# Patient Record
Sex: Female | Born: 1945 | Race: White | Hispanic: No | Smoking: Never smoker
Health system: Southern US, Community
[De-identification: ages and names within clinical notes are randomized; demographics above are authoritative.]

## PROBLEM LIST (undated history)

## (undated) DIAGNOSIS — I5030 Unspecified diastolic (congestive) heart failure: Secondary | ICD-10-CM

## (undated) DIAGNOSIS — F419 Anxiety disorder, unspecified: Secondary | ICD-10-CM

## (undated) DIAGNOSIS — I10 Essential (primary) hypertension: Secondary | ICD-10-CM

## (undated) DIAGNOSIS — M109 Gout, unspecified: Secondary | ICD-10-CM

## (undated) DIAGNOSIS — E119 Type 2 diabetes mellitus without complications: Secondary | ICD-10-CM

## (undated) DIAGNOSIS — I739 Peripheral vascular disease, unspecified: Secondary | ICD-10-CM

## (undated) DIAGNOSIS — M1990 Unspecified osteoarthritis, unspecified site: Secondary | ICD-10-CM

## (undated) DIAGNOSIS — R519 Headache, unspecified: Secondary | ICD-10-CM

## (undated) DIAGNOSIS — N186 End stage renal disease: Secondary | ICD-10-CM

## (undated) DIAGNOSIS — I499 Cardiac arrhythmia, unspecified: Secondary | ICD-10-CM

## (undated) DIAGNOSIS — D649 Anemia, unspecified: Secondary | ICD-10-CM

## (undated) DIAGNOSIS — I480 Paroxysmal atrial fibrillation: Secondary | ICD-10-CM

## (undated) DIAGNOSIS — C9110 Chronic lymphocytic leukemia of B-cell type not having achieved remission: Secondary | ICD-10-CM

## (undated) DIAGNOSIS — E785 Hyperlipidemia, unspecified: Secondary | ICD-10-CM

## (undated) DIAGNOSIS — H269 Unspecified cataract: Secondary | ICD-10-CM

## (undated) DIAGNOSIS — K219 Gastro-esophageal reflux disease without esophagitis: Secondary | ICD-10-CM

## (undated) DIAGNOSIS — I639 Cerebral infarction, unspecified: Secondary | ICD-10-CM

## (undated) DIAGNOSIS — Z794 Long term (current) use of insulin: Secondary | ICD-10-CM

## (undated) DIAGNOSIS — Z8719 Personal history of other diseases of the digestive system: Secondary | ICD-10-CM

## (undated) DIAGNOSIS — N189 Chronic kidney disease, unspecified: Secondary | ICD-10-CM

## (undated) DIAGNOSIS — J189 Pneumonia, unspecified organism: Secondary | ICD-10-CM

## (undated) DIAGNOSIS — I219 Acute myocardial infarction, unspecified: Secondary | ICD-10-CM

## (undated) DIAGNOSIS — A0472 Enterocolitis due to Clostridium difficile, not specified as recurrent: Secondary | ICD-10-CM

## (undated) DIAGNOSIS — IMO0001 Reserved for inherently not codable concepts without codable children: Secondary | ICD-10-CM

## (undated) DIAGNOSIS — N393 Stress incontinence (female) (male): Secondary | ICD-10-CM

## (undated) DIAGNOSIS — R51 Headache: Secondary | ICD-10-CM

## (undated) DIAGNOSIS — I2510 Atherosclerotic heart disease of native coronary artery without angina pectoris: Secondary | ICD-10-CM

## (undated) DIAGNOSIS — R011 Cardiac murmur, unspecified: Secondary | ICD-10-CM

## (undated) HISTORY — PX: HEEL SPUR EXCISION: SHX1733

## (undated) HISTORY — PX: SHOULDER OPEN ROTATOR CUFF REPAIR: SHX2407

## (undated) HISTORY — DX: Unspecified cataract: H26.9

## (undated) HISTORY — DX: Unspecified diastolic (congestive) heart failure: I50.30

## (undated) HISTORY — PX: ANGIOPLASTY / STENTING ILIAC: SUR31

## (undated) HISTORY — DX: Anxiety disorder, unspecified: F41.9

## (undated) HISTORY — DX: Chronic kidney disease, unspecified: N18.9

## (undated) HISTORY — DX: Atherosclerotic heart disease of native coronary artery without angina pectoris: I25.10

## (undated) HISTORY — DX: Anemia, unspecified: D64.9

## (undated) HISTORY — DX: Paroxysmal atrial fibrillation: I48.0

## (undated) HISTORY — DX: Peripheral vascular disease, unspecified: I73.9

## (undated) HISTORY — PX: CATARACT EXTRACTION W/ INTRAOCULAR LENS  IMPLANT, BILATERAL: SHX1307

## (undated) HISTORY — PX: TUBAL LIGATION: SHX77

## (undated) HISTORY — DX: Chronic lymphocytic leukemia of B-cell type not having achieved remission: C91.10

## (undated) HISTORY — DX: Essential (primary) hypertension: I10

## (undated) HISTORY — DX: Hyperlipidemia, unspecified: E78.5

---
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
## 1991-01-25 HISTORY — PX: CHOLECYSTECTOMY: SHX55

## 1994-10-25 HISTORY — PX: CORONARY ARTERY BYPASS GRAFT: SHX141

## 1997-12-15 ENCOUNTER — Encounter: Admission: RE | Admit: 1997-12-15 | Discharge: 1997-12-15 | Payer: Self-pay | Admitting: Family Medicine

## 1998-01-09 ENCOUNTER — Encounter: Admission: RE | Admit: 1998-01-09 | Discharge: 1998-01-09 | Payer: Self-pay | Admitting: Family Medicine

## 1998-01-30 ENCOUNTER — Encounter: Admission: RE | Admit: 1998-01-30 | Discharge: 1998-01-30 | Payer: Self-pay | Admitting: Family Medicine

## 1998-03-23 ENCOUNTER — Encounter: Admission: RE | Admit: 1998-03-23 | Discharge: 1998-03-23 | Payer: Self-pay | Admitting: Family Medicine

## 1998-07-06 ENCOUNTER — Encounter: Admission: RE | Admit: 1998-07-06 | Discharge: 1998-07-06 | Payer: Self-pay | Admitting: Family Medicine

## 1998-07-14 ENCOUNTER — Encounter: Admission: RE | Admit: 1998-07-14 | Discharge: 1998-07-14 | Payer: Self-pay | Admitting: Family Medicine

## 1998-08-13 ENCOUNTER — Encounter: Admission: RE | Admit: 1998-08-13 | Discharge: 1998-08-13 | Payer: Self-pay | Admitting: Family Medicine

## 1998-09-22 ENCOUNTER — Encounter: Admission: RE | Admit: 1998-09-22 | Discharge: 1998-09-22 | Payer: Self-pay | Admitting: Sports Medicine

## 1998-09-22 ENCOUNTER — Ambulatory Visit (HOSPITAL_COMMUNITY): Admission: RE | Admit: 1998-09-22 | Discharge: 1998-09-22 | Payer: Self-pay | Admitting: Sports Medicine

## 1998-10-26 ENCOUNTER — Encounter: Payer: Self-pay | Admitting: Gastroenterology

## 1998-10-26 ENCOUNTER — Ambulatory Visit (HOSPITAL_COMMUNITY): Admission: RE | Admit: 1998-10-26 | Discharge: 1998-10-26 | Payer: Self-pay | Admitting: Gastroenterology

## 1998-12-23 ENCOUNTER — Encounter: Admission: RE | Admit: 1998-12-23 | Discharge: 1998-12-23 | Payer: Self-pay | Admitting: Family Medicine

## 1999-01-04 ENCOUNTER — Encounter: Payer: Self-pay | Admitting: Gastroenterology

## 1999-01-04 ENCOUNTER — Ambulatory Visit (HOSPITAL_COMMUNITY): Admission: RE | Admit: 1999-01-04 | Discharge: 1999-01-04 | Payer: Self-pay | Admitting: Gastroenterology

## 1999-01-27 ENCOUNTER — Encounter: Admission: RE | Admit: 1999-01-27 | Discharge: 1999-01-27 | Payer: Self-pay | Admitting: Family Medicine

## 1999-01-29 ENCOUNTER — Encounter: Admission: RE | Admit: 1999-01-29 | Discharge: 1999-01-29 | Payer: Self-pay | Admitting: Family Medicine

## 1999-02-08 ENCOUNTER — Encounter: Admission: RE | Admit: 1999-02-08 | Discharge: 1999-02-08 | Payer: Self-pay | Admitting: Family Medicine

## 1999-02-26 ENCOUNTER — Encounter: Admission: RE | Admit: 1999-02-26 | Discharge: 1999-02-26 | Payer: Self-pay | Admitting: Family Medicine

## 1999-03-12 ENCOUNTER — Encounter: Payer: Self-pay | Admitting: Sports Medicine

## 1999-03-12 ENCOUNTER — Encounter: Admission: RE | Admit: 1999-03-12 | Discharge: 1999-03-12 | Payer: Self-pay | Admitting: Family Medicine

## 1999-03-12 ENCOUNTER — Encounter: Admission: RE | Admit: 1999-03-12 | Discharge: 1999-03-12 | Payer: Self-pay | Admitting: Sports Medicine

## 1999-03-30 ENCOUNTER — Encounter: Payer: Self-pay | Admitting: Orthopaedic Surgery

## 1999-03-30 ENCOUNTER — Ambulatory Visit (HOSPITAL_COMMUNITY): Admission: RE | Admit: 1999-03-30 | Discharge: 1999-03-30 | Payer: Self-pay | Admitting: Orthopaedic Surgery

## 1999-12-10 ENCOUNTER — Encounter: Admission: RE | Admit: 1999-12-10 | Discharge: 1999-12-10 | Payer: Self-pay | Admitting: Family Medicine

## 2000-03-14 ENCOUNTER — Encounter: Payer: Self-pay | Admitting: Family Medicine

## 2000-03-14 ENCOUNTER — Encounter: Admission: RE | Admit: 2000-03-14 | Discharge: 2000-03-14 | Payer: Self-pay | Admitting: Family Medicine

## 2000-03-15 ENCOUNTER — Inpatient Hospital Stay (HOSPITAL_COMMUNITY): Admission: EM | Admit: 2000-03-15 | Discharge: 2000-03-16 | Payer: Self-pay | Admitting: Emergency Medicine

## 2000-03-15 ENCOUNTER — Encounter: Payer: Self-pay | Admitting: Emergency Medicine

## 2000-03-24 ENCOUNTER — Encounter: Admission: RE | Admit: 2000-03-24 | Discharge: 2000-03-24 | Payer: Self-pay | Admitting: Family Medicine

## 2000-06-05 ENCOUNTER — Encounter: Admission: RE | Admit: 2000-06-05 | Discharge: 2000-06-05 | Payer: Self-pay | Admitting: Family Medicine

## 2000-08-04 ENCOUNTER — Encounter: Admission: RE | Admit: 2000-08-04 | Discharge: 2000-11-02 | Payer: Self-pay | Admitting: Orthopedic Surgery

## 2000-10-02 ENCOUNTER — Encounter: Admission: RE | Admit: 2000-10-02 | Discharge: 2000-10-02 | Payer: Self-pay | Admitting: Family Medicine

## 2000-11-03 ENCOUNTER — Encounter: Admission: RE | Admit: 2000-11-03 | Discharge: 2000-11-14 | Payer: Self-pay | Admitting: Orthopedic Surgery

## 2000-11-20 ENCOUNTER — Encounter: Admission: RE | Admit: 2000-11-20 | Discharge: 2000-11-20 | Payer: Self-pay | Admitting: Family Medicine

## 2000-11-28 ENCOUNTER — Encounter: Admission: RE | Admit: 2000-11-28 | Discharge: 2000-11-28 | Payer: Self-pay | Admitting: Sports Medicine

## 2000-12-05 ENCOUNTER — Encounter: Admission: RE | Admit: 2000-12-05 | Discharge: 2000-12-05 | Payer: Self-pay | Admitting: Family Medicine

## 2000-12-12 ENCOUNTER — Encounter: Admission: RE | Admit: 2000-12-12 | Discharge: 2000-12-12 | Payer: Self-pay | Admitting: Family Medicine

## 2001-04-02 ENCOUNTER — Encounter: Admission: RE | Admit: 2001-04-02 | Discharge: 2001-04-02 | Payer: Self-pay | Admitting: Family Medicine

## 2001-04-10 ENCOUNTER — Encounter: Admission: RE | Admit: 2001-04-10 | Discharge: 2001-06-15 | Payer: Self-pay | Admitting: Orthopedic Surgery

## 2001-04-13 ENCOUNTER — Encounter: Admission: RE | Admit: 2001-04-13 | Discharge: 2001-04-13 | Payer: Self-pay | Admitting: Family Medicine

## 2001-04-27 ENCOUNTER — Encounter: Admission: RE | Admit: 2001-04-27 | Discharge: 2001-04-27 | Payer: Self-pay | Admitting: Family Medicine

## 2001-04-27 ENCOUNTER — Encounter: Payer: Self-pay | Admitting: Sports Medicine

## 2001-11-19 ENCOUNTER — Encounter: Admission: RE | Admit: 2001-11-19 | Discharge: 2001-11-19 | Payer: Self-pay | Admitting: Family Medicine

## 2001-11-26 ENCOUNTER — Encounter: Admission: RE | Admit: 2001-11-26 | Discharge: 2001-11-26 | Payer: Self-pay | Admitting: Family Medicine

## 2002-02-14 ENCOUNTER — Encounter: Admission: RE | Admit: 2002-02-14 | Discharge: 2002-02-14 | Payer: Self-pay | Admitting: Family Medicine

## 2002-02-21 ENCOUNTER — Encounter: Admission: RE | Admit: 2002-02-21 | Discharge: 2002-02-21 | Payer: Self-pay | Admitting: Family Medicine

## 2002-02-28 ENCOUNTER — Encounter: Admission: RE | Admit: 2002-02-28 | Discharge: 2002-02-28 | Payer: Self-pay | Admitting: Family Medicine

## 2002-03-08 ENCOUNTER — Encounter: Admission: RE | Admit: 2002-03-08 | Discharge: 2002-03-08 | Payer: Self-pay | Admitting: Family Medicine

## 2002-03-15 ENCOUNTER — Encounter: Admission: RE | Admit: 2002-03-15 | Discharge: 2002-03-15 | Payer: Self-pay | Admitting: Family Medicine

## 2002-05-13 ENCOUNTER — Encounter: Admission: RE | Admit: 2002-05-13 | Discharge: 2002-05-13 | Payer: Self-pay | Admitting: Family Medicine

## 2002-06-25 ENCOUNTER — Ambulatory Visit (HOSPITAL_COMMUNITY): Admission: EM | Admit: 2002-06-25 | Discharge: 2002-06-25 | Payer: Self-pay | Admitting: Emergency Medicine

## 2002-08-01 ENCOUNTER — Encounter: Admission: RE | Admit: 2002-08-01 | Discharge: 2002-08-01 | Payer: Self-pay | Admitting: Family Medicine

## 2002-08-26 ENCOUNTER — Encounter: Admission: RE | Admit: 2002-08-26 | Discharge: 2002-08-26 | Payer: Self-pay | Admitting: Sports Medicine

## 2002-08-28 ENCOUNTER — Encounter: Admission: RE | Admit: 2002-08-28 | Discharge: 2002-08-28 | Payer: Self-pay | Admitting: Sports Medicine

## 2002-08-28 ENCOUNTER — Encounter: Payer: Self-pay | Admitting: Sports Medicine

## 2002-09-03 ENCOUNTER — Encounter: Admission: RE | Admit: 2002-09-03 | Discharge: 2002-09-03 | Payer: Self-pay | Admitting: Family Medicine

## 2002-09-27 ENCOUNTER — Encounter: Admission: RE | Admit: 2002-09-27 | Discharge: 2002-09-27 | Payer: Self-pay | Admitting: Family Medicine

## 2002-11-19 ENCOUNTER — Encounter: Admission: RE | Admit: 2002-11-19 | Discharge: 2002-11-19 | Payer: Self-pay | Admitting: Sports Medicine

## 2002-12-23 ENCOUNTER — Encounter: Admission: RE | Admit: 2002-12-23 | Discharge: 2002-12-23 | Payer: Self-pay | Admitting: Sports Medicine

## 2002-12-23 ENCOUNTER — Encounter: Payer: Self-pay | Admitting: Family Medicine

## 2003-01-07 ENCOUNTER — Encounter: Admission: RE | Admit: 2003-01-07 | Discharge: 2003-01-07 | Payer: Self-pay | Admitting: Sports Medicine

## 2003-04-25 ENCOUNTER — Encounter: Admission: RE | Admit: 2003-04-25 | Discharge: 2003-04-25 | Payer: Self-pay | Admitting: Family Medicine

## 2003-08-18 ENCOUNTER — Encounter: Admission: RE | Admit: 2003-08-18 | Discharge: 2003-08-18 | Payer: Self-pay | Admitting: Family Medicine

## 2003-11-10 ENCOUNTER — Ambulatory Visit: Payer: Self-pay | Admitting: Sports Medicine

## 2004-04-20 ENCOUNTER — Ambulatory Visit: Payer: Self-pay | Admitting: Oncology

## 2004-04-26 ENCOUNTER — Ambulatory Visit: Payer: Self-pay | Admitting: Family Medicine

## 2004-05-06 ENCOUNTER — Ambulatory Visit: Payer: Self-pay | Admitting: Family Medicine

## 2004-05-11 ENCOUNTER — Ambulatory Visit: Payer: Self-pay | Admitting: Family Medicine

## 2004-05-18 ENCOUNTER — Encounter: Admission: RE | Admit: 2004-05-18 | Discharge: 2004-05-18 | Payer: Self-pay | Admitting: Family Medicine

## 2004-05-18 ENCOUNTER — Ambulatory Visit: Payer: Self-pay | Admitting: Family Medicine

## 2005-03-18 ENCOUNTER — Ambulatory Visit: Payer: Self-pay | Admitting: Family Medicine

## 2005-09-24 ENCOUNTER — Encounter (INDEPENDENT_AMBULATORY_CARE_PROVIDER_SITE_OTHER): Payer: Self-pay | Admitting: *Deleted

## 2005-10-13 ENCOUNTER — Encounter: Admission: RE | Admit: 2005-10-13 | Discharge: 2005-10-13 | Payer: Self-pay | Admitting: Family Medicine

## 2005-10-17 ENCOUNTER — Encounter: Payer: Self-pay | Admitting: Family Medicine

## 2005-10-17 ENCOUNTER — Ambulatory Visit: Payer: Self-pay | Admitting: Family Medicine

## 2005-10-18 ENCOUNTER — Encounter: Payer: Self-pay | Admitting: Family Medicine

## 2005-10-18 LAB — CONVERTED CEMR LAB
BUN: 42 mg/dL
CO2: 23 meq/L
Chloride: 107 meq/L
Creatinine, Ser: 1.7 mg/dL

## 2005-10-19 ENCOUNTER — Ambulatory Visit: Payer: Self-pay | Admitting: Family Medicine

## 2005-11-21 ENCOUNTER — Ambulatory Visit: Payer: Self-pay | Admitting: Family Medicine

## 2005-12-21 ENCOUNTER — Emergency Department (HOSPITAL_COMMUNITY): Admission: EM | Admit: 2005-12-21 | Discharge: 2005-12-21 | Payer: Self-pay | Admitting: Family Medicine

## 2006-03-23 DIAGNOSIS — K589 Irritable bowel syndrome without diarrhea: Secondary | ICD-10-CM | POA: Insufficient documentation

## 2006-03-23 DIAGNOSIS — M159 Polyosteoarthritis, unspecified: Secondary | ICD-10-CM | POA: Insufficient documentation

## 2006-03-23 DIAGNOSIS — E7800 Pure hypercholesterolemia, unspecified: Secondary | ICD-10-CM | POA: Insufficient documentation

## 2006-03-23 DIAGNOSIS — N819 Female genital prolapse, unspecified: Secondary | ICD-10-CM | POA: Insufficient documentation

## 2006-03-23 DIAGNOSIS — I10 Essential (primary) hypertension: Secondary | ICD-10-CM | POA: Insufficient documentation

## 2006-03-23 DIAGNOSIS — K2100 Gastro-esophageal reflux disease with esophagitis, without bleeding: Secondary | ICD-10-CM | POA: Insufficient documentation

## 2006-03-23 DIAGNOSIS — E118 Type 2 diabetes mellitus with unspecified complications: Secondary | ICD-10-CM | POA: Insufficient documentation

## 2006-03-23 DIAGNOSIS — I2510 Atherosclerotic heart disease of native coronary artery without angina pectoris: Secondary | ICD-10-CM | POA: Insufficient documentation

## 2006-03-23 DIAGNOSIS — E669 Obesity, unspecified: Secondary | ICD-10-CM | POA: Insufficient documentation

## 2006-03-24 ENCOUNTER — Encounter (INDEPENDENT_AMBULATORY_CARE_PROVIDER_SITE_OTHER): Payer: Self-pay | Admitting: *Deleted

## 2006-10-13 ENCOUNTER — Ambulatory Visit: Payer: Self-pay | Admitting: Family Medicine

## 2006-10-13 DIAGNOSIS — C9110 Chronic lymphocytic leukemia of B-cell type not having achieved remission: Secondary | ICD-10-CM

## 2006-10-13 HISTORY — DX: Chronic lymphocytic leukemia of B-cell type not having achieved remission: C91.10

## 2006-10-16 LAB — CONVERTED CEMR LAB
HCT: 33.6 % — ABNORMAL LOW (ref 36.0–46.0)
Hemoglobin: 10.3 g/dL — ABNORMAL LOW (ref 12.0–15.0)
MCV: 89.8 fL (ref 78.0–100.0)
Platelets: 378 10*3/uL (ref 150–400)
RDW: 16.8 % — ABNORMAL HIGH (ref 11.5–14.0)
WBC: 66.5 10*3/uL (ref 4.0–10.5)

## 2006-10-18 ENCOUNTER — Encounter: Admission: RE | Admit: 2006-10-18 | Discharge: 2006-10-18 | Payer: Self-pay | Admitting: Family Medicine

## 2006-10-20 ENCOUNTER — Encounter: Payer: Self-pay | Admitting: Family Medicine

## 2006-11-16 ENCOUNTER — Ambulatory Visit: Payer: Self-pay | Admitting: Family Medicine

## 2007-01-19 ENCOUNTER — Telehealth: Payer: Self-pay | Admitting: Family Medicine

## 2007-05-30 ENCOUNTER — Telehealth: Payer: Self-pay | Admitting: Family Medicine

## 2007-06-21 ENCOUNTER — Telehealth: Payer: Self-pay | Admitting: *Deleted

## 2007-06-22 ENCOUNTER — Encounter: Payer: Self-pay | Admitting: *Deleted

## 2007-06-22 ENCOUNTER — Ambulatory Visit: Payer: Self-pay | Admitting: Family Medicine

## 2007-06-22 LAB — CONVERTED CEMR LAB
Casts: >20 /lpf
Ketones, urine, test strip: NEGATIVE
Nitrite: NEGATIVE
Protein, U semiquant: >=300
Urobilinogen, UA: 0.2

## 2007-06-27 ENCOUNTER — Encounter: Payer: Self-pay | Admitting: Family Medicine

## 2007-06-27 ENCOUNTER — Ambulatory Visit: Payer: Self-pay | Admitting: Family Medicine

## 2007-06-27 LAB — CONVERTED CEMR LAB
CO2: 19 meq/L (ref 19–32)
Calcium: 8.9 mg/dL (ref 8.4–10.5)
Chloride: 110 meq/L (ref 96–112)
Glucose, Bld: 176 mg/dL — ABNORMAL HIGH (ref 70–99)
MCV: 90.6 fL (ref 78.0–100.0)
Platelets: 363 10*3/uL (ref 150–400)
Potassium: 6.1 meq/L — ABNORMAL HIGH (ref 3.5–5.3)
RBC: 3.6 M/uL — ABNORMAL LOW (ref 3.87–5.11)
Sodium: 139 meq/L (ref 135–145)
WBC: 64.3 10*3/uL (ref 4.0–10.5)

## 2007-06-28 ENCOUNTER — Encounter: Payer: Self-pay | Admitting: Family Medicine

## 2007-06-28 ENCOUNTER — Telehealth: Payer: Self-pay | Admitting: Family Medicine

## 2007-07-13 ENCOUNTER — Encounter: Admission: RE | Admit: 2007-07-13 | Discharge: 2007-07-13 | Payer: Self-pay | Admitting: Family Medicine

## 2007-07-13 ENCOUNTER — Ambulatory Visit: Payer: Self-pay | Admitting: Family Medicine

## 2007-07-13 DIAGNOSIS — Z992 Dependence on renal dialysis: Secondary | ICD-10-CM

## 2007-07-13 DIAGNOSIS — N186 End stage renal disease: Secondary | ICD-10-CM | POA: Insufficient documentation

## 2007-10-24 ENCOUNTER — Encounter: Admission: RE | Admit: 2007-10-24 | Discharge: 2007-10-24 | Payer: Self-pay | Admitting: Family Medicine

## 2007-10-26 ENCOUNTER — Ambulatory Visit: Payer: Self-pay | Admitting: Family Medicine

## 2007-11-05 ENCOUNTER — Encounter: Payer: Self-pay | Admitting: Family Medicine

## 2007-11-26 ENCOUNTER — Encounter: Payer: Self-pay | Admitting: Family Medicine

## 2007-11-26 LAB — CONVERTED CEMR LAB: HDL: 40 mg/dL

## 2007-12-31 ENCOUNTER — Encounter: Payer: Self-pay | Admitting: Family Medicine

## 2008-02-05 ENCOUNTER — Emergency Department (HOSPITAL_COMMUNITY): Admission: EM | Admit: 2008-02-05 | Discharge: 2008-02-05 | Payer: Self-pay | Admitting: *Deleted

## 2008-02-11 ENCOUNTER — Telehealth (INDEPENDENT_AMBULATORY_CARE_PROVIDER_SITE_OTHER): Payer: Self-pay | Admitting: *Deleted

## 2008-02-11 ENCOUNTER — Telehealth: Payer: Self-pay | Admitting: Family Medicine

## 2008-05-12 ENCOUNTER — Telehealth (INDEPENDENT_AMBULATORY_CARE_PROVIDER_SITE_OTHER): Payer: Self-pay | Admitting: *Deleted

## 2008-05-15 ENCOUNTER — Encounter: Payer: Self-pay | Admitting: Family Medicine

## 2008-05-15 LAB — CONVERTED CEMR LAB
HCT: 29.5 %
Lymphocytes Relative: 83.8 %
Lymphs Abs: 70.7 10*3/uL
Neutro Abs: 5.7 10*3/uL
Neutrophils Relative %: 6.7 %
Platelets: 328 10*3/uL
RDW: 15.9 %
WBC: 84.4 10*3/uL

## 2008-05-22 ENCOUNTER — Encounter: Payer: Self-pay | Admitting: Family Medicine

## 2008-05-22 ENCOUNTER — Ambulatory Visit (HOSPITAL_COMMUNITY): Admission: RE | Admit: 2008-05-22 | Discharge: 2008-05-22 | Payer: Self-pay | Admitting: Interventional Cardiology

## 2008-05-29 ENCOUNTER — Ambulatory Visit (HOSPITAL_COMMUNITY): Admission: RE | Admit: 2008-05-29 | Discharge: 2008-05-29 | Payer: Self-pay | Admitting: Interventional Cardiology

## 2008-06-18 ENCOUNTER — Ambulatory Visit: Payer: Self-pay | Admitting: Family Medicine

## 2008-06-18 DIAGNOSIS — I739 Peripheral vascular disease, unspecified: Secondary | ICD-10-CM | POA: Insufficient documentation

## 2008-06-19 ENCOUNTER — Encounter: Payer: Self-pay | Admitting: Family Medicine

## 2008-06-19 ENCOUNTER — Telehealth: Payer: Self-pay | Admitting: Family Medicine

## 2008-06-20 LAB — CONVERTED CEMR LAB
Alkaline Phosphatase: 71 units/L (ref 39–117)
BUN: 34 mg/dL — ABNORMAL HIGH (ref 6–23)
CO2: 20 meq/L (ref 19–32)
Glucose, Bld: 154 mg/dL — ABNORMAL HIGH (ref 70–99)
HCT: 29.9 % — ABNORMAL LOW (ref 36.0–46.0)
Hemoglobin: 8.7 g/dL — ABNORMAL LOW (ref 12.0–15.0)
MCHC: 29.1 g/dL — ABNORMAL LOW (ref 30.0–36.0)
MCV: 88.5 fL (ref 78.0–100.0)
RBC: 3.38 M/uL — ABNORMAL LOW (ref 3.87–5.11)
TSH: 2.095 microintl units/mL (ref 0.350–4.500)
Total Bilirubin: 0.2 mg/dL — ABNORMAL LOW (ref 0.3–1.2)
Total Protein: 6 g/dL (ref 6.0–8.3)
WBC: 64.9 10*3/uL (ref 4.0–10.5)

## 2008-07-08 ENCOUNTER — Telehealth: Payer: Self-pay | Admitting: Family Medicine

## 2008-10-13 ENCOUNTER — Telehealth: Payer: Self-pay | Admitting: Family Medicine

## 2008-10-13 ENCOUNTER — Ambulatory Visit: Payer: Self-pay | Admitting: Family Medicine

## 2008-10-13 ENCOUNTER — Encounter: Payer: Self-pay | Admitting: Family Medicine

## 2008-10-13 LAB — CONVERTED CEMR LAB
Bilirubin Urine: NEGATIVE
Blood in Urine, dipstick: NEGATIVE
Glucose, Urine, Semiquant: NEGATIVE
Hgb A1c MFr Bld: 7.5 %
Ketones, urine, test strip: NEGATIVE
Nitrite: NEGATIVE
Protein, U semiquant: >=300
Specific Gravity, Urine: 1.025
Urobilinogen, UA: 0.2
pH: 5

## 2008-10-14 LAB — CONVERTED CEMR LAB
BUN: 32 mg/dL — ABNORMAL HIGH (ref 6–23)
CO2: 21 meq/L (ref 19–32)
Calcium: 9 mg/dL (ref 8.4–10.5)
Chloride: 108 meq/L (ref 96–112)
Creatinine, Ser: 1.46 mg/dL — ABNORMAL HIGH (ref 0.40–1.20)
Glucose, Bld: 190 mg/dL — ABNORMAL HIGH (ref 70–99)
HCT: 31.7 % — ABNORMAL LOW (ref 36.0–46.0)
Hemoglobin: 9.5 g/dL — ABNORMAL LOW (ref 12.0–15.0)
MCHC: 30 g/dL (ref 30.0–36.0)
MCV: 88.1 fL (ref 78.0–100.0)
Platelets: 349 10*3/uL (ref 150–400)
Potassium: 5 meq/L (ref 3.5–5.3)
RBC: 3.6 M/uL — ABNORMAL LOW (ref 3.87–5.11)
RDW: 17.8 % — ABNORMAL HIGH (ref 11.5–15.5)
Sodium: 142 meq/L (ref 135–145)
WBC: 63.8 10*3/uL (ref 4.0–10.5)

## 2008-10-15 ENCOUNTER — Encounter: Admission: RE | Admit: 2008-10-15 | Discharge: 2008-10-15 | Payer: Self-pay | Admitting: Family Medicine

## 2008-10-15 ENCOUNTER — Ambulatory Visit (HOSPITAL_COMMUNITY): Admission: RE | Admit: 2008-10-15 | Discharge: 2008-10-15 | Payer: Self-pay | Admitting: Family Medicine

## 2008-10-15 ENCOUNTER — Ambulatory Visit: Payer: Self-pay | Admitting: Family Medicine

## 2008-10-16 ENCOUNTER — Telehealth: Payer: Self-pay | Admitting: Family Medicine

## 2008-10-17 ENCOUNTER — Telehealth: Payer: Self-pay | Admitting: Family Medicine

## 2008-10-22 ENCOUNTER — Ambulatory Visit: Payer: Self-pay | Admitting: Family Medicine

## 2008-10-24 ENCOUNTER — Telehealth (INDEPENDENT_AMBULATORY_CARE_PROVIDER_SITE_OTHER): Payer: Self-pay | Admitting: *Deleted

## 2008-10-24 ENCOUNTER — Encounter: Payer: Self-pay | Admitting: Family Medicine

## 2008-10-24 LAB — CONVERTED CEMR LAB
ALT: 20 units/L (ref 0–35)
AST: 24 units/L (ref 0–37)
Alkaline Phosphatase: 84 units/L (ref 39–117)
Basophils Absolute: 0.6 10*3/uL — ABNORMAL HIGH (ref 0.0–0.1)
Basophils Relative: 1 % (ref 0–1)
Glucose, Bld: 248 mg/dL — ABNORMAL HIGH (ref 70–99)
Lymphocytes Relative: 79 % — ABNORMAL HIGH (ref 12–46)
MCHC: 30.5 g/dL (ref 30.0–36.0)
Neutro Abs: 12 10*3/uL — ABNORMAL HIGH (ref 1.7–7.7)
Neutrophils Relative %: 19 % — ABNORMAL LOW (ref 43–77)
Platelets: 316 10*3/uL (ref 150–400)
RDW: 17.6 % — ABNORMAL HIGH (ref 11.5–15.5)
Sodium: 141 meq/L (ref 135–145)
Total Bilirubin: 0.2 mg/dL — ABNORMAL LOW (ref 0.3–1.2)
Total Protein: 5.5 g/dL — ABNORMAL LOW (ref 6.0–8.3)

## 2008-10-29 ENCOUNTER — Telehealth: Payer: Self-pay | Admitting: Family Medicine

## 2008-10-29 ENCOUNTER — Ambulatory Visit: Payer: Self-pay | Admitting: Family Medicine

## 2008-10-29 DIAGNOSIS — I495 Sick sinus syndrome: Secondary | ICD-10-CM | POA: Insufficient documentation

## 2008-11-04 ENCOUNTER — Ambulatory Visit (HOSPITAL_COMMUNITY): Admission: RE | Admit: 2008-11-04 | Discharge: 2008-11-04 | Payer: Self-pay | Admitting: Family Medicine

## 2008-11-04 ENCOUNTER — Encounter: Payer: Self-pay | Admitting: Family Medicine

## 2008-11-04 ENCOUNTER — Telehealth (INDEPENDENT_AMBULATORY_CARE_PROVIDER_SITE_OTHER): Payer: Self-pay | Admitting: *Deleted

## 2008-11-07 ENCOUNTER — Telehealth: Payer: Self-pay | Admitting: Family Medicine

## 2008-11-07 ENCOUNTER — Ambulatory Visit (HOSPITAL_COMMUNITY): Admission: RE | Admit: 2008-11-07 | Discharge: 2008-11-07 | Payer: Self-pay | Admitting: Family Medicine

## 2008-11-11 ENCOUNTER — Ambulatory Visit: Payer: Self-pay | Admitting: Family Medicine

## 2008-11-11 ENCOUNTER — Encounter: Payer: Self-pay | Admitting: Family Medicine

## 2008-11-11 ENCOUNTER — Telehealth: Payer: Self-pay | Admitting: Family Medicine

## 2008-11-11 LAB — CONVERTED CEMR LAB
Alkaline Phosphatase: 99 units/L (ref 39–117)
BUN: 23 mg/dL (ref 6–23)
Glucose, Bld: 288 mg/dL — ABNORMAL HIGH (ref 70–99)
MCHC: 28.7 g/dL — ABNORMAL LOW (ref 30.0–36.0)
MCV: 87.6 fL (ref 78.0–100.0)
RBC: 3.7 M/uL — ABNORMAL LOW (ref 3.87–5.11)
RDW: 17.3 % — ABNORMAL HIGH (ref 11.5–15.5)
Sodium: 138 meq/L (ref 135–145)
Total Bilirubin: 0.2 mg/dL — ABNORMAL LOW (ref 0.3–1.2)
Total Protein: 5.9 g/dL — ABNORMAL LOW (ref 6.0–8.3)

## 2008-11-12 ENCOUNTER — Telehealth: Payer: Self-pay | Admitting: Family Medicine

## 2008-11-12 ENCOUNTER — Encounter: Payer: Self-pay | Admitting: Family Medicine

## 2008-11-17 ENCOUNTER — Ambulatory Visit: Payer: Self-pay | Admitting: Family Medicine

## 2008-11-26 ENCOUNTER — Encounter: Admission: RE | Admit: 2008-11-26 | Discharge: 2008-11-26 | Payer: Self-pay | Admitting: Otolaryngology

## 2008-11-26 ENCOUNTER — Encounter: Payer: Self-pay | Admitting: Family Medicine

## 2008-12-03 ENCOUNTER — Telehealth: Payer: Self-pay | Admitting: Family Medicine

## 2008-12-04 ENCOUNTER — Ambulatory Visit: Payer: Self-pay | Admitting: Family Medicine

## 2008-12-05 LAB — CONVERTED CEMR LAB
CO2: 20 meq/L (ref 19–32)
Cholesterol: 107 mg/dL (ref 0–200)
Creatinine, Ser: 1.7 mg/dL — ABNORMAL HIGH (ref 0.40–1.20)
Glucose, Bld: 192 mg/dL — ABNORMAL HIGH (ref 70–99)
HCT: 30.2 % — ABNORMAL LOW (ref 36.0–46.0)
LDL Cholesterol: 42 mg/dL (ref 0–99)
MCV: 88.3 fL (ref 78.0–100.0)
RBC: 3.42 M/uL — ABNORMAL LOW (ref 3.87–5.11)
Total Bilirubin: 0.2 mg/dL — ABNORMAL LOW (ref 0.3–1.2)
Triglycerides: 157 mg/dL — ABNORMAL HIGH (ref <150)
WBC: 52.6 10*3/uL (ref 4.0–10.5)

## 2008-12-12 ENCOUNTER — Encounter: Admission: RE | Admit: 2008-12-12 | Discharge: 2008-12-12 | Payer: Self-pay | Admitting: Family Medicine

## 2008-12-15 ENCOUNTER — Encounter: Payer: Self-pay | Admitting: Family Medicine

## 2008-12-22 ENCOUNTER — Telehealth: Payer: Self-pay | Admitting: Family Medicine

## 2008-12-23 ENCOUNTER — Telehealth: Payer: Self-pay | Admitting: Family Medicine

## 2008-12-24 ENCOUNTER — Ambulatory Visit: Payer: Self-pay | Admitting: Family Medicine

## 2008-12-24 LAB — CONVERTED CEMR LAB: Hgb A1c MFr Bld: 9.7 %

## 2008-12-25 ENCOUNTER — Telehealth: Payer: Self-pay | Admitting: Family Medicine

## 2008-12-25 ENCOUNTER — Ambulatory Visit: Payer: Self-pay | Admitting: Family Medicine

## 2008-12-25 ENCOUNTER — Encounter: Payer: Self-pay | Admitting: Family Medicine

## 2008-12-25 LAB — CONVERTED CEMR LAB
BUN: 29 mg/dL — ABNORMAL HIGH (ref 6–23)
CO2: 21 meq/L (ref 19–32)
Chloride: 100 meq/L (ref 96–112)
Creatinine, Ser: 1.34 mg/dL — ABNORMAL HIGH (ref 0.40–1.20)
HCT: 29.3 % — ABNORMAL LOW (ref 36.0–46.0)
Hemoglobin: 8.6 g/dL — ABNORMAL LOW (ref 12.0–15.0)
Potassium: 4.9 meq/L (ref 3.5–5.3)
RBC: 3.43 M/uL — ABNORMAL LOW (ref 3.87–5.11)
RDW: 17.2 % — ABNORMAL HIGH (ref 11.5–15.5)
WBC: 100.5 10*3/uL (ref 4.0–10.5)

## 2008-12-31 ENCOUNTER — Telehealth: Payer: Self-pay | Admitting: Family Medicine

## 2009-01-01 ENCOUNTER — Encounter: Payer: Self-pay | Admitting: Family Medicine

## 2009-01-01 ENCOUNTER — Telehealth (INDEPENDENT_AMBULATORY_CARE_PROVIDER_SITE_OTHER): Payer: Self-pay | Admitting: *Deleted

## 2009-01-01 ENCOUNTER — Telehealth: Payer: Self-pay | Admitting: *Deleted

## 2009-01-05 ENCOUNTER — Encounter: Payer: Self-pay | Admitting: Family Medicine

## 2009-01-05 ENCOUNTER — Telehealth: Payer: Self-pay | Admitting: Family Medicine

## 2009-01-07 ENCOUNTER — Inpatient Hospital Stay (HOSPITAL_COMMUNITY): Admission: AD | Admit: 2009-01-07 | Discharge: 2009-01-11 | Payer: Self-pay | Admitting: Family Medicine

## 2009-01-07 ENCOUNTER — Encounter: Payer: Self-pay | Admitting: Family Medicine

## 2009-01-07 ENCOUNTER — Ambulatory Visit: Payer: Self-pay | Admitting: Family Medicine

## 2009-01-08 ENCOUNTER — Ambulatory Visit: Payer: Self-pay | Admitting: Vascular Surgery

## 2009-01-08 ENCOUNTER — Ambulatory Visit: Payer: Self-pay | Admitting: Oncology

## 2009-01-08 ENCOUNTER — Encounter: Payer: Self-pay | Admitting: Family Medicine

## 2009-01-09 ENCOUNTER — Encounter: Payer: Self-pay | Admitting: Family Medicine

## 2009-01-10 ENCOUNTER — Encounter: Payer: Self-pay | Admitting: Family Medicine

## 2009-01-12 ENCOUNTER — Telehealth: Payer: Self-pay | Admitting: Family Medicine

## 2009-01-13 ENCOUNTER — Ambulatory Visit: Payer: Self-pay | Admitting: Family Medicine

## 2009-01-15 ENCOUNTER — Encounter: Payer: Self-pay | Admitting: Family Medicine

## 2009-01-15 LAB — CBC WITH DIFFERENTIAL/PLATELET
MCH: 26.6 pg (ref 25.1–34.0)
MCHC: 30.2 g/dL — ABNORMAL LOW (ref 31.5–36.0)
MCV: 88.1 fL (ref 79.5–101.0)
Platelets: 357 10*3/uL (ref 145–400)
RBC: 3.23 10*6/uL — ABNORMAL LOW (ref 3.70–5.45)
RDW: 20.5 % — ABNORMAL HIGH (ref 11.2–14.5)
WBC: 99 10*3/uL (ref 3.9–10.3)

## 2009-01-15 LAB — MANUAL DIFFERENTIAL
Basophil: 0 % (ref 0–2)
EOS: 0 % (ref 0–7)
LYMPH: 92 % — ABNORMAL HIGH (ref 14–49)
MONO: 1 % (ref 0–14)
Metamyelocytes: 0 % (ref 0–0)
Myelocytes: 0 % (ref 0–0)
PLT EST: ADEQUATE
Variant Lymph: 0 % (ref 0–0)

## 2009-01-16 LAB — COMPREHENSIVE METABOLIC PANEL
ALT: 19 U/L (ref 0–35)
Alkaline Phosphatase: 92 U/L (ref 39–117)
CO2: 25 mEq/L (ref 19–32)
Creatinine, Ser: 2.39 mg/dL — ABNORMAL HIGH (ref 0.40–1.20)
Sodium: 140 mEq/L (ref 135–145)
Total Bilirubin: 0.2 mg/dL — ABNORMAL LOW (ref 0.3–1.2)

## 2009-01-16 LAB — VITAMIN B12: Vitamin B-12: 724 pg/mL (ref 211–911)

## 2009-01-16 LAB — IRON AND TIBC
%SAT: 11 % — ABNORMAL LOW (ref 20–55)
TIBC: 485 ug/dL — ABNORMAL HIGH (ref 250–470)

## 2009-01-16 LAB — DIRECT ANTIGLOBULIN TEST (NOT AT ARMC): DAT (Complement): NEGATIVE

## 2009-01-16 LAB — LACTATE DEHYDROGENASE: LDH: 222 U/L (ref 94–250)

## 2009-01-21 ENCOUNTER — Telehealth: Payer: Self-pay | Admitting: Family Medicine

## 2009-01-30 ENCOUNTER — Ambulatory Visit (HOSPITAL_COMMUNITY): Admission: RE | Admit: 2009-01-30 | Discharge: 2009-01-30 | Payer: Self-pay | Admitting: Oncology

## 2009-02-06 ENCOUNTER — Encounter (INDEPENDENT_AMBULATORY_CARE_PROVIDER_SITE_OTHER): Payer: Self-pay | Admitting: *Deleted

## 2009-02-12 ENCOUNTER — Encounter: Payer: Self-pay | Admitting: *Deleted

## 2009-02-17 ENCOUNTER — Encounter: Payer: Self-pay | Admitting: Family Medicine

## 2009-02-17 ENCOUNTER — Ambulatory Visit: Payer: Self-pay | Admitting: Oncology

## 2009-02-18 ENCOUNTER — Ambulatory Visit: Payer: Self-pay | Admitting: Family Medicine

## 2009-02-18 DIAGNOSIS — I5022 Chronic systolic (congestive) heart failure: Secondary | ICD-10-CM | POA: Insufficient documentation

## 2009-02-25 LAB — COMPREHENSIVE METABOLIC PANEL
CO2: 23 mEq/L (ref 19–32)
Creatinine, Ser: 2 mg/dL — ABNORMAL HIGH (ref 0.40–1.20)
Glucose, Bld: 188 mg/dL — ABNORMAL HIGH (ref 70–99)
Sodium: 140 mEq/L (ref 135–145)
Total Bilirubin: 0.3 mg/dL (ref 0.3–1.2)
Total Protein: 5.9 g/dL — ABNORMAL LOW (ref 6.0–8.3)

## 2009-02-25 LAB — MANUAL DIFFERENTIAL
ALC: 57.9 10*3/uL — ABNORMAL HIGH (ref 0.9–3.3)
Band Neutrophils: 0 % (ref 0–10)
LYMPH: 87 % — ABNORMAL HIGH (ref 14–49)
MONO: 2 % (ref 0–14)
Other Cell: 0 % (ref 0–0)
SEG: 7 % — ABNORMAL LOW (ref 38–77)
Variant Lymph: 0 % (ref 0–0)
nRBC: 0 % (ref 0–0)

## 2009-02-25 LAB — CBC WITH DIFFERENTIAL/PLATELET
HCT: 26.9 % — ABNORMAL LOW (ref 34.8–46.6)
Platelets: 244 10*3/uL (ref 145–400)
WBC: 66.6 10*3/uL (ref 3.9–10.3)

## 2009-02-25 LAB — LACTATE DEHYDROGENASE: LDH: 192 U/L (ref 94–250)

## 2009-02-25 LAB — URIC ACID: Uric Acid, Serum: 4.5 mg/dL (ref 2.4–7.0)

## 2009-03-02 LAB — COMPREHENSIVE METABOLIC PANEL
ALT: 14 U/L (ref 0–35)
AST: 23 U/L (ref 0–37)
Albumin: 3.9 g/dL (ref 3.5–5.2)
Alkaline Phosphatase: 87 U/L (ref 39–117)
Glucose, Bld: 125 mg/dL — ABNORMAL HIGH (ref 70–99)
Potassium: 5.2 mEq/L (ref 3.5–5.3)
Sodium: 143 mEq/L (ref 135–145)
Total Protein: 6.1 g/dL (ref 6.0–8.3)

## 2009-03-02 LAB — CBC WITH DIFFERENTIAL/PLATELET
EOS%: 1.2 % (ref 0.0–7.0)
Eosinophils Absolute: 0.8 10*3/uL — ABNORMAL HIGH (ref 0.0–0.5)
MCV: 87.5 fL (ref 79.5–101.0)
MONO%: 9.4 % (ref 0.0–14.0)
NEUT#: 4.8 10*3/uL (ref 1.5–6.5)
RBC: 3.16 10*6/uL — ABNORMAL LOW (ref 3.70–5.45)
RDW: 20.9 % — ABNORMAL HIGH (ref 11.2–14.5)

## 2009-03-06 ENCOUNTER — Encounter: Payer: Self-pay | Admitting: Family Medicine

## 2009-03-09 LAB — CBC WITH DIFFERENTIAL/PLATELET
HCT: 28.4 % — ABNORMAL LOW (ref 34.8–46.6)
HGB: 8.8 g/dL — ABNORMAL LOW (ref 11.6–15.9)
RDW: 21 % — ABNORMAL HIGH (ref 11.2–14.5)
WBC: 74.7 10*3/uL (ref 3.9–10.3)

## 2009-03-09 LAB — COMPREHENSIVE METABOLIC PANEL
AST: 22 U/L (ref 0–37)
Albumin: 4.1 g/dL (ref 3.5–5.2)
Alkaline Phosphatase: 87 U/L (ref 39–117)
Chloride: 107 mEq/L (ref 96–112)
Glucose, Bld: 147 mg/dL — ABNORMAL HIGH (ref 70–99)
Potassium: 5.3 mEq/L (ref 3.5–5.3)
Sodium: 141 mEq/L (ref 135–145)
Total Protein: 6.1 g/dL (ref 6.0–8.3)

## 2009-03-09 LAB — MANUAL DIFFERENTIAL
ALC: 68.7 10*3/uL — ABNORMAL HIGH (ref 0.9–3.3)
ANC (CHCC manual diff): 2.2 10*3/uL (ref 1.5–6.5)
Band Neutrophils: 0 % (ref 0–10)
Basophil: 1 % (ref 0–2)
Blasts: 0 % (ref 0–0)
Other Cell: 0 % (ref 0–0)
PROMYELO: 0 % (ref 0–0)
SEG: 3 % — ABNORMAL LOW (ref 38–77)
Variant Lymph: 0 % (ref 0–0)
nRBC: 0 % (ref 0–0)

## 2009-03-09 LAB — URIC ACID: Uric Acid, Serum: 3.6 mg/dL (ref 2.4–7.0)

## 2009-03-16 LAB — MANUAL DIFFERENTIAL
Band Neutrophils: 0 % (ref 0–10)
Basophil: 0 % (ref 0–2)
Blasts: 0 % (ref 0–0)
EOS: 0 % (ref 0–7)
LYMPH: 96 % — ABNORMAL HIGH (ref 14–49)
Metamyelocytes: 0 % (ref 0–0)
Other Cell: 0 % (ref 0–0)
PROMYELO: 0 % (ref 0–0)
nRBC: 0 % (ref 0–0)

## 2009-03-16 LAB — CBC WITH DIFFERENTIAL/PLATELET
HCT: 27 % — ABNORMAL LOW (ref 34.8–46.6)
HGB: 8.1 g/dL — ABNORMAL LOW (ref 11.6–15.9)
MCH: 26.5 pg (ref 25.1–34.0)
MCHC: 30 g/dL — ABNORMAL LOW (ref 31.5–36.0)
MCV: 88.2 fL (ref 79.5–101.0)
Platelets: 241 10*3/uL (ref 145–400)
RBC: 3.06 10*6/uL — ABNORMAL LOW (ref 3.70–5.45)
RDW: 19.4 % — ABNORMAL HIGH (ref 11.2–14.5)
WBC: 71.9 10*3/uL (ref 3.9–10.3)
nRBC: 0 % (ref 0–0)

## 2009-03-16 LAB — COMPREHENSIVE METABOLIC PANEL
CO2: 23 mEq/L (ref 19–32)
Chloride: 108 mEq/L (ref 96–112)
Creatinine, Ser: 2.13 mg/dL — ABNORMAL HIGH (ref 0.40–1.20)
Glucose, Bld: 153 mg/dL — ABNORMAL HIGH (ref 70–99)
Sodium: 140 mEq/L (ref 135–145)
Total Bilirubin: 0.3 mg/dL (ref 0.3–1.2)
Total Protein: 5.9 g/dL — ABNORMAL LOW (ref 6.0–8.3)

## 2009-03-16 LAB — URIC ACID: Uric Acid, Serum: 3.7 mg/dL (ref 2.4–7.0)

## 2009-03-23 ENCOUNTER — Encounter: Payer: Self-pay | Admitting: Family Medicine

## 2009-03-23 ENCOUNTER — Ambulatory Visit: Payer: Self-pay | Admitting: Oncology

## 2009-03-23 LAB — COMPREHENSIVE METABOLIC PANEL
Albumin: 4.3 g/dL (ref 3.5–5.2)
Alkaline Phosphatase: 81 U/L (ref 39–117)
BUN: 38 mg/dL — ABNORMAL HIGH (ref 6–23)
Calcium: 9 mg/dL (ref 8.4–10.5)
Chloride: 109 mEq/L (ref 96–112)
Creatinine, Ser: 2.03 mg/dL — ABNORMAL HIGH (ref 0.40–1.20)
Glucose, Bld: 130 mg/dL — ABNORMAL HIGH (ref 70–99)
Potassium: 5.4 mEq/L — ABNORMAL HIGH (ref 3.5–5.3)

## 2009-03-23 LAB — CBC WITH DIFFERENTIAL/PLATELET
HCT: 28.1 % — ABNORMAL LOW (ref 34.8–46.6)
HGB: 8.8 g/dL — ABNORMAL LOW (ref 11.6–15.9)
MCH: 28 pg (ref 25.1–34.0)
MCHC: 31.5 g/dL (ref 31.5–36.0)
MCV: 89.1 fL (ref 79.5–101.0)
Platelets: 278 10*3/uL (ref 145–400)

## 2009-03-23 LAB — MANUAL DIFFERENTIAL
ANC (CHCC manual diff): 5.7 10*3/uL (ref 1.5–6.5)
Band Neutrophils: 0 % (ref 0–10)
Basophil: 0 % (ref 0–2)
Blasts: 0 % (ref 0–0)
EOS: 1 % (ref 0–7)
LYMPH: 90 % — ABNORMAL HIGH (ref 14–49)
MONO: 1 % (ref 0–14)
Metamyelocytes: 0 % (ref 0–0)
nRBC: 0 % (ref 0–0)

## 2009-03-23 LAB — URIC ACID: Uric Acid, Serum: 4.1 mg/dL (ref 2.4–7.0)

## 2009-04-06 LAB — COMPREHENSIVE METABOLIC PANEL
CO2: 23 mEq/L (ref 19–32)
Calcium: 9.3 mg/dL (ref 8.4–10.5)
Creatinine, Ser: 1.8 mg/dL — ABNORMAL HIGH (ref 0.40–1.20)
Glucose, Bld: 129 mg/dL — ABNORMAL HIGH (ref 70–99)
Total Bilirubin: 0.3 mg/dL (ref 0.3–1.2)
Total Protein: 6.4 g/dL (ref 6.0–8.3)

## 2009-04-06 LAB — CBC WITH DIFFERENTIAL/PLATELET
Basophils Absolute: 0.3 10*3/uL — ABNORMAL HIGH (ref 0.0–0.1)
Eosinophils Absolute: 0.6 10*3/uL — ABNORMAL HIGH (ref 0.0–0.5)
HCT: 28.4 % — ABNORMAL LOW (ref 34.8–46.6)
HGB: 9.1 g/dL — ABNORMAL LOW (ref 11.6–15.9)
LYMPH%: 88 % — ABNORMAL HIGH (ref 14.0–49.7)
MCV: 88.7 fL (ref 79.5–101.0)
MONO%: 3.2 % (ref 0.0–14.0)
NEUT#: 4 10*3/uL (ref 1.5–6.5)
Platelets: 248 10*3/uL (ref 145–400)

## 2009-04-06 LAB — URIC ACID: Uric Acid, Serum: 3.4 mg/dL (ref 2.4–7.0)

## 2009-04-17 ENCOUNTER — Ambulatory Visit: Payer: Self-pay | Admitting: Family Medicine

## 2009-04-17 LAB — CBC WITH DIFFERENTIAL/PLATELET
BASO%: 0.2 % (ref 0.0–2.0)
HCT: 28.7 % — ABNORMAL LOW (ref 34.8–46.6)
MCHC: 31.7 g/dL (ref 31.5–36.0)
MONO#: 1.8 10*3/uL — ABNORMAL HIGH (ref 0.1–0.9)
NEUT%: 6.2 % — ABNORMAL LOW (ref 38.4–76.8)
RDW: 19.7 % — ABNORMAL HIGH (ref 11.2–14.5)
WBC: 52.3 10*3/uL (ref 3.9–10.3)
lymph#: 46.7 10*3/uL — ABNORMAL HIGH (ref 0.9–3.3)

## 2009-04-17 LAB — COMPREHENSIVE METABOLIC PANEL
ALT: 18 U/L (ref 0–35)
AST: 25 U/L (ref 0–37)
Albumin: 4.3 g/dL (ref 3.5–5.2)
Alkaline Phosphatase: 78 U/L (ref 39–117)
BUN: 43 mg/dL — ABNORMAL HIGH (ref 6–23)
Calcium: 9.2 mg/dL (ref 8.4–10.5)
Chloride: 105 mEq/L (ref 96–112)
Potassium: 5.1 mEq/L (ref 3.5–5.3)
Sodium: 139 mEq/L (ref 135–145)
Total Protein: 6.3 g/dL (ref 6.0–8.3)

## 2009-04-17 LAB — TECHNOLOGIST REVIEW

## 2009-04-28 ENCOUNTER — Ambulatory Visit: Payer: Self-pay | Admitting: Oncology

## 2009-04-30 LAB — CBC WITH DIFFERENTIAL/PLATELET
BASO%: 0.2 % (ref 0.0–2.0)
EOS%: 0.9 % (ref 0.0–7.0)
MCH: 28.8 pg (ref 25.1–34.0)
MCHC: 32.2 g/dL (ref 31.5–36.0)
MCV: 89.4 fL (ref 79.5–101.0)
MONO%: 3.2 % (ref 0.0–14.0)
RBC: 3.12 10*6/uL — ABNORMAL LOW (ref 3.70–5.45)
RDW: 18.7 % — ABNORMAL HIGH (ref 11.2–14.5)

## 2009-04-30 LAB — COMPREHENSIVE METABOLIC PANEL
ALT: 18 U/L (ref 0–35)
AST: 25 U/L (ref 0–37)
Albumin: 4.4 g/dL (ref 3.5–5.2)
Alkaline Phosphatase: 74 U/L (ref 39–117)
BUN: 39 mg/dL — ABNORMAL HIGH (ref 6–23)
Potassium: 5.2 mEq/L (ref 3.5–5.3)
Sodium: 142 mEq/L (ref 135–145)

## 2009-05-14 LAB — CBC WITH DIFFERENTIAL/PLATELET
Basophils Absolute: 0.2 10*3/uL — ABNORMAL HIGH (ref 0.0–0.1)
EOS%: 0.6 % (ref 0.0–7.0)
HGB: 9.2 g/dL — ABNORMAL LOW (ref 11.6–15.9)
MCH: 29.2 pg (ref 25.1–34.0)
MCV: 89.6 fL (ref 79.5–101.0)
MONO%: 7.2 % (ref 0.0–14.0)
NEUT%: 6 % — ABNORMAL LOW (ref 38.4–76.8)
RDW: 18.3 % — ABNORMAL HIGH (ref 11.2–14.5)

## 2009-05-14 LAB — COMPREHENSIVE METABOLIC PANEL WITH GFR
ALT: 16 U/L (ref 0–35)
AST: 23 U/L (ref 0–37)
Albumin: 4.1 g/dL (ref 3.5–5.2)
Alkaline Phosphatase: 74 U/L (ref 39–117)
BUN: 50 mg/dL — ABNORMAL HIGH (ref 6–23)
CO2: 24 meq/L (ref 19–32)
Calcium: 9.3 mg/dL (ref 8.4–10.5)
Chloride: 107 meq/L (ref 96–112)
Creatinine, Ser: 2.12 mg/dL — ABNORMAL HIGH (ref 0.40–1.20)
Glucose, Bld: 112 mg/dL — ABNORMAL HIGH (ref 70–99)
Potassium: 4.9 meq/L (ref 3.5–5.3)
Sodium: 141 meq/L (ref 135–145)
Total Bilirubin: 0.3 mg/dL (ref 0.3–1.2)
Total Protein: 6.1 g/dL (ref 6.0–8.3)

## 2009-05-18 ENCOUNTER — Encounter: Payer: Self-pay | Admitting: Family Medicine

## 2009-05-28 ENCOUNTER — Telehealth: Payer: Self-pay | Admitting: *Deleted

## 2009-05-29 ENCOUNTER — Encounter: Payer: Self-pay | Admitting: Family Medicine

## 2009-05-29 ENCOUNTER — Ambulatory Visit: Payer: Self-pay | Admitting: Oncology

## 2009-06-01 LAB — CBC WITH DIFFERENTIAL/PLATELET
Basophils Absolute: 0.1 10*3/uL (ref 0.0–0.1)
EOS%: 0.8 % (ref 0.0–7.0)
Eosinophils Absolute: 0.3 10*3/uL (ref 0.0–0.5)
HCT: 28.2 % — ABNORMAL LOW (ref 34.8–46.6)
HGB: 9.1 g/dL — ABNORMAL LOW (ref 11.6–15.9)
MCH: 29.2 pg (ref 25.1–34.0)
MONO#: 2.2 10*3/uL — ABNORMAL HIGH (ref 0.1–0.9)
NEUT#: 3 10*3/uL (ref 1.5–6.5)
NEUT%: 7 % — ABNORMAL LOW (ref 38.4–76.8)
RDW: 18.3 % — ABNORMAL HIGH (ref 11.2–14.5)
WBC: 42.6 10*3/uL — ABNORMAL HIGH (ref 3.9–10.3)
lymph#: 37 10*3/uL — ABNORMAL HIGH (ref 0.9–3.3)

## 2009-06-01 LAB — COMPREHENSIVE METABOLIC PANEL
Albumin: 4.2 g/dL (ref 3.5–5.2)
Alkaline Phosphatase: 81 U/L (ref 39–117)
BUN: 53 mg/dL — ABNORMAL HIGH (ref 6–23)
Calcium: 9.1 mg/dL (ref 8.4–10.5)
Chloride: 107 mEq/L (ref 96–112)
Glucose, Bld: 96 mg/dL (ref 70–99)
Potassium: 5.2 mEq/L (ref 3.5–5.3)
Sodium: 141 mEq/L (ref 135–145)
Total Protein: 6.2 g/dL (ref 6.0–8.3)

## 2009-06-11 LAB — CBC WITH DIFFERENTIAL/PLATELET
EOS%: 0.8 % (ref 0.0–7.0)
Eosinophils Absolute: 0.3 10*3/uL (ref 0.0–0.5)
MCH: 29.4 pg (ref 25.1–34.0)
MCV: 90.9 fL (ref 79.5–101.0)
MONO%: 4.3 % (ref 0.0–14.0)
NEUT#: 2.9 10*3/uL (ref 1.5–6.5)
RBC: 3.12 10*6/uL — ABNORMAL LOW (ref 3.70–5.45)
RDW: 17.4 % — ABNORMAL HIGH (ref 11.2–14.5)
lymph#: 35.9 10*3/uL — ABNORMAL HIGH (ref 0.9–3.3)

## 2009-06-11 LAB — COMPREHENSIVE METABOLIC PANEL
AST: 23 U/L (ref 0–37)
Albumin: 4.2 g/dL (ref 3.5–5.2)
Alkaline Phosphatase: 72 U/L (ref 39–117)
Chloride: 105 mEq/L (ref 96–112)
Potassium: 4.9 mEq/L (ref 3.5–5.3)
Sodium: 139 mEq/L (ref 135–145)
Total Protein: 6.2 g/dL (ref 6.0–8.3)

## 2009-06-12 ENCOUNTER — Encounter: Payer: Self-pay | Admitting: Family Medicine

## 2009-06-25 LAB — CBC WITH DIFFERENTIAL/PLATELET
Eosinophils Absolute: 0.4 10*3/uL (ref 0.0–0.5)
LYMPH%: 88.9 % — ABNORMAL HIGH (ref 14.0–49.7)
MONO#: 1.5 10*3/uL — ABNORMAL HIGH (ref 0.1–0.9)
NEUT#: 2.4 10*3/uL (ref 1.5–6.5)
Platelets: 239 10*3/uL (ref 145–400)
RBC: 2.98 10*6/uL — ABNORMAL LOW (ref 3.70–5.45)
RDW: 18.1 % — ABNORMAL HIGH (ref 11.2–14.5)
WBC: 39.7 10*3/uL — ABNORMAL HIGH (ref 3.9–10.3)
lymph#: 35.3 10*3/uL — ABNORMAL HIGH (ref 0.9–3.3)

## 2009-06-25 LAB — COMPREHENSIVE METABOLIC PANEL
Albumin: 4 g/dL (ref 3.5–5.2)
CO2: 21 mEq/L (ref 19–32)
Calcium: 9 mg/dL (ref 8.4–10.5)
Chloride: 109 mEq/L (ref 96–112)
Glucose, Bld: 187 mg/dL — ABNORMAL HIGH (ref 70–99)
Potassium: 4.6 mEq/L (ref 3.5–5.3)
Sodium: 143 mEq/L (ref 135–145)
Total Protein: 5.9 g/dL — ABNORMAL LOW (ref 6.0–8.3)

## 2009-07-07 ENCOUNTER — Ambulatory Visit: Payer: Self-pay | Admitting: Oncology

## 2009-07-08 ENCOUNTER — Ambulatory Visit: Payer: Self-pay | Admitting: Family Medicine

## 2009-07-08 LAB — CONVERTED CEMR LAB: Hgb A1c MFr Bld: 7 %

## 2009-07-09 LAB — COMPREHENSIVE METABOLIC PANEL
ALT: 20 U/L (ref 0–35)
Albumin: 4.3 g/dL (ref 3.5–5.2)
Alkaline Phosphatase: 81 U/L (ref 39–117)
CO2: 21 mEq/L (ref 19–32)
Glucose, Bld: 126 mg/dL — ABNORMAL HIGH (ref 70–99)
Potassium: 5.1 mEq/L (ref 3.5–5.3)
Sodium: 141 mEq/L (ref 135–145)
Total Protein: 6.3 g/dL (ref 6.0–8.3)

## 2009-07-09 LAB — CBC WITH DIFFERENTIAL/PLATELET
BASO%: 0 % (ref 0.0–2.0)
Eosinophils Absolute: 0.3 10*3/uL (ref 0.0–0.5)
MCHC: 32.8 g/dL (ref 31.5–36.0)
MONO#: 1.2 10*3/uL — ABNORMAL HIGH (ref 0.1–0.9)
NEUT#: 4.3 10*3/uL (ref 1.5–6.5)
RBC: 3.18 10*6/uL — ABNORMAL LOW (ref 3.70–5.45)
RDW: 17.3 % — ABNORMAL HIGH (ref 11.2–14.5)
WBC: 38 10*3/uL — ABNORMAL HIGH (ref 3.9–10.3)

## 2009-07-09 LAB — LACTATE DEHYDROGENASE: LDH: 206 U/L (ref 94–250)

## 2009-07-10 ENCOUNTER — Encounter: Payer: Self-pay | Admitting: Family Medicine

## 2009-07-20 ENCOUNTER — Telehealth: Payer: Self-pay | Admitting: Family Medicine

## 2009-07-23 LAB — CBC WITH DIFFERENTIAL/PLATELET
BASO%: 0.3 % (ref 0.0–2.0)
EOS%: 1.1 % (ref 0.0–7.0)
HCT: 28.5 % — ABNORMAL LOW (ref 34.8–46.6)
LYMPH%: 84.2 % — ABNORMAL HIGH (ref 14.0–49.7)
MCH: 30.4 pg (ref 25.1–34.0)
MCHC: 33.4 g/dL (ref 31.5–36.0)
MONO#: 1.3 10*3/uL — ABNORMAL HIGH (ref 0.1–0.9)
NEUT%: 10 % — ABNORMAL LOW (ref 38.4–76.8)
Platelets: 237 10*3/uL (ref 145–400)
RBC: 3.13 10*6/uL — ABNORMAL LOW (ref 3.70–5.45)
WBC: 29.4 10*3/uL — ABNORMAL HIGH (ref 3.9–10.3)

## 2009-07-23 LAB — BASIC METABOLIC PANEL
CO2: 21 mEq/L (ref 19–32)
Calcium: 9.4 mg/dL (ref 8.4–10.5)
Creatinine, Ser: 1.98 mg/dL — ABNORMAL HIGH (ref 0.40–1.20)
Sodium: 143 mEq/L (ref 135–145)

## 2009-08-06 ENCOUNTER — Ambulatory Visit: Payer: Self-pay | Admitting: Oncology

## 2009-08-06 LAB — CBC WITH DIFFERENTIAL/PLATELET
Basophils Absolute: 0.1 10*3/uL (ref 0.0–0.1)
Eosinophils Absolute: 0.3 10*3/uL (ref 0.0–0.5)
HGB: 9.5 g/dL — ABNORMAL LOW (ref 11.6–15.9)
LYMPH%: 84.2 % — ABNORMAL HIGH (ref 14.0–49.7)
MCV: 92 fL (ref 79.5–101.0)
MONO%: 4.3 % (ref 0.0–14.0)
NEUT#: 2.8 10*3/uL (ref 1.5–6.5)
Platelets: 232 10*3/uL (ref 145–400)

## 2009-08-06 LAB — COMPREHENSIVE METABOLIC PANEL
Albumin: 4.3 g/dL (ref 3.5–5.2)
Alkaline Phosphatase: 64 U/L (ref 39–117)
BUN: 62 mg/dL — ABNORMAL HIGH (ref 6–23)
Glucose, Bld: 111 mg/dL — ABNORMAL HIGH (ref 70–99)
Potassium: 5.3 mEq/L (ref 3.5–5.3)
Total Bilirubin: 0.3 mg/dL (ref 0.3–1.2)

## 2009-08-10 ENCOUNTER — Encounter: Payer: Self-pay | Admitting: Family Medicine

## 2009-08-11 ENCOUNTER — Telehealth: Payer: Self-pay | Admitting: Family Medicine

## 2009-08-31 LAB — CBC WITH DIFFERENTIAL/PLATELET
Basophils Absolute: 0.1 10*3/uL (ref 0.0–0.1)
EOS%: 1 % (ref 0.0–7.0)
HCT: 30 % — ABNORMAL LOW (ref 34.8–46.6)
HGB: 10.1 g/dL — ABNORMAL LOW (ref 11.6–15.9)
MCH: 31.1 pg (ref 25.1–34.0)
MCV: 92.8 fL (ref 79.5–101.0)
MONO%: 5.1 % (ref 0.0–14.0)
NEUT%: 11.8 % — ABNORMAL LOW (ref 38.4–76.8)
RDW: 16.7 % — ABNORMAL HIGH (ref 11.2–14.5)

## 2009-08-31 LAB — COMPREHENSIVE METABOLIC PANEL
AST: 23 U/L (ref 0–37)
Alkaline Phosphatase: 65 U/L (ref 39–117)
BUN: 54 mg/dL — ABNORMAL HIGH (ref 6–23)
Creatinine, Ser: 1.94 mg/dL — ABNORMAL HIGH (ref 0.40–1.20)

## 2009-09-29 ENCOUNTER — Ambulatory Visit: Payer: Self-pay | Admitting: Oncology

## 2009-09-29 LAB — CBC WITH DIFFERENTIAL/PLATELET
Basophils Absolute: 0.1 10*3/uL (ref 0.0–0.1)
Eosinophils Absolute: 0.3 10*3/uL (ref 0.0–0.5)
HCT: 30.2 % — ABNORMAL LOW (ref 34.8–46.6)
HGB: 9.9 g/dL — ABNORMAL LOW (ref 11.6–15.9)
LYMPH%: 73.6 % — ABNORMAL HIGH (ref 14.0–49.7)
MCV: 93.2 fL (ref 79.5–101.0)
MONO#: 1 10*3/uL — ABNORMAL HIGH (ref 0.1–0.9)
MONO%: 5 % (ref 0.0–14.0)
NEUT#: 3.9 10*3/uL (ref 1.5–6.5)
Platelets: 247 10*3/uL (ref 145–400)

## 2009-09-29 LAB — COMPREHENSIVE METABOLIC PANEL
ALT: 13 U/L (ref 0–35)
Alkaline Phosphatase: 73 U/L (ref 39–117)
BUN: 52 mg/dL — ABNORMAL HIGH (ref 6–23)
CO2: 28 mEq/L (ref 19–32)
Calcium: 9.3 mg/dL (ref 8.4–10.5)
Glucose, Bld: 91 mg/dL (ref 70–99)
Total Bilirubin: 0.3 mg/dL (ref 0.3–1.2)
Total Protein: 6.4 g/dL (ref 6.0–8.3)

## 2009-10-02 ENCOUNTER — Encounter: Payer: Self-pay | Admitting: Family Medicine

## 2009-10-21 ENCOUNTER — Ambulatory Visit: Payer: Self-pay | Admitting: Family Medicine

## 2009-10-26 LAB — COMPREHENSIVE METABOLIC PANEL
ALT: 18 U/L (ref 0–35)
AST: 25 U/L (ref 0–37)
Albumin: 4.2 g/dL (ref 3.5–5.2)
Alkaline Phosphatase: 67 U/L (ref 39–117)
Potassium: 5.4 mEq/L — ABNORMAL HIGH (ref 3.5–5.3)
Sodium: 140 mEq/L (ref 135–145)
Total Protein: 6.1 g/dL (ref 6.0–8.3)

## 2009-10-26 LAB — CBC WITH DIFFERENTIAL/PLATELET
EOS%: 2 % (ref 0.0–7.0)
MCH: 31 pg (ref 25.1–34.0)
MCV: 93.9 fL (ref 79.5–101.0)
MONO%: 5.2 % (ref 0.0–14.0)
NEUT#: 3.1 10*3/uL (ref 1.5–6.5)
RBC: 3.05 10*6/uL — ABNORMAL LOW (ref 3.70–5.45)
RDW: 16.2 % — ABNORMAL HIGH (ref 11.2–14.5)

## 2009-11-19 ENCOUNTER — Ambulatory Visit: Payer: Self-pay | Admitting: Oncology

## 2009-11-23 ENCOUNTER — Encounter: Payer: Self-pay | Admitting: Family Medicine

## 2009-11-23 LAB — CBC WITH DIFFERENTIAL/PLATELET
Eosinophils Absolute: 0.2 10*3/uL (ref 0.0–0.5)
LYMPH%: 73.8 % — ABNORMAL HIGH (ref 14.0–49.7)
MCHC: 31.8 g/dL (ref 31.5–36.0)
MCV: 95 fL (ref 79.5–101.0)
MONO%: 6.5 % (ref 0.0–14.0)
NEUT#: 2.7 10*3/uL (ref 1.5–6.5)
Platelets: 204 10*3/uL (ref 145–400)
RBC: 3.18 10*6/uL — ABNORMAL LOW (ref 3.70–5.45)

## 2009-11-23 LAB — CONVERTED CEMR LAB
AST: 26 units/L
Albumin: 4 g/dL
Alkaline Phosphatase: 78 units/L
Calcium: 9.1 mg/dL
Chloride: 110 meq/L
Glucose, Bld: 122 mg/dL
Potassium: 6 meq/L
Sodium: 139 meq/L
Total Protein: 5.9 g/dL

## 2009-11-23 LAB — COMPREHENSIVE METABOLIC PANEL
ALT: 18 U/L (ref 0–35)
CO2: 21 mEq/L (ref 19–32)
Creatinine, Ser: 1.74 mg/dL — ABNORMAL HIGH (ref 0.40–1.20)
Total Bilirubin: 0.3 mg/dL (ref 0.3–1.2)

## 2009-11-23 LAB — MAGNESIUM: Magnesium: 1.6 mg/dL (ref 1.5–2.5)

## 2009-11-23 LAB — TECHNOLOGIST REVIEW

## 2009-11-25 ENCOUNTER — Encounter: Payer: Self-pay | Admitting: Family Medicine

## 2009-11-30 ENCOUNTER — Telehealth: Payer: Self-pay | Admitting: Family Medicine

## 2009-11-30 LAB — BASIC METABOLIC PANEL
CO2: 27 mEq/L (ref 19–32)
Calcium: 9 mg/dL (ref 8.4–10.5)
Creatinine, Ser: 1.66 mg/dL — ABNORMAL HIGH (ref 0.40–1.20)

## 2009-12-04 LAB — BASIC METABOLIC PANEL
Chloride: 111 mEq/L (ref 96–112)
Creatinine, Ser: 1.8 mg/dL — ABNORMAL HIGH (ref 0.40–1.20)
Potassium: 5.4 mEq/L — ABNORMAL HIGH (ref 3.5–5.3)
Sodium: 144 mEq/L (ref 135–145)

## 2009-12-16 ENCOUNTER — Ambulatory Visit: Payer: Self-pay | Admitting: Oncology

## 2009-12-21 LAB — CBC WITH DIFFERENTIAL/PLATELET
Eosinophils Absolute: 0.2 10*3/uL (ref 0.0–0.5)
HCT: 28.1 % — ABNORMAL LOW (ref 34.8–46.6)
LYMPH%: 70.1 % — ABNORMAL HIGH (ref 14.0–49.7)
MCV: 94.6 fL (ref 79.5–101.0)
MONO#: 0.8 10*3/uL (ref 0.1–0.9)
MONO%: 6.4 % (ref 0.0–14.0)
NEUT#: 2.5 10*3/uL (ref 1.5–6.5)
NEUT%: 21.5 % — ABNORMAL LOW (ref 38.4–76.8)
Platelets: 225 10*3/uL (ref 145–400)
RBC: 2.97 10*6/uL — ABNORMAL LOW (ref 3.70–5.45)
WBC: 11.7 10*3/uL — ABNORMAL HIGH (ref 3.9–10.3)

## 2009-12-21 LAB — COMPREHENSIVE METABOLIC PANEL
Alkaline Phosphatase: 72 U/L (ref 39–117)
BUN: 48 mg/dL — ABNORMAL HIGH (ref 6–23)
CO2: 24 mEq/L (ref 19–32)
Creatinine, Ser: 2.03 mg/dL — ABNORMAL HIGH (ref 0.40–1.20)
Glucose, Bld: 92 mg/dL (ref 70–99)
Total Bilirubin: 0.3 mg/dL (ref 0.3–1.2)

## 2009-12-21 LAB — MAGNESIUM: Magnesium: 1.7 mg/dL (ref 1.5–2.5)

## 2009-12-23 ENCOUNTER — Telehealth: Payer: Self-pay | Admitting: Family Medicine

## 2009-12-30 ENCOUNTER — Ambulatory Visit: Payer: Self-pay | Admitting: Family Medicine

## 2009-12-30 ENCOUNTER — Telehealth: Payer: Self-pay | Admitting: Family Medicine

## 2009-12-30 ENCOUNTER — Encounter: Payer: Self-pay | Admitting: Family Medicine

## 2009-12-30 LAB — CONVERTED CEMR LAB
Alkaline Phosphatase: 72 units/L (ref 39–117)
CO2: 22 meq/L (ref 19–32)
Creatinine, Ser: 1.62 mg/dL — ABNORMAL HIGH (ref 0.40–1.20)
Glucose, Bld: 72 mg/dL (ref 70–99)
Total Bilirubin: 0.3 mg/dL (ref 0.3–1.2)

## 2009-12-31 ENCOUNTER — Encounter
Admission: RE | Admit: 2009-12-31 | Discharge: 2009-12-31 | Payer: Self-pay | Source: Home / Self Care | Admitting: Family Medicine

## 2010-01-15 ENCOUNTER — Ambulatory Visit: Payer: Self-pay | Admitting: Oncology

## 2010-01-19 ENCOUNTER — Encounter: Payer: Self-pay | Admitting: Family Medicine

## 2010-01-20 LAB — COMPREHENSIVE METABOLIC PANEL
ALT: 15 U/L (ref 0–35)
Albumin: 4.2 g/dL (ref 3.5–5.2)
CO2: 21 mEq/L (ref 19–32)
Calcium: 9.1 mg/dL (ref 8.4–10.5)
Chloride: 108 mEq/L (ref 96–112)
Glucose, Bld: 140 mg/dL — ABNORMAL HIGH (ref 70–99)
Potassium: 5.2 mEq/L (ref 3.5–5.3)
Sodium: 140 mEq/L (ref 135–145)
Total Protein: 6.3 g/dL (ref 6.0–8.3)

## 2010-01-20 LAB — CBC WITH DIFFERENTIAL/PLATELET
BASO%: 0.2 % (ref 0.0–2.0)
Eosinophils Absolute: 0.2 10*3/uL (ref 0.0–0.5)
MCHC: 32.8 g/dL (ref 31.5–36.0)
MONO#: 0.9 10*3/uL (ref 0.1–0.9)
NEUT#: 3.1 10*3/uL (ref 1.5–6.5)
RBC: 3.38 10*6/uL — ABNORMAL LOW (ref 3.70–5.45)
WBC: 14.7 10*3/uL — ABNORMAL HIGH (ref 3.9–10.3)
lymph#: 10.4 10*3/uL — ABNORMAL HIGH (ref 0.9–3.3)

## 2010-01-20 LAB — MAGNESIUM: Magnesium: 1.6 mg/dL (ref 1.5–2.5)

## 2010-02-01 ENCOUNTER — Encounter: Payer: Self-pay | Admitting: Family Medicine

## 2010-02-02 ENCOUNTER — Telehealth: Payer: Self-pay | Admitting: Family Medicine

## 2010-02-14 ENCOUNTER — Encounter: Payer: Self-pay | Admitting: Interventional Cardiology

## 2010-02-15 ENCOUNTER — Encounter: Payer: Self-pay | Admitting: Family Medicine

## 2010-02-15 ENCOUNTER — Ambulatory Visit: Payer: Self-pay | Admitting: Oncology

## 2010-02-15 LAB — CBC WITH DIFFERENTIAL/PLATELET
Basophils Absolute: 0 10*3/uL (ref 0.0–0.1)
HCT: 33.1 % — ABNORMAL LOW (ref 34.8–46.6)
HGB: 10.7 g/dL — ABNORMAL LOW (ref 11.6–15.9)
MCH: 30.5 pg (ref 25.1–34.0)
MCHC: 32.5 g/dL (ref 31.5–36.0)
MCV: 93.9 fL (ref 79.5–101.0)
MONO#: 0.7 10*3/uL (ref 0.1–0.9)
NEUT%: 27.7 % — ABNORMAL LOW (ref 38.4–76.8)
Platelets: 247 10*3/uL (ref 145–400)
RBC: 3.52 10*6/uL — ABNORMAL LOW (ref 3.70–5.45)

## 2010-02-16 LAB — COMPREHENSIVE METABOLIC PANEL
AST: 25 U/L (ref 0–37)
Albumin: 4.2 g/dL (ref 3.5–5.2)
CO2: 19 mEq/L (ref 19–32)
Sodium: 140 mEq/L (ref 135–145)
Total Bilirubin: 0.3 mg/dL (ref 0.3–1.2)
Total Protein: 6.3 g/dL (ref 6.0–8.3)

## 2010-02-16 LAB — LACTATE DEHYDROGENASE: LDH: 186 U/L (ref 94–250)

## 2010-02-18 ENCOUNTER — Telehealth: Payer: Self-pay | Admitting: Family Medicine

## 2010-02-18 ENCOUNTER — Telehealth: Payer: Self-pay | Admitting: *Deleted

## 2010-02-19 ENCOUNTER — Ambulatory Visit: Admit: 2010-02-19 | Payer: Self-pay

## 2010-02-23 NOTE — Consult Note (Signed)
Summary: [facility] Hematology/Oncology  [facility] Hematology/Oncology   Imported By: Raymond Gurney 02/04/2009 15:47:06  _____________________________________________________________________  External Attachment:    Type:   Image     Comment:   External Document

## 2010-02-23 NOTE — Consult Note (Signed)
Summary: [facility]  [facility]   Imported By: Audie Clear 05/04/2009 15:30:22  _____________________________________________________________________  External Attachment:    Type:   Image     Comment:   External Document

## 2010-02-23 NOTE — Assessment & Plan Note (Signed)
Summary: f/u,df   Vital Signs:  Patient profile:   65 year old female Weight:      167 pounds Temp:     98.2 degrees F oral Pulse rate:   52 / minute Pulse rhythm:   regular BP sitting:   171 / 74  (left arm) Cuff size:   regular  Vitals Entered By: Audelia Hives CMA (October 21, 2009 1:55 PM)   Primary Care Provider:  Madison Hickman MD   History of Present Illness: Feels much better now that being treated for CLL DM - home BS great No new symptoms Exercising more Denies edema   Habits & Providers  Alcohol-Tobacco-Diet     Alcohol drinks/day: 0     Tobacco Status: never  Current Medications (verified): 1)  Bayer Aspirin 325 Mg Tabs (Aspirin) .... Take 1/2 Tablet By Mouth Once A Day 2)  Lipitor 80 Mg Tabs (Atorvastatin Calcium) .... Take 1 Tab By Mouth Daily 3)  Lisinopril 20 Mg Tabs (Lisinopril) .... Take 1 Tablet By Mouth Twice A Day 4)  Paroxetine Hcl 20 Mg Tabs (Paroxetine Hcl) .... Take 1 Tablet By Mouth Once A Day 5)  Felodipine 10 Mg Xr24h-Tab (Felodipine) .... Take On Pill Every Morning. 6)  Trilipix 45 Mg Cpdr (Choline Fenofibrate) .... One Daily Per [place] 7)  Protonix 40 Mg Pack (Pantoprazole Sodium) .... One By Mouth Daily 8)  Accu-Chek Comfort Curve   Strp (Glucose Blood) .... Check Bs Tid 9)  Fish Oil 1000 Mg  Caps (Omega-3 Fatty Acids) .... One Three Times A Day 10)  Hydrochlorothiazide 12.5 Mg  Tabs (Hydrochlorothiazide) .... Take 1 Tab  By Mouth Every Morning.  Take Instead of Furosemide. 11)  Plavix 75 Mg Tabs (Clopidogrel Bisulfate) .... Take 1 Tab By Mouth Daily 12)  Accu-Chek Comfort Curve  Strp (Glucose Blood) .... Test Three Times A Day 13)  Metoprolol Tartrate 25 Mg Tabs (Metoprolol Tartrate) .... One Half Tab By Mouth Two Times A Day 14)  Acetaminophen 500 Mg  Caps (Acetaminophen) .... Take One or Two As Needed Up To 6 Per Day Total. 15)  Lantus Solostar 100 Unit/ml Soln (Insulin Glargine) .... 24 Units Daily Is The Current Dose Disp Qs 1  Month Supply 16)  Bd Pen Needle Mini U/f 31g X 5 Mm Misc (Insulin Pen Needle) .... Dispense Needles To Supply Once Daily Injection.  1 Box. 17)  Tramadol Hcl 50 Mg  Tabs (Tramadol Hcl) .... One By Mouth Q6h As Needed Pain 18)  Allopurinol 300 Mg Tabs (Allopurinol) .... One Half Tab Daily 19)  Leukeran 2 Mg Tabs (Chlorambucil) .Marland Kitchen.. 11 Tabs By Mouth Once Every Two Weeks Per Heme/onc 20)  Triamcinolone Acetonide 0.5 % Oint (Triamcinolone Acetonide) .... Apply To Hands Only Two Times A Day As Needed.  Disp 60 Gm Tube 21)  Oxygen .... 2 Liters/min Per [place] 22)  Leukeran 2 Mg Tabs (Chlorambucil) .Marland Kitchen.. 11 Tabs Two Times Per Month. Per Heme Onc  Allergies (verified): 1)  Amoxicillin (Amoxicillin) 2)  Codeine Phosphate (Codeine Phosphate)  Past History:  Past medical, surgical, family and social histories (including risk factors) reviewed, and no changes noted (except as noted below).  Past Medical History: Reviewed history from 03/23/2006 and no changes required. 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 2nd Rt rotator cuff surg - 04/13/2001, BTL -, CABAG 10/96 -, Cholecystectomy -, esophageal  dilation 12/00 -, Lt rotator cuff surg 04/01 - 12/10/1999, repeat cath OK - 03/21/2000, Rt. Rotator cuff surg - 06/24/2000, stress test by Leonia Reeves - 11/25/1998  2010 stent to right leg for PVD  Family History: Reviewed history from 03/23/2006 and no changes required. MI,  Social History: Reviewed history from 03/23/2006 and no changes required. No smoke, No ETOH; worked at [employer], now disabled; riding 3 wheel bike for exercise  Physical Exam  General:  Well-developed,well-nourished,in no acute distress; alert,appropriate and cooperative throughout examination Lungs:  Normal respiratory effort, chest expands symmetrically. Lungs are  clear to auscultation, no crackles or wheezes. Heart:  Normal rate and regular rhythm. S1 and S2 normal without gallop, murmur, click, rub or other extra sounds. 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  Continue current Rx Her updated medication list for this problem includes:    Bayer Aspirin 325 Mg Tabs (Aspirin) .Marland Kitchen... Take 1/2 tablet by mouth once a day    Lisinopril 20 Mg Tabs (Lisinopril) .Marland Kitchen... Take 1 tablet by mouth twice a day    Lantus Solostar 100 Unit/ml Soln (Insulin glargine) .Marland Kitchen... 24 units daily is the current dose disp qs 1 month supply  Orders: A1C-[facility] [id number]) [facility]- Est  Level 4 [id number])  Labs Reviewed: Creat: 1.34 (12/24/2008)     Last Eye Exam: No diabetic retinopathy.    (08/04/2009) Reviewed HgBA1c results: 6.2 (10/21/2009)  7.0 (07/08/2009)  Problem # 2:  HYPERTENSION, BENIGN SYSTEMIC (ICD-401.1)  poor control.  Will try off furosemide and back on HCTZ because of it's better effect on BP.  Watch for recurrent edema Her updated medication list for this problem includes:    Lisinopril 20 Mg Tabs (Lisinopril) .Marland Kitchen... Take 1 tablet by mouth twice a day    Felodipine 10 Mg Xr24h-tab (Felodipine) .Marland Kitchen... Take on pill every morning.    Hydrochlorothiazide 12.5 Mg Tabs (Hydrochlorothiazide) .Marland Kitchen... Take 1 tab  by mouth every morning.  take instead of furosemide.    Metoprolol Tartrate 25 Mg Tabs (Metoprolol tartrate) ..... One half tab by mouth two times a day  BP today: 171/74 Prior BP: 162/68 (07/08/2009)  Labs Reviewed: K+: 4.9 (12/24/2008) Creat: : 1.34 (12/24/2008)   Chol: 107 (12/04/2008)   HDL: 34 (12/04/2008)   LDL: 42 (12/04/2008)   TG: 157 (12/04/2008)  Orders: [facility]- Est  Level 4 ([id number])  Problem # 3:  CHRONIC SYSTOLIC HEART FAILURE (123456) Assessment: Improved  Currently well controled - watch for recurrent edema off furosemide. Her  updated medication list for this problem includes:    Bayer Aspirin 325 Mg Tabs (Aspirin) .Marland Kitchen... Take 1/2 tablet by mouth once a day    Lisinopril 20 Mg Tabs (Lisinopril) .Marland Kitchen... Take 1 tablet by mouth twice a day    Hydrochlorothiazide 12.5 Mg Tabs (Hydrochlorothiazide) .Marland Kitchen... Take 1 tab  by mouth every morning.  take instead of furosemide.    Plavix 75 Mg Tabs (Clopidogrel bisulfate) .Marland Kitchen... Take 1 tab by mouth daily    Metoprolol Tartrate 25 Mg Tabs (Metoprolol tartrate) ..... One half tab by mouth two times a day  Orders: [facility]- Est  Level 4 [id number])  Complete Medication List: 1)  Bayer Aspirin 325 Mg Tabs (Aspirin) .... Take 1/2 tablet by mouth once a day 2)  Lipitor 80 Mg Tabs (Atorvastatin calcium) .... Take 1 tab by mouth daily 3)  Lisinopril 20 Mg Tabs (Lisinopril) .... Take 1 tablet by  mouth twice a day 4)  Paroxetine Hcl 20 Mg Tabs (Paroxetine hcl) .... Take 1 tablet by mouth once a day 5)  Felodipine 10 Mg Xr24h-tab (Felodipine) .... Take on pill every morning. 6)  Trilipix 45 Mg Cpdr (Choline fenofibrate) .... One daily per [place] 7)  Protonix 40 Mg Pack (Pantoprazole sodium) .... One by mouth daily 8)  Accu-chek Comfort Curve Strp (Glucose blood) .... Check bs tid 9)  Fish Oil 1000 Mg Caps (Omega-3 fatty acids) .... One three times a day 10)  Hydrochlorothiazide 12.5 Mg Tabs (Hydrochlorothiazide) .... Take 1 tab  by mouth every morning.  take instead of furosemide. 11)  Plavix 75 Mg Tabs (Clopidogrel bisulfate) .... Take 1 tab by mouth daily 12)  Accu-chek Comfort Curve Strp (Glucose blood) .... Test three times a day 13)  Metoprolol Tartrate 25 Mg Tabs (Metoprolol tartrate) .... One half tab by mouth two times a day 14)  Acetaminophen 500 Mg Caps (Acetaminophen) .... Take one or two as needed up to 6 per day total. 15)  Lantus Solostar 100 Unit/ml Soln (Insulin glargine) .... 24 units daily is the current dose disp qs 1 month supply 16)  Bd Pen Needle Mini U/f 31g X 5 Mm Misc  (Insulin pen needle) .... Dispense needles to supply once daily injection.  1 box. 17)  Tramadol Hcl 50 Mg Tabs (Tramadol hcl) .... One by mouth q6h as needed pain 18)  Allopurinol 300 Mg Tabs (Allopurinol) .... One half tab daily 19)  Leukeran 2 Mg Tabs (Chlorambucil) .Marland Kitchen.. 11 tabs by mouth once every two weeks per heme/onc 20)  Triamcinolone Acetonide 0.5 % Oint (Triamcinolone acetonide) .... Apply to hands only two times a day as needed.  disp 60 gm tube 21)  Oxygen  .... 2 liters/min per [place] 22)  Leukeran 2 Mg Tabs (Chlorambucil) .Marland Kitchen.. 11 tabs two times per month. per heme onc  Other Orders: Influenza Vaccine MCR [id number])  Patient Instructions: 1)  Please schedule a follow-up appointment in 3 months .  2)  Watch wt and watch for leg swelling.  Call me if problems.  May need to restart furosemide. Prescriptions: HYDROCHLOROTHIAZIDE 12.5 MG  TABS (HYDROCHLOROTHIAZIDE) Take 1 tab  by mouth every morning.  Take instead of furosemide.  #90 x 3   Entered and Authorized by:   Madison Hickman MD   Signed by:   Madison Hickman MD on 10/21/2009   Method used:   Electronically to        [facility] (retail)       [phone]       [place], [place]  [address]       Ph: [phone]       Fax: [phone]   RxID:   [id number]    Prevention & Chronic Care Immunizations   Influenza vaccine: Fluvax MCR  (10/21/2009)   Influenza vaccine due: 10/30/2008    Tetanus booster: 12/21/2005: given   Tetanus booster due: 12/22/2015    Pneumococcal vaccine: Done.  (10/24/2000)   Pneumococcal vaccine due: None    H. zoster vaccine: 11/16/2006: Zostavax  Colorectal Screening   Hemoccult: Done.  (01/24/2002)   Hemoccult due: Not Indicated    Colonoscopy: Done.  (06/25/2002)   Colonoscopy due: 06/24/2012  Other Screening   Pap smear: NEGATIVE FOR INTRAEPITHELIAL LESIONS OR MALIGNANCY.  (07/08/2009)   Pap smear action/deferral: Not indicated-other   (07/10/2009)   Pap smear due: 07/08/2012  Mammogram: ASSESSMENT: Negative - BI-RADS 1^MM DIGITAL SCREENING  (12/12/2008)   Mammogram due: 12/12/2009    DXA bone density scan: Not documented   Smoking status: never  (10/21/2009)  Diabetes Mellitus   HgbA1C: 6.2  (10/21/2009)   Hemoglobin A1C due: 01/12/2007    Eye exam: No diabetic retinopathy.     (08/04/2009)   Eye exam due: 10/30/2008    Foot exam: yes  (10/22/2008)   High risk foot: Not documented   Foot care education: Not documented   Foot exam due: 07/12/2008    Urine microalbumin/creatinine ratio: Not documented   Urine microalbumin action/deferral: Not indicated    Diabetes flowsheet reviewed?: Yes   Progress toward A1C goal: At goal  Lipids   Total Cholesterol: 107  (12/04/2008)   LDL: 42  (12/04/2008)   LDL Direct: Not documented   HDL: 34  (12/04/2008)   Triglycerides: 157  (12/04/2008)    SGOT (AST): 23  (12/04/2008)   SGPT (ALT): 22  (12/04/2008)   Alkaline phosphatase: 98  (12/04/2008)   Total bilirubin: 0.2  (12/04/2008)    Lipid flowsheet reviewed?: Yes   Progress toward LDL goal: At goal  Hypertension   Last Blood Pressure: 171 / 74  (10/21/2009)   Serum creatinine: 1.34  (12/24/2008)   Serum potassium 4.9  (12/24/2008)    Hypertension flowsheet reviewed?: Yes   Progress toward BP goal: Unchanged  Self-Management Support :   Personal Goals (by the next clinic visit) :     Personal A1C goal: 7  (07/08/2009)     Personal blood pressure goal: 130/80  (10/13/2008)     Personal LDL goal: 100  (10/13/2008)    Diabetes self-management support: Written self-care plan  (10/21/2009)   Diabetes care plan printed    Hypertension self-management support: Written self-care plan  (10/21/2009)   Hypertension self-care plan printed.    Lipid self-management support: Written self-care plan  (10/21/2009)   Lipid self-care plan printed.   Laboratory Results   Blood Tests   Date/Time Received:  October 21, 2009 2:02 PM  Date/Time Reported: October 21, 2009 2:18 PM   HGBA1C: 6.2%   (Normal Range: Non-Diabetic - 3-6%   Control Diabetic - 6-8%)  Comments: ...............test performed by............Marland KitchenAudelia Hives, CMA .............entered by...........Marland KitchenBonnie A. Martinique, MLS (ASCP)cm          Immunizations Administered:  Influenza Vaccine # 1:    Vaccine Type: Fluvax MCR    Site: left deltoid    Mfr: GlaxoSmithKline    Dose: 0.5 ml    Route: IM    Given by: Audelia Hives CMA    Exp. Date: 07/21/2010    Lot #: [id number]    VIS given: 08/18/09 version given October 21, 2009.  Flu Vaccine Consent Questions:    Do you have a history of severe allergic reactions to this vaccine? no    Any prior history of allergic reactions to egg and/or gelatin? no    Do you have a sensitivity to the preservative Thimersol? no    Do you have a past history of Guillan-Barre Syndrome? no    Do you currently have an acute febrile illness? no    Have you ever had a severe reaction to latex? no    Vaccine information given and explained to patient? yes    Are you currently pregnant? no

## 2010-02-23 NOTE — Progress Notes (Signed)
Summary: Lab results  Phone Note Call from Patient Call back at Work Phone [phone]   Reason for Call: Lab or Test Results Summary of Call: when we get pts lab results back, she would like [place] to call her on her cell phone Initial call taken by: Samara Snide,  December 30, 2009 3:29 PM  Follow-up for Phone Call        Called and left message that blood work was OK. Follow-up by: Madison Hickman MD,  December 31, 2009 9:40 AM

## 2010-02-23 NOTE — Consult Note (Signed)
Summary: [facility]   Imported By: Raymond Gurney 08/18/2009 16:35:25  _____________________________________________________________________  External Attachment:    Type:   Image     Comment:   External Document  Appended Document: [facility]    Clinical Lists Changes  Observations: Added new observation of DIAB EYE EX: No diabetic retinopathy.    (08/04/2009 16:59)      Diabetic Eye Exam  Procedure date:  08/04/2009  Findings:      No diabetic retinopathy.       Prevention & Chronic Care Immunizations   Influenza vaccine: Fluvax MCR  (11/17/2008)   Influenza vaccine due: 10/30/2008    Tetanus booster: 12/21/2005: given   Tetanus booster due: 12/22/2015    Pneumococcal vaccine: Done.  (10/24/2000)   Pneumococcal vaccine due: None    H. zoster vaccine: 11/16/2006: Zostavax  Colorectal Screening   Hemoccult: Done.  (01/24/2002)   Hemoccult due: Not Indicated    Colonoscopy: Done.  (06/25/2002)   Colonoscopy due: 06/24/2012  Other Screening   Pap smear: NEGATIVE FOR INTRAEPITHELIAL LESIONS OR MALIGNANCY.  (07/08/2009)   Pap smear action/deferral: Not indicated-other  (07/10/2009)   Pap smear due: 07/08/2012    Mammogram: ASSESSMENT: Negative - BI-RADS 1^MM DIGITAL SCREENING  (12/12/2008)   Mammogram due: 12/12/2009    DXA bone density scan: Not documented   Smoking status: never  (07/08/2009)  Diabetes Mellitus   HgbA1C: 7.0  (07/08/2009)   Hemoglobin A1C due: 01/12/2007    Eye exam: No diabetic retinopathy.     (08/04/2009)   Eye exam due: 10/30/2008    Foot exam: yes  (10/22/2008)   High risk foot: Not documented   Foot care education: Not documented   Foot exam due: 07/12/2008    Urine microalbumin/creatinine ratio: Not documented   Urine microalbumin action/deferral: Not indicated  Lipids   Total Cholesterol: 107  (12/04/2008)   LDL: 42  (12/04/2008)   LDL Direct: Not documented   HDL:  34  (12/04/2008)   Triglycerides: 157  (12/04/2008)    SGOT (AST): 23  (12/04/2008)   SGPT (ALT): 22  (12/04/2008)   Alkaline phosphatase: 98  (12/04/2008)   Total bilirubin: 0.2  (12/04/2008)  Hypertension   Last Blood Pressure: 162 / 68  (07/08/2009)   Serum creatinine: 1.34  (12/24/2008)   Serum potassium 4.9  (12/24/2008)  Self-Management Support :   Personal Goals (by the next clinic visit) :     Personal A1C goal: 7  (07/08/2009)     Personal blood pressure goal: 130/80  (10/13/2008)     Personal LDL goal: 100  (10/13/2008)    Diabetes self-management support: Written self-care plan  (07/08/2009)    Hypertension self-management support: Written self-care plan  (07/08/2009)    Lipid self-management support: Written self-care plan  (07/08/2009)

## 2010-02-23 NOTE — Consult Note (Signed)
Summary: [facility]  [facility]   Imported By: Raymond Gurney 10/15/2009 11:49:15  _____________________________________________________________________  External Attachment:    Type:   Image     Comment:   External Document

## 2010-02-23 NOTE — Miscellaneous (Signed)
Summary: refill  Clinical Lists Changes states she needs a refill on a drug that she cannot remember the name of or what it does. asked her to find d/c papers as it should be listed under meds. call [facility] on [address] with this info & they will send a fax here.Elige Radon RN  February 12, 2009 11:43 AM

## 2010-02-23 NOTE — Assessment & Plan Note (Signed)
Summary: f/up,tcb   Vital Signs:  Patient profile:   65 year old female Height:      55 inches Weight:      156.1 pounds BMI:     36.41 Temp:     98.5 degrees F Pulse rate:   65 / minute BP sitting:   169 / 74  Vitals Entered By: Isabelle Course (April 17, 2009 1:32 PM)  Serial Vital Signs/Assessments:  Time      Position  BP       Pulse  Resp  Temp     By                     152/52                         Madison Hickman MD   Primary Care Provider:  Madison Hickman MD   History of Present Illness: Feels better.  Followed by heme onc and beign treated CLL.  Hgb stable in the 9.1 range.    Home BPs have been running a bit high.  Home BS have been running great.  Habits & Providers  Alcohol-Tobacco-Diet     Alcohol drinks/day: 0     Tobacco Status: never  Exercise-Depression-Behavior     Does Patient Exercise: no     Seat Belt Use: always  Current Medications (verified): 1)  Bayer Aspirin 325 Mg Tabs (Aspirin) .... Take 1/2 Tablet By Mouth Once A Day 2)  Lipitor 80 Mg Tabs (Atorvastatin Calcium) .... Take 1 Tab By Mouth Daily 3)  Lisinopril 20 Mg Tabs (Lisinopril) .... Take 1 Tablet By Mouth Twice A Day 4)  Paroxetine Hcl 20 Mg Tabs (Paroxetine Hcl) .... Take 1 Tablet By Mouth Once A Day 5)  Felodipine 10 Mg Xr24h-Tab (Felodipine) .... Take On Pill Every Morning. 6)  Trilipix 45 Mg Cpdr (Choline Fenofibrate) .... One Daily Per [place] 7)  Protonix 40 Mg Pack (Pantoprazole Sodium) .... One By Mouth Daily 8)  Accu-Chek Comfort Curve   Strp (Glucose Blood) .... Check Bs Tid 9)  Fish Oil 1000 Mg  Caps (Omega-3 Fatty Acids) .... One Three Times A Day 10)  Furosemide 40 Mg Tabs (Furosemide) .... One By Mouth Daily 11)  Plavix 75 Mg Tabs (Clopidogrel Bisulfate) .... Take 1 Tab By Mouth Daily 12)  Accu-Chek Comfort Curve  Strp (Glucose Blood) .... Test Three Times A Day 13)  Metoprolol Tartrate 25 Mg Tabs (Metoprolol Tartrate) .... One By Mouth Bid 14)  Acetaminophen 500 Mg   Caps (Acetaminophen) .... Take One or Two As Needed Up To 6 Per Day Total. 15)  Lantus Solostar 100 Unit/ml Soln (Insulin Glargine) .... 24 Units Daily Is The Current Dose Disp Qs 1 Month Supply 16)  Bd Pen Needle Mini U/f 31g X 5 Mm Misc (Insulin Pen Needle) .... Dispense Needles To Supply Once Daily Injection.  1 Box. 17)  Tramadol Hcl 50 Mg  Tabs (Tramadol Hcl) .... One By Mouth Q6h As Needed Pain 18)  Allopurinol 300 Mg Tabs (Allopurinol) .... One Half Tab Daily 19)  Leukeran 2 Mg Tabs (Chlorambucil) .... 7 Tabs By Mouth Once A Month Per Heme/onc 20)  Triamcinolone Acetonide 0.5 % Oint (Triamcinolone Acetonide) .... Apply To Hands Only Two Times A Day As Needed.  Disp 60 Gm Tube 21)  Oxygen .... 2 Liters/min Per [place] 22)  Leukeran 2 Mg Tabs (Chlorambucil) .Marland Kitchen.. 14 Tabs One Time Per Month. Per Heme Onc  Allergies (verified): 1)  Amoxicillin (Amoxicillin) 2)  Codeine Phosphate (Codeine Phosphate)  Past History:  Past medical, surgical, family and social histories (including risk factors) reviewed, and no changes noted (except as noted below).  Past Medical History: Reviewed history from 03/23/2006 and no changes required. 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 2nd Rt rotator cuff surg - 04/13/2001, BTL -, CABAG 10/96 -, Cholecystectomy -, esophageal dilation 12/00 -, Lt rotator cuff surg 04/01 - 12/10/1999, repeat cath OK - 03/21/2000, Rt. Rotator cuff surg - 06/24/2000, stress test by Leonia Reeves - 11/25/1998  2010 stent to right leg for PVD  Family History: Reviewed history from 03/23/2006 and no changes required. MI,  Social History: Reviewed history from 03/23/2006 and no changes required. No smoke, No ETOH; worked at [employer], now disabled; riding 3 wheel bike for [employer] Use:  always Does Patient Exercise:   no  Review of Systems  The patient denies anorexia, chest pain, syncope, dyspnea on exertion, peripheral edema, abdominal pain, unusual weight change, and abnormal bleeding.    Physical Exam  General:  Well-developed,well-nourished,in no acute distress; alert,appropriate and cooperative throughout examination  Looks slightly pale. Neck:  No deformities, masses, or tenderness noted. Lungs:  Normal respiratory effort, chest expands symmetrically. Lungs are clear to auscultation, no crackles or wheezes. Heart:  2/6 SEM Abdomen:  Bowel sounds positive,abdomen soft and non-tender without masses, organomegaly or hernias noted.   Impression & Recommendations:  Problem # 1:  HYPERTENSION, BENIGN SYSTEMIC (ICD-401.1) Assessment Unchanged  Increase lisinopril Her updated medication list for this problem includes:    Lisinopril 20 Mg Tabs (Lisinopril) .Marland Kitchen... Take 1 tablet by mouth twice a day    Felodipine 10 Mg Xr24h-tab (Felodipine) .Marland Kitchen... Take on pill every morning.    Furosemide 40 Mg Tabs (Furosemide) ..... One by mouth daily    Metoprolol Tartrate 25 Mg Tabs (Metoprolol tartrate) ..... 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 Take 1/2 tablet by mouth once a day    Lisinopril 20 Mg Tabs (Lisinopril) .Marland Kitchen... Take 1 tablet by mouth twice a day    Lantus Solostar 100 Unit/ml Soln (Insulin glargine) .Marland Kitchen... 24 units daily is the current dose disp qs 1 month supply  Orders: [facility] [id number]) [place]- Est  Level 4 [id number])  Labs Reviewed: Creat: 1.34 (12/24/2008)     Last Eye Exam: normal  (10/31/2007) Reviewed HgBA1c results: 7.1 (04/17/2009)  9.7 (12/24/2008)  Problem # 3:  ANEMIA, OTHER, UNSPECIFIED (ICD-285.9)  Reviewed outside labs with stable Hgb=9.1 Hgb: 8.6 (12/24/2008)   Hct: 29.3 (12/24/2008)   Platelets: 466 (12/24/2008) RBC: 3.43 (12/24/2008)   RDW: 17.2 (12/24/2008)   WBC: 100.5 (12/24/2008) MCV: 85.4 (12/24/2008)   MCHC: 29.4 (12/24/2008) Ferritin: 16 (06/19/2008) TSH: 2.008 (11/11/2008)  Orders: [place]- Est  Level 4 (99214)  Problem # 4:  CHRONIC SYSTOLIC HEART FAILURE (123456) Assessment: Improved  Seems well controled Her updated medication list for this problem includes:  Bayer Aspirin 325 Mg Tabs (Aspirin) .Marland Kitchen... Take 1/2 tablet by mouth once a day    Lisinopril 20 Mg Tabs (Lisinopril) .Marland Kitchen... Take 1 tablet by mouth twice a day    Furosemide 40 Mg Tabs (Furosemide) ..... One by mouth daily    Plavix 75 Mg Tabs (Clopidogrel bisulfate) .Marland Kitchen... Take 1 tab by mouth daily    Metoprolol Tartrate 25 Mg Tabs (Metoprolol tartrate) ..... One by mouth bid  Orders: [facility]- Est  Level 4 [id number])  Complete Medication List: 1)  Bayer Aspirin 325 Mg Tabs (Aspirin) .... Take 1/2 tablet by mouth once a day 2)  Lipitor 80 Mg Tabs (Atorvastatin calcium) .... Take 1 tab by mouth daily 3)  Lisinopril 20 Mg Tabs (Lisinopril) .... Take 1 tablet by mouth twice a day 4)  Paroxetine Hcl 20 Mg Tabs (Paroxetine hcl) .... Take 1 tablet by mouth once a day 5)  Felodipine 10 Mg Xr24h-tab (Felodipine) .... Take on pill every morning. 6)  Trilipix 45 Mg Cpdr (Choline fenofibrate) .... One daily per [place] 7)  Protonix 40 Mg Pack (Pantoprazole sodium) .... One by mouth daily 8)  Accu-chek Comfort Curve Strp (Glucose blood) .... Check bs tid 9)  Fish Oil 1000 Mg Caps (Omega-3 fatty acids) .... One three times a day 10)  Furosemide 40 Mg Tabs (Furosemide) .... One by mouth daily 11)  Plavix 75 Mg Tabs (Clopidogrel bisulfate) .... Take 1 tab by mouth daily 12)  Accu-chek Comfort  Curve Strp (Glucose blood) .... Test three times a day 13)  Metoprolol Tartrate 25 Mg Tabs (Metoprolol tartrate) .... One by mouth bid 14)  Acetaminophen 500 Mg Caps (Acetaminophen) .... Take one or two as needed up to 6 per day total. 15)  Lantus Solostar 100 Unit/ml Soln (Insulin glargine) .... 24 units daily is the current dose disp qs 1 month supply 16)  Bd Pen Needle Mini U/f 31g X 5 Mm Misc (Insulin pen needle) .... Dispense needles to supply once daily injection.  1 box. 17)  Tramadol Hcl 50 Mg Tabs (Tramadol hcl) .... One by mouth q6h as needed pain 18)  Allopurinol 300 Mg Tabs (Allopurinol) .... One half tab daily 19)  Leukeran 2 Mg Tabs (Chlorambucil) .... 7 tabs by mouth once a month per heme/onc 20)  Triamcinolone Acetonide 0.5 % Oint (Triamcinolone acetonide) .... Apply to hands only two times a day as needed.  disp 60 gm tube 21)  Oxygen  .... 2 liters/min per [place] 22)  Leukeran 2 Mg Tabs (Chlorambucil) .Marland Kitchen.. 14 tabs one time per month. per heme onc  Patient Instructions: 1)  Please schedule a follow-up appointment in 3 months for your last PAP smear every 2)  Please get an eye exam and have report sent to me.  Prescriptions: LISINOPRIL 20 MG TABS (LISINOPRIL) Take 1 tablet by mouth twice a day  #62 x 12   Entered and Authorized by:   Madison Hickman MD   Signed by:   Madison Hickman MD on 04/17/2009   Method used:   Electronically to        [facility] (retail)       [phone]       [place],   [address]       Ph: [phone]       Fax: [phone]   RxID:   [id number]    Prevention & Chronic Care Immunizations   Influenza vaccine: Fluvax MCR  (  11/17/2008)   Influenza vaccine due: 10/30/2008    Tetanus booster: 12/21/2005: given   Tetanus booster due: 12/22/2015    Pneumococcal vaccine: Done.  (10/24/2000)   Pneumococcal vaccine due: None    H. zoster vaccine: 11/16/2006: Zostavax  Colorectal Screening    Hemoccult: Done.  (01/24/2002)   Hemoccult due: Not Indicated    Colonoscopy: Done.  (06/25/2002)   Colonoscopy due: 06/24/2012  Other Screening   Pap smear: Done.  (09/24/2005)   Pap smear due: 09/24/2008    Mammogram: ASSESSMENT: Negative - BI-RADS 1^MM DIGITAL SCREENING  (12/12/2008)   Mammogram due: 12/12/2009    DXA bone density scan: Not documented   Smoking status: never  (04/17/2009)  Diabetes Mellitus   HgbA1C: 7.1  (04/17/2009)   Hemoglobin A1C due: 01/12/2007    Eye exam: normal  (10/31/2007)   Eye exam due: 10/30/2008    Foot exam: yes  (10/22/2008)   High risk foot: Not documented   Foot care education: Not documented   Foot exam due: 07/12/2008    Urine microalbumin/creatinine ratio: Not documented   Urine microalbumin action/deferral: Not indicated    Diabetes flowsheet reviewed?: Yes   Progress toward A1C goal: Improved  Lipids   Total Cholesterol: 107  (12/04/2008)   LDL: 42  (12/04/2008)   LDL Direct: Not documented   HDL: 34  (12/04/2008)   Triglycerides: 157  (12/04/2008)    SGOT (AST): 23  (12/04/2008)   SGPT (ALT): 22  (12/04/2008)   Alkaline phosphatase: 98  (12/04/2008)   Total bilirubin: 0.2  (12/04/2008)    Lipid flowsheet reviewed?: Yes   Progress toward LDL goal: At goal  Hypertension   Last Blood Pressure: 169 / 74  (04/17/2009)   Serum creatinine: 1.34  (12/24/2008)   Serum potassium 4.9  (12/24/2008)    Hypertension flowsheet reviewed?: Yes   Progress toward BP goal: Unchanged  Self-Management Support :   Personal Goals (by the next clinic visit) :     Personal A1C goal: 8  (12/25/2008)     Personal blood pressure goal: 130/80  (10/13/2008)     Personal LDL goal: 100  (10/13/2008)    Diabetes self-management support: Written self-care plan  (04/17/2009)   Diabetes care plan printed    Hypertension self-management support: Written self-care plan  (04/17/2009)   Hypertension self-care plan printed.    Lipid  self-management support: Written self-care plan  (04/17/2009)   Lipid self-care plan printed.   Laboratory Results   Blood Tests   Date/Time Received: April 17, 2009 1:56 PM  Date/Time Reported: April 17, 2009 2:31 PM   HGBA1C: 7.1%   (Normal Range: Non-Diabetic - 3-6%   Control Diabetic - 6-8%)  Comments: ...........test performed by...........Marland KitchenHedy Camara, CMA

## 2010-02-23 NOTE — Progress Notes (Signed)
Summary: needs rx  Phone Note Call from Patient Call back at Work Phone [phone]   Caller: Patient Summary of Call: pt is requesting diabetic shoes - needs a rx for this Initial call taken by: Audie Clear,  July 20, 2009 11:59 AM  Follow-up for Phone Call        I will place a hand written Rx at the front desk for her to pick up. Follow-up by: Madison Hickman MD,  July 20, 2009 12:04 PM  Additional Follow-up for Phone Call Additional follow up Details #1::        Pt notified, voiced understanding, advised to check with ins company on what medical supply company they participate with Additional Follow-up by: Isabelle Course,  July 20, 2009 12:11 PM

## 2010-02-23 NOTE — Assessment & Plan Note (Signed)
Summary: s/p UTI & PNA/feeling worse again/[place]   Vital Signs:  Patient profile:   65 year old female Height:      65 inches Weight:      163.06 pounds BMI:     27.23 O2 Sat:      93 % on Room air Temp:     97.7 degrees F oral Pulse rate:   64 / minute Pulse rhythm:   regular BP sitting:   160 / 57  (right arm)  Vitals Entered By: Janeth Rase LPN (September 20, 624THL 2:07 PM)  O2 Flow:  Room air CC: c/o dizziness and fatigue.  States she gets chills. Is Patient Diabetic? Yes  Pain Assessment Patient in pain? no        Primary Care Provider:  Madison Hickman MD  CC:  c/o dizziness and fatigue.  States she gets chills..  History of Present Illness: 65 yo female with PMHx of DMII, CLL presents with c/o dizziness and fatigue, s/p 3 day hospitalization for PNA and UTI.   Prior to her hospitalization, she felt a similar dizziness and fatigue that was also associated with a productive cough, fever/chills. She was hospitalized from 10/06/08 to 10/08/08 and discharged with a 5 day Rx for Levaquin which she finished. She felt better for a few days after finishing the Abx, but started feeling weak last night.   Today, she denies cough, CP, dyspnea, HA, vision changes, abdominal pain, N/V/D/C, dysuria, frequency, hematuria, lower extremity edema, and vertigo. She endorses some runny nose, "irritated throat," subjective fever/chills, dizziness, and weakness.  Since leaving the hospital, patient has been eating much less than normal in order to avoid irritating her stomach. Yesterday, she ate a piece of fruit in the am and a cup of soup in the pm (good representation of diet for past week). She endorses 1-2 glasses of water each day. Weight = 165 in May, so stable.   DMII: A1c today unchanged from last visit. Patient does check BG at home. No hypoglycemia.   Updated contact info: [phone] (cell)  Habits & Providers  Alcohol-Tobacco-Diet     Alcohol drinks/day: 0     Tobacco  Status: quit  Current Medications (verified): 1)  Bayer Aspirin 325 Mg Tabs (Aspirin) .... Take 1/2 Tablet By Mouth Once A Day 2)  Lipitor 80 Mg Tabs (Atorvastatin Calcium) .... Take 1 Tab By Mouth Daily 3)  Darvocet-N 100 100-650 Mg Tabs (Propoxyphene N-Apap) .... One Every 6 Hours As Needed For Pain 4)  Glucophage 1000 Mg Tabs (Metformin Hcl) .... Take 1 Tablet By Mouth Twice A Day 5)  Lisinopril 20 Mg Tabs (Lisinopril) .... Take 1 Tablet By Mouth Once A Day 6)  Metoprolol Tartrate 25 Mg Tabs (Metoprolol Tartrate) .... Take 1 Tablet By Mouth Twice A Day 7)  Paroxetine Hcl 20 Mg Tabs (Paroxetine Hcl) .... Take 1 Tablet By Mouth Once A Day 8)  Plendil 5 Mg Tb24 (Felodipine) .... Take 1 Tablet By Mouth Once A Day 9)  Trilipix 45 Mg Cpdr (Choline Fenofibrate) .... One Daily Per Edmunds 10)  Zetia 10 Mg Tabs (Ezetimibe) .... Take 1 Tablet By Mouth Once A Day 11)  Ranitidine Hcl 150 Mg Caps (Ranitidine Hcl) .... One Tab Daily 12)  Accu-Chek Comfort Curve   Strp (Glucose Blood) .... Check Bs Tid 13)  Fish Oil 1000 Mg  Caps (Omega-3 Fatty Acids) .... One Three Times A Day 14)  Furosemide 20 Mg Tabs (Furosemide) .... Take 1 Tab By Mouth Every  Day 15)  Plavix 75 Mg Tabs (Clopidogrel Bisulfate) .... Take 1 Tab By Mouth Daily 16)  Glipizide Xl 10 Mg Tb24 (Glipizide) .Marland Kitchen.. 1 Tablet By Mouth Daily  Allergies (verified): 1)  Amoxicillin (Amoxicillin) 2)  Codeine Phosphate (Codeine Phosphate) PMH-FH-SH reviewed for relevance  Social History: Smoking Status:  quit  Review of Systems       per HPI  Physical Exam  General:  Well-developed, well-nourished, in no acute distress; alert, appropriate and cooperative throughout examination. Vitals reviewed. Head:  normocephalic and atraumatic.   Eyes:  pupils equal, pupils round, and pupils reactive to light.  pale conjunctiva. Ears:  R ear normal and L ear normal.   Nose:  clear nasal discharge Mouth:  pharynx pink and moist, no erythema, no  exudates.   Neck:  supple, full ROM, and no masses.   Lungs:  Normal respiratory effort, chest expands symmetrically. Lungs are clear to auscultation, no crackles or wheezes. O2 saturation >92% Heart:  RRR +3/6 SEM Abdomen:  soft, non-tender, and normal bowel sounds.   Pulses:  2+ dp Extremities:  no edema Neurologic:  alert & oriented X3, cranial nerves II-XII intact, and sensation intact to light touch.   Skin:  Intact without suspicious lesions or rashes. Psych:  Oriented X3, memory intact for recent and remote, normally interactive, good eye contact, and not anxious appearing.     Impression & Recommendations:  Problem # 1:  FATIGUE (ICD-780.79) Assessment New  No red flags. Likely residual fatigue from PNA, UTI and hospitalization worsened by poor po intake. Will check CBC, BMP. Patient with CLL and previous WBCs normally in 60s, highest 80s. Patient has an appointment to see PCP, Dr. Andria Frames in 2 days. Will allow her to cancel appointment if labs unchanged and patient not feeling worse. Gave red flags for seeking medical care.  Orders: [place]- Est  Level 4 [id number])  Problem # 2:  CLL (ICD-204.10) Assessment: Unchanged  See #1.  Orders: [place]- Est  Level 4 [id number])  Problem # 3:  HYPERTENSION, BENIGN SYSTEMIC (ICD-401.1) Assessment: Unchanged  BP similar to last visit in May. Will defer to PCP for adjustment.  Her updated medication list for this problem includes:    Lisinopril 20 Mg Tabs (Lisinopril) .Marland Kitchen... Take 1 tablet by mouth once a day    Metoprolol Tartrate 25 Mg Tabs (Metoprolol tartrate) .Marland Kitchen... Take 1 tablet by mouth twice a day    Plendil 5 Mg Tb24 (Felodipine) .Marland Kitchen... Take 1 tablet by mouth once a day    Furosemide 20 Mg Tabs (Furosemide) .Marland Kitchen... Take 1 tab by mouth every day  Orders: [place]- Est  Level 4 [id number])  Problem # 4:  DIABETES MELLITUS, II, COMPLICATIONS ([id number]) Assessment: Unchanged No hypoglycemia. Her updated medication list for this problem includes:     Bayer Aspirin 325 Mg Tabs (Aspirin) .Marland Kitchen... Take 1/2 tablet by mouth once a day    Glucophage 1000 Mg Tabs (Metformin hcl) .Marland Kitchen... Take 1 tablet by mouth twice a day    Lisinopril 20 Mg Tabs (Lisinopril) .Marland Kitchen... Take 1 tablet by mouth once a day    Glipizide Xl 10 Mg Tb24 (Glipizide) .Marland Kitchen... 1 tablet by mouth daily  Orders: A1C-[facility] [id number]) [place]- Est  Level 4 [id number])  Problem # 5:  DIZZINESS (ICD-780.4) Assessment: New No red flags. Patient with no dizziness during visit. Gave precautions. No driving until feeling better. Check labs, urine. Maintaining oxygen saturation. No s/s of residual PNA or UTI.   Orders: Pulse Oximetry- [place] [phone])  CBC-[facility] [phone]) Urinalysis-[facility] (00000) Basic Met-[facility] [phone]) [facility]- Est  Level 4 [id number])  Complete Medication List: 1)  Bayer Aspirin 325 Mg Tabs (Aspirin) .... Take 1/2 tablet by mouth once a day 2)  Lipitor 80 Mg Tabs (Atorvastatin calcium) .... Take 1 tab by mouth daily 3)  Darvocet-n 100 100-650 Mg Tabs (Propoxyphene n-apap) .... One every 6 hours as needed for pain 4)  Glucophage 1000 Mg Tabs (Metformin hcl) .... Take 1 tablet by mouth twice a day 5)  Lisinopril 20 Mg Tabs (Lisinopril) .... Take 1 tablet by mouth once a day 6)  Metoprolol Tartrate 25 Mg Tabs (Metoprolol tartrate) .... Take 1 tablet by mouth twice a day 7)  Paroxetine Hcl 20 Mg Tabs (Paroxetine hcl) .... Take 1 tablet by mouth once a day 8)  Plendil 5 Mg Tb24 (Felodipine) .... Take 1 tablet by mouth once a day 9)  Trilipix 45 Mg Cpdr (Choline fenofibrate) .... One daily per edmunds 10)  Zetia 10 Mg Tabs (Ezetimibe) .... Take 1 tablet by mouth once a day 11)  Ranitidine Hcl 150 Mg Caps (Ranitidine hcl) .... One tab daily 12)  Accu-chek Comfort Curve Strp (Glucose blood) .... Check bs tid 13)  Fish Oil 1000 Mg Caps (Omega-3 fatty acids) .... One three times a day 14)  Furosemide 20 Mg Tabs (Furosemide) .... Take 1 tab by mouth every day 15)  Plavix 75 Mg Tabs (Clopidogrel bisulfate)  .... Take 1 tab by mouth daily 16)  Glipizide Xl 10 Mg Tb24 (Glipizide) .Marland Kitchen.. 1 tablet by mouth daily  Patient Instructions: 1)  It was very nice to meet you today! 2)  Make sure to eat regular meals and drink lots of water. 3)  We will check some labs today and let you know the results when they are back. 4)  Let us know if you have any questions or problems!  Laboratory Results   Urine Tests  Date/Time Received: October 13, 2008 3:33 PM  Date/Time Reported: October 13, 2008 4:11 PM   Routine Urinalysis   Color: yellow Appearance: Clear Glucose: negative   (Normal Range: Negative) Bilirubin: negative   (Normal Range: Negative) Ketone: negative   (Normal Range: Negative) Spec. Gravity: 1.025   (Normal Range: 1.003-1.035) Blood: negative   (Normal Range: Negative) pH: 5.0   (Normal Range: 5.0-8.0) Protein: >=300   (Normal Range: Negative) Urobilinogen: 0.2   (Normal Range: 0-1) Nitrite: negative   (Normal Range: Negative) Leukocyte Esterace: small   (Normal Range: Negative)  Urine Microscopic WBC/HPF: 1-5 RBC/HPF: 0-3 Bacteria/HPF: 2+ Mucous/HPF: trace Epithelial/HPF: 5-10 Casts/LPF: occ granular; 5-10 hyaline    Comments: ...........test performed by...........Marland KitchenHedy Camara, CMA   Blood Tests   Date/Time Received: October 13, 2008 2:11 PM  Date/Time Reported: October 13, 2008 2:22 PM   HGBA1C: 7.5%   (Normal Range: Non-Diabetic - 3-6%   Control Diabetic - 6-8%)  Comments: ...........test performed by...........Marland KitchenHedy Camara, CMA        Prevention & Chronic Care Immunizations   Influenza vaccine: given  (10/31/2007)   Influenza vaccine due: 10/30/2008    Tetanus booster: 12/21/2005: given   Tetanus booster due: 12/22/2015    Pneumococcal vaccine: Done.  (10/24/2000)   Pneumococcal vaccine due: None    H. zoster vaccine: 11/16/2006: Zostavax  Colorectal Screening   Hemoccult: Done.  (01/24/2002)   Hemoccult due: Not Indicated     Colonoscopy: Done.  (06/25/2002)   Colonoscopy due: 06/24/2012  Other Screening   Pap smear: Done.  (09/24/2005)  Pap smear due: 09/24/2008    Mammogram: normal  (10/24/2007)   Mammogram due: 10/23/2008    DXA bone density scan: Not documented   Smoking status: quit  (10/13/2008)  Diabetes Mellitus   HgbA1C: 7.5  (10/13/2008)   Hemoglobin A1C due: 01/12/2007    Eye exam: normal  (10/31/2007)   Eye exam due: 10/30/2008    Foot exam: yes  (07/13/2007)   High risk foot: Not documented   Foot care education: Not documented   Foot exam due: 07/12/2008    Urine microalbumin/creatinine ratio: Not documented    Diabetes flowsheet reviewed?: Yes   Progress toward A1C goal: Unchanged  Lipids   Total Cholesterol: 123  (11/26/2007)   LDL: 62  (11/26/2007)   LDL Direct: Not documented   HDL: 40  (11/26/2007)   Triglycerides: 157  (11/26/2007)    SGOT (AST): 28  (06/18/2008)   SGPT (ALT): 32  (06/18/2008)   Alkaline phosphatase: 71  (06/18/2008)   Total bilirubin: 0.2  (06/18/2008)    Lipid flowsheet reviewed?: Yes   Progress toward LDL goal: Unchanged  Hypertension   Last Blood Pressure: 160 / 57  (10/13/2008)   Serum creatinine: 1.58  (06/18/2008)   Serum potassium 5.7  (06/18/2008)    Hypertension flowsheet reviewed?: Yes   Progress toward BP goal: Unchanged  Self-Management Support :   Personal Goals (by the next clinic visit) :     Personal A1C goal: 7  (10/13/2008)     Personal blood pressure goal: 130/80  (10/13/2008)     Personal LDL goal: 100  (10/13/2008)    Patient will work on the following items until the next clinic visit to reach self-care goals:     Medications and monitoring: take my medicines every day, check my blood sugar, check my blood pressure, bring all of my medications to every visit  (10/13/2008)     Eating: eat more vegetables, eat fruit for snacks and desserts  (10/13/2008)    Diabetes self-management support: Written self-care plan   (10/13/2008)   Diabetes care plan printed    Hypertension self-management support: Written self-care plan  (10/13/2008)   Hypertension self-care plan printed.    Lipid self-management support: Written self-care plan  (10/13/2008)   Lipid self-care plan printed.  Appended Document: s/p UTI & PNA/feeling worse again/[place] Patient's labs unchanged for her, some actually slightly better. She was informed of the results.

## 2010-02-23 NOTE — Miscellaneous (Signed)
Summary: needs visit  Clinical Lists Changes left message for pt to call.  Needs office visit for [facility] orders for O2.  Contact at [facility] is Cassell Smiles [id number].Marland KitchenMarland KitchenIsabelle Course  February 06, 2009 4:51 PM left message.Elige Radon RN  February 09, 2009 9:32 AM Joe from [facility] phoned and in addition to 02 orders they also need a room air oxgyen saturation level while pt is at rest. Raymond Gurney  February 09, 2009 10:25 AM   Pt called and made f/up appt for 02/13/09. Raymond Gurney  February 09, 2009 10:44 AM   Appended Document: needs visit Pt had to call back and reschedule 1/21 appt to [phone] due to conflict with neurologist appt.

## 2010-02-23 NOTE — Progress Notes (Signed)
  Phone Note From Other Clinic Call back at [phone]   Caller: [place] Summary of Call: she has sent over paperwork to be filled out for pt to get fitted for shoes.  she wanted to know if paperwork is ready and if so would like for it to be faxed to [phone] Initial call taken by: Audelia Hives CMA,  August 11, 2009 3:02 PM  Follow-up for Phone Call        to PCP Follow-up by: Isabelle Course,  August 11, 2009 3:20 PM  Additional Follow-up for Phone Call Additional follow up Details #1::        I do not have the paperwork.  I called and asked to refax Additional Follow-up by: Madison Hickman MD,  August 11, 2009 4:49 PM

## 2010-02-23 NOTE — Progress Notes (Signed)
  Phone Note Call from Patient   Caller: Patient Call For: [phone] or [phone] Summary of Call: Ms. Rubley concerned about the elevated potassium level.  Want to know if she need to be seen here as well. Initial call taken by: Eusebio Friendly,  November 30, 2009 2:53 PM  Follow-up for Phone Call        [facility] onc has done repeat and she has taken [facility].  K+ is down to 5.4.  Told to decrease lisinopril.  She is not taking any no salt or lite salt.  Only reason I can see for K+ being high is ACE.  She will see me to recheck BP and K.  She will call for appointment in 1-2 weeks. Follow-up by: Madison Hickman MD,  November 30, 2009 4:49 PM    New/Updated Medications: LISINOPRIL 20 MG TABS (LISINOPRIL) Take 1 tablet by mouth daily

## 2010-02-23 NOTE — Miscellaneous (Signed)
Summary: Outside labs  Clinical Lists Changes Added outside labs from [facility].  They are trying to call about high potassium.  I also call and left message.   Observations: Added new observation of MAGNESIUM: 1.6 mg/dL (11/23/2009 10:15) Added new observation of CALCIUM: 9.1 mg/dL (11/23/2009 10:15) Added new observation of ALBUMIN: 4.0 g/dL (11/23/2009 10:15) Added new observation of PROTEIN, TOT: 5.9 g/dL (11/23/2009 10:15) Added new observation of SGPT (ALT): 18 units/L (11/23/2009 10:15) Added new observation of SGOT (AST): 26 units/L (11/23/2009 10:15) Added new observation of ALK PHOS: 78 units/L (11/23/2009 10:15) Added new observation of BILI TOTAL: 0.3 mg/dL (11/23/2009 10:15) Added new observation of CREATININE: 1.74 mg/dL (11/23/2009 10:15) Added new observation of BUN: 44 mg/dL (11/23/2009 10:15) Added new observation of BG RANDOM: 122 mg/dL (11/23/2009 10:15) Added new observation of CO2 PLSM/SER: 21 meq/L (11/23/2009 10:15) Added new observation of CL SERUM: 110 meq/L (11/23/2009 10:15) Added new observation of K SERUM: 6.0 meq/L (11/23/2009 10:15) Added new observation of NA: 139 meq/L (11/23/2009 10:15)

## 2010-02-23 NOTE — Consult Note (Signed)
Summary: [facility]  [facility]   Imported By: Raymond Gurney 03/03/2009 16:01:52  _____________________________________________________________________  External Attachment:    Type:   Image     Comment:   External Document

## 2010-02-23 NOTE — Assessment & Plan Note (Signed)
Summary: f/up,tcb   Vital Signs:  Patient profile:   65 year old female Height:      66 inches Weight:      157.8 pounds BMI:     25.56 Temp:     98.2 degrees F oral Pulse rate:   55 / minute BP sitting:   151 / 65  (left arm) Cuff size:   regular  Vitals Entered By: Isabelle Course (February 18, 2009 2:22 PM) CC: F/U Is Patient Diabetic? Yes Did you bring your meter with you today? No Pain Assessment Patient in pain? no        Primary Care Provider:  Madison Hickman MD  CC:  F/U.  History of Present Illness: Feeling better.  Bone marrow bx showed significant cancer load and Heme/onc is now treating.  They are doing weekly blood work  DM is great with FBS running between 60-130.  Not yet due for A1C.   Hand eczema    Current Medications (verified): 1)  Bayer Aspirin 325 Mg Tabs (Aspirin) .... Take 1/2 Tablet By Mouth Once A Day 2)  Lipitor 80 Mg Tabs (Atorvastatin Calcium) .... Take 1 Tab By Mouth Daily 3)  Lisinopril 20 Mg Tabs (Lisinopril) .... Take 1 Tablet By Mouth Once A Day 4)  Paroxetine Hcl 20 Mg Tabs (Paroxetine Hcl) .... Take 1 Tablet By Mouth Once A Day 5)  Felodipine 10 Mg Xr24h-Tab (Felodipine) .... Take On Pill Every Morning. 6)  Trilipix 45 Mg Cpdr (Choline Fenofibrate) .... One Daily Per Edmunds 7)  Protonix 40 Mg Pack (Pantoprazole Sodium) .... One By Mouth Daily 8)  Accu-Chek Comfort Curve   Strp (Glucose Blood) .... Check Bs Tid 9)  Fish Oil 1000 Mg  Caps (Omega-3 Fatty Acids) .... One Three Times A Day 10)  Furosemide 40 Mg Tabs (Furosemide) .... One By Mouth Daily 11)  Plavix 75 Mg Tabs (Clopidogrel Bisulfate) .... Take 1 Tab By Mouth Daily 12)  Promethazine Hcl 12.5 Mg Tabs (Promethazine Hcl) .... Take 1-2 Tabs Every 6 Hours As Needed For Nausea 13)  Meclizine Hcl 25 Mg Tabs (Meclizine Hcl) .... One By Mouth Q6h As Needed Dizziness 14)  Accu-Chek Comfort Curve  Strp (Glucose Blood) .... Test Three Times A Day 15)  Metoprolol Tartrate 25 Mg Tabs  (Metoprolol Tartrate) .... One By Mouth Bid 16)  Acetaminophen 500 Mg  Caps (Acetaminophen) .... Take One or Two As Needed Up To 6 Per Day Total. 17)  Lantus Solostar 100 Unit/ml Soln (Insulin Glargine) .... 20 Units Daily Is The Current Dose Disp Qs 1 Month Supply 18)  Bd Pen Needle Mini U/f 31g X 5 Mm Misc (Insulin Pen Needle) .... Dispense Needles To Supply Once Daily Injection.  1 Box. 19)  Tramadol Hcl 50 Mg  Tabs (Tramadol Hcl) .... One By Mouth Q6h As Needed Pain 20)  Allopurinol 300 Mg Tabs (Allopurinol) .... One Half Tab Daily 21)  Leukeran 2 Mg Tabs (Chlorambucil) .... 7 Tabs By Mouth Once A Month Per Heme/onc 22)  Triamcinolone Acetonide 0.5 % Oint (Triamcinolone Acetonide) .... Apply To Hands Only Two Times A Day As Needed.  Disp 60 Gm Tube  Allergies (verified): 1)  Amoxicillin (Amoxicillin) 2)  Codeine Phosphate (Codeine Phosphate)  Past History:  Past medical, surgical, family and social histories (including risk factors) reviewed, and no changes noted (except as noted below).  Past Medical History: Reviewed history from 03/23/2006 and no changes required. cervical polyp, chronic lymphocytic leukemia dxed 1996 WBC 49,000, esophageal stricture,  Lt abd wall lipoma versus hernia 11/03  Past Surgical History: Reviewed history from 06/18/2008 and no changes required. 2nd Rt rotator cuff surg - 04/13/2001, BTL -, CABAG 10/96 -, Cholecystectomy -, esophageal dilation 12/00 -, Lt rotator cuff surg 04/01 - 12/10/1999, repeat cath OK - 03/21/2000, Rt. Rotator cuff surg - 06/24/2000, stress test by Leonia Reeves - 11/25/1998  2010 stent to right leg for PVD  Family History: Reviewed history from 03/23/2006 and no changes required. MI,  Social History: Reviewed history from 03/23/2006 and no changes required. No smoke, No ETOH; worked at [employer], now disabled; riding 3 wheel bike for exercise  Physical Exam  General:  Well-developed,well-nourished,in no acute distress;  alert,appropriate and cooperative throughout examination Neck:  No deformities, masses, or tenderness noted. Lungs:  Normal respiratory effort, chest expands symmetrically. Lungs are clear to auscultation, no crackles or wheezes. Heart:  1/6 SEM Abdomen:  Bowel sounds positive,abdomen soft and non-tender without masses, organomegaly or hernias noted. Skin:  dyshyrdotic eczema both hands.   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  Increase insulin from 20 up as needed to compensate for DC glipizide. The following medications were removed from the medication list:    Glipizide Xl 10 Mg Tb24 (Glipizide) .Marland Kitchen... 1 tablet by mouth daily Her updated medication list for this problem includes:    Bayer Aspirin 325 Mg Tabs (Aspirin) .Marland Kitchen... Take 1/2 tablet by mouth once a day    Lisinopril 20 Mg Tabs (Lisinopril) .Marland Kitchen... Take 1 tablet by mouth once a day    Lantus Solostar 100 Unit/ml Soln (Insulin glargine) .Marland Kitchen... 20 units daily is the current dose disp qs 1 month supply  Orders: Glucose [facility] [id number]) [place]- Est  Level 4 (99214)  Problem # 2:  CHRONIC SYSTOLIC HEART FAILURE (123456)  and an element of rt heart failure. Continue meds and oxygen Her updated medication list for this problem includes:    Bayer Aspirin 325 Mg Tabs (Aspirin) .Marland Kitchen... Take 1/2 tablet by mouth once a day    Lisinopril 20 Mg Tabs (Lisinopril) .Marland Kitchen... Take 1 tablet by mouth once a day    Furosemide 40 Mg Tabs (Furosemide) ..... One by mouth daily    Plavix 75 Mg Tabs (Clopidogrel bisulfate) .Marland Kitchen... Take 1 tab by mouth daily    Metoprolol Tartrate 25 Mg Tabs (Metoprolol tartrate) ..... 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  Orders: [place]- Est  Level 4 [id number])  Problem # 4:  CLL (ICD-204.10)  Now being treated per heme  onc.  Orders: [place]- Est  Level 4 [id number])  Complete Medication List: 1)  Bayer Aspirin 325 Mg Tabs (Aspirin) .... Take 1/2 tablet by mouth once a day 2)  Lipitor 80 Mg Tabs (Atorvastatin calcium) .... Take 1 tab by mouth daily 3)  Lisinopril 20 Mg Tabs (Lisinopril) .... Take 1 tablet by mouth once a day 4)  Paroxetine Hcl 20 Mg Tabs (Paroxetine hcl) .... Take 1 tablet by mouth once a day 5)  Felodipine 10 Mg Xr24h-tab (Felodipine) .... Take on pill every morning. 6)  Trilipix 45 Mg Cpdr (Choline fenofibrate) .... One daily per edmunds 7)  Protonix 40 Mg Pack (Pantoprazole sodium) .... One by mouth daily 8)  Accu-chek Comfort Curve Strp (Glucose blood) .... Check bs tid 9)  Fish Oil 1000 Mg Caps (Omega-3 fatty acids) .Marland KitchenMarland KitchenMarland Kitchen  One three times a day 10)  Furosemide 40 Mg Tabs (Furosemide) .... One by mouth daily 11)  Plavix 75 Mg Tabs (Clopidogrel bisulfate) .... Take 1 tab by mouth daily 12)  Promethazine Hcl 12.5 Mg Tabs (Promethazine hcl) .... Take 1-2 tabs every 6 hours as needed for nausea 13)  Meclizine Hcl 25 Mg Tabs (Meclizine hcl) .... One by mouth q6h as needed dizziness 14)  Accu-chek Comfort Curve Strp (Glucose blood) .... Test three times a day 15)  Metoprolol Tartrate 25 Mg Tabs (Metoprolol tartrate) .... One by mouth bid 16)  Acetaminophen 500 Mg Caps (Acetaminophen) .... Take one or two as needed up to 6 per day total. 17)  Lantus Solostar 100 Unit/ml Soln (Insulin glargine) .... 20 units daily is the current dose disp qs 1 month supply 18)  Bd Pen Needle Mini U/f 31g X 5 Mm Misc (Insulin pen needle) .... Dispense needles to supply once daily injection.  1 box. 19)  Tramadol Hcl 50 Mg Tabs (Tramadol hcl) .... One by mouth q6h as needed pain 20)  Allopurinol 300 Mg Tabs (Allopurinol) .... One half tab daily 21)  Leukeran 2 Mg Tabs (Chlorambucil) .... 7 tabs by mouth once a month per heme/onc 22)  Triamcinolone Acetonide 0.5 % Oint (Triamcinolone acetonide) .... Apply to hands only  two times a day as needed.  disp 60 gm tube 23)  Oxygen  .... 2 liters/min per [place]  Patient Instructions: 1)  Please schedule a follow-up appointment in 2 months.  2)  Please have heme/onc MD send me copy of blood work. Prescriptions: TRIAMCINOLONE ACETONIDE 0.5 % OINT (TRIAMCINOLONE ACETONIDE) apply to hands only two times a day as needed.  Disp 60 gm tube  #60 x 12   Entered and Authorized by:   Madison Hickman MD   Signed by:   Madison Hickman MD on 02/18/2009   Method used:   Electronically to        [facility].* (retail)       [phone]       [place], [place]  [address]       Ph: [phone]       Fax: [phone]   RxID:   [id number] LANTUS SOLOSTAR 100 UNIT/ML SOLN (INSULIN GLARGINE) 20 units daily is the current dose Disp QS 1 month supply  #1 x 12   Entered and Authorized by:   Madison Hickman MD   Signed by:   Madison Hickman MD on 02/18/2009   Method used:   Electronically to        [facility].* (retail)       [phone]       [place], [place]  [address]       Ph: [phone]       Fax: [phone]   RxID:   [id number]

## 2010-02-23 NOTE — Consult Note (Signed)
Summary: [facility]   Imported By: Raymond Gurney 03/10/2009 15:45:12  _____________________________________________________________________  External Attachment:    Type:   Image     Comment:   External Document

## 2010-02-23 NOTE — Letter (Signed)
Summary: Generic Letter  [facility]  [address]   [place],  [address]   Phone: [phone]  Fax: [phone]    02/18/2009  ROX TALMADGE [place],   [address]  Dear Ms. Jabier Mutton, I am happy to provide this letter to you for excuse from jury duty.  I verify that you have significant medical problems including oxygen requiring CHF and leukemia under treatment.  You are too ill to serve.  Sincerely,      Madison Hickman MD

## 2010-02-23 NOTE — Consult Note (Signed)
[facility]   Imported By: Audie Clear 08/04/2009 14:48:40  _____________________________________________________________________  External Attachment:    Type:   Image     Comment:   External Document

## 2010-02-23 NOTE — Consult Note (Signed)
Summary[facility]   Imported By: Audie Clear 12/11/2009 16:23:06  _____________________________________________________________________  External Attachment:    Type:   Image     Comment:   External Document

## 2010-02-23 NOTE — Consult Note (Signed)
Summary: [facility]   Imported By: Tobin Chad 04/08/2009 10:39:23  _____________________________________________________________________  External Attachment:    Type:   Image     Comment:   External Document

## 2010-02-23 NOTE — Miscellaneous (Signed)
Summary: med update  Clinical Lists Changes Renewed fax request from pharmacy with dose change per last hospitalization. Medications: Changed medication from FUROSEMIDE 20 MG TABS (FUROSEMIDE) Hold to FUROSEMIDE 40 MG TABS (FUROSEMIDE) one by mouth daily - Signed Rx of FUROSEMIDE 40 MG TABS (FUROSEMIDE) one by mouth daily;  #30 x 12;  Signed;  Entered by: Madison Hickman MD;  Authorized by: Madison Hickman MD;  Method used: Printed then faxed to [facility] [address] [place], [place], [place]  [address], Ph: [phone], Fax: [phone]    Prescriptions: FUROSEMIDE 40 MG TABS (FUROSEMIDE) one by mouth daily  #30 x 12   Entered and Authorized by:   Madison Hickman MD   Signed by:   Madison Hickman MD on 02/17/2009   Method used:   Printed then faxed to ...       [facility].* (retail)       [phone]       [place], [place]  [address]       Ph: [phone]       Fax: [phone]   RxID:   [id number]

## 2010-02-23 NOTE — Miscellaneous (Signed)
Summary: Med refill  Clinical Lists Changes  Medications: Rx of FELODIPINE 10 MG XR24H-TAB (FELODIPINE) take on pill every morning.;  #34 x 12;  Signed;  Entered by: Madison Hickman MD;  Authorized by: Madison Hickman MD;  Method used: Electronically to [facility] [address] [place], [place], [place]  [address], Ph: [phone], Fax: [phone]    Prescriptions: FELODIPINE 10 MG XR24H-TAB (FELODIPINE) take on pill every morning.  #34 x 12   Entered and Authorized by:   Madison Hickman MD   Signed by:   Madison Hickman MD on 05/29/2009   Method used:   Electronically to        [facility].* (retail)       [phone]       [place], [place]  [address]       Ph: [phone]       Fax: [phone]   RxID:   [id number]

## 2010-02-23 NOTE — Progress Notes (Signed)
Summary: meds prob  Phone Note Call from Patient Call back at Home Phone [phone]   Caller: Patient Summary of Call: [facility] [address] refilled her Furosemide for 20mg  not 40mg  and will not correct it unless we call Initial call taken by: Audie Clear,  May 28, 2009 10:36 AM  Follow-up for Phone Call        resent & lm for ot that I have fixed problem Follow-up by: Elige Radon RN,  May 28, 2009 11:03 AM    Prescriptions: FUROSEMIDE 40 MG TABS (FUROSEMIDE) one by mouth daily  #30 x 12   Entered by:   Elige Radon RN   Authorized by:   Madison Hickman MD   Signed by:   Elige Radon RN on 05/28/2009   Method used:   Electronically to        [facility].* (retail)       [phone]       [place], [place]  [address]       Ph: [phone]       Fax: [phone]   RxID:   [id number]

## 2010-02-23 NOTE — Progress Notes (Signed)
Summary: Rx  Phone Note Refill Request   Refills Requested: Medication #1:  PROTONIX 40 MG PACK one by mouth daily  Medication #2:  ACCU-CHEK COMFORT CURVE  STRP Test three times a day  Medication #3:  BD PEN NEEDLE MINI U/F 31G X 5 MM MISC Dispense needles to supply once daily injection.  1 box. pt needs asap  Initial call taken by: Samara Snide,  December 23, 2009 12:38 PM  Follow-up for Phone Call        Done Follow-up by: Madison Hickman MD,  December 23, 2009 3:02 PM    New/Updated Medications: ACCU-CHEK COMFORT CURVE  STRP (GLUCOSE BLOOD) Test three times a day BD PEN NEEDLE MINI U/F 31G X 5 MM MISC (INSULIN PEN NEEDLE) Dispense needles to supply once daily injection.  1 box. Prescriptions: ACCU-CHEK COMFORT CURVE  STRP (GLUCOSE BLOOD) Test three times a day  #100 x 12   Entered and Authorized by:   Madison Hickman MD   Signed by:   Madison Hickman MD on 12/23/2009   Method used:   Electronically to        [facility].* (retail)       [phone]       [place], [place]  [address]       Ph: [phone]       Fax: [phone]   RxID:   [id number] BD PEN NEEDLE MINI U/F 31G X 5 MM MISC (INSULIN PEN NEEDLE) Dispense needles to supply once daily injection.  1 box.  #1 x 12   Entered and Authorized by:   Madison Hickman MD   Signed by:   Madison Hickman MD on 12/23/2009   Method used:   Electronically to        [facility].* (retail)       [phone]       [place], [place]  [address]       Ph: [phone]       Fax: [phone]   RxID:   [id number] PROTONIX 40 MG PACK (PANTOPRAZOLE SODIUM) one by mouth daily  #30 x 12   Entered and Authorized by:   Madison Hickman MD   Signed by:   Madison Hickman MD on 12/23/2009   Method used:   Electronically to        [facility].* (retail)       [phone]       [place],    [address]       Ph: [phone]       Fax: [phone]   RxID:   [id number]

## 2010-02-23 NOTE — Assessment & Plan Note (Signed)
Summary: follow up on bp meds/tlb   Vital Signs:  Patient profile:   65 year old female Weight:      171.3 pounds Temp:     98.4 degrees F Pulse rate:   60 / minute BP sitting:   177 / 74  Primary Care Provider:  Madison Hickman MD  CC:  FU high blood pressure.  History of Present Illness: [facility].  Also will have mammogram tomorrow. Has had problems with high potassium Generally feels good.  Current Medications (verified): 1)  Bayer Aspirin 325 Mg Tabs (Aspirin) .... Take 1/2 Tablet By Mouth Once A Day 2)  Lipitor 80 Mg Tabs (Atorvastatin Calcium) .... Take 1 Tab By Mouth Daily 3)  Lisinopril 20 Mg Tabs (Lisinopril) .... Take 1 Tablet By Mouth Daily 4)  Paroxetine Hcl 20 Mg Tabs (Paroxetine Hcl) .... Take 1 Tablet By Mouth Once A Day 5)  Felodipine 10 Mg Xr24h-Tab (Felodipine) .... Take On Pill Every Morning. 6)  Trilipix 45 Mg Cpdr (Choline Fenofibrate) .... One Daily Per [place] 7)  Protonix 40 Mg Pack (Pantoprazole Sodium) .... One By Mouth Daily 8)  Accu-Chek Comfort Curve   Strp (Glucose Blood) .... Check Bs Tid 9)  Fish Oil 1000 Mg  Caps (Omega-3 Fatty Acids) .... One Three Times A Day 10)  Hydrochlorothiazide 25 Mg Tabs (Hydrochlorothiazide) .... One By Mouth Daily 11)  Plavix 75 Mg Tabs (Clopidogrel Bisulfate) .... Take 1 Tab By Mouth Daily 12)  Accu-Chek Comfort Curve  Strp (Glucose Blood) .... Test Three Times A Day 13)  Metoprolol Tartrate 25 Mg Tabs (Metoprolol Tartrate) .... One Half Tab By Mouth Two Times A Day 14)  Acetaminophen 500 Mg  Caps (Acetaminophen) .... Take One or Two As Needed Up To 6 Per Day Total. 15)  Lantus Solostar 100 Unit/ml Soln (Insulin Glargine) .... 24 Units Daily Is The Current Dose Disp Qs 1 Month Supply 16)  Bd Pen Needle Mini U/f 31g X 5 Mm Misc (Insulin Pen Needle) .... Dispense Needles To Supply Once Daily Injection.  1 Box. 17)  Tramadol Hcl 50 Mg  Tabs (Tramadol Hcl) .... One By Mouth Q6h As Needed Pain 18)   Allopurinol 300 Mg Tabs (Allopurinol) .... One Half Tab Daily 19)  Leukeran 2 Mg Tabs (Chlorambucil) .Marland Kitchen.. 11 Tabs By Mouth Once Every Two Weeks Per Heme/onc 20)  Triamcinolone Acetonide 0.5 % Oint (Triamcinolone Acetonide) .... Apply To Hands Only Two Times A Day As Needed.  Disp 60 Gm Tube 21)  Oxygen .... 2 Liters/min Per [place] 22)  Leukeran 2 Mg Tabs (Chlorambucil) .Marland Kitchen.. 11 Tabs Two Times Per Month. Per Heme Onc  Allergies (verified): 1)  Amoxicillin (Amoxicillin) 2)  Codeine Phosphate (Codeine Phosphate)  Past History:  Past medical, surgical, family and social histories (including risk factors) reviewed, and no changes noted (except as noted below).  Past Medical History: Reviewed history from 03/23/2006 and no changes required. 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 2nd Rt rotator cuff surg - 04/13/2001, BTL -, CABAG 10/96 -, Cholecystectomy -, esophageal dilation 12/00 -, Lt rotator cuff surg 04/01 - 12/10/1999, repeat cath OK - 03/21/2000, Rt. Rotator cuff surg - 06/24/2000, stress test by Leonia Reeves - 11/25/1998  2010 stent to right leg for PVD  Family History: Reviewed history from 03/23/2006 and no changes required. MI,  Social History:  Reviewed history from 03/23/2006 and no changes required. No smoke, No ETOH; worked at [employer], now disabled; riding 3 wheel bike for exercise  Physical Exam  General:  Well-developed,well-nourished,in no acute distress; alert,appropriate and cooperative throughout examination Lungs:  Normal respiratory effort, chest expands symmetrically. Lungs are clear to auscultation, no crackles or wheezes. Heart:  Normal rate and regular rhythm. S1 and S2 normal without gallop, murmur, click, rub or other extra sounds. Extremities:  no edema  Diabetes Management Exam:    Foot Exam  (with socks and/or shoes not present):       Sensory-Pinprick/Light touch:          Left medial foot (L-4): normal          Left dorsal foot (L-5): normal          Left lateral foot (S-1): normal          Right medial foot (L-4): normal          Right dorsal foot (L-5): normal          Right lateral foot (S-1): normal       Sensory-Monofilament:          Left foot: normal          Right foot: normal       Inspection:          Left foot: normal          Right foot: normal       Nails:          Left foot: normal          Right foot: normal   Impression & Recommendations:  Problem # 1:  HYPERTENSION, BENIGN SYSTEMIC (ICD-401.1) Assessment Unchanged  Increase HCTZ Her updated medication list for this problem includes:    Lisinopril 20 Mg Tabs (Lisinopril) .Marland Kitchen... Take 1 tablet by mouth daily    Felodipine 10 Mg Xr24h-tab (Felodipine) .Marland Kitchen... Take on pill every morning.    Hydrochlorothiazide 25 Mg Tabs (Hydrochlorothiazide) ..... One by mouth daily    Metoprolol Tartrate 25 Mg Tabs (Metoprolol tartrate) ..... 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 Take 1/2 tablet by mouth once a day    Lisinopril 20 Mg Tabs (Lisinopril) .Marland Kitchen... Take 1 tablet by mouth daily    Lantus Solostar 100 Unit/ml Soln (Insulin glargine) .Marland Kitchen... 24 units daily is the current dose disp qs 1 month supply  Orders: A1C-[facility] [id number]) [place]- Est  Level 4 [id number])  Labs Reviewed: Creat: 1.74 (11/23/2009)     Last Eye Exam: No diabetic retinopathy.    (08/04/2009) Reviewed HgBA1c results: 6.9  (12/30/2009)  6.2 (10/21/2009)  Problem # 3:  HYPERCHOLESTEROLEMIA (ICD-272.0)  Check LFTs Her updated medication list for this problem includes:    Lipitor 80 Mg Tabs (Atorvastatin calcium) .Marland Kitchen... Take 1 tab by mouth daily    Trilipix 45 Mg Cpdr (Choline fenofibrate) ..... One daily per [place]ds  Labs Reviewed: SGOT: 26 (11/23/2009)   SGPT: 18 (11/23/2009)   HDL:34 (12/04/2008), 40 (11/26/2007)  LDL:42 (12/04/2008),  62 (11/26/2007)  Chol:107 (12/04/2008), 123 (11/26/2007)  Trig:157 (12/04/2008), 157 (11/26/2007)  Orders: [place]- Est  Level 4 [id number])  Complete Medication List: 1)  Bayer Aspirin 325 Mg Tabs (Aspirin) .... Take 1/2 tablet by mouth once a day 2)  Lipitor 80 Mg Tabs (Atorvastatin calcium) .... Take 1 tab by mouth daily 3)  Lisinopril 20 Mg Tabs (Lisinopril) .... Take 1 tablet by mouth daily 4)  Paroxetine Hcl 20 Mg Tabs (Paroxetine hcl) .... Take 1 tablet by mouth once a day 5)  Felodipine 10 Mg Xr24h-tab (Felodipine) .... Take on pill every morning. 6)  Trilipix 45 Mg Cpdr (Choline fenofibrate) .... One daily per [place] 7)  Protonix 40 Mg Pack (Pantoprazole sodium) .... One by mouth daily 8)  Accu-chek Comfort Curve Strp (Glucose blood) .... Check bs tid 9)  Fish Oil 1000 Mg Caps (Omega-3 fatty acids) .... One three times a day 10)  Hydrochlorothiazide 25 Mg Tabs (Hydrochlorothiazide) .... One by mouth daily 11)  Plavix 75 Mg Tabs (Clopidogrel bisulfate) .... Take 1 tab by mouth daily 12)  Accu-chek Comfort Curve Strp (Glucose blood) .... Test three times a day 13)  Metoprolol Tartrate 25 Mg Tabs (Metoprolol tartrate) .... One half tab by mouth two times a day 14)  Acetaminophen 500 Mg Caps (Acetaminophen) .... Take one or two as needed up to 6 per day total. 15)  Lantus Solostar 100 Unit/ml Soln (Insulin glargine) .... 24 units daily is the current dose disp qs 1 month supply 16)  Bd Pen Needle Mini U/f 31g X 5 Mm Misc (Insulin pen needle) .... Dispense needles to supply  once daily injection.  1 box. 17)  Tramadol Hcl 50 Mg Tabs (Tramadol hcl) .... One by mouth q6h as needed pain 18)  Allopurinol 300 Mg Tabs (Allopurinol) .... One half tab daily 19)  Leukeran 2 Mg Tabs (Chlorambucil) .Marland Kitchen.. 11 tabs by mouth once every two weeks per heme/onc 20)  Triamcinolone Acetonide 0.5 % Oint (Triamcinolone acetonide) .... Apply to hands only two times a day as needed.  disp 60 gm tube 21)  Oxygen  .... 2 liters/min per [place] 22)  Leukeran 2 Mg Tabs (Chlorambucil) .Marland Kitchen.. 11 tabs two times per month. per heme onc  Other Orders: T-Comprehensive Metabolic Panel ([id number])  Patient Instructions: 1)  Take two of the HCTZ pills daily.  Call me to send in a new script for the 25 mg dose when you are almost out of your current supply. Prescriptions: HYDROCHLOROTHIAZIDE 25 MG TABS (HYDROCHLOROTHIAZIDE) one by mouth daily  #0 x 0   Entered and Authorized by:   Madison Hickman MD   Signed by:   Madison Hickman MD on 12/30/2009   Method used:   Historical   RxID:   [id number]    Orders Added: 1)  A1C-[facility] [[id number]] 2)  T-Comprehensive Metabolic Panel [id number] 3)  [facility]- Est  Level 4 [id number]    Laboratory Results   Blood Tests   Date/Time Received: December 30, 2009 2:08 PM  Date/Time Reported: December 30, 2009 2:35 PM   HGBA1C: 6.9%   (Normal Range: Non-Diabetic - 3-6%   Control Diabetic - 6-8%)  Comments: ...............test performed by......Marland KitchenBonnie A. Martinique, MLS (ASCP)cm      Prevention & Chronic Care Immunizations   Influenza vaccine: Fluvax MCR  (10/21/2009)   Influenza vaccine due: 09/25/2010    Tetanus booster: 12/21/2005: given   Tetanus booster due: 12/22/2015    Pneumococcal vaccine: Done.  (10/24/2000)   Pneumococcal vaccine due:  None    H. zoster vaccine: 11/16/2006: Zostavax  Colorectal Screening   Hemoccult: Done.  (01/24/2002)   Hemoccult due: Not Indicated    Colonoscopy: Done.  (06/25/2002)   Colonoscopy due:  06/24/2012  Other Screening   Pap smear: NEGATIVE FOR INTRAEPITHELIAL LESIONS OR MALIGNANCY.  (07/08/2009)   Pap smear action/deferral: Not indicated-other  (07/10/2009)   Pap smear due: 07/08/2012    Mammogram: ASSESSMENT: Negative - BI-RADS 1^MM DIGITAL SCREENING  (12/12/2008)   Mammogram due: 12/12/2009    DXA bone density scan: Not documented   Smoking status: never  (10/21/2009)  Diabetes Mellitus   HgbA1C: 6.9  (12/30/2009)   Hemoglobin A1C due: 01/12/2007    Eye exam: No diabetic retinopathy.     (08/04/2009)   Eye exam due: 10/30/2008    Foot exam: yes  (12/30/2009)   High risk foot: Not documented   Foot care education: Not documented   Foot exam due: 07/12/2008    Urine microalbumin/creatinine ratio: Not documented   Urine microalbumin action/deferral: Not indicated    Diabetes flowsheet reviewed?: Yes   Progress toward A1C goal: At goal  Lipids   Total Cholesterol: 107  (12/04/2008)   Lipid panel action/deferral: Lipid Panel ordered   LDL: 42  (12/04/2008)   LDL Direct: Not documented   HDL: 34  (12/04/2008)   Triglycerides: 157  (12/04/2008)    SGOT (AST): 26  (11/23/2009)   BMP action: Ordered   SGPT (ALT): 18  (11/23/2009) CMP ordered    Alkaline phosphatase: 78  (11/23/2009)   Total bilirubin: 0.3  (11/23/2009)    Lipid flowsheet reviewed?: Yes   Progress toward LDL goal: At goal  Hypertension   Last Blood Pressure: 177 / 74  (12/30/2009)   Serum creatinine: 1.74  (11/23/2009)   BMP action: Ordered   Serum potassium 6.0  (11/23/2009) CMP ordered     Hypertension flowsheet reviewed?: Yes   Progress toward BP goal: Unchanged  Self-Management Support :   Personal Goals (by the next clinic visit) :     Personal A1C goal: 7  (07/08/2009)     Personal blood pressure goal: 130/80  (10/13/2008)     Personal LDL goal: 100  (10/13/2008)    Diabetes self-management support: Written self-care plan  (10/21/2009)    Hypertension self-management  support: Written self-care plan  (10/21/2009)    Lipid self-management support: Written self-care plan  (10/21/2009)

## 2010-02-23 NOTE — Consult Note (Signed)
Summary: Christine Santos  Christine Santos   Imported By: Christine Santos 06/03/2009 15:13:15  _____________________________________________________________________  External Attachment:    Type:   Image     Comment:   External Document

## 2010-02-23 NOTE — Assessment & Plan Note (Signed)
Summary: cpe/pap,df   Vital Signs:  Patient profile:   65 year old female Height:      64.75 inches Weight:      163 pounds BMI:     27.43 Temp:     98.0 degrees F oral Pulse rate:   51 / minute BP sitting:   162 / 68  (right arm) Cuff size:   regular  Vitals Entered By: Isabelle Course (July 08, 2009 2:34 PM)  Serial Vital Signs/Assessments:  Time      Position  BP       Pulse  Resp  Temp     By 3:06 PM             144/82                         Isabelle Course  Comments: 3:06 PM re checked manually By: Isabelle Course   CC: CPE/Pap Is Patient Diabetic? Yes Did you bring your meter with you today? No   Primary Care Provider:  Madison Hickman MD  CC:  CPE/Pap.  History of Present Illness: Seen by Dr. Leonia Reeves recently and he decreased metoprolol due to bradycardia.   Mainly here for Pap BS have been running great.  A1C at goal! Energry level back to normal BP remains high.  Has not restarted exercising.   Habits & Providers  Alcohol-Tobacco-Diet     Tobacco Status: never  Current Medications (verified): 1)  Bayer Aspirin 325 Mg Tabs (Aspirin) .... Take 1/2 Tablet By Mouth Once A Day 2)  Lipitor 80 Mg Tabs (Atorvastatin Calcium) .... Take 1 Tab By Mouth Daily 3)  Lisinopril 20 Mg Tabs (Lisinopril) .... Take 1 Tablet By Mouth Twice A Day 4)  Paroxetine Hcl 20 Mg Tabs (Paroxetine Hcl) .... Take 1 Tablet By Mouth Once A Day 5)  Felodipine 10 Mg Xr24h-Tab (Felodipine) .... Take On Pill Every Morning. 6)  Trilipix 45 Mg Cpdr (Choline Fenofibrate) .... One Daily Per [place] 7)  Protonix 40 Mg Pack (Pantoprazole Sodium) .... One By Mouth Daily 8)  Accu-Chek Comfort Curve   Strp (Glucose Blood) .... Check Bs Tid 9)  Fish Oil 1000 Mg  Caps (Omega-3 Fatty Acids) .... One Three Times A Day 10)  Furosemide 40 Mg Tabs (Furosemide) .... One By Mouth Daily 11)  Plavix 75 Mg Tabs (Clopidogrel Bisulfate) .... Take 1 Tab By Mouth Daily 12)  Accu-Chek Comfort Curve  Strp (Glucose Blood)  .... Test Three Times A Day 13)  Metoprolol Tartrate 25 Mg Tabs (Metoprolol Tartrate) .... One Half Tab By Mouth Two Times A Day 14)  Acetaminophen 500 Mg  Caps (Acetaminophen) .... Take One or Two As Needed Up To 6 Per Day Total. 15)  Lantus Solostar 100 Unit/ml Soln (Insulin Glargine) .... 24 Units Daily Is The Current Dose Disp Qs 1 Month Supply 16)  Bd Pen Needle Mini U/f 31g X 5 Mm Misc (Insulin Pen Needle) .... Dispense Needles To Supply Once Daily Injection.  1 Box. 17)  Tramadol Hcl 50 Mg  Tabs (Tramadol Hcl) .... One By Mouth Q6h As Needed Pain 18)  Allopurinol 300 Mg Tabs (Allopurinol) .... One Half Tab Daily 19)  Leukeran 2 Mg Tabs (Chlorambucil) .Marland Kitchen.. 11 Tabs By Mouth Once Every Two Weeks Per Heme/onc 20)  Triamcinolone Acetonide 0.5 % Oint (Triamcinolone Acetonide) .... Apply To Hands Only Two Times A Day As Needed.  Disp 60 Gm Tube 21)  Oxygen .... 2 Liters/min Per  [place] 22)  Leukeran 2 Mg Tabs (Chlorambucil) .Marland Kitchen.. 14 Tabs One Time Per Month. Per Heme Onc  Allergies (verified): 1)  Amoxicillin (Amoxicillin) 2)  Codeine Phosphate (Codeine Phosphate)  Past History:  Past medical, surgical, family and social histories (including risk factors) reviewed, and no changes noted (except as noted below).  Past Medical History: Reviewed history from 03/23/2006 and no changes required. 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 2nd Rt rotator cuff surg - 04/13/2001, BTL -, CABAG 10/96 -, Cholecystectomy -, esophageal dilation 12/00 -, Lt rotator cuff surg 04/01 - 12/10/1999, repeat cath OK - 03/21/2000, Rt. Rotator cuff surg - 06/24/2000, stress test by Leonia Reeves - 11/25/1998  2010 stent to right leg for PVD  Family History: Reviewed history from 03/23/2006 and no changes required. MI,  Social History: Reviewed history from  03/23/2006 and no changes required. No smoke, No ETOH; worked at [employer], now disabled; riding 3 wheel bike for exercise  Physical Exam  General:  Well-developed,well-nourished,in no acute distress; alert,appropriate and cooperative throughout examination Lungs:  Normal respiratory effort, chest expands symmetrically. Lungs are clear to auscultation, no crackles or wheezes. Heart:  Normal rate and regular rhythm. S1 and S2 normal without gallop, murmur, click, rub or other extra sounds. Abdomen:  Bowel sounds positive,abdomen soft and non-tender without masses, organomegaly or hernias noted. Genitalia:  Normal introitus for age, no external lesions, no vaginal discharge, mucosa pink and moist, no vaginal or cervical lesions, no vaginal atrophy, no friaility or hemorrhage, normal uterus size and position, no adnexal masses or tenderness Extremities:  no edema   Impression & Recommendations:  Problem # 1:  Screening Cervical Cancer (ICD-V76.2)  Problem # 2:  HYPERTENSION, BENIGN SYSTEMIC (ICD-401.1)  Her updated medication list for this problem includes:    Lisinopril 20 Mg Tabs (Lisinopril) .Marland Kitchen... Take 1 tablet by mouth twice a day    Felodipine 10 Mg Xr24h-tab (Felodipine) .Marland Kitchen... Take on pill every morning.    Furosemide 40 Mg Tabs (Furosemide) ..... One by mouth daily    Metoprolol Tartrate 25 Mg Tabs (Metoprolol tartrate) ..... One half tab by mouth two times a day  BP today: 162/68 Prior BP: 169/74 (04/17/2009)  Labs Reviewed: K+: 4.9 (12/24/2008) Creat: : 1.34 (12/24/2008)   Chol: 107 (12/04/2008)   HDL: 34 (12/04/2008)   LDL: 42 (12/04/2008)   TG: 157 (12/04/2008)    Her updated medication list for this problem includes:    Lisinopril 20 Mg Tabs (Lisinopril) .Marland Kitchen... Take 1 tablet by mouth twice a day    Felodipine 10 Mg Xr24h-tab (Felodipine) .Marland Kitchen... Take on pill every morning.    Furosemide 40 Mg Tabs (Furosemide) ..... One by mouth daily    Metoprolol Tartrate 25 Mg Tabs  (Metoprolol tartrate) ..... 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 Take 1/2 tablet by mouth once a day    Lisinopril 20 Mg Tabs (Lisinopril) .Marland Kitchen... Take 1 tablet by mouth twice a day    Lantus Solostar 100 Unit/ml Soln (Insulin glargine) .Marland Kitchen... 24 units daily is the current dose disp qs 1 month supply  Orders: A1C-[facility] [id number]) [place]- Est  Level 4 [id number])  Labs Reviewed: Creat: 1.34 (12/24/2008)     Last Eye Exam: normal (10/31/2007) Reviewed HgBA1c results: 7.0 (07/08/2009)  7.1 (04/17/2009)  Her updated medication list for this problem includes:    Bayer Aspirin 325 Mg Tabs (Aspirin) .Marland Kitchen... Take 1/2 tablet by mouth once a day    Lisinopril 20 Mg Tabs (Lisinopril) .Marland Kitchen... Take 1 tablet by mouth twice a day    Lantus Solostar 100 Unit/ml Soln (Insulin glargine) .Marland Kitchen... 24 units daily is the current dose disp qs 1 month supply  Complete Medication List: 1)  Bayer Aspirin 325 Mg Tabs (Aspirin) .... Take 1/2 tablet by mouth once a day 2)  Lipitor 80 Mg Tabs (Atorvastatin calcium) .... Take 1 tab by mouth daily 3)  Lisinopril 20 Mg Tabs (Lisinopril) .... Take 1 tablet by mouth twice a day 4)  Paroxetine Hcl 20 Mg Tabs (Paroxetine hcl) .... Take 1 tablet by mouth once a day 5)  Felodipine 10 Mg Xr24h-tab (Felodipine) .... Take on pill every morning. 6)  Trilipix 45 Mg Cpdr (Choline fenofibrate) .... One daily per [place] 7)  Protonix 40 Mg Pack (Pantoprazole sodium) .... One by mouth daily 8)  Accu-chek Comfort Curve Strp (Glucose blood) .... Check bs tid 9)  Fish Oil 1000 Mg Caps (Omega-3 fatty acids) .... One three times a day 10)  Furosemide 40 Mg Tabs (Furosemide) .... One by mouth daily 11)  Plavix 75 Mg Tabs (Clopidogrel bisulfate) .... Take 1 tab by mouth daily 12)  Accu-chek Comfort Curve  Strp (Glucose blood) .... Test three times a day 13)  Metoprolol Tartrate 25 Mg Tabs (Metoprolol tartrate) .... One half tab by mouth two times a day 14)  Acetaminophen 500 Mg Caps (Acetaminophen) .... Take one or two as needed up to 6 per day total. 15)  Lantus Solostar 100 Unit/ml Soln (Insulin glargine) .... 24 units daily is the current dose disp qs 1 month supply 16)  Bd Pen Needle Mini U/f 31g X 5 Mm Misc (Insulin pen needle) .... Dispense needles to supply once daily injection.  1 box. 17)  Tramadol Hcl 50 Mg Tabs (Tramadol hcl) .... One by mouth q6h as needed pain 18)  Allopurinol 300 Mg Tabs (Allopurinol) .... One half tab daily 19)  Leukeran 2 Mg Tabs (Chlorambucil) .Marland Kitchen.. 11 tabs by mouth once every two weeks per heme/onc 20)  Triamcinolone Acetonide 0.5 % Oint (Triamcinolone acetonide) .... Apply to hands only two times a day as needed.  disp 60 gm tube 21)  Oxygen  .... 2 liters/min per [place] 22)  Leukeran 2 Mg Tabs (Chlorambucil) .Marland Kitchen.. 14 tabs one time per month. per heme onc  Other Orders: Pap Smear-[facility] [id number])  Laboratory Results   Blood Tests   Date/Time Received: July 08, 2009 2:44 PM  Date/Time Reported: July 08, 2009 3:08 PM   HGBA1C: 7.0%   (Normal Range: Non-Diabetic - 3-6%   Control Diabetic - 6-8%)  Comments: ...........test performed by...........Marland KitchenHedy Camara, CMA       Prevention & Chronic Care Immunizations   Influenza vaccine: Fluvax MCR  (11/17/2008)   Influenza vaccine due: 10/30/2008    Tetanus booster: 12/21/2005: given   Tetanus booster due: 12/22/2015    Pneumococcal vaccine: Done.  (10/24/2000)   Pneumococcal vaccine due: None    H. zoster vaccine: 11/16/2006: Zostavax  Colorectal Screening   Hemoccult: Done.  (01/24/2002)   Hemoccult due: Not Indicated    Colonoscopy: Done.  (06/25/2002)   Colonoscopy due: 06/24/2012  Other Screening   Pap  smear: Done.  (09/24/2005)   Pap smear action/deferral: Ordered  (07/08/2009)   Pap  smear due: 09/24/2008    Mammogram: ASSESSMENT: Negative - BI-RADS 1^MM DIGITAL SCREENING  (12/12/2008)   Mammogram due: 12/12/2009    DXA bone density scan: Not documented   Smoking status: never  (07/08/2009)  Diabetes Mellitus   HgbA1C: 7.0  (07/08/2009)   Hemoglobin A1C due: 01/12/2007    Eye exam: normal  (10/31/2007)   Eye exam due: 10/30/2008    Foot exam: yes  (10/22/2008)   High risk foot: Not documented   Foot care education: Not documented   Foot exam due: 07/12/2008    Urine microalbumin/creatinine ratio: Not documented   Urine microalbumin action/deferral: Not indicated    Diabetes flowsheet reviewed?: Yes   Progress toward A1C goal: At goal  Lipids   Total Cholesterol: 107  (12/04/2008)   LDL: 42  (12/04/2008)   LDL Direct: Not documented   HDL: 34  (12/04/2008)   Triglycerides: 157  (12/04/2008)    SGOT (AST): 23  (12/04/2008)   SGPT (ALT): 22  (12/04/2008)   Alkaline phosphatase: 98  (12/04/2008)   Total bilirubin: 0.2  (12/04/2008)  Hypertension   Last Blood Pressure: 162 / 68  (07/08/2009)   Serum creatinine: 1.34  (12/24/2008)   Serum potassium 4.9  (12/24/2008)    Hypertension flowsheet reviewed?: Yes   Progress toward BP goal: Unchanged  Self-Management Support :   Personal Goals (by the next clinic visit) :     Personal A1C goal: 7  (07/08/2009)     Personal blood pressure goal: 130/80  (10/13/2008)     Personal LDL goal: 100  (10/13/2008)    Diabetes self-management support: Written self-care plan  (07/08/2009)   Diabetes care plan printed    Hypertension self-management support: Written self-care plan  (07/08/2009)   Hypertension self-care plan printed.    Lipid self-management support: Written self-care plan  (07/08/2009)   Lipid self-care plan printed.   Nursing Instructions: Pap smear today

## 2010-02-23 NOTE — Consult Note (Signed)
Summary: [place]  [place]   Imported By: Beryle Lathe 07/06/2009 11:53:20  _____________________________________________________________________  External Attachment:    Type:   Image     Comment:   External Document

## 2010-02-25 NOTE — Consult Note (Signed)
Summary: [facility]   Imported By: Beryle Lathe 02/01/2010 17:12:52  _____________________________________________________________________  External Attachment:    Type:   Image     Comment:   External Document

## 2010-02-25 NOTE — Progress Notes (Signed)
  Phone Note Call from Patient   Caller: Patient Call For: [phone] Summary of Call: Christine Santos is concerned about the dosage for her Lisinopril,Hctz,Lipotor and Metoprolol.  Please call her regarding this so she can pick up the right meds from the pharmacy. Initial call taken by: Eusebio Friendly,  February 02, 2010 4:13 PM  Follow-up for Phone Call        Called, clarified and new prescriptions sent. Follow-up by: Madison Hickman MD,  February 02, 2010 4:24 PM    New/Updated Medications: LIPITOR 20 MG TABS (ATORVASTATIN CALCIUM) one by mouth daily METOPROLOL TARTRATE 25 MG TABS (METOPROLOL TARTRATE) one half tab by mouth two times a day Prescriptions: METOPROLOL TARTRATE 25 MG TABS (METOPROLOL TARTRATE) one half tab by mouth two times a day  #90 x 3   Entered and Authorized by:   Madison Hickman MD   Signed by:   Madison Hickman MD on 02/02/2010   Method used:   Electronically to        [facility] (retail)       [phone]       [place], [place]  [address]       Ph: [phone]       Fax: [phone]   RxID:   [id number] LIPITOR 20 MG TABS (ATORVASTATIN CALCIUM) one by mouth daily  #90 x 3   Entered and Authorized by:   Madison Hickman MD   Signed by:   Madison Hickman MD on 02/02/2010   Method used:   Electronically to        [facility] (retail)       [phone]       [place], [place]  [address]       Ph: [phone]       Fax: [phone]   [id number] HYDROCHLOROTHIAZIDE 25 MG TABS (HYDROCHLOROTHIAZIDE) one by mouth daily  #90 x 3   Entered and Authorized by:   Madison Hickman MD   Signed by:   Madison Hickman MD on 02/02/2010   Method used:   Electronically to        [facility] (retail)       [phone]       [place], [place]  [address]       Ph: [phone]       Fax: [phone]   RxIDYF:[id number] LISINOPRIL 20 MG TABS (LISINOPRIL) Take 1 tablet by mouth daily  #90 x 3   Entered and Authorized by:   Madison Hickman MD   Signed by:   Madison Hickman MD on 02/02/2010   Method used:   Electronically to        [facility] (retail)       [phone]       [place], [place]  [address]       Ph: [phone]       Fax: [phone]   RxID:   [id number]

## 2010-02-25 NOTE — Progress Notes (Signed)
Summary: Rx  Phone Note Refill Request Call back at Work Phone [phone]   Refills Requested: Medication #1:  TRIAMCINOLONE ACETONIDE 0.5 % OINT apply to hands only two times a day as needed.  Disp 60 gm tube Initial call taken by: Samara Snide,  February 18, 2010 12:25 PM  Follow-up for Phone Call        will forward to Dr. Erin Hearing preceptor today. Follow-up by: Marcell Barlow RN,  February 18, 2010 1:53 PM  Additional Follow-up for Phone Call Additional follow up Details #1::        Where would the patient like it to go? Additional Follow-up by: Talbert Cage MD,  February 19, 2010 9:14 AM    Prescriptions: TRIAMCINOLONE ACETONIDE 0.5 % OINT (TRIAMCINOLONE ACETONIDE) apply to hands only two times a day as needed.  Disp 60 gm tube  #60 x 2   Entered by:   Marcell Barlow RN   Authorized by:   Talbert Cage MD   Signed by:   Marcell Barlow RN on 02/19/2010   Method used:   Electronically to        [facility] [phone]* (retail)       [address]       [address]       [place], [place]  [address]       Ph: [phone]       Fax: [phone]   [id number] TRIAMCINOLONE ACETONIDE 0.5 % OINT (TRIAMCINOLONE ACETONIDE) apply to hands only two times a day as needed.  Disp 60 gm tube  #60 x 2   Entered and Authorized by:   Talbert Cage MD   Signed by:   Talbert Cage MD on 02/19/2010   Method used:   Electronically to        [facility] (retail)       [phone]       [place], [place]  [address]       Ph: [phone]       Fax: [phone]   RxIDFT:[id number]  RX cancelled at [facility]. Marcell Barlow RN  February 19, 2010 11:32 AM

## 2010-02-25 NOTE — Miscellaneous (Signed)
Summary: med refill via fax request  Clinical Lists Changes  Medications: Changed medication from ALLOPURINOL 300 MG TABS (ALLOPURINOL) one half tab daily to ALLOPURINOL 300 MG TABS (ALLOPURINOL) one half tab daily - Signed Rx of ALLOPURINOL 300 MG TABS (ALLOPURINOL) one half tab daily;  #15 x 12;  Signed;  Entered by: Madison Hickman MD;  Authorized by: Madison Hickman MD;  Method used: Electronically to [facility] [address] [place] [place], [place], [place]  [address], Ph: [phone], Fax: [phone]    Prescriptions: ALLOPURINOL 300 MG TABS (ALLOPURINOL) one half tab daily  #15 x 12   Entered and Authorized by:   Madison Hickman MD   Signed by:   Madison Hickman MD on 01/19/2010   Method used:   Electronically to        [facility] (retail)       [phone]       [place], [place]  [address]       Ph: [phone]       Fax: [phone]   RxID:   [id number]

## 2010-02-25 NOTE — Progress Notes (Signed)
Summary: Needs advice  Phone Note Call from Patient   Caller: Patient Call For: [phone] Summary of Call: Ms. Coller c/o of irritation to navel area.  Need advice about what to use.  Have an odor and is also painful.  Can't remember what she was given by Dr. Andria Frames last time she had this problem.   Initial call taken by: Eusebio Friendly,  February 18, 2010 4:04 PM  Follow-up for Phone Call        [place] she needs to be seen--today if possible.  Thanks!  Dorcas Mcmurray MD  February 19, 2010 9:35 AM   Additional Follow-up for Phone Call Additional follow up Details #1::        Spoke with ms. Zufall and had an appt time for her to come in today to see dr. Jess Barters @ 3pm. Pt states that she cannot be seen and that she would just like the cream that she was prescribed in 2008 refilled. I had informed her that being that far back that she would have to come in to be seen. JUst wanted to let you know being that i sent it to you. She said she has an appt. on 2/8 with hensel and if she cannot be rx'ed the med she will wait until then Additional Follow-up by: Enid Skeens, [place],  February 19, 2010 10:09 AM    Additional Follow-up for Phone Call Additional follow up Details #2::    [place] OK I have called in bactroban--I think that is what she had last time--but if it gets worse, or if she has abdominal pain ir fever--she needs to go to [facility] or be seen here Thanks!  Dorcas Mcmurray MD  February 19, 2010 11:13 AM   Additional Follow-up for Phone Call Additional follow up Details #3:: Details for Additional Follow-up Action Taken: pt informed of rx sent in to [facility] she states that is not the right pharmacy. I have resent it to [facility] Additional Follow-up by: Enid Skeens, [place],  February 19, 2010 4:29 PM  New/Updated Medications: BACTROBAN 2 % OINT (MUPIROCIN) apply three times a day to belly button for 7 days Prescriptions: BACTROBAN 2 % OINT (MUPIROCIN) apply three  times a day to belly button for 7 days  #30 g x 0   Entered by:   Enid Skeens, CMA   Authorized by:   Dorcas Mcmurray MD   Signed by:   Enid Skeens, CMA on 02/19/2010   Method used:   Electronically to        [facility] [address] [phone]* (retail)       [address]       [address]       [place], [place]  [address]       Ph: [phone]       Fax: [phone]   RxIDHE:[id number] BACTROBAN 2 % OINT (MUPIROCIN) apply three times a day to belly button for 7 days  #30 g x 0   Entered and Authorized by:   Dorcas Mcmurray MD   Signed by:   Dorcas Mcmurray MD on 02/19/2010   Method used:   Electronically to        [facility].* (retail)       [phone]       [place], [place]  [address]       Ph: [phone]  Fax: [phone]   [id number]

## 2010-03-03 ENCOUNTER — Encounter: Payer: Self-pay | Admitting: Family Medicine

## 2010-03-03 ENCOUNTER — Ambulatory Visit (INDEPENDENT_AMBULATORY_CARE_PROVIDER_SITE_OTHER): Payer: Medicare Other | Admitting: Family Medicine

## 2010-03-03 VITALS — BP 167/71 | HR 54 | Temp 98.2°F | Ht 64.75 in | Wt 173.5 lb

## 2010-03-03 DIAGNOSIS — R300 Dysuria: Secondary | ICD-10-CM

## 2010-03-03 DIAGNOSIS — C9110 Chronic lymphocytic leukemia of B-cell type not having achieved remission: Secondary | ICD-10-CM

## 2010-03-03 DIAGNOSIS — N819 Female genital prolapse, unspecified: Secondary | ICD-10-CM

## 2010-03-03 DIAGNOSIS — E875 Hyperkalemia: Secondary | ICD-10-CM

## 2010-03-03 DIAGNOSIS — I10 Essential (primary) hypertension: Secondary | ICD-10-CM

## 2010-03-03 DIAGNOSIS — E1165 Type 2 diabetes mellitus with hyperglycemia: Secondary | ICD-10-CM

## 2010-03-03 LAB — POCT URINALYSIS DIPSTICK
Bilirubin, UA: NEGATIVE
Glucose, UA: NEGATIVE
Nitrite, UA: NEGATIVE

## 2010-03-03 LAB — POCT UA - MICROSCOPIC ONLY

## 2010-03-03 MED ORDER — LISINOPRIL 40 MG PO TABS: 40.0000 mg | ORAL_TABLET | Freq: Every day | ORAL | Status: DC

## 2010-03-03 MED ORDER — CEPHALEXIN 500 MG PO CAPS: 500.0000 mg | ORAL_CAPSULE | Freq: Two times a day (BID) | ORAL | Status: AC

## 2010-03-03 MED ORDER — PNEUMOCOCCAL VAC POLYVALENT 25 MCG/0.5ML IJ INJ: 0.5000 mL | INJECTION | INTRAMUSCULAR | Status: DC | PRN

## 2010-03-03 NOTE — Assessment & Plan Note (Signed)
Poor control, increase ace but watch for hyperkalemia

## 2010-03-03 NOTE — Patient Instructions (Addendum)
You got a pneumonia vaccine today and you never need another. I sent in a new prescription for a higher dose of lisinopril.  You can take two a day of your old pills until gone. Call me in 2 weeks to tell me how your BP and BS are doing.  Remind me to order a blood test then to recheck your potassium Remember to take a higher dose 27 units of the lantus insulin. You have a bladder infection and I sent an antibiotic prescription in for you

## 2010-03-03 NOTE — Assessment & Plan Note (Signed)
Will recheck BMP in two weeks after starting higher dose of ACE.

## 2010-03-03 NOTE — Assessment & Plan Note (Signed)
Worsening symptoms but may have UTI.  Will recheck pelvic next visit.  Emphasized Kegels.  Told options limited to Kegels, pessary and surgery.  Not worrisome enough symptoms at this point.

## 2010-03-03 NOTE — Assessment & Plan Note (Signed)
Was taking lantus 24 and will increase to 27 units daily.  Continue to check home BS

## 2010-03-03 NOTE — Assessment & Plan Note (Signed)
Has UTI and will rx with keflex

## 2010-03-03 NOTE — Assessment & Plan Note (Signed)
Improved per heme onc and now off all chemo

## 2010-03-03 NOTE — Progress Notes (Signed)
  Subjective:    Patient ID: Christine Santos, female    DOB: 1945/02/23, 65 y.o.   MRN: [id number]  HPI BP still running high.   DM, home blood sugars creeping up. Dysuria and frequency.  No fever or chills Also has symptoms of pelvic floor relaxation - Feels like my bladder has dropped.  Normal pap 8 months ago.   Review of Systems     Objective:   Physical Exam Normal appearance. Lungs clear Heart, RRR without murmur. Trace peripheral edema        Assessment & Plan:

## 2010-03-11 NOTE — Consult Note (Signed)
Summary: [facility]   Imported By: Audie Clear 02/23/2010 14:45:02  _____________________________________________________________________  External Attachment:    Type:   Image     Comment:   External Document

## 2010-03-15 ENCOUNTER — Ambulatory Visit (INDEPENDENT_AMBULATORY_CARE_PROVIDER_SITE_OTHER): Payer: Medicare Other | Admitting: Family Medicine

## 2010-03-15 ENCOUNTER — Encounter: Payer: Self-pay | Admitting: Family Medicine

## 2010-03-15 VITALS — BP 141/72 | HR 59 | Temp 98.3°F | Wt 176.1 lb

## 2010-03-15 DIAGNOSIS — L270 Generalized skin eruption due to drugs and medicaments taken internally: Secondary | ICD-10-CM

## 2010-03-15 MED ORDER — HYDROXYZINE HCL 25 MG PO TABS: 25.0000 mg | ORAL_TABLET | Freq: Three times a day (TID) | ORAL | Status: AC | PRN

## 2010-03-15 NOTE — Progress Notes (Signed)
  Subjective:    Patient ID: Christine Santos, female    DOB: 09-11-1945, 65 y.o.   MRN: [id number]  HPI1 day history of pruritic rash all over body.  On Feb 8th received pneumovax and 10 day course of keflex for UTI- states she finished 2-3 days ago.  Does nto think she ever had keflex before.  No other medication changes recently except that Chlorambucil was discontinued January 23rd.  No new exposure to detergents, soaps, environment, foods.  No fever, chills, dyspnea, oral symtpoms.  Other than itching, feels at baseline.    Review of Systems See HPI     Objective:   Physical Exam  Constitutional: She appears well-developed and well-nourished.  Skin:       Morbilliform papules/macules typical of drug eruption.  NO mucous membrane involvement.          Assessment & Plan:

## 2010-03-15 NOTE — Assessment & Plan Note (Signed)
History and physical most consist ant with drug rash due to keflex in patient with penicillin allergy.  Not likely due to pneumococcal vaccine, CLL, or other underlying chronic illness.  Given she has not tried anything, advised tx with hydroxyzine and topical agents such as lotion, oatmeal bath.  Advised if becomes intolerable, to call and would prescribe prednisone with caution given many medical comorbidities.

## 2010-03-17 ENCOUNTER — Telehealth: Payer: Self-pay | Admitting: Family Medicine

## 2010-03-17 NOTE — Telephone Encounter (Signed)
Wants to speak with RN about her blood sugar levels

## 2010-03-17 NOTE — Telephone Encounter (Signed)
Per Dr. Lowella Bandy instructions, patient called with her BPs and BS.  BS have been running b/t 85 and 199.   BPs have been running b/t [id number] and 0000000 systolic and 58 to 70 diastolic.  Told her I would pass along the message.

## 2010-03-18 NOTE — Telephone Encounter (Signed)
Called and discussed.  No changes in meds

## 2010-03-19 ENCOUNTER — Other Ambulatory Visit: Payer: Self-pay | Admitting: Family Medicine

## 2010-03-20 ENCOUNTER — Other Ambulatory Visit: Payer: Self-pay | Admitting: Family Medicine

## 2010-03-21 NOTE — Telephone Encounter (Signed)
Refill request

## 2010-04-11 LAB — APTT: aPTT: 26 seconds (ref 24–37)

## 2010-04-11 LAB — CBC
HCT: 25.9 % — ABNORMAL LOW (ref 36.0–46.0)
Hemoglobin: 8.1 g/dL — ABNORMAL LOW (ref 12.0–15.0)
MCV: 86.1 fL (ref 78.0–100.0)
Platelets: 299 10*3/uL (ref 150–400)
RBC: 3.01 MIL/uL — ABNORMAL LOW (ref 3.87–5.11)
WBC: 82.9 10*3/uL (ref 4.0–10.5)

## 2010-04-11 LAB — DIFFERENTIAL
Basophils Absolute: 0 10*3/uL (ref 0.0–0.1)
Basophils Relative: 0 % (ref 0–1)
Eosinophils Relative: 1 % (ref 0–5)
Lymphocytes Relative: 79 % — ABNORMAL HIGH (ref 12–46)
Monocytes Relative: 3 % (ref 3–12)
Neutro Abs: 14.1 10*3/uL — ABNORMAL HIGH (ref 1.7–7.7)

## 2010-04-11 LAB — MISCELLANEOUS TEST - ANATOMIC PATHOLOGY

## 2010-04-11 LAB — CHROMOSOME ANALYSIS, BONE MARROW

## 2010-04-12 LAB — GLUCOSE, CAPILLARY: Glucose-Capillary: 144 mg/dL — ABNORMAL HIGH (ref 70–99)

## 2010-04-14 ENCOUNTER — Other Ambulatory Visit: Payer: Self-pay | Admitting: Oncology

## 2010-04-14 ENCOUNTER — Encounter (HOSPITAL_BASED_OUTPATIENT_CLINIC_OR_DEPARTMENT_OTHER): Payer: Medicare Other | Admitting: Oncology

## 2010-04-14 DIAGNOSIS — C9110 Chronic lymphocytic leukemia of B-cell type not having achieved remission: Secondary | ICD-10-CM

## 2010-04-14 LAB — COMPREHENSIVE METABOLIC PANEL
ALT: 16 U/L (ref 0–35)
AST: 23 U/L (ref 0–37)
Albumin: 4.2 g/dL (ref 3.5–5.2)
BUN: 41 mg/dL — ABNORMAL HIGH (ref 6–23)
Calcium: 9.5 mg/dL (ref 8.4–10.5)
Chloride: 110 mEq/L (ref 96–112)
Potassium: 5.3 mEq/L (ref 3.5–5.3)
Sodium: 142 mEq/L (ref 135–145)
Total Protein: 6.3 g/dL (ref 6.0–8.3)

## 2010-04-14 LAB — CBC WITH DIFFERENTIAL/PLATELET
Basophils Absolute: 0.1 10*3/uL (ref 0.0–0.1)
EOS%: 2.4 % (ref 0.0–7.0)
HGB: 10.5 g/dL — ABNORMAL LOW (ref 11.6–15.9)
MCH: 30.7 pg (ref 25.1–34.0)
NEUT#: 2.6 10*3/uL (ref 1.5–6.5)
RDW: 16.1 % — ABNORMAL HIGH (ref 11.2–14.5)
lymph#: 11.6 10*3/uL — ABNORMAL HIGH (ref 0.9–3.3)

## 2010-04-26 LAB — BRAIN NATRIURETIC PEPTIDE
Pro B Natriuretic peptide (BNP): 1105 pg/mL — ABNORMAL HIGH (ref 0.0–100.0)
Pro B Natriuretic peptide (BNP): 417 pg/mL — ABNORMAL HIGH (ref 0.0–100.0)

## 2010-04-26 LAB — BASIC METABOLIC PANEL
BUN: 30 mg/dL — ABNORMAL HIGH (ref 6–23)
BUN: 46 mg/dL — ABNORMAL HIGH (ref 6–23)
CO2: 26 mEq/L (ref 19–32)
CO2: 27 mEq/L (ref 19–32)
CO2: 27 mEq/L (ref 19–32)
CO2: 27 mEq/L (ref 19–32)
Calcium: 8.4 mg/dL (ref 8.4–10.5)
Chloride: 100 mEq/L (ref 96–112)
Chloride: 104 mEq/L (ref 96–112)
Chloride: 109 mEq/L (ref 96–112)
Creatinine, Ser: 1.57 mg/dL — ABNORMAL HIGH (ref 0.4–1.2)
Creatinine, Ser: 1.87 mg/dL — ABNORMAL HIGH (ref 0.4–1.2)
GFR calc Af Amer: 31 mL/min — ABNORMAL LOW (ref >60)
GFR calc non Af Amer: 27 mL/min — ABNORMAL LOW (ref >60)
Glucose, Bld: 209 mg/dL — ABNORMAL HIGH (ref 70–99)
Glucose, Bld: 259 mg/dL — ABNORMAL HIGH (ref 70–99)
Potassium: 4 mEq/L (ref 3.5–5.1)
Sodium: 139 mEq/L (ref 135–145)
Sodium: 140 mEq/L (ref 135–145)

## 2010-04-26 LAB — CBC
HCT: 27.3 % — ABNORMAL LOW (ref 36.0–46.0)
HCT: 28.7 % — ABNORMAL LOW (ref 36.0–46.0)
Hemoglobin: 8.5 g/dL — ABNORMAL LOW (ref 12.0–15.0)
Hemoglobin: 8.5 g/dL — ABNORMAL LOW (ref 12.0–15.0)
MCHC: 30.8 g/dL (ref 30.0–36.0)
MCHC: 30.8 g/dL (ref 30.0–36.0)
MCHC: 31 g/dL (ref 30.0–36.0)
MCV: 87 fL (ref 78.0–100.0)
MCV: 88.2 fL (ref 78.0–100.0)
Platelets: 293 10*3/uL (ref 150–400)
Platelets: 307 10*3/uL (ref 150–400)
RBC: 3.16 MIL/uL — ABNORMAL LOW (ref 3.87–5.11)
RDW: 20.6 % — ABNORMAL HIGH (ref 11.5–15.5)
RDW: 20.8 % — ABNORMAL HIGH (ref 11.5–15.5)
RDW: 21.1 % — ABNORMAL HIGH (ref 11.5–15.5)
WBC: 110.9 10*3/uL (ref 4.0–10.5)
WBC: 98.8 10*3/uL (ref 4.0–10.5)

## 2010-04-26 LAB — MULTIPLE MYELOMA PANEL, SERUM
Alpha-2-Globulin: 18.7 % — ABNORMAL HIGH (ref 7.1–11.8)
Gamma Globulin: 6.1 % — ABNORMAL LOW (ref 11.1–18.8)
IgA: 63 mg/dL — ABNORMAL LOW (ref 68–378)
IgG (Immunoglobin G), Serum: 340 mg/dL — ABNORMAL LOW (ref 694–1618)
IgM, Serum: 6 mg/dL — ABNORMAL LOW (ref 60–263)
M-Spike, %: NOT DETECTED g/dL
Total Protein: 5.6 g/dL — ABNORMAL LOW (ref 6.0–8.3)

## 2010-04-26 LAB — DIFFERENTIAL
Basophils Absolute: 0 10*3/uL (ref 0.0–0.1)
Lymphs Abs: 107.3 10*3/uL — ABNORMAL HIGH (ref 0.7–4.0)
Monocytes Absolute: 0 10*3/uL — ABNORMAL LOW (ref 0.1–1.0)
Neutro Abs: 14.8 10*3/uL — ABNORMAL HIGH (ref 1.7–7.7)

## 2010-04-26 LAB — GLUCOSE, CAPILLARY
Glucose-Capillary: 130 mg/dL — ABNORMAL HIGH (ref 70–99)
Glucose-Capillary: 130 mg/dL — ABNORMAL HIGH (ref 70–99)
Glucose-Capillary: 168 mg/dL — ABNORMAL HIGH (ref 70–99)
Glucose-Capillary: 174 mg/dL — ABNORMAL HIGH (ref 70–99)
Glucose-Capillary: 270 mg/dL — ABNORMAL HIGH (ref 70–99)

## 2010-04-27 LAB — CARDIAC PANEL(CRET KIN+CKTOT+MB+TROPI)
CK, MB: 3.1 ng/mL (ref 0.3–4.0)
Relative Index: INVALID (ref 0.0–2.5)
Total CK: 47 U/L (ref 7–177)
Troponin I: 0.34 ng/mL — ABNORMAL HIGH (ref 0.00–0.06)

## 2010-04-27 LAB — CBC
HCT: 30.5 % — ABNORMAL LOW (ref 36.0–46.0)
Hemoglobin: 9.4 g/dL — ABNORMAL LOW (ref 12.0–15.0)
MCHC: 31 g/dL (ref 30.0–36.0)
MCV: 87.1 fL (ref 78.0–100.0)
RDW: 20.1 % — ABNORMAL HIGH (ref 11.5–15.5)

## 2010-04-27 LAB — COMPREHENSIVE METABOLIC PANEL
AST: 27 U/L (ref 0–37)
BUN: 29 mg/dL — ABNORMAL HIGH (ref 6–23)
CO2: 21 mEq/L (ref 19–32)
Calcium: 10.1 mg/dL (ref 8.4–10.5)
Creatinine, Ser: 1.34 mg/dL — ABNORMAL HIGH (ref 0.4–1.2)
GFR calc Af Amer: 48 mL/min — ABNORMAL LOW (ref >60)
GFR calc non Af Amer: 40 mL/min — ABNORMAL LOW (ref >60)
Total Bilirubin: 0.8 mg/dL (ref 0.3–1.2)

## 2010-04-27 LAB — URINALYSIS, MICROSCOPIC ONLY
Leukocytes, UA: NEGATIVE
Nitrite: NEGATIVE
Protein, ur: >300 mg/dL — AB
Specific Gravity, Urine: 1.027 (ref 1.005–1.030)
Urobilinogen, UA: 0.2 mg/dL (ref 0.0–1.0)

## 2010-04-27 LAB — D-DIMER, QUANTITATIVE: D-Dimer, Quant: 1.18 ug/mL-FEU — ABNORMAL HIGH (ref 0.00–0.48)

## 2010-04-27 LAB — URINE CULTURE

## 2010-04-27 LAB — CULTURE, BLOOD (ROUTINE X 2)

## 2010-04-27 LAB — GLUCOSE, CAPILLARY: Glucose-Capillary: 356 mg/dL — ABNORMAL HIGH (ref 70–99)

## 2010-04-27 LAB — DIFFERENTIAL
Band Neutrophils: 0 % (ref 0–10)
Basophils Absolute: 0 10*3/uL (ref 0.0–0.1)
Basophils Relative: 0 % (ref 0–1)
Eosinophils Absolute: 0 10*3/uL (ref 0.0–0.7)
Myelocytes: 0 %
Promyelocytes Absolute: 0 %

## 2010-04-27 LAB — PATHOLOGIST SMEAR REVIEW

## 2010-05-04 LAB — GLUCOSE, CAPILLARY
Glucose-Capillary: 113 mg/dL — ABNORMAL HIGH (ref 70–99)
Glucose-Capillary: 118 mg/dL — ABNORMAL HIGH (ref 70–99)
Glucose-Capillary: 150 mg/dL — ABNORMAL HIGH (ref 70–99)
Glucose-Capillary: 255 mg/dL — ABNORMAL HIGH (ref 70–99)

## 2010-05-13 ENCOUNTER — Telehealth: Payer: Self-pay | Admitting: Family Medicine

## 2010-05-13 NOTE — Telephone Encounter (Signed)
Pt has a new glucose machine, aviva plus. Patient is requesting an rx for testing strips to be sent to [facility]

## 2010-05-19 ENCOUNTER — Ambulatory Visit (INDEPENDENT_AMBULATORY_CARE_PROVIDER_SITE_OTHER): Payer: Medicare Other | Admitting: Family Medicine

## 2010-05-19 ENCOUNTER — Encounter: Payer: Self-pay | Admitting: Family Medicine

## 2010-05-19 VITALS — BP 143/60 | HR 54 | Temp 98.3°F | Ht 66.0 in | Wt 174.0 lb

## 2010-05-19 DIAGNOSIS — I10 Essential (primary) hypertension: Secondary | ICD-10-CM

## 2010-05-19 DIAGNOSIS — E1165 Type 2 diabetes mellitus with hyperglycemia: Secondary | ICD-10-CM

## 2010-05-19 LAB — POCT GLYCOSYLATED HEMOGLOBIN (HGB A1C): Hemoglobin A1C: 6.4

## 2010-05-19 NOTE — Progress Notes (Signed)
  Subjective:    Patient ID: Christine Santos, female    DOB: [birth year], 65 y.o.   MRN: [id number]  HPI  Feels good.  BP and BS readings at home quite good.  No complaints.    Review of Systems     Objective:   Physical Exam Weight down 2 lbs.   Lungs clear Cardiac RRR without murmur Ext, no edema       Assessment & Plan:

## 2010-05-20 ENCOUNTER — Telehealth: Payer: Self-pay | Admitting: Family Medicine

## 2010-05-20 DIAGNOSIS — N182 Chronic kidney disease, stage 2 (mild): Secondary | ICD-10-CM

## 2010-05-20 LAB — BASIC METABOLIC PANEL WITH GFR
CO2: 21 mEq/L (ref 19–32)
Chloride: 107 mEq/L (ref 96–112)
Glucose, Bld: 171 mg/dL — ABNORMAL HIGH (ref 70–99)
Potassium: 4.9 mEq/L (ref 3.5–5.3)
Sodium: 140 mEq/L (ref 135–145)

## 2010-05-20 NOTE — Assessment & Plan Note (Signed)
Worsened creat without apparent cause

## 2010-05-20 NOTE — Telephone Encounter (Signed)
Called patient because of bump in creat.  No med changes between BMP or 04/14/10 and 05/19/10.  Switched from lasix to HCTZ and increased lisinopril in Feb but any changes should have been reflected on 04/14/10 BMP.  I could not come up with any reason for bump.  She will drink lots of fluids and recheck BMP in one week.

## 2010-05-20 NOTE — Assessment & Plan Note (Signed)
Good control, recheck BMP

## 2010-05-20 NOTE — Assessment & Plan Note (Signed)
Excellent control.  Congratulated.

## 2010-05-26 ENCOUNTER — Other Ambulatory Visit: Payer: Medicare Other

## 2010-05-26 DIAGNOSIS — N182 Chronic kidney disease, stage 2 (mild): Secondary | ICD-10-CM

## 2010-05-26 NOTE — Progress Notes (Signed)
Bmp done today Gerianne Simonet 

## 2010-05-27 LAB — BASIC METABOLIC PANEL WITH GFR
BUN: 55 mg/dL — ABNORMAL HIGH (ref 6–23)
CO2: 21 mEq/L (ref 19–32)
Chloride: 111 mEq/L (ref 96–112)
GFR, Est African American: 35 mL/min — ABNORMAL LOW (ref >60)
Glucose, Bld: 96 mg/dL (ref 70–99)
Potassium: 4.8 mEq/L (ref 3.5–5.3)
Sodium: 144 mEq/L (ref 135–145)

## 2010-05-27 NOTE — Progress Notes (Signed)
Called and LM.  Renal function returned to her baseline.  Continue to drink plenty of fluids - especially in this hot weather.

## 2010-06-07 ENCOUNTER — Other Ambulatory Visit: Payer: Self-pay | Admitting: Oncology

## 2010-06-07 ENCOUNTER — Encounter (HOSPITAL_BASED_OUTPATIENT_CLINIC_OR_DEPARTMENT_OTHER): Payer: Medicare Other | Admitting: Oncology

## 2010-06-07 DIAGNOSIS — N289 Disorder of kidney and ureter, unspecified: Secondary | ICD-10-CM

## 2010-06-07 DIAGNOSIS — C9110 Chronic lymphocytic leukemia of B-cell type not having achieved remission: Secondary | ICD-10-CM

## 2010-06-07 LAB — CBC WITH DIFFERENTIAL/PLATELET
BASO%: 0.2 % (ref 0.0–2.0)
Basophils Absolute: 0 10*3/uL (ref 0.0–0.1)
EOS%: 1.5 % (ref 0.0–7.0)
Eosinophils Absolute: 0.3 10*3/uL (ref 0.0–0.5)
HCT: 31 % — ABNORMAL LOW (ref 34.8–46.6)
HGB: 10.1 g/dL — ABNORMAL LOW (ref 11.6–15.9)
LYMPH%: 76.9 % — ABNORMAL HIGH (ref 14.0–49.7)
MCH: 30.8 pg (ref 25.1–34.0)
MCHC: 32.5 g/dL (ref 31.5–36.0)
MCV: 94.8 fL (ref 79.5–101.0)
MONO#: 0.9 10*3/uL (ref 0.1–0.9)
MONO%: 5 % (ref 0.0–14.0)
NEUT#: 2.9 10*3/uL (ref 1.5–6.5)
NEUT%: 16.4 % — ABNORMAL LOW (ref 38.4–76.8)
Platelets: 248 10*3/uL (ref 145–400)
RBC: 3.27 10*6/uL — ABNORMAL LOW (ref 3.70–5.45)
RDW: 16.7 % — ABNORMAL HIGH (ref 11.2–14.5)
WBC: 17.7 10*3/uL — ABNORMAL HIGH (ref 3.9–10.3)
lymph#: 13.6 10*3/uL — ABNORMAL HIGH (ref 0.9–3.3)

## 2010-06-07 LAB — COMPREHENSIVE METABOLIC PANEL WITH GFR
ALT: 13 U/L (ref 0–35)
AST: 19 U/L (ref 0–37)
Albumin: 4 g/dL (ref 3.5–5.2)
Alkaline Phosphatase: 79 U/L (ref 39–117)
BUN: 47 mg/dL — ABNORMAL HIGH (ref 6–23)
CO2: 21 meq/L (ref 19–32)
Calcium: 9.2 mg/dL (ref 8.4–10.5)
Chloride: 111 meq/L (ref 96–112)
Creatinine, Ser: 1.84 mg/dL — ABNORMAL HIGH (ref 0.40–1.20)
Glucose, Bld: 127 mg/dL — ABNORMAL HIGH (ref 70–99)
Potassium: 5.6 meq/L — ABNORMAL HIGH (ref 3.5–5.3)
Sodium: 141 meq/L (ref 135–145)
Total Bilirubin: 0.3 mg/dL (ref 0.3–1.2)
Total Protein: 5.9 g/dL — ABNORMAL LOW (ref 6.0–8.3)

## 2010-06-07 LAB — MORPHOLOGY: PLT EST: ADEQUATE

## 2010-06-07 LAB — CHCC SMEAR

## 2010-06-08 NOTE — Op Note (Signed)
Christine Santos, Christine Santos                 ACCOUNT NO.:  [id number]   MEDICAL RECORD NO.:  [id number]          PATIENT TYPE:  AMB   LOCATION:  SDS                          FACILITY:  [facility]   PHYSICIAN:  [name], MDDATE OF BIRTH:  1945-04-28   DATE OF PROCEDURE:  05/29/2008  DATE OF DISCHARGE:  05/29/2008                               OPERATIVE REPORT   PROCEDURES PERFORMED:  Percutaneous transluminal angioplasty of the  right superficial femoral artery, stent to the right superficial femoral  artery.   OPERATOR:  [name], MD   INDICATIONS:  Right leg claudication.   PROCEDURE NARRATIVE:  The diagnostic procedure had been performed a week  ago.  The sheath was not able to be advanced across the aortoiliac  bifurcation and therefore, the patient was brought back for an antegrade  stick.  She was brought to the [facility] lab and prepped and draped in the  usual sterile fashion.  Her right groin was infiltrated with 1%  lidocaine.  A 6-French sheath was placed antegrade down the right common  femoral artery into the right SFA using the modified Seldinger  technique.  Hand injection of contrast was performed.  A Wholey wire was  placed across the sequential lesions in the mid SFA.  The more proximal  lesion was 90%.  The more distal lesion was 80%.  A 4-mm x 2-cm FoxCross  balloon was inflated to 12 atmospheres for 45 seconds that apparently  missed the lesion.  The balloon was re-advanced and inflated to 10  atmospheres for 45 seconds.  There was still significant stenosis noted.  The balloon was again re-advanced and inflated to 14 atmospheres for 30  seconds in the distal lesion and then to 14 atmospheres for 45 seconds  in the more proximal lesion.  There was a visible dissection noted in  the more proximal lesion.  A 6-mm x 100 Absolute Pro stent was then  deployed across the area of disease in question and covered both lesions  successfully.  The stent was then  postdilated with a 5.0 x 80-mm  FoxCross balloon inflated to 9 atmospheres for 30 seconds both more  distally and proximally.  There was an excellent angiographic result.  There was an approximately 100% residual stenosis at the most heavily  calcified area.   IMPRESSION:  1. Significant right superficial femoral artery disease as noted      above.  2. Successful revascularization of the right superficial femoral      artery.   RECOMMENDATIONS:  Continue aspirin and Plavix for at least 30 days along  with aggressive secondary prevention.      [name], MD  Electronically Signed     JSV/MEDQ  D:  05/29/2008  T:  05/30/2008  Job:  [id number]

## 2010-06-08 NOTE — Op Note (Signed)
Christine Santos, Christine Santos                 ACCOUNT NO.:  [id number]   MEDICAL RECORD NO.:  [id number]          PATIENT TYPE:  AMB   LOCATION:  SDS                          FACILITY:  [facility]   PHYSICIAN:  [name], MDDATE OF BIRTH:  01/06/1946   DATE OF PROCEDURE:  05/22/2008  DATE OF DISCHARGE:  05/22/2008                               OPERATIVE REPORT   REFERRING PHYSICIANS:  1. Barnett Abu, MD  2. Nehemiah Settle, MD   PROCEDURES PERFORMED:  1. Abdominal aortogram.  2. Pelvic angiogram.  3. Bilateral lower extremity runoff.  4. Selective right external iliac angiogram.   OPERATOR:  [name], MD   INDICATIONS:  Right leg claudication.   PROCEDURE NARRATIVE:  The risks and benefits of peripheral vascular  angiography were explained to the patient and informed consent was  obtained.  She was brought to the cath lab.  She was prepped and draped  in the usual sterile fashion.  Her left groin was infiltrated with 1%  lidocaine.  A 5-French sheath was placed into the left femoral artery  using modified Seldinger technique.  A short pigtail catheter was used  to perform abdominal aortography and pelvic angiography using power  injection of contrast.  Bilateral lower extremity runoffs were performed  using a power injection of contrast in the same pigtail catheter.  A  crossover catheter was then used to place the wire in the right iliac  system.  A 5-French straight catheter was placed over that wire and  power injection of contrast was performed after the catheter was in the  right external iliac artery to image the right SFA.  An attempt was made  to get a 7-French Terumo sheath over the iliac bifurcation; however,  despite using a Rosen wire and an Amplatz superstiff wire, there was not  enough support to get the sheath over the iliac bifurcation.  Repeat  abdominal angiography was repeated to make sure there was no trauma to  the iliac systems bilaterally or the  distal aorta.  The procedure was  stopped at that point.  The sheath will be removed using manual  compression.   FINDINGS:  The abdominal aortogram showed no abdominal aortic aneurysm.  There are bilateral single renal arteries, which are widely patent.  The  aortoiliac bifurcation is calcified, but widely patent.  The common and  external iliac arteries are widely patent bilaterally.  The internal  iliac arteries are patent bilaterally.  Both common femoral arteries are  widely patent bilaterally.  In the right leg, there is a focal 90% stenosis in the mid right SFA.  There is collateral filling from the patent right profunda vessel.  The  popliteal artery on the right is widely patent.  There is three-vessel  runoff below the knee, which appears widely patent.  In the left leg, the left SFA has mild diffuse atherosclerosis, the left  popliteal is widely patent.  There appears to be three-vessel runoff  below the left leg.   IMPRESSION:  1. Significant right superficial femoral artery stenosis with  collateral.  2. Widely patent vasculature in the left leg.  3. No abdominal aortic aneurysm.  4. No renal artery stenosis.   RECOMMENDATIONS:  Given that we could not go over the iliac bifurcation,  we will bring the patient back for an antegrade stick down the right leg  to try to revascularize the right SFA.  I did not attempt that today  because the patient had already been given over 100 mL of contrast and  had heparin administered after the 7-French sheath was placed.  Continue  aggressive secondary prevention.      [name], MD  Electronically Signed     JSV/MEDQ  D:  05/22/2008  T:  05/22/2008  Job:  [id number]

## 2010-06-11 NOTE — Consult Note (Signed)
[facility]  Patient:    Christine Santos, Christine Santos                        MRN: [id number] Proc. Date: 03/15/00 Adm. Date:  [id number] Attending:  Tiburcio Pea Dictator:   Cleopatra Cedar, M.D.                          Consultation Report  CHIEF COMPLAINT:  Chest pain, rule out myocardial infarction.  Basically comes in with a history of chest pain this morning.  She went to work and had sudden onset of chest pain while sitting at work.  It was described by her as substernal in location, felt like gas.  She did have some burping associated with that which actually relieved the chest pain a little bit.  She then proceeded to eat a cheese sandwich which made the chest pain worse.  She then regurgitated and actually vomited nondigested food particles and that also relieved the chest pain, but it did not go away altogether.  She went to the medical nurse at work.  He took her blood pressure and found it to be elevated.  She does have a past medical history significant for atherosclerotic cardiovascular disease.  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 DD:  03/15/00 TD:  03/16/00 Job: [id number]

## 2010-06-11 NOTE — Discharge Summary (Signed)
[place]. [facility]  Patient:    Christine Santos, Christine Santos                        MRN: [id number] Adm. Date:  [id number] Disc. Date: [id number] Attending:  Tiburcio Pea Dictator:   Jonah Blue, M.D. CC:         Jamal Collin. Andria Frames, M.D.  Barnett Abu, M.D.   Discharge Summary  DISCHARGE DIAGNOSES: 1. Noncardiac chest pain, ruled out for myocardial infarction. 2. Chronic lymphocytic leukemia. 3. Esophageal stricture. 4. Diabetes mellitus, type 2. 5. Hypertension. 6. Hypercholesterolemia.  DISCHARGE MEDICATIONS:  1. Aspirin 325 mg p.o. q.d.  2. Darvocet-N 100 p.o. q.6h. p.r.n. pain.  3. Flexeril 10 mg p.o. q.h.s.  4. Glucophage 1000 mg p.o. b.i.d.  5. Glyburide 10 mg p.o. b.i.d.  6. Lipitor 40 mg p.o. q.d.  7. Paxil 20 mg p.o. q.d.  8. Premarin 0.625 mg p.o. q.d.  9. Provera 2.5 mg p.o. q.d. 10. Tri-Chlor as prescribed by Barnett Abu, M.D. 11. Vitamin C 500 mg p.o. q.d. 12. Vitamin E 400 iu p.o. q.d. 13. Zestril 20 mg p.o. q.d. 14. Welchol four tablets p.o. q.a.m. 15. A multivitamin p.o. q.d.  PROCEDURES:  Cardiac catheterization was performed on March 16, 2000. There was no significant obstruction.  ADMISSION HISTORY AND PHYSICAL:  This 65 year old Caucasian female with known coronary artery disease and chronic lymphocytic leukemia presented to the [place] [place] Emergency Department complaining of excruciating substernal chest pain.  Her pain began at 7:30 a.m. while drinking coffee, improved after burping, and began worse with food intake. She was diaphoretic, short of breath, and vomiting.  In the emergency department, she received two sublingual nitroglycerin without improvement. The pain was unaffected by exertion, was nonradiating, and accompanied by chest pressure, 3/10, and worse with deep inspiration.  She had a prior MI in 1996 which also felt like indigestion.  She has not had any chest pain  since that time.  She denied recent fever or chills.  Denies weight change.  No dyspnea on exertion, lower extremity edema, cough, upper respiratory symptoms, or bleeding from the rectum.  She did not chronic diarrhea, but her skin felt clammy and flushed.  The remainder of the review of systems was negative.  Her physical exam was significant for chest wall tenderness to palpation, which reproduced her pain and otherwise was unremarkable.  ADMISSION LABORATORY DATA:  A BMET was unremarkable, except for a potassium of 5.8.  White blood cell count 36.7, hemoglobin 10.2, platelets 467.  The first set of cardiac enzymes was unremarkable.  The EKG showed normal sinus rhythm with no acute ST-T wave changes.  Chest x-ray showed no acute disease.  A chest CT with contrast showed no acute disease and no pulmonary embolism.  HOSPITAL COURSE: #1 - CHEST PAIN:  While atypical in nature, her pain was consistent with that of an earlier MI and she has multiple cardiac risk factors.  She was admitted and started on a nitroglycerin drip titrated to pain.  Barnett Abu, M.D., was consulted to see her.  The morning following admission, she underwent cardiac catheterization, which showed no clinically significant obstruction. The remainder of her cardiac enzymes were normal with the exception of a borderline troponin I of 0.05.  She did not have any further episodes of chest pain while in the hospital and tolerated the catheterization with no complications.  #2 - CHRONIC LYMPHOCYTIC LEUKEMIA:  This has been stable, according to the patient.  She is followed by Virgie Dad. Magrinat, M.D., and the remainder of her work-up will be performed as an outpatient.  #3 - HYPERTENSION:  Ms. Recktenwald was started on a low-dose beta blocker, metoprolol 50 mg p.o. b.i.d. and she will be discharged on that same dose. She remained normotensive throughout the hospitalization.  #4 - DIABETES:  The most recent hemoglobin  A1C was 6.8.  Glucophage was held during the hospitalization.  The patient understands that she may not resume the use of Glucophage for a least 48 hours after receiving radiographic contrast.  She will resume the use of Glucophage following her appointment with her primary M.D., Jamal Collin. Andria Frames, M.D.  #5 - HYPERLIPIDEMIA:  Home doses of Tricor and Welchol were continued while in the hospital.  #6 - GASTROESOPHAGEAL REFLUX DISEASE WITH ESOPHAGEAL STRICTURE:  Protonix p.o. was started and the patient will be discharged home on 40 mg q.d.  She will likely require further GI work-up which can be performed as an outpatient and primary M.D., Jamal Collin. Hensel, M.D., will schedule further work-up with her gastroenterologist, Jeneen Rinks L. Oletta Lamas, M.D., as required.  Follow-up is scheduled at [facility] with Jamal Collin. Andria Frames, M.D., on Friday, March 24, 2000, at 2:30 p.m. and with her cardiologist, Barnett Abu, M.D. DD:  03/16/00 TD:  03/18/00 Job: [id number]

## 2010-06-11 NOTE — Cardiovascular Report (Signed)
[place]. [facility]  Patient:    Christine Santos, Christine Santos                        MRN: [id number] Proc. Date: 03/16/00 Adm. Date:  [id number] Disc. Date: [id number] Attending:  Tiburcio Pea CC:         Jeb Levering. Erin Hearing, M.D.             Cardiac Catheterization Lab                        Cardiac Catheterization  PROCEDURES PERFORMED. 1. Left heart catheterization. 2. Coronary angiography. 3. Saphenous vein graft angiography. 4. Arterial conduit angiography. 5. Left ventriculogram. 6. Percutaneous closure of right femoral artery.  SURGEON:  Barnett Abu, M.D.  INDICATIONS:  Christine Santos is a 65 year old woman who was admitted yesterday with atypical angina.  A single troponin has been very minimally elevated at 0.05, and an ECG has shown extension of T wave inversion from I and aVL to V5 and V6 overnight.  She remains mildly symptomatic this morning, has not had much relief from IV nitroglycerin or GI cocktail.  She is brought now to the catheterization laboratory to identify possible progressive coronary disease or graft atherosclerosis as well as provide for further therapeutic options.  PROCEDURE NOTE:  Left heart catheterization was performed following percutaneous insertion of a 6-French catheter sheath utilizing an anterior approach over guiding J wire into the right femoral artery.  A 110 cm pigtail catheter was used to measure pressures in the ascending aorta and ascending aorta both prior to and following the ventriculogram.   A 30 degree RAO cine left ventriculogram was performed utilizing the power injector.  Left and right coronary angiography was performed using 6-French #4 left and right Judkins catheters.  Cine angiography of each coronary was conducted in multiple RAO and LAO projections.  The right Judkins catheter was also used to perform cine angiography of the saphenous vein graft to the posterior descending artery, the obtuse  marginal, and the LIMA to the LAD.  At the completion of the procedure, the catheter and catheter sheaths were removed.  Hemostasis was achieved by use of the percutaneous closure system, Perclose.  There was good hemostasis.  The patient was transported to the recovery area in stable condition with intact distal pulses.  HEMODYNAMICS:  Systolic arterial pressure was 113/55 with a mean of 79 mmHg. There was no systolic gradient across the aortic valve.  The left ventricular end diastolic pressure was 6 mmHg pre-ventriculogram and unchanged post ventriculogram.  ANGIOGRAPHY:  The left ventriculogram demonstrated intact global left ventricular diastolic and systolic function.  There was mild anterolateral hypokinesis.  There was no significant mitral regurgitation.  Two graft markers were seen in the ascending aorta.  No coronary calcification.  The calculated ejection fraction utilizing a single plane cine method was 63%.  There was a right dominant coronary system present.  The main left coronary artery was long and diffusely diseased but without significant obstruction.  The left anterior descending artery and its branches were highly diseased; this vessel was completely occluded after the origin of a large first septal perforator.  It was later visualized after injection of the LIMA graft.  The left circumflex coronary artery and its branches were highly diseased; this vessel is completely occluded at its origin.  The right coronary artery and its branches were highly diseased; this vessel has luminal  irregularities and 20 and 30% stenoses in the mid and proximal segment.  It fills a small to moderate size posterolateral segment and branches without significant obstruction.  There is an 80% stenosis at the origin of the posterior descending artery, and competitive flow is seen within the posterior descending artery itself.  SAPHENOUS VEIN GRAFT ANGIOGRAPHY:  The saphenous  vein graft to the right coronary artery was widely patent and inserts upon the mid portion of the posterior descending artery.  It fills the artery retrograde and antegrade into the distal right coronary.  No significant obstructions are seen within this conduit.  This includes the origin and the anastomosis.  The saphenous vein graft to the marginal branch is widely patent; it inserts upon the proximal portion of the marginal branch.  The marginal branch trifurcates on the posterolateral distal aspect of the left ventricle.  There are no obstructions seen within this conduit.  The LIMA graft to the LAD is widely patent and is a large vessel, highly tortuous, but without significant obstruction.  It inserts upon the mid and distal portion of the left anterior descending artery and provides retrograde and antegrade flow including a small to moderate proximal diagonal branch.  Collateral vessels were not seen.  FINAL IMPRESSION: 1. Atherosclerotic cardiovascular disease, three vessel. 2. Intact left ventricular size and global systolic function, ejection    fraction 63%. 3. Widely patent saphenous vein grafts and arterial conduits. 4. Noncardiac chest pain.  PLAN/RECOMMENDATIONS:  Will discontinue nitroglycerin and heparin.  We will recommend GI evaluation.  The patient has a previous history of esophageal stricture.  Would also begin IV Toradol for 24 to 36 hours for treatment of her chest pain pain. DD:  03/16/00 TD:  03/17/00 Job: [id number]

## 2010-06-15 ENCOUNTER — Other Ambulatory Visit: Payer: Self-pay | Admitting: Family Medicine

## 2010-06-15 ENCOUNTER — Telehealth: Payer: Self-pay | Admitting: Family Medicine

## 2010-06-15 NOTE — Telephone Encounter (Signed)
Pt was d/c from oxygen last month, needs orders sent to [facility] so pt can return the oxygen tanks.

## 2010-06-15 NOTE — Telephone Encounter (Signed)
Refill request

## 2010-06-16 NOTE — Telephone Encounter (Signed)
Handwritten order given

## 2010-06-16 NOTE — Telephone Encounter (Signed)
Spoke with [facility] and they will need a written order to return O2 tanks. If you would please write this I will fax it to them ([phone]).  Thanks,  Kenney Houseman

## 2010-08-02 ENCOUNTER — Other Ambulatory Visit: Payer: Self-pay | Admitting: Family Medicine

## 2010-08-02 NOTE — Telephone Encounter (Signed)
Refill request

## 2010-08-02 NOTE — Telephone Encounter (Signed)
Pt calling re: this medication, says she needs the dosage to be 27 mgs.

## 2010-08-11 ENCOUNTER — Other Ambulatory Visit: Payer: Self-pay | Admitting: Oncology

## 2010-08-11 ENCOUNTER — Encounter (HOSPITAL_BASED_OUTPATIENT_CLINIC_OR_DEPARTMENT_OTHER): Payer: Medicare Other | Admitting: Oncology

## 2010-08-11 DIAGNOSIS — N289 Disorder of kidney and ureter, unspecified: Secondary | ICD-10-CM

## 2010-08-11 LAB — COMPREHENSIVE METABOLIC PANEL
AST: 25 U/L (ref 0–37)
Alkaline Phosphatase: 81 U/L (ref 39–117)
BUN: 41 mg/dL — ABNORMAL HIGH (ref 6–23)
Calcium: 9.2 mg/dL (ref 8.4–10.5)
Creatinine, Ser: 1.89 mg/dL — ABNORMAL HIGH (ref 0.50–1.10)
Total Bilirubin: 0.3 mg/dL (ref 0.3–1.2)

## 2010-08-11 LAB — CBC WITH DIFFERENTIAL/PLATELET
Basophils Absolute: 0.1 10*3/uL (ref 0.0–0.1)
EOS%: 0.8 % (ref 0.0–7.0)
HCT: 32.6 % — ABNORMAL LOW (ref 34.8–46.6)
HGB: 10.9 g/dL — ABNORMAL LOW (ref 11.6–15.9)
LYMPH%: 76.5 % — ABNORMAL HIGH (ref 14.0–49.7)
MCH: 31.7 pg (ref 25.1–34.0)
MCHC: 33.4 g/dL (ref 31.5–36.0)
MCV: 94.9 fL (ref 79.5–101.0)
MONO%: 4.5 % (ref 0.0–14.0)
NEUT%: 17.9 % — ABNORMAL LOW (ref 38.4–76.8)

## 2010-08-11 LAB — LACTATE DEHYDROGENASE: LDH: 201 U/L (ref 94–250)

## 2010-08-12 ENCOUNTER — Telehealth: Payer: Self-pay | Admitting: Family Medicine

## 2010-08-12 DIAGNOSIS — E875 Hyperkalemia: Secondary | ICD-10-CM

## 2010-08-12 DIAGNOSIS — I10 Essential (primary) hypertension: Secondary | ICD-10-CM

## 2010-08-12 MED ORDER — LISINOPRIL 40 MG PO TABS: 20.0000 mg | ORAL_TABLET | Freq: Every day | ORAL | Status: DC

## 2010-08-12 NOTE — Telephone Encounter (Signed)
Received note from oncologist saying K+ is high - a chronic problem for her.  Told patient to see me (has appointment in two weeks.)  Starting today, decrease lisinopril to 1/2 tab daily.  Drink lots of fluids.  Check BMP and BP in five days in my office.

## 2010-08-12 NOTE — Assessment & Plan Note (Signed)
K+ on 7/18=6.1

## 2010-08-13 ENCOUNTER — Ambulatory Visit: Payer: Medicare Other

## 2010-08-17 ENCOUNTER — Other Ambulatory Visit: Payer: Medicare Other

## 2010-08-17 ENCOUNTER — Telehealth: Payer: Self-pay | Admitting: Family Medicine

## 2010-08-17 VITALS — BP 180/50 | HR 50

## 2010-08-17 DIAGNOSIS — E875 Hyperkalemia: Secondary | ICD-10-CM

## 2010-08-17 NOTE — Progress Notes (Signed)
Patient in for labs and ask for BP to be taken. BP checked manually using regular adult cuff. BP RA 184/66 and RA 180/50 pulse 50.  patient states she has decreased lisinopril  To 1/2 tab daily  as directed by Dr. Andria Frames last week. Consulted with Dr. Andria Frames and he advises for patient to come in tomorrow afternoon for follow up.

## 2010-08-17 NOTE — Telephone Encounter (Signed)
Patient dropped off a paper requesting medical clearance for cataract surgery.  It is scheduled for 09/21/10. Placed paper in your box.

## 2010-08-17 NOTE — Telephone Encounter (Signed)
Form completed and placed in "to be faxed" pile

## 2010-08-17 NOTE — Progress Notes (Signed)
BMP DONE TODAY Christine Santos 

## 2010-08-18 ENCOUNTER — Ambulatory Visit (INDEPENDENT_AMBULATORY_CARE_PROVIDER_SITE_OTHER): Payer: Medicare Other | Admitting: Family Medicine

## 2010-08-18 ENCOUNTER — Encounter: Payer: Self-pay | Admitting: Family Medicine

## 2010-08-18 DIAGNOSIS — E1165 Type 2 diabetes mellitus with hyperglycemia: Secondary | ICD-10-CM

## 2010-08-18 DIAGNOSIS — E875 Hyperkalemia: Secondary | ICD-10-CM

## 2010-08-18 DIAGNOSIS — I2510 Atherosclerotic heart disease of native coronary artery without angina pectoris: Secondary | ICD-10-CM

## 2010-08-18 DIAGNOSIS — I10 Essential (primary) hypertension: Secondary | ICD-10-CM

## 2010-08-18 DIAGNOSIS — F419 Anxiety disorder, unspecified: Secondary | ICD-10-CM

## 2010-08-18 DIAGNOSIS — E118 Type 2 diabetes mellitus with unspecified complications: Secondary | ICD-10-CM

## 2010-08-18 DIAGNOSIS — F411 Generalized anxiety disorder: Secondary | ICD-10-CM

## 2010-08-18 LAB — BASIC METABOLIC PANEL WITH GFR
Calcium: 9 mg/dL (ref 8.4–10.5)
GFR, Est African American: 36 mL/min — ABNORMAL LOW (ref >60)
Sodium: 142 mEq/L (ref 135–145)

## 2010-08-18 MED ORDER — FELODIPINE ER 10 MG PO TB24: 10.0000 mg | ORAL_TABLET | Freq: Every day | ORAL | Status: DC

## 2010-08-18 MED ORDER — CLOPIDOGREL BISULFATE 75 MG PO TABS
75.0000 mg | ORAL_TABLET | Freq: Every day | ORAL | Status: DC
Start: 2010-08-18 — End: 2011-08-03

## 2010-08-18 MED ORDER — PAROXETINE HCL 20 MG PO TABS: 20.0000 mg | ORAL_TABLET | ORAL | Status: DC

## 2010-08-18 MED ORDER — INSULIN GLARGINE 100 UNIT/ML ~~LOC~~ SOLN: 27.0000 [IU] | Freq: Every day | SUBCUTANEOUS | Status: DC

## 2010-08-18 NOTE — Assessment & Plan Note (Signed)
Stable on 27 units daily

## 2010-08-18 NOTE — Assessment & Plan Note (Signed)
-

## 2010-08-18 NOTE — Patient Instructions (Signed)
For two weeks, stay on lower dose of lisinopril No coffee or tea or chocolate After 2 weeks increase back to a full lisinopril After 1 week on the higher dose of lisinopril, come back for blood work to check your potassium

## 2010-08-23 ENCOUNTER — Telehealth: Payer: Self-pay | Admitting: Family Medicine

## 2010-08-23 NOTE — Telephone Encounter (Signed)
Ms. Sastry wanted Dr. Andria Frames to know that the referral was received.  He does not need to send another.

## 2010-08-23 NOTE — Telephone Encounter (Signed)
Christine Santos is calling to check on the status of the paperwork for her cataract surgery.  It was dropped of on 7/24, but the surgical center has not received it yet and she would like to speak with someone about that.

## 2010-08-23 NOTE — Progress Notes (Signed)
  Subjective:    Patient ID: Christine Santos, female    DOB: [birth year], 65 y.o.   MRN: [id number]  HPI  Being seen for hyperkalemia noted by oncologist on routine blood work.  She has had hyperkalemia in the past.  We googled high potassium foods and were both surprised that coffee contains a high amount of potassium.  She can do a little better on her diet.  Over the phone, I had cut her ACE dose in half and encouraged fluid intake.  Repeat blood work shows potassium has normalized.  BP is up today.  No other complaints.    Review of Systems     Objective:   Physical Exam Gen normal Cardiac RRR without m or g Lungs clear Ext no edema       Assessment & Plan:   CORONARY, ARTERIOSCLEROSIS Stable, no chest pain  DIABETES MELLITUS, II, COMPLICATIONS Stable on 27 units daily

## 2010-08-23 NOTE — Telephone Encounter (Signed)
Called and told that I had already completed, faxed and that I no longer have a copy.  She will recheck with surgical center and bring another copy of the forms if they do not have.

## 2010-08-23 NOTE — Assessment & Plan Note (Addendum)
Observe for now on lower dose of ACE.  Given that we have identified source of high potassium, will wait 2 weeks and try to once again increase dose of ACE.  ACE needed for CKD, DM and CAD

## 2010-08-23 NOTE — Assessment & Plan Note (Signed)
Resolved on lower dose of ACE

## 2010-08-25 ENCOUNTER — Ambulatory Visit: Payer: Medicare Other | Admitting: Family Medicine

## 2010-08-27 ENCOUNTER — Ambulatory Visit: Payer: Medicare Other | Admitting: Family Medicine

## 2010-09-08 ENCOUNTER — Other Ambulatory Visit: Payer: Medicare Other

## 2010-09-08 DIAGNOSIS — E875 Hyperkalemia: Secondary | ICD-10-CM

## 2010-09-08 NOTE — Progress Notes (Signed)
Bmp done today Kesha Hurrell 

## 2010-09-09 LAB — BASIC METABOLIC PANEL WITH GFR
GFR, Est African American: 38 mL/min — ABNORMAL LOW (ref >60)
GFR, Est Non African American: 32 mL/min — ABNORMAL LOW (ref >60)
Potassium: 4.6 mEq/L (ref 3.5–5.3)
Sodium: 143 mEq/L (ref 135–145)

## 2010-09-17 ENCOUNTER — Other Ambulatory Visit: Payer: Self-pay | Admitting: Family Medicine

## 2010-09-17 NOTE — Telephone Encounter (Signed)
Refill request

## 2010-09-23 ENCOUNTER — Ambulatory Visit (HOSPITAL_COMMUNITY)
Admission: RE | Admit: 2010-09-23 | Discharge: 2010-09-23 | Disposition: A | Discharge status: Home / Self Care | Payer: Medicare Other | Source: Ambulatory Visit | Attending: Interventional Cardiology | Admitting: Interventional Cardiology

## 2010-09-23 DIAGNOSIS — I2510 Atherosclerotic heart disease of native coronary artery without angina pectoris: Secondary | ICD-10-CM | POA: Insufficient documentation

## 2010-09-23 DIAGNOSIS — Z538 Procedure and treatment not carried out for other reasons: Secondary | ICD-10-CM | POA: Insufficient documentation

## 2010-09-23 LAB — POCT I-STAT, CHEM 8
Creatinine, Ser: 2 mg/dL — ABNORMAL HIGH (ref 0.50–1.10)
Glucose, Bld: 169 mg/dL — ABNORMAL HIGH (ref 70–99)
Hemoglobin: 10.5 g/dL — ABNORMAL LOW (ref 12.0–15.0)
Potassium: 4.8 mEq/L (ref 3.5–5.1)

## 2010-09-25 HISTORY — PX: ANGIOPLASTY / STENTING FEMORAL: SUR30

## 2010-10-04 ENCOUNTER — Telehealth: Payer: Self-pay | Admitting: Family Medicine

## 2010-10-04 NOTE — Telephone Encounter (Signed)
Was scheduled for cardiac cath - postponed due to creat=2.00.  Explained that renal dysfunction is not a new problem and that her creat has fluctuated from a low of 1.6 to a high of 2.4 over past couple of years.  Cards has plans to FU with repeat cath.  Patient knows that she needs to drink plenty of fluids.  She will keep me informed.

## 2010-10-04 NOTE — Telephone Encounter (Signed)
Christine Santos is calling to speak with Dr. Andria Frames.  She isn't sure if she needs to make another appt for labs and she has some other things to discuss with him.

## 2010-10-06 ENCOUNTER — Telehealth: Payer: Self-pay | Admitting: Family Medicine

## 2010-10-06 DIAGNOSIS — I739 Peripheral vascular disease, unspecified: Secondary | ICD-10-CM

## 2010-10-06 NOTE — Telephone Encounter (Signed)
Form placed in MD box for completion 

## 2010-10-06 NOTE — Telephone Encounter (Signed)
Patient dropped off form to be filled out for her diabetic shoes.  Please fax to [phone] when completed.

## 2010-10-07 NOTE — Assessment & Plan Note (Signed)
Filled out form for diabetic shoes - qualifies because of peripheral vascular disease.

## 2010-10-07 NOTE — Telephone Encounter (Signed)
Patient notified to pick up form

## 2010-10-14 ENCOUNTER — Ambulatory Visit (HOSPITAL_COMMUNITY)
Admission: RE | Admit: 2010-10-14 | Discharge: 2010-10-14 | Disposition: A | Discharge status: Home / Self Care | Payer: Medicare Other | Source: Ambulatory Visit | Attending: Interventional Cardiology | Admitting: Interventional Cardiology

## 2010-10-14 DIAGNOSIS — Y831 Surgical operation with implant of artificial internal device as the cause of abnormal reaction of the patient, or of later complication, without mention of misadventure at the time of the procedure: Secondary | ICD-10-CM | POA: Insufficient documentation

## 2010-10-14 DIAGNOSIS — T82898A Other specified complication of vascular prosthetic devices, implants and grafts, initial encounter: Secondary | ICD-10-CM | POA: Insufficient documentation

## 2010-10-14 DIAGNOSIS — I70219 Atherosclerosis of native arteries of extremities with intermittent claudication, unspecified extremity: Secondary | ICD-10-CM | POA: Insufficient documentation

## 2010-10-14 LAB — POCT ACTIVATED CLOTTING TIME
Activated Clotting Time: 182 seconds
Activated Clotting Time: 160 seconds

## 2010-10-18 ENCOUNTER — Encounter (HOSPITAL_BASED_OUTPATIENT_CLINIC_OR_DEPARTMENT_OTHER): Payer: Medicare Other | Admitting: Oncology

## 2010-10-18 ENCOUNTER — Other Ambulatory Visit: Payer: Self-pay | Admitting: Oncology

## 2010-10-18 DIAGNOSIS — E119 Type 2 diabetes mellitus without complications: Secondary | ICD-10-CM

## 2010-10-18 DIAGNOSIS — E785 Hyperlipidemia, unspecified: Secondary | ICD-10-CM

## 2010-10-18 DIAGNOSIS — I10 Essential (primary) hypertension: Secondary | ICD-10-CM

## 2010-10-18 DIAGNOSIS — C9110 Chronic lymphocytic leukemia of B-cell type not having achieved remission: Secondary | ICD-10-CM

## 2010-10-18 LAB — POCT I-STAT 7, (LYTES, BLD GAS, ICA,H+H)
Calcium, Ion: 1.24 mmol/L (ref 1.12–1.32)
O2 Saturation: 90 %
Potassium: 4.5 mEq/L (ref 3.5–5.1)
Sodium: 141 mEq/L (ref 135–145)
TCO2: 24 mmol/L (ref 0–100)
pCO2 arterial: 38.8 mmHg (ref 35.0–45.0)

## 2010-10-18 LAB — CBC WITH DIFFERENTIAL/PLATELET
Basophils Absolute: 0.1 10*3/uL (ref 0.0–0.1)
Eosinophils Absolute: 0.3 10*3/uL (ref 0.0–0.5)
HCT: 31.1 % — ABNORMAL LOW (ref 34.8–46.6)
HGB: 10.4 g/dL — ABNORMAL LOW (ref 11.6–15.9)
LYMPH%: 68.6 % — ABNORMAL HIGH (ref 14.0–49.7)
MCV: 94.4 fL (ref 79.5–101.0)
MONO#: 1.4 10*3/uL — ABNORMAL HIGH (ref 0.1–0.9)
MONO%: 5.8 % (ref 0.0–14.0)
NEUT#: 5.7 10*3/uL (ref 1.5–6.5)
NEUT%: 23.9 % — ABNORMAL LOW (ref 38.4–76.8)
Platelets: 260 10*3/uL (ref 145–400)
WBC: 23.8 10*3/uL — ABNORMAL HIGH (ref 3.9–10.3)

## 2010-10-18 LAB — COMPREHENSIVE METABOLIC PANEL
BUN: 38 mg/dL — ABNORMAL HIGH (ref 6–23)
CO2: 20 mEq/L (ref 19–32)
Creatinine, Ser: 2.23 mg/dL — ABNORMAL HIGH (ref 0.50–1.10)
Glucose, Bld: 158 mg/dL — ABNORMAL HIGH (ref 70–99)
Sodium: 140 mEq/L (ref 135–145)
Total Bilirubin: 0.4 mg/dL (ref 0.3–1.2)
Total Protein: 6.5 g/dL (ref 6.0–8.3)

## 2010-10-18 LAB — LACTATE DEHYDROGENASE: LDH: 208 U/L (ref 94–250)

## 2010-10-26 NOTE — Procedures (Signed)
Christine Santos, Christine Santos NO.:  [id number]  MEDICAL RECORD NO.:  [id number]  LOCATION:  [facility]                         FACILITY:  [facility]  PHYSICIAN:  [name], MDDATE OF BIRTH:  Feb 07, 1945  DATE OF PROCEDURE:  10/14/2010 DATE OF DISCHARGE:                   PERIPHERAL VASCULAR INVASIVE PROCEDURE   PRIMARY CARE PHYSICIAN:  Gwyndolyn Saxon A. Hensel, MD  PROCEDURE PERFORMED:  Pelvic angiogram of left lower extremity runoff, right lower extremity runoff, PTA of the right SFA.  OPERATOR:  [name], MD  INDICATIONS:  Claudication.  PROCEDURE NARRATIVE:  The risks and benefits of PV angiography were explained to the patient.  Informed consent was obtained.  She was brought to the [facility].  She was prepped and draped in usual sterile fashion.  Her left groin was infiltrated with 1% lidocaine.  A 5-French sheath was placed into the left common femoral artery using modifiedSeldinger technique.  Pigtail catheter was advanced to the aortoiliac bifurcation.  Power injection contrast was performed to image the pelvic vasculature.  A left lower extremity runoff was then performed using a 5- French sheath in a bolus chase fashion.  A crossover catheter was then advanced to the aortoiliac bifurcation to obtain access to the right iliac system and end-hole catheter was advanced to the right external iliac over a Wholey wire.  A selective left lower extremity angiogram was performed.  The intervention was then performed after this.  Please see below for details.  FINDINGS:  The aortoiliac bifurcation is widely patent.  The common iliac arteries, external iliac arteries and internal iliac arteries are patent bilaterally.  There is mild to moderate left common femoral artery disease.  The left leg, the left SFA shows mild calcific disease in the proximal SFA.  There is a focal severe calcific density at the left distal SFA adductor canal area.  The left popliteal  artery is widely patent.  There is patent three-vessel runoff.  Right lower extremity, the right common femoral artery is patent.  There is moderate proximal calcific disease.  There is a 95% stenosis at the proximal stent edge.  There is diffuse 40-50% in-stent restenosis of the mid to distal stent.  The right popliteal artery is widely patent.  The right anterior tibial artery is patent.  The right tibial peroneal trunk is patent.  The right posterior tibial artery is occluded but reconstitutes at the ankle.  PTA NARRATIVE:  A 6-French 45 cm Ansel sheath was advanced over the aortoiliac bifurcation over an Amplatz super stiff wire to the right external iliac artery.  A Wholey wire was used to cross the diseased area of 4.0 x 120 mm Mustang balloon was inflated to 20 atmospheres across the entire stent and across the entire stented area for 34 seconds again at 20 atmospheres.  There is an improved result.  There is a 4.0 x 120 Fox cross balloon was then inflated, this could be dilated to a bigger diameter, we therefore went to 18 atmospheres which took the balloon to 4.5 millimeters in diameter.  This resulted in no residual stenosis.  The tibial vessels appear widely patent.  IMPRESSION: 1. Significant bilateral superficial femoral artery disease. 2. Successful percutaneous transluminal angioplasty  to the right     superficial femoral artery up to 4.5 mm in diameter. 3. Significant calcific density in the left distal superficial femoral     artery.  RECOMMENDATIONS:  Continue aspirin and Plavix.  We will plan intervention of the left SFA at a later time likely from the right groin.  The patient will be aggressively hydrated for the next 10 hours. We will check renal function next week.     [name], MD     JSV/MEDQ  D:  10/14/2010  T:  10/14/2010  Job:  [id number]  Electronically Signed by Larae Grooms MD on 10/26/2010 01:11:46 PM

## 2010-11-07 ENCOUNTER — Encounter: Payer: Self-pay | Admitting: Oncology

## 2010-11-10 ENCOUNTER — Ambulatory Visit (INDEPENDENT_AMBULATORY_CARE_PROVIDER_SITE_OTHER): Payer: Medicare Other

## 2010-11-10 DIAGNOSIS — Z23 Encounter for immunization: Secondary | ICD-10-CM

## 2010-11-11 ENCOUNTER — Telehealth: Payer: Self-pay | Admitting: Family Medicine

## 2010-11-11 ENCOUNTER — Ambulatory Visit (INDEPENDENT_AMBULATORY_CARE_PROVIDER_SITE_OTHER): Payer: Medicare Other | Admitting: Family Medicine

## 2010-11-11 ENCOUNTER — Encounter: Payer: Self-pay | Admitting: Family Medicine

## 2010-11-11 VITALS — BP 148/64 | HR 65 | Temp 98.1°F | Ht 66.0 in | Wt 176.0 lb

## 2010-11-11 DIAGNOSIS — N950 Postmenopausal bleeding: Secondary | ICD-10-CM

## 2010-11-11 DIAGNOSIS — R300 Dysuria: Secondary | ICD-10-CM | POA: Insufficient documentation

## 2010-11-11 DIAGNOSIS — N393 Stress incontinence (female) (male): Secondary | ICD-10-CM

## 2010-11-11 HISTORY — DX: Stress incontinence (female) (male): N39.3

## 2010-11-11 LAB — POCT URINALYSIS DIPSTICK
Bilirubin, UA: NEGATIVE
Nitrite, UA: NEGATIVE
pH, UA: 5.5

## 2010-11-11 LAB — POCT WET PREP (WET MOUNT)
Trichomonas Wet Prep HPF POC: NEGATIVE
WBC, Wet Prep HPF POC: >20

## 2010-11-11 LAB — POCT UA - MICROSCOPIC ONLY

## 2010-11-11 MED ORDER — TOLTERODINE TARTRATE ER 4 MG PO CP24: 4.0000 mg | ORAL_CAPSULE | Freq: Every day | ORAL | Status: DC

## 2010-11-11 NOTE — Progress Notes (Signed)
Addended by: [place], Lejon Afzal on: 11/11/2010 03:14 PM   Modules accepted: Orders

## 2010-11-11 NOTE — Telephone Encounter (Signed)
Pt called to say her bladder has dropped and she's bleeding.  Told to go to the ED

## 2010-11-11 NOTE — Assessment & Plan Note (Signed)
Transvaginal ultrasound CBC Referral to GYN

## 2010-11-11 NOTE — Telephone Encounter (Signed)
Patient called back before RN could call her back stating her bladder has dropped and she can feel it . This has happened before. Also she has had vaginal bleeding for 2 months.states she changes pads 3-4 times daily.  Advised her to come to office at 1:30 and we will work her in.

## 2010-11-11 NOTE — Patient Instructions (Signed)
Abnormal Uterine Bleeding Abnormal uterine bleeding can have many causes. Some cases are simply treated, while others are more serious. There are several kinds of bleeding that is considered abnormal, including:  Bleeding between periods.     Bleeding after sexual intercourse.     Spotting anytime in the menstrual cycle.     Bleeding heavier or more than normal.     Bleeding after menopause.  CAUSES   There are many causes of abnormal uterine bleeding. It can be present in teenagers, pregnant women, women during their reproductive years, and women who have reached menopause. Your caregiver will look for the more common causes depending on your age, signs, symptoms and your particular circumstance. Most cases are not serious and can be treated. Even the more serious causes, like cancer of the female organs, can be treated adequately if found in the early stages. That is why all types of bleeding should be evaluated and treated as soon as possible. DIAGNOSIS   Diagnosing the cause may take several kinds of tests. Your caregiver may:  Take a complete history of the type of bleeding.     Perform a complete physical exam and Pap smear.     Take an ultrasound on the abdomen showing a picture of the female organs and the pelvis.     Inject dye into the uterus and Fallopian tubes and X-ray them (hysterosalpingogram).     Place fluid in the uterus and do an ultrasound (sonohysterogrqphy).     Take a CT scan to examine the female organs and pelvis.     Take an MRI to examine the female organs and pelvis. There is no X-ray involved with this procedure.     Look inside the uterus with a telescope that has a light at the end (hysteroscopy).     Scrap the inside of the uterus to get tissue to examine (Dilatation and Curettage, D&C).     Look into the pelvis with a telescope that has a light at the end (laparoscopy). This is done through a very small cut (incision) in the abdomen.  TREATMENT     Treatment will depend on the cause of the abnormal bleeding. It can include:  Doing nothing to allow the problem to take care of itself over time.     Hormone treatment.     Birth control pills.     Treating the medical condition causing the problem.     Laparoscopy.    Major or minor surgery     Destroying the lining of the uterus with electrical currant, laser, freezing or heat (uterine ablation).  HOME CARE INSTRUCTIONS    Follow your caregiver's recommendation on how to treat your problem.     See your caregiver if you missed a menstrual period and think you may be pregnant.     If you are bleeding heavily, count the number of pads/tampons you use and how often you have to change them. Tell this to your caregiver.     Avoid sexual intercourse until the problem is controlled.  SEEK MEDICAL CARE IF:    You have any kind of abnormal bleeding mentioned above.     You feel dizzy at times.     You are 65 years old and have not had a menstrual period yet.  SEEK IMMEDIATE MEDICAL CARE IF:    You pass out.     You are changing pads/tampons every 15 to 30 minutes.     You have belly (abdominal)  pain.     You have a temperature of 100 F (37.8 C) or higher.     You become sweaty or weak.     You are passing large blood clots from the vagina.     You start to feel sick to your stomach (nauseous) and throw up (vomit).  Document Released: 01/10/2005 Document Revised: 09/22/2010 Document Reviewed: 06/05/2008 [facility] Patient Information 2012 [place], [place].Burch Procedure for Stress Incontinence Surgery may be helpful for a problem with bladder control (urinary incontinence). Some signs that you cannot control your bladder are loss of urine with:  Straining     Coughing    Sneezing    Laughing  The goal of surgery is to provide more support for the urethra and bladder. Surgery can improve bladder function. About 80% of women are cured with surgery. The Burch  procedure is usually done under general or spinal anesthesia. The Burch procedure is described below with greater emphasis on the laparoscopic approach.   THE BURCH PROCEDURE   The Burch procedure is used to restore the bladder and urethra into their normal position. The bladder and urethra can change position through the course of normal aging and childbirth.   PROCEDURE    In this procedure, the surgeon lifts the wall of the vagina where the urethra is located.     The vaginal wall is stitched (sutured) to tissue near the pubic bone. This corrects the weakness so that the bladder remains stable during activities that might cause leakage such as coughing or sneezing.     The Burch procedure can be performed either through open abdominal surgery or by using a laparoscope. A laparoscope is a special telescope-like instrument that your surgeon can look through. This allows the surgeon to operate through small cuts (incisions). With a laparoscopic procedure, 3 or 4 quarter-inch incisions in the belly (abdomen) and groin area are necessary.     One of the main advantages of this procedure is a fast recovery time. This is because small incisions require less healing time. Women usually leave the hospital within 24 hours (same day surgery). They often return to normal activities within 7 to 14 days.  RISKS AND COMPLICATIONS   While there are many benefits of laparoscopy, there are relatively few risks. Your caregiver will discuss the risks with you before the procedure. Risks can include:  Bleeding     Infection    Injury to other organs     Anesthetic side effects  HOME CARE INSTRUCTIONS    Take all medications as directed.     You will likely have some mild discomfort in the throat if general anesthesia was used. This is from the breathing tube that was placed in your throat while you were sleeping. You will also have some mild abdominal discomfort and discomfort from the incisions where the  instruments were placed into your abdomen. Only take over-the-counter or prescription medicines for pain, discomfort, or fever as directed by your caregiver.     Resume daily activities as directed. Showers are preferred. Resume sexual activities as directed by your surgeon.  SEEK IMMEDIATE MEDICAL CARE IF:    There is increasing abdominal pain.     You develop pain in your shoulders that gets more and more severe. Some pain is common and expected because of the gas inserted into your abdomen during the procedure.     You feel light headed or faint.     You have chills or fever. If you develop  a high temperature, record these and have them ready to show your caregiver when seen.     You develop bleeding or drainage from the suture sites or vagina following surgery.  MAKE SURE YOU:    Understand these instructions.     Will watch your condition.     Will get help right away if you are not doing well or get worse.  Document Released: 04/02/2003 Document Revised: 09/22/2010 Document Reviewed: 09/03/2008 [facility] Patient Information 2012 [place].

## 2010-11-11 NOTE — Assessment & Plan Note (Signed)
U/A Urine Culture Will follow

## 2010-11-11 NOTE — Progress Notes (Signed)
  Subjective:    Patient ID: Christine Santos, female    DOB: 1945/09/15, 65 y.o.   MRN: [id number]  HPI 1. Vaginal Bleeding One month history of vaginal bleeding with clots. Sporadic. No in stool or urine. No fevers, chills, night sweats, weight loss, abdominal pain. She says she feels light headed today. She has no current vaginal bleeding.  2. Urinary frequency Patient says for the last year she has been going to the bathroom more frequently. Up to 3 times at night. She says that she does not completely empty. She often has to go back to the toilet. She has leakage when she coughs/strains/laughs. She has associated dysuria - but only last two weeks. No abdominal surgery, no c-section. Has had 3 SVD, one with laceration. Speculum exam revealed vaginal prolapse.  3. Dysuria 2 weeks of burning with urination. She says this is new. She has no fevers, chills, night sweats. She has no evidence of infection on speculum exam. No bladder tenderness.   Review of Systems  Constitutional: Negative for fever, chills, diaphoresis, activity change, appetite change, fatigue and unexpected weight change.  HENT: Negative for neck pain.   Respiratory: Negative for apnea and shortness of breath.   Cardiovascular: Negative for chest pain, palpitations and leg swelling.  Gastrointestinal: Negative for nausea, vomiting, abdominal pain, diarrhea, constipation, blood in stool, abdominal distention and rectal pain.  Genitourinary: Positive for dysuria, urgency, frequency, vaginal bleeding, vaginal discharge, difficulty urinating and menstrual problem. Negative for hematuria, flank pain, decreased urine volume, genital sores, vaginal pain and pelvic pain.  Musculoskeletal: Negative for back pain, joint swelling and arthralgias.  Skin: Negative for rash.  Neurological: Positive for light-headedness. Negative for dizziness, syncope, weakness and headaches.  Hematological: Negative for adenopathy.       Objective:   Physical Exam  Nursing note and vitals reviewed. Constitutional: She appears well-developed and well-nourished. No distress.  HENT:  Head: Normocephalic and atraumatic.  Cardiovascular: Normal rate, regular rhythm and normal heart sounds.   No murmur heard. Abdominal: Soft. She exhibits no distension. There is no tenderness.       Mild left lower quadrant tenderness  Genitourinary: Vaginal discharge found.       Vaginal prolapse, no bleeding. No sign of infection  Musculoskeletal: She exhibits no edema and no tenderness.  Skin: No rash noted. She is not diaphoretic.      Assessment & Plan:

## 2010-11-11 NOTE — Assessment & Plan Note (Signed)
[facility] Referral to GYN

## 2010-11-11 NOTE — Progress Notes (Addendum)
Made appt for pt to have US done at [facility] radiology for 10.23.2012 @ 300 pm. Informed pt that she will need to drink 32 oz of water 1 hour prior to exam so that her bladder is full at the time of the exam. Pt understood and agreed.Christine Santos

## 2010-11-12 ENCOUNTER — Other Ambulatory Visit: Payer: Self-pay | Admitting: Family Medicine

## 2010-11-12 LAB — CBC
HCT: 34.4 % — ABNORMAL LOW (ref 36.0–46.0)
MCHC: 30.2 g/dL (ref 30.0–36.0)
MCV: 97.5 fL (ref 78.0–100.0)
RDW: 16.5 % — ABNORMAL HIGH (ref 11.5–15.5)

## 2010-11-12 MED ORDER — CIPROFLOXACIN HCL 500 MG PO TABS: 500.0000 mg | ORAL_TABLET | Freq: Two times a day (BID) | ORAL | Status: AC

## 2010-11-12 NOTE — Telephone Encounter (Signed)
Wrote for cipro for uti. Informed patient.

## 2010-11-14 LAB — URINE CULTURE

## 2010-11-16 ENCOUNTER — Other Ambulatory Visit: Payer: Self-pay | Admitting: Family Medicine

## 2010-11-16 ENCOUNTER — Ambulatory Visit (HOSPITAL_COMMUNITY)
Admission: RE | Admit: 2010-11-16 | Discharge: 2010-11-16 | Disposition: A | Discharge status: Home / Self Care | Payer: Medicare Other | Source: Ambulatory Visit | Attending: Family Medicine | Admitting: Family Medicine

## 2010-11-16 ENCOUNTER — Telehealth: Payer: Self-pay | Admitting: Family Medicine

## 2010-11-16 DIAGNOSIS — D259 Leiomyoma of uterus, unspecified: Secondary | ICD-10-CM | POA: Insufficient documentation

## 2010-11-16 DIAGNOSIS — N814 Uterovaginal prolapse, unspecified: Secondary | ICD-10-CM | POA: Insufficient documentation

## 2010-11-16 DIAGNOSIS — N950 Postmenopausal bleeding: Secondary | ICD-10-CM | POA: Insufficient documentation

## 2010-11-16 MED ORDER — NITROFURANTOIN MONOHYD MACRO 100 MG PO CAPS: 100.0000 mg | ORAL_CAPSULE | Freq: Two times a day (BID) | ORAL | Status: AC

## 2010-11-16 NOTE — Telephone Encounter (Signed)
Christine Santos is calling needing clarification on the new antibiotic.  She needs to know if she is supposed to stop taking the previous one or take it and the new one together.

## 2010-11-16 NOTE — Telephone Encounter (Signed)
Christine Santos, You had seen this patient and put her on this medication. Did you take her off of other medication? ---[name]

## 2010-11-16 NOTE — Telephone Encounter (Signed)
Discussed results with patient. Getting U/S today at 2. Still having dysuria despite cipro course. Will add 3 days of nitrofurantoin. F/u with me soon.

## 2010-11-17 ENCOUNTER — Telehealth: Payer: Self-pay | Admitting: Family Medicine

## 2010-11-17 NOTE — Telephone Encounter (Signed)
Ill call her later and clarify. No one picked up.

## 2010-11-17 NOTE — Telephone Encounter (Signed)
Called to discuss U/S results with patient. Will call back later

## 2010-11-17 NOTE — Telephone Encounter (Signed)
Discussed antibiotic admin. rx Discussed U/S results. Discussed need to see GYN. Will have nursing follow up on this referral.

## 2010-11-17 NOTE — Telephone Encounter (Signed)
Left message for her to call me to discuss results.

## 2010-11-17 NOTE — Telephone Encounter (Signed)
Message copied by Lyndee Hensen on Wed Nov 17, 2010  9:48 AM ------      Message from: Katherina Mires      Created: Tue Nov 16, 2010  4:06 PM                   ----- Message -----         From: Rad Results In Interface         Sent: 11/16/2010   3:42 PM           To: Suzanna Obey, MD

## 2010-11-17 NOTE — Telephone Encounter (Signed)
Christine Santos is calling Dr. Vallarie Mare back.  She will be at her home number until about 1:30, after that, he will need to call the cell number of [phone].

## 2010-11-17 NOTE — Telephone Encounter (Signed)
Message copied by Lyndee Hensen on Wed Nov 17, 2010  4:39 PM ------      Message from: Willeen Niece      Created: Wed Nov 17, 2010  3:31 PM                   ----- Message -----         From: Rad Results In Interface         Sent: 11/16/2010   3:42 PM           To: Willeen Niece

## 2010-11-17 NOTE — Telephone Encounter (Signed)
Message copied by Lyndee Hensen on Wed Nov 17, 2010  8:40 AM ------      Message from: Katherina Mires      Created: Tue Nov 16, 2010  4:06 PM                   ----- Message -----         From: Rad Results In Interface         Sent: 11/16/2010   3:42 PM           To: Suzanna Obey, MD

## 2010-11-18 ENCOUNTER — Telehealth: Payer: Self-pay | Admitting: *Deleted

## 2010-11-18 NOTE — Telephone Encounter (Signed)
appt made for 11.06.2012 @ 1040 am with Dr. Gaetano Net at [facility]   [phone] [place] pt to call their office 24 hours in advance if she cannot keep the appt. Pt understood and agreed. Ov notes and results faxed to [phone] medical records.Christine Santos [place]

## 2010-11-19 NOTE — Telephone Encounter (Signed)
appt made pt notified.Christine Santos

## 2010-11-25 ENCOUNTER — Inpatient Hospital Stay (HOSPITAL_COMMUNITY)
Admission: RE | Admit: 2010-11-25 | Discharge: 2010-11-26 | DRG: 253 | Disposition: A | Discharge status: Home / Self Care | Payer: Medicare Other | Source: Ambulatory Visit | Attending: Interventional Cardiology | Admitting: Interventional Cardiology

## 2010-11-25 DIAGNOSIS — Y849 Medical procedure, unspecified as the cause of abnormal reaction of the patient, or of later complication, without mention of misadventure at the time of the procedure: Secondary | ICD-10-CM | POA: Diagnosis not present

## 2010-11-25 DIAGNOSIS — I70219 Atherosclerosis of native arteries of extremities with intermittent claudication, unspecified extremity: Principal | ICD-10-CM | POA: Diagnosis present

## 2010-11-25 DIAGNOSIS — Z7902 Long term (current) use of antithrombotics/antiplatelets: Secondary | ICD-10-CM

## 2010-11-25 DIAGNOSIS — Z7982 Long term (current) use of aspirin: Secondary | ICD-10-CM

## 2010-11-25 DIAGNOSIS — N189 Chronic kidney disease, unspecified: Secondary | ICD-10-CM | POA: Diagnosis present

## 2010-11-25 DIAGNOSIS — IMO0002 Reserved for concepts with insufficient information to code with codable children: Secondary | ICD-10-CM | POA: Diagnosis not present

## 2010-11-25 DIAGNOSIS — I129 Hypertensive chronic kidney disease with stage 1 through stage 4 chronic kidney disease, or unspecified chronic kidney disease: Secondary | ICD-10-CM | POA: Diagnosis present

## 2010-11-25 DIAGNOSIS — Z794 Long term (current) use of insulin: Secondary | ICD-10-CM

## 2010-11-25 HISTORY — PX: ANGIOPLASTY / STENTING FEMORAL: SUR30

## 2010-11-25 LAB — GLUCOSE, CAPILLARY
Glucose-Capillary: 182 mg/dL — ABNORMAL HIGH (ref 70–99)
Glucose-Capillary: 173 mg/dL — ABNORMAL HIGH (ref 70–99)

## 2010-11-25 LAB — POCT ACTIVATED CLOTTING TIME: Activated Clotting Time: 171 seconds

## 2010-11-26 DIAGNOSIS — M79609 Pain in unspecified limb: Secondary | ICD-10-CM

## 2010-11-26 LAB — BASIC METABOLIC PANEL
BUN: 30 mg/dL — ABNORMAL HIGH (ref 6–23)
CO2: 25 mEq/L (ref 19–32)
Calcium: 8.9 mg/dL (ref 8.4–10.5)
Chloride: 108 mEq/L (ref 96–112)
Creatinine, Ser: 1.38 mg/dL — ABNORMAL HIGH (ref 0.50–1.10)
GFR calc Af Amer: 45 mL/min — ABNORMAL LOW (ref >90)

## 2010-11-26 LAB — CBC
HCT: 27.5 % — ABNORMAL LOW (ref 36.0–46.0)
MCH: 30.7 pg (ref 26.0–34.0)
MCV: 95.8 fL (ref 78.0–100.0)
RDW: 15.7 % — ABNORMAL HIGH (ref 11.5–15.5)
WBC: 23.8 10*3/uL — ABNORMAL HIGH (ref 4.0–10.5)

## 2010-11-26 LAB — GLUCOSE, CAPILLARY: Glucose-Capillary: 141 mg/dL — ABNORMAL HIGH (ref 70–99)

## 2010-11-26 NOTE — Procedures (Signed)
  NAMECHOUA, CLOER NO.:  [id number]  MEDICAL RECORD NO.:  [id number]  LOCATION:  [facility]                         FACILITY:  [place]  PHYSICIAN:  [name], MDDATE OF BIRTH:  April 24, 1945  DATE OF PROCEDURE:  11/25/2010 DATE OF DISCHARGE:                   PERIPHERAL VASCULAR INVASIVE PROCEDURE   PRIMARY CARE PHYSICIAN:  Gwyndolyn Saxon A. Hensel, MD  PROCEDURES PERFORMED:  Left lower extremity angiogram, rotational atherectomy of the left SFA.  OPERATOR:  [name], MD  INDICATIONS:  Left leg claudication.  PROCEDURE NARRATIVE:  The right groin was infiltrated with 1% lidocaine. After being prepped and draped in usual sterile fashion, a 6-French Ansel sheath was advanced into the right iliac system.  A crossover catheter was used to obtain access to the left common iliac system and a Wholey wire was advanced into the left SFA.  The Ansel sheath was advanced to the left external iliac.  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  A 0.018 Viper wire was placed across the area of disease in the distal left SFA.  A 2.0 bur was advanced, this is a CSI system at 2.0 bur.  Several passes were made at low speed 60,000 rpm for 20-30 seconds, then few medium-speed, 90,000 rpm runs were performed.  Then 3 the high disease or 120,000 rpm runs were performed between 15-30 seconds.  There was an excellent angiographic result.  There was no visible dissection.  There was approximately 10% residual stenosis. There was no vasospasm.  Several doses of intra-arterial nitroglycerin were given.  Due to the significant improvement, we did not to go in with a balloon catheter.  The sheath was pulled back to the right external iliac.  Heparin was used for anticoagulation.  ACT was used for checking efficacy of the heparin.  IMPRESSION: 1. Successful rotational atherectomy to the left distal SFA. 2. 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  There is a 10% residual stenosis at the lesion.  RECOMMENDATIONS:  Continue aspirin and Plavix along with regular exercise.  A total of 52 mL of contrast was used.  The patient will receive continued bicarb infusion and then saline infusion after that and will be discharged later today.    [name], MD    JSV/MEDQ  D:  11/25/2010  T:  11/25/2010  Job:  [id number]  Electronically Signed by Larae Grooms MD on 11/26/2010 05:40:24 PM

## 2010-11-28 NOTE — Discharge Summary (Signed)
  NAMEFARIN, HELGESEN                 ACCOUNT NO.:  [id number]  MEDICAL RECORD NO.:  [id number]  LOCATION:  [id number]                         FACILITY:  [facility]  PHYSICIAN:  [name], MDDATE OF BIRTH:  [birth year]  DATE OF ADMISSION:  11/25/2010 DATE OF DISCHARGE:  11/26/2010                              DISCHARGE SUMMARY   FINAL DIAGNOSES: 1. Left lower extremity claudication. 2. Successful rotational atherectomy to calcified lesion of the left     superficial femoral artery. 3. 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 4. Hypertension.  PROCEDURES PERFORMED: 1. Left lower extremity angiogram with rotational atherectomy of the     left distal SFA. 2. Right groin ultrasound with subsequent ultrasound-guided     compression. 3. Follow up ultrasound to document that the pseudoaneurysm was     closed.  HOSPITAL COURSE:  The patient underwent a PV procedure as noted above. It was successful.  She was in the short stay area and was planned to be discharged on the same day, but had recurrent bleeding after getting up and walking.  She was then admitted overnight.  We got an ultrasound showing the pseudoaneurysm.  This was treated as noted above with ultrasound-guided compression after this was documented that the compression was successful to close the pseudoaneurysm and that the right common femoral artery and femoral vein were patent.  The patient finished her bed rest and was discharged home.  She did have a significant bruise in her right groin.  Her hemoglobin was 8.8 at the time of discharge which was about a 1 g drop from preprocedure.  DISCHARGE MEDICATIONS: 1. Aspirin 325 mg p.o. daily. 2. Lisinopril 40 mg p.o. daily. 3. Paxil 20 mg p.o. daily. 4. Felodipine 20 mg p.o. daily. 5. Protonix 40 mg p.o. daily. 6. Fish oil 2 g b.i.d. 7. Plavix 75 mg daily. 8. Lantus 27 units subcu every morning. 9. Allopurinol 150  mg p.o. daily. 10.Hydrochlorothiazide 25 mg p.o. daily. 11.Trilipix 45 mg p.o. daily. 12.Multivitamin. 13.Garlic extract. 14.Vitamin C. 15.Macular degenerative eye supplement. 16.Tramadol 50 mg p.o. q.6 h. p.r.n. 17.Metoprolol 12.5 mg p.o. b.i.d. 18.Atorvastatin 40 mg p.o. at bedtime.  ACTIVITY:  Increase activity slowly.  No lifting more than 10 pounds for about a week.  Follow up with Dr. Irish Lack next week.  We will also have him followup ultrasound.  Diet; low-sodium, heart healthy diet.  Of note, her blood pressure was somewhat high in the hospital and her blood pressure medicines were titrated.  This will need to be followed as an outpatient.  Of note, given that she is on allopurinol for likely gout, we may need to stop hydrochlorothiazide as an antihypertensive.  45 minutes wasspent on this discharge, coordinating the ultrasounds and compression treatment,along with discussions with the patient and family.     [name], MD     JSV/MEDQ  D:  11/27/2010  T:  11/27/2010  Job:  [id number]  Electronically Signed by Larae Grooms MD on 11/28/2010 01:26:48 AM

## 2010-12-06 ENCOUNTER — Ambulatory Visit (HOSPITAL_COMMUNITY)
Admission: RE | Admit: 2010-12-06 | Discharge: 2010-12-06 | Disposition: A | Discharge status: Home / Self Care | Payer: Medicare Other | Source: Ambulatory Visit | Attending: Interventional Cardiology | Admitting: Interventional Cardiology

## 2010-12-06 DIAGNOSIS — I729 Aneurysm of unspecified site: Secondary | ICD-10-CM | POA: Insufficient documentation

## 2010-12-06 DIAGNOSIS — M79609 Pain in unspecified limb: Secondary | ICD-10-CM

## 2010-12-06 DIAGNOSIS — R1030 Lower abdominal pain, unspecified: Secondary | ICD-10-CM

## 2010-12-06 NOTE — Progress Notes (Signed)
*  PRELIMINARY RESULTS*   Right groin ultrasound completed.  Pseudoaneurysm remains closed.   Sharion Dove Renee 12/06/2010, 2:09 PM

## 2010-12-07 ENCOUNTER — Other Ambulatory Visit: Payer: Self-pay | Admitting: Family Medicine

## 2010-12-07 NOTE — Telephone Encounter (Signed)
Refill request

## 2010-12-10 ENCOUNTER — Other Ambulatory Visit: Payer: Self-pay | Admitting: Obstetrics and Gynecology

## 2010-12-13 ENCOUNTER — Other Ambulatory Visit: Payer: Self-pay | Admitting: Oncology

## 2010-12-13 ENCOUNTER — Telehealth: Payer: Self-pay | Admitting: *Deleted

## 2010-12-13 ENCOUNTER — Other Ambulatory Visit (HOSPITAL_BASED_OUTPATIENT_CLINIC_OR_DEPARTMENT_OTHER): Payer: Medicare Other | Admitting: Lab

## 2010-12-13 DIAGNOSIS — C9110 Chronic lymphocytic leukemia of B-cell type not having achieved remission: Secondary | ICD-10-CM

## 2010-12-13 DIAGNOSIS — N289 Disorder of kidney and ureter, unspecified: Secondary | ICD-10-CM

## 2010-12-13 LAB — CBC WITH DIFFERENTIAL/PLATELET
Basophils Absolute: 0.1 10*3/uL (ref 0.0–0.1)
HCT: 29.3 % — ABNORMAL LOW (ref 34.8–46.6)
HGB: 9.5 g/dL — ABNORMAL LOW (ref 11.6–15.9)
MONO#: 0.8 10*3/uL (ref 0.1–0.9)
NEUT%: 19.7 % — ABNORMAL LOW (ref 38.4–76.8)
Platelets: 251 10*3/uL (ref 145–400)
WBC: 23.9 10*3/uL — ABNORMAL HIGH (ref 3.9–10.3)
lymph#: 18.1 10*3/uL — ABNORMAL HIGH (ref 0.9–3.3)

## 2010-12-13 LAB — COMPREHENSIVE METABOLIC PANEL
ALT: 13 U/L (ref 0–35)
BUN: 47 mg/dL — ABNORMAL HIGH (ref 6–23)
CO2: 21 mEq/L (ref 19–32)
Calcium: 9.2 mg/dL (ref 8.4–10.5)
Chloride: 112 mEq/L (ref 96–112)
Creatinine, Ser: 1.91 mg/dL — ABNORMAL HIGH (ref 0.50–1.10)
Glucose, Bld: 119 mg/dL — ABNORMAL HIGH (ref 70–99)

## 2010-12-13 LAB — LACTATE DEHYDROGENASE: LDH: 198 U/L (ref 94–250)

## 2010-12-13 NOTE — Telephone Encounter (Signed)
Pt returned call and I relayed message from Dr. Lamonte Sakai to her regarding her lab work and planned appt in 2 months.  Pt verbalized understanding.

## 2010-12-13 NOTE — Telephone Encounter (Signed)
Message copied by Maudie Mercury on Mon Dec 13, 2010  4:32 PM ------      Message from: HA, Trudee Grip T      Created: Mon Dec 13, 2010  3:45 PM       Please call pt.  WBC and Hgb are stable.  Continue observation.  I'll put in a POF for her to see me in about 2 months.  Thanks.

## 2010-12-13 NOTE — Telephone Encounter (Signed)
Left VM for pt to return call when she gets a chance.

## 2010-12-14 ENCOUNTER — Other Ambulatory Visit: Payer: Self-pay | Admitting: Family Medicine

## 2010-12-14 ENCOUNTER — Telehealth: Payer: Self-pay | Admitting: Oncology

## 2010-12-14 DIAGNOSIS — Z1231 Encounter for screening mammogram for malignant neoplasm of breast: Secondary | ICD-10-CM

## 2010-12-14 NOTE — Telephone Encounter (Signed)
S/w the pt and she is aware of her feb 2013 appts

## 2010-12-21 ENCOUNTER — Encounter: Payer: Self-pay | Admitting: Home Health Services

## 2010-12-23 ENCOUNTER — Other Ambulatory Visit: Payer: Self-pay | Admitting: Family Medicine

## 2010-12-23 NOTE — Telephone Encounter (Signed)
Refill request

## 2011-01-11 ENCOUNTER — Ambulatory Visit
Admission: RE | Admit: 2011-01-11 | Discharge: 2011-01-11 | Disposition: A | Discharge status: Home / Self Care | Payer: Medicare Other | Source: Ambulatory Visit | Attending: Family Medicine | Admitting: Family Medicine

## 2011-01-11 DIAGNOSIS — Z1231 Encounter for screening mammogram for malignant neoplasm of breast: Secondary | ICD-10-CM

## 2011-01-12 ENCOUNTER — Encounter: Payer: Self-pay | Admitting: Family Medicine

## 2011-01-15 ENCOUNTER — Other Ambulatory Visit: Payer: Self-pay | Admitting: Family Medicine

## 2011-01-19 NOTE — Telephone Encounter (Signed)
Refill request

## 2011-02-02 ENCOUNTER — Other Ambulatory Visit: Payer: Self-pay | Admitting: Family Medicine

## 2011-02-02 MED ORDER — INSULIN GLARGINE 100 UNIT/ML ~~LOC~~ SOLN: 27.0000 [IU] | Freq: Every day | SUBCUTANEOUS | Status: DC

## 2011-02-11 ENCOUNTER — Encounter: Payer: Self-pay | Admitting: *Deleted

## 2011-02-26 ENCOUNTER — Other Ambulatory Visit: Payer: Self-pay | Admitting: Family Medicine

## 2011-02-27 NOTE — Telephone Encounter (Signed)
Refill request

## 2011-02-28 ENCOUNTER — Other Ambulatory Visit: Payer: Medicare Other | Admitting: Lab

## 2011-02-28 ENCOUNTER — Ambulatory Visit (HOSPITAL_BASED_OUTPATIENT_CLINIC_OR_DEPARTMENT_OTHER): Payer: Medicare Other | Admitting: Oncology

## 2011-02-28 ENCOUNTER — Telehealth: Payer: Self-pay | Admitting: Oncology

## 2011-02-28 DIAGNOSIS — I10 Essential (primary) hypertension: Secondary | ICD-10-CM

## 2011-02-28 DIAGNOSIS — E1165 Type 2 diabetes mellitus with hyperglycemia: Secondary | ICD-10-CM

## 2011-02-28 DIAGNOSIS — E118 Type 2 diabetes mellitus with unspecified complications: Secondary | ICD-10-CM

## 2011-02-28 DIAGNOSIS — C9110 Chronic lymphocytic leukemia of B-cell type not having achieved remission: Secondary | ICD-10-CM

## 2011-02-28 DIAGNOSIS — N189 Chronic kidney disease, unspecified: Secondary | ICD-10-CM

## 2011-02-28 DIAGNOSIS — E119 Type 2 diabetes mellitus without complications: Secondary | ICD-10-CM

## 2011-02-28 LAB — COMPREHENSIVE METABOLIC PANEL
ALT: 15 U/L (ref 0–35)
AST: 23 U/L (ref 0–37)
Calcium: 9.1 mg/dL (ref 8.4–10.5)
Chloride: 109 mEq/L (ref 96–112)
Creatinine, Ser: 1.72 mg/dL — ABNORMAL HIGH (ref 0.50–1.10)
Potassium: 5.2 mEq/L (ref 3.5–5.3)
Sodium: 141 mEq/L (ref 135–145)
Total Protein: 6.1 g/dL (ref 6.0–8.3)

## 2011-02-28 LAB — CBC WITH DIFFERENTIAL/PLATELET
BASO%: 0.1 % (ref 0.0–2.0)
EOS%: 2 % (ref 0.0–7.0)
HCT: 31.8 % — ABNORMAL LOW (ref 34.8–46.6)
LYMPH%: 77.6 % — ABNORMAL HIGH (ref 14.0–49.7)
MCH: 30.2 pg (ref 25.1–34.0)
MCHC: 32.7 g/dL (ref 31.5–36.0)
NEUT%: 15.2 % — ABNORMAL LOW (ref 38.4–76.8)
lymph#: 21.1 10*3/uL — ABNORMAL HIGH (ref 0.9–3.3)

## 2011-02-28 NOTE — Progress Notes (Signed)
[place] OFFICE PROGRESS NOTE  Cc:  Zigmund Gottron, MD, MD  DIAGNOSIS:  Chronic lymphocytic leukemia, stage 0, since 1996; now stage II; ZAP-70 neg; CD-38 neg; del 13q positive.  COMORBID CONDITIONS: 1.  Chronic kidney disease 2.  Normocytic anemia  3.  Diastolic heart failure requiring diuretics and oxygen. 4.  Diabetes Mellitus, type II. 5.  Hypertension. 6.  Hyperlipidemia. 7.  Coronary artery disease; s/p CAGB in 1999. 8.  Peripheral vascular disease.  PAST TREATMENT:  monthly chlorambucil on between Jan 2011 and Jan 2012.   CURRENT TREATMENT:  active observation.  INTERVAL HISTORY: Christine Santos 66 y.o. female returns for regular follow up with her boyfriend.  She reports that she recently had lower extremity stents placement by her cardiologist for arterial insufficiency and vascular disease.  She still has bilateral knee pain and has problem getting up from a squatting position.  She has not seen an orthopedic physician.  She has mild fatigue which has been stable the past few years.  She is still independent of all activities of daily living.  She denies anorexia, weight loss, palpable node swelling, mucositis, nausea/vomiting, bleeding symptoms.  She has mild pedal edema for years.  She denies skin rash or back/hip pain, neuropathy, bowel/bladder incontinence.   MEDICAL HISTORY: Past Medical History  Diagnosis Date  . CLL (chronic lymphoblastic leukemia)     SURGICAL HISTORY: No past surgical history on file.  MEDICATIONS: Current Outpatient Prescriptions  Medication Sig Dispense Refill  . acetaminophen (TYLENOL) 500 MG tablet 500 mg. Take one or two prn up to 6 per day total       . allopurinol (ZYLOPRIM) 300 MG tablet TAKE 1/2 TABLET BY MOUTH EVERY DAY  15 tablet  0  . Ascorbic Acid (VITAMIN C) 100 MG tablet Take 100 mg by mouth daily.      Marland Kitchen aspirin 325 MG tablet Take 162.5 mg by mouth daily. Take 1/2 tab by mouth daily      .  atorvastatin (LIPITOR) 20 MG tablet Take 40 mg by mouth daily.       . Choline Fenofibrate (TRILIPIX) 45 MG capsule Take 45 mg by mouth daily. Per edmunds      . clopidogrel (PLAVIX) 75 MG tablet Take 1 tablet (75 mg total) by mouth daily.  90 tablet  3  . diphenhydramine-acetaminophen (TYLENOL PM) 25-500 MG TABS Take 1 tablet by mouth at bedtime as needed.      . felodipine (PLENDIL) 10 MG 24 hr tablet Take 20 mg by mouth daily.        Marland Kitchen glucose blood (ACCU-CHEK COMFORT CURVE) test strip Check blood sugars three times a day      . GNP GARLIC EXTRACT PO Take 1 tablet by mouth daily.        . hydrochlorothiazide (HYDRODIURIL) 25 MG tablet TAKE 1 TABLET BY MOUTH EVERY DAY  90 tablet  0  . insulin glargine (LANTUS SOLOSTAR) 100 UNIT/ML injection Inject 27 Units into the skin daily.  15 mL  12  . Insulin Pen Needle (B-D UF III MINI PEN NEEDLES) 31G X 5 MM MISC Disp needles to supply once daily injection. 1 box       . lisinopril (PRINIVIL,ZESTRIL) 40 MG tablet Take 40 mg by mouth daily.        . metoprolol (LOPRESSOR) 25 MG tablet Take 12.5 mg by mouth 2 (two) times daily.       . Multiple Vitamins-Minerals (MULTIVITAMINS THER.  W/MINERALS) TABS Take 1 tablet by mouth daily.        . Omega-3 Fatty Acids (FISH OIL) 1000 MG CAPS Take 2 capsules by mouth 2 (two) times daily.       Marland Kitchen OVER THE COUNTER MEDICATION Take 1 tablet by mouth daily. Macular Degenerative Eye Supplement       . pantoprazole (PROTONIX) 40 MG tablet Take 40 mg by mouth daily.        Marland Kitchen PARoxetine (PAXIL) 20 MG tablet Take 1 tablet (20 mg total) by mouth every morning.  90 tablet  3  . traMADol (ULTRAM) 50 MG tablet TAKE 1 TABLET BY MOUTH EVERY 6 HOURS AS NEEDED FOR PAIN  60 tablet  0  . tolterodine (DETROL LA) 4 MG 24 hr capsule Take 1 capsule (4 mg total) by mouth daily.  30 capsule  2    ALLERGIES:  is allergic to amoxicillin; codeine phosphate; and keflex.  REVIEW OF SYSTEMS:  The rest of the 14-point review of system was  negative.   Filed Vitals:   02/28/11 1346  BP: 184/65  Pulse: 58  Temp: 98.3 F (36.8 C)   Wt Readings from Last 3 Encounters:  02/28/11 181 lb 3.2 oz (82.192 kg)  10/18/10 176 lb 6.4 oz (80.015 kg)  11/26/10 179 lb 10.8 oz (81.5 kg)   ECOG Performance status: 1  PHYSICAL EXAMINATION:   General:  Mildly obese woman in no acute distress.  Eyes:  no scleral icterus.  ENT:  There were no oropharyngeal lesions.  Neck was without thyromegaly.  Lymphatics:  Negative cervical, supraclavicular or axillary adenopathy.  Respiratory: lungs were clear bilaterally without wheezing or crackles.  Cardiovascular:  Regular rate and rhythm, S1/S2, without murmur, rub or gallop.  There was 1+ bilateral pedal edema.  GI:  abdomen was soft, flat, nontender, nondistended, without organomegaly.  Muscoloskeletal:  no spinal tenderness of palpation of vertebral spine.  Skin exam was without echymosis, petichae.  Neuro exam was nonfocal.  Patient was able to get on and off exam table without assistance.  Gait was normal.  Patient was alerted and oriented.  Attention was good.   Language was appropriate.  Mood was normal without depression.  Speech was not pressured.  Thought content was not tangential.    LABORATORY/RADIOLOGY DATA:  Lab Results  Component Value Date   WBC 27.2* 02/28/2011   HGB 10.4* 02/28/2011   HCT 31.8* 02/28/2011   PLT 286 02/28/2011   GLUCOSE 119* 12/13/2010   CHOL 107 12/04/2008   TRIG 157* 12/04/2008   HDL 34* 12/04/2008   LDLCALC 42 12/04/2008   ALT 13 12/13/2010   AST 20 12/13/2010   NA 143 12/13/2010   K 4.8 12/13/2010   CL 112 12/13/2010   CREATININE 1.91* 12/13/2010   BUN 47* 12/13/2010   CO2 21 12/13/2010   TSH 1.347 01/15/2009   INR 1.01 01/30/2009   HGBA1C 6.4 05/19/2010     ASSESSMENT AND PLAN:   1. CLL:  I discussed with Christine Santos that her CLL is stable.  She does have slight increase in lymphocytosis.  However, she does not have severe B-symptoms, severe anemia,  thrombocytopenia, recurrent infection.  I thus recommended again watchful observation. In the future, if she needs treatment, I may consider Bendamustine/Rituxan if she needs treatment in the future as her performance status now has significantly improved compared to when I first met her.  2. Hypertension:  She is on hydrochlorothiazide, lisinopril, metoprolol.  Her BP is  still slightly elevated.  I defer to her PCP for titration of her BP meds as appropriate.  3. Diabetes mellitus:  She is on insulin per primary care physician. 4. Chronic renal insufficiency:  most likely due to her chronic HTN, DM.  In the past, she had renal bx which showed CLL; however, at that time her leukocytosis was in the range of 50's.  Today, her leukocytosis isn't as severe, and thus, I'm more inclined to attribute her CKD to HTN, DM as opposed to rapid progression of CLL requiring treatment.  5. Hyperlipidemia:  She is on fish oil, and atorvastatin per primary care physician. 6. Peripheral vascular disease:  She is on aspirin and Plavix per cardiologist.  She had stent in 2012.  7. Follow up:  Lab in 3 months.  RV with me in 6 months.

## 2011-02-28 NOTE — Telephone Encounter (Signed)
appts made  And printed for may and aug  aom

## 2011-03-18 ENCOUNTER — Encounter: Payer: Self-pay | Admitting: Family Medicine

## 2011-03-18 ENCOUNTER — Ambulatory Visit (INDEPENDENT_AMBULATORY_CARE_PROVIDER_SITE_OTHER): Payer: Medicare Other | Admitting: Family Medicine

## 2011-03-18 VITALS — BP 159/77 | HR 61 | Temp 97.6°F | Ht 65.5 in | Wt 180.4 lb

## 2011-03-18 DIAGNOSIS — N183 Chronic kidney disease, stage 3 unspecified: Secondary | ICD-10-CM

## 2011-03-18 DIAGNOSIS — C9110 Chronic lymphocytic leukemia of B-cell type not having achieved remission: Secondary | ICD-10-CM

## 2011-03-18 DIAGNOSIS — E1165 Type 2 diabetes mellitus with hyperglycemia: Secondary | ICD-10-CM

## 2011-03-18 DIAGNOSIS — M159 Polyosteoarthritis, unspecified: Secondary | ICD-10-CM

## 2011-03-18 DIAGNOSIS — E118 Type 2 diabetes mellitus with unspecified complications: Secondary | ICD-10-CM

## 2011-03-18 DIAGNOSIS — I10 Essential (primary) hypertension: Secondary | ICD-10-CM

## 2011-03-18 DIAGNOSIS — IMO0002 Reserved for concepts with insufficient information to code with codable children: Secondary | ICD-10-CM

## 2011-03-18 MED ORDER — CLONIDINE HCL 0.1 MG/24HR TD PTWK: 1.0000 | MEDICATED_PATCH | TRANSDERMAL | Status: DC

## 2011-03-18 NOTE — Patient Instructions (Signed)
You seem to be doing well except for your blood pressure I sent one new prescription for a patch for your blood pressure.  You change it once a week. See me in one month to check on your blood pressure.

## 2011-03-18 NOTE — Progress Notes (Signed)
  Subjective:    Patient ID: Christine Santos, female    DOB: 1945-05-11, 66 y.o.   MRN: [id number]  HPI Had bilateral cataract surgery and arthroscopic left leg and arterial Rt leg surg since last visit. C/O Lt knee pain again after her surgery Diabetes control excellent    Review of Systems Denies CP, SOB, bleeding or syncope     Objective:   Physical Exam Lungs clear Cardiac rrr without m or g abd benign Ext Left knee ligaments intact, no effusion, + crepitis Diabetic foot exam normal pulses and sensation       Assessment & Plan:

## 2011-03-18 NOTE — Assessment & Plan Note (Signed)
Good control

## 2011-03-18 NOTE — Assessment & Plan Note (Signed)
Poor control despite multiple meds, add clonidine.

## 2011-03-18 NOTE — Assessment & Plan Note (Signed)
Followed by onc and good control per recent blood work

## 2011-03-18 NOTE — Assessment & Plan Note (Signed)
Discussed options for knee osteoarthritis

## 2011-03-18 NOTE — Assessment & Plan Note (Signed)
Recent blood work at baseline

## 2011-03-23 ENCOUNTER — Other Ambulatory Visit: Payer: Self-pay | Admitting: Family Medicine

## 2011-03-23 NOTE — Telephone Encounter (Signed)
Refill request

## 2011-03-29 ENCOUNTER — Encounter: Payer: Self-pay | Admitting: Home Health Services

## 2011-03-29 ENCOUNTER — Ambulatory Visit (INDEPENDENT_AMBULATORY_CARE_PROVIDER_SITE_OTHER): Payer: Medicare Other | Admitting: Home Health Services

## 2011-03-29 VITALS — BP 160/72 | HR 42 | Temp 97.9°F | Ht 65.0 in | Wt 182.7 lb

## 2011-03-29 DIAGNOSIS — Z0000 Encounter for general adult medical examination without abnormal findings: Secondary | ICD-10-CM

## 2011-03-29 NOTE — Patient Instructions (Signed)
1. Continue to focus on increasing fruits and vegetables. 2. Consider starting an exercise routine, possible biking. 3. Continue to monitor your blood pressure and blood sugar.  Keep a record if possible and bring to appointments.

## 2011-03-29 NOTE — Progress Notes (Signed)
Patient here for annual wellness visit, patient reports: Risk Factors/Conditions needing evaluation or treatment: Pt does not have any risk factors that need evaluation. Home Safety: Pt lives with boyfriend in 1 story home.  Pt reports having smoke detectors and does not have adaptive equipment in bathroom. Other Information: Corrective lens: Pt previously has had cataract removed but wears glasses daily.  Pt reports visit eye doctor annually. Dentures: Pt has full dentures on top, partial on bottom.  No regular dentist appts. Memory: Pt denies memory problems. Patient's Mini Mental Score (recorded in doc. flowsheet): 28  Balance/Gait: Pt reports arthritis in knees. Affects gait  Balance Abnormal Patient value  Sitting balance    Sit to stand    Attempts to arise    Immediate standing balance    Standing balance    Nudge    Eyes closed- Romberg    Tandem stance x Unable to do  Back lean    Neck Rotation    360 degree turn    Sitting down     Gait Abnormal Patient value  Initiation of gait    Step length-left    Step length-right    Step height-left x Drags heels  Step height-right x Drags heels  Step symmetry    Step continuity    Path deviation    Trunk movement    Walking stance        Annual Wellness Visit Requirements Recorded Today In  Medical, family, social history Past Medical, Family, Social History Section  Current providers Care team  Current medications Medications  Wt, BP, Ht, BMI Vital signs  Hearing assessment (welcome visit) declined  Tobacco, alcohol, illicit drug use History  ADL Nurse Assessment  Depression Screening Nurse Assessment  Cognitive impairment Nurse Assessment  Mini Mental Status Document Flowsheet  Fall Risk Nurse Assessment  Home Safety Progress Note  End of Life Planning (welcome visit) Social Documentation  Medicare preventative services Progress Note  Risk factors/conditions needing evaluation/treatment Progress Note    Personalized health advice Patient Instructions, goals, letter  Diet & Exercise Social Documentation  Emergency Contact Social Documentation  Seat Belts Social Documentation  Sun exposure/protection Social Documentation    Prevention Plan:   Recommended Medicare Prevention Screenings Women over 65 Test For Frequency Date of Last- BOLD if needed  Breast Cancer 1-2 yrs 12/12  Cervical Cancer 1-3 yrs Not indicated due to age  Colorectal Cancer 1-10 yrs 6/04  Osteoporosis once recommended  Cholesterol 5 yrs 11/10  Diabetes yearly 2/13  HIV yearly declined  Influenza Shot yearly 10/12  Pneumonia Shot once 12/10  Zostavax Shot once 10/08

## 2011-03-30 ENCOUNTER — Encounter: Payer: Self-pay | Admitting: Home Health Services

## 2011-03-30 NOTE — Progress Notes (Signed)
  Subjective:    Patient ID: Christine Santos, female    DOB: [birth year], 66 y.o.   MRN: [id number]  HPI I have reviewed this visit and discussed with Lamont Dowdy and agree with her documentation     Review of Systems     Objective:   Physical Exam        Assessment & Plan:

## 2011-04-13 ENCOUNTER — Encounter: Payer: Self-pay | Admitting: Family Medicine

## 2011-04-13 ENCOUNTER — Ambulatory Visit (INDEPENDENT_AMBULATORY_CARE_PROVIDER_SITE_OTHER): Payer: Medicare Other | Admitting: Family Medicine

## 2011-04-13 DIAGNOSIS — E7800 Pure hypercholesterolemia, unspecified: Secondary | ICD-10-CM

## 2011-04-13 DIAGNOSIS — I10 Essential (primary) hypertension: Secondary | ICD-10-CM

## 2011-04-13 DIAGNOSIS — M109 Gout, unspecified: Secondary | ICD-10-CM

## 2011-04-13 MED ORDER — CLONIDINE HCL 0.2 MG/24HR TD PTWK: 1.0000 | MEDICATED_PATCH | TRANSDERMAL | Status: DC

## 2011-04-13 MED ORDER — ALLOPURINOL 300 MG PO TABS: 150.0000 mg | ORAL_TABLET | Freq: Every day | ORAL | Status: DC

## 2011-04-13 NOTE — Patient Instructions (Signed)
I increased your new blood pressure med I also sent a 3 month Rx for the allopurinol I did a lipid panel today.  I will call results and send a letter that you can show your cardiologist. See me in 4-6 weeks in the blood pressure is more than 140/90 See me after you see your cardiologist if your blood pressure is good which means less than 140/90

## 2011-04-13 NOTE — Assessment & Plan Note (Signed)
Wants 90 day supply.  Stable, no attacks

## 2011-04-14 ENCOUNTER — Encounter: Payer: Self-pay | Admitting: Family Medicine

## 2011-04-14 LAB — LIPID PANEL
Cholesterol: 128 mg/dL (ref 0–200)
HDL: 44 mg/dL (ref >39)
Total CHOL/HDL Ratio: 2.9 Ratio

## 2011-04-14 NOTE — Assessment & Plan Note (Signed)
Still not well controled.  Increase clonidine patch.

## 2011-04-14 NOTE — Progress Notes (Signed)
  Subjective:    Patient ID: Christine Santos, female    DOB: Dec 11, 1945, 66 y.o.   MRN: [id number]  HPI Follow up HBP.  Tolerating patch well.  No CP, SOB or syncope.    Review of Systems     Objective:   Physical Exam BP still up. Lungs clear Cardiac RRR with 1/6 SEM Ext trace edema       Assessment & Plan:

## 2011-04-26 ENCOUNTER — Other Ambulatory Visit: Payer: Self-pay | Admitting: Family Medicine

## 2011-04-26 DIAGNOSIS — I10 Essential (primary) hypertension: Secondary | ICD-10-CM

## 2011-04-26 NOTE — Assessment & Plan Note (Signed)
Refilled based on e request.

## 2011-04-30 ENCOUNTER — Other Ambulatory Visit: Payer: Self-pay | Admitting: Family Medicine

## 2011-05-04 ENCOUNTER — Telehealth: Payer: Self-pay | Admitting: Family Medicine

## 2011-05-04 MED ORDER — PANTOPRAZOLE SODIUM 40 MG PO TBEC: 40.0000 mg | DELAYED_RELEASE_TABLET | Freq: Every day | ORAL | Status: DC

## 2011-05-04 NOTE — Telephone Encounter (Signed)
Spoke to patient and told Rx sent

## 2011-05-04 NOTE — Telephone Encounter (Signed)
States that the pharmacy has said they have faxed request for pantoprazole (PROTONIX) 40 MG tablet And has not heard back from [place].  She is now out.  She usually gets a 90 day supply

## 2011-05-04 NOTE — Telephone Encounter (Addendum)
Will route message to Dr. Andria Frames. Left message on voicemail that this will be done today.

## 2011-05-26 ENCOUNTER — Other Ambulatory Visit: Payer: Self-pay | Admitting: Family Medicine

## 2011-05-26 DIAGNOSIS — E7800 Pure hypercholesterolemia, unspecified: Secondary | ICD-10-CM

## 2011-05-26 NOTE — Assessment & Plan Note (Signed)
E refill

## 2011-05-30 ENCOUNTER — Telehealth: Payer: Self-pay | Admitting: *Deleted

## 2011-05-30 ENCOUNTER — Other Ambulatory Visit (HOSPITAL_BASED_OUTPATIENT_CLINIC_OR_DEPARTMENT_OTHER): Payer: Medicare Other | Admitting: Lab

## 2011-05-30 DIAGNOSIS — E1165 Type 2 diabetes mellitus with hyperglycemia: Secondary | ICD-10-CM

## 2011-05-30 DIAGNOSIS — C9110 Chronic lymphocytic leukemia of B-cell type not having achieved remission: Secondary | ICD-10-CM

## 2011-05-30 DIAGNOSIS — E118 Type 2 diabetes mellitus with unspecified complications: Secondary | ICD-10-CM

## 2011-05-30 LAB — CBC WITH DIFFERENTIAL/PLATELET
BASO%: 0.2 % (ref 0.0–2.0)
EOS%: 1.1 % (ref 0.0–7.0)
LYMPH%: 84.6 % — ABNORMAL HIGH (ref 14.0–49.7)
MCHC: 29.8 g/dL — ABNORMAL LOW (ref 31.5–36.0)
MCV: 96.3 fL (ref 79.5–101.0)
MONO%: 1.8 % (ref 0.0–14.0)
Platelets: 263 10*3/uL (ref 145–400)
RBC: 3.62 10*6/uL — ABNORMAL LOW (ref 3.70–5.45)
WBC: 35.1 10*3/uL — ABNORMAL HIGH (ref 3.9–10.3)
nRBC: 0 % (ref 0–0)

## 2011-05-30 LAB — BASIC METABOLIC PANEL
Calcium: 8.9 mg/dL (ref 8.4–10.5)
Creatinine, Ser: 2 mg/dL — ABNORMAL HIGH (ref 0.50–1.10)

## 2011-05-30 NOTE — Telephone Encounter (Signed)
Message copied by Maudie Mercury on Mon May 30, 2011  5:05 PM ------      Message from: HA, Trudee Grip T      Created: Mon May 30, 2011  1:01 PM       Please call pt.  Her WBC (from CLL) has slightly increased.  However, Hgb and Plt still stable.  Will continue observation for now (unless she has significant constitutional symptoms).  Thanks.

## 2011-05-30 NOTE — Telephone Encounter (Signed)
Spoke w/ pt and informed of Dr. Agustina Caroli message that her WBC slightly increased, Hgb and Plt stable.  Keep appt in Aug as scheduled.  Pt denies any new symptoms or problems.  Instructed her to call if any changes in how she feels,  Increased fatigue, decreased appetite, or any other concerns.  She verbalized understanding.

## 2011-07-05 ENCOUNTER — Other Ambulatory Visit: Payer: Self-pay | Admitting: Family Medicine

## 2011-08-03 ENCOUNTER — Other Ambulatory Visit: Payer: Self-pay | Admitting: Family Medicine

## 2011-08-03 DIAGNOSIS — M159 Polyosteoarthritis, unspecified: Secondary | ICD-10-CM

## 2011-08-04 ENCOUNTER — Other Ambulatory Visit: Payer: Medicare Other | Admitting: Lab

## 2011-08-04 NOTE — Assessment & Plan Note (Signed)
Called in refill for tramadol.

## 2011-08-23 ENCOUNTER — Other Ambulatory Visit: Payer: Self-pay | Admitting: Family Medicine

## 2011-08-26 NOTE — Patient Instructions (Addendum)
1.  Diagnosis:  Chronic lymphocytic lymphoma:  If worsening of anemia and kidney function.  We may need to repeat bone marrow biopsy to determine burden of disease.  If high burden of disease, we may need to consider restarting a different chemo. 2.  If needed to start chemo again, here are the options.  - Attempt chlorambucil chemo pill again.  OR -IV Rituxan/Bendamustine d1, d2 every 4 weeks;  Day 3 Neulasta to decrease risk of infection.  Goal for 6 cycles.

## 2011-08-29 ENCOUNTER — Encounter: Payer: Self-pay | Admitting: Oncology

## 2011-08-29 ENCOUNTER — Other Ambulatory Visit (HOSPITAL_BASED_OUTPATIENT_CLINIC_OR_DEPARTMENT_OTHER): Payer: Medicare Other | Admitting: Lab

## 2011-08-29 ENCOUNTER — Telehealth: Payer: Self-pay | Admitting: Oncology

## 2011-08-29 ENCOUNTER — Ambulatory Visit (HOSPITAL_BASED_OUTPATIENT_CLINIC_OR_DEPARTMENT_OTHER): Payer: Medicare Other | Admitting: Oncology

## 2011-08-29 VITALS — BP 161/60 | HR 18 | Temp 97.8°F | Ht 65.0 in | Wt 188.7 lb

## 2011-08-29 DIAGNOSIS — I10 Essential (primary) hypertension: Secondary | ICD-10-CM

## 2011-08-29 DIAGNOSIS — E118 Type 2 diabetes mellitus with unspecified complications: Secondary | ICD-10-CM

## 2011-08-29 DIAGNOSIS — D631 Anemia in chronic kidney disease: Secondary | ICD-10-CM | POA: Insufficient documentation

## 2011-08-29 DIAGNOSIS — C9110 Chronic lymphocytic leukemia of B-cell type not having achieved remission: Secondary | ICD-10-CM

## 2011-08-29 DIAGNOSIS — E1165 Type 2 diabetes mellitus with hyperglycemia: Secondary | ICD-10-CM

## 2011-08-29 DIAGNOSIS — N289 Disorder of kidney and ureter, unspecified: Secondary | ICD-10-CM

## 2011-08-29 DIAGNOSIS — D649 Anemia, unspecified: Secondary | ICD-10-CM

## 2011-08-29 DIAGNOSIS — N183 Chronic kidney disease, stage 3 unspecified: Secondary | ICD-10-CM

## 2011-08-29 HISTORY — DX: Anemia, unspecified: D64.9

## 2011-08-29 LAB — TECHNOLOGIST REVIEW

## 2011-08-29 LAB — CBC WITH DIFFERENTIAL/PLATELET
BASO%: 0.3 % (ref 0.0–2.0)
Eosinophils Absolute: 0.3 10*3/uL (ref 0.0–0.5)
HCT: 33.8 % — ABNORMAL LOW (ref 34.8–46.6)
HGB: 10.4 g/dL — ABNORMAL LOW (ref 11.6–15.9)
LYMPH%: 82.9 % — ABNORMAL HIGH (ref 14.0–49.7)
MCHC: 30.8 g/dL — ABNORMAL LOW (ref 31.5–36.0)
MONO#: 0.9 10*3/uL (ref 0.1–0.9)
NEUT#: 4.6 10*3/uL (ref 1.5–6.5)
NEUT%: 13.2 % — ABNORMAL LOW (ref 38.4–76.8)
Platelets: 268 10*3/uL (ref 145–400)
WBC: 34.6 10*3/uL — ABNORMAL HIGH (ref 3.9–10.3)
lymph#: 28.7 10*3/uL — ABNORMAL HIGH (ref 0.9–3.3)

## 2011-08-29 LAB — COMPREHENSIVE METABOLIC PANEL
ALT: 16 U/L (ref 0–35)
CO2: 21 mEq/L (ref 19–32)
Calcium: 9.6 mg/dL (ref 8.4–10.5)
Chloride: 110 mEq/L (ref 96–112)
Creatinine, Ser: 1.92 mg/dL — ABNORMAL HIGH (ref 0.50–1.10)
Glucose, Bld: 138 mg/dL — ABNORMAL HIGH (ref 70–99)
Sodium: 140 mEq/L (ref 135–145)
Total Bilirubin: 0.3 mg/dL (ref 0.3–1.2)
Total Protein: 6.1 g/dL (ref 6.0–8.3)

## 2011-08-29 LAB — LACTATE DEHYDROGENASE: LDH: 184 U/L (ref 94–250)

## 2011-08-29 NOTE — Telephone Encounter (Signed)
appts made and printed for  Pt aom °

## 2011-08-29 NOTE — Progress Notes (Signed)
[facility]  [phone] [phone]   OFFICE PROGRESS NOTE   Cc:  Zigmund Gottron, MD   DIAGNOSIS: Chronic lymphocytic leukemia, stage 0, since 1996; now stage II; ZAP-70 neg; CD-38 neg; del 13q positive.   PAST TREATMENT: monthly chlorambucil on between Jan 2011 and Jan 2012.   CURRENT TREATMENT: active observation.  INTERVAL HISTORY: Christine Santos 66 y.o. female returns for regular follow up with her boyfriend.  She reports feeling relatively well.  She has mild fatigue; however, she is independent of all activities of daily living.  Her fatigue is much improved compared to when she was first diagnosed with CLL.  She has chronic bilateral pedal edema from PVD which has not worsened from baseline.   Patient denies fever, anorexia, weight loss, headache, visual changes, confusion, drenching night sweats, palpable lymph node swelling, mucositis, odynophagia, dysphagia, nausea vomiting, jaundice, chest pain, palpitation, shortness of breath, dyspnea on exertion, productive cough, gum bleeding, epistaxis, hematemesis, hemoptysis, abdominal pain, abdominal swelling, early satiety, melena, hematochezia, hematuria, skin rash, spontaneous bleeding, joint swelling, joint pain, heat or cold intolerance, bowel bladder incontinence, back pain, focal motor weakness, paresthesia, depression, suicidal or homicidal ideation, feeling hopelessness.   Past Medical History  Diagnosis Date  . CLL (chronic lymphoblastic leukemia)   . Hyperlipidemia   . Hypertension   . Anemia 08/29/2011  . Diastolic heart failure   . CAD (coronary artery disease)     s/p CABG in 1999  . Peripheral vascular disease     Past Surgical History  Procedure Date  . Cataract extraction   . Heel spur excision     Current Outpatient Prescriptions  Medication Sig Dispense Refill  . acetaminophen (TYLENOL) 500 MG tablet 500 mg. Take one or two prn up to 6 per day total       .  allopurinol (ZYLOPRIM) 300 MG tablet Take 0.5 tablets (150 mg total) by mouth daily.  45 tablet  3  . Ascorbic Acid (VITAMIN C) 100 MG tablet Take 100 mg by mouth daily.      Marland Kitchen aspirin 325 MG tablet Take 162.5 mg by mouth daily. Take 1/2 tab by mouth daily      . atorvastatin (LIPITOR) 20 MG tablet TAKE 1 TABLET BY MOUTH EVERY DAY  90 tablet  3  . Choline Fenofibrate (TRILIPIX) 45 MG capsule Take 45 mg by mouth daily. Per edmunds      . cloNIDine (CATAPRES - DOSED IN MG/24 HR) 0.2 mg/24hr patch Place 1 patch (0.2 mg total) onto the skin once a week.  12 patch  3  . clopidogrel (PLAVIX) 75 MG tablet TAKE 1 TABLET BY MOUTH DAILY  90 tablet  2  . diphenhydramine-acetaminophen (TYLENOL PM) 25-500 MG TABS Take 1 tablet by mouth at bedtime as needed.      . felodipine (PLENDIL) 10 MG 24 hr tablet Take 20 mg by mouth daily.        Marland Kitchen glucose blood (ACCU-CHEK AVIVA PLUS) test strip Test 3 times per day  100 strip  12  . GNP GARLIC EXTRACT PO Take 1 tablet by mouth daily.        . hydrochlorothiazide (HYDRODIURIL) 25 MG tablet TAKE 1 TABLET BY MOUTH EVERY DAY  90 tablet  3  . insulin glargine (LANTUS SOLOSTAR) 100 UNIT/ML injection Inject 27 Units into the skin daily.  15 mL  12  . Insulin Pen Needle (B-D UF III MINI PEN NEEDLES) 31G X 5 MM MISC  Disp needles to supply once daily injection. 1 box       . lisinopril (PRINIVIL,ZESTRIL) 40 MG tablet TAKE 1 TABLET BY MOUTH DAILY  90 tablet  2  . metoprolol tartrate (LOPRESSOR) 25 MG tablet TAKE 1/2 TABLET BY MOUTH TWICE DAILY  90 tablet  2  . Multiple Vitamins-Minerals (MULTIVITAMINS THER. W/MINERALS) TABS Take 1 tablet by mouth daily.        . Omega-3 Fatty Acids (FISH OIL) 1000 MG CAPS Take 2 capsules by mouth 2 (two) times daily.       Marland Kitchen OVER THE COUNTER MEDICATION Take 1 tablet by mouth daily. Macular Degenerative Eye Supplement       . pantoprazole (PROTONIX) 40 MG tablet Take 1 tablet (40 mg total) by mouth daily.  90 tablet  3  . PARoxetine (PAXIL) 20 MG  tablet Take 1 tablet (20 mg total) by mouth every morning.  90 tablet  3  . traMADol (ULTRAM) 50 MG tablet TAKE 1 TABLET BY MOUTH EVERY 6 HOURS AS NEEDED FOR PAIN  60 tablet  5  . DISCONTD: lisinopril (PRINIVIL,ZESTRIL) 40 MG tablet Take 40 mg by mouth daily.          ALLERGIES:  is allergic to amoxicillin; cephalexin; and codeine phosphate.  REVIEW OF SYSTEMS:  The rest of the 14-point review of system was negative.   Filed Vitals:   08/29/11 1352  BP: 161/60  Pulse: 18  Temp: 97.8 F (36.6 C)   Wt Readings from Last 3 Encounters:  08/29/11 188 lb 11.2 oz (85.594 kg)  04/13/11 182 lb (82.555 kg)  03/29/11 182 lb 11.2 oz (82.872 kg)   ECOG Performance status: 1  PHYSICAL EXAMINATION:   General:  well-nourished woman, in no acute distress.  Eyes:  no scleral icterus.  ENT:  There were no oropharyngeal lesions.  Neck was without thyromegaly.  Lymphatics:  Negative cervical, supraclavicular or axillary adenopathy.  Respiratory: lungs were clear bilaterally without wheezing or crackles.  Cardiovascular:  Regular rate and rhythm, S1/S2, without murmur, rub or gallop.  There was 1+ bilateral pedal edema.  GI:  abdomen was soft, flat, nontender, nondistended, without organomegaly.  Muscoloskeletal:  no spinal tenderness of palpation of vertebral spine.  Skin exam was without echymosis, petichae.  Neuro exam was nonfocal.  Patient was able to get on and off exam table without assistance.  Gait was normal.  Patient was alerted and oriented.  Attention was good.   Language was appropriate.  Mood was normal without depression.  Speech was not pressured.  Thought content was not tangential.     LABORATORY/RADIOLOGY DATA:  Lab Results  Component Value Date   WBC 34.6* 08/29/2011   HGB 10.4* 08/29/2011   HCT 33.8* 08/29/2011   PLT 268 08/29/2011   GLUCOSE 254* 05/30/2011   CHOL 128 04/13/2011   TRIG 135 04/13/2011   HDL 44 04/13/2011   LDLCALC 57 04/13/2011   ALKPHOS 84 02/28/2011   ALT 15 02/28/2011    AST 23 02/28/2011   NA 143 05/30/2011   K 5.0 05/30/2011   CL 108 05/30/2011   CREATININE 2.00* 05/30/2011   BUN 50* 05/30/2011   CO2 20 05/30/2011   INR 1.01 01/30/2009   HGBA1C 7.0 03/18/2011    ASSESSMENT AND PLAN:   1. CLL: I discussed with Ms. Kosman and her boyfriend that her CLL is stable. Despite slightly elevated WBC/lymphocytes, she does not have severe constitutional symptoms, recurrent infection, severe anemia or thrombocytopenia, bulky adenopathy.  Her anemia could be due to chronic renal insufficiency.  In other words, she does not have strict indication for reinitiating of chemo.  I again recommended watchful observation.   If she develops worsened anemia and renal function, we may consider restaging bone marrow biopsy.   2. Hypertension: She is on hydrochlorothiazide, lisinopril, metoprolol, clonidine. Her BP is still slightly elevated.  3. Diabetes mellitus: She is on insulin per primary care physician. 4. Chronic renal insufficiency: most likely due to her chronic HTN, DM. In the past, she had renal bx which showed CLL; however, at that time her leukocytosis was in the range of 50's. Today, her leukocytosis isn't as severe, and thus, I'm more inclined to attribute her CKD to HTN, DM as opposed to rapid progression of CLL requiring treatment.  5. Hyperlipidemia: She is on fish oil, and atorvastatin per primary care physician. 6. Peripheral vascular disease: She is on aspirin and Plavix per cardiologist. She had stent in 2012.  7. Follow up: Lab in 3 months. RV with me in 6 months.  8.     The length of time of the face-to-face encounter was 15  minutes. More than 50% of time was spent counseling and coordination of care.

## 2011-09-19 ENCOUNTER — Telehealth: Payer: Self-pay | Admitting: Family Medicine

## 2011-09-19 NOTE — Telephone Encounter (Signed)
Rt foot pain, like previous heel spur.  Keep appointment two days from now.  Advised on use of massage in the morning before walking.

## 2011-09-19 NOTE — Telephone Encounter (Signed)
Please call patient back to advise what she can do for her foot until her appt on Wednesday with Dr. Andria Frames.

## 2011-09-21 ENCOUNTER — Ambulatory Visit
Admission: RE | Admit: 2011-09-21 | Discharge: 2011-09-21 | Disposition: A | Discharge status: Home / Self Care | Payer: Medicare Other | Source: Ambulatory Visit | Attending: Family Medicine | Admitting: Family Medicine

## 2011-09-21 ENCOUNTER — Encounter: Payer: Self-pay | Admitting: Family Medicine

## 2011-09-21 ENCOUNTER — Ambulatory Visit (INDEPENDENT_AMBULATORY_CARE_PROVIDER_SITE_OTHER): Payer: Medicare Other | Admitting: Family Medicine

## 2011-09-21 VITALS — BP 140/66 | HR 57 | Temp 98.1°F | Ht 65.5 in | Wt 183.0 lb

## 2011-09-21 DIAGNOSIS — M79673 Pain in unspecified foot: Secondary | ICD-10-CM

## 2011-09-21 DIAGNOSIS — M79609 Pain in unspecified limb: Secondary | ICD-10-CM

## 2011-09-21 DIAGNOSIS — E1165 Type 2 diabetes mellitus with hyperglycemia: Secondary | ICD-10-CM

## 2011-09-21 DIAGNOSIS — E118 Type 2 diabetes mellitus with unspecified complications: Secondary | ICD-10-CM

## 2011-09-21 DIAGNOSIS — Z23 Encounter for immunization: Secondary | ICD-10-CM

## 2011-09-21 MED ORDER — PNEUMOCOCCAL VAC POLYVALENT 25 MCG/0.5ML IJ INJ: 0.5000 mL | INJECTION | Freq: Once | INTRAMUSCULAR | Status: DC

## 2011-09-21 MED ORDER — INSULIN GLARGINE 100 UNIT/ML ~~LOC~~ SOLN: 30.0000 [IU] | Freq: Every day | SUBCUTANEOUS | Status: DC

## 2011-09-21 NOTE — Patient Instructions (Addendum)
I will call you with the x ray results.  You certainly have the plantar fasciitis again.  I want to know if you broke anything. Remember to increase you insulin to 30 units per day. As soon as your foot allows, get more active.

## 2011-09-21 NOTE — Assessment & Plan Note (Signed)
A1c not at goal.  Probably a combo of wt being up, less exercise and progression of disease.  Will increase lantus, advise to exercise and FU in 3 months.

## 2011-09-21 NOTE — Progress Notes (Signed)
  Subjective:    Patient ID: Christine Santos, female    DOB: October 27, 1945, 66 y.o.   MRN: [id number]  HPI Right foot pain.  History of plantar fasciitis.  One month ago dropped can of food on foot and pain ever since.  Seems to be getting worse.  Pain on both dorsal and plantar aspect.  Diabetes seems fine.  Recent eye exam.  Not as physically active due to pain.    Review of Systems     Objective:   Physical Exam  Pain in two distinct places in right foot. Pain at metatarsal, tarsal junction on dorsum - where can landed on foot. Pain in the plantar aspect from metatarsal heads to the heel - most exquisite at the insertion of the plantar fascia.        Assessment & Plan:

## 2011-09-21 NOTE — Assessment & Plan Note (Addendum)
Likely both contusion, R/O fracture and plantar fasciitis.  Restart stretching and massage exercises and check x ray.  Atypical story for gout.

## 2011-09-22 ENCOUNTER — Other Ambulatory Visit: Payer: Self-pay | Admitting: Family Medicine

## 2011-09-22 DIAGNOSIS — M79673 Pain in unspecified foot: Secondary | ICD-10-CM

## 2011-09-22 DIAGNOSIS — M8580 Other specified disorders of bone density and structure, unspecified site: Secondary | ICD-10-CM | POA: Insufficient documentation

## 2011-09-22 NOTE — Assessment & Plan Note (Signed)
Given Xray results.  Told to call in two weeks.  If no improvement with exercises, will refer to sports med clinic.

## 2011-09-22 NOTE — Assessment & Plan Note (Signed)
Seen on foot x ray.  Will check dexa scan

## 2011-09-27 ENCOUNTER — Other Ambulatory Visit: Payer: Self-pay | Admitting: Family Medicine

## 2011-09-28 ENCOUNTER — Telehealth: Payer: Self-pay | Admitting: *Deleted

## 2011-09-28 NOTE — Telephone Encounter (Signed)
appt made for pt to have Dexa scan on September 9.2013 @ 1015 AM @ [facility] [address], Phone: [phone]. Pt informed.Audelia Hives [place]

## 2011-09-29 NOTE — Telephone Encounter (Signed)
Pt was informed of appt .Audelia Hives [place]

## 2011-10-03 ENCOUNTER — Ambulatory Visit
Admission: RE | Admit: 2011-10-03 | Discharge: 2011-10-03 | Disposition: A | Discharge status: Home / Self Care | Payer: Medicare Other | Source: Ambulatory Visit | Attending: Family Medicine | Admitting: Family Medicine

## 2011-10-03 DIAGNOSIS — M8580 Other specified disorders of bone density and structure, unspecified site: Secondary | ICD-10-CM

## 2011-10-19 ENCOUNTER — Ambulatory Visit (INDEPENDENT_AMBULATORY_CARE_PROVIDER_SITE_OTHER): Payer: Medicare Other | Admitting: *Deleted

## 2011-10-19 ENCOUNTER — Telehealth: Payer: Self-pay | Admitting: Family Medicine

## 2011-10-19 DIAGNOSIS — Z23 Encounter for immunization: Secondary | ICD-10-CM

## 2011-10-19 NOTE — Telephone Encounter (Signed)
Given handwritten Rx

## 2011-10-19 NOTE — Telephone Encounter (Signed)
Forwarded to pcp.Piera Downs Lyn[place]  

## 2011-10-19 NOTE — Telephone Encounter (Signed)
Pt is needing script for her diabetic shoes - she is coming in for flu shot this morning and wants to pick up then

## 2011-11-10 ENCOUNTER — Telehealth: Payer: Self-pay | Admitting: Family Medicine

## 2011-11-10 NOTE — Telephone Encounter (Signed)
Forwarded to pcp for follow up.Christine Santos

## 2011-11-10 NOTE — Telephone Encounter (Signed)
Patient needs rx for diabetic shoes.  She says the insurance is also requiring a letter verifying that she is a diabetic so that the shoes will be covered.

## 2011-11-11 ENCOUNTER — Encounter: Payer: Self-pay | Admitting: *Deleted

## 2011-11-11 NOTE — Telephone Encounter (Signed)
OK to provide. 

## 2011-11-11 NOTE — Telephone Encounter (Signed)
Letter and rx placed up front pt informed.Christine Santos

## 2011-11-29 ENCOUNTER — Other Ambulatory Visit (HOSPITAL_BASED_OUTPATIENT_CLINIC_OR_DEPARTMENT_OTHER): Payer: Medicare Other | Admitting: Lab

## 2011-11-29 DIAGNOSIS — D649 Anemia, unspecified: Secondary | ICD-10-CM

## 2011-11-29 DIAGNOSIS — C9110 Chronic lymphocytic leukemia of B-cell type not having achieved remission: Secondary | ICD-10-CM

## 2011-11-29 DIAGNOSIS — N183 Chronic kidney disease, stage 3 unspecified: Secondary | ICD-10-CM

## 2011-11-29 LAB — CBC WITH DIFFERENTIAL/PLATELET
BASO%: 0.1 % (ref 0.0–2.0)
Eosinophils Absolute: 0.2 10*3/uL (ref 0.0–0.5)
LYMPH%: 86.7 % — ABNORMAL HIGH (ref 14.0–49.7)
MCHC: 31.4 g/dL — ABNORMAL LOW (ref 31.5–36.0)
MCV: 98 fL (ref 79.5–101.0)
MONO%: 2.4 % (ref 0.0–14.0)
NEUT#: 4.8 10*3/uL (ref 1.5–6.5)
RBC: 3.51 10*6/uL — ABNORMAL LOW (ref 3.70–5.45)
RDW: 16.2 % — ABNORMAL HIGH (ref 11.2–14.5)
WBC: 45.8 10*3/uL — ABNORMAL HIGH (ref 3.9–10.3)

## 2011-11-29 LAB — COMPREHENSIVE METABOLIC PANEL (CC13)
ALT: 17 U/L (ref 0–55)
AST: 17 U/L (ref 5–34)
Albumin: 3.7 g/dL (ref 3.5–5.0)
Alkaline Phosphatase: 100 U/L (ref 40–150)
Glucose: 168 mg/dl — ABNORMAL HIGH (ref 70–99)
Potassium: 5.3 mEq/L — ABNORMAL HIGH (ref 3.5–5.1)
Sodium: 142 mEq/L (ref 136–145)
Total Bilirubin: 0.38 mg/dL (ref 0.20–1.20)
Total Protein: 6.2 g/dL — ABNORMAL LOW (ref 6.4–8.3)

## 2011-12-16 ENCOUNTER — Telehealth: Payer: Self-pay

## 2011-12-16 NOTE — Telephone Encounter (Signed)
Message copied by Azzie Glatter on Fri Dec 16, 2011  3:57 PM ------      Message from: Sherryl Manges T      Created: Tue Nov 29, 2011  1:15 PM       Please call pt.  Her WBC has slightly increased from CLL.  However, her Hgb and Platelet are stable. I again recommended observation.  Thanks.

## 2012-01-31 ENCOUNTER — Other Ambulatory Visit: Payer: Self-pay | Admitting: Family Medicine

## 2012-02-10 ENCOUNTER — Other Ambulatory Visit: Payer: Self-pay | Admitting: Family Medicine

## 2012-02-10 DIAGNOSIS — Z1231 Encounter for screening mammogram for malignant neoplasm of breast: Secondary | ICD-10-CM

## 2012-02-13 ENCOUNTER — Other Ambulatory Visit: Payer: Self-pay | Admitting: Family Medicine

## 2012-02-14 MED ORDER — INSULIN PEN NEEDLE 31G X 5 MM MISC: Status: DC

## 2012-02-29 ENCOUNTER — Telehealth: Payer: Self-pay | Admitting: Oncology

## 2012-02-29 ENCOUNTER — Ambulatory Visit (HOSPITAL_BASED_OUTPATIENT_CLINIC_OR_DEPARTMENT_OTHER): Payer: Medicare Other | Admitting: Oncology

## 2012-02-29 ENCOUNTER — Encounter: Payer: Self-pay | Admitting: Oncology

## 2012-02-29 ENCOUNTER — Other Ambulatory Visit (HOSPITAL_BASED_OUTPATIENT_CLINIC_OR_DEPARTMENT_OTHER): Payer: Medicare Other | Admitting: Lab

## 2012-02-29 VITALS — BP 156/76 | HR 74 | Temp 97.4°F | Resp 20 | Ht 65.5 in | Wt 187.2 lb

## 2012-02-29 DIAGNOSIS — D649 Anemia, unspecified: Secondary | ICD-10-CM

## 2012-02-29 DIAGNOSIS — I10 Essential (primary) hypertension: Secondary | ICD-10-CM

## 2012-02-29 DIAGNOSIS — N183 Chronic kidney disease, stage 3 unspecified: Secondary | ICD-10-CM

## 2012-02-29 DIAGNOSIS — E119 Type 2 diabetes mellitus without complications: Secondary | ICD-10-CM

## 2012-02-29 DIAGNOSIS — C9110 Chronic lymphocytic leukemia of B-cell type not having achieved remission: Secondary | ICD-10-CM

## 2012-02-29 DIAGNOSIS — N189 Chronic kidney disease, unspecified: Secondary | ICD-10-CM

## 2012-02-29 LAB — CBC WITH DIFFERENTIAL/PLATELET
EOS%: 0.7 % (ref 0.0–7.0)
LYMPH%: 84.2 % — ABNORMAL HIGH (ref 14.0–49.7)
MCH: 28.1 pg (ref 25.1–34.0)
MCHC: 29.5 g/dL — ABNORMAL LOW (ref 31.5–36.0)
MCV: 95.2 fL (ref 79.5–101.0)
MONO%: 2.3 % (ref 0.0–14.0)
NEUT#: 5.8 10*3/uL (ref 1.5–6.5)
Platelets: 277 10*3/uL (ref 145–400)
RBC: 3.55 10*6/uL — ABNORMAL LOW (ref 3.70–5.45)
RDW: 16.7 % — ABNORMAL HIGH (ref 11.2–14.5)

## 2012-02-29 LAB — COMPREHENSIVE METABOLIC PANEL (CC13)
ALT: 16 U/L (ref 0–55)
AST: 20 U/L (ref 5–34)
Albumin: 3.4 g/dL — ABNORMAL LOW (ref 3.5–5.0)
Alkaline Phosphatase: 110 U/L (ref 40–150)
BUN: 33.2 mg/dL — ABNORMAL HIGH (ref 7.0–26.0)
CO2: 21 meq/L — ABNORMAL LOW (ref 22–29)
Calcium: 9.3 mg/dL (ref 8.4–10.4)
Chloride: 109 meq/L — ABNORMAL HIGH (ref 98–107)
Creatinine: 1.9 mg/dL — ABNORMAL HIGH (ref 0.6–1.1)
Glucose: 155 mg/dL — ABNORMAL HIGH (ref 70–99)
Potassium: 4.9 meq/L (ref 3.5–5.1)
Sodium: 142 meq/L (ref 136–145)
Total Bilirubin: 0.28 mg/dL (ref 0.20–1.20)
Total Protein: 6.4 g/dL (ref 6.4–8.3)

## 2012-02-29 NOTE — Progress Notes (Signed)
[facility]  Telephone:([phone] Fax:(336) [phone]   OFFICE PROGRESS NOTE   Cc:  Zigmund Gottron, MD   DIAGNOSIS: Chronic lymphocytic leukemia, stage 0, since 1996; now stage II; ZAP-70 neg; CD-38 neg; del 13q positive.   PAST TREATMENT: monthly chlorambucil on between Jan 2011 and Jan 2012.   CURRENT TREATMENT: active observation.  INTERVAL HISTORY: Christine Santos 67 y.o. female returns for regular follow up with her boyfriend.  She reports feeling relatively well.  She has mild fatigue; however, she is independent of all activities of daily living.  Her fatigue is much improved compared to when she was first diagnosed with CLL.  She has chronic bilateral pedal edema from PVD which has not worsened from baseline.   Patient denies fever, anorexia, weight loss, headache, visual changes, confusion, drenching night sweats, palpable lymph node swelling, mucositis, odynophagia, dysphagia, nausea vomiting, jaundice, chest pain, palpitation, shortness of breath, dyspnea on exertion, productive cough, gum bleeding, epistaxis, hematemesis, hemoptysis, abdominal pain, abdominal swelling, early satiety, melena, hematochezia, hematuria, skin rash, spontaneous bleeding, joint swelling, joint pain, heat or cold intolerance, bowel bladder incontinence, back pain, focal motor weakness, paresthesia, depression, suicidal or homicidal ideation, feeling hopelessness.   Past Medical History  Diagnosis Date  . CLL (chronic lymphoblastic leukemia)   . Hyperlipidemia   . Hypertension   . Anemia 08/29/2011  . Diastolic heart failure   . CAD (coronary artery disease)     s/p CABG in 1999  . Peripheral vascular disease     Past Surgical History  Procedure Date  . Cataract extraction   . Heel spur excision     Current Outpatient Prescriptions  Medication Sig Dispense Refill  . ACCU-CHEK FASTCLIX LANCETS MISC USE AS DIRECTED 3 TIMES A DAY FOR BLOOD SUGAR  100 each  PRN  .  acetaminophen (TYLENOL) 500 MG tablet 500 mg. Take one or two prn up to 6 per day total       . allopurinol (ZYLOPRIM) 300 MG tablet Take 0.5 tablets (150 mg total) by mouth daily.  45 tablet  3  . Ascorbic Acid (VITAMIN C) 100 MG tablet Take 100 mg by mouth daily.      Marland Kitchen aspirin 325 MG tablet Take 162.5 mg by mouth daily. Take 1/2 tab by mouth daily      . atorvastatin (LIPITOR) 20 MG tablet TAKE 1 TABLET BY MOUTH EVERY DAY  90 tablet  3  . Choline Fenofibrate (TRILIPIX) 45 MG capsule Take 45 mg by mouth daily. Per edmunds      . cloNIDine (CATAPRES - DOSED IN MG/24 HR) 0.2 mg/24hr patch Place 1 patch (0.2 mg total) onto the skin once a week.  12 patch  3  . clopidogrel (PLAVIX) 75 MG tablet TAKE 1 TABLET BY MOUTH DAILY  90 tablet  2  . diphenhydramine-acetaminophen (TYLENOL PM) 25-500 MG TABS Take 1 tablet by mouth at bedtime as needed.      . felodipine (PLENDIL) 10 MG 24 hr tablet Take 20 mg by mouth daily.        Marland Kitchen glucose blood (ACCU-CHEK AVIVA PLUS) test strip Test 3 times per day  100 strip  12  . GNP GARLIC EXTRACT PO Take 1 tablet by mouth daily.        . hydrochlorothiazide (HYDRODIURIL) 25 MG tablet TAKE 1 TABLET BY MOUTH EVERY DAY  90 tablet  3  . insulin glargine (LANTUS SOLOSTAR) 100 UNIT/ML injection Inject 30 Units into the skin daily.  15 mL  12  . Insulin Pen Needle (B-D UF III MINI PEN NEEDLES) 31G X 5 MM MISC Disp needles to supply once daily injection. 1 box  100 each  prn  . lisinopril (PRINIVIL,ZESTRIL) 40 MG tablet TAKE 1 TABLET BY MOUTH DAILY  90 tablet  0  . metoprolol tartrate (LOPRESSOR) 25 MG tablet TAKE 1/2 TABLET BY MOUTH TWICE DAILY  90 tablet  0  . Multiple Vitamins-Minerals (MULTIVITAMINS THER. W/MINERALS) TABS Take 1 tablet by mouth daily.        . Omega-3 Fatty Acids (FISH OIL) 1000 MG CAPS Take 2 capsules by mouth 2 (two) times daily.       Marland Kitchen OVER THE COUNTER MEDICATION Take 1 tablet by mouth daily. Macular Degenerative Eye Supplement       . pantoprazole  (PROTONIX) 40 MG tablet Take 1 tablet (40 mg total) by mouth daily.  90 tablet  3  . PARoxetine (PAXIL) 20 MG tablet TAKE 1 TABLET BY MOUTH EVERY MORNING  90 tablet  2  . traMADol (ULTRAM) 50 MG tablet TAKE 1 TABLET BY MOUTH EVERY 6 HOURS AS NEEDED FOR PAIN  60 tablet  5   Current Facility-Administered Medications  Medication Dose Route Frequency Provider Last Rate Last Dose  . pneumococcal 23 valent vaccine (PNU-IMMUNE) injection 0.5 mL  0.5 mL Intramuscular Once Zigmund Gottron, MD        ALLERGIES:  is allergic to amoxicillin; cephalexin; and codeine phosphate.  REVIEW OF SYSTEMS:  The rest of the 14-point review of system was negative.   Filed Vitals:   02/29/12 1357  BP: 156/76  Pulse: 74  Temp: 97.4 F (36.3 C)  Resp: 20   Wt Readings from Last 3 Encounters:  02/29/12 187 lb 3.2 oz (84.913 kg)  09/21/11 183 lb (83.008 kg)  08/29/11 188 lb 11.2 oz (85.594 kg)   ECOG Performance status: 1  PHYSICAL EXAMINATION:   General:  well-nourished woman, in no acute distress.  Eyes:  no scleral icterus.  ENT:  There were no oropharyngeal lesions.  Neck was without thyromegaly.  Lymphatics:  Negative cervical, supraclavicular or axillary adenopathy.  Respiratory: lungs were clear bilaterally without wheezing or crackles.  Cardiovascular:  Regular rate and rhythm, S1/S2, without murmur, rub or gallop.  There was 1+ bilateral pedal edema.  GI:  abdomen was soft, flat, nontender, nondistended, without organomegaly.  Muscoloskeletal:  no spinal tenderness of palpation of vertebral spine.  Skin exam was without echymosis, petichae.  Neuro exam was nonfocal.  Patient was able to get on and off exam table without assistance.  Gait was normal.  Patient was alerted and oriented.  Attention was good.   Language was appropriate.  Mood was normal without depression.  Speech was not pressured.  Thought content was not tangential.     LABORATORY/RADIOLOGY DATA:  Lab Results  Component Value Date    WBC 46.6* 02/29/2012   HGB 10.0* 02/29/2012   HCT 33.7* 02/29/2012   PLT 277 02/29/2012   GLUCOSE 155* 02/29/2012   CHOL 128 04/13/2011   TRIG 135 04/13/2011   HDL 44 04/13/2011   LDLCALC 57 04/13/2011   ALKPHOS 110 02/29/2012   ALT 16 02/29/2012   AST 20 02/29/2012   NA 142 02/29/2012   K 4.9 02/29/2012   CL 109* 02/29/2012   CREATININE 1.9* 02/29/2012   BUN 33.2* 02/29/2012   CO2 21* 02/29/2012   INR 1.01 01/30/2009   HGBA1C 8.1 09/21/2011    ASSESSMENT AND  PLAN:   1. CLL: I discussed with Christine Santos and her boyfriend that her CLL is stable. Despite slightly elevated WBC/lymphocytes, she does not have severe constitutional symptoms, recurrent infection, severe anemia or thrombocytopenia, bulky adenopathy.  Her anemia could be due to chronic renal insufficiency.  In other words, she does not have strict indication for reinitiating of chemo.  I again recommended watchful observation.   If she develops worsened anemia and renal function, we may consider restaging bone marrow biopsy.   2. Hypertension: She is on hydrochlorothiazide, lisinopril, metoprolol, clonidine. Her BP is still slightly elevated.  3. Diabetes mellitus: She is on insulin per primary care physician. 4. Chronic renal insufficiency: most likely due to her chronic HTN, DM. In the past, she had renal bx which showed CLL; however, at that time her leukocytosis was in the range of 50's. Today, her leukocytosis isn't as severe, and thus, I'm more inclined to attribute her CKD to HTN, DM as opposed to rapid progression of CLL requiring treatment.  5. Hyperlipidemia: She is on fish oil, and atorvastatin per primary care physician. 6. Peripheral vascular disease: She is on aspirin and Plavix per cardiologist. She had stent in 2012.  7. Follow up: Lab in 3 months. RV in 6 months.     The length of time of the face-to-face encounter was 15  minutes. More than 50% of time was spent counseling and coordination of care.

## 2012-02-29 NOTE — Telephone Encounter (Signed)
gv and printed appt schedule for pt for May and Aug

## 2012-03-05 ENCOUNTER — Other Ambulatory Visit: Payer: Self-pay | Admitting: Family Medicine

## 2012-03-08 ENCOUNTER — Ambulatory Visit: Payer: Medicare Other

## 2012-03-19 ENCOUNTER — Ambulatory Visit
Admission: RE | Admit: 2012-03-19 | Discharge: 2012-03-19 | Disposition: A | Discharge status: Home / Self Care | Payer: Medicare Other | Source: Ambulatory Visit | Attending: Family Medicine | Admitting: Family Medicine

## 2012-03-19 DIAGNOSIS — Z1231 Encounter for screening mammogram for malignant neoplasm of breast: Secondary | ICD-10-CM

## 2012-03-20 ENCOUNTER — Encounter: Payer: Self-pay | Admitting: Family Medicine

## 2012-03-20 DIAGNOSIS — E11319 Type 2 diabetes mellitus with unspecified diabetic retinopathy without macular edema: Secondary | ICD-10-CM | POA: Insufficient documentation

## 2012-04-02 ENCOUNTER — Encounter: Payer: Self-pay | Admitting: Home Health Services

## 2012-04-04 ENCOUNTER — Other Ambulatory Visit: Payer: Self-pay | Admitting: *Deleted

## 2012-04-04 DIAGNOSIS — I10 Essential (primary) hypertension: Secondary | ICD-10-CM

## 2012-04-05 MED ORDER — CLONIDINE HCL 0.2 MG/24HR TD PTWK: 1.0000 | MEDICATED_PATCH | TRANSDERMAL | Status: DC

## 2012-04-30 ENCOUNTER — Other Ambulatory Visit: Payer: Self-pay | Admitting: Family Medicine

## 2012-05-09 ENCOUNTER — Ambulatory Visit (INDEPENDENT_AMBULATORY_CARE_PROVIDER_SITE_OTHER): Payer: Medicare Other | Admitting: Family Medicine

## 2012-05-09 VITALS — BP 147/46 | HR 62 | Temp 98.8°F | Ht 65.5 in | Wt 185.0 lb

## 2012-05-09 DIAGNOSIS — E119 Type 2 diabetes mellitus without complications: Secondary | ICD-10-CM

## 2012-05-09 DIAGNOSIS — E118 Type 2 diabetes mellitus with unspecified complications: Secondary | ICD-10-CM

## 2012-05-09 DIAGNOSIS — I10 Essential (primary) hypertension: Secondary | ICD-10-CM

## 2012-05-09 DIAGNOSIS — N183 Chronic kidney disease, stage 3 unspecified: Secondary | ICD-10-CM

## 2012-05-09 DIAGNOSIS — E1165 Type 2 diabetes mellitus with hyperglycemia: Secondary | ICD-10-CM

## 2012-05-09 LAB — POCT GLYCOSYLATED HEMOGLOBIN (HGB A1C): Hemoglobin A1C: 7.3

## 2012-05-10 ENCOUNTER — Encounter: Payer: Self-pay | Admitting: Family Medicine

## 2012-05-10 NOTE — Assessment & Plan Note (Signed)
Recent labs stable.  

## 2012-05-10 NOTE — Progress Notes (Signed)
  Subjective:    Patient ID: Christine Santos, female    DOB: [birth year], 67 y.o.   MRN: [id number]  HPI Recheck of problems.  Followed by heme for CLL.  I follow DM and HBP.  Stay advised about CAD.  Feeling well.  Not exercising much.  Wt is OK but would like improved wt loss.   DM BS at home are low 100s BP has been fine No SOB, CP or ankle swelling    Review of Systems     Objective:   Physical Exam  Lungs clear Cardiac RRR without m or g No edema      Assessment & Plan:

## 2012-05-10 NOTE — Assessment & Plan Note (Signed)
OK control.  Will push lifestyle.

## 2012-05-10 NOTE — Assessment & Plan Note (Signed)
Recent labs are stable.

## 2012-05-24 ENCOUNTER — Other Ambulatory Visit: Payer: Self-pay | Admitting: Family Medicine

## 2012-05-28 ENCOUNTER — Other Ambulatory Visit (HOSPITAL_BASED_OUTPATIENT_CLINIC_OR_DEPARTMENT_OTHER): Payer: Medicare Other

## 2012-05-28 DIAGNOSIS — C9110 Chronic lymphocytic leukemia of B-cell type not having achieved remission: Secondary | ICD-10-CM

## 2012-05-28 LAB — COMPREHENSIVE METABOLIC PANEL (CC13)
CO2: 20 mEq/L — ABNORMAL LOW (ref 22–29)
Glucose: 202 mg/dl — ABNORMAL HIGH (ref 70–99)
Sodium: 142 mEq/L (ref 136–145)
Total Bilirubin: 0.39 mg/dL (ref 0.20–1.20)
Total Protein: 6.4 g/dL (ref 6.4–8.3)

## 2012-05-28 LAB — CBC WITH DIFFERENTIAL/PLATELET
BASO%: 0.1 % (ref 0.0–2.0)
EOS%: 0.6 % (ref 0.0–7.0)
MCH: 29.7 pg (ref 25.1–34.0)
MCHC: 30.8 g/dL — ABNORMAL LOW (ref 31.5–36.0)
MCV: 96.1 fL (ref 79.5–101.0)
MONO%: 2.8 % (ref 0.0–14.0)
RDW: 16.4 % — ABNORMAL HIGH (ref 11.2–14.5)
lymph#: 43.6 10*3/uL — ABNORMAL HIGH (ref 0.9–3.3)

## 2012-06-06 ENCOUNTER — Telehealth: Payer: Self-pay | Admitting: *Deleted

## 2012-06-06 NOTE — Telephone Encounter (Signed)
Pt left VM asking about her lab results from 05/28/12.

## 2012-06-08 ENCOUNTER — Telehealth: Payer: Self-pay | Admitting: Oncology

## 2012-06-08 ENCOUNTER — Telehealth: Payer: Self-pay | Admitting: *Deleted

## 2012-06-08 ENCOUNTER — Other Ambulatory Visit: Payer: Self-pay | Admitting: Oncology

## 2012-06-08 DIAGNOSIS — C9110 Chronic lymphocytic leukemia of B-cell type not having achieved remission: Secondary | ICD-10-CM

## 2012-06-08 NOTE — Telephone Encounter (Signed)
Informed pt of increase in WBC and Cr (not major) per Dr. Lamonte Sakai.  Informed of next visit moved up to 07/30/12 at 1:30 pm to see Dr. Lamonte Sakai before he leaves the practice.  She verbalized understanding.

## 2012-06-08 NOTE — Telephone Encounter (Signed)
Please tell patient that her WBC and Cr have slightly increased (not major).  Since I'm leaving soon, I'll move up her RV with me from 08/2012 to early July 2014.  I'll place a POF.  Thankd.

## 2012-07-04 ENCOUNTER — Encounter: Payer: Self-pay | Admitting: Family Medicine

## 2012-07-04 DIAGNOSIS — H3530 Unspecified macular degeneration: Secondary | ICD-10-CM | POA: Insufficient documentation

## 2012-07-30 ENCOUNTER — Other Ambulatory Visit (HOSPITAL_BASED_OUTPATIENT_CLINIC_OR_DEPARTMENT_OTHER): Payer: Medicare Other | Admitting: Lab

## 2012-07-30 ENCOUNTER — Ambulatory Visit (HOSPITAL_BASED_OUTPATIENT_CLINIC_OR_DEPARTMENT_OTHER): Payer: Medicare Other | Admitting: Oncology

## 2012-07-30 ENCOUNTER — Telehealth: Payer: Self-pay | Admitting: Oncology

## 2012-07-30 VITALS — BP 150/64 | HR 69 | Temp 97.9°F | Resp 18 | Ht 65.0 in | Wt 185.0 lb

## 2012-07-30 DIAGNOSIS — C9110 Chronic lymphocytic leukemia of B-cell type not having achieved remission: Secondary | ICD-10-CM

## 2012-07-30 LAB — CBC WITH DIFFERENTIAL/PLATELET
BASO%: 0.4 % (ref 0.0–2.0)
EOS%: 0.4 % (ref 0.0–7.0)
LYMPH%: 86.6 % — ABNORMAL HIGH (ref 14.0–49.7)
MCH: 30.8 pg (ref 25.1–34.0)
MCHC: 31.9 g/dL (ref 31.5–36.0)
MCV: 96.6 fL (ref 79.5–101.0)
MONO#: 0.7 10*3/uL (ref 0.1–0.9)
MONO%: 1.3 % (ref 0.0–14.0)
NEUT%: 11.3 % — ABNORMAL LOW (ref 38.4–76.8)
Platelets: 290 10*3/uL (ref 145–400)
RBC: 3.46 10*6/uL — ABNORMAL LOW (ref 3.70–5.45)
WBC: 56 10*3/uL (ref 3.9–10.3)

## 2012-07-30 LAB — COMPREHENSIVE METABOLIC PANEL (CC13)
ALT: 21 U/L (ref 0–55)
AST: 23 U/L (ref 5–34)
BUN: 54 mg/dL — ABNORMAL HIGH (ref 7.0–26.0)
Creatinine: 2.2 mg/dL — ABNORMAL HIGH (ref 0.6–1.1)
Total Bilirubin: 0.41 mg/dL (ref 0.20–1.20)

## 2012-07-30 LAB — LACTATE DEHYDROGENASE (CC13): LDH: 265 U/L — ABNORMAL HIGH (ref 125–245)

## 2012-07-30 NOTE — Telephone Encounter (Signed)
Gave pt appt for lab and MD on September 2014

## 2012-07-30 NOTE — Patient Instructions (Signed)
1.  History of CLL 2.  Status:  Now with increasing white blood cell count and worsening renal function. 3.  Issue:  Is worsening renal function part of progression of CLL or is it something else? 4.  Since there are sign of disease progression with more fatigue, we may consider starting a new chemo Ibrutinib tablet.  The chance of response can be as high as 80%.  It has been proven to be very effective in patients with relapsed CLL.  Potential side effects include but not limited to low blood count, bleeding, infection, slight nausea/vomiting.

## 2012-08-01 NOTE — Progress Notes (Signed)
[facility]  Telephone:(336) [phone] Fax:(336) [phone]   OFFICE PROGRESS NOTE   Cc:  Zigmund Gottron, MD   DIAGNOSIS: Chronic lymphocytic leukemia, stage 0, since 1996; now stage II; ZAP-70 neg; CD-38 neg; del 13q positive.   PAST TREATMENT: monthly chlorambucil on between Jan 2011 and Jan 2012.   CURRENT TREATMENT: active observation.  INTERVAL HISTORY: Christine Santos 67 y.o. female returns for regular follow up with her husband and two sons.  She reports feeling a little bit more tired compared to last year.  However, her fatigue level is still no where compared to when she first presented to me in 2011.  [place]  She still smokes cigarettes and is not ready to stop smoking despite my encouragement to stop today.  She reports normal appetite and denies weight loss. She denied vomiting, diarrhea.   Patient denies fever, anorexia, weight loss, headache, visual changes, confusion, drenching night sweats, palpable lymph node swelling, mucositis, odynophagia, dysphagia, nausea vomiting, jaundice, chest pain, palpitation, shortness of breath, dyspnea on exertion, productive cough, gum bleeding, epistaxis, hematemesis, hemoptysis, abdominal pain, abdominal swelling, early satiety, melena, hematochezia, hematuria, skin rash, spontaneous bleeding, joint swelling, joint pain, heat or cold intolerance, bowel bladder incontinence, back pain, focal motor weakness, paresthesia, depression.    Past Medical History  Diagnosis Date  . CLL (chronic lymphoblastic leukemia)   . Hyperlipidemia   . Hypertension   . Anemia 08/29/2011  . Diastolic heart failure   . CAD (coronary artery disease)     s/p CABG in 1999  . Peripheral vascular disease     Past Surgical History  Procedure Laterality Date  . Cataract extraction    . Heel spur excision      Current Outpatient Prescriptions  Medication Sig Dispense Refill  . ACCU-CHEK FASTCLIX  LANCETS MISC USE AS DIRECTED 3 TIMES A DAY FOR BLOOD SUGAR  100 each  PRN  . acetaminophen (TYLENOL) 500 MG tablet 500 mg. Take one or two prn up to 6 per day total       . allopurinol (ZYLOPRIM) 300 MG tablet TAKE 1/2 TABLET BY MOUTH EVERY DAY  45 tablet  3  . Ascorbic Acid (VITAMIN C) 100 MG tablet Take 100 mg by mouth daily.      Marland Kitchen aspirin 325 MG tablet Take 162.5 mg by mouth daily. Take 1/2 tab by mouth daily      . atorvastatin (LIPITOR) 20 MG tablet TAKE 1 TABLET BY MOUTH EVERY DAY  90 tablet  3  . Choline Fenofibrate (TRILIPIX) 45 MG capsule Take 45 mg by mouth daily. Per edmunds      . cloNIDine (CATAPRES - DOSED IN MG/24 HR) 0.2 mg/24hr patch Place 1 patch (0.2 mg total) onto the skin once a week.  12 patch  3  . clopidogrel (PLAVIX) 75 MG tablet TAKE 1 TABLET BY MOUTH DAILY  90 tablet  3  . diphenhydramine-acetaminophen (TYLENOL PM) 25-500 MG TABS Take 1 tablet by mouth at bedtime as needed.      . felodipine (PLENDIL) 10 MG 24 hr tablet Take 20 mg by mouth daily.        Marland Kitchen glucose blood (ACCU-CHEK AVIVA PLUS) test strip Test 3 times per day  100 strip  12  . GNP GARLIC EXTRACT PO Take 1 tablet by mouth daily.        . hydrochlorothiazide (HYDRODIURIL) 25 MG tablet TAKE 1 TABLET BY MOUTH EVERY DAY  90 tablet  3  . Insulin Pen Needle (B-D UF III MINI PEN NEEDLES) 31G X 5 MM MISC Disp needles to supply once daily injection. 1 box  100 each  prn  . LANTUS SOLOSTAR 100 UNIT/ML injection INJECT 27 UNITS INTO THE SKIN DAILY  15 mL  12  . lisinopril (PRINIVIL,ZESTRIL) 40 MG tablet TAKE 1 TABLET BY MOUTH DAILY  90 tablet  3  . metoprolol tartrate (LOPRESSOR) 25 MG tablet TAKE 1/2 TABLET BY MOUTH TWICE DAILY  90 tablet  3  . Multiple Vitamins-Minerals (MULTIVITAMINS THER. W/MINERALS) TABS Take 1 tablet by mouth daily.        . Omega-3 Fatty Acids (FISH OIL) 1000 MG CAPS Take 2 capsules by mouth 2 (two) times daily.       Marland Kitchen OVER THE COUNTER MEDICATION Take 1 tablet by mouth daily. Macular  Degenerative Eye Supplement       . pantoprazole (PROTONIX) 40 MG tablet TAKE 1 TABLET BY MOUTH DAILY  90 tablet  3  . PARoxetine (PAXIL) 20 MG tablet TAKE 1 TABLET BY MOUTH EVERY MORNING  90 tablet  2  . traMADol (ULTRAM) 50 MG tablet TAKE 1 TABLET BY MOUTH EVERY 6 HOURS AS NEEDED FOR PAIN  60 tablet  5   Current Facility-Administered Medications  Medication Dose Route Frequency Provider Last Rate Last Dose  . pneumococcal 23 valent vaccine (PNU-IMMUNE) injection 0.5 mL  0.5 mL Intramuscular Once Zigmund Gottron, MD        ALLERGIES:  is allergic to amoxicillin; cephalexin; and codeine phosphate.  REVIEW OF SYSTEMS:  The rest of the 14-point review of system was negative.   Filed Vitals:   07/30/12 1344  BP: 150/64  Pulse: 69  Temp: 97.9 F (36.6 C)  Resp: 18   Wt Readings from Last 3 Encounters:  07/30/12 185 lb (83.915 kg)  05/09/12 185 lb (83.915 kg)  02/29/12 187 lb 3.2 oz (84.913 kg)   ECOG Performance status: 1  PHYSICAL EXAMINATION:   General:  well-nourished woman, in no acute distress.  Eyes:  no scleral icterus.  ENT:  There were no oropharyngeal lesions.  Neck was without thyromegaly.  Lymphatics:  Negative cervical, supraclavicular or axillary adenopathy.  Respiratory: lungs were clear bilaterally without wheezing or crackles.  Cardiovascular:  Regular rate and rhythm, S1/S2, without murmur, rub or gallop.  There was 1+ bilateral pedal edema.  GI:  abdomen was soft, flat, nontender, nondistended, without organomegaly.  Muscoloskeletal:  no spinal tenderness of palpation of vertebral spine.  Skin exam was without echymosis, petichae.  Neuro exam was nonfocal.  Patient was able to get on and off exam table without assistance.  Gait was normal.  Patient was alert and oriented.  Attention was good.   Language was appropriate.  Mood was normal without depression.  Speech was not pressured.  Thought content was not tangential.     LABORATORY/RADIOLOGY DATA:  Lab  Results  Component Value Date   WBC 56.0* 07/30/2012   HGB 10.7* 07/30/2012   HCT 33.4* 07/30/2012   PLT 290 07/30/2012   GLUCOSE 142* 07/30/2012   CHOL 128 04/13/2011   TRIG 135 04/13/2011   HDL 44 04/13/2011   LDLCALC 57 04/13/2011   ALKPHOS 88 07/30/2012   ALT 21 07/30/2012   AST 23 07/30/2012   NA 140 07/30/2012   K 5.6* 07/30/2012   CL 110* 05/28/2012   CREATININE 2.2* 07/30/2012   BUN 54.0* 07/30/2012   CO2 22 07/30/2012  INR 1.01 01/30/2009   HGBA1C 7.3 05/09/2012    ASSESSMENT AND PLAN:   1. CLL: She has increasing B symptoms and lymphocytosis.  Her Cr was increasing before but is now stable. We discussed option of proceeding with salvage chemo (such as Ibrutinib) now or wait until more B symptoms and renal insufficiency.  She preferred to wait two more months. Since this disease is not curable; timing to start salvage chemo is flexible and I do not see absolute indication to resume chemo now if she is not ready.   2. Hypertension: She is on hydrochlorothiazide, lisinopril, metoprolol, clonidine.  3. Diabetes mellitus: She is on insulin per primary care physician. 4. Chronic renal insufficiency: most likely due to her chronic HTN, DM. Her Cr today is better than last visit. In the past, she had renal bx which showed CLL (result in [place]). In the future, if there is concern how to attribute worsening renal function either to HTN, DM or progression of CLL, then a renal biopsy maybe considered.   5. Hyperlipidemia: She is on fish oil, and atorvastatin per primary care physician. 6. Peripheral vascular disease: She is on aspirin and Plavix per cardiologist. She had stent in 2012.  7. Follow up:  Return visit in 2 months.    I informed Ms. Gunnison and her relatives that I am leaving the practice.  [facility] will arrange for her to see another provider when she returns.   The length of time of the face-to-face encounter was 25  minutes. More than 50% of time was spent counseling and coordination of  care.

## 2012-08-27 ENCOUNTER — Ambulatory Visit: Payer: Medicare Other | Admitting: Oncology

## 2012-08-27 ENCOUNTER — Other Ambulatory Visit: Payer: Medicare Other | Admitting: Lab

## 2012-09-02 ENCOUNTER — Other Ambulatory Visit: Payer: Self-pay | Admitting: Family Medicine

## 2012-09-07 ENCOUNTER — Encounter: Payer: Self-pay | Admitting: Family Medicine

## 2012-09-07 ENCOUNTER — Ambulatory Visit (INDEPENDENT_AMBULATORY_CARE_PROVIDER_SITE_OTHER): Payer: Medicare Other | Admitting: Family Medicine

## 2012-09-07 VITALS — BP 109/52 | HR 57 | Temp 98.0°F | Ht 65.5 in | Wt 184.0 lb

## 2012-09-07 DIAGNOSIS — E1165 Type 2 diabetes mellitus with hyperglycemia: Secondary | ICD-10-CM

## 2012-09-07 DIAGNOSIS — IMO0002 Reserved for concepts with insufficient information to code with codable children: Secondary | ICD-10-CM

## 2012-09-07 DIAGNOSIS — C9110 Chronic lymphocytic leukemia of B-cell type not having achieved remission: Secondary | ICD-10-CM

## 2012-09-07 DIAGNOSIS — E118 Type 2 diabetes mellitus with unspecified complications: Secondary | ICD-10-CM

## 2012-09-07 DIAGNOSIS — M159 Polyosteoarthritis, unspecified: Secondary | ICD-10-CM

## 2012-09-07 DIAGNOSIS — I10 Essential (primary) hypertension: Secondary | ICD-10-CM

## 2012-09-07 DIAGNOSIS — N183 Chronic kidney disease, stage 3 unspecified: Secondary | ICD-10-CM

## 2012-09-07 LAB — POCT GLYCOSYLATED HEMOGLOBIN (HGB A1C): Hemoglobin A1C: 7.1

## 2012-09-07 MED ORDER — TRAMADOL HCL 50 MG PO TABS: 50.0000 mg | ORAL_TABLET | Freq: Four times a day (QID) | ORAL | Status: DC

## 2012-09-07 NOTE — Assessment & Plan Note (Signed)
Good control

## 2012-09-07 NOTE — Patient Instructions (Addendum)
You can take tylenol as needed.  No more than 2 of the 500 mg tabs at one time.  And no more than six tabs in any one day. I will increase your tramadol so that you can take four per day.

## 2012-09-07 NOTE — Assessment & Plan Note (Signed)
Followed by heme onc.  WBC creeping up, now 56K

## 2012-09-07 NOTE — Assessment & Plan Note (Signed)
Her major complaint is of pain.

## 2012-09-07 NOTE — Progress Notes (Signed)
  Subjective:    Patient ID: Christine Santos, female    DOB: [birth year], 67 y.o.   MRN: [id number]  HPI Doing generally well.  No major complaints.  Does have numb feet.  Has been seen by heme onc and to be seen by cards.  They have done blood work.  Reviewed.   DM, A1C is 7.1, not quite at goal - but home blood sugars over the last month are outstanding.  Will not change meds. BP is good.    Review of Systems     Objective:   Physical Exam Lungs clear Cardiac RRR without m or g Abd benign       Assessment & Plan:

## 2012-09-07 NOTE — Assessment & Plan Note (Signed)
It looks like her baseline is now 2.0 - 2.2.  Reviewed last several years and has a slow trend up.

## 2012-09-21 ENCOUNTER — Other Ambulatory Visit: Payer: Self-pay | Admitting: Family Medicine

## 2012-10-19 ENCOUNTER — Other Ambulatory Visit: Payer: Self-pay | Admitting: Internal Medicine

## 2012-10-19 DIAGNOSIS — C9110 Chronic lymphocytic leukemia of B-cell type not having achieved remission: Secondary | ICD-10-CM

## 2012-10-23 ENCOUNTER — Ambulatory Visit (HOSPITAL_BASED_OUTPATIENT_CLINIC_OR_DEPARTMENT_OTHER): Payer: Medicare Other | Admitting: Internal Medicine

## 2012-10-23 ENCOUNTER — Encounter: Payer: Self-pay | Admitting: Internal Medicine

## 2012-10-23 ENCOUNTER — Telehealth: Payer: Self-pay | Admitting: Internal Medicine

## 2012-10-23 ENCOUNTER — Other Ambulatory Visit (HOSPITAL_BASED_OUTPATIENT_CLINIC_OR_DEPARTMENT_OTHER): Payer: Medicare Other | Admitting: Lab

## 2012-10-23 VITALS — BP 187/71 | HR 93 | Temp 98.0°F | Resp 18 | Ht 65.5 in | Wt 185.2 lb

## 2012-10-23 DIAGNOSIS — N183 Chronic kidney disease, stage 3 unspecified: Secondary | ICD-10-CM

## 2012-10-23 DIAGNOSIS — I10 Essential (primary) hypertension: Secondary | ICD-10-CM

## 2012-10-23 DIAGNOSIS — D649 Anemia, unspecified: Secondary | ICD-10-CM

## 2012-10-23 DIAGNOSIS — C9110 Chronic lymphocytic leukemia of B-cell type not having achieved remission: Secondary | ICD-10-CM

## 2012-10-23 LAB — CBC WITH DIFFERENTIAL/PLATELET
BASO%: 0.3 % (ref 0.0–2.0)
Basophils Absolute: 0.1 10*3/uL (ref 0.0–0.1)
EOS%: 0.6 % (ref 0.0–7.0)
Eosinophils Absolute: 0.3 10*3/uL (ref 0.0–0.5)
HCT: 34.7 % — ABNORMAL LOW (ref 34.8–46.6)
LYMPH%: 87.2 % — ABNORMAL HIGH (ref 14.0–49.7)
MCH: 29.6 pg (ref 25.1–34.0)
MCHC: 30.7 g/dL — ABNORMAL LOW (ref 31.5–36.0)
MONO#: 1.2 10*3/uL — ABNORMAL HIGH (ref 0.1–0.9)
NEUT#: 4.7 10*3/uL (ref 1.5–6.5)
NEUT%: 9.5 % — ABNORMAL LOW (ref 38.4–76.8)
Platelets: 280 10*3/uL (ref 145–400)
lymph#: 43.4 10*3/uL — ABNORMAL HIGH (ref 0.9–3.3)

## 2012-10-23 LAB — COMPREHENSIVE METABOLIC PANEL (CC13)
ALT: 15 U/L (ref 0–55)
BUN: 44.6 mg/dL — ABNORMAL HIGH (ref 7.0–26.0)
CO2: 22 mEq/L (ref 22–29)
Creatinine: 1.8 mg/dL — ABNORMAL HIGH (ref 0.6–1.1)
Potassium: 5 mEq/L (ref 3.5–5.1)
Total Bilirubin: 0.35 mg/dL (ref 0.20–1.20)

## 2012-10-23 LAB — TECHNOLOGIST REVIEW

## 2012-10-23 NOTE — Telephone Encounter (Signed)
Gave pt apt for lab and MD on January 2014, lab on December 2014

## 2012-10-23 NOTE — Patient Instructions (Signed)
Brand Names: [place]  Imbruvica What is this drug used for?  .It is used to treat a type of lymphoma.  .It is used to treat a type of leukemia. What do I need to tell my doctor BEFORE I take this drug?  .If you have an allergy to ibrutinib or any other part of this drug.  .If you are allergic to any drugs like this one, any other drugs, foods, or other substances. Tell your doctor about the allergy and what signs you had, like rash; hives; itching; shortness of breath; wheezing; cough; swelling of face, lips, tongue, or throat; or any other signs.  .If you take any drugs (prescription or OTC, natural products, vitamins) that must not be taken with this drug, like certain drugs that are used for HIV, infections, or seizures. There are many drugs that must not be taken with this drug.  This is not a list of all drugs or health problems that interact with this drug.  Tell your doctor and pharmacist about all of your drugs (prescription or OTC, natural products, vitamins) and health problems. You must check to make sure that it is safe for you to take this drug with all of your drugs and health problems. Do not start, stop, or change the dose of any drug without checking with your doctor. What are some things I need to know or do while I take this drug?  .Tell dentists, surgeons, and other doctors that you use this drug.  Marland KitchenAvoid driving and doing other tasks or actions that call for you to be alert or have clear eyesight until you see how this drug affects you.  .You may have more chance of getting an infection. Wash hands often. Stay away from people with infections, colds, or flu.  .This drug may raise the chance of very bad and sometimes deadly infections. Talk with the doctor.  .You may bleed more easily. Be careful and avoid injury. Use a soft toothbrush and an Copy.  .Talk with your doctor if you have had recent surgery or plan to have surgery.  .This drug may add to the chance of  getting some types of cancer. Talk with the doctor.  .If you have upset stomach, throwing up, loose stools (diarrhea), or are not hungry, talk with your doctor. There may be ways to lower these side effects.  .Have your blood work checked often. Talk with your doctor.  Marland KitchenAvoid grapefruit and grapefruit juice.  Marland KitchenAvoid [place] oranges.  .Do not take St John's wort with this drug. This drug may not work as well.  .Use care if you have risks for heart disease (high blood pressure, high cholesterol, overweight, high blood sugar [diabetes], cigarette smoking, man older than 68 years of age, other family members with early heart disease, woman after change of life). Talk with your doctor.  .If you are 68 or older, use this drug with care. You could have more side effects.  .This drug may cause harm to the unborn baby if you take it while you are pregnant.  .Use birth control that you can trust to prevent pregnancy while taking this drug.  .Tell your doctor if you are pregnant or plan on getting pregnant. You will need to talk about the benefits and risks of using this drug while you are pregnant.  .Tell your doctor if you are breast-feeding. You will need to talk about any risks to your baby. What are some side effects that I need to call my  doctor about right away?  WARNING/CAUTION: Even though it may be rare, some people may have very bad and sometimes deadly side effects when taking a drug. Tell your doctor or get medical help right away if you have any of the following signs or symptoms that may be related to a very bad side effect:  .Signs of an allergic reaction, like rash; hives; itching; red, swollen, blistered, or peeling skin with or without fever; wheezing; tightness in the chest or throat; trouble breathing or talking; unusual hoarseness; or swelling of the mouth, face, lips, tongue, or throat.  .Signs of infection. These include a fever of 100.24F (38C) or higher, chills, very bad sore throat,  ear or sinus pain, cough, more sputum or change in color of sputum, pain with passing urine, mouth sores, wound that will not heal, or anal itching or pain.  .Signs of bleeding like throwing up blood or throw up that looks like coffee grounds; coughing up blood; blood in the urine; black, red, or tarry stools; bleeding from the gums; vaginal bleeding that is not normal; bruises without a reason or that get bigger; or any bleeding that is very bad or that you cannot stop.  Marland KitchenShortness of breath, a big weight gain, swelling in the arms or legs.  .A burning, numbness, or tingling feeling that is not normal.  .Dizziness.  .Chest pain or pressure or passing out.  .A heartbeat that does not feel normal.  .Very upset stomach or throwing up.  .Very bad belly pain.  .Very loose stools (diarrhea).  .Feeling very tired or weak.  .Dry mouth.  .Dry skin.  .Mouth sores.  .Very bad headache.  .Very bad and sometimes deadly kidney problems have happened with this drug. Call your doctor right away if you are unable to pass urine or if you have blood in the urine or a change in the amount of urine passed. What are some other side effects of this drug?  All drugs may cause side effects. However, many people have no side effects or only have minor side effects. Call your doctor or get medical help if any of these side effects or any other side effects bother you or do not go away:  .Headache.  .Feeling tired or weak.  Marland KitchenAnxiety.  .Not hungry.  Marland KitchenUpset stomach or throwing up.  .Belly pain.  .Loose stools (diarrhea).  .Hard stools (constipation).  .Stuffy nose.  .Muscle or joint pain.  .Muscle spasm.  .Not able to sleep.  These are not all of the side effects that may occur. If you have questions about side effects, call your doctor. Call your doctor for medical advice about side effects.  You may report side effects to your national health agency. How is this drug best taken?  Use this drug as ordered by  your doctor. Read and follow the dosing on the label closely.  .Take as you have been told, even if you feel well.  .To gain the most benefit, do not miss doses.  .Take this drug at the same time of day.  .Take with or without food.  .Take with a full glass of water.  .Swallow whole. Do not chew, break, or crush.  .Drink lots of noncaffeine liquids unless told to drink less liquid by your doctor. What do I do if I miss a dose?  Marland KitchenTake a missed dose as soon as you think about it.  .If it is close to the time for your next dose, skip the missed dose and  go back to your normal time.  .Do not take 2 doses at the same time or extra doses. How do I store and/or throw out this drug?  .Store in the original container at room temperature.  .Store in a dry place. Do not store in a bathroom.  Marland KitchenKeep all drugs out of the reach of children and pets.  .Check with your pharmacist about how to throw out unused drugs.  General drug facts  .If your symptoms or health problems do not get better or if they become worse, call your doctor.  .Do not share your drugs with others and do not take anyone else's drugs.  Marland KitchenKeep a list of all your drugs (prescription, natural products, vitamins, OTC) with you. Give this list to your doctor.  .Talk with the doctor before starting any new drug, including prescription or OTC, natural products, or vitamins.  .Some drugs may have another patient information leaflet. If you have any questions about this drug, please talk with your doctor, pharmacist, or other health care provider.  .If you think there has been an overdose, call your poison control center or get medical care right away. Be ready to tell or show what was taken, how much, and when it happened.  Chronic Lymphocytic Leukemia Chronic lymphocytic leukemia (CLL) is a type of cancer of the bone marrow and blood cells. Bone marrow is the soft, spongy tissue inside your bone. In CLL, the bone marrow makes too many white  blood cells that usually fight infection in the body (lymphocytes). CLL is the most common type of adult leukemia.  CAUSES  No one knows the exact cause of CLL. There is a higher risk of CLL in people who:   Are over 58 years of age.  Are white.  Are female.  Have a family history of CLL or other cancers of the lymph system.  Are of [place] or [place] descent.  Have been exposed to certain chemicals, such as Agent Orange (used in the [place] War) or other herbicides or insecticides. SYMPTOMS  At first, some people do not have symptoms of chronic lymphocytic leukemia. After a while, people may notice some symptoms, such as:   Feeling more tired than usual, even after rest.  Unplanned weight loss.  Heavy sweating at night.  Fevers.  Shortness of breath.  Decreased energy.  Paleness.  Painless, swollen lymph nodes.  A feeling of fullness in the upper left part of the abdomen.  Easy bruising and/or bleeding.  More frequent infections. DIAGNOSIS   During a physical exam, your caregiver may notice an enlarged spleen, liver and/or lymph nodes.  Blood and bone marrow tests are performed to identify the presence of cancer cells.  A CT scan may be done to look for swelling or abnormalities in your spleen, liver, and lymph nodes. TREATMENT  Treatment options for CLL depend on the stage and the presence of symptoms. There are a number of types of treatment used for this condition, including:  Targeted drugs. These are drugs that interfere with chemicals that leukemia cells need in order to grow and multiply.  Chemotherapy drugs. These medications kill cells that are multiplying quickly, such as leukemia cells.  Biological therapy. This treatment boosts the ability of the patient's own immune system to fight the leukemia cells.  Bone marrow or peripheral blood stem cell transplant. This treatment allows the patient to receive very high doses of  chemotherapy and/or radiation. These high doses kill the cancer cells, but  also destroy the bone marrow. After treatment is complete, the patient is given donor bone marrow or stem cells, which will replace the bone marrow. HOME CARE INSTRUCTIONS   Because you have an increased risk of infection, practice good hand washing and avoid being around people who are ill or crowded places.  Because you have an increased risk of bleeding and bruising, avoid contact sports or other rough activities.  Only take over-the-counter or prescription medicines for pain, discomfort or fever as directed by your caregiver.  Although some of your treatments might affect your appetite, try to eat regular, healthy meals.  If you develop any side effects, such as nausea, diarrhea, rash, white patches in your mouth, a sore throat, difficulty swallowing, or severe fatigue, tell your caregiver. He or she may have recommendations of things you can do to improve symptoms.  Consider learning some ways to cope with the stress of having a chronic illness, such as yoga, meditation, or participating in a support group. SEEK IMMEDIATE MEDICAL CARE IF:  You develop an unexplained oral temperature of 102 F (38.9 C) or more.  You develop chest pains.  You develop a severe stiff neck or headache.  You have trouble breathing or feel short of breath.  You feel very lightheaded or pass out.  You notice pain, swelling or redness anywhere in your legs.  You have pain in your belly (abdomen).  You develop new bruises that are getting bigger.  You have painful or more swollen lymph nodes.  You develop bleeding from your gums, nose, or in your urine or stools.  You are unable to stop throwing up (vomiting).  You cannot keep liquids down.  You feel depressed. Document Released: 05/29/2008 Document Revised: 04/04/2011 Document Reviewed: 05/29/2008 [facility] Patient Information 2014 [place], [place].

## 2012-10-24 NOTE — Progress Notes (Signed)
[place], [place] [address]  DIAGNOSIS: CLL - Plan: CBC with Differential, Comprehensive metabolic panel, CBC with Differential in 2 months  Anemia  Chronic kidney disease, stage III (moderate)  Chief Complaint  Patient presents with  . CLL    CURRENT THERAPY:  Active observation.  Past therapy: Monthly chlorabucil [place] January 2011 in January 2012  INTERVAL HISTORY: Christine Santos 67 y.o. 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 He was last seen by Dr. Lamonte Sakai on 07/30/2012. Today she is accompanied by her significant other Mr. [name] and her sister [name]. She reports that her fatigue has improved significantly since her last visit. She reports a normal appetite and denies weight loss. She denies vomiting, diarrhea. She denies fever, anorexia, weight loss, headache, visual changes, confusion, drenching night sweats, palpable lymph node swelling, mucositis, or odynophagia, dysphagia, nausea, jaundice, chest pain, palpitations, dyspnea, dyspnea on exertion, productive cough, bleeding, offices, abdominal pain, early satiety, melena, hematochezia, hematuria, weakness. He reports having her colonoscopy done last month. She states that she was anxious to see who her provider would be for today's visit. She has received the pneumococcal vaccination.  MEDICAL HISTORY: Past Medical History  Diagnosis Date  . CLL (chronic lymphoblastic leukemia)   . Hyperlipidemia   . Hypertension   . Anemia 08/29/2011  . Diastolic heart failure   . CAD (coronary artery disease)     s/p CABG in 1999  . Peripheral vascular disease     INTERIM HISTORY: has CLL; DIABETES MELLITUS, II, COMPLICATIONS; HYPERCHOLESTEROLEMIA; OBESITY, NOS; HYPERTENSION, BENIGN SYSTEMIC; CORONARY, ARTERIOSCLEROSIS; SINUS  BRADYCARDIA; Chronic systolic heart failure; PERIPHERAL VASCULAR DISEASE; REFLUX ESOPHAGITIS; IRRITABLE BOWEL SYNDROME; Chronic kidney disease, stage III (moderate); CYSTOCELE/RECTOCELE/PROLAPSE,UNSPEC.; Osteoarthritis, multiple sites; Anxiety; Stress incontinence, female; Gout; Anemia; Foot pain; Osteopenia; Diabetic retinopathy; and Macular degeneration on her problem list.    ALLERGIES:  is allergic to amoxicillin; cephalexin; and codeine phosphate.  MEDICATIONS: has a current medication list which includes the following prescription(s): accu-chek fastclix lancets, acetaminophen, allopurinol, vitamin c, aspirin, atorvastatin, choline fenofibrate, clonidine, clopidogrel, diphenhydramine-acetaminophen, felodipine, glucose blood, garlic, hydrochlorothiazide, insulin glargine, insulin pen needle, lisinopril, metoprolol tartrate, multivitamins ther. w/minerals, fish oil, OVER THE COUNTER MEDICATION, pantoprazole, paroxetine, and tramadol, and the following Facility-Administered Medications: pneumococcal 23 valent vaccine.  SURGICAL HISTORY:  Past Surgical History  Procedure Laterality Date  . Cataract extraction    . Heel spur excision      REVIEW OF SYSTEMS:   Constitutional: Denies fevers, chills or abnormal weight loss Eyes: Denies blurriness of vision Ears, nose, mouth, throat, and face: Denies mucositis or sore throat Respiratory: Denies cough, dyspnea or wheezes Cardiovascular: Denies palpitation, chest discomfort or lower extremity swelling Gastrointestinal:  Denies nausea, heartburn or change in bowel habits Skin: Denies abnormal skin rashes Lymphatics: Denies new lymphadenopathy or easy bruising Neurological:Denies numbness, tingling or new weaknesses Behavioral/Psych: Mood is stable, no new changes  All other systems were reviewed with the patient and are negative.  PHYSICAL EXAMINATION: ECOG PERFORMANCE STATUS: 1 - Symptomatic but completely ambulatory  Blood pressure 187/71,  pulse 93, temperature 98 F (36.7 C), temperature source Oral, resp. rate 18, height 5' 5.5" (1.664 m), weight 185 lb 3.2 oz (84.006 kg), SpO2 99.00%.  GENERAL:alert, no distress and comfortable SKIN: skin color, texture, turgor are normal, no rashes or significant lesions EYES: normal,  Conjunctiva are pink and non-injected, sclera clear OROPHARYNX:no exudate, no erythema and lips, buccal mucosa, and tongue normal  NECK: supple, thyroid normal size, non-tender, without nodularity LYMPH:  no palpable lymphadenopathy in the cervical, axillary or supraclavicular LUNGS: clear to auscultation and percussion with normal breathing effort HEART: regular rate & rhythm and no murmurs and trace extremity edema ABDOMEN:abdomen soft, non-tender and normal bowel sounds Musculoskeletal:no cyanosis of digits and no clubbing  NEURO: alert & oriented x 3 with fluent speech, no focal motor/sensory deficits   LABORATORY DATA: Results for orders placed in visit on 10/23/12 (from the past 48 hour(s))  CBC WITH DIFFERENTIAL     Status: Abnormal   Collection Time    10/23/12 12:37 PM      Result Value Range   WBC 49.7 (*) 3.9 - 10.3 10e3/uL   NEUT# 4.7  1.5 - 6.5 10e3/uL   HGB 10.7 (*) 11.6 - 15.9 g/dL   HCT 34.7 (*) 34.8 - 46.6 %   Platelets 280  145 - 400 10e3/uL   MCV 96.4  79.5 - 101.0 fL   MCH 29.6  25.1 - 34.0 pg   MCHC 30.7 (*) 31.5 - 36.0 g/dL   RBC 3.60 (*) 3.70 - 5.45 10e6/uL   RDW 15.8 (*) 11.2 - 14.5 %   lymph# 43.4 (*) 0.9 - 3.3 10e3/uL   MONO# 1.2 (*) 0.1 - 0.9 10e3/uL   Eosinophils Absolute 0.3  0.0 - 0.5 10e3/uL   Basophils Absolute 0.1  0.0 - 0.1 10e3/uL   NEUT% 9.5 (*) 38.4 - 76.8 %   LYMPH% 87.2 (*) 14.0 - 49.7 %   MONO% 2.4  0.0 - 14.0 %   EOS% 0.6  0.0 - 7.0 %   BASO% 0.3  0.0 - 2.0 %  TECHNOLOGIST REVIEW     Status: None   Collection Time    10/23/12 12:37 PM      Result Value Range   Technologist Review Variant lymphs present, few smudge cells    COMPREHENSIVE METABOLIC  PANEL (0000000)     Status: Abnormal   Collection Time    10/23/12 12:37 PM      Result Value Range   Sodium 143  136 - 145 mEq/L   Potassium 5.0  3.5 - 5.1 mEq/L   Chloride 110 (*) 98 - 109 mEq/L   CO2 22  22 - 29 mEq/L   Glucose 179 (*) 70 - 140 mg/dl   BUN 44.6 (*) 7.0 - 26.0 mg/dL   Creatinine 1.8 (*) 0.6 - 1.1 mg/dL   Total Bilirubin 0.35  0.20 - 1.20 mg/dL   Alkaline Phosphatase 108  40 - 150 U/L   AST 19  5 - 34 U/L   ALT 15  0 - 55 U/L   Total Protein 6.1 (*) 6.4 - 8.3 g/dL   Albumin 3.5  3.5 - 5.0 g/dL   Calcium 9.0  8.4 - 10.4 mg/dL       Labs:  Lab Results  Component Value Date   WBC 49.7* 10/23/2012   HGB 10.7* 10/23/2012   HCT 34.7* 10/23/2012   MCV 96.4 10/23/2012   PLT 280 10/23/2012   NEUTROABS 4.7 10/23/2012      Chemistry      Component Value Date/Time   NA 143 10/23/2012 1237   NA 140 08/29/2011 1335   K 5.0 10/23/2012 1237   K 5.2 08/29/2011 1335   CL 110* 05/28/2012 1259   CL 110 08/29/2011 1335  CO2 22 10/23/2012 1237   CO2 21 08/29/2011 1335   BUN 44.6* 10/23/2012 1237   BUN 47* 08/29/2011 1335   CREATININE 1.8* 10/23/2012 1237   CREATININE 1.92* 08/29/2011 1335   CREATININE 1.63* 09/08/2010 0851      Component Value Date/Time   CALCIUM 9.0 10/23/2012 1237   CALCIUM 9.6 08/29/2011 1335   ALKPHOS 108 10/23/2012 1237   ALKPHOS 79 08/29/2011 1335   AST 19 10/23/2012 1237   AST 16 08/29/2011 1335   ALT 15 10/23/2012 1237   ALT 16 08/29/2011 1335   BILITOT 0.35 10/23/2012 1237   BILITOT 0.3 08/29/2011 1335      RADIOGRAPHIC STUDIES: No results found.  ASSESSMENT: ADALIYAH CHOUDRY 67 y.o. female with a history of CLL - Plan: CBC with Differential, Comprehensive metabolic panel, CBC with Differential in 2 months  Anemia  Chronic kidney disease, stage III (moderate)   PLAN:  #1 CLL. Her B. symptoms and lymphocytosis s appear to be stable. Her creatinine has decreased since her last visit. On the prior visit, Dr. Heber McVille discussed with the patient the option of proceeding  with salvage chemotherapy with ibrutinib then or waiting to see if she became more symptomatic and/or kidney function decreased. That time she preferred to wait. As there is no absolute indication to resume chemotherapy, we continued observation. Moreover,  given the results of today's lab that shows all improvement in her creatinine and a stable lymphocytosis, the patient agrees to continue active observation. She was provided a handout on CLL that includes symptoms to monitor.  #2 hypertension.  Today her blood pressure was elevated.  She is on hydrochlorothiazide, lisinopril, metoprolol, clonidine patch.  We we advised the patient to continue to check her blood pressure in the setting of her home and to also contact her primary care physician should her blood pressure consistently over140/90. She will continue her low-salt diet.   #3 diabetes mellitus.  Her HbA1c is improved to 7.1.ShHe is on insulin per her primary care physician.  #4 chronic renal insufficiency likely secondary to #2, #3.  Her creatinine today is better (1.8 down from 1.92 last visit).  In the past, she had a renal biopsy which showed CLL (result in moles he asked). In the future, if there is concern for how to attribute worsening renal function either 2 hypertension, diabetes or progression of CLL, and a renal biopsy may be considered.  #5 hyperlipidemia. She is on fish wall and a torus that her primary care physician.  #6 peripheral vascular disease. She is on aspirin and Plavix per cardiologist she had a stent placed in 2012   #7 followup. Patient instructed to followup in 2 months at that time we will check a CBC, CMP. We will also check a CBC in one month.  All questions were answered. The patient knows to call the clinic with any problems, questions or concerns. We can certainly see the patient much sooner if necessary.  Patient was provided and after visit summary.  I spent 15 minutes counseling the patient face to face.  The total time spent in the appointment was 25 minutes.    Rudra Hobbins, MD 10/24/2012 1:21 PM

## 2012-10-30 ENCOUNTER — Other Ambulatory Visit: Payer: Self-pay | Admitting: Interventional Cardiology

## 2012-10-30 ENCOUNTER — Ambulatory Visit (INDEPENDENT_AMBULATORY_CARE_PROVIDER_SITE_OTHER): Payer: Medicare Other | Admitting: *Deleted

## 2012-10-30 DIAGNOSIS — Z23 Encounter for immunization: Secondary | ICD-10-CM

## 2012-11-12 ENCOUNTER — Other Ambulatory Visit: Payer: Self-pay | Admitting: Family Medicine

## 2012-11-27 ENCOUNTER — Telehealth: Payer: Self-pay

## 2012-11-27 NOTE — Telephone Encounter (Signed)
Closed in error.

## 2012-11-27 NOTE — Telephone Encounter (Signed)
Patient will need RX for diabetic shoes and foot report. Will pick up while she is here for her appt on Friday 11/7.

## 2012-11-30 ENCOUNTER — Ambulatory Visit (INDEPENDENT_AMBULATORY_CARE_PROVIDER_SITE_OTHER): Payer: Medicare Other | Admitting: Family Medicine

## 2012-11-30 ENCOUNTER — Encounter: Payer: Self-pay | Admitting: Family Medicine

## 2012-11-30 VITALS — BP 134/51 | HR 58 | Temp 97.8°F | Ht 65.0 in | Wt 185.0 lb

## 2012-11-30 DIAGNOSIS — E1142 Type 2 diabetes mellitus with diabetic polyneuropathy: Secondary | ICD-10-CM

## 2012-11-30 DIAGNOSIS — E1149 Type 2 diabetes mellitus with other diabetic neurological complication: Secondary | ICD-10-CM

## 2012-11-30 DIAGNOSIS — E1165 Type 2 diabetes mellitus with hyperglycemia: Secondary | ICD-10-CM

## 2012-11-30 DIAGNOSIS — E1140 Type 2 diabetes mellitus with diabetic neuropathy, unspecified: Secondary | ICD-10-CM

## 2012-11-30 MED ORDER — INSULIN GLARGINE 100 UNIT/ML SOLOSTAR PEN: 35.0000 [IU] | PEN_INJECTOR | Freq: Every day | SUBCUTANEOUS | Status: DC

## 2012-11-30 NOTE — Progress Notes (Signed)
  Subjective:    Patient ID: Christine Santos, female    DOB: [birth year], 67 y.o.   MRN: [id number]  HPI Doing well.  Wants an rx for diabetic shoes - same as last year.  She has neuropathy and PVD.  Blood sugars running higher.  HgbA1C higher.  Wt unchanged.   Review of Systems     Objective:   Physical Exam  Lungs clear Cardiac RRR without m or g      Assessment & Plan:

## 2012-11-30 NOTE — Patient Instructions (Signed)
Everything is going well except your diabetes.  Your A1C is up to 7.5 Increase your lantus insulin to 35 units daily. See me in three months.  Sooner if your blood sugars are running high.

## 2012-11-30 NOTE — Assessment & Plan Note (Signed)
Worsening control.  Increase insulin.

## 2012-11-30 NOTE — Assessment & Plan Note (Signed)
Hand written Rx for diabetic shoes.

## 2012-12-10 ENCOUNTER — Other Ambulatory Visit: Payer: Self-pay | Admitting: Obstetrics and Gynecology

## 2012-12-24 ENCOUNTER — Other Ambulatory Visit (HOSPITAL_BASED_OUTPATIENT_CLINIC_OR_DEPARTMENT_OTHER): Payer: Medicare Other | Admitting: Lab

## 2012-12-24 ENCOUNTER — Other Ambulatory Visit: Payer: Self-pay | Admitting: Family Medicine

## 2012-12-24 ENCOUNTER — Ambulatory Visit: Payer: Medicare Other

## 2012-12-24 ENCOUNTER — Telehealth: Payer: Self-pay | Admitting: Internal Medicine

## 2012-12-24 DIAGNOSIS — C9110 Chronic lymphocytic leukemia of B-cell type not having achieved remission: Secondary | ICD-10-CM

## 2012-12-24 LAB — CBC WITH DIFFERENTIAL/PLATELET
EOS%: 0.8 % (ref 0.0–7.0)
Eosinophils Absolute: 0.4 10*3/uL (ref 0.0–0.5)
HCT: 34.2 % — ABNORMAL LOW (ref 34.8–46.6)
LYMPH%: 84 % — ABNORMAL HIGH (ref 14.0–49.7)
MCH: 29.3 pg (ref 25.1–34.0)
MCHC: 30.1 g/dL — ABNORMAL LOW (ref 31.5–36.0)
MCV: 97.4 fL (ref 79.5–101.0)
MONO#: 1.2 10*3/uL — ABNORMAL HIGH (ref 0.1–0.9)
MONO%: 2.3 % (ref 0.0–14.0)
NEUT#: 6.4 10*3/uL (ref 1.5–6.5)
NEUT%: 12.6 % — ABNORMAL LOW (ref 38.4–76.8)
Platelets: 309 10*3/uL (ref 145–400)
RBC: 3.51 10*6/uL — ABNORMAL LOW (ref 3.70–5.45)

## 2012-12-24 NOTE — Telephone Encounter (Signed)
Her lab results are stable.  We will follow them in a couple of months.  She denies any constitutional symptoms.

## 2012-12-26 ENCOUNTER — Encounter: Payer: Self-pay | Admitting: Cardiology

## 2012-12-26 ENCOUNTER — Encounter: Payer: Self-pay | Admitting: Interventional Cardiology

## 2012-12-26 ENCOUNTER — Ambulatory Visit (INDEPENDENT_AMBULATORY_CARE_PROVIDER_SITE_OTHER): Payer: Medicare Other | Admitting: Interventional Cardiology

## 2012-12-26 VITALS — BP 130/60 | HR 59 | Ht 65.0 in | Wt 186.0 lb

## 2012-12-26 DIAGNOSIS — I2510 Atherosclerotic heart disease of native coronary artery without angina pectoris: Secondary | ICD-10-CM

## 2012-12-26 DIAGNOSIS — I10 Essential (primary) hypertension: Secondary | ICD-10-CM

## 2012-12-26 DIAGNOSIS — E7800 Pure hypercholesterolemia, unspecified: Secondary | ICD-10-CM

## 2012-12-26 DIAGNOSIS — I739 Peripheral vascular disease, unspecified: Secondary | ICD-10-CM

## 2012-12-26 NOTE — Progress Notes (Signed)
Patient ID: Christine Santos, female   DOB: 04/29/1945, 67 y.o.   MRN: [id number]    [address], [place], [place]  [address] Phone: [phone] Fax:  [phone]  Date:  12/26/2012   ID:  [name], [name] 12/15/1945, MRN [id number]  PCP:  Zigmund Gottron, MD      History of Present Illness: Christine Santos is a 67 y.o. female who had bilateral LE revascularization. The right leg was treated with PTA and the left SFA was treated with rotational atherectomy. She has no claudication. She has some cramps in her legs at night. She is most limited by foot pain. Knee pain is better. LE Doppler showed moderate right SFA stenosis. Likely at the site of prior PTA. Walking limited mostly by foot pain. No ulcers.Cramps at night in both legs.   Wt Readings from Last 3 Encounters:  12/26/12 186 lb (84.369 kg)  11/30/12 185 lb (83.915 kg)  10/23/12 185 lb 3.2 oz (84.006 kg)     Past Medical History  Diagnosis Date  . CLL (chronic lymphoblastic leukemia)   . Hyperlipidemia   . Hypertension   . Anemia 08/29/2011  . Diastolic heart failure   . CAD (coronary artery disease)     s/p CABG in 1999  . Peripheral vascular disease     Current Outpatient Prescriptions  Medication Sig Dispense Refill  . acetaminophen (TYLENOL) 500 MG tablet 500 mg. Take one or two prn up to 6 per day total       . allopurinol (ZYLOPRIM) 300 MG tablet TAKE 1/2 TABLET BY MOUTH EVERY DAY  45 tablet  3  . Ascorbic Acid (VITAMIN C) 100 MG tablet Take 100 mg by mouth daily.      Marland Kitchen aspirin 325 MG tablet Take 162.5 mg by mouth daily. Take 1/2 tab by mouth daily      . atorvastatin (LIPITOR) 80 MG tablet Take 80 mg by mouth daily.      . Choline Fenofibrate (FENOFIBRIC ACID) 45 MG CPDR TAKE 1 CAPSULE BY MOUTH EVERY DAY  90 capsule  0  . cloNIDine (CATAPRES - DOSED IN MG/24 HR) 0.2 mg/24hr patch Place 1 patch (0.2 mg total) onto the skin once a week.  12 patch  3  . clopidogrel (PLAVIX) 75 MG tablet TAKE 1  TABLET BY MOUTH DAILY  90 tablet  3  . felodipine (PLENDIL) 10 MG 24 hr tablet Take 20 mg by mouth daily.        Marland Kitchen GNP GARLIC EXTRACT PO Take 1 tablet by mouth daily.        . hydrochlorothiazide (HYDRODIURIL) 25 MG tablet TAKE 1 TABLET BY MOUTH EVERY DAY  90 tablet  3  . Insulin Glargine (LANTUS SOLOSTAR) 100 UNIT/ML SOPN Inject 35 Units into the skin daily.  5 pen  12  . lisinopril (PRINIVIL,ZESTRIL) 40 MG tablet TAKE 1 TABLET BY MOUTH DAILY  90 tablet  3  . Melatonin 1 MG CAPS Take 1 mg by mouth daily.      . metoprolol tartrate (LOPRESSOR) 25 MG tablet TAKE 1/2 TABLET BY MOUTH TWICE DAILY  90 tablet  3  . Multiple Vitamins-Minerals (MULTIVITAMINS THER. W/MINERALS) TABS Take 1 tablet by mouth daily.        . Omega-3 Fatty Acids (FISH OIL) 1000 MG CAPS Take 2 capsules by mouth 2 (two) times daily.       Marland Kitchen OVER THE COUNTER MEDICATION Take 1 tablet by mouth daily.  Macular Degenerative Eye Supplement       . pantoprazole (PROTONIX) 40 MG tablet TAKE 1 TABLET BY MOUTH DAILY  90 tablet  3  . PARoxetine (PAXIL) 20 MG tablet TAKE 1 TABLET BY MOUTH EVERY MORNING  90 tablet  3  . traMADol (ULTRAM) 50 MG tablet Take 1 tablet (50 mg total) by mouth 4 (four) times daily.  360 tablet  1   Current Facility-Administered Medications  Medication Dose Route Frequency Provider Last Rate Last Dose  . pneumococcal 23 valent vaccine (PNU-IMMUNE) injection 0.5 mL  0.5 mL Intramuscular Once Zigmund Gottron, MD        Allergies:    Allergies  Allergen Reactions  . Amoxicillin Rash    REACTION: rash  . Cephalexin Rash  . Codeine Phosphate Rash    REACTION: rash    Social History:  The patient  reports that she has never smoked. She does not have any smokeless tobacco history on file. She reports that she does not drink alcohol or use illicit drugs.   Family History:  The patient's family history includes Heart disease in her mother; Pneumonia in her father.   ROS:  Please see the history of present  illness.  No nausea, vomiting.  No fevers, chills.  No focal weakness.  No dysuria. Leg pain as noted.  All other systems reviewed and negative.   PHYSICAL EXAM: VS:  BP 130/60  Pulse 59  Ht 5\' 5"  (1.651 m)  Wt 186 lb (84.369 kg)  BMI 30.95 kg/m2 Well nourished, well developed, in no acute distress HEENT: normal Neck: no JVD, no carotid bruits Cardiac:  normal S1, S2; RRR;  Lungs:  clear to auscultation bilaterally, no wheezing, rhonchi or rales Abd: soft, nontender, no hepatomegaly Ext: no edema; 2+ left PT pulse; tr right PT pulse Skin: warm and dry Neuro:   no focal abnormalities noted  EKG:  Sinus bradycardia, T wave inversion laterally.  No change.     ASSESSMENT AND PLAN:  Coronary atherosclerosis of native coronary artery  Continue Aspirin 325 mg tablet, 325 mg, 1/2 tablet, orally, daily Continue Plavix Tablet, 75 MG, 1 tablet, Once a day Continue Metoprolol Tartrate Tablet, 25 MG, 1/2 tab, Orally, Twice a day Notes: No angina. Stress test negative in 2012 for ischemia.  Anterior scar. EF 58%. s/p CABG in 1996.  2. Hypertension, essential  Continue Lisinopril Tablet, 40 mg, 1 tablet, Orally, Once a day Continue HCTZ 25 MG Tablet, 25 MG, 1 tablet, Orally, q AM Continue Clonidine HCl Patch Weekly, 0.2 MG/24HR, 1 patch, Transdermal Continue Felodipine Miscellaneous Unspecified, 10 Milligram, TAKE 2 TABLETS BY MOUTH DAILY Notes: Controlled at other offices. Controlled reading today. 3. Mixed hyperlipidemia  Continue Atorvastatin Calcium Tablet, 40.0 Milligram, TAKE 1 TABLET BY MOUTH ONCE A DAY Notes: LDL 84. TG controlled in 4/14. In 8/14, LDL 76 4. Atherosclerosis of native arteries of the extremities with intermittent claudication  Notes: Claudication resolved. Moderate right SFA disease. Watch for claudication. Could plan for repeat angio if claudication returned. Would try to avoid due to renal insufficiency. Has rest pain, cramping at night.  Foot pain limits her more  than calf pain.  Will check LE arterial Doppler.  LE Doppler in 12/13 did records reviewed.    Signed, Mina Marble, MD, [facility] 12/26/2012 11:34 AM

## 2012-12-26 NOTE — Patient Instructions (Signed)
Your physician has requested that you have a lower or upper extremity arterial duplex. This test is an ultrasound of the arteries in the legs or arms. It looks at arterial blood flow in the legs and arms. Allow one hour for Lower and Upper Arterial scans. There are no restrictions or special instructions  Your physician wants you to follow-up in: 6 months with Dr. Irish Lack. You will receive a reminder letter in the mail two months in advance. If you don't receive a letter, please call our office to schedule the follow-up appointment.

## 2013-01-03 ENCOUNTER — Ambulatory Visit (HOSPITAL_COMMUNITY): Payer: Medicare Other | Attending: Interventional Cardiology

## 2013-01-03 ENCOUNTER — Encounter: Payer: Self-pay | Admitting: Interventional Cardiology

## 2013-01-03 ENCOUNTER — Encounter (HOSPITAL_COMMUNITY): Payer: Self-pay | Admitting: Interventional Cardiology

## 2013-01-03 DIAGNOSIS — I70209 Unspecified atherosclerosis of native arteries of extremities, unspecified extremity: Secondary | ICD-10-CM | POA: Insufficient documentation

## 2013-01-03 DIAGNOSIS — I739 Peripheral vascular disease, unspecified: Secondary | ICD-10-CM | POA: Insufficient documentation

## 2013-01-03 DIAGNOSIS — I70219 Atherosclerosis of native arteries of extremities with intermittent claudication, unspecified extremity: Secondary | ICD-10-CM

## 2013-01-03 DIAGNOSIS — E785 Hyperlipidemia, unspecified: Secondary | ICD-10-CM | POA: Insufficient documentation

## 2013-01-03 DIAGNOSIS — I2510 Atherosclerotic heart disease of native coronary artery without angina pectoris: Secondary | ICD-10-CM | POA: Insufficient documentation

## 2013-01-03 DIAGNOSIS — I10 Essential (primary) hypertension: Secondary | ICD-10-CM | POA: Insufficient documentation

## 2013-01-03 DIAGNOSIS — Z951 Presence of aortocoronary bypass graft: Secondary | ICD-10-CM | POA: Insufficient documentation

## 2013-01-10 ENCOUNTER — Encounter: Payer: Self-pay | Admitting: Cardiology

## 2013-01-10 ENCOUNTER — Telehealth: Payer: Self-pay | Admitting: Interventional Cardiology

## 2013-01-10 DIAGNOSIS — I70219 Atherosclerosis of native arteries of extremities with intermittent claudication, unspecified extremity: Secondary | ICD-10-CM

## 2013-01-10 NOTE — Telephone Encounter (Signed)
Follow Up  Pt returned call// She states 1/30 was OK!// Please call back to discuss.

## 2013-01-10 NOTE — Telephone Encounter (Signed)
lmtrc

## 2013-01-11 ENCOUNTER — Encounter: Payer: Self-pay | Admitting: Cardiology

## 2013-01-11 ENCOUNTER — Encounter (HOSPITAL_COMMUNITY): Payer: Self-pay | Admitting: Pharmacy Technician

## 2013-01-11 NOTE — Telephone Encounter (Signed)
Spoke with pt and she is agreeable for PV angiogram. Pt will come for labs on 01/15/13 and at that time she will ask for me and I will go over instructions.

## 2013-01-15 ENCOUNTER — Other Ambulatory Visit (INDEPENDENT_AMBULATORY_CARE_PROVIDER_SITE_OTHER): Payer: Medicare Other

## 2013-01-15 DIAGNOSIS — I70219 Atherosclerosis of native arteries of extremities with intermittent claudication, unspecified extremity: Secondary | ICD-10-CM

## 2013-01-15 LAB — CBC WITH DIFFERENTIAL/PLATELET
Basophils Relative: 0.1 % (ref 0.0–3.0)
Eosinophils Absolute: 0.3 10*3/uL (ref 0.0–0.7)
Hemoglobin: 10.9 g/dL — ABNORMAL LOW (ref 12.0–15.0)
Lymphocytes Relative: 80.2 % — ABNORMAL HIGH (ref 12.0–46.0)
MCHC: 31.7 g/dL (ref 30.0–36.0)
Monocytes Relative: 6.6 % (ref 3.0–12.0)
Neutro Abs: 6.7 10*3/uL (ref 1.4–7.7)
Neutrophils Relative %: 12.5 % — ABNORMAL LOW (ref 43.0–77.0)
Platelets: 313 10*3/uL (ref 150.0–400.0)
RBC: 3.64 Mil/uL — ABNORMAL LOW (ref 3.87–5.11)
WBC: 53.4 10*3/uL (ref 4.5–10.5)

## 2013-01-15 LAB — BASIC METABOLIC PANEL
CO2: 24 mEq/L (ref 19–32)
Calcium: 9 mg/dL (ref 8.4–10.5)
Creatinine, Ser: 2 mg/dL — ABNORMAL HIGH (ref 0.4–1.2)
GFR: 26.8 mL/min — ABNORMAL LOW (ref >60.00)

## 2013-01-15 LAB — PROTIME-INR
INR: 1 ratio (ref 0.8–1.0)
Prothrombin Time: 10.5 s (ref 10.2–12.4)

## 2013-01-21 ENCOUNTER — Other Ambulatory Visit: Payer: Self-pay | Admitting: Interventional Cardiology

## 2013-01-21 DIAGNOSIS — I739 Peripheral vascular disease, unspecified: Secondary | ICD-10-CM

## 2013-01-22 ENCOUNTER — Encounter (HOSPITAL_COMMUNITY)
Admission: RE | Disposition: A | Discharge status: Home / Self Care | Payer: Self-pay | Source: Ambulatory Visit | Attending: Interventional Cardiology

## 2013-01-22 ENCOUNTER — Ambulatory Visit (HOSPITAL_COMMUNITY)
Admission: RE | Admit: 2013-01-22 | Discharge: 2013-01-23 | Disposition: A | Discharge status: Home / Self Care | Payer: Medicare Other | Source: Ambulatory Visit | Attending: Interventional Cardiology | Admitting: Interventional Cardiology

## 2013-01-22 ENCOUNTER — Encounter (HOSPITAL_COMMUNITY): Payer: Self-pay | Admitting: General Practice

## 2013-01-22 DIAGNOSIS — I70219 Atherosclerosis of native arteries of extremities with intermittent claudication, unspecified extremity: Secondary | ICD-10-CM

## 2013-01-22 DIAGNOSIS — I129 Hypertensive chronic kidney disease with stage 1 through stage 4 chronic kidney disease, or unspecified chronic kidney disease: Secondary | ICD-10-CM | POA: Insufficient documentation

## 2013-01-22 DIAGNOSIS — I70229 Atherosclerosis of native arteries of extremities with rest pain, unspecified extremity: Secondary | ICD-10-CM | POA: Insufficient documentation

## 2013-01-22 DIAGNOSIS — I2510 Atherosclerotic heart disease of native coronary artery without angina pectoris: Secondary | ICD-10-CM | POA: Insufficient documentation

## 2013-01-22 DIAGNOSIS — I5030 Unspecified diastolic (congestive) heart failure: Secondary | ICD-10-CM | POA: Insufficient documentation

## 2013-01-22 DIAGNOSIS — D649 Anemia, unspecified: Secondary | ICD-10-CM | POA: Insufficient documentation

## 2013-01-22 DIAGNOSIS — I739 Peripheral vascular disease, unspecified: Secondary | ICD-10-CM

## 2013-01-22 DIAGNOSIS — E782 Mixed hyperlipidemia: Secondary | ICD-10-CM | POA: Insufficient documentation

## 2013-01-22 DIAGNOSIS — Z7982 Long term (current) use of aspirin: Secondary | ICD-10-CM | POA: Insufficient documentation

## 2013-01-22 DIAGNOSIS — Z7902 Long term (current) use of antithrombotics/antiplatelets: Secondary | ICD-10-CM | POA: Insufficient documentation

## 2013-01-22 DIAGNOSIS — Z951 Presence of aortocoronary bypass graft: Secondary | ICD-10-CM | POA: Insufficient documentation

## 2013-01-22 DIAGNOSIS — N189 Chronic kidney disease, unspecified: Secondary | ICD-10-CM | POA: Diagnosis present

## 2013-01-22 DIAGNOSIS — C9110 Chronic lymphocytic leukemia of B-cell type not having achieved remission: Secondary | ICD-10-CM | POA: Insufficient documentation

## 2013-01-22 DIAGNOSIS — Z794 Long term (current) use of insulin: Secondary | ICD-10-CM | POA: Insufficient documentation

## 2013-01-22 DIAGNOSIS — N183 Chronic kidney disease, stage 3 unspecified: Secondary | ICD-10-CM

## 2013-01-22 HISTORY — DX: Unspecified osteoarthritis, unspecified site: M19.90

## 2013-01-22 HISTORY — DX: Acute myocardial infarction, unspecified: I21.9

## 2013-01-22 HISTORY — DX: Personal history of other diseases of the digestive system: Z87.19

## 2013-01-22 HISTORY — DX: Peripheral vascular disease, unspecified: I73.9

## 2013-01-22 HISTORY — DX: Reserved for inherently not codable concepts without codable children: IMO0001

## 2013-01-22 HISTORY — DX: Long term (current) use of insulin: Z79.4

## 2013-01-22 HISTORY — DX: Cardiac murmur, unspecified: R01.1

## 2013-01-22 HISTORY — DX: Type 2 diabetes mellitus without complications: E11.9

## 2013-01-22 HISTORY — DX: Gastro-esophageal reflux disease without esophagitis: K21.9

## 2013-01-22 HISTORY — PX: TRANSLUMINAL ATHERECTOMY FEMORAL ARTERY: SHX2567

## 2013-01-22 HISTORY — PX: LOWER EXTREMITY ANGIOGRAM: SHX5508

## 2013-01-22 LAB — BASIC METABOLIC PANEL
BUN: 48 mg/dL — ABNORMAL HIGH (ref 6–23)
Chloride: 108 mEq/L (ref 96–112)
Creatinine, Ser: 1.94 mg/dL — ABNORMAL HIGH (ref 0.50–1.10)
GFR calc non Af Amer: 25 mL/min — ABNORMAL LOW (ref >90)
Glucose, Bld: 126 mg/dL — ABNORMAL HIGH (ref 70–99)
Potassium: 5 mEq/L (ref 3.7–5.3)

## 2013-01-22 LAB — POCT ACTIVATED CLOTTING TIME
Activated Clotting Time: 193 seconds
Activated Clotting Time: 227 seconds
Activated Clotting Time: 155 seconds

## 2013-01-22 LAB — GLUCOSE, CAPILLARY
Glucose-Capillary: 129 mg/dL — ABNORMAL HIGH (ref 70–99)
Glucose-Capillary: 110 mg/dL — ABNORMAL HIGH (ref 70–99)
Glucose-Capillary: 132 mg/dL — ABNORMAL HIGH (ref 70–99)

## 2013-01-22 SURGERY — ANGIOGRAM, LOWER EXTREMITY
Anesthesia: LOCAL | Laterality: Right

## 2013-01-22 MED ORDER — CLOPIDOGREL BISULFATE 75 MG PO TABS: 75.0000 mg | ORAL_TABLET | Freq: Every day | ORAL | Status: DC

## 2013-01-22 MED ORDER — SODIUM BICARBONATE 8.4 % IV SOLN
INTRAVENOUS | Status: AC
  Administered 2013-01-22: 12:00:00 via INTRAVENOUS
  Filled 2013-01-22 (×2): qty 1000

## 2013-01-22 MED ORDER — CLONIDINE HCL 0.2 MG/24HR TD PTWK: 0.2000 mg | MEDICATED_PATCH | TRANSDERMAL | Status: DC

## 2013-01-22 MED ORDER — INSULIN GLARGINE 100 UNIT/ML SOLOSTAR PEN
35.0000 [IU] | PEN_INJECTOR | Freq: Every day | SUBCUTANEOUS | Status: DC
Start: 2013-01-22 — End: 2013-01-22

## 2013-01-22 MED ORDER — CLOPIDOGREL BISULFATE 75 MG PO TABS
75.0000 mg | ORAL_TABLET | Freq: Every day | ORAL | Status: DC
  Administered 2013-01-23: 75 mg via ORAL
  Filled 2013-01-22: qty 1

## 2013-01-22 MED ORDER — INSULIN GLARGINE 100 UNIT/ML ~~LOC~~ SOLN
35.0000 [IU] | Freq: Every day | SUBCUTANEOUS | Status: DC
  Administered 2013-01-22 – 2013-01-23 (×2): 35 [IU] via SUBCUTANEOUS
  Filled 2013-01-22 (×2): qty 0.35

## 2013-01-22 MED ORDER — ONDANSETRON HCL 4 MG/2ML IJ SOLN
4.0000 mg | Freq: Four times a day (QID) | INTRAMUSCULAR | Status: DC | PRN
  Administered 2013-01-22: 4 mg via INTRAVENOUS
  Filled 2013-01-22: qty 2

## 2013-01-22 MED ORDER — NITROGLYCERIN IN D5W 200-5 MCG/ML-% IV SOLN
INTRAVENOUS | Status: AC
Start: 2013-01-22 — End: 2013-01-22
  Filled 2013-01-22: qty 250

## 2013-01-22 MED ORDER — ASPIRIN 81 MG PO CHEW
CHEWABLE_TABLET | ORAL | Status: AC
  Filled 2013-01-22: qty 1

## 2013-01-22 MED ORDER — VERAPAMIL HCL 2.5 MG/ML IV SOLN
INTRAVENOUS | Status: AC
  Filled 2013-01-22: qty 2

## 2013-01-22 MED ORDER — SODIUM BICARBONATE BOLUS VIA INFUSION
INTRAVENOUS | Status: AC
  Administered 2013-01-22: 250 meq via INTRAVENOUS
  Filled 2013-01-22 (×2): qty 1

## 2013-01-22 MED ORDER — FELODIPINE ER 10 MG PO TB24
20.0000 mg | ORAL_TABLET | Freq: Every day | ORAL | Status: DC
  Administered 2013-01-23: 20 mg via ORAL
  Filled 2013-01-22: qty 2

## 2013-01-22 MED ORDER — FENTANYL CITRATE 0.05 MG/ML IJ SOLN
INTRAMUSCULAR | Status: AC
  Filled 2013-01-22: qty 2

## 2013-01-22 MED ORDER — PAROXETINE HCL 20 MG PO TABS
20.0000 mg | ORAL_TABLET | Freq: Every day | ORAL | Status: DC
  Administered 2013-01-23: 11:00:00 20 mg via ORAL
  Filled 2013-01-22: qty 1

## 2013-01-22 MED ORDER — ASPIRIN 325 MG PO TABS: 162.5000 mg | ORAL_TABLET | Freq: Every day | ORAL | Status: DC

## 2013-01-22 MED ORDER — MIDAZOLAM HCL 2 MG/2ML IJ SOLN
INTRAMUSCULAR | Status: AC
  Filled 2013-01-22: qty 2

## 2013-01-22 MED ORDER — SODIUM CHLORIDE 0.9 % IV SOLN: 250.0000 mL | INTRAVENOUS | Status: DC | PRN

## 2013-01-22 MED ORDER — HYDRALAZINE HCL 20 MG/ML IJ SOLN
INTRAMUSCULAR | Status: AC
  Filled 2013-01-22: qty 1

## 2013-01-22 MED ORDER — ATORVASTATIN CALCIUM 80 MG PO TABS
80.0000 mg | ORAL_TABLET | Freq: Every day | ORAL | Status: DC
  Administered 2013-01-22: 18:00:00 80 mg via ORAL
  Filled 2013-01-22 (×2): qty 1

## 2013-01-22 MED ORDER — ASPIRIN EC 325 MG PO TBEC
325.0000 mg | DELAYED_RELEASE_TABLET | Freq: Every day | ORAL | Status: DC
  Administered 2013-01-23: 325 mg via ORAL
  Filled 2013-01-22: qty 1

## 2013-01-22 MED ORDER — SODIUM CHLORIDE 0.9 % IV SOLN
INTRAVENOUS | Status: DC
  Administered 2013-01-22: 75 mL/h via INTRAVENOUS

## 2013-01-22 MED ORDER — LIDOCAINE HCL (PF) 1 % IJ SOLN
INTRAMUSCULAR | Status: AC
  Filled 2013-01-22: qty 30

## 2013-01-22 MED ORDER — ASPIRIN 81 MG PO CHEW: 81.0000 mg | CHEWABLE_TABLET | ORAL | Status: DC

## 2013-01-22 MED ORDER — SODIUM CHLORIDE 0.9 % IV SOLN
1.0000 mL/kg/h | INTRAVENOUS | Status: AC
  Administered 2013-01-22: 1 mL/kg/h via INTRAVENOUS

## 2013-01-22 MED ORDER — MORPHINE SULFATE 2 MG/ML IJ SOLN
2.0000 mg | Freq: Once | INTRAMUSCULAR | Status: AC
  Administered 2013-01-22: 16:00:00 4 mg via INTRAVENOUS
  Filled 2013-01-22: qty 2

## 2013-01-22 MED ORDER — ACETAMINOPHEN 325 MG PO TABS: 650.0000 mg | ORAL_TABLET | ORAL | Status: DC | PRN

## 2013-01-22 MED ORDER — SODIUM CHLORIDE 0.9 % IJ SOLN: 3.0000 mL | Freq: Two times a day (BID) | INTRAMUSCULAR | Status: DC

## 2013-01-22 MED ORDER — METOPROLOL TARTRATE 12.5 MG HALF TABLET
12.5000 mg | ORAL_TABLET | Freq: Two times a day (BID) | ORAL | Status: DC
  Administered 2013-01-22 – 2013-01-23 (×2): 12.5 mg via ORAL
  Filled 2013-01-22 (×3): qty 1

## 2013-01-22 MED ORDER — PANTOPRAZOLE SODIUM 40 MG PO TBEC
40.0000 mg | DELAYED_RELEASE_TABLET | Freq: Every day | ORAL | Status: DC
  Administered 2013-01-22 – 2013-01-23 (×2): 40 mg via ORAL
  Filled 2013-01-22 (×3): qty 1

## 2013-01-22 MED ORDER — HEPARIN (PORCINE) IN NACL 2-0.9 UNIT/ML-% IJ SOLN
INTRAMUSCULAR | Status: AC
  Filled 2013-01-22: qty 1000

## 2013-01-22 MED ORDER — SODIUM BICARBONATE 8.4 % IV SOLN: INTRAVENOUS | Status: DC

## 2013-01-22 MED ORDER — HYDRALAZINE HCL 20 MG/ML IJ SOLN
10.0000 mg | INTRAMUSCULAR | Status: DC | PRN
  Administered 2013-01-22 (×2): 20 mg via INTRAVENOUS
  Administered 2013-01-23: 10 mg via INTRAVENOUS
  Filled 2013-01-22 (×3): qty 1

## 2013-01-22 MED ORDER — ALLOPURINOL 150 MG HALF TABLET
150.0000 mg | ORAL_TABLET | Freq: Every day | ORAL | Status: DC
  Administered 2013-01-23: 150 mg via ORAL
  Filled 2013-01-22: qty 1

## 2013-01-22 MED ORDER — TRAMADOL HCL 50 MG PO TABS
50.0000 mg | ORAL_TABLET | Freq: Four times a day (QID) | ORAL | Status: DC
  Administered 2013-01-22 – 2013-01-23 (×4): 50 mg via ORAL
  Filled 2013-01-22 (×4): qty 1

## 2013-01-22 MED ORDER — SODIUM CHLORIDE 0.9 % IJ SOLN: 3.0000 mL | INTRAMUSCULAR | Status: DC | PRN

## 2013-01-22 MED ORDER — SODIUM BICARBONATE BOLUS VIA INFUSION
INTRAVENOUS | Status: DC
  Filled 2013-01-22: qty 1

## 2013-01-22 MED ORDER — ONDANSETRON HCL 4 MG/2ML IJ SOLN: 4.0000 mg | Freq: Four times a day (QID) | INTRAMUSCULAR | Status: DC | PRN

## 2013-01-22 MED ORDER — HEPARIN SODIUM (PORCINE) 1000 UNIT/ML IJ SOLN
INTRAMUSCULAR | Status: AC
  Filled 2013-01-22: qty 1

## 2013-01-22 NOTE — Interval H&P Note (Signed)
History and Physical Interval Note:  01/22/2013 11:12 AM  Christine Santos  has presented today for surgery, with the diagnosis of Claudication  The various methods of treatment have been discussed with the patient and family. After consideration of risks, benefits and other options for treatment, the patient has consented to  Procedure(s): LOWER EXTREMITY ANGIOGRAM (N/A) as a surgical intervention .  The patient's history has been reviewed, patient examined, no change in status, stable for surgery.  I have reviewed the patient's chart and labs.  Questions were answered to the patient's satisfaction.  Will try to minimize contrast use.   Delinda Malan S.

## 2013-01-22 NOTE — Progress Notes (Signed)
Tele showed 8 beats VT then return to SR.  Pt talking on phone, asymptomatic, BP 150/42.  K+ 5.0 this am, BMET ordered for tomorrow. Dr Colon Flattery notified, no orders received.

## 2013-01-22 NOTE — CV Procedure (Signed)
PROCEDURE:  Bilateral selective LE runoff; rotational atherectomy of the right SFA. PTA of the right SFA.  INDICATIONS:  Claudication  The risks, benefits, and details of the procedure were explained to the patient.  The patient verbalized understanding and wanted to proceed.  Informed written consent was obtained.  PROCEDURE TECHNIQUE:  After Xylocaine anesthesia a 58F sheath was placed in the left femoral artery with a single anterior needle wall stick.  A LIMA catheter was used to get access to the right iliac system with a Versa core wire. The Versa core wire was advanced to the right common femoral artery. Over this wire, a 4 French straight catheter was advanced to the right common femoral artery. Through the catheter, a selective right lower extremity angiogram was performed. The intervention was then performed. Please see below for details.   After the intervention on the right SFA was complete, the long sheath was then pulled back from the right external iliac artery to the left external iliac artery.  The left lower extremity angiogram was then performed through the sheath.  The sheath will be removed using manual compression.   CONTRAST:  Total of 90 cc.  COMPLICATIONS:  None.    HEMODYNAMICS:  Aortic pressure was 157/60; LEIA pressure was 160/62; REIA 160/58.  There was no gradient between the bilateral iliacs and aorta.    ANGIOGRAPHIC DATA:     Right lower extremity: The right common femoral artery appears widely patent. The right profunda femoral artery is patent. In the proximal to mid SFA, there are several areas of severe stenosis, up to 95% which are heavily calcified. This occurs just above the previously placed stent. The stent appears widely patent. The area of prior angioplasty at the proximal edge of the stent is also widely patent. The right popliteal artery is widely patent. The anterior tibial artery is the dominant vessel below the knee. The tibial peroneal trunk is  patent. The posterior tibial artery is occluded in the mid section but reconstitutes distally by collaterals. The paranasal artery is smaller but patent.  Left lower extremity: The distal left external iliac artery is widely patent. The left common femoral artery is widely patent. The left profunda femoral artery is widely patent. In the proximal left SFA, there is moderate calcific atherosclerosis. The area of prior rotational atherectomy of the distal SFA is widely patent. The popliteal artery is widely patent. Below the knee there is three-vessel runoff with diffuse mild disease.  INTERVENTIONAL NARRATIVE: A Rosen wire was placed through the straight catheter that was used for the diagnostic imaging of the right lower extremity. The straight catheter was removed. A 6 French Terumo sheath was carefully advanced over the Marquette wire into the right external iliac artery. Intravenous heparin was administered. ACT was used to check that the heparin was therapeutic. A 0.14 inch Viper wire was advanced through the sheath into the SFA and across the area of stenosis in the proximal to mid right SFA. There was some difficulty navigating the calcium. A classic 2.0 CSI crown was advanced and 5 passes were made, 2 at the lowest, 2 at a medium and one at high speed. There is a significantly improved angiographic result, however, there was still residual stenosis of significance.  A 4 mm x 40 mm balloon was then inflated across the entire area disease to 6 atmospheres. The inflation was held for 1 minute. The balloon was removed. There is a significantly improved angiographic appearance to the vessel. There  is no visible dissection. Given the patient's renal insufficiency, we did not want to manipulate the vessel anymore and risk having to use more contrast. At this point, the sheath was pulled back and the left lower extremity angiogram was performed.    IMPRESSIONS:  1. Severe calcific disease of the proximal to mid  right SFA. Patent right SFA stent.  2. Mild to moderate, calcific atherosclerosis of the proximal left SFA. 3.  Aorta and iliac arteries not visualized to minimize contrast exposure. No pressure gradient between the aorta and iliac arteries evident.   4. Successful rotational atherectomy of the proximal to mid right SFA. Successful PTA of the proximal to mid right SFA.  RECOMMENDATION:  Hydrate overnight due to renal insufficiency.  Continue dual antiplatelet therapy.

## 2013-01-22 NOTE — Progress Notes (Signed)
Site area: left groin  Site Prior to Removal:  Level 2  Pressure Applied For 20 MINUTES    Minutes Beginning at 1740  Manual:   yes  Patient Status During Pull:  stable  Post Pull Groin Site:  Level 0  Post Pull Instructions Given:  yes  Post Pull Pulses Present:  yes  Dressing Applied:  yes  Comments:  Arrived with golf ball size hematoma; remained stable, pressed out with post pull hold.

## 2013-01-22 NOTE — H&P (View-Only) (Signed)
Patient ID: Christine Santos, female   DOB: 1945-08-10, 67 y.o.   MRN: [id number]    [place], [place], [place]  [address] Phone: [phone] Fax:  [phone]  Date:  12/26/2012   ID:  Breeann, Reyes 08/30/1945, MRN [id number]  PCP:  Zigmund Gottron, MD      History of Present Illness: Christine Santos is a 67 y.o. female who had bilateral LE revascularization. The right leg was treated with PTA and the left SFA was treated with rotational atherectomy. She has no claudication. She has some cramps in her legs at night. She is most limited by foot pain. Knee pain is better. LE Doppler showed moderate right SFA stenosis. Likely at the site of prior PTA. Walking limited mostly by foot pain. No ulcers.Cramps at night in both legs.   Wt Readings from Last 3 Encounters:  12/26/12 186 lb (84.369 kg)  11/30/12 185 lb (83.915 kg)  10/23/12 185 lb 3.2 oz (84.006 kg)     Past Medical History  Diagnosis Date  . CLL (chronic lymphoblastic leukemia)   . Hyperlipidemia   . Hypertension   . Anemia 08/29/2011  . Diastolic heart failure   . CAD (coronary artery disease)     s/p CABG in 1999  . Peripheral vascular disease     Current Outpatient Prescriptions  Medication Sig Dispense Refill  . acetaminophen (TYLENOL) 500 MG tablet 500 mg. Take one or two prn up to 6 per day total       . allopurinol (ZYLOPRIM) 300 MG tablet TAKE 1/2 TABLET BY MOUTH EVERY DAY  45 tablet  3  . Ascorbic Acid (VITAMIN C) 100 MG tablet Take 100 mg by mouth daily.      Marland Kitchen aspirin 325 MG tablet Take 162.5 mg by mouth daily. Take 1/2 tab by mouth daily      . atorvastatin (LIPITOR) 80 MG tablet Take 80 mg by mouth daily.      . Choline Fenofibrate (FENOFIBRIC ACID) 45 MG CPDR TAKE 1 CAPSULE BY MOUTH EVERY DAY  90 capsule  0  . cloNIDine (CATAPRES - DOSED IN MG/24 HR) 0.2 mg/24hr patch Place 1 patch (0.2 mg total) onto the skin once a week.  12 patch  3  . clopidogrel (PLAVIX) 75 MG tablet TAKE 1  TABLET BY MOUTH DAILY  90 tablet  3  . felodipine (PLENDIL) 10 MG 24 hr tablet Take 20 mg by mouth daily.        Marland Kitchen GNP GARLIC EXTRACT PO Take 1 tablet by mouth daily.        . hydrochlorothiazide (HYDRODIURIL) 25 MG tablet TAKE 1 TABLET BY MOUTH EVERY DAY  90 tablet  3  . Insulin Glargine (LANTUS SOLOSTAR) 100 UNIT/ML SOPN Inject 35 Units into the skin daily.  5 pen  12  . lisinopril (PRINIVIL,ZESTRIL) 40 MG tablet TAKE 1 TABLET BY MOUTH DAILY  90 tablet  3  . Melatonin 1 MG CAPS Take 1 mg by mouth daily.      . metoprolol tartrate (LOPRESSOR) 25 MG tablet TAKE 1/2 TABLET BY MOUTH TWICE DAILY  90 tablet  3  . Multiple Vitamins-Minerals (MULTIVITAMINS THER. W/MINERALS) TABS Take 1 tablet by mouth daily.        . Omega-3 Fatty Acids (FISH OIL) 1000 MG CAPS Take 2 capsules by mouth 2 (two) times daily.       Marland Kitchen OVER THE COUNTER MEDICATION Take 1 tablet by mouth daily.  Macular Degenerative Eye Supplement       . pantoprazole (PROTONIX) 40 MG tablet TAKE 1 TABLET BY MOUTH DAILY  90 tablet  3  . PARoxetine (PAXIL) 20 MG tablet TAKE 1 TABLET BY MOUTH EVERY MORNING  90 tablet  3  . traMADol (ULTRAM) 50 MG tablet Take 1 tablet (50 mg total) by mouth 4 (four) times daily.  360 tablet  1   Current Facility-Administered Medications  Medication Dose Route Frequency Provider Last Rate Last Dose  . pneumococcal 23 valent vaccine (PNU-IMMUNE) injection 0.5 mL  0.5 mL Intramuscular Once Zigmund Gottron, MD        Allergies:    Allergies  Allergen Reactions  . Amoxicillin Rash    REACTION: rash  . Cephalexin Rash  . Codeine Phosphate Rash    REACTION: rash    Social History:  The patient  reports that she has never smoked. She does not have any smokeless tobacco history on file. She reports that she does not drink alcohol or use illicit drugs.   Family History:  The patient's family history includes Heart disease in her mother; Pneumonia in her father.   ROS:  Please see the history of present  illness.  No nausea, vomiting.  No fevers, chills.  No focal weakness.  No dysuria. Leg pain as noted.  All other systems reviewed and negative.   PHYSICAL EXAM: VS:  BP 130/60  Pulse 59  Ht 5\' 5"  (1.651 m)  Wt 186 lb (84.369 kg)  BMI 30.95 kg/m2 Well nourished, well developed, in no acute distress HEENT: normal Neck: no JVD, no carotid bruits Cardiac:  normal S1, S2; RRR;  Lungs:  clear to auscultation bilaterally, no wheezing, rhonchi or rales Abd: soft, nontender, no hepatomegaly Ext: no edema; 2+ left PT pulse; tr right PT pulse Skin: warm and dry Neuro:   no focal abnormalities noted  EKG:  Sinus bradycardia, T wave inversion laterally.  No change.     ASSESSMENT AND PLAN:  Coronary atherosclerosis of native coronary artery  Continue Aspirin 325 mg tablet, 325 mg, 1/2 tablet, orally, daily Continue Plavix Tablet, 75 MG, 1 tablet, Once a day Continue Metoprolol Tartrate Tablet, 25 MG, 1/2 tab, Orally, Twice a day Notes: No angina. Stress test negative in 2012 for ischemia.  Anterior scar. EF 58%. s/p CABG in 1996.  2. Hypertension, essential  Continue Lisinopril Tablet, 40 mg, 1 tablet, Orally, Once a day Continue HCTZ 25 MG Tablet, 25 MG, 1 tablet, Orally, q AM Continue Clonidine HCl Patch Weekly, 0.2 MG/24HR, 1 patch, Transdermal Continue Felodipine Miscellaneous Unspecified, 10 Milligram, TAKE 2 TABLETS BY MOUTH DAILY Notes: Controlled at other offices. Controlled reading today. 3. Mixed hyperlipidemia  Continue Atorvastatin Calcium Tablet, 40.0 Milligram, TAKE 1 TABLET BY MOUTH ONCE A DAY Notes: LDL 84. TG controlled in 4/14. In 8/14, LDL 76 4. Atherosclerosis of native arteries of the extremities with intermittent claudication  Notes: Claudication resolved. Moderate right SFA disease. Watch for claudication. Could plan for repeat angio if claudication returned. Would try to avoid due to renal insufficiency. Has rest pain, cramping at night.  Foot pain limits her more  than calf pain.  Will check LE arterial Doppler.  LE Doppler in 12/13 did records reviewed.    Signed, Mina Marble, MD, [facility] 12/26/2012 11:34 AM

## 2013-01-23 DIAGNOSIS — N183 Chronic kidney disease, stage 3 unspecified: Secondary | ICD-10-CM

## 2013-01-23 DIAGNOSIS — I739 Peripheral vascular disease, unspecified: Secondary | ICD-10-CM

## 2013-01-23 LAB — CBC
HCT: 32.3 % — ABNORMAL LOW (ref 36.0–46.0)
MCHC: 31 g/dL (ref 30.0–36.0)
MCV: 97.3 fL (ref 78.0–100.0)
Platelets: 275 10*3/uL (ref 150–400)
RDW: 16.2 % — ABNORMAL HIGH (ref 11.5–15.5)

## 2013-01-23 LAB — BASIC METABOLIC PANEL
BUN: 41 mg/dL — ABNORMAL HIGH (ref 6–23)
Calcium: 8.5 mg/dL (ref 8.4–10.5)
Chloride: 105 mEq/L (ref 96–112)
Creatinine, Ser: 2.12 mg/dL — ABNORMAL HIGH (ref 0.50–1.10)
GFR calc Af Amer: 26 mL/min — ABNORMAL LOW (ref >90)
GFR calc non Af Amer: 23 mL/min — ABNORMAL LOW (ref >90)
Potassium: 5.1 mEq/L (ref 3.7–5.3)

## 2013-01-23 LAB — GLUCOSE, CAPILLARY

## 2013-01-23 NOTE — Progress Notes (Signed)
SUBJECTIVE:  Feels that she has caught a cold.  Voice is hoarse.  OBJECTIVE:   Vitals:   Filed Vitals:   01/23/13 0044 01/23/13 0637 01/23/13 0738 01/23/13 0836  BP: 167/42 172/45 158/52 158/52  Pulse: 72 78 74   Temp: 98.3 F (36.8 C) 99.4 F (37.4 C) 99.8 F (37.7 C)   TempSrc: Oral Oral Oral   Resp: 18 18 18    Height:      Weight: 185 lb 6.5 oz (84.1 kg)     SpO2: 96% 97% 90%    I&O's:   Intake/Output Summary (Last 24 hours) at 01/23/13 [id number] Last data filed at 01/23/13 [id number]  Gross per 24 hour  Intake 1696.04 ml  Output   1400 ml  Net 296.04 ml   TELEMETRY: Reviewed telemetry pt in NSR:     PHYSICAL EXAM General: Well developed, well nourished, in no acute distress Head: Marland Kitchen   Normal cephalic and atramatic  Lungs:  No wheezing Heart:  HRRR S1 S2  Abdomen: abdomen soft and non-tender Msk:   Normal strength and tone for age. Extremities: No edema.  DP +2 bilaterally.  Mild left groin bruising. No hematoma. Neuro: Alert and oriented X 3. Psych:  Normal affect, responds appropriately   LABS: Basic Metabolic Panel:  Recent Labs  01/22/13 0920 01/23/13 0715  NA 145 142  K 5.0 5.1  CL 108 105  CO2 23 22  GLUCOSE 126* 114*  BUN 48* 41*  CREATININE 1.94* 2.12*  CALCIUM 9.5 8.5   Liver Function Tests: No results found for this basename: AST, ALT, ALKPHOS, BILITOT, PROT, ALBUMIN,  in the last 72 hours No results found for this basename: LIPASE, AMYLASE,  in the last 72 hours CBC:  Recent Labs  01/23/13 0715  WBC 47.8*  HGB 10.0*  HCT 32.3*  MCV 97.3  PLT 275   Cardiac Enzymes: No results found for this basename: CKTOTAL, CKMB, CKMBINDEX, TROPONINI,  in the last 72 hours BNP: No components found with this basename: POCBNP,  D-Dimer: No results found for this basename: DDIMER,  in the last 72 hours Hemoglobin A1C: No results found for this basename: HGBA1C,  in the last 72 hours Fasting Lipid Panel: No results found for this basename: CHOL, HDL,  LDLCALC, TRIG, CHOLHDL, LDLDIRECT,  in the last 72 hours Thyroid Function Tests: No results found for this basename: TSH, T4TOTAL, FREET3, T3FREE, THYROIDAB,  in the last 72 hours Anemia Panel: No results found for this basename: VITAMINB12, FOLATE, FERRITIN, TIBC, IRON, RETICCTPCT,  in the last 72 hours Coag Panel:   Lab Results  Component Value Date   INR 1.0 01/15/2013   INR 1.01 01/30/2009    RADIOLOGY: No results found.    ASSESSMENT: s/p SFA intervention. CKD  PLAN:  If Cr ok, and patient walks while maintaining sats, will plan on d/c later today.  [name]., MD  01/23/2013  9:03 AM

## 2013-01-23 NOTE — Discharge Summary (Signed)
Patient ID: KALINA KAMMER MRN: [id number] DOB/AGE: 68-Dec-1947 68 y.o.  Admit date: 01/22/2013 Discharge date: 01/23/2013  Primary Discharge Diagnosis Claudication Secondary Discharge Diagnosis Chronic renal insufficiency  Significant Diagnostic Studies: angiography: Bilateral selective LE runoff; rotational atherectomy of the right SFA. PTA of the right SFA.   Consults: None  Hospital Course: 67 y/o who had the above procedure.  SHe was kept overnight due to renal insufficiency.  She was aggressively hydrated.  She walked without dificulties the day after procedure.  She had a hoarse voice but was otherwise ok.  No claudication.  We will check labs in a few weeks.  Restart regular antihypertensives which were held for procedure.  She feels ready to go home.  Patient counseled about post procedure instructions.   Discharge Exam: Blood pressure 147/42, pulse 84, temperature 99.8 F (37.7 C), temperature source Oral, resp. rate 18, height 5\' 5"  (1.651 m), weight 185 lb 6.5 oz (84.1 kg), SpO2 91.00%.  [place]/AT; hoarse voice RRR S1S2 No wheezing Left groin bruised; no hematoma 2+ DP pulses bilaterally; right improved from pre procedure Labs:   Lab Results  Component Value Date   WBC 47.8* 01/23/2013   HGB 10.0* 01/23/2013   HCT 32.3* 01/23/2013   MCV 97.3 01/23/2013   PLT 275 01/23/2013    Recent Labs Lab 01/23/13 0715  NA 142  K 5.1  CL 105  CO2 22  BUN 41*  CREATININE 2.12*  CALCIUM 8.5  GLUCOSE 114*   Lab Results  Component Value Date   CKTOTAL 47 01/07/2009   CKMB 2.7 01/07/2009   TROPONINI  Value: 0.39        PERSISTENTLY INCREASED TROPONIN VALUES IN THE RANGE OF 0.06-0.49 ng/mL CAN BE SEEN IN:       -UNSTABLE ANGINA       -CONGESTIVE HEART FAILURE       -MYOCARDITIS       -CHEST TRAUMA       -ARRYHTHMIAS       -LATE PRESENTING MI       -COPD   CLINICAL FOLLOW-UP RECOMMENDED.* 01/07/2009    Lab Results  Component Value Date   CHOL 128 04/13/2011   CHOL 107  12/04/2008   CHOL 123 11/26/2007   Lab Results  Component Value Date   HDL 44 04/13/2011   HDL 34* 12/04/2008   HDL 40 11/26/2007   Lab Results  Component Value Date   LDLCALC 57 04/13/2011   LDLCALC 42 12/04/2008   LDLCALC 62 11/26/2007   Lab Results  Component Value Date   TRIG 135 04/13/2011   TRIG 157* 12/04/2008   TRIG 157 11/26/2007   Lab Results  Component Value Date   CHOLHDL 2.9 04/13/2011   CHOLHDL 3.1 Ratio 12/04/2008   No results found for this basename: LDLDIRECT        FOLLOW UP PLANS AND APPOINTMENTS  Future Appointments Provider Department Dept Phone   02/22/2013 1:00 PM [facility] [place] [phone]   02/22/2013 1:30 PM [facility] Provider [place] [phone]   03/11/2013 8:20 AM [facility] [place] Office [phone]       Medication List         allopurinol 300 MG tablet  Commonly known as:  ZYLOPRIM  Take 150 mg by mouth daily.     aspirin 325 MG tablet  Take 162.5 mg by mouth daily. Take 1/2 tab by mouth daily  atorvastatin 80 MG tablet  Commonly known as:  LIPITOR  Take 80 mg by mouth daily.     Choline Fenofibrate 45 MG capsule  Take 45 mg by mouth daily.     cloNIDine 0.2 mg/24hr patch  Commonly known as:  CATAPRES - Dosed in mg/24 hr  Place 0.2 mg onto the skin once a week. On Sunday     clopidogrel 75 MG tablet  Commonly known as:  PLAVIX  Take 75 mg by mouth daily with breakfast.     felodipine 10 MG 24 hr tablet  Commonly known as:  PLENDIL  Take 20 mg by mouth daily.     Fish Oil 1000 MG Caps  Take 2,000 mg by mouth 2 (two) times daily.     GNP GARLIC EXTRACT PO  Take 1 tablet by mouth daily.     hydrochlorothiazide 25 MG tablet  Commonly known as:  HYDRODIURIL  Take 25 mg by mouth daily.     Insulin Glargine 100 UNIT/ML Sopn  Commonly known as:  LANTUS SOLOSTAR  Inject 35 Units into the skin daily.      lisinopril 40 MG tablet  Commonly known as:  PRINIVIL,ZESTRIL  Take 40 mg by mouth daily.     Melatonin 3 MG Tabs  Take 3 mg by mouth at bedtime.     metoprolol tartrate 25 MG tablet  Commonly known as:  LOPRESSOR  Take 12.5 mg by mouth 2 (two) times daily.     multivitamins ther. w/minerals Tabs tablet  Take 1 tablet by mouth daily.     OVER THE COUNTER MEDICATION  Take 1 tablet by mouth daily. Macular Degenerative Eye Supplement     pantoprazole 40 MG tablet  Commonly known as:  PROTONIX  Take 40 mg by mouth daily.     PARoxetine 20 MG tablet  Commonly known as:  PAXIL  Take 20 mg by mouth daily.     traMADol 50 MG tablet  Commonly known as:  ULTRAM  Take 1 tablet (50 mg total) by mouth 4 (four) times daily.     vitamin C 100 MG tablet  Take 100 mg by mouth daily.           Follow-up Information   Follow up with [name]., MD. Schedule an appointment as soon as possible for a visit in 2 weeks. (with [facility])    Specialty:  Cardiology   Contact information:   [id number] [address] [phone]       BRING ALL MEDICATIONS WITH YOU TO FOLLOW UP APPOINTMENTS  Time spent with patient to include physician time: 25 minutes Signed: Jarelle Ates S. 01/23/2013, 10:47 AM

## 2013-01-28 ENCOUNTER — Other Ambulatory Visit: Payer: Self-pay | Admitting: Interventional Cardiology

## 2013-01-30 ENCOUNTER — Other Ambulatory Visit: Payer: Self-pay | Admitting: Interventional Cardiology

## 2013-02-08 ENCOUNTER — Ambulatory Visit (INDEPENDENT_AMBULATORY_CARE_PROVIDER_SITE_OTHER): Payer: Medicare Other | Admitting: *Deleted

## 2013-02-08 ENCOUNTER — Ambulatory Visit (INDEPENDENT_AMBULATORY_CARE_PROVIDER_SITE_OTHER): Payer: Medicare Other | Admitting: Interventional Cardiology

## 2013-02-08 ENCOUNTER — Other Ambulatory Visit: Payer: Medicare Other

## 2013-02-08 ENCOUNTER — Encounter (INDEPENDENT_AMBULATORY_CARE_PROVIDER_SITE_OTHER): Payer: Self-pay

## 2013-02-08 ENCOUNTER — Encounter: Payer: Self-pay | Admitting: Interventional Cardiology

## 2013-02-08 ENCOUNTER — Encounter: Payer: Self-pay | Admitting: Cardiology

## 2013-02-08 VITALS — BP 114/56 | HR 52 | Ht 65.5 in | Wt 186.0 lb

## 2013-02-08 DIAGNOSIS — I739 Peripheral vascular disease, unspecified: Secondary | ICD-10-CM

## 2013-02-08 DIAGNOSIS — N189 Chronic kidney disease, unspecified: Secondary | ICD-10-CM

## 2013-02-08 DIAGNOSIS — I10 Essential (primary) hypertension: Secondary | ICD-10-CM

## 2013-02-08 LAB — BASIC METABOLIC PANEL
BUN: 36 mg/dL — AB (ref 6–23)
CALCIUM: 8.9 mg/dL (ref 8.4–10.5)
CO2: 24 mEq/L (ref 19–32)
Chloride: 110 mEq/L (ref 96–112)
Creatinine, Ser: 1.9 mg/dL — ABNORMAL HIGH (ref 0.4–1.2)
GFR: 27.44 mL/min — AB (ref >60.00)
Glucose, Bld: 98 mg/dL (ref 70–99)
Potassium: 4.7 mEq/L (ref 3.5–5.1)
Sodium: 142 mEq/L (ref 135–145)

## 2013-02-08 NOTE — Patient Instructions (Addendum)
Your physician wants you to follow-up in: 4 months with Dr. Irish Lack. You will receive a reminder letter in the mail two months in advance. If you don't receive a letter, please call our office to schedule the follow-up appointment.  Your physician recommends that you continue on your current medications as directed. Please refer to the Current Medication list given to you today.  Your physician has requested that you have a lower or upper extremity arterial duplex IN MID FEB 2015. This test is an ultrasound of the arteries in the legs or arms. It looks at arterial blood flow in the legs and arms. Allow one hour for Lower and Upper Arterial scans. There are no restrictions or special instructions

## 2013-02-08 NOTE — Progress Notes (Signed)
Patient ID: Christine Santos, female   DOB: August 16, 1945, 68 y.o.   MRN: [id number] . Patient ID: Christine Santos, female   DOB: 07-27-1945, 68 y.o.   MRN: [id number]    [place], [address] [place], [place]  [address] Phone: [phone] Fax:  [phone]  Date:  02/08/2013   ID:  Christine Santos, Christine Santos December 15, 1945, MRN [id number]  PCP:  Zigmund Gottron, MD      History of Present Illness: Christine Santos is a 68 y.o. female who had bilateral LE revascularization. The right leg was treated with PTA and the left SFA was treated with rotational atherectomy a few years ago. She  had some right leg claudication. She has some cramps in her legs at night. She is most limited by foot pain. Knee pain is better.  she underwent angiography of her lower extremities. The right leg was treated with PTA and the left SFA was treated with rotational atherectomy in December 2014. She has no claudication since the procedure. She has some cramps in her legs at night. She is most limited by foot pain. No bleeding problems at her left groin where the entry site was. She is not exercising very regularly.     Wt Readings from Last 3 Encounters:  02/08/13 186 lb (84.369 kg)  01/23/13 185 lb 6.5 oz (84.1 kg)  01/23/13 185 lb 6.5 oz (84.1 kg)     Past Medical History  Diagnosis Date  . CLL (chronic lymphoblastic leukemia)   . Hyperlipidemia   . Hypertension   . Diastolic heart failure   . CAD (coronary artery disease)     s/p CABG in 1999  . Peripheral vascular disease   . Heart murmur   . Myocardial infarction     "I had one but didn't know it" (01/22/2013)  . PAD (peripheral artery disease)   . IDDM (insulin dependent diabetes mellitus)   . Anemia 08/29/2011  . GERD (gastroesophageal reflux disease)   . H/O hiatal hernia   . Arthritis     "legs" (01/22/2013)    Current Outpatient Prescriptions  Medication Sig Dispense Refill  . allopurinol (ZYLOPRIM) 300 MG tablet Take 150 mg by mouth daily.       . Ascorbic Acid (VITAMIN C) 100 MG tablet Take 100 mg by mouth daily.      Marland Kitchen aspirin 81 MG tablet Take 81 mg by mouth daily.      Marland Kitchen atorvastatin (LIPITOR) 80 MG tablet TAKE 1 TABLET BY MOUTH ONCE DAILY  90 tablet  1  . Choline Fenofibrate (FENOFIBRIC ACID) 45 MG CPDR TAKE ONE CAPSULE BY MOUTH ONCE DAILY  90 capsule  1  . cloNIDine (CATAPRES - DOSED IN MG/24 HR) 0.2 mg/24hr patch Place 0.2 mg onto the skin once a week. On Sunday      . clopidogrel (PLAVIX) 75 MG tablet Take 75 mg by mouth daily with breakfast.      . felodipine (PLENDIL) 10 MG 24 hr tablet Take 20 mg by mouth daily.        Marland Kitchen GNP GARLIC EXTRACT PO Take 1 tablet by mouth daily.        . hydrochlorothiazide (HYDRODIURIL) 25 MG tablet Take 25 mg by mouth daily.      . Insulin Glargine (LANTUS SOLOSTAR) 100 UNIT/ML SOPN Inject 35 Units into the skin daily.  5 pen  12  . lisinopril (PRINIVIL,ZESTRIL) 40 MG tablet Take 40 mg by mouth daily.      Marland Kitchen  Melatonin 5 MG CAPS Take by mouth at bedtime.      . metoprolol tartrate (LOPRESSOR) 25 MG tablet Take 12.5 mg by mouth 2 (two) times daily.      . Multiple Vitamins-Minerals (MULTIVITAMINS THER. W/MINERALS) TABS Take 1 tablet by mouth daily.        . Omega-3 Fatty Acids (FISH OIL) 1000 MG CAPS Take 2,000 mg by mouth 2 (two) times daily.       Marland Kitchen OVER THE COUNTER MEDICATION Take 1 tablet by mouth daily. Macular Degenerative Eye Supplement       . pantoprazole (PROTONIX) 40 MG tablet Take 40 mg by mouth daily.      Marland Kitchen PARoxetine (PAXIL) 20 MG tablet Take 20 mg by mouth daily.      . traMADol (ULTRAM) 50 MG tablet Take 1 tablet (50 mg total) by mouth 4 (four) times daily.  360 tablet  1   No current facility-administered medications for this visit.    Allergies:    Allergies  Allergen Reactions  . Amoxicillin Rash    REACTION: rash  . Cephalexin Rash  . Codeine Phosphate Rash    REACTION: rash    Social History:  The patient  reports that she has never smoked. She has never used  smokeless tobacco. She reports that she does not drink alcohol or use illicit drugs.   Family History:  The patient's family history includes Heart disease in her mother; Pneumonia in her father.   ROS:  Please see the history of present illness.  No nausea, vomiting.  No fevers, chills.  No focal weakness.  No dysuria. Leg pain as noted.  All other systems reviewed and negative.   PHYSICAL EXAM: VS:  BP 114/56  Pulse 52  Ht 5' 5.5" (1.664 m)  Wt 186 lb (84.369 kg)  BMI 30.47 kg/m2  SpO2 96% Well nourished, well developed, in no acute distress HEENT: normal Neck: no JVD, no carotid bruits Cardiac:  normal S1, S2; RRR;  Lungs:  clear to auscultation bilaterally, no wheezing, rhonchi or rales Abd: soft, nontender, no hepatomegaly Ext: no edema; 2+ left PT pulse; 2+ right DP pulse; no left groin hematoma Skin: warm and dry Neuro:   no focal abnormalities noted  EKG:  Sinus bradycardia, T wave inversion laterally.  No change.     ASSESSMENT AND PLAN:  Coronary atherosclerosis of native coronary artery  Continue Aspirin 325 mg tablet, 325 mg, 1/2 tablet, orally, daily Continue Plavix Tablet, 75 MG, 1 tablet, Once a day Continue Metoprolol Tartrate Tablet, 25 MG, 1/2 tab, Orally, Twice a day Notes: No angina. Stress test negative in 2012 for ischemia.  Anterior scar. EF 58%. s/p CABG in 1996.  2. Hypertension, essential  Continue Lisinopril Tablet, 40 mg, 1 tablet, Orally, Once a day Continue HCTZ 25 MG Tablet, 25 MG, 1 tablet, Orally, q AM Continue Clonidine HCl Patch Weekly, 0.2 MG/24HR, 1 patch, Transdermal Continue Felodipine Miscellaneous Unspecified, 10 Milligram, TAKE 2 TABLETS BY MOUTH DAILY Notes: Controlled at other offices. Controlled reading today. 3. Mixed hyperlipidemia  Continue Atorvastatin Calcium Tablet, 40.0 Milligram, TAKE 1 TABLET BY MOUTH ONCE A DAY Notes: LDL 84. TG controlled in 4/14. In 8/14, LDL 76 4. Atherosclerosis of native arteries of the extremities  with intermittent claudication  Notes: Claudication resolved. right SFA disease treated in 12/14 with PTA/rotational atherectomy. Watch for claudication.Will check LE arterial Doppler.  Plan for routine u/s.  See what Cr is.    I encouraged her to  try to exercise on a regular basis. At least, 5 days a week 30 minutes a day.   Signed, Mina Marble, MD, [facility] 02/08/2013 9:32 AM

## 2013-02-22 ENCOUNTER — Encounter: Payer: Self-pay | Admitting: Internal Medicine

## 2013-02-22 ENCOUNTER — Telehealth: Payer: Self-pay | Admitting: Internal Medicine

## 2013-02-22 ENCOUNTER — Ambulatory Visit (HOSPITAL_BASED_OUTPATIENT_CLINIC_OR_DEPARTMENT_OTHER): Payer: Medicare Other | Admitting: Internal Medicine

## 2013-02-22 ENCOUNTER — Ambulatory Visit (HOSPITAL_COMMUNITY)
Admission: RE | Admit: 2013-02-22 | Discharge: 2013-02-22 | Disposition: A | Discharge status: Home / Self Care | Payer: Medicare Other | Source: Ambulatory Visit | Attending: Internal Medicine | Admitting: Internal Medicine

## 2013-02-22 ENCOUNTER — Other Ambulatory Visit (HOSPITAL_BASED_OUTPATIENT_CLINIC_OR_DEPARTMENT_OTHER): Payer: Medicare Other

## 2013-02-22 VITALS — BP 149/70 | HR 66 | Temp 96.8°F | Resp 18 | Ht 65.5 in | Wt 187.7 lb

## 2013-02-22 DIAGNOSIS — C9110 Chronic lymphocytic leukemia of B-cell type not having achieved remission: Secondary | ICD-10-CM

## 2013-02-22 DIAGNOSIS — M7989 Other specified soft tissue disorders: Secondary | ICD-10-CM

## 2013-02-22 DIAGNOSIS — I129 Hypertensive chronic kidney disease with stage 1 through stage 4 chronic kidney disease, or unspecified chronic kidney disease: Secondary | ICD-10-CM

## 2013-02-22 DIAGNOSIS — E119 Type 2 diabetes mellitus without complications: Secondary | ICD-10-CM

## 2013-02-22 DIAGNOSIS — R609 Edema, unspecified: Secondary | ICD-10-CM | POA: Insufficient documentation

## 2013-02-22 DIAGNOSIS — K089 Disorder of teeth and supporting structures, unspecified: Secondary | ICD-10-CM

## 2013-02-22 DIAGNOSIS — E785 Hyperlipidemia, unspecified: Secondary | ICD-10-CM

## 2013-02-22 DIAGNOSIS — I724 Aneurysm of artery of lower extremity: Secondary | ICD-10-CM

## 2013-02-22 DIAGNOSIS — D649 Anemia, unspecified: Secondary | ICD-10-CM

## 2013-02-22 LAB — CBC WITH DIFFERENTIAL/PLATELET
BASO%: 0.3 % (ref 0.0–2.0)
Basophils Absolute: 0.2 10e3/uL — ABNORMAL HIGH (ref 0.0–0.1)
EOS%: 0.5 % (ref 0.0–7.0)
Eosinophils Absolute: 0.2 10e3/uL (ref 0.0–0.5)
HCT: 33.4 % — ABNORMAL LOW (ref 34.8–46.6)
HGB: 10.5 g/dL — ABNORMAL LOW (ref 11.6–15.9)
LYMPH%: 84.2 % — ABNORMAL HIGH (ref 14.0–49.7)
MCH: 30.1 pg (ref 25.1–34.0)
MCHC: 31.3 g/dL — ABNORMAL LOW (ref 31.5–36.0)
MCV: 96.2 fL (ref 79.5–101.0)
MONO#: 1.3 10e3/uL — ABNORMAL HIGH (ref 0.1–0.9)
MONO%: 2.6 % (ref 0.0–14.0)
NEUT#: 6.1 10e3/uL (ref 1.5–6.5)
NEUT%: 12.4 % — ABNORMAL LOW (ref 38.4–76.8)
Platelets: 299 10e3/uL (ref 145–400)
RBC: 3.47 10e6/uL — ABNORMAL LOW (ref 3.70–5.45)
RDW: 16.7 % — ABNORMAL HIGH (ref 11.2–14.5)
WBC: 49.6 10e3/uL — ABNORMAL HIGH (ref 3.9–10.3)
lymph#: 41.8 10e3/uL — ABNORMAL HIGH (ref 0.9–3.3)

## 2013-02-22 LAB — COMPREHENSIVE METABOLIC PANEL (CC13)
ALT: 20 U/L (ref 0–55)
AST: 20 U/L (ref 5–34)
Albumin: 3.5 g/dL (ref 3.5–5.0)
Alkaline Phosphatase: 90 U/L (ref 40–150)
Anion Gap: 12 mEq/L — ABNORMAL HIGH (ref 3–11)
BUN: 40.5 mg/dL — ABNORMAL HIGH (ref 7.0–26.0)
CHLORIDE: 110 meq/L — AB (ref 98–109)
CO2: 20 mEq/L — ABNORMAL LOW (ref 22–29)
CREATININE: 2.2 mg/dL — AB (ref 0.6–1.1)
Calcium: 8.6 mg/dL (ref 8.4–10.4)
Glucose: 118 mg/dl (ref 70–140)
Potassium: 5 mEq/L (ref 3.5–5.1)
SODIUM: 142 meq/L (ref 136–145)
TOTAL PROTEIN: 6.1 g/dL — AB (ref 6.4–8.3)
Total Bilirubin: 0.42 mg/dL (ref 0.20–1.20)

## 2013-02-22 LAB — TECHNOLOGIST REVIEW

## 2013-02-22 NOTE — Patient Instructions (Signed)
Chronic Lymphocytic Leukemia Chronic lymphocytic leukemia (CLL) is a type of cancer of the bone marrow and blood cells. Bone marrow is the soft, spongy tissue inside your bone. In CLL, the bone marrow makes too many white blood cells that usually fight infection in the body (lymphocytes). CLL usually gets worse slowly and is the most common type of adult leukemia.  RISK FACTORS No one knows the exact cause of CLL. There is a higher risk of CLL in people who:   Are older than 50 years.  Are white.  Are female.  Have a family history of CLL or other cancers of the lymph system.  Are of Russian Jewish or Eastern European Jewish descent.  Have been exposed to certain chemicals, such as Agent Orange (used in the Vietnam War) or other herbicides or insecticides. SYMPTOMS  At first, there may be no symptoms of chronic lymphocytic leukemia. After a while, some symptoms may occur, such as:   Feeling more tired than usual, even after rest.  Unplanned weight loss.  Heavy sweating at night.  Fevers.  Shortness of breath.  Decreased energy.  Paleness.  Painless, swollen lymph nodes.  A feeling of fullness in the upper left part of the abdomen.  Easy bruising or bleeding.  More frequent infections. DIAGNOSIS  Your health care provider may perform the following exams and tests to diagnose CLL:  Physical exam to check for an enlarged spleen, liver, or lymph nodes.  Blood and bone marrow tests to identify the presence of cancer cells. These may include tests such as complete blood count, flow cytometry, immunophenotyping, and fluorescence in situ hybridization (FISH).  CT scan to look for swelling or abnormalities in your spleen, liver, and lymph nodes. TREATMENT  Treatment options for CLL depend on the stage and the presence of symptoms. There are a number of types of treatment used for this condition, including:  Observation.  Targeted drugs. These are drugs that interfere with  chemicals that leukemia cells need in order to grow and multiply. They identify and attack specific cancer cells without harming normal cells.  Chemotherapy drugs. These medicines kill cells that are multiplying quickly, such as leukemia cells.  Radiation.  Surgery to remove the spleen.  Biological therapy. This treatment boosts the ability of your own immune system to fight the leukemia cells.  Bone marrow or peripheral blood stem cell transplant. This treatment allows the patient to receive very high doses of chemotherapy and/or radiation. These high doses kill the cancer cells but also destroy the bone marrow. After treatment is complete, you are given donor bone marrow or stem cells, which will replace the bone marrow. HOME CARE INSTRUCTIONS   Because you have an increased risk of infection, practice good hand washing and avoid being around people who are ill or being in crowded places.  Because you have an increased risk of bleeding and bruising, avoid contact sports or other rough activities.  Only take over-the-counter or prescription medicines for pain, discomfort, or fever as directed by your health care provider.  Although some of your treatments might affect your appetite, try to eat regular, healthy meals.  If you develop any side effects, such as nausea, diarrhea, rash, white patches in your mouth, a sore throat, difficulty swallowing, or severe fatigue, tell your health care provider. He or she may have recommendations of things you can do to improve symptoms.  Consider learning some ways to cope with the stress of having a chronic illness, such as yoga, meditation,   or participating in a support group. SEEK MEDICAL CARE IF:  You develop chest pains.  You notice pain, swelling or redness anywhere in your legs.  You have pain in your belly (abdomen).  You develop new bruises that are getting bigger.  You have painful or more swollen lymph nodes.  You develop bleeding  from your gums, nose, or in your urine or stools.  You are unable to stop throwing up (vomiting).  You cannot keep liquids down.  You feel lightheaded.  You have a fever or persistent symptoms for more than 2 3 days.  You develop a severe stiff neck or headache. SEEK IMMEDIATE MEDICAL CARE IF:  You have trouble breathing or feel short of breath.  You faint. Document Released: 05/29/2008 Document Revised: 09/12/2012 Document Reviewed: 07/05/2012 [employer] Patient Information 2014 [employer].  

## 2013-02-22 NOTE — Telephone Encounter (Signed)
Gave pt appt fotr lab and MD on May 2015 and labs on March 2015 sent pt to doppler

## 2013-02-22 NOTE — Progress Notes (Signed)
[place], MD [address] [place] [address]  DIAGNOSIS: CLL  Anemia  Chief Complaint  Patient presents with  . CLL    CURRENT THERAPY:  Active observation.  PAST THERAPY: Monthly chlorabucil [place] January 2011 in January 2012  INTERVAL HISTORY: Christine Santos 68 y.o. 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 He was last seen by me on 10/23/2012. Today she is accompanied by her significant other Christine Santos and her sister Christine Santos. She reports that her fatigue has improved significantly since her last visit. She reports a normal appetite and denies weight loss. She denies vomiting, diarrhea. She denies fever, anorexia, weight loss, headache, visual changes, confusion, drenching night sweats, palpable lymph node swelling, mucositis, or odynophagia, dysphagia, nausea, jaundice, chest pain, palpitations, dyspnea, dyspnea on exertion, productive cough, bleeding, offices, abdominal pain, early satiety, melena, hematochezia, hematuria, weakness. He reports having her colonoscopy done a few months ago.  She has received the pneumococcal vaccination. She does report an arterial procedure of her right lower extremity and subsequent swelling.   MEDICAL HISTORY: Past Medical History  Diagnosis Date  . CLL (chronic lymphoblastic leukemia)   . Hyperlipidemia   . Hypertension   . Diastolic heart failure   . CAD (coronary artery disease)     s/p CABG in 1999  . Peripheral vascular disease   . Heart murmur   . Myocardial infarction     "I had one but didn't know it" (01/22/2013)  . PAD (peripheral artery disease)   . IDDM (insulin dependent diabetes mellitus)   . Anemia 08/29/2011  . GERD (gastroesophageal reflux disease)   . H/O hiatal hernia   . Arthritis     "legs" (01/22/2013)    INTERIM  HISTORY: has CLL; DIABETES MELLITUS, II, COMPLICATIONS; HYPERCHOLESTEROLEMIA; OBESITY, NOS; HYPERTENSION, BENIGN SYSTEMIC; CORONARY, ARTERIOSCLEROSIS; SINUS BRADYCARDIA; Chronic systolic heart failure; PERIPHERAL VASCULAR DISEASE; REFLUX ESOPHAGITIS; IRRITABLE BOWEL SYNDROME; Chronic kidney disease, stage III (moderate); CYSTOCELE/RECTOCELE/PROLAPSE,UNSPEC.; Osteoarthritis, multiple sites; Anxiety; Stress incontinence, female; Gout; Anemia; Foot pain; Osteopenia; Diabetic retinopathy; Macular degeneration; Diabetic neuropathy; and Chronic kidney disease, unspecified on her problem list.    ALLERGIES:  is allergic to amoxicillin; cephalexin; and codeine phosphate.  MEDICATIONS: has a current medication list which includes the following prescription(s): allopurinol, vitamin c, aspirin, atorvastatin, fenofibric acid, clonidine, clopidogrel, felodipine, garlic, hydrochlorothiazide, insulin glargine, lisinopril, melatonin, metoprolol tartrate, multivitamins ther. w/minerals, fish oil, OVER THE COUNTER MEDICATION, pantoprazole, paroxetine, and tramadol.  SURGICAL HISTORY:  Past Surgical History  Procedure Laterality Date  . Cataract extraction w/ intraocular lens  implant, bilateral Bilateral ?2011  . Heel spur excision Bilateral 1970's  . Cholecystectomy  1993  . Tubal ligation  1980's  . Cardiac catheterization      "I've had 2-4" (01/22/2013)  . Coronary artery bypass graft  10/1994    "CABG X3"  . Shoulder open rotator cuff repair Bilateral 1990's    "2 times on 1 side; once on the other"  . Angioplasty / stenting iliac      [name] 11/25/2010 (01/22/2013)  . Angioplasty / stenting femoral Right 09/2010    SFA/notes 11/25/2010 (01/22/2013)  . Angioplasty / stenting femoral Left 11/2010    SFA/notes 11/25/2010 (01/22/2013)  . Transluminal atherectomy femoral artery Right 01/22/2013    & balloon    REVIEW OF SYSTEMS:   Constitutional: Denies fevers, chills or  abnormal weight loss Eyes: Denies  blurriness of vision Ears, nose, mouth, throat, and face: Denies mucositis or sore throat Respiratory: Denies cough, dyspnea or wheezes Cardiovascular: Denies palpitation, chest discomfort or lower extremity swelling Gastrointestinal:  Denies nausea, heartburn or change in bowel habits Skin: Denies abnormal skin rashes Lymphatics: Denies new lymphadenopathy or easy bruising Neurological:Denies numbness, tingling or new weaknesses Behavioral/Psych: Mood is stable, no new changes  All other systems were reviewed with the patient and are negative.  PHYSICAL EXAMINATION: ECOG PERFORMANCE STATUS: 1 - Symptomatic but completely ambulatory  Blood pressure 149/70, pulse 66, temperature 96.8 F (36 C), temperature source Oral, resp. rate 18, height 5' 5.5" (1.664 m), weight 187 lb 11.2 oz (85.14 kg).  GENERAL:alert, no distress and comfortable anxious SKIN: skin color, texture, turgor are normal, no rashes or significant lesions EYES: normal, Conjunctiva are pink and non-injected, sclera clear OROPHARYNX:no exudate, no erythema and lips, buccal mucosa, and tongue normal  NECK: supple, thyroid normal size, non-tender, without nodularity LYMPH:  no palpable lymphadenopathy in the cervical, axillary or supraclavicular LUNGS: clear to auscultation and percussion with normal breathing effort HEART: regular rate & rhythm and no murmurs an 1+ right lower extremity versus trace edema ABDOMEN:abdomen soft, non-tender and normal bowel sounds Musculoskeletal:no cyanosis of digits and no clubbing  NEURO: alert & oriented x 3 with fluent speech, no focal motor/sensory deficits   LABORATORY DATA: Results for orders placed in visit on 02/22/13 (from the past 48 hour(s))  CBC WITH DIFFERENTIAL     Status: Abnormal   Collection Time    02/22/13  1:00 PM      Result Value Range   WBC 49.6 (*) 3.9 - 10.3 10e3/uL   NEUT# 6.1  1.5 - 6.5 10e3/uL   HGB 10.5 (*) 11.6 - 15.9 g/dL   HCT 33.4 (*) 34.8 - 46.6 %    Platelets 299  145 - 400 10e3/uL   MCV 96.2  79.5 - 101.0 fL   MCH 30.1  25.1 - 34.0 pg   MCHC 31.3 (*) 31.5 - 36.0 g/dL   RBC 3.47 (*) 3.70 - 5.45 10e6/uL   RDW 16.7 (*) 11.2 - 14.5 %   lymph# 41.8 (*) 0.9 - 3.3 10e3/uL   MONO# 1.3 (*) 0.1 - 0.9 10e3/uL   Eosinophils Absolute 0.2  0.0 - 0.5 10e3/uL   Basophils Absolute 0.2 (*) 0.0 - 0.1 10e3/uL   NEUT% 12.4 (*) 38.4 - 76.8 %   LYMPH% 84.2 (*) 14.0 - 49.7 %   MONO% 2.6  0.0 - 14.0 %   EOS% 0.5  0.0 - 7.0 %   BASO% 0.3  0.0 - 2.0 %       Labs:  Lab Results  Component Value Date   WBC 49.6* 02/22/2013   HGB 10.5* 02/22/2013   HCT 33.4* 02/22/2013   MCV 96.2 02/22/2013   PLT 299 02/22/2013   NEUTROABS 6.1 02/22/2013      Chemistry      Component Value Date/Time   NA 142 02/08/2013 0854   NA 143 10/23/2012 1237   K 4.7 02/08/2013 0854   K 5.0 10/23/2012 1237   CL 110 02/08/2013 0854   CL 110* 05/28/2012 1259   CO2 24 02/08/2013 0854   CO2 22 10/23/2012 1237   BUN 36* 02/08/2013 0854   BUN 44.6* 10/23/2012 1237   CREATININE 1.9* 02/08/2013 0854   CREATININE 1.8* 10/23/2012 1237   CREATININE 1.63* 09/08/2010 0851      Component Value Date/Time  CALCIUM 8.9 02/08/2013 0854   CALCIUM 9.0 10/23/2012 1237   ALKPHOS 108 10/23/2012 1237   ALKPHOS 79 08/29/2011 1335   AST 19 10/23/2012 1237   AST 16 08/29/2011 1335   ALT 15 10/23/2012 1237   ALT 16 08/29/2011 1335   BILITOT 0.35 10/23/2012 1237   BILITOT 0.3 08/29/2011 1335      RADIOGRAPHIC STUDIES: 02/22/2013  Bilateral Lower exrtemity duplex: Summary:  - No evidence of deep vein thrombosis involving the right lower extremity. - No evidence of deep vein thrombosis involving the left lower extremity.    ASSESSMENT: Christine Santos 68 y.o. female with a history of CLL  Anemia   PLAN:  #1 CLL. Her B. symptoms and lymphocytosis s appear to be stable. Her creatinine is stable since her last visit. On the prior visit, Dr. Heber [place] discussed with the patient the option of proceeding with  salvage chemotherapy with ibrutinib then or waiting to see if she became more symptomatic and/or kidney function decreased. That time she preferred to wait. As there is no absolute indication to resume chemotherapy, we continued observation. Moreover,  given the results of today's lab that shows all improvement in her creatinine and a stable lymphocytosis, the patient agrees to continue active observation. She was provided a handout on CLL that includes symptoms to monitor.  #2 hypertension.  Today her blood pressure was mildly elevated.  She is on hydrochlorothiazide, lisinopril, metoprolol, clonidine patch.  We we advised the patient to continue to check her blood pressure in the setting of her home and to also contact her primary care physician should her blood pressure consistently over140/90. She will continue her low-salt diet.   #3 diabetes mellitus.  Her HbA1c is improved to 7.1.She is on insulin per her primary care physician.  #4 chronic renal insufficiency likely secondary to #2, #3.  Her creatinine today is 1.90.  In the past, she had a renal biopsy which showed CLL. In the future, if there is concern for how to attribute worsening renal function either 2 hypertension, diabetes or progression of CLL, a renal biopsy may be considered.  #5 hyperlipidemia. She is on fish oil and atorvastatin that her primary care physician is managing.  #6 peripheral vascular disease. She is on aspirin and Plavix per cardiologist she had a stent placed in 2012 and recently redone this year.   #7 R leg swollen.   Lower extremity duplex negative for DVT.   #8 followup. Patient instructed to followup in 4 months at that time we will check a CBC, CMP. We will also check a CBC in two months.  All questions were answered. The patient knows to call the clinic with any problems, questions or concerns. We can certainly see the patient much sooner if necessary.  Patient was provided and after visit summary.  I spent  15 minutes counseling the patient face to face. The total time spent in the appointment was 25 minutes.    Deaken Jurgens, MD 02/22/2013 1:39 PM

## 2013-02-22 NOTE — Progress Notes (Signed)
VASCULAR LAB PRELIMINARY  PRELIMINARY  PRELIMINARY  PRELIMINARY  Bilateral lower extremity venous duplex  completed.    Preliminary report:  Bilateral:  No evidence of DVT, superficial thrombosis, or Baker's Cyst.    Omarie Parcell, RVT 02/22/2013, 3:35 PM

## 2013-03-04 ENCOUNTER — Other Ambulatory Visit: Payer: Self-pay | Admitting: Family Medicine

## 2013-03-11 ENCOUNTER — Encounter: Payer: Self-pay | Admitting: Cardiovascular Disease

## 2013-03-11 ENCOUNTER — Ambulatory Visit (HOSPITAL_COMMUNITY): Payer: Medicare Other | Attending: Cardiovascular Disease

## 2013-03-11 ENCOUNTER — Ambulatory Visit (INDEPENDENT_AMBULATORY_CARE_PROVIDER_SITE_OTHER): Payer: Medicare Other | Admitting: *Deleted

## 2013-03-11 DIAGNOSIS — I739 Peripheral vascular disease, unspecified: Secondary | ICD-10-CM | POA: Insufficient documentation

## 2013-03-11 DIAGNOSIS — I10 Essential (primary) hypertension: Secondary | ICD-10-CM | POA: Insufficient documentation

## 2013-03-11 DIAGNOSIS — I2510 Atherosclerotic heart disease of native coronary artery without angina pectoris: Secondary | ICD-10-CM | POA: Insufficient documentation

## 2013-03-11 DIAGNOSIS — C9110 Chronic lymphocytic leukemia of B-cell type not having achieved remission: Secondary | ICD-10-CM

## 2013-03-11 DIAGNOSIS — Z951 Presence of aortocoronary bypass graft: Secondary | ICD-10-CM | POA: Insufficient documentation

## 2013-03-11 DIAGNOSIS — I70209 Unspecified atherosclerosis of native arteries of extremities, unspecified extremity: Secondary | ICD-10-CM | POA: Insufficient documentation

## 2013-03-11 DIAGNOSIS — E785 Hyperlipidemia, unspecified: Secondary | ICD-10-CM | POA: Insufficient documentation

## 2013-03-11 LAB — LIPID PANEL
CHOLESTEROL: 152 mg/dL (ref 0–200)
HDL: 40.9 mg/dL (ref >39.00)
LDL CALC: 85 mg/dL (ref 0–99)
TRIGLYCERIDES: 132 mg/dL (ref 0.0–149.0)
Total CHOL/HDL Ratio: 4
VLDL: 26.4 mg/dL (ref 0.0–40.0)

## 2013-03-11 LAB — ALT: ALT: 18 U/L (ref 0–35)

## 2013-03-15 ENCOUNTER — Telehealth: Payer: Self-pay | Admitting: Cardiology

## 2013-03-15 DIAGNOSIS — E7800 Pure hypercholesterolemia, unspecified: Secondary | ICD-10-CM

## 2013-03-15 NOTE — Telephone Encounter (Signed)
Future cholesterol labs scheduled.

## 2013-03-20 ENCOUNTER — Other Ambulatory Visit: Payer: Self-pay | Admitting: Family Medicine

## 2013-03-20 ENCOUNTER — Other Ambulatory Visit: Payer: Self-pay | Admitting: Interventional Cardiology

## 2013-04-16 ENCOUNTER — Other Ambulatory Visit: Payer: Self-pay

## 2013-04-16 ENCOUNTER — Ambulatory Visit (INDEPENDENT_AMBULATORY_CARE_PROVIDER_SITE_OTHER): Payer: Medicare Other | Admitting: Cardiovascular Disease

## 2013-04-16 ENCOUNTER — Encounter: Payer: Self-pay | Admitting: Cardiovascular Disease

## 2013-04-16 VITALS — BP 157/68 | HR 59 | Ht 65.0 in | Wt 184.0 lb

## 2013-04-16 DIAGNOSIS — I739 Peripheral vascular disease, unspecified: Secondary | ICD-10-CM

## 2013-04-16 NOTE — Patient Instructions (Signed)
Your physician wants you to follow-up in: August 08/2013 with Lower Ext Dopplers 1 week before visit. You will receive a reminder letter in the mail two months in advance. If you don't receive a letter, please call our office to schedule the follow-up appointment.    Continue same medications

## 2013-04-16 NOTE — Progress Notes (Signed)
Date:  04/16/2013   ID:  Christine Santos, DOB 1945/09/16, MRN [id number]  PCP:  Zigmund Gottron, MD  Primary cardiologist: Dr. Irish Lack    History of Present Illness: Christine Santos is a 68 y.o. female who is referred by Dr. Irish Lack to establish care regarding PAD. She has known history of CAD s/p CABG in 1999. She has multiple chronic medical conditions that include diabetes mellitus, hypertension and hyperlipidemia. She has known history of peripheral arterial disease with bilateral lower extremity revascularization. Most recently the right SFA was treated with rotational atherectomy and PTA in December 2014. Previous RSFA stent was patent. Previous LSFA atherectomy site was patent. She reports mild bilateral calf discomfort after walking about 2 blocks. She is more bothered by foot pain both at rest and with activities . she used to get more cramps in her legs at night. She reports improved symptoms over all sense the most recent procedure. She is not a smoker.     Wt Readings from Last 3 Encounters:  02/22/13 187 lb 11.2 oz (85.14 kg)  02/08/13 186 lb (84.369 kg)  01/23/13 185 lb 6.5 oz (84.1 kg)     Past Medical History  Diagnosis Date  . CLL (chronic lymphoblastic leukemia)   . Hyperlipidemia   . Hypertension   . Diastolic heart failure   . CAD (coronary artery disease)     s/p CABG in 1999  . Peripheral vascular disease   . Heart murmur   . Myocardial infarction     "I had one but didn't know it" (01/22/2013)  . PAD (peripheral artery disease)   . IDDM (insulin dependent diabetes mellitus)   . Anemia 08/29/2011  . GERD (gastroesophageal reflux disease)   . H/O hiatal hernia   . Arthritis     "legs" (01/22/2013)    Current Outpatient Prescriptions  Medication Sig Dispense Refill  . ACCU-CHEK FASTCLIX LANCETS MISC USE AS DIRECTED 3 TIMES DAILY  100 each  PRN  . allopurinol (ZYLOPRIM) 300 MG tablet Take 150 mg by mouth daily.      . Ascorbic Acid (VITAMIN C) 100 MG  tablet Take 100 mg by mouth daily.      Marland Kitchen aspirin 81 MG tablet Take 81 mg by mouth daily.      Marland Kitchen atorvastatin (LIPITOR) 80 MG tablet TAKE 1 TABLET BY MOUTH ONCE DAILY  90 tablet  1  . B-D UF III MINI PEN NEEDLES 31G X 5 MM MISC as directed once daily  100 each  PRN  . Choline Fenofibrate (FENOFIBRIC ACID) 45 MG CPDR TAKE ONE CAPSULE BY MOUTH ONCE DAILY  90 capsule  1  . cloNIDine (CATAPRES - DOSED IN MG/24 HR) 0.2 mg/24hr patch Place 0.2 mg onto the skin once a week. On Sunday      . clopidogrel (PLAVIX) 75 MG tablet Take 75 mg by mouth daily with breakfast.      . felodipine (PLENDIL) 10 MG 24 hr tablet TAKE 2 TABLETS BY MOUTH DAILY  180 tablet  0  . GNP GARLIC EXTRACT PO Take 1 tablet by mouth daily.        . hydrochlorothiazide (HYDRODIURIL) 25 MG tablet Take 25 mg by mouth daily.      . Insulin Glargine (LANTUS SOLOSTAR) 100 UNIT/ML SOPN Inject 35 Units into the skin daily.  5 pen  12  . lisinopril (PRINIVIL,ZESTRIL) 40 MG tablet Take 40 mg by mouth daily.      . Melatonin 5 MG CAPS Take by  mouth at bedtime.      . metoprolol tartrate (LOPRESSOR) 25 MG tablet Take 12.5 mg by mouth 2 (two) times daily.      . Multiple Vitamins-Minerals (MULTIVITAMINS THER. W/MINERALS) TABS Take 1 tablet by mouth daily.        . Omega-3 Fatty Acids (FISH OIL) 1000 MG CAPS Take 2,000 mg by mouth 2 (two) times daily.       Marland Kitchen OVER THE COUNTER MEDICATION Take 1 tablet by mouth daily. Macular Degenerative Eye Supplement       . pantoprazole (PROTONIX) 40 MG tablet Take 40 mg by mouth daily.      Marland Kitchen PARoxetine (PAXIL) 20 MG tablet Take 20 mg by mouth daily.      . traMADol (ULTRAM) 50 MG tablet TAKE 1 TABLET BY MOUTH FOUR TIMES DAILY  360 tablet  1   No current facility-administered medications for this visit.    Allergies:    Allergies  Allergen Reactions  . Amoxicillin Rash    REACTION: rash  . Cephalexin Rash  . Codeine Phosphate Rash    REACTION: rash    Social History:  The patient  reports that she  has never smoked. She has never used smokeless tobacco. She reports that she does not drink alcohol or use illicit drugs.   Family History:  The patient's family history includes Heart disease in her mother; Pneumonia in her father.   ROS:  Please see the history of present illness.  No nausea, vomiting.  No fevers, chills.  No focal weakness.  No dysuria. Leg pain as noted.  All other systems reviewed and negative.   PHYSICAL EXAM: VS:  There were no vitals taken for this visit. Well nourished, well developed, in no acute distress HEENT: normal Neck: no JVD, no carotid bruits Cardiac:  normal S1, S2; RRR;  Lungs:  clear to auscultation bilaterally, no wheezing, rhonchi or rales Abd: soft, nontender, no hepatomegaly Ext: no edema; 1+ left PT pulse; 0 and a + right DP pulse; normal femoral pulse Skin: warm and dry Neuro:   no focal abnormalities noted  EKG:  Sinus bradycardia, T wave inversion laterally.  No change.     ASSESSMENT AND PLAN:  1. Coronary atherosclerosis of native coronary artery  stable s/p CABG in 1996.   2. Atherosclerosis of native arteries of the extremities with intermittent claudication   she had atherectomy and balloon angioplasty of the right SFA in December of 2014. Previous atherectomy of the distal left SFA a few years ago. Followup noninvasive evaluation in February showed an ABI of 0.79 and the right and 0.92 on the left side with evidence of significant restenosis in the right SFA. Given that her symptoms are currently not lifestyle limiting, I recommend continuing aggressive medical therapy and a walking program. I requested a followup lower extremity duplex/Doppler in August. I asked her to notify me if symptoms worsen. Treatment options in the future include  drug coated balloons in order to decrease the risk of restenosis.   Signed, Kathlyn Sacramento, MD, [facility] 04/16/2013 8:24 AM

## 2013-04-22 ENCOUNTER — Other Ambulatory Visit (HOSPITAL_BASED_OUTPATIENT_CLINIC_OR_DEPARTMENT_OTHER): Payer: Medicare Other

## 2013-04-22 DIAGNOSIS — D649 Anemia, unspecified: Secondary | ICD-10-CM

## 2013-04-22 DIAGNOSIS — C9110 Chronic lymphocytic leukemia of B-cell type not having achieved remission: Secondary | ICD-10-CM

## 2013-04-22 LAB — CBC WITH DIFFERENTIAL/PLATELET
BASO%: 0.1 % (ref 0.0–2.0)
Basophils Absolute: 0.1 10*3/uL (ref 0.0–0.1)
EOS%: 0.8 % (ref 0.0–7.0)
Eosinophils Absolute: 0.4 10*3/uL (ref 0.0–0.5)
HCT: 34.4 % — ABNORMAL LOW (ref 34.8–46.6)
HGB: 10.6 g/dL — ABNORMAL LOW (ref 11.6–15.9)
LYMPH%: 83.9 % — ABNORMAL HIGH (ref 14.0–49.7)
MCH: 28.9 pg (ref 25.1–34.0)
MCHC: 30.7 g/dL — ABNORMAL LOW (ref 31.5–36.0)
MCV: 94.2 fL (ref 79.5–101.0)
MONO#: 1.2 10*3/uL — ABNORMAL HIGH (ref 0.1–0.9)
MONO%: 2.5 % (ref 0.0–14.0)
NEUT#: 5.9 10*3/uL (ref 1.5–6.5)
NEUT%: 12.7 % — ABNORMAL LOW (ref 38.4–76.8)
Platelets: 337 10*3/uL (ref 145–400)
RBC: 3.65 10*6/uL — ABNORMAL LOW (ref 3.70–5.45)
RDW: 17.1 % — AB (ref 11.2–14.5)
WBC: 46.5 10*3/uL — ABNORMAL HIGH (ref 3.9–10.3)
lymph#: 38.9 10*3/uL — ABNORMAL HIGH (ref 0.9–3.3)

## 2013-04-22 LAB — TECHNOLOGIST REVIEW

## 2013-04-26 ENCOUNTER — Other Ambulatory Visit: Payer: Self-pay

## 2013-04-26 DIAGNOSIS — C9110 Chronic lymphocytic leukemia of B-cell type not having achieved remission: Secondary | ICD-10-CM

## 2013-04-26 NOTE — Progress Notes (Signed)
Pt called requesting lab results from 5/30. She and her husband expressed regret and missing Dr Lamonte Sakai "He always called [place] back with the results". Confirmed CBC and CMET at next lab visit.

## 2013-04-29 ENCOUNTER — Other Ambulatory Visit: Payer: Self-pay | Admitting: Family Medicine

## 2013-05-21 ENCOUNTER — Other Ambulatory Visit: Payer: Self-pay | Admitting: Family Medicine

## 2013-05-22 ENCOUNTER — Ambulatory Visit (INDEPENDENT_AMBULATORY_CARE_PROVIDER_SITE_OTHER): Payer: Medicare Other | Admitting: Family Medicine

## 2013-05-22 ENCOUNTER — Encounter: Payer: Self-pay | Admitting: Family Medicine

## 2013-05-22 VITALS — BP 143/57 | HR 72 | Temp 98.2°F | Ht 65.0 in | Wt 181.0 lb

## 2013-05-22 DIAGNOSIS — E669 Obesity, unspecified: Secondary | ICD-10-CM

## 2013-05-22 DIAGNOSIS — E1142 Type 2 diabetes mellitus with diabetic polyneuropathy: Secondary | ICD-10-CM

## 2013-05-22 DIAGNOSIS — I5022 Chronic systolic (congestive) heart failure: Secondary | ICD-10-CM

## 2013-05-22 DIAGNOSIS — N058 Unspecified nephritic syndrome with other morphologic changes: Secondary | ICD-10-CM

## 2013-05-22 DIAGNOSIS — E1165 Type 2 diabetes mellitus with hyperglycemia: Secondary | ICD-10-CM

## 2013-05-22 DIAGNOSIS — IMO0002 Reserved for concepts with insufficient information to code with codable children: Secondary | ICD-10-CM

## 2013-05-22 DIAGNOSIS — E118 Type 2 diabetes mellitus with unspecified complications: Principal | ICD-10-CM

## 2013-05-22 DIAGNOSIS — E1149 Type 2 diabetes mellitus with other diabetic neurological complication: Secondary | ICD-10-CM

## 2013-05-22 DIAGNOSIS — E1129 Type 2 diabetes mellitus with other diabetic kidney complication: Secondary | ICD-10-CM

## 2013-05-22 DIAGNOSIS — N183 Chronic kidney disease, stage 3 unspecified: Secondary | ICD-10-CM

## 2013-05-22 DIAGNOSIS — D649 Anemia, unspecified: Secondary | ICD-10-CM

## 2013-05-22 DIAGNOSIS — E1140 Type 2 diabetes mellitus with diabetic neuropathy, unspecified: Secondary | ICD-10-CM

## 2013-05-22 DIAGNOSIS — E1121 Type 2 diabetes mellitus with diabetic nephropathy: Secondary | ICD-10-CM

## 2013-05-22 LAB — IRON: IRON: 85 ug/dL (ref 42–145)

## 2013-05-22 LAB — BASIC METABOLIC PANEL
BUN: 52 mg/dL — AB (ref 6–23)
CHLORIDE: 106 meq/L (ref 96–112)
CO2: 23 mEq/L (ref 19–32)
Calcium: 9.3 mg/dL (ref 8.4–10.5)
Creat: 2.14 mg/dL — ABNORMAL HIGH (ref 0.50–1.10)
Glucose, Bld: 129 mg/dL — ABNORMAL HIGH (ref 70–99)
POTASSIUM: 5.4 meq/L — AB (ref 3.5–5.3)
Sodium: 140 mEq/L (ref 135–145)

## 2013-05-22 LAB — IBC PANEL
%SAT: 18 % — ABNORMAL LOW (ref 20–55)
TIBC: 475 ug/dL — AB (ref 250–470)
UIBC: 390 ug/dL (ref 125–400)

## 2013-05-22 LAB — POCT GLYCOSYLATED HEMOGLOBIN (HGB A1C): Hemoglobin A1C: 7.5

## 2013-05-22 NOTE — Patient Instructions (Signed)
I will call with blood work results.  Check your blood sugar after meals for a couple of weeks and call me.  We may need to add a short acting insulin to cover meals.

## 2013-05-23 DIAGNOSIS — E1121 Type 2 diabetes mellitus with diabetic nephropathy: Secondary | ICD-10-CM | POA: Insufficient documentation

## 2013-05-23 MED ORDER — FERROUS SULFATE 325 (65 FE) MG PO TABS: 325.0000 mg | ORAL_TABLET | Freq: Every day | ORAL | Status: DC

## 2013-05-23 NOTE — Assessment & Plan Note (Signed)
Likely needs meal coverage.  Will check post prandial blood sugars.

## 2013-05-23 NOTE — Progress Notes (Signed)
   Subjective:    Patient ID: Christine Santos, female    DOB: Feb 26, 1945, 68 y.o.   MRN: [id number]  HPI  Has been feeling well.  FBS have all been under good control - usually below 120.  We are both disappointed that her AIC is 7.2.  We discussed and she may be having post prandial hyperglycemia and need meal coverage.  She is motivated to achieve tight coverage because of her known CKD.    CKD, needs recheck  CHF, asymptomatic.  Needs iron studies.    Review of Systems     Objective:   Physical ExamLungs clear Cardiac RRR without m Abd benign. Diabetic foot exam normal except decreased sensation from know diabetic neuropathy.       Assessment & Plan:

## 2013-05-23 NOTE — Assessment & Plan Note (Signed)
Labs show mild iron deficiency (slightly low percent sat.)  Start on iron.

## 2013-05-23 NOTE — Assessment & Plan Note (Signed)
Confirmed on diabetic foot exam.

## 2013-05-23 NOTE — Assessment & Plan Note (Signed)
Stable on recheck.

## 2013-05-23 NOTE — Assessment & Plan Note (Signed)
Pleased that weight is slightly down today.  Keep up the good work

## 2013-05-23 NOTE — Assessment & Plan Note (Signed)
Stable on lab

## 2013-06-12 ENCOUNTER — Telehealth: Payer: Self-pay | Admitting: Family Medicine

## 2013-06-12 DIAGNOSIS — IMO0002 Reserved for concepts with insufficient information to code with codable children: Secondary | ICD-10-CM

## 2013-06-12 DIAGNOSIS — E118 Type 2 diabetes mellitus with unspecified complications: Principal | ICD-10-CM

## 2013-06-12 DIAGNOSIS — E1165 Type 2 diabetes mellitus with hyperglycemia: Secondary | ICD-10-CM

## 2013-06-12 NOTE — Telephone Encounter (Signed)
Pt was told to call Dr. Andria Frames in 2 weeks from her last visit. She said it was to discuss her results after she eats. jw

## 2013-06-13 MED ORDER — INSULIN REGULAR HUMAN (CONC) 500 UNIT/ML ~~LOC~~ SOLN: 5.0000 [IU] | Freq: Three times a day (TID) | SUBCUTANEOUS | Status: DC

## 2013-06-13 NOTE — Assessment & Plan Note (Signed)
Called and now has documented post prandial hyperglycemia with BS>250.  Will start mealtime coverage.

## 2013-06-19 ENCOUNTER — Telehealth: Payer: Self-pay | Admitting: Family Medicine

## 2013-06-19 NOTE — Telephone Encounter (Signed)
The fast acting insulin she got is not in the pen. She needs some needles Please advise

## 2013-06-19 NOTE — Telephone Encounter (Signed)
Spoke with patient. She stated that she was told on Thursday that fast acting insulin was going to be called in for her. Patient needs needle and syringes for the insulin. She has never taken anything but the pen and was wondering if the fast acting insulin come in the pens.

## 2013-06-20 MED ORDER — INSULIN ASPART 100 UNIT/ML CARTRIDGE (PENFILL): 5.0000 [IU] | Freq: Three times a day (TID) | SUBCUTANEOUS | Status: DC

## 2013-06-20 NOTE — Telephone Encounter (Signed)
Called and discussed.  Per patient request, will switch to short acting insulin in pen.

## 2013-06-20 NOTE — Telephone Encounter (Signed)
Patient calls again today, never heard back yesterday. Please call patient today.

## 2013-06-21 ENCOUNTER — Telehealth: Payer: Self-pay | Admitting: *Deleted

## 2013-06-21 ENCOUNTER — Telehealth: Payer: Self-pay | Admitting: Internal Medicine

## 2013-06-21 ENCOUNTER — Ambulatory Visit (HOSPITAL_BASED_OUTPATIENT_CLINIC_OR_DEPARTMENT_OTHER): Payer: Medicare Other | Admitting: Internal Medicine

## 2013-06-21 ENCOUNTER — Other Ambulatory Visit (HOSPITAL_BASED_OUTPATIENT_CLINIC_OR_DEPARTMENT_OTHER): Payer: Medicare Other

## 2013-06-21 VITALS — BP 152/54 | HR 61 | Temp 97.8°F | Resp 18 | Ht 65.0 in | Wt 183.7 lb

## 2013-06-21 DIAGNOSIS — E785 Hyperlipidemia, unspecified: Secondary | ICD-10-CM

## 2013-06-21 DIAGNOSIS — I129 Hypertensive chronic kidney disease with stage 1 through stage 4 chronic kidney disease, or unspecified chronic kidney disease: Secondary | ICD-10-CM

## 2013-06-21 DIAGNOSIS — C9110 Chronic lymphocytic leukemia of B-cell type not having achieved remission: Secondary | ICD-10-CM

## 2013-06-21 DIAGNOSIS — E119 Type 2 diabetes mellitus without complications: Secondary | ICD-10-CM

## 2013-06-21 DIAGNOSIS — N189 Chronic kidney disease, unspecified: Secondary | ICD-10-CM

## 2013-06-21 DIAGNOSIS — E1121 Type 2 diabetes mellitus with diabetic nephropathy: Secondary | ICD-10-CM

## 2013-06-21 DIAGNOSIS — E875 Hyperkalemia: Secondary | ICD-10-CM

## 2013-06-21 DIAGNOSIS — I739 Peripheral vascular disease, unspecified: Secondary | ICD-10-CM

## 2013-06-21 LAB — COMPREHENSIVE METABOLIC PANEL (CC13)
ALBUMIN: 3.5 g/dL (ref 3.5–5.0)
ALT: 18 U/L (ref 0–55)
AST: 20 U/L (ref 5–34)
Alkaline Phosphatase: 87 U/L (ref 40–150)
Anion Gap: 12 mEq/L — ABNORMAL HIGH (ref 3–11)
BUN: 51.3 mg/dL — ABNORMAL HIGH (ref 7.0–26.0)
CALCIUM: 9.2 mg/dL (ref 8.4–10.4)
CO2: 19 meq/L — AB (ref 22–29)
CREATININE: 2.2 mg/dL — AB (ref 0.6–1.1)
Chloride: 112 mEq/L — ABNORMAL HIGH (ref 98–109)
Glucose: 110 mg/dl (ref 70–140)
POTASSIUM: 5.5 meq/L — AB (ref 3.5–5.1)
SODIUM: 143 meq/L (ref 136–145)
TOTAL PROTEIN: 6.1 g/dL — AB (ref 6.4–8.3)
Total Bilirubin: 0.41 mg/dL (ref 0.20–1.20)

## 2013-06-21 LAB — CBC WITH DIFFERENTIAL/PLATELET
BASO%: 0.4 % (ref 0.0–2.0)
BASOS ABS: 0.2 10*3/uL — AB (ref 0.0–0.1)
EOS%: 0.6 % (ref 0.0–7.0)
Eosinophils Absolute: 0.3 10*3/uL (ref 0.0–0.5)
HCT: 34.1 % — ABNORMAL LOW (ref 34.8–46.6)
HEMOGLOBIN: 10.5 g/dL — AB (ref 11.6–15.9)
LYMPH%: 84.7 % — ABNORMAL HIGH (ref 14.0–49.7)
MCH: 28.9 pg (ref 25.1–34.0)
MCHC: 30.6 g/dL — AB (ref 31.5–36.0)
MCV: 94.4 fL (ref 79.5–101.0)
MONO#: 0.9 10*3/uL (ref 0.1–0.9)
MONO%: 2 % (ref 0.0–14.0)
NEUT#: 5.5 10*3/uL (ref 1.5–6.5)
NEUT%: 12.3 % — ABNORMAL LOW (ref 38.4–76.8)
Platelets: 297 10*3/uL (ref 145–400)
RBC: 3.62 10*6/uL — ABNORMAL LOW (ref 3.70–5.45)
RDW: 17.5 % — AB (ref 11.2–14.5)
WBC: 44.5 10*3/uL — ABNORMAL HIGH (ref 3.9–10.3)
lymph#: 37.7 10*3/uL — ABNORMAL HIGH (ref 0.9–3.3)

## 2013-06-21 LAB — LACTATE DEHYDROGENASE (CC13): LDH: 235 U/L (ref 125–245)

## 2013-06-21 MED ORDER — SODIUM POLYSTYRENE SULFONATE 15 GM/60ML PO SUSP: 15.0000 g | Freq: Once | ORAL | Status: DC

## 2013-06-21 NOTE — Telephone Encounter (Signed)
Substitution for insurance reasons is fine with me.

## 2013-06-21 NOTE — Telephone Encounter (Signed)
gv pt appt schedule for june/july. °

## 2013-06-21 NOTE — Telephone Encounter (Signed)
Received fax from [place] regarding pt's Rx for humalog kwikpen inj.  Pt's insurance does not cover this medication.  Possible alternative humalog flexpen.  Derl Barrow, RN

## 2013-06-21 NOTE — Progress Notes (Signed)
[place], [place] [address]  DIAGNOSIS: CLL (chronic lymphocytic leukemia) - Plan: CBC with Differential, Comprehensive metabolic panel (Cmet) - [facility], Lactate dehydrogenase (LDH) - [facility]  CLL  Hyperkalemia - Plan: Basic metabolic panel (Bmet) - [facility]  Diabetic nephropathy  Chief Complaint  Patient presents with  . Follow-up    CURRENT THERAPY:  Active observation.  PAST THERAPY: Monthly chlorabucil [place] January 2011 in January 2012;   INTERVAL HISTORY: Christine Santos 68 y.o. 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 She was last seen by me on 02/22/2013. Today she is accompanied by her significant other Mr. Christine Santos and her sister Christine Santos.  She reports a normal appetite and denies weight loss. She denies vomiting, diarrhea. She denies fever, anorexia, weight loss, headache, visual changes, confusion, drenching night sweats, palpable lymph node swelling, mucositis, or odynophagia, dysphagia, nausea, jaundice, chest pain, palpitations, dyspnea, dyspnea on exertion, productive cough, bleeding, offices, abdominal pain, early satiety, melena, hematochezia, hematuria, weakness. He reports having her colonoscopy done a several months ago.   MEDICAL HISTORY: Past Medical History  Diagnosis Date  . CLL (chronic lymphoblastic leukemia)   . Hyperlipidemia   . Hypertension   . Diastolic heart failure   . CAD (coronary artery disease)     s/p CABG in 1999  . Peripheral vascular disease   . Heart murmur   . Myocardial infarction     "I had one but didn't know it" (01/22/2013)  . PAD (peripheral artery disease)   . IDDM (insulin dependent diabetes mellitus)   . Anemia 08/29/2011  . GERD (gastroesophageal reflux disease)   . H/O hiatal hernia   . Arthritis     "legs" (01/22/2013)     INTERIM HISTORY: has CLL; DIABETES MELLITUS, II, COMPLICATIONS; HYPERCHOLESTEROLEMIA; OBESITY, NOS; HYPERTENSION, BENIGN SYSTEMIC; CORONARY, ARTERIOSCLEROSIS; SINUS BRADYCARDIA; Chronic systolic heart failure; PERIPHERAL VASCULAR DISEASE; REFLUX ESOPHAGITIS; IRRITABLE BOWEL SYNDROME; Chronic kidney disease, stage III (moderate); CYSTOCELE/RECTOCELE/PROLAPSE,UNSPEC.; Osteoarthritis, multiple sites; Anxiety; Stress incontinence, female; Gout; Anemia; Foot pain; Osteopenia; Diabetic retinopathy; Macular degeneration; Diabetic neuropathy; Diabetic nephropathy; and Hyperkalemia on her problem list.    ALLERGIES:  is allergic to amoxicillin; cephalexin; and codeine phosphate.  MEDICATIONS: has a current medication list which includes the following prescription(s): accu-chek fastclix lancets, allopurinol, vitamin c, aspirin, atorvastatin, b-d uf iii mini pen needles, fenofibric acid, clonidine, clopidogrel, felodipine, ferrous sulfate, garlic, hydrochlorothiazide, insulin aspart, insulin glargine, lisinopril, melatonin, metoprolol tartrate, multivitamins ther. w/minerals, fish oil, OVER THE COUNTER MEDICATION, pantoprazole, paroxetine, tramadol, and sodium polystyrene.  SURGICAL HISTORY:  Past Surgical History  Procedure Laterality Date  . Cataract extraction w/ intraocular lens  implant, bilateral Bilateral ?2011  . Heel spur excision Bilateral 1970's  . Cholecystectomy  1993  . Tubal ligation  1980's  . Cardiac catheterization      "I've had 2-4" (01/22/2013)  . Coronary artery bypass graft  10/1994    "CABG X3"  . Shoulder open rotator cuff repair Bilateral 1990's    "2 times on 1 side; once on the other"  . Angioplasty / stenting iliac      [name] 11/25/2010 (01/22/2013)  . Angioplasty / stenting femoral Right 09/2010    SFA/notes 11/25/2010 (01/22/2013)  . Angioplasty / stenting femoral Left 11/2010    SFA/notes 11/25/2010 (01/22/2013)  . Transluminal atherectomy femoral artery Right  01/22/2013    &  balloon    REVIEW OF SYSTEMS:   Constitutional: Denies fevers, chills or abnormal weight loss Eyes: Denies blurriness of vision Ears, nose, mouth, throat, and face: Denies mucositis or sore throat Respiratory: Denies cough, dyspnea or wheezes Cardiovascular: Denies palpitation, chest discomfort or lower extremity swelling Gastrointestinal:  Denies nausea, heartburn or change in bowel habits Skin: Denies abnormal skin rashes Lymphatics: Denies new lymphadenopathy or easy bruising Neurological:Denies numbness, tingling or new weaknesses Behavioral/Psych: Mood is stable, no new changes  All other systems were reviewed with the patient and are negative.  PHYSICAL EXAMINATION: ECOG PERFORMANCE STATUS: 1 - Symptomatic but completely ambulatory  Blood pressure 152/54, pulse 61, temperature 97.8 F (36.6 C), temperature source Oral, resp. rate 18, height 5\' 5"  (1.651 m), weight 183 lb 11.2 oz (83.326 kg), SpO2 96.00%.  GENERAL:alert, no distress and comfortable anxious SKIN: skin color, texture, turgor are normal, no rashes or significant lesions EYES: normal, Conjunctiva are pink and non-injected, sclera clear OROPHARYNX:no exudate, no erythema and lips, buccal mucosa, and tongue normal  NECK: supple, thyroid normal size, non-tender, without nodularity LYMPH:  no palpable lymphadenopathy in the cervical, axillary or supraclavicular LUNGS: clear to auscultation and percussion with normal breathing effort HEART: regular rate & rhythm and no murmurs and trace lower extremity versus trace edema ABDOMEN:abdomen soft, non-tender and normal bowel sounds Musculoskeletal:no cyanosis of digits and no clubbing  NEURO: alert & oriented x 3 with fluent speech, no focal motor/sensory deficits   LABORATORY DATA: Results for orders placed in visit on 06/21/13 (from the past 48 hour(s))  CBC WITH DIFFERENTIAL     Status: Abnormal   Collection Time    06/21/13 12:49 PM      Result  Value Ref Range   WBC 44.5 (*) 3.9 - 10.3 10e3/uL   NEUT# 5.5  1.5 - 6.5 10e3/uL   HGB 10.5 (*) 11.6 - 15.9 g/dL   HCT 34.1 (*) 34.8 - 46.6 %   Platelets 297  145 - 400 10e3/uL   MCV 94.4  79.5 - 101.0 fL   MCH 28.9  25.1 - 34.0 pg   MCHC 30.6 (*) 31.5 - 36.0 g/dL   RBC 3.62 (*) 3.70 - 5.45 10e6/uL   RDW 17.5 (*) 11.2 - 14.5 %   lymph# 37.7 (*) 0.9 - 3.3 10e3/uL   MONO# 0.9  0.1 - 0.9 10e3/uL   Eosinophils Absolute 0.3  0.0 - 0.5 10e3/uL   Basophils Absolute 0.2 (*) 0.0 - 0.1 10e3/uL   NEUT% 12.3 (*) 38.4 - 76.8 %   LYMPH% 84.7 (*) 14.0 - 49.7 %   MONO% 2.0  0.0 - 14.0 %   EOS% 0.6  0.0 - 7.0 %   BASO% 0.4  0.0 - 2.0 %  COMPREHENSIVE METABOLIC PANEL (0000000)     Status: Abnormal   Collection Time    06/21/13 12:49 PM      Result Value Ref Range   Sodium 143  136 - 145 mEq/L   Potassium 5.5 (*) 3.5 - 5.1 mEq/L   Chloride 112 (*) 98 - 109 mEq/L   CO2 19 (*) 22 - 29 mEq/L   Glucose 110  70 - 140 mg/dl   BUN 51.3 (*) 7.0 - 26.0 mg/dL   Creatinine 2.2 (*) 0.6 - 1.1 mg/dL   Total Bilirubin 0.41  0.20 - 1.20 mg/dL   Alkaline Phosphatase 87  40 - 150 U/L   AST 20  5 - 34 U/L   ALT 18  0 - 55  U/L   Total Protein 6.1 (*) 6.4 - 8.3 g/dL   Albumin 3.5  3.5 - 5.0 g/dL   Calcium 9.2  8.4 - 10.4 mg/dL   Anion Gap 12 (*) 3 - 11 mEq/L  LACTATE DEHYDROGENASE (CC13)     Status: None   Collection Time    06/21/13 12:49 PM      Result Value Ref Range   LDH 235  125 - 245 U/L       Labs:  Lab Results  Component Value Date   WBC 44.5* 06/21/2013   HGB 10.5* 06/21/2013   HCT 34.1* 06/21/2013   MCV 94.4 06/21/2013   PLT 297 06/21/2013   NEUTROABS 5.5 06/21/2013      Chemistry      Component Value Date/Time   NA 143 06/21/2013 1249   NA 140 05/22/2013 1421   K 5.5* 06/21/2013 1249   K 5.4* 05/22/2013 1421   CL 106 05/22/2013 1421   CL 110* 05/28/2012 1259   CO2 19* 06/21/2013 1249   CO2 23 05/22/2013 1421   BUN 51.3* 06/21/2013 1249   BUN 52* 05/22/2013 1421   CREATININE 2.2* 06/21/2013  1249   CREATININE 2.14* 05/22/2013 1421   CREATININE 1.9* 02/08/2013 0854      Component Value Date/Time   CALCIUM 9.2 06/21/2013 1249   CALCIUM 9.3 05/22/2013 1421   ALKPHOS 87 06/21/2013 1249   ALKPHOS 79 08/29/2011 1335   AST 20 06/21/2013 1249   AST 16 08/29/2011 1335   ALT 18 06/21/2013 1249   ALT 18 03/11/2013 0844   BILITOT 0.41 06/21/2013 1249   BILITOT 0.3 08/29/2011 1335      RADIOGRAPHIC STUDIES: None  ASSESSMENT: Christine Santos 68 y.o. female with a history of CLL (chronic lymphocytic leukemia) - Plan: CBC with Differential, Comprehensive metabolic panel (Cmet) - [facility], Lactate dehydrogenase (LDH) - [facility]  CLL  Hyperkalemia - Plan: Basic metabolic panel (Bmet) - [facility]  Diabetic nephropathy   PLAN:  #1 CLL. Her B. symptoms and lymphocytosis  appear to be stable. Her creatinine is stable since her last visit. On the prior visits, Dr. Lamonte Sakai discussed with the patient the option of proceeding with salvage chemotherapy with ibrutinib then or waiting to see if she became more symptomatic and/or kidney function decreased. That time she preferred to wait. As there is no absolute indication to resume chemotherapy, we continued observation. Moreover,  given the results of today's lab that shows all improvement in her creatinine and a stable lymphocytosis, the patient agrees to continue active observation.   #2 hypertension.  Today her blood pressure was mildly elevated.  She is on hydrochlorothiazide, lisinopril, metoprolol, clonidine patch.  We we advised the patient to continue to check her blood pressure in the setting of her home and to also contact her primary care physician should her blood pressure consistently over140/90. She will continue her low-salt diet.   #3 diabetes mellitus. She is on insulin per her primary care physician.  #4 chronic renal insufficiency likely secondary to #2, #3.  Her creatinine today is 2.2.  In the past, she had a renal biopsy which showed CLL. In the future, if  there is concern for how to attribute worsening renal function either 2 hypertension, diabetes or progression of CLL, a renal biopsy may be considered.  #5 hyperlipidemia. She is on fish oil and atorvastatin that her primary care physician is managing.  #6 peripheral vascular disease. She is on aspirin and Plavix per cardiologist she  had a stent placed in 2012 and recently re-done this year.   #7 Hyperkalemia.  Likely secondary to medications.  We will give her one dose of kayexalate.  She was provided hyperkalemia handout.   #8 followup. Patient instructed to followup in 2 months at that time we will check a CBC, CMP. We will also check a CBC in two months.  All questions were answered. The patient knows to call the clinic with any problems, questions or concerns. We can certainly see the patient much sooner if necessary.  Patient was provided and after visit summary.  I spent 15 minutes counseling the patient face to face. The total time spent in the appointment was 25 minutes.    Concha [place], MD 06/22/2013 4:51 PM

## 2013-06-21 NOTE — Patient Instructions (Signed)
Sodium Polystyrene Sulfonate powder for reconstitution to suspension What is this medicine? SODIUM POLYSTYRENE SULFONATE (SOE dee um pol ee STYE reen SUHL fuh neyt) takes potassium out of the body by binding to it in the intestines. It is used to treat too much potassium in the body. This medicine may be used for other purposes; ask your health care provider or pharmacist if you have questions. COMMON BRAND NAME(S): Kalexate, Kayexalate, Kionex , Marlexate What should I tell my health care provider before I take this medicine? They need to know if you have any of these conditions: -constipation or gut obstruction -edema or water retention -heart failure -high blood pressure -low levels of calcium or potassium in the blood -low sodium diet -an unusual or allergic reaction to sodium polystyrene, other medicines, foods, dyes, or preservatives -pregnant or trying to get pregnant -breast-feeding How should I use this medicine? This medicine is usually given in a hospital or clinic setting. It may be given by mouth or rectally. Follow the directions on the prescription label. The powder must be mixed with water, syrup, or other liquid before using. Use mixture within 24 hours. Take your medicine at regular intervals. Do not take your medicine more often than directed. Talk to your pediatrician regarding the use of this medicine in children. Special care may be needed. Overdosage: If you think you have taken too much of this medicine contact a poison control center or emergency room at once. NOTE: This medicine is only for you. Do not share this medicine with others. What if I miss a dose? If you miss a dose, take it as soon as you can. If it is almost time for your next dose, take only that dose. Do not take double or extra doses. What may interact with this medicine? Do not take this medicine with any of the following medications: -sorbitol, unless your doctor tells you to This medicine may also  interact with the following medications: -antacids with aluminum or magnesium -digoxin -laxatives with aluminum or magnesium -lithium -thyroxine This list may not describe all possible interactions. Give your health care provider a list of all the medicines, herbs, non-prescription drugs, or dietary supplements you use. Also tell them if you smoke, drink alcohol, or use illegal drugs. Some items may interact with your medicine. What should I watch for while using this medicine? See your doctor for regular check ups. You will need important blood work and other testing done while you are taking this medicine. You may need to be on a special diet while you are taking this medicine. Discuss the foods you eat and the vitamins you take with your health care professional. Talk to your doctor about what to do if you are constipated. What side effects may I notice from receiving this medicine? Side effects that you should report to your doctor or health care professional as soon as possible: -allergic reactions like skin rash, itching or hives, swelling of the face, lips, or tongue -black or tar-like stool -breathing problems -confusion -fast, irregular heartbeat -feeling faint or lightheaded, falls -increased thirst -muscle spasms or cramps -passing large amounts of urine -rectal or lower stomach pain -slowed or trouble thinking -unusual swelling or water retention -unusual muscle weakness Side effects that usually do not require medical attention (report to your doctor or health care professional if they continue or are bothersome): -constipation or diarrhea -loss of appetite -nausea, vomiting This list may not describe all possible side effects. Call your doctor for medical advice  about side effects. You may report side effects to FDA at 1-800-FDA-1088. Where should I keep my medicine? Keep out of the reach of children. Store at room temperature between 15 and 30 degrees C (59 and 86  degrees F). Use any mixture in 24 hours or throw it away. Throw away any unused medicine after the expiration date. NOTE: This sheet is a summary. It may not cover all possible information. If you have questions about this medicine, talk to your doctor, pharmacist, or health care provider.  2014, Elsevier/Gold Standard. (2009-11-11 15:26:07) Chronic Lymphocytic Leukemia Chronic lymphocytic leukemia (CLL) is a type of cancer of the bone marrow and blood cells. Bone marrow is the soft, spongy tissue inside your bone. In CLL, the bone marrow makes too many white blood cells that usually fight infection in the body (lymphocytes). CLL usually gets worse slowly and is the most common type of adult leukemia.  RISK FACTORS No one knows the exact cause of CLL. There is a higher risk of CLL in people who:   Are older than 50 years.  Are white.  Are female.  Have a family history of CLL or other cancers of the lymph system.  Are of [place] or [place] descent.  Have been exposed to certain chemicals, such as Agent Orange (used in the [place] War) or other herbicides or insecticides. SYMPTOMS  At first, there may be no symptoms of chronic lymphocytic leukemia. After a while, some symptoms may occur, such as:   Feeling more tired than usual, even after rest.  Unplanned weight loss.  Heavy sweating at night.  Fevers.  Shortness of breath.  Decreased energy.  Paleness.  Painless, swollen lymph nodes.  A feeling of fullness in the upper left part of the abdomen.  Easy bruising or bleeding.  More frequent infections. DIAGNOSIS  Your health care provider may perform the following exams and tests to diagnose CLL:  Physical exam to check for an enlarged spleen, liver, or lymph nodes.  Blood and bone marrow tests to identify the presence of cancer cells. These may include tests such as complete blood count, flow cytometry, immunophenotyping, and fluorescence in situ  hybridization (FISH).  CT scan to look for swelling or abnormalities in your spleen, liver, and lymph nodes. TREATMENT  Treatment options for CLL depend on the stage and the presence of symptoms. There are a number of types of treatment used for this condition, including:  Observation.  Targeted drugs. These are drugs that interfere with chemicals that leukemia cells need in order to grow and multiply. They identify and attack specific cancer cells without harming normal cells.  Chemotherapy drugs. These medicines kill cells that are multiplying quickly, such as leukemia cells.  Radiation.  Surgery to remove the spleen.  Biological therapy. This treatment boosts the ability of your own immune system to fight the leukemia cells.  Bone marrow or peripheral blood stem cell transplant. This treatment allows the patient to receive very high doses of chemotherapy and/or radiation. These high doses kill the cancer cells but also destroy the bone marrow. After treatment is complete, you are given donor bone marrow or stem cells, which will replace the bone marrow. HOME CARE INSTRUCTIONS   Because you have an increased risk of infection, practice good hand washing and avoid being around people who are ill or being in crowded places.  Because you have an increased risk of bleeding and bruising, avoid contact sports or other rough activities.  Only take  over-the-counter or prescription medicines for pain, discomfort, or fever as directed by your health care provider.  Although some of your treatments might affect your appetite, try to eat regular, healthy meals.  If you develop any side effects, such as nausea, diarrhea, rash, white patches in your mouth, a sore throat, difficulty swallowing, or severe fatigue, tell your health care provider. He or she may have recommendations of things you can do to improve symptoms.  Consider learning some ways to cope with the stress of having a chronic  illness, such as yoga, meditation, or participating in a support group. SEEK MEDICAL CARE IF:  You develop chest pains.  You notice pain, swelling or redness anywhere in your legs.  You have pain in your belly (abdomen).  You develop new bruises that are getting bigger.  You have painful or more swollen lymph nodes.  You develop bleeding from your gums, nose, or in your urine or stools.  You are unable to stop throwing up (vomiting).  You cannot keep liquids down.  You feel lightheaded.  You have a fever or persistent symptoms for more than 2 3 days.  You develop a severe stiff neck or headache. SEEK IMMEDIATE MEDICAL CARE IF:  You have trouble breathing or feel short of breath.  You faint. Document Released: 05/29/2008 Document Revised: 09/12/2012 Document Reviewed: 07/05/2012 [facility] Patient Information 2014 [place]. Hyperkalemia Hyperkalemia is when you have too much potassium in your blood. This can be a life-threatening condition. Potassium is normally removed (excreted) from the body by the kidneys. CAUSES  The potassium level in your body can become too high for the following reasons:  You take in too much potassium. You can do this by:  Using salt substitutes. They contain large amounts of potassium.  Taking potassium supplements from your caregiver. The dose may be too high for you.  Eating foods or taking nutritional products with potassium.  You excrete too little potassium. This can happen if:  Your kidneys are not functioning properly. Kidney (renal) disease is a very common cause of hyperkalemia.  You are taking medicines that lower your excretion of potassium, such as certain diuretic medicines.  You have an adrenal gland disease called Addison's disease.  You have a urinary tract obstruction, such as kidney stones.  You are on treatment to mechanically clean your blood (dialysis) and you skip a treatment.  You release a high amount of  potassium from your cells into your blood. You may have a condition that causes potassium to move from your cells to your bloodstream. This can happen with:  Injury to muscles or other tissues. Most potassium is stored in the muscles.  Severe burns or infections.  Acidic blood plasma (acidosis). Acidosis can result from many diseases, such as uncontrolled diabetes. SYMPTOMS  Usually, there are no symptoms unless the potassium is dangerously high or has risen very quickly. Symptoms may include:  Irregular or very slow heartbeat.  Feeling sick to your stomach (nauseous).  Tiredness (fatigue).  Nerve problems such as tingling of the skin, numbness of the hands or feet, weakness, or paralysis. DIAGNOSIS  A simple blood test can measure the amount of potassium in your body. An electrocardiogram test of the heart can also help make the diagnosis. The heart may beat dangerously fast or slow down and stop beating with severe hyperkalemia.  TREATMENT  Treatment depends on how bad the condition is and on the underlying cause.  If the hyperkalemia is an emergency (causing heart problems  or paralysis), many different medicines can be used alone or together to lower the potassium level briefly. This may include an insulin injection even if you are not diabetic. Emergency dialysis may be needed to remove potassium from the body.  If the hyperkalemia is less severe or dangerous, the underlying cause is treated. This can include taking medicines if needed. Your prescription medicines may be changed. You may also need to take a medicine to help your body get rid of potassium. You may need to eat a diet low in potassium. HOME CARE INSTRUCTIONS   Take medicines and supplements as directed by your caregiver.  Do not take any over-the-counter medicines, supplements, natural products, herbs, or vitamins without reviewing them with your caregiver. Certain supplements and natural food products can have high  amounts of potassium. Other products (such as ibuprofen) can damage weak kidneys and raise your potassium.  You may be asked to do repeat lab tests. Be sure to follow these directions.  If you have kidney disease, you may need to follow a low potassium diet. SEEK MEDICAL CARE IF:   You notice an irregular or very slow heartbeat.  You feel lightheaded.  You develop weakness that is unusual for you. SEEK IMMEDIATE MEDICAL CARE IF:   You have shortness of breath.  You have chest discomfort.  You pass out (faint). MAKE SURE YOU:   Understand these instructions.  Will watch your condition.  Will get help right away if you are not doing well or get worse. Document Released: 12/31/2001 Document Revised: 04/04/2011 Document Reviewed: 06/17/2010 [facility] Patient Information 2014 [place], [place].

## 2013-06-21 NOTE — Telephone Encounter (Signed)
Ok for substitution of humolog flexpen per verbal order by Dr. Andria Frames.  Order given to Deidre Ala, Software engineer at [employer].  Derl Barrow, RN

## 2013-06-24 ENCOUNTER — Telehealth: Payer: Self-pay

## 2013-06-24 ENCOUNTER — Other Ambulatory Visit: Payer: Self-pay | Admitting: Internal Medicine

## 2013-06-24 DIAGNOSIS — K5900 Constipation, unspecified: Secondary | ICD-10-CM

## 2013-06-24 MED ORDER — LACTULOSE 10 GM/15ML PO SOLN: 20.0000 g | Freq: Every day | ORAL | Status: DC | PRN

## 2013-06-24 NOTE — Telephone Encounter (Signed)
Returning call from earlier. Pt asked about a different laxative b/c the kayexalate did not work after one dose. S/w Dr Juliann Mule and lactulose was ordered. S/w pt that he changed the medication. Explained that potassium is released in the stool and will help lower the K+. She can use this at other times if she gets constipated as well.

## 2013-06-26 ENCOUNTER — Other Ambulatory Visit: Payer: Self-pay | Admitting: Interventional Cardiology

## 2013-07-05 ENCOUNTER — Other Ambulatory Visit (HOSPITAL_BASED_OUTPATIENT_CLINIC_OR_DEPARTMENT_OTHER): Payer: Medicare Other

## 2013-07-05 DIAGNOSIS — C9110 Chronic lymphocytic leukemia of B-cell type not having achieved remission: Secondary | ICD-10-CM

## 2013-07-05 DIAGNOSIS — E875 Hyperkalemia: Secondary | ICD-10-CM

## 2013-07-05 LAB — BASIC METABOLIC PANEL (CC13)
Anion Gap: 9 mEq/L (ref 3–11)
BUN: 43 mg/dL — AB (ref 7.0–26.0)
CHLORIDE: 109 meq/L (ref 98–109)
CO2: 24 meq/L (ref 22–29)
Calcium: 9.5 mg/dL (ref 8.4–10.4)
Creatinine: 2.3 mg/dL — ABNORMAL HIGH (ref 0.6–1.1)
GLUCOSE: 139 mg/dL (ref 70–140)
POTASSIUM: 5.4 meq/L — AB (ref 3.5–5.1)
SODIUM: 141 meq/L (ref 136–145)

## 2013-07-09 ENCOUNTER — Other Ambulatory Visit (INDEPENDENT_AMBULATORY_CARE_PROVIDER_SITE_OTHER): Payer: Medicare Other

## 2013-07-09 ENCOUNTER — Telehealth: Payer: Self-pay | Admitting: Medical Oncology

## 2013-07-09 DIAGNOSIS — E7800 Pure hypercholesterolemia, unspecified: Secondary | ICD-10-CM

## 2013-07-09 LAB — LIPID PANEL
CHOL/HDL RATIO: 3
Cholesterol: 157 mg/dL (ref 0–200)
HDL: 48.6 mg/dL (ref >39.00)
LDL CALC: 86 mg/dL (ref 0–99)
NonHDL: 108.4
Triglycerides: 110 mg/dL (ref 0.0–149.0)
VLDL: 22 mg/dL (ref 0.0–40.0)

## 2013-07-09 LAB — HEPATIC FUNCTION PANEL
ALBUMIN: 3.7 g/dL (ref 3.5–5.2)
ALT: 21 U/L (ref 0–35)
AST: 24 U/L (ref 0–37)
Alkaline Phosphatase: 71 U/L (ref 39–117)
BILIRUBIN TOTAL: 0.4 mg/dL (ref 0.2–1.2)
Bilirubin, Direct: 0 mg/dL (ref 0.0–0.3)
Total Protein: 6.2 g/dL (ref 6.0–8.3)

## 2013-07-09 NOTE — Telephone Encounter (Signed)
Pt called asking about her labs she had drawn on Friday. I gave her results and we discussed her potassium. She states that she does not use any type of salt substitutes. She only took one dose of the lactulose last week and states she will take another one today. She sees her cardiologist next week and will have her potassium rechecked.

## 2013-07-10 ENCOUNTER — Other Ambulatory Visit: Payer: Self-pay | Admitting: Cardiology

## 2013-07-10 ENCOUNTER — Telehealth: Payer: Self-pay | Admitting: Interventional Cardiology

## 2013-07-10 DIAGNOSIS — E7800 Pure hypercholesterolemia, unspecified: Secondary | ICD-10-CM

## 2013-07-10 NOTE — Telephone Encounter (Signed)
Follow Up    Pt calling following up on lab work results.

## 2013-07-10 NOTE — Telephone Encounter (Signed)
Returned pt's call.

## 2013-07-18 ENCOUNTER — Ambulatory Visit (INDEPENDENT_AMBULATORY_CARE_PROVIDER_SITE_OTHER): Payer: Medicare Other | Admitting: Interventional Cardiology

## 2013-07-18 ENCOUNTER — Encounter: Payer: Self-pay | Admitting: Interventional Cardiology

## 2013-07-18 VITALS — BP 137/74 | HR 55 | Ht 66.0 in | Wt 183.0 lb

## 2013-07-18 DIAGNOSIS — I2510 Atherosclerotic heart disease of native coronary artery without angina pectoris: Secondary | ICD-10-CM

## 2013-07-18 DIAGNOSIS — I10 Essential (primary) hypertension: Secondary | ICD-10-CM

## 2013-07-18 DIAGNOSIS — I739 Peripheral vascular disease, unspecified: Secondary | ICD-10-CM

## 2013-07-18 DIAGNOSIS — E875 Hyperkalemia: Secondary | ICD-10-CM

## 2013-07-18 DIAGNOSIS — E7800 Pure hypercholesterolemia, unspecified: Secondary | ICD-10-CM

## 2013-07-18 NOTE — Progress Notes (Signed)
Patient ID: Christine Santos, female   DOB: 23-Apr-1945, 68 y.o.   MRN: [id number] Patient ID: Christine Santos, female   DOB: 27-Jul-1945, 68 y.o.   MRN: [id number] . Patient ID: Christine Santos, female   DOB: 1945-05-26, 68 y.o.   MRN: [id number]    [facility] [place], [address], [place]  [address] Phone: [phone] Fax:  [phone]  Date:  07/18/2013   ID:  Calvin, Donica [birth year], MRN [id number]  PCP:  Zigmund Gottron, MD      History of Present Illness: Christine Santos is a 68 y.o. female who had bilateral LE revascularization. The right leg was treated with PTA and the left SFA was treated with rotational atherectomy a few years ago. She  had some right leg claudication. She has some cramps in her legs at night. She is most limited by foot pain. Knee pain is better.  she underwent angiography of her lower extremities. The right leg was treated with PTA and the left SFA was treated with rotational atherectomy in December 2014. She has no claudication since the procedure. She has some cramps in her legs at night. She is most limited by foot pain. No bleeding problems at her left groin where the entry site was. She is not exercising very regularly.     Wt Readings from Last 3 Encounters:  07/18/13 183 lb (83.008 kg)  06/21/13 183 lb 11.2 oz (83.326 kg)  05/22/13 181 lb (82.101 kg)     Past Medical History  Diagnosis Date  . CLL (chronic lymphoblastic leukemia)   . Hyperlipidemia   . Hypertension   . Diastolic heart failure   . CAD (coronary artery disease)     s/p CABG in 1999  . Peripheral vascular disease   . Heart murmur   . Myocardial infarction     "I had one but didn't know it" (01/22/2013)  . PAD (peripheral artery disease)   . IDDM (insulin dependent diabetes mellitus)   . Anemia 08/29/2011  . GERD (gastroesophageal reflux disease)   . H/O hiatal hernia   . Arthritis     "legs" (01/22/2013)    Current Outpatient Prescriptions  Medication Sig Dispense  Refill  . ACCU-CHEK FASTCLIX LANCETS MISC USE AS DIRECTED 3 TIMES DAILY  100 each  PRN  . allopurinol (ZYLOPRIM) 300 MG tablet TAKE 1/2 TABLET BY MOUTH ONCE DAILY  45 tablet  3  . Ascorbic Acid (VITAMIN C) 100 MG tablet Take 100 mg by mouth daily.      Marland Kitchen aspirin 81 MG tablet Take 81 mg by mouth daily.      Marland Kitchen atorvastatin (LIPITOR) 80 MG tablet TAKE 1 TABLET BY MOUTH ONCE DAILY  90 tablet  1  . B-D UF III MINI PEN NEEDLES 31G X 5 MM MISC as directed once daily  100 each  PRN  . Choline Fenofibrate (FENOFIBRIC ACID) 45 MG CPDR TAKE ONE CAPSULE BY MOUTH ONCE DAILY  90 capsule  1  . cloNIDine (CATAPRES - DOSED IN MG/24 HR) 0.2 mg/24hr patch APPLY 1 PATCH ON THE SKIN AND CHANGE WEEKLY  12 patch  3  . clopidogrel (PLAVIX) 75 MG tablet TAKE 1 TABLET BY MOUTH DAILY  90 tablet  3  . felodipine (PLENDIL) 10 MG 24 hr tablet TAKE 2 TABLETS BY MOUTH DAILY  180 tablet  0  . ferrous sulfate 325 (65 FE) MG tablet Take 1 tablet (325 mg total) by mouth daily with breakfast.  3  . GNP GARLIC EXTRACT PO Take 1 tablet by mouth daily.        . hydrochlorothiazide (HYDRODIURIL) 25 MG tablet TAKE 1 TABLET BY MOUTH EVERY DAY  90 tablet  3  . insulin aspart (NOVOLOG) cartridge Inject 5 Units into the skin 3 (three) times daily with meals.  15 mL  3  . Insulin Glargine (LANTUS SOLOSTAR) 100 UNIT/ML SOPN Inject 35 Units into the skin daily.  5 pen  12  . lactulose (CHRONULAC) 10 GM/15ML solution Take 30 mLs (20 g total) by mouth daily as needed for moderate constipation.  120 mL  0  . lisinopril (PRINIVIL,ZESTRIL) 40 MG tablet TAKE 1 TABLET BY MOUTH EVERY DAY  90 tablet  3  . Melatonin 5 MG CAPS Take by mouth at bedtime.      . metoprolol tartrate (LOPRESSOR) 25 MG tablet TAKE 1/2 TABLET BY MOUTH TWICE DAILY  90 tablet  3  . Multiple Vitamins-Minerals (MULTIVITAMINS THER. W/MINERALS) TABS Take 1 tablet by mouth daily.        . Omega-3 Fatty Acids (FISH OIL) 1000 MG CAPS Take 2,000 mg by mouth 2 (two) times daily.        Marland Kitchen OVER THE COUNTER MEDICATION Take 1 tablet by mouth daily. Macular Degenerative Eye Supplement       . pantoprazole (PROTONIX) 40 MG tablet TAKE 1 TABLET BY MOUTH EVERY DAY  90 tablet  3  . PARoxetine (PAXIL) 20 MG tablet Take 20 mg by mouth daily.      . sodium polystyrene (KAYEXALATE) 15 GM/60ML suspension Take 60 mLs (15 g total) by mouth once.  120 mL  0  . traMADol (ULTRAM) 50 MG tablet TAKE 1 TABLET BY MOUTH FOUR TIMES DAILY  360 tablet  1   No current facility-administered medications for this visit.    Allergies:    Allergies  Allergen Reactions  . Amoxicillin Rash    REACTION: rash  . Cephalexin Rash  . Codeine Phosphate Rash    REACTION: rash    Social History:  The patient  reports that she has never smoked. She has never used smokeless tobacco. She reports that she does not drink alcohol or use illicit drugs.   Family History:  The patient's family history includes Heart disease in her mother; Pneumonia in her father.   ROS:  Please see the history of present illness.  No nausea, vomiting.  No fevers, chills.  No focal weakness.  No dysuria. Leg pain as noted.  All other systems reviewed and negative.   PHYSICAL EXAM: VS:  BP 137/74  Pulse 55  Ht 5\' 6"  (1.676 m)  Wt 183 lb (83.008 kg)  BMI 29.55 kg/m2 Well nourished, well developed, in no acute distress HEENT: normal Neck: no JVD, no carotid bruits Cardiac:  normal S1, S2; RRR;  Lungs:  clear to auscultation bilaterally, no wheezing, rhonchi or rales Abd: soft, nontender, no hepatomegaly Ext: no edema; firm nodule on the right calf scar; 2cm x 1 cm Skin: warm and dry Neuro:   no focal abnormalities noted  EKG:  Sinus bradycardia, T wave inversion laterally.  No change.     ASSESSMENT AND PLAN:  Coronary atherosclerosis of native coronary artery  Continue Aspirin 325 mg tablet, 325 mg, 1/2 tablet, orally, daily Continue Plavix Tablet, 75 MG, 1 tablet, Once a day Continue Metoprolol Tartrate Tablet, 25 MG,  1/2 tab, Orally, Twice a day Notes: No angina. Stress test negative in 2012 for ischemia.  Anterior scar. EF 58%. s/p CABG in 1996.  2. Hypertension, essential  Continue Lisinopril Tablet, 40 mg, 1 tablet, Orally, Once a day Continue HCTZ 25 MG Tablet, 25 MG, 1 tablet, Orally, q AM Continue Clonidine HCl Patch Weekly, 0.2 MG/24HR, 1 patch, Transdermal Continue Felodipine Miscellaneous Unspecified, 10 Milligram, TAKE 2 TABLETS BY MOUTH DAILY Notes: Controlled at other offices. Controlled reading today. 3. Mixed hyperlipidemia  Continue Atorvastatin Calcium Tablet, 80.0 Milligram, TAKE 1 TABLET BY MOUTH ONCE A DAY Notes: LDL 84. TG controlled in 4/14. In 8/14, LDL 76.  LDL 86 in 07/09/13 on atorva 80 mg daily.. 4. Atherosclerosis of native arteries of the extremities with intermittent claudication  Notes: Claudication resolved. right SFA disease treated in 12/14 with PTA/rotational atherectomy. Watch for claudication.Will check LE arterial Doppler.  Plan for routine u/s.  F/u with Dr. Fletcher Anon.    I encouraged her to try to exercise on a regular basis. At least, 5 days a week 30 minutes a day.  5. Hyperkalemia: stable.  May be in part due to lisinopril.  HCTZ should help K+ decrease.    Signed, Mina Marble, MD, [facility] 07/18/2013 2:17 PM

## 2013-07-18 NOTE — Patient Instructions (Signed)
Your physician recommends that you continue on your current medications as directed. Please refer to the Current Medication list given to you today.  Your physician wants you to follow-up in: 1 year with Dr. Varanasi. You will receive a reminder letter in the mail two months in advance. If you don't receive a letter, please call our office to schedule the follow-up appointment.  

## 2013-07-31 ENCOUNTER — Other Ambulatory Visit: Payer: Self-pay | Admitting: Interventional Cardiology

## 2013-08-16 ENCOUNTER — Other Ambulatory Visit (HOSPITAL_BASED_OUTPATIENT_CLINIC_OR_DEPARTMENT_OTHER): Payer: Medicare Other

## 2013-08-16 ENCOUNTER — Ambulatory Visit (HOSPITAL_BASED_OUTPATIENT_CLINIC_OR_DEPARTMENT_OTHER): Payer: Medicare Other | Admitting: Internal Medicine

## 2013-08-16 ENCOUNTER — Telehealth: Payer: Self-pay | Admitting: Internal Medicine

## 2013-08-16 VITALS — BP 177/66 | HR 65 | Temp 98.1°F | Resp 19 | Ht 66.0 in | Wt 184.4 lb

## 2013-08-16 DIAGNOSIS — C9110 Chronic lymphocytic leukemia of B-cell type not having achieved remission: Secondary | ICD-10-CM

## 2013-08-16 DIAGNOSIS — I739 Peripheral vascular disease, unspecified: Secondary | ICD-10-CM

## 2013-08-16 DIAGNOSIS — N189 Chronic kidney disease, unspecified: Secondary | ICD-10-CM

## 2013-08-16 DIAGNOSIS — I129 Hypertensive chronic kidney disease with stage 1 through stage 4 chronic kidney disease, or unspecified chronic kidney disease: Secondary | ICD-10-CM

## 2013-08-16 DIAGNOSIS — E875 Hyperkalemia: Secondary | ICD-10-CM

## 2013-08-16 DIAGNOSIS — E785 Hyperlipidemia, unspecified: Secondary | ICD-10-CM

## 2013-08-16 DIAGNOSIS — E119 Type 2 diabetes mellitus without complications: Secondary | ICD-10-CM

## 2013-08-16 DIAGNOSIS — D72820 Lymphocytosis (symptomatic): Secondary | ICD-10-CM

## 2013-08-16 LAB — CBC WITH DIFFERENTIAL/PLATELET
BASO%: 0.3 % (ref 0.0–2.0)
Basophils Absolute: 0.1 10*3/uL (ref 0.0–0.1)
EOS%: 0.9 % (ref 0.0–7.0)
Eosinophils Absolute: 0.4 10*3/uL (ref 0.0–0.5)
HEMATOCRIT: 36.1 % (ref 34.8–46.6)
HGB: 11 g/dL — ABNORMAL LOW (ref 11.6–15.9)
LYMPH%: 85.5 % — AB (ref 14.0–49.7)
MCH: 29.6 pg (ref 25.1–34.0)
MCHC: 30.5 g/dL — ABNORMAL LOW (ref 31.5–36.0)
MCV: 97 fL (ref 79.5–101.0)
MONO#: 0.6 10*3/uL (ref 0.1–0.9)
MONO%: 1.3 % (ref 0.0–14.0)
NEUT#: 5.2 10*3/uL (ref 1.5–6.5)
NEUT%: 12 % — AB (ref 38.4–76.8)
PLATELETS: 284 10*3/uL (ref 145–400)
RBC: 3.72 10*6/uL (ref 3.70–5.45)
RDW: 16.7 % — ABNORMAL HIGH (ref 11.2–14.5)
WBC: 43 10*3/uL — AB (ref 3.9–10.3)
lymph#: 36.8 10*3/uL — ABNORMAL HIGH (ref 0.9–3.3)

## 2013-08-16 LAB — COMPREHENSIVE METABOLIC PANEL (CC13)
ALT: 24 U/L (ref 0–55)
AST: 26 U/L (ref 5–34)
Albumin: 3.5 g/dL (ref 3.5–5.0)
Alkaline Phosphatase: 95 U/L (ref 40–150)
Anion Gap: 10 mEq/L (ref 3–11)
BILIRUBIN TOTAL: 0.29 mg/dL (ref 0.20–1.20)
BUN: 38.9 mg/dL — ABNORMAL HIGH (ref 7.0–26.0)
CO2: 19 mEq/L — ABNORMAL LOW (ref 22–29)
Calcium: 9.1 mg/dL (ref 8.4–10.4)
Chloride: 111 mEq/L — ABNORMAL HIGH (ref 98–109)
Creatinine: 2 mg/dL — ABNORMAL HIGH (ref 0.6–1.1)
Glucose: 124 mg/dl (ref 70–140)
Potassium: 5.3 mEq/L — ABNORMAL HIGH (ref 3.5–5.1)
SODIUM: 140 meq/L (ref 136–145)
Total Protein: 6.1 g/dL — ABNORMAL LOW (ref 6.4–8.3)

## 2013-08-16 LAB — LACTATE DEHYDROGENASE (CC13): LDH: 240 U/L (ref 125–245)

## 2013-08-16 LAB — TECHNOLOGIST REVIEW

## 2013-08-16 NOTE — Progress Notes (Signed)
[place], MD [address] [place] [address]  DIAGNOSIS: CLL - Plan: CBC with Differential, Basic metabolic panel (Bmet) - [facility], Magnesium - [facility], Lactate dehydrogenase (LDH) - [facility]  Chief Complaint  Patient presents with  . Follow-up    CURRENT THERAPY:  Active observation.  PAST THERAPY: Monthly chlorabucil [facility] January 2011 in January 2012.  INTERVAL HISTORY: Christine Santos WAS 68 y.o. 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 She was last seen by me on 06/21/2013. Today she is accompanied by her significant other Mr. Christine Santos and her sister Christine Santos.  She reports a normal appetite and denies weight loss. She denies vomiting, diarrhea. She denies fever, anorexia, weight loss, headache, visual changes, confusion, drenching night sweats, palpable lymph node swelling, mucositis, or odynophagia, dysphagia, nausea, jaundice, chest pain, palpitations, dyspnea, dyspnea on exertion, productive cough, bleeding, offices, abdominal pain, early satiety, melena, hematochezia, hematuria, weakness. She reports having her colonoscopy done several months ago.   MEDICAL HISTORY: Past Medical History  Diagnosis Date  . CLL (chronic lymphoblastic leukemia)   . Hyperlipidemia   . Hypertension   . Diastolic heart failure   . CAD (coronary artery disease)     s/p CABG in 1999  . Peripheral vascular disease   . Heart murmur   . Myocardial infarction     "I had one but didn't know it" (01/22/2013)  . PAD (peripheral artery disease)   . IDDM (insulin dependent diabetes mellitus)   . Anemia 08/29/2011  . GERD (gastroesophageal reflux disease)   . H/O hiatal hernia   . Arthritis     "legs" (01/22/2013)    INTERIM HISTORY: has CLL; DIABETES MELLITUS, II, COMPLICATIONS; HYPERCHOLESTEROLEMIA; OBESITY, NOS;  HYPERTENSION, BENIGN SYSTEMIC; CORONARY, ARTERIOSCLEROSIS; SINUS BRADYCARDIA; Chronic systolic heart failure; PERIPHERAL VASCULAR DISEASE; REFLUX ESOPHAGITIS; IRRITABLE BOWEL SYNDROME; Chronic kidney disease, stage III (moderate); CYSTOCELE/RECTOCELE/PROLAPSE,UNSPEC.; Osteoarthritis, multiple sites; Anxiety; Stress incontinence, female; Gout; Anemia; Foot pain; Osteopenia; Diabetic retinopathy; Macular degeneration; Diabetic neuropathy; Diabetic nephropathy; and Hyperkalemia on her problem list.    ALLERGIES:  is allergic to amoxicillin; cephalexin; and codeine phosphate.  MEDICATIONS: has a current medication list which includes the following prescription(s): accu-chek fastclix lancets, allopurinol, vitamin c, aspirin, atorvastatin, b-d uf iii mini pen needles, fenofibric acid, clonidine, clopidogrel, felodipine, ferrous sulfate, garlic, hydrochlorothiazide, insulin glargine, insulin lispro, lactulose, lisinopril, melatonin, metoprolol tartrate, multivitamins ther. w/minerals, fish oil, OVER THE COUNTER MEDICATION, pantoprazole, paroxetine, sodium polystyrene, and tramadol.  SURGICAL HISTORY:  Past Surgical History  Procedure Laterality Date  . Cataract extraction w/ intraocular lens  implant, bilateral Bilateral ?2011  . Heel spur excision Bilateral 1970's  . Cholecystectomy  1993  . Tubal ligation  1980's  . Cardiac catheterization      "I've had 2-4" (01/22/2013)  . Coronary artery bypass graft  10/1994    "CABG X3"  . Shoulder open rotator cuff repair Bilateral 1990's    "2 times on 1 side; once on the other"  . Angioplasty / stenting iliac      [name] 11/25/2010 (01/22/2013)  . Angioplasty / stenting femoral Right 09/2010    SFA/notes 11/25/2010 (01/22/2013)  . Angioplasty / stenting femoral Left 11/2010    SFA/notes 11/25/2010 (01/22/2013)  . Transluminal atherectomy femoral artery Right 01/22/2013    & balloon    REVIEW OF SYSTEMS:   Constitutional: Denies fevers, chills or abnormal  weight loss Eyes: Denies blurriness of vision Ears, nose, mouth, throat, and face: Denies mucositis or sore throat Respiratory: Denies cough, dyspnea or wheezes Cardiovascular: Denies palpitation, chest discomfort or lower extremity swelling Gastrointestinal:  Denies nausea, heartburn or change in bowel habits Skin: Denies abnormal skin rashes Lymphatics: Denies new lymphadenopathy or easy bruising Neurological:Denies numbness, tingling or new weaknesses Behavioral/Psych: Mood is stable, no new changes  All other systems were reviewed with the patient and are negative.  PHYSICAL EXAMINATION: ECOG PERFORMANCE STATUS: 1 - Symptomatic but completely ambulatory  Blood pressure 177/66, pulse 65, temperature 98.1 F (36.7 C), temperature source Oral, resp. rate 19, height 5\' 6"  (1.676 m), weight 184 lb 6.4 oz (83.643 kg), SpO2 93.00%.  GENERAL:alert, no distress and comfortable anxious SKIN: skin color, texture, turgor are normal, no rashes or significant lesions EYES: normal, Conjunctiva are pink and non-injected, sclera clear OROPHARYNX:no exudate, no erythema and lips, buccal mucosa, and tongue normal  NECK: supple, thyroid normal size, non-tender, without nodularity LYMPH:  no palpable lymphadenopathy in the cervical, axillary or supraclavicular LUNGS: clear to auscultation and percussion with normal breathing effort HEART: regular rate & rhythm and no murmurs and trace lower extremity versus trace edema ABDOMEN:abdomen soft, non-tender and normal bowel sounds Musculoskeletal:no cyanosis of digits and no clubbing  NEURO: alert & oriented x 3 with fluent speech, no focal motor/sensory deficits   LABORATORY DATA: Results for orders placed in visit on 08/16/13 (from the past 48 hour(s))  CBC WITH DIFFERENTIAL     Status: Abnormal   Collection Time    08/16/13 12:49 PM      Result Value Ref Range   WBC 43.0 (*) 3.9 - 10.3 10e3/uL   NEUT# 5.2  1.5 - 6.5 10e3/uL   HGB 11.0 (*) 11.6 -  15.9 g/dL   HCT 36.1  34.8 - 46.6 %   Platelets 284  145 - 400 10e3/uL   MCV 97.0  79.5 - 101.0 fL   MCH 29.6  25.1 - 34.0 pg   MCHC 30.5 (*) 31.5 - 36.0 g/dL   RBC 3.72  3.70 - 5.45 10e6/uL   RDW 16.7 (*) 11.2 - 14.5 %   lymph# 36.8 (*) 0.9 - 3.3 10e3/uL   MONO# 0.6  0.1 - 0.9 10e3/uL   Eosinophils Absolute 0.4  0.0 - 0.5 10e3/uL   Basophils Absolute 0.1  0.0 - 0.1 10e3/uL   NEUT% 12.0 (*) 38.4 - 76.8 %   LYMPH% 85.5 (*) 14.0 - 49.7 %   MONO% 1.3  0.0 - 14.0 %   EOS% 0.9  0.0 - 7.0 %   BASO% 0.3  0.0 - 2.0 %  TECHNOLOGIST REVIEW     Status: None   Collection Time    08/16/13 12:49 PM      Result Value Ref Range   Technologist Review Variant lymphs present, smudge cells    LACTATE DEHYDROGENASE (CC13)     Status: None   Collection Time    08/16/13 12:49 PM      Result Value Ref Range   LDH 240  125 - 245 U/L  COMPREHENSIVE METABOLIC PANEL (0000000)     Status: Abnormal   Collection Time    08/16/13 12:50 PM      Result Value Ref Range   Sodium 140  136 - 145 mEq/L   Potassium 5.3 (*) 3.5 - 5.1 mEq/L   Chloride 111 (*) 98 - 109 mEq/L   CO2 19 (*) 22 - 29 mEq/L  Glucose 124  70 - 140 mg/dl   BUN 38.9 (*) 7.0 - 26.0 mg/dL   Creatinine 2.0 (*) 0.6 - 1.1 mg/dL   Total Bilirubin 0.29  0.20 - 1.20 mg/dL   Alkaline Phosphatase 95  40 - 150 U/L   AST 26  5 - 34 U/L   ALT 24  0 - 55 U/L   Total Protein 6.1 (*) 6.4 - 8.3 g/dL   Albumin 3.5  3.5 - 5.0 g/dL   Calcium 9.1  8.4 - 10.4 mg/dL   Anion Gap 10  3 - 11 mEq/L       Labs:  Lab Results  Component Value Date   WBC 43.0* 08/16/2013   HGB 11.0* 08/16/2013   HCT 36.1 08/16/2013   MCV 97.0 08/16/2013   PLT 284 08/16/2013   NEUTROABS 5.2 08/16/2013      Chemistry      Component Value Date/Time   NA 140 08/16/2013 1250   NA 140 05/22/2013 1421   K 5.3* 08/16/2013 1250   K 5.4* 05/22/2013 1421   CL 106 05/22/2013 1421   CL 110* 05/28/2012 1259   CO2 19* 08/16/2013 1250   CO2 23 05/22/2013 1421   BUN 38.9* 08/16/2013 1250   BUN  52* 05/22/2013 1421   CREATININE 2.0* 08/16/2013 1250   CREATININE 2.14* 05/22/2013 1421   CREATININE 1.9* 02/08/2013 0854      Component Value Date/Time   CALCIUM 9.1 08/16/2013 1250   CALCIUM 9.3 05/22/2013 1421   ALKPHOS 95 08/16/2013 1250   ALKPHOS 71 07/09/2013 1002   AST 26 08/16/2013 1250   AST 24 07/09/2013 1002   ALT 24 08/16/2013 1250   ALT 21 07/09/2013 1002   BILITOT 0.29 08/16/2013 1250   BILITOT 0.4 07/09/2013 1002      RADIOGRAPHIC STUDIES: None  ASSESSMENT: Christine Santos 68 y.o. female with a history of CLL - Plan: CBC with Differential, Basic metabolic panel (Bmet) - [facility], Magnesium - [facility], Lactate dehydrogenase (LDH) - [facility]   PLAN:  #1 CLL. Her B. symptoms and lymphocytosis  appear to be stable. Her creatinine is stable since her last visit. On the prior visits, Dr. Lamonte Sakai discussed with the patient the option of proceeding with salvage chemotherapy with ibrutinib then or waiting to see if she became more symptomatic and/or kidney function decreased. That time she preferred to wait. As there is no absolute indication to resume chemotherapy, we continued observation. Moreover,  given the results of today's lab that shows all improvement in her creatinine and a stable lymphocytosis, the patient agrees to continue active observation.   #2 hypertension.  Today her blood pressure was mildly elevated.  She is on hydrochlorothiazide, lisinopril, metoprolol, clonidine patch.  We we advised the patient to continue to check her blood pressure in the setting of her home and to also contact her primary care physician should her blood pressure consistently over140/90. She will continue her low-salt diet.   #3 diabetes mellitus. She is on insulin per her primary care physician.  #4 chronic renal insufficiency likely secondary to #2, #3.  Her creatinine today is 2.0  In the past, she had a renal biopsy which showed CLL. In the future, if there is concern for how to attribute worsening renal function  either 2 hypertension, diabetes or progression of CLL, a renal biopsy may be considered.  #5 hyperlipidemia. She is on fish oil and atorvastatin that her primary care physician is managing.  #6 peripheral vascular disease.  She is on aspirin and Plavix per cardiologist she had a stent placed in 2012 and recently re-done this year.   #7 Hyperkalemia.  Likely secondary to medications, i.e., lisinopril.  We will give her one dose of kayexalate (she had some remaining from her prior visit).  She was provided hyperkalemia handout.   #8 followup. Patient instructed to followup in 2 months at that time we will check a CBC, CMP.   All questions were answered. The patient knows to call the clinic with any problems, questions or concerns. We can certainly see the patient much sooner if necessary.  Patient was provided and after visit summary.  I spent 15 minutes counseling the patient face to face. The total time spent in the appointment was 25 minutes.    Christine Virden, MD 08/16/2013 3:51 PM

## 2013-08-16 NOTE — Telephone Encounter (Signed)
gv adn rpinted aptp sched and avs for ptj for Sept

## 2013-08-16 NOTE — Patient Instructions (Signed)
Lisinopril tablets What is this medicine? LISINOPRIL (lyse IN oh pril) is an ACE inhibitor. This medicine is used to treat high blood pressure and heart failure. It is also used to protect the heart immediately after a heart attack. This medicine may be used for other purposes; ask your health care provider or pharmacist if you have questions. COMMON BRAND NAME(S): Prinivil, Zestril What should I tell my health care provider before I take this medicine? They need to know if you have any of these conditions: -diabetes -heart or blood vessel disease -immune system disease like lupus or scleroderma -kidney disease -low blood pressure -previous swelling of the tongue, face, or lips with difficulty breathing, difficulty swallowing, hoarseness, or tightening of the throat -an unusual or allergic reaction to lisinopril, other ACE inhibitors, insect venom, foods, dyes, or preservatives -pregnant or trying to get pregnant -breast-feeding How should I use this medicine? Take this medicine by mouth with a glass of water. Follow the directions on your prescription label. You may take this medicine with or without food. Take your medicine at regular intervals. Do not stop taking this medicine except on the advice of your doctor or health care professional. Talk to your pediatrician regarding the use of this medicine in children. Special care may be needed. While this drug may be prescribed for children as young as 30 years of age for selected conditions, precautions do apply. Overdosage: If you think you have taken too much of this medicine contact a poison control center or emergency room at once. NOTE: This medicine is only for you. Do not share this medicine with others. What if I miss a dose? If you miss a dose, take it as soon as you can. If it is almost time for your next dose, take only that dose. Do not take double or extra doses. What may interact with this medicine? -diuretics -lithium -NSAIDs,  medicines for pain and inflammation, like ibuprofen or naproxen -over-the-counter herbal supplements like hawthorn -potassium salts or potassium supplements -salt substitutes This list may not describe all possible interactions. Give your health care provider a list of all the medicines, herbs, non-prescription drugs, or dietary supplements you use. Also tell them if you smoke, drink alcohol, or use illegal drugs. Some items may interact with your medicine. What should I watch for while using this medicine? Visit your doctor or health care professional for regular check ups. Check your blood pressure as directed. Ask your doctor what your blood pressure should be, and when you should contact him or her. Call your doctor or health care professional if you notice an irregular or fast heart beat. Women should inform their doctor if they wish to become pregnant or think they might be pregnant. There is a potential for serious side effects to an unborn child. Talk to your health care professional or pharmacist for more information. Check with your doctor or health care professional if you get an attack of severe diarrhea, nausea and vomiting, or if you sweat a lot. The loss of too much body fluid can make it dangerous for you to take this medicine. You may get drowsy or dizzy. Do not drive, use machinery, or do anything that needs mental alertness until you know how this drug affects you. Do not stand or sit up quickly, especially if you are an older patient. This reduces the risk of dizzy or fainting spells. Alcohol can make you more drowsy and dizzy. Avoid alcoholic drinks. Avoid salt substitutes unless you are told  otherwise by your doctor or health care professional. Do not treat yourself for coughs, colds, or pain while you are taking this medicine without asking your doctor or health care professional for advice. Some ingredients may increase your blood pressure. What side effects may I notice from  receiving this medicine? Side effects that you should report to your doctor or health care professional as soon as possible: -abdominal pain with or without nausea or vomiting -allergic reactions like skin rash or hives, swelling of the hands, feet, face, lips, throat, or tongue -dark urine -difficulty breathing -dizzy, lightheaded or fainting spell -fever or sore throat -irregular heart beat, chest pain -pain or difficulty passing urine -redness, blistering, peeling or loosening of the skin, including inside the mouth -unusually weak -yellowing of the eyes or skin Side effects that usually do not require medical attention (report to your doctor or health care professional if they continue or are bothersome): -change in taste -cough -decreased sexual function or desire -headache -sun sensitivity -tiredness This list may not describe all possible side effects. Call your doctor for medical advice about side effects. You may report side effects to FDA at 1-800-FDA-1088. Where should I keep my medicine? Keep out of the reach of children. Store at room temperature between 15 and 30 degrees C (59 and 86 degrees F). Protect from moisture. Keep container tightly closed. Throw away any unused medicine after the expiration date. NOTE: This sheet is a summary. It may not cover all possible information. If you have questions about this medicine, talk to your doctor, pharmacist, or health care provider.  2015, Elsevier/Gold Standard. (2007-07-16 17:36:32) Hyperkalemia Hyperkalemia is when you have too much potassium in your blood. This can be a life-threatening condition. Potassium is normally removed (excreted) from the body by the kidneys. CAUSES  The potassium level in your body can become too high for the following reasons:  You take in too much potassium. You can do this by:  Using salt substitutes. They contain large amounts of potassium.  Taking potassium supplements from your  caregiver. The dose may be too high for you.  Eating foods or taking nutritional products with potassium.  You excrete too little potassium. This can happen if:  Your kidneys are not functioning properly. Kidney (renal) disease is a very common cause of hyperkalemia.  You are taking medicines that lower your excretion of potassium, such as certain diuretic medicines.  You have an adrenal gland disease called Addison's disease.  You have a urinary tract obstruction, such as kidney stones.  You are on treatment to mechanically clean your blood (dialysis) and you skip a treatment.  You release a high amount of potassium from your cells into your blood. You may have a condition that causes potassium to move from your cells to your bloodstream. This can happen with:  Injury to muscles or other tissues. Most potassium is stored in the muscles.  Severe burns or infections.  Acidic blood plasma (acidosis). Acidosis can result from many diseases, such as uncontrolled diabetes. SYMPTOMS  Usually, there are no symptoms unless the potassium is dangerously high or has risen very quickly. Symptoms may include:  Irregular or very slow heartbeat.  Feeling sick to your stomach (nauseous).  Tiredness (fatigue).  Nerve problems such as tingling of the skin, numbness of the hands or feet, weakness, or paralysis. DIAGNOSIS  A simple blood test can measure the amount of potassium in your body. An electrocardiogram test of the heart can also help make the diagnosis.  The heart may beat dangerously fast or slow down and stop beating with severe hyperkalemia.  TREATMENT  Treatment depends on how bad the condition is and on the underlying cause.  If the hyperkalemia is an emergency (causing heart problems or paralysis), many different medicines can be used alone or together to lower the potassium level briefly. This may include an insulin injection even if you are not diabetic. Emergency dialysis may be  needed to remove potassium from the body.  If the hyperkalemia is less severe or dangerous, the underlying cause is treated. This can include taking medicines if needed. Your prescription medicines may be changed. You may also need to take a medicine to help your body get rid of potassium. You may need to eat a diet low in potassium. HOME CARE INSTRUCTIONS   Take medicines and supplements as directed by your caregiver.  Do not take any over-the-counter medicines, supplements, natural products, herbs, or vitamins without reviewing them with your caregiver. Certain supplements and natural food products can have high amounts of potassium. Other products (such as ibuprofen) can damage weak kidneys and raise your potassium.  You may be asked to do repeat lab tests. Be sure to follow these directions.  If you have kidney disease, you may need to follow a low potassium diet. SEEK MEDICAL CARE IF:   You notice an irregular or very slow heartbeat.  You feel lightheaded.  You develop weakness that is unusual for you. SEEK IMMEDIATE MEDICAL CARE IF:   You have shortness of breath.  You have chest discomfort.  You pass out (faint). MAKE SURE YOU:   Understand these instructions.  Will watch your condition.  Will get help right away if you are not doing well or get worse. Document Released: 12/31/2001 Document Revised: 04/04/2011 Document Reviewed: 04/17/2013 [facility] Patient Information 2015 [place], [place]. This information is not intended to replace advice given to you by your health care provider. Make sure you discuss any questions you have with your health care provider.

## 2013-08-19 ENCOUNTER — Telehealth: Payer: Self-pay | Admitting: Family Medicine

## 2013-08-19 NOTE — Telephone Encounter (Signed)
Patient scheduled with Dr. Andria Frames Wednesday 7/29 to discuss this futher

## 2013-08-19 NOTE — Telephone Encounter (Signed)
Patient states that Dr. Juliann Mule stated her potassium has been high over 6-8 months also along with her high blood pressure readings lately. She was given a laxative by him also to help. She stated that she was told  By him that the Lisinopril would be doing this. Patient wants to know if this would cause this with other medications that she is on.

## 2013-08-19 NOTE — Telephone Encounter (Signed)
Dr. Juliann Mule is correct - AND it is a complicated story.  She has CHF and DM kidney disease which are both compelling reasons to stay on an ACE.   Two suggestions: 1. She should either Google low potassium diet (avoid high potassium foods) or see Dr. Jenne Campus to review. 2. Make an appointment to see me so we can discuss further.

## 2013-08-19 NOTE — Telephone Encounter (Signed)
Pt called and would like Dr. Andria Frames to call her concerning her BP medication. Her cancer doctor said that her potassium has been high over the last few visits and thinks it could be caused by her BP medication. Please call her at [phone]. jw

## 2013-08-21 ENCOUNTER — Encounter: Payer: Self-pay | Admitting: Family Medicine

## 2013-08-21 ENCOUNTER — Ambulatory Visit (INDEPENDENT_AMBULATORY_CARE_PROVIDER_SITE_OTHER): Payer: Medicare Other | Admitting: Family Medicine

## 2013-08-21 VITALS — BP 160/67 | HR 64 | Ht 66.0 in | Wt 184.5 lb

## 2013-08-21 DIAGNOSIS — N183 Chronic kidney disease, stage 3 unspecified: Secondary | ICD-10-CM

## 2013-08-21 DIAGNOSIS — I10 Essential (primary) hypertension: Secondary | ICD-10-CM

## 2013-08-21 DIAGNOSIS — E1129 Type 2 diabetes mellitus with other diabetic kidney complication: Secondary | ICD-10-CM

## 2013-08-21 DIAGNOSIS — N058 Unspecified nephritic syndrome with other morphologic changes: Secondary | ICD-10-CM

## 2013-08-21 DIAGNOSIS — I5022 Chronic systolic (congestive) heart failure: Secondary | ICD-10-CM

## 2013-08-21 DIAGNOSIS — E1121 Type 2 diabetes mellitus with diabetic nephropathy: Secondary | ICD-10-CM

## 2013-08-21 DIAGNOSIS — E875 Hyperkalemia: Secondary | ICD-10-CM

## 2013-08-21 DIAGNOSIS — E118 Type 2 diabetes mellitus with unspecified complications: Secondary | ICD-10-CM

## 2013-08-21 DIAGNOSIS — IMO0002 Reserved for concepts with insufficient information to code with codable children: Secondary | ICD-10-CM

## 2013-08-21 DIAGNOSIS — E1165 Type 2 diabetes mellitus with hyperglycemia: Secondary | ICD-10-CM

## 2013-08-21 LAB — POCT GLYCOSYLATED HEMOGLOBIN (HGB A1C): Hemoglobin A1C: 6.4

## 2013-08-21 MED ORDER — FUROSEMIDE 40 MG PO TABS: 40.0000 mg | ORAL_TABLET | Freq: Every day | ORAL | Status: DC

## 2013-08-21 NOTE — Patient Instructions (Addendum)
Stop the Hydrochlorothiazide + HCTZ Instead, I sent a new prescription for furosemide to the drug store. It could do several things.  1. Less swelling 2. Lower your potassium 3. Less urinating in the middle of the night 4. Better blood pressure control  See me in two weeks to recheck your blood pressure and potassium

## 2013-08-22 NOTE — Assessment & Plan Note (Signed)
Switch from HCTZ to lasix should help.

## 2013-08-22 NOTE — Assessment & Plan Note (Signed)
With her renal dysfunction, HCTZ no longer an effective diuretic.  Switch to lasix.

## 2013-08-22 NOTE — Assessment & Plan Note (Signed)
Poor control and volume overloaded.  Will switch HCTZ to lasix and hope for improved control

## 2013-08-22 NOTE — Progress Notes (Signed)
   Subjective:    Patient ID: Christine Santos, female    DOB: [birth year], 68 y.o.   MRN: [id number]  HPI  Main complaint is persistent hyperkalemia.  Ms Roesel has had hyperkalemia on multiple blood draws by oncology.  The ACE inhibitor is the only obvious cause.  Because she has diabetic nephropathy and systolic dysfunction CHF, she needs to remain on an ACE.  Silva Bandy is both distasteful and causes hyperglycemia  BP is up despite five drug therapy  CHF - denies Dyspnea, orthopnea or PND.  Does have leg swelling and weight is up.  DM great control now on mealtime coverage.    Review of Systems     Objective:   Physical ExamLungs clear VS noted. Cardiac RRR without m or g Abd benign Ext 2+ edema        Assessment & Plan:

## 2013-08-22 NOTE — Assessment & Plan Note (Signed)
Needs to remain on ACE.  Will add lasix and DC HCTZ

## 2013-08-22 NOTE — Assessment & Plan Note (Signed)
Mild volume overload.  Switch to lasix.

## 2013-08-22 NOTE — Assessment & Plan Note (Signed)
Great control now on mealtime coverage.

## 2013-09-11 ENCOUNTER — Encounter: Payer: Self-pay | Admitting: Family Medicine

## 2013-09-11 ENCOUNTER — Other Ambulatory Visit (HOSPITAL_COMMUNITY): Payer: Self-pay | Admitting: *Deleted

## 2013-09-11 ENCOUNTER — Ambulatory Visit (INDEPENDENT_AMBULATORY_CARE_PROVIDER_SITE_OTHER): Payer: Medicare Other | Admitting: Family Medicine

## 2013-09-11 VITALS — BP 142/60 | HR 56 | Temp 98.3°F | Ht 65.0 in | Wt 182.0 lb

## 2013-09-11 DIAGNOSIS — I10 Essential (primary) hypertension: Secondary | ICD-10-CM

## 2013-09-11 DIAGNOSIS — I739 Peripheral vascular disease, unspecified: Secondary | ICD-10-CM

## 2013-09-11 DIAGNOSIS — E875 Hyperkalemia: Secondary | ICD-10-CM

## 2013-09-11 DIAGNOSIS — N183 Chronic kidney disease, stage 3 unspecified: Secondary | ICD-10-CM

## 2013-09-11 DIAGNOSIS — I5022 Chronic systolic (congestive) heart failure: Secondary | ICD-10-CM

## 2013-09-11 DIAGNOSIS — E1129 Type 2 diabetes mellitus with other diabetic kidney complication: Secondary | ICD-10-CM

## 2013-09-11 DIAGNOSIS — N058 Unspecified nephritic syndrome with other morphologic changes: Secondary | ICD-10-CM

## 2013-09-11 DIAGNOSIS — E1121 Type 2 diabetes mellitus with diabetic nephropathy: Secondary | ICD-10-CM

## 2013-09-11 LAB — BASIC METABOLIC PANEL
BUN: 56 mg/dL — AB (ref 6–23)
CALCIUM: 9.1 mg/dL (ref 8.4–10.5)
CHLORIDE: 105 meq/L (ref 96–112)
CO2: 26 mEq/L (ref 19–32)
CREATININE: 2.48 mg/dL — AB (ref 0.50–1.10)
Glucose, Bld: 115 mg/dL — ABNORMAL HIGH (ref 70–99)
Potassium: 4.8 mEq/L (ref 3.5–5.3)
Sodium: 141 mEq/L (ref 135–145)

## 2013-09-11 MED ORDER — FUROSEMIDE 40 MG PO TABS: 40.0000 mg | ORAL_TABLET | Freq: Two times a day (BID) | ORAL | Status: DC

## 2013-09-11 NOTE — Patient Instructions (Signed)
I am glad you are feeling better. I want you to take the furosemide twice daily.  Not before bedtime I will call with the blood work results. You will certainly need a flu shot.  We typically get our supply in by Oct 1.

## 2013-09-12 NOTE — Assessment & Plan Note (Signed)
Now at goal but borderline.  Additional lasix should help.

## 2013-09-12 NOTE — Assessment & Plan Note (Signed)
Symptomatically improved but still mild volume overload.  Increase lasix to bid.

## 2013-09-12 NOTE — Assessment & Plan Note (Signed)
Recheck BMP.  Needs to stay on ACE for mortality benefit.

## 2013-09-12 NOTE — Assessment & Plan Note (Signed)
Recheck BMP on lasix

## 2013-09-12 NOTE — Progress Notes (Signed)
   Subjective:    Patient ID: Christine Santos, female    DOB: 1945/03/03, 68 y.o.   MRN: [id number]  HPI   Follow up hypertension, hyperkalemia and CHF. Feels better since switch to lasix.  Less leg swelling but some still present.  No other complaints.    Review of Systems     Objective:   Physical Exam Lungs clear Cardiac RRR without m or g Ext still 2+ edema.       Assessment & Plan:

## 2013-09-17 ENCOUNTER — Ambulatory Visit (HOSPITAL_COMMUNITY): Payer: Medicare Other | Attending: Cardiology | Admitting: Cardiology

## 2013-09-17 DIAGNOSIS — I70219 Atherosclerosis of native arteries of extremities with intermittent claudication, unspecified extremity: Secondary | ICD-10-CM | POA: Insufficient documentation

## 2013-09-17 DIAGNOSIS — I739 Peripheral vascular disease, unspecified: Secondary | ICD-10-CM

## 2013-09-17 NOTE — Progress Notes (Signed)
Bilateral lower arterial duplex and ABI performed

## 2013-09-18 ENCOUNTER — Other Ambulatory Visit: Payer: Self-pay | Admitting: Family Medicine

## 2013-09-18 ENCOUNTER — Other Ambulatory Visit: Payer: Self-pay | Admitting: Interventional Cardiology

## 2013-09-18 NOTE — Telephone Encounter (Signed)
Refill called in and is appropriate.

## 2013-09-24 ENCOUNTER — Encounter: Payer: Self-pay | Admitting: Cardiovascular Disease

## 2013-09-24 ENCOUNTER — Ambulatory Visit (INDEPENDENT_AMBULATORY_CARE_PROVIDER_SITE_OTHER): Payer: Medicare Other | Admitting: Cardiovascular Disease

## 2013-09-24 VITALS — BP 122/48 | HR 52 | Ht 65.0 in | Wt 182.1 lb

## 2013-09-24 DIAGNOSIS — I739 Peripheral vascular disease, unspecified: Secondary | ICD-10-CM

## 2013-09-24 NOTE — Patient Instructions (Signed)
Your physician wants you to follow-up in: 6 MONTHS with Dr Arida.  You will receive a reminder letter in the mail two months in advance. If you don't receive a letter, please call our office to schedule the follow-up appointment.  Your physician recommends that you continue on your current medications as directed. Please refer to the Current Medication list given to you today.  

## 2013-09-24 NOTE — Progress Notes (Signed)
Date:  09/24/2013   ID:  Christine Santos, DOB April 25, 1945, MRN [id number]  PCP:  Zigmund Gottron, MD  Primary cardiologist: Dr. Irish Lack    History of Present Illness: Christine Santos is a 68 y.o. female who is here today for a follow up visit regarding PAD. She has known history of CAD s/p CABG in 1999. She has multiple chronic medical conditions that include diabetes mellitus, hypertension and hyperlipidemia. She has known history of peripheral arterial disease with bilateral lower extremity revascularization. Most recently the right SFA was treated with rotational atherectomy and PTA in December 2014. Previous RSFA stent was patent. Previous LSFA atherectomy site was patent. She reports mild bilateral calf discomfort after walking about 2 blocks. She is more bothered by foot pain both at rest and with activities . she used to get more cramps in her legs at night. She reports improved symptoms over all sense the most recent procedure. She is not a smoker.  Recent noninvasive evaluation with normal ABI on right and nonsuppressible on left.  Duplex showed significant left SFA restenosis.     Wt Readings from Last 3 Encounters:  09/24/13 182 lb 1.9 oz (82.609 kg)  09/11/13 182 lb (82.555 kg)  08/21/13 184 lb 8 oz (83.689 kg)     Past Medical History  Diagnosis Date  . CLL (chronic lymphoblastic leukemia)   . Hyperlipidemia   . Hypertension   . Diastolic heart failure   . CAD (coronary artery disease)     s/p CABG in 1999  . Peripheral vascular disease   . Heart murmur   . Myocardial infarction     "I had one but didn't know it" (01/22/2013)  . PAD (peripheral artery disease)   . IDDM (insulin dependent diabetes mellitus)   . Anemia 08/29/2011  . GERD (gastroesophageal reflux disease)   . H/O hiatal hernia   . Arthritis     "legs" (01/22/2013)    Current Outpatient Prescriptions  Medication Sig Dispense Refill  . allopurinol (ZYLOPRIM) 300 MG tablet TAKE 1/2 TABLET BY MOUTH  ONCE DAILY  45 tablet  3  . Ascorbic Acid (VITAMIN C) 100 MG tablet Take 100 mg by mouth daily.      Marland Kitchen aspirin 81 MG tablet Take 81 mg by mouth daily.      Marland Kitchen atorvastatin (LIPITOR) 80 MG tablet TAKE 1 TABLET BY MOUTH EVERY DAY  90 tablet  2  . Choline Fenofibrate (FENOFIBRIC ACID) 45 MG CPDR TAKE 1 CAPSULE BY MOUTH DAILY  90 capsule  2  . cloNIDine (CATAPRES - DOSED IN MG/24 HR) 0.2 mg/24hr patch APPLY 1 PATCH ON THE SKIN AND CHANGE WEEKLY  12 patch  3  . clopidogrel (PLAVIX) 75 MG tablet TAKE 1 TABLET BY MOUTH DAILY  90 tablet  3  . felodipine (PLENDIL) 10 MG 24 hr tablet TAKE 2 TABLETS BY MOUTH EVERY DAY  180 tablet  1  . ferrous sulfate 325 (65 FE) MG tablet Take 1 tablet (325 mg total) by mouth daily with breakfast.    3  . furosemide (LASIX) 40 MG tablet Take 40 mg by mouth 2 (two) times daily with breakfast and lunch. Taking total of 80 mg daily.      Marland Kitchen GNP GARLIC EXTRACT PO Take 1 tablet by mouth daily.        . Insulin Glargine (LANTUS SOLOSTAR) 100 UNIT/ML SOPN Inject 35 Units into the skin daily.  5 pen  12  . insulin lispro (HUMALOG) 100 UNIT/ML  KiwkPen Inject 5 Units into the skin 3 (three) times daily.      Marland Kitchen lactulose (CHRONULAC) 10 GM/15ML solution Take 30 mLs (20 g total) by mouth daily as needed for moderate constipation.  120 mL  0  . lisinopril (PRINIVIL,ZESTRIL) 40 MG tablet TAKE 1 TABLET BY MOUTH EVERY DAY  90 tablet  3  . Melatonin 5 MG CAPS Take by mouth at bedtime.      . metoprolol tartrate (LOPRESSOR) 25 MG tablet TAKE 1/2 TABLET BY MOUTH TWICE DAILY  90 tablet  3  . Multiple Vitamins-Minerals (MULTIVITAMINS THER. W/MINERALS) TABS Take 1 tablet by mouth daily.        . Omega-3 Fatty Acids (FISH OIL) 1000 MG CAPS Take 2,000 mg by mouth 2 (two) times daily.       Marland Kitchen OVER THE COUNTER MEDICATION Take 1 tablet by mouth 2 (two) times daily. Macular Degenerative Eye Supplement      . pantoprazole (PROTONIX) 40 MG tablet TAKE 1 TABLET BY MOUTH EVERY DAY  90 tablet  3  .  PARoxetine (PAXIL) 20 MG tablet Take 20 mg by mouth daily.      . traMADol (ULTRAM) 50 MG tablet TAKE 1 TABLET BY MOUTH FOUR TIMES DAILY  360 tablet  1   No current facility-administered medications for this visit.    Allergies:    Allergies  Allergen Reactions  . Amoxicillin Rash    REACTION: rash  . Cephalexin Rash  . Codeine Phosphate Rash    REACTION: rash    Social History:  The patient  reports that she has never smoked. She has never used smokeless tobacco. She reports that she does not drink alcohol or use illicit drugs.   Family History:  The patient's family history includes Heart disease in her mother; Pneumonia in her father.   ROS:  Please see the history of present illness.  No nausea, vomiting.  No fevers, chills.  No focal weakness.  No dysuria. Leg pain as noted.  All other systems reviewed and negative.   PHYSICAL EXAM: VS:  BP 122/48  Pulse 52  Ht 5\' 5"  (1.651 m)  Wt 182 lb 1.9 oz (82.609 kg)  BMI 30.31 kg/m2 Well nourished, well developed, in no acute distress HEENT: normal Neck: no JVD, no carotid bruits Cardiac:  normal S1, S2; RRR;  Lungs:  clear to auscultation bilaterally, no wheezing, rhonchi or rales Abd: soft, nontender, no hepatomegaly Ext: no edema; 1+ left PT pulse; 0 and a + right DP pulse; normal femoral pulse Skin: warm and dry Neuro:   no focal abnormalities noted  EKG:  Sinus bradycardia, T wave inversion laterally.  No change.     ASSESSMENT AND PLAN:  1. Coronary atherosclerosis of native coronary artery  stable s/p CABG in 1996.   2. Atherosclerosis of native arteries of the extremities with intermittent claudication   she had atherectomy and balloon angioplasty of the right SFA in December of 2014. Previous atherectomy of the distal left SFA a few years ago. There is evidence of focal restenosis in left mid SFA.  Given that her symptoms are currently not lifestyle limiting, I recommend continuing aggressive medical therapy and a  walking program.  I asked her to notify me if symptoms worsen. Treatment options in the future include  drug coated balloons in order to decrease the risk of restenosis.   Signed, Kathlyn Sacramento, MD, [facility] 09/24/2013 10:30 AM

## 2013-10-15 ENCOUNTER — Telehealth: Payer: Self-pay | Admitting: Family Medicine

## 2013-10-15 NOTE — Telephone Encounter (Signed)
Pt called and was wondering if the doctor can call in something for a chest cold. She is not throwing up or fever. She has been taking cough syrup but this is not helping. jw

## 2013-10-15 NOTE — Telephone Encounter (Signed)
Called and discussed.  4 days of cough and feeling bad.  No fever.  Seems to be slowly improving.  Drinking fluids and BS under control.  Likely viral.  Discussed symptomatic treatment.  Given red flag symptoms for which she would need to be seen.

## 2013-10-17 ENCOUNTER — Other Ambulatory Visit (HOSPITAL_BASED_OUTPATIENT_CLINIC_OR_DEPARTMENT_OTHER): Payer: Medicare Other

## 2013-10-17 ENCOUNTER — Ambulatory Visit (HOSPITAL_BASED_OUTPATIENT_CLINIC_OR_DEPARTMENT_OTHER): Payer: Medicare Other | Admitting: Hematology

## 2013-10-17 VITALS — BP 158/56 | HR 57 | Temp 97.7°F | Resp 18 | Ht 65.0 in | Wt 185.2 lb

## 2013-10-17 DIAGNOSIS — N189 Chronic kidney disease, unspecified: Secondary | ICD-10-CM

## 2013-10-17 DIAGNOSIS — I10 Essential (primary) hypertension: Secondary | ICD-10-CM

## 2013-10-17 DIAGNOSIS — C9110 Chronic lymphocytic leukemia of B-cell type not having achieved remission: Secondary | ICD-10-CM

## 2013-10-17 LAB — BASIC METABOLIC PANEL (CC13)
ANION GAP: 11 meq/L (ref 3–11)
BUN: 51 mg/dL — ABNORMAL HIGH (ref 7.0–26.0)
CO2: 23 mEq/L (ref 22–29)
Calcium: 9.4 mg/dL (ref 8.4–10.4)
Chloride: 107 mEq/L (ref 98–109)
Creatinine: 2.6 mg/dL — ABNORMAL HIGH (ref 0.6–1.1)
GLUCOSE: 158 mg/dL — AB (ref 70–140)
POTASSIUM: 4.5 meq/L (ref 3.5–5.1)
SODIUM: 141 meq/L (ref 136–145)

## 2013-10-17 LAB — CBC WITH DIFFERENTIAL/PLATELET
BASO%: 0.7 % (ref 0.0–2.0)
Basophils Absolute: 0.3 10*3/uL — ABNORMAL HIGH (ref 0.0–0.1)
EOS ABS: 0.4 10*3/uL (ref 0.0–0.5)
EOS%: 0.9 % (ref 0.0–7.0)
HCT: 35.6 % (ref 34.8–46.6)
HGB: 10.9 g/dL — ABNORMAL LOW (ref 11.6–15.9)
LYMPH#: 36.6 10*3/uL — AB (ref 0.9–3.3)
LYMPH%: 83.7 % — ABNORMAL HIGH (ref 14.0–49.7)
MCH: 29.8 pg (ref 25.1–34.0)
MCHC: 30.6 g/dL — ABNORMAL LOW (ref 31.5–36.0)
MCV: 97.4 fL (ref 79.5–101.0)
MONO#: 0.8 10*3/uL (ref 0.1–0.9)
MONO%: 1.7 % (ref 0.0–14.0)
NEUT%: 13 % — ABNORMAL LOW (ref 38.4–76.8)
NEUTROS ABS: 5.7 10*3/uL (ref 1.5–6.5)
Platelets: 334 10*3/uL (ref 145–400)
RBC: 3.66 10*6/uL — AB (ref 3.70–5.45)
RDW: 15.5 % — AB (ref 11.2–14.5)
WBC: 43.8 10*3/uL — AB (ref 3.9–10.3)

## 2013-10-17 LAB — TECHNOLOGIST REVIEW

## 2013-10-17 LAB — LACTATE DEHYDROGENASE (CC13): LDH: 242 U/L (ref 125–245)

## 2013-10-17 LAB — MAGNESIUM (CC13): Magnesium: 2 mg/dl (ref 1.5–2.5)

## 2013-10-20 ENCOUNTER — Encounter: Payer: Self-pay | Admitting: Hematology

## 2013-10-20 NOTE — Progress Notes (Signed)
[facility] OFFICE PROGRESS NOTE DATE OF VISIT: 10/18/2013  Christine Gottron, MD [place] [address]  DIAGNOSIS: CLL (chronic lymphocytic leukemia) - Plan: CBC with Differential, Basic metabolic panel (Bmet) - [facility], Ambulatory referral to Nephrology  Chief Complaint  Patient presents with  . Follow-up    CURRENT THERAPY:  Active observation.  PAST THERAPY: Monthly chlorambucil [place] January 2011 in January 2012.  INTERVAL HISTORY:  Christine Santos 68 y.o. 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 She was last seen by Dr Juliann Mule on 08/16/2013. Today she is accompanied by her significant other Mr. Christine Santos and her sister Christine Santos.  She reports a normal appetite and denies weight loss. She denies vomiting, diarrhea. She denies fever, anorexia, weight loss, headache, visual changes, confusion, drenching night sweats, palpable lymph node swelling, mucositis, or odynophagia, dysphagia, nausea, jaundice, chest pain, palpitations, dyspnea, dyspnea on exertion, productive cough, bleeding, offices, abdominal pain, early satiety, melena, hematochezia, hematuria, weakness. She reports having her colonoscopy done several months ago. CBC today reveals a stable white count at 43.8, hemoglobin 10.9 stable, platelet count 334, Absolute Lymphocyte count 36,600 and last visit it was 36,800 so fairly stable.   Her renal function is slowly deteriorating.   2 months ago serum creatinine was 2.0 1 month ago serum creatinine was 2.48 Today her serum creatinine is 2.6  She is not on any systemic therapy for her CLL. Recently her diuretics were changed. She also has diabetes and that could be affecting her creatinine also. I will set her up with a nephrologist.   MEDICAL HISTORY: Past Medical History  Diagnosis Date  . CLL (chronic  lymphoblastic leukemia)   . Hyperlipidemia   . Hypertension   . Diastolic heart failure   . CAD (coronary artery disease)     s/p CABG in 1999  . Peripheral vascular disease   . Heart murmur   . Myocardial infarction     "I had one but didn't know it" (01/22/2013)  . PAD (peripheral artery disease)   . IDDM (insulin dependent diabetes mellitus)   . Anemia 08/29/2011  . GERD (gastroesophageal reflux disease)   . H/O hiatal hernia   . Arthritis     "legs" (01/22/2013)    INTERIM HISTORY: has CLL; DIABETES MELLITUS, II, COMPLICATIONS; HYPERCHOLESTEROLEMIA; OBESITY, NOS; HYPERTENSION, BENIGN SYSTEMIC; CORONARY, ARTERIOSCLEROSIS; SINUS BRADYCARDIA; Chronic systolic heart failure; PERIPHERAL VASCULAR DISEASE; REFLUX ESOPHAGITIS; IRRITABLE BOWEL SYNDROME; Chronic kidney disease, stage III (moderate); CYSTOCELE/RECTOCELE/PROLAPSE,UNSPEC.; Osteoarthritis, multiple sites; Anxiety; Stress incontinence, female; Gout; Anemia; Foot pain; Osteopenia; Diabetic retinopathy; Macular degeneration; Diabetic neuropathy; Diabetic nephropathy; and Hyperkalemia on her problem list.    ALLERGIES:  is allergic to amoxicillin; cephalexin; and codeine phosphate.  MEDICATIONS: has a current medication list which includes the following prescription(s): allopurinol, vitamin c, aspirin, atorvastatin, fenofibric acid, clonidine, clopidogrel, felodipine, ferrous sulfate, furosemide, garlic, insulin glargine, insulin lispro, lactulose, lisinopril, melatonin, metoprolol tartrate, multivitamins ther. w/minerals, fish oil, OVER THE COUNTER MEDICATION, pantoprazole, paroxetine, and tramadol.  SURGICAL HISTORY:  Past Surgical History  Procedure Laterality Date  . Cataract extraction w/ intraocular lens  implant, bilateral Bilateral ?2011  . Heel spur excision Bilateral 1970's  . Cholecystectomy  1993  . Tubal ligation  1980's  . Cardiac catheterization      "I've had 2-4" (01/22/2013)  . Coronary artery bypass graft   10/1994    "CABG X3"  .  Shoulder open rotator cuff repair Bilateral 1990's    "2 times on 1 side; once on the other"  . Angioplasty / stenting iliac      /notes 11/25/2010 (01/22/2013)  . Angioplasty / stenting femoral Right 09/2010    SFA/notes 11/25/2010 (01/22/2013)  . Angioplasty / stenting femoral Left 11/2010    SFA/notes 11/25/2010 (01/22/2013)  . Transluminal atherectomy femoral artery Right 01/22/2013    & balloon    REVIEW OF SYSTEMS:   Constitutional: Denies fevers, chills or abnormal weight loss Eyes: Denies blurriness of vision Ears, nose, mouth, throat, and face: Denies mucositis or sore throat Respiratory: Denies cough, dyspnea or wheezes Cardiovascular: Denies palpitation, chest discomfort or lower extremity swelling Gastrointestinal:  Denies nausea, heartburn or change in bowel habits Skin: Denies abnormal skin rashes Lymphatics: Denies new lymphadenopathy or easy bruising Neurological:Denies numbness, tingling or new weaknesses Behavioral/Psych: Mood is stable, no new changes  All other systems were reviewed with the patient and are negative.  PHYSICAL EXAMINATION: ECOG PERFORMANCE STATUS: 1  Blood pressure 158/56, pulse 57, temperature 97.7 F (36.5 C), temperature source Oral, resp. rate 18, height 5' 5" (1.651 m), weight 185 lb 3.2 oz (84.006 kg).  GENERAL:alert, no distress and comfortable anxious SKIN: skin color, texture, turgor are normal, no rashes or significant lesions EYES: normal, Conjunctiva are pink and non-injected, sclera clear OROPHARYNX:no exudate, no erythema and lips, buccal mucosa, and tongue normal  NECK: supple, thyroid normal size, non-tender, without nodularity LYMPH:  no palpable lymphadenopathy in the cervical, axillary or supraclavicular LUNGS: clear to auscultation and percussion with normal breathing effort HEART: regular rate & rhythm and no murmurs and trace lower extremity versus trace edema ABDOMEN:abdomen soft, non-tender and  normal bowel sounds Musculoskeletal:no cyanosis of digits and no clubbing  NEURO: alert & oriented x 3 with fluent speech, no focal motor/sensory deficits   LABORATORY DATA: No results found for this or any previous visit (from the past 48 hour(s)).     Labs:  Lab Results  Component Value Date   WBC 43.8* 10/17/2013   HGB 10.9* 10/17/2013   HCT 35.6 10/17/2013   MCV 97.4 10/17/2013   PLT 334 10/17/2013   NEUTROABS 5.7 10/17/2013      Chemistry      Component Value Date/Time   NA 141 10/17/2013 1314   NA 141 09/11/2013 1408   K 4.5 10/17/2013 1314   K 4.8 09/11/2013 1408   CL 105 09/11/2013 1408   CL 110* 05/28/2012 1259   CO2 23 10/17/2013 1314   CO2 26 09/11/2013 1408   BUN 51.0* 10/17/2013 1314   BUN 56* 09/11/2013 1408   CREATININE 2.6* 10/17/2013 1314   CREATININE 2.48* 09/11/2013 1408   CREATININE 1.9* 02/08/2013 0854      Component Value Date/Time   CALCIUM 9.4 10/17/2013 1314   CALCIUM 9.1 09/11/2013 1408   ALKPHOS 95 08/16/2013 1250   ALKPHOS 71 07/09/2013 1002   AST 26 08/16/2013 1250   AST 24 07/09/2013 1002   ALT 24 08/16/2013 1250   ALT 21 07/09/2013 1002   BILITOT 0.29 08/16/2013 1250   BILITOT 0.4 07/09/2013 1002      RADIOGRAPHIC STUDIES:  Mammogram 03/19/2012 bi-rads category 1 study.  Bone density 10/03/11: +2.2 Normal by WHO criteria  ASSESSMENT: Christine Santos 67 y.o. female with a history of CLL (chronic lymphocytic leukemia) - Plan: CBC with Differential, Basic metabolic panel (Bmet) - [facility], Ambulatory referral to Nephrology   PLAN:   #1 CLL. Her B.   symptoms and lymphocytosis  appear to be stable. Her creatinine is trending up since her last visit. On the prior visits, Dr. Lamonte Sakai discussed with the patient the option of proceeding with salvage chemotherapy with ibrutinib then or waiting to see if she becme more symptomatic and/or kidney function decreased. That time she preferred to wait. As there is no absolute indication to resume chemotherapy, we continued  observation. Moreover,  given the results of today's lab that shows stable lymphocytosis, the patient agrees to continue active surveillance or observation or wait and watch approach.   #2 hypertension.  Today her blood pressure was mildly elevated.  She is on hydrochlorothiazide, lisinopril, metoprolol, clonidine patch.  We we advised the patient to continue to check her blood pressure in the setting of her home and to also contact her primary care physician should her blood pressure consistently over140/90. She will continue her low-salt diet.   #3 diabetes mellitus. She is on insulin per her primary care physician.  #4 chronic renal insufficiency likely secondary to #2, #3.  Her creatinine today is 2.6  In the past, she had a bone marrow biopsy which showed CLL. In the future, if there is concern for how to attribute worsening renal function either 2 hypertension, diabetes or progression of CLL, a renal biopsy may be considered.She will benefit from seeing a nephrologist who can monitor her renal functions, adjust medications, modify risk factors and make a decision whether a kidney biopsy is warranted now or not. Before we recommend any systemic therapy in future we will have to carefully review the renal profile of the systemic therapy.  #5 hyperlipidemia. She is on fish oil and atorvastatin that her primary care physician is managing.  #6 peripheral vascular disease. She is on aspirin and Plavix per cardiologist she had a stent placed in 2012 and recently re-done this year.   #7 followup. Patient instructed to followup in 3-4 months at that time we will check a CBC, CMP. That visit has been set up after the holidays in December with Dr Alvy Bimler based upon patient's request.  All questions were answered. The patient knows to call the clinic with any problems, questions or concerns. We can certainly see the patient much sooner if necessary.   I spent 15 minutes counseling the patient face to face.  The total time spent in the appointment was 25 minutes.    Christine Bell, MD Medical Hematologist/Oncologist [place] Pager: [phone] Office No: [phone]

## 2013-10-22 ENCOUNTER — Telehealth: Payer: Self-pay | Admitting: Hematology and Oncology

## 2013-10-22 NOTE — Telephone Encounter (Signed)
sw pt and advised on Jan 2016 appt....per Dr. Cletus Gash ok to take over pt care

## 2013-11-01 ENCOUNTER — Ambulatory Visit (INDEPENDENT_AMBULATORY_CARE_PROVIDER_SITE_OTHER): Payer: Medicare Other | Admitting: *Deleted

## 2013-11-01 DIAGNOSIS — Z23 Encounter for immunization: Secondary | ICD-10-CM

## 2013-11-19 ENCOUNTER — Other Ambulatory Visit: Payer: Self-pay | Admitting: Family Medicine

## 2013-11-21 ENCOUNTER — Telehealth: Payer: Self-pay

## 2013-11-21 ENCOUNTER — Telehealth: Payer: Self-pay | Admitting: *Deleted

## 2013-11-21 NOTE — Telephone Encounter (Signed)
lvm on referral line at [facility] that Dr Lona Kettle had ordered a referal on 10/17/13. Did not know if this was missed or in process. Called and s/w referal nurse. They did not receive referal. Prepared paperwork and faxed.

## 2013-11-21 NOTE — Telephone Encounter (Signed)
Faxed requested patient records and new pt referral form to [facility] at [phone].

## 2013-11-21 NOTE — Telephone Encounter (Signed)
Pt left VM states she has not heard anything about referral Dr. Lona Kettle made to [facility].  Message given to Sharmon Leyden,  Collaborative nurse w/ Dr. Lona Kettle to f/u.

## 2013-11-28 ENCOUNTER — Other Ambulatory Visit: Payer: Self-pay | Admitting: Nephrology

## 2013-11-28 DIAGNOSIS — N183 Chronic kidney disease, stage 3 unspecified: Secondary | ICD-10-CM

## 2013-12-03 ENCOUNTER — Ambulatory Visit
Admission: RE | Admit: 2013-12-03 | Discharge: 2013-12-03 | Disposition: A | Discharge status: Home / Self Care | Payer: Medicare Other | Source: Ambulatory Visit | Attending: Nephrology | Admitting: Nephrology

## 2013-12-03 DIAGNOSIS — N183 Chronic kidney disease, stage 3 unspecified: Secondary | ICD-10-CM

## 2013-12-12 ENCOUNTER — Other Ambulatory Visit (HOSPITAL_COMMUNITY): Payer: Self-pay | Admitting: Nephrology

## 2013-12-13 ENCOUNTER — Other Ambulatory Visit (HOSPITAL_COMMUNITY): Payer: Self-pay | Admitting: Nephrology

## 2013-12-13 ENCOUNTER — Other Ambulatory Visit: Payer: Self-pay | Admitting: Family Medicine

## 2013-12-13 DIAGNOSIS — R8281 Pyuria: Secondary | ICD-10-CM

## 2013-12-13 DIAGNOSIS — C9110 Chronic lymphocytic leukemia of B-cell type not having achieved remission: Secondary | ICD-10-CM

## 2013-12-16 ENCOUNTER — Telehealth: Payer: Self-pay | Admitting: Family Medicine

## 2013-12-16 NOTE — Telephone Encounter (Signed)
Called and discussed with patient

## 2013-12-16 NOTE — Telephone Encounter (Signed)
It is fine to stop the blood thinner.  I am not sure what sort of order is needed.  I tried to call the number below and only got the fax "ring."

## 2013-12-16 NOTE — Telephone Encounter (Signed)
Needs order to stop blood thinner, pt is scheduled for biopsy on 12/1, needs to stop for 5 days.

## 2013-12-20 ENCOUNTER — Other Ambulatory Visit: Payer: Self-pay | Admitting: Radiology

## 2013-12-23 ENCOUNTER — Other Ambulatory Visit: Payer: Self-pay | Admitting: Radiology

## 2013-12-24 ENCOUNTER — Encounter (HOSPITAL_COMMUNITY): Payer: Self-pay

## 2013-12-24 ENCOUNTER — Other Ambulatory Visit (HOSPITAL_COMMUNITY): Payer: Self-pay | Admitting: Nephrology

## 2013-12-24 ENCOUNTER — Ambulatory Visit (HOSPITAL_COMMUNITY)
Admission: RE | Admit: 2013-12-24 | Discharge: 2013-12-24 | Disposition: A | Discharge status: Home / Self Care | Payer: Medicare Other | Source: Ambulatory Visit | Attending: Nephrology | Admitting: Nephrology

## 2013-12-24 DIAGNOSIS — C9110 Chronic lymphocytic leukemia of B-cell type not having achieved remission: Secondary | ICD-10-CM

## 2013-12-24 DIAGNOSIS — N183 Chronic kidney disease, stage 3 (moderate): Secondary | ICD-10-CM | POA: Diagnosis not present

## 2013-12-24 DIAGNOSIS — R8281 Pyuria: Secondary | ICD-10-CM

## 2013-12-24 HISTORY — DX: Chronic lymphocytic leukemia of B-cell type not having achieved remission: C91.10

## 2013-12-24 LAB — CBC WITH DIFFERENTIAL/PLATELET
BASOS PCT: 0 % (ref 0–1)
Basophils Absolute: 0 10*3/uL (ref 0.0–0.1)
EOS PCT: 1 % (ref 0–5)
Eosinophils Absolute: 0.3 10*3/uL (ref 0.0–0.7)
HCT: 35.2 % — ABNORMAL LOW (ref 36.0–46.0)
HEMOGLOBIN: 11.2 g/dL — AB (ref 12.0–15.0)
Lymphocytes Relative: 85 % — ABNORMAL HIGH (ref 12–46)
Lymphs Abs: 28.8 10*3/uL — ABNORMAL HIGH (ref 0.7–4.0)
MCH: 30.8 pg (ref 26.0–34.0)
MCHC: 31.8 g/dL (ref 30.0–36.0)
MCV: 96.7 fL (ref 78.0–100.0)
MONO ABS: 0.7 10*3/uL (ref 0.1–1.0)
MONOS PCT: 2 % — AB (ref 3–12)
NEUTROS PCT: 12 % — AB (ref 43–77)
Neutro Abs: 4.1 10*3/uL (ref 1.7–7.7)
PLATELETS: 265 10*3/uL (ref 150–400)
RBC: 3.64 MIL/uL — AB (ref 3.87–5.11)
RDW: 16.2 % — ABNORMAL HIGH (ref 11.5–15.5)
WBC: 33.9 10*3/uL — AB (ref 4.0–10.5)

## 2013-12-24 LAB — APTT: aPTT: 27 seconds (ref 24–37)

## 2013-12-24 LAB — BASIC METABOLIC PANEL
Anion gap: 13 (ref 5–15)
BUN: 47 mg/dL — AB (ref 6–23)
CHLORIDE: 106 meq/L (ref 96–112)
CO2: 25 mEq/L (ref 19–32)
Calcium: 9.2 mg/dL (ref 8.4–10.5)
Creatinine, Ser: 2.26 mg/dL — ABNORMAL HIGH (ref 0.50–1.10)
GFR calc Af Amer: 24 mL/min — ABNORMAL LOW (ref >90)
GFR, EST NON AFRICAN AMERICAN: 21 mL/min — AB (ref >90)
Glucose, Bld: 97 mg/dL (ref 70–99)
POTASSIUM: 4.8 meq/L (ref 3.7–5.3)
SODIUM: 144 meq/L (ref 137–147)

## 2013-12-24 LAB — PROTIME-INR
INR: 1.03 (ref 0.00–1.49)
PROTHROMBIN TIME: 13.6 s (ref 11.6–15.2)

## 2013-12-24 LAB — GLUCOSE, CAPILLARY
GLUCOSE-CAPILLARY: 93 mg/dL (ref 70–99)
Glucose-Capillary: 84 mg/dL (ref 70–99)

## 2013-12-24 MED ORDER — MIDAZOLAM HCL 2 MG/2ML IJ SOLN
INTRAMUSCULAR | Status: AC | PRN
  Administered 2013-12-24 (×2): 1 mg via INTRAVENOUS

## 2013-12-24 MED ORDER — ACETAMINOPHEN 500 MG PO TABS: 500.0000 mg | ORAL_TABLET | Freq: Four times a day (QID) | ORAL | Status: DC | PRN

## 2013-12-24 MED ORDER — MIDAZOLAM HCL 2 MG/2ML IJ SOLN
INTRAMUSCULAR | Status: AC
Start: 2013-12-24 — End: 2013-12-24
  Filled 2013-12-24: qty 2

## 2013-12-24 MED ORDER — FENTANYL CITRATE 0.05 MG/ML IJ SOLN
INTRAMUSCULAR | Status: AC | PRN
  Administered 2013-12-24 (×2): 50 ug via INTRAVENOUS

## 2013-12-24 MED ORDER — LIDOCAINE HCL (PF) 1 % IJ SOLN
INTRAMUSCULAR | Status: AC
  Filled 2013-12-24: qty 10

## 2013-12-24 MED ORDER — SODIUM CHLORIDE 0.9 % IV SOLN
INTRAVENOUS | Status: DC
  Administered 2013-12-24: 09:00:00 via INTRAVENOUS

## 2013-12-24 MED ORDER — FENTANYL CITRATE 0.05 MG/ML IJ SOLN
INTRAMUSCULAR | Status: AC
  Filled 2013-12-24: qty 2

## 2013-12-24 NOTE — Discharge Instructions (Signed)
Kidney Biopsy A biopsy is a test that involves collecting small pieces of tissue, usually with a needle. The tissue is then examined under a microscope. A kidney biopsy can help a health care provider make a diagnosis and determine the best course of treatment. Your health care provider may recommend a kidney biopsy if you have any of the following conditions:  Blood in your urine (hematuria).  Excessive protein in your urine (proteinuria).  Impaired kidney function that causes excessive waste products in your blood. A specialist will look at the kidney tissue samples to check for unusual deposits, scarring, or infecting organisms that would explain your condition. If you have a kidney transplant, a biopsy can also help explain why a transplanted kidney is not working properly. Talk with your health care provider about what information might be learned from the biopsy and the risks involved. This can help you make a decision about whether a biopsy is worthwhile in your case. LET [facility] CARE PROVIDER KNOW ABOUT:  Any allergies you have.  All medicines you are taking, including vitamins, herbs, eye drops, creams, and over-the-counter medicines.  Previous problems you or members of your family have had with the use of anesthetics.  Any blood disorders you have.  Previous surgeries you have had.  Medical conditions you have. RISKS AND COMPLICATIONS Generally, a kidney biopsy is a safe procedure. However, as with any procedure, complications can occur. Possible complications include:  Infection.  Bleeding. BEFORE THE PROCEDURE  Make sure you understand the need for a biopsy.  Do not eat or drink for 8 hours before the test or as directed by your health care provider.  You will need to give blood and urine samples before the biopsy. This is to make sure you do not have a condition where you should not have a biopsy. PROCEDURE Kidney biopsies are usually done in a hospital. During  the procedure, you may be fully awake with light sedation, or you may be asleep under general anesthesia. The entire procedure usually takes an hour.  You will lie on your stomach to position the kidneys near the surface of your back. If you have a transplanted kidney, you will lie on your back.  The health care provider will inject a local painkiller. For a through-the-skin (percutaneous) biopsy, the health care provider will use a locating needle and X-ray or ultrasound equipment to find the right spot.  A collecting needle will be used to gather the tissue. If you are awake, you will be asked to hold your breath as the needle is inserted and collects the tissue. Each insertion and collection lasts about 30 seconds or a little longer. You will be told when to exhale. AFTER THE PROCEDURE  You will lie on your back for 12 to 24 hours. If you have a transplanted kidney, you may not have to lie on your back. During this time, your back will probably feel sore. You may stay in the hospital overnight after the procedure so that staff can check your condition.  You may notice some blood in your urine for 24 hours after the test. To detect any problems, your health care providers will:  Monitor your blood pressure and pulse.  Take blood samples to measure the amount of red blood cells.  Examine the urine that you pass.  On rare occasions when bleeding is excessive, it may be necessary to replace lost blood with a transfusion.  It is your responsibility to obtain your test results.  Ask the lab or department performing the test when and how you will get your results. FOR MORE INFORMATION  [employer]: https://mathis.com/  National Kid[employer]: www.kidney.org  National Kidney and Urologic Diseases Information Clearinghouse: http://kidney.AmenCredit.is Document Released: 11/21/2003 Document Revised: 10/31/2012 Document Reviewed: 07/16/2012 [facility] Patient Information 2015 [place],  [place]. This information is not intended to replace advice given to you by your health care provider. Make sure you discuss any questions you have with your health care provider.   May re-start Plavix on 12/26/13.

## 2013-12-24 NOTE — Sedation Documentation (Signed)
Patient denies pain and is resting comfortably.  

## 2013-12-24 NOTE — H&P (Signed)
Chief Complaint: "I am here for a kidney biopsy."  Referring Physician(s): Powell,Alvin C  History of Present Illness: Christine Santos is a 68 y.o. female with CLL and her oncologist noticed her renal function labs were abnormal and referred her to Dr. [name]. IR received request through Dr. [name] for image guided renal biopsy with history of CLL and now chronic kidney disease stage 3 and echogenic kidneys on [place]. She denies any chest pain, shortness of breath or palpitations. She denies any active signs of bleeding or excessive bruising. She denies any recent fever or chills. The patient denies any history of sleep apnea or chronic oxygen use currently, but has previously been on chronic oxygen. She has previously tolerated sedation without complications during a colonoscopy. She states she stopped her plavix for 5 days and last dose 11/26.    Past Medical History  Diagnosis Date  . CLL (chronic lymphoblastic leukemia)   . Hyperlipidemia   . Hypertension   . Diastolic heart failure   . CAD (coronary artery disease)     s/p CABG in 1999  . Peripheral vascular disease   . Heart murmur   . Myocardial infarction     "I had one but didn't know it" (01/22/2013)  . PAD (peripheral artery disease)   . IDDM (insulin dependent diabetes mellitus)   . Anemia 08/29/2011  . GERD (gastroesophageal reflux disease)   . H/O hiatal hernia   . Arthritis     "legs" (01/22/2013)    Past Surgical History  Procedure Laterality Date  . Cataract extraction w/ intraocular lens  implant, bilateral Bilateral ?2011  . Heel spur excision Bilateral 1970's  . Cholecystectomy  1993  . Tubal ligation  1980's  . Cardiac catheterization      "I've had 2-4" (01/22/2013)  . Coronary artery bypass graft  10/1994    "CABG X3"  . Shoulder open rotator cuff repair Bilateral 1990's    "2 times on 1 side; once on the other"  . Angioplasty / stenting iliac      [name] 11/25/2010 (01/22/2013)  . Angioplasty /  stenting femoral Right 09/2010    SFA/notes 11/25/2010 (01/22/2013)  . Angioplasty / stenting femoral Left 11/2010    SFA/notes 11/25/2010 (01/22/2013)  . Transluminal atherectomy femoral artery Right 01/22/2013    & balloon    Allergies: Amoxicillin; Cephalexin; and Codeine phosphate  Medications: Prior to Admission medications   Medication Sig Start Date End Date Taking? Authorizing Provider  allopurinol (ZYLOPRIM) 300 MG tablet TAKE 1/2 TABLET BY MOUTH ONCE DAILY 04/29/13  Yes Zigmund Gottron, MD  Ascorbic Acid (VITAMIN C) 100 MG tablet Take 100 mg by mouth daily.   Yes Historical Provider, MD  aspirin 81 MG tablet Take 81 mg by mouth daily.   Yes Historical Provider, MD  atorvastatin (LIPITOR) 80 MG tablet TAKE 1 TABLET BY MOUTH EVERY DAY 07/31/13  Yes [name], MD  Choline Fenofibrate (FENOFIBRIC ACID) 45 MG CPDR TAKE 1 CAPSULE BY MOUTH DAILY 07/31/13  Yes [name], MD  cloNIDine (CATAPRES - DOSED IN MG/24 HR) 0.2 mg/24hr patch Place 0.2 mg onto the skin once a week. Sunday   Yes Historical Provider, MD  felodipine (PLENDIL) 10 MG 24 hr tablet TAKE 2 TABLETS BY MOUTH EVERY DAY 09/18/13  Yes [name], MD  ferrous sulfate 325 (65 FE) MG tablet Take 1 tablet (325 mg total) by mouth daily with breakfast. 05/23/13  Yes Zigmund Gottron, MD  furosemide (  LASIX) 40 MG tablet Take 40 mg by mouth 2 (two) times daily with breakfast and lunch. Taking total of 80 mg daily. 09/11/13  Yes Zigmund Gottron, MD  GNP GARLIC EXTRACT PO Take 1 tablet by mouth daily.     Yes Historical Provider, MD  Insulin Glargine (LANTUS SOLOSTAR) 100 UNIT/ML SOPN Inject 35 Units into the skin daily. 11/30/12  Yes Zigmund Gottron, MD  insulin lispro (HUMALOG) 100 UNIT/ML KiwkPen Inject 5 Units into the skin 3 (three) times daily.   Yes Historical Provider, MD  lisinopril (PRINIVIL,ZESTRIL) 40 MG tablet TAKE 1 TABLET BY MOUTH EVERY DAY 04/29/13  Yes Zigmund Gottron, MD    Melatonin 5 MG CAPS Take 10 mg by mouth at bedtime.    Yes Historical Provider, MD  metoprolol tartrate (LOPRESSOR) 25 MG tablet TAKE 1/2 TABLET BY MOUTH TWICE DAILY 04/29/13  Yes Zigmund Gottron, MD  Multiple Vitamins-Minerals (MULTIVITAMINS THER. W/MINERALS) TABS Take 1 tablet by mouth daily.     Yes Historical Provider, MD  Omega-3 Fatty Acids (FISH OIL) 1000 MG CAPS Take 2,000 mg by mouth 2 (two) times daily.    Yes Historical Provider, MD  OVER THE COUNTER MEDICATION Take 1 tablet by mouth 2 (two) times daily. Macular Degenerative Eye Supplement   Yes Historical Provider, MD  pantoprazole (PROTONIX) 40 MG tablet TAKE 1 TABLET BY MOUTH EVERY DAY 04/29/13  Yes Zigmund Gottron, MD  PARoxetine (PAXIL) 20 MG tablet TAKE 1 TABLET BY MOUTH EVERY MORNING 12/13/13  Yes Zigmund Gottron, MD  traMADol (ULTRAM) 50 MG tablet TAKE 1 TABLET BY MOUTH FOUR TIMES DAILY 09/18/13  Yes Zigmund Gottron, MD  ACCU-CHEK AVIVA PLUS test strip TEST as directed three times a day 11/19/13   Zigmund Gottron, MD  clopidogrel (PLAVIX) 75 MG tablet TAKE 1 TABLET BY MOUTH DAILY    Zigmund Gottron, MD    Family History  Problem Relation Age of Onset  . Heart disease Mother   . Pneumonia Father     History   Social History  . Marital Status: Divorced    Spouse Name: N/A    Number of Children: 2  . Years of Education: 12   Occupational History  . DISABILITY Other    Worked at [employer] Topics  . Smoking status: Never Smoker   . Smokeless tobacco: Never Used  . Alcohol Use: No  . Drug Use: No  . Sexual Activity: Not Currently   Other Topics Concern  . None   Social History Narrative   Health Care POA:    Emergency Contact: sister, [name] (h) [phone]   End of Life Plan:    Who lives with you: female friend   Any pets: none   Diet: Pt has a varied diet of protein, starch, vegetables   Exercise: Pt does not have any regular exercise routine.    Seatbelts: Pt reports wearing seatbelt when in vehicles.    Sun Exposure/Protection: Pt reports using sun screen.   Hobbies: going to lake, biking, blue grass music         Review of Systems: A 12 point ROS discussed and pertinent positives are indicated in the HPI above.  All other systems are negative.  Review of Systems  Gastrointestinal: Negative for blood in stool.  Genitourinary: Negative for hematuria.   Vital Signs: BP 171/53 mmHg  Pulse 58  Temp(Src) 97.7 F (36.5 C) (Oral)  Resp 18  Ht 5\' 6"  (1.676 m)  Wt 185 lb (83.915 kg)  BMI 29.87 kg/m2  SpO2 94%  Physical Exam  Constitutional: She is oriented to person, place, and time. No distress.  HENT:  Head: Normocephalic and atraumatic.  Neck: No tracheal deviation present.  Cardiovascular: Normal rate and regular rhythm.  Exam reveals no gallop and no friction rub.   No murmur heard. Abdominal: Soft. Bowel sounds are normal. She exhibits no distension. There is no tenderness.  Neurological: She is alert and oriented to person, place, and time.  Skin: Skin is warm and dry. She is not diaphoretic.   Imaging: US Renal  12/03/2013   CLINICAL DATA:  Chronic kidney disease, stage III.  EXAM: RENAL/URINARY TRACT ULTRASOUND COMPLETE  COMPARISON:  CT 11/07/2008  FINDINGS: Right Kidney:  Length: 12.1 cm. Multiple cysts noted, the largest measuring 12 mm in the midpole, 14 mm in the upper pole, and 18 mm in the lower pole. Echogenic foci within the kidney compatible with small nonobstructing stones, 6 mm in the midpole and 4 mm in the lower pole. No hydronephrosis. Increased echotexture throughout the right kidney.  Left Kidney:  Length: 12.7 cm. Multiple cysts noted, the largest in the midpole measuring 18 mm, lower pole measuring 22 mm and upper pole measuring 11 mm. No hydronephrosis. Increased echotexture throughout the left kidney.  Bladder:  Appears normal for degree of bladder distention.  IMPRESSION: Increased echotexture  within the kidneys bilaterally. No hydronephrosis. Multiple bilateral benign appearing renal cysts.  Right nephrolithiasis.   Electronically Signed   By: Rolm Baptise M.D.   On: 12/03/2013 10:46    Labs:  CBC:  Recent Labs  04/22/13 1401 06/21/13 1249 08/16/13 1249 10/17/13 1315  WBC 46.5* 44.5* 43.0* 43.8*  HGB 10.6* 10.5* 11.0* 10.9*  HCT 34.4* 34.1* 36.1 35.6  PLT 337 297 284 334    COAGS:  Recent Labs  01/15/13 1116  INR 1.0    BMP:  Recent Labs  01/22/13 0920 01/23/13 0715 02/08/13 0854  05/22/13 1421  07/05/13 1322 08/16/13 1250 09/11/13 1408 10/17/13 1314  NA 145 142 142  < > 140  < > 141 140 141 141  K 5.0 5.1 4.7  < > 5.4*  < > 5.4* 5.3* 4.8 4.5  CL 108 105 110  --  106  --   --   --  105  --   CO2 23 22 24   < > 23  < > 24 19* 26 23  GLUCOSE 126* 114* 98  < > 129*  < > 139 124 115* 158*  BUN 48* 41* 36*  < > 52*  < > 43.0* 38.9* 56* 51.0*  CALCIUM 9.5 8.5 8.9  < > 9.3  < > 9.5 9.1 9.1 9.4  CREATININE 1.94* 2.12* 1.9*  < > 2.14*  < > 2.3* 2.0* 2.48* 2.6*  GFRNONAA 25* 23*  --   --   --   --   --   --   --   --   GFRAA 29* 26*  --   --   --   --   --   --   --   --   < > = values in this interval not displayed.  LIVER FUNCTION TESTS:  Recent Labs  02/22/13 1300 03/11/13 0844 06/21/13 1249 07/09/13 1002 08/16/13 1250  BILITOT 0.42  --  0.41 0.4 0.29  AST 20  --  20 24 26   ALT 20 18 18  21  24  ALKPHOS 90  --  87 71 95  PROT 6.1*  --  6.1* 6.2 6.1*  ALBUMIN 3.5  --  3.5 3.7 3.5   Assessment and Plan: CLL CKD stage 3/echogenic kidneys on [place], seen by Dr. [name] Scheduled today for CT guided renal biopsy with moderate sedation Patient has been NPO, last dose of plavix was 11/26, labs reviewed Risks and Benefits discussed with the patient. All of the patient's questions were answered, patient is agreeable to proceed. Consent signed and in chart.    SignedHedy Jacob 12/24/2013, 8:25 AM

## 2013-12-24 NOTE — Procedures (Signed)
US guided left renal biopsy.  Two cores obtained from lower pole.  No immediate complication.

## 2013-12-30 ENCOUNTER — Other Ambulatory Visit: Payer: Self-pay | Admitting: Oncology

## 2013-12-31 ENCOUNTER — Telehealth: Payer: Self-pay | Admitting: Hematology and Oncology

## 2013-12-31 NOTE — Telephone Encounter (Signed)
s.w. pt and advised on tomorrows appt.Marland KitchenMarland KitchenMarland KitchenMarland Kitchenpt ok and aware

## 2014-01-01 ENCOUNTER — Encounter: Payer: Self-pay | Admitting: Hematology and Oncology

## 2014-01-01 ENCOUNTER — Telehealth: Payer: Self-pay | Admitting: *Deleted

## 2014-01-01 ENCOUNTER — Other Ambulatory Visit (HOSPITAL_BASED_OUTPATIENT_CLINIC_OR_DEPARTMENT_OTHER): Payer: Medicare Other

## 2014-01-01 ENCOUNTER — Ambulatory Visit (HOSPITAL_BASED_OUTPATIENT_CLINIC_OR_DEPARTMENT_OTHER): Payer: Medicare Other | Admitting: Hematology and Oncology

## 2014-01-01 ENCOUNTER — Telehealth (HOSPITAL_COMMUNITY): Payer: Self-pay | Admitting: Radiology

## 2014-01-01 ENCOUNTER — Telehealth: Payer: Self-pay | Admitting: Hematology and Oncology

## 2014-01-01 VITALS — BP 151/62 | HR 57 | Temp 98.0°F | Resp 18 | Ht 66.0 in | Wt 185.1 lb

## 2014-01-01 DIAGNOSIS — C9110 Chronic lymphocytic leukemia of B-cell type not having achieved remission: Secondary | ICD-10-CM

## 2014-01-01 DIAGNOSIS — D630 Anemia in neoplastic disease: Secondary | ICD-10-CM

## 2014-01-01 DIAGNOSIS — N183 Chronic kidney disease, stage 3 unspecified: Secondary | ICD-10-CM

## 2014-01-01 DIAGNOSIS — I5022 Chronic systolic (congestive) heart failure: Secondary | ICD-10-CM

## 2014-01-01 DIAGNOSIS — E1140 Type 2 diabetes mellitus with diabetic neuropathy, unspecified: Secondary | ICD-10-CM

## 2014-01-01 LAB — CBC WITH DIFFERENTIAL/PLATELET
BASO%: 0.3 % (ref 0.0–2.0)
BASOS ABS: 0.1 10*3/uL (ref 0.0–0.1)
EOS%: 0.9 % (ref 0.0–7.0)
Eosinophils Absolute: 0.4 10*3/uL (ref 0.0–0.5)
HEMATOCRIT: 35.2 % (ref 34.8–46.6)
HGB: 10.7 g/dL — ABNORMAL LOW (ref 11.6–15.9)
LYMPH%: 82.1 % — AB (ref 14.0–49.7)
MCH: 29.5 pg (ref 25.1–34.0)
MCHC: 30.4 g/dL — ABNORMAL LOW (ref 31.5–36.0)
MCV: 97.3 fL (ref 79.5–101.0)
MONO#: 1.1 10*3/uL — AB (ref 0.1–0.9)
MONO%: 2.9 % (ref 0.0–14.0)
NEUT#: 5.3 10*3/uL (ref 1.5–6.5)
NEUT%: 13.8 % — AB (ref 38.4–76.8)
PLATELETS: 297 10*3/uL (ref 145–400)
RBC: 3.62 10*6/uL — ABNORMAL LOW (ref 3.70–5.45)
RDW: 16.4 % — ABNORMAL HIGH (ref 11.2–14.5)
WBC: 38.5 10*3/uL — ABNORMAL HIGH (ref 3.9–10.3)
lymph#: 31.6 10*3/uL — ABNORMAL HIGH (ref 0.9–3.3)

## 2014-01-01 LAB — BASIC METABOLIC PANEL (CC13)
Anion Gap: 10 mEq/L (ref 3–11)
BUN: 49.1 mg/dL — AB (ref 7.0–26.0)
CO2: 26 meq/L (ref 22–29)
CREATININE: 2.3 mg/dL — AB (ref 0.6–1.1)
Calcium: 9.3 mg/dL (ref 8.4–10.4)
Chloride: 108 mEq/L (ref 98–109)
EGFR: 21 mL/min/{1.73_m2} — ABNORMAL LOW (ref >90)
Glucose: 84 mg/dl (ref 70–140)
Potassium: 4.6 mEq/L (ref 3.5–5.1)
Sodium: 144 mEq/L (ref 136–145)

## 2014-01-01 LAB — TECHNOLOGIST REVIEW

## 2014-01-01 NOTE — Telephone Encounter (Signed)
Tiffany from [facility] called to request for Plavix to be stopped for 5 days for port-a-cath placement.  Please give her a call with the order at [phone].  Derl Barrow, RN

## 2014-01-01 NOTE — Assessment & Plan Note (Signed)
Kidney biopsy is pending. She is currently being followed closely with the nephrologist.

## 2014-01-01 NOTE — Telephone Encounter (Signed)
Received telephone order from Dr. Madison Hickman:  Christine Santos to hold plavix for 5 days prior to portacath removal.

## 2014-01-01 NOTE — Assessment & Plan Note (Addendum)
This is likely anemia of chronic disease. The patient denies recent history of bleeding such as epistaxis, hematuria or hematochezia. She is asymptomatic from the anemia. We will observe for now.  She does not require transfusion now.  I will check iron studies in the next visit.

## 2014-01-01 NOTE — Assessment & Plan Note (Signed)
Her kidney biopsy result is pending. However, according to her nephrologist, it is suspicious that her kidney might be involved by CLL. This would be a good reason to treat her. She need full staging PET CT scan and possible bone marrow biopsy. Given her multiple co-morbidities, I will recommend the use of Obinutuzumab along with chlorambucil. I will get her to chemotherapy education class and see her back prior to the start of treatment for further discussion.  she will need port placement due to poor venous access

## 2014-01-01 NOTE — Telephone Encounter (Signed)
Per staff message and POF I have scheduled appts. Advised scheduler of appts. JMW  

## 2014-01-01 NOTE — Telephone Encounter (Signed)
Gave avs & cal for Dec. Sent mess to sch tx. Also sent mess to social worker for pt to meet with her.

## 2014-01-01 NOTE — Assessment & Plan Note (Signed)
Her blood sugar is under good control and she will remain on current regimen , as prescribed by her primary care provider

## 2014-01-01 NOTE — Progress Notes (Signed)
[facility] progress notes  Patient Care Team: Zigmund Gottron, MD as PCP - General Johna Sheriff, MD (Ophthalmology) [name], MD as Consulting Physician (Cardiology) Hurman Horn, MD (Ophthalmology) Heath Lark, MD as Consulting Physician (Hematology and Oncology) Estanislado Emms, MD as Consulting Physician (Nephrology)  CHIEF COMPLAINTS/PURPOSE OF VISIT:   urgent evaluation for possible CLL involvement of the kidneys  HISTORY OF PRESENTING ILLNESS:  Christine Santos 68 y.o. female was transferred to my care after her prior physician has left.  I reviewed the patient's records extensive and collaborated the history with the patient. 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  In her most recent blood work, she was noted to have progressive kidney dysfunction. She was seen by nephrologist who arrange for kidney biopsy. Preliminary results suggested possible CLL involvement. Result is pending.  I was asked to see the patient urgently. She denies any flank pain or hematuria. Denies Lymphadenopathy  she denies any symptoms of anorexia, skin itching or recent  Loss. She has occasional night sweats.  She has many medical problems such as diabetic neuropathy and history of congestive heart failure. Her diabetes appeared to be under excellent control and she has no recent exacerbation of congestive heart failure.  MEDICAL HISTORY:  Past Medical History  Diagnosis Date  . CLL (chronic lymphoblastic leukemia)   . Hyperlipidemia   . Hypertension   . Diastolic heart failure   . CAD (coronary artery disease)     s/p CABG in 1999  . Peripheral vascular disease   . Heart murmur   . Myocardial infarction     "I had one but didn't know it" (01/22/2013)  . PAD (peripheral artery  disease)   . IDDM (insulin dependent diabetes mellitus)   . Anemia 08/29/2011  . GERD (gastroesophageal reflux disease)   . H/O hiatal hernia   . Arthritis     "legs" (01/22/2013)    SURGICAL HISTORY: Past Surgical History  Procedure Laterality Date  . Cataract extraction w/ intraocular lens  implant, bilateral Bilateral ?2011  . Heel spur excision Bilateral 1970's  . Cholecystectomy  1993  . Tubal ligation  1980's  . Cardiac catheterization      "I've had 2-4" (01/22/2013)  . Coronary artery bypass graft  10/1994    "CABG X3"  . Shoulder open rotator cuff repair Bilateral 1990's    "2 times on 1 side; once on the other"  . Angioplasty / stenting iliac      [name] 11/25/2010 (01/22/2013)  . Angioplasty / stenting femoral Right 09/2010    SFA/notes 11/25/2010 (01/22/2013)  . Angioplasty / stenting femoral Left 11/2010    SFA/notes 11/25/2010 (01/22/2013)  . Transluminal atherectomy femoral artery Right 01/22/2013    & balloon    SOCIAL HISTORY: History   Social History  . Marital Status: Divorced    Spouse Name: N/A    Number of Children: 2  . Years of Education: 12   Occupational History  . DISABILITY Other    Worked at [employer] Topics  . Smoking status: Never Smoker   . Smokeless tobacco: Never Used  . Alcohol Use: No  . Drug Use: No  . Sexual Activity: Not Currently   Other Topics Concern  . Not on file  Social History Narrative   Health Care POA:    Emergency Contact: sister, Christine Santos (h) [phone]   End of Life Plan:    Who lives with you: female friend   Any pets: none   Diet: Pt has a varied diet of protein, starch, vegetables   Exercise: Pt does not have any regular exercise routine.   Seatbelts: Pt reports wearing seatbelt when in vehicles.    Sun Exposure/Protection: Pt reports using sun screen.   Hobbies: going to lake, biking, blue grass music          FAMILY HISTORY: Family History  Problem Relation Age of  Onset  . Heart disease Mother   . Pneumonia Father     ALLERGIES:  is allergic to amoxicillin; cephalexin; and codeine phosphate.  MEDICATIONS:  Current Outpatient Prescriptions  Medication Sig Dispense Refill  . ACCU-CHEK AVIVA PLUS test strip TEST as directed three times a day 100 each 12  . allopurinol (ZYLOPRIM) 300 MG tablet TAKE 1/2 TABLET BY MOUTH ONCE DAILY 45 tablet 3  . Ascorbic Acid (VITAMIN C) 100 MG tablet Take 100 mg by mouth daily.    Marland Kitchen aspirin 81 MG tablet Take 81 mg by mouth daily.    Marland Kitchen atorvastatin (LIPITOR) 80 MG tablet TAKE 1 TABLET BY MOUTH EVERY DAY 90 tablet 2  . Choline Fenofibrate (FENOFIBRIC ACID) 45 MG CPDR TAKE 1 CAPSULE BY MOUTH DAILY 90 capsule 2  . cloNIDine (CATAPRES - DOSED IN MG/24 HR) 0.2 mg/24hr patch Place 0.2 mg onto the skin once a week. Sunday    . clopidogrel (PLAVIX) 75 MG tablet TAKE 1 TABLET BY MOUTH DAILY 90 tablet 3  . felodipine (PLENDIL) 10 MG 24 hr tablet TAKE 2 TABLETS BY MOUTH EVERY DAY 180 tablet 1  . ferrous sulfate 325 (65 FE) MG tablet Take 1 tablet (325 mg total) by mouth daily with breakfast.  3  . furosemide (LASIX) 40 MG tablet Take 40 mg by mouth 2 (two) times daily with breakfast and lunch. Taking total of 80 mg daily.    Marland Kitchen GNP GARLIC EXTRACT PO Take 1 tablet by mouth daily.      . Insulin Glargine (LANTUS SOLOSTAR) 100 UNIT/ML SOPN Inject 35 Units into the skin daily. 5 pen 12  . insulin lispro (HUMALOG) 100 UNIT/ML KiwkPen Inject 5 Units into the skin 3 (three) times daily.    Marland Kitchen lisinopril (PRINIVIL,ZESTRIL) 40 MG tablet TAKE 1 TABLET BY MOUTH EVERY DAY 90 tablet 3  . Melatonin 5 MG CAPS Take 10 mg by mouth at bedtime.     . metoprolol tartrate (LOPRESSOR) 25 MG tablet TAKE 1/2 TABLET BY MOUTH TWICE DAILY 90 tablet 3  . Multiple Vitamins-Minerals (MULTIVITAMINS THER. W/MINERALS) TABS Take 1 tablet by mouth daily.      . Omega-3 Fatty Acids (FISH OIL) 1000 MG CAPS Take 2,000 mg by mouth 2 (two) times daily.     Marland Kitchen OVER THE  COUNTER MEDICATION Take 1 tablet by mouth 2 (two) times daily. Macular Degenerative Eye Supplement    . pantoprazole (PROTONIX) 40 MG tablet TAKE 1 TABLET BY MOUTH EVERY DAY 90 tablet 3  . PARoxetine (PAXIL) 20 MG tablet TAKE 1 TABLET BY MOUTH EVERY MORNING 90 tablet 3  . traMADol (ULTRAM) 50 MG tablet TAKE 1 TABLET BY MOUTH FOUR TIMES DAILY 360 tablet 1   No current facility-administered medications for this visit.    REVIEW OF SYSTEMS:   Constitutional: Denies fevers, chills  Eyes: Denies blurriness  of vision, double vision or watery eyes Ears, nose, mouth, throat, and face: Denies mucositis or sore throat Respiratory: Denies cough, dyspnea or wheezes Cardiovascular: Denies palpitation, chest discomfort or lower extremity swelling Gastrointestinal:  Denies nausea, heartburn or change in bowel habits Skin: Denies abnormal skin rashes Lymphatics: Denies new lymphadenopathy or easy bruising Neurological:Denies numbness, tingling or new weaknesses Behavioral/Psych: Mood is stable, no new changes  All other systems were reviewed with the patient and are negative.  PHYSICAL EXAMINATION: ECOG PERFORMANCE STATUS: 1 - Symptomatic but completely ambulatory  Filed Vitals:   01/01/14 1121  BP: 151/62  Pulse: 57  Temp: 98 F (36.7 C)  Resp: 18   Filed Weights   01/01/14 1121  Weight: 185 lb 1.6 oz (83.961 kg)    GENERAL:alert, no distress and comfortable. She is obese SKIN: skin color, texture, turgor are normal, no rashes or significant lesions EYES: normal, conjunctiva are pink and non-injected, sclera clear OROPHARYNX:no exudate, normal lips, buccal mucosa, and tongue  NECK: supple, thyroid normal size, non-tender, without nodularity LYMPH:  no palpable lymphadenopathy in the cervical, axillary or inguinal LUNGS: clear to auscultation and percussion with normal breathing effort HEART: regular rate & rhythm and no murmurs without lower extremity edema. Well-healed surgical  scar ABDOMEN:abdomen soft, non-tender and normal bowel sounds Musculoskeletal:no cyanosis of digits and no clubbing  PSYCH: alert & oriented x 3 with fluent speech NEURO: no focal motor/sensory deficits  LABORATORY DATA:  I have reviewed the data as listed Lab Results  Component Value Date   WBC 38.5* 01/01/2014   HGB 10.7* 01/01/2014   HCT 35.2 01/01/2014   MCV 97.3 01/01/2014   PLT 297 01/01/2014    Recent Labs  01/22/13 0920 01/23/13 0715  05/22/13 1421 06/21/13 1249  07/09/13 1002 08/16/13 1250 09/11/13 1408 10/17/13 1314 12/24/13 0828 01/01/14 1117  NA 145 142  < > 140 143  < >  --  140 141 141 144 144  K 5.0 5.1  < > 5.4* 5.5*  < >  --  5.3* 4.8 4.5 4.8 4.6  CL 108 105  < > 106  --   --   --   --  105  --  106  --   CO2 23 22  < > 23 19*  < >  --  19* 26 23 25 26   GLUCOSE 126* 114*  < > 129* 110  < >  --  124 115* 158* 97 84  BUN 48* 41*  < > 52* 51.3*  < >  --  38.9* 56* 51.0* 47* 49.1*  CREATININE 1.94* 2.12*  < > 2.14* 2.2*  < >  --  2.0* 2.48* 2.6* 2.26* 2.3*  CALCIUM 9.5 8.5  < > 9.3 9.2  < >  --  9.1 9.1 9.4 9.2 9.3  GFRNONAA 25* 23*  --   --   --   --   --   --   --   --  21*  --   GFRAA 29* 26*  --   --   --   --   --   --   --   --  24*  --   PROT  --   --   < >  --  6.1*  --  6.2 6.1*  --   --   --   --   ALBUMIN  --   --   < >  --  3.5  --  3.7 3.5  --   --   --   --  AST  --   --   < >  --  20  --  24 26  --   --   --   --   ALT  --   --   < >  --  18  --  21 24  --   --   --   --   ALKPHOS  --   --   < >  --  87  --  71 95  --   --   --   --   BILITOT  --   --   < >  --  0.41  --  0.4 0.29  --   --   --   --   BILIDIR  --   --   --   --   --   --  0.0  --   --   --   --   --   < > = values in this interval not displayed.  RADIOGRAPHIC STUDIES: I have personally reviewed the radiological images as listed and agreed with the findings in the report. US Renal  12/03/2013   CLINICAL DATA:  Chronic kidney disease, stage III.  EXAM: RENAL/URINARY TRACT  ULTRASOUND COMPLETE  COMPARISON:  CT 11/07/2008  FINDINGS: Right Kidney:  Length: 12.1 cm. Multiple cysts noted, the largest measuring 12 mm in the midpole, 14 mm in the upper pole, and 18 mm in the lower pole. Echogenic foci within the kidney compatible with small nonobstructing stones, 6 mm in the midpole and 4 mm in the lower pole. No hydronephrosis. Increased echotexture throughout the right kidney.  Left Kidney:  Length: 12.7 cm. Multiple cysts noted, the largest in the midpole measuring 18 mm, lower pole measuring 22 mm and upper pole measuring 11 mm. No hydronephrosis. Increased echotexture throughout the left kidney.  Bladder:  Appears normal for degree of bladder distention.  IMPRESSION: Increased echotexture within the kidneys bilaterally. No hydronephrosis. Multiple bilateral benign appearing renal cysts.  Right nephrolithiasis.   Electronically Signed   By: Rolm Baptise M.D.   On: 12/03/2013 10:46   US Biopsy  12/24/2013   CLINICAL DATA:  68 year old with history of CLL. Stage 3 chronic kidney disease.  EXAM: ULTRASOUND-GUIDED LEFT KIDNEY BIOPSY  Physician: Stephan Minister. Anselm Pancoast, MD  FLUOROSCOPY TIME:  None  MEDICATIONS: 2 mg Versed, 100 mcg fentanyl. A radiology nurse monitored the patient for moderate sedation.  ANESTHESIA/SEDATION: Moderate sedation time: 12 min  PROCEDURE: The procedure was explained to the patient. The risks and benefits of the procedure were discussed and the patient's questions were addressed. Informed consent was obtained from the patient. The patient was placed prone. The kidneys were evaluated with ultrasound. The left kidney was selected for biopsy. The left flank was prepped and draped in a sterile fashion. 1% lidocaine was used for local anesthetic. A 16 gauge core needle was directed into the left kidney lower pole with ultrasound guidance. A total of 2 core biopsies were obtained. Specimens were placed in saline. Bandage was placed over the puncture site.  FINDINGS: Both kidneys  are echogenic with cortical thinning. Evidence for bilateral renal cysts. Biopsies were obtained from the left kidney lower pole. No evidence for hematoma or bleeding following the core biopsies.  COMPLICATIONS: No immediate complication.  IMPRESSION: Ultrasound-guided core biopsies of the left kidney lower pole.   Electronically Signed   By: Markus Daft M.D.   On: 12/24/2013 12:12    ASSESSMENT & PLAN:  Chronic lymphocytic leukemia Her kidney biopsy result is pending. However, according to her nephrologist, it is suspicious that her kidney might be involved by CLL. This would be a good reason to treat her. She need full staging PET CT scan and possible bone marrow biopsy. Given her multiple co-morbidities, I will recommend the use of Obinutuzumab along with chlorambucil. I will get her to chemotherapy education class and see her back prior to the start of treatment for further discussion.  she will need port placement due to poor venous access  Anemia in neoplastic disease This is likely anemia of chronic disease. The patient denies recent history of bleeding such as epistaxis, hematuria or hematochezia. She is asymptomatic from the anemia. We will observe for now.  She does not require transfusion now.  I will check iron studies in the next visit.   Chronic kidney disease, stage III (moderate)  Kidney biopsy is pending. She is currently being followed closely with the nephrologist.  Chronic systolic heart failure  She is currently taking multiple medications. Clinically, she has no signs of congestive heart failure.  Diabetic neuropathy  Her blood sugar is under good control and she will remain on current regimen , as prescribed by her primary care provider   Orders Placed This Encounter  Procedures  . NM PET Image Restage (PS) Whole Body    Standing Status: Future     Number of Occurrences:      Standing Expiration Date: 03/05/2015    Order Specific Question:  Reason for Exam (SYMPTOM   OR DIAGNOSIS REQUIRED)    Answer:  staging lymphoma/CLL    Order Specific Question:  Preferred imaging location?    Answer:  [facility]  . IR Fluoro Guide CV Line Right    Indicate type of CVC ordering    Standing Status: Future     Number of Occurrences:      Standing Expiration Date: 03/05/2015    Order Specific Question:  Reason for exam:    Answer:  need port for chemo    Order Specific Question:  Preferred Imaging Location?    Answer:  [facility]  . Hepatitis B surface antibody    Standing Status: Future     Number of Occurrences:      Standing Expiration Date: 03/05/2015  . Hepatitis B core antibody, IgM    Standing Status: Future     Number of Occurrences:      Standing Expiration Date: 03/05/2015  . Hepatitis B surface antigen    Standing Status: Future     Number of Occurrences:      Standing Expiration Date: 03/05/2015  . Lactate dehydrogenase    Standing Status: Future     Number of Occurrences:      Standing Expiration Date: 03/05/2015  . Comprehensive metabolic panel    Standing Status: Future     Number of Occurrences:      Standing Expiration Date: 03/05/2015  . Uric Acid    Standing Status: Future     Number of Occurrences:      Standing Expiration Date: 03/05/2015  . CBC with Differential    Standing Status: Future     Number of Occurrences:      Standing Expiration Date: 03/05/2015  . FISH, Peripheral Blood    CLL    Standing Status: Future     Number of Occurrences:      Standing Expiration Date: 03/05/2015  . Flow Cytometry    CLL  Standing Status: Future     Number of Occurrences:      Standing Expiration Date: 03/05/2015  . Ferritin    Standing Status: Future     Number of Occurrences:      Standing Expiration Date: 03/05/2015  . Iron and TIBC    Standing Status: Future     Number of Occurrences:      Standing Expiration Date: 03/05/2015    All questions were answered. The patient knows to call the clinic with any problems, questions  or concerns. I spent 40 minutes counseling the patient face to face. The total time spent in the appointment was 60 minutes and more than 50% was on counseling.     [facility], Levonia Wolfley, MD 01/01/2014 10:24 PM

## 2014-01-01 NOTE — Telephone Encounter (Signed)
Called and verbal order given to hold plavix.

## 2014-01-01 NOTE — Assessment & Plan Note (Signed)
She is currently taking multiple medications. Clinically, she has no signs of congestive heart failure.

## 2014-01-02 ENCOUNTER — Encounter (HOSPITAL_COMMUNITY): Payer: Self-pay | Admitting: Interventional Cardiology

## 2014-01-03 ENCOUNTER — Other Ambulatory Visit: Payer: Self-pay | Admitting: Hematology and Oncology

## 2014-01-03 DIAGNOSIS — C9110 Chronic lymphocytic leukemia of B-cell type not having achieved remission: Secondary | ICD-10-CM

## 2014-01-03 MED ORDER — CHLORAMBUCIL 2 MG PO TABS: ORAL_TABLET | ORAL | Status: DC

## 2014-01-07 ENCOUNTER — Telehealth: Payer: Self-pay | Admitting: Family Medicine

## 2014-01-07 ENCOUNTER — Encounter (HOSPITAL_COMMUNITY): Payer: Self-pay

## 2014-01-07 DIAGNOSIS — E1340 Other specified diabetes mellitus with diabetic neuropathy, unspecified: Secondary | ICD-10-CM

## 2014-01-07 NOTE — Telephone Encounter (Signed)
Pt needs a new prescription for her bi-lateral diabetic shoes called in to a new store called [facility]. Their office number is [phone] and their fax number is [phone]. [email or web address]

## 2014-01-08 ENCOUNTER — Other Ambulatory Visit (INDEPENDENT_AMBULATORY_CARE_PROVIDER_SITE_OTHER): Payer: Medicare Other | Admitting: *Deleted

## 2014-01-08 ENCOUNTER — Encounter: Payer: Self-pay | Admitting: *Deleted

## 2014-01-08 ENCOUNTER — Other Ambulatory Visit: Payer: Medicare Other

## 2014-01-08 DIAGNOSIS — E7800 Pure hypercholesterolemia, unspecified: Secondary | ICD-10-CM

## 2014-01-08 LAB — LIPID PANEL
Cholesterol: 154 mg/dL (ref 0–200)
HDL: 38.5 mg/dL — AB (ref >39.00)
LDL CALC: 90 mg/dL (ref 0–99)
NONHDL: 115.5
Total CHOL/HDL Ratio: 4
Triglycerides: 126 mg/dL (ref 0.0–149.0)
VLDL: 25.2 mg/dL (ref 0.0–40.0)

## 2014-01-08 LAB — HEPATIC FUNCTION PANEL
ALBUMIN: 3.5 g/dL (ref 3.5–5.2)
ALT: 17 U/L (ref 0–35)
AST: 19 U/L (ref 0–37)
Alkaline Phosphatase: 68 U/L (ref 39–117)
Bilirubin, Direct: 0.1 mg/dL (ref 0.0–0.3)
Total Bilirubin: 0.3 mg/dL (ref 0.2–1.2)
Total Protein: 6.3 g/dL (ref 6.0–8.3)

## 2014-01-08 NOTE — Telephone Encounter (Signed)
Faxed out this morning

## 2014-01-08 NOTE — Assessment & Plan Note (Signed)
Will order shoes as requested due to diabetic neuropathy and peripheral vascular disease.

## 2014-01-09 ENCOUNTER — Other Ambulatory Visit: Payer: Self-pay | Admitting: Radiology

## 2014-01-09 ENCOUNTER — Other Ambulatory Visit: Payer: Self-pay

## 2014-01-09 ENCOUNTER — Encounter: Payer: Self-pay | Admitting: *Deleted

## 2014-01-09 DIAGNOSIS — I10 Essential (primary) hypertension: Secondary | ICD-10-CM

## 2014-01-09 NOTE — Progress Notes (Signed)
[place] Social Work  Clinical Social Work was referred by patient and patient's sister to review and complete healthcare advance directives.  Clinical Social Worker met with patient, significant other, and patient's sister in [place] office.  The patient designated significant other [name] as their primary healthcare agent and sister [name] as their secondary agent.  Patient also completed healthcare living will.  Patient does not desire to have life prolonging measures in the situations indicated in the healthcare living will.  Clinical Social Worker notarized documents and made copies for patient/family. Clinical Social Worker will send documents to medical records to be scanned into patient's chart. Clinical Social Worker encouraged patient/family to contact with any additional questions or concerns.  Polo Riley, MSW, [place] [facility] [phone]

## 2014-01-10 ENCOUNTER — Ambulatory Visit (HOSPITAL_COMMUNITY)
Admission: RE | Admit: 2014-01-10 | Discharge: 2014-01-10 | Disposition: A | Discharge status: Home / Self Care | Payer: Medicare Other | Source: Ambulatory Visit | Attending: Hematology and Oncology | Admitting: Hematology and Oncology

## 2014-01-10 ENCOUNTER — Other Ambulatory Visit: Payer: Self-pay | Admitting: Hematology and Oncology

## 2014-01-10 ENCOUNTER — Encounter (HOSPITAL_COMMUNITY): Payer: Self-pay

## 2014-01-10 ENCOUNTER — Telehealth: Payer: Self-pay | Admitting: *Deleted

## 2014-01-10 DIAGNOSIS — C9110 Chronic lymphocytic leukemia of B-cell type not having achieved remission: Secondary | ICD-10-CM | POA: Insufficient documentation

## 2014-01-10 LAB — CBC WITH DIFFERENTIAL/PLATELET
BASOS ABS: 0 10*3/uL (ref 0.0–0.1)
Basophils Relative: 0 % (ref 0–1)
Eosinophils Absolute: 0.4 10*3/uL (ref 0.0–0.7)
Eosinophils Relative: 1 % (ref 0–5)
HCT: 35.3 % — ABNORMAL LOW (ref 36.0–46.0)
Hemoglobin: 11 g/dL — ABNORMAL LOW (ref 12.0–15.0)
Lymphocytes Relative: 81 % — ABNORMAL HIGH (ref 12–46)
Lymphs Abs: 33 10*3/uL — ABNORMAL HIGH (ref 0.7–4.0)
MCH: 30.6 pg (ref 26.0–34.0)
MCHC: 31.2 g/dL (ref 30.0–36.0)
MCV: 98.3 fL (ref 78.0–100.0)
MONOS PCT: 2 % — AB (ref 3–12)
Monocytes Absolute: 0.8 10*3/uL (ref 0.1–1.0)
Neutro Abs: 6.5 10*3/uL (ref 1.7–7.7)
Neutrophils Relative %: 16 % — ABNORMAL LOW (ref 43–77)
PLATELETS: 295 10*3/uL (ref 150–400)
RBC: 3.59 MIL/uL — AB (ref 3.87–5.11)
RDW: 16.3 % — AB (ref 11.5–15.5)
WBC: 40.7 10*3/uL — ABNORMAL HIGH (ref 4.0–10.5)

## 2014-01-10 LAB — GLUCOSE, CAPILLARY
GLUCOSE-CAPILLARY: 123 mg/dL — AB (ref 70–99)
GLUCOSE-CAPILLARY: 147 mg/dL — AB (ref 70–99)
Glucose-Capillary: 128 mg/dL — ABNORMAL HIGH (ref 70–99)

## 2014-01-10 LAB — PROTIME-INR
INR: 0.95 (ref 0.00–1.49)
Prothrombin Time: 12.8 seconds (ref 11.6–15.2)

## 2014-01-10 LAB — PATHOLOGIST SMEAR REVIEW

## 2014-01-10 MED ORDER — FENTANYL CITRATE 0.05 MG/ML IJ SOLN
INTRAMUSCULAR | Status: AC | PRN
  Administered 2014-01-10 (×4): 25 ug via INTRAVENOUS

## 2014-01-10 MED ORDER — FENTANYL CITRATE 0.05 MG/ML IJ SOLN
INTRAMUSCULAR | Status: AC
Start: 2014-01-10 — End: 2014-01-10
  Filled 2014-01-10: qty 4

## 2014-01-10 MED ORDER — SODIUM CHLORIDE 0.9 % IV SOLN
INTRAVENOUS | Status: DC
  Administered 2014-01-10: 10:00:00 via INTRAVENOUS

## 2014-01-10 MED ORDER — VANCOMYCIN HCL IN DEXTROSE 1-5 GM/200ML-% IV SOLN
1000.0000 mg | Freq: Once | INTRAVENOUS | Status: AC
  Administered 2014-01-10: 1000 mg via INTRAVENOUS
  Filled 2014-01-10: qty 200

## 2014-01-10 MED ORDER — LIDOCAINE HCL 1 % IJ SOLN
INTRAMUSCULAR | Status: AC
  Filled 2014-01-10: qty 20

## 2014-01-10 MED ORDER — MIDAZOLAM HCL 2 MG/2ML IJ SOLN
INTRAMUSCULAR | Status: AC
  Filled 2014-01-10: qty 6

## 2014-01-10 MED ORDER — MIDAZOLAM HCL 2 MG/2ML IJ SOLN
INTRAMUSCULAR | Status: AC | PRN
  Administered 2014-01-10: 1 mg via INTRAVENOUS
  Administered 2014-01-10 (×3): 0.5 mg via INTRAVENOUS

## 2014-01-10 MED ORDER — FLUDEOXYGLUCOSE F - 18 (FDG) INJECTION
10.2000 | Freq: Once | INTRAVENOUS | Status: AC | PRN
  Administered 2014-01-10: 10.2 via INTRAVENOUS

## 2014-01-10 MED ORDER — LIDOCAINE-PRILOCAINE 2.5-2.5 % EX CREA: 1.0000 "application " | TOPICAL_CREAM | CUTANEOUS | Status: DC | PRN

## 2014-01-10 MED ORDER — HEPARIN SOD (PORK) LOCK FLUSH 100 UNIT/ML IV SOLN
INTRAVENOUS | Status: AC | PRN
  Administered 2014-01-10: 500 [IU]

## 2014-01-10 MED ORDER — HEPARIN SOD (PORK) LOCK FLUSH 100 UNIT/ML IV SOLN
INTRAVENOUS | Status: AC
  Filled 2014-01-10: qty 5

## 2014-01-10 MED ORDER — LIDOCAINE-EPINEPHRINE 2 %-1:100000 IJ SOLN
INTRAMUSCULAR | Status: AC
  Filled 2014-01-10: qty 1

## 2014-01-10 NOTE — Procedures (Signed)
Interventional Radiology Procedure Note  Procedure: Placement of a right IJ approach single lumen PowerPort.  Tip is positioned at the superior cavoatrial junction and catheter is ready for immediate use.  Complications: No immediate Recommendations:  - Ok to shower tomorrow - Do not submerge for 7 days - Routine line care   Signed,  Jael Waldorf S. Arrow Tomko, DO    

## 2014-01-10 NOTE — Progress Notes (Addendum)
Post portacath placement instructions reviewed with patient and family. Dressing CDI. She is to have PET scan in radiology at 1400 today. Spoke to [facility] in radiology and left normal saline lock in (right forearm) per request to be used for PET scan. RN transported patient to PET scan at 1400 along with her family.

## 2014-01-10 NOTE — Telephone Encounter (Signed)
Instructed pt to come in at 8 am for lab on Tuesday 12/22.   Also informed her of [place] ready to pick up at [facility] out patient pharmacy.  She can pick it up when she is here on Tuesday.  Also sent Rx for EMLA cream.  Pt verbalized understanding.

## 2014-01-10 NOTE — Discharge Instructions (Signed)

## 2014-01-10 NOTE — H&P (Signed)
Chief Complaint: "I'm here for a port a cath"  Referring Physician(s): Gorsuch,Ni  History of Present Illness: Christine Santos is a 68 y.o. female with history of CLL who presents today for port a cath placement for chemotherapy.  Past Medical History  Diagnosis Date  . CLL (chronic lymphoblastic leukemia)   . Hyperlipidemia   . Hypertension   . Diastolic heart failure   . CAD (coronary artery disease)     s/p CABG in 1999  . Peripheral vascular disease   . Heart murmur   . Myocardial infarction     "I had one but didn't know it" (01/22/2013)  . PAD (peripheral artery disease)   . IDDM (insulin dependent diabetes mellitus)   . Anemia 08/29/2011  . GERD (gastroesophageal reflux disease)   . H/O hiatal hernia   . Arthritis     "legs" (01/22/2013)    Past Surgical History  Procedure Laterality Date  . Cataract extraction w/ intraocular lens  implant, bilateral Bilateral ?2011  . Heel spur excision Bilateral 1970's  . Cholecystectomy  1993  . Tubal ligation  1980's  . Cardiac catheterization      "I've had 2-4" (01/22/2013)  . Coronary artery bypass graft  10/1994    "CABG X3"  . Shoulder open rotator cuff repair Bilateral 1990's    "2 times on 1 side; once on the other"  . Angioplasty / stenting iliac      [name] 11/25/2010 (01/22/2013)  . Angioplasty / stenting femoral Right 09/2010    SFA/notes 11/25/2010 (01/22/2013)  . Angioplasty / stenting femoral Left 11/2010    SFA/notes 11/25/2010 (01/22/2013)  . Transluminal atherectomy femoral artery Right 01/22/2013    & balloon  . Lower extremity angiogram N/A 01/22/2013    Procedure: LOWER EXTREMITY ANGIOGRAM;  Surgeon: [name], MD;  [facility];  Service: Cardiovascular;  Laterality: N/A;    Allergies: Amoxicillin; Cephalexin; and Codeine phosphate  Medications: Prior to Admission medications   Medication Sig Start Date End Date Taking? Authorizing Provider  ACCU-CHEK AVIVA PLUS test strip  TEST as directed three times a day 11/19/13  Yes Zigmund Gottron, MD  allopurinol (ZYLOPRIM) 300 MG tablet TAKE 1/2 TABLET BY MOUTH ONCE DAILY 04/29/13  Yes Zigmund Gottron, MD  Ascorbic Acid (VITAMIN C) 100 MG tablet Take 100 mg by mouth daily.   Yes Historical Provider, MD  aspirin 81 MG tablet Take 81 mg by mouth daily.   Yes Historical Provider, MD  atorvastatin (LIPITOR) 80 MG tablet TAKE 1 TABLET BY MOUTH EVERY DAY 07/31/13  Yes [name], MD  chlorambucil (LEUKERAN) 2 MG tablet Take 4 tabs on days 1 and 15 of chemotherapy 01/03/14  Yes Heath Lark, MD  Choline Fenofibrate (FENOFIBRIC ACID) 45 MG CPDR TAKE 1 CAPSULE BY MOUTH DAILY 07/31/13  Yes [name], MD  cloNIDine (CATAPRES - DOSED IN MG/24 HR) 0.2 mg/24hr patch Place 0.2 mg onto the skin once a week. Sunday   Yes Historical Provider, MD  clopidogrel (PLAVIX) 75 MG tablet TAKE 1 TABLET BY MOUTH DAILY   Yes Zigmund Gottron, MD  felodipine (PLENDIL) 10 MG 24 hr tablet TAKE 2 TABLETS BY MOUTH EVERY DAY 09/18/13  Yes [name], MD  ferrous sulfate 325 (65 FE) MG tablet Take 1 tablet (325 mg total) by mouth daily with breakfast. 05/23/13  Yes Zigmund Gottron, MD  furosemide (LASIX) 40 MG tablet Take 40 mg by mouth 2 (two) times  daily with breakfast and lunch. Taking total of 80 mg daily. 09/11/13  Yes Zigmund Gottron, MD  GNP GARLIC EXTRACT PO Take 1 tablet by mouth daily.     Yes Historical Provider, MD  Insulin Glargine (LANTUS SOLOSTAR) 100 UNIT/ML SOPN Inject 35 Units into the skin daily. 11/30/12  Yes Zigmund Gottron, MD  insulin lispro (HUMALOG) 100 UNIT/ML KiwkPen Inject 5 Units into the skin 3 (three) times daily after meals.    Yes Historical Provider, MD  lisinopril (PRINIVIL,ZESTRIL) 40 MG tablet TAKE 1 TABLET BY MOUTH EVERY DAY 04/29/13  Yes Zigmund Gottron, MD  Melatonin 5 MG CAPS Take 10 mg by mouth at bedtime.    Yes Historical Provider, MD  metoprolol tartrate  (LOPRESSOR) 25 MG tablet TAKE 1/2 TABLET BY MOUTH TWICE DAILY 04/29/13  Yes Zigmund Gottron, MD  Multiple Vitamins-Minerals (MULTIVITAMINS THER. W/MINERALS) TABS Take 1 tablet by mouth daily.     Yes Historical Provider, MD  Omega-3 Fatty Acids (FISH OIL) 1000 MG CAPS Take 2,000 mg by mouth 2 (two) times daily.    Yes Historical Provider, MD  OVER THE COUNTER MEDICATION Take 1 tablet by mouth 2 (two) times daily. Macular Degenerative Eye Supplement   Yes Historical Provider, MD  pantoprazole (PROTONIX) 40 MG tablet TAKE 1 TABLET BY MOUTH EVERY DAY 04/29/13  Yes Zigmund Gottron, MD  PARoxetine (PAXIL) 20 MG tablet TAKE 1 TABLET BY MOUTH EVERY MORNING 12/13/13  Yes Zigmund Gottron, MD  traMADol (ULTRAM) 50 MG tablet TAKE 1 TABLET BY MOUTH FOUR TIMES DAILY 09/18/13  Yes Zigmund Gottron, MD    Family History  Problem Relation Age of Onset  . Heart disease Mother   . Pneumonia Father     History   Social History  . Marital Status: Divorced    Spouse Name: N/A    Number of Children: 2  . Years of Education: 12   Occupational History  . DISABILITY Other    Worked at [employer] Topics  . Smoking status: Never Smoker   . Smokeless tobacco: Never Used  . Alcohol Use: No  . Drug Use: No  . Sexual Activity: Not Currently   Other Topics Concern  . None   Social History Narrative   Health Care POA:    Emergency Contact: sister, Christine Santos (h) [phone]   End of Life Plan:    Who lives with you: female friend   Any pets: none   Diet: Pt has a varied diet of protein, starch, vegetables   Exercise: Pt does not have any regular exercise routine.   Seatbelts: Pt reports wearing seatbelt when in vehicles.    Sun Exposure/Protection: Pt reports using sun screen.   Hobbies: going to lake, biking, blue grass music               Review of Systems  Constitutional: Negative for fever and chills.  Respiratory: Negative for cough and  shortness of breath.   Cardiovascular: Negative for chest pain.  Gastrointestinal: Negative for nausea, vomiting, abdominal pain and blood in stool.  Genitourinary: Negative for hematuria.  Musculoskeletal: Negative for back pain.  Neurological: Negative for headaches.    Vital Signs: BP 129/53  HR 61  R 16  02 SATS 90% RA  TEMP 98.2  Physical Exam  Constitutional: She is oriented to person, place, and time. She appears well-developed and well-nourished.  Cardiovascular: Normal rate and regular rhythm.   Pulmonary/Chest:  Effort normal.  Sl dim BS bases  Abdominal: Soft. Bowel sounds are normal. There is no tenderness.  Musculoskeletal: Normal range of motion. She exhibits edema.  Neurological: She is alert and oriented to person, place, and time.    Imaging: US Biopsy  12/24/2013   CLINICAL DATA:  68 year old with history of CLL. Stage 3 chronic kidney disease.  EXAM: ULTRASOUND-GUIDED LEFT KIDNEY BIOPSY  Physician: Stephan Minister. Anselm Pancoast, MD  FLUOROSCOPY TIME:  None  MEDICATIONS: 2 mg Versed, 100 mcg fentanyl. A radiology nurse monitored the patient for moderate sedation.  ANESTHESIA/SEDATION: Moderate sedation time: 12 min  PROCEDURE: The procedure was explained to the patient. The risks and benefits of the procedure were discussed and the patient's questions were addressed. Informed consent was obtained from the patient. The patient was placed prone. The kidneys were evaluated with ultrasound. The left kidney was selected for biopsy. The left flank was prepped and draped in a sterile fashion. 1% lidocaine was used for local anesthetic. A 16 gauge core needle was directed into the left kidney lower pole with ultrasound guidance. A total of 2 core biopsies were obtained. Specimens were placed in saline. Bandage was placed over the puncture site.  FINDINGS: Both kidneys are echogenic with cortical thinning. Evidence for bilateral renal cysts. Biopsies were obtained from the left kidney lower pole. No  evidence for hematoma or bleeding following the core biopsies.  COMPLICATIONS: No immediate complication.  IMPRESSION: Ultrasound-guided core biopsies of the left kidney lower pole.   Electronically Signed   By: Markus Daft M.D.   On: 12/24/2013 12:12    Labs:  CBC:  Recent Labs  10/17/13 1315 12/24/13 0828 01/01/14 1117 01/10/14 1025  WBC 43.8* 33.9* 38.5* 40.7*  HGB 10.9* 11.2* 10.7* 11.0*  HCT 35.6 35.2* 35.2 35.3*  PLT 334 265 297 295    COAGS:  Recent Labs  01/15/13 1116 12/24/13 0828 01/10/14 1025  INR 1.0 1.03 0.95  APTT  --  27  --     BMP:  Recent Labs  01/22/13 0920 01/23/13 0715 02/08/13 0854  05/22/13 1421  09/11/13 1408 10/17/13 1314 12/24/13 0828 01/01/14 1117  NA 145 142 142  < > 140  < > 141 141 144 144  K 5.0 5.1 4.7  < > 5.4*  < > 4.8 4.5 4.8 4.6  CL 108 105 110  --  106  --  105  --  106  --   CO2 23 22 24   < > 23  < > 26 23 25 26   GLUCOSE 126* 114* 98  < > 129*  < > 115* 158* 97 84  BUN 48* 41* 36*  < > 52*  < > 56* 51.0* 47* 49.1*  CALCIUM 9.5 8.5 8.9  < > 9.3  < > 9.1 9.4 9.2 9.3  CREATININE 1.94* 2.12* 1.9*  < > 2.14*  < > 2.48* 2.6* 2.26* 2.3*  GFRNONAA 25* 23*  --   --   --   --   --   --  21*  --   GFRAA 29* 26*  --   --   --   --   --   --  24*  --   < > = values in this interval not displayed.  LIVER FUNCTION TESTS:  Recent Labs  06/21/13 1249 07/09/13 1002 08/16/13 1250 01/08/14 0807  BILITOT 0.41 0.4 0.29 0.3  AST 20 24 26 19   ALT 18 21 24 17   ALKPHOS 87 71  95 68  PROT 6.1* 6.2 6.1* 6.3  ALBUMIN 3.5 3.7 3.5 3.5    TUMOR MARKERS: No results for input(s): AFPTM, CEA, CA199, CHROMGRNA in the last 8760 hours.  Assessment and Plan: Christine Santos is a 68 y.o. female with history of CLL who presents today for port a cath placement for chemotherapy. Details/risks of procedure d/w pt/family with their understanding and consent.      Signed: Autumn Messing 01/10/2014, 11:46 AM

## 2014-01-10 NOTE — Telephone Encounter (Signed)
Left Vm for pt we will need to move her appt on Tuesday 12/22 from 12:30 pm to 8 am so she can get labs done earlier.  Some of her labs take 24 hrs to get results which we need before she starts treatment on 12/23.  Asked her to call back to confirm she got message.

## 2014-01-14 ENCOUNTER — Ambulatory Visit: Payer: Medicare Other | Admitting: Hematology and Oncology

## 2014-01-14 ENCOUNTER — Ambulatory Visit (HOSPITAL_BASED_OUTPATIENT_CLINIC_OR_DEPARTMENT_OTHER): Payer: Medicare Other | Admitting: Hematology and Oncology

## 2014-01-14 ENCOUNTER — Ambulatory Visit (HOSPITAL_BASED_OUTPATIENT_CLINIC_OR_DEPARTMENT_OTHER): Payer: Medicare Other | Admitting: Lab

## 2014-01-14 ENCOUNTER — Other Ambulatory Visit: Payer: Medicare Other

## 2014-01-14 ENCOUNTER — Encounter: Payer: Self-pay | Admitting: Hematology and Oncology

## 2014-01-14 ENCOUNTER — Telehealth: Payer: Self-pay | Admitting: Hematology and Oncology

## 2014-01-14 ENCOUNTER — Other Ambulatory Visit: Payer: Self-pay | Admitting: Hematology and Oncology

## 2014-01-14 ENCOUNTER — Other Ambulatory Visit (HOSPITAL_COMMUNITY)
Admission: RE | Admit: 2014-01-14 | Discharge: 2014-01-14 | Disposition: A | Discharge status: Home / Self Care | Payer: Medicare Other | Source: Ambulatory Visit | Attending: Hematology and Oncology | Admitting: Hematology and Oncology

## 2014-01-14 VITALS — BP 141/50 | HR 51 | Temp 97.9°F | Resp 17 | Ht 66.0 in | Wt 187.3 lb

## 2014-01-14 DIAGNOSIS — C9110 Chronic lymphocytic leukemia of B-cell type not having achieved remission: Secondary | ICD-10-CM | POA: Insufficient documentation

## 2014-01-14 DIAGNOSIS — D630 Anemia in neoplastic disease: Secondary | ICD-10-CM

## 2014-01-14 DIAGNOSIS — N183 Chronic kidney disease, stage 3 unspecified: Secondary | ICD-10-CM

## 2014-01-14 LAB — CBC WITH DIFFERENTIAL/PLATELET
BASO%: 0.6 % (ref 0.0–2.0)
Basophils Absolute: 0.2 10*3/uL — ABNORMAL HIGH (ref 0.0–0.1)
EOS ABS: 0.5 10*3/uL (ref 0.0–0.5)
EOS%: 1.3 % (ref 0.0–7.0)
HEMATOCRIT: 34.1 % — AB (ref 34.8–46.6)
HEMOGLOBIN: 10.5 g/dL — AB (ref 11.6–15.9)
LYMPH#: 31.4 10*3/uL — AB (ref 0.9–3.3)
LYMPH%: 82.7 % — ABNORMAL HIGH (ref 14.0–49.7)
MCH: 29.9 pg (ref 25.1–34.0)
MCHC: 30.9 g/dL — ABNORMAL LOW (ref 31.5–36.0)
MCV: 96.7 fL (ref 79.5–101.0)
MONO#: 1 10*3/uL — ABNORMAL HIGH (ref 0.1–0.9)
MONO%: 2.7 % (ref 0.0–14.0)
NEUT%: 12.7 % — ABNORMAL LOW (ref 38.4–76.8)
NEUTROS ABS: 4.8 10*3/uL (ref 1.5–6.5)
Platelets: 277 10*3/uL (ref 145–400)
RBC: 3.53 10*6/uL — AB (ref 3.70–5.45)
RDW: 16.2 % — AB (ref 11.2–14.5)
WBC: 38 10*3/uL — ABNORMAL HIGH (ref 3.9–10.3)

## 2014-01-14 LAB — COMPREHENSIVE METABOLIC PANEL (CC13)
ALT: 19 U/L (ref 0–55)
ANION GAP: 10 meq/L (ref 3–11)
AST: 20 U/L (ref 5–34)
Albumin: 3.3 g/dL — ABNORMAL LOW (ref 3.5–5.0)
Alkaline Phosphatase: 89 U/L (ref 40–150)
BUN: 63.2 mg/dL — ABNORMAL HIGH (ref 7.0–26.0)
CALCIUM: 9 mg/dL (ref 8.4–10.4)
CO2: 25 meq/L (ref 22–29)
CREATININE: 2.4 mg/dL — AB (ref 0.6–1.1)
Chloride: 105 mEq/L (ref 98–109)
EGFR: 20 mL/min/{1.73_m2} — ABNORMAL LOW (ref >90)
Glucose: 123 mg/dl (ref 70–140)
POTASSIUM: 4.3 meq/L (ref 3.5–5.1)
Sodium: 140 mEq/L (ref 136–145)
Total Bilirubin: 0.26 mg/dL (ref 0.20–1.20)
Total Protein: 5.8 g/dL — ABNORMAL LOW (ref 6.4–8.3)

## 2014-01-14 LAB — IRON AND TIBC CHCC
%SAT: 17 % — AB (ref 21–57)
Iron: 66 ug/dL (ref 41–142)
TIBC: 390 ug/dL (ref 236–444)
UIBC: 324 ug/dL (ref 120–384)

## 2014-01-14 LAB — LACTATE DEHYDROGENASE (CC13): LDH: 234 U/L (ref 125–245)

## 2014-01-14 LAB — HEPATITIS B SURFACE ANTIGEN: Hepatitis B Surface Ag: NEGATIVE

## 2014-01-14 LAB — TECHNOLOGIST REVIEW

## 2014-01-14 LAB — HEPATITIS B CORE ANTIBODY, IGM: Hep B C IgM: NONREACTIVE

## 2014-01-14 LAB — HEPATITIS B SURFACE ANTIBODY,QUALITATIVE: Hep B S Ab: NEGATIVE

## 2014-01-14 LAB — FERRITIN CHCC: FERRITIN: 94 ng/mL (ref 9–269)

## 2014-01-14 LAB — URIC ACID (CC13): URIC ACID, SERUM: 3.7 mg/dL (ref 2.6–7.4)

## 2014-01-14 MED ORDER — ALLOPURINOL 300 MG PO TABS: 300.0000 mg | ORAL_TABLET | Freq: Every day | ORAL | Status: DC

## 2014-01-14 MED ORDER — ACYCLOVIR 400 MG PO TABS: 400.0000 mg | ORAL_TABLET | Freq: Every day | ORAL | Status: DC

## 2014-01-14 MED ORDER — LIDOCAINE-PRILOCAINE 2.5-2.5 % EX CREA: TOPICAL_CREAM | CUTANEOUS | Status: DC

## 2014-01-14 MED ORDER — PREDNISONE 50 MG PO TABS: ORAL_TABLET | ORAL | Status: DC

## 2014-01-14 NOTE — Assessment & Plan Note (Signed)
This is likely anemia of chronic disease. The patient denies recent history of bleeding such as epistaxis, hematuria or hematochezia. She is asymptomatic from the anemia. We will observe for now.  

## 2014-01-14 NOTE — Progress Notes (Signed)
[place] OFFICE PROGRESS NOTE  Patient Care Team: Zigmund Gottron, MD as PCP - General Johna Sheriff, MD (Ophthalmology) Christine Booze, MD as Consulting Physician (Cardiology) Hurman Horn, MD (Ophthalmology) Heath Lark, MD as Consulting Physician (Hematology and Oncology) Estanislado Emms, MD as Consulting Physician (Nephrology)  SUMMARY OF ONCOLOGIC HISTORY:   Chronic lymphocytic leukemia   10/13/2006 Initial Diagnosis Chronic lymphocytic leukemia   12/24/2013 Procedure Kidney biopsy showed renal involvement of CLL, with associated diabetic gromerulosclerosis, moderate to severe arterionephrosclerosis   01/10/2014 Imaging PET/CT scan showed no evidence of metastatic disease   01/10/2014 Procedure She has placement of port    INTERVAL HISTORY: Please see below for problem oriented charting. She is seen today before the start of chemotherapy and to review test results She tolerated port placement well without any side effects.  REVIEW OF SYSTEMS:   Constitutional: Denies fevers, chills or abnormal weight loss Eyes: Denies blurriness of vision Ears, nose, mouth, throat, and face: Denies mucositis or sore throat Respiratory: Denies cough, dyspnea or wheezes Cardiovascular: Denies palpitation, chest discomfort or lower extremity swelling Gastrointestinal:  Denies nausea, heartburn or change in bowel habits Skin: Denies abnormal skin rashes Lymphatics: Denies new lymphadenopathy or easy bruising Neurological:Denies numbness, tingling or new weaknesses Behavioral/Psych: Mood is stable, no new changes  All other systems were reviewed with the patient and are negative.  I have reviewed the past medical history, past surgical history, social history and family history with the patient and they are unchanged from previous note.  ALLERGIES:  is allergic to amoxicillin; cephalexin; and codeine phosphate.  MEDICATIONS:  Current Outpatient Prescriptions   Medication Sig Dispense Refill  . ACCU-CHEK AVIVA PLUS test strip TEST as directed three times a day 100 each 12  . allopurinol (ZYLOPRIM) 300 MG tablet TAKE 1/2 TABLET BY MOUTH ONCE DAILY 45 tablet 3  . Ascorbic Acid (VITAMIN C) 100 MG tablet Take 100 mg by mouth daily.    Marland Kitchen aspirin 81 MG tablet Take 81 mg by mouth daily.    Marland Kitchen atorvastatin (LIPITOR) 80 MG tablet TAKE 1 TABLET BY MOUTH EVERY DAY 90 tablet 2  . chlorambucil (LEUKERAN) 2 MG tablet Take 4 tabs on days 1 and 15 of chemotherapy 8 tablet 5  . Choline Fenofibrate (FENOFIBRIC ACID) 45 MG CPDR TAKE 1 CAPSULE BY MOUTH DAILY 90 capsule 2  . cloNIDine (CATAPRES - DOSED IN MG/24 HR) 0.2 mg/24hr patch Place 0.2 mg onto the skin once a week. Sunday    . clopidogrel (PLAVIX) 75 MG tablet TAKE 1 TABLET BY MOUTH DAILY 90 tablet 3  . felodipine (PLENDIL) 10 MG 24 hr tablet TAKE 2 TABLETS BY MOUTH EVERY DAY 180 tablet 1  . ferrous sulfate 325 (65 FE) MG tablet Take 1 tablet (325 mg total) by mouth daily with breakfast.  3  . furosemide (LASIX) 40 MG tablet Take 40 mg by mouth 2 (two) times daily with breakfast and lunch. Taking total of 80 mg daily.    Marland Kitchen GNP GARLIC EXTRACT PO Take 1 tablet by mouth daily.      . Insulin Glargine (LANTUS SOLOSTAR) 100 UNIT/ML SOPN Inject 35 Units into the skin daily. 5 pen 12  . insulin lispro (HUMALOG) 100 UNIT/ML KiwkPen Inject 5 Units into the skin 3 (three) times daily after meals.     . lidocaine-prilocaine (EMLA) cream Apply 1 application topically as needed. Apply to [facility] a Cath site one hour prior to needle stick. 30 g  2  . lisinopril (PRINIVIL,ZESTRIL) 40 MG tablet TAKE 1 TABLET BY MOUTH EVERY DAY 90 tablet 3  . Melatonin 5 MG CAPS Take 10 mg by mouth at bedtime.     . metoprolol tartrate (LOPRESSOR) 25 MG tablet TAKE 1/2 TABLET BY MOUTH TWICE DAILY 90 tablet 3  . Multiple Vitamins-Minerals (MULTIVITAMINS THER. W/MINERALS) TABS Take 1 tablet by mouth daily.      . Omega-3 Fatty Acids (FISH OIL) 1000 MG  CAPS Take 2,000 mg by mouth 2 (two) times daily.     Marland Kitchen OVER THE COUNTER MEDICATION Take 1 tablet by mouth 2 (two) times daily. Macular Degenerative Eye Supplement    . pantoprazole (PROTONIX) 40 MG tablet TAKE 1 TABLET BY MOUTH EVERY DAY 90 tablet 3  . PARoxetine (PAXIL) 20 MG tablet TAKE 1 TABLET BY MOUTH EVERY MORNING 90 tablet 3  . traMADol (ULTRAM) 50 MG tablet TAKE 1 TABLET BY MOUTH FOUR TIMES DAILY 360 tablet 1  . acyclovir (ZOVIRAX) 400 MG tablet Take 1 tablet (400 mg total) by mouth daily. 30 tablet 5  . allopurinol (ZYLOPRIM) 300 MG tablet Take 1 tablet (300 mg total) by mouth daily. 30 tablet 0  . lidocaine-prilocaine (EMLA) cream Apply to affected area once 30 g 3  . predniSONE (DELTASONE) 50 MG tablet Daily X 3 days start today, with breakfast/food 3 tablet 0   No current facility-administered medications for this visit.    PHYSICAL EXAMINATION: ECOG PERFORMANCE STATUS: 0 - Asymptomatic  Filed Vitals:   01/14/14 0826  BP: 141/50  Pulse: 51  Temp: 97.9 F (36.6 C)  Resp: 17   Filed Weights   01/14/14 0826  Weight: 187 lb 4.8 oz (84.959 kg)    GENERAL:alert, no distress and comfortable SKIN: skin color, texture, turgor are normal, no rashes or significant lesions EYES: normal, Conjunctiva are pink and non-injected, sclera clear OROPHARYNX:no exudate, no erythema and lips, buccal mucosa, and tongue normal  Musculoskeletal:no cyanosis of digits and no clubbing . The port site looks okay NEURO: alert & oriented x 3 with fluent speech, no focal motor/sensory deficits  LABORATORY DATA:  I have reviewed the data as listed    Component Value Date/Time   NA 144 01/01/2014 1117   NA 144 12/24/2013 0828   K 4.6 01/01/2014 1117   K 4.8 12/24/2013 0828   CL 106 12/24/2013 0828   CL 110* 05/28/2012 1259   CO2 26 01/01/2014 1117   CO2 25 12/24/2013 0828   GLUCOSE 84 01/01/2014 1117   GLUCOSE 97 12/24/2013 0828   GLUCOSE 202* 05/28/2012 1259   BUN 49.1* 01/01/2014 1117    BUN 47* 12/24/2013 0828   CREATININE 2.3* 01/01/2014 1117   CREATININE 2.26* 12/24/2013 0828   CREATININE 2.48* 09/11/2013 1408   CALCIUM 9.3 01/01/2014 1117   CALCIUM 9.2 12/24/2013 0828   PROT 6.3 01/08/2014 0807   PROT 6.1* 08/16/2013 1250   ALBUMIN 3.5 01/08/2014 0807   ALBUMIN 3.5 08/16/2013 1250   AST 19 01/08/2014 0807   AST 26 08/16/2013 1250   ALT 17 01/08/2014 0807   ALT 24 08/16/2013 1250   ALKPHOS 68 01/08/2014 0807   ALKPHOS 95 08/16/2013 1250   BILITOT 0.3 01/08/2014 0807   BILITOT 0.29 08/16/2013 1250   GFRNONAA 21* 12/24/2013 0828   GFRNONAA 32* 09/08/2010 0851   GFRAA 24* 12/24/2013 0828   GFRAA 38* 09/08/2010 0851    No results found for: SPEP, UPEP  Lab Results  Component Value Date  WBC 38.0* 01/14/2014   NEUTROABS 4.8 01/14/2014   HGB 10.5* 01/14/2014   HCT 34.1* 01/14/2014   MCV 96.7 01/14/2014   PLT 277 01/14/2014      Chemistry      Component Value Date/Time   NA 144 01/01/2014 1117   NA 144 12/24/2013 0828   K 4.6 01/01/2014 1117   K 4.8 12/24/2013 0828   CL 106 12/24/2013 0828   CL 110* 05/28/2012 1259   CO2 26 01/01/2014 1117   CO2 25 12/24/2013 0828   BUN 49.1* 01/01/2014 1117   BUN 47* 12/24/2013 0828   CREATININE 2.3* 01/01/2014 1117   CREATININE 2.26* 12/24/2013 0828   CREATININE 2.48* 09/11/2013 1408      Component Value Date/Time   CALCIUM 9.3 01/01/2014 1117   CALCIUM 9.2 12/24/2013 0828   ALKPHOS 68 01/08/2014 0807   ALKPHOS 95 08/16/2013 1250   AST 19 01/08/2014 0807   AST 26 08/16/2013 1250   ALT 17 01/08/2014 0807   ALT 24 08/16/2013 1250   BILITOT 0.3 01/08/2014 0807   BILITOT 0.29 08/16/2013 1250       RADIOGRAPHIC STUDIES: I reviewed the PET CT scan with her and family I have personally reviewed the radiological images as listed and agreed with the findings in the report.  ASSESSMENT & PLAN:  Chronic lymphocytic leukemia The decision was made based on publication at the [facility]. It is a category 1  recommendation from [employer]. Nelson Chimes, M.D., Johna Sheriff, M.D., Rubbie Battiest, M.S., Syliva Overman, M.D., Buckner Malta, M.D., Lana Fish. Roderick Pee, M.D., Bradd Burner, M.D., John Giovanni, M.D., Cecil Cobbs, M.D., Lina Sar, M.D., Annie Main Opat, M.D., Derenda Mis. Roxy Manns, M.D., Kathyrn Drown, M.D., Boyce Medici, M.D., Lequita Asal, M.D., Viona Gilmore, M.D., Beryle Beams, Ph.D., Ree Kida, M.D., Sheralyn Boatman, M.D., Tyrone Apple, M.[place]., Katherine Roan, B.[place]., Sharen Heck, M.D., and Bonney Leitz, M.D. Alison Stalling J Med 2014; [id number] 20, 2014DOI: 10.1056/[id number]  The chemotherapy consists of obinutuzumab & chlorambucil.  1. Chlorambucil was administered orally at a dose of 8mg  on days 1 and 15 of each cycle  2. Obinutuzumab was administered intravenously at a dose of 1000 mg on days 1, 8, and 15 of cycle 1 and on day 1 of cycles 2 through 6  3. Prophylaxis for infusion-related reactions and the tumor lysis syndrome included fluid intake and premedication with allopurinol, acetaminophen, antihistamines, and glucocorticoids. 4. Antimicrobial prophylaxis with acyclovir. The combination chemotherapy yield an average progression free survival approximately 26.7 months and response rates approximately 21%.  Some of the short term side-effects included, though not limited to, risk of fatigue, weight loss, tumor lysis syndrome, risk of allergic reactions, pancytopenia, life-threatening infections, need for transfusions of blood products, nausea, vomiting, change in bowel habits, risk of congestive heart failure, admission to hospital for various reasons, and risks of death.   Long term side-effects are also discussed including permanent damage to nerve function, chronic fatigue, and rare secondary malignancy including bone marrow disorders.   The patient is aware that the response rates discussed earlier is not guaranteed.    After  a long discussion, patient made an informed decision to proceed with the prescribed plan of care.  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 The patient denies recent history of bleeding such as epistaxis, hematuria or hematochezia. She is asymptomatic from the  anemia. We will observe for now.    Chronic kidney disease, stage III (moderate) She is currently being followed closely with the nephrologist.    Diabetic neuropathy She is warned about potential risk of severe hyperglycemia while on treatment. She is given permission to adjust short-acting insulin as needed to get her blood sugar under 200.   Orders Placed This Encounter  Procedures  . CBC with Differential    Standing Status: Future     Number of Occurrences:      Standing Expiration Date: 02/18/2015  . Comprehensive metabolic panel    Standing Status: Future     Number of Occurrences:      Standing Expiration Date: 02/18/2015  . Lactate dehydrogenase    Standing Status: Future     Number of Occurrences:      Standing Expiration Date: 02/18/2015  . Uric Acid    Standing Status: Future     Number of Occurrences:      Standing Expiration Date: 02/18/2015   All questions were answered. The patient knows to call the clinic with any problems, questions or concerns. No barriers to learning was detected. I spent 30 minutes counseling the patient face to face. The total time spent in the appointment was 40 minutes and more than 50% was on counseling and review of test results     [facility], [place], MD 01/14/2014 8:54 AM

## 2014-01-14 NOTE — Assessment & Plan Note (Signed)
The decision was made based on publication at the [facility]. It is a category 1 recommendation from [employer]. Nelson Chimes, M.D., Johna Sheriff, M.D., Rubbie Battiest, M.S., Syliva Overman, M.D., Buckner Malta, M.D., Lana Fish. Roderick Pee, M.D., Bradd Burner, M.D., John Giovanni, M.D., Cecil Cobbs, M.D., Lina Sar, M.D., Annie Main Opat, M.D., Derenda Mis. Roxy Manns, M.D., Kathyrn Drown, M.D., Boyce Medici, M.D., Lequita Asal, M.D., Viona Gilmore, M.D., Beryle Beams, Ph.D., Ree Kida, M.D., Sheralyn Boatman, M.D., Tyrone Apple, M.[place]., Katherine Roan, B.[place]., Sharen Heck, M.D., and Bonney Leitz, M.D. Alison Stalling J Med 2014; [id number] 20, 2014DOI: 10.1056/[id number]  The chemotherapy consists of obinutuzumab & chlorambucil.  1. Chlorambucil was administered orally at a dose of 8mg  on days 1 and 15 of each cycle  2. Obinutuzumab was administered intravenously at a dose of 1000 mg on days 1, 8, and 15 of cycle 1 and on day 1 of cycles 2 through 6  3. Prophylaxis for infusion-related reactions and the tumor lysis syndrome included fluid intake and premedication with allopurinol, acetaminophen, antihistamines, and glucocorticoids. 4. Antimicrobial prophylaxis with acyclovir. The combination chemotherapy yield an average progression free survival approximately 26.7 months and response rates approximately 21%.  Some of the short term side-effects included, though not limited to, risk of fatigue, weight loss, tumor lysis syndrome, risk of allergic reactions, pancytopenia, life-threatening infections, need for transfusions of blood products, nausea, vomiting, change in bowel habits, risk of congestive heart failure, admission to hospital for various reasons, and risks of death.   Long term side-effects are also discussed including permanent damage to nerve function, chronic fatigue, and rare secondary malignancy including bone marrow disorders.   The patient is  aware that the response rates discussed earlier is not guaranteed.    After a long discussion, patient made an informed decision to proceed with the prescribed plan of care.  I will give her 50 mg prednisone today, tomorrow and the next day to reduce risk of allergic reaction

## 2014-01-14 NOTE — Telephone Encounter (Signed)
Gave avs & cal for Dec/Jan. Sent mess to sch tx °

## 2014-01-14 NOTE — Assessment & Plan Note (Signed)
She is currently being followed closely with the nephrologist.

## 2014-01-14 NOTE — Assessment & Plan Note (Signed)
She is warned about potential risk of severe hyperglycemia while on treatment. She is given permission to adjust short-acting insulin as needed to get her blood sugar under 200.

## 2014-01-15 ENCOUNTER — Ambulatory Visit (HOSPITAL_BASED_OUTPATIENT_CLINIC_OR_DEPARTMENT_OTHER): Payer: Medicare Other

## 2014-01-15 DIAGNOSIS — C9110 Chronic lymphocytic leukemia of B-cell type not having achieved remission: Secondary | ICD-10-CM | POA: Diagnosis not present

## 2014-01-15 DIAGNOSIS — Z5112 Encounter for antineoplastic immunotherapy: Secondary | ICD-10-CM | POA: Diagnosis not present

## 2014-01-15 LAB — FLOW CYTOMETRY

## 2014-01-15 MED ORDER — ACETAMINOPHEN 325 MG PO TABS
650.0000 mg | ORAL_TABLET | Freq: Once | ORAL | Status: AC
  Administered 2014-01-15: 650 mg via ORAL

## 2014-01-15 MED ORDER — SODIUM CHLORIDE 0.9 % IV SOLN
100.0000 mg | Freq: Once | INTRAVENOUS | Status: AC
  Administered 2014-01-15: 100 mg via INTRAVENOUS
  Filled 2014-01-15: qty 4

## 2014-01-15 MED ORDER — DEXAMETHASONE SODIUM PHOSPHATE 20 MG/5ML IJ SOLN
20.0000 mg | Freq: Once | INTRAMUSCULAR | Status: AC
  Administered 2014-01-15: 20 mg via INTRAVENOUS

## 2014-01-15 MED ORDER — DIPHENHYDRAMINE HCL 50 MG/ML IJ SOLN
INTRAMUSCULAR | Status: AC
  Filled 2014-01-15: qty 1

## 2014-01-15 MED ORDER — SODIUM CHLORIDE 0.9 % IV SOLN
Freq: Once | INTRAVENOUS | Status: AC
  Administered 2014-01-15: 09:00:00 via INTRAVENOUS

## 2014-01-15 MED ORDER — HEPARIN SOD (PORK) LOCK FLUSH 100 UNIT/ML IV SOLN
500.0000 [IU] | Freq: Once | INTRAVENOUS | Status: AC | PRN
  Administered 2014-01-15: 500 [IU]
  Filled 2014-01-15: qty 5

## 2014-01-15 MED ORDER — SODIUM CHLORIDE 0.9 % IJ SOLN
10.0000 mL | INTRAMUSCULAR | Status: DC | PRN
  Administered 2014-01-15: 10 mL
  Filled 2014-01-15: qty 10

## 2014-01-15 MED ORDER — ACETAMINOPHEN 325 MG PO TABS
ORAL_TABLET | ORAL | Status: AC
  Filled 2014-01-15: qty 2

## 2014-01-15 MED ORDER — DEXAMETHASONE SODIUM PHOSPHATE 20 MG/5ML IJ SOLN
INTRAMUSCULAR | Status: AC
  Filled 2014-01-15: qty 5

## 2014-01-15 MED ORDER — DIPHENHYDRAMINE HCL 50 MG/ML IJ SOLN
50.0000 mg | Freq: Once | INTRAMUSCULAR | Status: AC
  Administered 2014-01-15: 50 mg via INTRAVENOUS

## 2014-01-15 NOTE — Progress Notes (Signed)
Ok to treat with increased creatinine per Dr. Alvy Bimler.  PAC site still swollen and tender from insertion 5 days ago. Instructed patient that she could put an ice pack on site 2x during the next several hours to help ease discomfort and swelling. Pt. Voiced understanding. Reviewed Visit summary with pt.

## 2014-01-15 NOTE — Patient Instructions (Signed)
[facility] Discharge Instructions for Patients Receiving Chemotherapy  Today you received the following chemotherapy agents: Gadyzva  To help prevent nausea and vomiting after your treatment, we encourage you to take your nausea medication: as directed   If you develop nausea and vomiting that is not controlled by your nausea medication, call the clinic.   BELOW ARE SYMPTOMS THAT SHOULD BE REPORTED IMMEDIATELY:  *FEVER GREATER THAN 100.5 F  *CHILLS WITH OR WITHOUT FEVER  NAUSEA AND VOMITING THAT IS NOT CONTROLLED WITH YOUR NAUSEA MEDICATION  *UNUSUAL SHORTNESS OF BREATH  *UNUSUAL BRUISING OR BLEEDING  TENDERNESS IN MOUTH AND THROAT WITH OR WITHOUT PRESENCE OF ULCERS  *URINARY PROBLEMS  *BOWEL PROBLEMS  UNUSUAL RASH Items with * indicate a potential emergency and should be followed up as soon as possible.  Feel free to call the clinic you have any questions or concerns. The clinic phone number is [phone].

## 2014-01-16 ENCOUNTER — Ambulatory Visit (HOSPITAL_BASED_OUTPATIENT_CLINIC_OR_DEPARTMENT_OTHER): Payer: Medicare Other

## 2014-01-16 DIAGNOSIS — Z5112 Encounter for antineoplastic immunotherapy: Secondary | ICD-10-CM

## 2014-01-16 DIAGNOSIS — C9110 Chronic lymphocytic leukemia of B-cell type not having achieved remission: Secondary | ICD-10-CM | POA: Diagnosis not present

## 2014-01-16 MED ORDER — ACETAMINOPHEN 325 MG PO TABS
650.0000 mg | ORAL_TABLET | Freq: Once | ORAL | Status: AC
  Administered 2014-01-16: 650 mg via ORAL

## 2014-01-16 MED ORDER — SODIUM CHLORIDE 0.9 % IJ SOLN
10.0000 mL | INTRAMUSCULAR | Status: DC | PRN
  Administered 2014-01-16: 10 mL
  Filled 2014-01-16: qty 10

## 2014-01-16 MED ORDER — DEXAMETHASONE SODIUM PHOSPHATE 20 MG/5ML IJ SOLN
20.0000 mg | Freq: Once | INTRAMUSCULAR | Status: AC
  Administered 2014-01-16: 20 mg via INTRAVENOUS

## 2014-01-16 MED ORDER — ACETAMINOPHEN 325 MG PO TABS
ORAL_TABLET | ORAL | Status: AC
  Filled 2014-01-16: qty 2

## 2014-01-16 MED ORDER — DIPHENHYDRAMINE HCL 50 MG/ML IJ SOLN
50.0000 mg | Freq: Once | INTRAMUSCULAR | Status: AC
  Administered 2014-01-16: 50 mg via INTRAVENOUS

## 2014-01-16 MED ORDER — SODIUM CHLORIDE 0.9 % IV SOLN
900.0000 mg | Freq: Once | INTRAVENOUS | Status: AC
  Administered 2014-01-16: 900 mg via INTRAVENOUS
  Filled 2014-01-16: qty 36

## 2014-01-16 MED ORDER — SODIUM CHLORIDE 0.9 % IV SOLN
Freq: Once | INTRAVENOUS | Status: AC
  Administered 2014-01-16: 09:00:00 via INTRAVENOUS

## 2014-01-16 MED ORDER — DEXAMETHASONE SODIUM PHOSPHATE 20 MG/5ML IJ SOLN
INTRAMUSCULAR | Status: AC
  Filled 2014-01-16: qty 5

## 2014-01-16 MED ORDER — DIPHENHYDRAMINE HCL 50 MG/ML IJ SOLN
INTRAMUSCULAR | Status: AC
  Filled 2014-01-16: qty 1

## 2014-01-16 MED ORDER — HEPARIN SOD (PORK) LOCK FLUSH 100 UNIT/ML IV SOLN
500.0000 [IU] | Freq: Once | INTRAVENOUS | Status: AC | PRN
  Administered 2014-01-16: 500 [IU]
  Filled 2014-01-16: qty 5

## 2014-01-16 NOTE — Patient Instructions (Addendum)
[facility] Discharge Instructions for Patients Receiving Chemotherapy  Today you received the following chemotherapy agents Gazyva.  To help prevent nausea and vomiting after your treatment, we encourage you to take your nausea medication.   If you develop nausea and vomiting that is not controlled by your nausea medication, call the clinic.   BELOW ARE SYMPTOMS THAT SHOULD BE REPORTED IMMEDIATELY:  *FEVER GREATER THAN 100.5 F  *CHILLS WITH OR WITHOUT FEVER  NAUSEA AND VOMITING THAT IS NOT CONTROLLED WITH YOUR NAUSEA MEDICATION  *UNUSUAL SHORTNESS OF BREATH  *UNUSUAL BRUISING OR BLEEDING  TENDERNESS IN MOUTH AND THROAT WITH OR WITHOUT PRESENCE OF ULCERS  *URINARY PROBLEMS  *BOWEL PROBLEMS  UNUSUAL RASH Items with * indicate a potential emergency and should be followed up as soon as possible.  Feel free to call the clinic you have any questions or concerns. The clinic phone number is [phone].

## 2014-01-21 ENCOUNTER — Telehealth: Payer: Self-pay | Admitting: *Deleted

## 2014-01-21 ENCOUNTER — Other Ambulatory Visit: Payer: Self-pay | Admitting: Hematology and Oncology

## 2014-01-21 DIAGNOSIS — C9110 Chronic lymphocytic leukemia of B-cell type not having achieved remission: Secondary | ICD-10-CM

## 2014-01-21 NOTE — Telephone Encounter (Signed)
Called Christine Santos for chemotherapy F/U.  Patient is doing well.  Denies n/v currently but did have nausea.  Denies any new side effects or symptoms.  Bowels didn't move well initially and was nauseated.  Now bowels have moved the nausea has passed.  Bladder is functioning well.  Eating and drinking well and I instructed to drink 64 oz minimum daily or at least the day before, of and after treatment.  Denies questions at this time and encouraged to call if needed.  Reviewed how to call after hours in the case of an emergency.

## 2014-01-21 NOTE — Telephone Encounter (Signed)
-----   Message from Otila Kluver, RN sent at 01/15/2014  2:47 PM EST ----- Regarding: 1st time chemo Contact: [phone] 1st time [facility] on 12/23. Will get larger dose on 01/16/14. No reaction on 01/15/14

## 2014-01-22 ENCOUNTER — Other Ambulatory Visit (HOSPITAL_BASED_OUTPATIENT_CLINIC_OR_DEPARTMENT_OTHER): Payer: Medicare Other

## 2014-01-22 ENCOUNTER — Ambulatory Visit (HOSPITAL_BASED_OUTPATIENT_CLINIC_OR_DEPARTMENT_OTHER): Payer: Medicare Other

## 2014-01-22 DIAGNOSIS — C9110 Chronic lymphocytic leukemia of B-cell type not having achieved remission: Secondary | ICD-10-CM

## 2014-01-22 DIAGNOSIS — Z5112 Encounter for antineoplastic immunotherapy: Secondary | ICD-10-CM

## 2014-01-22 LAB — CBC WITH DIFFERENTIAL/PLATELET
BASO%: 0.4 % (ref 0.0–2.0)
Basophils Absolute: 0 10*3/uL (ref 0.0–0.1)
EOS%: 4.3 % (ref 0.0–7.0)
Eosinophils Absolute: 0.3 10*3/uL (ref 0.0–0.5)
HCT: 37 % (ref 34.8–46.6)
HGB: 11.8 g/dL (ref 11.6–15.9)
LYMPH%: 17 % (ref 14.0–49.7)
MCH: 30.6 pg (ref 25.1–34.0)
MCHC: 31.9 g/dL (ref 31.5–36.0)
MCV: 95.9 fL (ref 79.5–101.0)
MONO#: 0.6 10*3/uL (ref 0.1–0.9)
MONO%: 8 % (ref 0.0–14.0)
NEUT%: 70.3 % (ref 38.4–76.8)
NEUTROS ABS: 5.5 10*3/uL (ref 1.5–6.5)
Platelets: 215 10*3/uL (ref 145–400)
RBC: 3.86 10*6/uL (ref 3.70–5.45)
RDW: 16.4 % — AB (ref 11.2–14.5)
WBC: 7.8 10*3/uL (ref 3.9–10.3)
lymph#: 1.3 10*3/uL (ref 0.9–3.3)

## 2014-01-22 LAB — COMPREHENSIVE METABOLIC PANEL (CC13)
ALT: 33 U/L (ref 0–55)
ANION GAP: 9 meq/L (ref 3–11)
AST: 31 U/L (ref 5–34)
Albumin: 3.3 g/dL — ABNORMAL LOW (ref 3.5–5.0)
Alkaline Phosphatase: 79 U/L (ref 40–150)
BILIRUBIN TOTAL: 0.48 mg/dL (ref 0.20–1.20)
BUN: 58.5 mg/dL — ABNORMAL HIGH (ref 7.0–26.0)
CHLORIDE: 107 meq/L (ref 98–109)
CO2: 26 mEq/L (ref 22–29)
CREATININE: 2.4 mg/dL — AB (ref 0.6–1.1)
Calcium: 9 mg/dL (ref 8.4–10.4)
EGFR: 20 mL/min/{1.73_m2} — AB (ref >90)
GLUCOSE: 69 mg/dL — AB (ref 70–140)
Potassium: 4.8 mEq/L (ref 3.5–5.1)
SODIUM: 143 meq/L (ref 136–145)
TOTAL PROTEIN: 5.9 g/dL — AB (ref 6.4–8.3)

## 2014-01-22 LAB — LACTATE DEHYDROGENASE (CC13): LDH: 267 U/L — AB (ref 125–245)

## 2014-01-22 MED ORDER — ACETAMINOPHEN 325 MG PO TABS
650.0000 mg | ORAL_TABLET | Freq: Once | ORAL | Status: AC
  Administered 2014-01-22: 650 mg via ORAL

## 2014-01-22 MED ORDER — HEPARIN SOD (PORK) LOCK FLUSH 100 UNIT/ML IV SOLN
500.0000 [IU] | Freq: Once | INTRAVENOUS | Status: AC | PRN
  Administered 2014-01-22: 500 [IU]
  Filled 2014-01-22: qty 5

## 2014-01-22 MED ORDER — ACETAMINOPHEN 325 MG PO TABS
ORAL_TABLET | ORAL | Status: AC
  Filled 2014-01-22: qty 2

## 2014-01-22 MED ORDER — DEXAMETHASONE SODIUM PHOSPHATE 20 MG/5ML IJ SOLN
INTRAMUSCULAR | Status: AC
  Filled 2014-01-22: qty 5

## 2014-01-22 MED ORDER — SODIUM CHLORIDE 0.9 % IV SOLN
Freq: Once | INTRAVENOUS | Status: AC
  Administered 2014-01-22: 13:00:00 via INTRAVENOUS

## 2014-01-22 MED ORDER — DIPHENHYDRAMINE HCL 50 MG/ML IJ SOLN
50.0000 mg | Freq: Once | INTRAMUSCULAR | Status: AC
  Administered 2014-01-22: 50 mg via INTRAVENOUS

## 2014-01-22 MED ORDER — SODIUM CHLORIDE 0.9 % IV SOLN
1000.0000 mg | Freq: Once | INTRAVENOUS | Status: AC
  Administered 2014-01-22: 1000 mg via INTRAVENOUS
  Filled 2014-01-22: qty 40

## 2014-01-22 MED ORDER — DIPHENHYDRAMINE HCL 50 MG/ML IJ SOLN
INTRAMUSCULAR | Status: AC
  Filled 2014-01-22: qty 1

## 2014-01-22 MED ORDER — DEXAMETHASONE SODIUM PHOSPHATE 20 MG/5ML IJ SOLN
20.0000 mg | Freq: Once | INTRAMUSCULAR | Status: AC
  Administered 2014-01-22: 20 mg via INTRAVENOUS

## 2014-01-22 MED ORDER — SODIUM CHLORIDE 0.9 % IJ SOLN
10.0000 mL | INTRAMUSCULAR | Status: DC | PRN
  Administered 2014-01-22: 10 mL
  Filled 2014-01-22: qty 10

## 2014-01-22 NOTE — Patient Instructions (Addendum)
[place] Discharge Instructions for Patients Receiving Chemotherapy  Today you received the following chemotherapy agents: Gazyva   BELOW ARE SYMPTOMS THAT SHOULD BE REPORTED IMMEDIATELY:  *FEVER GREATER THAN 100.5 F  *CHILLS WITH OR WITHOUT FEVER  NAUSEA AND VOMITING THAT IS NOT CONTROLLED WITH YOUR NAUSEA MEDICATION  *UNUSUAL SHORTNESS OF BREATH  *UNUSUAL BRUISING OR BLEEDING  TENDERNESS IN MOUTH AND THROAT WITH OR WITHOUT PRESENCE OF ULCERS  *URINARY PROBLEMS  *BOWEL PROBLEMS  UNUSUAL RASH Items with * indicate a potential emergency and should be followed up as soon as possible.  Feel free to call the clinic you have any questions or concerns. The clinic phone number is [phone].

## 2014-01-23 ENCOUNTER — Ambulatory Visit: Payer: Medicare Other

## 2014-01-23 LAB — TISSUE HYBRIDIZATION TO NCBH

## 2014-01-23 LAB — FISH, PERIPHERAL BLOOD

## 2014-01-27 ENCOUNTER — Telehealth: Payer: Self-pay | Admitting: Family Medicine

## 2014-01-27 MED ORDER — INSULIN LISPRO 100 UNIT/ML (KWIKPEN): 5.0000 [IU] | PEN_INJECTOR | Freq: Three times a day (TID) | SUBCUTANEOUS | Status: DC

## 2014-01-27 NOTE — Telephone Encounter (Signed)
Had chemo 12/23 12/34 and 12/30. Sugar levels have been going up Was told to increase her hemolog  Would like to talk to dr Andria Frames

## 2014-01-29 ENCOUNTER — Ambulatory Visit (HOSPITAL_BASED_OUTPATIENT_CLINIC_OR_DEPARTMENT_OTHER): Payer: Medicare Other | Admitting: Hematology and Oncology

## 2014-01-29 ENCOUNTER — Other Ambulatory Visit (HOSPITAL_BASED_OUTPATIENT_CLINIC_OR_DEPARTMENT_OTHER): Payer: Medicare Other

## 2014-01-29 ENCOUNTER — Telehealth: Payer: Self-pay | Admitting: *Deleted

## 2014-01-29 ENCOUNTER — Ambulatory Visit (HOSPITAL_BASED_OUTPATIENT_CLINIC_OR_DEPARTMENT_OTHER): Payer: Medicare Other

## 2014-01-29 ENCOUNTER — Encounter: Payer: Self-pay | Admitting: Hematology and Oncology

## 2014-01-29 ENCOUNTER — Telehealth: Payer: Self-pay | Admitting: Hematology and Oncology

## 2014-01-29 VITALS — BP 151/58 | HR 65 | Temp 98.0°F | Resp 18 | Ht 66.0 in | Wt 185.3 lb

## 2014-01-29 DIAGNOSIS — C9110 Chronic lymphocytic leukemia of B-cell type not having achieved remission: Secondary | ICD-10-CM

## 2014-01-29 DIAGNOSIS — Z5112 Encounter for antineoplastic immunotherapy: Secondary | ICD-10-CM | POA: Diagnosis not present

## 2014-01-29 DIAGNOSIS — D630 Anemia in neoplastic disease: Secondary | ICD-10-CM

## 2014-01-29 DIAGNOSIS — E1140 Type 2 diabetes mellitus with diabetic neuropathy, unspecified: Secondary | ICD-10-CM

## 2014-01-29 DIAGNOSIS — N183 Chronic kidney disease, stage 3 unspecified: Secondary | ICD-10-CM

## 2014-01-29 LAB — CBC WITH DIFFERENTIAL/PLATELET
BASO%: 1 % (ref 0.0–2.0)
BASOS ABS: 0.1 10*3/uL (ref 0.0–0.1)
EOS ABS: 0.2 10*3/uL (ref 0.0–0.5)
EOS%: 2.8 % (ref 0.0–7.0)
HEMATOCRIT: 36.8 % (ref 34.8–46.6)
HGB: 11.4 g/dL — ABNORMAL LOW (ref 11.6–15.9)
LYMPH%: 13.3 % — ABNORMAL LOW (ref 14.0–49.7)
MCH: 29.6 pg (ref 25.1–34.0)
MCHC: 31.1 g/dL — AB (ref 31.5–36.0)
MCV: 95.1 fL (ref 79.5–101.0)
MONO#: 0.6 10*3/uL (ref 0.1–0.9)
MONO%: 8.9 % (ref 0.0–14.0)
NEUT#: 5.1 10*3/uL (ref 1.5–6.5)
NEUT%: 74 % (ref 38.4–76.8)
Platelets: 208 10*3/uL (ref 145–400)
RBC: 3.87 10*6/uL (ref 3.70–5.45)
RDW: 16.3 % — AB (ref 11.2–14.5)
WBC: 6.9 10*3/uL (ref 3.9–10.3)
lymph#: 0.9 10*3/uL (ref 0.9–3.3)

## 2014-01-29 LAB — URIC ACID (CC13): Uric Acid, Serum: 3.9 mg/dl (ref 2.6–7.4)

## 2014-01-29 LAB — COMPREHENSIVE METABOLIC PANEL (CC13)
ALT: 23 U/L (ref 0–55)
ANION GAP: 9 meq/L (ref 3–11)
AST: 22 U/L (ref 5–34)
Albumin: 3.2 g/dL — ABNORMAL LOW (ref 3.5–5.0)
Alkaline Phosphatase: 83 U/L (ref 40–150)
BILIRUBIN TOTAL: 0.34 mg/dL (ref 0.20–1.20)
BUN: 46.4 mg/dL — AB (ref 7.0–26.0)
CALCIUM: 9.2 mg/dL (ref 8.4–10.4)
CO2: 27 mEq/L (ref 22–29)
Chloride: 110 mEq/L — ABNORMAL HIGH (ref 98–109)
Creatinine: 2 mg/dL — ABNORMAL HIGH (ref 0.6–1.1)
EGFR: 25 mL/min/{1.73_m2} — ABNORMAL LOW (ref >90)
Glucose: 115 mg/dl (ref 70–140)
POTASSIUM: 4.4 meq/L (ref 3.5–5.1)
Sodium: 146 mEq/L — ABNORMAL HIGH (ref 136–145)
Total Protein: 5.8 g/dL — ABNORMAL LOW (ref 6.4–8.3)

## 2014-01-29 LAB — LACTATE DEHYDROGENASE (CC13): LDH: 230 U/L (ref 125–245)

## 2014-01-29 MED ORDER — HEPARIN SOD (PORK) LOCK FLUSH 100 UNIT/ML IV SOLN
500.0000 [IU] | Freq: Once | INTRAVENOUS | Status: AC | PRN
  Administered 2014-01-29: 500 [IU]
  Filled 2014-01-29: qty 5

## 2014-01-29 MED ORDER — ACETAMINOPHEN 325 MG PO TABS
ORAL_TABLET | ORAL | Status: AC
  Filled 2014-01-29: qty 2

## 2014-01-29 MED ORDER — DIPHENHYDRAMINE HCL 50 MG/ML IJ SOLN
50.0000 mg | Freq: Once | INTRAMUSCULAR | Status: AC
  Administered 2014-01-29: 50 mg via INTRAVENOUS

## 2014-01-29 MED ORDER — ACETAMINOPHEN 325 MG PO TABS
650.0000 mg | ORAL_TABLET | Freq: Once | ORAL | Status: AC
  Administered 2014-01-29: 650 mg via ORAL

## 2014-01-29 MED ORDER — SODIUM CHLORIDE 0.9 % IJ SOLN
10.0000 mL | INTRAMUSCULAR | Status: DC | PRN
  Administered 2014-01-29: 10 mL
  Filled 2014-01-29: qty 10

## 2014-01-29 MED ORDER — DEXAMETHASONE SODIUM PHOSPHATE 20 MG/5ML IJ SOLN
INTRAMUSCULAR | Status: AC
  Filled 2014-01-29: qty 5

## 2014-01-29 MED ORDER — DEXAMETHASONE SODIUM PHOSPHATE 20 MG/5ML IJ SOLN
20.0000 mg | Freq: Once | INTRAMUSCULAR | Status: AC
  Administered 2014-01-29: 20 mg via INTRAVENOUS

## 2014-01-29 MED ORDER — SODIUM CHLORIDE 0.9 % IV SOLN
Freq: Once | INTRAVENOUS | Status: AC
  Administered 2014-01-29: 13:00:00 via INTRAVENOUS

## 2014-01-29 MED ORDER — DIPHENHYDRAMINE HCL 50 MG/ML IJ SOLN
INTRAMUSCULAR | Status: AC
  Filled 2014-01-29: qty 1

## 2014-01-29 MED ORDER — SODIUM CHLORIDE 0.9 % IV SOLN
1000.0000 mg | Freq: Once | INTRAVENOUS | Status: AC
  Administered 2014-01-29: 1000 mg via INTRAVENOUS
  Filled 2014-01-29: qty 40

## 2014-01-29 NOTE — Progress Notes (Signed)
Discharged at 1725 in no distress with family.

## 2014-01-29 NOTE — Telephone Encounter (Signed)
First avaialble on 1/20 was 130

## 2014-01-29 NOTE — Progress Notes (Signed)
Gazyva rate increased to 400 mg/hr or 116 ml/hr x 58

## 2014-01-29 NOTE — Progress Notes (Signed)
Gazyva tolerated well, VSS, rate increased to 300 mg/hr or 87.2 ml/hr x 43.6 ml

## 2014-01-29 NOTE — Telephone Encounter (Signed)
Gave avs & cal for Jan. Sent mess to sch tx. °

## 2014-01-29 NOTE — Patient Instructions (Signed)
[place] Discharge Instructions for Patients Receiving Chemotherapy  Today you received the following chemotherapy agents Gazyva.  To help prevent nausea and vomiting after your treatment, we encourage you to take your nausea medication, benadryl as needed.   If you develop nausea and vomiting that is not controlled by your nausea medication, call the clinic.   BELOW ARE SYMPTOMS THAT SHOULD BE REPORTED IMMEDIATELY:  *FEVER GREATER THAN 100.5 F  *CHILLS WITH OR WITHOUT FEVER  NAUSEA AND VOMITING THAT IS NOT CONTROLLED WITH YOUR NAUSEA MEDICATION  *UNUSUAL SHORTNESS OF BREATH  *UNUSUAL BRUISING OR BLEEDING  TENDERNESS IN MOUTH AND THROAT WITH OR WITHOUT PRESENCE OF ULCERS  *URINARY PROBLEMS  *BOWEL PROBLEMS  UNUSUAL RASH Items with * indicate a potential emergency and should be followed up as soon as possible.  Feel free to call the clinic you have any questions or concerns. The clinic phone number is [phone].

## 2014-01-29 NOTE — Assessment & Plan Note (Signed)
Overall, she tolerated treatment well. We'll continue the same treatment without dose adjustment

## 2014-01-29 NOTE — Assessment & Plan Note (Signed)
She is currently being followed closely with the nephrologist.

## 2014-01-29 NOTE — Telephone Encounter (Signed)
Per staff message and POF I have scheduled appts. Advised scheduler of appts. JMW  

## 2014-01-29 NOTE — Progress Notes (Signed)
Gazyva started at 100 mg/hr or 29 ml/hr x 15 ml.  VSS

## 2014-01-29 NOTE — Assessment & Plan Note (Signed)
This is likely anemia of chronic disease. The patient denies recent history of bleeding such as epistaxis, hematuria or hematochezia. She is asymptomatic from the anemia. We will observe for now.  

## 2014-01-29 NOTE — Progress Notes (Signed)
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 She is seen prior to week 3 of therapy. She tolerated treatment well apart from recent confusion. The patient was noted to have mild hypoglycemia recently. Upon further questioning, she is not consistent with her blood sugar monitoring at home.  REVIEW OF SYSTEMS:   Constitutional: Denies fevers, chills or abnormal weight loss Eyes: Denies blurriness of vision Ears, nose, mouth, throat, and face: Denies mucositis or sore throat Respiratory: Denies cough, dyspnea or wheezes Cardiovascular: Denies palpitation, chest discomfort or lower extremity swelling Gastrointestinal:  Denies nausea, heartburn or change in bowel habits Skin: Denies abnormal skin rashes Lymphatics: Denies new lymphadenopathy or easy bruising Neurological:Denies numbness, tingling or new weaknesses Behavioral/Psych: Mood is stable, no new changes  All other systems were reviewed with the patient and are negative.  I have reviewed the past medical history, past surgical history, social history and family history with  the patient and they are unchanged from previous note.  ALLERGIES:  is allergic to amoxicillin; cephalexin; and codeine phosphate.  MEDICATIONS:  Current Outpatient Prescriptions  Medication Sig Dispense Refill  . ACCU-CHEK AVIVA PLUS test strip TEST as directed three times a day 100 each 12  . acyclovir (ZOVIRAX) 400 MG tablet Take 1 tablet (400 mg total) by mouth daily. 30 tablet 5  . allopurinol (ZYLOPRIM) 300 MG tablet TAKE 1/2 TABLET BY MOUTH ONCE DAILY 45 tablet 3  . allopurinol (ZYLOPRIM) 300 MG tablet Take 1 tablet (300 mg total) by mouth daily. 30 tablet 0  . Ascorbic Acid (VITAMIN C) 100 MG tablet Take 100 mg by mouth daily.    Marland Kitchen aspirin 81 MG tablet Take 81 mg by mouth daily.    Marland Kitchen atorvastatin (LIPITOR) 80 MG tablet TAKE 1 TABLET BY MOUTH EVERY DAY 90 tablet 2  . chlorambucil (LEUKERAN) 2 MG tablet Take 4 tabs on days 1 and 15 of chemotherapy 8 tablet 5  . Choline Fenofibrate (FENOFIBRIC ACID) 45 MG CPDR TAKE 1 CAPSULE BY MOUTH DAILY 90 capsule 2  . cloNIDine (CATAPRES - DOSED IN MG/24 HR) 0.2 mg/24hr patch Place 0.2 mg onto the skin once a week. Sunday    . clopidogrel (PLAVIX) 75 MG tablet TAKE 1 TABLET BY MOUTH DAILY 90 tablet 3  . felodipine (PLENDIL) 10 MG 24 hr tablet TAKE 2 TABLETS BY MOUTH EVERY DAY 180 tablet 1  . ferrous sulfate 325 (65 FE) MG tablet Take 1 tablet (325 mg total) by mouth daily with breakfast.  3  . furosemide (LASIX) 40 MG tablet Take 40 mg by mouth 2 (two) times daily with breakfast and lunch. Taking total of 80 mg daily.    Marland Kitchen GNP GARLIC EXTRACT PO Take 1 tablet by mouth daily.      Marland Kitchen  Insulin Glargine (LANTUS SOLOSTAR) 100 UNIT/ML SOPN Inject 35 Units into the skin daily. 5 pen 12  . insulin lispro (HUMALOG) 100 UNIT/ML KiwkPen Inject 0.05 mLs (5 Units total) into the skin 3 (three) times daily before meals. 15 mL 3  . lidocaine-prilocaine (EMLA) cream Apply 1 application topically as needed. Apply to [facility] a Cath site one hour prior to needle stick. 30 g  2  . lidocaine-prilocaine (EMLA) cream Apply to affected area once 30 g 3  . lisinopril (PRINIVIL,ZESTRIL) 40 MG tablet TAKE 1 TABLET BY MOUTH EVERY DAY 90 tablet 3  . Melatonin 5 MG CAPS Take 10 mg by mouth at bedtime.     . metoprolol tartrate (LOPRESSOR) 25 MG tablet TAKE 1/2 TABLET BY MOUTH TWICE DAILY 90 tablet 3  . Multiple Vitamins-Minerals (MULTIVITAMINS THER. W/MINERALS) TABS Take 1 tablet by mouth daily.      . Omega-3 Fatty Acids (FISH OIL) 1000 MG CAPS Take 2,000 mg by mouth 2 (two) times daily.     Marland Kitchen OVER THE COUNTER MEDICATION Take 1 tablet by mouth 2 (two) times daily. Macular Degenerative Eye Supplement    . pantoprazole (PROTONIX) 40 MG tablet TAKE 1 TABLET BY MOUTH EVERY DAY 90 tablet 3  . PARoxetine (PAXIL) 20 MG tablet TAKE 1 TABLET BY MOUTH EVERY MORNING 90 tablet 3  . predniSONE (DELTASONE) 50 MG tablet Daily X 3 days start today, with breakfast/food 3 tablet 0  . traMADol (ULTRAM) 50 MG tablet TAKE 1 TABLET BY MOUTH FOUR TIMES DAILY 360 tablet 1   No current facility-administered medications for this visit.    PHYSICAL EXAMINATION: ECOG PERFORMANCE STATUS: 0 - Asymptomatic  Filed Vitals:   01/29/14 0955  BP: 151/58  Pulse: 65  Temp: 98 F (36.7 C)  Resp: 18   Filed Weights   01/29/14 0955  Weight: 185 lb 4.8 oz (84.052 kg)    GENERAL:alert, no distress and comfortable SKIN: skin color, texture, turgor are normal, no rashes or significant lesions EYES: normal, Conjunctiva are pink and non-injected, sclera clear OROPHARYNX:no exudate, no erythema and lips, buccal mucosa, and tongue normal  NECK: supple, thyroid normal size, non-tender, without nodularity LYMPH:  no palpable lymphadenopathy in the cervical, axillary or inguinal LUNGS: clear to auscultation and percussion with normal breathing effort HEART: regular rate & rhythm and no murmurs and no lower extremity edema ABDOMEN:abdomen soft, non-tender and normal bowel sounds Musculoskeletal:no cyanosis  of digits and no clubbing  NEURO: alert & oriented x 3 with fluent speech, no focal motor/sensory deficits  LABORATORY DATA:  I have reviewed the data as listed    Component Value Date/Time   NA 146* 01/29/2014 0946   NA 144 12/24/2013 0828   K 4.4 01/29/2014 0946   K 4.8 12/24/2013 0828   CL 106 12/24/2013 0828   CL 110* 05/28/2012 1259   CO2 27 01/29/2014 0946   CO2 25 12/24/2013 0828   GLUCOSE 115 01/29/2014 0946   GLUCOSE 97 12/24/2013 0828   GLUCOSE 202* 05/28/2012 1259   BUN 46.4* 01/29/2014 0946   BUN 47* 12/24/2013 0828   CREATININE 2.0* 01/29/2014 0946   CREATININE 2.26* 12/24/2013 0828   CREATININE 2.48* 09/11/2013 1408   CALCIUM 9.2 01/29/2014 0946   CALCIUM 9.2 12/24/2013 0828   PROT 5.8* 01/29/2014 0946   PROT 6.3 01/08/2014 0807   ALBUMIN 3.2* 01/29/2014 0946   ALBUMIN 3.5 01/08/2014 0807   AST 22 01/29/2014 0946   AST 19 01/08/2014 0807   ALT 23  01/29/2014 0946   ALT 17 01/08/2014 0807   ALKPHOS 83 01/29/2014 0946   ALKPHOS 68 01/08/2014 0807   BILITOT 0.34 01/29/2014 0946   BILITOT 0.3 01/08/2014 0807   GFRNONAA 21* 12/24/2013 0828   GFRNONAA 32* 09/08/2010 0851   GFRAA 24* 12/24/2013 0828   GFRAA 38* 09/08/2010 0851    No results found for: SPEP, UPEP  Lab Results  Component Value Date   WBC 6.9 01/29/2014   NEUTROABS 5.1 01/29/2014   HGB 11.4* 01/29/2014   HCT 36.8 01/29/2014   MCV 95.1 01/29/2014   PLT 208 01/29/2014      Chemistry      Component Value Date/Time   NA 146* 01/29/2014 0946   NA 144 12/24/2013 0828   K 4.4 01/29/2014 0946   K 4.8 12/24/2013 0828   CL 106 12/24/2013 0828   CL 110* 05/28/2012 1259   CO2 27 01/29/2014 0946   CO2 25 12/24/2013 0828   BUN 46.4* 01/29/2014 0946   BUN 47* 12/24/2013 0828   CREATININE 2.0* 01/29/2014 0946   CREATININE 2.26* 12/24/2013 0828   CREATININE 2.48* 09/11/2013 1408      Component Value Date/Time   CALCIUM 9.2 01/29/2014 0946   CALCIUM 9.2 12/24/2013 0828   ALKPHOS 83  01/29/2014 0946   ALKPHOS 68 01/08/2014 0807   AST 22 01/29/2014 0946   AST 19 01/08/2014 0807   ALT 23 01/29/2014 0946   ALT 17 01/08/2014 0807   BILITOT 0.34 01/29/2014 0946   BILITOT 0.3 01/08/2014 0807     ASSESSMENT & PLAN:  Chronic lymphocytic leukemia Overall, she tolerated treatment well. We'll continue the same treatment without dose adjustment  Anemia in neoplastic disease This is likely anemia of chronic disease. The patient denies recent history of bleeding such as epistaxis, hematuria or hematochezia. She is asymptomatic from the anemia. We will observe for now.   Chronic kidney disease, stage III (moderate) She is currently being followed closely with the nephrologist.   Diabetic neuropathy She has poorly controlled diabetes due to poor understanding and poor compliance to blood glucose monitoring. I give her insulin sliding scale and educated the patient and family members regarding the correct way of taking her insulin treatment.   Orders Placed This Encounter  Procedures  . CBC with Differential    Standing Status: Standing     Number of Occurrences: 9     Standing Expiration Date: 01/30/2015  . Comprehensive metabolic panel    Standing Status: Standing     Number of Occurrences: 9     Standing Expiration Date: 01/30/2015   All questions were answered. The patient knows to call the clinic with any problems, questions or concerns. No barriers to learning was detected. I spent 30 minutes counseling the patient face to face. The total time spent in the appointment was 40 minutes and more than 50% was on counseling and review of test results     [facility], [place], MD 01/29/2014 9:02 PM

## 2014-01-29 NOTE — Progress Notes (Signed)
1400 VSS, Gazyva rate increased to 200 mg/hr or 58 ml x 29 .

## 2014-01-29 NOTE — Assessment & Plan Note (Signed)
She has poorly controlled diabetes due to poor understanding and poor compliance to blood glucose monitoring. I give her insulin sliding scale and educated the patient and family members regarding the correct way of taking her insulin treatment.

## 2014-01-29 NOTE — Progress Notes (Signed)
Tolerating Gazyva at max rate well and continues.

## 2014-01-31 ENCOUNTER — Ambulatory Visit: Payer: Medicare Other | Admitting: Hematology and Oncology

## 2014-01-31 ENCOUNTER — Other Ambulatory Visit: Payer: Medicare Other

## 2014-02-03 ENCOUNTER — Encounter: Payer: Self-pay | Admitting: Family Medicine

## 2014-02-03 NOTE — Progress Notes (Signed)
Patient dropped DMV Form to be completed and signed by PCP. Please follow up with Patient.

## 2014-02-05 NOTE — Progress Notes (Signed)
Placed in PCP box for completion 

## 2014-02-06 NOTE — Progress Notes (Signed)
Patient ID: Christine Santos, female   DOB: 10-29-1945, 69 y.o.   MRN: [id number] Form completed

## 2014-02-12 ENCOUNTER — Telehealth: Payer: Self-pay | Admitting: *Deleted

## 2014-02-12 ENCOUNTER — Other Ambulatory Visit (HOSPITAL_BASED_OUTPATIENT_CLINIC_OR_DEPARTMENT_OTHER): Payer: Medicare Other

## 2014-02-12 ENCOUNTER — Ambulatory Visit: Payer: Medicare Other

## 2014-02-12 ENCOUNTER — Telehealth: Payer: Self-pay | Admitting: Hematology and Oncology

## 2014-02-12 ENCOUNTER — Ambulatory Visit (HOSPITAL_BASED_OUTPATIENT_CLINIC_OR_DEPARTMENT_OTHER): Payer: Medicare Other

## 2014-02-12 ENCOUNTER — Ambulatory Visit (HOSPITAL_BASED_OUTPATIENT_CLINIC_OR_DEPARTMENT_OTHER): Payer: Medicare Other | Admitting: Hematology and Oncology

## 2014-02-12 VITALS — BP 129/49 | HR 60 | Temp 97.9°F | Resp 18 | Ht 66.0 in | Wt 186.1 lb

## 2014-02-12 DIAGNOSIS — C9110 Chronic lymphocytic leukemia of B-cell type not having achieved remission: Secondary | ICD-10-CM | POA: Diagnosis not present

## 2014-02-12 DIAGNOSIS — I5022 Chronic systolic (congestive) heart failure: Secondary | ICD-10-CM

## 2014-02-12 DIAGNOSIS — D630 Anemia in neoplastic disease: Secondary | ICD-10-CM

## 2014-02-12 DIAGNOSIS — Z95828 Presence of other vascular implants and grafts: Secondary | ICD-10-CM

## 2014-02-12 DIAGNOSIS — N183 Chronic kidney disease, stage 3 unspecified: Secondary | ICD-10-CM

## 2014-02-12 DIAGNOSIS — E1140 Type 2 diabetes mellitus with diabetic neuropathy, unspecified: Secondary | ICD-10-CM

## 2014-02-12 DIAGNOSIS — Z5112 Encounter for antineoplastic immunotherapy: Secondary | ICD-10-CM | POA: Diagnosis not present

## 2014-02-12 LAB — CBC WITH DIFFERENTIAL/PLATELET
BASO%: 0.6 % (ref 0.0–2.0)
BASOS ABS: 0.1 10*3/uL (ref 0.0–0.1)
EOS ABS: 0.3 10*3/uL (ref 0.0–0.5)
EOS%: 3.3 % (ref 0.0–7.0)
HCT: 36.1 % (ref 34.8–46.6)
HGB: 11.4 g/dL — ABNORMAL LOW (ref 11.6–15.9)
LYMPH%: 17.6 % (ref 14.0–49.7)
MCH: 30 pg (ref 25.1–34.0)
MCHC: 31.6 g/dL (ref 31.5–36.0)
MCV: 95 fL (ref 79.5–101.0)
MONO#: 0.8 10*3/uL (ref 0.1–0.9)
MONO%: 10 % (ref 0.0–14.0)
NEUT#: 5.5 10*3/uL (ref 1.5–6.5)
NEUT%: 68.5 % (ref 38.4–76.8)
Platelets: 182 10*3/uL (ref 145–400)
RBC: 3.8 10*6/uL (ref 3.70–5.45)
RDW: 16 % — AB (ref 11.2–14.5)
WBC: 8.1 10*3/uL (ref 3.9–10.3)
lymph#: 1.4 10*3/uL (ref 0.9–3.3)

## 2014-02-12 LAB — COMPREHENSIVE METABOLIC PANEL (CC13)
ALBUMIN: 3.2 g/dL — AB (ref 3.5–5.0)
ALT: 21 U/L (ref 0–55)
ANION GAP: 13 meq/L — AB (ref 3–11)
AST: 25 U/L (ref 5–34)
Alkaline Phosphatase: 72 U/L (ref 40–150)
BILIRUBIN TOTAL: 0.31 mg/dL (ref 0.20–1.20)
BUN: 56.4 mg/dL — ABNORMAL HIGH (ref 7.0–26.0)
CO2: 22 mEq/L (ref 22–29)
Calcium: 9.2 mg/dL (ref 8.4–10.4)
Chloride: 107 mEq/L (ref 98–109)
Creatinine: 2.5 mg/dL — ABNORMAL HIGH (ref 0.6–1.1)
EGFR: 19 mL/min/{1.73_m2} — AB (ref >90)
GLUCOSE: 132 mg/dL (ref 70–140)
POTASSIUM: 4.4 meq/L (ref 3.5–5.1)
Sodium: 142 mEq/L (ref 136–145)
Total Protein: 6.1 g/dL — ABNORMAL LOW (ref 6.4–8.3)

## 2014-02-12 MED ORDER — SODIUM CHLORIDE 0.9 % IJ SOLN
10.0000 mL | INTRAMUSCULAR | Status: DC | PRN
  Administered 2014-02-12: 10 mL via INTRAVENOUS
  Filled 2014-02-12: qty 10

## 2014-02-12 MED ORDER — ACETAMINOPHEN 325 MG PO TABS
ORAL_TABLET | ORAL | Status: AC
  Filled 2014-02-12: qty 2

## 2014-02-12 MED ORDER — DEXAMETHASONE SODIUM PHOSPHATE 20 MG/5ML IJ SOLN
20.0000 mg | Freq: Once | INTRAMUSCULAR | Status: AC
  Administered 2014-02-12: 20 mg via INTRAVENOUS

## 2014-02-12 MED ORDER — ACETAMINOPHEN 325 MG PO TABS
650.0000 mg | ORAL_TABLET | Freq: Once | ORAL | Status: AC
  Administered 2014-02-12: 650 mg via ORAL

## 2014-02-12 MED ORDER — DEXAMETHASONE SODIUM PHOSPHATE 20 MG/5ML IJ SOLN
INTRAMUSCULAR | Status: AC
  Filled 2014-02-12: qty 5

## 2014-02-12 MED ORDER — DIPHENHYDRAMINE HCL 50 MG/ML IJ SOLN
50.0000 mg | Freq: Once | INTRAMUSCULAR | Status: AC
  Administered 2014-02-12: 50 mg via INTRAVENOUS

## 2014-02-12 MED ORDER — SODIUM CHLORIDE 0.9 % IV SOLN
1000.0000 mg | Freq: Once | INTRAVENOUS | Status: AC
  Administered 2014-02-12: 1000 mg via INTRAVENOUS
  Filled 2014-02-12: qty 40

## 2014-02-12 MED ORDER — DIPHENHYDRAMINE HCL 50 MG/ML IJ SOLN
INTRAMUSCULAR | Status: AC
  Filled 2014-02-12: qty 1

## 2014-02-12 MED ORDER — SODIUM CHLORIDE 0.9 % IV SOLN
Freq: Once | INTRAVENOUS | Status: AC
  Administered 2014-02-12: 11:00:00 via INTRAVENOUS

## 2014-02-12 MED ORDER — SODIUM CHLORIDE 0.9 % IJ SOLN
10.0000 mL | INTRAMUSCULAR | Status: DC | PRN
  Administered 2014-02-12: 10 mL
  Filled 2014-02-12: qty 10

## 2014-02-12 MED ORDER — HEPARIN SOD (PORK) LOCK FLUSH 100 UNIT/ML IV SOLN
500.0000 [IU] | Freq: Once | INTRAVENOUS | Status: AC | PRN
  Administered 2014-02-12: 500 [IU]
  Filled 2014-02-12: qty 5

## 2014-02-12 NOTE — Patient Instructions (Signed)
[facility] Discharge Instructions for Patients Receiving Chemotherapy  Today you received the following chemotherapy agents gazyva  To help prevent nausea and vomiting after your treatment, we encourage you to take your nausea medication as directed    If you develop nausea and vomiting that is not controlled by your nausea medication, call the clinic.   BELOW ARE SYMPTOMS THAT SHOULD BE REPORTED IMMEDIATELY:  *FEVER GREATER THAN 100.5 F  *CHILLS WITH OR WITHOUT FEVER  NAUSEA AND VOMITING THAT IS NOT CONTROLLED WITH YOUR NAUSEA MEDICATION  *UNUSUAL SHORTNESS OF BREATH  *UNUSUAL BRUISING OR BLEEDING  TENDERNESS IN MOUTH AND THROAT WITH OR WITHOUT PRESENCE OF ULCERS  *URINARY PROBLEMS  *BOWEL PROBLEMS  UNUSUAL RASH Items with * indicate a potential emergency and should be followed up as soon as possible.  Feel free to call the clinic you have any questions or concerns. The clinic phone number is [phone].

## 2014-02-12 NOTE — Telephone Encounter (Signed)
Pt confirmed labs/ov per 01/20 POF, gave pt AVS.... KJ, sent msg to move chemo down

## 2014-02-12 NOTE — Patient Instructions (Signed)

## 2014-02-12 NOTE — Telephone Encounter (Signed)
Per staff message and POF I have scheduled appts. Advised scheduler of appts. JMW  

## 2014-02-13 NOTE — Progress Notes (Signed)
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 She is seeing prior to cycle 2 of treatment. She tolerated treatment well. She brought with her her blood sugar monitoring journal and she appears to be doing well. She denies recent episodes of hypoglycemia.  REVIEW OF SYSTEMS:   Constitutional: Denies fevers, chills or abnormal weight loss Eyes: Denies blurriness of vision Ears, nose, mouth, throat, and face: Denies mucositis or sore throat Respiratory: Denies cough, dyspnea or wheezes Cardiovascular: Denies palpitation, chest discomfort or lower extremity swelling Gastrointestinal:  Denies nausea, heartburn or change in bowel habits Skin: Denies abnormal skin rashes Lymphatics: Denies new lymphadenopathy or easy bruising Neurological:Denies numbness, tingling or new weaknesses Behavioral/Psych: Mood is stable, no new changes  All other systems were reviewed with the patient and are negative.  I have reviewed the past medical history, past surgical  history, social history and family history with the patient and they are unchanged from previous note.  ALLERGIES:  is allergic to amoxicillin; cephalexin; and codeine phosphate.  MEDICATIONS:  Current Outpatient Prescriptions  Medication Sig Dispense Refill  . ACCU-CHEK AVIVA PLUS test strip TEST as directed three times a day 100 each 12  . acyclovir (ZOVIRAX) 400 MG tablet Take 1 tablet (400 mg total) by mouth daily. 30 tablet 5  . allopurinol (ZYLOPRIM) 300 MG tablet TAKE 1/2 TABLET BY MOUTH ONCE DAILY 45 tablet 3  . Ascorbic Acid (VITAMIN C) 100 MG tablet Take 100 mg by mouth daily.    Marland Kitchen aspirin 81 MG tablet Take 81 mg by mouth daily.    Marland Kitchen atorvastatin (LIPITOR) 80 MG tablet TAKE 1 TABLET BY MOUTH EVERY DAY 90 tablet 2  . chlorambucil (LEUKERAN) 2 MG tablet Take 4 tabs on days 1 and 15 of chemotherapy 8 tablet 5  . Choline Fenofibrate (FENOFIBRIC ACID) 45 MG CPDR TAKE 1 CAPSULE BY MOUTH DAILY 90 capsule 2  . cloNIDine (CATAPRES - DOSED IN MG/24 HR) 0.2 mg/24hr patch Place 0.2 mg onto the skin once a week. Sunday    . clopidogrel (PLAVIX) 75 MG tablet TAKE 1 TABLET BY MOUTH DAILY 90 tablet 3  . felodipine (PLENDIL) 10 MG 24 hr tablet TAKE 2 TABLETS BY MOUTH EVERY DAY 180 tablet 1  . ferrous sulfate 325 (65 FE) MG tablet Take 1 tablet (325 mg total) by mouth daily with breakfast.  3  . furosemide (LASIX) 40 MG tablet Take 40 mg by mouth 2 (two) times daily with breakfast and lunch. Taking total of 80 mg daily.    Marland Kitchen GNP GARLIC EXTRACT PO Take 1 tablet by mouth daily.      . Insulin Glargine (LANTUS SOLOSTAR) 100 UNIT/ML SOPN Inject 35  Units into the skin daily. 5 pen 12  . insulin lispro (HUMALOG) 100 UNIT/ML KiwkPen Inject 0.05 mLs (5 Units total) into the skin 3 (three) times daily before meals. 15 mL 3  . lidocaine-prilocaine (EMLA) cream Apply 1 application topically as needed. Apply to [facility] a Cath site one hour prior to needle stick. 30 g 2  . lidocaine-prilocaine (EMLA) cream Apply to  affected area once 30 g 3  . lisinopril (PRINIVIL,ZESTRIL) 40 MG tablet TAKE 1 TABLET BY MOUTH EVERY DAY 90 tablet 3  . Melatonin 5 MG CAPS Take 10 mg by mouth at bedtime.     . metoprolol tartrate (LOPRESSOR) 25 MG tablet TAKE 1/2 TABLET BY MOUTH TWICE DAILY 90 tablet 3  . Multiple Vitamins-Minerals (MULTIVITAMINS THER. W/MINERALS) TABS Take 1 tablet by mouth daily.      . Omega-3 Fatty Acids (FISH OIL) 1000 MG CAPS Take 2,000 mg by mouth 2 (two) times daily.     Marland Kitchen OVER THE COUNTER MEDICATION Take 1 tablet by mouth 2 (two) times daily. Macular Degenerative Eye Supplement    . pantoprazole (PROTONIX) 40 MG tablet TAKE 1 TABLET BY MOUTH EVERY DAY 90 tablet 3  . PARoxetine (PAXIL) 20 MG tablet TAKE 1 TABLET BY MOUTH EVERY MORNING 90 tablet 3  . predniSONE (DELTASONE) 50 MG tablet Daily X 3 days start today, with breakfast/food 3 tablet 0  . traMADol (ULTRAM) 50 MG tablet TAKE 1 TABLET BY MOUTH FOUR TIMES DAILY 360 tablet 1   No current facility-administered medications for this visit.    PHYSICAL EXAMINATION: ECOG PERFORMANCE STATUS: 0 - Asymptomatic  Filed Vitals:   02/12/14 1011  BP: 129/49  Pulse: 60  Temp: 97.9 F (36.6 C)  Resp: 18   Filed Weights   02/12/14 1011  Weight: 186 lb 1.6 oz (84.414 kg)    GENERAL:alert, no distress and comfortable SKIN: skin color, texture, turgor are normal, no rashes or significant lesions EYES: normal, Conjunctiva are pink and non-injected, sclera clear OROPHARYNX:no exudate, no erythema and lips, buccal mucosa, and tongue normal  NECK: supple, thyroid normal size, non-tender, without nodularity LYMPH:  no palpable lymphadenopathy in the cervical, axillary or inguinal LUNGS: clear to auscultation and percussion with normal breathing effort HEART: regular rate & rhythm and no murmurs and no lower extremity edema ABDOMEN:abdomen soft, non-tender and normal bowel sounds Musculoskeletal:no cyanosis of digits and no clubbing  NEURO: alert &  oriented x 3 with fluent speech, no focal motor/sensory deficits  LABORATORY DATA:  I have reviewed the data as listed    Component Value Date/Time   NA 142 02/12/2014 0940   NA 144 12/24/2013 0828   K 4.4 02/12/2014 0940   K 4.8 12/24/2013 0828   CL 106 12/24/2013 0828   CL 110* 05/28/2012 1259   CO2 22 02/12/2014 0940   CO2 25 12/24/2013 0828   GLUCOSE 132 02/12/2014 0940   GLUCOSE 97 12/24/2013 0828   GLUCOSE 202* 05/28/2012 1259   BUN 56.4* 02/12/2014 0940   BUN 47* 12/24/2013 0828   CREATININE 2.5* 02/12/2014 0940   CREATININE 2.26* 12/24/2013 0828   CREATININE 2.48* 09/11/2013 1408   CALCIUM 9.2 02/12/2014 0940   CALCIUM 9.2 12/24/2013 0828   PROT 6.1* 02/12/2014 0940   PROT 6.3 01/08/2014 0807   ALBUMIN 3.2* 02/12/2014 0940   ALBUMIN 3.5 01/08/2014 0807   AST 25 02/12/2014 0940   AST 19 01/08/2014 0807   ALT 21 02/12/2014 0940   ALT 17 01/08/2014 [id number]  ALKPHOS 72 02/12/2014 0940   ALKPHOS 68 01/08/2014 0807   BILITOT 0.31 02/12/2014 0940   BILITOT 0.3 01/08/2014 0807   GFRNONAA 21* 12/24/2013 0828   GFRNONAA 32* 09/08/2010 0851   GFRAA 24* 12/24/2013 0828   GFRAA 38* 09/08/2010 0851    No results found for: SPEP, UPEP  Lab Results  Component Value Date   WBC 8.1 02/12/2014   NEUTROABS 5.5 02/12/2014   HGB 11.4* 02/12/2014   HCT 36.1 02/12/2014   MCV 95.0 02/12/2014   PLT 182 02/12/2014      Chemistry      Component Value Date/Time   NA 142 02/12/2014 0940   NA 144 12/24/2013 0828   K 4.4 02/12/2014 0940   K 4.8 12/24/2013 0828   CL 106 12/24/2013 0828   CL 110* 05/28/2012 1259   CO2 22 02/12/2014 0940   CO2 25 12/24/2013 0828   BUN 56.4* 02/12/2014 0940   BUN 47* 12/24/2013 0828   CREATININE 2.5* 02/12/2014 0940   CREATININE 2.26* 12/24/2013 0828   CREATININE 2.48* 09/11/2013 1408      Component Value Date/Time   CALCIUM 9.2 02/12/2014 0940   CALCIUM 9.2 12/24/2013 0828   ALKPHOS 72 02/12/2014 0940   ALKPHOS 68 01/08/2014 0807   AST  25 02/12/2014 0940   AST 19 01/08/2014 0807   ALT 21 02/12/2014 0940   ALT 17 01/08/2014 0807   BILITOT 0.31 02/12/2014 0940   BILITOT 0.3 01/08/2014 0807      ASSESSMENT & PLAN:  Chronic lymphocytic leukemia Overall, she tolerated treatment well. We'll continue the same treatment without dose adjustment   Anemia in neoplastic disease This is likely anemia of chronic disease. The patient denies recent history of bleeding such as epistaxis, hematuria or hematochezia. She is asymptomatic from the anemia. We will observe for now.   Chronic kidney disease, stage III (moderate) She is currently being followed closely with the nephrologist. I notice that her renal function is a little worse. I recommend reduce Lasix to only once a day.      Chronic systolic heart failure  She is currently taking multiple medications. Clinically, she has no signs of congestive heart failure. I recommend reducing Lasix to once a day due to mild worsening renal failure.   Diabetic neuropathy Her diabetes control has improved recently. I congratulated her affect during recommend continue close blood sugar monitoring.     No orders of the defined types were placed in this encounter.   All questions were answered. The patient knows to call the clinic with any problems, questions or concerns. No barriers to learning was detected. I spent 25 minutes counseling the patient face to face. The total time spent in the appointment was 30 minutes and more than 50% was on counseling and review of test results     [facility], Tekisha Darcey, MD 02/13/2014 8:10 AM

## 2014-02-13 NOTE — Assessment & Plan Note (Signed)
She is currently being followed closely with the nephrologist. I notice that her renal function is a little worse. I recommend reduce Lasix to only once a day.

## 2014-02-13 NOTE — Assessment & Plan Note (Signed)
Overall, she tolerated treatment well. We'll continue the same treatment without dose adjustment

## 2014-02-13 NOTE — Assessment & Plan Note (Signed)
She is currently taking multiple medications. Clinically, she has no signs of congestive heart failure. I recommend reducing Lasix to once a day due to mild worsening renal failure.

## 2014-02-13 NOTE — Assessment & Plan Note (Signed)
Her diabetes control has improved recently. I congratulated her affect during recommend continue close blood sugar monitoring.

## 2014-02-13 NOTE — Assessment & Plan Note (Signed)
This is likely anemia of chronic disease. The patient denies recent history of bleeding such as epistaxis, hematuria or hematochezia. She is asymptomatic from the anemia. We will observe for now.  

## 2014-02-20 ENCOUNTER — Other Ambulatory Visit: Payer: Self-pay | Admitting: Family Medicine

## 2014-02-26 ENCOUNTER — Ambulatory Visit (INDEPENDENT_AMBULATORY_CARE_PROVIDER_SITE_OTHER): Payer: Medicare Other | Admitting: Family Medicine

## 2014-02-26 ENCOUNTER — Encounter: Payer: Self-pay | Admitting: Family Medicine

## 2014-02-26 VITALS — BP 148/62 | HR 55 | Temp 97.1°F | Wt 179.0 lb

## 2014-02-26 DIAGNOSIS — N183 Chronic kidney disease, stage 3 unspecified: Secondary | ICD-10-CM

## 2014-02-26 DIAGNOSIS — E1140 Type 2 diabetes mellitus with diabetic neuropathy, unspecified: Secondary | ICD-10-CM

## 2014-02-26 DIAGNOSIS — E118 Type 2 diabetes mellitus with unspecified complications: Secondary | ICD-10-CM

## 2014-02-26 DIAGNOSIS — I10 Essential (primary) hypertension: Secondary | ICD-10-CM

## 2014-02-26 DIAGNOSIS — E1149 Type 2 diabetes mellitus with other diabetic neurological complication: Secondary | ICD-10-CM

## 2014-02-26 LAB — POCT GLYCOSYLATED HEMOGLOBIN (HGB A1C): Hemoglobin A1C: 6.8

## 2014-02-26 NOTE — Assessment & Plan Note (Signed)
Borderline

## 2014-02-26 NOTE — Assessment & Plan Note (Signed)
A1C at goal

## 2014-02-26 NOTE — Assessment & Plan Note (Signed)
Stable, cont to monitor

## 2014-02-26 NOTE — Progress Notes (Signed)
   Subjective:    Patient ID: Christine Santos, female    DOB: 1945/05/13, 69 y.o.   MRN: [id number]  HPIRecheck mult problems HBP is borderline today and in review of previous readings DM control is good Renal function is stable based on labs by onc. She has responded well to chemo and WBC is now normal She has been working hard on diet and exercise and has lost weight.    Review of Systems     Objective:   Physical Exam VS noted Lungs clear Cardiac RRR without m or g Abd benign.       Assessment & Plan:

## 2014-02-26 NOTE — Patient Instructions (Signed)
Your diabetes is very good despite the chemo.  You are 6.8 today and the goal is below seven Your blood pressure is borderline.  I want the top number below 150.  If it is consistently above that, I will increase your medication. We need to keep an eye on your kidneys.  The kidney blood test should be at least once every 3 months.  It sounds like your oncologist is on top of that. Your weight is good.  Keep up the good work.   See me in three months. You are due for an eye exam.  Please make sure I get a copy of the report.

## 2014-03-12 ENCOUNTER — Ambulatory Visit: Payer: Medicare Other

## 2014-03-12 ENCOUNTER — Ambulatory Visit (HOSPITAL_BASED_OUTPATIENT_CLINIC_OR_DEPARTMENT_OTHER): Payer: Medicare Other

## 2014-03-12 ENCOUNTER — Other Ambulatory Visit (HOSPITAL_BASED_OUTPATIENT_CLINIC_OR_DEPARTMENT_OTHER): Payer: Medicare Other

## 2014-03-12 ENCOUNTER — Encounter: Payer: Self-pay | Admitting: Hematology and Oncology

## 2014-03-12 ENCOUNTER — Ambulatory Visit (HOSPITAL_BASED_OUTPATIENT_CLINIC_OR_DEPARTMENT_OTHER): Payer: Medicare Other | Admitting: Hematology and Oncology

## 2014-03-12 ENCOUNTER — Telehealth: Payer: Self-pay | Admitting: Hematology and Oncology

## 2014-03-12 VITALS — BP 136/62 | HR 56 | Temp 97.6°F | Resp 18 | Ht 66.0 in | Wt 186.0 lb

## 2014-03-12 VITALS — BP 136/62 | HR 56 | Temp 97.6°F | Wt 186.0 lb

## 2014-03-12 DIAGNOSIS — I5022 Chronic systolic (congestive) heart failure: Secondary | ICD-10-CM

## 2014-03-12 DIAGNOSIS — D630 Anemia in neoplastic disease: Secondary | ICD-10-CM

## 2014-03-12 DIAGNOSIS — N183 Chronic kidney disease, stage 3 unspecified: Secondary | ICD-10-CM

## 2014-03-12 DIAGNOSIS — Z5112 Encounter for antineoplastic immunotherapy: Secondary | ICD-10-CM

## 2014-03-12 DIAGNOSIS — E1140 Type 2 diabetes mellitus with diabetic neuropathy, unspecified: Secondary | ICD-10-CM

## 2014-03-12 DIAGNOSIS — C9110 Chronic lymphocytic leukemia of B-cell type not having achieved remission: Secondary | ICD-10-CM

## 2014-03-12 DIAGNOSIS — Z95828 Presence of other vascular implants and grafts: Secondary | ICD-10-CM

## 2014-03-12 LAB — COMPREHENSIVE METABOLIC PANEL (CC13)
ALK PHOS: 65 U/L (ref 40–150)
ALT: 19 U/L (ref 0–55)
AST: 30 U/L (ref 5–34)
Albumin: 3.3 g/dL — ABNORMAL LOW (ref 3.5–5.0)
Anion Gap: 9 mEq/L (ref 3–11)
BUN: 51.1 mg/dL — ABNORMAL HIGH (ref 7.0–26.0)
CO2: 25 mEq/L (ref 22–29)
Calcium: 9.5 mg/dL (ref 8.4–10.4)
Chloride: 108 mEq/L (ref 98–109)
Creatinine: 2.1 mg/dL — ABNORMAL HIGH (ref 0.6–1.1)
EGFR: 23 mL/min/{1.73_m2} — ABNORMAL LOW (ref >90)
Glucose: 220 mg/dl — ABNORMAL HIGH (ref 70–140)
Potassium: 4.5 mEq/L (ref 3.5–5.1)
SODIUM: 142 meq/L (ref 136–145)
TOTAL PROTEIN: 5.8 g/dL — AB (ref 6.4–8.3)
Total Bilirubin: 0.27 mg/dL (ref 0.20–1.20)

## 2014-03-12 LAB — CBC WITH DIFFERENTIAL/PLATELET
BASO%: 0.6 % (ref 0.0–2.0)
Basophils Absolute: 0 10*3/uL (ref 0.0–0.1)
EOS%: 6.5 % (ref 0.0–7.0)
Eosinophils Absolute: 0.3 10*3/uL (ref 0.0–0.5)
HCT: 37 % (ref 34.8–46.6)
HGB: 11.6 g/dL (ref 11.6–15.9)
LYMPH#: 1.2 10*3/uL (ref 0.9–3.3)
LYMPH%: 24 % (ref 14.0–49.7)
MCH: 29.9 pg (ref 25.1–34.0)
MCHC: 31.4 g/dL — AB (ref 31.5–36.0)
MCV: 95.4 fL (ref 79.5–101.0)
MONO#: 0.8 10*3/uL (ref 0.1–0.9)
MONO%: 15.6 % — ABNORMAL HIGH (ref 0.0–14.0)
NEUT#: 2.6 10*3/uL (ref 1.5–6.5)
NEUT%: 53.3 % (ref 38.4–76.8)
Platelets: 178 10*3/uL (ref 145–400)
RBC: 3.88 10*6/uL (ref 3.70–5.45)
RDW: 16 % — AB (ref 11.2–14.5)
WBC: 5 10*3/uL (ref 3.9–10.3)

## 2014-03-12 MED ORDER — SODIUM CHLORIDE 0.9 % IJ SOLN
10.0000 mL | INTRAMUSCULAR | Status: DC | PRN
  Administered 2014-03-12: 10 mL
  Filled 2014-03-12: qty 10

## 2014-03-12 MED ORDER — SODIUM CHLORIDE 0.9 % IV SOLN
Freq: Once | INTRAVENOUS | Status: AC
  Administered 2014-03-12: 11:00:00 via INTRAVENOUS

## 2014-03-12 MED ORDER — DIPHENHYDRAMINE HCL 50 MG/ML IJ SOLN
50.0000 mg | Freq: Once | INTRAMUSCULAR | Status: AC
  Administered 2014-03-12: 50 mg via INTRAVENOUS

## 2014-03-12 MED ORDER — DEXAMETHASONE SODIUM PHOSPHATE 20 MG/5ML IJ SOLN
INTRAMUSCULAR | Status: AC
  Filled 2014-03-12: qty 5

## 2014-03-12 MED ORDER — DIPHENHYDRAMINE HCL 50 MG/ML IJ SOLN
INTRAMUSCULAR | Status: AC
  Filled 2014-03-12: qty 1

## 2014-03-12 MED ORDER — SODIUM CHLORIDE 0.9 % IJ SOLN
10.0000 mL | INTRAMUSCULAR | Status: DC | PRN
  Administered 2014-03-12: 10 mL via INTRAVENOUS
  Filled 2014-03-12: qty 10

## 2014-03-12 MED ORDER — ACETAMINOPHEN 325 MG PO TABS
ORAL_TABLET | ORAL | Status: AC
  Filled 2014-03-12: qty 2

## 2014-03-12 MED ORDER — SODIUM CHLORIDE 0.9 % IV SOLN
1000.0000 mg | Freq: Once | INTRAVENOUS | Status: AC
  Administered 2014-03-12: 1000 mg via INTRAVENOUS
  Filled 2014-03-12: qty 40

## 2014-03-12 MED ORDER — ACETAMINOPHEN 325 MG PO TABS
650.0000 mg | ORAL_TABLET | Freq: Once | ORAL | Status: AC
  Administered 2014-03-12: 650 mg via ORAL

## 2014-03-12 MED ORDER — HEPARIN SOD (PORK) LOCK FLUSH 100 UNIT/ML IV SOLN
500.0000 [IU] | Freq: Once | INTRAVENOUS | Status: AC | PRN
  Administered 2014-03-12: 500 [IU]
  Filled 2014-03-12: qty 5

## 2014-03-12 MED ORDER — DEXAMETHASONE SODIUM PHOSPHATE 20 MG/5ML IJ SOLN
20.0000 mg | Freq: Once | INTRAMUSCULAR | Status: AC
  Administered 2014-03-12: 20 mg via INTRAVENOUS

## 2014-03-12 NOTE — Progress Notes (Signed)
[place] OFFICE PROGRESS NOTE  Patient Care Team: Zigmund Gottron, MD as PCP - General Johna Sheriff, MD (Ophthalmology) [name], MD as Consulting Physician (Cardiology) Hurman Horn, MD (Ophthalmology) Heath Lark, MD as Consulting Physician (Hematology and Oncology) Estanislado Emms, MD as Consulting Physician (Nephrology)  SUMMARY OF ONCOLOGIC HISTORY:   Chronic lymphocytic leukemia   10/13/2006 Initial Diagnosis Chronic lymphocytic leukemia   12/24/2013 Procedure Kidney biopsy showed renal involvement of CLL, with associated diabetic gromerulosclerosis, moderate to severe arterionephrosclerosis   01/10/2014 Imaging PET/CT scan showed no evidence of metastatic disease   01/10/2014 Procedure She has placement of port   01/14/2014 Pathology Results FISH from peripheral blood showed deletion 13 q   01/15/2014 -  Chemotherapy She received Obinutuzumab along with chlorambucil    INTERVAL HISTORY: Please see below for problem oriented charting.  she is seen prior to cycle 4 of treatment. She tolerated recent treatment well. Denies any chest pain or shortness breath. Denies leg edema. Her blood sugar is under excellent control.  REVIEW OF SYSTEMS:   Constitutional: Denies fevers, chills or abnormal weight loss Eyes: Denies blurriness of vision Ears, nose, mouth, throat, and face: Denies mucositis or sore throat Respiratory: Denies cough, dyspnea or wheezes Cardiovascular: Denies palpitation, chest discomfort or lower extremity swelling Gastrointestinal:  Denies nausea, heartburn or change in bowel habits Skin: Denies abnormal skin rashes Lymphatics: Denies new lymphadenopathy or easy bruising Neurological:Denies numbness, tingling or new weaknesses Behavioral/Psych: Mood is stable, no new changes  All other systems were reviewed with the patient and are negative.  I have reviewed the past medical history, past surgical history, social history  and family history with the patient and they are unchanged from previous note.  ALLERGIES:  is allergic to amoxicillin; cephalexin; and codeine phosphate.  MEDICATIONS:  Current Outpatient Prescriptions  Medication Sig Dispense Refill  . ACCU-CHEK AVIVA PLUS test strip TEST as directed three times a day 100 each 12  . acyclovir (ZOVIRAX) 400 MG tablet Take 1 tablet (400 mg total) by mouth daily. 30 tablet 5  . allopurinol (ZYLOPRIM) 300 MG tablet TAKE 1/2 TABLET BY MOUTH ONCE DAILY 45 tablet 3  . Ascorbic Acid (VITAMIN C) 100 MG tablet Take 100 mg by mouth daily.    Marland Kitchen aspirin 81 MG tablet Take 81 mg by mouth daily.    Marland Kitchen atorvastatin (LIPITOR) 80 MG tablet TAKE 1 TABLET BY MOUTH EVERY DAY 90 tablet 2  . Choline Fenofibrate (FENOFIBRIC ACID) 45 MG CPDR TAKE 1 CAPSULE BY MOUTH DAILY 90 capsule 2  . cloNIDine (CATAPRES - DOSED IN MG/24 HR) 0.2 mg/24hr patch Place 0.2 mg onto the skin once a week. Sunday    . clopidogrel (PLAVIX) 75 MG tablet TAKE 1 TABLET BY MOUTH DAILY 90 tablet 3  . felodipine (PLENDIL) 10 MG 24 hr tablet TAKE 2 TABLETS BY MOUTH EVERY DAY 180 tablet 1  . ferrous sulfate 325 (65 FE) MG tablet Take 1 tablet (325 mg total) by mouth daily with breakfast.  3  . furosemide (LASIX) 40 MG tablet Take 40 mg by mouth 2 (two) times daily with breakfast and lunch. Taking total of 80 mg daily.    Marland Kitchen GNP GARLIC EXTRACT PO Take 1 tablet by mouth daily.      . insulin lispro (HUMALOG) 100 UNIT/ML KiwkPen Inject 0.05 mLs (5 Units total) into the skin 3 (three) times daily before meals. 15 mL 3  . LANTUS SOLOSTAR 100 UNIT/ML Solostar Pen  INJECT 35 UNITS UNDER THE SKIN DAILY 15 mL 6  . lidocaine-prilocaine (EMLA) cream Apply 1 application topically as needed. Apply to [facility] a Cath site one hour prior to needle stick. 30 g 2  . lidocaine-prilocaine (EMLA) cream Apply to affected area once 30 g 3  . lisinopril (PRINIVIL,ZESTRIL) 40 MG tablet TAKE 1 TABLET BY MOUTH EVERY DAY 90 tablet 3  . Melatonin  10 MG CAPS Take 10 mg by mouth at bedtime as needed.    . metoprolol tartrate (LOPRESSOR) 25 MG tablet TAKE 1/2 TABLET BY MOUTH TWICE DAILY 90 tablet 3  . Multiple Vitamins-Minerals (MULTIVITAMINS THER. W/MINERALS) TABS Take 1 tablet by mouth daily.      . Omega-3 Fatty Acids (FISH OIL) 1000 MG CAPS Take 2,000 mg by mouth 2 (two) times daily.     Marland Kitchen OVER THE COUNTER MEDICATION Take 1 tablet by mouth 2 (two) times daily. Macular Degenerative Eye Supplement    . pantoprazole (PROTONIX) 40 MG tablet TAKE 1 TABLET BY MOUTH EVERY DAY 90 tablet 3  . PARoxetine (PAXIL) 20 MG tablet TAKE 1 TABLET BY MOUTH EVERY MORNING 90 tablet 3  . predniSONE (DELTASONE) 50 MG tablet Daily X 3 days start today, with breakfast/food 3 tablet 0  . traMADol (ULTRAM) 50 MG tablet TAKE 1 TABLET BY MOUTH FOUR TIMES DAILY 360 tablet 1   No current facility-administered medications for this visit.    PHYSICAL EXAMINATION: ECOG PERFORMANCE STATUS: 0 - Asymptomatic  Filed Vitals:   03/12/14 1003  BP: 136/62  Pulse: 56  Temp: 97.6 F (36.4 C)  Resp: 18   Filed Weights   03/12/14 1003  Weight: 186 lb (84.369 kg)    GENERAL:alert, no distress and comfortable SKIN: skin color, texture, turgor are normal, no rashes or significant lesions EYES: normal, Conjunctiva are pink and non-injected, sclera clear OROPHARYNX:no exudate, no erythema and lips, buccal mucosa, and tongue normal  NECK: supple, thyroid normal size, non-tender, without nodularity LYMPH:  no palpable lymphadenopathy in the cervical, axillary or inguinal LUNGS: clear to auscultation and percussion with normal breathing effort HEART: regular rate & rhythm and no murmurs and no lower extremity edema ABDOMEN:abdomen soft, non-tender and normal bowel sounds Musculoskeletal:no cyanosis of digits and no clubbing  NEURO: alert & oriented x 3 with fluent speech, no focal motor/sensory deficits  LABORATORY DATA:  I have reviewed the data as listed     Component Value Date/Time   NA 142 03/12/2014 0927   NA 144 12/24/2013 0828   K 4.5 03/12/2014 0927   K 4.8 12/24/2013 0828   CL 106 12/24/2013 0828   CL 110* 05/28/2012 1259   CO2 25 03/12/2014 0927   CO2 25 12/24/2013 0828   GLUCOSE 220* 03/12/2014 0927   GLUCOSE 97 12/24/2013 0828   GLUCOSE 202* 05/28/2012 1259   BUN 51.1* 03/12/2014 0927   BUN 47* 12/24/2013 0828   CREATININE 2.1* 03/12/2014 0927   CREATININE 2.26* 12/24/2013 0828   CREATININE 2.48* 09/11/2013 1408   CALCIUM 9.5 03/12/2014 0927   CALCIUM 9.2 12/24/2013 0828   PROT 5.8* 03/12/2014 0927   PROT 6.3 01/08/2014 0807   ALBUMIN 3.3* 03/12/2014 0927   ALBUMIN 3.5 01/08/2014 0807   AST 30 03/12/2014 0927   AST 19 01/08/2014 0807   ALT 19 03/12/2014 0927   ALT 17 01/08/2014 0807   ALKPHOS 65 03/12/2014 0927   ALKPHOS 68 01/08/2014 0807   BILITOT 0.27 03/12/2014 0927   BILITOT 0.3 01/08/2014 [id number]  GFRNONAA 21* 12/24/2013 0828   GFRNONAA 32* 09/08/2010 0851   GFRAA 24* 12/24/2013 0828   GFRAA 38* 09/08/2010 0851    No results found for: SPEP, UPEP  Lab Results  Component Value Date   WBC 5.0 03/12/2014   NEUTROABS 2.6 03/12/2014   HGB 11.6 03/12/2014   HCT 37.0 03/12/2014   MCV 95.4 03/12/2014   PLT 178 03/12/2014      Chemistry      Component Value Date/Time   NA 142 03/12/2014 0927   NA 144 12/24/2013 0828   K 4.5 03/12/2014 0927   K 4.8 12/24/2013 0828   CL 106 12/24/2013 0828   CL 110* 05/28/2012 1259   CO2 25 03/12/2014 0927   CO2 25 12/24/2013 0828   BUN 51.1* 03/12/2014 0927   BUN 47* 12/24/2013 0828   CREATININE 2.1* 03/12/2014 0927   CREATININE 2.26* 12/24/2013 0828   CREATININE 2.48* 09/11/2013 1408      Component Value Date/Time   CALCIUM 9.5 03/12/2014 0927   CALCIUM 9.2 12/24/2013 0828   ALKPHOS 65 03/12/2014 0927   ALKPHOS 68 01/08/2014 0807   AST 30 03/12/2014 0927   AST 19 01/08/2014 0807   ALT 19 03/12/2014 0927   ALT 17 01/08/2014 0807   BILITOT 0.27 03/12/2014  0927   BILITOT 0.3 01/08/2014 0807      ASSESSMENT & PLAN:  Chronic lymphocytic leukemia Overall, she tolerated treatment well. We'll continue the same treatment without dose adjustment  I plan to order a PET CT scan to restage her before cycle 4 and she agreed to proceed. I will discontinue chlorambucil from now on.     Chronic kidney disease, stage III (moderate) She is currently being followed closely with the nephrologist. Her renal function is stable.        Chronic systolic heart failure  She is currently taking multiple medications. Clinically, she has no signs of congestive heart failure. I recommend reducing Lasix to once a day due to recent mild worsening renal failure.   Diabetic neuropathy Her diabetes control has improved recently. I congratulated her affect during recommend continue close blood sugar monitoring.    Orders Placed This Encounter  Procedures  . NM PET Image Restag (PS) Skull Base To Thigh    Standing Status: Future     Number of Occurrences:      Standing Expiration Date: 05/12/2015    Order Specific Question:  Reason for Exam (SYMPTOM  OR DIAGNOSIS REQUIRED)    Answer:  staging CLL, assess response to Rx    Order Specific Question:  Preferred imaging location?    Answer:  [facility]   All questions were answered. The patient knows to call the clinic with any problems, questions or concerns. No barriers to learning was detected. I spent 25 minutes counseling the patient face to face. The total time spent in the appointment was 30 minutes and more than 50% was on counseling and review of test results     [facility], [place], MD 03/12/2014 10:51 AM

## 2014-03-12 NOTE — Patient Instructions (Signed)
Obinutuzumab injection What is this medicine? OBINUTUZUMAB (OH bi nue TOOZ ue mab) is a monoclonal antibody. It is used to treat chronic lymphocytic leukemia. This drug targets a specific protein on cancer cells and stops them from growing. This medicine may be used for other purposes; ask your health care provider or pharmacist if you have questions. COMMON BRAND NAME(S): GAZYVA What should I tell my health care provider before I take this medicine? They need to know if you have any of these conditions: -hepatitis -low blood counts, like low white cell, platelet, or red cell counts -lung or breathing disease -heart disease -an unusual or allergic reaction to ofatumumab, other medicines, foods, dyes, or preservatives -pregnant or trying to get pregnant -breast-feeding How should I use this medicine? This medicine is for infusion into a vein. It is given by a health care professional in a hospital or clinic setting. Talk to your pediatrician regarding the use of this medicine in children. Special care may be needed. Overdosage: If you think you've taken too much of this medicine contact a poison control center or emergency room at once. Overdosage: If you think you have taken too much of this medicine contact a poison control center or emergency room at once. NOTE: This medicine is only for you. Do not share this medicine with others. What if I miss a dose? Keep appointments for follow-up doses as directed. It is important not to miss your dose. Call your doctor or health care professional if you are unable to keep an appointment. What may interact with this medicine? -live virus vaccines This list may not describe all possible interactions. Give your health care provider a list of all the medicines, herbs, non-prescription drugs, or dietary supplements you use. Also tell them if you smoke, drink alcohol, or use illegal drugs. Some items may interact with your medicine. What should I watch  for while using this medicine? Report any side effects that you notice during your treatment right away, such as changes in your breathing, fever, chills, dizziness or lightheadedness. These effects are more common with the first dose. Visit your prescriber or health care professional for checks on your progress. You will need to have regular blood work. Report any other side effects. The side effects of this medicine can continue after you finish your treatment. Continue your course of treatment even though you feel ill unless your doctor tells you to stop. Call your doctor or health care professional for advice if you get a fever, chills or sore throat, or other symptoms of a cold or flu. Do not treat yourself. This drug decreases your body's ability to fight infections. Try to avoid being around people who are sick. This medicine may increase your risk to bruise or bleed. Call your doctor or health care professional if you notice any unusual bleeding. Be careful brushing and flossing your teeth or using a toothpick because you may get an infection or bleed more easily. If you have any dental work done, tell your dentist you are receiving this medicine. Avoid taking products that contain aspirin, acetaminophen, ibuprofen, naproxen, or ketoprofen unless instructed by your doctor. These medicines may hide a fever. Do not become pregnant while taking this medicine. Women should inform their doctor if they wish to become pregnant or think they might be pregnant. There is a potential for serious side effects to an unborn child. Talk to your health care professional or pharmacist for more information. Do not breast-feed an infant while taking this   medicine. What side effects may I notice from receiving this medicine? Side effects that you should report to your doctor or health care professional as soon as possible: -allergic reactions like skin rash, itching or hives, swelling of the face, lips, or  tongue -breathing problems -changes in vision -chest pain or chest tightness -chills -confusion, trouble speaking or understanding -cough -diarrhea -dizziness -fainting spells -fever -general ill feeling or flu-like symptoms -lightheadedness -loss of balance or coordination -nausea, vomiting -right upper belly pain -trouble walking -unusual bleeding or bruising -unusually weak or tired -yellowing of the eyes or skin Side effects that usually do not require medical attention (Report these to your doctor or health care professional if they continue or are bothersome.): -muscle pain -muscle cramps This list may not describe all possible side effects. Call your doctor for medical advice about side effects. You may report side effects to FDA at 1-800-FDA-1088. Where should I keep my medicine? This drug is only given in a hospital or clinic and will not be stored at home. NOTE: This sheet is a summary. It may not cover all possible information. If you have questions about this medicine, talk to your doctor, pharmacist, or health care provider.  2015, Elsevier/Gold Standard. (2011-12-01 18:24:29)  

## 2014-03-12 NOTE — Assessment & Plan Note (Signed)
Overall, she tolerated treatment well. We'll continue the same treatment without dose adjustment  I plan to order a PET CT scan to restage her before cycle 4 and she agreed to proceed. I will discontinue chlorambucil from now on.

## 2014-03-12 NOTE — Patient Instructions (Signed)

## 2014-03-12 NOTE — Telephone Encounter (Signed)
gv and printed appt sched anda vs for pt for March.... °

## 2014-03-12 NOTE — Assessment & Plan Note (Signed)
She is currently taking multiple medications. Clinically, she has no signs of congestive heart failure. I recommend reducing Lasix to once a day due to recent mild worsening renal failure.

## 2014-03-12 NOTE — Assessment & Plan Note (Signed)
She is currently being followed closely with the nephrologist. Her renal function is stable.

## 2014-03-12 NOTE — Assessment & Plan Note (Signed)
Her diabetes control has improved recently. I congratulated her affect during recommend continue close blood sugar monitoring.

## 2014-03-13 ENCOUNTER — Encounter: Payer: Self-pay | Admitting: Hematology and Oncology

## 2014-03-13 NOTE — Progress Notes (Unsigned)
Pt is approved thru [employer] for Gazyva from 03/12/14 to 03/12/15 or when the benefit cap has been met.  Expenses can be submitted for reimbursement for dos 12/12/13 to 03/12/15.  The amount of grant is $11,000.  Forwarded copy of letter to [facility] in billing.

## 2014-03-19 ENCOUNTER — Other Ambulatory Visit: Payer: Self-pay | Admitting: Interventional Cardiology

## 2014-03-19 ENCOUNTER — Other Ambulatory Visit: Payer: Self-pay | Admitting: Family Medicine

## 2014-03-19 DIAGNOSIS — M158 Other polyosteoarthritis: Secondary | ICD-10-CM

## 2014-03-20 NOTE — Assessment & Plan Note (Signed)
Refilled after e request

## 2014-03-25 ENCOUNTER — Ambulatory Visit (INDEPENDENT_AMBULATORY_CARE_PROVIDER_SITE_OTHER): Payer: Medicare Other | Admitting: Cardiovascular Disease

## 2014-03-25 ENCOUNTER — Encounter: Payer: Self-pay | Admitting: Cardiovascular Disease

## 2014-03-25 VITALS — BP 100/52 | HR 54 | Ht 66.0 in | Wt 185.6 lb

## 2014-03-25 DIAGNOSIS — I5022 Chronic systolic (congestive) heart failure: Secondary | ICD-10-CM

## 2014-03-25 DIAGNOSIS — I739 Peripheral vascular disease, unspecified: Secondary | ICD-10-CM

## 2014-03-25 DIAGNOSIS — I10 Essential (primary) hypertension: Secondary | ICD-10-CM

## 2014-03-25 NOTE — Progress Notes (Signed)
Date:  03/25/2014   ID:  Christine Santos, DOB 1945-02-08, MRN [id number]  PCP:  Zigmund Gottron, MD  Primary cardiologist: Dr. Irish Lack    History of Present Illness: Christine Santos is a 69 y.o. female who is here today for a follow up visit regarding PAD. She has known history of CAD s/p CABG in 1999. She has multiple chronic medical conditions that include diabetes mellitus, hypertension,  chronic kidney disease with most recent creatinine of 2, CLL  and hyperlipidemia. She has known history of peripheral arterial disease with bilateral lower extremity revascularization. Most recently the right SFA was treated with atherectomy and PTA in December 2014. Previous RSFA stent was patent. Previous LSFA atherectomy site was patent. She reports mild bilateral calf discomfort after walking about 2 blocks. She is more bothered by foot pain both at rest and with activities . she used to get more cramps in her legs at night. She reports improved symptoms over all sense the most recent procedure. She is not a smoker.  Recent noninvasive evaluation with normal ABI on right and noncompressible on left.  Duplex showed significant left SFA restenosis. She was treated medically given the lack of significant claudication. She continues to deny claudication on the left side. She is currently getting chemotherapy for CLL.     Wt Readings from Last 3 Encounters:  03/12/14 186 lb (84.369 kg)  03/12/14 186 lb (84.369 kg)  02/26/14 179 lb (81.194 kg)     Past Medical History  Diagnosis Date  . CLL (chronic lymphoblastic leukemia)   . Hyperlipidemia   . Hypertension   . Diastolic heart failure   . CAD (coronary artery disease)     s/p CABG in 1999  . Peripheral vascular disease   . Heart murmur   . Myocardial infarction     "I had one but didn't know it" (01/22/2013)  . PAD (peripheral artery disease)   . IDDM (insulin dependent diabetes mellitus)   . Anemia 08/29/2011  . GERD (gastroesophageal reflux  disease)   . H/O hiatal hernia   . Arthritis     "legs" (01/22/2013)    Current Outpatient Prescriptions  Medication Sig Dispense Refill  . ACCU-CHEK AVIVA PLUS test strip TEST as directed three times a day 100 each 12  . acyclovir (ZOVIRAX) 400 MG tablet Take 1 tablet (400 mg total) by mouth daily. 30 tablet 5  . allopurinol (ZYLOPRIM) 300 MG tablet TAKE 1/2 TABLET BY MOUTH ONCE DAILY 45 tablet 3  . Ascorbic Acid (VITAMIN C) 100 MG tablet Take 100 mg by mouth daily.    Marland Kitchen aspirin 81 MG tablet Take 81 mg by mouth daily.    Marland Kitchen atorvastatin (LIPITOR) 80 MG tablet TAKE 1 TABLET BY MOUTH EVERY DAY 90 tablet 2  . Choline Fenofibrate (FENOFIBRIC ACID) 45 MG CPDR TAKE 1 CAPSULE BY MOUTH DAILY 90 capsule 2  . cloNIDine (CATAPRES - DOSED IN MG/24 HR) 0.2 mg/24hr patch Place 0.2 mg onto the skin once a week. Sunday    . clopidogrel (PLAVIX) 75 MG tablet TAKE 1 TABLET BY MOUTH DAILY 90 tablet 3  . felodipine (PLENDIL) 10 MG 24 hr tablet TAKE 2 TABLETS BY MOUTH EVERY DAY 180 tablet 1  . ferrous sulfate 325 (65 FE) MG tablet Take 1 tablet (325 mg total) by mouth daily with breakfast.  3  . furosemide (LASIX) 40 MG tablet Take 40 mg by mouth 2 (two) times daily with breakfast and lunch. Taking total of 80 mg  daily.    Marland Kitchen GNP GARLIC EXTRACT PO Take 1 tablet by mouth daily.      . insulin lispro (HUMALOG) 100 UNIT/ML KiwkPen Inject 0.05 mLs (5 Units total) into the skin 3 (three) times daily before meals. 15 mL 3  . LANTUS SOLOSTAR 100 UNIT/ML Solostar Pen INJECT 35 UNITS UNDER THE SKIN DAILY 15 mL 6  . lisinopril (PRINIVIL,ZESTRIL) 40 MG tablet TAKE 1 TABLET BY MOUTH EVERY DAY 90 tablet 3  . Melatonin 10 MG CAPS Take 10 mg by mouth at bedtime as needed.    . metoprolol tartrate (LOPRESSOR) 25 MG tablet TAKE 1/2 TABLET BY MOUTH TWICE DAILY 90 tablet 3  . Multiple Vitamins-Minerals (MULTIVITAMINS THER. W/MINERALS) TABS Take 1 tablet by mouth daily.      . Omega-3 Fatty Acids (FISH OIL) 1000 MG CAPS Take 2,000  mg by mouth 2 (two) times daily.     Marland Kitchen OVER THE COUNTER MEDICATION Take 1 tablet by mouth 2 (two) times daily. Macular Degenerative Eye Supplement    . pantoprazole (PROTONIX) 40 MG tablet TAKE 1 TABLET BY MOUTH EVERY DAY 90 tablet 3  . predniSONE (DELTASONE) 50 MG tablet Daily X 3 days start today, with breakfast/food 3 tablet 0  . traMADol (ULTRAM) 50 MG tablet TAKE 1 TABLET BY MOUTH FOUR TIMES DAILY 360 tablet 1   No current facility-administered medications for this visit.    Allergies:    Allergies  Allergen Reactions  . Amoxicillin Rash  . Cephalexin Rash  . Codeine Phosphate Rash    Social History:  The patient  reports that she has never smoked. She has never used smokeless tobacco. She reports that she does not drink alcohol or use illicit drugs.   Family History:  The patient's family history includes Heart disease in her mother; Pneumonia in her father.   ROS:  Please see the history of present illness.  No nausea, vomiting.  No fevers, chills.  No focal weakness.  No dysuria. Leg pain as noted.  All other systems reviewed and negative.   PHYSICAL EXAM: VS:  Ht 5\' 6"  (1.676 m) Well nourished, well developed, in no acute distress HEENT: normal Neck: no JVD, no carotid bruits Cardiac:  normal S1, S2; RRR;  Lungs:  clear to auscultation bilaterally, no wheezing, rhonchi or rales Abd: soft, nontender, no hepatomegaly Ext: no edema; 1+ left PT pulse; 0 and a + right DP pulse; normal femoral pulse Skin: warm and dry Neuro:   no focal abnormalities noted  EKG:  Sinus bradycardia, T wave inversion laterally.  No change.     ASSESSMENT AND PLAN:  1. Coronary atherosclerosis of native coronary artery  stable s/p CABG in 1996.   2. Atherosclerosis of native arteries of the extremities with intermittent claudication   she had atherectomy and balloon angioplasty of the right SFA in December of 2014. Previous atherectomy of the distal left SFA a few years ago. There is  evidence of focal restenosis in left mid SFA.  However, she continues to deny claudication. Thus, I recommend continued medical therapy..  I asked her to notify me if symptoms worsen. Treatment options in the future include  drug coated balloons in order to decrease the risk of restenosis.   Signed, Kathlyn Sacramento, MD, [facility] 03/25/2014 10:52 AM

## 2014-03-25 NOTE — Patient Instructions (Signed)
Your physician wants you to follow-up in: 6 MONTHS with Dr Arida.  You will receive a reminder letter in the mail two months in advance. If you don't receive a letter, please call our office to schedule the follow-up appointment.  Your physician recommends that you continue on your current medications as directed. Please refer to the Current Medication list given to you today.  

## 2014-03-30 ENCOUNTER — Encounter: Payer: Self-pay | Admitting: Cardiovascular Disease

## 2014-04-03 ENCOUNTER — Other Ambulatory Visit: Payer: Self-pay | Admitting: Interventional Cardiology

## 2014-04-03 ENCOUNTER — Other Ambulatory Visit: Payer: Self-pay | Admitting: Family Medicine

## 2014-04-07 ENCOUNTER — Ambulatory Visit (HOSPITAL_COMMUNITY)
Admission: RE | Admit: 2014-04-07 | Discharge: 2014-04-07 | Disposition: A | Discharge status: Home / Self Care | Payer: Medicare Other | Source: Ambulatory Visit | Attending: Hematology and Oncology | Admitting: Hematology and Oncology

## 2014-04-07 DIAGNOSIS — C9110 Chronic lymphocytic leukemia of B-cell type not having achieved remission: Secondary | ICD-10-CM | POA: Diagnosis present

## 2014-04-07 LAB — GLUCOSE, CAPILLARY: GLUCOSE-CAPILLARY: 125 mg/dL — AB (ref 70–99)

## 2014-04-07 MED ORDER — FLUDEOXYGLUCOSE F - 18 (FDG) INJECTION
9.2500 | Freq: Once | INTRAVENOUS | Status: AC | PRN
  Administered 2014-04-07: 9.25 via INTRAVENOUS

## 2014-04-09 ENCOUNTER — Ambulatory Visit: Payer: Medicare Other | Admitting: Nurse Practitioner

## 2014-04-09 ENCOUNTER — Encounter: Payer: Self-pay | Admitting: Hematology and Oncology

## 2014-04-09 ENCOUNTER — Ambulatory Visit (HOSPITAL_BASED_OUTPATIENT_CLINIC_OR_DEPARTMENT_OTHER): Payer: Medicare Other | Admitting: Hematology and Oncology

## 2014-04-09 ENCOUNTER — Telehealth: Payer: Self-pay | Admitting: Hematology and Oncology

## 2014-04-09 ENCOUNTER — Ambulatory Visit: Payer: Medicare Other

## 2014-04-09 ENCOUNTER — Other Ambulatory Visit (HOSPITAL_BASED_OUTPATIENT_CLINIC_OR_DEPARTMENT_OTHER): Payer: Medicare Other

## 2014-04-09 ENCOUNTER — Ambulatory Visit (HOSPITAL_BASED_OUTPATIENT_CLINIC_OR_DEPARTMENT_OTHER): Payer: Medicare Other

## 2014-04-09 VITALS — BP 143/75 | HR 57 | Temp 97.8°F | Resp 17 | Ht 66.0 in | Wt 186.7 lb

## 2014-04-09 DIAGNOSIS — Z95828 Presence of other vascular implants and grafts: Secondary | ICD-10-CM

## 2014-04-09 DIAGNOSIS — C9110 Chronic lymphocytic leukemia of B-cell type not having achieved remission: Secondary | ICD-10-CM

## 2014-04-09 DIAGNOSIS — D630 Anemia in neoplastic disease: Secondary | ICD-10-CM

## 2014-04-09 DIAGNOSIS — I495 Sick sinus syndrome: Secondary | ICD-10-CM

## 2014-04-09 DIAGNOSIS — Z5112 Encounter for antineoplastic immunotherapy: Secondary | ICD-10-CM

## 2014-04-09 DIAGNOSIS — N183 Chronic kidney disease, stage 3 unspecified: Secondary | ICD-10-CM

## 2014-04-09 DIAGNOSIS — E0842 Diabetes mellitus due to underlying condition with diabetic polyneuropathy: Secondary | ICD-10-CM

## 2014-04-09 LAB — COMPREHENSIVE METABOLIC PANEL (CC13)
ALK PHOS: 56 U/L (ref 40–150)
ALT: 23 U/L (ref 0–55)
AST: 32 U/L (ref 5–34)
Albumin: 3.3 g/dL — ABNORMAL LOW (ref 3.5–5.0)
Anion Gap: 10 mEq/L (ref 3–11)
BILIRUBIN TOTAL: 0.29 mg/dL (ref 0.20–1.20)
BUN: 51.2 mg/dL — AB (ref 7.0–26.0)
CO2: 26 mEq/L (ref 22–29)
Calcium: 8.9 mg/dL (ref 8.4–10.4)
Chloride: 109 mEq/L (ref 98–109)
Creatinine: 2.4 mg/dL — ABNORMAL HIGH (ref 0.6–1.1)
EGFR: 20 mL/min/{1.73_m2} — AB (ref >90)
GLUCOSE: 69 mg/dL — AB (ref 70–140)
Potassium: 4.6 mEq/L (ref 3.5–5.1)
Sodium: 145 mEq/L (ref 136–145)
TOTAL PROTEIN: 5.7 g/dL — AB (ref 6.4–8.3)

## 2014-04-09 LAB — CBC WITH DIFFERENTIAL/PLATELET
BASO%: 0.4 % (ref 0.0–2.0)
Basophils Absolute: 0 10*3/uL (ref 0.0–0.1)
EOS%: 4.8 % (ref 0.0–7.0)
Eosinophils Absolute: 0.3 10*3/uL (ref 0.0–0.5)
HEMATOCRIT: 36.2 % (ref 34.8–46.6)
HGB: 11.4 g/dL — ABNORMAL LOW (ref 11.6–15.9)
LYMPH#: 1.2 10*3/uL (ref 0.9–3.3)
LYMPH%: 21.7 % (ref 14.0–49.7)
MCH: 30.2 pg (ref 25.1–34.0)
MCHC: 31.5 g/dL (ref 31.5–36.0)
MCV: 95.8 fL (ref 79.5–101.0)
MONO#: 0.7 10*3/uL (ref 0.1–0.9)
MONO%: 13.4 % (ref 0.0–14.0)
NEUT#: 3.3 10*3/uL (ref 1.5–6.5)
NEUT%: 59.7 % (ref 38.4–76.8)
Platelets: 162 10*3/uL (ref 145–400)
RBC: 3.78 10*6/uL (ref 3.70–5.45)
RDW: 16.3 % — ABNORMAL HIGH (ref 11.2–14.5)
WBC: 5.4 10*3/uL (ref 3.9–10.3)

## 2014-04-09 MED ORDER — SODIUM CHLORIDE 0.9 % IJ SOLN
10.0000 mL | INTRAMUSCULAR | Status: DC | PRN
  Administered 2014-04-09: 10 mL
  Filled 2014-04-09: qty 10

## 2014-04-09 MED ORDER — ACETAMINOPHEN 325 MG PO TABS
ORAL_TABLET | ORAL | Status: AC
  Filled 2014-04-09: qty 2

## 2014-04-09 MED ORDER — SODIUM CHLORIDE 0.9 % IV SOLN
Freq: Once | INTRAVENOUS | Status: AC
  Administered 2014-04-09: 10:00:00 via INTRAVENOUS

## 2014-04-09 MED ORDER — ACETAMINOPHEN 325 MG PO TABS
650.0000 mg | ORAL_TABLET | Freq: Once | ORAL | Status: AC
  Administered 2014-04-09: 650 mg via ORAL

## 2014-04-09 MED ORDER — SODIUM CHLORIDE 0.9 % IV SOLN
1000.0000 mg | Freq: Once | INTRAVENOUS | Status: AC
  Administered 2014-04-09: 1000 mg via INTRAVENOUS
  Filled 2014-04-09: qty 40

## 2014-04-09 MED ORDER — SODIUM CHLORIDE 0.9 % IJ SOLN
10.0000 mL | INTRAMUSCULAR | Status: DC | PRN
  Administered 2014-04-09: 10 mL via INTRAVENOUS
  Filled 2014-04-09: qty 10

## 2014-04-09 MED ORDER — HEPARIN SOD (PORK) LOCK FLUSH 100 UNIT/ML IV SOLN
500.0000 [IU] | Freq: Once | INTRAVENOUS | Status: AC | PRN
  Administered 2014-04-09: 500 [IU]
  Filled 2014-04-09: qty 5

## 2014-04-09 MED ORDER — DIPHENHYDRAMINE HCL 50 MG/ML IJ SOLN
INTRAMUSCULAR | Status: AC
  Filled 2014-04-09: qty 1

## 2014-04-09 MED ORDER — SODIUM CHLORIDE 0.9 % IV SOLN
20.0000 mg | Freq: Once | INTRAVENOUS | Status: AC
  Administered 2014-04-09: 20 mg via INTRAVENOUS
  Filled 2014-04-09: qty 2

## 2014-04-09 MED ORDER — DIPHENHYDRAMINE HCL 50 MG/ML IJ SOLN
50.0000 mg | Freq: Once | INTRAMUSCULAR | Status: AC
  Administered 2014-04-09: 50 mg via INTRAVENOUS

## 2014-04-09 NOTE — Telephone Encounter (Signed)
Added appt pt will get print oug in treatment

## 2014-04-09 NOTE — Telephone Encounter (Signed)
Gave avs & calendar for April. °

## 2014-04-09 NOTE — Patient Instructions (Signed)
[facility] Discharge Instructions for Patients Receiving Chemotherapy  Today you received the following chemotherapy agents: Gazyva To help prevent nausea and vomiting after your treatment, we encourage you to take your nausea medication:  As directed.   If you develop nausea and vomiting that is not controlled by your nausea medication, call the clinic.   BELOW ARE SYMPTOMS THAT SHOULD BE REPORTED IMMEDIATELY:  *FEVER GREATER THAN 100.5 F  *CHILLS WITH OR WITHOUT FEVER  NAUSEA AND VOMITING THAT IS NOT CONTROLLED WITH YOUR NAUSEA MEDICATION  *UNUSUAL SHORTNESS OF BREATH  *UNUSUAL BRUISING OR BLEEDING  TENDERNESS IN MOUTH AND THROAT WITH OR WITHOUT PRESENCE OF ULCERS  *URINARY PROBLEMS  *BOWEL PROBLEMS  UNUSUAL RASH Items with * indicate a potential emergency and should be followed up as soon as possible.  Feel free to call the clinic you have any questions or concerns. The clinic phone number is [phone].  Please show the [facility] at check to the Emergency Department and triage nurse.

## 2014-04-09 NOTE — Patient Instructions (Signed)

## 2014-04-10 NOTE — Assessment & Plan Note (Signed)
This is likely anemia of chronic disease. The patient denies recent history of bleeding such as epistaxis, hematuria or hematochezia. She is asymptomatic from the anemia. We will observe for now.  

## 2014-04-10 NOTE — Assessment & Plan Note (Signed)
Her diabetes control has improved recently. I congratulated her affect during recommend continue close blood sugar monitoring.

## 2014-04-10 NOTE — Progress Notes (Signed)
[facility] OFFICE PROGRESS NOTE  Patient Care Team: [name], MD as PCP - General Monna Fam, MD (Ophthalmology) [name], MD as Consulting Physician (Cardiology) Hurman Horn, MD (Ophthalmology) Heath Lark, MD as Consulting Physician (Hematology and Oncology) Estanislado Emms, MD as Consulting Physician (Nephrology)  SUMMARY OF ONCOLOGIC HISTORY:   Chronic lymphocytic leukemia   10/13/2006 Initial Diagnosis Chronic lymphocytic leukemia   12/24/2013 Procedure Kidney biopsy showed renal involvement of CLL, with associated diabetic gromerulosclerosis, moderate to severe arterionephrosclerosis   01/10/2014 Imaging PET/CT scan showed no evidence of metastatic disease   01/10/2014 Procedure She has placement of port   01/14/2014 Pathology Results FISH from peripheral blood showed deletion 13 q   01/15/2014 -  Chemotherapy She received Obinutuzumab along with chlorambucil   04/07/2014 Imaging PET CT scan is negative for disease    INTERVAL HISTORY: Please see below for problem oriented charting. She is seen prior to cycle 4 of treatment. She has no new symptoms of infection. Her blood sugar appears to be under control.  REVIEW OF SYSTEMS:   Constitutional: Denies fevers, chills or abnormal weight loss Eyes: Denies blurriness of vision Ears, nose, mouth, throat, and face: Denies mucositis or sore throat Respiratory: Denies cough, dyspnea or wheezes Cardiovascular: Denies palpitation, chest discomfort or lower extremity swelling Gastrointestinal:  Denies nausea, heartburn or change in bowel habits Skin: Denies abnormal skin rashes Lymphatics: Denies new lymphadenopathy or easy bruising Neurological:Denies numbness, tingling or new weaknesses Behavioral/Psych: Mood is stable, no new changes  All other systems were reviewed with the patient and are negative.  I have reviewed the past medical history, past surgical history, social history and family  history with the patient and they are unchanged from previous note.  ALLERGIES:  is allergic to amoxicillin; cephalexin; and codeine phosphate.  MEDICATIONS:  Current Outpatient Prescriptions  Medication Sig Dispense Refill  . ACCU-CHEK AVIVA PLUS test strip TEST as directed three times a day 100 each 12  . acyclovir (ZOVIRAX) 400 MG tablet Take 1 tablet (400 mg total) by mouth daily. 30 tablet 5  . allopurinol (ZYLOPRIM) 300 MG tablet TAKE 1/2 TABLET BY MOUTH DAILY 45 tablet 3  . Ascorbic Acid (VITAMIN C) 100 MG tablet Take 100 mg by mouth daily.    Marland Kitchen aspirin 81 MG tablet Take 81 mg by mouth daily.    Marland Kitchen atorvastatin (LIPITOR) 80 MG tablet TAKE 1 TABLET BY MOUTH DAILY 90 tablet 1  . Choline Fenofibrate (FENOFIBRIC ACID) 45 MG CPDR TAKE ONE CAPSULE BY MOUTH DAILY 90 capsule 1  . cloNIDine (CATAPRES - DOSED IN MG/24 HR) 0.2 mg/24hr patch APPLY 1 PATCH ON THE SKIN AND CHANGE WEEKLY 12 patch 3  . clopidogrel (PLAVIX) 75 MG tablet TAKE 1 TABLET BY MOUTH DAILY 90 tablet 3  . felodipine (PLENDIL) 10 MG 24 hr tablet TAKE 2 TABLETS BY MOUTH EVERY DAY 180 tablet 1  . ferrous sulfate 325 (65 FE) MG tablet Take 1 tablet (325 mg total) by mouth daily with breakfast.  3  . furosemide (LASIX) 40 MG tablet Take 40 mg by mouth 2 (two) times daily with breakfast and lunch. Taking total of 80 mg daily.    Marland Kitchen GNP GARLIC EXTRACT PO Take 1 tablet by mouth daily.      . insulin lispro (HUMALOG) 100 UNIT/ML KiwkPen Inject 0.05 mLs (5 Units total) into the skin 3 (three) times daily before meals. 15 mL 3  . LANTUS SOLOSTAR 100 UNIT/ML Solostar Pen  INJECT 35 UNITS UNDER THE SKIN DAILY 15 mL 6  . lisinopril (PRINIVIL,ZESTRIL) 40 MG tablet TAKE 1 TABLET BY MOUTH EVERY DAY 90 tablet 3  . Melatonin 10 MG CAPS Take 10 mg by mouth at bedtime as needed.    . metoprolol tartrate (LOPRESSOR) 25 MG tablet TAKE 1/2 TABLET BY MOUTH TWICE DAILY 90 tablet 3  . Multiple Vitamins-Minerals (MULTIVITAMINS THER. W/MINERALS) TABS Take 1  tablet by mouth daily.      . Omega-3 Fatty Acids (FISH OIL) 1000 MG CAPS Take 2,000 mg by mouth 2 (two) times daily.     Marland Kitchen OVER THE COUNTER MEDICATION Take 1 tablet by mouth 2 (two) times daily. Macular Degenerative Eye Supplement    . pantoprazole (PROTONIX) 40 MG tablet TAKE 1 TABLET BY MOUTH DAILY 90 tablet 3  . traMADol (ULTRAM) 50 MG tablet TAKE 1 TABLET BY MOUTH FOUR TIMES DAILY 360 tablet 1   No current facility-administered medications for this visit.    PHYSICAL EXAMINATION: ECOG PERFORMANCE STATUS: 0 - Asymptomatic  Filed Vitals:   04/09/14 0947  BP: 143/75  Pulse: 57  Temp: 97.8 F (36.6 C)  Resp: 17   Filed Weights   04/09/14 0947  Weight: 186 lb 11.2 oz (84.687 kg)    GENERAL:alert, no distress and comfortable SKIN: skin color, texture, turgor are normal, no rashes or significant lesions EYES: normal, Conjunctiva are pink and non-injected, sclera clear OROPHARYNX:no exudate, no erythema and lips, buccal mucosa, and tongue normal  NECK: supple, thyroid normal size, non-tender, without nodularity LYMPH:  no palpable lymphadenopathy in the cervical, axillary or inguinal LUNGS: clear to auscultation and percussion with normal breathing effort HEART: regular rate & rhythm and no murmurs and no lower extremity edema ABDOMEN:abdomen soft, non-tender and normal bowel sounds Musculoskeletal:no cyanosis of digits and no clubbing  NEURO: alert & oriented x 3 with fluent speech, no focal motor/sensory deficits  LABORATORY DATA:  I have reviewed the data as listed    Component Value Date/Time   NA 145 04/09/2014 0856   NA 144 12/24/2013 0828   K 4.6 04/09/2014 0856   K 4.8 12/24/2013 0828   CL 106 12/24/2013 0828   CL 110* 05/28/2012 1259   CO2 26 04/09/2014 0856   CO2 25 12/24/2013 0828   GLUCOSE 69* 04/09/2014 0856   GLUCOSE 97 12/24/2013 0828   GLUCOSE 202* 05/28/2012 1259   BUN 51.2* 04/09/2014 0856   BUN 47* 12/24/2013 0828   CREATININE 2.4* 04/09/2014 0856    CREATININE 2.26* 12/24/2013 0828   CREATININE 2.48* 09/11/2013 1408   CALCIUM 8.9 04/09/2014 0856   CALCIUM 9.2 12/24/2013 0828   PROT 5.7* 04/09/2014 0856   PROT 6.3 01/08/2014 0807   ALBUMIN 3.3* 04/09/2014 0856   ALBUMIN 3.5 01/08/2014 0807   AST 32 04/09/2014 0856   AST 19 01/08/2014 0807   ALT 23 04/09/2014 0856   ALT 17 01/08/2014 0807   ALKPHOS 56 04/09/2014 0856   ALKPHOS 68 01/08/2014 0807   BILITOT 0.29 04/09/2014 0856   BILITOT 0.3 01/08/2014 0807   GFRNONAA 21* 12/24/2013 0828   GFRNONAA 32* 09/08/2010 0851   GFRAA 24* 12/24/2013 0828   GFRAA 38* 09/08/2010 0851    No results found for: SPEP, UPEP  Lab Results  Component Value Date   WBC 5.4 04/09/2014   NEUTROABS 3.3 04/09/2014   HGB 11.4* 04/09/2014   HCT 36.2 04/09/2014   MCV 95.8 04/09/2014   PLT 162 04/09/2014  Chemistry      Component Value Date/Time   NA 145 04/09/2014 0856   NA 144 12/24/2013 0828   K 4.6 04/09/2014 0856   K 4.8 12/24/2013 0828   CL 106 12/24/2013 0828   CL 110* 05/28/2012 1259   CO2 26 04/09/2014 0856   CO2 25 12/24/2013 0828   BUN 51.2* 04/09/2014 0856   BUN 47* 12/24/2013 0828   CREATININE 2.4* 04/09/2014 0856   CREATININE 2.26* 12/24/2013 0828   CREATININE 2.48* 09/11/2013 1408      Component Value Date/Time   CALCIUM 8.9 04/09/2014 0856   CALCIUM 9.2 12/24/2013 0828   ALKPHOS 56 04/09/2014 0856   ALKPHOS 68 01/08/2014 0807   AST 32 04/09/2014 0856   AST 19 01/08/2014 0807   ALT 23 04/09/2014 0856   ALT 17 01/08/2014 0807   BILITOT 0.29 04/09/2014 0856   BILITOT 0.3 01/08/2014 0807       RADIOGRAPHIC STUDIES: I reviewed the most recent PET CT scan with her.  I have personally reviewed the radiological images as listed and agreed with the findings in the report.   ASSESSMENT & PLAN:  Chronic lymphocytic leukemia She tolerated treatment very well. Recent imaging studies show no evidence of active cancer. She is in agreement to proceed for total of 6  cycles of treatment. I will proceed with treatment without dose adjustment.   Anemia in neoplastic disease This is likely anemia of chronic disease. The patient denies recent history of bleeding such as epistaxis, hematuria or hematochezia. She is asymptomatic from the anemia. We will observe for now.   Chronic kidney disease, stage III (moderate) She is currently being followed closely with the nephrologist. Her renal function is stable.      Diabetic neuropathy Her diabetes control has improved recently. I congratulated her affect during recommend continue close blood sugar monitoring.    No orders of the defined types were placed in this encounter.   All questions were answered. The patient knows to call the clinic with any problems, questions or concerns. No barriers to learning was detected. I spent 25 minutes counseling the patient face to face. The total time spent in the appointment was 30 minutes and more than 50% was on counseling and review of test results     [facility], [place], MD 04/10/2014 10:56 AM

## 2014-04-10 NOTE — Assessment & Plan Note (Signed)
She is currently being followed closely with the nephrologist. Her renal function is stable.

## 2014-04-10 NOTE — Assessment & Plan Note (Signed)
She tolerated treatment very well. Recent imaging studies show no evidence of active cancer. She is in agreement to proceed for total of 6 cycles of treatment. I will proceed with treatment without dose adjustment.

## 2014-04-11 ENCOUNTER — Telehealth: Payer: Self-pay | Admitting: Family Medicine

## 2014-04-11 NOTE — Telephone Encounter (Signed)
Pt called and would like to speak to one of the nurses for Dr. Andria Frames about managing her medications. She said the insurance company is giving her a hard time about the 30 day and 90 day qty. She would like to know what she is suppose to do going forward. Please call to advise. jw

## 2014-04-13 ENCOUNTER — Encounter: Payer: Self-pay | Admitting: Nurse Practitioner

## 2014-04-13 NOTE — Assessment & Plan Note (Signed)
Patient presented to the [facility] today to receive cycle 4 of her Gazyva antibody therapy.  She is scheduled to return on 05/07/2014 for cycle 5 of the same regimen.

## 2014-04-13 NOTE — Assessment & Plan Note (Signed)
Was in the midst of her Gazyva infusion; it was noted that her heart rate was down to 53.  Patient denied any chest pain, chest pressure, shortness of breath or pain with inspiration.  All other vitals remained stable throughout.  After further review of previous vital signs-it was noted that patient's heart rate is typically in the 50s as baseline.  Patient was able to continue her infusion with no further difficulties.

## 2014-04-13 NOTE — Progress Notes (Signed)
SYMPTOM MANAGEMENT CLINIC   HPI: Christine Santos 69 y.o. female diagnosed with chronic lymphocytic leukemia.  Currently undergoing gazyva therapy.  Patient presented to the [facility] today to receive her fourth cycle of gazyva therapy.  Patient was noted to have a bradycardic rate of 53 during the infusion.  All of her vitals remained stable.  Patient denied any chest pain, chest pressure or shortness breath or pain with inspiration.  After reviewing patient's previous vital signs-it was noted that patient's baseline heart rate is in the 50s.  Patient was able to complete her infusion with no further difficulties.   HPI  ROS  Past Medical History  Diagnosis Date  . CLL (chronic lymphoblastic leukemia)   . Hyperlipidemia   . Hypertension   . Diastolic heart failure   . CAD (coronary artery disease)     s/p CABG in 1999  . Peripheral vascular disease   . Heart murmur   . Myocardial infarction     "I had one but didn't know it" (01/22/2013)  . PAD (peripheral artery disease)   . IDDM (insulin dependent diabetes mellitus)   . Anemia 08/29/2011  . GERD (gastroesophageal reflux disease)   . H/O hiatal hernia   . Arthritis     "legs" (01/22/2013)    Past Surgical History  Procedure Laterality Date  . Cataract extraction w/ intraocular lens  implant, bilateral Bilateral ?2011  . Heel spur excision Bilateral 1970's  . Cholecystectomy  1993  . Tubal ligation  1980's  . Cardiac catheterization      "I've had 2-4" (01/22/2013)  . Coronary artery bypass graft  10/1994    "CABG X3"  . Shoulder open rotator cuff repair Bilateral 1990's    "2 times on 1 side; once on the other"  . Angioplasty / stenting iliac      [name] 11/25/2010 (01/22/2013)  . Angioplasty / stenting femoral Right 09/2010    SFA/notes 11/25/2010 (01/22/2013)  . Angioplasty / stenting femoral Left 11/2010    SFA/notes 11/25/2010 (01/22/2013)  . Transluminal atherectomy femoral artery Right 01/22/2013    &  balloon  . 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; HYPERCHOLESTEROLEMIA; OBESITY, NOS; HYPERTENSION, BENIGN SYSTEMIC; Coronary atherosclerosis; SINUS BRADYCARDIA; Chronic systolic heart failure; PERIPHERAL VASCULAR DISEASE; REFLUX ESOPHAGITIS; IRRITABLE BOWEL SYNDROME; Chronic kidney disease, stage III (moderate); CYSTOCELE/RECTOCELE/PROLAPSE,UNSPEC.; Osteoarthritis, multiple sites; Anxiety; Stress incontinence, female; Gout; Anemia in neoplastic disease; Foot pain; Osteopenia; Diabetic retinopathy; Macular degeneration; Diabetic neuropathy; Diabetic nephropathy; Hyperkalemia; and Diabetes mellitus type 2 with neurological manifestations on her problem list.    is allergic to amoxicillin; cephalexin; and codeine phosphate.    Medication List       This list is accurate as of: 04/09/14 11:59 PM.  Always use your most recent med list.               ACCU-CHEK AVIVA PLUS test strip  Generic drug:  glucose blood  TEST as directed three times a day     acyclovir 400 MG tablet  Commonly known as:  ZOVIRAX  Take 1 tablet (400 mg total) by mouth daily.     allopurinol 300 MG tablet  Commonly known as:  ZYLOPRIM  TAKE 1/2 TABLET BY MOUTH DAILY     aspirin 81 MG tablet  Take  81 mg by mouth daily.     atorvastatin 80 MG tablet  Commonly known as:  LIPITOR  TAKE 1 TABLET BY MOUTH DAILY     cloNIDine 0.2 mg/24hr patch  Commonly known as:  CATAPRES - Dosed in mg/24 hr  APPLY 1 PATCH ON THE SKIN AND CHANGE WEEKLY     clopidogrel 75 MG tablet  Commonly known as:  PLAVIX  TAKE 1 TABLET BY MOUTH DAILY     felodipine 10 MG 24 hr tablet  Commonly known as:  PLENDIL  TAKE 2 TABLETS BY MOUTH EVERY DAY     Fenofibric Acid 45 MG Cpdr  TAKE ONE CAPSULE BY MOUTH  DAILY     ferrous sulfate 325 (65 FE) MG tablet  Take 1 tablet (325 mg total) by mouth daily with breakfast.     Fish Oil 1000 MG Caps  Take 2,000 mg by mouth 2 (two) times daily.     furosemide 40 MG tablet  Commonly known as:  LASIX  Take 40 mg by mouth 2 (two) times daily with breakfast and lunch. Taking total of 80 mg daily.     GNP GARLIC EXTRACT PO  Take 1 tablet by mouth daily.     insulin lispro 100 UNIT/ML KiwkPen  Commonly known as:  HUMALOG  Inject 0.05 mLs (5 Units total) into the skin 3 (three) times daily before meals.     LANTUS SOLOSTAR 100 UNIT/ML Solostar Pen  Generic drug:  Insulin Glargine  INJECT 35 UNITS UNDER THE SKIN DAILY     lisinopril 40 MG tablet  Commonly known as:  PRINIVIL,ZESTRIL  TAKE 1 TABLET BY MOUTH EVERY DAY     Melatonin 10 MG Caps  Take 10 mg by mouth at bedtime as needed.     metoprolol tartrate 25 MG tablet  Commonly known as:  LOPRESSOR  TAKE 1/2 TABLET BY MOUTH TWICE DAILY     multivitamins ther. w/minerals Tabs tablet  Take 1 tablet by mouth daily.     OVER THE COUNTER MEDICATION  Take 1 tablet by mouth 2 (two) times daily. Macular Degenerative Eye Supplement     pantoprazole 40 MG tablet  Commonly known as:  PROTONIX  TAKE 1 TABLET BY MOUTH DAILY     traMADol 50 MG tablet  Commonly known as:  ULTRAM  TAKE 1 TABLET BY MOUTH FOUR TIMES DAILY     vitamin C 100 MG tablet  Take 100 mg by mouth daily.         PHYSICAL EXAMINATION  Oncology Vitals 04/09/2014 04/09/2014 04/09/2014 04/09/2014 04/09/2014 04/09/2014 04/09/2014  Height - - - - - - 168 cm  Weight - - - - - - 84.687 kg  Weight (lbs) - - - - - - 186 lbs 11 oz  BMI (kg/m2) - - - - - - 30.13 kg/m2  Temp 97.8 98.2 98.1 - 97.2 97.9 97.8  Pulse 68 58 54 57 53 58 57  Resp 20 18 18 18 18 18 17   Resp (Historical as of 08/25/11) - - - - - - -  SpO2 95 91 92 92 93 - 92  BSA (m2) - - - - - - 1.99 m2   BP Readings from Last 3 Encounters:  04/09/14 160/67  04/09/14 143/75    03/25/14 100/52    Physical Exam  Constitutional: She is oriented to person, place, and time. She appears unhealthy.  HENT:  Head: Normocephalic and atraumatic.  Eyes: Conjunctivae and EOM are normal. Pupils  are equal, round, and reactive to light. Right eye exhibits no discharge. Left eye exhibits no discharge. Scleral icterus is present.  Neck: Normal range of motion.  Pulmonary/Chest: Effort normal. No respiratory distress.  Neurological: She is alert and oriented to person, place, and time.  Skin: Skin is warm and dry.  Psychiatric: Affect normal.  Nursing note and vitals reviewed.   LABORATORY DATA:. Appointment on 04/09/2014  Component Date Value Ref Range Status  . WBC 04/09/2014 5.4  3.9 - 10.3 10e3/uL Final  . NEUT# 04/09/2014 3.3  1.5 - 6.5 10e3/uL Final  . HGB 04/09/2014 11.4* 11.6 - 15.9 g/dL Final  . HCT 04/09/2014 36.2  34.8 - 46.6 % Final  . Platelets 04/09/2014 162  145 - 400 10e3/uL Final  . MCV 04/09/2014 95.8  79.5 - 101.0 fL Final  . MCH 04/09/2014 30.2  25.1 - 34.0 pg Final  . MCHC 04/09/2014 31.5  31.5 - 36.0 g/dL Final  . RBC 04/09/2014 3.78  3.70 - 5.45 10e6/uL Final  . RDW 04/09/2014 16.3* 11.2 - 14.5 % Final  . lymph# 04/09/2014 1.2  0.9 - 3.3 10e3/uL Final  . MONO# 04/09/2014 0.7  0.1 - 0.9 10e3/uL Final  . Eosinophils Absolute 04/09/2014 0.3  0.0 - 0.5 10e3/uL Final  . Basophils Absolute 04/09/2014 0.0  0.0 - 0.1 10e3/uL Final  . NEUT% 04/09/2014 59.7  38.4 - 76.8 % Final  . LYMPH% 04/09/2014 21.7  14.0 - 49.7 % Final  . MONO% 04/09/2014 13.4  0.0 - 14.0 % Final  . EOS% 04/09/2014 4.8  0.0 - 7.0 % Final  . BASO% 04/09/2014 0.4  0.0 - 2.0 % Final  . Sodium 04/09/2014 145  136 - 145 mEq/L Final  . Potassium 04/09/2014 4.6  3.5 - 5.1 mEq/L Final  . Chloride 04/09/2014 109  98 - 109 mEq/L Final  . CO2 04/09/2014 26  22 - 29 mEq/L Final  . Glucose 04/09/2014 69* 70 - 140 mg/dl Final  . BUN 04/09/2014 51.2* 7.0 - 26.0 mg/dL Final  . Creatinine  04/09/2014 2.4* 0.6 - 1.1 mg/dL Final  . Total Bilirubin 04/09/2014 0.29  0.20 - 1.20 mg/dL Final  . Alkaline Phosphatase 04/09/2014 56  40 - 150 U/L Final  . AST 04/09/2014 32  5 - 34 U/L Final  . ALT 04/09/2014 23  0 - 55 U/L Final  . Total Protein 04/09/2014 5.7* 6.4 - 8.3 g/dL Final  . Albumin 04/09/2014 3.3* 3.5 - 5.0 g/dL Final  . Calcium 04/09/2014 8.9  8.4 - 10.4 mg/dL Final  . Anion Gap 04/09/2014 10  3 - 11 mEq/L Final  . EGFR 04/09/2014 20* >90 ml/min/1.73 m2 Final   eGFR is calculated using the CKD-EPI Creatinine Equation (2009)  Hospital Outpatient Visit on 04/07/2014  Component Date Value Ref Range Status  . Glucose-Capillary 04/07/2014 125* 70 - 99 mg/dL Final     RADIOGRAPHIC STUDIES: No results found.  ASSESSMENT/PLAN:    Chronic lymphocytic leukemia Patient presented to the [facility] today to receive cycle 4 of her Gazyva antibody therapy.  She is scheduled to return on 05/07/2014 for cycle 5 of the same regimen.   SINUS BRADYCARDIA Was in the midst of her Gazyva infusion; it was noted that her heart rate was down to 53.  Patient denied any chest pain, chest pressure, shortness of breath or pain with inspiration.  All other vitals remained stable throughout.  After further review of previous vital signs-it was noted that patient's heart  rate is typically in the 50s as baseline.  Patient was able to continue her infusion with no further difficulties.   Patient stated understanding of all instructions; and was in agreement with this plan of care. The patient knows to call the clinic with any problems, questions or concerns.   Review/collaboration with Dr. Alvy Bimler regarding all aspects of patient's visit today.   Total time spent with patient was 15 minutes;  with greater than 75 percent of that time spent in face to face counseling regarding patient's symptoms,  and coordination of care and follow up.  Disclaimer: This note was dictated with voice recognition  software. Similar sounding words can inadvertently be transcribed and may not be corrected upon review.   Drue Second, NP 04/13/2014

## 2014-04-15 ENCOUNTER — Telehealth: Payer: Self-pay | Admitting: *Deleted

## 2014-04-15 NOTE — Telephone Encounter (Signed)
Spoke with patient about this issue, informed her that the reason why the medications have been getting denied are because they are getting filled as name brand. Informed patient that she will need to call pharmacy about that and inform them that she wants generic. rx's were prescribed by Dr. Mora Appl.

## 2014-04-15 NOTE — Telephone Encounter (Signed)
Spoke with patient. States she is doing well, no N/V or diarrhea. No problems since chemo

## 2014-04-15 NOTE — Telephone Encounter (Signed)
Pt called on Friday but no one has returned her call. She would like to speak to [name] if possible to go over the medication and see what they are doing next. jw

## 2014-04-30 ENCOUNTER — Other Ambulatory Visit: Payer: Self-pay | Admitting: *Deleted

## 2014-05-01 ENCOUNTER — Telehealth: Payer: Self-pay | Admitting: Cardiovascular Disease

## 2014-05-01 ENCOUNTER — Telehealth: Payer: Self-pay | Admitting: Interventional Cardiology

## 2014-05-01 ENCOUNTER — Telehealth: Payer: Self-pay | Admitting: Family Medicine

## 2014-05-01 DIAGNOSIS — I10 Essential (primary) hypertension: Secondary | ICD-10-CM

## 2014-05-01 NOTE — Telephone Encounter (Signed)
Called and spoke with pt and informed her that I received paperwork from [facility] in regards to prior authorization needed. Informed pt that I would initiate prior authorization and that it can take up to 5 business days to hear back on approval. Pt verbalized understanding and was in agreement with this plan.

## 2014-05-01 NOTE — Telephone Encounter (Signed)
Prior Authorization received from [place] for Clonidine 0.2 mg/24H Weekly patch. Formulary and PA form placed in provider box for completion. Derl Barrow, RN

## 2014-05-01 NOTE — Telephone Encounter (Signed)
Pt needs auth from insurance co--for Felodipine and Fenofibrate [phone]

## 2014-05-01 NOTE — Telephone Encounter (Signed)
Spoke with pt regarding PA for felodipine.  [facility] did not receive a PA.  Advised pt to call [facility] to check if they received a PA form since a provider from their clinic just refilled the medication.  Derl Barrow, RN

## 2014-05-01 NOTE — Telephone Encounter (Signed)
Pt checking status of prior auth for felodipine

## 2014-05-05 NOTE — Telephone Encounter (Signed)
Pt is calling back because she still has not been able to get her medication. Please call so that she can get her medication jw

## 2014-05-06 NOTE — Telephone Encounter (Signed)
Tamika I do not know WHAT to put on the prior authorization for her cl;onidine patches--I have looked through chart and I am not sure why the patch is needed instead of pill. Dr. Andria Frames will be back tomorrow and he will be able to handle this. Please let her know I am sorry for the delay--I CAN call in some oral clonidine for her in interim if she is running out If she wants that please let me know--otherwise please ask Dr. Lemmie Evens when he returns tomorrow AM [facility]! Dorcas Mcmurray

## 2014-05-06 NOTE — Telephone Encounter (Signed)
Spoke with Mrs. Tatton today about the PA for clonidine patches.  Pt informed that Dr. Andria Frames will return tomorrow 05/07/14.  Dr. Nori Riis will forward PA form over to Dr. Andria Frames. Pt stated she has a patch on now that will need to be replaced Sunday.  Once PA form is completed; nurse will call insurance company to see if PA form a can be expedited.  Pt stated understanding.  Derl Barrow, RN

## 2014-05-07 ENCOUNTER — Telehealth: Payer: Self-pay | Admitting: Hematology and Oncology

## 2014-05-07 ENCOUNTER — Ambulatory Visit: Payer: Medicare Other

## 2014-05-07 ENCOUNTER — Ambulatory Visit (HOSPITAL_BASED_OUTPATIENT_CLINIC_OR_DEPARTMENT_OTHER): Payer: Medicare Other

## 2014-05-07 ENCOUNTER — Other Ambulatory Visit (HOSPITAL_BASED_OUTPATIENT_CLINIC_OR_DEPARTMENT_OTHER): Payer: Medicare Other

## 2014-05-07 ENCOUNTER — Ambulatory Visit (HOSPITAL_BASED_OUTPATIENT_CLINIC_OR_DEPARTMENT_OTHER): Payer: Medicare Other | Admitting: Hematology and Oncology

## 2014-05-07 VITALS — BP 146/49 | HR 63 | Temp 98.2°F | Resp 18

## 2014-05-07 VITALS — BP 124/45 | HR 61 | Temp 97.5°F | Resp 18 | Ht 66.0 in | Wt 187.7 lb

## 2014-05-07 DIAGNOSIS — Z5112 Encounter for antineoplastic immunotherapy: Secondary | ICD-10-CM | POA: Diagnosis not present

## 2014-05-07 DIAGNOSIS — E1140 Type 2 diabetes mellitus with diabetic neuropathy, unspecified: Secondary | ICD-10-CM

## 2014-05-07 DIAGNOSIS — D630 Anemia in neoplastic disease: Secondary | ICD-10-CM

## 2014-05-07 DIAGNOSIS — C9110 Chronic lymphocytic leukemia of B-cell type not having achieved remission: Secondary | ICD-10-CM

## 2014-05-07 DIAGNOSIS — N183 Chronic kidney disease, stage 3 unspecified: Secondary | ICD-10-CM

## 2014-05-07 DIAGNOSIS — Z95828 Presence of other vascular implants and grafts: Secondary | ICD-10-CM

## 2014-05-07 DIAGNOSIS — E0842 Diabetes mellitus due to underlying condition with diabetic polyneuropathy: Secondary | ICD-10-CM

## 2014-05-07 LAB — COMPREHENSIVE METABOLIC PANEL (CC13)
ALT: 19 U/L (ref 0–55)
AST: 30 U/L (ref 5–34)
Albumin: 3.3 g/dL — ABNORMAL LOW (ref 3.5–5.0)
Alkaline Phosphatase: 81 U/L (ref 40–150)
Anion Gap: 14 mEq/L — ABNORMAL HIGH (ref 3–11)
BILIRUBIN TOTAL: 0.22 mg/dL (ref 0.20–1.20)
BUN: 62.9 mg/dL — ABNORMAL HIGH (ref 7.0–26.0)
CALCIUM: 8.8 mg/dL (ref 8.4–10.4)
CO2: 23 mEq/L (ref 22–29)
CREATININE: 2.9 mg/dL — AB (ref 0.6–1.1)
Chloride: 108 mEq/L (ref 98–109)
EGFR: 16 mL/min/{1.73_m2} — ABNORMAL LOW (ref >90)
Glucose: 56 mg/dl — ABNORMAL LOW (ref 70–140)
POTASSIUM: 4.6 meq/L (ref 3.5–5.1)
Sodium: 145 mEq/L (ref 136–145)
TOTAL PROTEIN: 5.7 g/dL — AB (ref 6.4–8.3)

## 2014-05-07 LAB — CBC WITH DIFFERENTIAL/PLATELET
BASO%: 1 % (ref 0.0–2.0)
Basophils Absolute: 0.1 10*3/uL (ref 0.0–0.1)
EOS%: 5.7 % (ref 0.0–7.0)
Eosinophils Absolute: 0.3 10*3/uL (ref 0.0–0.5)
HEMATOCRIT: 35.1 % (ref 34.8–46.6)
HGB: 11.2 g/dL — ABNORMAL LOW (ref 11.6–15.9)
LYMPH#: 1.4 10*3/uL (ref 0.9–3.3)
LYMPH%: 29.2 % (ref 14.0–49.7)
MCH: 30.6 pg (ref 25.1–34.0)
MCHC: 31.9 g/dL (ref 31.5–36.0)
MCV: 95.9 fL (ref 79.5–101.0)
MONO#: 0.8 10*3/uL (ref 0.1–0.9)
MONO%: 16 % — ABNORMAL HIGH (ref 0.0–14.0)
NEUT#: 2.4 10*3/uL (ref 1.5–6.5)
NEUT%: 48.1 % (ref 38.4–76.8)
NRBC: 0 % (ref 0–0)
PLATELETS: 174 10*3/uL (ref 145–400)
RBC: 3.66 10*6/uL — AB (ref 3.70–5.45)
RDW: 16.3 % — ABNORMAL HIGH (ref 11.2–14.5)
WBC: 4.9 10*3/uL (ref 3.9–10.3)

## 2014-05-07 MED ORDER — SODIUM CHLORIDE 0.9 % IV SOLN
1000.0000 mg | Freq: Once | INTRAVENOUS | Status: AC
  Administered 2014-05-07: 1000 mg via INTRAVENOUS
  Filled 2014-05-07: qty 40

## 2014-05-07 MED ORDER — ACETAMINOPHEN 325 MG PO TABS
650.0000 mg | ORAL_TABLET | Freq: Once | ORAL | Status: AC
  Administered 2014-05-07: 650 mg via ORAL

## 2014-05-07 MED ORDER — SODIUM CHLORIDE 0.9 % IV SOLN
Freq: Once | INTRAVENOUS | Status: AC
  Administered 2014-05-07: 10:00:00 via INTRAVENOUS

## 2014-05-07 MED ORDER — DIPHENHYDRAMINE HCL 50 MG/ML IJ SOLN
INTRAMUSCULAR | Status: AC
  Filled 2014-05-07: qty 1

## 2014-05-07 MED ORDER — ACETAMINOPHEN 325 MG PO TABS
ORAL_TABLET | ORAL | Status: AC
  Filled 2014-05-07: qty 2

## 2014-05-07 MED ORDER — SODIUM CHLORIDE 0.9 % IV SOLN
20.0000 mg | Freq: Once | INTRAVENOUS | Status: AC
  Administered 2014-05-07: 20 mg via INTRAVENOUS
  Filled 2014-05-07: qty 2

## 2014-05-07 MED ORDER — SODIUM CHLORIDE 0.9 % IJ SOLN
10.0000 mL | INTRAMUSCULAR | Status: DC | PRN
  Administered 2014-05-07: 10 mL
  Filled 2014-05-07: qty 10

## 2014-05-07 MED ORDER — HEPARIN SOD (PORK) LOCK FLUSH 100 UNIT/ML IV SOLN
500.0000 [IU] | Freq: Once | INTRAVENOUS | Status: AC | PRN
  Administered 2014-05-07: 500 [IU]
  Filled 2014-05-07: qty 5

## 2014-05-07 MED ORDER — SODIUM CHLORIDE 0.9 % IJ SOLN
10.0000 mL | INTRAMUSCULAR | Status: DC | PRN
  Administered 2014-05-07: 10 mL via INTRAVENOUS
  Filled 2014-05-07: qty 10

## 2014-05-07 MED ORDER — DIPHENHYDRAMINE HCL 50 MG/ML IJ SOLN
50.0000 mg | Freq: Once | INTRAMUSCULAR | Status: AC
  Administered 2014-05-07: 50 mg via INTRAVENOUS

## 2014-05-07 NOTE — Assessment & Plan Note (Signed)
She is currently being followed closely with the nephrologist. Her renal function is stable.

## 2014-05-07 NOTE — Progress Notes (Signed)
[facility] OFFICE PROGRESS NOTE  Patient Care Team: [name], MD as PCP - General Monna Fam, MD (Ophthalmology) [name], MD as Consulting Physician (Cardiology) Hurman Horn, MD (Ophthalmology) Heath Lark, MD as Consulting Physician (Hematology and Oncology) Estanislado Emms, MD as Consulting Physician (Nephrology)  SUMMARY OF ONCOLOGIC HISTORY:   Chronic lymphocytic leukemia   10/13/2006 Initial Diagnosis Chronic lymphocytic leukemia   12/24/2013 Procedure Kidney biopsy showed renal involvement of CLL, with associated diabetic gromerulosclerosis, moderate to severe arterionephrosclerosis   01/10/2014 Imaging PET/CT scan showed no evidence of metastatic disease   01/10/2014 Procedure She has placement of port   01/14/2014 Pathology Results FISH from peripheral blood showed deletion 13 q   01/15/2014 -  Chemotherapy She received Obinutuzumab along with chlorambucil   04/07/2014 Imaging PET CT scan is negative for disease    INTERVAL HISTORY: Please see below for problem oriented charting. She returns today prior to cycle 5 of treatment. She have recurrent episode of hypoglycemia recently, symptomatic with palpitations and sweats. Denies recent loss of consciousness. Denies recent infection. Denies new lymphadenopathy. REVIEW OF SYSTEMS:   Constitutional: Denies fevers, chills or abnormal weight loss Eyes: Denies blurriness of vision Ears, nose, mouth, throat, and face: Denies mucositis or sore throat Respiratory: Denies cough, dyspnea or wheezes Cardiovascular: Denies palpitation, chest discomfort or lower extremity swelling Gastrointestinal:  Denies nausea, heartburn or change in bowel habits Skin: Denies abnormal skin rashes Lymphatics: Denies new lymphadenopathy or easy bruising Neurological:Denies numbness, tingling or new weaknesses Behavioral/Psych: Mood is stable, no new changes  All other systems were reviewed with the patient and are  negative.  I have reviewed the past medical history, past surgical history, social history and family history with the patient and they are unchanged from previous note.  ALLERGIES:  is allergic to amoxicillin; cephalexin; and codeine phosphate.  MEDICATIONS:  Current Outpatient Prescriptions  Medication Sig Dispense Refill  . ACCU-CHEK AVIVA PLUS test strip TEST as directed three times a day 100 each 12  . acyclovir (ZOVIRAX) 400 MG tablet Take 1 tablet (400 mg total) by mouth daily. 30 tablet 5  . allopurinol (ZYLOPRIM) 300 MG tablet TAKE 1/2 TABLET BY MOUTH DAILY 45 tablet 3  . Ascorbic Acid (VITAMIN C) 100 MG tablet Take 100 mg by mouth daily.    Marland Kitchen aspirin 81 MG tablet Take 81 mg by mouth daily.    Marland Kitchen atorvastatin (LIPITOR) 80 MG tablet TAKE 1 TABLET BY MOUTH DAILY 90 tablet 1  . Choline Fenofibrate (FENOFIBRIC ACID) 45 MG CPDR TAKE ONE CAPSULE BY MOUTH DAILY 90 capsule 1  . cloNIDine (CATAPRES - DOSED IN MG/24 HR) 0.2 mg/24hr patch APPLY 1 PATCH ON THE SKIN AND CHANGE WEEKLY 12 patch 3  . clopidogrel (PLAVIX) 75 MG tablet TAKE 1 TABLET BY MOUTH DAILY 90 tablet 3  . felodipine (PLENDIL) 10 MG 24 hr tablet TAKE 2 TABLETS BY MOUTH EVERY DAY 180 tablet 1  . ferrous sulfate 325 (65 FE) MG tablet Take 1 tablet (325 mg total) by mouth daily with breakfast.  3  . furosemide (LASIX) 40 MG tablet Take 40 mg by mouth 2 (two) times daily with breakfast and lunch. Taking total of 80 mg daily.    Marland Kitchen GNP GARLIC EXTRACT PO Take 1 tablet by mouth daily.      . insulin lispro (HUMALOG) 100 UNIT/ML KiwkPen Inject 0.05 mLs (5 Units total) into the skin 3 (three) times daily before meals. 15 mL 3  .  LANTUS SOLOSTAR 100 UNIT/ML Solostar Pen INJECT 35 UNITS UNDER THE SKIN DAILY 15 mL 6  . lisinopril (PRINIVIL,ZESTRIL) 40 MG tablet TAKE 1 TABLET BY MOUTH EVERY DAY 90 tablet 3  . Melatonin 10 MG CAPS Take 10 mg by mouth at bedtime as needed.    . metoprolol tartrate (LOPRESSOR) 25 MG tablet TAKE 1/2 TABLET BY  MOUTH TWICE DAILY 90 tablet 3  . Multiple Vitamins-Minerals (MULTIVITAMINS THER. W/MINERALS) TABS Take 1 tablet by mouth daily.      . Omega-3 Fatty Acids (FISH OIL) 1000 MG CAPS Take 2,000 mg by mouth 2 (two) times daily.     Marland Kitchen OVER THE COUNTER MEDICATION Take 1 tablet by mouth 2 (two) times daily. Macular Degenerative Eye Supplement    . pantoprazole (PROTONIX) 40 MG tablet TAKE 1 TABLET BY MOUTH DAILY 90 tablet 3  . traMADol (ULTRAM) 50 MG tablet TAKE 1 TABLET BY MOUTH FOUR TIMES DAILY 360 tablet 1   No current facility-administered medications for this visit.   Facility-Administered Medications Ordered in Other Visits  Medication Dose Route Frequency Provider Last Rate Last Dose  . sodium chloride 0.9 % injection 10 mL  10 mL Intravenous PRN Heath Lark, MD   10 mL at 05/07/14 0842    PHYSICAL EXAMINATION: ECOG PERFORMANCE STATUS: 1 - Symptomatic but completely ambulatory  Filed Vitals:   05/07/14 0859  BP: 124/45  Pulse: 61  Temp: 97.5 F (36.4 C)  Resp: 18   Filed Weights   05/07/14 0859  Weight: 187 lb 11.2 oz (85.14 kg)    GENERAL:alert, no distress and comfortable SKIN: skin color, texture, turgor are normal, no rashes or significant lesions EYES: normal, Conjunctiva are pink and non-injected, sclera clear OROPHARYNX:no exudate, no erythema and lips, buccal mucosa, and tongue normal  NECK: supple, thyroid normal size, non-tender, without nodularity LYMPH:  no palpable lymphadenopathy in the cervical, axillary or inguinal LUNGS: clear to auscultation and percussion with normal breathing effort HEART: regular rate & rhythm and no murmurs and no lower extremity edema ABDOMEN:abdomen soft, non-tender and normal bowel sounds Musculoskeletal:no cyanosis of digits and no clubbing  NEURO: alert & oriented x 3 with fluent speech, no focal motor/sensory deficits  LABORATORY DATA:  I have reviewed the data as listed    Component Value Date/Time   NA 145 04/09/2014 0856    NA 144 12/24/2013 0828   K 4.6 04/09/2014 0856   K 4.8 12/24/2013 0828   CL 106 12/24/2013 0828   CL 110* 05/28/2012 1259   CO2 26 04/09/2014 0856   CO2 25 12/24/2013 0828   GLUCOSE 69* 04/09/2014 0856   GLUCOSE 97 12/24/2013 0828   GLUCOSE 202* 05/28/2012 1259   BUN 51.2* 04/09/2014 0856   BUN 47* 12/24/2013 0828   CREATININE 2.4* 04/09/2014 0856   CREATININE 2.26* 12/24/2013 0828   CREATININE 2.48* 09/11/2013 1408   CALCIUM 8.9 04/09/2014 0856   CALCIUM 9.2 12/24/2013 0828   PROT 5.7* 04/09/2014 0856   PROT 6.3 01/08/2014 0807   ALBUMIN 3.3* 04/09/2014 0856   ALBUMIN 3.5 01/08/2014 0807   AST 32 04/09/2014 0856   AST 19 01/08/2014 0807   ALT 23 04/09/2014 0856   ALT 17 01/08/2014 0807   ALKPHOS 56 04/09/2014 0856   ALKPHOS 68 01/08/2014 0807   BILITOT 0.29 04/09/2014 0856   BILITOT 0.3 01/08/2014 0807   GFRNONAA 21* 12/24/2013 0828   GFRNONAA 32* 09/08/2010 0851   GFRAA 24* 12/24/2013 0828   GFRAA 38* 09/08/2010 [id number]  No results found for: SPEP, UPEP  Lab Results  Component Value Date   WBC 4.9 05/07/2014   NEUTROABS 2.4 05/07/2014   HGB 11.2* 05/07/2014   HCT 35.1 05/07/2014   MCV 95.9 05/07/2014   PLT 174 05/07/2014      Chemistry      Component Value Date/Time   NA 145 04/09/2014 0856   NA 144 12/24/2013 0828   K 4.6 04/09/2014 0856   K 4.8 12/24/2013 0828   CL 106 12/24/2013 0828   CL 110* 05/28/2012 1259   CO2 26 04/09/2014 0856   CO2 25 12/24/2013 0828   BUN 51.2* 04/09/2014 0856   BUN 47* 12/24/2013 0828   CREATININE 2.4* 04/09/2014 0856   CREATININE 2.26* 12/24/2013 0828   CREATININE 2.48* 09/11/2013 1408      Component Value Date/Time   CALCIUM 8.9 04/09/2014 0856   CALCIUM 9.2 12/24/2013 0828   ALKPHOS 56 04/09/2014 0856   ALKPHOS 68 01/08/2014 0807   AST 32 04/09/2014 0856   AST 19 01/08/2014 0807   ALT 23 04/09/2014 0856   ALT 17 01/08/2014 0807   BILITOT 0.29 04/09/2014 0856   BILITOT 0.3 01/08/2014 0807     ASSESSMENT &  PLAN:  Chronic lymphocytic leukemia She tolerated treatment very well. Recent imaging studies show no evidence of active cancer. She is in agreement to proceed for total of 6 cycles of treatment. I will proceed with treatment without dose adjustment.   Anemia in neoplastic disease This is likely anemia of chronic disease. The patient denies recent history of bleeding such as epistaxis, hematuria or hematochezia. She is asymptomatic from the anemia. We will observe for now.     Chronic kidney disease, stage III (moderate) She is currently being followed closely with the nephrologist. Her renal function is stable.      Diabetic neuropathy The patient has poorly controlled diabetes. Recently, her blood sugar has been running low and she was symptomatic. I recommend she adjust the dose of Lantus reducing 3 units from 35 units to 32 units to reduce the risk of hypoglycemia.    No orders of the defined types were placed in this encounter.   All questions were answered. The patient knows to call the clinic with any problems, questions or concerns. No barriers to learning was detected. I spent 25 minutes counseling the patient face to face. The total time spent in the appointment was 30 minutes and more than 50% was on counseling and review of test results     [facility], [place], MD 05/07/2014 9:15 AM

## 2014-05-07 NOTE — Assessment & Plan Note (Signed)
The patient has poorly controlled diabetes. Recently, her blood sugar has been running low and she was symptomatic. I recommend she adjust the dose of Lantus reducing 3 units from 35 units to 32 units to reduce the risk of hypoglycemia.

## 2014-05-07 NOTE — Patient Instructions (Signed)

## 2014-05-07 NOTE — Patient Instructions (Signed)
Obinutuzumab injection What is this medicine? OBINUTUZUMAB (OH bi nue TOOZ ue mab) is a monoclonal antibody. It is used to treat chronic lymphocytic leukemia. This drug targets a specific protein on cancer cells and stops them from growing. This medicine may be used for other purposes; ask your health care provider or pharmacist if you have questions. COMMON BRAND NAME(S): GAZYVA What should I tell my health care provider before I take this medicine? They need to know if you have any of these conditions: -hepatitis -low blood counts, like low white cell, platelet, or red cell counts -lung or breathing disease -heart disease -an unusual or allergic reaction to ofatumumab, other medicines, foods, dyes, or preservatives -pregnant or trying to get pregnant -breast-feeding How should I use this medicine? This medicine is for infusion into a vein. It is given by a health care professional in a hospital or clinic setting. Talk to your pediatrician regarding the use of this medicine in children. Special care may be needed. Overdosage: If you think you've taken too much of this medicine contact a poison control center or emergency room at once. Overdosage: If you think you have taken too much of this medicine contact a poison control center or emergency room at once. NOTE: This medicine is only for you. Do not share this medicine with others. What if I miss a dose? Keep appointments for follow-up doses as directed. It is important not to miss your dose. Call your doctor or health care professional if you are unable to keep an appointment. What may interact with this medicine? -live virus vaccines This list may not describe all possible interactions. Give your health care provider a list of all the medicines, herbs, non-prescription drugs, or dietary supplements you use. Also tell them if you smoke, drink alcohol, or use illegal drugs. Some items may interact with your medicine. What should I watch  for while using this medicine? Report any side effects that you notice during your treatment right away, such as changes in your breathing, fever, chills, dizziness or lightheadedness. These effects are more common with the first dose. Visit your prescriber or health care professional for checks on your progress. You will need to have regular blood work. Report any other side effects. The side effects of this medicine can continue after you finish your treatment. Continue your course of treatment even though you feel ill unless your doctor tells you to stop. Call your doctor or health care professional for advice if you get a fever, chills or sore throat, or other symptoms of a cold or flu. Do not treat yourself. This drug decreases your body's ability to fight infections. Try to avoid being around people who are sick. This medicine may increase your risk to bruise or bleed. Call your doctor or health care professional if you notice any unusual bleeding. Be careful brushing and flossing your teeth or using a toothpick because you may get an infection or bleed more easily. If you have any dental work done, tell your dentist you are receiving this medicine. Avoid taking products that contain aspirin, acetaminophen, ibuprofen, naproxen, or ketoprofen unless instructed by your doctor. These medicines may hide a fever. Do not become pregnant while taking this medicine. Women should inform their doctor if they wish to become pregnant or think they might be pregnant. There is a potential for serious side effects to an unborn child. Talk to your health care professional or pharmacist for more information. Do not breast-feed an infant while taking this   medicine. What side effects may I notice from receiving this medicine? Side effects that you should report to your doctor or health care professional as soon as possible: -allergic reactions like skin rash, itching or hives, swelling of the face, lips, or  tongue -breathing problems -changes in vision -chest pain or chest tightness -chills -confusion, trouble speaking or understanding -cough -diarrhea -dizziness -fainting spells -fever -general ill feeling or flu-like symptoms -lightheadedness -loss of balance or coordination -nausea, vomiting -right upper belly pain -trouble walking -unusual bleeding or bruising -unusually weak or tired -yellowing of the eyes or skin Side effects that usually do not require medical attention (Report these to your doctor or health care professional if they continue or are bothersome.): -muscle pain -muscle cramps This list may not describe all possible side effects. Call your doctor for medical advice about side effects. You may report side effects to FDA at 1-800-FDA-1088. Where should I keep my medicine? This drug is only given in a hospital or clinic and will not be stored at home. NOTE: This sheet is a summary. It may not cover all possible information. If you have questions about this medicine, talk to your doctor, pharmacist, or health care provider.  2015, Elsevier/Gold Standard. (2011-12-01 18:24:29)  

## 2014-05-07 NOTE — Assessment & Plan Note (Signed)
She tolerated treatment very well. Recent imaging studies show no evidence of active cancer. She is in agreement to proceed for total of 6 cycles of treatment. I will proceed with treatment without dose adjustment.

## 2014-05-07 NOTE — Assessment & Plan Note (Signed)
This is likely anemia of chronic disease. The patient denies recent history of bleeding such as epistaxis, hematuria or hematochezia. She is asymptomatic from the anemia. We will observe for now.  

## 2014-05-07 NOTE — Telephone Encounter (Signed)
gv adn printed appt sched and avs fo rpt for MAY...sed added tx.

## 2014-05-08 ENCOUNTER — Telehealth: Payer: Self-pay | Admitting: *Deleted

## 2014-05-08 MED ORDER — CLONIDINE HCL 0.2 MG PO TABS: 0.2000 mg | ORAL_TABLET | Freq: Two times a day (BID) | ORAL | Status: DC

## 2014-05-08 NOTE — Telephone Encounter (Signed)
Insurance is no longer covering Felodipine. Spoke with pt today and she states that she spoke with insurance company last week and they told her that Amlodipine is covered versus the Felodipine. Will forward to Dr. Irish Lack for review and advisement.

## 2014-05-08 NOTE — Telephone Encounter (Signed)
Discussed with patient.  I prefer patch due to less rebound concern than pills.  Patient is quite good about compliance and is willing to switch to pills.  Rx sent.

## 2014-05-09 ENCOUNTER — Telehealth: Payer: Self-pay

## 2014-05-09 MED ORDER — AMLODIPINE BESYLATE 5 MG PO TABS: 5.0000 mg | ORAL_TABLET | Freq: Every day | ORAL | Status: DC

## 2014-05-09 NOTE — Telephone Encounter (Signed)
Ok to use amlodipine 5 mg daily

## 2014-05-09 NOTE — Telephone Encounter (Signed)
F/u ° ° °Pt returning your call °

## 2014-05-09 NOTE — Telephone Encounter (Signed)
Is there a fenofibrate of the same strength?

## 2014-05-09 NOTE — Telephone Encounter (Signed)
I will forward this message to Dr Irish Lack (primary cardiologist) to make further recommendations in regards to medication.   Per Guinevere Ferrari in refills: Patient called needs something else to take in place of fenofibric acid, she said her insurance would not pay for this but would pay for fenofibrate

## 2014-05-09 NOTE — Telephone Encounter (Signed)
Spoke with pt and made her aware of new order for Amlodipine 5mg  daily. Verified pharmacy and informed pt that I would send prescription over to pharmacy. Pt verbalized understanding and was in agreement with this plan.

## 2014-05-09 NOTE — Telephone Encounter (Signed)
Left message to call back  

## 2014-05-12 ENCOUNTER — Other Ambulatory Visit: Payer: Self-pay

## 2014-05-12 NOTE — Telephone Encounter (Signed)
OK to use whichever is covered by her insurance.

## 2014-05-13 ENCOUNTER — Telehealth: Payer: Self-pay | Admitting: Interventional Cardiology

## 2014-05-13 ENCOUNTER — Other Ambulatory Visit: Payer: Self-pay

## 2014-05-13 MED ORDER — FENOFIBRATE 48 MG PO TABS: 48.0000 mg | ORAL_TABLET | Freq: Every day | ORAL | Status: DC

## 2014-05-13 NOTE — Telephone Encounter (Signed)
Pt called and stated that she had received a message from [facility] in refills stating that she wanted to make sure that pt was aware of her medication change. Informed pt that I would let Rose know that I had taken care of it. Pt verbalized understanding.

## 2014-05-13 NOTE — Telephone Encounter (Signed)
OK for 48 mg daily

## 2014-05-13 NOTE — Telephone Encounter (Signed)
Spoke with pt and informed her of new order for Fenofibrate 48mg  daily. Pt verbalized understanding and was in agreement with this plan.

## 2014-05-13 NOTE — Telephone Encounter (Signed)
New message       Talk to Anderson Malta again

## 2014-05-25 ENCOUNTER — Other Ambulatory Visit: Payer: Self-pay | Admitting: Family Medicine

## 2014-06-04 ENCOUNTER — Ambulatory Visit (HOSPITAL_BASED_OUTPATIENT_CLINIC_OR_DEPARTMENT_OTHER): Payer: Self-pay | Admitting: Nurse Practitioner

## 2014-06-04 ENCOUNTER — Encounter: Payer: Self-pay | Admitting: Hematology and Oncology

## 2014-06-04 ENCOUNTER — Encounter: Payer: Self-pay | Admitting: Nurse Practitioner

## 2014-06-04 ENCOUNTER — Other Ambulatory Visit (HOSPITAL_BASED_OUTPATIENT_CLINIC_OR_DEPARTMENT_OTHER): Payer: Medicare Other

## 2014-06-04 ENCOUNTER — Ambulatory Visit: Payer: Medicare Other

## 2014-06-04 ENCOUNTER — Telehealth: Payer: Self-pay | Admitting: Hematology and Oncology

## 2014-06-04 ENCOUNTER — Ambulatory Visit (HOSPITAL_BASED_OUTPATIENT_CLINIC_OR_DEPARTMENT_OTHER): Payer: Medicare Other

## 2014-06-04 ENCOUNTER — Ambulatory Visit (HOSPITAL_BASED_OUTPATIENT_CLINIC_OR_DEPARTMENT_OTHER): Payer: Medicare Other | Admitting: Hematology and Oncology

## 2014-06-04 VITALS — BP 182/49 | HR 58 | Temp 98.1°F | Resp 18 | Ht 66.0 in | Wt 188.4 lb

## 2014-06-04 VITALS — BP 136/60 | HR 60 | Temp 97.9°F | Resp 16

## 2014-06-04 DIAGNOSIS — N183 Chronic kidney disease, stage 3 unspecified: Secondary | ICD-10-CM

## 2014-06-04 DIAGNOSIS — Z5112 Encounter for antineoplastic immunotherapy: Secondary | ICD-10-CM

## 2014-06-04 DIAGNOSIS — D630 Anemia in neoplastic disease: Secondary | ICD-10-CM

## 2014-06-04 DIAGNOSIS — C9110 Chronic lymphocytic leukemia of B-cell type not having achieved remission: Secondary | ICD-10-CM

## 2014-06-04 DIAGNOSIS — I10 Essential (primary) hypertension: Secondary | ICD-10-CM

## 2014-06-04 DIAGNOSIS — Z95828 Presence of other vascular implants and grafts: Secondary | ICD-10-CM

## 2014-06-04 LAB — CBC WITH DIFFERENTIAL/PLATELET
BASO%: 0.6 % (ref 0.0–2.0)
Basophils Absolute: 0 10*3/uL (ref 0.0–0.1)
EOS%: 5.6 % (ref 0.0–7.0)
Eosinophils Absolute: 0.3 10*3/uL (ref 0.0–0.5)
HEMATOCRIT: 35.8 % (ref 34.8–46.6)
HGB: 11.4 g/dL — ABNORMAL LOW (ref 11.6–15.9)
LYMPH%: 31.1 % (ref 14.0–49.7)
MCH: 30.9 pg (ref 25.1–34.0)
MCHC: 31.8 g/dL (ref 31.5–36.0)
MCV: 97 fL (ref 79.5–101.0)
MONO#: 0.8 10*3/uL (ref 0.1–0.9)
MONO%: 15.5 % — ABNORMAL HIGH (ref 0.0–14.0)
NEUT#: 2.3 10*3/uL (ref 1.5–6.5)
NEUT%: 47.2 % (ref 38.4–76.8)
PLATELETS: 174 10*3/uL (ref 145–400)
RBC: 3.69 10*6/uL — ABNORMAL LOW (ref 3.70–5.45)
RDW: 16.3 % — ABNORMAL HIGH (ref 11.2–14.5)
WBC: 4.8 10*3/uL (ref 3.9–10.3)
lymph#: 1.5 10*3/uL (ref 0.9–3.3)

## 2014-06-04 LAB — COMPREHENSIVE METABOLIC PANEL (CC13)
ALBUMIN: 3.2 g/dL — AB (ref 3.5–5.0)
ALK PHOS: 81 U/L (ref 40–150)
ALT: 22 U/L (ref 0–55)
AST: 31 U/L (ref 5–34)
Anion Gap: 12 mEq/L — ABNORMAL HIGH (ref 3–11)
BILIRUBIN TOTAL: 0.25 mg/dL (ref 0.20–1.20)
BUN: 57.6 mg/dL — ABNORMAL HIGH (ref 7.0–26.0)
CO2: 23 mEq/L (ref 22–29)
Calcium: 8.8 mg/dL (ref 8.4–10.4)
Chloride: 107 mEq/L (ref 98–109)
Creatinine: 2.5 mg/dL — ABNORMAL HIGH (ref 0.6–1.1)
EGFR: 19 mL/min/{1.73_m2} — AB (ref >90)
GLUCOSE: 119 mg/dL (ref 70–140)
Potassium: 4.4 mEq/L (ref 3.5–5.1)
Sodium: 142 mEq/L (ref 136–145)
TOTAL PROTEIN: 5.6 g/dL — AB (ref 6.4–8.3)

## 2014-06-04 MED ORDER — DIPHENHYDRAMINE HCL 25 MG PO CAPS
ORAL_CAPSULE | ORAL | Status: AC
  Filled 2014-06-04: qty 2

## 2014-06-04 MED ORDER — ACETAMINOPHEN 325 MG PO TABS
650.0000 mg | ORAL_TABLET | Freq: Once | ORAL | Status: AC
  Administered 2014-06-04: 650 mg via ORAL

## 2014-06-04 MED ORDER — CLONIDINE HCL 0.1 MG PO TABS
ORAL_TABLET | ORAL | Status: AC
  Filled 2014-06-04: qty 2

## 2014-06-04 MED ORDER — ACETAMINOPHEN 325 MG PO TABS
ORAL_TABLET | ORAL | Status: AC
  Filled 2014-06-04: qty 2

## 2014-06-04 MED ORDER — DIPHENHYDRAMINE HCL 50 MG/ML IJ SOLN
50.0000 mg | Freq: Once | INTRAMUSCULAR | Status: AC
  Administered 2014-06-04: 50 mg via INTRAVENOUS

## 2014-06-04 MED ORDER — SODIUM CHLORIDE 0.9 % IV SOLN
1000.0000 mg | Freq: Once | INTRAVENOUS | Status: AC
  Administered 2014-06-04: 1000 mg via INTRAVENOUS
  Filled 2014-06-04: qty 40

## 2014-06-04 MED ORDER — DIPHENHYDRAMINE HCL 50 MG/ML IJ SOLN
INTRAMUSCULAR | Status: AC
  Filled 2014-06-04: qty 1

## 2014-06-04 MED ORDER — SODIUM CHLORIDE 0.9 % IV SOLN
Freq: Once | INTRAVENOUS | Status: AC
  Administered 2014-06-04: 09:00:00 via INTRAVENOUS

## 2014-06-04 MED ORDER — LORAZEPAM 2 MG/ML IJ SOLN
0.5000 mg | Freq: Once | INTRAMUSCULAR | Status: AC
  Administered 2014-06-04: 0.5 mg via INTRAVENOUS

## 2014-06-04 MED ORDER — HEPARIN SOD (PORK) LOCK FLUSH 100 UNIT/ML IV SOLN
500.0000 [IU] | Freq: Once | INTRAVENOUS | Status: AC | PRN
  Administered 2014-06-04: 500 [IU]
  Filled 2014-06-04: qty 5

## 2014-06-04 MED ORDER — SODIUM CHLORIDE 0.9 % IV SOLN
20.0000 mg | Freq: Once | INTRAVENOUS | Status: AC
  Administered 2014-06-04: 20 mg via INTRAVENOUS
  Filled 2014-06-04: qty 2

## 2014-06-04 MED ORDER — SODIUM CHLORIDE 0.9 % IJ SOLN
10.0000 mL | INTRAMUSCULAR | Status: DC | PRN
  Administered 2014-06-04: 10 mL via INTRAVENOUS
  Filled 2014-06-04: qty 10

## 2014-06-04 MED ORDER — SODIUM CHLORIDE 0.9 % IJ SOLN
10.0000 mL | INTRAMUSCULAR | Status: DC | PRN
  Administered 2014-06-04: 10 mL
  Filled 2014-06-04: qty 10

## 2014-06-04 MED ORDER — CLONIDINE HCL 0.1 MG PO TABS
0.2000 mg | ORAL_TABLET | Freq: Once | ORAL | Status: AC
Start: 2014-06-04 — End: 2014-06-04
  Administered 2014-06-04: 0.2 mg via ORAL

## 2014-06-04 MED ORDER — LORAZEPAM 2 MG/ML IJ SOLN
INTRAMUSCULAR | Status: AC
  Filled 2014-06-04: qty 1

## 2014-06-04 NOTE — Assessment & Plan Note (Signed)
Patient presented to the [place] today to receive her last Gazyva chemotherapy.  Patient has a history of chronic hypertension; and takes 4 different medications for her blood pressure.  She confirmed that she did take all of her blood pressure medications as directed this morning.  Blood pressure increased to 204/61 during the infusion.  Patient was given clonidine 0.2 mg orally; and blood pressure returned to patient's baseline.  Patient was able to complete her chemotherapy today with no further difficulties.  Patient was advised to recheck her blood pressure when she gets back home; and let us know if it becomes elevated again.

## 2014-06-04 NOTE — Telephone Encounter (Addendum)
Pt confirmed labs/ov per 05/11 POF, gave pt AVS and Calendar.... KJ, pt states she was advised by MD that radiation will contact her about scheduling her port removal in June.Marland KitchenMarland KitchenMarland Kitchen

## 2014-06-04 NOTE — Patient Instructions (Signed)
[facility] Discharge Instructions for Patients Receiving Chemotherapy  Today you received the following chemotherapy agents: Gazyva   To help prevent nausea and vomiting after your treatment, we encourage you to take your nausea medication as directed.    If you develop nausea and vomiting that is not controlled by your nausea medication, call the clinic.   BELOW ARE SYMPTOMS THAT SHOULD BE REPORTED IMMEDIATELY:  *FEVER GREATER THAN 100.5 F  *CHILLS WITH OR WITHOUT FEVER  NAUSEA AND VOMITING THAT IS NOT CONTROLLED WITH YOUR NAUSEA MEDICATION  *UNUSUAL SHORTNESS OF BREATH  *UNUSUAL BRUISING OR BLEEDING  TENDERNESS IN MOUTH AND THROAT WITH OR WITHOUT PRESENCE OF ULCERS  *URINARY PROBLEMS  *BOWEL PROBLEMS  UNUSUAL RASH Items with * indicate a potential emergency and should be followed up as soon as possible.  Feel free to call the clinic you have any questions or concerns. The clinic phone number is [phone].  Please show the CHEMO ALERT CARD at check-in to the Emergency Department and triage nurse.   

## 2014-06-04 NOTE — Assessment & Plan Note (Signed)
Patient has a history of chronic renal insufficiency.  Creatinine today has decreased from 2.9 down to 2.5.  Will continue to monitor closely.

## 2014-06-04 NOTE — Progress Notes (Signed)
Upon reassessment of pts BP at 48min mark, found to be elevated up to 205/62 with 2 rechecks.  No other complaints from patient at this time.  Gazyva stopped and [employer] called to assess patient.  Per Selena Lesser and Dr. Alvy Bimler, okay to continue with chemo.  Will administer Ativan 0.5mg  IV and Clonidine 0.2mg  PO to help control BP during infusion.  Will continue to monitor throughout infusion.

## 2014-06-04 NOTE — Patient Instructions (Signed)

## 2014-06-04 NOTE — Patient Instructions (Signed)
[facility] Discharge Instructions for Patients Receiving Chemotherapy  Today you received the following chemotherapy agents: Gazyva   To help prevent nausea and vomiting after your treatment, we encourage you to take your nausea medication as directed.    If you develop nausea and vomiting that is not controlled by your nausea medication, call the clinic.   BELOW ARE SYMPTOMS THAT SHOULD BE REPORTED IMMEDIATELY:  *FEVER GREATER THAN 100.5 F  *CHILLS WITH OR WITHOUT FEVER  NAUSEA AND VOMITING THAT IS NOT CONTROLLED WITH YOUR NAUSEA MEDICATION  *UNUSUAL SHORTNESS OF BREATH  *UNUSUAL BRUISING OR BLEEDING  TENDERNESS IN MOUTH AND THROAT WITH OR WITHOUT PRESENCE OF ULCERS  *URINARY PROBLEMS  *BOWEL PROBLEMS  UNUSUAL RASH Items with * indicate a potential emergency and should be followed up as soon as possible.  Feel free to call the clinic you have any questions or concerns. The clinic phone number is [phone].  Please show the CHEMO ALERT CARD at check-in to the Emergency Department and triage nurse.   

## 2014-06-04 NOTE — Assessment & Plan Note (Signed)
Patient presented to the [facility] today to receive her last Gazyva chemotherapy.  Patient has a history of chronic hypertension; and takes 4 different medications for her blood pressure.  She confirmed that she did take all of her blood pressure medications as directed this morning.  Blood pressure did increase to 204/61.  During the infusion.  Patient was given clonidine 0.2 mg orally; blood pressure returned to patient's baseline.  Patient was able to complete her chemotherapy today with no further difficulties.  Patient has plans to return on 09/04/2014 for labs and a follow-up visit.

## 2014-06-04 NOTE — Progress Notes (Signed)
SYMPTOM MANAGEMENT CLINIC   HPI: Christine Santos 69 y.o. female diagnosed with chronic lymphocytic leukemia.  Currently undergoing Gazyva chemotherapy.  Patient presented to the [facility] today to receive her last Gazyva chemotherapy.  Patient has a history of chronic hypertension; and takes 4 different medications for her blood pressure.  She confirmed that she did take all of her blood pressure medications as directed this morning.  Blood pressure increased to 204/61 during the infusion.  Patient was given clonidine 0.2 mg orally; and blood pressure returned to patient's baseline of 136/60.  Patient denies any other symptoms whatsoever.  Patient was able to complete her chemotherapy today with no further difficulties.  Patient was advised to recheck her blood pressure when she gets back home; and let us know if it becomes elevated again.  HPI  ROS  Past Medical History  Diagnosis Date  . CLL (chronic lymphoblastic leukemia)   . Hyperlipidemia   . Hypertension   . Diastolic heart failure   . CAD (coronary artery disease)     s/p CABG in 1999  . Peripheral vascular disease   . Heart murmur   . Myocardial infarction     "I had one but didn't know it" (01/22/2013)  . PAD (peripheral artery disease)   . IDDM (insulin dependent diabetes mellitus)   . Anemia 08/29/2011  . GERD (gastroesophageal reflux disease)   . H/O hiatal hernia   . Arthritis     "legs" (01/22/2013)    Past Surgical History  Procedure Laterality Date  . Cataract extraction w/ intraocular lens  implant, bilateral Bilateral ?2011  . Heel spur excision Bilateral 1970's  . Cholecystectomy  1993  . Tubal ligation  1980's  . Cardiac catheterization      "I've had 2-4" (01/22/2013)  . Coronary artery bypass graft  10/1994    "CABG X3"  . Shoulder open rotator cuff repair Bilateral 1990's    "2 times on 1 side; once on the other"  . Angioplasty / stenting iliac      [name] 11/25/2010 (01/22/2013)  .  Angioplasty / stenting femoral Right 09/2010    SFA/notes 11/25/2010 (01/22/2013)  . Angioplasty / stenting femoral Left 11/2010    SFA/notes 11/25/2010 (01/22/2013)  . Transluminal atherectomy femoral artery Right 01/22/2013    & balloon  . 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; HYPERCHOLESTEROLEMIA; OBESITY, NOS; HYPERTENSION, BENIGN SYSTEMIC; Coronary atherosclerosis; SINUS BRADYCARDIA; Chronic systolic heart failure; PERIPHERAL VASCULAR DISEASE; REFLUX ESOPHAGITIS; IRRITABLE BOWEL SYNDROME; Chronic kidney disease, stage III (moderate); CYSTOCELE/RECTOCELE/PROLAPSE,UNSPEC.; Osteoarthritis, multiple sites; Anxiety; Stress incontinence, female; Gout; Anemia in neoplastic disease; Foot pain; Osteopenia; Diabetic retinopathy; Macular degeneration; Diabetic neuropathy; Diabetic nephropathy; Hyperkalemia; and Diabetes mellitus type 2 with neurological manifestations on her problem list.    is allergic to amoxicillin; cephalexin; and codeine phosphate.    Medication List       This list is accurate as of: 06/04/14  4:04 PM.  Always use your most recent med list.               ACCU-CHEK AVIVA PLUS test strip  Generic drug:  glucose blood  TEST as directed three times a day     acyclovir 400 MG tablet  Commonly known as:  ZOVIRAX  Take 1 tablet (400 mg  total) by mouth daily.     allopurinol 300 MG tablet  Commonly known as:  ZYLOPRIM  TAKE 1/2 TABLET BY MOUTH DAILY     amLODipine 5 MG tablet  Commonly known as:  NORVASC  Take 1 tablet (5 mg total) by mouth daily.     aspirin 81 MG tablet  Take 81 mg by mouth daily.     atorvastatin 80 MG tablet  Commonly known as:  LIPITOR  TAKE 1 TABLET BY MOUTH DAILY     cloNIDine 0.2 MG tablet  Commonly known  as:  CATAPRES  Take 1 tablet (0.2 mg total) by mouth 2 (two) times daily.     clopidogrel 75 MG tablet  Commonly known as:  PLAVIX  TAKE 1 TABLET BY MOUTH DAILY     fenofibrate 48 MG tablet  Commonly known as:  TRICOR  Take 1 tablet (48 mg total) by mouth daily.     ferrous sulfate 325 (65 FE) MG tablet  Take 1 tablet (325 mg total) by mouth daily with breakfast.     Fish Oil 1000 MG Caps  Take 2,000 mg by mouth 2 (two) times daily.     furosemide 40 MG tablet  Commonly known as:  LASIX  Take 40 mg by mouth 2 (two) times daily with breakfast and lunch. Taking total of 80 mg daily.     GNP GARLIC EXTRACT PO  Take 1 tablet by mouth daily.     insulin lispro 100 UNIT/ML KiwkPen  Commonly known as:  HUMALOG  Inject 0.05 mLs (5 Units total) into the skin 3 (three) times daily before meals.     LANTUS SOLOSTAR 100 UNIT/ML Solostar Pen  Generic drug:  Insulin Glargine  INJECT 35 UNITS UNDER THE SKIN DAILY     lisinopril 40 MG tablet  Commonly known as:  PRINIVIL,ZESTRIL  TAKE 1 TABLET BY MOUTH EVERY DAY     Melatonin 10 MG Caps  Take 10 mg by mouth at bedtime as needed.     metoprolol tartrate 25 MG tablet  Commonly known as:  LOPRESSOR  TAKE 1/2 TABLET BY MOUTH TWICE DAILY     multivitamins ther. w/minerals Tabs tablet  Take 1 tablet by mouth daily.     OVER THE COUNTER MEDICATION  Take 1 tablet by mouth 2 (two) times daily. Macular Degenerative Eye Supplement     pantoprazole 40 MG tablet  Commonly known as:  PROTONIX  TAKE 1 TABLET BY MOUTH DAILY     traMADol 50 MG tablet  Commonly known as:  ULTRAM  TAKE 1 TABLET BY MOUTH FOUR TIMES DAILY     vitamin C 100 MG tablet  Take 100 mg by mouth daily.         PHYSICAL EXAMINATION  Oncology Vitals 06/04/2014 06/04/2014 06/04/2014 06/04/2014 06/04/2014 06/04/2014 06/04/2014  Height - - - - - - -  Weight - - - - - - -  Weight (lbs) - - - - - - -  BMI (kg/m2) - - - - - - -  Temp 97.9 97.9 97.5 97.3 - - 98  Pulse 60  59 51 55 52 52 68  Resp 16 16 16 16 16 16 16   Resp (Historical as of 08/25/11) - - - - - - -  SpO2 100 100 98 - - - 100  BSA (m2) - - - - - - -   BP Readings from Last 3 Encounters:  06/04/14 136/60  06/04/14 182/49  05/07/14 146/49  Physical Exam  Constitutional: She is oriented to person, place, and time and well-developed, well-nourished, and in no distress.  HENT:  Head: Normocephalic and atraumatic.  Eyes: Conjunctivae and EOM are normal. Pupils are equal, round, and reactive to light. Right eye exhibits no discharge. Left eye exhibits no discharge. No scleral icterus.  Neck: Normal range of motion.  Pulmonary/Chest: Effort normal. No respiratory distress.  Musculoskeletal: Normal range of motion.  Neurological: She is alert and oriented to person, place, and time.  Psychiatric: Affect normal.  Nursing note and vitals reviewed.   LABORATORY DATA:. Appointment on 06/04/2014  Component Date Value Ref Range Status  . WBC 06/04/2014 4.8  3.9 - 10.3 10e3/uL Final  . NEUT# 06/04/2014 2.3  1.5 - 6.5 10e3/uL Final  . HGB 06/04/2014 11.4* 11.6 - 15.9 g/dL Final  . HCT 06/04/2014 35.8  34.8 - 46.6 % Final  . Platelets 06/04/2014 174  145 - 400 10e3/uL Final  . MCV 06/04/2014 97.0  79.5 - 101.0 fL Final  . MCH 06/04/2014 30.9  25.1 - 34.0 pg Final  . MCHC 06/04/2014 31.8  31.5 - 36.0 g/dL Final  . RBC 06/04/2014 3.69* 3.70 - 5.45 10e6/uL Final  . RDW 06/04/2014 16.3* 11.2 - 14.5 % Final  . lymph# 06/04/2014 1.5  0.9 - 3.3 10e3/uL Final  . MONO# 06/04/2014 0.8  0.1 - 0.9 10e3/uL Final  . Eosinophils Absolute 06/04/2014 0.3  0.0 - 0.5 10e3/uL Final  . Basophils Absolute 06/04/2014 0.0  0.0 - 0.1 10e3/uL Final  . NEUT% 06/04/2014 47.2  38.4 - 76.8 % Final  . LYMPH% 06/04/2014 31.1  14.0 - 49.7 % Final  . MONO% 06/04/2014 15.5* 0.0 - 14.0 % Final  . EOS% 06/04/2014 5.6  0.0 - 7.0 % Final  . BASO% 06/04/2014 0.6  0.0 - 2.0 % Final  . Sodium 06/04/2014 142  136 - 145 mEq/L Final    . Potassium 06/04/2014 4.4  3.5 - 5.1 mEq/L Final  . Chloride 06/04/2014 107  98 - 109 mEq/L Final  . CO2 06/04/2014 23  22 - 29 mEq/L Final  . Glucose 06/04/2014 119  70 - 140 mg/dl Final  . BUN 06/04/2014 57.6* 7.0 - 26.0 mg/dL Final  . Creatinine 06/04/2014 2.5* 0.6 - 1.1 mg/dL Final  . Total Bilirubin 06/04/2014 0.25  0.20 - 1.20 mg/dL Final  . Alkaline Phosphatase 06/04/2014 81  40 - 150 U/L Final  . AST 06/04/2014 31  5 - 34 U/L Final  . ALT 06/04/2014 22  0 - 55 U/L Final  . Total Protein 06/04/2014 5.6* 6.4 - 8.3 g/dL Final  . Albumin 06/04/2014 3.2* 3.5 - 5.0 g/dL Final  . Calcium 06/04/2014 8.8  8.4 - 10.4 mg/dL Final  . Anion Gap 06/04/2014 12* 3 - 11 mEq/L Final  . EGFR 06/04/2014 19* >90 ml/min/1.73 m2 Final   eGFR is calculated using the CKD-EPI Creatinine Equation (2009)     RADIOGRAPHIC STUDIES: No results found.  ASSESSMENT/PLAN:    Chronic lymphocytic leukemia Patient presented to the cancer Center today to receive her last Gazyva chemotherapy.  Patient has a history of chronic hypertension; and takes 4 different medications for her blood pressure.  She confirmed that she did take all of her blood pressure medications as directed this morning.  Blood pressure did increase to 204/61.  During the infusion.  Patient was given clonidine 0.2 mg orally; blood pressure returned to patient's baseline.  Patient was able to complete her chemotherapy today  with no further difficulties.  Patient has plans to return on 09/04/2014 for labs and a follow-up visit.   HYPERTENSION, BENIGN SYSTEMIC Patient presented to the [facility] today to receive her last Gazyva chemotherapy.  Patient has a history of chronic hypertension; and takes 4 different medications for her blood pressure.  She confirmed that she did take all of her blood pressure medications as directed this morning.  Blood pressure increased to 204/61 during the infusion.  Patient was given clonidine 0.2 mg orally; and  blood pressure returned to patient's baseline.  Patient was able to complete her chemotherapy today with no further difficulties.  Patient was advised to recheck her blood pressure when she gets back home; and let us know if it becomes elevated again.    Chronic kidney disease, stage III (moderate) Patient has a history of chronic renal insufficiency.  Creatinine today has decreased from 2.9 down to 2.5.  Will continue to monitor closely.   Patient stated understanding of all instructions; and was in agreement with this plan of care. The patient knows to call the clinic with any problems, questions or concerns.   Review/collaboration with Dr. Alvy Bimler regarding all aspects of patient's visit today.   Total time spent with patient was 25 minutes;  with greater than 75 percent of that time spent in face to face counseling regarding patient's symptoms,  and coordination of care and follow up.  Disclaimer: This note was dictated with voice recognition software. Similar sounding words can inadvertently be transcribed and may not be corrected upon review.   Drue Second, NP 06/04/2014

## 2014-06-04 NOTE — Progress Notes (Signed)
Pt tolerated remainder of infusion without problems.  See flowsheets for vitals.

## 2014-06-05 NOTE — Assessment & Plan Note (Signed)
She is on multiple different blood pressure medication. She admits she is somewhat anxious. I shall give her some clonidine during infusion and she will continue close follow-up with primary care doctor for blood pressure medication adjustment

## 2014-06-05 NOTE — Progress Notes (Signed)
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 She is seen prior to final treatment. She tolerated treatment well. Denies recent infection. Her blood sugar and blood pressure were well controlled at home.  REVIEW OF SYSTEMS:   Constitutional: Denies fevers, chills or abnormal weight loss Eyes: Denies blurriness of vision Ears, nose, mouth, throat, and face: Denies mucositis or sore throat Respiratory: Denies cough, dyspnea or wheezes Cardiovascular: Denies palpitation, chest discomfort or lower extremity swelling Gastrointestinal:  Denies nausea, heartburn or change in bowel habits Skin: Denies abnormal skin rashes Lymphatics: Denies new lymphadenopathy or easy bruising Neurological:Denies numbness, tingling or new weaknesses Behavioral/Psych: Mood is stable, no new changes  All other systems were reviewed with the patient and are negative.  I have reviewed the past medical history, past  surgical history, social history and family history with the patient and they are unchanged from previous note.  ALLERGIES:  is allergic to amoxicillin; cephalexin; and codeine phosphate.  MEDICATIONS:  Current Outpatient Prescriptions  Medication Sig Dispense Refill  . ACCU-CHEK AVIVA PLUS test strip TEST as directed three times a day 100 each 12  . allopurinol (ZYLOPRIM) 300 MG tablet TAKE 1/2 TABLET BY MOUTH DAILY 45 tablet 3  . amLODipine (NORVASC) 5 MG tablet Take 1 tablet (5 mg total) by mouth daily. 30 tablet 6  . Ascorbic Acid (VITAMIN C) 100 MG tablet Take 100 mg by mouth daily.    Marland Kitchen aspirin 81 MG tablet Take 81 mg by mouth daily.    Marland Kitchen atorvastatin (LIPITOR) 80 MG tablet TAKE 1 TABLET BY MOUTH DAILY 90 tablet 1  . cloNIDine (CATAPRES) 0.2 MG tablet Take 1 tablet (0.2 mg total) by mouth 2 (two) times daily. 180 tablet 3  . clopidogrel (PLAVIX) 75 MG tablet TAKE 1 TABLET BY MOUTH DAILY 90 tablet 3  . fenofibrate (TRICOR) 48 MG tablet Take 1 tablet (48 mg total) by mouth daily. 30 tablet 11  . ferrous sulfate 325 (65 FE) MG tablet Take 1 tablet (325 mg total) by mouth daily with breakfast.  3  . furosemide (LASIX) 40 MG tablet Take 40 mg by mouth 2 (two) times daily with breakfast and lunch. Taking total of 80 mg daily.    Marland Kitchen GNP GARLIC EXTRACT PO Take 1 tablet by mouth daily.      . insulin lispro (HUMALOG) 100 UNIT/ML KiwkPen Inject 0.05 mLs (5 Units total) into the skin 3 (three) times daily before meals. 15 mL 3  . LANTUS SOLOSTAR 100 UNIT/ML Solostar Pen INJECT 35 UNITS UNDER THE SKIN DAILY 15 mL 6  . lisinopril (PRINIVIL,ZESTRIL) 40 MG tablet TAKE 1  TABLET BY MOUTH EVERY DAY 90 tablet 3  . Melatonin 10 MG CAPS Take 10 mg by mouth at bedtime as needed.    . metoprolol tartrate (LOPRESSOR) 25 MG tablet TAKE 1/2 TABLET BY MOUTH TWICE DAILY 90 tablet 3  . Multiple Vitamins-Minerals (MULTIVITAMINS THER. W/MINERALS) TABS Take 1 tablet by mouth daily.      . Omega-3 Fatty Acids (FISH  OIL) 1000 MG CAPS Take 2,000 mg by mouth 2 (two) times daily.     Marland Kitchen OVER THE COUNTER MEDICATION Take 1 tablet by mouth 2 (two) times daily. Macular Degenerative Eye Supplement    . pantoprazole (PROTONIX) 40 MG tablet TAKE 1 TABLET BY MOUTH DAILY 90 tablet 3  . traMADol (ULTRAM) 50 MG tablet TAKE 1 TABLET BY MOUTH FOUR TIMES DAILY 360 tablet 1   No current facility-administered medications for this visit.    PHYSICAL EXAMINATION: ECOG PERFORMANCE STATUS: 0 - Asymptomatic  Filed Vitals:   06/04/14 0902  BP: 182/49  Pulse: 58  Temp: 98.1 F (36.7 C)  Resp: 18   Filed Weights   06/04/14 0902  Weight: 188 lb 6.4 oz (85.458 kg)    GENERAL:alert, no distress and comfortable SKIN: skin color, texture, turgor are normal, no rashes or significant lesions EYES: normal, Conjunctiva are pink and non-injected, sclera clear OROPHARYNX:no exudate, no erythema and lips, buccal mucosa, and tongue normal  NECK: supple, thyroid normal size, non-tender, without nodularity LYMPH:  no palpable lymphadenopathy in the cervical, axillary or inguinal LUNGS: clear to auscultation and percussion with normal breathing effort HEART: regular rate & rhythm and no murmurs and no lower extremity edema ABDOMEN:abdomen soft, non-tender and normal bowel sounds Musculoskeletal:no cyanosis of digits and no clubbing  NEURO: alert & oriented x 3 with fluent speech, no focal motor/sensory deficits  LABORATORY DATA:  I have reviewed the data as listed    Component Value Date/Time   NA 142 06/04/2014 0853   NA 144 12/24/2013 0828   K 4.4 06/04/2014 0853   K 4.8 12/24/2013 0828   CL 106 12/24/2013 0828   CL 110* 05/28/2012 1259   CO2 23 06/04/2014 0853   CO2 25 12/24/2013 0828   GLUCOSE 119 06/04/2014 0853   GLUCOSE 97 12/24/2013 0828   GLUCOSE 202* 05/28/2012 1259   BUN 57.6* 06/04/2014 0853   BUN 47* 12/24/2013 0828   CREATININE 2.5* 06/04/2014 0853   CREATININE 2.26* 12/24/2013 0828   CREATININE 2.48*  09/11/2013 1408   CALCIUM 8.8 06/04/2014 0853   CALCIUM 9.2 12/24/2013 0828   PROT 5.6* 06/04/2014 0853   PROT 6.3 01/08/2014 0807   ALBUMIN 3.2* 06/04/2014 0853   ALBUMIN 3.5 01/08/2014 0807   AST 31 06/04/2014 0853   AST 19 01/08/2014 0807   ALT 22 06/04/2014 0853   ALT 17 01/08/2014 0807   ALKPHOS 81 06/04/2014 0853   ALKPHOS 68 01/08/2014 0807   BILITOT 0.25 06/04/2014 0853   BILITOT 0.3 01/08/2014 0807   GFRNONAA 21* 12/24/2013 0828   GFRNONAA 32* 09/08/2010 0851   GFRAA 24* 12/24/2013 0828   GFRAA 38* 09/08/2010 0851    No results found for: SPEP, UPEP  Lab Results  Component Value Date   WBC 4.8 06/04/2014   NEUTROABS 2.3 06/04/2014   HGB 11.4* 06/04/2014   HCT 35.8 06/04/2014   MCV 97.0 06/04/2014   PLT 174 06/04/2014      Chemistry      Component Value Date/Time   NA 142 06/04/2014 0853   NA 144  12/24/2013 0828   K 4.4 06/04/2014 0853   K 4.8 12/24/2013 0828   CL 106 12/24/2013 0828   CL 110* 05/28/2012 1259   CO2 23 06/04/2014 0853   CO2 25 12/24/2013 0828   BUN 57.6* 06/04/2014 0853   BUN 47* 12/24/2013 0828   CREATININE 2.5* 06/04/2014 0853   CREATININE 2.26* 12/24/2013 0828   CREATININE 2.48* 09/11/2013 1408      Component Value Date/Time   CALCIUM 8.8 06/04/2014 0853   CALCIUM 9.2 12/24/2013 0828   ALKPHOS 81 06/04/2014 0853   ALKPHOS 68 01/08/2014 0807   AST 31 06/04/2014 0853   AST 19 01/08/2014 0807   ALT 22 06/04/2014 0853   ALT 17 01/08/2014 0807   BILITOT 0.25 06/04/2014 0853   BILITOT 0.3 01/08/2014 0807       ASSESSMENT & PLAN:  Chronic lymphocytic leukemia She tolerated treatment very well. Recent imaging studies show no evidence of active cancer. She is in agreement to proceed for total of 6 cycles of treatment. I will proceed with treatment without dose adjustment. I plan to get her port removed next month and see her back in 3 months. She agreed with the plan of care.     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 The patient denies recent history of bleeding such as epistaxis, hematuria or hematochezia. She is asymptomatic from the anemia. We will observe for now.     Chronic kidney disease, stage III (moderate) She is currently being followed closely with the nephrologist. Her renal function is stable. Continue aggressive risk factor modification.      HYPERTENSION, BENIGN SYSTEMIC She is on multiple different blood pressure medication. She admits she is somewhat anxious. I shall give her some clonidine during infusion and she will continue close follow-up with primary care doctor for blood pressure medication adjustment    Orders Placed This Encounter  Procedures  . IR Removal Tun Access W/ Port W/O FL    Standing Status: Future     Number of Occurrences:      Standing Expiration Date: 08/04/2015    Order Specific Question:  Reason for exam:    Answer:  no need port, wait till first week of June    Order Specific Question:  Preferred Imaging Location?    Answer:  [facility]   All questions were answered. The patient knows to call the clinic with any problems, questions or concerns. No barriers to learning was detected. I spent 25 minutes counseling the patient face to face. The total time spent in the appointment was 30 minutes and more than 50% was on counseling and review of test results     [facility], [place], MD 06/05/2014 3:27 PM

## 2014-06-05 NOTE — Assessment & Plan Note (Signed)
She is currently being followed closely with the nephrologist. Her renal function is stable. Continue aggressive risk factor modification.

## 2014-06-05 NOTE — Assessment & Plan Note (Signed)
She tolerated treatment very well. Recent imaging studies show no evidence of active cancer. She is in agreement to proceed for total of 6 cycles of treatment. I will proceed with treatment without dose adjustment. I plan to get her port removed next month and see her back in 3 months. She agreed with the plan of care.

## 2014-06-05 NOTE — Assessment & Plan Note (Signed)
This is likely anemia of chronic disease. The patient denies recent history of bleeding such as epistaxis, hematuria or hematochezia. She is asymptomatic from the anemia. We will observe for now.  

## 2014-06-10 ENCOUNTER — Telehealth: Payer: Self-pay | Admitting: *Deleted

## 2014-06-10 ENCOUNTER — Telehealth (HOSPITAL_COMMUNITY): Payer: Self-pay | Admitting: Radiology

## 2014-06-10 NOTE — Telephone Encounter (Signed)
Tiffany with [facility] called to get an order to hold Plavix 5 days prior to scheduling pt's port removal.  Pt is scheduled for the first week of June.  Please call [phone] with order and write verbal order in chart.  Derl Barrow, RN

## 2014-06-10 NOTE — Telephone Encounter (Signed)
Called and informed patient to hold aspirin and plavix for five days prior to port removal.

## 2014-06-10 NOTE — Telephone Encounter (Signed)
Rec'd phone call from Dr. Andria Frames.  He will call patient and inform them to hold their plavix 5 days prior to IR procedure.

## 2014-07-01 ENCOUNTER — Other Ambulatory Visit: Payer: Self-pay | Admitting: Radiology

## 2014-07-02 ENCOUNTER — Ambulatory Visit (HOSPITAL_COMMUNITY)
Admission: RE | Admit: 2014-07-02 | Discharge: 2014-07-02 | Disposition: A | Discharge status: Home / Self Care | Payer: Medicare Other | Source: Ambulatory Visit | Attending: Hematology and Oncology | Admitting: Hematology and Oncology

## 2014-07-02 ENCOUNTER — Encounter (HOSPITAL_COMMUNITY): Payer: Self-pay

## 2014-07-02 DIAGNOSIS — C9111 Chronic lymphocytic leukemia of B-cell type in remission: Secondary | ICD-10-CM | POA: Diagnosis not present

## 2014-07-02 DIAGNOSIS — Z7902 Long term (current) use of antithrombotics/antiplatelets: Secondary | ICD-10-CM | POA: Insufficient documentation

## 2014-07-02 DIAGNOSIS — Z7982 Long term (current) use of aspirin: Secondary | ICD-10-CM | POA: Diagnosis not present

## 2014-07-02 DIAGNOSIS — Z452 Encounter for adjustment and management of vascular access device: Secondary | ICD-10-CM | POA: Insufficient documentation

## 2014-07-02 DIAGNOSIS — C9110 Chronic lymphocytic leukemia of B-cell type not having achieved remission: Secondary | ICD-10-CM

## 2014-07-02 LAB — PROTIME-INR
INR: 0.97 (ref 0.00–1.49)
Prothrombin Time: 13.1 seconds (ref 11.6–15.2)

## 2014-07-02 LAB — CBC
HCT: 35.2 % — ABNORMAL LOW (ref 36.0–46.0)
Hemoglobin: 11 g/dL — ABNORMAL LOW (ref 12.0–15.0)
MCH: 30.1 pg (ref 26.0–34.0)
MCHC: 31.3 g/dL (ref 30.0–36.0)
MCV: 96.2 fL (ref 78.0–100.0)
PLATELETS: 204 10*3/uL (ref 150–400)
RBC: 3.66 MIL/uL — ABNORMAL LOW (ref 3.87–5.11)
RDW: 15.1 % (ref 11.5–15.5)
WBC: 5.7 10*3/uL (ref 4.0–10.5)

## 2014-07-02 LAB — GLUCOSE, CAPILLARY: GLUCOSE-CAPILLARY: 124 mg/dL — AB (ref 65–99)

## 2014-07-02 MED ORDER — LIDOCAINE-EPINEPHRINE 2 %-1:100000 IJ SOLN
INTRAMUSCULAR | Status: AC
  Filled 2014-07-02: qty 1

## 2014-07-02 MED ORDER — SODIUM CHLORIDE 0.9 % IV SOLN
INTRAVENOUS | Status: DC
  Administered 2014-07-02: 10:00:00 via INTRAVENOUS

## 2014-07-02 MED ORDER — VANCOMYCIN HCL IN DEXTROSE 1-5 GM/200ML-% IV SOLN
1000.0000 mg | Freq: Once | INTRAVENOUS | Status: AC
  Administered 2014-07-02: 1000 mg via INTRAVENOUS
  Filled 2014-07-02: qty 200

## 2014-07-02 NOTE — Progress Notes (Cosign Needed)
Patient ID: Christine Santos, female   DOB: [birth year], 69 y.o.   MRN: [id number]    Referring Physician(s): Gorsuch,Ni  Subjective:  Patient with history of CLL who has completed treatment and recent imaging studies showing no evidence of active cancer. She presents today for Port-A-Cath removal (placed initially by our service 12/2013). She is currently feeling well and has no acute complaints.  Allergies: Amoxicillin; Cephalexin; and Codeine phosphate  Medications: Prior to Admission medications   Medication Sig Start Date End Date Taking? Authorizing Provider  allopurinol (ZYLOPRIM) 300 MG tablet TAKE 1/2 TABLET BY MOUTH DAILY Patient taking differently: 150mg  QD 04/03/14  Yes [name], MD  amLODipine (NORVASC) 10 MG tablet Take 20 mg by mouth 2 (two) times daily.   Yes Historical Provider, MD  Ascorbic Acid (VITAMIN C) 100 MG tablet Take 100 mg by mouth daily.   Yes Historical Provider, MD  atorvastatin (LIPITOR) 80 MG tablet TAKE 1 TABLET BY MOUTH DAILY 04/04/14  Yes Wellington Hampshire, MD  cloNIDine (CATAPRES) 0.2 MG tablet Take 1 tablet (0.2 mg total) by mouth 2 (two) times daily. 05/08/14  Yes [name], MD  fenofibrate (TRICOR) 48 MG tablet Take 1 tablet (48 mg total) by mouth daily. 05/13/14  Yes [name], MD  ferrous sulfate 325 (65 FE) MG tablet Take 1 tablet (325 mg total) by mouth daily with breakfast. 05/23/13  Yes [name], MD  furosemide (LASIX) 40 MG tablet Take 40 mg by mouth 2 (two) times daily with breakfast and lunch.  09/11/13  Yes [name], MD  GNP GARLIC EXTRACT PO Take 1 tablet by mouth 2 (two) times daily.    Yes Historical Provider, MD  insulin lispro (HUMALOG) 100 UNIT/ML KiwkPen Inject 0.05 mLs (5 Units total) into the skin 3 (three) times daily before meals. 01/27/14  Yes [name], MD  LANTUS SOLOSTAR 100 UNIT/ML Solostar Pen INJECT 35 UNITS UNDER THE SKIN DAILY Patient taking differently: INJECT 40 UNITS UNDER THE SKIN  DAILY 02/20/14  Yes [name], MD  lisinopril (PRINIVIL,ZESTRIL) 40 MG tablet TAKE 1 TABLET BY MOUTH EVERY DAY 04/03/14  Yes [name], MD  Melatonin 10 MG CAPS Take 10 mg by mouth at bedtime as needed.   Yes Historical Provider, MD  metoprolol tartrate (LOPRESSOR) 25 MG tablet TAKE 1/2 TABLET BY MOUTH TWICE DAILY 04/03/14  Yes [name], MD  Multiple Vitamins-Minerals (MULTIVITAMINS THER. W/MINERALS) TABS Take 1 tablet by mouth daily.     Yes Historical Provider, MD  Omega-3 Fatty Acids (FISH OIL) 1000 MG CAPS Take 2,000 mg by mouth 2 (two) times daily.    Yes Historical Provider, MD  OVER THE COUNTER MEDICATION Take 1 tablet by mouth 2 (two) times daily. Macular Degenerative Eye Supplement   Yes Historical Provider, MD  pantoprazole (PROTONIX) 40 MG tablet TAKE 1 TABLET BY MOUTH DAILY 04/03/14  Yes [name], MD  traMADol (ULTRAM) 50 MG tablet TAKE 1 TABLET BY MOUTH FOUR TIMES DAILY 03/20/14  Yes [name], MD  ACCU-CHEK AVIVA PLUS test strip TEST as directed three times a day 11/19/13   [name], MD  amLODipine (NORVASC) 5 MG tablet Take 1 tablet (5 mg total) by mouth daily. Patient not taking: Reported on 06/27/2014 05/09/14   [name], MD  aspirin 81 MG tablet Take 81 mg by mouth daily.    Historical Provider, MD  clopidogrel (PLAVIX) 75 MG tablet TAKE 1  TABLET BY MOUTH DAILY Patient taking differently: TAKE 1 TABLET BY MOUTH DAILY at 0600 05/26/14   [name], MD     Vital Signs: BP 184/55 mmHg  Pulse 56  Temp(Src) 97.9 F (36.6 C) (Oral)  Resp 20  SpO2 97%  Physical Exam patient awake, alert. Chest with clear breath sounds bilaterally. Clean intact right chest wall Port-A-Cath. Heart with regular rate and rhythm. Abdomen soft, nontender, positive bowel sounds. Extremities with full range of motion and mild pretibial edema.  Imaging: No results found.  Labs:  CBC:  Recent Labs  04/09/14 0856 05/07/14 0824 06/04/14 0853  07/02/14 0938  WBC 5.4 4.9 4.8 5.7  HGB 11.4* 11.2* 11.4* 11.0*  HCT 36.2 35.1 35.8 35.2*  PLT 162 174 174 204    COAGS:  Recent Labs  12/24/13 0828 01/10/14 1025 07/02/14 0938  INR 1.03 0.95 0.97  APTT 27  --   --     BMP:  Recent Labs  09/11/13 1408  12/24/13 0828  03/12/14 0927 04/09/14 0856 05/07/14 0825 06/04/14 0853  NA 141  < > 144  < > 142 145 145 142  K 4.8  < > 4.8  < > 4.5 4.6 4.6 4.4  CL 105  --  106  --   --   --   --   --   CO2 26  < > 25  < > 25 26 23 23   GLUCOSE 115*  < > 97  < > 220* 69* 56* 119  BUN 56*  < > 47*  < > 51.1* 51.2* 62.9* 57.6*  CALCIUM 9.1  < > 9.2  < > 9.5 8.9 8.8 8.8  CREATININE 2.48*  < > 2.26*  < > 2.1* 2.4* 2.9* 2.5*  GFRNONAA  --   --  21*  --   --   --   --   --   GFRAA  --   --  24*  --   --   --   --   --   < > = values in this interval not displayed.  LIVER FUNCTION TESTS:  Recent Labs  03/12/14 0927 04/09/14 0856 05/07/14 0825 06/04/14 0853  BILITOT 0.27 0.29 0.22 0.25  AST 30 32 30 31  ALT 19 23 19 22   ALKPHOS 65 56 81 81  PROT 5.8* 5.7* 5.7* 5.6*  ALBUMIN 3.3* 3.3* 3.3* 3.2*    Assessment and Plan: Patient with history of CLL who has completed treatment and with recent imaging studies showing no evidence of active disease. She presents today for Port-A-Cath removal. Details risks of procedure, including but not limited to, internal bleeding, infection discussed with patient and family with their understanding and consent.   Signed: D. Rowe Robert 07/02/2014, 10:34 AM   I spent a total of 15 minutes in face to face in clinical consultation/evaluation, greater than 50% of which was counseling/coordinating care for  port a cath removal.

## 2014-07-02 NOTE — Discharge Instructions (Signed)
Incision Care  An incision is a surgical cut to open your skin. You need to take care of your incision. This helps you to not get an infection. HOME CARE  Only take medicine as told by your doctor.  Do not take off your bandage (dressing) or get your incision wet until your doctor approves. Change the bandage and call your doctor if the bandage gets wet, dirty, or starts to smell.  Take showers. Do not take baths, swim, or do anything that may soak your incision until it heals.  Return to your normal diet and activities as told or allowed by your doctor.  Avoid lifting any weight until your doctor approves.  Put medicine that helps lessen itching on your incision as told by your doctor. Do not pick or scratch at your incision.  Keep your doctor visit to have your stitches (sutures) or staples removed.  Drink enough fluids to keep your pee (urine) clear or pale yellow. GET HELP RIGHT AWAY IF:  You have a fever.  You have a rash.  You are dizzy, or you pass out (faint) while standing.  You have trouble breathing.  You have a reaction or side effects to medicine given to you.  You have redness, puffiness (swelling), or more pain in the incision and medicine does not help.  You have fluid, blood, or yellowish-white fluid (pus) coming from the incision lasting over 1 day.  You have muscle aches, chills, or you feel sick.  You have a bad smell coming from the incision or bandage.  Your incision opens up after stitches, staples, or sticky strips have been removed.  You keep feeling sick to your stomach (nauseous) or keep throwing up (vomiting). MAKE SURE YOU:   Understand these instructions.  Will watch your condition.  Will get help right away if you are not doing well or get worse. Document Released: 04/04/2011 Document Reviewed: 03/06/2013 [facility] Patient Information 2015 [place]. This information is not intended to replace advice given to you by your health  care provider. Make sure you discuss any questions you have with your health care provider.  May remove dressing and shower 24 to 48 hours post procedure. Keep wound clean and dry. Report signs of infection.

## 2014-07-02 NOTE — Procedures (Signed)
Interventional Radiology Procedure Note  Procedure: Removal right chest portacatheter   Complications: None  Estimated Blood Loss: None  Recommendations: - Routine wound care  Signed,  Criselda Peaches, MD

## 2014-07-08 LAB — HM DIABETES EYE EXAM

## 2014-07-18 ENCOUNTER — Ambulatory Visit (INDEPENDENT_AMBULATORY_CARE_PROVIDER_SITE_OTHER): Payer: Medicare Other | Admitting: Interventional Cardiology

## 2014-07-18 ENCOUNTER — Encounter: Payer: Self-pay | Admitting: Interventional Cardiology

## 2014-07-18 ENCOUNTER — Encounter: Payer: Self-pay | Admitting: Family Medicine

## 2014-07-18 ENCOUNTER — Ambulatory Visit (INDEPENDENT_AMBULATORY_CARE_PROVIDER_SITE_OTHER): Payer: Medicare Other | Admitting: Family Medicine

## 2014-07-18 VITALS — BP 112/60 | HR 50 | Ht 65.5 in | Wt 189.0 lb

## 2014-07-18 VITALS — BP 152/52 | HR 51 | Temp 98.2°F | Ht 66.0 in | Wt 189.0 lb

## 2014-07-18 DIAGNOSIS — I10 Essential (primary) hypertension: Secondary | ICD-10-CM | POA: Diagnosis not present

## 2014-07-18 DIAGNOSIS — I739 Peripheral vascular disease, unspecified: Secondary | ICD-10-CM

## 2014-07-18 DIAGNOSIS — E7800 Pure hypercholesterolemia, unspecified: Secondary | ICD-10-CM

## 2014-07-18 DIAGNOSIS — E118 Type 2 diabetes mellitus with unspecified complications: Secondary | ICD-10-CM

## 2014-07-18 DIAGNOSIS — Z23 Encounter for immunization: Secondary | ICD-10-CM

## 2014-07-18 DIAGNOSIS — N183 Chronic kidney disease, stage 3 unspecified: Secondary | ICD-10-CM

## 2014-07-18 DIAGNOSIS — I2510 Atherosclerotic heart disease of native coronary artery without angina pectoris: Secondary | ICD-10-CM | POA: Diagnosis not present

## 2014-07-18 DIAGNOSIS — E1149 Type 2 diabetes mellitus with other diabetic neurological complication: Secondary | ICD-10-CM | POA: Diagnosis not present

## 2014-07-18 LAB — POCT GLYCOSYLATED HEMOGLOBIN (HGB A1C): Hemoglobin A1C: 6.2

## 2014-07-18 MED ORDER — CLONIDINE HCL 0.3 MG PO TABS: 0.3000 mg | ORAL_TABLET | Freq: Two times a day (BID) | ORAL | Status: DC

## 2014-07-18 NOTE — Progress Notes (Signed)
Patient ID: Christine Santos, female   DOB: 22-Jun-1945, 69 y.o.   MRN: [id number]     Cardiology Office Note   Date:  07/18/2014   ID:  Christine Santos, Christine Santos [birth year], MRN [id number]  PCP:  Christine Gottron, MD    No chief complaint on file. f/u CAD   Wt Readings from Last 3 Encounters:  07/18/14 189 lb (85.73 kg)  07/18/14 189 lb (85.73 kg)  06/04/14 188 lb 6.4 oz (85.458 kg)       History of Present Illness: Christine Santos is a 69 y.o. female  who had bilateral LE revascularization. The right leg was treated with PTA and the left SFA was treated with rotational atherectomy a few years ago.  She ha s a h/o CAD, CKD.  She has had restenosis but managed medically due to no claudication.    Was treated for CLL with chemo.  Now in remission.  Feel swell.  Tolerated Chemo well.  .     Past Medical History  Diagnosis Date  . CLL (chronic lymphoblastic leukemia)   . Hyperlipidemia   . Hypertension   . Diastolic heart failure   . CAD (coronary artery disease)     s/p CABG in 1999  . Peripheral vascular disease   . Heart murmur   . Myocardial infarction     "I had one but didn't know it" (01/22/2013)  . PAD (peripheral artery disease)   . IDDM (insulin dependent diabetes mellitus)   . Anemia 08/29/2011  . GERD (gastroesophageal reflux disease)   . H/O hiatal hernia   . Arthritis     "legs" (01/22/2013)    Past Surgical History  Procedure Laterality Date  . Cataract extraction w/ intraocular lens  implant, bilateral Bilateral ?2011  . Heel spur excision Bilateral 1970's  . Cholecystectomy  1993  . Tubal ligation  1980's  . Cardiac catheterization      "I've had 2-4" (01/22/2013)  . Coronary artery bypass graft  10/1994    "CABG X3"  . Shoulder open rotator cuff repair Bilateral 1990's    "2 times on 1 side; once on the other"  . Angioplasty / stenting iliac      Christine Santos 11/25/2010 (01/22/2013)  . Angioplasty / stenting femoral Right 09/2010    SFA/notes 11/25/2010  (01/22/2013)  . Angioplasty / stenting femoral Left 11/2010    SFA/notes 11/25/2010 (01/22/2013)  . Transluminal atherectomy femoral artery Right 01/22/2013    & balloon  . Lower extremity angiogram N/A 01/22/2013    Procedure: LOWER EXTREMITY ANGIOGRAM;  Surgeon: Christine Booze, MD;  Location: [facility] CATH LAB;  Service: Cardiovascular;  Laterality: N/A;     Current Outpatient Prescriptions  Medication Sig Dispense Refill  . ACCU-CHEK AVIVA PLUS test strip TEST as directed three times a day 100 each 12  . allopurinol (ZYLOPRIM) 300 MG tablet TAKE 1/2 TABLET BY MOUTH DAILY (Patient taking differently: 150mg  QD) 45 tablet 3  . amLODipine (NORVASC) 10 MG tablet Take 20 mg by mouth 2 (two) times daily.    . Ascorbic Acid (VITAMIN C) 100 MG tablet Take 100 mg by mouth daily.    Marland Kitchen aspirin 81 MG tablet Take 81 mg by mouth daily.    Marland Kitchen atorvastatin (LIPITOR) 80 MG tablet TAKE 1 TABLET BY MOUTH DAILY 90 tablet 1  . cloNIDine (CATAPRES) 0.3 MG tablet Take 1 tablet (0.3 mg total) by mouth 2 (two) times daily. 180 tablet 3  . clopidogrel (PLAVIX) 75 MG tablet  TAKE 1 TABLET BY MOUTH DAILY (Patient taking differently: TAKE 1 TABLET BY MOUTH DAILY at 0600) 90 tablet 3  . fenofibrate (TRICOR) 48 MG tablet Take 1 tablet (48 mg total) by mouth daily. 30 tablet 11  . ferrous sulfate 325 (65 FE) MG tablet Take 1 tablet (325 mg total) by mouth daily with breakfast.  3  . furosemide (LASIX) 40 MG tablet Take 40 mg by mouth 2 (two) times daily with breakfast and lunch.     Marland Kitchen GNP GARLIC EXTRACT PO Take 1 tablet by mouth 2 (two) times daily.     . Insulin Glargine (LANTUS SOLOSTAR) 100 UNIT/ML Solostar Pen Inject 40 Units into the skin daily.    . insulin lispro (HUMALOG) 100 UNIT/ML KiwkPen Inject 0.05 mLs (5 Units total) into the skin 3 (three) times daily before meals. 15 mL 3  . lisinopril (PRINIVIL,ZESTRIL) 40 MG tablet TAKE 1 TABLET BY MOUTH EVERY DAY 90 tablet 3  . Melatonin 10 MG CAPS Take 10 mg by mouth at  bedtime as needed (sleep).     . metoprolol tartrate (LOPRESSOR) 25 MG tablet TAKE 1/2 TABLET BY MOUTH TWICE DAILY 90 tablet 3  . Multiple Vitamins-Minerals (MULTIVITAMINS THER. W/MINERALS) TABS Take 1 tablet by mouth daily.      . Omega-3 Fatty Acids (FISH OIL) 1000 MG CAPS Take 2,000 mg by mouth 2 (two) times daily.     Marland Kitchen OVER THE COUNTER MEDICATION Take 1 tablet by mouth 2 (two) times daily. Macular Degenerative Eye Supplement    . pantoprazole (PROTONIX) 40 MG tablet TAKE 1 TABLET BY MOUTH DAILY 90 tablet 3  . PARoxetine (PAXIL) 20 MG tablet Take 20 mg by mouth every morning.  1  . traMADol (ULTRAM) 50 MG tablet TAKE 1 TABLET BY MOUTH FOUR TIMES DAILY 360 tablet 1   No current facility-administered medications for this visit.    Allergies:   Amoxicillin; Cephalexin; and Codeine phosphate    Social History:  The patient  reports that she has never smoked. She has never used smokeless tobacco. She reports that she does not drink alcohol or use illicit drugs.   Family History:  The patient's family history includes Heart attack in her mother; Heart disease in her mother; Pneumonia in her father.    ROS:  Please see the history of present illness.   Otherwise, review of systems are positive for leg  Pains, foot pain going up stairs or prolonged walking.   All other systems are reviewed and negative.    PHYSICAL EXAM: VS:  BP 112/60 mmHg  Pulse 50  Ht 5' 5.5" (1.664 m)  Wt 189 lb (85.73 kg)  BMI 30.96 kg/m2  SpO2 96% , BMI Body mass index is 30.96 kg/(m^2). GEN: Well nourished, well developed, in no acute distress HEENT: normal Neck: no JVD, carotid bruits, or masses Cardiac: RRR; no murmurs, rubs, or gallops,no edema  Respiratory:  clear to auscultation bilaterally, normal work of breathing GI: soft, nontender, nondistended, + BS MS: no deformity or atrophy Skin: warm and dry, no rash Neuro:  Strength and sensation are intact Psych: euthymic mood, full affect   EKG:   The  ekg ordered today demonstrates NSR, NSST in 3/16   Recent Labs: 10/17/2013: Magnesium 2.0 06/04/2014: ALT 22; BUN 57.6*; Creatinine 2.5*; Potassium 4.4; Sodium 142 07/02/2014: Hemoglobin 11.0*; Platelets 204   Lipid Panel    Component Value Date/Time   CHOL 154 01/08/2014 0807   TRIG 126.0 01/08/2014 0807   HDL  38.50* 01/08/2014 0807   CHOLHDL 4 01/08/2014 0807   VLDL 25.2 01/08/2014 0807   LDLCALC 90 01/08/2014 0807     Other studies Reviewed: Additional studies/ records that were reviewed today with results demonstrating: *12/15, LDL 90.   ASSESSMENT AND PLAN:  1. CAD: No angina.  COntinue aggressive secondary prevention.  COntinue statin.  2. CKD: Cr 2.5.  Still on ACE-I.  THis could be stopped if renal function worsens.  BP controlled so I am hesitant to stop it now.   3. HTN: COntinue current meds.   4. PAD: No claudication.  No tissue loss at this time.  5. Hyperlipidemia: WIll need CMet and lipids checked in December.   Current medicines are reviewed at length with the patient today.  The patient concerns regarding her medicines were addressed.  The following changes have been made:  No change  Labs/ tests ordered today include:  No orders of the defined types were placed in this encounter.    Recommend 150 minutes/week of aerobic exercise Low fat, low carb, high fiber diet recommended  Disposition:   FU in 1 year   Teresita Madura., MD  07/18/2014 11:28 AM    [facility] [address], [place], [place]  [address] Phone: [phone]; Fax: [phone]

## 2014-07-18 NOTE — Patient Instructions (Addendum)
I sent in a stronger prescription for the clonidine.  You can use up your current supply of 0.2 mg tabs by taking one three times a day.  When you get your new, stronger prescription, you will go back to taking it twice a day. My nurse will check on your mammogram and whether you have had the two pneumonia vaccines.   Your blood sugar control is great.  Let me know if you have more low blood sugar spells.  I may need to back off on the insulin dose. See me in three months. [place]

## 2014-07-18 NOTE — Patient Instructions (Signed)
Medication Instructions:  Same-no change  Labwork: Lipids and LFTs on December 29, 2014. Please do not eat or drink after midnight the night before labs are drawn.  Testing/Procedures: None  Follow-Up: Your physician wants you to follow-up in: 1 year. You will receive a reminder letter in the mail two months in advance. If you don't receive a letter, please call our office to schedule the follow-up appointment.

## 2014-07-21 NOTE — Assessment & Plan Note (Signed)
BP control is suboptimal.  Increase clonidine.

## 2014-07-21 NOTE — Progress Notes (Signed)
   Subjective:    Patient ID: Christine Santos, female    DOB: [birth year], 69 y.o.   MRN: [id number]  HPI She is now off chemo! Feels good.  No complaints.   BP is up today. DM control is great.  No low blood sugar spells HPDP - shows due for mammo.   Had recent ophthalmology appointment.  I believe I have seen the results and they are in the to be scanned documents.  Due for foot exam.  Has intermitant mild tingling of feet.   Review of Systems     Objective:   Physical ExamLungs clear Cardiac RRR without m or g Ext see diabetic foot exam.        Assessment & Plan:

## 2014-07-21 NOTE — Assessment & Plan Note (Signed)
Great control of DM.  No changes.

## 2014-07-21 NOTE — Assessment & Plan Note (Signed)
Asymptomatic. 

## 2014-08-15 ENCOUNTER — Other Ambulatory Visit: Payer: Self-pay | Admitting: Family Medicine

## 2014-09-04 ENCOUNTER — Other Ambulatory Visit (HOSPITAL_BASED_OUTPATIENT_CLINIC_OR_DEPARTMENT_OTHER): Payer: Medicare Other

## 2014-09-04 ENCOUNTER — Encounter: Payer: Self-pay | Admitting: Hematology and Oncology

## 2014-09-04 ENCOUNTER — Telehealth: Payer: Self-pay | Admitting: Hematology and Oncology

## 2014-09-04 ENCOUNTER — Ambulatory Visit (HOSPITAL_BASED_OUTPATIENT_CLINIC_OR_DEPARTMENT_OTHER): Payer: Medicare Other | Admitting: Hematology and Oncology

## 2014-09-04 VITALS — BP 182/67 | HR 56 | Temp 98.2°F | Resp 18 | Ht 65.5 in | Wt 193.8 lb

## 2014-09-04 DIAGNOSIS — I10 Essential (primary) hypertension: Secondary | ICD-10-CM | POA: Diagnosis not present

## 2014-09-04 DIAGNOSIS — C9110 Chronic lymphocytic leukemia of B-cell type not having achieved remission: Secondary | ICD-10-CM

## 2014-09-04 DIAGNOSIS — N183 Chronic kidney disease, stage 3 unspecified: Secondary | ICD-10-CM

## 2014-09-04 DIAGNOSIS — D630 Anemia in neoplastic disease: Secondary | ICD-10-CM

## 2014-09-04 LAB — COMPREHENSIVE METABOLIC PANEL (CC13)
ALK PHOS: 78 U/L (ref 40–150)
ALT: 24 U/L (ref 0–55)
AST: 28 U/L (ref 5–34)
Albumin: 3.5 g/dL (ref 3.5–5.0)
Anion Gap: 8 mEq/L (ref 3–11)
BILIRUBIN TOTAL: 0.33 mg/dL (ref 0.20–1.20)
BUN: 58.1 mg/dL — ABNORMAL HIGH (ref 7.0–26.0)
CO2: 27 mEq/L (ref 22–29)
Calcium: 9.4 mg/dL (ref 8.4–10.4)
Chloride: 107 mEq/L (ref 98–109)
Creatinine: 2.5 mg/dL — ABNORMAL HIGH (ref 0.6–1.1)
EGFR: 19 mL/min/{1.73_m2} — ABNORMAL LOW (ref >90)
Glucose: 56 mg/dl — ABNORMAL LOW (ref 70–140)
Potassium: 4.7 mEq/L (ref 3.5–5.1)
Sodium: 142 mEq/L (ref 136–145)
TOTAL PROTEIN: 6 g/dL — AB (ref 6.4–8.3)

## 2014-09-04 LAB — CBC WITH DIFFERENTIAL/PLATELET
BASO%: 0.4 % (ref 0.0–2.0)
Basophils Absolute: 0 10*3/uL (ref 0.0–0.1)
EOS%: 4 % (ref 0.0–7.0)
Eosinophils Absolute: 0.2 10*3/uL (ref 0.0–0.5)
HCT: 36.6 % (ref 34.8–46.6)
HGB: 11.7 g/dL (ref 11.6–15.9)
LYMPH%: 36.8 % (ref 14.0–49.7)
MCH: 30.3 pg (ref 25.1–34.0)
MCHC: 32 g/dL (ref 31.5–36.0)
MCV: 94.8 fL (ref 79.5–101.0)
MONO#: 0.7 10*3/uL (ref 0.1–0.9)
MONO%: 15.5 % — ABNORMAL HIGH (ref 0.0–14.0)
NEUT#: 2.1 10*3/uL (ref 1.5–6.5)
NEUT%: 43.3 % (ref 38.4–76.8)
Platelets: 243 10*3/uL (ref 145–400)
RBC: 3.86 10*6/uL (ref 3.70–5.45)
RDW: 14.7 % — ABNORMAL HIGH (ref 11.2–14.5)
WBC: 4.8 10*3/uL (ref 3.9–10.3)
lymph#: 1.8 10*3/uL (ref 0.9–3.3)

## 2014-09-04 NOTE — Progress Notes (Signed)
Christine Santos OFFICE PROGRESS NOTE  Patient Care Team: [name], MD as PCP - General Monna Fam, MD (Ophthalmology) [name], MD as Consulting Physician (Cardiology) Hurman Horn, MD (Ophthalmology) Heath Lark, MD as Consulting Physician (Hematology and Oncology) Estanislado Emms, MD as Consulting Physician (Nephrology)  SUMMARY OF ONCOLOGIC HISTORY:   Chronic lymphocytic leukemia   10/13/2006 Initial Diagnosis Chronic lymphocytic leukemia   12/24/2013 Procedure Kidney biopsy showed renal involvement of CLL, with associated diabetic gromerulosclerosis, moderate to severe arterionephrosclerosis   01/10/2014 Imaging PET/CT scan showed no evidence of metastatic disease   01/10/2014 Procedure She has placement of port   01/14/2014 Pathology Results FISH from peripheral blood showed deletion 13 q   01/15/2014 - 06/04/2014 Chemotherapy She received Obinutuzumab along with chlorambucil   04/07/2014 Imaging PET CT scan is negative for disease    INTERVAL HISTORY: Please see below for problem oriented charting. She feels well. Denies recent infection. She has great energy level.  REVIEW OF SYSTEMS:   Constitutional: Denies fevers, chills or abnormal weight loss Eyes: Denies blurriness of vision Ears, nose, mouth, throat, and face: Denies mucositis or sore throat Respiratory: Denies cough, dyspnea or wheezes Cardiovascular: Denies palpitation, chest discomfort or lower extremity swelling Gastrointestinal:  Denies nausea, heartburn or change in bowel habits Skin: Denies abnormal skin rashes Lymphatics: Denies new lymphadenopathy or easy bruising Neurological:Denies numbness, tingling or new weaknesses Behavioral/Psych: Mood is stable, no new changes  All other systems were reviewed with the patient and are negative.  I have reviewed the past medical history, past surgical history, social history and family history with the patient and they are unchanged  from previous note.  ALLERGIES:  is allergic to amoxicillin; cephalexin; and codeine phosphate.  MEDICATIONS:  Current Outpatient Prescriptions  Medication Sig Dispense Refill  . ACCU-CHEK AVIVA PLUS test strip TEST as directed three times a day 100 each 12  . allopurinol (ZYLOPRIM) 300 MG tablet TAKE 1/2 TABLET BY MOUTH DAILY (Patient taking differently: 150mg  QD) 45 tablet 3  . amLODipine (NORVASC) 10 MG tablet Take 20 mg by mouth 2 (two) times daily.    . Ascorbic Acid (VITAMIN C) 100 MG tablet Take 100 mg by mouth daily.    Marland Kitchen aspirin 81 MG tablet Take 81 mg by mouth daily.    Marland Kitchen atorvastatin (LIPITOR) 80 MG tablet TAKE 1 TABLET BY MOUTH DAILY 90 tablet 1  . cloNIDine (CATAPRES) 0.3 MG tablet Take 1 tablet (0.3 mg total) by mouth 2 (two) times daily. 180 tablet 3  . clopidogrel (PLAVIX) 75 MG tablet TAKE 1 TABLET BY MOUTH DAILY (Patient taking differently: TAKE 1 TABLET BY MOUTH DAILY at 0600) 90 tablet 3  . fenofibrate (TRICOR) 48 MG tablet Take 1 tablet (48 mg total) by mouth daily. 30 tablet 11  . ferrous sulfate 325 (65 FE) MG tablet Take 1 tablet (325 mg total) by mouth daily with breakfast.  3  . furosemide (LASIX) 40 MG tablet Take 40 mg by mouth 2 (two) times daily with breakfast and lunch.     Marland Kitchen HUMALOG KWIKPEN 100 UNIT/ML KiwkPen INJECT 5 UNITS INTO THE SKIN THREE TIMES DAILY BEFORE MEALS 15 mL 12  . Insulin Glargine (LANTUS SOLOSTAR) 100 UNIT/ML Solostar Pen Inject 40 Units into the skin daily.    Marland Kitchen lisinopril (PRINIVIL,ZESTRIL) 40 MG tablet TAKE 1 TABLET BY MOUTH EVERY DAY 90 tablet 3  . Melatonin 10 MG CAPS Take 10 mg by mouth at bedtime as needed (sleep).     Marland Kitchen  metoprolol tartrate (LOPRESSOR) 25 MG tablet TAKE 1/2 TABLET BY MOUTH TWICE DAILY 90 tablet 3  . Multiple Vitamins-Minerals (MULTIVITAMINS THER. W/MINERALS) TABS Take 1 tablet by mouth daily.      . Omega-3 Fatty Acids (FISH OIL) 1000 MG CAPS Take 2,000 mg by mouth 2 (two) times daily.     Marland Kitchen OVER THE COUNTER MEDICATION  Take 1 tablet by mouth 2 (two) times daily. Macular Degenerative Eye Supplement    . pantoprazole (PROTONIX) 40 MG tablet TAKE 1 TABLET BY MOUTH DAILY 90 tablet 3  . PARoxetine (PAXIL) 20 MG tablet Take 20 mg by mouth every morning.  1  . traMADol (ULTRAM) 50 MG tablet TAKE 1 TABLET BY MOUTH FOUR TIMES DAILY 360 tablet 1  . GNP GARLIC EXTRACT PO Take 1 tablet by mouth 2 (two) times daily.      No current facility-administered medications for this visit.    PHYSICAL EXAMINATION: ECOG PERFORMANCE STATUS: 0 - Asymptomatic  Filed Vitals:   09/04/14 1316  BP: 182/67  Pulse: 56  Temp: 98.2 F (36.8 C)  Resp: 18   Filed Weights   09/04/14 1316  Weight: 193 lb 12.8 oz (87.907 kg)    GENERAL:alert, no distress and comfortable SKIN: skin color, texture, turgor are normal, no rashes or significant lesions EYES: normal, Conjunctiva are pink and non-injected, sclera clear OROPHARYNX:no exudate, no erythema and lips, buccal mucosa, and tongue normal  NECK: supple, thyroid normal size, non-tender, without nodularity LYMPH:  no palpable lymphadenopathy in the cervical, axillary or inguinal LUNGS: clear to auscultation and percussion with normal breathing effort HEART: regular rate & rhythm and no murmurs and no lower extremity edema ABDOMEN:abdomen soft, non-tender and normal bowel sounds Musculoskeletal:no cyanosis of digits and no clubbing  NEURO: alert & oriented x 3 with fluent speech, no focal motor/sensory deficits  LABORATORY DATA:  I have reviewed the data as listed    Component Value Date/Time   NA 142 09/04/2014 1259   NA 144 12/24/2013 0828   K 4.7 09/04/2014 1259   K 4.8 12/24/2013 0828   CL 106 12/24/2013 0828   CL 110* 05/28/2012 1259   CO2 27 09/04/2014 1259   CO2 25 12/24/2013 0828   GLUCOSE 56* 09/04/2014 1259   GLUCOSE 97 12/24/2013 0828   GLUCOSE 202* 05/28/2012 1259   BUN 58.1* 09/04/2014 1259   BUN 47* 12/24/2013 0828   CREATININE 2.5* 09/04/2014 1259    CREATININE 2.26* 12/24/2013 0828   CREATININE 2.48* 09/11/2013 1408   CALCIUM 9.4 09/04/2014 1259   CALCIUM 9.2 12/24/2013 0828   PROT 6.0* 09/04/2014 1259   PROT 6.3 01/08/2014 0807   ALBUMIN 3.5 09/04/2014 1259   ALBUMIN 3.5 01/08/2014 0807   AST 28 09/04/2014 1259   AST 19 01/08/2014 0807   ALT 24 09/04/2014 1259   ALT 17 01/08/2014 0807   ALKPHOS 78 09/04/2014 1259   ALKPHOS 68 01/08/2014 0807   BILITOT 0.33 09/04/2014 1259   BILITOT 0.3 01/08/2014 0807   GFRNONAA 21* 12/24/2013 0828   GFRNONAA 32* 09/08/2010 0851   GFRAA 24* 12/24/2013 0828   GFRAA 38* 09/08/2010 0851    No results found for: SPEP, UPEP  Lab Results  Component Value Date   WBC 4.8 09/04/2014   NEUTROABS 2.1 09/04/2014   HGB 11.7 09/04/2014   HCT 36.6 09/04/2014   MCV 94.8 09/04/2014   PLT 243 09/04/2014      Chemistry      Component Value Date/Time   NA  142 09/04/2014 1259   NA 144 12/24/2013 0828   K 4.7 09/04/2014 1259   K 4.8 12/24/2013 0828   CL 106 12/24/2013 0828   CL 110* 05/28/2012 1259   CO2 27 09/04/2014 1259   CO2 25 12/24/2013 0828   BUN 58.1* 09/04/2014 1259   BUN 47* 12/24/2013 0828   CREATININE 2.5* 09/04/2014 1259   CREATININE 2.26* 12/24/2013 0828   CREATININE 2.48* 09/11/2013 1408      Component Value Date/Time   CALCIUM 9.4 09/04/2014 1259   CALCIUM 9.2 12/24/2013 0828   ALKPHOS 78 09/04/2014 1259   ALKPHOS 68 01/08/2014 0807   AST 28 09/04/2014 1259   AST 19 01/08/2014 0807   ALT 24 09/04/2014 1259   ALT 17 01/08/2014 0807   BILITOT 0.33 09/04/2014 1259   BILITOT 0.3 01/08/2014 0807     ASSESSMENT & PLAN:  Chronic lymphocytic leukemia She tolerated treatment very well. Recent imaging studies show no evidence of active cancer. I will see her back in 6 months with history, physical examination and blood work.   Chronic kidney disease, stage III (moderate) Patient has a history of chronic renal insufficiency.  Creatinine is stable.  Will continue to monitor  closely.    Essential hypertension she will continue current medical management. Her blood pressure medications were recently changed. I suspect her blood pressure is high today due to mild anxiety I recommend close follow-up with primary care doctor for medication adjustment.    No orders of the defined types were placed in this encounter.   All questions were answered. The patient knows to call the clinic with any problems, questions or concerns. No barriers to learning was detected. I spent 15 minutes counseling the patient face to face. The total time spent in the appointment was 20 minutes and more than 50% was on counseling and review of test results     [facility], [place], MD 09/04/2014 2:29 PM

## 2014-09-04 NOTE — Assessment & Plan Note (Signed)
She tolerated treatment very well. Recent imaging studies show no evidence of active cancer. I will see her back in 6 months with history, physical examination and blood work.

## 2014-09-04 NOTE — Assessment & Plan Note (Signed)
she will continue current medical management. Her blood pressure medications were recently changed. I suspect her blood pressure is high today due to mild anxiety I recommend close follow-up with primary care doctor for medication adjustment.

## 2014-09-04 NOTE — Assessment & Plan Note (Signed)
Patient has a history of chronic renal insufficiency.  Creatinine is stable.  Will continue to monitor closely.

## 2014-09-04 NOTE — Telephone Encounter (Signed)
per pof to sch pt appt-gave pt copy of avs °

## 2014-09-18 ENCOUNTER — Other Ambulatory Visit: Payer: Self-pay | Admitting: Family Medicine

## 2014-09-23 ENCOUNTER — Ambulatory Visit (INDEPENDENT_AMBULATORY_CARE_PROVIDER_SITE_OTHER): Payer: Medicare Other | Admitting: Cardiovascular Disease

## 2014-09-23 ENCOUNTER — Other Ambulatory Visit: Payer: Self-pay | Admitting: Family Medicine

## 2014-09-23 ENCOUNTER — Encounter: Payer: Self-pay | Admitting: Cardiovascular Disease

## 2014-09-23 VITALS — BP 98/50 | HR 47 | Ht 65.0 in | Wt 192.4 lb

## 2014-09-23 DIAGNOSIS — C9110 Chronic lymphocytic leukemia of B-cell type not having achieved remission: Secondary | ICD-10-CM

## 2014-09-23 DIAGNOSIS — I739 Peripheral vascular disease, unspecified: Secondary | ICD-10-CM

## 2014-09-23 NOTE — Patient Instructions (Signed)
Medication Instructions:  Your physician recommends that you continue on your current medications as directed. Please refer to the Current Medication list given to you today.   Labwork: none  Testing/Procedures: none  Follow-Up: Your physician wants you to follow-up in:  12 months.  You will receive a reminder letter in the mail two months in advance. If you don't receive a letter, please call our office to schedule the follow-up appointment.        

## 2014-09-23 NOTE — Progress Notes (Signed)
Date:  09/23/2014   ID:  Christine Santos, DOB Mar 11, 1945, MRN [id number]  PCP:  Zigmund Gottron, MD  Primary cardiologist: Dr. Irish Lack    History of Present Illness: Christine Santos is a 69 y.o. female who is here today for a follow up visit regarding PAD. She has known history of CAD s/p CABG in 1999. She has multiple chronic medical conditions that include diabetes mellitus, hypertension,  chronic kidney disease with most recent creatinine of 2, CLL  and hyperlipidemia. She has known history of peripheral arterial disease with bilateral lower extremity revascularization. Most recently the right SFA was treated with atherectomy and PTA in December 2014. Previous RSFA stent was patent. Previous LSFA atherectomy site was patent. She reports mild bilateral calf discomfort after walking about 2 blocks. She is more bothered by foot pain both at rest and with activities . she used to get more cramps in her legs at night. She reports improved symptoms over all sense the most recent procedure. She is not a smoker.  Most recent noninvasive evaluation in 08/2013 with normal ABI on right and noncompressible on left.  Duplex showed significant left SFA restenosis. She was treated medically given the lack of significant claudication. She continues to deny claudication on the left side. She received chemotherapy for CLL.  She has chronic kidney disease with most recent creatinine of 2.5.     Wt Readings from Last 3 Encounters:  09/23/14 192 lb 6.4 oz (87.272 kg)  09/04/14 193 lb 12.8 oz (87.907 kg)  07/18/14 189 lb (85.73 kg)     Past Medical History  Diagnosis Date  . CLL (chronic lymphoblastic leukemia)   . Hyperlipidemia   . Hypertension   . Diastolic heart failure   . CAD (coronary artery disease)     s/p CABG in 1999  . Peripheral vascular disease   . Heart murmur   . Myocardial infarction     "I had one but didn't know it" (01/22/2013)  . PAD (peripheral artery disease)   . IDDM  (insulin dependent diabetes mellitus)   . Anemia 08/29/2011  . GERD (gastroesophageal reflux disease)   . H/O hiatal hernia   . Arthritis     "legs" (01/22/2013)    Current Outpatient Prescriptions  Medication Sig Dispense Refill  . allopurinol (ZYLOPRIM) 300 MG tablet TAKE 1/2 TABLET BY MOUTH DAILY 45 tablet 3  . amLODipine (NORVASC) 10 MG tablet Take 20 mg by mouth 2 (two) times daily.    . Ascorbic Acid (VITAMIN C) 100 MG tablet Take 100 mg by mouth daily.    Marland Kitchen aspirin 81 MG tablet Take 81 mg by mouth daily.    Marland Kitchen atorvastatin (LIPITOR) 80 MG tablet TAKE 1 TABLET BY MOUTH DAILY 90 tablet 1  . cloNIDine (CATAPRES) 0.3 MG tablet Take 1 tablet (0.3 mg total) by mouth 2 (two) times daily. 180 tablet 3  . clopidogrel (PLAVIX) 75 MG tablet TAKE 1 TABLET BY MOUTH DAILY 90 tablet 3  . fenofibrate (TRICOR) 48 MG tablet Take 1 tablet (48 mg total) by mouth daily. 30 tablet 11  . ferrous sulfate 325 (65 FE) MG tablet Take 1 tablet (325 mg total) by mouth daily with breakfast.  3  . furosemide (LASIX) 40 MG tablet Take 40 mg by mouth 2 (two) times daily with breakfast and lunch.     Marland Kitchen GNP GARLIC EXTRACT PO Take 1 tablet by mouth 2 (two) times daily.     Marland Kitchen HUMALOG KWIKPEN 100 UNIT/ML KiwkPen  INJECT 5 UNITS INTO THE SKIN THREE TIMES DAILY BEFORE MEALS 15 mL 12  . Insulin Glargine (LANTUS SOLOSTAR) 100 UNIT/ML Solostar Pen Inject 40 Units into the skin daily.    Marland Kitchen lisinopril (PRINIVIL,ZESTRIL) 40 MG tablet TAKE 1 TABLET BY MOUTH EVERY DAY 90 tablet 3  . Melatonin 10 MG CAPS Take 10 mg by mouth at bedtime as needed (sleep).     . metoprolol tartrate (LOPRESSOR) 25 MG tablet TAKE 1/2 TABLET BY MOUTH TWICE DAILY 90 tablet 3  . Multiple Vitamins-Minerals (MULTIVITAMINS THER. W/MINERALS) TABS Take 1 tablet by mouth daily.      . Omega-3 Fatty Acids (FISH OIL) 1000 MG CAPS Take 2,000 mg by mouth 2 (two) times daily.     Marland Kitchen OVER THE COUNTER MEDICATION Take 1 tablet by mouth 2 (two) times daily. Macular  Degenerative Eye Supplement    . pantoprazole (PROTONIX) 40 MG tablet TAKE 1 TABLET BY MOUTH DAILY 90 tablet 3  . PARoxetine (PAXIL) 20 MG tablet Take 20 mg by mouth every morning.  1  . traMADol (ULTRAM) 50 MG tablet TAKE 1 TABLET BY MOUTH FOUR TIMES DAILY 360 tablet 1   No current facility-administered medications for this visit.    Allergies:    Allergies  Allergen Reactions  . Amoxicillin Rash  . Cephalexin Rash  . Codeine Phosphate Rash    Social History:  The patient  reports that she has never smoked. She has never used smokeless tobacco. She reports that she does not drink alcohol or use illicit drugs.   Family History:  The patient's family history includes Heart attack in her mother; Heart disease in her mother; Pneumonia in her father.   ROS:  Please see the history of present illness.  No nausea, vomiting.  No fevers, chills.  No focal weakness.  No dysuria. Leg pain as noted.  All other systems reviewed and negative.   PHYSICAL EXAM: VS:  BP 98/50 mmHg  Pulse 47  Ht 5\' 5"  (1.651 m)  Wt 192 lb 6.4 oz (87.272 kg)  BMI 32.02 kg/m2 Well nourished, well developed, in no acute distress HEENT: normal Neck: no JVD, no carotid bruits Cardiac:  normal S1, S2; RRR;  Lungs:  clear to auscultation bilaterally, no wheezing, rhonchi or rales Abd: soft, nontender, no hepatomegaly Ext: no edema; 1+ left PT pulse; 0 and a + right DP pulse; normal femoral pulse Skin: warm and dry Neuro:   no focal abnormalities noted  EKG:  Sinus bradycardia, T wave inversion laterally.  No change.     ASSESSMENT AND PLAN:  1. Coronary atherosclerosis of native coronary artery  stable s/p CABG in 1996.   2. Atherosclerosis of native arteries of the extremities with intermittent claudication   she had atherectomy and balloon angioplasty of the right SFA in December of 2014. Previous atherectomy of the distal left SFA a few years ago. There is evidence of focal restenosis in left mid SFA.   However, she continues to deny claudication. Thus, I recommend continued medical therapy..  I asked her to notify me if symptoms worsen.  The patient has advanced chronic kidney disease and thus angiography should be reserved for severe symptomatic disease or critical limb ischemia.  3. Essential hypertension: Blood pressure is slightly low today. However, she reports that her blood pressure recently was 160/90 at Dr. Abel Presto office. Thus, I made no changes in her medications.  4. Hyperlipidemia: I recommend continuing high dose atorvastatin with a target LDL of less than  12.   Signed, Kathlyn Sacramento, MD, [facility] 09/23/2014 10:41 AM

## 2014-09-24 ENCOUNTER — Ambulatory Visit
Admission: RE | Admit: 2014-09-24 | Discharge: 2014-09-24 | Disposition: A | Discharge status: Home / Self Care | Payer: Medicare Other | Source: Ambulatory Visit | Attending: Family Medicine | Admitting: Family Medicine

## 2014-09-24 DIAGNOSIS — C9110 Chronic lymphocytic leukemia of B-cell type not having achieved remission: Secondary | ICD-10-CM

## 2014-09-30 ENCOUNTER — Telehealth: Payer: Self-pay | Admitting: Interventional Cardiology

## 2014-09-30 ENCOUNTER — Other Ambulatory Visit (HOSPITAL_COMMUNITY): Payer: Self-pay | Admitting: Nephrology

## 2014-09-30 DIAGNOSIS — C9110 Chronic lymphocytic leukemia of B-cell type not having achieved remission: Secondary | ICD-10-CM

## 2014-09-30 NOTE — Telephone Encounter (Signed)
New message   Trying to get pt scheduled for renal biopsy   Request for surgical clearance:  1. What type of surgery is being performed? Renal biopsy  2. When is this surgery scheduled? Not scheduled at this time   3. Are there any medications that need to be held prior to surgery and how long?Plavix 5 days  4. Name of physician performing surgery? Not sure at this time  5. What is your office phone and fax number? [facility]   [phone]    Fax [phone]

## 2014-09-30 NOTE — Telephone Encounter (Signed)
Will forward to Dr. Varanasi for review and advisement.  

## 2014-09-30 NOTE — Telephone Encounter (Signed)
Clearance faxed

## 2014-09-30 NOTE — Telephone Encounter (Signed)
OK to hold Plavix for 5 days prior to renal biopsy.

## 2014-10-01 NOTE — Telephone Encounter (Signed)
Spoke with pt and informed her ok to hold Plavix 5 days prior to biopsy. Pt verbalized understanding and was in agreement with this plan.

## 2014-10-08 ENCOUNTER — Other Ambulatory Visit: Payer: Self-pay | Admitting: Cardiovascular Disease

## 2014-10-10 ENCOUNTER — Other Ambulatory Visit: Payer: Self-pay | Admitting: Radiology

## 2014-10-10 ENCOUNTER — Telehealth: Payer: Self-pay | Admitting: Family Medicine

## 2014-10-10 NOTE — Telephone Encounter (Signed)
Pt called because the pharmacy is faxing [place] for her medication and we are not responding. She needs a refill on her Lasix. jw

## 2014-10-12 ENCOUNTER — Other Ambulatory Visit: Payer: Self-pay | Admitting: Radiology

## 2014-10-13 ENCOUNTER — Encounter (HOSPITAL_COMMUNITY): Payer: Self-pay

## 2014-10-13 ENCOUNTER — Ambulatory Visit (HOSPITAL_COMMUNITY)
Admission: RE | Admit: 2014-10-13 | Discharge: 2014-10-13 | Disposition: A | Discharge status: Home / Self Care | Payer: Medicare Other | Source: Ambulatory Visit | Attending: Nephrology | Admitting: Nephrology

## 2014-10-13 ENCOUNTER — Other Ambulatory Visit: Payer: Self-pay | Admitting: *Deleted

## 2014-10-13 DIAGNOSIS — Z7982 Long term (current) use of aspirin: Secondary | ICD-10-CM | POA: Insufficient documentation

## 2014-10-13 DIAGNOSIS — Z7902 Long term (current) use of antithrombotics/antiplatelets: Secondary | ICD-10-CM | POA: Insufficient documentation

## 2014-10-13 DIAGNOSIS — I129 Hypertensive chronic kidney disease with stage 1 through stage 4 chronic kidney disease, or unspecified chronic kidney disease: Secondary | ICD-10-CM | POA: Diagnosis present

## 2014-10-13 DIAGNOSIS — K219 Gastro-esophageal reflux disease without esophagitis: Secondary | ICD-10-CM | POA: Diagnosis not present

## 2014-10-13 DIAGNOSIS — Z9221 Personal history of antineoplastic chemotherapy: Secondary | ICD-10-CM | POA: Insufficient documentation

## 2014-10-13 DIAGNOSIS — C9110 Chronic lymphocytic leukemia of B-cell type not having achieved remission: Secondary | ICD-10-CM | POA: Diagnosis not present

## 2014-10-13 DIAGNOSIS — E1122 Type 2 diabetes mellitus with diabetic chronic kidney disease: Secondary | ICD-10-CM | POA: Diagnosis not present

## 2014-10-13 DIAGNOSIS — I2510 Atherosclerotic heart disease of native coronary artery without angina pectoris: Secondary | ICD-10-CM | POA: Diagnosis not present

## 2014-10-13 DIAGNOSIS — I739 Peripheral vascular disease, unspecified: Secondary | ICD-10-CM | POA: Insufficient documentation

## 2014-10-13 DIAGNOSIS — Z79899 Other long term (current) drug therapy: Secondary | ICD-10-CM | POA: Diagnosis not present

## 2014-10-13 DIAGNOSIS — Z951 Presence of aortocoronary bypass graft: Secondary | ICD-10-CM | POA: Insufficient documentation

## 2014-10-13 DIAGNOSIS — E785 Hyperlipidemia, unspecified: Secondary | ICD-10-CM | POA: Diagnosis not present

## 2014-10-13 DIAGNOSIS — I5032 Chronic diastolic (congestive) heart failure: Secondary | ICD-10-CM | POA: Diagnosis not present

## 2014-10-13 DIAGNOSIS — N183 Chronic kidney disease, stage 3 (moderate): Secondary | ICD-10-CM | POA: Diagnosis not present

## 2014-10-13 DIAGNOSIS — I252 Old myocardial infarction: Secondary | ICD-10-CM | POA: Diagnosis not present

## 2014-10-13 DIAGNOSIS — N189 Chronic kidney disease, unspecified: Secondary | ICD-10-CM | POA: Insufficient documentation

## 2014-10-13 DIAGNOSIS — Z794 Long term (current) use of insulin: Secondary | ICD-10-CM | POA: Insufficient documentation

## 2014-10-13 LAB — CBC
HCT: 37.3 % (ref 36.0–46.0)
Hemoglobin: 11.9 g/dL — ABNORMAL LOW (ref 12.0–15.0)
MCH: 30.4 pg (ref 26.0–34.0)
MCHC: 31.9 g/dL (ref 30.0–36.0)
MCV: 95.4 fL (ref 78.0–100.0)
PLATELETS: 267 10*3/uL (ref 150–400)
RBC: 3.91 MIL/uL (ref 3.87–5.11)
RDW: 14.8 % (ref 11.5–15.5)
WBC: 6.6 10*3/uL (ref 4.0–10.5)

## 2014-10-13 LAB — GLUCOSE, CAPILLARY
GLUCOSE-CAPILLARY: 106 mg/dL — AB (ref 65–99)
Glucose-Capillary: 132 mg/dL — ABNORMAL HIGH (ref 65–99)

## 2014-10-13 LAB — APTT: aPTT: 25 seconds (ref 24–37)

## 2014-10-13 LAB — PROTIME-INR
INR: 1 (ref 0.00–1.49)
Prothrombin Time: 13.4 seconds (ref 11.6–15.2)

## 2014-10-13 MED ORDER — FENTANYL CITRATE (PF) 100 MCG/2ML IJ SOLN
INTRAMUSCULAR | Status: AC | PRN
  Administered 2014-10-13 (×2): 50 ug via INTRAVENOUS

## 2014-10-13 MED ORDER — FUROSEMIDE 40 MG PO TABS: 40.0000 mg | ORAL_TABLET | Freq: Two times a day (BID) | ORAL | Status: DC

## 2014-10-13 MED ORDER — GELATIN ABSORBABLE 12-7 MM EX MISC
CUTANEOUS | Status: AC
  Filled 2014-10-13: qty 1

## 2014-10-13 MED ORDER — MIDAZOLAM HCL 2 MG/2ML IJ SOLN
INTRAMUSCULAR | Status: AC
  Filled 2014-10-13: qty 2

## 2014-10-13 MED ORDER — SODIUM CHLORIDE 0.9 % IV SOLN
Freq: Once | INTRAVENOUS | Status: AC
  Administered 2014-10-13: 10:00:00 via INTRAVENOUS

## 2014-10-13 MED ORDER — MIDAZOLAM HCL 2 MG/2ML IJ SOLN
INTRAMUSCULAR | Status: AC | PRN
  Administered 2014-10-13 (×2): 1 mg via INTRAVENOUS

## 2014-10-13 MED ORDER — LIDOCAINE HCL (PF) 1 % IJ SOLN
INTRAMUSCULAR | Status: AC
  Filled 2014-10-13: qty 10

## 2014-10-13 MED ORDER — FENTANYL CITRATE (PF) 100 MCG/2ML IJ SOLN
INTRAMUSCULAR | Status: AC
  Filled 2014-10-13: qty 2

## 2014-10-13 NOTE — Sedation Documentation (Addendum)
Time out done at this time. Epic shut down and incorrectly documented the time out.  Roselyn Reef Covington,RN

## 2014-10-13 NOTE — Telephone Encounter (Signed)
Refilled earlier today via electronic refill request.

## 2014-10-13 NOTE — Sedation Documentation (Addendum)
Procedure on hold for the moment while order for procedure is verified by Dr. Jacqualyn Posey. Will continue to monitor. Roselyn Reef Covington,RN

## 2014-10-13 NOTE — Discharge Instructions (Signed)
Kidney Biopsy, Care After Refer to this sheet in the next few weeks. These instructions provide you with information on caring for yourself after your procedure. Your health care provider may also give you more specific instructions. Your treatment has been planned according to current medical practices, but problems sometimes occur. Call your health care provider if you have any problems or questions after your procedure.  WHAT TO EXPECT AFTER THE PROCEDURE   You may notice blood in the urine for the first 24 hours after the biopsy.  You may feel some pain at the biopsy site for 1-2 weeks after the biopsy. HOME CARE INSTRUCTIONS  Do not lift anything heavier than 10 lb (4.5 kg) for 2 weeks.  Do not take any non-steroidal anti-inflammatory drugs (NSAIDs) or any blood thinners for a week after the biopsy unless instructed to do so by your health care provider.  Only take medicines for pain, fever, or discomfort as directed by your health care provider. SEEK MEDICAL CARE IF:  You have bloody urine more than 24 hours after the biopsy.   You develop a fever.   You cannot urinate.   You have increasing pain at the biopsy site.  SEEK IMMEDIATE MEDICAL CARE IF: You feel faint or dizzy.  Document Released: 09/12/2012 Document Reviewed: 09/12/2012 [facility] Patient Information 2015 [place]. This information is not intended to replace advice given to you by your health care provider. Make sure you discuss any questions you have with your health care provider.

## 2014-10-13 NOTE — H&P (Signed)
Chief Complaint: Patient was seen in consultation today for chronic kidney disease at the request of Jakin C  Referring Physician(s): Powell,Alvin C  History of Present Illness: Christine Santos is a 69 y.o. female with history of CLL s/p chemotherapy and chronic kidney disease. She has been seen by Nephrology and is scheduled today for image guided renal biopsy. She denies any chest pain, shortness of breath or palpitations. She denies any active signs of bleeding or excessive bruising. She denies any recent fever or chills. The patient denies any history of sleep apnea or chronic oxygen use. She has previously tolerated sedation without complications. She denies any current flank pain or nausea.    Past Medical History  Diagnosis Date  . CLL (chronic lymphoblastic leukemia)   . Hyperlipidemia   . Hypertension   . Diastolic heart failure   . CAD (coronary artery disease)     s/p CABG in 1999  . Peripheral vascular disease   . Heart murmur   . Myocardial infarction     "I had one but didn't know it" (01/22/2013)  . PAD (peripheral artery disease)   . IDDM (insulin dependent diabetes mellitus)   . Anemia 08/29/2011  . GERD (gastroesophageal reflux disease)   . H/O hiatal hernia   . Arthritis     "legs" (01/22/2013)    Past Surgical History  Procedure Laterality Date  . Cataract extraction w/ intraocular lens  implant, bilateral Bilateral ?2011  . Heel spur excision Bilateral 1970's  . Cholecystectomy  1993  . Tubal ligation  1980's  . Cardiac catheterization      "I've had 2-4" (01/22/2013)  . Coronary artery bypass graft  10/1994    "CABG X3"  . Shoulder open rotator cuff repair Bilateral 1990's    "2 times on 1 side; once on the other"  . Angioplasty / stenting iliac      [name] 11/25/2010 (01/22/2013)  . Angioplasty / stenting femoral Right 09/2010    SFA/notes 11/25/2010 (01/22/2013)  . Angioplasty / stenting femoral Left 11/2010    SFA/notes 11/25/2010  (01/22/2013)  . Transluminal atherectomy femoral artery Right 01/22/2013    & balloon  . Lower extremity angiogram N/A 01/22/2013    Procedure: LOWER EXTREMITY ANGIOGRAM;  Surgeon: [name], MD;  Location[facility] CATH LAB;  Service: Cardiovascular;  Laterality: N/A;    Allergies: Amoxicillin; Cephalexin; and Codeine phosphate  Medications: Prior to Admission medications   Medication Sig Start Date End Date Taking? Authorizing Ambers Iyengar  allopurinol (ZYLOPRIM) 300 MG tablet TAKE 1/2 TABLET BY MOUTH DAILY Patient taking differently: TAKE 150 MG BY MOUTH DAILY 04/03/14  Yes [name], MD  amLODipine (NORVASC) 10 MG tablet Take 20 mg by mouth 2 (two) times daily.   Yes Historical Tava Peery, MD  Ascorbic Acid (VITAMIN C) 100 MG tablet Take 100 mg by mouth daily.   Yes Historical Bobbi Yount, MD  aspirin 81 MG tablet Take 81 mg by mouth daily.   Yes Historical Cris Gibby, MD  atorvastatin (LIPITOR) 80 MG tablet TAKE 1 TABLET BY MOUTH DAILY Patient taking differently: TAKE 80 MG BY MOUTH DAILY 10/08/14  Yes Minna Merritts, MD  cloNIDine (CATAPRES) 0.3 MG tablet Take 1 tablet (0.3 mg total) by mouth 2 (two) times daily. 07/18/14  Yes [name], MD  clopidogrel (PLAVIX) 75 MG tablet TAKE 1 TABLET BY MOUTH DAILY Patient taking differently: TAKE 75 MG BY MOUTH DAILY 05/26/14  Yes [name], MD  fenofibrate ([place])  48 MG tablet Take 1 tablet (48 mg total) by mouth daily. 05/13/14  Yes [name], MD  ferrous sulfate 325 (65 FE) MG tablet Take 1 tablet (325 mg total) by mouth daily with breakfast. 05/23/13  Yes [name], MD  furosemide (LASIX) 40 MG tablet Take 40 mg by mouth 2 (two) times daily with breakfast and lunch.  09/11/13  Yes [name], MD  GNP GARLIC EXTRACT PO Take 1 tablet by mouth daily.    Yes Historical Andrey Mccaskill, MD  HUMALOG KWIKPEN 100 UNIT/ML KiwkPen INJECT 5 UNITS INTO THE SKIN THREE TIMES DAILY BEFORE MEALS 08/15/14  Yes [name], MD    Insulin Glargine (LANTUS SOLOSTAR) 100 UNIT/ML Solostar Pen Inject 40 Units into the skin daily.   Yes Historical Coran Dipaola, MD  lisinopril (PRINIVIL,ZESTRIL) 40 MG tablet TAKE 1 TABLET BY MOUTH EVERY DAY Patient taking differently: TAKE 40 MG BY MOUTH EVERY DAY 04/03/14  Yes [name], MD  Melatonin 10 MG CAPS Take 10 mg by mouth at bedtime as needed (sleep).    Yes Historical Sinda Leedom, MD  metoprolol tartrate (LOPRESSOR) 25 MG tablet TAKE 1/2 TABLET BY MOUTH TWICE DAILY Patient taking differently: TAKE 12.5 MG BY MOUTH TWICE DAILY 04/03/14  Yes [name], MD  Multiple Vitamins-Minerals (MULTIVITAMINS THER. W/MINERALS) TABS Take 1 tablet by mouth daily.     Yes Historical Serenitie Vinton, MD  Omega-3 Fatty Acids (FISH OIL) 1000 MG CAPS Take 2,000 mg by mouth 2 (two) times daily.    Yes Historical Teria Khachatryan, MD  OVER THE COUNTER MEDICATION Take 1 tablet by mouth 2 (two) times daily. Macular Degenerative Eye Supplement   Yes Historical Nasha Diss, MD  pantoprazole (PROTONIX) 40 MG tablet TAKE 1 TABLET BY MOUTH DAILY Patient taking differently: TAKE 40 MG BY MOUTH DAILY 04/03/14  Yes [name], MD  PARoxetine (PAXIL) 20 MG tablet Take 20 mg by mouth every morning. 06/12/14  Yes Historical Amanat Hackel, MD  traMADol (ULTRAM) 50 MG tablet TAKE 1 TABLET BY MOUTH FOUR TIMES DAILY Patient taking differently: TAKE 50 MG BY MOUTH FOUR TIMES DAILY 09/22/14  Yes [name], MD     Family History  Problem Relation Age of Onset  . Heart disease Mother   . Heart attack Mother   . Pneumonia Father     Social History   Social History  . Marital Status: Divorced    Spouse Name: N/A  . Number of Children: 2  . Years of Education: 12   Occupational History  . DISABILITY Other    Worked at [employer] Topics  . Smoking status: Never Smoker   . Smokeless tobacco: Never Used  . Alcohol Use: No  . Drug Use: No  . Sexual Activity: Not Currently   Other Topics Concern   . None   Social History Narrative   Health Care POA:    Emergency Contact: sister, [name] (h) [phone]   End of Life Plan:    Who lives with you: female friend   Any pets: none   Diet: Pt has a varied diet of protein, starch, vegetables   Exercise: Pt does not have any regular exercise routine.   Seatbelts: Pt reports wearing seatbelt when in vehicles.    Sun Exposure/Protection: Pt reports using sun screen.   Hobbies: going to lake, biking, blue grass music          Review of Systems: A 12 point ROS discussed  and pertinent positives are indicated in the HPI above.  All other systems are negative.  Review of Systems  Vital Signs: BP 165/57 mmHg  Pulse 52  Temp(Src) 98 F (36.7 C) (Oral)  Resp 18  Ht 5\' 6"  (1.676 m)  Wt 190 lb (86.183 kg)  BMI 30.68 kg/m2  SpO2 93%  Physical Exam  Constitutional: She is oriented to person, place, and time. No distress.  HENT:  Head: Normocephalic and atraumatic.  Neck: No tracheal deviation present.  Cardiovascular: Normal rate and regular rhythm.  Exam reveals no gallop and no friction rub.   No murmur heard. Pulmonary/Chest: Effort normal and breath sounds normal. No respiratory distress. She has no wheezes. She has no rales.  Abdominal: Soft. Bowel sounds are normal. She exhibits no distension. There is no tenderness.  Neurological: She is alert and oriented to person, place, and time.  Skin: Skin is warm and dry. She is not diaphoretic.    Mallampati Score:  MD Evaluation Airway: WNL Heart: WNL Abdomen: WNL Chest/ Lungs: WNL ASA  Classification: 2 Mallampati/Airway Score: Two  Imaging: US Renal  09/24/2014   CLINICAL DATA:  CLL, chronic kidney disease stage 3. Lymphocytic involvement of the kidneys, receiving chemotherapy. History of previous renal biopsy.  EXAM: RENAL / URINARY TRACT ULTRASOUND COMPLETE  COMPARISON:  12/03/2013.  FINDINGS: Right Kidney:  Length: 12.4 cm. Parenchymal echogenicity is increased.  There are several anechoic or nearly anechoic lesions within the right kidney. A 1.6 x 1.5 x 1.9 cm anechoic lesion has an internal 10 mm echogenic component. No hydronephrosis.  Left Kidney:  Length: 13 polyp 4 cm. Parenchymal echogenicity is increased. Scattered anechoic lesions with increased through transmission measure up to 1.9 x 1.9 x 2.1 cm. No hydronephrosis.  Bladder:  There may be echogenic debris dependently in the bladder.  IMPRESSION: 1. Complex cystic lesion with an internal mural nodule in the right kidney. Further evaluation with pre and post contrast MRI or CT should be considered. MRI is preferred in younger patients (due to lack of ionizing radiation) and for evaluating calcified lesion(s). 2. Bilateral renal cysts. 3. Increased renal parenchymal echogenicity is in keeping with chronic medical renal disease.   Electronically Signed   By: Lorin Picket M.D.   On: 09/24/2014 14:56    Labs:  CBC:  Recent Labs  06/04/14 0853 07/02/14 0938 09/04/14 1259 10/13/14 0930  WBC 4.8 5.7 4.8 6.6  HGB 11.4* 11.0* 11.7 11.9*  HCT 35.8 35.2* 36.6 37.3  PLT 174 204 243 267    COAGS:  Recent Labs  12/24/13 0828 01/10/14 1025 07/02/14 0938 10/13/14 0930  INR 1.03 0.95 0.97 1.00  APTT 27  --   --  25    BMP:  Recent Labs  12/24/13 0828  04/09/14 0856 05/07/14 0825 06/04/14 0853 09/04/14 1259  NA 144  < > 145 145 142 142  K 4.8  < > 4.6 4.6 4.4 4.7  CL 106  --   --   --   --   --   CO2 25  < > 26 23 23 27   GLUCOSE 97  < > 69* 56* 119 56*  BUN 47*  < > 51.2* 62.9* 57.6* 58.1*  CALCIUM 9.2  < > 8.9 8.8 8.8 9.4  CREATININE 2.26*  < > 2.4* 2.9* 2.5* 2.5*  GFRNONAA 21*  --   --   --   --   --   GFRAA 24*  --   --   --   --   --   < > =  values in this interval not displayed.  LIVER FUNCTION TESTS:  Recent Labs  04/09/14 0856 05/07/14 0825 06/04/14 0853 09/04/14 1259  BILITOT 0.29 0.22 0.25 0.33  AST 32 30 31 28   ALT 23 19 22 24   ALKPHOS 56 81 81 78  PROT  5.7* 5.7* 5.6* 6.0*  ALBUMIN 3.3* 3.3* 3.2* 3.5    Assessment and Plan: CLL s/p chemotherapy Chronic kidney diease  Seen by Nephrology Scheduled today for image guided renal biopsy with sedation The patient has been NPO, no blood thinners taken, labs and vitals have been reviewed. Risks and Benefits discussed with the patient including, but not limited to bleeding, infection, damage to adjacent structures or low yield requiring additional tests. All of the patient's questions were answered, patient is agreeable to proceed. Consent signed and in chart. CAD s/p CABG on Plavix-last dose 5 days ago PAD History of MI   Thank you for this interesting consult.  I greatly enjoyed meeting TONNI HACKENBURG and look forward to participating in their care.  A copy of this report was sent to the requesting Adalynd Donahoe on this date.  SignedHedy Jacob 10/13/2014, 9:52 AM   I spent a total of 30 Minutes in face to face in clinical consultation, greater than 50% of which was counseling/coordinating care for chronic kidney disease and history of CLL.

## 2014-10-13 NOTE — Procedures (Addendum)
Interventional Radiology Procedure Note  Procedure:  US guided left medical renal biopsy. 3 x 16G core.  Discussed medical renal indication with Dr. [name] before the case, confirming medical renal indication.  Not a right targeted.  Complications: None Recommendations:  - Ok to shower tomorrow - Do not submerge for 7 days - Routine care   Signed,  Dulcy Fanny. Earleen Newport, DO

## 2014-10-20 ENCOUNTER — Encounter (HOSPITAL_COMMUNITY): Payer: Self-pay

## 2014-10-21 ENCOUNTER — Encounter (HOSPITAL_COMMUNITY): Payer: Self-pay

## 2014-10-23 ENCOUNTER — Ambulatory Visit (INDEPENDENT_AMBULATORY_CARE_PROVIDER_SITE_OTHER): Payer: Medicare Other | Admitting: Family Medicine

## 2014-10-23 VITALS — BP 190/59 | HR 54 | Temp 97.8°F | Wt 191.4 lb

## 2014-10-23 DIAGNOSIS — I739 Peripheral vascular disease, unspecified: Secondary | ICD-10-CM

## 2014-10-23 DIAGNOSIS — Z23 Encounter for immunization: Secondary | ICD-10-CM | POA: Diagnosis not present

## 2014-10-23 DIAGNOSIS — I10 Essential (primary) hypertension: Secondary | ICD-10-CM

## 2014-10-23 DIAGNOSIS — E1140 Type 2 diabetes mellitus with diabetic neuropathy, unspecified: Secondary | ICD-10-CM

## 2014-10-23 DIAGNOSIS — E118 Type 2 diabetes mellitus with unspecified complications: Secondary | ICD-10-CM

## 2014-10-23 DIAGNOSIS — I5022 Chronic systolic (congestive) heart failure: Secondary | ICD-10-CM

## 2014-10-23 DIAGNOSIS — E1149 Type 2 diabetes mellitus with other diabetic neurological complication: Secondary | ICD-10-CM

## 2014-10-23 DIAGNOSIS — E1142 Type 2 diabetes mellitus with diabetic polyneuropathy: Secondary | ICD-10-CM

## 2014-10-23 LAB — POCT GLYCOSYLATED HEMOGLOBIN (HGB A1C): Hemoglobin A1C: 6.3

## 2014-10-23 MED ORDER — AMLODIPINE BESYLATE 10 MG PO TABS: 10.0000 mg | ORAL_TABLET | Freq: Every day | ORAL | Status: DC

## 2014-10-23 NOTE — Patient Instructions (Addendum)
Make sure you make an appointment for a mammogram Your A1C is great You need to take 10 mg a day of amlodipine. OK to use up your current 5mg  amlodipine by taking two pills per day. Your last cholesterol was 01/08/2014.  Try to wait one year for insurance reasons. Please take your home blood pressure on the higher dose of amlodipine and call me after you have 4-6 readings.

## 2014-10-24 ENCOUNTER — Encounter: Payer: Self-pay | Admitting: Family Medicine

## 2014-10-24 NOTE — Assessment & Plan Note (Signed)
Asymptomatic and not causing foot problems

## 2014-10-24 NOTE — Assessment & Plan Note (Signed)
Stable, no leg swelling or SOB.  I am concerned about the BP strain on heart.

## 2014-10-24 NOTE — Assessment & Plan Note (Signed)
Moderate and not causing foot problems

## 2014-10-24 NOTE — Progress Notes (Signed)
   Subjective:    Patient ID: Christine Santos, female    DOB: [birth year], 69 y.o.   MRN: [id number]  HPI Here for recheck mult problems HBP - BP up.  I am not sure how this happened but her chart indicates amlodipine 20 mg BID - clearly a dose that is high and incorrect.  She brings in a bottle and it is 5 mg daily, which is what she has been taking.  Denies HA, sob and chest pain DM.  Home blood sugars have been fine.  Needs flu shot.    Review of Systems     Objective:   Physical ExamDM foot exam done again.  I am not sure why shows up as due BP elevation confirmed. Lungs clear Cardiac RRR without m or g         Assessment & Plan:

## 2014-10-24 NOTE — Assessment & Plan Note (Signed)
Control is great.

## 2014-10-27 ENCOUNTER — Other Ambulatory Visit: Payer: Self-pay

## 2014-10-27 DIAGNOSIS — Z1231 Encounter for screening mammogram for malignant neoplasm of breast: Secondary | ICD-10-CM

## 2014-10-30 ENCOUNTER — Ambulatory Visit
Admission: RE | Admit: 2014-10-30 | Discharge: 2014-10-30 | Disposition: A | Discharge status: Home / Self Care | Payer: Medicare Other | Source: Ambulatory Visit

## 2014-10-30 DIAGNOSIS — Z1231 Encounter for screening mammogram for malignant neoplasm of breast: Secondary | ICD-10-CM

## 2014-12-01 ENCOUNTER — Other Ambulatory Visit: Payer: Self-pay | Admitting: Family Medicine

## 2014-12-10 ENCOUNTER — Other Ambulatory Visit: Payer: Self-pay | Admitting: Family Medicine

## 2014-12-26 ENCOUNTER — Other Ambulatory Visit: Payer: Self-pay | Admitting: Family Medicine

## 2014-12-29 ENCOUNTER — Other Ambulatory Visit: Payer: Medicare Other | Admitting: *Deleted

## 2014-12-29 DIAGNOSIS — E7800 Pure hypercholesterolemia, unspecified: Secondary | ICD-10-CM

## 2014-12-29 LAB — LIPID PANEL
Cholesterol: 150 mg/dL (ref 125–200)
HDL: 37 mg/dL — ABNORMAL LOW (ref >=46)
LDL CALC: 83 mg/dL (ref <130)
Total CHOL/HDL Ratio: 4.1 Ratio (ref <=5.0)
Triglycerides: 152 mg/dL — ABNORMAL HIGH (ref <150)
VLDL: 30 mg/dL (ref <30)

## 2014-12-29 LAB — HEPATIC FUNCTION PANEL
ALBUMIN: 3.8 g/dL (ref 3.6–5.1)
ALK PHOS: 64 U/L (ref 33–130)
ALT: 21 U/L (ref 6–29)
AST: 34 U/L (ref 10–35)
BILIRUBIN INDIRECT: 0.3 mg/dL (ref 0.2–1.2)
Bilirubin, Direct: 0.1 mg/dL (ref <=0.2)
TOTAL PROTEIN: 6.1 g/dL (ref 6.1–8.1)
Total Bilirubin: 0.4 mg/dL (ref 0.2–1.2)

## 2015-01-25 DIAGNOSIS — I639 Cerebral infarction, unspecified: Secondary | ICD-10-CM

## 2015-01-25 HISTORY — DX: Cerebral infarction, unspecified: I63.9

## 2015-02-12 ENCOUNTER — Telehealth: Payer: Self-pay | Admitting: *Deleted

## 2015-02-12 NOTE — Telephone Encounter (Signed)
Called patient to offer flu vaccine, however, VM picked up.  Left message on patient's voice mail to return call. DUCATTE, LAURENZE L, RN   

## 2015-02-13 NOTE — Telephone Encounter (Signed)
Pt called back. She has already received the flu shot

## 2015-02-19 NOTE — Telephone Encounter (Signed)
Confirmed with patient that flu vaccine was given at appt with PCP on 10/23/2014 Added to historical immunization record Jahron Hunsinger, Orvis Brill, RN

## 2015-03-10 ENCOUNTER — Ambulatory Visit (HOSPITAL_BASED_OUTPATIENT_CLINIC_OR_DEPARTMENT_OTHER): Payer: Medicare Other | Admitting: Hematology and Oncology

## 2015-03-10 ENCOUNTER — Encounter: Payer: Self-pay | Admitting: Hematology and Oncology

## 2015-03-10 ENCOUNTER — Telehealth: Payer: Self-pay | Admitting: Hematology and Oncology

## 2015-03-10 ENCOUNTER — Other Ambulatory Visit (HOSPITAL_BASED_OUTPATIENT_CLINIC_OR_DEPARTMENT_OTHER): Payer: Medicare Other

## 2015-03-10 VITALS — BP 133/51 | HR 58 | Temp 97.5°F | Resp 18 | Ht 66.0 in | Wt 188.5 lb

## 2015-03-10 DIAGNOSIS — C9110 Chronic lymphocytic leukemia of B-cell type not having achieved remission: Secondary | ICD-10-CM

## 2015-03-10 DIAGNOSIS — D630 Anemia in neoplastic disease: Secondary | ICD-10-CM

## 2015-03-10 DIAGNOSIS — N184 Chronic kidney disease, stage 4 (severe): Secondary | ICD-10-CM | POA: Diagnosis not present

## 2015-03-10 DIAGNOSIS — D631 Anemia in chronic kidney disease: Secondary | ICD-10-CM | POA: Diagnosis not present

## 2015-03-10 DIAGNOSIS — N189 Chronic kidney disease, unspecified: Secondary | ICD-10-CM

## 2015-03-10 LAB — COMPREHENSIVE METABOLIC PANEL
ALBUMIN: 3.1 g/dL — AB (ref 3.5–5.0)
ALK PHOS: 79 U/L (ref 40–150)
ALT: 24 U/L (ref 0–55)
ANION GAP: 12 meq/L — AB (ref 3–11)
AST: 34 U/L (ref 5–34)
BILIRUBIN TOTAL: 0.33 mg/dL (ref 0.20–1.20)
BUN: 67.6 mg/dL — ABNORMAL HIGH (ref 7.0–26.0)
CO2: 26 meq/L (ref 22–29)
CREATININE: 3 mg/dL — AB (ref 0.6–1.1)
Calcium: 9.1 mg/dL (ref 8.4–10.4)
Chloride: 104 mEq/L (ref 98–109)
EGFR: 15 mL/min/{1.73_m2} — AB (ref >90)
GLUCOSE: 143 mg/dL — AB (ref 70–140)
Potassium: 4.3 mEq/L (ref 3.5–5.1)
Sodium: 142 mEq/L (ref 136–145)
TOTAL PROTEIN: 6 g/dL — AB (ref 6.4–8.3)

## 2015-03-10 LAB — CBC WITH DIFFERENTIAL/PLATELET
BASO%: 0.3 % (ref 0.0–2.0)
BASOS ABS: 0 10*3/uL (ref 0.0–0.1)
EOS ABS: 0.3 10*3/uL (ref 0.0–0.5)
EOS%: 3.9 % (ref 0.0–7.0)
HCT: 31.8 % — ABNORMAL LOW (ref 34.8–46.6)
HEMOGLOBIN: 10.2 g/dL — AB (ref 11.6–15.9)
LYMPH%: 21.4 % (ref 14.0–49.7)
MCH: 30.9 pg (ref 25.1–34.0)
MCHC: 32.1 g/dL (ref 31.5–36.0)
MCV: 96.4 fL (ref 79.5–101.0)
MONO#: 0.7 10*3/uL (ref 0.1–0.9)
MONO%: 9.2 % (ref 0.0–14.0)
NEUT#: 5 10*3/uL (ref 1.5–6.5)
NEUT%: 65.2 % (ref 38.4–76.8)
PLATELETS: 290 10*3/uL (ref 145–400)
RBC: 3.3 10*6/uL — AB (ref 3.70–5.45)
RDW: 15.7 % — ABNORMAL HIGH (ref 11.2–14.5)
WBC: 7.7 10*3/uL (ref 3.9–10.3)
lymph#: 1.7 10*3/uL (ref 0.9–3.3)

## 2015-03-10 NOTE — Assessment & Plan Note (Signed)
This is likely anemia of chronic disease. The patient denies recent history of bleeding such as epistaxis, hematuria or hematochezia. She is asymptomatic from the anemia. We will observe for now.  

## 2015-03-10 NOTE — Progress Notes (Signed)
[facility] OFFICE PROGRESS NOTE  Patient Care Team: [name], MD as PCP - General Monna Fam, MD (Ophthalmology) [name], MD as Consulting Physician (Cardiology) Hurman Horn, MD (Ophthalmology) Heath Lark, MD as Consulting Physician (Hematology and Oncology) Estanislado Emms, MD as Consulting Physician (Nephrology)  SUMMARY OF ONCOLOGIC HISTORY:   Chronic lymphocytic leukemia ([place])   10/13/2006 Initial Diagnosis Chronic lymphocytic leukemia   12/24/2013 Procedure Kidney biopsy showed renal involvement of CLL, with associated diabetic gromerulosclerosis, moderate to severe arterionephrosclerosis   01/10/2014 Imaging PET/CT scan showed no evidence of metastatic disease   01/10/2014 Procedure She has placement of port   01/14/2014 Pathology Results FISH from peripheral blood showed deletion 13 q   01/15/2014 - 06/04/2014 Chemotherapy She received Obinutuzumab along with chlorambucil   04/07/2014 Imaging PET CT scan is negative for disease    INTERVAL HISTORY: Please see below for problem oriented charting.  she returns for further follow-up. She feels well. Denies recent infection.  appetite is stable, denies recent weight loss.  No new lymphadenopathy  REVIEW OF SYSTEMS:   Constitutional: Denies fevers, chills or abnormal weight loss Eyes: Denies blurriness of vision Ears, nose, mouth, throat, and face: Denies mucositis or sore throat Respiratory: Denies cough, dyspnea or wheezes Cardiovascular: Denies palpitation, chest discomfort or lower extremity swelling Gastrointestinal:  Denies nausea, heartburn or change in bowel habits Skin: Denies abnormal skin rashes Lymphatics: Denies new lymphadenopathy or easy bruising Neurological:Denies numbness, tingling or new weaknesses Behavioral/Psych: Mood is stable, no new changes  All other systems were reviewed with the patient and are negative.  I have reviewed the past medical history, past  surgical history, social history and family history with the patient and they are unchanged from previous note.  ALLERGIES:  is allergic to amoxicillin; cephalexin; and codeine phosphate.  MEDICATIONS:  Current Outpatient Prescriptions  Medication Sig Dispense Refill  . ACCU-CHEK AVIVA PLUS test strip TEST three times a day 100 each 12  . allopurinol (ZYLOPRIM) 300 MG tablet TAKE 1/2 TABLET BY MOUTH DAILY (Patient taking differently: TAKE 150 MG BY MOUTH DAILY) 45 tablet 3  . amLODipine (NORVASC) 10 MG tablet Take 1 tablet (10 mg total) by mouth daily. 90 tablet 3  . Ascorbic Acid (VITAMIN C) 100 MG tablet Take 100 mg by mouth daily.    Marland Kitchen aspirin 81 MG tablet Take 81 mg by mouth daily.    Marland Kitchen atorvastatin (LIPITOR) 80 MG tablet TAKE 1 TABLET BY MOUTH DAILY (Patient taking differently: TAKE 80 MG BY MOUTH DAILY) 90 tablet 3  . cloNIDine (CATAPRES) 0.3 MG tablet Take 1 tablet (0.3 mg total) by mouth 2 (two) times daily. 180 tablet 3  . clopidogrel (PLAVIX) 75 MG tablet TAKE 1 TABLET BY MOUTH DAILY (Patient taking differently: TAKE 75 MG BY MOUTH DAILY) 90 tablet 3  . fenofibrate (TRICOR) 48 MG tablet Take 1 tablet (48 mg total) by mouth daily. 30 tablet 11  . ferrous sulfate 325 (65 FE) MG tablet Take 1 tablet (325 mg total) by mouth daily with breakfast.  3  . furosemide (LASIX) 40 MG tablet Take 1 tablet (40 mg total) by mouth 2 (two) times daily with breakfast and lunch. 180 tablet 3  . GNP GARLIC EXTRACT PO Take 1 tablet by mouth daily.     Marland Kitchen HUMALOG KWIKPEN 100 UNIT/ML KiwkPen INJECT 5 UNITS INTO THE SKIN THREE TIMES DAILY BEFORE MEALS 15 mL 12  . LANTUS SOLOSTAR 100 UNIT/ML Solostar Pen INJECT 35  UNITS UNDER THE SKIN DAILY 15 mL 12  . lisinopril (PRINIVIL,ZESTRIL) 40 MG tablet TAKE 1 TABLET BY MOUTH EVERY DAY (Patient taking differently: TAKE 40 MG BY MOUTH EVERY DAY) 90 tablet 3  . Melatonin 10 MG CAPS Take 10 mg by mouth at bedtime as needed (sleep).     . metoprolol tartrate (LOPRESSOR)  25 MG tablet TAKE 1/2 TABLET BY MOUTH TWICE DAILY (Patient taking differently: TAKE 12.5 MG BY MOUTH TWICE DAILY) 90 tablet 3  . Multiple Vitamins-Minerals (MULTIVITAMINS THER. W/MINERALS) TABS Take 1 tablet by mouth daily.      . Omega-3 Fatty Acids (FISH OIL) 1000 MG CAPS Take 2,000 mg by mouth 2 (two) times daily.     Marland Kitchen OVER THE COUNTER MEDICATION Take 1 tablet by mouth 2 (two) times daily. Macular Degenerative Eye Supplement    . pantoprazole (PROTONIX) 40 MG tablet TAKE 1 TABLET BY MOUTH DAILY (Patient taking differently: TAKE 40 MG BY MOUTH DAILY) 90 tablet 3  . PARoxetine (PAXIL) 20 MG tablet TAKE 1 TABLET BY MOUTH EVERY MORNING 90 tablet 3  . traMADol (ULTRAM) 50 MG tablet TAKE 1 TABLET BY MOUTH FOUR TIMES DAILY (Patient taking differently: TAKE 50 MG BY MOUTH FOUR TIMES DAILY) 360 tablet 1   No current facility-administered medications for this visit.    PHYSICAL EXAMINATION: ECOG PERFORMANCE STATUS: 0 - Asymptomatic  Filed Vitals:   03/10/15 1312  BP: 133/51  Pulse: 58  Temp: 97.5 F (36.4 C)  Resp: 18   Filed Weights   03/10/15 1312  Weight: 188 lb 8 oz (85.503 kg)    GENERAL:alert, no distress and comfortable SKIN: skin color, texture, turgor are normal, no rashes or significant lesions EYES: normal, Conjunctiva are pink and non-injected, sclera clear OROPHARYNX:no exudate, no erythema and lips, buccal mucosa, and tongue normal  NECK: supple, thyroid normal size, non-tender, without nodularity LYMPH:  no palpable lymphadenopathy in the cervical, axillary or inguinal LUNGS: clear to auscultation and percussion with normal breathing effort HEART: regular rate & rhythm and no murmurs and no lower extremity edema ABDOMEN:abdomen soft, non-tender and normal bowel sounds Musculoskeletal:no cyanosis of digits and no clubbing  NEURO: alert & oriented x 3 with fluent speech, no focal motor/sensory deficits  LABORATORY DATA:  I have reviewed the data as listed    Component  Value Date/Time   NA 142 03/10/2015 1254   NA 144 12/24/2013 0828   K 4.3 03/10/2015 1254   K 4.8 12/24/2013 0828   CL 106 12/24/2013 0828   CL 110* 05/28/2012 1259   CO2 26 03/10/2015 1254   CO2 25 12/24/2013 0828   GLUCOSE 143* 03/10/2015 1254   GLUCOSE 97 12/24/2013 0828   GLUCOSE 202* 05/28/2012 1259   BUN 67.6* 03/10/2015 1254   BUN 47* 12/24/2013 0828   CREATININE 3.0* 03/10/2015 1254   CREATININE 2.26* 12/24/2013 0828   CREATININE 2.48* 09/11/2013 1408   CALCIUM 9.1 03/10/2015 1254   CALCIUM 9.2 12/24/2013 0828   PROT 6.0* 03/10/2015 1254   PROT 6.1 12/29/2014 0844   ALBUMIN 3.1* 03/10/2015 1254   ALBUMIN 3.8 12/29/2014 0844   AST 34 03/10/2015 1254   AST 34 12/29/2014 0844   ALT 24 03/10/2015 1254   ALT 21 12/29/2014 0844   ALKPHOS 79 03/10/2015 1254   ALKPHOS 64 12/29/2014 0844   BILITOT 0.33 03/10/2015 1254   BILITOT 0.4 12/29/2014 0844   GFRNONAA 21* 12/24/2013 0828   GFRNONAA 32* 09/08/2010 0851   GFRAA 24* 12/24/2013 [id number]  GFRAA 38* 09/08/2010 0851    No results found for: SPEP, UPEP  Lab Results  Component Value Date   WBC 7.7 03/10/2015   NEUTROABS 5.0 03/10/2015   HGB 10.2* 03/10/2015   HCT 31.8* 03/10/2015   MCV 96.4 03/10/2015   PLT 290 03/10/2015      Chemistry      Component Value Date/Time   NA 142 03/10/2015 1254   NA 144 12/24/2013 0828   K 4.3 03/10/2015 1254   K 4.8 12/24/2013 0828   CL 106 12/24/2013 0828   CL 110* 05/28/2012 1259   CO2 26 03/10/2015 1254   CO2 25 12/24/2013 0828   BUN 67.6* 03/10/2015 1254   BUN 47* 12/24/2013 0828   CREATININE 3.0* 03/10/2015 1254   CREATININE 2.26* 12/24/2013 0828   CREATININE 2.48* 09/11/2013 1408      Component Value Date/Time   CALCIUM 9.1 03/10/2015 1254   CALCIUM 9.2 12/24/2013 0828   ALKPHOS 79 03/10/2015 1254   ALKPHOS 64 12/29/2014 0844   AST 34 03/10/2015 1254   AST 34 12/29/2014 0844   ALT 24 03/10/2015 1254   ALT 21 12/29/2014 0844   BILITOT 0.33 03/10/2015 1254    BILITOT 0.4 12/29/2014 0844       ASSESSMENT & PLAN:  Chronic lymphocytic leukemia She tolerated treatment very well. Recent imaging studies show no evidence of active cancer. I will see her back in 6 months with history, physical examination and blood work.     Anemia in chronic kidney disease This is likely anemia of chronic disease. The patient denies recent history of bleeding such as epistaxis, hematuria or hematochezia. She is asymptomatic from the anemia. We will observe for now.   Chronic kidney disease, stage IV (severe) (HCC)  Her kidney function is slightly worse. I encourage adequate hydration I recommend she returns to see her nephrologist for further management   No orders of the defined types were placed in this encounter.   All questions were answered. The patient knows to call the clinic with any problems, questions or concerns. No barriers to learning was detected. I spent 15 minutes counseling the patient face to face. The total time spent in the appointment was 20 minutes and more than 50% was on counseling and review of test results     [facility], [place], MD 03/10/2015 2:31 PM

## 2015-03-10 NOTE — Assessment & Plan Note (Signed)
Her kidney function is slightly worse. I encourage adequate hydration I recommend she returns to see her nephrologist for further management

## 2015-03-10 NOTE — Assessment & Plan Note (Signed)
She tolerated treatment very well. Recent imaging studies show no evidence of active cancer. I will see her back in 6 months with history, physical examination and blood work.

## 2015-03-10 NOTE — Telephone Encounter (Signed)
Pt confirmed labs/ov per 02/14 POF, gave pt AVS and Calendar... KJ °

## 2015-03-19 ENCOUNTER — Ambulatory Visit (INDEPENDENT_AMBULATORY_CARE_PROVIDER_SITE_OTHER): Payer: Medicare Other | Admitting: Family Medicine

## 2015-03-19 ENCOUNTER — Encounter: Payer: Self-pay | Admitting: Family Medicine

## 2015-03-19 VITALS — BP 138/56 | HR 50 | Temp 97.9°F | Ht 66.0 in | Wt 186.9 lb

## 2015-03-19 DIAGNOSIS — I5022 Chronic systolic (congestive) heart failure: Secondary | ICD-10-CM | POA: Diagnosis not present

## 2015-03-19 DIAGNOSIS — I739 Peripheral vascular disease, unspecified: Secondary | ICD-10-CM

## 2015-03-19 DIAGNOSIS — N184 Chronic kidney disease, stage 4 (severe): Secondary | ICD-10-CM

## 2015-03-19 DIAGNOSIS — I10 Essential (primary) hypertension: Secondary | ICD-10-CM

## 2015-03-19 DIAGNOSIS — E118 Type 2 diabetes mellitus with unspecified complications: Secondary | ICD-10-CM | POA: Diagnosis not present

## 2015-03-19 LAB — POCT GLYCOSYLATED HEMOGLOBIN (HGB A1C): Hemoglobin A1C: 6.2

## 2015-03-19 NOTE — Patient Instructions (Addendum)
Results for Christine Santos, Christine Santos (MRN [id number]) as of 03/19/2015 10:27  Ref. Range 06/21/2013 12:49 07/05/2013 13:22 08/16/2013 12:50 09/11/2013 14:08 10/17/2013 13:14 12/24/2013 08:28 01/01/2014 11:17 01/14/2014 08:09 01/22/2014 12:30 01/29/2014 09:46 02/12/2014 09:40 03/12/2014 09:27 04/09/2014 08:56 05/07/2014 08:25 06/04/2014 08:53 09/04/2014 12:59 03/10/2015 12:54  Creatinine Latest Ref Range: 0.6-1.1 mg/dL 2.2 (H) 2.3 (H) 2.0 (H) 2.48 (H) 2.6 (H) 2.26 (H) 2.3 (H) 2.4 (H) 2.4 (H) 2.0 (H) 2.5 (H) 2.1 (H) 2.4 (H) 2.9 (H) 2.5 (H) 2.5 (H) 3.0 [facility])   Results for Christine Santos, Christine Santos (MRN [id number]) as of 03/19/2015 10:27  Ref. Range 02/12/2014 09:40 03/12/2014 09:27 04/09/2014 08:56 05/07/2014 08:25 06/04/2014 08:53 09/04/2014 12:59 03/10/2015 12:54  Creatinine Latest Ref Range: 0.6-1.1 mg/dL 2.5 (H) 2.1 (H) 2.4 (H) 2.9 (H) 2.5 (H) 2.5 (H) 3.0 (HH)   You A1C is 6.2 Blood pressure was 138/56 You seem to be in good shape.  No changes in medications.   Take these numbers to your visit with Dr. [name]. I think your kidneys are slowly getting worse and I doubt there is much to do.   Tramadol is OK for pain.  Reminder, no advil, ibuprofen Have the diabetic shoe place fax me a request.   See me in three months.

## 2015-03-20 NOTE — Assessment & Plan Note (Signed)
A1C at goal showing good control

## 2015-03-20 NOTE — Assessment & Plan Note (Signed)
Stable, no change.  Control risk factors.

## 2015-03-20 NOTE — Assessment & Plan Note (Signed)
Stable on current meds 

## 2015-03-20 NOTE — Assessment & Plan Note (Signed)
I believe the rise in creat is due to worsening CKD, despite good control of risk factors.  She is not over diuresed.  Euvolemic to slightly expanded.  No change in meds.  Encouraged to keep close follow up with renal.

## 2015-03-20 NOTE — Assessment & Plan Note (Signed)
Good control

## 2015-03-20 NOTE — Progress Notes (Signed)
   Subjective:    Patient ID: Christine Santos, female    DOB: 05-10-1945, 70 y.o.   MRN: [id number]  HPI FU multiple problems. Main issue is labs from cancer center show a modest rise in creat.  Known CKD secondary to longstainding diabetes.  On lasix.  Meds have been stable.  Is followed by renal and has appointment soon. DM - great control.  No complaints. BP - good control on current meds which she is tolerating well Wt. - not much loss and on the low end of where she typically lives. CHF.  Denies DOE.  Leg swelling is still present but less than usual for her.  No orthopnea. Does inspect feet.  No breakdown.    Review of Systems     Objective:   Physical Exam Lungs clear Cardiac RRR without m or g Abd benign Ext 1+ bilateral edema Pulses diminished in both feet.      Assessment & Plan:

## 2015-03-22 ENCOUNTER — Other Ambulatory Visit: Payer: Self-pay | Admitting: Family Medicine

## 2015-03-22 DIAGNOSIS — E119 Type 2 diabetes mellitus without complications: Secondary | ICD-10-CM | POA: Diagnosis not present

## 2015-03-23 ENCOUNTER — Other Ambulatory Visit: Payer: Self-pay | Admitting: *Deleted

## 2015-03-23 DIAGNOSIS — E119 Type 2 diabetes mellitus without complications: Secondary | ICD-10-CM | POA: Diagnosis not present

## 2015-03-23 MED ORDER — ACCU-CHEK FASTCLIX LANCETS MISC: Status: DC

## 2015-03-26 ENCOUNTER — Telehealth: Payer: Self-pay | Admitting: Family Medicine

## 2015-03-26 DIAGNOSIS — N184 Chronic kidney disease, stage 4 (severe): Secondary | ICD-10-CM | POA: Diagnosis not present

## 2015-03-26 DIAGNOSIS — C9110 Chronic lymphocytic leukemia of B-cell type not having achieved remission: Secondary | ICD-10-CM | POA: Diagnosis not present

## 2015-03-26 NOTE — Telephone Encounter (Signed)
Office note printed and Rx written to be faxed.

## 2015-03-26 NOTE — Telephone Encounter (Signed)
Pt called and would like to have the information faxed to the diabetic shoe store so that she can be measured for diabetic shoes. There fax number is [phone]. Christine Santos

## 2015-03-27 NOTE — Telephone Encounter (Signed)
Form faxed on 03/26/15 in the afternoon.Christine Santos, [name], [place]

## 2015-03-31 ENCOUNTER — Other Ambulatory Visit: Payer: Self-pay | Admitting: Family Medicine

## 2015-04-14 ENCOUNTER — Other Ambulatory Visit: Payer: Self-pay | Admitting: *Deleted

## 2015-04-14 DIAGNOSIS — E1140 Type 2 diabetes mellitus with diabetic neuropathy, unspecified: Secondary | ICD-10-CM

## 2015-04-14 DIAGNOSIS — N184 Chronic kidney disease, stage 4 (severe): Secondary | ICD-10-CM

## 2015-04-14 DIAGNOSIS — N183 Chronic kidney disease, stage 3 unspecified: Secondary | ICD-10-CM

## 2015-04-14 DIAGNOSIS — E1121 Type 2 diabetes mellitus with diabetic nephropathy: Secondary | ICD-10-CM

## 2015-04-14 DIAGNOSIS — I5022 Chronic systolic (congestive) heart failure: Secondary | ICD-10-CM

## 2015-04-14 MED ORDER — FENOFIBRATE 48 MG PO TABS: 48.0000 mg | ORAL_TABLET | Freq: Every day | ORAL | Status: DC

## 2015-04-23 ENCOUNTER — Other Ambulatory Visit: Payer: Self-pay | Admitting: *Deleted

## 2015-04-23 MED ORDER — INSULIN PEN NEEDLE 31G X 5 MM MISC: Status: DC

## 2015-05-05 DIAGNOSIS — E1142 Type 2 diabetes mellitus with diabetic polyneuropathy: Secondary | ICD-10-CM | POA: Diagnosis not present

## 2015-05-26 ENCOUNTER — Other Ambulatory Visit: Payer: Self-pay | Admitting: Family Medicine

## 2015-06-27 ENCOUNTER — Other Ambulatory Visit: Payer: Self-pay | Admitting: Family Medicine

## 2015-07-01 DIAGNOSIS — E119 Type 2 diabetes mellitus without complications: Secondary | ICD-10-CM | POA: Diagnosis not present

## 2015-07-02 ENCOUNTER — Encounter: Payer: Self-pay | Admitting: Family Medicine

## 2015-07-02 ENCOUNTER — Ambulatory Visit (INDEPENDENT_AMBULATORY_CARE_PROVIDER_SITE_OTHER): Payer: Medicare Other | Admitting: Family Medicine

## 2015-07-02 VITALS — BP 175/84 | HR 61 | Temp 97.5°F | Ht 66.0 in | Wt 182.8 lb

## 2015-07-02 DIAGNOSIS — R634 Abnormal weight loss: Secondary | ICD-10-CM

## 2015-07-02 DIAGNOSIS — C9110 Chronic lymphocytic leukemia of B-cell type not having achieved remission: Secondary | ICD-10-CM | POA: Diagnosis not present

## 2015-07-02 DIAGNOSIS — E118 Type 2 diabetes mellitus with unspecified complications: Secondary | ICD-10-CM

## 2015-07-02 DIAGNOSIS — I10 Essential (primary) hypertension: Secondary | ICD-10-CM

## 2015-07-02 DIAGNOSIS — Z1159 Encounter for screening for other viral diseases: Secondary | ICD-10-CM

## 2015-07-02 DIAGNOSIS — N184 Chronic kidney disease, stage 4 (severe): Secondary | ICD-10-CM

## 2015-07-02 LAB — CBC WITH DIFFERENTIAL/PLATELET
BASOS PCT: 0 %
Basophils Absolute: 0 cells/uL (ref 0–200)
EOS ABS: 480 {cells}/uL (ref 15–500)
Eosinophils Relative: 6 %
HEMATOCRIT: 34.2 % — AB (ref 35.0–45.0)
HEMOGLOBIN: 10.9 g/dL — AB (ref 11.7–15.5)
LYMPHS ABS: 1440 {cells}/uL (ref 850–3900)
LYMPHS PCT: 18 %
MCH: 31.1 pg (ref 27.0–33.0)
MCHC: 31.9 g/dL — ABNORMAL LOW (ref 32.0–36.0)
MCV: 97.7 fL (ref 80.0–100.0)
MONO ABS: 800 {cells}/uL (ref 200–950)
MPV: 9.4 fL (ref 7.5–12.5)
Monocytes Relative: 10 %
Neutro Abs: 5280 cells/uL (ref 1500–7800)
Neutrophils Relative %: 66 %
Platelets: 355 10*3/uL (ref 140–400)
RBC: 3.5 MIL/uL — ABNORMAL LOW (ref 3.80–5.10)
RDW: 16.3 % — AB (ref 11.0–15.0)
WBC: 8 10*3/uL (ref 3.8–10.8)

## 2015-07-02 LAB — COMPLETE METABOLIC PANEL WITH GFR
ALT: 21 U/L (ref 6–29)
AST: 35 U/L (ref 10–35)
Albumin: 3.6 g/dL (ref 3.6–5.1)
Alkaline Phosphatase: 77 U/L (ref 33–130)
BILIRUBIN TOTAL: 0.3 mg/dL (ref 0.2–1.2)
BUN: 61 mg/dL — ABNORMAL HIGH (ref 7–25)
CHLORIDE: 104 mmol/L (ref 98–110)
CO2: 26 mmol/L (ref 20–31)
Calcium: 9 mg/dL (ref 8.6–10.4)
Creat: 2.68 mg/dL — ABNORMAL HIGH (ref 0.60–0.93)
GFR, EST NON AFRICAN AMERICAN: 17 mL/min — AB (ref >=60)
GFR, Est African American: 20 mL/min — ABNORMAL LOW (ref >=60)
GLUCOSE: 122 mg/dL — AB (ref 65–99)
Potassium: 4.5 mmol/L (ref 3.5–5.3)
SODIUM: 143 mmol/L (ref 135–146)
TOTAL PROTEIN: 5.7 g/dL — AB (ref 6.1–8.1)

## 2015-07-02 LAB — PHOSPHORUS: Phosphorus: 4 mg/dL (ref 2.1–4.3)

## 2015-07-02 LAB — POCT GLYCOSYLATED HEMOGLOBIN (HGB A1C): HEMOGLOBIN A1C: 6.8

## 2015-07-02 MED ORDER — CLONIDINE HCL 0.3 MG PO TABS: 0.3000 mg | ORAL_TABLET | Freq: Three times a day (TID) | ORAL | Status: DC

## 2015-07-02 NOTE — Assessment & Plan Note (Signed)
Long differential Reflux, uremia, leukemia, diabetic gastroparesis, etc.  Will start with recheck labs.  Likely will need more testing as directed by labs.  Perhaps referral to GI for EGD.

## 2015-07-02 NOTE — Assessment & Plan Note (Signed)
Poor control.  Increase clonidine to tid

## 2015-07-02 NOTE — Assessment & Plan Note (Signed)
Patient has severe enough renal function that wt loss could be due to uremia.

## 2015-07-02 NOTE — Assessment & Plan Note (Signed)
Likely in remission but will check labs to make sure not contributing to weight loss.

## 2015-07-02 NOTE — Assessment & Plan Note (Signed)
Good control.  Certainly has had DM long enough that early satiey could be diabetic gastroparesis.

## 2015-07-02 NOTE — Progress Notes (Signed)
   Subjective:    Patient ID: Christine Santos, female    DOB: Jan 27, 1945, 70 y.o.   MRN: [id number]  HPI  Recheck multiple issues.  Chronic problems:  DM.  Good control A1C at 6.8.  Patient on lantus plus meal coverage.  Denies hypoglycemic spells.  HBP.  Taking meds.  Has systolic hypertension.  No chest pain, SOB or focal neuro sx. New problem Lack of appetite, early satiety and wt loss.  Present x 6 months.  Will be hungry, eats a few bites and then is full.  No vomiting.  No abd pain.  SP cholecystectomy.  Sx are unlike previous GERD sx and she has been on PPI=protonix.  No blood in stools.  Certainly not improving.  Likely getting worse. Last Creat=3.0 for CKD.  Supposed to FU with neuro in July. Denies swelling of ankles.   Review of Systems     Objective:   Physical Exam 6 lb wt loss noted.  Elevated BP noted. Lungs clear Cardiac RRR without m or g Abd benign         Assessment & Plan:

## 2015-07-02 NOTE — Patient Instructions (Signed)
Increase your clonidine to three times per day. I will call tomorrow to tell you about next steps.  I am not sure what those next steps are until I see the blood tests

## 2015-07-02 NOTE — Assessment & Plan Note (Signed)
Screen x 1 - risk is year of birth.

## 2015-07-03 ENCOUNTER — Encounter: Payer: Self-pay | Admitting: Family Medicine

## 2015-07-03 LAB — HEPATITIS C ANTIBODY: HCV Ab: NEGATIVE

## 2015-07-09 ENCOUNTER — Other Ambulatory Visit: Payer: Self-pay | Admitting: Family Medicine

## 2015-07-09 DIAGNOSIS — R634 Abnormal weight loss: Secondary | ICD-10-CM

## 2015-07-16 ENCOUNTER — Other Ambulatory Visit: Payer: Self-pay | Admitting: Gastroenterology

## 2015-07-16 DIAGNOSIS — R1310 Dysphagia, unspecified: Secondary | ICD-10-CM

## 2015-07-16 DIAGNOSIS — J3489 Other specified disorders of nose and nasal sinuses: Secondary | ICD-10-CM | POA: Diagnosis not present

## 2015-07-16 DIAGNOSIS — D500 Iron deficiency anemia secondary to blood loss (chronic): Secondary | ICD-10-CM | POA: Diagnosis not present

## 2015-07-16 DIAGNOSIS — C9110 Chronic lymphocytic leukemia of B-cell type not having achieved remission: Secondary | ICD-10-CM | POA: Diagnosis not present

## 2015-07-16 DIAGNOSIS — I2510 Atherosclerotic heart disease of native coronary artery without angina pectoris: Secondary | ICD-10-CM | POA: Diagnosis not present

## 2015-07-16 DIAGNOSIS — I10 Essential (primary) hypertension: Secondary | ICD-10-CM | POA: Diagnosis not present

## 2015-07-20 ENCOUNTER — Ambulatory Visit
Admission: RE | Admit: 2015-07-20 | Discharge: 2015-07-20 | Disposition: A | Discharge status: Home / Self Care | Payer: Medicare Other | Source: Ambulatory Visit | Attending: Gastroenterology | Admitting: Gastroenterology

## 2015-07-20 DIAGNOSIS — K449 Diaphragmatic hernia without obstruction or gangrene: Secondary | ICD-10-CM | POA: Diagnosis not present

## 2015-07-20 DIAGNOSIS — R1310 Dysphagia, unspecified: Secondary | ICD-10-CM | POA: Diagnosis not present

## 2015-07-22 ENCOUNTER — Telehealth: Payer: Self-pay | Admitting: *Deleted

## 2015-07-22 NOTE — Progress Notes (Signed)
Patient ID: Christine Santos, female   DOB: 1945/06/15, 70 y.o.   MRN: [id number]     Cardiology Office Note   Date:  07/23/2015   ID:  Christine, Santos January 20, 1946, MRN [id number]  PCP:  Christine Gottron, MD    No chief complaint on file. f/u CAD   Wt Readings from Last 3 Encounters:  07/23/15 191 lb 6.4 oz (86.818 kg)  07/02/15 182 lb 12.8 oz (82.918 kg)  03/19/15 186 lb 14.4 oz (84.777 kg)       History of Present Illness: Christine Santos is a 70 y.o. female  who had bilateral LE revascularization. The right leg was treated with PTA and the left SFA was treated with rotational atherectomy a few years ago.  She ha s a h/o CAD, CABG in 1999,  CKD.  She has had restenosis but managed medically due to no claudication.    Was treated for CLL with chemo.  Now in remission.  Feels well.  Tolerated Chemo well.    She has had some swallowing difficulties recently.   No chest pain or SHOB.  No claudication. Occasional cramps. No open sores or nonhealing sores that she has had.   Has a [employer] bike at home, but does not use it.      Past Medical History  Diagnosis Date  . CLL (chronic lymphoblastic leukemia)   . Hyperlipidemia   . Hypertension   . Diastolic heart failure ([place])   . CAD (coronary artery disease)     s/p CABG in 1999  . Peripheral vascular disease ([place])   . Heart murmur   . Myocardial infarction [facility])     "I had one but didn't know it" (01/22/2013)  . PAD (peripheral artery disease) ([place])   . IDDM (insulin dependent diabetes mellitus) ([place])   . Anemia 08/29/2011  . GERD (gastroesophageal reflux disease)   . H/O hiatal hernia   . Arthritis     "legs" (01/22/2013)    Past Surgical History  Procedure Laterality Date  . Cataract extraction w/ intraocular lens  implant, bilateral Bilateral ?2011  . Heel spur excision Bilateral 1970's  . Cholecystectomy  1993  . Tubal ligation  1980's  . Cardiac catheterization      "I've had 2-4" (01/22/2013)  . Coronary  artery bypass graft  10/1994    "CABG X3"  . Shoulder open rotator cuff repair Bilateral 1990's    "2 times on 1 side; once on the other"  . Angioplasty / stenting iliac      [name] 11/25/2010 (01/22/2013)  . Angioplasty / stenting femoral Right 09/2010    SFA/notes 11/25/2010 (01/22/2013)  . Angioplasty / stenting femoral Left 11/2010    SFA/notes 11/25/2010 (01/22/2013)  . Transluminal atherectomy femoral artery Right 01/22/2013    & balloon  . Lower extremity angiogram N/A 01/22/2013    Procedure: LOWER EXTREMITY ANGIOGRAM;  Surgeon: [name], MD;  Location: [facility] CATH LAB;  Service: Cardiovascular;  Laterality: N/A;     Current Outpatient Prescriptions  Medication Sig Dispense Refill  . ACCU-CHEK AVIVA PLUS test strip TEST three times a day 100 each 12  . ACCU-CHEK FASTCLIX LANCETS MISC USE AS DIRECTED 3 TIMES DAILY 100 each 12  . allopurinol (ZYLOPRIM) 300 MG tablet TAKE 1/2 TABLET BY MOUTH DAILY 45 tablet 3  . amLODipine (NORVASC) 10 MG tablet Take 1 tablet (10 mg total) by mouth daily. 90 tablet 3  . Ascorbic Acid (VITAMIN C) 100 MG tablet Take  100 mg by mouth daily.    Marland Kitchen aspirin 81 MG tablet Take 81 mg by mouth daily.    Marland Kitchen atorvastatin (LIPITOR) 80 MG tablet Take 80 mg by mouth daily.    . cloNIDine (CATAPRES) 0.3 MG tablet Take 1 tablet (0.3 mg total) by mouth 3 (three) times daily. 180 tablet 3  . clopidogrel (PLAVIX) 75 MG tablet TAKE 1 TABLET BY MOUTH DAILY 90 tablet 3  . fenofibrate (TRICOR) 48 MG tablet Take 1 tablet (48 mg total) by mouth daily. 30 tablet 4  . ferrous sulfate 325 (65 FE) MG tablet Take 1 tablet (325 mg total) by mouth daily with breakfast.  3  . furosemide (LASIX) 40 MG tablet Take 1 tablet (40 mg total) by mouth 2 (two) times daily with breakfast and lunch. 180 tablet 3  . GNP GARLIC EXTRACT PO Take 1 tablet by mouth daily.     Marland Kitchen HUMALOG KWIKPEN 100 UNIT/ML KiwkPen INJECT 5 UNITS INTO THE SKIN THREE TIMES DAILY BEFORE MEALS 15 mL 12  . Insulin Pen  Needle (B-D UF III MINI PEN NEEDLES) 31G X 5 MM MISC as directed once daily 100 each 12  . LANTUS SOLOSTAR 100 UNIT/ML Solostar Pen INJECT 35 UNITS UNDER THE SKIN DAILY 15 mL 12  . lisinopril (PRINIVIL,ZESTRIL) 40 MG tablet TAKE 1 TABLET BY MOUTH EVERY DAY 90 tablet 3  . Melatonin 10 MG CAPS Take 10 mg by mouth at bedtime as needed (sleep).     . metoprolol tartrate (LOPRESSOR) 25 MG tablet TAKE 1/2 TABLET BY MOUTH TWICE DAILY 90 tablet 3  . Multiple Vitamins-Minerals (MULTIVITAMINS THER. W/MINERALS) TABS Take 1 tablet by mouth daily.      . Omega-3 Fatty Acids (FISH OIL) 1000 MG CAPS Take 2,000 mg by mouth 2 (two) times daily.     Marland Kitchen OVER THE COUNTER MEDICATION Take 1 tablet by mouth 2 (two) times daily. Macular Degenerative Eye Supplement    . pantoprazole (PROTONIX) 40 MG tablet TAKE 1 TABLET BY MOUTH DAILY 90 tablet 3  . PARoxetine (PAXIL) 20 MG tablet TAKE 1 TABLET BY MOUTH EVERY MORNING 90 tablet 3  . traMADol (ULTRAM) 50 MG tablet TAKE 1 TABLET BY MOUTH FOUR TIMES DAILY 360 tablet 1   No current facility-administered medications for this visit.    Allergies:   Amoxicillin; Cephalexin; and Codeine phosphate    Social History:  The patient  reports that she has never smoked. She has never used smokeless tobacco. She reports that she does not drink alcohol or use illicit drugs.   Family History:  The patient's family history includes Heart attack in her mother; Heart disease in her mother; Pneumonia in her father.    ROS:  Please see the history of present illness.   Otherwise, review of systems are positive for leg  Pains, foot pain going up stairs or prolonged walking.   All other systems are reviewed and negative.    PHYSICAL EXAM: VS:  BP 142/72 mmHg  Pulse 57  Ht 5\' 6"  (1.676 m)  Wt 191 lb 6.4 oz (86.818 kg)  BMI 30.91 kg/m2 , BMI Body mass index is 30.91 kg/(m^2). GEN: Well nourished, well developed, in no acute distress HEENT: normal Neck: no JVD, carotid bruits, or  masses Cardiac: RRR; no murmurs, rubs, or gallops,no edema , 2+ bilateral DP Respiratory:  clear to auscultation bilaterally, normal work of breathing GI: soft, nontender, nondistended, + BS MS: no deformity or atrophy Skin: warm and dry, no rash  Neuro:  Strength and sensation are intact Psych: euthymic mood, full affect   EKG:   The ekg ordered today demonstrates NSR, NSST in 3/16   Recent Labs: 07/02/2015: ALT 21; BUN 61*; Creat 2.68*; Hemoglobin 10.9*; Platelets 355; Potassium 4.5; Sodium 143   Lipid Panel    Component Value Date/Time   CHOL 150 12/29/2014 0844   TRIG 152* 12/29/2014 0844   HDL 37* 12/29/2014 0844   CHOLHDL 4.1 12/29/2014 0844   VLDL 30 12/29/2014 0844   LDLCALC 83 12/29/2014 0844     Other studies Reviewed: Additional studies/ records that were reviewed today with results demonstrating: *12/15, LDL 90.   ASSESSMENT AND PLAN:  1. CAD: No angina.  COntinue aggressive secondary prevention.  COntinue statin.  2. CKD: Cr 2.5.  Still on ACE-I.  THis could be stopped if renal function worsens.  BP controlled so I am hesitant to stop it now.   3. HTN: COntinue current meds.  Now on TID clonidine. Will refill.  4. PAD: No claudication.  No tissue loss at this time.  5. Hyperlipidemia: WIll need CMet and lipids checked in December 2017.  LDL 83 in 12/16.    Current medicines are reviewed at length with the patient today.  The patient concerns regarding her medicines were addressed.  The following changes have been made:  No change  Labs/ tests ordered today include:   Orders Placed This Encounter  Procedures  . EKG 12-Lead    Recommend 150 minutes/week of aerobic exercise Low fat, low carb, high fiber diet recommended  Disposition:   FU in 1 year   Signed, Larae Grooms, MD  07/23/2015 2:00 PM    [address], [place], [place]  [address] Phone: [phone]; Fax: [phone]

## 2015-07-22 NOTE — Telephone Encounter (Signed)
Pt states that she went to Dr.Edwards per Dr. Andria Frames request.   Dr. Oletta Lamas did a swallowing test and told her the coughing was not from a blockage (did show some acid reflux) but he thought it was from sinus drainage.  Dr. Oletta Lamas advised that she call Dr. Andria Frames and see if he could send her something in. Kenna Kirn, Salome Spotted, CMA

## 2015-07-23 ENCOUNTER — Encounter: Payer: Self-pay | Admitting: Interventional Cardiology

## 2015-07-23 ENCOUNTER — Other Ambulatory Visit: Payer: Self-pay | Admitting: Family Medicine

## 2015-07-23 ENCOUNTER — Ambulatory Visit (INDEPENDENT_AMBULATORY_CARE_PROVIDER_SITE_OTHER): Payer: Medicare Other | Admitting: Interventional Cardiology

## 2015-07-23 VITALS — BP 142/72 | HR 57 | Ht 66.0 in | Wt 191.4 lb

## 2015-07-23 DIAGNOSIS — E7800 Pure hypercholesterolemia, unspecified: Secondary | ICD-10-CM

## 2015-07-23 DIAGNOSIS — N183 Chronic kidney disease, stage 3 unspecified: Secondary | ICD-10-CM

## 2015-07-23 DIAGNOSIS — I2510 Atherosclerotic heart disease of native coronary artery without angina pectoris: Secondary | ICD-10-CM

## 2015-07-23 DIAGNOSIS — I739 Peripheral vascular disease, unspecified: Secondary | ICD-10-CM

## 2015-07-23 DIAGNOSIS — I10 Essential (primary) hypertension: Secondary | ICD-10-CM

## 2015-07-23 DIAGNOSIS — I5022 Chronic systolic (congestive) heart failure: Secondary | ICD-10-CM

## 2015-07-23 MED ORDER — CLONIDINE HCL 0.3 MG PO TABS: 0.3000 mg | ORAL_TABLET | Freq: Three times a day (TID) | ORAL | Status: DC

## 2015-07-23 MED ORDER — FLUTICASONE PROPIONATE 50 MCG/ACT NA SUSP: 2.0000 | Freq: Every day | NASAL | Status: DC

## 2015-07-23 NOTE — Patient Instructions (Signed)

## 2015-08-05 ENCOUNTER — Ambulatory Visit (INDEPENDENT_AMBULATORY_CARE_PROVIDER_SITE_OTHER): Payer: Medicare Other | Admitting: Family Medicine

## 2015-08-05 ENCOUNTER — Encounter: Payer: Self-pay | Admitting: Family Medicine

## 2015-08-05 VITALS — BP 144/59 | HR 59 | Temp 97.8°F | Ht 66.0 in | Wt 185.0 lb

## 2015-08-05 DIAGNOSIS — J329 Chronic sinusitis, unspecified: Secondary | ICD-10-CM

## 2015-08-05 MED ORDER — AZITHROMYCIN 500 MG PO TABS: 500.0000 mg | ORAL_TABLET | Freq: Every day | ORAL | Status: DC

## 2015-08-05 NOTE — Patient Instructions (Addendum)
You have chronic sinusitis.  It is a problem I sent in an antibiotic prescription to see if it helps. Please continue the flonase. Call me in 2 weeks.  If better, great.  If not better, we will do an ENT referral.

## 2015-08-05 NOTE — Progress Notes (Signed)
   Subjective:    Patient ID: Christine Santos, female    DOB: 22-Mar-1945, 70 y.o.   MRN: [id number]  HPI Patient's swallowing problem is now clearly more upper respiratory.  Her evaluation by GI reveiled GI issues.  Flonase has helped some.  She now complains of thick mucous in back of throat causing coughing and gagging.  Cough seems upper resp to her, not lower.  No fever or facial pain.  Non smoker     Review of Systems     Objective:   Physical Exam Nasal mucosa blue and boggy.  Throat cobblestoned and thick mucous visible in the post pharynx.  No sig neck adenopathy.       Assessment & Plan:

## 2015-08-05 NOTE — Assessment & Plan Note (Signed)
Continue flonase.  Trial of antibiotics.  If no improvement, ENT referral.

## 2015-08-18 ENCOUNTER — Telehealth: Payer: Self-pay | Admitting: *Deleted

## 2015-08-18 DIAGNOSIS — K219 Gastro-esophageal reflux disease without esophagitis: Secondary | ICD-10-CM | POA: Diagnosis not present

## 2015-08-18 DIAGNOSIS — R1310 Dysphagia, unspecified: Secondary | ICD-10-CM | POA: Diagnosis not present

## 2015-08-18 DIAGNOSIS — J3489 Other specified disorders of nose and nasal sinuses: Secondary | ICD-10-CM | POA: Diagnosis not present

## 2015-08-18 DIAGNOSIS — C9110 Chronic lymphocytic leukemia of B-cell type not having achieved remission: Secondary | ICD-10-CM | POA: Diagnosis not present

## 2015-08-18 DIAGNOSIS — E119 Type 2 diabetes mellitus without complications: Secondary | ICD-10-CM | POA: Diagnosis not present

## 2015-08-18 DIAGNOSIS — J329 Chronic sinusitis, unspecified: Secondary | ICD-10-CM

## 2015-08-18 DIAGNOSIS — I739 Peripheral vascular disease, unspecified: Secondary | ICD-10-CM | POA: Diagnosis not present

## 2015-08-18 NOTE — Telephone Encounter (Signed)
Pt calling saying she seen dr Oletta Lamas and he said she need a referral to ENT. Pt wants dr to call her. Please advise. Christine Santos Kennon Holter, CMA

## 2015-08-19 NOTE — Assessment & Plan Note (Signed)
Still symptomatic.  Will refer to ENT as previously planned.

## 2015-08-19 NOTE — Telephone Encounter (Signed)
Spoke to and informed pt about referral. She wanted me to let Dr. Andria Frames know that she is having the light put down her throat on Aug 2nd. Ottis Stain, CMA

## 2015-08-26 DIAGNOSIS — R1310 Dysphagia, unspecified: Secondary | ICD-10-CM | POA: Diagnosis not present

## 2015-08-26 DIAGNOSIS — J3489 Other specified disorders of nose and nasal sinuses: Secondary | ICD-10-CM | POA: Diagnosis not present

## 2015-09-03 ENCOUNTER — Emergency Department (HOSPITAL_COMMUNITY): Payer: Medicare Other

## 2015-09-03 ENCOUNTER — Encounter (HOSPITAL_COMMUNITY): Payer: Self-pay

## 2015-09-03 ENCOUNTER — Telehealth: Payer: Self-pay | Admitting: Family Medicine

## 2015-09-03 ENCOUNTER — Inpatient Hospital Stay (HOSPITAL_COMMUNITY)
Admission: EM | Admit: 2015-09-03 | Discharge: 2015-09-05 | DRG: 065 | Disposition: A | Discharge status: Home Health | Payer: Medicare Other | Attending: Family Medicine | Admitting: Family Medicine

## 2015-09-03 DIAGNOSIS — R42 Dizziness and giddiness: Secondary | ICD-10-CM

## 2015-09-03 DIAGNOSIS — I509 Heart failure, unspecified: Secondary | ICD-10-CM | POA: Diagnosis not present

## 2015-09-03 DIAGNOSIS — N184 Chronic kidney disease, stage 4 (severe): Secondary | ICD-10-CM | POA: Diagnosis not present

## 2015-09-03 DIAGNOSIS — I2510 Atherosclerotic heart disease of native coronary artery without angina pectoris: Secondary | ICD-10-CM | POA: Diagnosis present

## 2015-09-03 DIAGNOSIS — M109 Gout, unspecified: Secondary | ICD-10-CM | POA: Diagnosis present

## 2015-09-03 DIAGNOSIS — E11319 Type 2 diabetes mellitus with unspecified diabetic retinopathy without macular edema: Secondary | ICD-10-CM | POA: Diagnosis present

## 2015-09-03 DIAGNOSIS — I252 Old myocardial infarction: Secondary | ICD-10-CM | POA: Diagnosis not present

## 2015-09-03 DIAGNOSIS — C9111 Chronic lymphocytic leukemia of B-cell type in remission: Secondary | ICD-10-CM | POA: Diagnosis not present

## 2015-09-03 DIAGNOSIS — R001 Bradycardia, unspecified: Secondary | ICD-10-CM | POA: Diagnosis present

## 2015-09-03 DIAGNOSIS — E669 Obesity, unspecified: Secondary | ICD-10-CM | POA: Diagnosis present

## 2015-09-03 DIAGNOSIS — Z794 Long term (current) use of insulin: Secondary | ICD-10-CM

## 2015-09-03 DIAGNOSIS — Z951 Presence of aortocoronary bypass graft: Secondary | ICD-10-CM

## 2015-09-03 DIAGNOSIS — E11649 Type 2 diabetes mellitus with hypoglycemia without coma: Secondary | ICD-10-CM | POA: Diagnosis present

## 2015-09-03 DIAGNOSIS — Z6829 Body mass index (BMI) 29.0-29.9, adult: Secondary | ICD-10-CM

## 2015-09-03 DIAGNOSIS — K210 Gastro-esophageal reflux disease with esophagitis: Secondary | ICD-10-CM | POA: Diagnosis present

## 2015-09-03 DIAGNOSIS — Z8249 Family history of ischemic heart disease and other diseases of the circulatory system: Secondary | ICD-10-CM

## 2015-09-03 DIAGNOSIS — K219 Gastro-esophageal reflux disease without esophagitis: Secondary | ICD-10-CM | POA: Diagnosis not present

## 2015-09-03 DIAGNOSIS — E1151 Type 2 diabetes mellitus with diabetic peripheral angiopathy without gangrene: Secondary | ICD-10-CM | POA: Diagnosis present

## 2015-09-03 DIAGNOSIS — I259 Chronic ischemic heart disease, unspecified: Secondary | ICD-10-CM | POA: Diagnosis not present

## 2015-09-03 DIAGNOSIS — J329 Chronic sinusitis, unspecified: Secondary | ICD-10-CM | POA: Diagnosis present

## 2015-09-03 DIAGNOSIS — H3530 Unspecified macular degeneration: Secondary | ICD-10-CM | POA: Diagnosis present

## 2015-09-03 DIAGNOSIS — F419 Anxiety disorder, unspecified: Secondary | ICD-10-CM | POA: Diagnosis present

## 2015-09-03 DIAGNOSIS — I6381 Other cerebral infarction due to occlusion or stenosis of small artery: Secondary | ICD-10-CM | POA: Diagnosis present

## 2015-09-03 DIAGNOSIS — I5042 Chronic combined systolic (congestive) and diastolic (congestive) heart failure: Secondary | ICD-10-CM | POA: Diagnosis present

## 2015-09-03 DIAGNOSIS — D631 Anemia in chronic kidney disease: Secondary | ICD-10-CM | POA: Diagnosis present

## 2015-09-03 DIAGNOSIS — E1122 Type 2 diabetes mellitus with diabetic chronic kidney disease: Secondary | ICD-10-CM | POA: Diagnosis not present

## 2015-09-03 DIAGNOSIS — Z79899 Other long term (current) drug therapy: Secondary | ICD-10-CM

## 2015-09-03 DIAGNOSIS — Z885 Allergy status to narcotic agent status: Secondary | ICD-10-CM

## 2015-09-03 DIAGNOSIS — I639 Cerebral infarction, unspecified: Secondary | ICD-10-CM | POA: Diagnosis not present

## 2015-09-03 DIAGNOSIS — E785 Hyperlipidemia, unspecified: Secondary | ICD-10-CM | POA: Diagnosis present

## 2015-09-03 DIAGNOSIS — Z881 Allergy status to other antibiotic agents status: Secondary | ICD-10-CM

## 2015-09-03 DIAGNOSIS — R269 Unspecified abnormalities of gait and mobility: Secondary | ICD-10-CM | POA: Diagnosis not present

## 2015-09-03 DIAGNOSIS — H532 Diplopia: Secondary | ICD-10-CM

## 2015-09-03 DIAGNOSIS — I6789 Other cerebrovascular disease: Secondary | ICD-10-CM | POA: Diagnosis not present

## 2015-09-03 DIAGNOSIS — Z7902 Long term (current) use of antithrombotics/antiplatelets: Secondary | ICD-10-CM

## 2015-09-03 DIAGNOSIS — I130 Hypertensive heart and chronic kidney disease with heart failure and stage 1 through stage 4 chronic kidney disease, or unspecified chronic kidney disease: Secondary | ICD-10-CM | POA: Diagnosis present

## 2015-09-03 DIAGNOSIS — K589 Irritable bowel syndrome without diarrhea: Secondary | ICD-10-CM | POA: Diagnosis present

## 2015-09-03 DIAGNOSIS — G467 Other lacunar syndromes: Secondary | ICD-10-CM | POA: Diagnosis not present

## 2015-09-03 DIAGNOSIS — Z7982 Long term (current) use of aspirin: Secondary | ICD-10-CM

## 2015-09-03 LAB — BASIC METABOLIC PANEL
ANION GAP: 11 (ref 5–15)
BUN: 52 mg/dL — ABNORMAL HIGH (ref 6–20)
CO2: 27 mmol/L (ref 22–32)
CREATININE: 2.27 mg/dL — AB (ref 0.44–1.00)
Calcium: 9.3 mg/dL (ref 8.9–10.3)
Chloride: 102 mmol/L (ref 101–111)
GFR calc non Af Amer: 21 mL/min — ABNORMAL LOW (ref >60)
GFR, EST AFRICAN AMERICAN: 24 mL/min — AB (ref >60)
Glucose, Bld: 129 mg/dL — ABNORMAL HIGH (ref 65–99)
Potassium: 4.3 mmol/L (ref 3.5–5.1)
SODIUM: 140 mmol/L (ref 135–145)

## 2015-09-03 LAB — CBC WITH DIFFERENTIAL/PLATELET
Basophils Absolute: 0 10*3/uL (ref 0.0–0.1)
Basophils Relative: 0 %
EOS ABS: 0.4 10*3/uL (ref 0.0–0.7)
EOS PCT: 5 %
HCT: 34 % — ABNORMAL LOW (ref 36.0–46.0)
HEMOGLOBIN: 10.5 g/dL — AB (ref 12.0–15.0)
LYMPHS ABS: 1.3 10*3/uL (ref 0.7–4.0)
Lymphocytes Relative: 15 %
MCH: 30.4 pg (ref 26.0–34.0)
MCHC: 30.9 g/dL (ref 30.0–36.0)
MCV: 98.6 fL (ref 78.0–100.0)
MONO ABS: 0.8 10*3/uL (ref 0.1–1.0)
MONOS PCT: 8 %
NEUTROS PCT: 72 %
Neutro Abs: 6.7 10*3/uL (ref 1.7–7.7)
Platelets: 361 10*3/uL (ref 150–400)
RBC: 3.45 MIL/uL — ABNORMAL LOW (ref 3.87–5.11)
RDW: 15.6 % — ABNORMAL HIGH (ref 11.5–15.5)
WBC: 9.2 10*3/uL (ref 4.0–10.5)

## 2015-09-03 LAB — GLUCOSE, CAPILLARY: GLUCOSE-CAPILLARY: 61 mg/dL — AB (ref 65–99)

## 2015-09-03 LAB — CBG MONITORING, ED: GLUCOSE-CAPILLARY: 91 mg/dL (ref 65–99)

## 2015-09-03 MED ORDER — CLOPIDOGREL BISULFATE 75 MG PO TABS
75.0000 mg | ORAL_TABLET | Freq: Every day | ORAL | Status: DC
  Administered 2015-09-04 – 2015-09-05 (×2): 75 mg via ORAL
  Filled 2015-09-03 (×2): qty 1

## 2015-09-03 MED ORDER — FENOFIBRATE 54 MG PO TABS
54.0000 mg | ORAL_TABLET | Freq: Every day | ORAL | Status: DC
  Administered 2015-09-04 – 2015-09-05 (×2): 54 mg via ORAL
  Filled 2015-09-03 (×3): qty 1

## 2015-09-03 MED ORDER — ASPIRIN 81 MG PO TABS: 81.0000 mg | ORAL_TABLET | Freq: Every day | ORAL | Status: DC

## 2015-09-03 MED ORDER — PANTOPRAZOLE SODIUM 40 MG PO TBEC
40.0000 mg | DELAYED_RELEASE_TABLET | Freq: Every day | ORAL | Status: DC
  Administered 2015-09-04 – 2015-09-05 (×2): 40 mg via ORAL
  Filled 2015-09-03 (×2): qty 1

## 2015-09-03 MED ORDER — ALLOPURINOL 100 MG PO TABS
150.0000 mg | ORAL_TABLET | Freq: Every day | ORAL | Status: DC
  Administered 2015-09-04 – 2015-09-05 (×2): 150 mg via ORAL
  Filled 2015-09-03 (×2): qty 2

## 2015-09-03 MED ORDER — ASPIRIN 300 MG RE SUPP
300.0000 mg | Freq: Once | RECTAL | Status: AC
  Administered 2015-09-04: 300 mg via RECTAL
  Filled 2015-09-03: qty 1

## 2015-09-03 MED ORDER — PAROXETINE HCL 20 MG PO TABS
20.0000 mg | ORAL_TABLET | Freq: Every morning | ORAL | Status: DC
  Administered 2015-09-04 – 2015-09-05 (×2): 20 mg via ORAL
  Filled 2015-09-03 (×2): qty 1

## 2015-09-03 MED ORDER — ENOXAPARIN SODIUM 30 MG/0.3ML ~~LOC~~ SOLN
30.0000 mg | SUBCUTANEOUS | Status: DC
  Administered 2015-09-04 (×2): 30 mg via SUBCUTANEOUS
  Filled 2015-09-03 (×2): qty 0.3

## 2015-09-03 MED ORDER — ATORVASTATIN CALCIUM 80 MG PO TABS
80.0000 mg | ORAL_TABLET | Freq: Every day | ORAL | Status: DC
  Administered 2015-09-04: 80 mg via ORAL
  Filled 2015-09-03: qty 1

## 2015-09-03 MED ORDER — STROKE: EARLY STAGES OF RECOVERY BOOK: Freq: Once | Status: DC

## 2015-09-03 MED ORDER — SENNOSIDES-DOCUSATE SODIUM 8.6-50 MG PO TABS: 1.0000 | ORAL_TABLET | Freq: Every evening | ORAL | Status: DC | PRN

## 2015-09-03 MED ORDER — ASPIRIN 81 MG PO CHEW: 81.0000 mg | CHEWABLE_TABLET | Freq: Every day | ORAL | Status: DC

## 2015-09-03 NOTE — ED Notes (Signed)
Attempted report to 5M.  

## 2015-09-03 NOTE — Telephone Encounter (Signed)
FYI

## 2015-09-03 NOTE — Telephone Encounter (Signed)
Agree that she will need imagine.  Concern is for stroke/TIA.

## 2015-09-03 NOTE — ED Notes (Signed)
Back from MRI.

## 2015-09-03 NOTE — ED Triage Notes (Signed)
Patient here with complaint of dizziness, unsteady gait and double vision that started yesterday at 10am. Denies trauma, denies pain. Alert and oriented.

## 2015-09-03 NOTE — ED Provider Notes (Signed)
[facility] DEPT Provider Note   CSN: [id number] Arrival date & time: 09/03/15  1002  First Provider Contact:  First MD Initiated Contact with Patient 09/03/15 1030     History   Chief Complaint Chief Complaint  Patient presents with  . Blurred Vision  . Dizziness   HPI   Christine Santos is an 70 y.o. female with history of CAD (now on ASA, plavix), CLL, CHF, HTN, HLD, IDDM, GERD who presents to the ED for evaluation of blurred vision/double vision and dizziness. She states she was in her usual state of health until yesterday morning when she started noticing some blurred vision and double vision. She states at times her visual acuity will revert back to baseline. She states it is hard to tell which side is worse but she feels like her left eye might be worse. She also notes that since yesterday morning she has felt dizzy and lightheaded. She states she had vertigo "many years ago" but this feels different. She denies passing out. She states she has now had trouble walking because she can't keep her balance and her gait is unsteady. She denies headache. Denies fall or injury. Otherwise denies new numbness or weakness.   Past Medical History:  Diagnosis Date  . Anemia 08/29/2011  . Arthritis    "legs" (01/22/2013)  . CAD (coronary artery disease)    s/p CABG in 1999  . CLL (chronic lymphoblastic leukemia)   . Diastolic heart failure ([place])   . GERD (gastroesophageal reflux disease)   . H/O hiatal hernia   . Heart murmur   . Hyperlipidemia   . Hypertension   . IDDM (insulin dependent diabetes mellitus) ([place])   . Myocardial infarction [facility])    "I had one but didn't know it" (01/22/2013)  . PAD (peripheral artery disease) ([place])   . Peripheral vascular disease [facility])     Patient Active Problem List   Diagnosis Date Noted  . Sinusitis, chronic 08/05/2015  . Need for hepatitis C screening test 07/02/2015  . Weight loss, unintentional 07/02/2015  . Chronic kidney disease   .  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 Hyperkalemia 06/21/2013  . Diabetic nephropathy ([place]) 05/23/2013  . Diabetic neuropathy ([place]) 11/30/2012  . Macular degeneration 07/04/2012  . Diabetic retinopathy ([place]) 03/20/2012  . Osteopenia 09/22/2011  . Foot pain 09/21/2011  . Anemia in chronic kidney disease 08/29/2011  . Gout 04/13/2011  . Stress incontinence, female 11/11/2010  . Anxiety 08/18/2010  . Chronic systolic heart failure ([place]) 02/18/2009  . SINUS BRADYCARDIA 10/29/2008  . Peripheral vascular disease ([place]) 06/18/2008  . Chronic kidney disease, stage IV (severe) ([place]) 07/13/2007  . Chronic lymphocytic leukemia ([place]) 10/13/2006  . 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 HYPERCHOLESTEROLEMIA 03/23/2006  . OBESITY, NOS 03/23/2006  . Essential hypertension 03/23/2006  . Coronary atherosclerosis 03/23/2006  . REFLUX ESOPHAGITIS 03/23/2006  . IRRITABLE BOWEL SYNDROME 03/23/2006  . CYSTOCELE/RECTOCELE/PROLAPSE,UNSPEC. 03/23/2006  . Osteoarthritis, multiple sites 03/23/2006    Past Surgical History:  Procedure Laterality Date  . ANGIOPLASTY / STENTING FEMORAL Right 09/2010   SFA/notes 11/25/2010 (01/22/2013)  . ANGIOPLASTY / STENTING FEMORAL Left 11/2010   SFA/notes 11/25/2010 (01/22/2013)  . ANGIOPLASTY / STENTING ILIAC     [name] 11/25/2010 (01/22/2013)  . CARDIAC CATHETERIZATION     "I've had 2-4" (01/22/2013)  . CATARACT EXTRACTION W/ INTRAOCULAR LENS  IMPLANT, BILATERAL Bilateral ?2011  . CHOLECYSTECTOMY  1993  . CORONARY ARTERY BYPASS GRAFT  10/1994   "CABG X3"  . HEEL SPUR EXCISION Bilateral 1970's  . LOWER EXTREMITY ANGIOGRAM N/A 01/22/2013   Procedure: LOWER EXTREMITY ANGIOGRAM;  Surgeon: [name], MD;  Location: [facility] CATH LAB;  Service: Cardiovascular;  Laterality: N/A;  . SHOULDER OPEN ROTATOR CUFF REPAIR Bilateral 1990's   "2 times on 1 side; once on the other"  . TRANSLUMINAL  ATHERECTOMY FEMORAL ARTERY Right 01/22/2013   & balloon  . TUBAL LIGATION  1980's    OB History    No data available       Home Medications    Prior to Admission medications   Medication Sig Start Date End Date Taking? Authorizing Provider  ACCU-CHEK AVIVA PLUS test strip TEST three times a day 12/26/14   [name], MD  ACCU-CHEK FASTCLIX LANCETS MISC USE AS DIRECTED 3 TIMES DAILY 03/23/15   [name], MD  allopurinol (ZYLOPRIM) 300 MG tablet TAKE 1/2 TABLET BY MOUTH DAILY 03/31/15   [name], MD  amLODipine (NORVASC) 10 MG tablet Take 1 tablet (10 mg total) by mouth daily. 10/23/14   [name], MD  Ascorbic Acid (VITAMIN C) 100 MG tablet Take 100 mg by mouth daily.    Historical Provider, MD  aspirin 81 MG tablet Take 81 mg by mouth daily.    Historical Provider, MD  atorvastatin (LIPITOR) 80 MG tablet Take 80 mg by mouth daily.    Historical Provider, MD  azithromycin (ZITHROMAX) 500 MG tablet Take 1 tablet (500 mg total) by mouth daily. 08/05/15   [name], MD  cloNIDine (CATAPRES) 0.3 MG tablet Take 1 tablet (0.3 mg total) by mouth 3 (three) times daily. 07/23/15   [name], MD  clopidogrel (PLAVIX) 75 MG tablet TAKE 1 TABLET BY MOUTH DAILY 05/27/15   [name], MD  fenofibrate (TRICOR) 48 MG tablet Take 1 tablet (48 mg total) by mouth daily. 04/14/15   [name], MD  ferrous sulfate 325 (65 FE) MG tablet Take 1 tablet (325 mg total) by mouth daily with breakfast. 05/23/13   [name], MD  fluticasone (FLONASE) 50 MCG/ACT nasal spray Place 2 sprays into both nostrils daily. 07/23/15   [name], MD  furosemide (LASIX) 40 MG tablet Take 1 tablet (40 mg total) by mouth 2 (two) times daily with breakfast and lunch. 10/13/14   [name], MD  GNP GARLIC EXTRACT PO Take 1 tablet by mouth daily.     Historical Provider, MD  HUMALOG KWIKPEN 100 UNIT/ML KiwkPen INJECT 5 UNITS INTO THE SKIN THREE TIMES DAILY BEFORE MEALS  08/15/14   [name], MD  Insulin Pen Needle (B-D UF III MINI PEN NEEDLES) 31G X 5 MM MISC as directed once daily 04/23/15   [name], MD  LANTUS SOLOSTAR 100 UNIT/ML Solostar Pen INJECT 35 UNITS UNDER THE SKIN DAILY 12/01/14   [name], MD  lisinopril (PRINIVIL,ZESTRIL) 40 MG tablet TAKE 1 TABLET BY MOUTH EVERY DAY 03/31/15   [name], MD  Melatonin 10 MG CAPS Take 10 mg by mouth at bedtime as needed (sleep).     Historical Provider, MD  metoprolol tartrate (LOPRESSOR) 25 MG tablet TAKE 1/2 TABLET BY MOUTH TWICE DAILY 03/31/15   [name], MD  Multiple Vitamins-Minerals (MULTIVITAMINS THER. W/MINERALS) TABS Take 1 tablet by mouth daily.      Historical Provider, MD  Omega-3 Fatty Acids (FISH OIL) 1000 MG CAPS Take 2,000 mg  by mouth 2 (two) times daily.     Historical Provider, MD  OVER THE COUNTER MEDICATION Take 1 tablet by mouth 2 (two) times daily. Macular Degenerative Eye Supplement    Historical Provider, MD  pantoprazole (PROTONIX) 40 MG tablet TAKE 1 TABLET BY MOUTH DAILY 03/31/15   [name], MD  PARoxetine (PAXIL) 20 MG tablet TAKE 1 TABLET BY MOUTH EVERY MORNING 12/10/14   [name], MD  traMADol (ULTRAM) 50 MG tablet TAKE 1 TABLET BY MOUTH FOUR TIMES DAILY 03/23/15   [name], MD    Family History Family History  Problem Relation Age of Onset  . Heart disease Mother   . Heart attack Mother   . Pneumonia Father     Social History Social History  Substance Use Topics  . Smoking status: Never Smoker  . Smokeless tobacco: Never Used  . Alcohol use No     Allergies   Amoxicillin; Cephalexin; and Codeine phosphate   Review of Systems Review of Systems  All other systems reviewed and are negative.    Physical Exam Updated Vital Signs BP (!) 210/65 (BP Location: Right Arm)   Pulse (!) 56   Temp 98.1 F (36.7 C) (Oral)   Resp 16   Ht 5\' 6"  (1.676 m)   Wt 81.6 kg   SpO2 94%   BMI 29.05 kg/m   Physical Exam    Constitutional: She is oriented to person, place, and time.  HENT:  Right Ear: External ear normal.  Left Ear: External ear normal.  Nose: Nose normal.  Mouth/Throat: Oropharynx is clear and moist. No oropharyngeal exudate.  Eyes: Conjunctivae are normal.  Left eye slightly drooped, cannot open all the way PERRL Mildly disconjugate gaze. Pt states EOM exacerbates dizziness. No nystagmus.  Neck: Normal range of motion. Neck supple.  Cardiovascular: Normal rate, regular rhythm, normal heart sounds and intact distal pulses.   Pulmonary/Chest: Effort normal and breath sounds normal. No respiratory distress. She has no wheezes.  Abdominal: Soft. Bowel sounds are normal. She exhibits no distension. There is no tenderness. There is no rebound and no guarding.  Musculoskeletal: She exhibits no edema.  Lymphadenopathy:    She has no cervical adenopathy.  Neurological: She is alert and oriented to person, place, and time. No cranial nerve deficit.  FROM bilateral UE and LE. 5/5 strength bilateral UE. 4/5 strength bilateral LE but symmetric. Sensation intact throughout. No pronator drift. Normal finger to nose. No dysarthria.    Skin: Skin is warm and dry.  Psychiatric: She has a normal mood and affect.  Nursing note and vitals reviewed.    ED Treatments / Results  Labs (all labs ordered are listed, but only abnormal results are displayed) Labs Reviewed  BASIC METABOLIC PANEL - Abnormal; Notable for the following:       Result Value   Glucose, Bld 129 (*)    BUN 52 (*)    Creatinine, Ser 2.27 (*)    GFR calc non Af Amer 21 (*)    GFR calc Af Amer 24 (*)    All other components within normal limits  CBC WITH DIFFERENTIAL/PLATELET - Abnormal; Notable for the following:    RBC 3.45 (*)    Hemoglobin 10.5 (*)    HCT 34.0 (*)    RDW 15.6 (*)    All other components within normal limits    EKG  EKG Interpretation  Date/Time:  Thursday September 03 2015 11:04:13 EDT Ventricular Rate:   53  PR Interval:    QRS Duration: 101 QT Interval:  472 QTC Calculation: 444 R Axis:   -15 Text Interpretation:  Sinus rhythm Borderline left axis deviation Repol abnrm suggests ischemia, lateral leads Minimal ST elevation, inferior leads No STEMI.  Confirmed by LONG MD, JOSHUA [phone]) on 09/03/2015 11:30:16 AM       Radiology Ct Head Wo Contrast  Result Date: 09/03/2015 CLINICAL DATA:  Head pressure and dizziness for 2 days, initial encounter EXAM: CT HEAD WITHOUT CONTRAST TECHNIQUE: Contiguous axial images were obtained from the base of the skull through the vertex without intravenous contrast. COMPARISON:  11/26/2008 FINDINGS: The bony calvarium is intact. No gross soft tissue abnormality is seen. A rounded area decreased attenuation is noted at the inferior aspect of the basal ganglia on the right consistent with prior lacunar infarct. This is stable from the prior exam. Hypodensity is noted within the pons on the left also consistent with prior lacunar infarct stable from the prior exam. Chronic white matter ischemic changes are noted in the deep white matter bilaterally also stable from the prior study. No findings to suggest acute hemorrhage, acute infarction or space-occupying mass lesion are noted. IMPRESSION: Changes consistent with prior ischemia.  No acute abnormality noted. Electronically Signed   By: Inez Catalina M.D.   On: 09/03/2015 11:05   Mr Brain Wo Contrast  Result Date: 09/03/2015 CLINICAL DATA:  70 year old female with left eye droop, dizziness, unsteady gait, blurred vision, diplopia since 1000 hours yesterday. Initial encounter. EXAM: MRI HEAD WITHOUT CONTRAST TECHNIQUE: Multiplanar, multiecho pulse sequences of the brain and surrounding structures were obtained without intravenous contrast. COMPARISON:  Head CT without contrast 1059 hours today. Brain MRI 11/26/2008. FINDINGS: Major intracranial vascular flow voids are stable, with a degree of intracranial artery  dolichoectasia again noted. There is a nodular and somewhat linear area of restricted diffusion encompassing 5-6 mm in the left medial midbrain, just to the left and anterior to the cerebral aqueduct (series 4, image 21 and series 5, image 14). Mild T2 hyperintensity without associated hemorrhage or mass effect. There might also be a second punctate focus of restricted diffusion in the right dorsal pons as seen on series 4, image 14, or this might be artifact. Extensive underlying chronic small vessel ischemic disease in the brainstem including multiple chronic lacunar infarcts in the pons. A chronic lacunar infarct in the inferior left thalamus is new since 2010. Incidental inferior basal ganglia perivascular spaces also noted. Several tiny chronic lacunar infarcts in the posterior cerebellar hemispheres. Widespread and occasionally patchy cerebral white matter T2 and FLAIR hyperintensity appears mildly progressed since 2010, mostly subcortical. No cortical encephalomalacia. No chronic cerebral blood products. No midline shift, mass effect, evidence of mass lesion, ventriculomegaly, extra-axial collection or acute intracranial hemorrhage. Cervicomedullary junction and pituitary are within normal limits. Negative visualized cervical spine. Visible internal auditory structures appear normal. Mastoids remain clear. Trace paranasal sinus mucosal thickening is stable. Interval postoperative changes to both globes, otherwise negative orbits soft tissues. Negative scalp soft tissues. Stable visible bone marrow signal. IMPRESSION: 1. 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 This is likely affecting a component of the pathway for control of left eye movement in this clinical setting. No associated hemorrhage or mass effect. 2. Questionable additional punctate lacunar infarct in the dorsal right pons, versus artifact. 3. Underlying severe chronic small vessel  disease in the brainstem. Mild progression of chronic small vessel disease at in the  cerebral white matter and left thalamus. Electronically Signed   By: Genevie Ann M.D.   On: 09/03/2015 15:32    Procedures Procedures (including critical care time)  Medications Ordered in ED Medications - No data to display   Initial Impression / Assessment and Plan / ED Course  I have reviewed the triage vital signs and the nursing notes.  Pertinent labs & imaging results that were available during my care of the patient were reviewed by me and considered in my medical decision making (see chart for details).  Clinical Course    CT reveals prior ischemia, no acute findings. Pt apparently did not know she might have had a stroke in the past. Her symptoms are persistent. Given her signs and symptoms we will obtain an MRI. Labs at baseline.  3:47 PM MRI does show small acute lacunar infarct left midbrain and questionable punctate lacunar infarct dorsal right pons. I spoke with Dr. Tasia Catchings of neurology who will see pt in consultation. I have consulted family practice for a medical admission.   3:54 PM  I spoke with family practice will admit pt.   Final Clinical Impressions(s) / ED Diagnoses   Final diagnoses:  Acute lacunar infarction [facility])  Diplopia  Dizziness    New Prescriptions New Prescriptions   No medications on file     Anne Ng, Hershal Coria 09/03/15 1554    Margette Fast, MD 09/03/15 [phone]

## 2015-09-03 NOTE — ED Notes (Signed)
Pt. In MRI.

## 2015-09-03 NOTE — Consult Note (Signed)
Requesting Physician: Dr. Laverta Baltimore    Chief Complaint:  Diplopia and CVA on MRI  History obtained from:  Patient    HPI:                                                                                                                                         Christine Santos is an 70 y.o. female with history of CAD (now on ASA, plavix), CLL, CHF, HTN, HLD, IDDM, GERD who presents to the ED for evaluation of blurred vision/double vision and dizziness.  She states yesterday at 1000 she noted she felt dizzy but not vertigo. She thought it was her blood sugar and took a glucose pill. This persisted and at 1600 she noted horizontal diplopia and felt off balance due to her vision. Due to not returning to baseline she came to ED today. Currently she has diplopia on upward and horizontal gaze and her husband states her left eye has a new ptosis. BP is 166/93 which is elevated more than normal.  She states she takes her antiplatelets every day. In addition she has been bradycardic while in ED with pulse of 42.   Date last known well: yesterday Time last known well: Time: 10:00 tPA Given: No: out of window  Past Medical History:  Diagnosis Date  . Anemia 08/29/2011  . Arthritis    "legs" (01/22/2013)  . CAD (coronary artery disease)    s/p CABG in 1999  . CLL (chronic lymphoblastic leukemia)   . Diastolic heart failure ([place])   . GERD (gastroesophageal reflux disease)   . H/O hiatal hernia   . Heart murmur   . Hyperlipidemia   . Hypertension   . IDDM (insulin dependent diabetes mellitus) ([place])   . Myocardial infarction [facility])    "I had one but didn't know it" (01/22/2013)  . PAD (peripheral artery disease) ([place])   . Peripheral vascular disease [facility])     Past Surgical History:  Procedure Laterality Date  . ANGIOPLASTY / STENTING FEMORAL Right 09/2010   SFA/notes 11/25/2010 (01/22/2013)  . ANGIOPLASTY / STENTING FEMORAL Left 11/2010   SFA/notes 11/25/2010 (01/22/2013)  . ANGIOPLASTY / STENTING ILIAC     [name] 11/25/2010 (01/22/2013)  . CARDIAC CATHETERIZATION     "I've had 2-4" (01/22/2013)  . CATARACT EXTRACTION W/ INTRAOCULAR LENS  IMPLANT, BILATERAL Bilateral ?2011  . CHOLECYSTECTOMY  1993  . CORONARY ARTERY BYPASS GRAFT  10/1994   "CABG X3"  . HEEL SPUR EXCISION Bilateral 1970's  . LOWER EXTREMITY ANGIOGRAM N/A 01/22/2013   Procedure: LOWER EXTREMITY ANGIOGRAM;  Surgeon: [name], MD;  Location: [facility] CATH LAB;  Service: Cardiovascular;  Laterality: N/A;  . SHOULDER OPEN ROTATOR CUFF REPAIR Bilateral 1990's   "2 times on 1 side; once on the other"  . TRANSLUMINAL ATHERECTOMY FEMORAL ARTERY Right 01/22/2013   & balloon  . TUBAL LIGATION  1980's  Family History  Problem Relation Age of Onset  . Heart disease Mother   . Heart attack Mother   . Pneumonia Father    Social History:  reports that she has never smoked. She has never used smokeless tobacco. She reports that she does not drink alcohol or use drugs.  Allergies:  Allergies  Allergen Reactions  . Amoxicillin Rash  . Cephalexin Rash  . Codeine Phosphate Rash    Medications:                                                                                                                           No current facility-administered medications for this encounter.    Current Outpatient Prescriptions  Medication Sig Dispense Refill  . allopurinol (ZYLOPRIM) 300 MG tablet TAKE 1/2 TABLET BY MOUTH DAILY 45 tablet 3  . amLODipine (NORVASC) 10 MG tablet Take 1 tablet (10 mg total) by mouth daily. 90 tablet 3  . Ascorbic Acid (VITAMIN C) 100 MG tablet Take 100 mg by mouth daily.    Marland Kitchen aspirin 81 MG tablet Take 81 mg by mouth daily.    Marland Kitchen atorvastatin (LIPITOR) 80 MG tablet Take 80 mg by mouth daily.    . cloNIDine (CATAPRES) 0.3 MG tablet Take 1 tablet (0.3 mg total) by mouth 3 (three) times daily. 270 tablet 3  . clopidogrel (PLAVIX) 75 MG tablet TAKE 1 TABLET BY MOUTH DAILY 90 tablet 3  . fenofibrate (TRICOR) 48 MG  tablet Take 1 tablet (48 mg total) by mouth daily. 30 tablet 4  . ferrous sulfate 325 (65 FE) MG tablet Take 1 tablet (325 mg total) by mouth daily with breakfast.  3  . fluticasone (FLONASE) 50 MCG/ACT nasal spray Place 2 sprays into both nostrils daily. 16 g 6  . furosemide (LASIX) 40 MG tablet Take 1 tablet (40 mg total) by mouth 2 (two) times daily with breakfast and lunch. 180 tablet 3  . GNP GARLIC EXTRACT PO Take 1 tablet by mouth daily.     Marland Kitchen HUMALOG KWIKPEN 100 UNIT/ML KiwkPen INJECT 5 UNITS INTO THE SKIN THREE TIMES DAILY BEFORE MEALS 15 mL 12  . LANTUS SOLOSTAR 100 UNIT/ML Solostar Pen INJECT 35 UNITS UNDER THE SKIN DAILY (Patient taking differently: INJECT 40 UNITS UNDER THE SKIN DAILY) 15 mL 12  . lisinopril (PRINIVIL,ZESTRIL) 40 MG tablet TAKE 1 TABLET BY MOUTH EVERY DAY 90 tablet 3  . Melatonin 10 MG CAPS Take 10 mg by mouth at bedtime as needed (sleep).     . metoprolol tartrate (LOPRESSOR) 25 MG tablet TAKE 1/2 TABLET BY MOUTH TWICE DAILY 90 tablet 3  . Multiple Vitamins-Minerals (MULTIVITAMINS THER. W/MINERALS) TABS Take 1 tablet by mouth daily.      . Omega-3 Fatty Acids (FISH OIL) 1000 MG CAPS Take 2,000 mg by mouth 2 (two) times daily.     Marland Kitchen OVER THE COUNTER MEDICATION Take 1 tablet by mouth 2 (two) times daily. Macular Degenerative Eye Supplement    .  pantoprazole (PROTONIX) 40 MG tablet TAKE 1 TABLET BY MOUTH DAILY 90 tablet 3  . PARoxetine (PAXIL) 20 MG tablet TAKE 1 TABLET BY MOUTH EVERY MORNING 90 tablet 3  . traMADol (ULTRAM) 50 MG tablet TAKE 1 TABLET BY MOUTH FOUR TIMES DAILY 360 tablet 1  . azithromycin (ZITHROMAX) 500 MG tablet Take 1 tablet (500 mg total) by mouth daily. (Patient not taking: Reported on 09/03/2015) 5 tablet 0     ROS:                                                                                                                                       History obtained from the patient  General ROS: negative for - chills, fatigue, fever, night  sweats, weight gain or weight loss Psychological ROS: negative for - behavioral disorder, hallucinations, memory difficulties, mood swings or suicidal ideation Ophthalmic ROS: positive for - blurry vision, double vision, ENT ROS: negative for - epistaxis, nasal discharge, oral lesions, sore throat, tinnitus or vertigo Allergy and Immunology ROS: negative for - hives or itchy/watery eyes Hematological and Lymphatic ROS: negative for - bleeding problems, bruising or swollen lymph nodes Endocrine ROS: negative for - galactorrhea, hair pattern changes, polydipsia/polyuria or temperature intolerance Respiratory ROS: negative for - cough, hemoptysis, shortness of breath or wheezing Cardiovascular ROS: negative for - chest pain, dyspnea on exertion, edema or irregular heartbeat Gastrointestinal ROS: negative for - abdominal pain, diarrhea, hematemesis, nausea/vomiting or stool incontinence Genito-Urinary ROS: negative for - dysuria, hematuria, incontinence or urinary frequency/urgency Musculoskeletal ROS: negative for - joint swelling or muscular weakness Neurological ROS: as noted in HPI Dermatological ROS: negative for rash and skin lesion changes  Neurologic Examination:                                                                                                      Blood pressure 171/76, pulse (!) 42, temperature 98.1 F (36.7 C), temperature source Oral, resp. rate 14, height 5\' 6"  (1.676 m), weight 81.6 kg (180 lb), SpO2 96 %.  HEENT-  Normocephalic, no lesions, without obvious abnormality.  Normal external eye and conjunctiva.  Normal TM's bilaterally.  Normal auditory canals and external ears. Normal external nose, mucus membranes and septum.  Normal pharynx. Cardiovascular- S1, S2 normal, pulses palpable throughout   Lungs- chest clear, no wheezing, rales, normal symmetric air entry Abdomen- normal findings: bowel sounds normal Extremities- no edema Lymph-no adenopathy  palpable Musculoskeletal-no joint tenderness, deformity or swelling Skin-warm and dry, no hyperpigmentation,  vitiligo, or suspicious lesions  Neurological Examination Mental Status: Alert, oriented, thought content appropriate.  Speech fluent without evidence of aphasia.  Able to follow 3 step commands without difficulty. Cranial Nerves: II:  Visual fields grossly normal, pupils equal, round, reactive to light and accommodation III,IV, VI: ptosis present left eye, extra-ocular motions intact but shows limited left medial gaze and limited right upward gaze.  Nystagmus noted in left>right when looking to the right.  V,VII: smile symmetric, facial light touch sensation normal bilaterally VIII: hearing normal bilaterally IX,X: uvula rises symmetrically XI: bilateral shoulder shrug XII: midline tongue extension Motor: Right : Upper extremity   5/5    Left:     Upper extremity   5/5  Lower extremity   5/5     Lower extremity   5/5 Tone and bulk:normal tone throughout; no atrophy noted Sensory: Pinprick and light touch intact throughout, bilaterally Deep Tendon Reflexes: 2+ and symmetric throughout Plantars: Right: downgoing   Left: downgoing Cerebellar: normal finger-to-nose, ataxic right heel to shin.  Gait: not tested       Lab Results: Basic Metabolic Panel:  Recent Labs Lab 09/03/15 1041  NA 140  K 4.3  CL 102  CO2 27  GLUCOSE 129*  BUN 52*  CREATININE 2.27*  CALCIUM 9.3    Liver Function Tests: No results for input(s): AST, ALT, ALKPHOS, BILITOT, PROT, ALBUMIN in the last 168 hours. No results for input(s): LIPASE, AMYLASE in the last 168 hours. No results for input(s): AMMONIA in the last 168 hours.  CBC:  Recent Labs Lab 09/03/15 1041  WBC 9.2  NEUTROABS 6.7  HGB 10.5*  HCT 34.0*  MCV 98.6  PLT 361    Cardiac Enzymes: No results for input(s): CKTOTAL, CKMB, CKMBINDEX, TROPONINI in the last 168 hours.  Lipid Panel: No results for input(s): CHOL,  TRIG, HDL, CHOLHDL, VLDL, LDLCALC in the last 168 hours.  CBG: No results for input(s): GLUCAP in the last 168 hours.  Microbiology: Results for orders placed or performed in visit on 01/14/14  TECHNOLOGIST REVIEW     Status: None   Collection Time: 01/14/14  8:09 AM  Result Value Ref Range Status   Technologist Review Variant lymphs present.mod smudge cells  Final    Coagulation Studies: No results for input(s): LABPROT, INR in the last 72 hours.  Imaging: Ct Head Wo Contrast  Result Date: 09/03/2015 CLINICAL DATA:  Head pressure and dizziness for 2 days, initial encounter EXAM: CT HEAD WITHOUT CONTRAST TECHNIQUE: Contiguous axial images were obtained from the base of the skull through the vertex without intravenous contrast. COMPARISON:  11/26/2008 FINDINGS: The bony calvarium is intact. No gross soft tissue abnormality is seen. A rounded area decreased attenuation is noted at the inferior aspect of the basal ganglia on the right consistent with prior lacunar infarct. This is stable from the prior exam. Hypodensity is noted within the pons on the left also consistent with prior lacunar infarct stable from the prior exam. Chronic white matter ischemic changes are noted in the deep white matter bilaterally also stable from the prior study. No findings to suggest acute hemorrhage, acute infarction or space-occupying mass lesion are noted. IMPRESSION: Changes consistent with prior ischemia.  No acute abnormality noted. Electronically Signed   By: Inez Catalina M.D.   On: 09/03/2015 11:05   Mr Brain Wo Contrast  Result Date: 09/03/2015 CLINICAL DATA:  70 year old female with left eye droop, dizziness, unsteady gait, blurred vision, diplopia since 1000 hours yesterday. Initial encounter.  EXAM: MRI HEAD WITHOUT CONTRAST TECHNIQUE: Multiplanar, multiecho pulse sequences of the brain and surrounding structures were obtained without intravenous contrast. COMPARISON:  Head CT without contrast 1059 hours  today. Brain MRI 11/26/2008. FINDINGS: Major intracranial vascular flow voids are stable, with a degree of intracranial artery dolichoectasia again noted. There is a nodular and somewhat linear area of restricted diffusion encompassing 5-6 mm in the left medial midbrain, just to the left and anterior to the cerebral aqueduct (series 4, image 21 and series 5, image 14). Mild T2 hyperintensity without associated hemorrhage or mass effect. There might also be a second punctate focus of restricted diffusion in the right dorsal pons as seen on series 4, image 14, or this might be artifact. Extensive underlying chronic small vessel ischemic disease in the brainstem including multiple chronic lacunar infarcts in the pons. A chronic lacunar infarct in the inferior left thalamus is new since 2010. Incidental inferior basal ganglia perivascular spaces also noted. Several tiny chronic lacunar infarcts in the posterior cerebellar hemispheres. Widespread and occasionally patchy cerebral white matter T2 and FLAIR hyperintensity appears mildly progressed since 2010, mostly subcortical. No cortical encephalomalacia. No chronic cerebral blood products. No midline shift, mass effect, evidence of mass lesion, ventriculomegaly, extra-axial collection or acute intracranial hemorrhage. Cervicomedullary junction and pituitary are within normal limits. Negative visualized cervical spine. Visible internal auditory structures appear normal. Mastoids remain clear. Trace paranasal sinus mucosal thickening is stable. Interval postoperative changes to both globes, otherwise negative orbits soft tissues. Negative scalp soft tissues. Stable visible bone marrow signal. IMPRESSION: 1. 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 This is likely affecting a component of the pathway for control of left eye movement in this clinical setting. No associated hemorrhage or mass effect. 2. Questionable  additional punctate lacunar infarct in the dorsal right pons, versus artifact. 3. Underlying severe chronic small vessel disease in the brainstem. Mild progression of chronic small vessel disease at in the cerebral white matter and left thalamus. Electronically Signed   By: Genevie Ann M.D.   On: 09/03/2015 15:32       DWI                                            DWI                            DWI                                      ADC  Assessment and plan discussed with with attending physician and they are in agreement.    Etta Quill PA-C [facility] [phone]  09/03/2015, 4:18 PM   Assessment: 70 y.o. female with acute left midbrain near midline anterior to cerebral aqueduct. Exam is positive for left ptosis, mild left INO and restricted upward gaze on the right eye.   Stroke Risk Factors - hyperlipidemia and hypertension  Recommend: 1. HgbA1c, fasting lipid panel 2. MRA  of the brain without contrast 3. PT consult, OT consult, Speech consult 4. Echocardiogram 5. Prophylactic therapy-Antiplatelet med: Aspirin - dose 81 mg daily and Antiplatelet med: Plavix - dose 75 mg daily 6. Risk factor modification 7. Telemetry monitoring 8. Frequent  neuro checks 9 NPO until passes stroke swallow screen 10 please page stroke NP  Or  PA  Or MD from 8am -4 pm  as this patient from this time will be  followed by the stroke.   You can look them up on [email or web address]    Neurology attending:  Ahniyah is a pleasant 70 year old patient who presents with a history of blurred and double vision that was noted yesterday. She also reported some dizziness. Her past medical history and laboratory studies have been reviewed. Her MRI reveals a small punctate midbrain infarction. Agree with above assessment and plan.  Elson Clan M.D. Neuro hospitalist

## 2015-09-03 NOTE — Telephone Encounter (Signed)
Pt is seeing double and is unable to walk without help. Pt was advised by Lauren to go to the ED. ep

## 2015-09-03 NOTE — ED Notes (Signed)
Report given.

## 2015-09-03 NOTE — H&P (Signed)
[facility] Admission History and Physical Service Pager: [phone]  Patient name: Christine Santos Medical record number: [id number] Date of birth: 1945/11/27 Age: 70 y.o. Gender: female  Primary Care Provider: Zigmund Gottron, MD Consultants: Neurology Code Status: FULL  Chief Complaint: vision changes and unsteady gait  Assessment and Plan: Christine Santos is a 70 y.o. female presenting with vision changes and unsteady gait. PMH is significant for CAD (now on ASA, plavix), CLL(in remission), CHF, HTN, HLD, IDDM, GERD.  Acute CVA, new. Found on MRI showing small acute lacunar infarct in the left midbrain and questionable additional punctate lacunar infarct in the dorsal right pons. Patient symptomatic with vision changes, neuro deficits, and unsteady gait. Risk factors for stroke include CAD, HLD, and HTN.  - Admit to inpatient with telemtry, under Dr McDiarmid - per Neurology, MRA of brain wo - continue ASA81, Plavix 75mg , atorvastatin 80mg   - permissive HTN - risk stratification labs: lipid panel, a1c. - echo and carotid dopplers - neuro checks - NPO until passes swallow screen - SLP to see, appreciate recommendations - PT/ OT eval, appreciate recommendations  H/o diastolic CHF w/ h/o CAD s/p CABG 1996. Stable; not in acute exacerbation. at home on lasix, metoprolol, lisinopril. Last echo seen was in 2010 that did not show HF only mild pulmonary HTN. Follows with cardiology for her CAD; last seen 06/2015 - echo - hold home meds in setting of acute CVA for permissive HTN - continue home statin - holding metoprolol since HR on admit in low 50s, restart as needed - consider restart lasix at lower dose or frequency tomorrow since patient already had lasix po 40mg  this am  T2DM last a1c 6.8 on 07/02/15, at home on lantus 40U daily with 5U tid with meals. -holding home medications while NPO until passes swallow eval. Add back as needed.  - a1c pending -  monitor blood glucose  HTN at home on norvasc, clonidine, lisinopril. BPs elevated on admission - hold home meds in setting of acute CVA for permissive HTN - restart home med clonidine if rebound hypertension  CKD stage 4 with anemia.  At home on ferrous sulfate. On admit Hgb 10.5 with baseline 10.2-11.5 and Cr 2.27 with baseline 2.1-2.5. - monitor CBC and BMP - avoid nephrotoxic medications  HLD at home on fenofibrate and atorvastatin. last lipid panel 12/29/14 - continue home meds -recheck lipid panel  GERD at home on protonix - continue home med  Gout at home on allopurinol.  - continue home med  Anxiety at home on paxil - continue home med  FEN/GI: SLIV, NPO until passes swallow screen, protonix, senna-docusate Prophylaxis: lovenox  Disposition: Admit to inpatient pending further stroke work-up.   History of Present Illness:  Christine Santos is a 70 y.o. female presenting with vision changes and unsteady gait.  Patient states was in usual state of health yesterday am. Was at rest on couch around 10am when started feeling unwell, thought it was her blood sugar so took a glucose tablet. She lives with a friend who is blind, friend states that patient sounded different to him like she sounded "weaker." Then states her vision started getting blurry before starting to develop double vision later that day. States started having difficulty walking but not in one side in particular, denies one-sided weakness. Was able to ambulate holding onto things but felt like was going to fall. Had some dizziness. Patient thought these symptoms would be temporary so she went to sleep  but they persisted after she woke up this am so she presented to the ED. States has had L eyelid droop since 2012 when she had cataract surgery that never improved but looking at herself today in the ED states that L eyelid droop was more pronounced than usual. She endorses some HA that she attributes to chronic sinus issues  that she will be following up with ENT as an outpatient.  Review Of Systems: Per HPI with the following additions: denies chest pain, dyspnea, vomiting, normal bowel Otherwise the remainder of the systems were negative.  Patient Active Problem List   Diagnosis Date Noted  . Lacunar infarct, acute ([place]) 09/03/2015  . Sinusitis, chronic 08/05/2015  . Need for hepatitis C screening test 07/02/2015  . Weight loss, unintentional 07/02/2015  . Chronic kidney disease   . Diabetes mellitus type 2 with neurological manifestations ([place]) 02/26/2014  . Hyperkalemia 06/21/2013  . Diabetic nephropathy ([place]) 05/23/2013  . Diabetic neuropathy ([place]) 11/30/2012  . Macular degeneration 07/04/2012  . Diabetic retinopathy ([place]) 03/20/2012  . Osteopenia 09/22/2011  . Foot pain 09/21/2011  . Anemia in chronic kidney disease 08/29/2011  . Gout 04/13/2011  . Stress incontinence, female 11/11/2010  . Anxiety 08/18/2010  . Chronic systolic heart failure ([place]) 02/18/2009  . SINUS BRADYCARDIA 10/29/2008  . Peripheral vascular disease ([place]) 06/18/2008  . Chronic kidney disease, stage IV (severe) ([place]) 07/13/2007  . Chronic lymphocytic leukemia ([place]) 10/13/2006  . 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 HYPERCHOLESTEROLEMIA 03/23/2006  . OBESITY, NOS 03/23/2006  . Essential hypertension 03/23/2006  . Coronary atherosclerosis 03/23/2006  . REFLUX ESOPHAGITIS 03/23/2006  . IRRITABLE BOWEL SYNDROME 03/23/2006  . CYSTOCELE/RECTOCELE/PROLAPSE,UNSPEC. 03/23/2006  . Osteoarthritis, multiple sites 03/23/2006    Past Medical History: Past Medical History:  Diagnosis Date  . Anemia 08/29/2011  . Arthritis    "legs" (01/22/2013)  . CAD (coronary artery disease)    s/p CABG in 1999  . CLL (chronic lymphoblastic leukemia)   . Diastolic heart failure ([employer])   . GERD (gastroesophageal reflux disease)   . H/O hiatal hernia   . Heart murmur   . Hyperlipidemia   . Hypertension   .  IDDM (insulin dependent diabetes mellitus) ([place])   . Myocardial infarction [facility])    "I had one but didn't know it" (01/22/2013)  . PAD (peripheral artery disease) ([place])   . Peripheral vascular disease [facility])     Past Surgical History: Past Surgical History:  Procedure Laterality Date  . ANGIOPLASTY / STENTING FEMORAL Right 09/2010   SFA/notes 11/25/2010 (01/22/2013)  . ANGIOPLASTY / STENTING FEMORAL Left 11/2010   SFA/notes 11/25/2010 (01/22/2013)  . ANGIOPLASTY / STENTING ILIAC     [name] 11/25/2010 (01/22/2013)  . CARDIAC CATHETERIZATION     "I've had 2-4" (01/22/2013)  . CATARACT EXTRACTION W/ INTRAOCULAR LENS  IMPLANT, BILATERAL Bilateral ?2011  . CHOLECYSTECTOMY  1993  . CORONARY ARTERY BYPASS GRAFT  10/1994   "CABG X3"  . HEEL SPUR EXCISION Bilateral 1970's  . LOWER EXTREMITY ANGIOGRAM N/A 01/22/2013   Procedure: LOWER EXTREMITY ANGIOGRAM;  Surgeon: [name], MD;  Location: [facility] CATH LAB;  Service: Cardiovascular;  Laterality: N/A;  . SHOULDER OPEN ROTATOR CUFF REPAIR Bilateral 1990's   "2 times on 1 side; once on the other"  . TRANSLUMINAL ATHERECTOMY FEMORAL ARTERY Right 01/22/2013   & balloon  . TUBAL LIGATION  1980's    Social History: Social History  Substance Use Topics  . Smoking status: Never Smoker  .  Smokeless tobacco: Never Used  . Alcohol use No   Additional social history: Lives with female friend  Please also refer to relevant sections of EMR.  Family History: Family History  Problem Relation Age of Onset  . Heart disease Mother   . Heart attack Mother   . Pneumonia Father     Allergies and Medications: Allergies  Allergen Reactions  . Amoxicillin Rash  . Cephalexin Rash  . Codeine Phosphate Rash   No current facility-administered medications on file prior to encounter.    Current Outpatient Prescriptions on File Prior to Encounter  Medication Sig Dispense Refill  . allopurinol (ZYLOPRIM) 300 MG tablet TAKE 1/2 TABLET BY MOUTH DAILY  45 tablet 3  . amLODipine (NORVASC) 10 MG tablet Take 1 tablet (10 mg total) by mouth daily. 90 tablet 3  . Ascorbic Acid (VITAMIN C) 100 MG tablet Take 100 mg by mouth daily.    Marland Kitchen aspirin 81 MG tablet Take 81 mg by mouth daily.    Marland Kitchen atorvastatin (LIPITOR) 80 MG tablet Take 80 mg by mouth daily.    . cloNIDine (CATAPRES) 0.3 MG tablet Take 1 tablet (0.3 mg total) by mouth 3 (three) times daily. 270 tablet 3  . clopidogrel (PLAVIX) 75 MG tablet TAKE 1 TABLET BY MOUTH DAILY 90 tablet 3  . fenofibrate (TRICOR) 48 MG tablet Take 1 tablet (48 mg total) by mouth daily. 30 tablet 4  . ferrous sulfate 325 (65 FE) MG tablet Take 1 tablet (325 mg total) by mouth daily with breakfast.  3  . fluticasone (FLONASE) 50 MCG/ACT nasal spray Place 2 sprays into both nostrils daily. 16 g 6  . furosemide (LASIX) 40 MG tablet Take 1 tablet (40 mg total) by mouth 2 (two) times daily with breakfast and lunch. 180 tablet 3  . GNP GARLIC EXTRACT PO Take 1 tablet by mouth daily.     Marland Kitchen HUMALOG KWIKPEN 100 UNIT/ML KiwkPen INJECT 5 UNITS INTO THE SKIN THREE TIMES DAILY BEFORE MEALS 15 mL 12  . LANTUS SOLOSTAR 100 UNIT/ML Solostar Pen INJECT 35 UNITS UNDER THE SKIN DAILY (Patient taking differently: INJECT 40 UNITS UNDER THE SKIN DAILY) 15 mL 12  . lisinopril (PRINIVIL,ZESTRIL) 40 MG tablet TAKE 1 TABLET BY MOUTH EVERY DAY 90 tablet 3  . Melatonin 10 MG CAPS Take 10 mg by mouth at bedtime as needed (sleep).     . metoprolol tartrate (LOPRESSOR) 25 MG tablet TAKE 1/2 TABLET BY MOUTH TWICE DAILY 90 tablet 3  . Multiple Vitamins-Minerals (MULTIVITAMINS THER. W/MINERALS) TABS Take 1 tablet by mouth daily.      . Omega-3 Fatty Acids (FISH OIL) 1000 MG CAPS Take 2,000 mg by mouth 2 (two) times daily.     Marland Kitchen OVER THE COUNTER MEDICATION Take 1 tablet by mouth 2 (two) times daily. Macular Degenerative Eye Supplement    . pantoprazole (PROTONIX) 40 MG tablet TAKE 1 TABLET BY MOUTH DAILY 90 tablet 3  . PARoxetine (PAXIL) 20 MG tablet  TAKE 1 TABLET BY MOUTH EVERY MORNING 90 tablet 3  . traMADol (ULTRAM) 50 MG tablet TAKE 1 TABLET BY MOUTH FOUR TIMES DAILY 360 tablet 1  . azithromycin (ZITHROMAX) 500 MG tablet Take 1 tablet (500 mg total) by mouth daily. (Patient not taking: Reported on 09/03/2015) 5 tablet 0    Objective: BP 158/69   Pulse (!) 53   Temp 98.1 F (36.7 C) (Oral)   Resp 21   Ht 5\' 6"  (1.676 m)   Wt 180  lb (81.6 kg)   SpO2 97%   BMI 29.05 kg/m  Exam: General: Laying in bed, in NAD Eyes: L ptosis with L delayed pupillary reaction to light. R pupil is reactive. Visual fields intact.  ENTM: MMM Cardiovascular: RRR, no murmurs noted Respiratory: CTAB, normal effort Abdomen: soft, nt, nd, +bs MSK: moves all extremities. Normal tone/strength.  Skin: no rashes noted  Neuro: A&Ox3, sensation intact throughout, DTRs 2+ CNII-XII intact except for L ptosis and delayed L pupillary reaction. Limited left medial gaze and limited right upward gaze. Nystagmus noted in left>right when looking to the right. Strength 5/5 Psych: appropriate affect and mood  Labs and Imaging: Results for orders placed or performed during the hospital encounter of 09/03/15 (from the past 24 hour(s))  Basic metabolic panel     Status: Abnormal   Collection Time: 09/03/15 10:41 AM  Result Value Ref Range   Sodium 140 135 - 145 mmol/L   Potassium 4.3 3.5 - 5.1 mmol/L   Chloride 102 101 - 111 mmol/L   CO2 27 22 - 32 mmol/L   Glucose, Bld 129 (H) 65 - 99 mg/dL   BUN 52 (H) 6 - 20 mg/dL   Creatinine, Ser 2.27 (H) 0.44 - 1.00 mg/dL   Calcium 9.3 8.9 - 10.3 mg/dL   GFR calc non Af Amer 21 (L) >60 mL/min   GFR calc Af Amer 24 (L) >60 mL/min   Anion gap 11 5 - 15  CBC with Differential     Status: Abnormal   Collection Time: 09/03/15 10:41 AM  Result Value Ref Range   WBC 9.2 4.0 - 10.5 K/uL   RBC 3.45 (L) 3.87 - 5.11 MIL/uL   Hemoglobin 10.5 (L) 12.0 - 15.0 g/dL   HCT 34.0 (L) 36.0 - 46.0 %   MCV 98.6 78.0 - 100.0 fL   MCH 30.4  26.0 - 34.0 pg   MCHC 30.9 30.0 - 36.0 g/dL   RDW 15.6 (H) 11.5 - 15.5 %   Platelets 361 150 - 400 K/uL   Neutrophils Relative % 72 %   Neutro Abs 6.7 1.7 - 7.7 K/uL   Lymphocytes Relative 15 %   Lymphs Abs 1.3 0.7 - 4.0 K/uL   Monocytes Relative 8 %   Monocytes Absolute 0.8 0.1 - 1.0 K/uL   Eosinophils Relative 5 %   Eosinophils Absolute 0.4 0.0 - 0.7 K/uL   Basophils Relative 0 %   Basophils Absolute 0.0 0.0 - 0.1 K/uL  CBG monitoring, ED     Status: None   Collection Time: 09/03/15  4:17 PM  Result Value Ref Range   Glucose-Capillary 91 65 - 99 mg/dL   Ct Head Wo Contrast  Result Date: 09/03/2015 IMPRESSION: Changes consistent with prior ischemia.  No acute abnormality noted. Electronically Signed   By: Inez Catalina M.D.   On: 09/03/2015 11:05   Mr Brain Wo Contrast  Result Date: 09/03/2015 IMPRESSION: 1. 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 This is likely affecting a component of the pathway for control of left eye movement in this clinical setting. No associated hemorrhage or mass effect. 2. Questionable additional punctate lacunar infarct in the dorsal right pons, versus artifact. 3. Underlying severe chronic small vessel disease in the brainstem. Mild progression of chronic small vessel disease at in the cerebral white matter and left thalamus. Electronically Signed   By: Genevie Ann M.D.   On: 09/03/2015 15:32  Bufford Lope, DO 09/03/2015, 6:46 PM PGY-1, [place] Intern pager: [phone], text pages welcome   FPTS Upper-Level Resident Addendum  I have independently interviewed and examined the patient. I have discussed the above with the original author and agree with their documentation. My edits for correction/addition/clarification are in pink. Please see also any attending notes.   Katheren Shams, DO PGY-3, [place] Service pager: [phone] (text pages welcome  through [facility])

## 2015-09-04 ENCOUNTER — Inpatient Hospital Stay (HOSPITAL_COMMUNITY): Payer: Medicare Other

## 2015-09-04 ENCOUNTER — Encounter: Payer: Self-pay | Admitting: *Deleted

## 2015-09-04 DIAGNOSIS — I639 Cerebral infarction, unspecified: Secondary | ICD-10-CM

## 2015-09-04 DIAGNOSIS — I6789 Other cerebrovascular disease: Secondary | ICD-10-CM

## 2015-09-04 DIAGNOSIS — H532 Diplopia: Secondary | ICD-10-CM

## 2015-09-04 DIAGNOSIS — Z006 Encounter for examination for normal comparison and control in clinical research program: Secondary | ICD-10-CM

## 2015-09-04 LAB — GLUCOSE, CAPILLARY
GLUCOSE-CAPILLARY: 224 mg/dL — AB (ref 65–99)
GLUCOSE-CAPILLARY: 58 mg/dL — AB (ref 65–99)
GLUCOSE-CAPILLARY: 71 mg/dL (ref 65–99)
GLUCOSE-CAPILLARY: 83 mg/dL (ref 65–99)
Glucose-Capillary: 99 mg/dL (ref 65–99)

## 2015-09-04 LAB — VAS US CAROTID
LCCAPDIAS: 10 cm/s
LEFT ECA DIAS: 31 cm/s
LEFT VERTEBRAL DIAS: 10 cm/s
LICAPDIAS: 31 cm/s
LICAPSYS: 148 cm/s
Left CCA dist dias: -10 cm/s
Left CCA dist sys: -49 cm/s
Left CCA prox sys: 63 cm/s
Left ICA dist dias: -15 cm/s
Left ICA dist sys: -72 cm/s
RCCAPDIAS: 7 cm/s
RIGHT ECA DIAS: -12 cm/s
RIGHT VERTEBRAL DIAS: 15 cm/s
Right CCA prox sys: 42 cm/s
Right cca dist sys: -59 cm/s

## 2015-09-04 LAB — ECHOCARDIOGRAM COMPLETE
HEIGHTINCHES: 66 in
WEIGHTICAEL: 2947.2 [oz_av]

## 2015-09-04 LAB — LIPID PANEL
CHOLESTEROL: 169 mg/dL (ref 0–200)
HDL: 32 mg/dL — AB (ref >40)
LDL CALC: 99 mg/dL (ref 0–99)
TRIGLYCERIDES: 188 mg/dL — AB (ref <150)
Total CHOL/HDL Ratio: 5.3 RATIO
VLDL: 38 mg/dL (ref 0–40)

## 2015-09-04 LAB — TSH: TSH: 3.397 u[IU]/mL (ref 0.350–4.500)

## 2015-09-04 MED ORDER — INSULIN ASPART 100 UNIT/ML ~~LOC~~ SOLN
0.0000 [IU] | Freq: Three times a day (TID) | SUBCUTANEOUS | Status: DC
  Administered 2015-09-04: 5 [IU] via SUBCUTANEOUS
  Administered 2015-09-05 (×2): 3 [IU] via SUBCUTANEOUS

## 2015-09-04 MED ORDER — INSULIN ASPART 100 UNIT/ML ~~LOC~~ SOLN
0.0000 [IU] | Freq: Every day | SUBCUTANEOUS | Status: DC
  Administered 2015-09-04: 2 [IU] via SUBCUTANEOUS

## 2015-09-04 MED ORDER — MELATONIN 3 MG PO TABS
10.5000 mg | ORAL_TABLET | Freq: Every evening | ORAL | Status: DC | PRN
  Administered 2015-09-04: 10.5 mg via ORAL
  Filled 2015-09-04 (×2): qty 3.5

## 2015-09-04 NOTE — Progress Notes (Signed)
SLP Cancellation Note  Patient Details Name: Christine Santos MRN: [id number] DOB: 05-07-1945   Cancelled treatment:       Reason Eval/Treat Not Completed: SLP screened, no needs identified, will sign off. Cognitive-linguistic eval ordered. These skills were monitored during bedside swallow eval. Pt is oriented x4, conversant, able to follow multi-step directions, no dysarthria or aphasia noted. Pt and family at bedside report no difficulty with cognition or speech/ language skills. Will sign off at this time; please re-consult if needs arise.   Kern Reap, [place], CCC-SLP 09/04/2015, 11:17 AM [phone]

## 2015-09-04 NOTE — Progress Notes (Signed)
Family Medicine Teaching Service Daily Progress Note Intern Pager: [phone]  Patient name: Christine Santos Medical record number: [id number] Date of birth: [birth year] Age: 70 y.o. Gender: female  Primary Care Provider: Zigmund Gottron, MD Consultants: neurology Code Status: FULL  Pt Overview and Major Events to Date:  Admitted on 09/03/15  Assessment and Plan: Christine Santos is a 70 y.o. female presenting with vision changes and unsteady gait. PMH is significant for CAD (now on ASA, plavix), CLL(in remission), CHF, HTN, HLD, IDDM, GERD.  Acute CVA, new. S/p 48 hours from onset of symptoms. Found on MRI showing small acute lacunar infarct in the left midbrain and questionable additional punctate lacunar infarct in the dorsal right pons. Patient symptomatic with vision changes, neuro deficits, and unsteady gait. Risk factors for stroke include CAD, HLD, and HTN. Failed swallow eval - continue ASA81, Plavix 75mg , atorvastatin 80mg   - permissive HTN - risk stratification labs: a1c. - echo and carotid dopplers - neuro checks - neurology consulted, appreciate reccs - SLP c/s, appreciate recommendations - PT/ OT eval, appreciate recommendations  H/o diastolic CHF w/ h/o CAD s/p CABG 1996. Stable; not in acute exacerbation. at home on lasix, metoprolol, lisinopril. Last echo seen was in 2010 that did not show HF only mild pulmonary HTN. Follows with cardiology for her CAD; last seen 06/2015 - echo - hold home meds in setting of acute CVA for permissive HTN - continue home statin - consider restart lasix at lower dose if volume overloaded  T2DM last a1c 6.8 on 07/02/15, at home on lantus 40U daily with 5U tid with meals. Had hypoglycemia overnight corrected with D50. Passed SLP study. - CBGs ACHS - SSI moderate  - will add Lantus depending on PO intake and CBGs  - a1c pending  HTN at home on norvasc, clonidine, lisinopril. BPs elevated on admission - hold home meds in setting of acute  CVA for permissive HTN - restart home med clonidine if rebound hypertension  CKD stage 4 with anemia.  At home on ferrous sulfate. On admit Hgb 10.5 with baseline 10.2-11.5 and Cr 2.27 with baseline 2.1-2.5. - monitor CBC and BMP - avoid nephrotoxic medications  HLD at home on fenofibrate and atorvastatin. last lipid panel 12/29/14 - continue home meds - recheck lipid panel  GERD at home on protonix - continue home med  Gout at home on allopurinol.  - continue home med  Anxiety at home on paxil - continue home med  FEN/GI: SLIV, heart healthy/carb modified w/thin fluids, protonix, senna-docusate Prophylaxis: lovenox  Disposition: pending  Subjective:  No acute overnight events. Feels a little bit better than yesterday but cannot specify how. Didn't get much sleep due to tests.  Objective: Temp:  [98.1 F (36.7 C)-98.7 F (37.1 C)] 98.4 F (36.9 C) (08/11 0636) Pulse Rate:  [42-67] 67 (08/11 0636) Resp:  [13-21] 20 (08/11 0636) BP: (152-210)/(53-76) 186/71 (08/11 0636) SpO2:  [87 %-98 %] 92 % (08/11 0636) Weight:  [81.6 kg (180 lb)-83.6 kg (184 lb 3.2 oz)] 83.6 kg (184 lb 3.2 oz) (08/10 1949) Physical Exam: General: sitting comfortanly, NAD HEENT: L ptosis, pupils equally round and reactive to light and accommodation, visual fields intact, no lymphadenopathy, no thyromegaly Cardiovascular: RRR, no carotid bruits, no murmurs, rubs, or gallops Respiratory: CTAB, no increased work of breathing Abdomen: soft, nontender, nondistended, +bowel sounds MSK: moves all extremities, normal tone/strength Neuro: DTRs 2+, limited left medial gaze, no nystagmus noted Psych: appropriate affect and mood  Laboratory:  Recent Labs  Lab 09/03/15 1041  WBC 9.2  HGB 10.5*  HCT 34.0*  PLT 361    Recent Labs Lab 09/03/15 1041  NA 140  K 4.3  CL 102  CO2 27  BUN 52*  CREATININE 2.27*  CALCIUM 9.3  GLUCOSE 129*   Lipid Panel     Component Value Date/Time   CHOL 169  09/04/2015 0602   TRIG 188 (H) 09/04/2015 0602   HDL 32 (L) 09/04/2015 0602   CHOLHDL 5.3 09/04/2015 0602   VLDL 38 09/04/2015 0602   LDLCALC 99 09/04/2015 0602    Imaging/Diagnostic Tests: MR Brain wo contrast: small acute lacunar infarct in left midbrain near midline just anterior to the cerebral aqueduct MRA Head: Negative intracranial MRA, no correctable stenosis Echo: pending Carotid Dopplers: pending  Zandra Abts, Medical Student 09/04/2015, 7:35 AM [place] Intern pager: [phone], text pages welcome  UPPER LEVEL ADDENDUM  I have read the above note and made revisions highlighted in blue.  Smiley Houseman, MD PGY-2 [facility] Pager [phone]

## 2015-09-04 NOTE — Plan of Care (Signed)
Problem: Education: Goal: Knowledge of [facility] Education information/materials will improve Outcome: Progressing Patient oriented to unit, informed of patient safety procedures, stroke work-up explained and discussed with patient and family. Patient v/u.

## 2015-09-04 NOTE — Progress Notes (Signed)
*  PRELIMINARY RESULTS* Vascular Ultrasound Carotid Duplex (Doppler) has been completed.   Findings suggest 1-39% right internal carotid artery stenosis and high range 1-39% left internal carotid artery stenosis. Vertebral arteries are patent with antegrade flow.  09/04/2015 10:45 AM Maudry Mayhew, BS, RVT, RDCS, RDMS

## 2015-09-04 NOTE — Plan of Care (Signed)
Problem: Safety: Goal: Ability to remain free from injury will improve Outcome: Completed/Met Date Met: 09/04/15 Patient informed and compliant with patient safety policy. Patient continue to use appropriate form of communication to verbalize her physical and emotional needs.

## 2015-09-04 NOTE — Progress Notes (Signed)
[id number] Research protocol reviewed with patient and husband. Patient seems very interested in participating, however she remains undecided. Questions were encouraged and answered. Patient requested some time to think about consenting. I returned back to patients room a few hours later and she still was still undecided. Patient states "she already has a lot of doctors and isn't sure she wants to add one more thing on her plate". Patient is willing to take Informed consent home and think about participating. Informed patient that she had until Friday of next week to decide. I told her if she consented and was randomized to loop we could bring her back to hospital Thursday or Friday. Both Patient and husband verbalized understanding. I will touch base with them next week with a phone call.

## 2015-09-04 NOTE — Evaluation (Signed)
Clinical/Bedside Swallow Evaluation Patient Details  Name: Christine Santos MRN: [id number] Date of Birth: 1945-03-01  Today's Date: 09/04/2015 Time: SLP Start Time (ACUTE ONLY): 42 SLP Stop Time (ACUTE ONLY): 1105 SLP Time Calculation (min) (ACUTE ONLY): 15 min  Past Medical History:  Past Medical History:  Diagnosis Date  . Anemia 08/29/2011  . Arthritis    "legs" (01/22/2013)  . CAD (coronary artery disease)    s/p CABG in 1999  . CLL (chronic lymphoblastic leukemia)   . Diastolic heart failure ([place])   . GERD (gastroesophageal reflux disease)   . H/O hiatal hernia   . Heart murmur   . Hyperlipidemia   . Hypertension   . IDDM (insulin dependent diabetes mellitus) ([place])   . Myocardial infarction [facility])    "I had one but didn't know it" (01/22/2013)  . PAD (peripheral artery disease) ([place])   . Peripheral vascular disease [facility])    Past Surgical History:  Past Surgical History:  Procedure Laterality Date  . ANGIOPLASTY / STENTING FEMORAL Right 09/2010   SFA/notes 11/25/2010 (01/22/2013)  . ANGIOPLASTY / STENTING FEMORAL Left 11/2010   SFA/notes 11/25/2010 (01/22/2013)  . ANGIOPLASTY / STENTING ILIAC     [name] 11/25/2010 (01/22/2013)  . CARDIAC CATHETERIZATION     "I've had 2-4" (01/22/2013)  . CATARACT EXTRACTION W/ INTRAOCULAR LENS  IMPLANT, BILATERAL Bilateral ?2011  . CHOLECYSTECTOMY  1993  . CORONARY ARTERY BYPASS GRAFT  10/1994   "CABG X3"  . HEEL SPUR EXCISION Bilateral 1970's  . LOWER EXTREMITY ANGIOGRAM N/A 01/22/2013   Procedure: LOWER EXTREMITY ANGIOGRAM;  Surgeon: [name], MD;  Location: [facility] CATH LAB;  Service: Cardiovascular;  Laterality: N/A;  . SHOULDER OPEN ROTATOR CUFF REPAIR Bilateral 1990's   "2 times on 1 side; once on the other"  . TRANSLUMINAL ATHERECTOMY FEMORAL ARTERY Right 01/22/2013   & balloon  . TUBAL LIGATION  1980's   HPI:  Pt is a 70 y.o. female presenting to ED 8/10 with vision changes and unsteady gait. 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 Pt failed RN swallow screen in ED due to hx of dysphagia- noted that pt had an esophagram 7/26 showing a small hiatal hernia and very mild GERD. Bedside swallow eval ordered.    Assessment / Plan / Recommendation Clinical Impression  Pt with no overt s/s of aspiration and is presenting with a functional oropharyngeal swallow. Pt described hx of globus sensation "20 or 30 years ago" and again recently- esophagram showed mild hiatal hernia and mild GERD; pt attributes globus sensation to possible sinus infection with drainage. Reviewed reflux precautions with pt- sit upright 30-60 minutes after meal, small bites/ sips. Aspiration risk appears mild at this time. Recommend regular diet, thin liquids, meds whole with liquid. Will sign off at this time; please re-consult if needs arise.    Aspiration Risk  Mild aspiration risk    Diet Recommendation Regular;Thin liquid   Liquid Administration via: Cup;Straw Medication Administration: Whole meds with liquid Supervision: Patient able to self feed Compensations: Slow rate;Small sips/bites Postural Changes: Seated upright at 90 degrees;Remain upright for at least 30 minutes after po intake    Other  Recommendations Oral Care Recommendations: Oral care BID   Follow up Recommendations  None    Frequency and Duration            Prognosis  Swallow Study   General HPI: Pt is a 70 y.o. female presenting to ED 8/10 with vision changes and unsteady gait. MRI showed small acute lacunar infarct in L midbrain near midline and just anterior to cerebral aqueduct, questionable additional punctate lacunar infarct in dorsal R pons versus artifact. Pt failed RN swallow screen in ED due to hx of dysphagia- noted that pt had an esophagram 7/26 showing a small hiatal hernia and very mild GERD.  Bedside swallow eval ordered.  Type of Study: Bedside Swallow Evaluation Previous Swallow Assessment: esophagram June 2017- small hiatal hernia/ mild GERD Diet Prior to this Study: NPO Temperature Spikes Noted: No Respiratory Status: Room air History of Recent Intubation: No Behavior/Cognition: Alert;Cooperative;Pleasant mood Oral Cavity Assessment: Within Functional Limits Oral Cavity - Dentition: Adequate natural dentition Vision: Functional for self-feeding Self-Feeding Abilities: Able to feed self Patient Positioning: Upright in bed Baseline Vocal Quality: Normal Volitional Cough: Strong Volitional Swallow: Able to elicit    Oral/Motor/Sensory Function Overall Oral Motor/Sensory Function: Within functional limits   Ice Chips Ice chips: Not tested   Thin Liquid Thin Liquid: Within functional limits Presentation: Cup;Straw    Nectar Thick Nectar Thick Liquid: Not tested   Honey Thick Honey Thick Liquid: Not tested   Puree Puree: Within functional limits Presentation: Self Fed;Spoon   Solid   GO   Solid: Within functional limits Presentation: Self Patton Salles, Emarion Toral K, MA, CCC-SLP 09/04/2015,11:15 AM [phone]

## 2015-09-04 NOTE — Care Management Note (Signed)
Case Management Note  Patient Details  Name: Christine Santos MRN: [id number] Date of Birth: October 28, 1945  Subjective/Objective: Pt admitted with CVA. She is from home.                 Action/Plan: Awaiting PT recommendations. OT recommending SNF. CM following for d/c needs.   Expected Discharge Date:                  Expected Discharge Plan:  [place]  In-House Referral:     Discharge planning Services  CM Consult  Post Acute Care Choice:    Choice offered to:     DME Arranged:    DME Agency:     HH Arranged:    HH Agency:     Status of Service:     If discussed at [employer], dates discussed:    Additional Comments:  Pollie Friar, RN 09/04/2015, 2:06 PM

## 2015-09-04 NOTE — Progress Notes (Signed)
Teaching services paged. Patient NPO, CBG 58 patient is asymptomatic. Patients BP 185/65. RN will continue to monitor.

## 2015-09-04 NOTE — Progress Notes (Addendum)
25 mL D50 IV administered to patient. RN spoke with teaching services at 0113 D5 1/2NS at 50 mL/h started at 0152. RN continue to monitor.

## 2015-09-04 NOTE — Evaluation (Signed)
Occupational Therapy Evaluation Patient Details Name: Christine Santos MRN: [id number] DOB: Feb 23, 1945 Today's Date: 09/04/2015    History of Present Illness pt was admitted with blurry vision then diplopia and unsteady gait.  PMH:  CAD, CLL (remission), CHF, HTN and DM   Clinical Impression   This 70 year old female was admitted and found to have small lacunar infarct at L midbrain.  Of note, she has a L eyelid droop as a comorbidity.  Pt will benefit from continued OT in acute setting.  She was independent prior to admission and now need min A overall.      Follow Up Recommendations  Supervision/Assistance - 24 hour;SNF    Equipment Recommendations   (to be further assessed)    Recommendations for Other Services       Precautions / Restrictions Precautions Precautions: Fall Restrictions Weight Bearing Restrictions: No      Mobility Bed Mobility Overal bed mobility: Independent                Transfers Overall transfer level: Needs assistance Equipment used: 1 person hand held assist Transfers: Sit to/from Stand Sit to Stand: Min assist         General transfer comment: steadying assistance.  Pt has a tendency to move quickly    Balance                                            ADL Overall ADL's : Needs assistance/impaired Eating/Feeding: NPO   Grooming: Set up;Sitting   Upper Body Bathing: Set up;Sitting   Lower Body Bathing: Minimal assistance;Sit to/from stand   Upper Body Dressing : Minimal assistance;Sitting (lines, gown as robe)   Lower Body Dressing: Minimal assistance;Sit to/from stand                 General ADL Comments: ambulated to sink with min A, gait belt and hand held assist.  Pt did not experience any diplopia with this.  Returned to bed and set walker up, to work on moving head and checking for diplopia, but RN came in to say that pt will have ECHO.       Vision Vision Assessment?: Yes;Vision  impaired- to be further tested in functional context Additional Comments: need to repeat tracking:  pt had momentary diplopia with horizontal tracking to L.  Eyes did not look conjugate whole time.  She did not have diplopia looking at clock nor when she walked to the sink. She states she has been having more diplopia when she is walking   Perception     Praxis      Pertinent Vitals/Pain Pain Assessment: No/denies pain     Hand Dominance     Extremity/Trunk Assessment Upper Extremity Assessment Upper Extremity Assessment: Overall WFL for tasks assessed           Communication Communication Communication: No difficulties   Cognition Arousal/Alertness: Awake/alert Behavior During Therapy: WFL for tasks assessed/performed Overall Cognitive Status: Within Functional Limits for tasks assessed (cues for safety: pt moves quickly)                     General Comments       Exercises       Shoulder Instructions      Home Living Family/patient expects to be discharged to:: Private residence  Did not ask all home questions this  session:  Will gather information on next visit.                                       Prior Functioning/Environment Level of Independence: Independent             OT Diagnosis: Generalized weakness   OT Problem List: Decreased strength;Impaired balance (sitting and/or standing);Impaired vision/perception;Decreased knowledge of use of DME or AE   OT Treatment/Interventions: Self-care/ADL training;DME and/or AE instruction;Therapeutic activities;Patient/family education (vision compensation if needed)    OT Goals(Current goals can be found in the care plan section) Acute Rehab OT Goals Patient Stated Goal: return to independence OT Goal Formulation: With patient Time For Goal Achievement: 09/18/15 Potential to Achieve Goals: Good ADL Goals Pt Will Transfer to Toilet: with supervision;ambulating;regular height  toilet Pt Will Perform Toileting - Clothing Manipulation and hygiene: with supervision;sit to/from stand Pt Will Perform Tub/Shower Transfer: Tub transfer;Shower transfer;with min guard assist;ambulating;shower seat Additional ADL Goal #1: pt will gather clothes at supervision level and complete ADL from sit to stand  OT Frequency: Min 2X/week   Barriers to D/C:            Co-evaluation              End of Session    Activity Tolerance: Patient tolerated treatment well Patient left: in bed;with call bell/phone within reach;with nursing/sitter in room;with bed alarm set;with family/visitor present   Time: [id number] OT Time Calculation (min): 20 min Charges:  OT General Charges $OT Visit: 1 Procedure OT Evaluation $OT Eval Moderate Complexity: 1 Procedure G-Codes:    Jamaree Hosier 2015-09-15, 8:59 AM Lesle Chris, OTR/L [phone] 15-Sep-2015

## 2015-09-04 NOTE — Evaluation (Signed)
Physical Therapy Evaluation Patient Details Name: Christine Santos MRN: [id number] DOB: [birth year] Today's Date: 09/04/2015   History of Present Illness  pt was admitted with blurry vision then diplopia and unsteady gait.  PMH:  CAD, CLL (remission), CHF, HTN and DM  Clinical Impression  Patient presents with decreased independence with mobility due to deficits listed in PT problem list.  She will benefit from skilled PT in the acute setting to allow return home following SNF level rehab stay.  Has assist from her friend she lives with who is visually impaired, but feel safest d/c plan is SNF at this time due to high fall risk with poor deficit awareness.     Follow Up Recommendations Supervision for mobility/OOB;SNF    Equipment Recommendations  Rolling walker with 5" wheels    Recommendations for Other Services       Precautions / Restrictions Precautions Precautions: Fall      Mobility  Bed Mobility Overal bed mobility: Needs Assistance Bed Mobility: Supine to Sit;Sit to Supine     Supine to sit: Supervision;HOB elevated Sit to supine: Supervision   General bed mobility comments: cues for positioning back to supine  Transfers Overall transfer level: Needs assistance Equipment used: 1 person hand held assist Transfers: Sit to/from Stand Sit to Stand: Min assist         General transfer comment: leaning posteriorly initially   Ambulation/Gait Ambulation/Gait assistance: Min assist Ambulation Distance (Feet): 160 Feet Assistive device: 1 person hand held assist;Straight cane Gait Pattern/deviations: Step-through pattern;Staggering left;Wide base of support;Decreased stride length     General Gait Details: LOB to R in room, staggered to L in hallway and increased assist given, initially with HHA, then used cane, but still needed minguard for safety; did report continued double vision and some weakness as hadn't eaten in 24 hours till lunch  Stairs             Wheelchair Mobility    Modified Rankin (Stroke Patients Only) Modified Rankin (Stroke Patients Only) Pre-Morbid Rankin Score: No symptoms Modified Rankin: Moderately severe disability     Balance Overall balance assessment: Needs assistance Sitting-balance support: Feet supported Sitting balance-Leahy Scale: Good       Standing balance-Leahy Scale: Fair Standing balance comment: can stand without UE support statically, needs UE assist for ambulation                 Standardized Balance Assessment Standardized Balance Assessment : Berg Balance Test Berg Balance Test Sit to Stand: Needs minimal aid to stand or to stabilize Standing Unsupported: Able to stand 2 minutes with supervision Sitting with Back Unsupported but Feet Supported on Floor or Stool: Able to sit safely and securely 2 minutes Stand to Sit: Controls descent by using hands Transfers: Able to transfer safely, definite need of hands Standing Unsupported with Eyes Closed: Able to stand 10 seconds with supervision Standing Ubsupported with Feet Together: Able to place feet together independently but unable to hold for 30 seconds From Standing, Reach Forward with Outstretched Arm: Can reach forward >12 cm safely (5") From Standing Position, Pick up Object from Floor: Able to pick up shoe, needs supervision From Standing Position, Turn to Look Behind Over each Shoulder: Looks behind one side only/other side shows less weight shift Turn 360 Degrees: Needs assistance while turning Standing Unsupported, Alternately Place Feet on Step/Stool: Able to complete >2 steps/needs minimal assist Standing Unsupported, One Foot in Front: Able to plae foot ahead of the other independently and hold  30 seconds Standing on One Leg: Unable to try or needs assist to prevent fall Total Score: 32         Pertinent Vitals/Pain Pain Assessment: No/denies pain    Home Living Family/patient expects to be discharged to:: Private  residence Living Arrangements: Non-relatives/Friends Available Help at Discharge: Friend(s);Available 24 hours/day Type of Home: Mobile home Home Access: Stairs to enter Entrance Stairs-Rails: None Entrance Stairs-Number of Steps: 1 Home Layout: One level Home Equipment: Cane - single point;Grab bars - tub/shower;Shower seat Additional Comments: Lives with a friend who is visually impaired, but able to assist as needed    Prior Function Level of Independence: Independent               Hand Dominance        Extremity/Trunk Assessment   Upper Extremity Assessment: Defer to OT evaluation           Lower Extremity Assessment: RLE deficits/detail;LLE deficits/detail RLE Deficits / Details: AROM WFL, strength hip flexion 3+/5, knee extension 4/5, ankle DF 4/5; intact sensation to light touch LLE Deficits / Details: AROM WFL, strength hip flexion 3+/5, knee extension 4/5, ankle DF 4/5; intact sensation to light touch     Communication   Communication: No difficulties  Cognition Arousal/Alertness: Awake/alert Behavior During Therapy: WFL for tasks assessed/performed Overall Cognitive Status: Within Functional Limits for tasks assessed                      General Comments General comments (skin integrity, edema, etc.): Educated pt and friend in fall prevention and in need for walker for preventing falls; educated friend in assist level for safety at home with stepping up on step as well as for transfers and ambulation initially    Exercises        Assessment/Plan    PT Assessment Patient needs continued PT services  PT Diagnosis Generalized weakness;Abnormality of gait   PT Problem List Decreased balance;Decreased knowledge of use of DME;Decreased safety awareness;Decreased strength  PT Treatment Interventions DME instruction;Gait training;Stair training;Functional mobility training;Balance training;Therapeutic exercise;Therapeutic activities   PT Goals  (Current goals can be found in the Care Plan section) Acute Rehab PT Goals Patient Stated Goal: return to independence PT Goal Formulation: With patient Time For Goal Achievement: 09/11/15 Potential to Achieve Goals: Good    Frequency Min 4X/week   Barriers to discharge Decreased caregiver support friend she lives with is visually impaired    Co-evaluation               End of Session Equipment Utilized During Treatment: Gait belt Activity Tolerance: Patient tolerated treatment well Patient left: in bed;with call bell/phone within reach;with bed alarm set;with family/visitor present           Time: 1410-1447 PT Time Calculation (min) (ACUTE ONLY): 37 min   Charges:   PT Evaluation $PT Eval Moderate Complexity: 1 Procedure PT Treatments $Gait Training: 8-22 mins   PT G CodesReginia Naas 14-Sep-2015, 3:46 PM  Magda Kiel, [place] 2015-09-14

## 2015-09-04 NOTE — Progress Notes (Signed)
STROKE TEAM PROGRESS NOTE   HISTORY OF PRESENT ILLNESS (per record) Christine Santos is an 70 y.o. female with history of CAD (now on ASA, plavix), CLL, CHF, HTN, HLD, IDDM, GERD who presents to the ED for evaluation of blurred vision/double vision and dizziness.  She states yesterday at 1000 she noted she felt dizzy but not vertigo. She thought it was her blood sugar and took a glucose pill. This persisted and at 1600 she noted horizontal diplopia and felt off balance due to her vision. Due to not returning to baseline she came to ED today. Currently she has diplopia on upward and horizontal gaze and her husband states her left eye has a new ptosis. BP is 166/93 which is elevated more than normal.  She states she takes her antiplatelets every day. In addition she has been bradycardic while in ED with pulse of 42. She was last known well 09/03/2015 at 10 AM. Patient was not administered IV t-PA secondary to being outside of the window. She was admitted for further evaluation and treatment.   SUBJECTIVE (INTERVAL HISTORY) Her significant other of 40 years is at the bedside.  Overall she feels her condition is stable.    OBJECTIVE Temp:  [97.9 F (36.6 C)-98.7 F (37.1 C)] 97.9 F (36.6 C) (08/11 1052) Pulse Rate:  [51-72] 66 (08/11 1052) Cardiac Rhythm: Normal sinus rhythm (08/11 0700) Resp:  [15-21] 18 (08/11 1052) BP: (158-189)/(53-90) 177/72 (08/11 1052) SpO2:  [91 %-98 %] 97 % (08/11 1052) Weight:  [83.6 kg (184 lb 3.2 oz)] 83.6 kg (184 lb 3.2 oz) (08/10 1949)  CBC:  Recent Labs Lab 09/03/15 1041  WBC 9.2  NEUTROABS 6.7  HGB 10.5*  HCT 34.0*  MCV 98.6  PLT [id number]    Basic Metabolic Panel:  Recent Labs Lab 09/03/15 1041  NA 140  K 4.3  CL 102  CO2 27  GLUCOSE 129*  BUN 52*  CREATININE 2.27*  CALCIUM 9.3    Lipid Panel:    Component Value Date/Time   CHOL 169 09/04/2015 0602   TRIG 188 (H) 09/04/2015 0602   HDL 32 (L) 09/04/2015 0602   CHOLHDL 5.3 09/04/2015 0602    VLDL 38 09/04/2015 0602   LDLCALC 99 09/04/2015 0602   HgbA1c:  Lab Results  Component Value Date   HGBA1C 6.8 07/02/2015   Urine Drug Screen: No results found for: LABOPIA, COCAINSCRNUR, LABBENZ, AMPHETMU, THCU, LABBARB    IMAGING  Ct Head Wo Contrast  Result Date: 09/03/2015 CLINICAL DATA:  Head pressure and dizziness for 2 days, initial encounter EXAM: CT HEAD WITHOUT CONTRAST TECHNIQUE: Contiguous axial images were obtained from the base of the skull through the vertex without intravenous contrast. COMPARISON:  11/26/2008 FINDINGS: The bony calvarium is intact. No gross soft tissue abnormality is seen. A rounded area decreased attenuation is noted at the inferior aspect of the basal ganglia on the right consistent with prior lacunar infarct. This is stable from the prior exam. Hypodensity is noted within the pons on the left also consistent with prior lacunar infarct stable from the prior exam. Chronic white matter ischemic changes are noted in the deep white matter bilaterally also stable from the prior study. No findings to suggest acute hemorrhage, acute infarction or space-occupying mass lesion are noted. IMPRESSION: Changes consistent with prior ischemia.  No acute abnormality noted. Electronically Signed   By: Inez Catalina M.D.   On: 09/03/2015 11:05   Mr Brain Wo Contrast  Result Date: 09/03/2015 CLINICAL DATA:  70 year old female with left eye droop, dizziness, unsteady gait, blurred vision, diplopia since 1000 hours yesterday. Initial encounter. EXAM: MRI HEAD WITHOUT CONTRAST TECHNIQUE: Multiplanar, multiecho pulse sequences of the brain and surrounding structures were obtained without intravenous contrast. COMPARISON:  Head CT without contrast 1059 hours today. Brain MRI 11/26/2008. FINDINGS: Major intracranial vascular flow voids are stable, with a degree of intracranial artery dolichoectasia again noted. There is a nodular and somewhat linear area of restricted diffusion  encompassing 5-6 mm in the left medial midbrain, just to the left and anterior to the cerebral aqueduct (series 4, image 21 and series 5, image 14). Mild T2 hyperintensity without associated hemorrhage or mass effect. There might also be a second punctate focus of restricted diffusion in the right dorsal pons as seen on series 4, image 14, or this might be artifact. Extensive underlying chronic small vessel ischemic disease in the brainstem including multiple chronic lacunar infarcts in the pons. A chronic lacunar infarct in the inferior left thalamus is new since 2010. Incidental inferior basal ganglia perivascular spaces also noted. Several tiny chronic lacunar infarcts in the posterior cerebellar hemispheres. Widespread and occasionally patchy cerebral white matter T2 and FLAIR hyperintensity appears mildly progressed since 2010, mostly subcortical. No cortical encephalomalacia. No chronic cerebral blood products. No midline shift, mass effect, evidence of mass lesion, ventriculomegaly, extra-axial collection or acute intracranial hemorrhage. Cervicomedullary junction and pituitary are within normal limits. Negative visualized cervical spine. Visible internal auditory structures appear normal. Mastoids remain clear. Trace paranasal sinus mucosal thickening is stable. Interval postoperative changes to both globes, otherwise negative orbits soft tissues. Negative scalp soft tissues. Stable visible bone marrow signal. IMPRESSION: 1. 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 This is likely affecting a component of the pathway for control of left eye movement in this clinical setting. No associated hemorrhage or mass effect. 2. Questionable additional punctate lacunar infarct in the dorsal right pons, versus artifact. 3. Underlying severe chronic small vessel disease in the brainstem. Mild progression of chronic small vessel disease at in the cerebral white  matter and left thalamus. Electronically Signed   By: Genevie Ann M.D.   On: 09/03/2015 15:32   Mr Jodene Nam Head/brain [id number] Cm  Result Date: 09/04/2015 CLINICAL DATA:  Initial evaluation for acute stroke. EXAM: MRA HEAD WITHOUT CONTRAST TECHNIQUE: Angiographic images of the Circle of Willis were obtained using MRA technique without intravenous contrast. COMPARISON:  Prior MRI from 09/03/2015. FINDINGS: ANTERIOR CIRCULATION: Visualized distal cervical segments of the internal carotid arteries are widely patent with antegrade flow. Petrous segments widely patent. Mild scattered atheromatous irregularity within the cavernous ICAs bilaterally without high-grade stenosis. A1 segments patent bilaterally. Anterior communicating artery normal. Anterior cerebral arteries well opacified to their distal aspects. M1 segments patent without stenosis or occlusion. No proximal M2 stenosis or occlusion. Distal MCA branches well opacified and symmetric. POSTERIOR CIRCULATION: Vertebral arteries patent to the vertebrobasilar junction. Left vertebral artery slightly dominant. Mild atheromatous irregularity within the right V4 segment without high-grade stenosis. Posterior inferior cerebral arteries patent bilaterally. Basilar artery widely patent to its distal aspect without basilar tip stenosis or occlusion. Superior cerebellar arteries patent bilaterally. Probable short-segment moderate stenosis within the proximal right SCA noted (series 306, image 15). Right SCA appears to arise from the right P1 segment. Both of the PCAs arise from the basilar artery and are well opacified to their distal aspects without high-grade stenosis or occlusion. No aneurysm or vascular malformation. IMPRESSION: 1.  Negative intracranial MRA. No large or proximal arterial branch occlusion. No high-grade or correctable stenosis. 2. Relatively mild for age atheromatous disease involving the anterior and posterior circulation as detailed above. Electronically Signed    By: Jeannine Boga M.D.   On: 09/04/2015 05:52   Carotid Doppler   There is 1-39% bilateral ICA stenosis. Vertebral artery flow is antegrade.    2-D echocardiogram - Left ventricle: The cavity size was normal. Wall thickness was increased in a pattern of mild LVH. Systolic function was normal. The estimated ejection fraction was in the range of 55% to 60%. Wall motion was normal; there were no regional wall motion abnormalities. Doppler parameters are consistent with abnormal left ventricular relaxation (grade 1 diastolic dysfunction). Doppler parameters are consistent with high ventricular filling pressure. - Mitral valve: Calcified annulus. Impressions:   Normal LV systolic function; mild LVH; grade 1 diastolic dysfunction with elevated LV filling pressure.   PHYSICAL EXAM Pleasant elderly lady not in distress. . Afebrile. Head is nontraumatic. Neck is supple without bruit.    Cardiac exam no murmur or gallop. Lungs are clear to auscultation. Distal pulses are well felt. Neurology Exam :  Mental Status: Alert, oriented, thought content appropriate.  Speech fluent without evidence of aphasia.  Able to follow 3 step commands without difficulty. Cranial Nerves: II:  Visual fields grossly normal, pupils equal, round, reactive to light and accommodation III,IV, VI: ptosis present left eye, extra-ocular movements intact but shows limited left medial gaze and limited right upward gaze hypotropic left eye.   .  V,VII: smile symmetric, facial light touch sensation normal bilaterally VIII: hearing normal bilaterally IX,X: uvula rises symmetrically XI: bilateral shoulder shrug XII: midline tongue extension Motor: Right :            Upper extremity   5/5                                                                              Left:     Upper extremity   5/5                       Lower extremity   5/5                                                                                                     Lower extremity   5/5 Tone and bulk:normal tone throughout; no atrophy noted Sensory: Pinprick and light touch intact throughout, bilaterally Deep Tendon Reflexes: 2+ and symmetric throughout Plantars: Right: downgoing  Left: downgoing Cerebellar: normal finger-to-nose, ataxic right heel to shin.  Gait: not tested ASSESSMENT/PLAN Ms. Christine Santos is a 70 y.o. female with history of chronic lymphocytic leukemia, coronary artery disease, CHF, hypertension, hyperlipidemia, insulin-dependent diabetes and GERD presenting with blurred vision/double vision and dizziness. She did not receive IV t-PA due to delay in arrival.   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 In June  Lovenox 30 mg sq daily for VTE prophylaxis  Diet heart healthy/carb modified Room service appropriate? Yes; Fluid consistency: Thin  aspirin 81 mg daily and clopidogrel 75 mg daily prior to admission, received 300 mg aspirin suppository 1, now on clopidogrel 75 mg daily. Given stents, recommend ongoing dual antiplatelets at discharge.  Patient counseled to be compliant with her antithrombotic medications  Ongoing aggressive stroke risk factor management  Interested in Stroke AFIB, research staff will follow up with her   Therapy recommendations:  Supervision. Rolling walker with 5 inch wheels  Disposition:  pending   Follow-up with Dr. Leonie Man as an outpatient in 2 months. Order has been written.  Hypertension Blood pressure is elevated, 140s to upper 180s Permissive hypertension (OK if < 220/120) but gradually normalize in 5-7 days Long-term BP goal normotensive  Hyperlipidemia  Home meds:  Lipitor 80 mg daily,  resumed in hospital  Also on fenofibrate  LDL 99, goal < 70  Continue statin at discharge  Diabetes  HgbA1c 6.8. In June, at goal < 7.0  Controlled  Other Stroke Risk Factors  Advanced age  Obesity, Body mass index is 29.73 kg/m., recommend weight loss, diet and exercise as appropriate   Coronary artery disease - status post CABG in 1999, MI  Peripheral arterial disease/peripheral vascular disease-Angioplasty/stent R femoral, L femoral and iliac stent  Diastolic heart failure  Other Active Problems  Chronic kidney disease stage IV, with anemia  GERD  Gout  Anxiety  Hospital day # [place] for Pager information 09/04/2015 5:13 PM  I have personally examined this patient, reviewed notes, independently viewed imaging studies, participated in medical decision making and plan of care. I have made any additions or clarifications directly to the above note. Agree with note above. She presented with vision difficulties and diplopia secondary to small brainstem infarct etiology likely small vessel disease. Continue dual antiplatelet therapy with aspirin and Plavix given history of peripheral vascular stents. She remains at risk for neurological worsening, recurrent stroke, TIA and needs ongoing stroke workup. Had a long discussion with the patient and husband regarding her stroke risk and answered questions. Greater than 50% time during this 35 minute visit was spent on counseling and coordination of care about stroke risk, prevention and treatment.  Antony Contras, MD Medical Director [place] Pager: [phone] 09/04/2015 5:48 PM    To contact Stroke Continuity provider, please refer to [email or web address] After hours, contact General Neurology

## 2015-09-05 DIAGNOSIS — R42 Dizziness and giddiness: Secondary | ICD-10-CM

## 2015-09-05 LAB — GLUCOSE, CAPILLARY
GLUCOSE-CAPILLARY: 200 mg/dL — AB (ref 65–99)
Glucose-Capillary: 179 mg/dL — ABNORMAL HIGH (ref 65–99)

## 2015-09-05 LAB — HEMOGLOBIN A1C
HEMOGLOBIN A1C: 7.2 % — AB (ref 4.8–5.6)
MEAN PLASMA GLUCOSE: 160 mg/dL

## 2015-09-05 MED ORDER — ONDANSETRON HCL 4 MG/2ML IJ SOLN
4.0000 mg | Freq: Three times a day (TID) | INTRAMUSCULAR | Status: DC | PRN
  Administered 2015-09-05: 4 mg via INTRAVENOUS
  Filled 2015-09-05: qty 2

## 2015-09-05 MED ORDER — ASPIRIN 81 MG PO CHEW
81.0000 mg | CHEWABLE_TABLET | Freq: Every day | ORAL | Status: DC
  Administered 2015-09-05: 81 mg via ORAL
  Filled 2015-09-05: qty 1

## 2015-09-05 MED ORDER — HYDRALAZINE HCL 20 MG/ML IJ SOLN
5.0000 mg | Freq: Once | INTRAMUSCULAR | Status: AC
  Administered 2015-09-05: 5 mg via INTRAVENOUS
  Filled 2015-09-05: qty 1

## 2015-09-05 MED ORDER — ZOLPIDEM TARTRATE 5 MG PO TABS
5.0000 mg | ORAL_TABLET | Freq: Every evening | ORAL | Status: AC | PRN
  Administered 2015-09-05: 5 mg via ORAL
  Filled 2015-09-05: qty 1

## 2015-09-05 MED ORDER — FLUTICASONE PROPIONATE 50 MCG/ACT NA SUSP
2.0000 | Freq: Every day | NASAL | Status: DC
  Administered 2015-09-05: 2 via NASAL
  Filled 2015-09-05: qty 16

## 2015-09-05 MED ORDER — SODIUM CHLORIDE 0.9 % IV SOLN: 8.0000 mg | Freq: Three times a day (TID) | INTRAVENOUS | Status: DC | PRN

## 2015-09-05 MED ORDER — AMLODIPINE BESYLATE 10 MG PO TABS
10.0000 mg | ORAL_TABLET | Freq: Every day | ORAL | Status: DC
  Administered 2015-09-05: 10 mg via ORAL
  Filled 2015-09-05: qty 1

## 2015-09-05 NOTE — Progress Notes (Signed)
Spoke with Pt regarding discharge. Discussed with Pt that PT and OT have recommended SNF placement and that we feel it would be safest for her to go SNF. Pt states that she has no desire to go to SNF and would like to go home. Pt states that she understands our recommendation and why we feel it is safest for her to go to SNF, but she still does not want to go. She will have 24 hour supervision at home. Given that she is refusing SNF, I will order home health PT and OT as well as the equipment recommended by PT.  Hyman Bible, MD PGY-2

## 2015-09-05 NOTE — Progress Notes (Signed)
Resident report that she got paged by neurologist ( Dr Leonie Man) team that patient can be discharged today. Their note today is incomplete and suggested that patient need ongoing stroke evaluation. Need to clarify if this is outpatient or inpatient evaluation they meant. I paged him but no response yet to clarify. Resident's advised to clarify this with neurologist and document before discharging patient. She is otherwise medically stable.

## 2015-09-05 NOTE — Discharge Instructions (Signed)
It was so nice to meet you!  You were hospitalized because you had a small stroke that caused your eyelid to droop and your vision to be blurred. We recommended that you be discharged to a skilled nursing facility, but you preferred going home with home health services. I have ordered home health physical therapy and occupational therapy to come to your home. I have also ordered a walker to help you get around your house easier.   Please make sure you follow-up with your primary care doctor and the stroke doctor, Dr. Leonie Man.

## 2015-09-05 NOTE — Progress Notes (Signed)
Discussed with Neurology this AM that Christine Santos is stable for discharge. She will be discharged to follow with Dr Leonie Man as an outpatient  Cniyah Sproull A. Lincoln Brigham MD, [facility] Resident PGY-3 Pager [phone]

## 2015-09-05 NOTE — Care Management Note (Signed)
Case Management Note  Patient Details  Name: Christine Santos MRN: [id number] Date of Birth: 1945-11-06  Subjective/Objective:                  CVA Action/Plan: Discharge planning Expected Discharge Date:  09/05/15               Expected Discharge Plan:  [place]  In-House Referral:     Discharge planning Services  CM Consult  Post Acute Care Choice:  Home Health Choice offered to:  Patient  DME Arranged:  Walker rolling DME Agency:  [facility] Arranged:  PT, OT [facility] Agency:  [place]  Status of Service:  Completed, signed off  If discussed at [place] of Stay Meetings, dates discussed:    Additional Comments: CM spoke with pt to offer choice of home health agency.  Pt chooses [facility] to render HHPT/OT.  Referral called to [facility] rep, Tiffany.  CM notified Reggie to please deliver the rolling walker to room so pt can discharge.  No other CM needs were communicated. [name], RN 09/05/2015, 2:28 PM

## 2015-09-05 NOTE — Progress Notes (Signed)
Family Medicine Teaching Service Daily Progress Note Intern Pager: [phone]  Patient name: Christine Santos Medical record number: [id number] Date of birth: 1945/02/12 Age: 70 y.o. Gender: female  Primary Care Provider: Zigmund Gottron, MD Consultants: neurology Code Status: FULL  Pt Overview and Major Events to Date:  Admitted on 09/03/15  Assessment and Plan: Christine Santos is a 70 y.o. female presenting with vision changes and unsteady gait. PMH is significant for CAD (now on ASA, plavix), CLL(in remission), CHF, HTN, HLD, IDDM, GERD.  Acute CVA. MRI with small acute lacunar infarct in left midbrain. On exam, good strength in upper and lower extremities, left lid droop. A1c 7.2%. ECHO with EF 55-60%, mild LVH, G1DD. Carotid dopplers unremarkable. - Neuro consulted, appreciate recs. Will touch base with Neuro today to see if there is anything else she needs to have done this hospitalization. - continue ASA 81mg  , Plavix 75mg , atorvastatin 80mg   - neuro checks - SLP recommend regular diet and thin liquids. Signed off. - PT/OT recommending SNF. Pt states she wants to go home with home health. - SW consulted for SNF placement.   Vomiting: Likely related to increased phlegm in the setting of chronic sinusitis. Per Pt, she sometimes vomits at home in the morning when she gags on her phlegm and this is what happened this morning. - Zofran IV prn - Restart home Flonase  H/o diastolic CHF w/ h/o CAD s/p CABG 1996. Stable; not in acute exacerbation. at home on lasix, metoprolol, lisinopril. Follows with cardiology for her CAD; last seen 06/2015 - Restart Norvasc today, holding Lasix and Lisinopril for now - continue home statin  T2DM A1c 7.2%, at home on lantus 40U daily with 5U tid with meals. Has had hypoglycemia this admission, but CBGs have since improved. - CBGs ACHS - SSI moderate  - Can add small dose of Lantus today if blood sugars elevated.  HTN. at home on norvasc,  clonidine, lisinopril. - s/p permissive hypertension x 48 hours, will now restart home Norvasc. - Can add Lisinopril back if BPs remain elevated today  CKD stage 4 with anemia, stable.  At home on ferrous sulfate. On admit Hgb 10.5 with baseline 10.2-11.5 and Cr 2.27 with baseline 2.1-2.5. - monitor CBC and BMP - avoid nephrotoxic medications  HLD, stable: at home on fenofibrate and atorvastatin 80mg . Lipid panel this admission with LDL 99. - continue home meds  GERD: at home on protonix - continue home med  Gout: at home on allopurinol. No evidence of acute flare. - continue home med  Anxiety, stable: at home on paxil - continue home med  FEN/GI: SLIV, heart healthy/carb modified w/thin fluids, protonix, senna-docusate Prophylaxis: lovenox  Disposition: PT/OT recommending SNF. SW consulted for placement. Pt wants to go home with [facility] services. If refusing SNF placement, can likely discharge home today with [place].  Subjective:  Pt states she is doing fine this morning. She would like to go home. She states that the physical therapist told her she was safe to go home with home health services. She still feels weak in her legs bilaterally when she walks, but she states she does fine if she has something to hold on to.   Objective: Temp:  [97.5 F (36.4 C)-99.1 F (37.3 C)] 98.4 F (36.9 C) (08/12 0650) Pulse Rate:  [66-94] 93 (08/12 0650) Resp:  [18] 18 (08/12 0650) BP: (142-222)/(53-91) 189/91 (08/12 0650) SpO2:  [94 %-98 %] 96 % (08/12 0650) Physical Exam: General: laying in bed, NAD  HEENT: L ptosis, pupils equally round and reactive to light and accommodation, visual fields intact, no lymphadenopathy, no thyromegaly Cardiovascular: RRR, no carotid bruits, no murmurs, rubs, or gallops Respiratory: CTAB, no increased work of breathing Abdomen: soft, nontender, nondistended, +bowel sounds MSK: moves all extremities, normal tone/strength Neuro: DTRs 2+, limited left medial  gaze, no nystagmus noted, 5/5 strength in upper and lower extremities bilaterally Psych: appropriate affect and mood  Laboratory:  Recent Labs Lab 09/03/15 1041  WBC 9.2  HGB 10.5*  HCT 34.0*  PLT 361    Recent Labs Lab 09/03/15 1041  NA 140  K 4.3  CL 102  CO2 27  BUN 52*  CREATININE 2.27*  CALCIUM 9.3  GLUCOSE 129*   Lipid Panel     Component Value Date/Time   CHOL 169 09/04/2015 0602   TRIG 188 (H) 09/04/2015 0602   HDL 32 (L) 09/04/2015 0602   CHOLHDL 5.3 09/04/2015 0602   VLDL 38 09/04/2015 0602   LDLCALC 99 09/04/2015 0602    Imaging/Diagnostic Tests: MR Brain wo contrast: small acute lacunar infarct in left midbrain near midline just anterior to the cerebral aqueduct MRA Head: Negative intracranial MRA, no correctable stenosis Echo: EF 55-60%, mild LVH, G1DD Carotid Dopplers: negative for stenosis  Sela Hua, MD 09/05/2015, 8:41 AM PGY-2, [facility] Intern pager: [phone], text pages welcome

## 2015-09-07 ENCOUNTER — Encounter: Payer: Self-pay | Admitting: *Deleted

## 2015-09-07 LAB — GLUCOSE, CAPILLARY: Glucose-Capillary: 202 mg/dL — ABNORMAL HIGH (ref 65–99)

## 2015-09-07 NOTE — Progress Notes (Signed)
Received call form Mrs. Anhorn about desire to participate in the [facility] research study. Appointment scheduled for patient to Sign consent and randomize 09/08/15.

## 2015-09-07 NOTE — Discharge Summary (Signed)
[facility] Discharge Summary  Patient name: Christine Santos Medical record number: [id number] Date of birth: 18-Mar-1945 Age: 70 y.o. Gender: female Date of Admission: 09/03/2015  Date of Discharge: 09/05/15 Admitting Physician: Kinnie Feil, MD  Primary Care Provider: Zigmund Gottron, MD Consultants: neurology  Indication for Hospitalization: vision changes and unsteady gait  Discharge Diagnoses/Problem List:  Patient Active Problem List   Diagnosis Date Noted  . Dizziness   . Diplopia   . Acute lacunar infarction ([place]) 09/03/2015  . CVA (cerebral infarction) 09/03/2015  . Sinusitis, chronic 08/05/2015  . Need for hepatitis C screening test 07/02/2015  . Weight loss, unintentional 07/02/2015  . Chronic kidney disease   . Diabetes mellitus type 2 with neurological manifestations ([place]) 02/26/2014  . Hyperkalemia 06/21/2013  . Diabetic nephropathy ([place]) 05/23/2013  . Diabetic neuropathy ([place]) 11/30/2012  . Macular degeneration 07/04/2012  . Diabetic retinopathy ([place]) 03/20/2012  . Osteopenia 09/22/2011  . Foot pain 09/21/2011  . Anemia in chronic kidney disease 08/29/2011  . Gout 04/13/2011  . Stress incontinence, female 11/11/2010  . Anxiety 08/18/2010  . Chronic systolic heart failure ([place]) 02/18/2009  . SINUS BRADYCARDIA 10/29/2008  . Peripheral vascular disease ([place]) 06/18/2008  . Chronic kidney disease, stage IV (severe) ([place]) 07/13/2007  . Chronic lymphocytic leukemia ([place]) 10/13/2006  . 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 HYPERCHOLESTEROLEMIA 03/23/2006  . OBESITY, NOS 03/23/2006  . Essential hypertension 03/23/2006  . Coronary atherosclerosis 03/23/2006  . REFLUX ESOPHAGITIS 03/23/2006  . IRRITABLE BOWEL SYNDROME 03/23/2006  . CYSTOCELE/RECTOCELE/PROLAPSE,UNSPEC. 03/23/2006  . Osteoarthritis, multiple sites 03/23/2006   Disposition: Home with home health  Discharge Condition:  Stable  Discharge Exam:  Please refer to exam from progress note from day of discharge  Brief Hospital Course:  Pt presented to the hospital 48 hours after onset of vision changes and unsteady gait concerning for an acute stroke. Neurology, PT, OT, SLP, and CM all helped coordinate care during hospitalization. PT/OT recommended SNF upon discharge but patient decided to go home with home health instead.  Acute CVA: Pt presented with left lid droop, delayed L pupillary reaction to light. 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 Risk factors include CAD, HLD, and HTN. Echo was negative for intracardiac thrombus with an EF of 55-60% and carotid dopplers were negative for stenosis. MRA/MRI brain results noted below. Neurology consulted. Recommended continuing ASA 81mg  and Plavix 75mg . Continued statin. PT recommended SNF, however patient desired to be discharged home with home health services.   Vomiting: Had an episode of vomiting likely related to increased phlegm in the setting of chronic sinusitis. Was managed with home Flonase.   Other chronic conditions including T2DM, HTN, CKD Stage 4 with anemia, HLD, GERD, gout, and anxiety were managed appropriately during inpatient stay with no acute exacerbations or concerns.   Issues for Follow Up:  - ensure patient is doing well with home health services (PT recommended SNF however patient chose to be discharged home with home health services)   Significant Procedures: none  Significant Labs and Imaging:   Recent Labs Lab 09/03/15 1041  WBC 9.2  HGB 10.5*  HCT 34.0*  PLT 361    Recent Labs Lab 09/03/15 1041  NA 140  K 4.3  CL 102  CO2 27  GLUCOSE 129*  BUN 52*  CREATININE 2.27*  CALCIUM 9.3   Lipid Panel     Component Value Date/Time   CHOL 169 09/04/2015 0602   TRIG  188 (H) 09/04/2015 0602   HDL 32 (L) 09/04/2015 0602   CHOLHDL 5.3 09/04/2015 0602   VLDL 38 09/04/2015 0602   LDLCALC 99 09/04/2015 0602  Ha1c:  7.2 TSH: 3.397 CT Head: IMPRESSION: Changes consistent with prior ischemia.  No acute abnormality noted.  MR Brain wo contrast:  IMPRESSION: 1. 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 This is likely affecting a component of the pathway for control of left eye movement in this clinical setting. No associated hemorrhage or mass effect. 2. Questionable additional punctate lacunar infarct in the dorsal right pons, versus artifact. 3. Underlying severe chronic small vessel disease in the brainstem. Mild progression of chronic small vessel disease at in the cerebral white matter and left thalamus. MRA Head:  IMPRESSION: 1. Negative intracranial MRA. No large or proximal arterial branch occlusion. No high-grade or correctable stenosis. 2. Relatively mild for age atheromatous disease involving the anterior and posterior circulation as detailed above. Echo: EF 55-60%, mild LVH, G1DD Carotid Dopplers: negative for stenosis  Results/Tests Pending at Time of Discharge: none  Discharge Medications:    Medication List    STOP taking these medications   azithromycin 500 MG tablet Commonly known as:  ZITHROMAX     TAKE these medications   allopurinol 300 MG tablet Commonly known as:  ZYLOPRIM TAKE 1/2 TABLET BY MOUTH DAILY   amLODipine 10 MG tablet Commonly known as:  NORVASC Take 1 tablet (10 mg total) by mouth daily.   aspirin 81 MG tablet Take 81 mg by mouth daily.   atorvastatin 80 MG tablet Commonly known as:  LIPITOR Take 80 mg by mouth daily.   cloNIDine 0.3 MG tablet Commonly known as:  CATAPRES Take 1 tablet (0.3 mg total) by mouth 3 (three) times daily.   clopidogrel 75 MG tablet Commonly known as:  PLAVIX TAKE 1 TABLET BY MOUTH DAILY   fenofibrate 48 MG tablet Commonly known as:  TRICOR Take 1 tablet (48 mg total) by mouth daily.   ferrous sulfate 325 (65 FE) MG tablet Take 1 tablet (325 mg  total) by mouth daily with breakfast.   Fish Oil 1000 MG Caps Take 2,000 mg by mouth 2 (two) times daily.   fluticasone 50 MCG/ACT nasal spray Commonly known as:  FLONASE Place 2 sprays into both nostrils daily.   furosemide 40 MG tablet Commonly known as:  LASIX Take 1 tablet (40 mg total) by mouth 2 (two) times daily with breakfast and lunch.   GNP GARLIC EXTRACT PO Take 1 tablet by mouth daily.   HUMALOG KWIKPEN 100 UNIT/ML KiwkPen Generic drug:  insulin lispro INJECT 5 UNITS INTO THE SKIN THREE TIMES DAILY BEFORE MEALS   LANTUS SOLOSTAR 100 UNIT/ML Solostar Pen Generic drug:  Insulin Glargine INJECT 35 UNITS UNDER THE SKIN DAILY What changed:  See the new instructions.   lisinopril 40 MG tablet Commonly known as:  PRINIVIL,ZESTRIL TAKE 1 TABLET BY MOUTH EVERY DAY   Melatonin 10 MG Caps Take 10 mg by mouth at bedtime as needed (sleep).   metoprolol tartrate 25 MG tablet Commonly known as:  LOPRESSOR TAKE 1/2 TABLET BY MOUTH TWICE DAILY   multivitamins ther. w/minerals Tabs tablet Take 1 tablet by mouth daily.   OVER THE COUNTER MEDICATION Take 1 tablet by mouth 2 (two) times daily. Macular Degenerative Eye Supplement   pantoprazole 40 MG tablet Commonly known as:  PROTONIX TAKE 1 TABLET BY MOUTH DAILY   PARoxetine  20 MG tablet Commonly known as:  PAXIL TAKE 1 TABLET BY MOUTH EVERY MORNING   traMADol 50 MG tablet Commonly known as:  ULTRAM TAKE 1 TABLET BY MOUTH FOUR TIMES DAILY   vitamin C 100 MG tablet Take 100 mg by mouth daily.       Discharge Instructions: Please refer to Patient Instructions section of EMR for full details.  Patient was counseled important signs and symptoms that should prompt return to medical care, changes in medications, dietary instructions, activity restrictions, and follow up appointments.   Follow-Up Appointments: Follow-up Information    SETHI,PRAMOD, MD Follow up in 2 month(s).   Specialties:  Neurology,  Radiology Why:  Stroke clinic. Office will call you with appointment date and time. Contact information: [address] [place] [address] [phone]        Zigmund Gottron, MD Follow up on 09/10/2015.   Specialty:  Family Medicine Why:  hospital follow-up appointment at 10:00AM Contact information: [place] [address] [place] .   Why:  home health physical and occupational therapy and rolling walker Contact information: [address] [place] [address] [phone]           Paul G Abraham, Medical Student 09/07/2015, 8:27 AM [place]  I have read the above note and made revisions.   Smiley Houseman, MD PGY-2 [facility] Pager [phone]

## 2015-09-08 ENCOUNTER — Other Ambulatory Visit: Payer: Self-pay | Admitting: Hematology and Oncology

## 2015-09-08 ENCOUNTER — Encounter: Payer: Self-pay | Admitting: *Deleted

## 2015-09-08 ENCOUNTER — Encounter: Payer: Self-pay | Admitting: Hematology and Oncology

## 2015-09-08 ENCOUNTER — Ambulatory Visit (HOSPITAL_BASED_OUTPATIENT_CLINIC_OR_DEPARTMENT_OTHER): Payer: Medicare Other | Admitting: Hematology and Oncology

## 2015-09-08 ENCOUNTER — Other Ambulatory Visit (HOSPITAL_BASED_OUTPATIENT_CLINIC_OR_DEPARTMENT_OTHER): Payer: Medicare Other

## 2015-09-08 DIAGNOSIS — D631 Anemia in chronic kidney disease: Secondary | ICD-10-CM | POA: Diagnosis not present

## 2015-09-08 DIAGNOSIS — I639 Cerebral infarction, unspecified: Secondary | ICD-10-CM

## 2015-09-08 DIAGNOSIS — N184 Chronic kidney disease, stage 4 (severe): Secondary | ICD-10-CM | POA: Diagnosis not present

## 2015-09-08 DIAGNOSIS — C9110 Chronic lymphocytic leukemia of B-cell type not having achieved remission: Secondary | ICD-10-CM

## 2015-09-08 DIAGNOSIS — I6381 Other cerebral infarction due to occlusion or stenosis of small artery: Secondary | ICD-10-CM

## 2015-09-08 DIAGNOSIS — N189 Chronic kidney disease, unspecified: Secondary | ICD-10-CM

## 2015-09-08 LAB — CBC & DIFF AND RETIC
BASO%: 0.3 % (ref 0.0–2.0)
Basophils Absolute: 0 10*3/uL (ref 0.0–0.1)
EOS%: 5.1 % (ref 0.0–7.0)
Eosinophils Absolute: 0.5 10*3/uL (ref 0.0–0.5)
HCT: 30.3 % — ABNORMAL LOW (ref 34.8–46.6)
HGB: 9.8 g/dL — ABNORMAL LOW (ref 11.6–15.9)
IMMATURE RETIC FRACT: 12.5 % — AB (ref 1.60–10.00)
LYMPH%: 18.6 % (ref 14.0–49.7)
MCH: 30.8 pg (ref 25.1–34.0)
MCHC: 32.3 g/dL (ref 31.5–36.0)
MCV: 95.3 fL (ref 79.5–101.0)
MONO#: 0.9 10*3/uL (ref 0.1–0.9)
MONO%: 9 % (ref 0.0–14.0)
NEUT%: 67 % (ref 38.4–76.8)
NEUTROS ABS: 6.3 10*3/uL (ref 1.5–6.5)
PLATELETS: 284 10*3/uL (ref 145–400)
RBC: 3.18 10*6/uL — AB (ref 3.70–5.45)
RDW: 15.5 % — ABNORMAL HIGH (ref 11.2–14.5)
Retic %: 1.52 % (ref 0.70–2.10)
Retic Ct Abs: 48.34 10*3/uL (ref 33.70–90.70)
WBC: 9.4 10*3/uL (ref 3.9–10.3)
lymph#: 1.8 10*3/uL (ref 0.9–3.3)

## 2015-09-08 LAB — COMPREHENSIVE METABOLIC PANEL
ALT: 19 U/L (ref 0–55)
ANION GAP: 10 meq/L (ref 3–11)
AST: 27 U/L (ref 5–34)
Albumin: 2.7 g/dL — ABNORMAL LOW (ref 3.5–5.0)
Alkaline Phosphatase: 98 U/L (ref 40–150)
BUN: 87.2 mg/dL — ABNORMAL HIGH (ref 7.0–26.0)
CHLORIDE: 103 meq/L (ref 98–109)
CO2: 25 meq/L (ref 22–29)
Calcium: 8.8 mg/dL (ref 8.4–10.4)
Creatinine: 5.5 mg/dL (ref 0.6–1.1)
EGFR: 7 mL/min/{1.73_m2} — AB (ref >90)
GLUCOSE: 226 mg/dL — AB (ref 70–140)
POTASSIUM: 5.1 meq/L (ref 3.5–5.1)
SODIUM: 139 meq/L (ref 136–145)
TOTAL PROTEIN: 5.7 g/dL — AB (ref 6.4–8.3)

## 2015-09-08 NOTE — Assessment & Plan Note (Signed)
This is likely anemia of chronic disease. The patient denies recent history of bleeding such as epistaxis, hematuria or hematochezia. She is asymptomatic from the anemia. We will observe for now.  

## 2015-09-08 NOTE — Assessment & Plan Note (Signed)
She had recent diagnosis of lacunar infarct. She will continue medication management and strict risk factor modifications

## 2015-09-08 NOTE — Assessment & Plan Note (Signed)
She has no signs of disease recurrence I will see her back in 12 months with history, physical examination and blood work.    

## 2015-09-08 NOTE — Assessment & Plan Note (Signed)
She has evidence of acute on chronic renal failure today. I'm concerned about significant elevated creatinine. I gave her copy of the test results and we will call her nephrologist office for further and judgment

## 2015-09-08 NOTE — Progress Notes (Signed)
[facility] OFFICE PROGRESS NOTE  Patient Care Team: [name], MD as PCP - General Monna Fam, MD (Ophthalmology) [name], MD as Consulting Physician (Cardiology) Hurman Horn, MD (Ophthalmology) Heath Lark, MD as Consulting Physician (Hematology and Oncology) Estanislado Emms, MD as Consulting Physician (Nephrology)  SUMMARY OF ONCOLOGIC HISTORY:   Chronic lymphocytic leukemia ([place])   10/13/2006 Initial Diagnosis    Chronic lymphocytic leukemia     12/24/2013 Procedure    Kidney biopsy showed renal involvement of CLL, with associated diabetic gromerulosclerosis, moderate to severe arterionephrosclerosis     01/10/2014 Imaging    PET/CT scan showed no evidence of metastatic disease     01/10/2014 Procedure    She has placement of port     01/14/2014 Pathology Results    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  she returns for further follow-up. She was recently hospitalized for acute stroke affecting her left eye. The patient continues to have double vision. She complained of weakness but no recent falls. No other neurological deficits. She denies new lymphadenopathy. No recent infection.   REVIEW OF SYSTEMS:   Constitutional: Denies fevers, chills or abnormal weight loss Eyes: Denies blurriness of vision Ears, nose, mouth, throat, and face: Denies mucositis or sore throat Respiratory: Denies cough, dyspnea or wheezes Cardiovascular: Denies palpitation, chest discomfort or lower extremity swelling Gastrointestinal:  Denies nausea, heartburn or change in bowel habits Skin: Denies abnormal skin rashes Lymphatics: Denies new lymphadenopathy or easy bruising Behavioral/Psych: Mood is stable, no new  changes  All other systems were reviewed with the patient and are negative.  I have reviewed the past medical history, past surgical history, social history and family history with the patient and they are unchanged from previous note.  ALLERGIES:  is allergic to amoxicillin; cephalexin; and codeine phosphate.  MEDICATIONS:  Current Outpatient Prescriptions  Medication Sig Dispense Refill  . allopurinol (ZYLOPRIM) 300 MG tablet TAKE 1/2 TABLET BY MOUTH DAILY 45 tablet 3  . amLODipine (NORVASC) 10 MG tablet Take 1 tablet (10 mg total) by mouth daily. 90 tablet 3  . Ascorbic Acid (VITAMIN C) 100 MG tablet Take 100 mg by mouth daily.    Marland Kitchen aspirin 81 MG tablet Take 81 mg by mouth daily.    Marland Kitchen atorvastatin (LIPITOR) 80 MG tablet Take 80 mg by mouth daily.    . cloNIDine (CATAPRES) 0.3 MG tablet Take 1 tablet (0.3 mg total) by mouth 3 (three) times daily. 270 tablet 3  . clopidogrel (PLAVIX) 75 MG tablet TAKE 1 TABLET BY MOUTH DAILY 90 tablet 3  . fenofibrate (TRICOR) 48 MG tablet Take 1 tablet (48 mg total) by mouth daily. 30 tablet 4  . ferrous sulfate 325 (65 FE) MG tablet Take 1 tablet (325 mg total) by mouth daily with breakfast.  3  . fluticasone (FLONASE) 50 MCG/ACT nasal spray Place 2 sprays into both nostrils daily. 16 g 6  . furosemide (LASIX) 40 MG tablet Take 1 tablet (40 mg total) by mouth 2 (two) times daily with breakfast and lunch. 180 tablet 3  . GNP GARLIC EXTRACT PO Take 1 tablet by mouth daily.     Marland Kitchen HUMALOG KWIKPEN 100 UNIT/ML KiwkPen  INJECT 5 UNITS INTO THE SKIN THREE TIMES DAILY BEFORE MEALS 15 mL 12  . LANTUS SOLOSTAR 100 UNIT/ML Solostar Pen INJECT 35 UNITS UNDER THE SKIN DAILY (Patient taking differently: INJECT 40 UNITS UNDER THE SKIN DAILY) 15 mL 12  . lisinopril (PRINIVIL,ZESTRIL) 40 MG tablet TAKE 1 TABLET BY MOUTH EVERY DAY 90 tablet 3  . Melatonin 10 MG CAPS Take 10 mg by mouth at bedtime as needed (sleep).     . metoprolol tartrate (LOPRESSOR) 25 MG tablet TAKE 1/2  TABLET BY MOUTH TWICE DAILY 90 tablet 3  . Multiple Vitamins-Minerals (MULTIVITAMINS THER. W/MINERALS) TABS Take 1 tablet by mouth daily.      . Omega-3 Fatty Acids (FISH OIL) 1000 MG CAPS Take 2,000 mg by mouth 2 (two) times daily.     Marland Kitchen OVER THE COUNTER MEDICATION Take 1 tablet by mouth 2 (two) times daily. Macular Degenerative Eye Supplement    . pantoprazole (PROTONIX) 40 MG tablet TAKE 1 TABLET BY MOUTH DAILY 90 tablet 3  . PARoxetine (PAXIL) 20 MG tablet TAKE 1 TABLET BY MOUTH EVERY MORNING 90 tablet 3  . traMADol (ULTRAM) 50 MG tablet TAKE 1 TABLET BY MOUTH FOUR TIMES DAILY 360 tablet 1   No current facility-administered medications for this visit.     PHYSICAL EXAMINATION: ECOG PERFORMANCE STATUS: 1 - Symptomatic but completely ambulatory  Vitals:   09/08/15 1249  BP: (!) 132/42  Pulse: (!) 55  Resp: 18  Temp: 97.6 F (36.4 C)   Filed Weights   09/08/15 1249  Weight: 184 lb 9.6 oz (83.7 kg)    GENERAL:alert, no distress and comfortable SKIN: skin color is pale, texture, turgor are normal, no rashes or significant lesions EYES: normal, Conjunctiva are Pale and non-injected, sclera clear OROPHARYNX:no exudate, no erythema and lips, buccal mucosa, and tongue normal  NECK: supple, thyroid normal size, non-tender, without nodularity LYMPH:  no palpable lymphadenopathy in the cervical, axillary or inguinal LUNGS: clear to auscultation and percussion with normal breathing effort HEART: regular rate & rhythm and no murmurs and no lower extremity edema ABDOMEN:abdomen soft, non-tender and normal bowel sounds Musculoskeletal:no cyanosis of digits and no clubbing  NEURO: alert & oriented x 3 with fluent speech, With mild drooping on her left eyelid  LABORATORY DATA:  I have reviewed the data as listed    Component Value Date/Time   NA 139 09/08/2015 1230   K 5.1 09/08/2015 1230   CL 102 09/03/2015 1041   CL 110 (H) 05/28/2012 1259   CO2 25 09/08/2015 1230   GLUCOSE 226  (H) 09/08/2015 1230   GLUCOSE 202 (H) 05/28/2012 1259   BUN 87.2 (H) 09/08/2015 1230   CREATININE 5.5 (HH) 09/08/2015 1230   CALCIUM 8.8 09/08/2015 1230   PROT 5.7 (L) 09/08/2015 1230   ALBUMIN 2.7 (L) 09/08/2015 1230   AST 27 09/08/2015 1230   ALT 19 09/08/2015 1230   ALKPHOS 98 09/08/2015 1230   BILITOT <0.30 09/08/2015 1230   GFRNONAA 21 (L) 09/03/2015 1041   GFRNONAA 17 (L) 07/02/2015 1111   GFRAA 24 (L) 09/03/2015 1041   GFRAA 20 (L) 07/02/2015 1111    No results found for: SPEP, UPEP  Lab Results  Component Value Date   WBC 9.4 09/08/2015   NEUTROABS 6.3 09/08/2015   HGB 9.8 (L) 09/08/2015   HCT 30.3 (L) 09/08/2015   MCV 95.3 09/08/2015   PLT 284 09/08/2015      Chemistry      Component Value Date/Time  NA 139 09/08/2015 1230   K 5.1 09/08/2015 1230   CL 102 09/03/2015 1041   CL 110 (H) 05/28/2012 1259   CO2 25 09/08/2015 1230   BUN 87.2 (H) 09/08/2015 1230   CREATININE 5.5 (HH) 09/08/2015 1230      Component Value Date/Time   CALCIUM 8.8 09/08/2015 1230   ALKPHOS 98 09/08/2015 1230   AST 27 09/08/2015 1230   ALT 19 09/08/2015 1230   BILITOT <0.30 09/08/2015 1230     I reviewed recent imaging study. Echocardiogram showed no evidence of patent foramen ovale Carotid ultrasound showed no significant stenosis  ASSESSMENT & PLAN:  Chronic lymphocytic leukemia She has no signs of disease recurrence I will see her back in 12 months with history, physical examination and blood work.     Chronic kidney disease, stage IV (severe) (HCC) She has evidence of acute on chronic renal failure today. I'm concerned about significant elevated creatinine. I gave her copy of the test results and we will call her nephrologist office for further and judgment  Anemia in chronic kidney disease This is likely anemia of chronic disease. The patient denies recent history of bleeding such as epistaxis, hematuria or hematochezia. She is asymptomatic from the anemia. We will  observe for now.   Acute lacunar infarction [facility]) She had recent diagnosis of lacunar infarct. She will continue medication management and strict risk factor modifications   No orders of the defined types were placed in this encounter.  All questions were answered. The patient knows to call the clinic with any problems, questions or concerns. No barriers to learning was detected. I spent 20 minutes counseling the patient face to face. The total time spent in the appointment was 25 minutes and more than 50% was on counseling and review of test results     [facility], [place], MD 09/08/2015 4:39 PM

## 2015-09-08 NOTE — Progress Notes (Signed)
Subject met inclusion and exclusion criteria.  The informed consent form, study requirements and expectations were reviewed with the subject and questions and concerns were addressed prior to the signing of the consent form.  The subject verbalized understanding of the trail requirements.  The subject agreed to participate in the STROKE-AF trial and signed the informed consent.  The informed consent was obtained prior to performance of any protocol-specific procedures for the subject.  A copy of the signed informed consent was given to the subject and a copy was placed in the subject's medical record. Subject was randomized to [facility] recorder arm of study and this will be implanted by Dr. Rayann Heman on Friday 09/11/15, patient will arrive @ 6:30 am. Questions encouraged and answered.

## 2015-09-09 ENCOUNTER — Telehealth: Payer: Self-pay | Admitting: *Deleted

## 2015-09-10 ENCOUNTER — Encounter: Payer: Self-pay | Admitting: Family Medicine

## 2015-09-10 ENCOUNTER — Ambulatory Visit (INDEPENDENT_AMBULATORY_CARE_PROVIDER_SITE_OTHER): Payer: Medicare Other | Admitting: Family Medicine

## 2015-09-10 ENCOUNTER — Other Ambulatory Visit: Payer: Self-pay

## 2015-09-10 DIAGNOSIS — I639 Cerebral infarction, unspecified: Secondary | ICD-10-CM

## 2015-09-10 DIAGNOSIS — M1000 Idiopathic gout, unspecified site: Secondary | ICD-10-CM

## 2015-09-10 DIAGNOSIS — I10 Essential (primary) hypertension: Secondary | ICD-10-CM

## 2015-09-10 DIAGNOSIS — N179 Acute kidney failure, unspecified: Secondary | ICD-10-CM | POA: Diagnosis not present

## 2015-09-10 DIAGNOSIS — N189 Chronic kidney disease, unspecified: Secondary | ICD-10-CM

## 2015-09-10 DIAGNOSIS — H532 Diplopia: Secondary | ICD-10-CM | POA: Diagnosis not present

## 2015-09-10 DIAGNOSIS — I6381 Other cerebral infarction due to occlusion or stenosis of small artery: Secondary | ICD-10-CM

## 2015-09-10 MED ORDER — ALLOPURINOL 100 MG PO TABS: 100.0000 mg | ORAL_TABLET | Freq: Every day | ORAL | 3 refills | Status: DC

## 2015-09-10 NOTE — Assessment & Plan Note (Signed)
Still present.  Secondary to CVA

## 2015-09-10 NOTE — Assessment & Plan Note (Signed)
BP fine off both lasix and ACE.  Follow.

## 2015-09-10 NOTE — Patient Outreach (Signed)
[place] [facility]) Care Management  09/10/2015  MYKERIA GUARDIOLA [birth year] [id number]     EMMI-Stroke RED ON EMMI ALERT Day # 1 Date: 09/09/15 Red Alert Reason: "Feeling worse overall? Yes   Problems setting up rehab? Yes"    Outreach attempt #1 to patient. No answer at present. RN CM left HIPAA compliant voicemail message along with contact info.     Plan: RN CM will make outreach attempt to patient within one business day.   Enzo Montgomery, RN,BSN,CCM [place] Management Telephonic Care Management Coordinator Direct Phone: [phone] Toll Free: [phone] Fax: [phone]

## 2015-09-10 NOTE — Progress Notes (Signed)
   Subjective:    Patient ID: Christine Santos, female    DOB: 1945-02-05, 70 y.o.   MRN: [id number]  HPI FU hospitalization for CVA.  Caused balance problems and double vision.  Balance problems improving.  Has not yet had home PT.  Feels safe with walker.  Vision problems not improving.  Meds were not changed.  By coincidence, had appointment with onc who did blood work which showed creat bump from baseline of 2.5 to 5.5.  Seen yesterday by Renal, Dr. [name].  Held ACE and lasix.  Etiology?  States she was NPO for a prolonged time in the hospital.  Already has plan for repeat creat next week ordered by dr. [name].      Review of Systems     Objective:   Physical Exam VS noted.  Systolic 123XX123 but diastolic only 53.  Will monitor off ACE and furosemide.  Gait, very steady with walker.          Assessment & Plan:

## 2015-09-10 NOTE — Patient Instructions (Signed)
I sent in a new prescription for the allopurinol lower dose. See me in one month for a flu shot and recheck. See Dr. Herbert Deaner for an eye check when you think you are safe to drive.  Have her send me the report.   I will be very interested in how your creatinine is one week from now.

## 2015-09-10 NOTE — Assessment & Plan Note (Signed)
Etiology?  Combination of dehydration (furosemide) and ACE.  Agree with hold.  Monitor.

## 2015-09-10 NOTE — Assessment & Plan Note (Signed)
Decrease allopurinol 

## 2015-09-10 NOTE — Assessment & Plan Note (Addendum)
09/03/15  Balance improving.  Needs PT.  Will need ophth. FU.

## 2015-09-11 ENCOUNTER — Other Ambulatory Visit: Payer: Self-pay

## 2015-09-11 ENCOUNTER — Encounter (HOSPITAL_COMMUNITY): Payer: Self-pay | Admitting: Internal Medicine

## 2015-09-11 ENCOUNTER — Encounter (HOSPITAL_COMMUNITY)
Admission: RE | Disposition: A | Discharge status: Home / Self Care | Payer: Self-pay | Source: Ambulatory Visit | Attending: Internal Medicine

## 2015-09-11 ENCOUNTER — Telehealth: Payer: Self-pay | Admitting: Hematology and Oncology

## 2015-09-11 ENCOUNTER — Ambulatory Visit (HOSPITAL_COMMUNITY)
Admission: RE | Admit: 2015-09-11 | Discharge: 2015-09-11 | Disposition: A | Discharge status: Home / Self Care | Payer: Medicare Other | Source: Ambulatory Visit | Attending: Internal Medicine | Admitting: Internal Medicine

## 2015-09-11 DIAGNOSIS — K219 Gastro-esophageal reflux disease without esophagitis: Secondary | ICD-10-CM | POA: Insufficient documentation

## 2015-09-11 DIAGNOSIS — I2510 Atherosclerotic heart disease of native coronary artery without angina pectoris: Secondary | ICD-10-CM | POA: Insufficient documentation

## 2015-09-11 DIAGNOSIS — I639 Cerebral infarction, unspecified: Secondary | ICD-10-CM | POA: Insufficient documentation

## 2015-09-11 DIAGNOSIS — I110 Hypertensive heart disease with heart failure: Secondary | ICD-10-CM | POA: Insufficient documentation

## 2015-09-11 DIAGNOSIS — E785 Hyperlipidemia, unspecified: Secondary | ICD-10-CM | POA: Insufficient documentation

## 2015-09-11 DIAGNOSIS — Z880 Allergy status to penicillin: Secondary | ICD-10-CM | POA: Insufficient documentation

## 2015-09-11 DIAGNOSIS — M1990 Unspecified osteoarthritis, unspecified site: Secondary | ICD-10-CM | POA: Insufficient documentation

## 2015-09-11 DIAGNOSIS — I252 Old myocardial infarction: Secondary | ICD-10-CM | POA: Insufficient documentation

## 2015-09-11 DIAGNOSIS — Z794 Long term (current) use of insulin: Secondary | ICD-10-CM | POA: Insufficient documentation

## 2015-09-11 DIAGNOSIS — E1151 Type 2 diabetes mellitus with diabetic peripheral angiopathy without gangrene: Secondary | ICD-10-CM | POA: Insufficient documentation

## 2015-09-11 DIAGNOSIS — Z951 Presence of aortocoronary bypass graft: Secondary | ICD-10-CM | POA: Insufficient documentation

## 2015-09-11 DIAGNOSIS — Z8249 Family history of ischemic heart disease and other diseases of the circulatory system: Secondary | ICD-10-CM | POA: Insufficient documentation

## 2015-09-11 DIAGNOSIS — I5032 Chronic diastolic (congestive) heart failure: Secondary | ICD-10-CM | POA: Insufficient documentation

## 2015-09-11 HISTORY — PX: EP IMPLANTABLE DEVICE: SHX172B

## 2015-09-11 HISTORY — DX: Cerebral infarction, unspecified: I63.9

## 2015-09-11 LAB — GLUCOSE, CAPILLARY: GLUCOSE-CAPILLARY: 186 mg/dL — AB (ref 65–99)

## 2015-09-11 SURGERY — LOOP RECORDER INSERTION
Anesthesia: LOCAL

## 2015-09-11 MED ORDER — LIDOCAINE HCL (PF) 1 % IJ SOLN: INTRAMUSCULAR | Status: DC | PRN

## 2015-09-11 MED ORDER — LIDOCAINE-EPINEPHRINE 1 %-1:100000 IJ SOLN
INTRAMUSCULAR | Status: DC | PRN
  Administered 2015-09-11: 7 mL

## 2015-09-11 SURGICAL SUPPLY — 2 items
LOOP REVEAL LINQSYS (Prosthesis & Implant Heart) ×2 IMPLANT
PACK LOOP INSERTION (CUSTOM PROCEDURE TRAY) ×2 IMPLANT

## 2015-09-11 NOTE — Patient Outreach (Signed)
[facility]) Care Management  09/11/2015  Christine Santos 25-Jul-1945 [id number]      EMMI-Stroke RED ON EMMI ALERT Day # 1 Date: 09/09/15 Red Alert Reason: "Feeling worse overall? Yes Problems setting up rehab? Yes"    Incoming call from patient returning RN CM call. Discussed and reviewed with patient red alert. Patient states she is still having some issues with double vision that's why she answered the question that way. She had PCP f/u appt on yesterday and did advise MD of this. Patient states she was supposed to get [facility] PT/OT but has not heard anything from [facility] agency. Advised patient that RN CM would f/u with [facility] agency. Patient denies any issues with managing or affording meds. She has transportation to get to appts. She has not made two month f/u appt with Dr. Leonie Man yet but plans to do. Advised patient to do so in the very near future. She voiced understanding.    Plan: RN CM will contact [place] to f/u on [facility] services. RN CM will make outreach call to patient once f/u and status of [place] services verified.     Enzo Montgomery, RN,BSN,CCM [place] Management Telephonic Care Management Coordinator Direct Phone: [phone] Toll Free: [phone] Fax: [phone]

## 2015-09-11 NOTE — Patient Outreach (Signed)
[facility]) Care Management  09/11/2015  Christine Santos 02-17-1945 [id number]     EMMI-Stroke  Care Coordination    Return call placed to patient. Patient made aware of RN CM phone call with [facility] Provided patient with [facility] contact info. Patient instructed to contact [facility] agency if no response from them within 1-2 business days. She voiced understanding.   Plan: RN CM will f/u with patient within a week to confirm [facility] services in place.   Enzo Montgomery, RN,BSN,CCM [place] Management Telephonic Care Management Coordinator Direct Phone: [phone] Toll Free: [phone] Fax: [phone]

## 2015-09-11 NOTE — Patient Outreach (Addendum)
[place] [facility]) Care Management  09/11/2015  Christine Santos 09-Aug-1945 [id number]     EMMI-Stroke RED ON EMMI ALERT Day # 1 Date: 09/09/15 Red Alert Reason: "Feeling worse overall? Yes   Problems setting up rehab? Yes"    Outreach attempt #2 to patient. No answer. RN CM left HIPAA compliant voicemail message along with contact info.   Plan: RN CM will make outreach attempt to patient within one business day if no return call from patient. RN CM will send unsuccessful outreach letter to patient. RN CM will close case is no response from patient within 10 business days.   Enzo Montgomery, RN,BSN,CCM [place] Management Telephonic Care Management Coordinator Direct Phone: [phone] Toll Free: [phone] Fax: [phone]

## 2015-09-11 NOTE — Discharge Instructions (Signed)
No driving for 1 week. No lifting over 5 lbs for 1 week. No vigorous or sexual activity for 1 week. You may return to work on 09/18/15. Keep procedure site clean & dry. If you notice increased pain, swelling, bleeding or pus, call/return!  You may shower, but no soaking baths/hot tubs/pools for 1 week.

## 2015-09-11 NOTE — Patient Outreach (Signed)
[facility]) Care Management  09/11/2015  LINDSEE HOLLOPETER 04-13-1945 [id number]     EMMI-Stroke Care Coordination   Outreach call to [facility] agency. Spoke with david. Discussed that patient was discharged from [facility] on 09/05/15 with [facility] orders per discharge summary. Advised staff that RN CM had spoken with patient today and confirmed that no one from [facility] had contacted patient. Staff confirmed patient has active order for PT/OT services and appropriate personnel should be contacting patient later this afternoon or next business day.   Plan: RN CM will contact patient to provide update on HH status.  Enzo Montgomery, RN,BSN,CCM [place] [facility] Telephonic Care Management Coordinator Direct Phone: [phone] Toll Free: [phone] Fax: [phone]

## 2015-09-11 NOTE — Telephone Encounter (Signed)
Called patient to confirm appointment. L/M Appt letter and schd mailed.

## 2015-09-11 NOTE — H&P (Signed)
ELECTROPHYSIOLOGY CONSULT NOTE    Primary Care Physician: Zigmund Gottron, MD Referring Physician:  Dr Leonie Man  Admit Date: 09/11/2015  Reason for consultation:  stroke  Christine Santos is a 70 y.o. female with a h/o recent stroke.  She presented with left lid droop, delayed L pupillary reaction to light. 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 Risk factors include CAD, HLD, and HTN. Echo was negative for intracardiac thrombus with an EF of 55-60% and carotid dopplers were negative for stenosis.  Neurology consulted. Recommended continuing ASA 81mg  and Plavix 75mg .  She is referred for Stroke AF study.  Today, she denies symptoms of palpitations, chest pain, shortness of breath, orthopnea, PND, lower extremity edema, dizziness, presyncope, syncope, or new neurologic sequela. The patient is tolerating medications without difficulties and is otherwise without complaint today.   Past Medical History:  Diagnosis Date  . Anemia 08/29/2011  . Arthritis    "legs" (01/22/2013)  . CAD (coronary artery disease)    s/p CABG in 1999  . CLL (chronic lymphoblastic leukemia)   . Diastolic heart failure ([place])   . GERD (gastroesophageal reflux disease)   . H/O hiatal hernia   . Heart murmur   . Hyperlipidemia   . Hypertension   . IDDM (insulin dependent diabetes mellitus) ([place])   . Myocardial infarction [facility])    "I had one but didn't know it" (01/22/2013)  . PAD (peripheral artery disease) ([place])   . Peripheral vascular disease ([place])   . Stroke (cerebrum) [facility])    Past Surgical History:  Procedure Laterality Date  . ANGIOPLASTY / STENTING FEMORAL Right 09/2010   SFA/notes 11/25/2010 (01/22/2013)  . ANGIOPLASTY / STENTING FEMORAL Left 11/2010   SFA/notes 11/25/2010 (01/22/2013)  . ANGIOPLASTY / STENTING ILIAC     [name] 11/25/2010 (01/22/2013)  . CARDIAC CATHETERIZATION     "I've had 2-4" (01/22/2013)  . CATARACT EXTRACTION W/ INTRAOCULAR LENS  IMPLANT, BILATERAL Bilateral ?2011  .  CHOLECYSTECTOMY  1993  . CORONARY ARTERY BYPASS GRAFT  10/1994   "CABG X3"  . HEEL SPUR EXCISION Bilateral 1970's  . LOWER EXTREMITY ANGIOGRAM N/A 01/22/2013   Procedure: LOWER EXTREMITY ANGIOGRAM;  Surgeon: [name], MD;  Location: [facility] CATH LAB;  Service: Cardiovascular;  Laterality: N/A;  . SHOULDER OPEN ROTATOR CUFF REPAIR Bilateral 1990's   "2 times on 1 side; once on the other"  . TRANSLUMINAL ATHERECTOMY FEMORAL ARTERY Right 01/22/2013   & balloon  . TUBAL LIGATION  1980's       Allergies  Allergen Reactions  . Amoxicillin Rash    Has patient had a PCN reaction causing immediate rash, facial/tongue/throat swelling, SOB or lightheadedness with hypotension: No Has patient had a PCN reaction causing severe rash involving mucus membranes or skin necrosis: No Has patient had a PCN reaction that required hospitalization: No Has patient had a PCN reaction occurring within the last 10 years: No If all of the above answers are "NO", then may proceed with Cephalosporin use.   . Cephalexin Rash  . Codeine Phosphate Rash    Social History   Social History  . Marital status: Divorced    Spouse name: N/A  . Number of children: 2  . Years of education: 12   Occupational History  . DISABILITY Other    Worked at [employer] Topics  . Smoking status: Never Smoker  . Smokeless tobacco: Never Used  . Alcohol use No  . Drug use: No  .  Sexual activity: Not Currently   Other Topics Concern  . Not on file   Social History Narrative   Health Care POA:    Emergency Contact: sister, [name] (h) [phone]   End of Life Plan:    Who lives with you: female friend   Any pets: none   Diet: Pt has a varied diet of protein, starch, vegetables   Exercise: Pt does not have any regular exercise routine.   Seatbelts: Pt reports wearing seatbelt when in vehicles.    Sun Exposure/Protection: Pt reports using sun screen.   Hobbies: going to lake, biking,  blue grass music          Family History  Problem Relation Age of Onset  . Heart disease Mother   . Heart attack Mother   . Pneumonia Father     ROS- All systems are reviewed and negative except as per the HPI above  Physical Exam: Telemetry: Vitals:   09/11/15 0647  BP: (!) 147/57  Pulse: (!) 56  Resp: 16  Temp: 98 F (36.7 C)  TempSrc: Oral  SpO2: 93%  Weight: 184 lb (83.5 kg)  Height: 5' 5.5" (1.664 m)    GEN- The patient is elderly appearing, alert and oriented x 3 today.   Head- normocephalic, atraumatic Eyes-  Sclera clear, conjunctiva pink, L eye droop, double vision Ears- hearing intact Oropharynx- clear Neck- supple,   Lungs- Clear to ausculation bilaterally, normal work of breathing Heart- Regular rate and rhythm  GI- soft, NT, ND, + BS Extremities- no clubbing, cyanosis, or edema MS- no significant deformity or atrophy Skin- no rash or lesion Psych- euthymic mood, full affect  EKG reveals sinus bradycardia  Labs:   Lab Results  Component Value Date   WBC 9.4 09/08/2015   HGB 9.8 (L) 09/08/2015   HCT 30.3 (L) 09/08/2015   MCV 95.3 09/08/2015   PLT 284 09/08/2015    Recent Labs Lab 09/08/15 1230  NA 139  K 5.1  CO2 25  BUN 87.2*  CREATININE 5.5*  CALCIUM 8.8  PROT 5.7*  BILITOT <0.30  ALKPHOS 98  ALT 19  AST 27  GLUCOSE 226*   Lab Results  Component Value Date   CKTOTAL 47 01/07/2009   CKMB 2.7 01/07/2009   TROPONINI (H) 01/07/2009    0.39        PERSISTENTLY INCREASED TROPONIN VALUES IN THE RANGE OF 0.06-0.49 ng/mL CAN BE SEEN IN:       -UNSTABLE ANGINA       -CONGESTIVE HEART FAILURE       -MYOCARDITIS       -CHEST TRAUMA       -ARRYHTHMIAS       -LATE PRESENTING MI       -COPD   CLINICAL FOLLOW-UP RECOMMENDED.    Lab Results  Component Value Date   CHOL 169 09/04/2015   CHOL 150 12/29/2014   CHOL 154 01/08/2014   Lab Results  Component Value Date   HDL 32 (L) 09/04/2015   HDL 37 (L) 12/29/2014   HDL 38.50  (L) 01/08/2014   Lab Results  Component Value Date   LDLCALC 99 09/04/2015   LDLCALC 83 12/29/2014   LDLCALC 90 01/08/2014   Lab Results  Component Value Date   TRIG 188 (H) 09/04/2015   TRIG 152 (H) 12/29/2014   TRIG 126.0 01/08/2014   Lab Results  Component Value Date   CHOLHDL 5.3 09/04/2015   CHOLHDL 4.1 12/29/2014   CHOLHDL 4  01/08/2014   No results found for: LDLDIRECT    Echo:  Normal EF  ASSESSMENT AND PLAN:   Stroke  The patient is s/p recent stroke.  She has been monitoring during her hospitalization without afib detected.  She presents for consideration of implantable loop recorder according to Stroke AF protocol.  Risks, benefits, and alternatives to ILR implant were discussed with the patient who wishes to proceed at this time.  We will monitoring her device remotely going forward.  Thompson Grayer MD, [facility] 09/11/2015 7:32 AM

## 2015-09-12 DIAGNOSIS — C9111 Chronic lymphocytic leukemia of B-cell type in remission: Secondary | ICD-10-CM | POA: Diagnosis not present

## 2015-09-12 DIAGNOSIS — R2681 Unsteadiness on feet: Secondary | ICD-10-CM | POA: Diagnosis not present

## 2015-09-12 DIAGNOSIS — I130 Hypertensive heart and chronic kidney disease with heart failure and stage 1 through stage 4 chronic kidney disease, or unspecified chronic kidney disease: Secondary | ICD-10-CM | POA: Diagnosis not present

## 2015-09-12 DIAGNOSIS — D631 Anemia in chronic kidney disease: Secondary | ICD-10-CM | POA: Diagnosis not present

## 2015-09-12 DIAGNOSIS — I2510 Atherosclerotic heart disease of native coronary artery without angina pectoris: Secondary | ICD-10-CM | POA: Diagnosis not present

## 2015-09-12 DIAGNOSIS — H532 Diplopia: Secondary | ICD-10-CM | POA: Diagnosis not present

## 2015-09-12 DIAGNOSIS — N184 Chronic kidney disease, stage 4 (severe): Secondary | ICD-10-CM | POA: Diagnosis not present

## 2015-09-12 DIAGNOSIS — I69398 Other sequelae of cerebral infarction: Secondary | ICD-10-CM | POA: Diagnosis not present

## 2015-09-12 DIAGNOSIS — I509 Heart failure, unspecified: Secondary | ICD-10-CM | POA: Diagnosis not present

## 2015-09-12 DIAGNOSIS — E1122 Type 2 diabetes mellitus with diabetic chronic kidney disease: Secondary | ICD-10-CM | POA: Diagnosis not present

## 2015-09-14 ENCOUNTER — Ambulatory Visit: Payer: Self-pay

## 2015-09-14 DIAGNOSIS — R0982 Postnasal drip: Secondary | ICD-10-CM | POA: Insufficient documentation

## 2015-09-14 DIAGNOSIS — K219 Gastro-esophageal reflux disease without esophagitis: Secondary | ICD-10-CM | POA: Diagnosis not present

## 2015-09-15 ENCOUNTER — Other Ambulatory Visit: Payer: Self-pay

## 2015-09-15 NOTE — Patient Outreach (Signed)
[facility]) Care Management  09/15/2015  Christine Santos 05/14/1945 [id number]   EMMI-Stroke  Follow Up Call    Outreach call to patient. Spoke with patient. Verified with patient that she did receive call from [facility]. She states that someone came out on Saturday and did and eval and collected medical info. She reports that someone is scheduled to come back out on tomorrow. Patient states she is doing well. Denies any new issues or concerns. No further RN CM needs at this time. Patient appreciative of RN CM assistance with getting [place] services in place.     Plan: RN CM will notify [facility] administrative assistant of case closure status.   Enzo Montgomery, RN,BSN,CCM [place] Management Telephonic Care Management Coordinator Direct Phone: [phone] Toll Free: [phone] Fax: [phone]

## 2015-09-16 DIAGNOSIS — N184 Chronic kidney disease, stage 4 (severe): Secondary | ICD-10-CM | POA: Diagnosis not present

## 2015-09-18 DIAGNOSIS — H35031 Hypertensive retinopathy, right eye: Secondary | ICD-10-CM | POA: Diagnosis not present

## 2015-09-18 DIAGNOSIS — H35032 Hypertensive retinopathy, left eye: Secondary | ICD-10-CM | POA: Diagnosis not present

## 2015-09-18 DIAGNOSIS — E113391 Type 2 diabetes mellitus with moderate nonproliferative diabetic retinopathy without macular edema, right eye: Secondary | ICD-10-CM | POA: Diagnosis not present

## 2015-09-18 DIAGNOSIS — H353122 Nonexudative age-related macular degeneration, left eye, intermediate dry stage: Secondary | ICD-10-CM | POA: Diagnosis not present

## 2015-09-18 DIAGNOSIS — E113392 Type 2 diabetes mellitus with moderate nonproliferative diabetic retinopathy without macular edema, left eye: Secondary | ICD-10-CM | POA: Diagnosis not present

## 2015-09-18 DIAGNOSIS — H353112 Nonexudative age-related macular degeneration, right eye, intermediate dry stage: Secondary | ICD-10-CM | POA: Diagnosis not present

## 2015-09-18 LAB — HM DIABETES EYE EXAM

## 2015-09-20 ENCOUNTER — Other Ambulatory Visit: Payer: Self-pay | Admitting: Family Medicine

## 2015-09-20 ENCOUNTER — Other Ambulatory Visit: Payer: Self-pay | Admitting: Cardiovascular Disease

## 2015-09-20 DIAGNOSIS — I10 Essential (primary) hypertension: Secondary | ICD-10-CM

## 2015-09-21 ENCOUNTER — Telehealth: Payer: Self-pay | Admitting: *Deleted

## 2015-09-21 DIAGNOSIS — N179 Acute kidney failure, unspecified: Secondary | ICD-10-CM

## 2015-09-21 DIAGNOSIS — N189 Chronic kidney disease, unspecified: Principal | ICD-10-CM

## 2015-09-21 MED ORDER — FUROSEMIDE 40 MG PO TABS: 40.0000 mg | ORAL_TABLET | ORAL | 3 refills | Status: DC

## 2015-09-21 NOTE — Telephone Encounter (Signed)
Patient states they took her off her fluid pill when she was in the hospital 2 weeks ago and now she is starting to have weight gain and some SOB, offered appointment but patient states she just needs to get started back on her fluid pill. Will forward to MD.

## 2015-09-21 NOTE — Telephone Encounter (Signed)
Review for refill, Thank you. 

## 2015-09-21 NOTE — Telephone Encounter (Signed)
Called and discussed.  Was on lasix 40 mg bid with a dry wt ~185 Lasix stopped due to bump in creat. Now wt up to 193 and she has DOE.  Per patient, creat down to 2.6. Will start lasix 40 qod.   Recheck lab one week.

## 2015-09-23 ENCOUNTER — Telehealth: Payer: Self-pay | Admitting: *Deleted

## 2015-09-23 NOTE — Telephone Encounter (Signed)
Please have her come either for a BP check tomorrow or a SDA tomorrow to recheck her BP. THANKS! Dorcas Mcmurray

## 2015-09-23 NOTE — Telephone Encounter (Signed)
Physical therapist from [facility] calling in to report pt's elevated BP (170/72), she still has dry cough, and has lost 3 1/2 lbs in 2 days. Deseree Kennon Holter, CMA

## 2015-09-23 NOTE — Telephone Encounter (Signed)
Contacted pt and nurse visit scheduled per Dr. Nori Riis. Tacy Dura Rumple, April D, CMA

## 2015-09-24 ENCOUNTER — Ambulatory Visit (INDEPENDENT_AMBULATORY_CARE_PROVIDER_SITE_OTHER): Payer: Medicare Other | Admitting: *Deleted

## 2015-09-24 VITALS — BP 180/52 | HR 52 | Wt 194.6 lb

## 2015-09-24 DIAGNOSIS — Z136 Encounter for screening for cardiovascular disorders: Secondary | ICD-10-CM

## 2015-09-24 DIAGNOSIS — I10 Essential (primary) hypertension: Secondary | ICD-10-CM

## 2015-09-24 DIAGNOSIS — Z0130 Encounter for examination of blood pressure without abnormal findings: Secondary | ICD-10-CM

## 2015-09-24 NOTE — Patient Instructions (Signed)
DASH Eating Plan  DASH stands for "Dietary Approaches to Stop Hypertension." The DASH eating plan is a healthy eating plan that has been shown to reduce high blood pressure (hypertension). Additional health benefits may include reducing the risk of type 2 diabetes mellitus, heart disease, and stroke. The DASH eating plan may also help with weight loss.  WHAT DO I NEED TO KNOW ABOUT THE DASH EATING PLAN?  For the DASH eating plan, you will follow these general guidelines:  · Choose foods with a percent daily value for sodium of less than 5% (as listed on the food label).  · Use salt-free seasonings or herbs instead of table salt or sea salt.  · Check with your health care provider or pharmacist before using salt substitutes.  · Eat lower-sodium products, often labeled as "lower sodium" or "no salt added."  · Eat fresh foods.  · Eat more vegetables, fruits, and low-fat dairy products.  · Choose whole grains. Look for the word "whole" as the first word in the ingredient list.  · Choose fish and skinless chicken or turkey more often than red meat. Limit fish, poultry, and meat to 6 oz (170 g) each day.  · Limit sweets, desserts, sugars, and sugary drinks.  · Choose heart-healthy fats.  · Limit cheese to 1 oz (28 g) per day.  · Eat more home-cooked food and less restaurant, buffet, and fast food.  · Limit fried foods.  · Cook foods using methods other than frying.  · Limit canned vegetables. If you do use them, rinse them well to decrease the sodium.  · When eating at a restaurant, ask that your food be prepared with less salt, or no salt if possible.  WHAT FOODS CAN I EAT?  Seek help from a dietitian for individual calorie needs.  Grains  Whole grain or whole wheat bread. Brown rice. Whole grain or whole wheat pasta. Quinoa, bulgur, and whole grain cereals. Low-sodium cereals. Corn or whole wheat flour tortillas. Whole grain cornbread. Whole grain crackers. Low-sodium crackers.  Vegetables  Fresh or frozen vegetables  (raw, steamed, roasted, or grilled). Low-sodium or reduced-sodium tomato and vegetable juices. Low-sodium or reduced-sodium tomato sauce and paste. Low-sodium or reduced-sodium canned vegetables.   Fruits  All fresh, canned (in natural juice), or frozen fruits.  Meat and Other Protein Products  Ground beef (85% or leaner), grass-fed beef, or beef trimmed of fat. Skinless chicken or turkey. Ground chicken or turkey. Pork trimmed of fat. All fish and seafood. Eggs. Dried beans, peas, or lentils. Unsalted nuts and seeds. Unsalted canned beans.  Dairy  Low-fat dairy products, such as skim or 1% milk, 2% or reduced-fat cheeses, low-fat ricotta or cottage cheese, or plain low-fat yogurt. Low-sodium or reduced-sodium cheeses.  Fats and Oils  Tub margarines without trans fats. Light or reduced-fat mayonnaise and salad dressings (reduced sodium). Avocado. Safflower, olive, or canola oils. Natural peanut or almond butter.  Other  Unsalted popcorn and pretzels.  The items listed above may not be a complete list of recommended foods or beverages. Contact your dietitian for more options.  WHAT FOODS ARE NOT RECOMMENDED?  Grains  White bread. White pasta. White rice. Refined cornbread. Bagels and croissants. Crackers that contain trans fat.  Vegetables  Creamed or fried vegetables. Vegetables in a cheese sauce. Regular canned vegetables. Regular canned tomato sauce and paste. Regular tomato and vegetable juices.  Fruits  Dried fruits. Canned fruit in light or heavy syrup. Fruit juice.  Meat and Other Protein   Products  Fatty cuts of meat. Ribs, chicken wings, bacon, sausage, bologna, salami, chitterlings, fatback, hot dogs, bratwurst, and packaged luncheon meats. Salted nuts and seeds. Canned beans with salt.  Dairy  Whole or 2% milk, cream, half-and-half, and cream cheese. Whole-fat or sweetened yogurt. Full-fat cheeses or blue cheese. Nondairy creamers and whipped toppings. Processed cheese, cheese spreads, or cheese  curds.  Condiments  Onion and garlic salt, seasoned salt, table salt, and sea salt. Canned and packaged gravies. Worcestershire sauce. Tartar sauce. Barbecue sauce. Teriyaki sauce. Soy sauce, including reduced sodium. Steak sauce. Fish sauce. Oyster sauce. Cocktail sauce. Horseradish. Ketchup and mustard. Meat flavorings and tenderizers. Bouillon cubes. Hot sauce. Tabasco sauce. Marinades. Taco seasonings. Relishes.  Fats and Oils  Butter, stick margarine, lard, shortening, ghee, and bacon fat. Coconut, palm kernel, or palm oils. Regular salad dressings.  Other  Pickles and olives. Salted popcorn and pretzels.  The items listed above may not be a complete list of foods and beverages to avoid. Contact your dietitian for more information.  WHERE CAN I FIND MORE INFORMATION?  National Heart, Lung, and Blood Institute: www.nhlbi.nih.gov/health/health-topics/topics/dash/     This information is not intended to replace advice given to you by your health care provider. Make sure you discuss any questions you have with your health care provider.     Document Released: 12/30/2010 Document Revised: 01/31/2014 Document Reviewed: 11/14/2012  Elsevier Interactive Patient Education ©2016 Elsevier Inc.

## 2015-09-24 NOTE — Progress Notes (Signed)
   Patient presents for BP check after elevated BP noted by PT at 170/72 yesterday. Med list reviewed; states taking all meds as directed. Patient has been taking lasix 40 mg daily X 3 days  Patient positive for [facility] on exertion Denies headaches, blurred vision, chest pain, dizziness   Discussed using Mrs. Dash as alternative to salt Encouraged to choose foods with 5% or less of daily value for sodium. Discussed elevating LE when sitting   Patient sat for 5 minutes prior to manual BP check  Vitals:   09/24/15 1201 09/24/15 1209  BP: (!) 180/52 (!) 180/52  Pulse: (!) 52    BP rechecked at 180/52  left arm manually with adult cuff  Patient advised to call for med refills at least 7 days before running out so as not to go without.  Patient aware that she is to f/u with PCP 1 month from last visit (Due 10/11/2015) and sched lab visit for 09/29/2015  Patient given literature on DASH Eating Plan  Discussed BPs with Preceptor prior to patient discharge. Nothing necessary at this time. Note routed to PCP. Velora Heckler, RN

## 2015-09-25 ENCOUNTER — Telehealth: Payer: Self-pay | Admitting: Family Medicine

## 2015-09-25 NOTE — Telephone Encounter (Signed)
[facility]:  Pts bp has been running high this week.  Today it is 170/90.  She is retaining fluid and her weight is 189.  Pt would like to talk to Dr Andria Frames about her bp issues

## 2015-09-25 NOTE — Telephone Encounter (Signed)
Dear Dema Severin Team Please let her know Dr Andria Frames is out of town until next [place]! Dorcas Mcmurray

## 2015-09-25 NOTE — Telephone Encounter (Signed)
Contacted pt and she is concerned with her BP, I scheduled her and appointment on Wednesday to meet with Dr. Andria Frames.  Also cancelled her lab on Tuesday and she will have them done on Wednesday so she does not need to make two trips in one week.  Also told her that if she feels like her BP is concerning over the weekend that she could contact our on call doctor to be advised as to what she should do. Christine Santos, Uzziah Rigg D, [place]

## 2015-09-29 ENCOUNTER — Other Ambulatory Visit: Payer: Medicare Other

## 2015-09-30 ENCOUNTER — Encounter: Payer: Self-pay | Admitting: Family Medicine

## 2015-09-30 ENCOUNTER — Ambulatory Visit (INDEPENDENT_AMBULATORY_CARE_PROVIDER_SITE_OTHER): Payer: Medicare Other | Admitting: Family Medicine

## 2015-09-30 ENCOUNTER — Other Ambulatory Visit: Payer: Self-pay | Admitting: Family Medicine

## 2015-09-30 DIAGNOSIS — Z23 Encounter for immunization: Secondary | ICD-10-CM

## 2015-09-30 DIAGNOSIS — N184 Chronic kidney disease, stage 4 (severe): Secondary | ICD-10-CM | POA: Diagnosis not present

## 2015-09-30 DIAGNOSIS — I10 Essential (primary) hypertension: Secondary | ICD-10-CM

## 2015-09-30 DIAGNOSIS — I5022 Chronic systolic (congestive) heart failure: Secondary | ICD-10-CM | POA: Diagnosis not present

## 2015-09-30 DIAGNOSIS — I130 Hypertensive heart and chronic kidney disease with heart failure and stage 1 through stage 4 chronic kidney disease, or unspecified chronic kidney disease: Secondary | ICD-10-CM

## 2015-09-30 NOTE — Progress Notes (Signed)
   Subjective:    Patient ID: Christine Santos, female    DOB: [birth year], 70 y.o.   MRN: [id number]  HPI FU hypertension and acute kidney injury.  Patient hospitalized with a CVA.  She is recovering nicely.  At DC, she was noted to have an acute rise in her creat to 5.5 from mid 2 range of CKD.  Lasix and ACE stopped.  Reportedly, labs from her renal doc show creat has come back down to 2.6. Reason: she was kept NPO for a long time due to CVA.  IV fluids were started late.  Unclear if lasix was held. She continues to have leg swelling despite being put back on lasix every other day.    Review of Systems     Objective:   Physical Exam BP up (confirmed).  Wt down 4 lbs but still 6 lbs above her previous baseline. Lungs clear Cardiac RRR without m or g Ext 2-3+ pedal edema bilaterally        Assessment & Plan:

## 2015-09-30 NOTE — Assessment & Plan Note (Signed)
Poor control.  Check BMP, if still OK, likely increase lasix back to daily.  Recheck in 3 weeks.

## 2015-09-30 NOTE — Progress Notes (Signed)
Reviewed and agree.

## 2015-09-30 NOTE — Patient Instructions (Signed)
I will call tomorrow with the results of the creatinine.  If OK, I will increase your lasix to every day since you still have several pounds of extra fluid in you.  Getting the fluid off you should help your blood pressure.  Keep the appointment on the 9/28.

## 2015-09-30 NOTE — Assessment & Plan Note (Addendum)
Some improved but still volume overloaded.  Likely increase to daily lasix.    Phone follow up.  Creat=3.0 Per my scales, wt yesterday was up 6 lbs over dry weight.  Per patient scales, she weighed 186: ergo estimated dry weight =180 on patient scale.  Will give daily lasix provided morning wt >180.  Hold lasix if wt is 180 or less.

## 2015-09-30 NOTE — Assessment & Plan Note (Signed)
Acute injury likely resolved.  Recheck and likely increase lasix.

## 2015-10-01 LAB — BASIC METABOLIC PANEL WITH GFR
BUN: 57 mg/dL — AB (ref 7–25)
CHLORIDE: 104 mmol/L (ref 98–110)
CO2: 26 mmol/L (ref 20–31)
CREATININE: 3.04 mg/dL — AB (ref 0.60–0.93)
Calcium: 8.7 mg/dL (ref 8.6–10.4)
GFR, Est African American: 17 mL/min — ABNORMAL LOW (ref >=60)
GFR, Est Non African American: 15 mL/min — ABNORMAL LOW (ref >=60)
Glucose, Bld: 171 mg/dL — ABNORMAL HIGH (ref 65–99)
POTASSIUM: 4.6 mmol/L (ref 3.5–5.3)
Sodium: 140 mmol/L (ref 135–146)

## 2015-10-02 ENCOUNTER — Other Ambulatory Visit: Payer: Self-pay | Admitting: Family Medicine

## 2015-10-08 ENCOUNTER — Encounter: Payer: Self-pay | Admitting: Nurse Practitioner

## 2015-10-08 ENCOUNTER — Encounter: Payer: Self-pay | Admitting: *Deleted

## 2015-10-08 ENCOUNTER — Encounter (HOSPITAL_COMMUNITY): Payer: Self-pay | Admitting: Nurse Practitioner

## 2015-10-08 ENCOUNTER — Ambulatory Visit (HOSPITAL_COMMUNITY)
Admission: RE | Admit: 2015-10-08 | Discharge: 2015-10-08 | Disposition: A | Discharge status: Home / Self Care | Payer: Medicare Other | Source: Ambulatory Visit | Attending: Nurse Practitioner | Admitting: Nurse Practitioner

## 2015-10-08 VITALS — BP 194/68 | HR 62 | Ht 65.5 in | Wt 188.0 lb

## 2015-10-08 DIAGNOSIS — I5032 Chronic diastolic (congestive) heart failure: Secondary | ICD-10-CM | POA: Diagnosis not present

## 2015-10-08 DIAGNOSIS — Z7902 Long term (current) use of antithrombotics/antiplatelets: Secondary | ICD-10-CM | POA: Diagnosis not present

## 2015-10-08 DIAGNOSIS — I130 Hypertensive heart and chronic kidney disease with heart failure and stage 1 through stage 4 chronic kidney disease, or unspecified chronic kidney disease: Secondary | ICD-10-CM | POA: Insufficient documentation

## 2015-10-08 DIAGNOSIS — Z8673 Personal history of transient ischemic attack (TIA), and cerebral infarction without residual deficits: Secondary | ICD-10-CM | POA: Insufficient documentation

## 2015-10-08 DIAGNOSIS — Z880 Allergy status to penicillin: Secondary | ICD-10-CM | POA: Diagnosis not present

## 2015-10-08 DIAGNOSIS — Z79899 Other long term (current) drug therapy: Secondary | ICD-10-CM | POA: Insufficient documentation

## 2015-10-08 DIAGNOSIS — E1122 Type 2 diabetes mellitus with diabetic chronic kidney disease: Secondary | ICD-10-CM | POA: Diagnosis not present

## 2015-10-08 DIAGNOSIS — I4891 Unspecified atrial fibrillation: Secondary | ICD-10-CM | POA: Diagnosis present

## 2015-10-08 DIAGNOSIS — Z7984 Long term (current) use of oral hypoglycemic drugs: Secondary | ICD-10-CM | POA: Diagnosis not present

## 2015-10-08 DIAGNOSIS — I2510 Atherosclerotic heart disease of native coronary artery without angina pectoris: Secondary | ICD-10-CM | POA: Diagnosis not present

## 2015-10-08 DIAGNOSIS — Z7982 Long term (current) use of aspirin: Secondary | ICD-10-CM | POA: Insufficient documentation

## 2015-10-08 DIAGNOSIS — N189 Chronic kidney disease, unspecified: Secondary | ICD-10-CM | POA: Diagnosis not present

## 2015-10-08 DIAGNOSIS — I639 Cerebral infarction, unspecified: Secondary | ICD-10-CM

## 2015-10-08 DIAGNOSIS — Z006 Encounter for examination for normal comparison and control in clinical research program: Secondary | ICD-10-CM

## 2015-10-08 NOTE — Progress Notes (Signed)
STROKE-AF month 1 device check. Patient denies any adverse events. No changes in her health since discharge, continues to improve daily. EQ-5D questionnaire completed.

## 2015-10-09 ENCOUNTER — Telehealth: Payer: Self-pay | Admitting: *Deleted

## 2015-10-09 NOTE — Telephone Encounter (Signed)
Agree 

## 2015-10-09 NOTE — Telephone Encounter (Signed)
Shanon Brow, Physical Therapist with [facility] called stating patient will be discharge from physical therapy care today.  Also to report that patient's BP is still running high.  BP 160/56 today, denies any other symptoms. Requesting an appointment with PCP sooner than 10/22/15.  Precept with Dr. Andria Frames; he is aware of patient's blood pressures.  Ok to reschedule for first available appointment.  Appointment 10/14/15 at 11 AM.  Patient is aware of appointment.  Derl Barrow, RN

## 2015-10-09 NOTE — Progress Notes (Signed)
Primary Care Physician: Zigmund Gottron, MD Referring Physician:[facility] f/u   Christine Santos is a 70 y.o. female with a h/o cryptogenic stroke, CKD, DM, HTN,CAD, that is in the afib clinic for f/u recent hospitalization per schedule" f/u ablation" as scheduled by someone on discharge from her recent stroke. However, she did not have an ablation but had a  linq implanted. She also was just seen in the research department this am as well for cyrtogenic stroke protocol. Her device was interrogated and there was no evidence of atrial arrhythmias. Site has healed. Several of her meds were stopped in the hospital for acute on chronic renal failure and BP was elevated onp presentation by rechecked by me after several minutes of resting was acceptable at 148/68.Her pcp is monitoring her Creatinine and BP and  Has f/u with him again in a couple of weeks. On asa and plavix.   Today, she denies symptoms of palpitations, chest pain, shortness of breath, orthopnea, PND, lower extremity edema, dizziness, presyncope, syncope, or neurologic sequela. The patient is tolerating medications without difficulties and is otherwise without complaint today.   Past Medical History:  Diagnosis Date  . Anemia 08/29/2011  . Arthritis    "legs" (01/22/2013)  . CAD (coronary artery disease)    s/p CABG in 1999  . CLL (chronic lymphoblastic leukemia)   . Diastolic heart failure ([place])   . GERD (gastroesophageal reflux disease)   . H/O hiatal hernia   . Heart murmur   . Hyperlipidemia   . Hypertension   . IDDM (insulin dependent diabetes mellitus) ([place])   . Myocardial infarction [facility])    "I had one but didn't know it" (01/22/2013)  . PAD (peripheral artery disease) ([place])   . Peripheral vascular disease ([place])   . Stroke (cerebrum) [facility])    Past Surgical History:  Procedure Laterality Date  . ANGIOPLASTY / STENTING FEMORAL Right 09/2010   SFA/notes 11/25/2010 (01/22/2013)  . ANGIOPLASTY / STENTING FEMORAL Left  11/2010   SFA/notes 11/25/2010 (01/22/2013)  . ANGIOPLASTY / STENTING ILIAC     [name] 11/25/2010 (01/22/2013)  . CARDIAC CATHETERIZATION     "I've had 2-4" (01/22/2013)  . CATARACT EXTRACTION W/ INTRAOCULAR LENS  IMPLANT, BILATERAL Bilateral ?2011  . CHOLECYSTECTOMY  1993  . CORONARY ARTERY BYPASS GRAFT  10/1994   "CABG X3"  . EP IMPLANTABLE DEVICE N/A 09/11/2015   Procedure: Loop Recorder Insertion;  Surgeon: Thompson Grayer, MD;  Location: [place] CV LAB;  Service: Cardiovascular;  Laterality: N/A;  . HEEL SPUR EXCISION Bilateral 1970's  . LOWER EXTREMITY ANGIOGRAM N/A 01/22/2013   Procedure: LOWER EXTREMITY ANGIOGRAM;  Surgeon: [name], MD;  Location: [facility] CATH LAB;  Service: Cardiovascular;  Laterality: N/A;  . SHOULDER OPEN ROTATOR CUFF REPAIR Bilateral 1990's   "2 times on 1 side; once on the other"  . TRANSLUMINAL ATHERECTOMY FEMORAL ARTERY Right 01/22/2013   & balloon  . TUBAL LIGATION  1980's    Current Outpatient Prescriptions  Medication Sig Dispense Refill  . allopurinol (ZYLOPRIM) 100 MG tablet Take 1 tablet (100 mg total) by mouth daily. 90 tablet 3  . amLODipine (NORVASC) 10 MG tablet TAKE 1 TABLET(10 MG) BY MOUTH DAILY 90 tablet 3  . Ascorbic Acid (VITAMIN C) 100 MG tablet Take 100 mg by mouth daily.    Marland Kitchen aspirin 81 MG tablet Take 81 mg by mouth daily.    Marland Kitchen atorvastatin (LIPITOR) 80 MG tablet TAKE 1 TABLET BY MOUTH DAILY 90 tablet  2  . carboxymethylcellulose (REFRESH PLUS) 0.5 % SOLN 1 drop 3 (three) times daily as needed.    . cloNIDine (CATAPRES) 0.3 MG tablet Take 1 tablet (0.3 mg total) by mouth 3 (three) times daily. 270 tablet 3  . clopidogrel (PLAVIX) 75 MG tablet TAKE 1 TABLET BY MOUTH DAILY 90 tablet 3  . fenofibrate (TRICOR) 48 MG tablet Take 1 tablet (48 mg total) by mouth daily. 30 tablet 4  . ferrous sulfate 325 (65 FE) MG tablet Take 1 tablet (325 mg total) by mouth daily with breakfast.  3  . fluticasone (FLONASE) 50 MCG/ACT nasal spray Place  2 sprays into both nostrils daily. 16 g 6  . furosemide (LASIX) 40 MG tablet Take 1 tablet (40 mg total) by mouth every other day. (Patient taking differently: Take 40 mg by mouth daily. ) 45 tablet 3  . GNP GARLIC EXTRACT PO Take 1 tablet by mouth daily.     Marland Kitchen HUMALOG KWIKPEN 100 UNIT/ML KiwkPen ADMINISTER 5 UNITS UNDER THE SKIN THREE TIMES DAILY BEFORE MEALS 15 mL 12  . LANTUS SOLOSTAR 100 UNIT/ML Solostar Pen INJECT 35 UNITS UNDER THE SKIN DAILY (Patient taking differently: INJECT 40 UNITS UNDER THE SKIN DAILY) 15 mL 12  . Melatonin 10 MG CAPS Take 10 mg by mouth at bedtime as needed (sleep).     . metoprolol tartrate (LOPRESSOR) 25 MG tablet TAKE 1/2 TABLET BY MOUTH TWICE DAILY 90 tablet 3  . Multiple Vitamins-Minerals (MULTIVITAMINS THER. W/MINERALS) TABS Take 1 tablet by mouth daily.      . Omega-3 Fatty Acids (FISH OIL) 1000 MG CAPS Take 2,000 mg by mouth 2 (two) times daily.     Marland Kitchen OVER THE COUNTER MEDICATION Take 1 tablet by mouth 2 (two) times daily. Macular Degenerative Eye Supplement    . pantoprazole (PROTONIX) 40 MG tablet TAKE 1 TABLET BY MOUTH DAILY 90 tablet 3  . PARoxetine (PAXIL) 20 MG tablet TAKE 1 TABLET BY MOUTH EVERY MORNING 90 tablet 3  . traMADol (ULTRAM) 50 MG tablet TAKE 1 TABLET BY MOUTH FOUR TIMES DAILY 360 tablet 1   No current facility-administered medications for this encounter.     Allergies  Allergen Reactions  . Amoxicillin Rash    Has patient had a PCN reaction causing immediate rash, facial/tongue/throat swelling, SOB or lightheadedness with hypotension: No Has patient had a PCN reaction causing severe rash involving mucus membranes or skin necrosis: No Has patient had a PCN reaction that required hospitalization: No Has patient had a PCN reaction occurring within the last 10 years: No If all of the above answers are "NO", then may proceed with Cephalosporin use.   . Cephalexin Rash  . Codeine Phosphate Rash    Social History   Social History  .  Marital status: Divorced    Spouse name: N/A  . Number of children: 2  . Years of education: 12   Occupational History  . DISABILITY Other    Worked at [employer] Topics  . Smoking status: Never Smoker  . Smokeless tobacco: Never Used  . Alcohol use No  . Drug use: No  . Sexual activity: Not Currently   Other Topics Concern  . Not on file   Social History Narrative   Health Care POA:    Emergency Contact: sister, [name] (h) [phone]   End of Life Plan:    Who lives with you: female friend   Any pets: none   Diet:  Pt has a varied diet of protein, starch, vegetables   Exercise: Pt does not have any regular exercise routine.   Seatbelts: Pt reports wearing seatbelt when in vehicles.    Sun Exposure/Protection: Pt reports using sun screen.   Hobbies: going to lake, biking, blue grass music          Family History  Problem Relation Age of Onset  . Heart disease Mother   . Heart attack Mother   . Pneumonia Father     ROS- All systems are reviewed and negative except as per the HPI above  Physical Exam: Vitals:   10/08/15 1140  BP: (!) 194/68  Pulse: 62  Weight: 188 lb (85.3 kg)  Height: 5' 5.5" (1.664 m)    GEN- The patient is well appearing, alert and oriented x 3 today.   Head- normocephalic, atraumatic Eyes-  Sclera clear, conjunctiva pink Ears- hearing intact Oropharynx- clear Neck- supple, no JVP Lymph- no cervical lymphadenopathy Lungs- Clear to ausculation bilaterally, normal work of breathing Heart- Regular rate and rhythm, no murmurs, rubs or gallops, PMI not laterally displaced, linq site healed GI- soft, NT, ND, + BS Extremities- no clubbing, cyanosis, or edema MS- no significant deformity or atrophy Skin- no rash or lesion Psych- euthymic mood, full affect Neuro- strength and sensation are intact  EKG- NSR at 62 bpm, Pr int 168 ms, qrs int, ms, qtc 436 ms Epic records reviewed Device interrogated by research and  no atrial arrhythmias  Assessment and Plan: 1. Cryptogenic stroke Linq implanted  Site healed Continue asa and plavix  2. HTN Per PCP  Lisinopril stopped and lasix decreased in hospital due to creat of 5.5, last drawn 9/6 and improved at 3.04 Initially  elevated but rechecked at acceptable range 148/68  3. CKD Creatinine improving  F/u per pcp  F/u pcp 9/28 afib clinic as needed  Butch Penny C. Simora Dingee, [facility] [address] [place], [place] [address] [phone]

## 2015-10-09 NOTE — Telephone Encounter (Signed)
Case Manager calls to inform us that pts BP has been running 160-180/56-60.  She did not know if pt was symptomatic, but pt did tell her that they had recently adjusted medications.Christine Santos, Christine Santos, Christine Santos

## 2015-10-14 ENCOUNTER — Encounter: Payer: Self-pay | Admitting: Family Medicine

## 2015-10-14 ENCOUNTER — Ambulatory Visit (INDEPENDENT_AMBULATORY_CARE_PROVIDER_SITE_OTHER): Payer: Medicare Other | Admitting: Family Medicine

## 2015-10-14 DIAGNOSIS — N183 Chronic kidney disease, stage 3 unspecified: Secondary | ICD-10-CM

## 2015-10-14 DIAGNOSIS — E118 Type 2 diabetes mellitus with unspecified complications: Secondary | ICD-10-CM

## 2015-10-14 DIAGNOSIS — N184 Chronic kidney disease, stage 4 (severe): Secondary | ICD-10-CM

## 2015-10-14 DIAGNOSIS — I5022 Chronic systolic (congestive) heart failure: Secondary | ICD-10-CM

## 2015-10-14 DIAGNOSIS — E1149 Type 2 diabetes mellitus with other diabetic neurological complication: Secondary | ICD-10-CM

## 2015-10-14 LAB — BASIC METABOLIC PANEL WITH GFR
BUN: 48 mg/dL — AB (ref 7–25)
CHLORIDE: 105 mmol/L (ref 98–110)
CO2: 25 mmol/L (ref 20–31)
CREATININE: 2.56 mg/dL — AB (ref 0.60–0.93)
Calcium: 8.6 mg/dL (ref 8.6–10.4)
GFR, Est African American: 21 mL/min — ABNORMAL LOW (ref >=60)
GFR, Est Non African American: 18 mL/min — ABNORMAL LOW (ref >=60)
GLUCOSE: 124 mg/dL — AB (ref 65–99)
POTASSIUM: 4.7 mmol/L (ref 3.5–5.3)
Sodium: 140 mmol/L (ref 135–146)

## 2015-10-14 MED ORDER — INSULIN GLARGINE 100 UNIT/ML SOLOSTAR PEN: 37.0000 [IU] | PEN_INJECTOR | Freq: Every day | SUBCUTANEOUS | 12 refills | Status: DC

## 2015-10-14 NOTE — Assessment & Plan Note (Signed)
Back on lasix.  Slightly above dry wt and edema.  No change for now.

## 2015-10-14 NOTE — Assessment & Plan Note (Signed)
Recheck now to define new baseline.  Need to decide whether or not to restart ACE.  Will await creat, but I suspect not.

## 2015-10-14 NOTE — Progress Notes (Signed)
   Subjective:    Patient ID: Christine Santos, female    DOB: [birth year], 70 y.o.   MRN: [id number]  HPI FU recent hospitalization, multiple medical problems. 1. AKI Off lisinopril.  Back on lasix.  Wt up some.  C/O some ankle swelling.  Baseline creat? Maybe 2.5.  Last creat 2 weeks ago was 3.0 2. Wants to know if she can drive now.  Vision back to normal after recent lacunar infarct and cleared by ophth.  OK to drive 3. HBP systolic.  Diastolics borderline low.   4. DM.  Having some low fastings.  Some sweating and vivid dreams in middle of night.      Review of Systems     Objective:   Physical ExamLungs clear Cardiac RRR without m or g Abd benign 2+ peripheral edema        Assessment & Plan:

## 2015-10-14 NOTE — Assessment & Plan Note (Signed)
Some hypoglycemia.  Decrease lantus to 37 units.

## 2015-10-14 NOTE — Patient Instructions (Addendum)
Decrease your lantus to 37 units daily.  Stay on the humalog. I will call tomorrow with the creatinine results.   OK to drive See me in two months.

## 2015-10-15 ENCOUNTER — Telehealth: Payer: Self-pay | Admitting: Family Medicine

## 2015-10-15 NOTE — Telephone Encounter (Signed)
Pt is calling because she thought that Dr. Andria Frames ws going to be calling her about some test. jw

## 2015-10-15 NOTE — Telephone Encounter (Signed)
Called and given results

## 2015-10-21 ENCOUNTER — Encounter: Payer: Self-pay | Admitting: Internal Medicine

## 2015-10-21 ENCOUNTER — Telehealth: Payer: Self-pay | Admitting: *Deleted

## 2015-10-21 DIAGNOSIS — I480 Paroxysmal atrial fibrillation: Secondary | ICD-10-CM

## 2015-10-21 NOTE — Telephone Encounter (Signed)
LMOVM requesting call back.  [facility] phone number.  Will request that patient send a manual transmission from her home monitor when she returns call.

## 2015-10-21 NOTE — Telephone Encounter (Signed)
Follow Up:; ° ° °Returning your call. °

## 2015-10-21 NOTE — Telephone Encounter (Signed)
Spoke with patient.  Walked her through manual transmission steps.  Full report successfully received.  Advised patient that Dr. Rayann Heman will review the transmission and I will call her back if there are any further recommendations.  Patient verbalizes understanding and denies additional questions or concerns at this time.

## 2015-10-22 ENCOUNTER — Ambulatory Visit: Payer: Medicare Other | Admitting: Family Medicine

## 2015-10-22 DIAGNOSIS — I480 Paroxysmal atrial fibrillation: Secondary | ICD-10-CM | POA: Insufficient documentation

## 2015-10-22 MED ORDER — APIXABAN 5 MG PO TABS: 5.0000 mg | ORAL_TABLET | Freq: Two times a day (BID) | ORAL | 11 refills | Status: DC

## 2015-10-22 NOTE — Addendum Note (Signed)
Addended by: Mechele Dawley on: 10/22/2015 04:38 PM   Modules accepted: Orders

## 2015-10-22 NOTE — Telephone Encounter (Signed)
Transmission reviewed by Dr. Lovena Le, ECG shows AF, Dr. Rayann Heman made aware.  Dr. Lovena Le ordered Eliquis 5mg  BID and ordered ASA and Plavix to be discontinued.  Patient made aware of new AF and of instructions to STOP ASA and Plavix and START Eliquis 5mg  BID.  Samples and trial card placed at front desk for pick-up.  Patient agreeable to appointment with AF Clinic on 10/26/15 at 11:30am.  Patient verbalizes understanding of all instructions and denies additional questions or concerns at this time.

## 2015-10-24 ENCOUNTER — Encounter: Payer: Self-pay | Admitting: Nurse Practitioner

## 2015-10-26 ENCOUNTER — Encounter (HOSPITAL_COMMUNITY): Payer: Self-pay | Admitting: Nurse Practitioner

## 2015-10-26 ENCOUNTER — Ambulatory Visit (HOSPITAL_COMMUNITY)
Admission: RE | Admit: 2015-10-26 | Discharge: 2015-10-26 | Disposition: A | Discharge status: Home / Self Care | Payer: Medicare Other | Source: Ambulatory Visit | Attending: Nurse Practitioner | Admitting: Nurse Practitioner

## 2015-10-26 DIAGNOSIS — N183 Chronic kidney disease, stage 3 (moderate): Secondary | ICD-10-CM | POA: Insufficient documentation

## 2015-10-26 DIAGNOSIS — I10 Essential (primary) hypertension: Secondary | ICD-10-CM | POA: Diagnosis not present

## 2015-10-26 DIAGNOSIS — Z951 Presence of aortocoronary bypass graft: Secondary | ICD-10-CM | POA: Diagnosis not present

## 2015-10-26 DIAGNOSIS — Z7901 Long term (current) use of anticoagulants: Secondary | ICD-10-CM | POA: Insufficient documentation

## 2015-10-26 DIAGNOSIS — I129 Hypertensive chronic kidney disease with stage 1 through stage 4 chronic kidney disease, or unspecified chronic kidney disease: Secondary | ICD-10-CM | POA: Insufficient documentation

## 2015-10-26 DIAGNOSIS — Z880 Allergy status to penicillin: Secondary | ICD-10-CM | POA: Insufficient documentation

## 2015-10-26 DIAGNOSIS — N184 Chronic kidney disease, stage 4 (severe): Secondary | ICD-10-CM | POA: Diagnosis not present

## 2015-10-26 DIAGNOSIS — I480 Paroxysmal atrial fibrillation: Secondary | ICD-10-CM

## 2015-10-26 DIAGNOSIS — Z79899 Other long term (current) drug therapy: Secondary | ICD-10-CM | POA: Insufficient documentation

## 2015-10-26 DIAGNOSIS — I2510 Atherosclerotic heart disease of native coronary artery without angina pectoris: Secondary | ICD-10-CM | POA: Diagnosis not present

## 2015-10-26 LAB — BASIC METABOLIC PANEL
Anion gap: 11 (ref 5–15)
BUN: 48 mg/dL — ABNORMAL HIGH (ref 6–20)
CALCIUM: 9.1 mg/dL (ref 8.9–10.3)
CO2: 27 mmol/L (ref 22–32)
CREATININE: 2.76 mg/dL — AB (ref 0.44–1.00)
Chloride: 103 mmol/L (ref 101–111)
GFR calc Af Amer: 19 mL/min — ABNORMAL LOW (ref >60)
GFR calc non Af Amer: 16 mL/min — ABNORMAL LOW (ref >60)
GLUCOSE: 137 mg/dL — AB (ref 65–99)
Potassium: 4.3 mmol/L (ref 3.5–5.1)
Sodium: 141 mmol/L (ref 135–145)

## 2015-10-26 LAB — CBC
HEMATOCRIT: 33.2 % — AB (ref 36.0–46.0)
Hemoglobin: 10.2 g/dL — ABNORMAL LOW (ref 12.0–15.0)
MCH: 29.9 pg (ref 26.0–34.0)
MCHC: 30.7 g/dL (ref 30.0–36.0)
MCV: 97.4 fL (ref 78.0–100.0)
Platelets: 350 10*3/uL (ref 150–400)
RBC: 3.41 MIL/uL — ABNORMAL LOW (ref 3.87–5.11)
RDW: 16.8 % — AB (ref 11.5–15.5)
WBC: 9.8 10*3/uL (ref 4.0–10.5)

## 2015-10-26 NOTE — [place] Notes (Signed)
Primary Care Physician: Zigmund Gottron, MD  Cardiologist: Irish Lack  Primary Electrophysiologist: Allred Neurologist: Leonie Man  Referring Physician: Veatrice Bourbon is a 70 y.o. female with a history of prior CVA who was enrolled in the Stroke AF study.  Recent ILR interrogation has demonstrated AF and she was asked to follow up today in the AF clinic. She has been started on Eliquis and is tolerating without bleeding issues. She is asymptomatic with her atrial fibrillation.    Today, she denies symptoms of palpitations, chest pain, shortness of breath, orthopnea, PND, lower extremity edema, dizziness, presyncope, syncope, snoring, daytime somnolence, bleeding, or neurologic sequela. The patient is tolerating medications without difficulties and is otherwise without complaint today.    Past Medical History:  Diagnosis Date  . Anemia 08/29/2011  . Arthritis    "legs" (01/22/2013)  . CAD (coronary artery disease)    s/p CABG in 1999  . CLL (chronic lymphoblastic leukemia)   . Diastolic heart failure ([place])   . GERD (gastroesophageal reflux disease)   . H/O hiatal hernia   . Hyperlipidemia   . Hypertension   . IDDM (insulin dependent diabetes mellitus) ([place])   . PAD (peripheral artery disease) ([place])   . Paroxysmal atrial fibrillation ([facility])    a. identified on ILR as part of StrokeAF study  . Peripheral vascular disease ([place])   . Stroke (cerebrum) [facility])    Past Surgical History:  Procedure Laterality Date  . ANGIOPLASTY / STENTING FEMORAL Right 09/2010   SFA/notes 11/25/2010 (01/22/2013)  . ANGIOPLASTY / STENTING FEMORAL Left 11/2010   SFA/notes 11/25/2010 (01/22/2013)  . ANGIOPLASTY / STENTING ILIAC     [name] 11/25/2010 (01/22/2013)  . CATARACT EXTRACTION W/ INTRAOCULAR LENS  IMPLANT, BILATERAL Bilateral ?2011  . CHOLECYSTECTOMY  1993  . CORONARY ARTERY BYPASS GRAFT  10/1994   "CABG X3"  . EP IMPLANTABLE DEVICE N/A 09/11/2015   Procedure: Loop Recorder Insertion;   Surgeon: Thompson Grayer, MD;  Location: [facility] CV LAB;  Service: Cardiovascular;  Laterality: N/A;  . HEEL SPUR EXCISION Bilateral 1970's  . LOWER EXTREMITY ANGIOGRAM N/A 01/22/2013   Procedure: LOWER EXTREMITY ANGIOGRAM;  Surgeon: [name], MD;  Location: [facility] CATH LAB;  Service: Cardiovascular;  Laterality: N/A;  . SHOULDER OPEN ROTATOR CUFF REPAIR Bilateral 1990's   "2 times on 1 side; once on the other"  . TRANSLUMINAL ATHERECTOMY FEMORAL ARTERY Right 01/22/2013   & balloon  . TUBAL LIGATION  1980's    Current Outpatient Prescriptions  Medication Sig Dispense Refill  . allopurinol (ZYLOPRIM) 100 MG tablet Take 1 tablet (100 mg total) by mouth daily. 90 tablet 3  . amLODipine (NORVASC) 10 MG tablet TAKE 1 TABLET(10 MG) BY MOUTH DAILY 90 tablet 3  . apixaban (ELIQUIS) 5 MG TABS tablet Take 1 tablet (5 mg total) by mouth 2 (two) times daily. 60 tablet 11  . Ascorbic Acid (VITAMIN C) 100 MG tablet Take 100 mg by mouth daily.    Marland Kitchen atorvastatin (LIPITOR) 80 MG tablet TAKE 1 TABLET BY MOUTH DAILY 90 tablet 2  . carboxymethylcellulose (REFRESH PLUS) 0.5 % SOLN 1 drop 3 (three) times daily as needed.    . cloNIDine (CATAPRES) 0.3 MG tablet Take 1 tablet (0.3 mg total) by mouth 3 (three) times daily. 270 tablet 3  . fenofibrate (TRICOR) 48 MG tablet Take 1 tablet (48 mg total) by mouth daily. 30 tablet 4  . ferrous sulfate 325 (65 FE) MG tablet Take 1 tablet (325  mg total) by mouth daily with breakfast.  3  . fluticasone (FLONASE) 50 MCG/ACT nasal spray Place 2 sprays into both nostrils daily. 16 g 6  . furosemide (LASIX) 40 MG tablet Take 1 tablet (40 mg total) by mouth every other day. (Patient taking differently: Take 40 mg by mouth daily. ) 45 tablet 3  . GNP GARLIC EXTRACT PO Take 1 tablet by mouth daily.     Marland Kitchen HUMALOG KWIKPEN 100 UNIT/ML KiwkPen ADMINISTER 5 UNITS UNDER THE SKIN THREE TIMES DAILY BEFORE MEALS 15 mL 12  . Insulin Glargine (LANTUS SOLOSTAR) 100 UNIT/ML Solostar  Pen Inject 37 Units into the skin daily. 15 mL 12  . Melatonin 10 MG CAPS Take 10 mg by mouth at bedtime as needed (sleep).     . metoprolol tartrate (LOPRESSOR) 25 MG tablet TAKE 1/2 TABLET BY MOUTH TWICE DAILY 90 tablet 3  . Multiple Vitamins-Minerals (MULTIVITAMINS THER. W/MINERALS) TABS Take 1 tablet by mouth daily.      . Omega-3 Fatty Acids (FISH OIL) 1000 MG CAPS Take 2,000 mg by mouth 2 (two) times daily.     Marland Kitchen OVER THE COUNTER MEDICATION Take 1 tablet by mouth 2 (two) times daily. Macular Degenerative Eye Supplement    . pantoprazole (PROTONIX) 40 MG tablet TAKE 1 TABLET BY MOUTH DAILY 90 tablet 3  . PARoxetine (PAXIL) 20 MG tablet TAKE 1 TABLET BY MOUTH EVERY MORNING 90 tablet 3  . traMADol (ULTRAM) 50 MG tablet TAKE 1 TABLET BY MOUTH FOUR TIMES DAILY 360 tablet 1   No current facility-administered medications for this encounter.     Allergies  Allergen Reactions  . Amoxicillin Rash    Has patient had a PCN reaction causing immediate rash, facial/tongue/throat swelling, SOB or lightheadedness with hypotension: No Has patient had a PCN reaction causing severe rash involving mucus membranes or skin necrosis: No Has patient had a PCN reaction that required hospitalization: No Has patient had a PCN reaction occurring within the last 10 years: No If all of the above answers are "NO", then may proceed with Cephalosporin use.   . Cephalexin Rash  . Codeine Phosphate Rash    Social History   Social History  . Marital status: Divorced    Spouse name: N/A  . Number of children: 2  . Years of education: 12   Occupational History  . DISABILITY Other    Worked at [employer] Topics  . Smoking status: Never Smoker  . Smokeless tobacco: Never Used  . Alcohol use No  . Drug use: No  . Sexual activity: Not Currently   Other Topics Concern  . Not on file   Social History Narrative   Health Care POA:    Emergency Contact: sister, [name] (h)  [phone]   End of Life Plan:    Who lives with you: female friend   Any pets: none   Diet: Pt has a varied diet of protein, starch, vegetables   Exercise: Pt does not have any regular exercise routine.   Seatbelts: Pt reports wearing seatbelt when in vehicles.    Sun Exposure/Protection: Pt reports using sun screen.   Hobbies: going to lake, biking, blue grass music          Family History  Problem Relation Age of Onset  . Heart disease Mother   . Heart attack Mother   . Pneumonia Father    ROS- All systems are reviewed and negative except as per the  HPI above.  Physical Exam: Vitals:   10/26/15 1140  BP: (!) 146/64  Pulse: 60  Weight: 190 lb (86.2 kg)  Height: 5' 5.5" (1.664 m)    GEN- The patient is elderly and obese appearing, alert and oriented x 3 today.   Head- normocephalic, atraumatic Eyes-  Sclera clear, conjunctiva pink Ears- hearing intact Oropharynx- clear Neck- supple  Lungs- Clear to ausculation bilaterally, normal work of breathing Heart- Regular rate and rhythm, no murmurs, rubs or gallops  GI- soft, NT, ND, + BS Extremities- no clubbing, cyanosis, or edema MS- no significant deformity or atrophy Skin- no rash or lesion Psych- euthymic mood, full affect Neuro- strength and sensation are intact  EKG today demonstrates sinus rhythm, rate 60, poor R wave progression  Echo 08/2015 demonstrated EF 55-60%, mild LVH, grade 1 diastolic dysfunction, calcified MV annulus, LA 37  Epic records are reviewed at length today  Assessment and Plan:  1. Paroxysmal atrial fibrillation The patient has asymptomatic paroxysmal atrial fibrillation identified on ILR as part of Stroke AF study.  She has been started on Eliquis without bleeding issues.  She does have chronic renal insufficiency and so will check BMET and CBC today Continue [place] long term for CHADS2VASC of 7  2. Chronic renal insufficiency, stage III Stable No change required today BMET today  3.   CAD s/p CABG No recent ischemic symptoms No ASA with OAC   4.  HTN Stable No change required today   Follow up in AF clinic in 3 months.   Chanetta Marshall, NP 10/26/2015 12:04 PM

## 2015-10-28 NOTE — Telephone Encounter (Signed)
Thanks

## 2015-10-29 ENCOUNTER — Telehealth: Payer: Self-pay | Admitting: Family Medicine

## 2015-10-29 DIAGNOSIS — I10 Essential (primary) hypertension: Secondary | ICD-10-CM

## 2015-10-29 DIAGNOSIS — I5022 Chronic systolic (congestive) heart failure: Secondary | ICD-10-CM

## 2015-10-29 MED ORDER — FUROSEMIDE 40 MG PO TABS: 40.0000 mg | ORAL_TABLET | Freq: Two times a day (BID) | ORAL | 3 refills | Status: DC

## 2015-10-29 NOTE — Assessment & Plan Note (Signed)
Home BP running high, weight is up four pounds and hand swelling in the morning - difficulty in getting rings off.  Increase lasix to bid.  FU with me next week.

## 2015-10-29 NOTE — Telephone Encounter (Signed)
Pt states BP is off the charts, but sometimes down. Hands are swollen in the morning. Pt is going out of [place] on the 14th. BCBS nurse told pt to call and let her PCP know. I scheduled appointment for next Wednesday. Please advise. Thanks! ep

## 2015-11-04 ENCOUNTER — Ambulatory Visit (INDEPENDENT_AMBULATORY_CARE_PROVIDER_SITE_OTHER): Payer: Medicare Other | Admitting: Family Medicine

## 2015-11-04 ENCOUNTER — Encounter: Payer: Self-pay | Admitting: Family Medicine

## 2015-11-04 DIAGNOSIS — I10 Essential (primary) hypertension: Secondary | ICD-10-CM | POA: Diagnosis not present

## 2015-11-04 DIAGNOSIS — N184 Chronic kidney disease, stage 4 (severe): Secondary | ICD-10-CM

## 2015-11-04 LAB — BASIC METABOLIC PANEL
BUN: 49 mg/dL — AB (ref 7–25)
CALCIUM: 8.7 mg/dL (ref 8.6–10.4)
CO2: 27 mmol/L (ref 20–31)
Chloride: 102 mmol/L (ref 98–110)
Creat: 2.89 mg/dL — ABNORMAL HIGH (ref 0.60–0.93)
GLUCOSE: 217 mg/dL — AB (ref 65–99)
POTASSIUM: 4.1 mmol/L (ref 3.5–5.3)
Sodium: 141 mmol/L (ref 135–146)

## 2015-11-04 NOTE — Assessment & Plan Note (Addendum)
Poor control.  Clinically volume overloaded and I lean toward increasing lasix.  First, I want to check creat. Creat OK, slight up.  Will try switch to once daily torsemide.  FU two weeks recheck BP and creat

## 2015-11-04 NOTE — Assessment & Plan Note (Signed)
Check creat 

## 2015-11-04 NOTE — Patient Instructions (Addendum)
Please make your annual wellness visit appointment on the way out with our nurse. I will check your creatinine today and call with results tomorrow.  I will make a decision about the lasix dose or change tomorrow.   Compression stockings are good.   See me once you get back from [place].   Have fun and try not to worry.  Life is a risk.  Take reasonable risks so that you can enjoy life.

## 2015-11-04 NOTE — Progress Notes (Signed)
Patient ID: Christine Santos, female   DOB: 23-May-1945, 69 y.o.   MRN: [id number]

## 2015-11-04 NOTE — Progress Notes (Signed)
   Subjective:    Patient ID: Christine Santos, female    DOB: 1945/03/07, 70 y.o.   MRN: [id number]  HPI Recheck CKD, HBP and leg swelling.  Left home BP and wt records at home.  Knows that legs are more swollen.  Interestingly, [place] wt =189 and she was considered dry.  Has had decreased appetite.  It is unclear to me what her dry wt is.  Watch lasix dose since creat bumped to 5 recently. Also on amlodipine which can worsen peripheral edema.  I cannot use ACE or ARB due to renal disease.  I cannot push Beta blocker because of bradycardia.  She has some urge incontinence, so alpha blocker would also be a bad choice.  I have limited options.   Planning trip to [place] next week.    Review of Systems     Objective:   Physical Exam Wt down 5lbs since last visit.  Rechecked BP and still up. Lungs clear Cardiac RRR with 2/6 SEM Ext 2+ bilateral edema.         Assessment & Plan:

## 2015-11-05 ENCOUNTER — Telehealth: Payer: Self-pay | Admitting: *Deleted

## 2015-11-05 MED ORDER — TORSEMIDE 20 MG PO TABS: 20.0000 mg | ORAL_TABLET | Freq: Every day | ORAL | 3 refills | Status: DC

## 2015-11-05 NOTE — Telephone Encounter (Signed)
Form completed.

## 2015-11-05 NOTE — Addendum Note (Signed)
Addended by: [name] on: 11/05/2015 10:39 AM   Modules accepted: Orders

## 2015-11-05 NOTE — Telephone Encounter (Signed)
Prior Authorization received from [place] for Torsemide 20 mg.  PA form placed in provider box for completion. Derl Barrow, RN

## 2015-11-05 NOTE — Telephone Encounter (Signed)
PA for torsemide 20 mg completed online at www.covermymeds.com. PA review could take 24-72 hours to complete.  Derl Barrow, RN

## 2015-11-06 NOTE — Telephone Encounter (Signed)
PA was approved for Torsemide 20 mg via [employer] of [place] [employer].  Approval valid 11/05/15-11/04/2016.  Derl Barrow, RN

## 2015-11-12 ENCOUNTER — Other Ambulatory Visit: Payer: Self-pay | Admitting: Family Medicine

## 2015-11-16 ENCOUNTER — Encounter: Payer: Self-pay | Admitting: Neurology

## 2015-11-16 ENCOUNTER — Ambulatory Visit (INDEPENDENT_AMBULATORY_CARE_PROVIDER_SITE_OTHER): Payer: Medicare Other | Admitting: Neurology

## 2015-11-16 VITALS — BP 160/80 | HR 54 | Wt 189.4 lb

## 2015-11-16 DIAGNOSIS — I480 Paroxysmal atrial fibrillation: Secondary | ICD-10-CM | POA: Diagnosis not present

## 2015-11-16 NOTE — Patient Instructions (Signed)
I had a long d/w patient and her sisterabout her recent lacunar stroke,new diagnosis of atrial fibrillation, risk for recurrent stroke/TIAs, personally independently reviewed imaging studies and stroke evaluation results and answered questions.Continue Eliquis (apixaban) daily  for secondary stroke prevention and maintain strict control of hypertension with blood pressure goal below 130/90, diabetes with hemoglobin A1c goal below 6.5% and lipids with LDL cholesterol goal below 70 mg/dL. I also advised the patient to eat a healthy diet with plenty of whole grains, cereals, fruits and vegetables, exercise regularly and maintain ideal body weight Followup in the future with my nurse practitioner in 6 months or call earlier if necessary.  Stroke Prevention Some medical conditions and behaviors are associated with an increased chance of having a stroke. You may prevent a stroke by making healthy choices and managing medical conditions. HOW CAN I REDUCE MY RISK OF HAVING A STROKE?   Stay physically active. Get at least 30 minutes of activity on most or all days.  Do not smoke. It may also be helpful to avoid exposure to secondhand smoke.  Limit alcohol use. Moderate alcohol use is considered to be:  No more than 2 drinks per day for men.  No more than 1 drink per day for nonpregnant women.  Eat healthy foods. This involves:  Eating 5 or more servings of fruits and vegetables a day.  Making dietary changes that address high blood pressure (hypertension), high cholesterol, diabetes, or obesity.  Manage your cholesterol levels.  Making food choices that are high in fiber and low in saturated fat, trans fat, and cholesterol may control cholesterol levels.  Take any prescribed medicines to control cholesterol as directed by your health care provider.  Manage your diabetes.  Controlling your carbohydrate and sugar intake is recommended to manage diabetes.  Take any prescribed medicines to control  diabetes as directed by your health care provider.  Control your hypertension.  Making food choices that are low in salt (sodium), saturated fat, trans fat, and cholesterol is recommended to manage hypertension.  Ask your health care provider if you need treatment to lower your blood pressure. Take any prescribed medicines to control hypertension as directed by your health care provider.  If you are 45-62 years of age, have your blood pressure checked every 3-5 years. If you are 79 years of age or older, have your blood pressure checked every year.  Maintain a healthy weight.  Reducing calorie intake and making food choices that are low in sodium, saturated fat, trans fat, and cholesterol are recommended to manage weight.  Stop drug abuse.  Avoid taking birth control pills.  Talk to your health care provider about the risks of taking birth control pills if you are over 67 years old, smoke, get migraines, or have ever had a blood clot.  Get evaluated for sleep disorders (sleep apnea).  Talk to your health care provider about getting a sleep evaluation if you snore a lot or have excessive sleepiness.  Take medicines only as directed by your health care provider.  For some people, aspirin or blood thinners (anticoagulants) are helpful in reducing the risk of forming abnormal blood clots that can lead to stroke. If you have the irregular heart rhythm of atrial fibrillation, you should be on a blood thinner unless there is a good reason you cannot take them.  Understand all your medicine instructions.  Make sure that other conditions (such as anemia or atherosclerosis) are addressed. SEEK IMMEDIATE MEDICAL CARE IF:   You have  sudden weakness or numbness of the face, arm, or leg, especially on one side of the body.  Your face or eyelid droops to one side.  You have sudden confusion.  You have trouble speaking (aphasia) or understanding.  You have sudden trouble seeing in one or  both eyes.  You have sudden trouble walking.  You have dizziness.  You have a loss of balance or coordination.  You have a sudden, severe headache with no known cause.  You have new chest pain or an irregular heartbeat. Any of these symptoms may represent a serious problem that is an emergency. Do not wait to see if the symptoms will go away. Get medical help at once. Call your local emergency services (911 in [place] Do not drive yourself to the hospital.   This information is not intended to replace advice given to you by your health care provider. Make sure you discuss any questions you have with your health care provider.   Document Released: 02/18/2004 Document Revised: 01/31/2014 Document Reviewed: 07/13/2012 Elsevier Interactive Patient Education [employer].

## 2015-11-16 NOTE — Progress Notes (Signed)
[facility] [address] [place]. [place] [address] [phone]       OFFICE FOLLOW-UP NOTE  Christine. MALENA TIMPONE Date of Birth:  1945/04/12 Medical Record Number:  [id number]   HPI: Christine Santos is a pleasant 70 year old Caucasian lady seen today for first office follow-up visit for hospital admission for stroke in August 2017. She is a complaint today by her sister.Xareni Kelch Rothis an 48 y.o.femalewith history of CAD (now on ASA, plavix), CLL, CHF, HTN, HLD, IDDM, GERD who presents to the ED for evaluation of blurred vision/double vision and dizziness. She states yesterday at 1000 she noted she felt dizzy but not vertigo. She thought it was her blood sugar and took a glucose pill. This persisted and at 1600 she noted horizontal diplopia and felt off balance due to her vision. Due to not returning to baseline she came to ED today. She had diplopia on upward and horizontal gaze and her husband states her left eye has a new ptosis. BP is 166/93 which was elevated more than normal. She stated she took her antiplatelets every day. In addition she was  bradycardic while in ED with pulse of 42. She was last known well 09/03/2015 at 10 AM. Patient was not administered IV t-PA secondary to being outside of the window. She was admitted for further evaluation and treatment. MRI scan of the brain showed left midbrain lacunar infarct due to small vessel disease. MRA of the brain showed no significant large vessel stenosis or occlusion. Carotid ultrasound showed no significant extracranial stenosis transversely echo showed normal ejection fraction. LDL cholesterol is mildly elevated 99 mg percent. Hemoglobin A1c in June 2017 was 6.8. Patient was enrolled into the stroke atrial fibrillation trial and was randomized into the loop recorder insertion. She was found to have paroxysmal atrial fibrillation in September 2017 and change from antiplatelet agent to eliquis. She is tolerating it well without  bleeding or bruising. She states her blood pressure is usually well controlled but today it is elevated at 160/80. Her fasting sugars are usually in the 90-100 range. She has no new complaints today. She does have lower extremity swelling and I recommend she discuss with her primary physician switching Norvasc to alternative blood pressure agent.  ROS:   14 system review of systems is positive for  weight gain, fatigue, murmur, leg swelling, cough, shortness of breath, increased thirst, decreased energy, change in appetite and all other systems negative PMH:  Past Medical History:  Diagnosis Date  . Anemia 08/29/2011  . Arthritis    "legs" (01/22/2013)  . CAD (coronary artery disease)    s/p CABG in 1999  . CLL (chronic lymphoblastic leukemia)   . Diastolic heart failure ([place])   . GERD (gastroesophageal reflux disease)   . H/O hiatal hernia   . Hyperlipidemia   . Hypertension   . IDDM (insulin dependent diabetes mellitus) ([place])   . PAD (peripheral artery disease) ([place])   . Paroxysmal atrial fibrillation ([facility])    a. identified on ILR as part of StrokeAF study  . Peripheral vascular disease ([place])   . Stroke (cerebrum) [facility])     Social History:  Social History   Social History  . Marital status: Divorced    Spouse name: N/A  . Number of children: 2  . Years of education: 12   Occupational History  . DISABILITY Other    Worked at [employer] Topics  . Smoking status: Never Smoker  .  Smokeless tobacco: Never Used  . Alcohol use No  . Drug use: No  . Sexual activity: Not Currently   Other Topics Concern  . Not on file   Social History Narrative   Health Care POA:    Emergency Contact: sister, [name] (h) [phone]   End of Life Plan:    Who lives with you: female friend   Any pets: none   Diet: Pt has a varied diet of protein, starch, vegetables   Exercise: Pt does not have any regular exercise routine.   Seatbelts: Pt reports wearing  seatbelt when in vehicles.    Sun Exposure/Protection: Pt reports using sun screen.   Hobbies: going to lake, biking, blue grass music          Medications:   Current Outpatient Prescriptions on File Prior to Visit  Medication Sig Dispense Refill  . allopurinol (ZYLOPRIM) 100 MG tablet Take 1 tablet (100 mg total) by mouth daily. 90 tablet 3  . amLODipine (NORVASC) 10 MG tablet TAKE 1 TABLET(10 MG) BY MOUTH DAILY 90 tablet 3  . apixaban (ELIQUIS) 5 MG TABS tablet Take 1 tablet (5 mg total) by mouth 2 (two) times daily. 60 tablet 11  . Ascorbic Acid (VITAMIN C) 100 MG tablet Take 100 mg by mouth daily.    Marland Kitchen atorvastatin (LIPITOR) 80 MG tablet TAKE 1 TABLET BY MOUTH DAILY 90 tablet 2  . carboxymethylcellulose (REFRESH PLUS) 0.5 % SOLN 1 drop 3 (three) times daily as needed.    . cloNIDine (CATAPRES) 0.3 MG tablet Take 1 tablet (0.3 mg total) by mouth 3 (three) times daily. 270 tablet 3  . fenofibrate (TRICOR) 48 MG tablet Take 1 tablet (48 mg total) by mouth daily. 30 tablet 4  . ferrous sulfate 325 (65 FE) MG tablet Take 1 tablet (325 mg total) by mouth daily with breakfast.  3  . fluticasone (FLONASE) 50 MCG/ACT nasal spray Place 2 sprays into both nostrils daily. 16 g 6  . GNP GARLIC EXTRACT PO Take 1 tablet by mouth daily.     Marland Kitchen HUMALOG KWIKPEN 100 UNIT/ML KiwkPen ADMINISTER 5 UNITS UNDER THE SKIN THREE TIMES DAILY BEFORE MEALS 15 mL 12  . Insulin Glargine (LANTUS SOLOSTAR) 100 UNIT/ML Solostar Pen Inject 37 Units into the skin daily. 15 mL 12  . Melatonin 10 MG CAPS Take 10 mg by mouth at bedtime as needed (sleep).     . metoprolol tartrate (LOPRESSOR) 25 MG tablet TAKE 1/2 TABLET BY MOUTH TWICE DAILY 90 tablet 3  . Multiple Vitamins-Minerals (MULTIVITAMINS THER. W/MINERALS) TABS Take 1 tablet by mouth daily.      . Omega-3 Fatty Acids (FISH OIL) 1000 MG CAPS Take 2,000 mg by mouth 2 (two) times daily.     Marland Kitchen OVER THE COUNTER MEDICATION Take 1 tablet by mouth 2 (two) times daily. Macular  Degenerative Eye Supplement    . pantoprazole (PROTONIX) 40 MG tablet TAKE 1 TABLET BY MOUTH DAILY 90 tablet 3  . PARoxetine (PAXIL) 20 MG tablet TAKE 1 TABLET BY MOUTH EVERY MORNING 90 tablet 3  . torsemide (DEMADEX) 20 MG tablet Take 1 tablet (20 mg total) by mouth daily. 90 tablet 3  . traMADol (ULTRAM) 50 MG tablet TAKE 1 TABLET BY MOUTH FOUR TIMES DAILY 360 tablet 1   No current facility-administered medications on file prior to visit.     Allergies:   Allergies  Allergen Reactions  . Amoxicillin Rash    Has patient had a PCN reaction  causing immediate rash, facial/tongue/throat swelling, SOB or lightheadedness with hypotension: No Has patient had a PCN reaction causing severe rash involving mucus membranes or skin necrosis: No Has patient had a PCN reaction that required hospitalization: No Has patient had a PCN reaction occurring within the last 10 years: No If all of the above answers are "NO", then may proceed with Cephalosporin use.   . Cephalexin Rash  . Codeine Phosphate Rash    Physical Exam General: well developed, well nourished elderly Caucasian lady, seated, in no evident distress Head: head normocephalic and atraumatic.  Neck: supple with no carotid or supraclavicular bruits Cardiovascular: regular rate and rhythm, no murmurs Musculoskeletal: no deformity Skin:  no rash/petichiae Vascular:  Normal pulses all extremities Vitals:   11/16/15 1430  BP: (!) 160/80  Pulse: (!) 54   Neurologic Exam Mental Status: Awake and fully alert. Oriented to place and time. Recent and remote memory intact. Attention span, concentration and fund of knowledge appropriate. Mood and affect appropriate.  Cranial Nerves: Fundoscopic exam reveals sharp disc margins. Pupils equal, briskly reactive to light. Extraocular movements full without nystagmus. Visual fields full to confrontation. Hearing intact. Facial sensation intact. Face, tongue, palate moves normally and symmetrically.    Motor: Normal bulk and tone. Normal strength in all tested extremity muscles. Sensory.: intact to touch ,pinprick .position and vibratory sensation.  Coordination: Rapid alternating movements normal in all extremities. Finger-to-nose and heel-to-shin performed accurately bilaterally. Gait and Station: Arises from chair without difficulty. Stance is normal. Gait demonstrates normal stride length and balance . Able to heel, toe and tandem walk with  difficulty.  Reflexes: 1+ and symmetric. Toes downgoing.   NIHSS  0 Modified Rankin  1  ASSESSMENT: 37 year Caucasian lady with small left midbrain lacunar infarct in August 2017 due to small vessel disease. She participated in the stroke atrial fibrillation trial and was randomized into the loop recorder insertion arm and was subsequently found to have paroxysmal atrial fibrillation. Multiple vascular risk factors of diabetes, hypertension, hyperlipidemia and paroxysmal to fibrillation.    PLAN: I had a long d/w patient and her sisterabout her recent lacunar stroke,new diagnosis of atrial fibrillation, risk for recurrent stroke/TIAs, personally independently reviewed imaging studies and stroke evaluation results and answered questions.Continue Eliquis (apixaban) daily  for secondary stroke prevention and maintain strict control of hypertension with blood pressure goal below 130/90, diabetes with hemoglobin A1c goal below 6.5% and lipids with LDL cholesterol goal below 70 mg/dL. I also advised the patient to eat a healthy diet with plenty of whole grains, cereals, fruits and vegetables, exercise regularly and maintain ideal body weight Followup in the future with my nurse practitioner in 6 months or call earlier if necessary.She does have lower extremity swelling and I recommend she discuss with her primary physician switching Norvasc to alternative blood pressure agent. Greater than 50% of time during this 25 minute visit was spent on  counseling,explanation of diagnosis, planning of further management, discussion with patient and family and coordination of care Antony Contras, MD  [facility] [address] [place], [place] [address]  Phone [phone] Fax [phone] Note: This document was prepared with digital dictation and possible smart phrase technology. Any transcriptional errors that result from this process are unintentional

## 2015-11-17 DIAGNOSIS — R490 Dysphonia: Secondary | ICD-10-CM | POA: Diagnosis not present

## 2015-11-17 DIAGNOSIS — R0982 Postnasal drip: Secondary | ICD-10-CM | POA: Diagnosis not present

## 2015-11-17 DIAGNOSIS — J00 Acute nasopharyngitis [common cold]: Secondary | ICD-10-CM | POA: Insufficient documentation

## 2015-11-19 ENCOUNTER — Ambulatory Visit (INDEPENDENT_AMBULATORY_CARE_PROVIDER_SITE_OTHER): Payer: Medicare Other | Admitting: Family Medicine

## 2015-11-19 ENCOUNTER — Encounter: Payer: Self-pay | Admitting: Family Medicine

## 2015-11-19 DIAGNOSIS — N184 Chronic kidney disease, stage 4 (severe): Secondary | ICD-10-CM | POA: Diagnosis not present

## 2015-11-19 DIAGNOSIS — I10 Essential (primary) hypertension: Secondary | ICD-10-CM | POA: Diagnosis not present

## 2015-11-19 NOTE — Assessment & Plan Note (Addendum)
Poor control.  Volume overload.  Needs further diuresis but want to check CKD first.  May challenge with ACE in the future.  Will check Creat and decide tomorrow.  Likely increase torsemide.   Creat stable.  Informed patient to increase torsemide to 40 mg daily.

## 2015-11-19 NOTE — Patient Instructions (Signed)
I will call tomorrow. See me in one week.   Depending on what I do tomorrow, you may need blood work on Monday or Tuesday.

## 2015-11-19 NOTE — Assessment & Plan Note (Signed)
Await Creat before med changes.  Call tomorrow.

## 2015-11-19 NOTE — Progress Notes (Signed)
   Subjective:    Patient ID: Christine Santos, female    DOB: May 08, 1945, 70 y.o.   MRN: [id number]  HPI Refractory hypertension plus CKD.  BP up despite switch to torsemide, which did not produce a vigorous diuresis.  Home BPs are also high (lower than today's office BP).  Leg swelling no better, perhaps worse.  Feels generally tired.   No chest pain or DOE    Review of Systems     Objective:   Physical Exam Lungs clear.   Cardiac RRR without m or g Ext 2-3+ edema.        Assessment & Plan:

## 2015-11-20 LAB — BASIC METABOLIC PANEL WITH GFR
BUN: 49 mg/dL — AB (ref 7–25)
CALCIUM: 9 mg/dL (ref 8.6–10.4)
CO2: 26 mmol/L (ref 20–31)
CREATININE: 2.89 mg/dL — AB (ref 0.60–0.93)
Chloride: 108 mmol/L (ref 98–110)
GFR, Est African American: 18 mL/min — ABNORMAL LOW (ref >=60)
GFR, Est Non African American: 16 mL/min — ABNORMAL LOW (ref >=60)
Glucose, Bld: 129 mg/dL — ABNORMAL HIGH (ref 65–99)
Potassium: 4.4 mmol/L (ref 3.5–5.3)
SODIUM: 143 mmol/L (ref 135–146)

## 2015-11-20 MED ORDER — TORSEMIDE 20 MG PO TABS: 40.0000 mg | ORAL_TABLET | Freq: Every day | ORAL | 3 refills | Status: DC

## 2015-11-20 NOTE — Addendum Note (Signed)
Addended by: [name] on: 11/20/2015 09:35 AM   Modules accepted: Orders

## 2015-11-25 ENCOUNTER — Other Ambulatory Visit: Payer: Medicare Other

## 2015-11-25 ENCOUNTER — Ambulatory Visit (INDEPENDENT_AMBULATORY_CARE_PROVIDER_SITE_OTHER): Payer: Medicare Other | Admitting: *Deleted

## 2015-11-25 ENCOUNTER — Encounter: Payer: Self-pay | Admitting: *Deleted

## 2015-11-25 VITALS — BP 150/58 | HR 50 | Temp 97.9°F | Ht 65.5 in | Wt 190.4 lb

## 2015-11-25 DIAGNOSIS — N184 Chronic kidney disease, stage 4 (severe): Secondary | ICD-10-CM | POA: Diagnosis not present

## 2015-11-25 DIAGNOSIS — I10 Essential (primary) hypertension: Secondary | ICD-10-CM | POA: Diagnosis not present

## 2015-11-25 DIAGNOSIS — Z0000 Encounter for general adult medical examination without abnormal findings: Secondary | ICD-10-CM

## 2015-11-25 DIAGNOSIS — I5022 Chronic systolic (congestive) heart failure: Secondary | ICD-10-CM

## 2015-11-25 LAB — BASIC METABOLIC PANEL WITH GFR
BUN: 46 mg/dL — ABNORMAL HIGH (ref 7–25)
CO2: 27 mmol/L (ref 20–31)
Calcium: 9.3 mg/dL (ref 8.6–10.4)
Chloride: 104 mmol/L (ref 98–110)
Creat: 3.29 mg/dL — ABNORMAL HIGH (ref 0.60–0.93)
GFR, EST NON AFRICAN AMERICAN: 14 mL/min — AB (ref >=60)
GFR, Est African American: 16 mL/min — ABNORMAL LOW (ref >=60)
GLUCOSE: 63 mg/dL — AB (ref 65–99)
POTASSIUM: 4.6 mmol/L (ref 3.5–5.3)
Sodium: 143 mmol/L (ref 135–146)

## 2015-11-25 NOTE — Patient Instructions (Signed)
Diabetes and Foot Care Diabetes may cause you to have problems because of poor blood supply (circulation) to your feet and legs. This may cause the skin on your feet to become thinner, break easier, and heal more slowly. Your skin may become dry, and the skin may peel and crack. You may also have nerve damage in your legs and feet causing decreased feeling in them. You may not notice minor injuries to your feet that could lead to infections or more serious problems. Taking care of your feet is one of the most important things you can do for yourself.  HOME CARE INSTRUCTIONS  Wear shoes at all times, even in the house. Do not go barefoot. Bare feet are easily injured.  Check your feet daily for blisters, cuts, and redness. If you cannot see the bottom of your feet, use a mirror or ask someone for help.  Wash your feet with warm water (do not use hot water) and mild soap. Then pat your feet and the areas between your toes until they are completely dry. Do not soak your feet as this can dry your skin.  Apply a moisturizing lotion or petroleum jelly (that does not contain alcohol and is unscented) to the skin on your feet and to dry, brittle toenails. Do not apply lotion between your toes.  Trim your toenails straight across. Do not dig under them or around the cuticle. File the edges of your nails with an emery board or nail file.  Do not cut corns or calluses or try to remove them with medicine.  Wear clean socks or stockings every day. Make sure they are not too tight. Do not wear knee-high stockings since they may decrease blood flow to your legs.  Wear shoes that fit properly and have enough cushioning. To break in new shoes, wear them for just a few hours a day. This prevents you from injuring your feet. Always look in your shoes before you put them on to be sure there are no objects inside.  Do not cross your legs. This may decrease the blood flow to your feet.  If you find a minor scrape,  cut, or break in the skin on your feet, keep it and the skin around it clean and dry. These areas may be cleansed with mild soap and water. Do not cleanse the area with peroxide, alcohol, or iodine.  When you remove an adhesive bandage, be sure not to damage the skin around it.  If you have a wound, look at it several times a day to make sure it is healing.  Do not use heating pads or hot water bottles. They may burn your skin. If you have lost feeling in your feet or legs, you may not know it is happening until it is too late.  Make sure your health care provider performs a complete foot exam at least annually or more often if you have foot problems. Report any cuts, sores, or bruises to your health care provider immediately. SEEK MEDICAL CARE IF:   You have an injury that is not healing.  You have cuts or breaks in the skin.  You have an ingrown nail.  You notice redness on your legs or feet.  You feel burning or tingling in your legs or feet.  You have pain or cramps in your legs and feet.  Your legs or feet are numb.  Your feet always feel cold. SEEK IMMEDIATE MEDICAL CARE IF:   There is increasing redness,  swelling, or pain in or around a wound.  There is a red line that goes up your leg.  Pus is coming from a wound.  You develop a fever or as directed by your health care provider.  You notice a bad smell coming from an ulcer or wound.   This information is not intended to replace advice given to you by your health care provider. Make sure you discuss any questions you have with your health care provider.   Document Released: 01/08/2000 Document Revised: 09/12/2012 Document Reviewed: 06/19/2012 Elsevier Interactive Patient Education 2016 [place] in the Home  Falls can cause injuries. They can happen to people of all ages. There are many things you can do to make your home safe and to help prevent falls.  WHAT CAN I DO ON THE OUTSIDE OF MY  HOME?  Regularly fix the edges of walkways and driveways and fix any cracks.  Remove anything that might make you trip as you walk through a door, such as a raised step or threshold.  Trim any bushes or trees on the path to your home.  Use bright outdoor lighting.  Clear any walking paths of anything that might make someone trip, such as rocks or tools.  Regularly check to see if handrails are loose or broken. Make sure that both sides of any steps have handrails.  Any raised decks and porches should have guardrails on the edges.  Have any leaves, snow, or ice cleared regularly.  Use sand or salt on walking paths during winter.  Clean up any spills in your garage right away. This includes oil or grease spills. WHAT CAN I DO IN THE BATHROOM?   Use night lights.  Install grab bars by the toilet and in the tub and shower. Do not use towel bars as grab bars.  Use non-skid mats or decals in the tub or shower.  If you need to sit down in the shower, use a plastic, non-slip stool.  Keep the floor dry. Clean up any water that spills on the floor as soon as it happens.  Remove soap buildup in the tub or shower regularly.  Attach bath mats securely with double-sided non-slip rug tape.  Do not have throw rugs and other things on the floor that can make you trip. WHAT CAN I DO IN THE BEDROOM?  Use night lights.  Make sure that you have a light by your bed that is easy to reach.  Do not use any sheets or blankets that are too big for your bed. They should not hang down onto the floor.  Have a firm chair that has side arms. You can use this for support while you get dressed.  Do not have throw rugs and other things on the floor that can make you trip. WHAT CAN I DO IN THE KITCHEN?  Clean up any spills right away.  Avoid walking on wet floors.  Keep items that you use a lot in easy-to-reach places.  If you need to reach something above you, use a strong step stool that has a  grab bar.  Keep electrical cords out of the way.  Do not use floor polish or wax that makes floors slippery. If you must use wax, use non-skid floor wax.  Do not have throw rugs and other things on the floor that can make you trip. WHAT CAN I DO WITH MY STAIRS?  Do not leave any items on the stairs.  Make sure that there are handrails on both sides of the stairs and use them. Fix handrails that are broken or loose. Make sure that handrails are as long as the stairways.  Check any carpeting to make sure that it is firmly attached to the stairs. Fix any carpet that is loose or worn.  Avoid having throw rugs at the top or bottom of the stairs. If you do have throw rugs, attach them to the floor with carpet tape.  Make sure that you have a light switch at the top of the stairs and the bottom of the stairs. If you do not have them, ask someone to add them for you. WHAT ELSE CAN I DO TO HELP PREVENT FALLS?  Wear shoes that:  Do not have high heels.  Have rubber bottoms.  Are comfortable and fit you well.  Are closed at the toe. Do not wear sandals.  If you use a stepladder:  Make sure that it is fully opened. Do not climb a closed stepladder.  Make sure that both sides of the stepladder are locked into place.  Ask someone to hold it for you, if possible.  Clearly mark and make sure that you can see:  Any grab bars or handrails.  First and last steps.  Where the edge of each step is.  Use tools that help you move around (mobility aids) if they are needed. These include:  Canes.  Walkers.  Scooters.  Crutches.  Turn on the lights when you go into a dark area. Replace any light bulbs as soon as they burn out.  Set up your furniture so you have a clear path. Avoid moving your furniture around.  If any of your floors are uneven, fix them.  If there are any pets around you, be aware of where they are.  Review your medicines with your doctor. Some medicines can make  you feel dizzy. This can increase your chance of falling. Ask your doctor what other things that you can do to help prevent falls.   This information is not intended to replace advice given to you by your health care provider. Make sure you discuss any questions you have with your health care provider.   Document Released: 11/06/2008 Document Revised: 05/27/2014 Document Reviewed: 02/14/2014 Elsevier Interactive Patient Education 2016 [place] Maintenance, Female Adopting a healthy lifestyle and getting preventive care can go a long way to promote health and wellness. Talk with your health care provider about what schedule of regular examinations is right for you. This is a good chance for you to check in with your provider about disease prevention and staying healthy. In between checkups, there are plenty of things you can do on your own. Experts have done a lot of research about which lifestyle changes and preventive measures are most likely to keep you healthy. Ask your health care provider for more information. WEIGHT AND DIET  Eat a healthy diet  Be sure to include plenty of vegetables, fruits, low-fat dairy products, and lean protein.  Do not eat a lot of foods high in solid fats, added sugars, or salt.  Get regular exercise. This is one of the most important things you can do for your health.  Most adults should exercise for at least 150 minutes each week. The exercise should increase your heart rate and make you sweat (moderate-intensity exercise).  Most adults should also do strengthening exercises at least twice a week. This is in addition to the  moderate-intensity exercise.  Maintain a healthy weight  Body mass index (BMI) is a measurement that can be used to identify possible weight problems. It estimates body fat based on height and weight. Your health care provider can help determine your BMI and help you achieve or maintain a healthy weight.  For females 68  years of age and older:   A BMI below 18.5 is considered underweight.  A BMI of 18.5 to 24.9 is normal.  A BMI of 25 to 29.9 is considered overweight.  A BMI of 30 and above is considered obese.  Watch levels of cholesterol and blood lipids  You should start having your blood tested for lipids and cholesterol at 71 years of age, then have this test every 5 years.  You may need to have your cholesterol levels checked more often if:  Your lipid or cholesterol levels are high.  You are older than 71 years of age.  You are at high risk for heart disease.  CANCER SCREENING   Lung Cancer  Lung cancer screening is recommended for adults 43-29 years old who are at high risk for lung cancer because of a history of smoking.  A yearly low-dose CT scan of the lungs is recommended for people who:  Currently smoke.  Have quit within the past 15 years.  Have at least a 30-pack-year history of smoking. A pack year is smoking an average of one pack of cigarettes a day for 1 year.  Yearly screening should continue until it has been 15 years since you quit.  Yearly screening should stop if you develop a health problem that would prevent you from having lung cancer treatment.  Breast Cancer  Practice breast self-awareness. This means understanding how your breasts normally appear and feel.  It also means doing regular breast self-exams. Let your health care provider know about any changes, no matter how small.  If you are in your 20s or 30s, you should have a clinical breast exam (CBE) by a health care provider every 1-3 years as part of a regular health exam.  If you are 61 or older, have a CBE every year. Also consider having a breast X-ray (mammogram) every year.  If you have a family history of breast cancer, talk to your health care provider about genetic screening.  If you are at high risk for breast cancer, talk to your health care provider about having an MRI and a mammogram  every year.  Breast cancer gene (BRCA) assessment is recommended for women who have family members with BRCA-related cancers. BRCA-related cancers include:  Breast.  Ovarian.  Tubal.  Peritoneal cancers.  Results of the assessment will determine the need for genetic counseling and BRCA1 and BRCA2 testing. Cervical Cancer Your health care provider may recommend that you be screened regularly for cancer of the pelvic organs (ovaries, uterus, and vagina). This screening involves a pelvic examination, including checking for microscopic changes to the surface of your cervix (Pap test). You may be encouraged to have this screening done every 3 years, beginning at age 48.  For women ages 5-65, health care providers may recommend pelvic exams and Pap testing every 3 years, or they may recommend the Pap and pelvic exam, combined with testing for human papilloma virus (HPV), every 5 years. Some types of HPV increase your risk of cervical cancer. Testing for HPV may also be done on women of any age with unclear Pap test results.  Other health care providers may not recommend  any screening for nonpregnant women who are considered low risk for pelvic cancer and who do not have symptoms. Ask your health care provider if a screening pelvic exam is right for you.  If you have had past treatment for cervical cancer or a condition that could lead to cancer, you need Pap tests and screening for cancer for at least 20 years after your treatment. If Pap tests have been discontinued, your risk factors (such as having a new sexual partner) need to be reassessed to determine if screening should resume. Some women have medical problems that increase the chance of getting cervical cancer. In these cases, your health care provider may recommend more frequent screening and Pap tests. Colorectal Cancer  This type of cancer can be detected and often prevented.  Routine colorectal cancer screening usually begins at 71  years of age and continues through 71 years of age.  Your health care provider may recommend screening at an earlier age if you have risk factors for colon cancer.  Your health care provider may also recommend using home test kits to check for hidden blood in the stool.  A small camera at the end of a tube can be used to examine your colon directly (sigmoidoscopy or colonoscopy). This is done to check for the earliest forms of colorectal cancer.  Routine screening usually begins at age 20.  Direct examination of the colon should be repeated every 5-10 years through 71 years of age. However, you may need to be screened more often if early forms of precancerous polyps or small growths are found. Skin Cancer  Check your skin from head to toe regularly.  Tell your health care provider about any new moles or changes in moles, especially if there is a change in a mole's shape or color.  Also tell your health care provider if you have a mole that is larger than the size of a pencil eraser.  Always use sunscreen. Apply sunscreen liberally and repeatedly throughout the day.  Protect yourself by wearing long sleeves, pants, a wide-brimmed hat, and sunglasses whenever you are outside. HEART DISEASE, DIABETES, AND HIGH BLOOD PRESSURE   High blood pressure causes heart disease and increases the risk of stroke. High blood pressure is more likely to develop in:  People who have blood pressure in the high end of the normal range (130-139/85-89 mm Hg).  People who are overweight or obese.  People who are African American.  If you are 45-64 years of age, have your blood pressure checked every 3-5 years. If you are 75 years of age or older, have your blood pressure checked every year. You should have your blood pressure measured twice--once when you are at a hospital or clinic, and once when you are not at a hospital or clinic. Record the average of the two measurements. To check your blood pressure  when you are not at a hospital or clinic, you can use:  An automated blood pressure machine at a pharmacy.  A home blood pressure monitor.  If you are between 51 years and 35 years old, ask your health care provider if you should take aspirin to prevent strokes.  Have regular diabetes screenings. This involves taking a blood sample to check your fasting blood sugar level.  If you are at a normal weight and have a low risk for diabetes, have this test once every three years after 71 years of age.  If you are overweight and have a high risk for diabetes,  consider being tested at a younger age or more often. PREVENTING INFECTION  Hepatitis B  If you have a higher risk for hepatitis B, you should be screened for this virus. You are considered at high risk for hepatitis B if:  You were born in a country where hepatitis B is common. Ask your health care provider which countries are considered high risk.  Your parents were born in a high-risk country, and you have not been immunized against hepatitis B (hepatitis B vaccine).  You have HIV or AIDS.  You use needles to inject street drugs.  You live with someone who has hepatitis B.  You have had sex with someone who has hepatitis B.  You get hemodialysis treatment.  You take certain medicines for conditions, including cancer, organ transplantation, and autoimmune conditions. Hepatitis C  Blood testing is recommended for:  Everyone born from [birth year] through 1966.  Anyone with known risk factors for hepatitis C. Sexually transmitted infections (STIs)  You should be screened for sexually transmitted infections (STIs) including gonorrhea and chlamydia if:  You are sexually active and are younger than 71 years of age.  You are older than 71 years of age and your health care provider tells you that you are at risk for this type of infection.  Your sexual activity has changed since you were last screened and you are at an increased risk  for chlamydia or gonorrhea. Ask your health care provider if you are at risk.  If you do not have HIV, but are at risk, it may be recommended that you take a prescription medicine daily to prevent HIV infection. This is called pre-exposure prophylaxis (PrEP). You are considered at risk if:  You are sexually active and do not regularly use condoms or know the HIV status of your partner(s).  You take drugs by injection.  You are sexually active with a partner who has HIV. Talk with your health care provider about whether you are at high risk of being infected with HIV. If you choose to begin PrEP, you should first be tested for HIV. You should then be tested every 3 months for as long as you are taking PrEP.  PREGNANCY   If you are premenopausal and you may become pregnant, ask your health care provider about preconception counseling.  If you may become pregnant, take 400 to 800 micrograms (mcg) of folic acid every day.  If you want to prevent pregnancy, talk to your health care provider about birth control (contraception). OSTEOPOROSIS AND MENOPAUSE   Osteoporosis is a disease in which the bones lose minerals and strength with aging. This can result in serious bone fractures. Your risk for osteoporosis can be identified using a bone density scan.  If you are 71 years of age or older, or if you are at risk for osteoporosis and fractures, ask your health care provider if you should be screened.  Ask your health care provider whether you should take a calcium or vitamin D supplement to lower your risk for osteoporosis.  Menopause may have certain physical symptoms and risks.  Hormone replacement therapy may reduce some of these symptoms and risks. Talk to your health care provider about whether hormone replacement therapy is right for you.  HOME CARE INSTRUCTIONS   Schedule regular health, dental, and eye exams.  Stay current with your immunizations.   Do not use any tobacco products  including cigarettes, chewing tobacco, or electronic cigarettes.  If you are pregnant, do not drink alcohol.  If you are breastfeeding, limit how much and how often you drink alcohol.  Limit alcohol intake to no more than 1 drink per day for nonpregnant women. One drink equals 12 ounces of beer, 5 ounces of wine, or 1 ounces of hard liquor.  Do not use street drugs.  Do not share needles.  Ask your health care provider for help if you need support or information about quitting drugs.  Tell your health care provider if you often feel depressed.  Tell your health care provider if you have ever been abused or do not feel safe at home.   This information is not intended to replace advice given to you by your health care provider. Make sure you discuss any questions you have with your health care provider.   Document Released: 07/26/2010 Document Revised: 01/31/2014 Document Reviewed: 12/12/2012 Elsevier Interactive Patient Education [employer].

## 2015-11-25 NOTE — Progress Notes (Signed)
I have reviewed this visit and discussed with Howell Rucks, RN, BSN, and agree with her documentation.   Concern about gait noted and will be addressed next visit.

## 2015-11-25 NOTE — Progress Notes (Signed)
Subjective:   Christine Santos is a 70 y.o. female who presents with her sister for Medicare Annual (Subsequent) preventive examination.  Cardiac Risk Factors include: advanced age (>16men, >39 women);diabetes mellitus;dyslipidemia;hypertension;obesity (BMI >30kg/m2);sedentary lifestyle     Objective:     Vitals: BP (!) 150/58 (BP Location: Left Arm, Cuff Size: Normal)   Pulse (!) 50   Temp 97.9 F (36.6 C) (Oral)   Ht 5' 5.5" (1.664 m)   Wt 190 lb 6.4 oz (86.4 kg)   SpO2 94%   BMI 31.20 kg/m   Body mass index is 31.2 kg/m. Initial BP 158/50 recheck 150/58  left arm manually with adult cuff, 2 hours after patient took BP meds. Patient has home BP monitoring device Patient states she checked her BP this morning and systolic was 009. This was prior to taking her BP meds. Patient instructed to take BP 2 hours after taking AM BP meds and to wait 5 minutes with legs uncrossed before taking BP. States understanding.  Tobacco History  Smoking Status  . Never Smoker  Smokeless Tobacco  . Never Used     Counseling given: No Patient has never smoked and has no plans to start.   Past Medical History:  Diagnosis Date  . Anemia 08/29/2011  . Arthritis    "legs" (01/22/2013)  . CAD (coronary artery disease)    s/p CABG in 1999  . CLL (chronic lymphoblastic leukemia)   . Diastolic heart failure ([place])   . GERD (gastroesophageal reflux disease)   . H/O hiatal hernia   . Hyperlipidemia   . Hypertension   . IDDM (insulin dependent diabetes mellitus) ([place])   . PAD (peripheral artery disease) ([place])   . Paroxysmal atrial fibrillation (HCC)    a. identified on ILR as part of StrokeAF study  . Peripheral vascular disease ([place])   . Stroke (cerebrum) [facility])    Patient reports one CBG < 70 in past month (67). Patient states she knew to eat a meal. Discussed  immediate actions to take whenever BS is < 70. Patient records BS and brings log to every visit for review.   Past Surgical  History:  Procedure Laterality Date  . ANGIOPLASTY / STENTING FEMORAL Right 09/2010   SFA/notes 11/25/2010 (01/22/2013)  . ANGIOPLASTY / STENTING FEMORAL Left 11/2010   SFA/notes 11/25/2010 (01/22/2013)  . ANGIOPLASTY / STENTING ILIAC     [name] 11/25/2010 (01/22/2013)  . CATARACT EXTRACTION W/ INTRAOCULAR LENS  IMPLANT, BILATERAL Bilateral ?2011  . CHOLECYSTECTOMY  1993  . CORONARY ARTERY BYPASS GRAFT  10/1994   "CABG X3"  . EP IMPLANTABLE DEVICE N/A 09/11/2015   Procedure: Loop Recorder Insertion;  Surgeon: Thompson Grayer, MD;  Location: [place] [facility];  Service: Cardiovascular;  Laterality: N/A;  . HEEL SPUR EXCISION Bilateral 1970's  . LOWER EXTREMITY ANGIOGRAM N/A 01/22/2013   Procedure: LOWER EXTREMITY ANGIOGRAM;  Surgeon: [name], MD;  Location: [facility] CATH LAB;  Service: Cardiovascular;  Laterality: N/A;  . SHOULDER OPEN ROTATOR CUFF REPAIR Bilateral 1990's   "2 times on 1 side; once on the other"  . TRANSLUMINAL ATHERECTOMY FEMORAL ARTERY Right 01/22/2013   & balloon  . TUBAL LIGATION  1980's   Family History  Problem Relation Age of Onset  . Heart disease Mother   . Heart attack Mother   . Hypertension Mother   . Pneumonia Father   . Heart attack Father   . Diabetes Sister   . Arthritis Brother   .  Diabetes Brother   . Heart attack Sister   . Obesity Sister   . Hypertension Sister   . Heart attack Sister   . Cancer Sister     breast  . Hyperlipidemia Sister   . Hypertension Sister   . Hyperlipidemia Sister    History  Sexual Activity  . Sexual activity: Not Currently    Outpatient Encounter Prescriptions as of 11/25/2015  Medication Sig  . allopurinol (ZYLOPRIM) 100 MG tablet Take 1 tablet (100 mg total) by mouth daily.  Marland Kitchen amLODipine (NORVASC) 10 MG tablet TAKE 1 TABLET(10 MG) BY MOUTH DAILY  . apixaban (ELIQUIS) 5 MG TABS tablet Take 1 tablet (5 mg total) by mouth 2 (two) times daily.  . Ascorbic Acid (VITAMIN C) 100 MG tablet Take 100 mg by mouth  daily.  Marland Kitchen atorvastatin (LIPITOR) 80 MG tablet TAKE 1 TABLET BY MOUTH DAILY  . carboxymethylcellulose (REFRESH PLUS) 0.5 % SOLN 1 drop 3 (three) times daily as needed.  . cloNIDine (CATAPRES) 0.3 MG tablet Take 1 tablet (0.3 mg total) by mouth 3 (three) times daily.  . fenofibrate (TRICOR) 48 MG tablet Take 1 tablet (48 mg total) by mouth daily.  . ferrous sulfate 325 (65 FE) MG tablet Take 1 tablet (325 mg total) by mouth daily with breakfast.  . fluticasone (FLONASE) 50 MCG/ACT nasal spray Place 2 sprays into both nostrils daily.  Marland Kitchen GNP GARLIC EXTRACT PO Take 1 tablet by mouth daily.   Marland Kitchen HUMALOG KWIKPEN 100 UNIT/ML KiwkPen ADMINISTER 5 UNITS UNDER THE SKIN THREE TIMES DAILY BEFORE MEALS  . Insulin Glargine (LANTUS SOLOSTAR) 100 UNIT/ML Solostar Pen Inject 37 Units into the skin daily.  . Melatonin 10 MG CAPS Take 10 mg by mouth at bedtime as needed (sleep).   . metoprolol tartrate (LOPRESSOR) 25 MG tablet TAKE 1/2 TABLET BY MOUTH TWICE DAILY  . Multiple Vitamins-Minerals (MULTIVITAMINS THER. W/MINERALS) TABS Take 1 tablet by mouth daily.    . Omega-3 Fatty Acids (FISH OIL) 1000 MG CAPS Take 2,000 mg by mouth 2 (two) times daily.   Marland Kitchen OVER THE COUNTER MEDICATION Take 1 tablet by mouth 2 (two) times daily. Macular Degenerative Eye Supplement  . pantoprazole (PROTONIX) 40 MG tablet TAKE 1 TABLET BY MOUTH DAILY  . PARoxetine (PAXIL) 20 MG tablet TAKE 1 TABLET BY MOUTH EVERY MORNING  . torsemide (DEMADEX) 20 MG tablet Take 2 tablets (40 mg total) by mouth daily.  . traMADol (ULTRAM) 50 MG tablet TAKE 1 TABLET BY MOUTH FOUR TIMES DAILY  . insulin lispro (HUMALOG KWIKPEN) 100 UNIT/ML KiwkPen ADM 5 UNITS [place] TID B MEALS  . omega-3 acid ethyl esters (LOVAZA) 1 g capsule Take by mouth.   No facility-administered encounter medications on file as of 11/25/2015.     Activities of Daily Living In your present state of health, do you have any difficulty performing the following activities: 11/25/2015  09/11/2015  Hearing? N N  Vision? N N  Difficulty concentrating or making decisions? N N  Walking or climbing stairs? Y Y  Dressing or bathing? N N  Doing errands, shopping? N -  Preparing Food and eating ? N -  Using the Toilet? N -  In the past six months, have you accidently leaked urine? Y -  Do you have problems with loss of bowel control? N -  Managing your Medications? N -  Managing your Finances? N -  Housekeeping or managing your Housekeeping? N -  Some recent data might be hidden  Home  Safety:  My home has a working smoke alarm:  Yes X 2           My home throw rugs have been fastened down to the floor or removed:  Fastened down I have non-slip mats in the bathtub and shower:  Yes as well as grab bar and shower chair         All my home's stairs have railings or bannisters: One level home with 2 outside stairs with railings           My home's floors, stairs and hallways are free from clutter, wires and cords:  Yes     I wear seatbelts consistently:  Yes    Patient Care Team: [name], MD as PCP - General Monna Fam, MD (Ophthalmology) [name], MD as Consulting Physician (Cardiology) Hurman Horn, MD (Ophthalmology) Heath Lark, MD as Consulting Physician (Hematology and Oncology) Estanislado Emms, MD as Consulting Physician (Nephrology) Kandra Nicolas Randal Buba, RN as [place] [facility] Laurence Spates, MD as Consulting Physician (Gastroenterology)    Assessment:     Exercise Activities and Dietary recommendations Current Exercise Habits: Home exercise routine, Exercise limited by: orthopedic condition(s)  Goals    . Blood Pressure < 150/90    . Exercise 1x per week (15 min per time)     Patient is going to look into Silver Sneakers at [employer]. [facility] and is willing to take patient and patient's significant other with her to exercise. Fall Risk Fall Risk  11/25/2015 11/19/2015 11/16/2015  11/04/2015 09/30/2015  Falls in the past year? No No No No No  Risk for fall due to : Impaired balance/gait;Impaired mobility - - - -   TUG Test:  Done in 10 seconds. Patient used both hands to push out of chair and to sit back down. Patient walked with cane but picked cane up and held it when turning around. Patient is wearing diabetic shoes and states she wears these at all times including inside the home. Falls prevention discussed and literature given. PCP notified of fall risk status.  Cognitive Function: Mini-Cog  Passed with score 3/5  Depression Screen PHQ 2/9 Scores 11/25/2015 11/19/2015 11/04/2015 10/14/2015  PHQ - 2 Score 0 0 0 0     Cognitive Function MMSE - Mini Mental State Exam 03/29/2011  Orientation to time 5  Orientation to Place 5  Registration 3  Attention/ Calculation 4  Recall 2  Language- name 2 objects 2  Language- repeat 1  Language- follow 3 step command 3  Language- read & follow direction 1  Write a sentence 1  Copy design 1  Total score 28        Immunization History  Administered Date(s) Administered  . Influenza Split 11/10/2010, 10/19/2011  . Influenza Whole 10/13/2006, 10/30/2007, 10/31/2007, 11/17/2008, 10/21/2009  . Influenza,inj,Quad PF,36+ Mos 10/30/2012, 11/01/2013, 10/23/2014, 09/30/2015  . Pneumococcal Conjugate-13 07/18/2014  . Pneumococcal Polysaccharide-23 10/24/2000, 09/21/2011  . Td 11/24/1997, 12/21/2005  . Zoster 11/16/2006   Screening Tests Health Maintenance  Topic Date Due  . TETANUS/TDAP  12/22/2015  . HEMOGLOBIN A1C  03/06/2016  . FOOT EXAM  03/18/2016  . OPHTHALMOLOGY EXAM  09/17/2016  . MAMMOGRAM  10/29/2016  . COLONOSCOPY  07/20/2022  . INFLUENZA VACCINE  Completed  . DEXA SCAN  Completed  . ZOSTAVAX  Completed  . Hepatitis C Screening  Completed  . PNA vac Low Risk Adult  Completed  Patient will  be due for TDaP at end of Nov 2017. Patient will obtain TDaP at local pharmacy. [facility]; contact info given.       Plan:   Patient and sister concerned with LE edema (2-3+ pitting edema) and lack of weight loss (1 lb in 6 days) after starting torsemide. Checked with PCP and BMP with GFR ordered. PCP came into room to talk with patient and check LEs.  During the course of the visit the patient was educated and counseled about the following appropriate screening and preventive services:   Vaccines to include Pneumoccal, Influenza, Td, Zostavax  Cardiovascular Disease  Colorectal cancer screening  Bone density screening  Diabetes screening  Mammography/PAP  Nutrition counseling   Patient Instructions (the written plan) was given to the patient.   Velora Heckler, RN  11/25/2015

## 2015-11-26 MED ORDER — TORSEMIDE 20 MG PO TABS: 60.0000 mg | ORAL_TABLET | Freq: Every day | ORAL | 3 refills | Status: DC

## 2015-11-26 NOTE — Addendum Note (Signed)
Addended by: [name] on: 11/26/2015 02:27 PM   Modules accepted: Orders

## 2015-11-26 NOTE — Assessment & Plan Note (Signed)
Mild bump in creat.  Still volume overloaded.  Called and informed to increase torsemide to 60 mg daily and see me in one week.  Make an appointment with Dr. [name] for additional opinion.

## 2015-11-29 ENCOUNTER — Ambulatory Visit (HOSPITAL_COMMUNITY)
Admission: EM | Admit: 2015-11-29 | Discharge: 2015-11-29 | Disposition: A | Discharge status: Home / Self Care | Payer: Medicare Other | Attending: Emergency Medicine | Admitting: Emergency Medicine

## 2015-11-29 ENCOUNTER — Encounter (HOSPITAL_COMMUNITY): Payer: Self-pay | Admitting: Family Medicine

## 2015-11-29 DIAGNOSIS — M25552 Pain in left hip: Secondary | ICD-10-CM

## 2015-11-29 DIAGNOSIS — M791 Myalgia: Secondary | ICD-10-CM

## 2015-11-29 DIAGNOSIS — M7918 Myalgia, other site: Secondary | ICD-10-CM

## 2015-11-29 MED ORDER — PREDNISONE 20 MG PO TABS: 40.0000 mg | ORAL_TABLET | Freq: Every day | ORAL | 0 refills | Status: DC

## 2015-11-29 MED ORDER — HYDROCODONE-ACETAMINOPHEN 5-325 MG PO TABS: 1.0000 | ORAL_TABLET | Freq: Four times a day (QID) | ORAL | 0 refills | Status: DC | PRN

## 2015-11-29 NOTE — ED Triage Notes (Signed)
Pt said her left hip started hurting on Thursday but did not remember injuring it. Has been taking tramadol for it that she got from her pcp (for her neuropathy in her feet) and it has not helped her feet.

## 2015-11-29 NOTE — Discharge Instructions (Signed)
Your pain appears to be coming from one of the muscles in your back. Alternate ice and heat. If one works better than the other, you can stick with that. Take prednisone daily for 5 days. This is to help with inflammation. Use the Vicodin sparingly for severe pain. Follow-up with Dr. Andria Frames as scheduled on Wednesday.

## 2015-11-29 NOTE — ED Provider Notes (Addendum)
[facility]    CSN: [id number] Arrival date & time: 11/29/15  1256     History   Chief Complaint Chief Complaint  Patient presents with  . Hip Pain    HPI Christine Santos is a 70 y.o. female.   HPI  She is a 70 year old woman here for evaluation of left hip pain.  The pain started on Thursday. It was initially located in her lower back, but has moved to the left buttock and lateral hip.  She states sometimes it will radiate down to the outside of her knee. Pain is worse first thing in the morning. This morning, it was several minutes before she was able to bear weight on the left leg. Things improve over the course of the day. She has tried her home tramadol without improvement. She denies any injury or trauma. She does not recall stumbling or falling.  Past Medical History:  Diagnosis Date  . Anemia 08/29/2011  . Arthritis    "legs" (01/22/2013)  . CAD (coronary artery disease)    s/p CABG in 1999  . CLL (chronic lymphoblastic leukemia)   . Diastolic heart failure ([place])   . GERD (gastroesophageal reflux disease)   . H/O hiatal hernia   . Hyperlipidemia   . Hypertension   . IDDM (insulin dependent diabetes mellitus) ([place])   . PAD (peripheral artery disease) ([place])   . Paroxysmal atrial fibrillation (HCC)    a. identified on ILR as part of StrokeAF study  . Peripheral vascular disease ([place])   . Stroke (cerebrum) [facility])     Patient Active Problem List   Diagnosis Date Noted  . Paroxysmal atrial fibrillation ([place]) 10/22/2015  . Acute on chronic kidney failure ([place]) 09/10/2015  . Acute lacunar infarction ([place]) 09/03/2015  . Sinusitis, chronic 08/05/2015  . Weight loss, unintentional 07/02/2015  . Diabetes mellitus type 2 with neurological manifestations ([place]) 02/26/2014  . Hyperkalemia 06/21/2013  . Diabetic nephropathy ([place]) 05/23/2013  . Diabetic neuropathy ([place]) 11/30/2012  . Macular degeneration 07/04/2012  . Diabetic retinopathy ([place]) 03/20/2012  .  Osteopenia 09/22/2011  . Foot pain 09/21/2011  . Anemia in chronic kidney disease 08/29/2011  . Gout 04/13/2011  . Stress incontinence, female 11/11/2010  . Anxiety 08/18/2010  . Chronic systolic heart failure ([place]) 02/18/2009  . SINUS BRADYCARDIA 10/29/2008  . Peripheral vascular disease ([place]) 06/18/2008  . Chronic kidney disease, stage IV (severe) ([place]) 07/13/2007  . Chronic lymphocytic leukemia ([place]) 10/13/2006  . 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 HYPERCHOLESTEROLEMIA 03/23/2006  . OBESITY, NOS 03/23/2006  . Essential hypertension 03/23/2006  . Coronary atherosclerosis 03/23/2006  . REFLUX ESOPHAGITIS 03/23/2006  . IRRITABLE BOWEL SYNDROME 03/23/2006  . CYSTOCELE/RECTOCELE/PROLAPSE,UNSPEC. 03/23/2006  . Osteoarthritis, multiple sites 03/23/2006    Past Surgical History:  Procedure Laterality Date  . ANGIOPLASTY / STENTING FEMORAL Right 09/2010   SFA/notes 11/25/2010 (01/22/2013)  . ANGIOPLASTY / STENTING FEMORAL Left 11/2010   SFA/notes 11/25/2010 (01/22/2013)  . ANGIOPLASTY / STENTING ILIAC     [name] 11/25/2010 (01/22/2013)  . CATARACT EXTRACTION W/ INTRAOCULAR LENS  IMPLANT, BILATERAL Bilateral ?2011  . CHOLECYSTECTOMY  1993  . CORONARY ARTERY BYPASS GRAFT  10/1994   "CABG X3"  . EP IMPLANTABLE DEVICE N/A 09/11/2015   Procedure: Loop Recorder Insertion;  Surgeon: Thompson Grayer, MD;  Location: [place] [facility];  Service: Cardiovascular;  Laterality: N/A;  . HEEL SPUR EXCISION Bilateral 1970's  . LOWER EXTREMITY ANGIOGRAM N/A 01/22/2013   Procedure: LOWER EXTREMITY  ANGIOGRAM;  Surgeon: [name], MD;  Location: [facility] CATH LAB;  Service: Cardiovascular;  Laterality: N/A;  . SHOULDER OPEN ROTATOR CUFF REPAIR Bilateral 1990's   "2 times on 1 side; once on the other"  . TRANSLUMINAL ATHERECTOMY FEMORAL ARTERY Right 01/22/2013   & balloon  . TUBAL LIGATION  1980's    OB History    No data available       Home Medications     Prior to Admission medications   Medication Sig Start Date End Date Taking? Authorizing Provider  allopurinol (ZYLOPRIM) 100 MG tablet Take 1 tablet (100 mg total) by mouth daily. 09/10/15  Yes [name], MD  amLODipine (NORVASC) 10 MG tablet TAKE 1 TABLET(10 MG) BY MOUTH DAILY 09/21/15  Yes [name], MD  apixaban (ELIQUIS) 5 MG TABS tablet Take 1 tablet (5 mg total) by mouth 2 (two) times daily. 10/22/15  Yes Evans Lance, MD  Ascorbic Acid (VITAMIN C) 100 MG tablet Take 100 mg by mouth daily.   Yes Historical Provider, MD  atorvastatin (LIPITOR) 80 MG tablet TAKE 1 TABLET BY MOUTH DAILY 09/22/15  Yes [name], MD  carboxymethylcellulose (REFRESH PLUS) 0.5 % SOLN 1 drop 3 (three) times daily as needed.   Yes Historical Provider, MD  cloNIDine (CATAPRES) 0.3 MG tablet Take 1 tablet (0.3 mg total) by mouth 3 (three) times daily. 07/23/15  Yes [name], MD  fenofibrate (TRICOR) 48 MG tablet Take 1 tablet (48 mg total) by mouth daily. 04/14/15  Yes [name], MD  ferrous sulfate 325 (65 FE) MG tablet Take 1 tablet (325 mg total) by mouth daily with breakfast. 05/23/13  Yes [name], MD  fluticasone (FLONASE) 50 MCG/ACT nasal spray Place 2 sprays into both nostrils daily. 07/23/15  Yes [name], MD  GNP GARLIC EXTRACT PO Take 1 tablet by mouth daily.    Yes Historical Provider, MD  HUMALOG KWIKPEN 100 UNIT/ML KiwkPen ADMINISTER 5 UNITS UNDER THE SKIN THREE TIMES DAILY BEFORE MEALS 10/01/15  Yes [name], MD  Insulin Glargine (LANTUS SOLOSTAR) 100 UNIT/ML Solostar Pen Inject 37 Units into the skin daily. 10/14/15  Yes [name], MD  insulin lispro (HUMALOG KWIKPEN) 100 UNIT/ML KiwkPen ADM 5 UNITS [place] TID B MEALS 06/29/15  Yes Historical Provider, MD  Melatonin 10 MG CAPS Take 10 mg by mouth at bedtime as needed (sleep).    Yes Historical Provider, MD  metoprolol tartrate (LOPRESSOR) 25 MG tablet TAKE 1/2 TABLET BY MOUTH TWICE DAILY  03/31/15  Yes [name], MD  Multiple Vitamins-Minerals (MULTIVITAMINS THER. W/MINERALS) TABS Take 1 tablet by mouth daily.     Yes Historical Provider, MD  Omega-3 Fatty Acids (FISH OIL) 1000 MG CAPS Take 2,000 mg by mouth 2 (two) times daily.    Yes Historical Provider, MD  pantoprazole (PROTONIX) 40 MG tablet TAKE 1 TABLET BY MOUTH DAILY 03/31/15  Yes [name], MD  PARoxetine (PAXIL) 20 MG tablet TAKE 1 TABLET BY MOUTH EVERY MORNING 11/12/15  Yes [name], MD  torsemide (DEMADEX) 20 MG tablet Take 3 tablets (60 mg total) by mouth daily. 11/26/15  Yes [name], MD  traMADol (ULTRAM) 50 MG tablet TAKE 1 TABLET BY MOUTH FOUR TIMES DAILY 10/02/15  Yes [name], MD  HYDROcodone-acetaminophen (NORCO) 5-325 MG tablet Take 1 tablet by mouth every 6 (six) hours as needed for moderate pain. 11/29/15   Melony Overly,  MD  omega-3 acid ethyl esters (LOVAZA) 1 g capsule Take by mouth.    Historical Provider, MD  OVER THE COUNTER MEDICATION Take 1 tablet by mouth 2 (two) times daily. Macular Degenerative Eye Supplement    Historical Provider, MD  predniSONE (DELTASONE) 20 MG tablet Take 2 tablets (40 mg total) by mouth daily. 11/29/15   Melony Overly, MD    Family History Family History  Problem Relation Age of Onset  . Heart disease Mother   . Heart attack Mother   . Hypertension Mother   . Pneumonia Father   . Heart attack Father   . Diabetes Sister   . Arthritis Brother   . Diabetes Brother   . Heart attack Sister   . Obesity Sister   . Hypertension Sister   . Heart attack Sister   . Cancer Sister     breast  . Hyperlipidemia Sister   . Hypertension Sister   . Hyperlipidemia Sister     Social History Social History  Substance Use Topics  . Smoking status: Never Smoker  . Smokeless tobacco: Never Used  . Alcohol use No     Allergies   Amoxicillin; Cephalexin; and Codeine phosphate   Review of Systems Review of Systems As in history of present  illness  Physical Exam Triage Vital Signs ED Triage Vitals  Enc Vitals Group     BP 11/29/15 1358 134/65     Pulse Rate 11/29/15 1358 88     Resp 11/29/15 1358 16     Temp 11/29/15 1358 98.1 F (36.7 C)     Temp src --      SpO2 11/29/15 1358 98 %     Weight --      Height --      Head Circumference --      Peak Flow --      Pain Score 11/29/15 1400 3     Pain Loc --      Pain Edu? --      Excl. in [place]? --    No data found.   Updated Vital Signs BP 188/76 (BP Location: Left Arm)   Pulse (!) 52   Temp 98.1 F (36.7 C) (Oral)   Resp 16   SpO2 96%   Visual Acuity Right Eye Distance:   Left Eye Distance:   Bilateral Distance:    Right Eye Near:   Left Eye Near:    Bilateral Near:     Physical Exam  Constitutional: She is oriented to person, place, and time. She appears well-developed and well-nourished. No distress.  Cardiovascular: Normal rate.   Pulmonary/Chest: Effort normal.  Musculoskeletal:  Left hip: No appreciable edema. She is tender in the middle of the buttock and just lateral to the ischial tuberosity. No pain with internal or external rotation of the hip. 5 out of 5 strength in hip flexion, quadriceps, and ankle. Sensation grossly intact. Back: No vertebral tenderness.  Neurological: She is alert and oriented to person, place, and time.     UC Treatments / Results  Labs (all labs ordered are listed, but only abnormal results are displayed) Labs Reviewed - No data to display  EKG  EKG Interpretation None       Radiology No results found.  Procedures Procedures (including critical care time)  Medications Ordered in UC Medications - No data to display   Initial Impression / Assessment and Plan / UC Course  I have reviewed the triage vital signs and the  nursing notes.  Pertinent labs & imaging results that were available during my care of the patient were reviewed by me and considered in my medical decision making (see chart for  details).  Clinical Course     Pain appears to be mostly located in the piriformis muscle. Discussed alternating ice and heat as well as trying ice massage. Given her cardiac issues, will avoid NSAIDs. 5 days of low-dose prednisone to help with inflammation. Few tablets of hydrocodone given to use sparingly for severe pain. She has follow-up with her PCP on Wednesday.  Final Clinical Impressions(s) / UC Diagnoses   Final diagnoses:  Left hip pain  Piriformis muscle pain    New Prescriptions New Prescriptions   HYDROCODONE-ACETAMINOPHEN (NORCO) 5-325 MG TABLET    Take 1 tablet by mouth every 6 (six) hours as needed for moderate pain.   PREDNISONE (DELTASONE) 20 MG TABLET    Take 2 tablets (40 mg total) by mouth daily.     Melony Overly, MD 11/29/15 [place] Tameyah Koch, MD 11/29/15 1500

## 2015-11-30 ENCOUNTER — Encounter: Payer: Self-pay | Admitting: *Deleted

## 2015-11-30 DIAGNOSIS — Z006 Encounter for examination for normal comparison and control in clinical research program: Secondary | ICD-10-CM

## 2015-11-30 NOTE — Progress Notes (Signed)
STROKE-AF Research study month 3 manuel transmission with loop recorder completed. Next research study required visit is due no later than 01/MAR/2018. Patient verbalized understanding.

## 2015-12-02 ENCOUNTER — Encounter: Payer: Self-pay | Admitting: Family Medicine

## 2015-12-02 ENCOUNTER — Telehealth: Payer: Self-pay | Admitting: *Deleted

## 2015-12-02 ENCOUNTER — Ambulatory Visit (INDEPENDENT_AMBULATORY_CARE_PROVIDER_SITE_OTHER): Payer: Medicare Other | Admitting: Family Medicine

## 2015-12-02 VITALS — BP 209/68 | HR 65 | Temp 97.4°F | Ht 65.5 in | Wt 186.4 lb

## 2015-12-02 DIAGNOSIS — E1149 Type 2 diabetes mellitus with other diabetic neurological complication: Secondary | ICD-10-CM

## 2015-12-02 DIAGNOSIS — N184 Chronic kidney disease, stage 4 (severe): Secondary | ICD-10-CM

## 2015-12-02 DIAGNOSIS — I10 Essential (primary) hypertension: Secondary | ICD-10-CM

## 2015-12-02 DIAGNOSIS — E118 Type 2 diabetes mellitus with unspecified complications: Secondary | ICD-10-CM | POA: Diagnosis not present

## 2015-12-02 LAB — POCT GLYCOSYLATED HEMOGLOBIN (HGB A1C): Hemoglobin A1C: 6.7

## 2015-12-02 NOTE — Patient Instructions (Signed)
Take all the paperwork including this AVS to Dr. [name]. Problem: Since hospitalization on 09/03/15, much higher blood pressure and much more fluid retention.   During 09/03/15, bump in creat when NPO for quite some time.  Creat came back to normal but not blood pressure or fluid status. Lisinopril 40 had been a chronic medication.  Stopped when creat bump and has not been restarted due to GFR concerns. Still volume overloaded on exam despite me pushing dose of torsemide now to 60 mg daily. Control of BP is also not good.  My focus has been to try to get volume status better, but I am now worried about lack of diuresis and small bump in creat.

## 2015-12-02 NOTE — Telephone Encounter (Signed)
Contacted pt to inform her of her A1c results. Katharina Caper, April D, [place]

## 2015-12-03 ENCOUNTER — Ambulatory Visit (INDEPENDENT_AMBULATORY_CARE_PROVIDER_SITE_OTHER): Payer: Medicare Other | Admitting: Cardiology

## 2015-12-03 ENCOUNTER — Encounter: Payer: Self-pay | Admitting: Cardiology

## 2015-12-03 VITALS — BP 146/80 | HR 51 | Ht 65.0 in | Wt 187.8 lb

## 2015-12-03 DIAGNOSIS — C9110 Chronic lymphocytic leukemia of B-cell type not having achieved remission: Secondary | ICD-10-CM | POA: Diagnosis not present

## 2015-12-03 DIAGNOSIS — N184 Chronic kidney disease, stage 4 (severe): Secondary | ICD-10-CM | POA: Diagnosis not present

## 2015-12-03 DIAGNOSIS — I10 Essential (primary) hypertension: Secondary | ICD-10-CM

## 2015-12-03 DIAGNOSIS — I639 Cerebral infarction, unspecified: Secondary | ICD-10-CM | POA: Diagnosis not present

## 2015-12-03 DIAGNOSIS — R600 Localized edema: Secondary | ICD-10-CM | POA: Diagnosis not present

## 2015-12-03 MED ORDER — HYDRALAZINE HCL 25 MG PO TABS: 12.5000 mg | ORAL_TABLET | Freq: Three times a day (TID) | ORAL | 3 refills | Status: DC

## 2015-12-03 NOTE — Progress Notes (Signed)
12/03/2015 Christine Santos   03-08-1945  [id number]  Primary Physician Zigmund Gottron, MD Primary Cardiologist: Dr. Irish Lack Electrophysiologist: Dr. Rayann Heman   Reason for Visit/CC: HTN  HPI:  The patient is a 70 year old female, followed by Dr. Irish Lack and Dr. Rayann Heman. Her history is significant for CAD status post CABG in 1996, hypertension, chronic kidney disease followed by Dr. [name], CLL status post chemotherapy in remission, bilateral lower extremity PVD, prior history of left renal cell carcinoma, chronic diastolic dysfunction, recent stroke with placement of a loop recorder demonstrating paroxysmal atrial fibrillation. She is now on chronic anticoagulation therapy with Eliquis. She also has a history of type 2 diabetes mellitus. Her most recent echocardiogram 09/04/2015 showed an EF of 55-60% with grade 1 diastolic dysfunction.  She presents to clinic today with a chief complaint of severely elevated blood pressure readings. She reports that over the last several weeks her systolic blood pressures have ranged from the 180s to the low 200s. She's been asymptomatic with this denying headache, chest pain or dyspnea. No dizziness, lightheadedness, syncope/near-syncope. She reports full medication compliance. No excess salt nor caffeine or ETOH. She does not smoke. She is maxed out on her amlodipine at 10 mg daily. Given her chronic kidney disease, her lisinopril was discontinued. She is on high-dose clonidine, 0.3 mg 3 times a day. She is on Metroprolol 12.5 mg twice a day however her heart rate limits upper titration of this. Heart rate is currently in the mid 50s. She is also on torsemide. Her PCP recently increased this to 60 mg daily. She has 2+ bilateral lower extemity edema on exam. She reports that she is scheduled to follow-up with her nephrologist, Dr. [name],  this afternoon at 1:30. She reports that her PCP wanted Dr. [name] to further adjust her diuretic.  Her blood pressure has  been checked multiple times in clinic today in both arms. Systolic blood pressures have ranged in the 140s to 150s. Diastolic pressures in the 80s to 90s. She is a symptomatic.   Current Meds  Medication Sig  . allopurinol (ZYLOPRIM) 100 MG tablet Take 1 tablet (100 mg total) by mouth daily.  Marland Kitchen amLODipine (NORVASC) 10 MG tablet TAKE 1 TABLET(10 MG) BY MOUTH DAILY  . apixaban (ELIQUIS) 5 MG TABS tablet Take 1 tablet (5 mg total) by mouth 2 (two) times daily.  . Ascorbic Acid (VITAMIN C) 100 MG tablet Take 100 mg by mouth daily.  Marland Kitchen atorvastatin (LIPITOR) 80 MG tablet TAKE 1 TABLET BY MOUTH DAILY  . carboxymethylcellulose (REFRESH PLUS) 0.5 % SOLN 1 drop 3 (three) times daily as needed.  . cloNIDine (CATAPRES) 0.3 MG tablet Take 1 tablet (0.3 mg total) by mouth 3 (three) times daily.  . fenofibrate (TRICOR) 48 MG tablet Take 1 tablet (48 mg total) by mouth daily.  . ferrous sulfate 325 (65 FE) MG tablet Take 1 tablet (325 mg total) by mouth daily with breakfast.  . fluticasone (FLONASE) 50 MCG/ACT nasal spray Place 2 sprays into both nostrils daily.  Marland Kitchen GNP GARLIC EXTRACT PO Take 1 tablet by mouth daily.   Marland Kitchen HUMALOG KWIKPEN 100 UNIT/ML KiwkPen ADMINISTER 5 UNITS UNDER THE SKIN THREE TIMES DAILY BEFORE MEALS  . HYDROcodone-acetaminophen (NORCO) 5-325 MG tablet Take 1 tablet by mouth every 6 (six) hours as needed for moderate pain.  . Insulin Glargine (LANTUS SOLOSTAR) 100 UNIT/ML Solostar Pen Inject 37 Units into the skin daily.  . insulin lispro (HUMALOG KWIKPEN) 100 UNIT/ML KiwkPen ADM 5 UNITS [place] TID  B MEALS  . Melatonin 10 MG CAPS Take 10 mg by mouth at bedtime as needed (sleep).   . metoprolol tartrate (LOPRESSOR) 25 MG tablet TAKE 1/2 TABLET BY MOUTH TWICE DAILY  . Multiple Vitamins-Minerals (MULTIVITAMINS THER. W/MINERALS) TABS Take 1 tablet by mouth daily.    Marland Kitchen omega-3 acid ethyl esters (LOVAZA) 1 g capsule Take by mouth.  Marland Kitchen OVER THE COUNTER MEDICATION Take 1 tablet by mouth 2 (two) times  daily. Macular Degenerative Eye Supplement  . pantoprazole (PROTONIX) 40 MG tablet TAKE 1 TABLET BY MOUTH DAILY  . PARoxetine (PAXIL) 20 MG tablet TAKE 1 TABLET BY MOUTH EVERY MORNING  . torsemide (DEMADEX) 20 MG tablet Take 3 tablets (60 mg total) by mouth daily.  . traMADol (ULTRAM) 50 MG tablet Take 50 mg by mouth every 6 (six) hours as needed.   Allergies  Allergen Reactions  . Amoxicillin Rash    Has patient had a PCN reaction causing immediate rash, facial/tongue/throat swelling, SOB or lightheadedness with hypotension: No Has patient had a PCN reaction causing severe rash involving mucus membranes or skin necrosis: No Has patient had a PCN reaction that required hospitalization: No Has patient had a PCN reaction occurring within the last 10 years: No If all of the above answers are "NO", then may proceed with Cephalosporin use.   . Cephalexin Rash  . Codeine Phosphate Rash   Past Medical History:  Diagnosis Date  . Anemia 08/29/2011  . Arthritis    "legs" (01/22/2013)  . CAD (coronary artery disease)    s/p CABG in 1999  . CLL (chronic lymphoblastic leukemia)   . Diastolic heart failure ([place])   . GERD (gastroesophageal reflux disease)   . H/O hiatal hernia   . Hyperlipidemia   . Hypertension   . IDDM (insulin dependent diabetes mellitus) ([place])   . PAD (peripheral artery disease) ([place])   . Paroxysmal atrial fibrillation (HCC)    a. identified on ILR as part of StrokeAF study  . Peripheral vascular disease ([place])   . Stroke (cerebrum) [facility])    Family History  Problem Relation Age of Onset  . Heart disease Mother   . Heart attack Mother   . Hypertension Mother   . Pneumonia Father   . Heart attack Father   . Diabetes Sister   . Arthritis Brother   . Diabetes Brother   . Heart attack Sister   . Obesity Sister   . Hypertension Sister   . Heart attack Sister   . Cancer Sister     breast  . Hyperlipidemia Sister   . Hypertension Sister   . Hyperlipidemia Sister      Past Surgical History:  Procedure Laterality Date  . ANGIOPLASTY / STENTING FEMORAL Right 09/2010   SFA/notes 11/25/2010 (01/22/2013)  . ANGIOPLASTY / STENTING FEMORAL Left 11/2010   SFA/notes 11/25/2010 (01/22/2013)  . ANGIOPLASTY / STENTING ILIAC     [name] 11/25/2010 (01/22/2013)  . CATARACT EXTRACTION W/ INTRAOCULAR LENS  IMPLANT, BILATERAL Bilateral ?2011  . CHOLECYSTECTOMY  1993  . CORONARY ARTERY BYPASS GRAFT  10/1994   "CABG X3"  . EP IMPLANTABLE DEVICE N/A 09/11/2015   Procedure: Loop Recorder Insertion;  Surgeon: Thompson Grayer, MD;  Location: [place] CV LAB;  Service: Cardiovascular;  Laterality: N/A;  . HEEL SPUR EXCISION Bilateral 1970's  . LOWER EXTREMITY ANGIOGRAM N/A 01/22/2013   Procedure: LOWER EXTREMITY ANGIOGRAM;  Surgeon: [name], MD;  Location[facility] CATH LAB;  Service: Cardiovascular;  Laterality: N/A;  .  SHOULDER OPEN ROTATOR CUFF REPAIR Bilateral 1990's   "2 times on 1 side; once on the other"  . TRANSLUMINAL ATHERECTOMY FEMORAL ARTERY Right 01/22/2013   & balloon  . TUBAL LIGATION  1980's   Social History   Social History  . Marital status: Divorced    Spouse name: N/A  . Number of children: 2  . Years of education: 12   Occupational History  . DISABILITY Other    Worked at [employer] Topics  . Smoking status: Never Smoker  . Smokeless tobacco: Never Used  . Alcohol use No  . Drug use: No  . Sexual activity: Not Currently   Other Topics Concern  . Not on file   Social History Narrative   Health Care POA:    Emergency Contact: sister, [name] (h) [phone]   End of Life Plan:    Who lives with you: female friend   Any pets: none   Diet: Pt has a varied diet of protein, starch, vegetables   Exercise: Pt does not have any regular exercise routine.   Seatbelts: Pt reports wearing seatbelt when in vehicles.    Sun Exposure/Protection: Pt reports using sun screen.   Hobbies: going to lake, biking, blue  grass music           Review of Systems: General: negative for chills, fever, night sweats or weight changes.  Cardiovascular: negative for chest pain, dyspnea on exertion, edema, orthopnea, palpitations, paroxysmal nocturnal dyspnea or shortness of breath Dermatological: negative for rash Respiratory: negative for cough or wheezing Urologic: negative for hematuria Abdominal: negative for nausea, vomiting, diarrhea, bright red blood per rectum, melena, or hematemesis Neurologic: negative for visual changes, syncope, or dizziness All other systems reviewed and are otherwise negative except as noted above.   Physical Exam:  Blood pressure (!) 146/80, pulse (!) 51, height 5\' 5"  (1.651 m), weight 187 lb 12.8 oz (85.2 kg), SpO2 98 %.  General appearance: alert, cooperative and no distress Neck: no carotid bruit and no JVD Lungs: clear to auscultation bilaterally Heart: regular rate and rhythm, S1, S2 normal, no murmur, click, rub or gallop Extremities: 2+ bilateral LEE Pulses: 2+ and symmetric Skin: Skin color, texture, turgor normal. No rashes or lesions Neurologic: Grossly normal  EKG not peformed  ASSESSMENT AND PLAN:   1. HTN: BP is moderately elevated. Has been high at home. She's been asymptomatic with this denying headache, chest pain or dyspnea. No dizziness, lightheadedness, syncope/near-syncope. She reports full medication compliance. No excess salt nor caffeine or ETOH. She does not smoke. She is maxed out on her amlodipine at 10 mg daily. Given her chronic kidney disease, her lisinopril was discontinued. She is on high-dose clonidine, 0.3 mg 3 times a day. She is on Metroprolol 12.5 mg twice a day however her heart rate limits upper titration of this. Heart rate is currently in the mid 50s. She is also on torsemide, 60 mg daily. Dr. [name] to further adjust today. We will add hydralazine 12.5 mg TID. F/u in HTN clinic in 2 weeks with pharmacy.   2. Chronic Diastolic HF: she is  volume overloaded with 2+ bilateral LEE. No dyspnea. Lungs are CTAB. She has CKD with Scr in the 3 range. She is scheduled to see her primary nephrologist, Dr. [name], today who will further adjust he diuretic. She was referred by her PCP back to Dr. [name] for this reason. We discussed low sodium diet.   3. PAF: RRR  on exam. Asymptomatic. HR is controlled on BB. She is on Eliquis for a/c.  4. H/o CVA: now on Eliquis. No ASA.  5. LE PVD: stable w/o claudication.   6. CAD: s/p CABG in 1996. Stable w/o angina.   7. CLL: s/p chemo. In remission.  8. DM: followed by PCP.   9. CKD: followed by Dr. [name].   PLAN  F/u in HTN clinic with pharmacy in 2 weeks.   Lyda Jester PA-C 12/03/2015 4:44 PM

## 2015-12-03 NOTE — Assessment & Plan Note (Signed)
Poor control.  I believe she needs further diuresis but await nephro opinion.

## 2015-12-03 NOTE — Patient Instructions (Signed)
Medication Instructions:  START Hydralazine (Apresoline) 12.5 mg three times daily   Labwork: None Ordered   Testing/Procedures: None Ordered   Follow-Up: Your physician recommends that you schedule a follow-up appointment in: 2 weeks in Hypertension Clinic   You will receive notification to schedule an appointment for June 2018 with Dr. Irish Lack  If you need a refill on your cardiac medications before your next appointment, please call your pharmacy.   Thank you for choosing [facility]! Christen Bame, RN [phone]

## 2015-12-03 NOTE — Progress Notes (Signed)
   Subjective:    Patient ID: Christine Santos, female    DOB: 11-21-1945, 70 y.o.   MRN: [id number]  HPI  Recheck volume overload and CKD.  Unfortunately, pushing dose of torsemide to 60 mg daily did not promote diuresis.  She has noticed no change in frequency of urination and not change in weight.  Her creat bumped up slighty (GFR dropped from 18 to 16 to 14.)  She does have an appointment tomorrow with her nephrologist at my urging.   Review of Systems     Objective:   Physical Exam BP still elevated Lungs clear Ext still 2-3+ bilateral edema        Assessment & Plan:

## 2015-12-03 NOTE — Assessment & Plan Note (Signed)
Good A1C at goal

## 2015-12-03 NOTE — Assessment & Plan Note (Signed)
I believe she needs further diuresis but await nephro opinion.  Thus far, pushing diuresis has only bumped her creat without much/any diuresis.

## 2015-12-07 DIAGNOSIS — J00 Acute nasopharyngitis [common cold]: Secondary | ICD-10-CM | POA: Diagnosis not present

## 2015-12-07 DIAGNOSIS — R490 Dysphonia: Secondary | ICD-10-CM | POA: Diagnosis not present

## 2015-12-08 ENCOUNTER — Other Ambulatory Visit: Payer: Self-pay | Admitting: Family Medicine

## 2015-12-09 ENCOUNTER — Other Ambulatory Visit: Payer: Self-pay | Admitting: *Deleted

## 2015-12-09 MED ORDER — FENOFIBRATE 48 MG PO TABS: 48.0000 mg | ORAL_TABLET | Freq: Every day | ORAL | 3 refills | Status: DC

## 2015-12-10 ENCOUNTER — Other Ambulatory Visit: Payer: Self-pay

## 2015-12-10 MED ORDER — FENOFIBRATE 48 MG PO TABS
48.0000 mg | ORAL_TABLET | Freq: Every day | ORAL | 3 refills | Status: DC
Start: 2015-12-10 — End: 2015-12-10

## 2015-12-10 MED ORDER — FENOFIBRATE 48 MG PO TABS: 48.0000 mg | ORAL_TABLET | Freq: Every day | ORAL | 3 refills | Status: DC

## 2015-12-12 ENCOUNTER — Ambulatory Visit (INDEPENDENT_AMBULATORY_CARE_PROVIDER_SITE_OTHER): Payer: Medicare Other | Admitting: Family Medicine

## 2015-12-12 VITALS — BP 128/80 | HR 82 | Temp 97.7°F | Resp 16 | Ht 65.25 in | Wt 185.4 lb

## 2015-12-12 DIAGNOSIS — N184 Chronic kidney disease, stage 4 (severe): Secondary | ICD-10-CM | POA: Diagnosis not present

## 2015-12-12 DIAGNOSIS — M62830 Muscle spasm of back: Secondary | ICD-10-CM | POA: Diagnosis not present

## 2015-12-12 DIAGNOSIS — M545 Low back pain: Secondary | ICD-10-CM

## 2015-12-12 MED ORDER — LIDOCAINE 4 % EX CREA: 1.0000 "application " | TOPICAL_CREAM | Freq: Four times a day (QID) | CUTANEOUS | 0 refills | Status: DC | PRN

## 2015-12-12 NOTE — Patient Instructions (Addendum)
  Use Tramadol for pain Apply AsperCreme with Lidocaine - apply every six hours for pain   IF you received an x-ray today, you will receive an invoice from [facility]. Please contact [facility] at [phone] with questions or concerns regarding your invoice.   IF you received labwork today, you will receive an invoice from [employer]. Please contact [facility] at [phone] with questions or concerns regarding your invoice.   Our billing staff will not be able to assist you with questions regarding bills from these companies.  You will be contacted with the lab results as soon as they are available. The fastest way to get your results is to activate your My Chart account. Instructions are located on the last page of this paperwork. If you have not heard from [place] regarding the results in 2 weeks, please contact this office.     Muscle Cramps and Spasms Muscle cramps and spasms are when muscles tighten by themselves. They usually get better within minutes. Muscle cramps are painful. They are usually stronger and last longer than muscle spasms. Muscle spasms may or may not be painful. They can last a few seconds or much longer. HOME CARE  Drink enough fluid to keep your pee (urine) clear or pale yellow.  Massage, stretch, and relax the muscle.  Use a warm towel, heating pad, or warm shower water on tight muscles.  Place ice on the muscle if it is tender or in pain.  Put ice in a plastic bag.  Place a towel between your skin and the bag.  Leave the ice on for 15-20 minutes, 3-4 times a day.  Only take medicine as told by your doctor. GET HELP RIGHT AWAY IF:  Your cramps or spasms get worse, happen more often, or do not get better with time. MAKE SURE YOU:  Understand these instructions.  Will watch your condition.  Will get help right away if you are not doing well or get worse. This information is not intended to replace advice  given to you by your health care provider. Make sure you discuss any questions you have with your health care provider. Document Released: 12/24/2007 Document Revised: 05/07/2012 Document Reviewed: 10/14/2014 Elsevier Interactive Patient Education  2017 [employer].

## 2015-12-12 NOTE — Progress Notes (Signed)
Chief Complaint  Patient presents with  . Back Pain    lower x 2 wks    HPI  This is a new patient She reports that she is here today because of a 2 week history of low back pain that radiates down the thigh and the legs.   She reports that the low back pain seems to be in the divots of her buttocks on both sides but today it is worse in the left buttocks and is sharp like a stabbing pain.   She reports that she gets pain in the joint of the hip.  She went to Urgent Care and was given prednisone and oxycodone.  When the pain is present she feels like she cannot put her foot on the floor.   She had a stroke 3 months ago.  She denies residual weakness from the stroke and only had double vision after the stroke.   She also has paroxysmal afib and is on Eliquis. She has been on eliquis for 3 months.   Past Medical History:  Diagnosis Date  . Anemia 08/29/2011  . Anxiety   . Arthritis    "legs" (01/22/2013)  . CAD (coronary artery disease)    s/p CABG in 1999  . Cataract   . Chronic kidney disease   . CLL (chronic lymphoblastic leukemia)   . Diastolic heart failure ([place])   . GERD (gastroesophageal reflux disease)   . H/O hiatal hernia   . Heart murmur   . Hyperlipidemia   . Hypertension   . IDDM (insulin dependent diabetes mellitus) ([place])   . PAD (peripheral artery disease) ([place])   . Paroxysmal atrial fibrillation (HCC)    a. identified on ILR as part of StrokeAF study  . Peripheral vascular disease ([place])   . Stroke (cerebrum) [facility])     Current Outpatient Prescriptions  Medication Sig Dispense Refill  . allopurinol (ZYLOPRIM) 100 MG tablet Take 1 tablet (100 mg total) by mouth daily. 90 tablet 3  . amLODipine (NORVASC) 10 MG tablet TAKE 1 TABLET(10 MG) BY MOUTH DAILY 90 tablet 3  . apixaban (ELIQUIS) 5 MG TABS tablet Take 1 tablet (5 mg total) by mouth 2 (two) times daily. 60 tablet 11  . Ascorbic Acid (VITAMIN C) 100 MG tablet Take 100 mg by mouth daily.    Marland Kitchen  atorvastatin (LIPITOR) 80 MG tablet TAKE 1 TABLET BY MOUTH DAILY 90 tablet 2  . carboxymethylcellulose (REFRESH PLUS) 0.5 % SOLN 1 drop 3 (three) times daily as needed.    . cloNIDine (CATAPRES) 0.3 MG tablet Take 1 tablet (0.3 mg total) by mouth 3 (three) times daily. 270 tablet 3  . fenofibrate (TRICOR) 48 MG tablet Take 1 tablet (48 mg total) by mouth daily. 90 tablet 3  . ferrous sulfate 325 (65 FE) MG tablet Take 1 tablet (325 mg total) by mouth daily with breakfast.  3  . fluticasone (FLONASE) 50 MCG/ACT nasal spray Place 2 sprays into both nostrils daily. 16 g 6  . GNP GARLIC EXTRACT PO Take 1 tablet by mouth daily.     Marland Kitchen HUMALOG KWIKPEN 100 UNIT/ML KiwkPen ADMINISTER 5 UNITS UNDER THE SKIN THREE TIMES DAILY BEFORE MEALS 15 mL 12  . hydrALAZINE (APRESOLINE) 25 MG tablet Take 0.5 tablets (12.5 mg total) by mouth 3 (three) times daily. 135 tablet 3  . HYDROcodone-acetaminophen (NORCO) 5-325 MG tablet Take 1 tablet by mouth every 6 (six) hours as needed for moderate pain. 10 tablet 0  . LANTUS SOLOSTAR  100 UNIT/ML Solostar Pen ADMINISTER 35 UNITS UNDER THE SKIN DAILY 15 mL 12  . Melatonin 10 MG CAPS Take 10 mg by mouth at bedtime as needed (sleep).     . metoprolol tartrate (LOPRESSOR) 25 MG tablet TAKE 1/2 TABLET BY MOUTH TWICE DAILY 90 tablet 3  . Multiple Vitamins-Minerals (MULTIVITAMINS THER. W/MINERALS) TABS Take 1 tablet by mouth daily.      Marland Kitchen omega-3 acid ethyl esters (LOVAZA) 1 g capsule Take by mouth.    Marland Kitchen OVER THE COUNTER MEDICATION Take 1 tablet by mouth 2 (two) times daily. Macular Degenerative Eye Supplement    . pantoprazole (PROTONIX) 40 MG tablet TAKE 1 TABLET BY MOUTH DAILY 90 tablet 3  . PARoxetine (PAXIL) 20 MG tablet TAKE 1 TABLET BY MOUTH EVERY MORNING 90 tablet 3  . torsemide (DEMADEX) 20 MG tablet Take 3 tablets (60 mg total) by mouth daily. 90 tablet 3  . traMADol (ULTRAM) 50 MG tablet Take 50 mg by mouth every 6 (six) hours as needed.    . insulin lispro (HUMALOG  KWIKPEN) 100 UNIT/ML KiwkPen ADM 5 UNITS [place] TID B MEALS    . lidocaine (ASPERCREME W/LIDOCAINE) 4 % cream Apply 1 application topically 4 (four) times daily as needed. 30 g 0   No current facility-administered medications for this visit.     Allergies:  Allergies  Allergen Reactions  . Amoxicillin Rash    Has patient had a PCN reaction causing immediate rash, facial/tongue/throat swelling, SOB or lightheadedness with hypotension: No Has patient had a PCN reaction causing severe rash involving mucus membranes or skin necrosis: No Has patient had a PCN reaction that required hospitalization: No Has patient had a PCN reaction occurring within the last 10 years: No If all of the above answers are "NO", then may proceed with Cephalosporin use.   . Cephalexin Rash  . Codeine Phosphate Rash    Past Surgical History:  Procedure Laterality Date  . ANGIOPLASTY / STENTING FEMORAL Right 09/2010   SFA/notes 11/25/2010 (01/22/2013)  . ANGIOPLASTY / STENTING FEMORAL Left 11/2010   SFA/notes 11/25/2010 (01/22/2013)  . ANGIOPLASTY / STENTING ILIAC     [name] 11/25/2010 (01/22/2013)  . CATARACT EXTRACTION W/ INTRAOCULAR LENS  IMPLANT, BILATERAL Bilateral ?2011  . CHOLECYSTECTOMY  1993  . CORONARY ARTERY BYPASS GRAFT  10/1994   "CABG X3"  . EP IMPLANTABLE DEVICE N/A 09/11/2015   Procedure: Loop Recorder Insertion;  Surgeon: Thompson Grayer, MD;  Location: [place] CV LAB;  Service: Cardiovascular;  Laterality: N/A;  . HEEL SPUR EXCISION Bilateral 1970's  . LOWER EXTREMITY ANGIOGRAM N/A 01/22/2013   Procedure: LOWER EXTREMITY ANGIOGRAM;  Surgeon: [name], MD;  Location: [facility] CATH LAB;  Service: Cardiovascular;  Laterality: N/A;  . SHOULDER OPEN ROTATOR CUFF REPAIR Bilateral 1990's   "2 times on 1 side; once on the other"  . TRANSLUMINAL ATHERECTOMY FEMORAL ARTERY Right 01/22/2013   & balloon  . TUBAL LIGATION  1980's    Social History   Social History  . Marital status: Divorced     Spouse name: N/A  . Number of children: 2  . Years of education: 12   Occupational History  . DISABILITY Other    Worked at [employer] Topics  . Smoking status: Never Smoker  . Smokeless tobacco: Never Used  . Alcohol use No  . Drug use: No  . Sexual activity: Not Currently   Other Topics Concern  . None   Social History  Narrative   Health Care POA:    Emergency Contact: sister, [name] (h) [phone]   End of Life Plan:    Who lives with you: female friend   Any pets: none   Diet: Pt has a varied diet of protein, starch, vegetables   Exercise: Pt does not have any regular exercise routine.   Seatbelts: Pt reports wearing seatbelt when in vehicles.    Sun Exposure/Protection: Pt reports using sun screen.   Hobbies: going to lake, biking, blue grass music          ROS  Objective: Vitals:   12/12/15 1358  BP: 128/80  Pulse: 82  Resp: 16  Temp: 97.7 F (36.5 C)  TempSrc: Oral  SpO2: 96%  Weight: 185 lb 6.4 oz (84.1 kg)  Height: 5' 5.25" (1.657 m)    Physical Exam  General: alert, oriented, in NAD Head: normocephalic, atraumatic, no sinus tenderness Eyes: EOM intact, no scleral icterus or conjunctival injection Heart: normal rate, normal sinus rhythm, no murmurs Lungs: clear to auscultation bilaterally, no wheezing   Lumbar Radiculopathy Exam Back exam: full range of motion, no tenderness, palpable spasm or pain on motion. Straight-leg raise: positive bilaterally Reflexes:  Patellar tendon bilaterally 2+ Strength: normal and equal bilaterally  Sensory exam: normal in both lower extremities.   Assessment and Plan Kynzlie was seen today for back pain.  Diagnoses and all orders for this visit:  Acute bilateral low back pain, with sciatica presence unspecified- referred to PT for pain mgmt Also advised topical rubs -     Ambulatory referral to Physical Therapy  Chronic kidney disease, stage IV (severe) ([place])- advised topical  rubs No nsaids  Back muscle spasm- advised pt follow up for exercises to reduce back spasms -     Ambulatory referral to Physical Therapy  Other orders -     lidocaine (ASPERCREME W/LIDOCAINE) 4 % cream; Apply 1 application topically 4 (four) times daily as needed.     [place]

## 2015-12-14 ENCOUNTER — Ambulatory Visit: Payer: Medicare Other | Admitting: Internal Medicine

## 2015-12-21 ENCOUNTER — Encounter: Payer: Self-pay | Admitting: Pharmacist

## 2015-12-21 ENCOUNTER — Ambulatory Visit: Payer: Medicare Other | Attending: Family Medicine | Admitting: Physical Therapy

## 2015-12-21 ENCOUNTER — Encounter: Payer: Self-pay | Admitting: Physical Therapy

## 2015-12-21 ENCOUNTER — Ambulatory Visit (INDEPENDENT_AMBULATORY_CARE_PROVIDER_SITE_OTHER): Payer: Medicare Other | Admitting: Pharmacist

## 2015-12-21 VITALS — BP 130/68 | HR 77

## 2015-12-21 DIAGNOSIS — M79605 Pain in left leg: Secondary | ICD-10-CM | POA: Insufficient documentation

## 2015-12-21 DIAGNOSIS — I10 Essential (primary) hypertension: Secondary | ICD-10-CM

## 2015-12-21 DIAGNOSIS — R262 Difficulty in walking, not elsewhere classified: Secondary | ICD-10-CM

## 2015-12-21 DIAGNOSIS — M545 Low back pain: Secondary | ICD-10-CM | POA: Diagnosis not present

## 2015-12-21 MED ORDER — CLONIDINE HCL 0.2 MG PO TABS: 0.2000 mg | ORAL_TABLET | Freq: Three times a day (TID) | ORAL | 0 refills | Status: DC

## 2015-12-21 NOTE — Therapy (Signed)
[facility] [place], [place], [address] Phone: [phone]   Fax:  [phone]  Physical Therapy Evaluation  Patient Details  Name: Christine Santos MRN: [id number] Date of Birth: [birth year] Referring Provider: Forrest Moron, MD  Encounter Date: 12/21/2015      PT End of Session - 12/21/15 0947    Visit Number 1   Number of Visits 11   Date for PT Re-Evaluation 01/29/16   Authorization Type BCBS MCR- KX at visit 15   PT Start Time 0933   PT Stop Time 1030   PT Time Calculation (min) 57 min   Activity Tolerance Patient tolerated treatment well   Behavior During Therapy [facility] for tasks assessed/performed      Past Medical History:  Diagnosis Date  . Anemia 08/29/2011  . Anxiety   . Arthritis    "legs" (01/22/2013)  . CAD (coronary artery disease)    s/p CABG in 1999  . Cataract   . Chronic kidney disease   . CLL (chronic lymphoblastic leukemia)   . Diastolic heart failure ([place])   . GERD (gastroesophageal reflux disease)   . H/O hiatal hernia   . Heart murmur   . Hyperlipidemia   . Hypertension   . IDDM (insulin dependent diabetes mellitus) ([place])   . PAD (peripheral artery disease) ([place])   . Paroxysmal atrial fibrillation (HCC)    a. identified on ILR as part of StrokeAF study  . Peripheral vascular disease ([place])   . Stroke (cerebrum) [facility])     Past Surgical History:  Procedure Laterality Date  . ANGIOPLASTY / STENTING FEMORAL Right 09/2010   SFA/notes 11/25/2010 (01/22/2013)  . ANGIOPLASTY / STENTING FEMORAL Left 11/2010   SFA/notes 11/25/2010 (01/22/2013)  . ANGIOPLASTY / STENTING ILIAC     [name] 11/25/2010 (01/22/2013)  . CATARACT EXTRACTION W/ INTRAOCULAR LENS  IMPLANT, BILATERAL Bilateral ?2011  . CHOLECYSTECTOMY  1993  . CORONARY ARTERY BYPASS GRAFT  10/1994   "CABG X3"  . EP IMPLANTABLE DEVICE N/A 09/11/2015   Procedure: Loop Recorder Insertion;  Surgeon: Thompson Grayer, MD;  Location: [facility];  Service: Cardiovascular;  Laterality: N/A;  . HEEL SPUR EXCISION Bilateral 1970's  . LOWER EXTREMITY ANGIOGRAM N/A 01/22/2013   Procedure: LOWER EXTREMITY ANGIOGRAM;  Surgeon: [name], MD;  Location: [facility] CATH LAB;  Service: Cardiovascular;  Laterality: N/A;  . SHOULDER OPEN ROTATOR CUFF REPAIR Bilateral 1990's   "2 times on 1 side; once on the other"  . TRANSLUMINAL ATHERECTOMY FEMORAL ARTERY Right 01/22/2013   & balloon  . TUBAL LIGATION  1980's    There were no vitals filed for this visit.       Subjective Assessment - 12/21/15 0940    Subjective Began about 2 months ago, insidious onset. Ached in L buttock, felt "like it was on fire"  Moves L to R and occasionally bilat. Feels like a grabbing when standing, feels like she is going to fall. Has felt unsteady since stroke in August 2017.    How long can you sit comfortably? unable   How long can you stand comfortably? <10 min   How long can you walk comfortably? unable to navigate [employer]   Patient Stated Goals decrease pain, walk   Currently in Pain? Yes   Pain Score 7    Pain Location Buttocks   Pain Orientation Left   Pain Descriptors / Indicators Burning   Pain Type Acute pain   Pain Radiating  Towards lower back   Aggravating Factors  sitting, standing   Pain Relieving Factors tramadol, lidocane cream, lay down            [facility] PT Assessment - 12/21/15 0001      Assessment   Medical Diagnosis LBP   Referring Provider Forrest Moron, MD   Hand Dominance Right   Prior Therapy home health [place] in the year     Precautions   Precautions None   Precaution Comments BP fluctuations with past PT exercises     Restrictions   Weight Bearing Restrictions No     Balance Screen   Has the patient fallen in the past 6 months No     [place] residence   Additional Comments 2 steps to enter home     Prior Function   Level of Independence Independent      Cognition   Overall Cognitive Status Within Functional Limits for tasks assessed     Observation/Other Assessments   Focus on Therapeutic Outcomes (FOTO)  31% ability     Posture/Postural Control   Posture Comments decreased lumbar lordosis, hips anterior drift     ROM / Strength   AROM / PROM / Strength Strength     Strength   Strength Assessment Site Hip   Right/Left Hip Right;Left   Right Hip Flexion 3+/5   Right Hip Extension 3-/5   Left Hip Flexion 3/5   Left Hip Extension 3-/5     Palpation   SI assessment  TTP L SIJ.    Palpation comment TTP L hamstring, L ASIS                   [facility] Adult PT Treatment/Exercise - 12/21/15 0001      Exercises   Exercises Knee/Hip     Knee/Hip Exercises: Stretches   Active Hamstring Stretch Both;Other (comment)  10x3s each     Knee/Hip Exercises: Supine   Hip Adduction Isometric Limitations abduction/adduction with belt and ball   Other Supine Knee/Hip Exercises lower trunk rotation     Modalities   Modalities Moist Heat     Moist Heat Therapy   Number Minutes Moist Heat 10 Minutes  4 concurrent with HEP and POC education   Moist Heat Location Lumbar Spine  SIJ     Manual Therapy   Manual Therapy Joint mobilization;Manual Traction;Soft tissue mobilization   Joint Mobilization L SIJ PA in prone   Soft tissue mobilization L HS and external rotators   Manual Traction Grade 4 LLE long axis                PT Education - 12/21/15 1040    Education provided Yes   Education Details anatomy of condition, POC, HEP, exercise form/rationale   Person(s) Educated Patient;Spouse   Methods Explanation;Demonstration;Tactile cues;Verbal cues;Handout   Comprehension Verbalized understanding;Returned demonstration;Verbal cues required;Tactile cues required;Need further instruction          PT Short Term Goals - 12/21/15 1034      PT SHORT TERM GOAL #1   Title Pt will verbalize resolution of sharp pain  when standing by 12/22   Baseline sharp pain every time she stands   Time 3   Period Weeks   Status New     PT SHORT TERM GOAL #2   Title Pt will verbalize ability to utilize stretches and exercises to decrease pain throughout the day   Baseline began educating at eval  Time 3   Period Weeks   Status New           PT Long Term Goals - 12/21/15 1037      PT LONG TERM GOAL #1   Title FOTO to 56% ability to indicate significant functional ability by 01/29/16   Baseline 31% ability at eval   Time 6   Period Weeks   Status New     PT LONG TERM GOAL #2   Title pt will be able to walk through [employer] without limitation by pain to return to PLOF   Baseline  unable at eval   Time 6   Period Weeks   Status New     PT LONG TERM GOAL #3   Title Pt will verbalize max pain <=2/10 during functional daily activities   Baseline up to 7/10 at eval   Time 6   Period Weeks   Status New     PT LONG TERM GOAL #4   Title Pt will verbalize resolution of sensation that she is going to fall due to SIJ pain   Baseline feels like she is going to fall when pain hits upon standing   Time 6   Period Weeks   Status New               Plan - 12/21/15 1028    Clinical Impression Statement Pt presents to PT with complaints of L low back and leg pain of indious onset about 2 months ago. CVA in August 2017 affecting her vision on L and her balance. Sharp pain upon standing in L SIJ as well as occasional sharp pain in quads and at ischial tuberosity. Denied pain upon palpation to ischial tuberosity.Concordant pain with AP pressure through femur, palpaiton of L SIJ and sacral mobilization indicate SIJ dysfunction that has resulted in spasm and pain of surrounding soft tissues. Pt will benefit from skilled PT in order to improve muscular support around SIJ and decrease pain in functional gait.    Rehab Potential Good   PT Frequency 2x / week   PT Duration 6 weeks   PT Treatment/Interventions  ADLs/Self Care Home Management;Cryotherapy;Electrical Stimulation;Functional mobility training;Stair training;Gait training;Ultrasound;Moist Heat;Therapeutic activities;Therapeutic exercise;Neuromuscular re-education;Patient/family education;Passive range of motion;Manual techniques;Dry needling;Taping   PT Next Visit Plan abdominal bracing, SIJ mobs   PT Home Exercise Plan hooklying LE ADD/ABD, LTR, AHSS   Consulted and Agree with Plan of Care Patient;Family member/caregiver   Family Member Consulted Husband      Patient will benefit from skilled therapeutic intervention in order to improve the following deficits and impairments:  Difficulty walking, Pain, Increased muscle spasms, Decreased activity tolerance, Improper body mechanics, Postural dysfunction, Decreased strength  Visit Diagnosis: Acute left-sided low back pain, with sciatica presence unspecified - Plan: PT plan of care cert/re-cert  Difficulty in walking, not elsewhere classified - Plan: PT plan of care cert/re-cert  Pain in left leg - Plan: PT plan of care cert/re-cert      G-Codes - 60/10/93 1041    Functional Assessment Tool Used FOTO 31% ability (goal 56% ability), clinical judgement   Functional Limitation Mobility: Walking and moving around   Mobility: Walking and Moving Around Current Status (A3557) At least 60 percent but less than 80 percent impaired, limited or restricted   Mobility: Walking and Moving Around Goal Status (D2202) At least 40 percent but less than 60 percent impaired, limited or restricted       Problem List Patient Active Problem List  Diagnosis Date Noted  . Paroxysmal atrial fibrillation ([place]) 10/22/2015  . Acute on chronic kidney failure ([place]) 09/10/2015  . Acute lacunar infarction ([place]) 09/03/2015  . Sinusitis, chronic 08/05/2015  . Weight loss, unintentional 07/02/2015  . Diabetes mellitus type 2 with neurological manifestations ([place]) 02/26/2014  . Hyperkalemia 06/21/2013  . Diabetic  nephropathy ([place]) 05/23/2013  . Diabetic neuropathy ([place]) 11/30/2012  . Macular degeneration 07/04/2012  . Diabetic retinopathy ([place]) 03/20/2012  . Osteopenia 09/22/2011  . Foot pain 09/21/2011  . Anemia in chronic kidney disease 08/29/2011  . Gout 04/13/2011  . Stress incontinence, female 11/11/2010  . Anxiety 08/18/2010  . Chronic systolic heart failure ([place]) 02/18/2009  . SINUS BRADYCARDIA 10/29/2008  . Peripheral vascular disease ([place]) 06/18/2008  . Chronic kidney disease, stage IV (severe) ([place]) 07/13/2007  . Chronic lymphocytic leukemia ([place]) 10/13/2006  . 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 HYPERCHOLESTEROLEMIA 03/23/2006  . OBESITY, NOS 03/23/2006  . Essential hypertension 03/23/2006  . Coronary atherosclerosis 03/23/2006  . REFLUX ESOPHAGITIS 03/23/2006  . IRRITABLE BOWEL SYNDROME 03/23/2006  . CYSTOCELE/RECTOCELE/PROLAPSE,UNSPEC. 03/23/2006  . Osteoarthritis, multiple sites 03/23/2006   Alette Kataoka C. Elira Colasanti PT, DPT 12/21/15 10:46 AM   [facility] [place] [address] [place], [place], [address] Phone: [phone]   Fax:  [phone]  Name: Christine Santos MRN: [id number] Date of Birth: August 10, 1945

## 2015-12-21 NOTE — Patient Instructions (Addendum)
Increase your hydralazine to 25mg  (1 full tablet) 3 times a day.  We will taper down your clonidine dose. Pick up 0.2mg  tablets and start taking 1 tablet 3 times a day for 2 weeks.   Continue taking your torsemide, metoprolol, and amlodipine as prescribed.  Monitor for improvement in dry mouth with lower dose of clonidine.  Continue to check your blood pressure at home.  Recheck your blood pressure in 2 weeks - we will work to cut back on your clonidine even more.

## 2015-12-21 NOTE — Progress Notes (Signed)
Patient ID: [name]                 DOB: 1945-05-08                      MRN: [id number]     HPI: Christine Santos is a 70 y.o. female patient of Dr. Irish Santos referred by Christine Jester, PA to HTN clinic. PMH of CAD (s/p CABG 1996), HTN, DM, CKD IV, chronic diastolic dysfunction (EF 51-76%), Afib and recent stroke (3 months ago). She came for a OV on 11/09 complaining of elevated SBP readings in the 180-200s mmHg, but denied symptoms of headache, chest pain, dyspnea, dizziness, and lightheadedness. Blood pressures over the past 6 months ranged between 140s-210/50-80s mmHg. Clinic blood pressure reading was 140-150/80-90s mmHg and HR mid 50s.  Hydralazine 12.5 mg TID was added to her current regimen of clonidine, lopressor, torsemide, and amlodipine. Patient was on lisinopril until this past August, but was d/c'd due to rise in SCr. Recent BMET of 11/01 showed elevated Scr of 3.29 (baseline 2.89) and BUN 46. Patient presents to clinic today for hypertension management.   Patient presented to urgent care on 11/18 with pain low back pain and muscle spasms. She was given prednisone and oxycodone. Patient reports 7/10 pain today.   Patient is tolerating hydralazine and denies symptoms of HA, CP, SOB, dizziness, blurred vision, and syncope. She measures her blood pressure at home daily and reports SBP readings between 140-150s mmHg with sporadic readings in the 120s. She does endorse missing clonidine doses some afternoons when not at home and experiences dry mouth. Pt brought her BP cuff to clinic and measured her BP at 140/75 mmHg. In clinic today, her blood pressure was 130/68 mmHg and HR 77.   Current HTN meds: torsemide 100 mg daily, lopressor 25 mg daily, hydralazine 12.5 mg TID, clonidine 0.3 mg TID, amlodipine 10 mg daily Previously tried: lisinopril 40 mg daily BP goal: <130/80 mmHg (new HTN guidelines)  Social History: Never smoker, denies alcohol and illicit drug use.   Diet: endorses  dietary indiscretions. Does not use salt at home and stays from packaged foods that have salt in them. Limits caffeinated coffee to one cup.   Exercise: Hasn't exercised lately due to pain in lower back. Wants to start working out at [employer].  Home BP readings: SBP 140-150s mmHg (after meds), but does drop to SBP 120s mmHg at times  Wt Readings from Last 3 Encounters:  12/12/15 185 lb 6.4 oz (84.1 kg)  12/03/15 187 lb 12.8 oz (85.2 kg)  12/02/15 186 lb 6.4 oz (84.6 kg)   BP Readings from Last 3 Encounters:  12/12/15 128/80  12/03/15 (!) 146/80  12/02/15 (!) 209/68   Pulse Readings from Last 3 Encounters:  12/12/15 82  12/03/15 (!) 51  12/02/15 65    Renal function: CrCl cannot be calculated (Patient's most recent lab result is older than the maximum 21 days allowed.).  Past Medical History:  Diagnosis Date  . Anemia 08/29/2011  . Anxiety   . Arthritis    "legs" (01/22/2013)  . CAD (coronary artery disease)    s/p CABG in 1999  . Cataract   . Chronic kidney disease   . CLL (chronic lymphoblastic leukemia)   . Diastolic heart failure ([place])   . GERD (gastroesophageal reflux disease)   . H/O hiatal hernia   . Heart murmur   . Hyperlipidemia   . Hypertension   .  IDDM (insulin dependent diabetes mellitus) ([place])   . PAD (peripheral artery disease) ([place])   . Paroxysmal atrial fibrillation (HCC)    a. identified on ILR as part of StrokeAF study  . Peripheral vascular disease ([place])   . Stroke (cerebrum) [facility])     Current Outpatient Prescriptions on File Prior to Visit  Medication Sig Dispense Refill  . allopurinol (ZYLOPRIM) 100 MG tablet Take 1 tablet (100 mg total) by mouth daily. 90 tablet 3  . amLODipine (NORVASC) 10 MG tablet TAKE 1 TABLET(10 MG) BY MOUTH DAILY 90 tablet 3  . apixaban (ELIQUIS) 5 MG TABS tablet Take 1 tablet (5 mg total) by mouth 2 (two) times daily. 60 tablet 11  . Ascorbic Acid (VITAMIN C) 100 MG tablet Take 100 mg by mouth daily.    Marland Kitchen  atorvastatin (LIPITOR) 80 MG tablet TAKE 1 TABLET BY MOUTH DAILY 90 tablet 2  . carboxymethylcellulose (REFRESH PLUS) 0.5 % SOLN 1 drop 3 (three) times daily as needed.    . cloNIDine (CATAPRES) 0.3 MG tablet Take 1 tablet (0.3 mg total) by mouth 3 (three) times daily. 270 tablet 3  . fenofibrate (TRICOR) 48 MG tablet Take 1 tablet (48 mg total) by mouth daily. 90 tablet 3  . ferrous sulfate 325 (65 FE) MG tablet Take 1 tablet (325 mg total) by mouth daily with breakfast.  3  . fluticasone (FLONASE) 50 MCG/ACT nasal spray Place 2 sprays into both nostrils daily. 16 g 6  . GNP GARLIC EXTRACT PO Take 1 tablet by mouth daily.     Marland Kitchen HUMALOG KWIKPEN 100 UNIT/ML KiwkPen ADMINISTER 5 UNITS UNDER THE SKIN THREE TIMES DAILY BEFORE MEALS 15 mL 12  . hydrALAZINE (APRESOLINE) 25 MG tablet Take 0.5 tablets (12.5 mg total) by mouth 3 (three) times daily. 135 tablet 3  . HYDROcodone-acetaminophen (NORCO) 5-325 MG tablet Take 1 tablet by mouth every 6 (six) hours as needed for moderate pain. 10 tablet 0  . insulin lispro (HUMALOG KWIKPEN) 100 UNIT/ML KiwkPen ADM 5 UNITS [place] TID B MEALS    . LANTUS SOLOSTAR 100 UNIT/ML Solostar Pen ADMINISTER 35 UNITS UNDER THE SKIN DAILY 15 mL 12  . lidocaine (ASPERCREME W/LIDOCAINE) 4 % cream Apply 1 application topically 4 (four) times daily as needed. 30 g 0  . Melatonin 10 MG CAPS Take 10 mg by mouth at bedtime as needed (sleep).     . metoprolol tartrate (LOPRESSOR) 25 MG tablet TAKE 1/2 TABLET BY MOUTH TWICE DAILY 90 tablet 3  . Multiple Vitamins-Minerals (MULTIVITAMINS THER. W/MINERALS) TABS Take 1 tablet by mouth daily.      Marland Kitchen omega-3 acid ethyl esters (LOVAZA) 1 g capsule Take by mouth.    Marland Kitchen OVER THE COUNTER MEDICATION Take 1 tablet by mouth 2 (two) times daily. Macular Degenerative Eye Supplement    . pantoprazole (PROTONIX) 40 MG tablet TAKE 1 TABLET BY MOUTH DAILY 90 tablet 3  . PARoxetine (PAXIL) 20 MG tablet TAKE 1 TABLET BY MOUTH EVERY MORNING 90 tablet 3  .  torsemide (DEMADEX) 20 MG tablet Take 3 tablets (60 mg total) by mouth daily. 90 tablet 3  . traMADol (ULTRAM) 50 MG tablet Take 50 mg by mouth every 6 (six) hours as needed.     No current facility-administered medications on file prior to visit.     Allergies  Allergen Reactions  . Amoxicillin Rash    Has patient had a PCN reaction causing immediate rash, facial/tongue/throat swelling, SOB or lightheadedness with hypotension: No  Has patient had a PCN reaction causing severe rash involving mucus membranes or skin necrosis: No Has patient had a PCN reaction that required hospitalization: No Has patient had a PCN reaction occurring within the last 10 years: No If all of the above answers are "NO", then may proceed with Cephalosporin use.   . Cephalexin Rash  . Codeine Phosphate Rash     Assessment/Plan:  1. Hypertension - Patient's blood pressure in clinic today was within the goal of <130/80 mmHg, but home SBP trend 10 points higher. Patient experiencing side effects of dry mouth which is likely attributed to high dose of clonidine. Additionally, missed doses of clonidine may be causing blood pressure spikes. Will taper clonidine to 0.2 mg TID for the next 2 weeks with a goal to discontinue clonidine completely. In tandem, will increase hydralazine to 25 mg TID in efforts to maintain blood pressure within range. Will continue lopressor, amlodipine and torsemide at current doses. Patient to return to clinic in 2 weeks to assess BP. Will plan to continue with clonidine taper at that time.  Patient seen by Leroy Libman, P4 pharmacy student.  Christine Santos, PharmD, [facility] [address] [place], [place] [address] Phone: [phone]; Fax: [phone] 12/21/2015 4:39 PM

## 2015-12-24 ENCOUNTER — Ambulatory Visit: Payer: Medicare Other | Admitting: Physical Therapy

## 2015-12-24 ENCOUNTER — Encounter: Payer: Self-pay | Admitting: Physical Therapy

## 2015-12-24 DIAGNOSIS — M545 Low back pain: Secondary | ICD-10-CM | POA: Diagnosis not present

## 2015-12-24 DIAGNOSIS — M79605 Pain in left leg: Secondary | ICD-10-CM

## 2015-12-24 DIAGNOSIS — R262 Difficulty in walking, not elsewhere classified: Secondary | ICD-10-CM

## 2015-12-24 DIAGNOSIS — N184 Chronic kidney disease, stage 4 (severe): Secondary | ICD-10-CM | POA: Diagnosis not present

## 2015-12-24 NOTE — Therapy (Signed)
[place], [place], [address] Phone: [phone]   Fax:  [phone]  Physical Therapy Treatment  Patient Details  Name: Christine Santos MRN: [id number] Date of Birth: 05/03/1945 Referring Provider: Forrest Moron, MD  Encounter Date: 12/24/2015      PT End of Session - 12/24/15 1145    Visit Number 2   Number of Visits 11   Date for PT Re-Evaluation 01/29/16   Authorization Type BCBS MCR- KX at visit 15   PT Start Time 1145   PT Stop Time 1237   PT Time Calculation (min) 52 min   Activity Tolerance Patient tolerated treatment well   Behavior During Therapy [facility] for tasks assessed/performed      Past Medical History:  Diagnosis Date  . Anemia 08/29/2011  . Anxiety   . Arthritis    "legs" (01/22/2013)  . CAD (coronary artery disease)    s/p CABG in 1999  . Cataract   . Chronic kidney disease   . CLL (chronic lymphoblastic leukemia)   . Diastolic heart failure ([place])   . GERD (gastroesophageal reflux disease)   . H/O hiatal hernia   . Heart murmur   . Hyperlipidemia   . Hypertension   . IDDM (insulin dependent diabetes mellitus) ([place])   . PAD (peripheral artery disease) ([place])   . Paroxysmal atrial fibrillation (HCC)    a. identified on ILR as part of StrokeAF study  . Peripheral vascular disease ([place])   . Stroke (cerebrum) [facility])     Past Surgical History:  Procedure Laterality Date  . ANGIOPLASTY / STENTING FEMORAL Right 09/2010   SFA/notes 11/25/2010 (01/22/2013)  . ANGIOPLASTY / STENTING FEMORAL Left 11/2010   SFA/notes 11/25/2010 (01/22/2013)  . ANGIOPLASTY / STENTING ILIAC     [name] 11/25/2010 (01/22/2013)  . CATARACT EXTRACTION W/ INTRAOCULAR LENS  IMPLANT, BILATERAL Bilateral ?2011  . CHOLECYSTECTOMY  1993  . CORONARY ARTERY BYPASS GRAFT  10/1994   "CABG X3"  . EP IMPLANTABLE DEVICE N/A 09/11/2015   Procedure: Loop Recorder Insertion;  Surgeon: Thompson Grayer, MD;  Location: [place] [facility];  Service: Cardiovascular;  Laterality: N/A;  . HEEL SPUR EXCISION Bilateral 1970's  . LOWER EXTREMITY ANGIOGRAM N/A 01/22/2013   Procedure: LOWER EXTREMITY ANGIOGRAM;  Surgeon: [name], MD;  Location: [facility] CATH LAB;  Service: Cardiovascular;  Laterality: N/A;  . SHOULDER OPEN ROTATOR CUFF REPAIR Bilateral 1990's   "2 times on 1 side; once on the other"  . TRANSLUMINAL ATHERECTOMY FEMORAL ARTERY Right 01/22/2013   & balloon  . TUBAL LIGATION  1980's    There were no vitals filed for this visit.      Subjective Assessment - 12/24/15 1147    Subjective Reports being sore. Pt and husband report noticing positive difference. Not as sore when she walks. Decreased burning.    Currently in Pain? Yes   Pain Score 5    Pain Location Back   Pain Orientation Right;Left   Pain Descriptors / Indicators Aching                         [facility] Adult PT Treatment/Exercise - 12/24/15 0001      Knee/Hip Exercises: Seated   Long Arc Quad Both;10 reps  3s holds   Sit to [employer] 10 reps     Knee/Hip Exercises: Supine   Hip Adduction Isometric Limitations abduction/adduction with belt and ball   Bridges with [employer]  Squeeze 15 reps   Bridges with [employer] 20 reps  hooklying clam in DF   Other Supine Knee/Hip Exercises lower trunk rotation   Other Supine Knee/Hip Exercises knee fall outs     Knee/Hip Exercises: Prone   Hip Extension Limitations prone glut sets     Modalities   Modalities Cryotherapy     Cryotherapy   Number Minutes Cryotherapy 10 Minutes   Cryotherapy Location Lumbar Spine   Type of Cryotherapy Ice pack                PT Education - 12/24/15 1152    Education provided Yes   Education Details progression from sharp pain to soreness, exercise form/rationale   Person(s) Educated Patient;Spouse   Methods Explanation;Demonstration;Tactile cues;Verbal cues;Handout   Comprehension Verbalized understanding;Returned  demonstration;Verbal cues required;Tactile cues required;Need further instruction          PT Short Term Goals - 12/21/15 1034      PT SHORT TERM GOAL #1   Title Pt will verbalize resolution of sharp pain when standing by 12/22   Baseline sharp pain every time she stands   Time 3   Period Weeks   Status New     PT SHORT TERM GOAL #2   Title Pt will verbalize ability to utilize stretches and exercises to decrease pain throughout the day   Baseline began educating at eval   Time 3   Period Weeks   Status New           PT Long Term Goals - 12/21/15 1037      PT LONG TERM GOAL #1   Title FOTO to 56% ability to indicate significant functional ability by 01/29/16   Baseline 31% ability at eval   Time 6   Period Weeks   Status New     PT LONG TERM GOAL #2   Title pt will be able to walk through [employer] without limitation by pain to return to PLOF   Baseline  unable at eval   Time 6   Period Weeks   Status New     PT LONG TERM GOAL #3   Title Pt will verbalize max pain <=2/10 during functional daily activities   Baseline up to 7/10 at eval   Time 6   Period Weeks   Status New     PT LONG TERM GOAL #4   Title Pt will verbalize resolution of sensation that she is going to fall due to SIJ pain   Baseline feels like she is going to fall when pain hits upon standing   Time 6   Period Weeks   Status New               Plan - 12/24/15 1243    Clinical Impression Statement Pt denied pain following treatment today. Soreness notable in bilateral SIJ with limited joint mobility.    PT Next Visit Plan abdominal bracing, SIJ mobs, bike   PT Home Exercise Plan hooklying LE ADD/ABD, LTR, AHSS, prone glute sets   Consulted and Agree with Plan of Care Patient;Family member/caregiver   Family Member Consulted Husband      Patient will benefit from skilled therapeutic intervention in order to improve the following deficits and impairments:     Visit Diagnosis: Acute  left-sided low back pain, with sciatica presence unspecified  Difficulty in walking, not elsewhere classified  Pain in left leg     Problem List Patient Active Problem List   Diagnosis  Date Noted  . Paroxysmal atrial fibrillation ([place]) 10/22/2015  . Acute on chronic kidney failure ([place]) 09/10/2015  . Acute lacunar infarction ([place]) 09/03/2015  . Sinusitis, chronic 08/05/2015  . Weight loss, unintentional 07/02/2015  . Diabetes mellitus type 2 with neurological manifestations ([place]) 02/26/2014  . Hyperkalemia 06/21/2013  . Diabetic nephropathy ([place]) 05/23/2013  . Diabetic neuropathy ([place]) 11/30/2012  . Macular degeneration 07/04/2012  . Diabetic retinopathy ([place]) 03/20/2012  . Osteopenia 09/22/2011  . Foot pain 09/21/2011  . Anemia in chronic kidney disease 08/29/2011  . Gout 04/13/2011  . Stress incontinence, female 11/11/2010  . Anxiety 08/18/2010  . Chronic systolic heart failure ([place]) 02/18/2009  . SINUS BRADYCARDIA 10/29/2008  . Peripheral vascular disease ([place]) 06/18/2008  . Chronic kidney disease, stage IV (severe) ([place]) 07/13/2007  . Chronic lymphocytic leukemia ([place]) 10/13/2006  . 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 HYPERCHOLESTEROLEMIA 03/23/2006  . OBESITY, NOS 03/23/2006  . Essential hypertension 03/23/2006  . Coronary atherosclerosis 03/23/2006  . REFLUX ESOPHAGITIS 03/23/2006  . IRRITABLE BOWEL SYNDROME 03/23/2006  . CYSTOCELE/RECTOCELE/PROLAPSE,UNSPEC. 03/23/2006  . Osteoarthritis, multiple sites 03/23/2006   Athelene Hursey C. Maelyn Berrey PT, DPT 12/24/15 12:46 PM   [facility] [address] [place], [place], [address] Phone: [phone]   Fax:  [phone]  Name: Christine Santos MRN: [id number] Date of Birth: June 27, 1945

## 2015-12-28 ENCOUNTER — Ambulatory Visit: Payer: Medicare Other | Attending: Family Medicine | Admitting: Physical Therapy

## 2015-12-28 DIAGNOSIS — M79605 Pain in left leg: Secondary | ICD-10-CM | POA: Diagnosis not present

## 2015-12-28 DIAGNOSIS — M545 Low back pain: Secondary | ICD-10-CM

## 2015-12-28 DIAGNOSIS — R262 Difficulty in walking, not elsewhere classified: Secondary | ICD-10-CM | POA: Diagnosis not present

## 2015-12-28 NOTE — Patient Instructions (Signed)
Lower trunk rotation 10 X to the right only.  Issued from the exercise drawer.

## 2015-12-28 NOTE — Therapy (Signed)
[name], [place], [address] Phone: [phone]   Fax:  [phone]  Physical Therapy Treatment  Patient Details  Name: Christine Santos MRN: [id number] Date of Birth: [birth year] Referring Provider: Forrest Moron, MD  Encounter Date: 12/28/2015      PT End of Session - 12/28/15 1524    Visit Number 3   Number of Visits 11   Date for PT Re-Evaluation 01/29/16   PT Start Time 1330   PT Stop Time 1415   PT Time Calculation (min) 45 min   Behavior During Therapy [facility] for tasks assessed/performed      Past Medical History:  Diagnosis Date  . Anemia 08/29/2011  . Anxiety   . Arthritis    "legs" (01/22/2013)  . CAD (coronary artery disease)    s/p CABG in 1999  . Cataract   . Chronic kidney disease   . CLL (chronic lymphoblastic leukemia)   . Diastolic heart failure ([place])   . GERD (gastroesophageal reflux disease)   . H/O hiatal hernia   . Heart murmur   . Hyperlipidemia   . Hypertension   . IDDM (insulin dependent diabetes mellitus) ([place])   . PAD (peripheral artery disease) ([place])   . Paroxysmal atrial fibrillation (HCC)    a. identified on ILR as part of StrokeAF study  . Peripheral vascular disease ([place])   . Stroke (cerebrum) [facility])     Past Surgical History:  Procedure Laterality Date  . ANGIOPLASTY / STENTING FEMORAL Right 09/2010   SFA/notes 11/25/2010 (01/22/2013)  . ANGIOPLASTY / STENTING FEMORAL Left 11/2010   SFA/notes 11/25/2010 (01/22/2013)  . ANGIOPLASTY / STENTING ILIAC     [name] 11/25/2010 (01/22/2013)  . CATARACT EXTRACTION W/ INTRAOCULAR LENS  IMPLANT, BILATERAL Bilateral ?2011  . CHOLECYSTECTOMY  1993  . CORONARY ARTERY BYPASS GRAFT  10/1994   "CABG X3"  . EP IMPLANTABLE DEVICE N/A 09/11/2015   Procedure: Loop Recorder Insertion;  Surgeon: Thompson Grayer, MD;  Location: [place] [facility] LAB;  Service: Cardiovascular;  Laterality: N/A;  . HEEL SPUR EXCISION Bilateral 1970's  .  LOWER EXTREMITY ANGIOGRAM N/A 01/22/2013   Procedure: LOWER EXTREMITY ANGIOGRAM;  Surgeon: [name], MD;  Location: [facility] CATH LAB;  Service: Cardiovascular;  Laterality: N/A;  . SHOULDER OPEN ROTATOR CUFF REPAIR Bilateral 1990's   "2 times on 1 side; once on the other"  . TRANSLUMINAL ATHERECTOMY FEMORAL ARTERY Right 01/22/2013   & balloon  . TUBAL LIGATION  1980's    There were no vitals filed for this visit.      Subjective Assessment - 12/28/15 1406    Subjective 7-8/ pain   Pain Location Back   Pain Orientation Left;Posterior;Lateral   Pain Descriptors / Indicators Aching;Sore  feels like it is a vice   Pain Type Acute pain   Pain Radiating Towards low back/hip left   Aggravating Factors  walking, sitting   Pain Relieving Factors lidocane cream   Multiple Pain Sites No                         [facility] Adult PT Treatment/Exercise - 12/28/15 0001      Lumbar Exercises: Stretches   Lower Trunk Rotation 5 reps   Lower Trunk Rotation Limitations 10 seconds to left only   Pelvic Tilt 5 reps  cues, 5 seconds   Passive Hamstring Stretch 3 reps;30 seconds     Knee/Hip Exercises: Supine   Constance Haw  Limitations 10 X   Straight Leg Raises 10 reps  right only   Knee Flexion 5 reps   Knee Flexion Limitations Isometric right only , 5 seconds   Other Supine Knee/Hip Exercises isometric clame 10 x 5 seconds  ADD squeezes painful lateral hip 2X  so stopped.     Manual Therapy   Manual therapy comments soft tissue work lateral hip , muscle energy for quads                PT Education - 12/28/15 1524    Education provided Yes   Education Details HEP   Person(s) Educated Patient   Methods Explanation;Verbal cues;Handout   Comprehension Verbalized understanding;Returned demonstration          PT Short Term Goals - 12/28/15 1527      PT SHORT TERM GOAL #1   Title Pt will verbalize resolution of sharp pain when standing by 12/22   Baseline  sharp pain every time she stands   Time 3   Period Weeks   Status On-going     PT SHORT TERM GOAL #2   Title Pt will verbalize ability to utilize stretches and exercises to decrease pain throughout the day   Baseline does not remember to do   Time 3   Period Weeks   Status On-going           PT Long Term Goals - 12/21/15 1037      PT LONG TERM GOAL #1   Title FOTO to 56% ability to indicate significant functional ability by 01/29/16   Baseline 31% ability at eval   Time 6   Period Weeks   Status New     PT LONG TERM GOAL #2   Title pt will be able to walk through [employer] without limitation by pain to return to PLOF   Baseline  unable at eval   Time 6   Period Weeks   Status New     PT LONG TERM GOAL #3   Title Pt will verbalize max pain <=2/10 during functional daily activities   Baseline up to 7/10 at eval   Time 6   Period Weeks   Status New     PT LONG TERM GOAL #4   Title Pt will verbalize resolution of sensation that she is going to fall due to SIJ pain   Baseline feels like she is going to fall when pain hits upon standing   Time 6   Period Weeks   Status New               Plan - 12/28/15 1525    Clinical Impression Statement pain decreased to 4/10 after exercise,  Focus on lowering Left ASIS.  Gait improved post session.  Progress toward exercise goals.    PT Next Visit Plan abdominal bracing, SIJ mobs, bike   PT Home Exercise Plan hooklying LE ADD/ABD, LTR, AHSS, prone glute sets 12/4: LTR to left   Consulted and Agree with Plan of Care Patient;Family member/caregiver   Family Member Consulted Husband      Patient will benefit from skilled therapeutic intervention in order to improve the following deficits and impairments:  Difficulty walking, Pain, Increased muscle spasms, Decreased activity tolerance, Improper body mechanics, Postural dysfunction, Decreased strength  Visit Diagnosis: Acute left-sided low back pain, with sciatica presence  unspecified  Difficulty in walking, not elsewhere classified  Pain in left leg     Problem List Patient Active Problem List  Diagnosis Date Noted  . Paroxysmal atrial fibrillation ([place]) 10/22/2015  . Acute on chronic kidney failure ([place]) 09/10/2015  . Acute lacunar infarction ([place]) 09/03/2015  . Sinusitis, chronic 08/05/2015  . Weight loss, unintentional 07/02/2015  . Diabetes mellitus type 2 with neurological manifestations ([place]) 02/26/2014  . Hyperkalemia 06/21/2013  . Diabetic nephropathy ([place]) 05/23/2013  . Diabetic neuropathy ([place]) 11/30/2012  . Macular degeneration 07/04/2012  . Diabetic retinopathy ([place]) 03/20/2012  . Osteopenia 09/22/2011  . Foot pain 09/21/2011  . Anemia in chronic kidney disease 08/29/2011  . Gout 04/13/2011  . Stress incontinence, female 11/11/2010  . Anxiety 08/18/2010  . Chronic systolic heart failure ([place]) 02/18/2009  . SINUS BRADYCARDIA 10/29/2008  . Peripheral vascular disease ([place]) 06/18/2008  . Chronic kidney disease, stage IV (severe) ([place]) 07/13/2007  . Chronic lymphocytic leukemia ([place]) 10/13/2006  . 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 HYPERCHOLESTEROLEMIA 03/23/2006  . OBESITY, NOS 03/23/2006  . Essential hypertension 03/23/2006  . Coronary atherosclerosis 03/23/2006  . REFLUX ESOPHAGITIS 03/23/2006  . IRRITABLE BOWEL SYNDROME 03/23/2006  . CYSTOCELE/RECTOCELE/PROLAPSE,UNSPEC. 03/23/2006  . Osteoarthritis, multiple sites 03/23/2006    [facility] PTA 12/28/2015, 3:28 PM  [facility] [address] [place], [place], [address] Phone: [phone]   Fax:  [phone]  Name: YARELIE HAMS MRN: [id number] Date of Birth: 1945-08-29

## 2015-12-31 ENCOUNTER — Ambulatory Visit: Payer: Medicare Other | Admitting: Physical Therapy

## 2015-12-31 ENCOUNTER — Encounter: Payer: Self-pay | Admitting: Physical Therapy

## 2015-12-31 DIAGNOSIS — R262 Difficulty in walking, not elsewhere classified: Secondary | ICD-10-CM

## 2015-12-31 DIAGNOSIS — M545 Low back pain: Secondary | ICD-10-CM | POA: Diagnosis not present

## 2015-12-31 DIAGNOSIS — M79605 Pain in left leg: Secondary | ICD-10-CM

## 2015-12-31 NOTE — Therapy (Signed)
[place], [place], [address] Phone: [phone]   Fax:  [phone]  Physical Therapy Treatment  Patient Details  Name: Christine Santos MRN: [id number] Date of Birth: Jul 06, 1945 Referring Provider: Forrest Moron, MD  Encounter Date: 12/31/2015      PT End of Session - 12/31/15 1336    Visit Number 4   Number of Visits 11   Date for PT Re-Evaluation 01/29/16   Authorization Type BCBS MCR- KX at visit 15   PT Start Time 1333   PT Stop Time 1420   PT Time Calculation (min) 47 min   Activity Tolerance Patient tolerated treatment well   Behavior During Therapy [facility] for tasks assessed/performed      Past Medical History:  Diagnosis Date  . Anemia 08/29/2011  . Anxiety   . Arthritis    "legs" (01/22/2013)  . CAD (coronary artery disease)    s/p CABG in 1999  . Cataract   . Chronic kidney disease   . CLL (chronic lymphoblastic leukemia)   . Diastolic heart failure ([place])   . GERD (gastroesophageal reflux disease)   . H/O hiatal hernia   . Heart murmur   . Hyperlipidemia   . Hypertension   . IDDM (insulin dependent diabetes mellitus) ([place])   . PAD (peripheral artery disease) ([place])   . Paroxysmal atrial fibrillation (HCC)    a. identified on ILR as part of StrokeAF study  . Peripheral vascular disease ([place])   . Stroke (cerebrum) [facility])     Past Surgical History:  Procedure Laterality Date  . ANGIOPLASTY / STENTING FEMORAL Right 09/2010   SFA/notes 11/25/2010 (01/22/2013)  . ANGIOPLASTY / STENTING FEMORAL Left 11/2010   SFA/notes 11/25/2010 (01/22/2013)  . ANGIOPLASTY / STENTING ILIAC     [name] 11/25/2010 (01/22/2013)  . CATARACT EXTRACTION W/ INTRAOCULAR LENS  IMPLANT, BILATERAL Bilateral ?2011  . CHOLECYSTECTOMY  1993  . CORONARY ARTERY BYPASS GRAFT  10/1994   "CABG X3"  . EP IMPLANTABLE DEVICE N/A 09/11/2015   Procedure: Loop Recorder Insertion;  Surgeon: Thompson Grayer, MD;  Location: [place] [facility];  Service: Cardiovascular;  Laterality: N/A;  . HEEL SPUR EXCISION Bilateral 1970's  . LOWER EXTREMITY ANGIOGRAM N/A 01/22/2013   Procedure: LOWER EXTREMITY ANGIOGRAM;  Surgeon: [name], MD;  Location: [facility] CATH LAB;  Service: Cardiovascular;  Laterality: N/A;  . SHOULDER OPEN ROTATOR CUFF REPAIR Bilateral 1990's   "2 times on 1 side; once on the other"  . TRANSLUMINAL ATHERECTOMY FEMORAL ARTERY Right 01/22/2013   & balloon  . TUBAL LIGATION  1980's    There were no vitals filed for this visit.      Subjective Assessment - 12/31/15 1336    Subjective Pt reports pain is more localized to L buttock. Feels like it is burning "on the bone"   Currently in Pain? Yes   Pain Score 5    Pain Location Buttocks   Pain Orientation Left                         [facility] Adult PT Treatment/Exercise - 12/31/15 0001      Knee/Hip Exercises: Stretches   Active Hamstring Stretch 5 reps   Piriformis Stretch 2 reps;20 seconds     Knee/Hip Exercises: Aerobic   Nustep 5 min L3     Knee/Hip Exercises: Seated   Long Arc Quad 10 reps;Both   Long [employer] Limitations holding ball  bw knees     Knee/Hip Exercises: Supine   Bridges with Ball Squeeze 20 reps  in DF   Straight Leg Raises Both;15 reps  cues for abdominal engagement     Cryotherapy   Number Minutes Cryotherapy 10 Minutes  3 min concurrent with POC  and HEP discussion   Cryotherapy Location Other (comment)  under L ischial tuberosity   Type of Cryotherapy Ice pack     Manual Therapy   Soft tissue mobilization IASTM L hip external rotators, proximal hamstring   Manual Traction LLE sustained long axis traction                PT Education - 12/31/15 1408    Education provided Yes   Education Details exercise form/rationale, HEP, importance of exercise, used model to show anatomy of condition   Person(s) Educated Patient;Spouse   Methods Explanation;Demonstration;Tactile cues;Verbal  cues;Handout   Comprehension Verbalized understanding;Returned demonstration;Verbal cues required;Tactile cues required;Need further instruction          PT Short Term Goals - 12/28/15 1527      PT SHORT TERM GOAL #1   Title Pt will verbalize resolution of sharp pain when standing by 12/22   Baseline sharp pain every time she stands   Time 3   Period Weeks   Status On-going     PT SHORT TERM GOAL #2   Title Pt will verbalize ability to utilize stretches and exercises to decrease pain throughout the day   Baseline does not remember to do   Time 3   Period Weeks   Status On-going           PT Long Term Goals - 12/21/15 1037      PT LONG TERM GOAL #1   Title FOTO to 56% ability to indicate significant functional ability by 01/29/16   Baseline 31% ability at eval   Time 6   Period Weeks   Status New     PT LONG TERM GOAL #2   Title pt will be able to walk through [employer] without limitation by pain to return to PLOF   Baseline  unable at eval   Time 6   Period Weeks   Status New     PT LONG TERM GOAL #3   Title Pt will verbalize max pain <=2/10 during functional daily activities   Baseline up to 7/10 at eval   Time 6   Period Weeks   Status New     PT LONG TERM GOAL #4   Title Pt will verbalize resolution of sensation that she is going to fall due to SIJ pain   Baseline feels like she is going to fall when pain hits upon standing   Time 6   Period Weeks   Status New               Plan - 12/31/15 1413    Clinical Impression Statement Notable concordant pain localized to L ischial tuberosity. Localization of pain following manual treatment to surrounding muscles. Cont to stretch and strengthen surrounding musculature to decrease pull and inflamation at ischial tuberosity.   PT Next Visit Plan abdominal bracing, SIJ mobs, bike   PT Home Exercise Plan hooklying LE ADD/ABD, LTR, AHSS, prone glute sets 12/4: LTR to left   Consulted and Agree with Plan of  Care Patient;Family member/caregiver   Family Member Consulted Husband      Patient will benefit from skilled therapeutic intervention in order to improve the following  deficits and impairments:     Visit Diagnosis: Acute left-sided low back pain, with sciatica presence unspecified  Difficulty in walking, not elsewhere classified  Pain in left leg     Problem List Patient Active Problem List   Diagnosis Date Noted  . Paroxysmal atrial fibrillation ([place]) 10/22/2015  . Acute on chronic kidney failure ([place]) 09/10/2015  . Acute lacunar infarction ([place]) 09/03/2015  . Sinusitis, chronic 08/05/2015  . Weight loss, unintentional 07/02/2015  . Diabetes mellitus type 2 with neurological manifestations ([place]) 02/26/2014  . Hyperkalemia 06/21/2013  . Diabetic nephropathy ([place]) 05/23/2013  . Diabetic neuropathy ([place]) 11/30/2012  . Macular degeneration 07/04/2012  . Diabetic retinopathy ([place]) 03/20/2012  . Osteopenia 09/22/2011  . Foot pain 09/21/2011  . Anemia in chronic kidney disease 08/29/2011  . Gout 04/13/2011  . Stress incontinence, female 11/11/2010  . Anxiety 08/18/2010  . Chronic systolic heart failure ([place]) 02/18/2009  . SINUS BRADYCARDIA 10/29/2008  . Peripheral vascular disease ([place]) 06/18/2008  . Chronic kidney disease, stage IV (severe) ([place]) 07/13/2007  . Chronic lymphocytic leukemia ([place]) 10/13/2006  . 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 HYPERCHOLESTEROLEMIA 03/23/2006  . OBESITY, NOS 03/23/2006  . Essential hypertension 03/23/2006  . Coronary atherosclerosis 03/23/2006  . REFLUX ESOPHAGITIS 03/23/2006  . IRRITABLE BOWEL SYNDROME 03/23/2006  . CYSTOCELE/RECTOCELE/PROLAPSE,UNSPEC. 03/23/2006  . Osteoarthritis, multiple sites 03/23/2006    Silvio Sausedo C. Alexus Michael PT, DPT 12/31/15 3:02 PM   [facility] [address] [place], [place], [address] Phone: [phone]   Fax:   [phone]  Name: KEONDRIA SIEVER MRN: [id number] Date of Birth: [birth year]

## 2016-01-04 ENCOUNTER — Ambulatory Visit: Payer: Medicare Other | Admitting: Physical Therapy

## 2016-01-04 ENCOUNTER — Encounter: Payer: Self-pay | Admitting: Physical Therapy

## 2016-01-04 ENCOUNTER — Encounter: Payer: Medicare Other | Admitting: Physical Therapy

## 2016-01-04 DIAGNOSIS — M79605 Pain in left leg: Secondary | ICD-10-CM

## 2016-01-04 DIAGNOSIS — R262 Difficulty in walking, not elsewhere classified: Secondary | ICD-10-CM

## 2016-01-04 DIAGNOSIS — M545 Low back pain: Secondary | ICD-10-CM

## 2016-01-04 NOTE — Therapy (Signed)
[place], [place], [address] Phone: [phone]   Fax:  [phone]  Physical Therapy Treatment  Patient Details  Name: Christine Santos MRN: [id number] Date of Birth: Oct 24, 1945 Referring Provider: Forrest Moron, MD  Encounter Date: 01/04/2016      PT End of Session - 01/04/16 1548    Visit Number 5   Number of Visits 11   Date for PT Re-Evaluation 01/29/16   Authorization Type BCBS MCR- KX at visit 15   PT Start Time 1545   PT Stop Time 1637   PT Time Calculation (min) 52 min   Activity Tolerance Patient tolerated treatment well   Behavior During Therapy [facility] for tasks assessed/performed      Past Medical History:  Diagnosis Date  . Anemia 08/29/2011  . Anxiety   . Arthritis    "legs" (01/22/2013)  . CAD (coronary artery disease)    s/p CABG in 1999  . Cataract   . Chronic kidney disease   . CLL (chronic lymphoblastic leukemia)   . Diastolic heart failure ([place])   . GERD (gastroesophageal reflux disease)   . H/O hiatal hernia   . Heart murmur   . Hyperlipidemia   . Hypertension   . IDDM (insulin dependent diabetes mellitus) ([place])   . PAD (peripheral artery disease) ([place])   . Paroxysmal atrial fibrillation (HCC)    a. identified on ILR as part of StrokeAF study  . Peripheral vascular disease ([place])   . Stroke (cerebrum) [facility])     Past Surgical History:  Procedure Laterality Date  . ANGIOPLASTY / STENTING FEMORAL Right 09/2010   SFA/notes 11/25/2010 (01/22/2013)  . ANGIOPLASTY / STENTING FEMORAL Left 11/2010   SFA/notes 11/25/2010 (01/22/2013)  . ANGIOPLASTY / STENTING ILIAC     [name] 11/25/2010 (01/22/2013)  . CATARACT EXTRACTION W/ INTRAOCULAR LENS  IMPLANT, BILATERAL Bilateral ?2011  . CHOLECYSTECTOMY  1993  . CORONARY ARTERY BYPASS GRAFT  10/1994   "CABG X3"  . EP IMPLANTABLE DEVICE N/A 09/11/2015   Procedure: Loop Recorder Insertion;  Surgeon: Thompson Grayer, MD;  Location: [place] CV LAB;  Service: Cardiovascular;  Laterality: N/A;  . HEEL SPUR EXCISION Bilateral 1970's  . LOWER EXTREMITY ANGIOGRAM N/A 01/22/2013   Procedure: LOWER EXTREMITY ANGIOGRAM;  Surgeon: [name], MD;  Location: [facility] CATH LAB;  Service: Cardiovascular;  Laterality: N/A;  . SHOULDER OPEN ROTATOR CUFF REPAIR Bilateral 1990's   "2 times on 1 side; once on the other"  . TRANSLUMINAL ATHERECTOMY FEMORAL ARTERY Right 01/22/2013   & balloon  . TUBAL LIGATION  1980's    There were no vitals filed for this visit.      Subjective Assessment - 01/04/16 1549    Subjective Sometimes it hurts when she sits on it, sometimes it doesn't. Stiffness in the morning   Patient Stated Goals decrease pain, walk   Currently in Pain? Yes   Pain Score 5    Pain Location Buttocks   Pain Orientation Left   Pain Descriptors / Indicators Sharp   Aggravating Factors  sitting    Multiple Pain Sites No                         [facility] Adult PT Treatment/Exercise - 01/04/16 0001      Knee/Hip Exercises: Stretches   Active Hamstring Stretch Both   Active Hamstring Stretch Limitations x10 each, cues for Df   Piriformis Stretch 2 reps;20  seconds;Both   Gastroc Stretch Limitations slant board 30s   Soleus Stretch Limitations slant board 30s     Knee/Hip Exercises: Aerobic   Nustep 7 min L5     Knee/Hip Exercises: Standing   Heel Raises 10 reps   SLS bilateral UE support   Other Standing Knee Exercises tandem stance, bilat UE support     Knee/Hip Exercises: Seated   Sit to Sand 10 reps  ball bw knees     Knee/Hip Exercises: Supine   Bridges with Clamshell 10 reps;2 sets  red tband   Straight Leg Raises Both;10 reps  cues for abdominal engagement/flat back   Other Supine Knee/Hip Exercises hooklying ball squeeze 3s holds     Cryotherapy   Number Minutes Cryotherapy 10 Minutes   Cryotherapy Location Other (comment)  L ischial tuberosity                PT  Education - 01/04/16 1551    Education provided Yes   Education Details exercise form/rationale, HEP   Person(s) Educated Patient   Methods Explanation;Demonstration;Tactile cues;Verbal cues   Comprehension Verbalized understanding;Returned demonstration;Verbal cues required;Tactile cues required;Need further instruction          PT Short Term Goals - 12/28/15 1527      PT SHORT TERM GOAL #1   Title Pt will verbalize resolution of sharp pain when standing by 12/22   Baseline sharp pain every time she stands   Time 3   Period Weeks   Status On-going     PT SHORT TERM GOAL #2   Title Pt will verbalize ability to utilize stretches and exercises to decrease pain throughout the day   Baseline does not remember to do   Time 3   Period Weeks   Status On-going           PT Long Term Goals - 12/21/15 1037      PT LONG TERM GOAL #1   Title FOTO to 56% ability to indicate significant functional ability by 01/29/16   Baseline 31% ability at eval   Time 6   Period Weeks   Status New     PT LONG TERM GOAL #2   Title pt will be able to walk through [employer] without limitation by pain to return to PLOF   Baseline  unable at eval   Time 6   Period Weeks   Status New     PT LONG TERM GOAL #3   Title Pt will verbalize max pain <=2/10 during functional daily activities   Baseline up to 7/10 at eval   Time 6   Period Weeks   Status New     PT LONG TERM GOAL #4   Title Pt will verbalize resolution of sensation that she is going to fall due to SIJ pain   Baseline feels like she is going to fall when pain hits upon standing   Time 6   Period Weeks   Status New               Plan - 01/04/16 1728    Clinical Impression Statement Able to sit following treatment today without pain. Continued strengthening of gluts and stretching of hips and hamstrings will continue to decrease ischial tuberosity pain.    PT Next Visit Plan abdominal bracing, SIJ mobs, bike   PT Home  Exercise Plan hooklying LE ADD/ABD, LTR, AHSS, prone glute sets 12/4: LTR to left   Consulted and Agree with Plan of  Care Patient;Family member/caregiver      Patient will benefit from skilled therapeutic intervention in order to improve the following deficits and impairments:     Visit Diagnosis: Acute left-sided low back pain, with sciatica presence unspecified  Difficulty in walking, not elsewhere classified  Pain in left leg     Problem List Patient Active Problem List   Diagnosis Date Noted  . Paroxysmal atrial fibrillation ([place]) 10/22/2015  . Acute on chronic kidney failure ([place]) 09/10/2015  . Acute lacunar infarction ([place]) 09/03/2015  . Sinusitis, chronic 08/05/2015  . Weight loss, unintentional 07/02/2015  . Diabetes mellitus type 2 with neurological manifestations ([place]) 02/26/2014  . Hyperkalemia 06/21/2013  . Diabetic nephropathy ([place]) 05/23/2013  . Diabetic neuropathy ([place]) 11/30/2012  . Macular degeneration 07/04/2012  . Diabetic retinopathy ([place]) 03/20/2012  . Osteopenia 09/22/2011  . Foot pain 09/21/2011  . Anemia in chronic kidney disease 08/29/2011  . Gout 04/13/2011  . Stress incontinence, female 11/11/2010  . Anxiety 08/18/2010  . Chronic systolic heart failure ([place]) 02/18/2009  . SINUS BRADYCARDIA 10/29/2008  . Peripheral vascular disease ([place]) 06/18/2008  . Chronic kidney disease, stage IV (severe) ([place]) 07/13/2007  . Chronic lymphocytic leukemia ([place]) 10/13/2006  . 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 HYPERCHOLESTEROLEMIA 03/23/2006  . OBESITY, NOS 03/23/2006  . Essential hypertension 03/23/2006  . Coronary atherosclerosis 03/23/2006  . REFLUX ESOPHAGITIS 03/23/2006  . IRRITABLE BOWEL SYNDROME 03/23/2006  . CYSTOCELE/RECTOCELE/PROLAPSE,UNSPEC. 03/23/2006  . Osteoarthritis, multiple sites 03/23/2006  Magdaline Zollars C. Ryott Rafferty PT, DPT 01/04/16 5:30 PM   [facility] [address] [place], [place], [address] Phone: [phone]   Fax:  [phone]  Name: LAKRESHA STIFTER MRN: [id number] Date of Birth: [birth year]

## 2016-01-05 DIAGNOSIS — N2581 Secondary hyperparathyroidism of renal origin: Secondary | ICD-10-CM | POA: Diagnosis not present

## 2016-01-05 DIAGNOSIS — C9110 Chronic lymphocytic leukemia of B-cell type not having achieved remission: Secondary | ICD-10-CM | POA: Diagnosis not present

## 2016-01-05 DIAGNOSIS — R600 Localized edema: Secondary | ICD-10-CM | POA: Diagnosis not present

## 2016-01-05 DIAGNOSIS — I10 Essential (primary) hypertension: Secondary | ICD-10-CM | POA: Diagnosis not present

## 2016-01-05 DIAGNOSIS — N184 Chronic kidney disease, stage 4 (severe): Secondary | ICD-10-CM | POA: Diagnosis not present

## 2016-01-05 DIAGNOSIS — I639 Cerebral infarction, unspecified: Secondary | ICD-10-CM | POA: Diagnosis not present

## 2016-01-06 ENCOUNTER — Ambulatory Visit (INDEPENDENT_AMBULATORY_CARE_PROVIDER_SITE_OTHER): Payer: Medicare Other | Admitting: Pharmacist

## 2016-01-06 VITALS — BP 138/68 | HR 61

## 2016-01-06 DIAGNOSIS — I10 Essential (primary) hypertension: Secondary | ICD-10-CM

## 2016-01-06 MED ORDER — HYDRALAZINE HCL 50 MG PO TABS: 50.0000 mg | ORAL_TABLET | Freq: Three times a day (TID) | ORAL | 11 refills | Status: DC

## 2016-01-06 NOTE — Progress Notes (Signed)
Patient ID: Christine Santos                 DOB: 04-Nov-1945                      MRN: [id number]     HPI: Christine Santos is a 70 y.o. female patient of Dr. Irish Lack referred by Lyda Jester, PA to HTN clinic. PMH of CAD (s/p CABG 1996), HTN, DM, CKD IV, chronic diastolic dysfunction (EF 86-76%), Afib and recent stroke (3 months ago). She came for a OV on 11/09 complaining of elevated SBP readings in the 180-200s mmHg, but denied symptoms of headache, chest pain, dyspnea, dizziness, and lightheadedness. Blood pressures over the past 6 months ranged between 140s-210/50-80s mmHg. Clinic blood pressure reading was 140-150/80-90s mmHg and HR mid 50s.  Hydralazine 12.5 mg TID was added to her current regimen of clonidine, lopressor, torsemide, and amlodipine. Patient was on lisinopril until this past August, but was d/c'd due to rise in SCr. Recent BMET of 11/01 showed elevated Scr of 3.29 (baseline 2.89) and BUN 46. At last HTN visit, BP was at goal but pt was experiencing dry mouth from clonidine and some BP spikes from missing doses of clonidine. Clonidine dose was tapered down to 0.2mg  TID and hydralazine was increased to 25mg  TID to maintain BP. Patient presents to clinic today for follow up and further clonidine titration.  Pt comes to clinic with her sister and a friend. Her sister helps her with medications. Pt saw her renal doctor yesterday and reports that her SCr improved from 3.5 to 3. Her BP at the visit was 128/80. At home, her BP was 138/68 when checked recently. She has not noticed that her dry mouth has improved significantly. Of note, she has a full bottle of clonidine 0.3mg  tablets still left at home. Will use these for the rest of her clonidine taper.  Current HTN meds: torsemide 100 mg daily, lopressor 12.5 mg BID, hydralazine 25 mg TID, clonidine 0.2 mg TID, amlodipine 10 mg daily Previously tried: lisinopril 40 mg daily BP goal: <130/80 mmHg (new HTN guidelines)  Social History:  Never smoker, denies alcohol and illicit drug use.   Diet: endorses dietary indiscretions. Does not use salt at home and stays from packaged foods that have salt in them. Limits caffeinated coffee to one cup.   Exercise: Hasn't exercised lately due to pain in lower back. Wants to start working out at [employer].  Home BP readings: SBP 140-150s mmHg (after meds), but does drop to SBP 120s mmHg at times  Wt Readings from Last 3 Encounters:  12/12/15 185 lb 6.4 oz (84.1 kg)  12/03/15 187 lb 12.8 oz (85.2 kg)  12/02/15 186 lb 6.4 oz (84.6 kg)   BP Readings from Last 3 Encounters:  12/21/15 130/68  12/12/15 128/80  12/03/15 (!) 146/80   Pulse Readings from Last 3 Encounters:  12/21/15 77  12/12/15 82  12/03/15 (!) 51    Renal function: CrCl cannot be calculated (Patient's most recent lab result is older than the maximum 21 days allowed.).  Past Medical History:  Diagnosis Date  . Anemia 08/29/2011  . Anxiety   . Arthritis    "legs" (01/22/2013)  . CAD (coronary artery disease)    s/p CABG in 1999  . Cataract   . Chronic kidney disease   . CLL (chronic lymphoblastic leukemia)   . Diastolic heart failure ([place])   . GERD (gastroesophageal reflux disease)   .  H/O hiatal hernia   . Heart murmur   . Hyperlipidemia   . Hypertension   . IDDM (insulin dependent diabetes mellitus) ([place])   . PAD (peripheral artery disease) ([place])   . Paroxysmal atrial fibrillation (HCC)    a. identified on ILR as part of StrokeAF study  . Peripheral vascular disease ([place])   . Stroke (cerebrum) [facility])     Current Outpatient Prescriptions on File Prior to Visit  Medication Sig Dispense Refill  . allopurinol (ZYLOPRIM) 100 MG tablet Take 1 tablet (100 mg total) by mouth daily. 90 tablet 3  . amLODipine (NORVASC) 10 MG tablet TAKE 1 TABLET(10 MG) BY MOUTH DAILY 90 tablet 3  . apixaban (ELIQUIS) 5 MG TABS tablet Take 1 tablet (5 mg total) by mouth 2 (two) times daily. 60 tablet 11  . Ascorbic  Acid (VITAMIN C) 100 MG tablet Take 100 mg by mouth daily.    Marland Kitchen atorvastatin (LIPITOR) 80 MG tablet TAKE 1 TABLET BY MOUTH DAILY 90 tablet 2  . carboxymethylcellulose (REFRESH PLUS) 0.5 % SOLN 1 drop 3 (three) times daily as needed.    . cloNIDine (CATAPRES) 0.2 MG tablet Take 1 tablet (0.2 mg total) by mouth 3 (three) times daily. 90 tablet 0  . fenofibrate (TRICOR) 48 MG tablet Take 1 tablet (48 mg total) by mouth daily. 90 tablet 3  . ferrous sulfate 325 (65 FE) MG tablet Take 1 tablet (325 mg total) by mouth daily with breakfast.  3  . fluticasone (FLONASE) 50 MCG/ACT nasal spray Place 2 sprays into both nostrils daily. 16 g 6  . GNP GARLIC EXTRACT PO Take 1 tablet by mouth daily.     Marland Kitchen HUMALOG KWIKPEN 100 UNIT/ML KiwkPen ADMINISTER 5 UNITS UNDER THE SKIN THREE TIMES DAILY BEFORE MEALS 15 mL 12  . hydrALAZINE (APRESOLINE) 25 MG tablet Take 1 tablet (25 mg total) by mouth 3 (three) times daily. 135 tablet 3  . HYDROcodone-acetaminophen (NORCO) 5-325 MG tablet Take 1 tablet by mouth every 6 (six) hours as needed for moderate pain. (Patient not taking: Reported on 12/21/2015) 10 tablet 0  . insulin lispro (HUMALOG KWIKPEN) 100 UNIT/ML KiwkPen ADM 5 UNITS [place] TID B MEALS    . LANTUS SOLOSTAR 100 UNIT/ML Solostar Pen ADMINISTER 35 UNITS UNDER THE SKIN DAILY 15 mL 12  . lidocaine (ASPERCREME W/LIDOCAINE) 4 % cream Apply 1 application topically 4 (four) times daily as needed. 30 g 0  . Melatonin 10 MG CAPS Take 10 mg by mouth at bedtime as needed (sleep).     . metoprolol tartrate (LOPRESSOR) 25 MG tablet TAKE 1/2 TABLET BY MOUTH TWICE DAILY 90 tablet 3  . Multiple Vitamins-Minerals (MULTIVITAMINS THER. W/MINERALS) TABS Take 1 tablet by mouth daily.      Marland Kitchen omega-3 acid ethyl esters (LOVAZA) 1 g capsule Take by mouth.    Marland Kitchen OVER THE COUNTER MEDICATION Take 1 tablet by mouth 2 (two) times daily. Macular Degenerative Eye Supplement    . pantoprazole (PROTONIX) 40 MG tablet TAKE 1 TABLET BY MOUTH DAILY 90  tablet 3  . PARoxetine (PAXIL) 20 MG tablet TAKE 1 TABLET BY MOUTH EVERY MORNING 90 tablet 3  . torsemide (DEMADEX) 100 MG tablet Take 100 mg by mouth daily.    . traMADol (ULTRAM) 50 MG tablet Take 50 mg by mouth every 6 (six) hours as needed.     No current facility-administered medications on file prior to visit.     Allergies  Allergen Reactions  . Amoxicillin  Rash    Has patient had a PCN reaction causing immediate rash, facial/tongue/throat swelling, SOB or lightheadedness with hypotension: No Has patient had a PCN reaction causing severe rash involving mucus membranes or skin necrosis: No Has patient had a PCN reaction that required hospitalization: No Has patient had a PCN reaction occurring within the last 10 years: No If all of the above answers are "NO", then may proceed with Cephalosporin use.   . Cephalexin Rash  . Codeine Phosphate Rash     Assessment/Plan:  1. Hypertension - BP is stable but will continue with clonidine taper to alleviate patient's dry mouth and occasional BP spikes from missed doses of clonidine. Since pt has leftover clonidine 0.3mg  tablets at home, will have her take 1/2 tablet twice a day for the next 2 weeks, then take 1/2 tablet daily for 1 week, then stop clonidine. Will increase hydralazine to 50mg  TID to keep BP at goal. Pt will follow up in clinic in 1 month for one last check after discontinuing clonidine.   Megan E. Supple, PharmD, CPP, [place] [address] [place], [place] [address] Phone: [phone]; Fax: [phone] 01/06/2016 2:47 PM

## 2016-01-06 NOTE — Patient Instructions (Addendum)
Stop taking 0.2mg  tablets of clonidine. We will continue to cut back your dose. Since you have 0.3mg  tablets, start taking 1/2 tablet (0.15mg ) TWICE a day for 2 weeks. Then take 1/2 tablet (0.15mg ) ONCE a day for 1 week. Then stop clonidine.  Increase hydralazine to 50mg  3 times a day. You can take 2 of your 25mg  tablets 3 times a day until you run out. Then pick up your prescription for the 50mg  tablets and start taking 1 tablet 3 times a day.  Recheck blood pressure in 1 month in clinic.

## 2016-01-07 ENCOUNTER — Encounter: Payer: Self-pay | Admitting: Physical Therapy

## 2016-01-07 ENCOUNTER — Ambulatory Visit: Payer: Medicare Other | Admitting: Physical Therapy

## 2016-01-07 DIAGNOSIS — R262 Difficulty in walking, not elsewhere classified: Secondary | ICD-10-CM

## 2016-01-07 DIAGNOSIS — M545 Low back pain: Secondary | ICD-10-CM

## 2016-01-07 DIAGNOSIS — M79605 Pain in left leg: Secondary | ICD-10-CM

## 2016-01-07 NOTE — Therapy (Signed)
[place], [place], [address] Phone: [phone]   Fax:  [phone]  Physical Therapy Treatment  Patient Details  Name: Christine Santos MRN: [id number] Date of Birth: Apr 14, 1945 Referring Provider: Forrest Moron, MD  Encounter Date: 01/07/2016      PT End of Session - 01/07/16 1422    Visit Number 6   Number of Visits 11   Date for PT Re-Evaluation 01/29/16   Authorization Type BCBS MCR- KX at visit 15   PT Start Time 1416   PT Stop Time 1507   PT Time Calculation (min) 51 min   Activity Tolerance Patient tolerated treatment well   Behavior During Therapy [facility] for tasks assessed/performed      Past Medical History:  Diagnosis Date  . Anemia 08/29/2011  . Anxiety   . Arthritis    "legs" (01/22/2013)  . CAD (coronary artery disease)    s/p CABG in 1999  . Cataract   . Chronic kidney disease   . CLL (chronic lymphoblastic leukemia)   . Diastolic heart failure ([place])   . GERD (gastroesophageal reflux disease)   . H/O hiatal hernia   . Heart murmur   . Hyperlipidemia   . Hypertension   . IDDM (insulin dependent diabetes mellitus) ([place])   . PAD (peripheral artery disease) ([place])   . Paroxysmal atrial fibrillation (HCC)    a. identified on ILR as part of StrokeAF study  . Peripheral vascular disease ([place])   . Stroke (cerebrum) [facility])     Past Surgical History:  Procedure Laterality Date  . ANGIOPLASTY / STENTING FEMORAL Right 09/2010   SFA/notes 11/25/2010 (01/22/2013)  . ANGIOPLASTY / STENTING FEMORAL Left 11/2010   SFA/notes 11/25/2010 (01/22/2013)  . ANGIOPLASTY / STENTING ILIAC     [name] 11/25/2010 (01/22/2013)  . CATARACT EXTRACTION W/ INTRAOCULAR LENS  IMPLANT, BILATERAL Bilateral ?2011  . CHOLECYSTECTOMY  1993  . CORONARY ARTERY BYPASS GRAFT  10/1994   "CABG X3"  . EP IMPLANTABLE DEVICE N/A 09/11/2015   Procedure: Loop Recorder Insertion;  Surgeon: Thompson Grayer, MD;  Location: [place] CV LAB;  Service: Cardiovascular;  Laterality: N/A;  . HEEL SPUR EXCISION Bilateral 1970's  . LOWER EXTREMITY ANGIOGRAM N/A 01/22/2013   Procedure: LOWER EXTREMITY ANGIOGRAM;  Surgeon: [name], MD;  Location[facility] CATH LAB;  Service: Cardiovascular;  Laterality: N/A;  . SHOULDER OPEN ROTATOR CUFF REPAIR Bilateral 1990's   "2 times on 1 side; once on the other"  . TRANSLUMINAL ATHERECTOMY FEMORAL ARTERY Right 01/22/2013   & balloon  . TUBAL LIGATION  1980's    There were no vitals filed for this visit.      Subjective Assessment - 01/07/16 1420    Subjective Has not had much trouble today when seated, (points around L SIJ) reports it feels like it is cramping around here. Still has to sit for a minute before getting out of bed in the morning but it is notably better.    Currently in Pain? Yes   Pain Score 5    Pain Location Sacrum   Pain Orientation Left                         [facility] Adult PT Treatment/Exercise - 01/07/16 0001      Knee/Hip Exercises: Stretches   Piriformis Stretch Limitations figure 4 bilat     Knee/Hip Exercises: Aerobic   Stationary Bike 5 min L3  Knee/Hip Exercises: Supine   Bridges with Ball Squeeze 10 reps;2 sets   Other Supine Knee/Hip Exercises hooklying ball squeeze 10x5s holds   Other Supine Knee/Hip Exercises hooklying clam blue Tband     Knee/Hip Exercises: Prone   Hip Extension Both;10 reps   Hip Extension Limitations with hamstring curl     Moist Heat Therapy   Number Minutes Moist Heat 10 Minutes   Moist Heat Location Lumbar Spine  L SIJ     Manual Therapy   Manual therapy comments educated in use of tennis ball at wall   Soft tissue mobilization IASTM L hip external rotators                  PT Short Term Goals - 12/28/15 1527      PT SHORT TERM GOAL #1   Title Pt will verbalize resolution of sharp pain when standing by 12/22   Baseline sharp pain every time she stands   Time 3    Period Weeks   Status On-going     PT SHORT TERM GOAL #2   Title Pt will verbalize ability to utilize stretches and exercises to decrease pain throughout the day   Baseline does not remember to do   Time 3   Period Weeks   Status On-going           PT Long Term Goals - 12/21/15 1037      PT LONG TERM GOAL #1   Title FOTO to 56% ability to indicate significant functional ability by 01/29/16   Baseline 31% ability at eval   Time 6   Period Weeks   Status New     PT LONG TERM GOAL #2   Title pt will be able to walk through [employer] without limitation by pain to return to PLOF   Baseline  unable at eval   Time 6   Period Weeks   Status New     PT LONG TERM GOAL #3   Title Pt will verbalize max pain <=2/10 during functional daily activities   Baseline up to 7/10 at eval   Time 6   Period Weeks   Status New     PT LONG TERM GOAL #4   Title Pt will verbalize resolution of sensation that she is going to fall due to SIJ pain   Baseline feels like she is going to fall when pain hits upon standing   Time 6   Period Weeks   Status New               Plan - 01/07/16 1457    Clinical Impression Statement Trigger points in Hip external rotators created concordant pain and were decreased with treatment, pt reported it still hurt following and was educated on soreness that follows trigger point release. Instructed to squeeze pillow bw knees and perofrm LTR before getting out of bed in the morning.    PT Next Visit Plan abdominal bracing, SIJ mobs, bike   Consulted and Agree with Plan of Care Patient;Family member/caregiver   Family Member Consulted Husband      Patient will benefit from skilled therapeutic intervention in order to improve the following deficits and impairments:     Visit Diagnosis: Acute left-sided low back pain, with sciatica presence unspecified  Difficulty in walking, not elsewhere classified  Pain in left leg     Problem List Patient Active  Problem List   Diagnosis Date Noted  . Paroxysmal atrial fibrillation (HCC)  10/22/2015  . Acute on chronic kidney failure ([place]) 09/10/2015  . Acute lacunar infarction ([place]) 09/03/2015  . Sinusitis, chronic 08/05/2015  . Weight loss, unintentional 07/02/2015  . Diabetes mellitus type 2 with neurological manifestations ([place]) 02/26/2014  . Hyperkalemia 06/21/2013  . Diabetic nephropathy ([place]) 05/23/2013  . Diabetic neuropathy ([place]) 11/30/2012  . Macular degeneration 07/04/2012  . Diabetic retinopathy ([place]) 03/20/2012  . Osteopenia 09/22/2011  . Foot pain 09/21/2011  . Anemia in chronic kidney disease 08/29/2011  . Gout 04/13/2011  . Stress incontinence, female 11/11/2010  . Anxiety 08/18/2010  . Chronic systolic heart failure ([place]) 02/18/2009  . SINUS BRADYCARDIA 10/29/2008  . Peripheral vascular disease ([place]) 06/18/2008  . Chronic kidney disease, stage IV (severe) ([place]) 07/13/2007  . Chronic lymphocytic leukemia ([place]) 10/13/2006  . 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 HYPERCHOLESTEROLEMIA 03/23/2006  . OBESITY, NOS 03/23/2006  . Essential hypertension 03/23/2006  . Coronary atherosclerosis 03/23/2006  . REFLUX ESOPHAGITIS 03/23/2006  . IRRITABLE BOWEL SYNDROME 03/23/2006  . CYSTOCELE/RECTOCELE/PROLAPSE,UNSPEC. 03/23/2006  . Osteoarthritis, multiple sites 03/23/2006    Brewer Hitchman C. Sho Salguero PT, DPT 01/07/16 3:00 PM   [place], [place], [address] Phone: [phone]   Fax:  [phone]  Name: Christine Santos MRN: [id number] Date of Birth: 02-05-1945

## 2016-01-11 ENCOUNTER — Ambulatory Visit: Payer: Medicare Other | Admitting: Physical Therapy

## 2016-01-11 ENCOUNTER — Telehealth: Payer: Self-pay | Admitting: Cardiology

## 2016-01-11 NOTE — Telephone Encounter (Signed)
Returned pts call.  She states that she is being weaned off of Clonidine and has been put on Hydralazine. On Wednesday, the Hydralazine was doubled and she states that since Friday, 01/08/16, she has been feeling nauseated, not had an appetite, and just can't eat.  Starting today, pt states that she is freezing.  Pt was asked to check her temperature, it was 98.8.  Pt would like to know could the changing in the medications cause any of these symptoms?   Please advise!

## 2016-01-11 NOTE — Telephone Encounter (Signed)
New message    Pt verbalized that she has been nausea since 12/15

## 2016-01-11 NOTE — Telephone Encounter (Signed)
Called and spoke to patient. She is convinced that nausea is related to clonidine or hydralazine. Explained that doubtful that nausea was related to medications given slow taper of medications to this point, but nausea is a side effect of clonidine taper. Denied fever and muscle aches. She does state she is having some pain in her abdomen which started today (she does not believe this is related to her IBS). Since patient is convinced that medication related will step back up on clonidine dose. Take 1/2 0.3mg  tablet TID for 7 days then call clinic to report on symptoms on 12/26 when office reopens. Will then decrease back to 1/2 tablet BID and continue taper if symptoms improved. Advised that if symptoms worsen will need to be evaluated by PCP or in ER. She states she understands and appreciated help.

## 2016-01-14 ENCOUNTER — Ambulatory Visit: Payer: Medicare Other | Admitting: Physical Therapy

## 2016-01-14 DIAGNOSIS — M79605 Pain in left leg: Secondary | ICD-10-CM

## 2016-01-14 DIAGNOSIS — R262 Difficulty in walking, not elsewhere classified: Secondary | ICD-10-CM

## 2016-01-14 DIAGNOSIS — M545 Low back pain: Secondary | ICD-10-CM | POA: Diagnosis not present

## 2016-01-14 NOTE — Therapy (Signed)
[place], [place], [address] Phone: [phone]   Fax:  [phone]  Physical Therapy Treatment  Patient Details  Name: Christine Santos MRN: [id number] Date of Birth: [birth year] Referring Provider: Forrest Moron, MD  Encounter Date: 01/14/2016      PT End of Session - 01/14/16 1414    Visit Number 7   Number of Visits 11   Date for PT Re-Evaluation 01/29/16   PT Start Time 5462   PT Stop Time 1430   PT Time Calculation (min) 57 min   Activity Tolerance Patient tolerated treatment well   Behavior During Therapy [facility] for tasks assessed/performed      Past Medical History:  Diagnosis Date  . Anemia 08/29/2011  . Anxiety   . Arthritis    "legs" (01/22/2013)  . CAD (coronary artery disease)    s/p CABG in 1999  . Cataract   . Chronic kidney disease   . CLL (chronic lymphoblastic leukemia)   . Diastolic heart failure ([place])   . GERD (gastroesophageal reflux disease)   . H/O hiatal hernia   . Heart murmur   . Hyperlipidemia   . Hypertension   . IDDM (insulin dependent diabetes mellitus) ([place])   . PAD (peripheral artery disease) ([place])   . Paroxysmal atrial fibrillation (HCC)    a. identified on ILR as part of StrokeAF study  . Peripheral vascular disease ([place])   . Stroke (cerebrum) [facility])     Past Surgical History:  Procedure Laterality Date  . ANGIOPLASTY / STENTING FEMORAL Right 09/2010   SFA/notes 11/25/2010 (01/22/2013)  . ANGIOPLASTY / STENTING FEMORAL Left 11/2010   SFA/notes 11/25/2010 (01/22/2013)  . ANGIOPLASTY / STENTING ILIAC     [name] 11/25/2010 (01/22/2013)  . CATARACT EXTRACTION W/ INTRAOCULAR LENS  IMPLANT, BILATERAL Bilateral ?2011  . CHOLECYSTECTOMY  1993  . CORONARY ARTERY BYPASS GRAFT  10/1994   "CABG X3"  . EP IMPLANTABLE DEVICE N/A 09/11/2015   Procedure: Loop Recorder Insertion;  Surgeon: Thompson Grayer, MD;  Location: [facility];  Service: Cardiovascular;   Laterality: N/A;  . HEEL SPUR EXCISION Bilateral 1970's  . LOWER EXTREMITY ANGIOGRAM N/A 01/22/2013   Procedure: LOWER EXTREMITY ANGIOGRAM;  Surgeon: [name], MD;  Location: [facility] CATH LAB;  Service: Cardiovascular;  Laterality: N/A;  . SHOULDER OPEN ROTATOR CUFF REPAIR Bilateral 1990's   "2 times on 1 side; once on the other"  . TRANSLUMINAL ATHERECTOMY FEMORAL ARTERY Right 01/22/2013   & balloon  . TUBAL LIGATION  1980's    There were no vitals filed for this visit.      Subjective Assessment - 01/14/16 1334    Subjective Now able to wash dishes with less pain. Missed last session due to not feeling well with medication change   Currently in Pain? Yes   Pain Score 2   spikes to 4/10   Pain Location Sacrum   Pain Orientation Left   Pain Descriptors / Indicators --  stabbing,  feels like sitting on hot coals.  Stone bruise   Pain Type Acute pain   Aggravating Factors  longer standing   Pain Relieving Factors lidocane cream                         [facility] Adult PT Treatment/Exercise - 01/14/16 0001      Lumbar Exercises: Stretches   Double Knee to Chest Stretch 5 reps   Double Knee to  Chest Stretch Limitations ball,  smaller motions to avoid pain     Knee/Hip Exercises: Stretches   Piriformis Stretch Limitations figure 4 bilat  10 x 2 seconds hold,    Gastroc Stretch 1 rep;30 seconds   Gastroc Stretch Limitations slant board   Soleus Stretch Limitations slant boardm 30 seconds     Knee/Hip Exercises: Aerobic   Stationary Bike 6 minutes L1     Knee/Hip Exercises: Supine   Bridges with Ball Squeeze 10 reps;2 sets   Other Supine Knee/Hip Exercises hooklying ball squeeze 10x5s holds  2 X     Moist Heat Therapy   Number Minutes Moist Heat 15 Minutes   Moist Heat Location Lumbar Spine                  PT Short Term Goals - 01/14/16 1520      PT SHORT TERM GOAL #1   Title Pt will verbalize resolution of sharp pain when standing by  12/22   Time 3   Period Weeks   Status Unable to assess     PT SHORT TERM GOAL #2   Title Pt will verbalize ability to utilize stretches and exercises to decrease pain throughout the day   Time 3   Status Unable to assess           PT Long Term Goals - 12/21/15 1037      PT LONG TERM GOAL #1   Title FOTO to 56% ability to indicate significant functional ability by 01/29/16   Baseline 31% ability at eval   Time 6   Period Weeks   Status New     PT LONG TERM GOAL #2   Title pt will be able to walk through [employer] without limitation by pain to return to PLOF   Baseline  unable at eval   Time 6   Period Weeks   Status New     PT LONG TERM GOAL #3   Title Pt will verbalize max pain <=2/10 during functional daily activities   Baseline up to 7/10 at eval   Time 6   Period Weeks   Status New     PT LONG TERM GOAL #4   Title Pt will verbalize resolution of sensation that she is going to fall due to SIJ pain   Baseline feels like she is going to fall when pain hits upon standing   Time 6   Period Weeks   Status New               Plan - 01/14/16 1415    Clinical Impression Statement Pain improving with ADL's  She tolerated strengthening and stretching exercises,  Mild increase of sharp pain with double knees to chest with feet on ball, pain eased with smaller motions.     PT Next Visit Plan abdominal bracing, SIJ mobs, bike   PT Home Exercise Plan hooklying LE ADD/ABD, LTR, AHSS, prone glute sets 12/4: LTR to left   Consulted and Agree with Plan of Care Patient;Family member/caregiver   Family Member Consulted Husband      Patient will benefit from skilled therapeutic intervention in order to improve the following deficits and impairments:  Difficulty walking, Pain, Increased muscle spasms, Decreased activity tolerance, Improper body mechanics, Postural dysfunction, Decreased strength  Visit Diagnosis: Acute left-sided low back pain, with sciatica presence  unspecified  Difficulty in walking, not elsewhere classified  Pain in left leg     Problem List Patient Active  Problem List   Diagnosis Date Noted  . Paroxysmal atrial fibrillation ([place]) 10/22/2015  . Acute on chronic kidney failure ([place]) 09/10/2015  . Acute lacunar infarction ([place]) 09/03/2015  . Sinusitis, chronic 08/05/2015  . Weight loss, unintentional 07/02/2015  . Diabetes mellitus type 2 with neurological manifestations ([place]) 02/26/2014  . Hyperkalemia 06/21/2013  . Diabetic nephropathy ([place]) 05/23/2013  . Diabetic neuropathy ([place]) 11/30/2012  . Macular degeneration 07/04/2012  . Diabetic retinopathy ([place]) 03/20/2012  . Osteopenia 09/22/2011  . Foot pain 09/21/2011  . Anemia in chronic kidney disease 08/29/2011  . Gout 04/13/2011  . Stress incontinence, female 11/11/2010  . Anxiety 08/18/2010  . Chronic systolic heart failure ([place]) 02/18/2009  . SINUS BRADYCARDIA 10/29/2008  . Peripheral vascular disease ([place]) 06/18/2008  . Chronic kidney disease, stage IV (severe) ([place]) 07/13/2007  . Chronic lymphocytic leukemia ([place]) 10/13/2006  . 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 HYPERCHOLESTEROLEMIA 03/23/2006  . OBESITY, NOS 03/23/2006  . Essential hypertension 03/23/2006  . Coronary atherosclerosis 03/23/2006  . REFLUX ESOPHAGITIS 03/23/2006  . IRRITABLE BOWEL SYNDROME 03/23/2006  . CYSTOCELE/RECTOCELE/PROLAPSE,UNSPEC. 03/23/2006  . Osteoarthritis, multiple sites 03/23/2006    [facility] PTA 01/14/2016, 3:28 PM  [facility] [address] [place], [place], [address] Phone: [phone]   Fax:  [phone]  Name: Christine Santos MRN: [id number] Date of Birth: 15-Sep-1945

## 2016-01-19 ENCOUNTER — Encounter: Payer: Self-pay | Admitting: Physical Therapy

## 2016-01-19 ENCOUNTER — Ambulatory Visit: Payer: Medicare Other | Admitting: Physical Therapy

## 2016-01-19 ENCOUNTER — Telehealth: Payer: Self-pay | Admitting: Pharmacist

## 2016-01-19 DIAGNOSIS — M79605 Pain in left leg: Secondary | ICD-10-CM

## 2016-01-19 DIAGNOSIS — M545 Low back pain: Secondary | ICD-10-CM | POA: Diagnosis not present

## 2016-01-19 DIAGNOSIS — R262 Difficulty in walking, not elsewhere classified: Secondary | ICD-10-CM

## 2016-01-19 NOTE — Therapy (Signed)
[place], [place], [address] Phone: [phone]   Fax:  [phone]  Physical Therapy Treatment  Patient Details  Name: Christine Santos MRN: [id number] Date of Birth: 24-Oct-1945 Referring Provider: Forrest Moron, MD  Encounter Date: 01/19/2016      PT End of Session - 01/19/16 1422    Visit Number 8   Number of Visits 11   Date for PT Re-Evaluation 01/29/16   Authorization Type BCBS MCR- KX at visit 15   PT Start Time 1418   PT Stop Time 1515   PT Time Calculation (min) 57 min   Activity Tolerance Patient tolerated treatment well   Behavior During Therapy [facility] for tasks assessed/performed      Past Medical History:  Diagnosis Date  . Anemia 08/29/2011  . Anxiety   . Arthritis    "legs" (01/22/2013)  . CAD (coronary artery disease)    s/p CABG in 1999  . Cataract   . Chronic kidney disease   . CLL (chronic lymphoblastic leukemia)   . Diastolic heart failure ([place])   . GERD (gastroesophageal reflux disease)   . H/O hiatal hernia   . Heart murmur   . Hyperlipidemia   . Hypertension   . IDDM (insulin dependent diabetes mellitus) ([place])   . PAD (peripheral artery disease) ([place])   . Paroxysmal atrial fibrillation (HCC)    a. identified on ILR as part of StrokeAF study  . Peripheral vascular disease ([place])   . Stroke (cerebrum) [facility])     Past Surgical History:  Procedure Laterality Date  . ANGIOPLASTY / STENTING FEMORAL Right 09/2010   SFA/notes 11/25/2010 (01/22/2013)  . ANGIOPLASTY / STENTING FEMORAL Left 11/2010   SFA/notes 11/25/2010 (01/22/2013)  . ANGIOPLASTY / STENTING ILIAC     [name] 11/25/2010 (01/22/2013)  . CATARACT EXTRACTION W/ INTRAOCULAR LENS  IMPLANT, BILATERAL Bilateral ?2011  . CHOLECYSTECTOMY  1993  . CORONARY ARTERY BYPASS GRAFT  10/1994   "CABG X3"  . EP IMPLANTABLE DEVICE N/A 09/11/2015   Procedure: Loop Recorder Insertion;  Surgeon: Thompson Grayer, MD;  Location: [place] [facility];  Service: Cardiovascular;  Laterality: N/A;  . HEEL SPUR EXCISION Bilateral 1970's  . LOWER EXTREMITY ANGIOGRAM N/A 01/22/2013   Procedure: LOWER EXTREMITY ANGIOGRAM;  Surgeon: [name], MD;  Location: [facility] CATH LAB;  Service: Cardiovascular;  Laterality: N/A;  . SHOULDER OPEN ROTATOR CUFF REPAIR Bilateral 1990's   "2 times on 1 side; once on the other"  . TRANSLUMINAL ATHERECTOMY FEMORAL ARTERY Right 01/22/2013   & balloon  . TUBAL LIGATION  1980's    There were no vitals filed for this visit.      Subjective Assessment - 01/19/16 1422    Subjective Pt reports ischial tuberosity is still tender but she can get around. Arches of her feet are bothering her. Feels LBP and bilat ischial tuberosity pain when bending forward.    How long can you sit comfortably? about 1 hour on something soft.    Patient Stated Goals decrease pain, walk   Currently in Pain? Yes   Pain Score 3    Pain Location Buttocks  ischial tuberosity   Pain Orientation Left   Pain Descriptors / Indicators Sharp;Stabbing   Aggravating Factors  bending forward                         [facility] Adult PT Treatment/Exercise - 01/19/16 0001  Lumbar Exercises: Stretches   Lower Trunk Rotation Limitations bilateral   Prone Mid Back Stretch Limitations seated flexion stretch, slide on table     Knee/Hip Exercises: Aerobic   Nustep 10 min L4, UE & LE     Knee/Hip Exercises: Standing   Functional Squat Limitations 2x10 chair taps red tband at knees   Gait Training arm swing     Knee/Hip Exercises: Seated   Ball Squeeze paried with pelvic tilt     Knee/Hip Exercises: Supine   Other Supine Knee/Hip Exercises 90/90 lift with opp UE resist   Other Supine Knee/Hip Exercises bilat 90/90 LE lift with ball squeeze     Knee/Hip Exercises: Sidelying   Clams x20 each     Moist Heat Therapy   Number Minutes Moist Heat 15 Minutes   Moist Heat Location Lumbar Spine                 PT Education - 01/19/16 1425    Education provided Yes   Education Details exercise form/rationale   Person(s) Educated Patient   Methods Explanation;Demonstration;Tactile cues;Verbal cues   Comprehension Verbalized understanding;Returned demonstration;Verbal cues required;Tactile cues required;Need further instruction          PT Short Term Goals - 01/14/16 1520      PT SHORT TERM GOAL #1   Title Pt will verbalize resolution of sharp pain when standing by 12/22   Time 3   Period Weeks   Status Unable to assess     PT SHORT TERM GOAL #2   Title Pt will verbalize ability to utilize stretches and exercises to decrease pain throughout the day   Time 3   Status Unable to assess           PT Long Term Goals - 12/21/15 1037      PT LONG TERM GOAL #1   Title FOTO to 56% ability to indicate significant functional ability by 01/29/16   Baseline 31% ability at eval   Time 6   Period Weeks   Status New     PT LONG TERM GOAL #2   Title pt will be able to walk through [employer] without limitation by pain to return to PLOF   Baseline  unable at eval   Time 6   Period Weeks   Status New     PT LONG TERM GOAL #3   Title Pt will verbalize max pain <=2/10 during functional daily activities   Baseline up to 7/10 at eval   Time 6   Period Weeks   Status New     PT LONG TERM GOAL #4   Title Pt will verbalize resolution of sensation that she is going to fall due to SIJ pain   Baseline feels like she is going to fall when pain hits upon standing   Time 6   Period Weeks   Status New               Plan - 01/19/16 1500    Clinical Impression Statement Overall decreasing pain with seated and standing in functional daily acitivities. Reports lack of time throughout the day to do exercises. Encouraged pt to perform arm swing for balance and decrease use of bilat QL.    PT Next Visit Plan abdominal bracing, SIJ mobs, bike   PT Home Exercise Plan hooklying  LE ADD/ABD, LTR, AHSS, prone glute sets 12/4: LTR to left   Consulted and Agree with Plan of Care Patient;Family member/caregiver  Family Member Consulted Husband      Patient will benefit from skilled therapeutic intervention in order to improve the following deficits and impairments:     Visit Diagnosis: Acute left-sided low back pain, with sciatica presence unspecified  Difficulty in walking, not elsewhere classified  Pain in left leg     Problem List Patient Active Problem List   Diagnosis Date Noted  . Paroxysmal atrial fibrillation ([place]) 10/22/2015  . Acute on chronic kidney failure ([place]) 09/10/2015  . Acute lacunar infarction ([place]) 09/03/2015  . Sinusitis, chronic 08/05/2015  . Weight loss, unintentional 07/02/2015  . Diabetes mellitus type 2 with neurological manifestations ([place]) 02/26/2014  . Hyperkalemia 06/21/2013  . Diabetic nephropathy ([place]) 05/23/2013  . Diabetic neuropathy ([place]) 11/30/2012  . Macular degeneration 07/04/2012  . Diabetic retinopathy ([place]) 03/20/2012  . Osteopenia 09/22/2011  . Foot pain 09/21/2011  . Anemia in chronic kidney disease 08/29/2011  . Gout 04/13/2011  . Stress incontinence, female 11/11/2010  . Anxiety 08/18/2010  . Chronic systolic heart failure ([place]) 02/18/2009  . SINUS BRADYCARDIA 10/29/2008  . Peripheral vascular disease ([place]) 06/18/2008  . Chronic kidney disease, stage IV (severe) ([place]) 07/13/2007  . Chronic lymphocytic leukemia ([place]) 10/13/2006  . 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 HYPERCHOLESTEROLEMIA 03/23/2006  . OBESITY, NOS 03/23/2006  . Essential hypertension 03/23/2006  . Coronary atherosclerosis 03/23/2006  . REFLUX ESOPHAGITIS 03/23/2006  . IRRITABLE BOWEL SYNDROME 03/23/2006  . CYSTOCELE/RECTOCELE/PROLAPSE,UNSPEC. 03/23/2006  . Osteoarthritis, multiple sites 03/23/2006   Christine Santos PT, DPT 01/19/16 3:04 PM   [place]  [facility] [address] [place], [place], [address] Phone: [phone]   Fax:  [phone]  Name: Christine Santos MRN: [id number] Date of Birth: 1945-10-14

## 2016-01-19 NOTE — Telephone Encounter (Signed)
Spoke to patient and she reports that she is feeling much better than last week. Her pressures have been mostly controlled with none >150/90 and only one >140. She has already taking 2 doses of clonidine today. Will have her start clonidine 1/2 tablet BID tomorrow for 1 week then decreased to clonidine 1/2 tablet once daily until follow up on 02/03/15. If pt tolerating well at that point can d/c and adjust hydralazine if needed for elevated pressures.   Pt states understanding and appreciation for help. She will keep appt on 02/03/15.

## 2016-01-19 NOTE — Telephone Encounter (Signed)
New message      Pt c/o medication issue:  1. Name of Medication: clonidine 0.3mg  and hydralazine 50mg  2. How are you currently taking this medication (dosage and times per day)? Clonidine 1/2 tab tid and hydralazine 1 tab tid 3. Are you having a reaction (difficulty breathing--STAT)? no 4. What is your medication issue? Calling to give update on medication dosage changes.  Since starting new dosage, nausea is "pretty much gone"

## 2016-01-20 DIAGNOSIS — H43813 Vitreous degeneration, bilateral: Secondary | ICD-10-CM | POA: Diagnosis not present

## 2016-01-20 DIAGNOSIS — H33311 Horseshoe tear of retina without detachment, right eye: Secondary | ICD-10-CM | POA: Diagnosis not present

## 2016-01-20 DIAGNOSIS — E113393 Type 2 diabetes mellitus with moderate nonproliferative diabetic retinopathy without macular edema, bilateral: Secondary | ICD-10-CM | POA: Diagnosis not present

## 2016-01-20 LAB — HM DIABETES EYE EXAM

## 2016-01-21 ENCOUNTER — Ambulatory Visit: Payer: Medicare Other | Admitting: Physical Therapy

## 2016-01-21 DIAGNOSIS — M79605 Pain in left leg: Secondary | ICD-10-CM

## 2016-01-21 DIAGNOSIS — R262 Difficulty in walking, not elsewhere classified: Secondary | ICD-10-CM

## 2016-01-21 DIAGNOSIS — M545 Low back pain: Secondary | ICD-10-CM

## 2016-01-21 NOTE — Therapy (Signed)
[place], [place], [address] Phone: [phone]   Fax:  [phone]  Physical Therapy Treatment  Patient Details  Name: Christine Santos MRN: [id number] Date of Birth: February 15, 1945 Referring Provider: Forrest Moron, MD  Encounter Date: 01/21/2016      PT End of Session - 01/21/16 1422    Visit Number 9   Number of Visits 11   Date for PT Re-Evaluation 01/29/16   PT Start Time 1330   PT Stop Time 1417   PT Time Calculation (min) 47 min   Activity Tolerance Patient tolerated treatment well   Behavior During Therapy [facility] for tasks assessed/performed      Past Medical History:  Diagnosis Date  . Anemia 08/29/2011  . Anxiety   . Arthritis    "legs" (01/22/2013)  . CAD (coronary artery disease)    s/p CABG in 1999  . Cataract   . Chronic kidney disease   . CLL (chronic lymphoblastic leukemia)   . Diastolic heart failure ([place])   . GERD (gastroesophageal reflux disease)   . H/O hiatal hernia   . Heart murmur   . Hyperlipidemia   . Hypertension   . IDDM (insulin dependent diabetes mellitus) ([place])   . PAD (peripheral artery disease) ([place])   . Paroxysmal atrial fibrillation (HCC)    a. identified on ILR as part of StrokeAF study  . Peripheral vascular disease ([place])   . Stroke (cerebrum) [facility])     Past Surgical History:  Procedure Laterality Date  . ANGIOPLASTY / STENTING FEMORAL Right 09/2010   SFA/notes 11/25/2010 (01/22/2013)  . ANGIOPLASTY / STENTING FEMORAL Left 11/2010   SFA/notes 11/25/2010 (01/22/2013)  . ANGIOPLASTY / STENTING ILIAC     [name] 11/25/2010 (01/22/2013)  . CATARACT EXTRACTION W/ INTRAOCULAR LENS  IMPLANT, BILATERAL Bilateral ?2011  . CHOLECYSTECTOMY  1993  . CORONARY ARTERY BYPASS GRAFT  10/1994   "CABG X3"  . EP IMPLANTABLE DEVICE N/A 09/11/2015   Procedure: Loop Recorder Insertion;  Surgeon: Thompson Grayer, MD;  Location: [place] CV LAB;  Service: Cardiovascular;   Laterality: N/A;  . HEEL SPUR EXCISION Bilateral 1970's  . LOWER EXTREMITY ANGIOGRAM N/A 01/22/2013   Procedure: LOWER EXTREMITY ANGIOGRAM;  Surgeon: [name], MD;  Location: [facility] CATH LAB;  Service: Cardiovascular;  Laterality: N/A;  . SHOULDER OPEN ROTATOR CUFF REPAIR Bilateral 1990's   "2 times on 1 side; once on the other"  . TRANSLUMINAL ATHERECTOMY FEMORAL ARTERY Right 01/22/2013   & balloon  . TUBAL LIGATION  1980's    There were no vitals filed for this visit.      Subjective Assessment - 01/21/16 1335    Subjective Doing OK.  My bone on my bottom still gets sore when i sit down.  It is a whole lot better.   Pain Score 3    Pain Location Buttocks                         [facility] Adult PT Treatment/Exercise - 01/21/16 0001      Lumbar Exercises: Stretches   Passive Hamstring Stretch Limitations painful Left mild, 2 reps 10 seconds,  right 3 x 30 sessconds   Double Knee to Chest Stretch 5 reps   Double Knee to Chest Stretch Limitations legs on red ball, min assist 10 second holds     Knee/Hip Exercises: Aerobic   Stationary Bike Bike L1 X minutes  2.2 miles.  10.5  minutes     Knee/Hip Exercises: Seated   Hamstring Curl 10 reps;2 sets  Hamstring curls, green bands     Knee/Hip Exercises: Supine   Hip Adduction Isometric Limitations 10 X 2 with ball squeeze   Bridges with Ball Squeeze 10 reps;2 sets   Straight Leg Raises 10 reps;2 sets  left only   Other Supine Knee/Hip Exercises abdominal bracing with march 5 x altermating     Knee/Hip Exercises: Sidelying   Clams 20 X     Knee/Hip Exercises: Prone   Hip Extension Limitations prone gluteal sets 10 X                  PT Short Term Goals - 01/21/16 1424      PT SHORT TERM GOAL #1   Title Pt will verbalize resolution of sharp pain when standing by 12/22   Baseline intermittant   Time 3   Period Weeks   Status On-going     PT SHORT TERM GOAL #2   Title Pt will verbalize  ability to utilize stretches and exercises to decrease pain throughout the day   Baseline thinks exercises have helped,  not consistant   Time 3   Period Weeks   Status On-going           PT Long Term Goals - 12/21/15 1037      PT LONG TERM GOAL #1   Title FOTO to 56% ability to indicate significant functional ability by 01/29/16   Baseline 31% ability at eval   Time 6   Period Weeks   Status New     PT LONG TERM GOAL #2   Title pt will be able to walk through [employer] without limitation by pain to return to PLOF   Baseline  unable at eval   Time 6   Period Weeks   Status New     PT LONG TERM GOAL #3   Title Pt will verbalize max pain <=2/10 during functional daily activities   Baseline up to 7/10 at eval   Time 6   Period Weeks   Status New     PT LONG TERM GOAL #4   Title Pt will verbalize resolution of sensation that she is going to fall due to SIJ pain   Baseline feels like she is going to fall when pain hits upon standing   Time 6   Period Weeks   Status New               Plan - 01/21/16 1422    Clinical Impression Statement Mild pain increase left buttock from hamstring stretch.  mPatient declined the need/ offer of modalities.  Patient able to transistion om mat with less effort.  She decdlined the need to work on gait due to long standing foot pain.   PT Next Visit Plan abdominal bracing, SIJ mobs, bike   PT Home Exercise Plan hooklying LE ADD/ABD, LTR, AHSS, prone glute sets 12/4: LTR to left   Consulted and Agree with Plan of Care Patient;Family member/caregiver   Family Member Consulted Husband      Patient will benefit from skilled therapeutic intervention in order to improve the following deficits and impairments:  Difficulty walking, Pain, Increased muscle spasms, Decreased activity tolerance, Improper body mechanics, Postural dysfunction, Decreased strength  Visit Diagnosis: Acute left-sided low back pain, with sciatica presence  unspecified  Difficulty in walking, not elsewhere classified  Pain in left leg     Problem List  Patient Active Problem List   Diagnosis Date Noted  . Paroxysmal atrial fibrillation ([place]) 10/22/2015  . Acute on chronic kidney failure ([place]) 09/10/2015  . Acute lacunar infarction ([place]) 09/03/2015  . Sinusitis, chronic 08/05/2015  . Weight loss, unintentional 07/02/2015  . Diabetes mellitus type 2 with neurological manifestations ([place]) 02/26/2014  . Hyperkalemia 06/21/2013  . Diabetic nephropathy ([place]) 05/23/2013  . Diabetic neuropathy ([place]) 11/30/2012  . Macular degeneration 07/04/2012  . Diabetic retinopathy ([place]) 03/20/2012  . Osteopenia 09/22/2011  . Foot pain 09/21/2011  . Anemia in chronic kidney disease 08/29/2011  . Gout 04/13/2011  . Stress incontinence, female 11/11/2010  . Anxiety 08/18/2010  . Chronic systolic heart failure ([place]) 02/18/2009  . SINUS BRADYCARDIA 10/29/2008  . Peripheral vascular disease ([place]) 06/18/2008  . Chronic kidney disease, stage IV (severe) ([place]) 07/13/2007  . Chronic lymphocytic leukemia ([place]) 10/13/2006  . 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 HYPERCHOLESTEROLEMIA 03/23/2006  . OBESITY, NOS 03/23/2006  . Essential hypertension 03/23/2006  . Coronary atherosclerosis 03/23/2006  . REFLUX ESOPHAGITIS 03/23/2006  . IRRITABLE BOWEL SYNDROME 03/23/2006  . CYSTOCELE/RECTOCELE/PROLAPSE,UNSPEC. 03/23/2006  . Osteoarthritis, multiple sites 03/23/2006    [facility] PTA 01/21/2016, 2:25 PM  [facility] [address] [place], [place], [address] Phone: [phone]   Fax:  [phone]  Name: Christine Santos MRN: [id number] Date of Birth: 03-25-1945

## 2016-01-26 ENCOUNTER — Ambulatory Visit: Payer: Medicare Other | Attending: Family Medicine | Admitting: Physical Therapy

## 2016-01-26 ENCOUNTER — Encounter: Payer: Self-pay | Admitting: Physical Therapy

## 2016-01-26 DIAGNOSIS — R262 Difficulty in walking, not elsewhere classified: Secondary | ICD-10-CM | POA: Insufficient documentation

## 2016-01-26 DIAGNOSIS — M79605 Pain in left leg: Secondary | ICD-10-CM | POA: Insufficient documentation

## 2016-01-26 DIAGNOSIS — M545 Low back pain: Secondary | ICD-10-CM | POA: Insufficient documentation

## 2016-01-26 NOTE — Therapy (Signed)
[place], [place], [address] Phone: [phone]   Fax:  [phone]  Physical Therapy Treatment  Patient Details  Name: Christine Santos MRN: [id number] Date of Birth: [birth year] Referring Provider: Forrest Moron, MD  Encounter Date: 01/26/2016      PT End of Session - 01/26/16 1332    Visit Number 10   Number of Visits 11   Date for PT Re-Evaluation 01/29/16   Authorization Type BCBS MCR- KX at visit 15   PT Start Time 1332   PT Stop Time 1413   PT Time Calculation (min) 41 min   Activity Tolerance Patient tolerated treatment well   Behavior During Therapy [facility] for tasks assessed/performed      Past Medical History:  Diagnosis Date  . Anemia 08/29/2011  . Anxiety   . Arthritis    "legs" (01/22/2013)  . CAD (coronary artery disease)    s/p CABG in 1999  . Cataract   . Chronic kidney disease   . CLL (chronic lymphoblastic leukemia)   . Diastolic heart failure ([place])   . GERD (gastroesophageal reflux disease)   . H/O hiatal hernia   . Heart murmur   . Hyperlipidemia   . Hypertension   . IDDM (insulin dependent diabetes mellitus) ([place])   . PAD (peripheral artery disease) ([place])   . Paroxysmal atrial fibrillation (HCC)    a. identified on ILR as part of StrokeAF study  . Peripheral vascular disease ([place])   . Stroke (cerebrum) [facility])     Past Surgical History:  Procedure Laterality Date  . ANGIOPLASTY / STENTING FEMORAL Right 09/2010   SFA/notes 11/25/2010 (01/22/2013)  . ANGIOPLASTY / STENTING FEMORAL Left 11/2010   SFA/notes 11/25/2010 (01/22/2013)  . ANGIOPLASTY / STENTING ILIAC     [name] 11/25/2010 (01/22/2013)  . CATARACT EXTRACTION W/ INTRAOCULAR LENS  IMPLANT, BILATERAL Bilateral ?2011  . CHOLECYSTECTOMY  1993  . CORONARY ARTERY BYPASS GRAFT  10/1994   "CABG X3"  . EP IMPLANTABLE DEVICE N/A 09/11/2015   Procedure: Loop Recorder Insertion;  Surgeon: Thompson Grayer, MD;  Location: [place] CV LAB;  Service: Cardiovascular;  Laterality: N/A;  . HEEL SPUR EXCISION Bilateral 1970's  . LOWER EXTREMITY ANGIOGRAM N/A 01/22/2013   Procedure: LOWER EXTREMITY ANGIOGRAM;  Surgeon: [name], MD;  Location: [facility] CATH LAB;  Service: Cardiovascular;  Laterality: N/A;  . SHOULDER OPEN ROTATOR CUFF REPAIR Bilateral 1990's   "2 times on 1 side; once on the other"  . TRANSLUMINAL ATHERECTOMY FEMORAL ARTERY Right 01/22/2013   & balloon  . TUBAL LIGATION  1980's    There were no vitals filed for this visit.      Subjective Assessment - 01/26/16 1332    Subjective pt reports she is doing "alright"   feels a little stiff due to cold weather. only feels pain in her R ischial tuberosity when seated on a hard chair.   Currently in Pain? No/denies                         [facility] Adult PT Treatment/Exercise - 01/26/16 0001      Knee/Hip Exercises: Stretches   Active Hamstring Stretch 5 reps;Both  5s holds   Piriformis Stretch Both;2 reps;30 seconds   Gastroc Stretch 2 reps;30 seconds   Gastroc Stretch Limitations slant board     Knee/Hip Exercises: Aerobic   Nustep 10 min L4     Knee/Hip Exercises: Seated  Sit to Sand 2 sets;5 reps;without UE support  ball bw knees     Knee/Hip Exercises: Supine   Bridges with [employer] 15 reps  in DF   Straight Leg Raises Both;2 sets;10 reps   Other Supine Knee/Hip Exercises hooklying abdominal brace with marching     Knee/Hip Exercises: Sidelying   Clams x20 each                PT Education - 01/26/16 1337    Education provided Yes   Education Details exercise form/rationale, HEP   Person(s) Educated Patient   Methods Explanation;Demonstration;Tactile cues;Verbal cues   Comprehension Verbalized understanding;Returned demonstration;Verbal cues required;Tactile cues required;Need further instruction          PT Short Term Goals - 01/21/16 1424      PT SHORT TERM GOAL #1   Title Pt will  verbalize resolution of sharp pain when standing by 12/22   Baseline intermittant   Time 3   Period Weeks   Status On-going     PT SHORT TERM GOAL #2   Title Pt will verbalize ability to utilize stretches and exercises to decrease pain throughout the day   Baseline thinks exercises have helped,  not consistant   Time 3   Period Weeks   Status On-going           PT Long Term Goals - 12/21/15 1037      PT LONG TERM GOAL #1   Title FOTO to 56% ability to indicate significant functional ability by 01/29/16   Baseline 31% ability at eval   Time 6   Period Weeks   Status New     PT LONG TERM GOAL #2   Title pt will be able to walk through [employer] without limitation by pain to return to PLOF   Baseline  unable at eval   Time 6   Period Weeks   Status New     PT LONG TERM GOAL #3   Title Pt will verbalize max pain <=2/10 during functional daily activities   Baseline up to 7/10 at eval   Time 6   Period Weeks   Status New     PT LONG TERM GOAL #4   Title Pt will verbalize resolution of sensation that she is going to fall due to SIJ pain   Baseline feels like she is going to fall when pain hits upon standing   Time 6   Period Weeks   Status New               Plan - 01/26/16 1414    Clinical Impression Statement pt denied increase in pain with exercises today. educated on HEP and importance of continued exercise. will d/c at next visit.    PT Next Visit Plan d/c   PT Home Exercise Plan hooklying LE ADD/ABD, LTR, AHSS, prone glute sets 12/4: LTR to left   Consulted and Agree with Plan of Care Patient;Family member/caregiver   Family Member Consulted Husband      Patient will benefit from skilled therapeutic intervention in order to improve the following deficits and impairments:     Visit Diagnosis: Acute left-sided low back pain, with sciatica presence unspecified  Difficulty in walking, not elsewhere classified  Pain in left leg     Problem  List Patient Active Problem List   Diagnosis Date Noted  . Paroxysmal atrial fibrillation ([place]) 10/22/2015  . Acute on chronic kidney failure ([place]) 09/10/2015  . Acute lacunar  infarction ([place]) 09/03/2015  . Sinusitis, chronic 08/05/2015  . Weight loss, unintentional 07/02/2015  . Diabetes mellitus type 2 with neurological manifestations ([place]) 02/26/2014  . Hyperkalemia 06/21/2013  . Diabetic nephropathy ([place]) 05/23/2013  . Diabetic neuropathy ([place]) 11/30/2012  . Macular degeneration 07/04/2012  . Diabetic retinopathy ([place]) 03/20/2012  . Osteopenia 09/22/2011  . Foot pain 09/21/2011  . Anemia in chronic kidney disease 08/29/2011  . Gout 04/13/2011  . Stress incontinence, female 11/11/2010  . Anxiety 08/18/2010  . Chronic systolic heart failure ([place]) 02/18/2009  . SINUS BRADYCARDIA 10/29/2008  . Peripheral vascular disease ([place]) 06/18/2008  . Chronic kidney disease, stage IV (severe) ([place]) 07/13/2007  . Chronic lymphocytic leukemia ([place]) 10/13/2006  . 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 HYPERCHOLESTEROLEMIA 03/23/2006  . OBESITY, NOS 03/23/2006  . Essential hypertension 03/23/2006  . Coronary atherosclerosis 03/23/2006  . REFLUX ESOPHAGITIS 03/23/2006  . IRRITABLE BOWEL SYNDROME 03/23/2006  . CYSTOCELE/RECTOCELE/PROLAPSE,UNSPEC. 03/23/2006  . Osteoarthritis, multiple sites 03/23/2006   Michaline Kindig C. Jonaya Freshour PT, DPT 01/26/16 2:16 PM   [facility] [address] [place], [place], [address] Phone: [phone]   Fax:  [phone]  Name: Christine Santos MRN: [id number] Date of Birth: Feb 05, 1945

## 2016-01-27 ENCOUNTER — Encounter (HOSPITAL_COMMUNITY): Payer: Self-pay | Admitting: Nurse Practitioner

## 2016-01-27 ENCOUNTER — Ambulatory Visit (HOSPITAL_COMMUNITY)
Admission: RE | Admit: 2016-01-27 | Discharge: 2016-01-27 | Disposition: A | Discharge status: Home / Self Care | Payer: Medicare Other | Source: Ambulatory Visit | Attending: Nurse Practitioner | Admitting: Nurse Practitioner

## 2016-01-27 VITALS — BP 118/58 | HR 74 | Ht 65.25 in | Wt 181.4 lb

## 2016-01-27 DIAGNOSIS — I2510 Atherosclerotic heart disease of native coronary artery without angina pectoris: Secondary | ICD-10-CM | POA: Diagnosis not present

## 2016-01-27 DIAGNOSIS — E785 Hyperlipidemia, unspecified: Secondary | ICD-10-CM | POA: Insufficient documentation

## 2016-01-27 DIAGNOSIS — Z7901 Long term (current) use of anticoagulants: Secondary | ICD-10-CM | POA: Insufficient documentation

## 2016-01-27 DIAGNOSIS — N189 Chronic kidney disease, unspecified: Secondary | ICD-10-CM | POA: Diagnosis not present

## 2016-01-27 DIAGNOSIS — Z79899 Other long term (current) drug therapy: Secondary | ICD-10-CM | POA: Diagnosis not present

## 2016-01-27 DIAGNOSIS — I480 Paroxysmal atrial fibrillation: Secondary | ICD-10-CM | POA: Diagnosis not present

## 2016-01-27 DIAGNOSIS — M1990 Unspecified osteoarthritis, unspecified site: Secondary | ICD-10-CM | POA: Insufficient documentation

## 2016-01-27 DIAGNOSIS — E1122 Type 2 diabetes mellitus with diabetic chronic kidney disease: Secondary | ICD-10-CM | POA: Diagnosis not present

## 2016-01-27 DIAGNOSIS — Z803 Family history of malignant neoplasm of breast: Secondary | ICD-10-CM | POA: Diagnosis not present

## 2016-01-27 DIAGNOSIS — R262 Difficulty in walking, not elsewhere classified: Secondary | ICD-10-CM | POA: Diagnosis not present

## 2016-01-27 DIAGNOSIS — Z885 Allergy status to narcotic agent status: Secondary | ICD-10-CM | POA: Diagnosis not present

## 2016-01-27 DIAGNOSIS — Z841 Family history of disorders of kidney and ureter: Secondary | ICD-10-CM | POA: Diagnosis not present

## 2016-01-27 DIAGNOSIS — Z7951 Long term (current) use of inhaled steroids: Secondary | ICD-10-CM | POA: Insufficient documentation

## 2016-01-27 DIAGNOSIS — I5030 Unspecified diastolic (congestive) heart failure: Secondary | ICD-10-CM | POA: Insufficient documentation

## 2016-01-27 DIAGNOSIS — M545 Low back pain: Secondary | ICD-10-CM | POA: Diagnosis not present

## 2016-01-27 DIAGNOSIS — I4891 Unspecified atrial fibrillation: Secondary | ICD-10-CM | POA: Insufficient documentation

## 2016-01-27 DIAGNOSIS — I130 Hypertensive heart and chronic kidney disease with heart failure and stage 1 through stage 4 chronic kidney disease, or unspecified chronic kidney disease: Secondary | ICD-10-CM | POA: Insufficient documentation

## 2016-01-27 DIAGNOSIS — I10 Essential (primary) hypertension: Secondary | ICD-10-CM | POA: Diagnosis not present

## 2016-01-27 DIAGNOSIS — E1151 Type 2 diabetes mellitus with diabetic peripheral angiopathy without gangrene: Secondary | ICD-10-CM | POA: Diagnosis not present

## 2016-01-27 DIAGNOSIS — M79605 Pain in left leg: Secondary | ICD-10-CM | POA: Diagnosis not present

## 2016-01-27 DIAGNOSIS — K219 Gastro-esophageal reflux disease without esophagitis: Secondary | ICD-10-CM | POA: Insufficient documentation

## 2016-01-27 DIAGNOSIS — Z833 Family history of diabetes mellitus: Secondary | ICD-10-CM | POA: Diagnosis not present

## 2016-01-27 DIAGNOSIS — Z881 Allergy status to other antibiotic agents status: Secondary | ICD-10-CM | POA: Diagnosis not present

## 2016-01-27 DIAGNOSIS — Z8249 Family history of ischemic heart disease and other diseases of the circulatory system: Secondary | ICD-10-CM | POA: Diagnosis not present

## 2016-01-27 DIAGNOSIS — Z794 Long term (current) use of insulin: Secondary | ICD-10-CM | POA: Insufficient documentation

## 2016-01-27 DIAGNOSIS — Z8673 Personal history of transient ischemic attack (TIA), and cerebral infarction without residual deficits: Secondary | ICD-10-CM | POA: Diagnosis not present

## 2016-01-27 NOTE — Progress Notes (Signed)
Primary Care Physician: Zigmund Gottron, MD Referring Physician:[facility] f/u   Christine Santos is a 71 y.o. female with a h/o cryptogenic stroke, CKD, DM, HTN,CAD, that is in the afib clinic for f/u recent hospitalization per schedule" f/u ablation" as scheduled by someone on discharge from her recent stroke. However, she did not have an ablation but had a  linq implanted. She also was just seen in the research department this am as well for cyrtogenic stroke protocol. Her device was interrogated and there was no evidence of atrial arrhythmias. Site has healed. Several of her meds were stopped in the hospital for acute on chronic renal failure and BP was elevated onp presentation by rechecked by me after several minutes of resting was acceptable at 148/68.Her pcp is monitoring her Creatinine and BP and  Has f/u with him again in a couple of weeks. On asa and plavix.   F/u 01/26/16. Since I saw her she did have afib to be recorded on LinQ and is now on eliquis and off asa and plavix. She feels well.Does not notice any irregular heart beat.  Today, she denies symptoms of palpitations, chest pain, shortness of breath, orthopnea, PND, lower extremity edema, dizziness, presyncope, syncope, or neurologic sequela. The patient is tolerating medications without difficulties and is otherwise without complaint today.   Past Medical History:  Diagnosis Date  . Anemia 08/29/2011  . Anxiety   . Arthritis    "legs" (01/22/2013)  . CAD (coronary artery disease)    s/p CABG in 1999  . Cataract   . Chronic kidney disease   . CLL (chronic lymphoblastic leukemia)   . Diastolic heart failure ([place])   . GERD (gastroesophageal reflux disease)   . H/O hiatal hernia   . Heart murmur   . Hyperlipidemia   . Hypertension   . IDDM (insulin dependent diabetes mellitus) ([place])   . PAD (peripheral artery disease) ([place])   . Paroxysmal atrial fibrillation (HCC)    a. identified on ILR as part of StrokeAF study  .  Peripheral vascular disease ([place])   . Stroke (cerebrum) [facility])    Past Surgical History:  Procedure Laterality Date  . ANGIOPLASTY / STENTING FEMORAL Right 09/2010   SFA/notes 11/25/2010 (01/22/2013)  . ANGIOPLASTY / STENTING FEMORAL Left 11/2010   SFA/notes 11/25/2010 (01/22/2013)  . ANGIOPLASTY / STENTING ILIAC     [name] 11/25/2010 (01/22/2013)  . CATARACT EXTRACTION W/ INTRAOCULAR LENS  IMPLANT, BILATERAL Bilateral ?2011  . CHOLECYSTECTOMY  1993  . CORONARY ARTERY BYPASS GRAFT  10/1994   "CABG X3"  . EP IMPLANTABLE DEVICE N/A 09/11/2015   Procedure: Loop Recorder Insertion;  Surgeon: Thompson Grayer, MD;  Location: [place] CV LAB;  Service: Cardiovascular;  Laterality: N/A;  . HEEL SPUR EXCISION Bilateral 1970's  . LOWER EXTREMITY ANGIOGRAM N/A 01/22/2013   Procedure: LOWER EXTREMITY ANGIOGRAM;  Surgeon: [name], MD;  Location: [facility] CATH LAB;  Service: Cardiovascular;  Laterality: N/A;  . SHOULDER OPEN ROTATOR CUFF REPAIR Bilateral 1990's   "2 times on 1 side; once on the other"  . TRANSLUMINAL ATHERECTOMY FEMORAL ARTERY Right 01/22/2013   & balloon  . TUBAL LIGATION  1980's    Current Outpatient Prescriptions  Medication Sig Dispense Refill  . allopurinol (ZYLOPRIM) 100 MG tablet Take 1 tablet (100 mg total) by mouth daily. 90 tablet 3  . amLODipine (NORVASC) 10 MG tablet TAKE 1 TABLET(10 MG) BY MOUTH DAILY 90 tablet 3  . apixaban (ELIQUIS) 5 MG TABS  tablet Take 1 tablet (5 mg total) by mouth 2 (two) times daily. 60 tablet 11  . Ascorbic Acid (VITAMIN C) 100 MG tablet Take 100 mg by mouth daily.    Marland Kitchen atorvastatin (LIPITOR) 80 MG tablet TAKE 1 TABLET BY MOUTH DAILY 90 tablet 2  . carboxymethylcellulose (REFRESH PLUS) 0.5 % SOLN 1 drop 3 (three) times daily as needed.    . cloNIDine (CATAPRES) 0.3 MG tablet Take 0.15 mg by mouth daily.     . fenofibrate (TRICOR) 48 MG tablet Take 1 tablet (48 mg total) by mouth daily. 90 tablet 3  . ferrous sulfate 325 (65 FE) MG tablet  Take 1 tablet (325 mg total) by mouth daily with breakfast.  3  . fluticasone (FLONASE) 50 MCG/ACT nasal spray Place 2 sprays into both nostrils daily. 16 g 6  . GNP GARLIC EXTRACT PO Take 1 tablet by mouth daily.     Marland Kitchen HUMALOG KWIKPEN 100 UNIT/ML KiwkPen ADMINISTER 5 UNITS UNDER THE SKIN THREE TIMES DAILY BEFORE MEALS 15 mL 12  . hydrALAZINE (APRESOLINE) 50 MG tablet Take 1 tablet (50 mg total) by mouth 3 (three) times daily. 90 tablet 11  . HYDROcodone-acetaminophen (NORCO) 5-325 MG tablet Take 1 tablet by mouth every 6 (six) hours as needed for moderate pain. 10 tablet 0  . insulin lispro (HUMALOG KWIKPEN) 100 UNIT/ML KiwkPen ADM 5 UNITS [place] TID B MEALS    . LANTUS SOLOSTAR 100 UNIT/ML Solostar Pen ADMINISTER 35 UNITS UNDER THE SKIN DAILY 15 mL 12  . lidocaine (ASPERCREME W/LIDOCAINE) 4 % cream Apply 1 application topically 4 (four) times daily as needed. 30 g 0  . Melatonin 10 MG CAPS Take 10 mg by mouth at bedtime as needed (sleep).     . metoprolol tartrate (LOPRESSOR) 25 MG tablet TAKE 1/2 TABLET BY MOUTH TWICE DAILY 90 tablet 3  . Multiple Vitamins-Minerals (MULTIVITAMINS THER. W/MINERALS) TABS Take 1 tablet by mouth daily.      Marland Kitchen omega-3 acid ethyl esters (LOVAZA) 1 g capsule Take 2 g by mouth 2 (two) times daily.     Marland Kitchen OVER THE COUNTER MEDICATION Take 1 tablet by mouth 2 (two) times daily. Macular Degenerative Eye Supplement    . pantoprazole (PROTONIX) 40 MG tablet TAKE 1 TABLET BY MOUTH DAILY 90 tablet 3  . PARoxetine (PAXIL) 20 MG tablet TAKE 1 TABLET BY MOUTH EVERY MORNING 90 tablet 3  . torsemide (DEMADEX) 100 MG tablet Take 100 mg by mouth daily.    . traMADol (ULTRAM) 50 MG tablet Take 50 mg by mouth every 6 (six) hours as needed.     No current facility-administered medications for this encounter.     Allergies  Allergen Reactions  . Amoxicillin Rash    Has patient had a PCN reaction causing immediate rash, facial/tongue/throat swelling, SOB or lightheadedness with  hypotension: No Has patient had a PCN reaction causing severe rash involving mucus membranes or skin necrosis: No Has patient had a PCN reaction that required hospitalization: No Has patient had a PCN reaction occurring within the last 10 years: No If all of the above answers are "NO", then may proceed with Cephalosporin use.   . Cephalexin Rash  . Codeine Phosphate Rash    Social History   Social History  . Marital status: Divorced    Spouse name: N/A  . Number of children: 2  . Years of education: 12   Occupational History  . DISABILITY Other    Worked at [employer]  Social History Main Topics  . Smoking status: Never Smoker  . Smokeless tobacco: Never Used  . Alcohol use No  . Drug use: No  . Sexual activity: Not Currently   Other Topics Concern  . Not on file   Social History Narrative   Health Care POA:    Emergency Contact: sister, [name] (h) [phone]   End of Life Plan:    Who lives with you: female friend   Any pets: none   Diet: Pt has a varied diet of protein, starch, vegetables   Exercise: Pt does not have any regular exercise routine.   Seatbelts: Pt reports wearing seatbelt when in vehicles.    Sun Exposure/Protection: Pt reports using sun screen.   Hobbies: going to lake, biking, blue grass music          Family History  Problem Relation Age of Onset  . Heart disease Mother   . Heart attack Mother   . Hypertension Mother   . Kidney disease Mother   . Pneumonia Father   . Heart attack Father   . Hyperlipidemia Father   . Diabetes Sister   . Arthritis Brother   . Diabetes Brother   . Heart attack Sister   . Obesity Sister   . Hypertension Sister   . Heart attack Sister   . Cancer Sister     breast  . Hyperlipidemia Sister   . Hypertension Sister   . Hyperlipidemia Sister     ROS- All systems are reviewed and negative except as per the HPI above  Physical Exam: Vitals:   01/27/16 1055  BP: (!) 118/58  Pulse: 74  Weight:  181 lb 6.4 oz (82.3 kg)  Height: 5' 5.25" (1.657 m)    GEN- The patient is well appearing, alert and oriented x 3 today.   Head- normocephalic, atraumatic Eyes-  Sclera clear, conjunctiva pink Ears- hearing intact Oropharynx- clear Neck- supple, no JVP Lymph- no cervical lymphadenopathy Lungs- Clear to ausculation bilaterally, normal work of breathing Heart- Regular rate and rhythm, no murmurs, rubs or gallops, PMI not laterally displaced, linq site healed GI- soft, NT, ND, + BS Extremities- no clubbing, cyanosis, or edema MS- no significant deformity or atrophy Skin- no rash or lesion Psych- euthymic mood, full affect Neuro- strength and sensation are intact  EKG- NSR at 74 bpm, Pr int 142 ms, qrs int 84 ms, qtc 461 ms  Epic records reviewed   Assessment and Plan: 1. Cryptogenic stroke In SR Linq implanted and has shown afib Pt unaware of any afib On eliquis and tolerating afib Off asa and plavix  2. HTN Stable    afib clinic as needed  Geroge Baseman. [place], [facility] [address] [place], [place] [address] [phone]

## 2016-01-28 ENCOUNTER — Encounter: Payer: Self-pay | Admitting: Physical Therapy

## 2016-01-28 ENCOUNTER — Ambulatory Visit: Payer: Medicare Other | Admitting: Physical Therapy

## 2016-01-28 DIAGNOSIS — M545 Low back pain: Secondary | ICD-10-CM | POA: Diagnosis not present

## 2016-01-28 DIAGNOSIS — M79605 Pain in left leg: Secondary | ICD-10-CM

## 2016-01-28 DIAGNOSIS — R262 Difficulty in walking, not elsewhere classified: Secondary | ICD-10-CM

## 2016-01-28 NOTE — Therapy (Addendum)
[place], [place], [address] Phone: [phone]   Fax:  [phone]  Physical Therapy Treatment/discharge Summary  Patient Details  Name: Christine Santos MRN: [id number] Date of Birth: Feb 16, 1945 Referring Provider: Forrest Moron, MD  Encounter Date: 01/28/2016      PT End of Session - 01/28/16 1331    Visit Number 1   Number of Visits 11   Date for PT Re-Evaluation 01/29/16   Authorization Type BCBS MCR- KX at visit 15   PT Start Time 1331   PT Stop Time 1402   PT Time Calculation (min) 31 min   Activity Tolerance Patient tolerated treatment well   Behavior During Therapy [facility] for tasks assessed/performed      Past Medical History:  Diagnosis Date  . Anemia 08/29/2011  . Anxiety   . Arthritis    "legs" (01/22/2013)  . CAD (coronary artery disease)    s/p CABG in 1999  . Cataract   . Chronic kidney disease   . CLL (chronic lymphoblastic leukemia)   . Diastolic heart failure ([place])   . GERD (gastroesophageal reflux disease)   . H/O hiatal hernia   . Heart murmur   . Hyperlipidemia   . Hypertension   . IDDM (insulin dependent diabetes mellitus) ([place])   . PAD (peripheral artery disease) ([place])   . Paroxysmal atrial fibrillation (HCC)    a. identified on ILR as part of StrokeAF study  . Peripheral vascular disease ([place])   . Stroke (cerebrum) [facility])     Past Surgical History:  Procedure Laterality Date  . ANGIOPLASTY / STENTING FEMORAL Right 09/2010   SFA/notes 11/25/2010 (01/22/2013)  . ANGIOPLASTY / STENTING FEMORAL Left 11/2010   SFA/notes 11/25/2010 (01/22/2013)  . ANGIOPLASTY / STENTING ILIAC     [name] 11/25/2010 (01/22/2013)  . CATARACT EXTRACTION W/ INTRAOCULAR LENS  IMPLANT, BILATERAL Bilateral ?2011  . CHOLECYSTECTOMY  1993  . CORONARY ARTERY BYPASS GRAFT  10/1994   "CABG X3"  . EP IMPLANTABLE DEVICE N/A 09/11/2015   Procedure: Loop Recorder Insertion;  Surgeon: Thompson Grayer, MD;   Location: [place] [facility];  Service: Cardiovascular;  Laterality: N/A;  . HEEL SPUR EXCISION Bilateral 1970's  . LOWER EXTREMITY ANGIOGRAM N/A 01/22/2013   Procedure: LOWER EXTREMITY ANGIOGRAM;  Surgeon: [name], MD;  Location: [facility] CATH LAB;  Service: Cardiovascular;  Laterality: N/A;  . SHOULDER OPEN ROTATOR CUFF REPAIR Bilateral 1990's   "2 times on 1 side; once on the other"  . TRANSLUMINAL ATHERECTOMY FEMORAL ARTERY Right 01/22/2013   & balloon  . TUBAL LIGATION  1980's    There were no vitals filed for this visit.      Subjective Assessment - 01/28/16 1331    Subjective Went to cardiologist yesterday and reports "everything is good"   Bilateral quad soreness today following exercises.    Patient Stated Goals decrease pain, walk   Currently in Pain? No/denies            [facility] PT Assessment - 01/28/16 0001      Observation/Other Assessments   Focus on Therapeutic Outcomes (FOTO)  83% ability     Strength   Right Hip Flexion 4+/5   Right Hip Extension 3+/5   Left Hip Flexion 4+/5   Left Hip Extension 3+/5     Palpation   Palpation comment denies TTP  [place] [facility] PT Treatment/Exercise - 02/09/16 0001      Knee/Hip Exercises: Aerobic   Nustep 8 min L5                PT Education - 02/09/16 1355    Education provided Yes   Education Details cardio workouts, exercise form/rationale & progress to goals, HEP, importance of continued stretching and exercise, management of ischial tuberosity pain and hamstring insertion point   Person(s) Educated Spouse;Patient   Methods Explanation   Comprehension Verbalized understanding          PT Short Term Goals - 02-09-2016 1341      PT SHORT TERM GOAL #1   Title Pt will verbalize resolution of sharp pain when standing by 12/22   Status Achieved     PT SHORT TERM GOAL #2   Title Pt will verbalize ability to utilize stretches and exercises to decrease pain throughout  the day   Status Achieved           PT Long Term Goals - Feb 09, 2016 1338      PT LONG TERM GOAL #1   Title FOTO to 56% ability to indicate significant functional ability by 01/29/16   Baseline 83% ability   Status Achieved     PT LONG TERM GOAL #2   Title pt will be able to walk through [employer] without limitation by pain to return to PLOF   Baseline "I do a whole lot better"   Status Achieved     PT LONG TERM GOAL #3   Title Pt will verbalize max pain <=2/10 during functional daily activities   Baseline no pain really in SIJ/low back/hip, pain in bilateral feet   Status Achieved     PT LONG TERM GOAL #4   Title Pt will verbalize resolution of sensation that she is going to fall due to SIJ pain   Status Achieved               Plan - February 09, 2016 1403    Clinical Impression Statement Pt has been d/c to independent program at this time. Verbalized comfort and understanding of HEP and reports feeling "much bettter"  Instructed to contact us with any further questions or concerns.    Consulted and Agree with Plan of Care Patient;Family member/caregiver   Family Member Consulted Husband      Patient will benefit from skilled therapeutic intervention in order to improve the following deficits and impairments:     Visit Diagnosis: Acute left-sided low back pain, with sciatica presence unspecified  Difficulty in walking, not elsewhere classified  Pain in left leg       G-Codes - 2016-02-09 1356    Functional Assessment Tool Used FOTO 83% ability (goal 56% ability), clinical judgement   Functional Limitation Mobility: Walking and moving around   Mobility: Walking and Moving Around Goal Status [phone]) At least 40 percent but less than 60 percent impaired, limited or restricted   Mobility: Walking and Moving Around Discharge Status [phone]) At least 1 percent but less than 20 percent impaired, limited or restricted      Problem List Patient Active Problem List   Diagnosis  Date Noted  . Paroxysmal atrial fibrillation ([place]) 10/22/2015  . Acute on chronic kidney failure ([place]) 09/10/2015  . Acute lacunar infarction ([place]) 09/03/2015  . Sinusitis, chronic 08/05/2015  . Weight loss, unintentional 07/02/2015  . Diabetes mellitus type 2 with neurological manifestations ([place]) 02/26/2014  . Hyperkalemia 06/21/2013  . Diabetic nephropathy (  [place]) 05/23/2013  . Diabetic neuropathy ([place]) 11/30/2012  . Macular degeneration 07/04/2012  . Diabetic retinopathy ([place]) 03/20/2012  . Osteopenia 09/22/2011  . Foot pain 09/21/2011  . Anemia in chronic kidney disease 08/29/2011  . Gout 04/13/2011  . Stress incontinence, female 11/11/2010  . Anxiety 08/18/2010  . Chronic systolic heart failure ([place]) 02/18/2009  . SINUS BRADYCARDIA 10/29/2008  . Peripheral vascular disease ([place]) 06/18/2008  . Chronic kidney disease, stage IV (severe) ([place]) 07/13/2007  . Chronic lymphocytic leukemia ([place]) 10/13/2006  . 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 HYPERCHOLESTEROLEMIA 03/23/2006  . OBESITY, NOS 03/23/2006  . Essential hypertension 03/23/2006  . Coronary atherosclerosis 03/23/2006  . REFLUX ESOPHAGITIS 03/23/2006  . IRRITABLE BOWEL SYNDROME 03/23/2006  . CYSTOCELE/RECTOCELE/PROLAPSE,UNSPEC. 03/23/2006  . Osteoarthritis, multiple sites 03/23/2006   PHYSICAL THERAPY DISCHARGE SUMMARY  Visits from Start of Care: 11  Current functional level related to goals / functional outcomes: See above   Remaining deficits: See above   Education / Equipment: Anatomy of condition, POC, HEP, exercise form/rationale  Plan: Patient agrees to discharge.  Patient goals were met. Patient is being discharged due to meeting the stated rehab goals.  ?????     Elani Delph C. Kayleen Alig PT, DPT 01/28/16 2:08 PM   [facility] [address] [place], [place], [address] Phone: [phone]   Fax:  [phone]  Name:  Christine Santos MRN: [id number] Date of Birth: 04-07-1945

## 2016-02-03 ENCOUNTER — Encounter: Payer: Self-pay | Admitting: Pharmacist

## 2016-02-03 ENCOUNTER — Ambulatory Visit (INDEPENDENT_AMBULATORY_CARE_PROVIDER_SITE_OTHER): Payer: Medicare Other | Admitting: Pharmacist

## 2016-02-03 VITALS — BP 128/72 | HR 70 | Wt 182.5 lb

## 2016-02-03 DIAGNOSIS — I10 Essential (primary) hypertension: Secondary | ICD-10-CM

## 2016-02-03 NOTE — Progress Notes (Signed)
Patient ID: Christine Santos                 DOB: Dec 16, 1945                      MRN: [id number]     HPI: Christine Santos is a 71 y.o. female patient of Dr. Irish Lack referred by Lyda Jester, PAto HTN clinic.PMH of CAD (s/p CABG 1996), HTN, DM, CKD IV, chronic diastolic dysfunction (EF 39-76%), Afib and recent stroke (3 months ago). She was seen in hypertension in November and at that time hydralazine 12.5mg  TID was started. She had previously been on lisinopril, but this was discontinued due to rise in Scr. At her most recent OV with hypertension clinic her clonidine was tapered again due to dry mouth and BP spikes secondary to missed doses. She called in several times since this visit as she has been experiencing nausea - which can be associated with clonidine withdrawal and thus her taper was slowed.   She presents today and states that she took her last dose of clonidine on Saturday. She reports that her dry mouth has improved and she no longer constantly needs hard candies to keep mouth moist.   She states her blood sugars have been rather erratic recently and wonders if this is the medication changes.  She also reports her appetite has been erratic as well due to the nausea. She states the nausea is improving and seems to subside if she drinks a small amount of Gatorade prior to getting out of bed.   She also asks about weights (per her log her weight fluctuates between 177 lbs and 179 lbs on her scale.) She denies symptoms of fluid retention and she does not appear to have LLE on exam.   Current HTN meds:  torsemide 100mg  daily lopressor 12.5 mg BID hydralazine 50 mg TID amlodipine 10 mg daily  Previously tried:lisinopril 40 mg daily BP goal: <130/80 mmHg (new HTN guidelines)  Social History: Never smoker, denies alcohol and illicit drug use.   Diet:endorses dietary indiscretions. Does not use salt at home and stays from packaged foods that have salt in them. Limits caffeinated  coffee to one cup.   Exercise:Hasn't exercised lately due to pain in lower back. Wants to start working out at [employer].  Home BP readings: Since stopping clonidine - 144/67, 144/71, 143/65 Her pressure in Afib clinic at goal at 118/58 last week  Wt Readings from Last 3 Encounters:  02/03/16 182 lb 8 oz (82.8 kg)  01/27/16 181 lb 6.4 oz (82.3 kg)  12/12/15 185 lb 6.4 oz (84.1 kg)   BP Readings from Last 3 Encounters:  02/03/16 128/72  01/27/16 (!) 118/58  01/06/16 138/68   Pulse Readings from Last 3 Encounters:  02/03/16 70  01/27/16 74  01/06/16 61    Renal function: CrCl cannot be calculated (Patient's most recent lab result is older than the maximum 21 days allowed.).  Past Medical History:  Diagnosis Date  . Anemia 08/29/2011  . Anxiety   . Arthritis    "legs" (01/22/2013)  . CAD (coronary artery disease)    s/p CABG in 1999  . Cataract   . Chronic kidney disease   . CLL (chronic lymphoblastic leukemia)   . Diastolic heart failure ([place])   . GERD (gastroesophageal reflux disease)   . H/O hiatal hernia   . Heart murmur   . Hyperlipidemia   . Hypertension   . IDDM (insulin  dependent diabetes mellitus) ([place])   . PAD (peripheral artery disease) ([place])   . Paroxysmal atrial fibrillation (HCC)    a. identified on ILR as part of StrokeAF study  . Peripheral vascular disease ([place])   . Stroke (cerebrum) [facility])     Current Outpatient Prescriptions on File Prior to Visit  Medication Sig Dispense Refill  . allopurinol (ZYLOPRIM) 100 MG tablet Take 1 tablet (100 mg total) by mouth daily. 90 tablet 3  . amLODipine (NORVASC) 10 MG tablet TAKE 1 TABLET(10 MG) BY MOUTH DAILY 90 tablet 3  . apixaban (ELIQUIS) 5 MG TABS tablet Take 1 tablet (5 mg total) by mouth 2 (two) times daily. 60 tablet 11  . Ascorbic Acid (VITAMIN C) 100 MG tablet Take 100 mg by mouth daily.    Marland Kitchen atorvastatin (LIPITOR) 80 MG tablet TAKE 1 TABLET BY MOUTH DAILY 90 tablet 2  .  carboxymethylcellulose (REFRESH PLUS) 0.5 % SOLN 1 drop 3 (three) times daily as needed.    . fenofibrate (TRICOR) 48 MG tablet Take 1 tablet (48 mg total) by mouth daily. 90 tablet 3  . ferrous sulfate 325 (65 FE) MG tablet Take 1 tablet (325 mg total) by mouth daily with breakfast.  3  . fluticasone (FLONASE) 50 MCG/ACT nasal spray Place 2 sprays into both nostrils daily. 16 g 6  . GNP GARLIC EXTRACT PO Take 1 tablet by mouth daily.     Marland Kitchen HUMALOG KWIKPEN 100 UNIT/ML KiwkPen ADMINISTER 5 UNITS UNDER THE SKIN THREE TIMES DAILY BEFORE MEALS 15 mL 12  . hydrALAZINE (APRESOLINE) 50 MG tablet Take 1 tablet (50 mg total) by mouth 3 (three) times daily. 90 tablet 11  . LANTUS SOLOSTAR 100 UNIT/ML Solostar Pen ADMINISTER 35 UNITS UNDER THE SKIN DAILY (Patient taking differently: ADMINISTER 37 UNITS UNDER THE SKIN DAILY) 15 mL 12  . Melatonin 10 MG CAPS Take 10 mg by mouth at bedtime as needed (sleep).     . metoprolol tartrate (LOPRESSOR) 25 MG tablet TAKE 1/2 TABLET BY MOUTH TWICE DAILY 90 tablet 3  . Multiple Vitamins-Minerals (MULTIVITAMINS THER. W/MINERALS) TABS Take 1 tablet by mouth daily.      Marland Kitchen omega-3 acid ethyl esters (LOVAZA) 1 g capsule Take 2 g by mouth 2 (two) times daily.     . pantoprazole (PROTONIX) 40 MG tablet TAKE 1 TABLET BY MOUTH DAILY 90 tablet 3  . PARoxetine (PAXIL) 20 MG tablet TAKE 1 TABLET BY MOUTH EVERY MORNING 90 tablet 3  . torsemide (DEMADEX) 100 MG tablet Take 100 mg by mouth daily.    . traMADol (ULTRAM) 50 MG tablet Take 50 mg by mouth every 6 (six) hours as needed.     No current facility-administered medications on file prior to visit.     Allergies  Allergen Reactions  . Amoxicillin Rash    Has patient had a PCN reaction causing immediate rash, facial/tongue/throat swelling, SOB or lightheadedness with hypotension: No Has patient had a PCN reaction causing severe rash involving mucus membranes or skin necrosis: No Has patient had a PCN reaction that required  hospitalization: No Has patient had a PCN reaction occurring within the last 10 years: No If all of the above answers are "NO", then may proceed with Cephalosporin use.   . Cephalexin Rash  . Codeine Phosphate Rash    Blood pressure 128/72, pulse 70, weight 182 lb 8 oz (82.8 kg).   Assessment/Plan: Hypertension: BP at goal in office today. No medication changes today. Will see her  back in 3-4 weeks to assess after several weeks off clonidine. If pressures are elevated could consider titrating hydralazine to 75mg  TID. Also advised to continue to monitor weight though unlikely that will have issues, she was unsure parameters for when to be concerned. Also advised unlikely that blood sugars are related to medication changes and more likely related to appetite.    Thank you, Lelan Pons. Patterson Hammersmith, [facility]  02/03/2016 12:33 PM

## 2016-02-03 NOTE — Patient Instructions (Signed)
Return for a a follow up appointment in 3-4 weeks  If your weight increases 3 lbs in a day or 5 lbs in 1 week call the office  Check your blood pressure at home daily (if able) and keep record of the readings.  Take your BP meds as follows: Continue all medications as prescribed   Bring all of your meds, your BP cuff and your record of home blood pressures to your next appointment.  Exercise as you're able, try to walk approximately 30 minutes per day.  Keep salt intake to a minimum, especially watch canned and prepared boxed foods.  Eat more fresh fruits and vegetables and fewer canned items.  Avoid eating in fast food restaurants.    HOW TO TAKE YOUR BLOOD PRESSURE: . Rest 5 minutes before taking your blood pressure. .  Don't smoke or drink caffeinated beverages for at least 30 minutes before. . Take your blood pressure before (not after) you eat. . Sit comfortably with your back supported and both feet on the floor (don't cross your legs). . Elevate your arm to heart level on a table or a desk. . Use the proper sized cuff. It should fit smoothly and snugly around your bare upper arm. There should be enough room to slip a fingertip under the cuff. The bottom edge of the cuff should be 1 inch above the crease of the elbow. . Ideally, take 3 measurements at one sitting and record the average.

## 2016-02-16 ENCOUNTER — Other Ambulatory Visit: Payer: Self-pay | Admitting: Family Medicine

## 2016-02-22 DIAGNOSIS — E1149 Type 2 diabetes mellitus with other diabetic neurological complication: Secondary | ICD-10-CM | POA: Diagnosis not present

## 2016-02-22 NOTE — Telephone Encounter (Signed)
What medication needs a prior authorization?

## 2016-02-22 NOTE — Telephone Encounter (Signed)
Pt states the first time she called the pharmacy, they said they didn't have it. The next time she was told pt needs a prior authorization from insurance company. ep

## 2016-03-01 NOTE — Progress Notes (Signed)
Patient ID: Christine Santos                 DOB: Dec 29, 1945                      MRN: [id number]     HPI: Christine Santos is a 71 y.o. female patient of Dr. Irish Lack referred by Lyda Jester, PAto HTN clinic.PMH of CAD (s/p CABG 1996), HTN, DM, CKD IV, chronic diastolic dysfunction (EF 13-08%), Afib and recent stroke (3 months ago). She was seen in hypertension in November and at that time hydralazine 12.5mg  TID was started. She had previously been on lisinopril, but this was discontinued due to rise in Scr. At her most recent OV no medication changes were made. The Patient had completed her clonidine taper and her symptoms significantly improved. Her pressures were borderline controlled and she was instructed to follow up in 3-4 weeks to ensure they did not trend up.   Denies headaches. She was very lightheaded yesterday which may have been related to blood sugars. She states that she feels so much better off the clonidine. Her mouth has drastically improved and she feels she has more energy.   Current HTN meds:  torsemide 100mg  daily lopressor 12.5 mg BID hydralazine 50mg  TID amlodipine 10 mg daily  Previously tried:lisinopril 40 mg daily -increased Scr Clonidine - dry mouth and rebound HTN 2/2 missed doses  BP goal: <130/80 mmHg (new HTN guidelines)  Social History: Never smoker, denies alcohol and illicit drug use.   Diet:endorses dietary indiscretions. Does not use salt at home and stays from packaged foods that have salt in them. Limits caffeinated coffee to one cup.   Exercise:Hasn't exercised lately due to pain in lower back. Wants to start working out at [employer].  Home BP readings: over the last few weeks:  123/66 128/58 124/58 144/64 126/64 133/62  Wt Readings from Last 3 Encounters:  02/03/16 182 lb 8 oz (82.8 kg)  01/27/16 181 lb 6.4 oz (82.3 kg)  12/12/15 185 lb 6.4 oz (84.1 kg)   BP Readings from Last 3 Encounters:  03/02/16 (!) 152/68    02/03/16 128/72  01/27/16 (!) 118/58   Pulse Readings from Last 3 Encounters:  03/02/16 60  02/03/16 70  01/27/16 74    Renal function: CrCl cannot be calculated (Patient's most recent lab result is older than the maximum 21 days allowed.).  Past Medical History:  Diagnosis Date  . Anemia 08/29/2011  . Anxiety   . Arthritis    "legs" (01/22/2013)  . CAD (coronary artery disease)    s/p CABG in 1999  . Cataract   . Chronic kidney disease   . CLL (chronic lymphoblastic leukemia)   . Diastolic heart failure ([place])   . GERD (gastroesophageal reflux disease)   . H/O hiatal hernia   . Heart murmur   . Hyperlipidemia   . Hypertension   . IDDM (insulin dependent diabetes mellitus) ([place])   . PAD (peripheral artery disease) ([place])   . Paroxysmal atrial fibrillation (HCC)    a. identified on ILR as part of StrokeAF study  . Peripheral vascular disease ([place])   . Stroke (cerebrum) [facility])     Current Outpatient Prescriptions on File Prior to Visit  Medication Sig Dispense Refill  . ACCU-CHEK AVIVA PLUS test strip TEST three times a day 100 each 12  . allopurinol (ZYLOPRIM) 100 MG tablet Take 1 tablet (100 mg total) by mouth daily. 90 tablet 3  .  amLODipine (NORVASC) 10 MG tablet TAKE 1 TABLET(10 MG) BY MOUTH DAILY 90 tablet 3  . apixaban (ELIQUIS) 5 MG TABS tablet Take 1 tablet (5 mg total) by mouth 2 (two) times daily. 60 tablet 11  . Ascorbic Acid (VITAMIN C) 100 MG tablet Take 100 mg by mouth daily.    Marland Kitchen atorvastatin (LIPITOR) 80 MG tablet TAKE 1 TABLET BY MOUTH DAILY 90 tablet 2  . carboxymethylcellulose (REFRESH PLUS) 0.5 % SOLN 1 drop 3 (three) times daily as needed.    . fenofibrate (TRICOR) 48 MG tablet Take 1 tablet (48 mg total) by mouth daily. 90 tablet 3  . ferrous sulfate 325 (65 FE) MG tablet Take 1 tablet (325 mg total) by mouth daily with breakfast.  3  . fluticasone (FLONASE) 50 MCG/ACT nasal spray Place 2 sprays into both nostrils daily. 16 g 6  . GNP GARLIC EXTRACT  PO Take 1 tablet by mouth daily.     Marland Kitchen HUMALOG KWIKPEN 100 UNIT/ML KiwkPen ADMINISTER 5 UNITS UNDER THE SKIN THREE TIMES DAILY BEFORE MEALS 15 mL 12  . hydrALAZINE (APRESOLINE) 50 MG tablet Take 1 tablet (50 mg total) by mouth 3 (three) times daily. 90 tablet 11  . LANTUS SOLOSTAR 100 UNIT/ML Solostar Pen ADMINISTER 35 UNITS UNDER THE SKIN DAILY (Patient taking differently: ADMINISTER 37 UNITS UNDER THE SKIN DAILY) 15 mL 12  . Melatonin 10 MG CAPS Take 10 mg by mouth at bedtime as needed (sleep).     . metoprolol tartrate (LOPRESSOR) 25 MG tablet TAKE 1/2 TABLET BY MOUTH TWICE DAILY 90 tablet 3  . Multiple Vitamins-Minerals (MULTIVITAMINS THER. W/MINERALS) TABS Take 1 tablet by mouth daily.      Marland Kitchen omega-3 acid ethyl esters (LOVAZA) 1 g capsule Take 2 g by mouth 2 (two) times daily.     . pantoprazole (PROTONIX) 40 MG tablet TAKE 1 TABLET BY MOUTH DAILY 90 tablet 3  . PARoxetine (PAXIL) 20 MG tablet TAKE 1 TABLET BY MOUTH EVERY MORNING 90 tablet 3  . torsemide (DEMADEX) 100 MG tablet Take 100 mg by mouth daily.    . traMADol (ULTRAM) 50 MG tablet Take 50 mg by mouth every 6 (six) hours as needed.     No current facility-administered medications on file prior to visit.     Allergies  Allergen Reactions  . Amoxicillin Rash    Has patient had a PCN reaction causing immediate rash, facial/tongue/throat swelling, SOB or lightheadedness with hypotension: No Has patient had a PCN reaction causing severe rash involving mucus membranes or skin necrosis: No Has patient had a PCN reaction that required hospitalization: No Has patient had a PCN reaction occurring within the last 10 years: No If all of the above answers are "NO", then may proceed with Cephalosporin use.   . Cephalexin Rash  . Codeine Phosphate Rash    Blood pressure (!) 152/68, pulse 60, SpO2 97 %.   Assessment/Plan: Hypertension: BP today elevated in the office, but home pressures are mostly controlled <130/80. Will have her  continue the same medications and call if pressures trend >130/80 consistently. She will follow up with Dr. Irish Lack as scheduled and hypertension clinic as needed.   Thank you, Lelan Pons. Patterson Hammersmith, [facility]  03/02/2016 11:24 AM

## 2016-03-02 ENCOUNTER — Encounter: Payer: Self-pay | Admitting: Pharmacist

## 2016-03-02 ENCOUNTER — Ambulatory Visit (INDEPENDENT_AMBULATORY_CARE_PROVIDER_SITE_OTHER): Payer: Medicare Other | Admitting: Pharmacist

## 2016-03-02 VITALS — BP 152/68 | HR 60

## 2016-03-02 DIAGNOSIS — I10 Essential (primary) hypertension: Secondary | ICD-10-CM | POA: Diagnosis not present

## 2016-03-02 NOTE — Patient Instructions (Signed)
Return for a follow up appointment as scheduled with Dr. Irish Lack  Check your blood pressure at home daily (if able) and keep record of the readings.  Take your BP meds as follows: Continue all medications   Bring all of your meds, your BP cuff and your record of home blood pressures to your next appointment.  Exercise as you're able, try to walk approximately 30 minutes per day.  Keep salt intake to a minimum, especially watch canned and prepared boxed foods.  Eat more fresh fruits and vegetables and fewer canned items.  Avoid eating in fast food restaurants.    HOW TO TAKE YOUR BLOOD PRESSURE: . Rest 5 minutes before taking your blood pressure. .  Don't smoke or drink caffeinated beverages for at least 30 minutes before. . Take your blood pressure before (not after) you eat. . Sit comfortably with your back supported and both feet on the floor (don't cross your legs). . Elevate your arm to heart level on a table or a desk. . Use the proper sized cuff. It should fit smoothly and snugly around your bare upper arm. There should be enough room to slip a fingertip under the cuff. The bottom edge of the cuff should be 1 inch above the crease of the elbow. . Ideally, take 3 measurements at one sitting and record the average.

## 2016-03-07 ENCOUNTER — Other Ambulatory Visit: Payer: Self-pay | Admitting: Family Medicine

## 2016-03-22 ENCOUNTER — Other Ambulatory Visit: Payer: Self-pay | Admitting: Family Medicine

## 2016-03-22 DIAGNOSIS — M222X1 Patellofemoral disorders, right knee: Secondary | ICD-10-CM | POA: Diagnosis not present

## 2016-03-22 MED ORDER — INSULIN PEN NEEDLE 31G X 5 MM MISC: 3 refills | Status: DC

## 2016-03-22 MED ORDER — GLUCOSE BLOOD VI STRP: ORAL_STRIP | 3 refills | Status: DC

## 2016-03-22 NOTE — Telephone Encounter (Signed)
Pt is calling because her pharmacy [facility] was bought out and her insurance doesn't let her go there. SO she transferred her medications to [place] at [facility]. The only prescriptions that they would not accept as a transfer is her test strips and needles. So she needs a new prescription for these sent to the [facility]. jw

## 2016-03-23 ENCOUNTER — Other Ambulatory Visit: Payer: Self-pay | Admitting: Family Medicine

## 2016-03-23 DIAGNOSIS — Z1231 Encounter for screening mammogram for malignant neoplasm of breast: Secondary | ICD-10-CM

## 2016-03-24 MED ORDER — GLUCOSE BLOOD VI STRP: ORAL_STRIP | 3 refills | Status: DC

## 2016-03-24 MED ORDER — INSULIN PEN NEEDLE 31G X 5 MM MISC: 3 refills | Status: DC

## 2016-03-24 NOTE — Telephone Encounter (Signed)
The prescriptions requested were sent to [facility] rather to [facility]. Please resend them

## 2016-03-24 NOTE — Addendum Note (Signed)
Addended by: [name] on: 03/24/2016 05:35 PM   Modules accepted: Orders

## 2016-03-24 NOTE — Addendum Note (Signed)
Addended by: [name] on: 03/24/2016 01:52 PM   Modules accepted: Orders

## 2016-03-24 NOTE — Telephone Encounter (Signed)
Pharmacy requesting that MD send new rx's for diabetic supplies, previous rx's did not have dx code attached.

## 2016-03-24 NOTE — Addendum Note (Signed)
Addended by: Velora Heckler on: 03/24/2016 04:17 PM   Modules accepted: Orders

## 2016-03-25 DIAGNOSIS — E118 Type 2 diabetes mellitus with unspecified complications: Secondary | ICD-10-CM | POA: Diagnosis not present

## 2016-03-29 ENCOUNTER — Other Ambulatory Visit: Payer: Self-pay | Admitting: Family Medicine

## 2016-03-30 ENCOUNTER — Ambulatory Visit (INDEPENDENT_AMBULATORY_CARE_PROVIDER_SITE_OTHER): Payer: Medicare Other | Admitting: Family Medicine

## 2016-03-30 ENCOUNTER — Encounter: Payer: Self-pay | Admitting: Family Medicine

## 2016-03-30 VITALS — BP 122/50 | HR 65 | Temp 98.1°F | Wt 181.8 lb

## 2016-03-30 DIAGNOSIS — Z23 Encounter for immunization: Secondary | ICD-10-CM

## 2016-03-30 DIAGNOSIS — S60419A Abrasion of unspecified finger, initial encounter: Secondary | ICD-10-CM | POA: Insufficient documentation

## 2016-03-30 DIAGNOSIS — E1149 Type 2 diabetes mellitus with other diabetic neurological complication: Secondary | ICD-10-CM

## 2016-03-30 DIAGNOSIS — I10 Essential (primary) hypertension: Secondary | ICD-10-CM | POA: Diagnosis not present

## 2016-03-30 DIAGNOSIS — I480 Paroxysmal atrial fibrillation: Secondary | ICD-10-CM | POA: Diagnosis not present

## 2016-03-30 DIAGNOSIS — E1142 Type 2 diabetes mellitus with diabetic polyneuropathy: Secondary | ICD-10-CM

## 2016-03-30 DIAGNOSIS — I5022 Chronic systolic (congestive) heart failure: Secondary | ICD-10-CM

## 2016-03-30 DIAGNOSIS — E118 Type 2 diabetes mellitus with unspecified complications: Secondary | ICD-10-CM | POA: Diagnosis not present

## 2016-03-30 DIAGNOSIS — C9110 Chronic lymphocytic leukemia of B-cell type not having achieved remission: Secondary | ICD-10-CM

## 2016-03-30 DIAGNOSIS — N184 Chronic kidney disease, stage 4 (severe): Secondary | ICD-10-CM | POA: Diagnosis not present

## 2016-03-30 LAB — RENAL FUNCTION PANEL
Albumin: 3.9 g/dL (ref 3.6–5.1)
BUN: 74 mg/dL — ABNORMAL HIGH (ref 7–25)
CALCIUM: 9.3 mg/dL (ref 8.6–10.4)
CO2: 30 mmol/L (ref 20–31)
Chloride: 104 mmol/L (ref 98–110)
Creat: 3.18 mg/dL — ABNORMAL HIGH (ref 0.60–0.93)
GLUCOSE: 128 mg/dL — AB (ref 65–99)
PHOSPHORUS: 4.3 mg/dL (ref 2.1–4.3)
Potassium: 4.2 mmol/L (ref 3.5–5.3)
Sodium: 144 mmol/L (ref 135–146)

## 2016-03-30 LAB — FERRITIN: Ferritin: 272 ng/mL (ref 20–288)

## 2016-03-30 LAB — POCT GLYCOSYLATED HEMOGLOBIN (HGB A1C): HEMOGLOBIN A1C: 6

## 2016-03-30 LAB — CBC
HEMATOCRIT: 34.1 % — AB (ref 35.0–45.0)
Hemoglobin: 11 g/dL — ABNORMAL LOW (ref 11.7–15.5)
MCH: 31.3 pg (ref 27.0–33.0)
MCHC: 32.3 g/dL (ref 32.0–36.0)
MCV: 97.2 fL (ref 80.0–100.0)
MPV: 9 fL (ref 7.5–12.5)
Platelets: 358 10*3/uL (ref 140–400)
RBC: 3.51 MIL/uL — ABNORMAL LOW (ref 3.80–5.10)
RDW: 16 % — ABNORMAL HIGH (ref 11.0–15.0)
WBC: 7 10*3/uL (ref 3.8–10.8)

## 2016-03-30 MED ORDER — INSULIN PEN NEEDLE 31G X 5 MM MISC: 3 refills | Status: DC

## 2016-03-30 NOTE — Assessment & Plan Note (Signed)
Booster tetanus.

## 2016-03-30 NOTE — Patient Instructions (Signed)
I will call with lab tests.  Remind me to put letter up front so you can take to Dr. [name]. Tetanus shot today. Everything that I see looks great.  The big question will be your creatinine.

## 2016-03-30 NOTE — Assessment & Plan Note (Signed)
Great control. 

## 2016-03-30 NOTE — Assessment & Plan Note (Signed)
Reorder needles

## 2016-03-30 NOTE — Assessment & Plan Note (Signed)
Recheck CBC. 

## 2016-03-30 NOTE — Assessment & Plan Note (Signed)
Stable on meds.  Recheck CBC

## 2016-03-30 NOTE — Progress Notes (Signed)
   Subjective:    Patient ID: Christine Santos, female    DOB: [birth year], 71 y.o.   MRN: [id number]  HPI With the help of cardiology and renal, we are making nice progress in both her hypertension and DM:  Issues 1. DM AIC today is 6.0.  No hypoglycemia spells. Tolerating current meds well.  Needs Rx for needles which I sent. 2. Recheck Creat.  Hopefully better now that we are controling risk factors of hypertension and diabetes. 3. CLL asymptomatic.  Will recheck CBC 4. CHF.  No dyspnea or edema.   5. A fib on eliquis, stable.  Has previous GI blood loss.  Needs recheck hemoglobin. 6. Has abrasion, left small finger  Review of Systems     Objective:   Physical Exam Lungs clear Cardiac RRR without m or g Abd benign Ext trace bilateral edema. Small abrasion, left small finger.       Assessment & Plan:

## 2016-03-30 NOTE — Assessment & Plan Note (Signed)
Symptomatically stable.  Will check iron since deficiency can worsen course.

## 2016-03-30 NOTE — Assessment & Plan Note (Signed)
Recheck creat. 

## 2016-03-30 NOTE — Assessment & Plan Note (Signed)
Great control.  Thanks to cards.

## 2016-03-31 ENCOUNTER — Encounter: Payer: Self-pay | Admitting: Family Medicine

## 2016-03-31 DIAGNOSIS — M1711 Unilateral primary osteoarthritis, right knee: Secondary | ICD-10-CM | POA: Diagnosis not present

## 2016-04-07 DIAGNOSIS — N184 Chronic kidney disease, stage 4 (severe): Secondary | ICD-10-CM | POA: Diagnosis not present

## 2016-04-07 DIAGNOSIS — I10 Essential (primary) hypertension: Secondary | ICD-10-CM | POA: Diagnosis not present

## 2016-04-07 DIAGNOSIS — R600 Localized edema: Secondary | ICD-10-CM | POA: Diagnosis not present

## 2016-04-07 DIAGNOSIS — N2581 Secondary hyperparathyroidism of renal origin: Secondary | ICD-10-CM | POA: Diagnosis not present

## 2016-04-11 ENCOUNTER — Ambulatory Visit
Admission: RE | Admit: 2016-04-11 | Discharge: 2016-04-11 | Disposition: A | Discharge status: Home / Self Care | Payer: Medicare Other | Source: Ambulatory Visit | Attending: Family Medicine | Admitting: Family Medicine

## 2016-04-11 DIAGNOSIS — Z1231 Encounter for screening mammogram for malignant neoplasm of breast: Secondary | ICD-10-CM

## 2016-04-22 DIAGNOSIS — N184 Chronic kidney disease, stage 4 (severe): Secondary | ICD-10-CM | POA: Diagnosis not present

## 2016-05-12 DIAGNOSIS — S40011A Contusion of right shoulder, initial encounter: Secondary | ICD-10-CM | POA: Diagnosis not present

## 2016-05-12 DIAGNOSIS — M25531 Pain in right wrist: Secondary | ICD-10-CM | POA: Diagnosis not present

## 2016-05-12 DIAGNOSIS — S60222A Contusion of left hand, initial encounter: Secondary | ICD-10-CM | POA: Diagnosis not present

## 2016-05-12 DIAGNOSIS — E119 Type 2 diabetes mellitus without complications: Secondary | ICD-10-CM | POA: Diagnosis not present

## 2016-05-12 DIAGNOSIS — S52134A Nondisplaced fracture of neck of right radius, initial encounter for closed fracture: Secondary | ICD-10-CM | POA: Diagnosis not present

## 2016-05-12 DIAGNOSIS — S52121A Displaced fracture of head of right radius, initial encounter for closed fracture: Secondary | ICD-10-CM | POA: Diagnosis not present

## 2016-05-12 DIAGNOSIS — S1989XA Other specified injuries of other specified part of neck, initial encounter: Secondary | ICD-10-CM | POA: Diagnosis not present

## 2016-05-12 DIAGNOSIS — S098XXA Other specified injuries of head, initial encounter: Secondary | ICD-10-CM | POA: Diagnosis not present

## 2016-05-12 DIAGNOSIS — M79631 Pain in right forearm: Secondary | ICD-10-CM | POA: Diagnosis not present

## 2016-05-12 DIAGNOSIS — M79642 Pain in left hand: Secondary | ICD-10-CM | POA: Diagnosis not present

## 2016-05-12 DIAGNOSIS — I10 Essential (primary) hypertension: Secondary | ICD-10-CM | POA: Diagnosis not present

## 2016-05-16 ENCOUNTER — Encounter: Payer: Self-pay | Admitting: Nurse Practitioner

## 2016-05-16 ENCOUNTER — Ambulatory Visit (INDEPENDENT_AMBULATORY_CARE_PROVIDER_SITE_OTHER): Payer: Medicare Other | Admitting: Nurse Practitioner

## 2016-05-16 ENCOUNTER — Encounter (INDEPENDENT_AMBULATORY_CARE_PROVIDER_SITE_OTHER): Payer: Self-pay

## 2016-05-16 VITALS — BP 150/68 | HR 74 | Wt 181.6 lb

## 2016-05-16 DIAGNOSIS — E7800 Pure hypercholesterolemia, unspecified: Secondary | ICD-10-CM | POA: Diagnosis not present

## 2016-05-16 DIAGNOSIS — I480 Paroxysmal atrial fibrillation: Secondary | ICD-10-CM | POA: Diagnosis not present

## 2016-05-16 DIAGNOSIS — I639 Cerebral infarction, unspecified: Secondary | ICD-10-CM | POA: Diagnosis not present

## 2016-05-16 DIAGNOSIS — I10 Essential (primary) hypertension: Secondary | ICD-10-CM | POA: Diagnosis not present

## 2016-05-16 DIAGNOSIS — I6381 Other cerebral infarction due to occlusion or stenosis of small artery: Secondary | ICD-10-CM

## 2016-05-16 NOTE — Patient Instructions (Addendum)
Stressed the importance of management of risk factors to prevent further stroke Continue eliquis for secondary stroke prevention and atrial fibrillation Maintain strict control of hypertension with blood pressure goal below 130/90, today's reading 150/68,  continue antihypertensive medications Control of diabetes with hemoglobin A1c below 6.5 followed by primary care most recent hemoglobin A1c 6.0 continue diabetic medications Cholesterol with LDL cholesterol less than 70, followed by primary care,  Continue Lipitor Exercise by walking, slowly increase , eat healthy diet with whole grains,  fresh fruits and vegetables Follow up in 6 months  Stroke Prevention Some health problems and behaviors may make it more likely for you to have a stroke. Below are ways to lessen your risk of having a stroke.  Be active for at least 30 minutes on most or all days.  Do not smoke. Try not to be around others who smoke.  Do not drink too much alcohol.  Do not have more than 2 drinks a day if you are a man.  Do not have more than 1 drink a day if you are a woman and are not pregnant.  Eat healthy foods, such as fruits and vegetables. If you were put on a specific diet, follow the diet as told.  Keep your cholesterol levels under control through diet and medicines. Look for foods that are low in saturated fat, trans fat, cholesterol, and are high in fiber.  If you have diabetes, follow all diet plans and take your medicine as told.  Ask your doctor if you need treatment to lower your blood pressure. If you have high blood pressure (hypertension), follow all diet plans and take your medicine as told by your doctor.  If you are 15-38 years old, have your blood pressure checked every 3-5 years. If you are age 90 or older, have your blood pressure checked every year.  Keep a healthy weight. Eat foods that are low in calories, salt, saturated fat, trans fat, and cholesterol.  Do not take drugs.  Avoid  birth control pills, if this applies. Talk to your doctor about the risks of taking birth control pills.  Talk to your doctor if you have sleep problems (sleep apnea).  Take all medicine as told by your doctor.  You may be told to take aspirin or blood thinner medicine. Take this medicine as told by your doctor.  Understand your medicine instructions.  Make sure any other conditions you have are being taken care of. Get help right away if:  You suddenly lose feeling (you feel numb) or have weakness in your face, arm, or leg.  Your face or eyelid hangs down to one side.  You suddenly feel confused.  You have trouble talking (aphasia) or understanding what people are saying.  You suddenly have trouble seeing in one or both eyes.  You suddenly have trouble walking.  You are dizzy.  You lose your balance or your movements are clumsy (uncoordinated).  You suddenly have a very bad headache and you do not know the cause.  You have new chest pain.  Your heart feels like it is fluttering or skipping a beat (irregular heartbeat). Do not wait to see if the symptoms above go away. Get help right away. Call your local emergency services (911 in [place] Do not drive yourself to the hospital. This information is not intended to replace advice given to you by your health care provider. Make sure you discuss any questions you have with your health care provider. Document Released: 07/12/2011  Document Revised: 06/18/2015 Document Reviewed: 07/13/2012 Elsevier Interactive Patient Education  2017 [employer].

## 2016-05-16 NOTE — Progress Notes (Signed)
[facility]  PATIENT: Christine Santos DOB: 10-31-1945   REASON FOR VISIT: Follow-up for lacunar infarct, atrial fibrillation HISTORY FROM: Patient and sister Christine Hua    HISTORY OF PRESENT ILLNESS:UPDATE 04/23/2018CM  Christine Santos, 71 year old female returns for follow-up after admission in August 2017 for stroke. 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 MRA of the brain showed no significant large vessel stenosis or occlusion. Carotid Doppler no significant extracranial stenosis. She is currently on eliquis for secondary stroke prevention and atrial fibrillation.  She has no bruising or bleeding. She remains on Lipitor for hyperlipidemia without complaints of myalgias. CBGs this morning 84. Most recent hemoglobin A1c 6. [place] out walking and tripped and broke her right elbow. Her right arm is in a sling. Blood pressure in the office today 150/68. She returns for reevaluation   HISTORY 11/16/15 [name] is a pleasant 71 year old Caucasian lady seen today for first office follow-up visit for hospital admission for stroke in August 2017. She is a complaint today by her sister.Christine Wengert Rothis an 88 y.o.femalewith history of CAD (now on ASA, plavix), CLL, CHF, HTN, HLD, IDDM, GERD who presents to the ED for evaluation of blurred vision/double vision and dizziness. She states yesterday at 1000 she noted she felt dizzy but not vertigo. She thought it was her blood sugar and took a glucose pill. This persisted and at 1600 she noted horizontal diplopia and felt off balance due to her vision. Due to not returning to baseline she came to ED today. She had diplopia on upward and horizontal gaze and her Christine Santos states her left eye has a new ptosis. BP is 166/93 which was elevated more than normal. She stated she took her antiplatelets every day. In addition she was  bradycardic while in ED with pulse of 42. She was last known  well 09/03/2015 at 10 AM. Patient was not administered IV t-PA secondary to being outside of the window. Shewas admitted for further evaluation and treatment. MRI scan of the brain showed left midbrain lacunar infarct due to small vessel disease. MRA of the brain showed no significant large vessel stenosis or occlusion. Carotid ultrasound showed no significant extracranial stenosis transversely echo showed normal ejection fraction. LDL cholesterol is mildly elevated 99 mg percent. Hemoglobin A1c in June 2017 was 6.8. Patient was enrolled into the stroke atrial fibrillation trial and was randomized into the loop recorder insertion. She was found to have paroxysmal atrial fibrillation in September 2017 and change from antiplatelet agent to eliquis. She is tolerating it well without bleeding or bruising. She states her blood pressure is usually well controlled but today it is elevated at 160/80. Her fasting sugars are usually in the 90-100 range. She has no new complaints today. She does have lower extremity swelling and I recommend she discuss with her primary physician switching Norvasc to alternative blood pressure agent.    REVIEW OF SYSTEMS: Full 14 system review of systems performed and notable only for those listed, all others are neg:  Constitutional: neg  Cardiovascular: neg Ear/Nose/Throat: neg  Skin: neg Eyes: neg Respiratory: neg Gastroitestinal: neg  Hematology/Lymphatic: neg  Endocrine: neg Musculoskeletal elbow pain on the right recent fracture Allergy/Immunology: neg Neurological: neg Psychiatric: neg Sleep : neg   ALLERGIES: Allergies  Allergen Reactions  . Amoxicillin Rash    Has patient had a PCN reaction causing immediate rash, facial/tongue/throat swelling, SOB or lightheadedness with hypotension: No Has  patient had a PCN reaction causing severe rash involving mucus membranes or skin necrosis: No Has patient had a PCN reaction that required hospitalization: No Has  patient had a PCN reaction occurring within the last 10 years: No If all of the above answers are "NO", then may proceed with Cephalosporin use.   . Cephalexin Rash  . Codeine Phosphate Rash    HOME MEDICATIONS: Outpatient Medications Prior to Visit  Medication Sig Dispense Refill  . allopurinol (ZYLOPRIM) 100 MG tablet Take 1 tablet (100 mg total) by mouth daily. 90 tablet 3  . amLODipine (NORVASC) 10 MG tablet TAKE 1 TABLET(10 MG) BY MOUTH DAILY 90 tablet 3  . apixaban (ELIQUIS) 5 MG TABS tablet Take 1 tablet (5 mg total) by mouth 2 (two) times daily. 60 tablet 11  . Ascorbic Acid (VITAMIN C) 100 MG tablet Take 100 mg by mouth daily.    Marland Kitchen atorvastatin (LIPITOR) 80 MG tablet TAKE 1 TABLET BY MOUTH DAILY 90 tablet 2  . carboxymethylcellulose (REFRESH PLUS) 0.5 % SOLN 1 drop 3 (three) times daily as needed.    . fenofibrate (TRICOR) 48 MG tablet Take 1 tablet (48 mg total) by mouth daily. 90 tablet 3  . ferrous sulfate 325 (65 FE) MG tablet Take 1 tablet (325 mg total) by mouth daily with breakfast.  3  . glucose blood (ACCU-CHEK AVIVA PLUS) test strip TEST three times a day 300 each 3  . GNP GARLIC EXTRACT PO Take 1 tablet by mouth daily.     Marland Kitchen HUMALOG KWIKPEN 100 UNIT/ML KiwkPen ADMINISTER 5 UNITS UNDER THE SKIN THREE TIMES DAILY BEFORE MEALS 15 mL 12  . hydrALAZINE (APRESOLINE) 50 MG tablet Take 1 tablet (50 mg total) by mouth 3 (three) times daily. 90 tablet 11  . Insulin Pen Needle (B-D UF III MINI PEN NEEDLES) 31G X 5 MM MISC as directed once daily.  Uses four needles per day 360 each 3  . LANTUS SOLOSTAR 100 UNIT/ML Solostar Pen ADMINISTER 35 UNITS UNDER THE SKIN DAILY (Patient taking differently: ADMINISTER 37 UNITS UNDER THE SKIN DAILY) 15 mL 12  . Melatonin 10 MG CAPS Take 10 mg by mouth at bedtime as needed (sleep).     . metoprolol tartrate (LOPRESSOR) 25 MG tablet TAKE 1/2 TABLET BY MOUTH TWICE DAILY 90 tablet 0  . Multiple Vitamins-Minerals (MULTIVITAMINS THER. W/MINERALS) TABS  Take 1 tablet by mouth daily.      Marland Kitchen omega-3 acid ethyl esters (LOVAZA) 1 g capsule Take 2 g by mouth 2 (two) times daily.     . pantoprazole (PROTONIX) 40 MG tablet TAKE 1 TABLET BY MOUTH DAILY 90 tablet 3  . PARoxetine (PAXIL) 20 MG tablet TAKE 1 TABLET BY MOUTH EVERY MORNING 90 tablet 3  . torsemide (DEMADEX) 100 MG tablet Take 100 mg by mouth daily.    . traMADol (ULTRAM) 50 MG tablet TAKE 1 TABLET BY MOUTH FOUR TIMES DAILY 360 tablet 1   No facility-administered medications prior to visit.     PAST MEDICAL HISTORY: Past Medical History:  Diagnosis Date  . Anemia 08/29/2011  . Anxiety   . Arthritis    "legs" (01/22/2013)  . CAD (coronary artery disease)    s/p CABG in 1999  . Cataract   . Chronic kidney disease   . CLL (chronic lymphoblastic leukemia)   . Diastolic heart failure ([place])   . GERD (gastroesophageal reflux disease)   . H/O hiatal hernia   . Heart murmur   . Hyperlipidemia   .  Hypertension   . IDDM (insulin dependent diabetes mellitus) ([place])   . PAD (peripheral artery disease) ([place])   . Paroxysmal atrial fibrillation ([facility])    a. identified on ILR as part of StrokeAF study  . Peripheral vascular disease ([place])   . Stroke (cerebrum) ([place])     PAST SURGICAL HISTORY: Past Surgical History:  Procedure Laterality Date  . ANGIOPLASTY / STENTING FEMORAL Right 09/2010   SFA/notes 11/25/2010 (01/22/2013)  . ANGIOPLASTY / STENTING FEMORAL Left 11/2010   SFA/notes 11/25/2010 (01/22/2013)  . ANGIOPLASTY / STENTING ILIAC     [name] 11/25/2010 (01/22/2013)  . CATARACT EXTRACTION W/ INTRAOCULAR LENS  IMPLANT, BILATERAL Bilateral ?2011  . CHOLECYSTECTOMY  1993  . CORONARY ARTERY BYPASS GRAFT  10/1994   "CABG X3"  . EP IMPLANTABLE DEVICE N/A 09/11/2015   Procedure: Loop Recorder Insertion;  Surgeon: Thompson Grayer, MD;  Location: [facility];  Service: Cardiovascular;  Laterality: N/A;  . HEEL SPUR EXCISION Bilateral 1970's  . LOWER EXTREMITY ANGIOGRAM N/A 01/22/2013    Procedure: LOWER EXTREMITY ANGIOGRAM;  Surgeon: [name], MD;  Location: [facility] CATH LAB;  Service: Cardiovascular;  Laterality: N/A;  . SHOULDER OPEN ROTATOR CUFF REPAIR Bilateral 1990's   "2 times on 1 side; once on the other"  . TRANSLUMINAL ATHERECTOMY FEMORAL ARTERY Right 01/22/2013   & balloon  . TUBAL LIGATION  1980's    FAMILY HISTORY: Family History  Problem Relation Age of Onset  . Heart disease Mother   . Heart attack Mother   . Hypertension Mother   . Kidney disease Mother   . Pneumonia Father   . Heart attack Father   . Hyperlipidemia Father   . Diabetes Sister   . Breast cancer Sister   . Arthritis Brother   . Diabetes Brother   . Heart attack Sister   . Obesity Sister   . Hypertension Sister   . Heart attack Sister   . Cancer Sister     breast  . Hyperlipidemia Sister   . Hypertension Sister   . Hyperlipidemia Sister   . Breast cancer Maternal Grandmother     SOCIAL HISTORY: Social History   Social History  . Marital status: Divorced    Spouse name: N/A  . Number of children: 2  . Years of education: 12   Occupational History  . DISABILITY Other    Worked at [employer] Topics  . Smoking status: Never Smoker  . Smokeless tobacco: Never Used  . Alcohol use No  . Drug use: No  . Sexual activity: Not Currently   Other Topics Concern  . Not on file   Social History Narrative   Health Care POA:    Emergency Contact: sister, Christine Santos (h) [phone]   End of Life Plan:    Who lives with you: female friend   Any pets: none   Diet: Pt has a varied diet of protein, starch, vegetables   Exercise: Pt does not have any regular exercise routine.   Seatbelts: Pt reports wearing seatbelt when in vehicles.    Sun Exposure/Protection: Pt reports using sun screen.   Hobbies: going to lake, biking, blue grass music           PHYSICAL EXAM  Vitals:   05/16/16 1250  BP: (!) 150/68  Pulse: 74  Weight: 181 lb 9.6 oz  (82.4 kg)   Body mass index is 29.99 kg/m.  Generalized: Well developed, in no acute distress  Head: normocephalic and atraumatic,. Oropharynx benign  Neck: Supple, no carotid bruits  Cardiac: Regular rate rhythm, no murmur  Musculoskeletal: No deformity   Neurological examination   Mentation: Alert oriented to time, place, history taking. Attention span and concentration appropriate. Recent and remote memory intact.  Follows all commands speech and language fluent.   Cranial nerve II-XII: Pupils were equal round reactive to light extraocular movements were full, visual field were full on confrontational test. Facial sensation and strength were normal. hearing was intact to finger rubbing bilaterally. Uvula tongue midline. head turning and shoulder shrug were normal and symmetric.Tongue protrusion into cheek strength was normal. Motor: normal bulk and tone, full strength in the BUE, BLE,Except right arm recent fracture  Sensory: normal and symmetric to light touch, pinprick, and  Vibration in the upper and lower extremities  Coordination finger-nose-finger, heel-to-shin bilaterally, no dysmetria Reflexes:  1+ upper lower and symmetric, plantar responses were flexor bilaterally. Gait and Station: Rising up from seated position without assistance, normal stance,  moderate stride, good arm swing, smooth turning, able to perform tiptoe, and heel walking without difficulty. Tandem gait is steady. No assistive device   DIAGNOSTIC DATA (LABS, IMAGING, TESTING) - I reviewed patient records, labs, notes, testing and imaging myself where available.  Lab Results  Component Value Date   WBC 7.0 03/30/2016   HGB 11.0 (L) 03/30/2016   HCT 34.1 (L) 03/30/2016   MCV 97.2 03/30/2016   PLT 358 03/30/2016      Component Value Date/Time   NA 144 03/30/2016 0938   NA 139 09/08/2015 1230   K 4.2 03/30/2016 0938   K 5.1 09/08/2015 1230   CL 104 03/30/2016 0938   CL 110 (H) 05/28/2012 1259   CO2 30  03/30/2016 0938   CO2 25 09/08/2015 1230   GLUCOSE 128 (H) 03/30/2016 0938   GLUCOSE 226 (H) 09/08/2015 1230   GLUCOSE 202 (H) 05/28/2012 1259   BUN 74 (H) 03/30/2016 0938   BUN 87.2 (H) 09/08/2015 1230   CREATININE 3.18 (H) 03/30/2016 0938   CREATININE 5.5 (HH) 09/08/2015 1230   CALCIUM 9.3 03/30/2016 0938   CALCIUM 8.8 09/08/2015 1230   PROT 5.7 (L) 09/08/2015 1230   ALBUMIN 3.9 03/30/2016 0938   ALBUMIN 2.7 (L) 09/08/2015 1230   AST 27 09/08/2015 1230   ALT 19 09/08/2015 1230   ALKPHOS 98 09/08/2015 1230   BILITOT <0.30 09/08/2015 1230   GFRNONAA 14 (L) 11/25/2015 1402   GFRAA 16 (L) 11/25/2015 1402   Lab Results  Component Value Date   CHOL 169 09/04/2015   HDL 32 (L) 09/04/2015   LDLCALC 99 09/04/2015   TRIG 188 (H) 09/04/2015   CHOLHDL 5.3 09/04/2015   Lab Results  Component Value Date   HGBA1C 6.0 03/30/2016    Lab Results  Component Value Date   TSH 3.397 09/04/2015      ASSESSMENT AND PLAN 18 year Caucasian lady with small left midbrain lacunar infarct in August 2017 due to small vessel disease. She participated in the stroke atrial fibrillation trial and was randomized into the loop recorder insertion arm and was subsequently found to have paroxysmal atrial fibrillation. Multiple vascular risk factors of diabetes, hypertension, hyperlipidemia and paroxysmal   atrial fibrillation  PLAN: Stressed the importance of management of risk factors to prevent further stroke Continue eliquis for secondary stroke prevention and atrial fibrillation Maintain strict control of hypertension with blood pressure goal below 130/90, today's reading 150/68,  continue antihypertensive medications Control of diabetes  with hemoglobin A1c below 6.5 followed by primary care most recent hemoglobin A1c 6.0 continue diabetic medications Cholesterol with LDL cholesterol less than 70, followed by primary care,  Continue Lipitor Exercise by walking, slowly increase , eat healthy diet with  whole grains,  fresh fruits and vegetables Follow up in 6 months This was a visit requiring 25 minutes  Of  medical decision making of high complexity with extensive review of history, hospital chart, counseling and answering questions for both the patient and her sister. Given information on stroke prevention and reviewed this with her.  Dennie Bible, [facility], [facility], APRN  [facility] [address], [place], [place] [address] [phone]

## 2016-05-17 DIAGNOSIS — S52134A Nondisplaced fracture of neck of right radius, initial encounter for closed fracture: Secondary | ICD-10-CM | POA: Diagnosis not present

## 2016-05-17 DIAGNOSIS — S59901A Unspecified injury of right elbow, initial encounter: Secondary | ICD-10-CM | POA: Insufficient documentation

## 2016-05-17 DIAGNOSIS — S52124A Nondisplaced fracture of head of right radius, initial encounter for closed fracture: Secondary | ICD-10-CM | POA: Diagnosis not present

## 2016-05-17 DIAGNOSIS — Z9181 History of falling: Secondary | ICD-10-CM | POA: Diagnosis not present

## 2016-05-18 NOTE — Progress Notes (Signed)
I agree with the above plan 

## 2016-05-26 ENCOUNTER — Encounter: Payer: Self-pay | Admitting: *Deleted

## 2016-05-26 DIAGNOSIS — Z006 Encounter for examination for normal comparison and control in clinical research program: Secondary | ICD-10-CM

## 2016-05-30 ENCOUNTER — Other Ambulatory Visit: Payer: Self-pay | Admitting: Family Medicine

## 2016-06-03 ENCOUNTER — Other Ambulatory Visit: Payer: Self-pay | Admitting: Interventional Cardiology

## 2016-06-07 ENCOUNTER — Other Ambulatory Visit: Payer: Self-pay | Admitting: Family Medicine

## 2016-06-07 DIAGNOSIS — S52124D Nondisplaced fracture of head of right radius, subsequent encounter for closed fracture with routine healing: Secondary | ICD-10-CM | POA: Diagnosis not present

## 2016-06-28 DIAGNOSIS — S52124D Nondisplaced fracture of head of right radius, subsequent encounter for closed fracture with routine healing: Secondary | ICD-10-CM | POA: Diagnosis not present

## 2016-06-28 DIAGNOSIS — M25521 Pain in right elbow: Secondary | ICD-10-CM | POA: Diagnosis not present

## 2016-07-01 DIAGNOSIS — H35033 Hypertensive retinopathy, bilateral: Secondary | ICD-10-CM | POA: Diagnosis not present

## 2016-07-01 DIAGNOSIS — H353132 Nonexudative age-related macular degeneration, bilateral, intermediate dry stage: Secondary | ICD-10-CM | POA: Diagnosis not present

## 2016-07-01 DIAGNOSIS — H26492 Other secondary cataract, left eye: Secondary | ICD-10-CM | POA: Diagnosis not present

## 2016-07-01 DIAGNOSIS — E113393 Type 2 diabetes mellitus with moderate nonproliferative diabetic retinopathy without macular edema, bilateral: Secondary | ICD-10-CM | POA: Diagnosis not present

## 2016-07-01 LAB — HM DIABETES EYE EXAM

## 2016-07-06 ENCOUNTER — Encounter: Payer: Self-pay | Admitting: Family Medicine

## 2016-07-06 ENCOUNTER — Ambulatory Visit (INDEPENDENT_AMBULATORY_CARE_PROVIDER_SITE_OTHER): Payer: Medicare Other | Admitting: Family Medicine

## 2016-07-06 VITALS — BP 140/62 | HR 65 | Temp 98.0°F | Ht 65.0 in | Wt 175.4 lb

## 2016-07-06 DIAGNOSIS — N184 Chronic kidney disease, stage 4 (severe): Secondary | ICD-10-CM | POA: Diagnosis not present

## 2016-07-06 DIAGNOSIS — N3000 Acute cystitis without hematuria: Secondary | ICD-10-CM

## 2016-07-06 DIAGNOSIS — R300 Dysuria: Secondary | ICD-10-CM | POA: Diagnosis not present

## 2016-07-06 DIAGNOSIS — E118 Type 2 diabetes mellitus with unspecified complications: Secondary | ICD-10-CM

## 2016-07-06 DIAGNOSIS — I10 Essential (primary) hypertension: Secondary | ICD-10-CM | POA: Diagnosis not present

## 2016-07-06 DIAGNOSIS — E1149 Type 2 diabetes mellitus with other diabetic neurological complication: Secondary | ICD-10-CM | POA: Diagnosis not present

## 2016-07-06 DIAGNOSIS — N390 Urinary tract infection, site not specified: Secondary | ICD-10-CM | POA: Insufficient documentation

## 2016-07-06 LAB — POCT URINALYSIS DIP (MANUAL ENTRY)
BILIRUBIN UA: NEGATIVE mg/dL
Bilirubin, UA: NEGATIVE
Glucose, UA: NEGATIVE mg/dL
Nitrite, UA: POSITIVE — AB
SPEC GRAV UA: 1.015 (ref 1.010–1.025)
Urobilinogen, UA: 0.2 E.U./dL
pH, UA: 5.5 (ref 5.0–8.0)

## 2016-07-06 LAB — POCT GLYCOSYLATED HEMOGLOBIN (HGB A1C): HEMOGLOBIN A1C: 6

## 2016-07-06 LAB — POCT UA - MICROSCOPIC ONLY

## 2016-07-06 MED ORDER — CIPROFLOXACIN HCL 250 MG PO TABS: 250.0000 mg | ORAL_TABLET | Freq: Two times a day (BID) | ORAL | 0 refills | Status: DC

## 2016-07-06 NOTE — Patient Instructions (Signed)
I sent in a prescription for an antibiotic for the bladder infection. Great job with the weight. Great job with the diabetes. I am interested to get the report from the kidney doctor. See me in 3 months for a diabetes recheck.

## 2016-07-07 NOTE — Assessment & Plan Note (Signed)
I will not duplicate blood work.  Await nephrology visit.

## 2016-07-07 NOTE — Assessment & Plan Note (Signed)
Great control by diet and exercise.

## 2016-07-07 NOTE — Progress Notes (Signed)
   Subjective:    Patient ID: Christine Santos, female    DOB: 08/02/1945, 71 y.o.   MRN: [id number]  HPI 3 month check of diabetes and other problems.  Issues: 1. DM.  Great at following her BS at home.  All doing well.  A1C=6.0 2. Also brings in blood pressure list.  Again all running good.   3. CKD followed by renal.  They have ordered blood work for next week. 4. Urgency, feels like she has a bladder infecion.  Low, midline back pain.  No fever, chills, or nausea. 5. Feel at [facility] and fractured right proximal forearm.  Healing well.  She does not have FROM for supination/pronation.  Followed by ortho.     Review of Systems     Objective:   Physical Exam VS noted. Lungs clear Cardiac RRR without m or gallop. Abd some suprapubic tenderness.  No CVA tenderness. Limited supination and pronation of right arm.  Good flexion and extension of elbow.        Assessment & Plan:

## 2016-07-07 NOTE — Assessment & Plan Note (Signed)
Pen and keflex allergy.  Rx UTI with cipro.

## 2016-07-07 NOTE — Assessment & Plan Note (Signed)
Good control.  She has done a great job with weight loss and exercise.

## 2016-07-21 ENCOUNTER — Telehealth: Payer: Self-pay | Admitting: Family Medicine

## 2016-07-21 DIAGNOSIS — E2839 Other primary ovarian failure: Secondary | ICD-10-CM

## 2016-07-21 DIAGNOSIS — Z8781 Personal history of (healed) traumatic fracture: Secondary | ICD-10-CM

## 2016-07-21 NOTE — Telephone Encounter (Signed)
Will forward to MD to place order for DEXA. Javante Nilsson,CMA

## 2016-07-21 NOTE — Telephone Encounter (Signed)
Pt called back to say please not to schedule appointment next week or on July 9th. ep

## 2016-07-21 NOTE — Telephone Encounter (Signed)
[facility] called and told pt since she recently broke a bone, she needs to have a bone density test done. ep

## 2016-07-21 NOTE — Telephone Encounter (Signed)
Dexa ordered.  Had normal bone density 2013 and this was fracture from fall.  Suspect she is fine.

## 2016-07-22 ENCOUNTER — Ambulatory Visit (INDEPENDENT_AMBULATORY_CARE_PROVIDER_SITE_OTHER): Payer: Medicare Other | Admitting: Interventional Cardiology

## 2016-07-22 VITALS — BP 138/62 | HR 62 | Ht 66.0 in | Wt 178.6 lb

## 2016-07-22 DIAGNOSIS — N184 Chronic kidney disease, stage 4 (severe): Secondary | ICD-10-CM

## 2016-07-22 DIAGNOSIS — I739 Peripheral vascular disease, unspecified: Secondary | ICD-10-CM | POA: Diagnosis not present

## 2016-07-22 DIAGNOSIS — I25119 Atherosclerotic heart disease of native coronary artery with unspecified angina pectoris: Secondary | ICD-10-CM | POA: Diagnosis not present

## 2016-07-22 NOTE — Patient Instructions (Signed)

## 2016-07-22 NOTE — Progress Notes (Signed)
Cardiology Office Note   Date:  07/25/2016   ID:  [name], [name] May 02, 1945, MRN [id number]  PCP:  [name], MD    No chief complaint on file.  CAD  Wt Readings from Last 3 Encounters:  07/22/16 178 lb 9.6 oz (81 kg)  07/06/16 175 lb 6.4 oz (79.6 kg)  05/16/16 181 lb 9.6 oz (82.4 kg)       History of Present Illness: Christine Santos is a 71 y.o. female  who had bilateral LE revascularization. The right leg was treated with PTA and the left SFA was treated with rotational atherectomy severeal years ago.  She ha s a h/o CAD, CABG in 1999,  CKD.  She has had restenosis in her legs but managed medically due to no claudication.    Was treated for CLL with chemo.  Now in remission.  Feels well.  Tolerated Chemo well.    She had a stroke in 8/17 with visual changes.  Visual changes improved.  She had acute on chronic renal failure at that time. Cr up to 5.5.    She has had a fall with a broken arm in 4/18.    Since then, she has been doing ok.    Denies : Chest pain. Dizziness. Leg edema. Nitroglycerin use. Orthopnea. Palpitations. Paroxysmal nocturnal dyspnea. Shortness of breath. Syncope.     Past Medical History:  Diagnosis Date  . Anemia 08/29/2011  . Anxiety   . Arthritis    "legs" (01/22/2013)  . CAD (coronary artery disease)    s/p CABG in 1999  . Cataract   . Chronic kidney disease   . CLL (chronic lymphoblastic leukemia)   . Diastolic heart failure ([place])   . GERD (gastroesophageal reflux disease)   . H/O hiatal hernia   . Heart murmur   . Hyperlipidemia   . Hypertension   . IDDM (insulin dependent diabetes mellitus) ([place])   . PAD (peripheral artery disease) ([place])   . Paroxysmal atrial fibrillation ([facility])    a. identified on ILR as part of StrokeAF study  . Peripheral vascular disease ([place])   . Stroke (cerebrum) [facility])     Past Surgical History:  Procedure Laterality Date  . ANGIOPLASTY / STENTING FEMORAL Right 09/2010   SFA/notes 11/25/2010  (01/22/2013)  . ANGIOPLASTY / STENTING FEMORAL Left 11/2010   SFA/notes 11/25/2010 (01/22/2013)  . ANGIOPLASTY / STENTING ILIAC     [name] 11/25/2010 (01/22/2013)  . CATARACT EXTRACTION W/ INTRAOCULAR LENS  IMPLANT, BILATERAL Bilateral ?2011  . CHOLECYSTECTOMY  1993  . CORONARY ARTERY BYPASS GRAFT  10/1994   "CABG X3"  . EP IMPLANTABLE DEVICE N/A 09/11/2015   Procedure: Loop Recorder Insertion;  Surgeon: Thompson Grayer, MD;  Location: [facility] [facility];  Service: Cardiovascular;  Laterality: N/A;  . HEEL SPUR EXCISION Bilateral 1970's  . LOWER EXTREMITY ANGIOGRAM N/A 01/22/2013   Procedure: LOWER EXTREMITY ANGIOGRAM;  Surgeon: [name], MD;  Location: [facility] CATH LAB;  Service: Cardiovascular;  Laterality: N/A;  . SHOULDER OPEN ROTATOR CUFF REPAIR Bilateral 1990's   "2 times on 1 side; once on the other"  . TRANSLUMINAL ATHERECTOMY FEMORAL ARTERY Right 01/22/2013   & balloon  . TUBAL LIGATION  1980's     Current Outpatient Prescriptions  Medication Sig Dispense Refill  . allopurinol (ZYLOPRIM) 100 MG tablet Take 1 tablet (100 mg total) by mouth daily. 90 tablet 3  . amLODipine (NORVASC) 10 MG tablet TAKE 1 TABLET(10 MG) BY MOUTH  DAILY 90 tablet 3  . apixaban (ELIQUIS) 5 MG TABS tablet Take 1 tablet (5 mg total) by mouth 2 (two) times daily. 60 tablet 11  . Ascorbic Acid (VITAMIN C) 100 MG tablet Take 100 mg by mouth daily.    Marland Kitchen atorvastatin (LIPITOR) 80 MG tablet TAKE 1 TABLET BY MOUTH DAILY 90 tablet 0  . carboxymethylcellulose (REFRESH PLUS) 0.5 % SOLN 1 drop 3 (three) times daily as needed.    . fenofibrate (TRICOR) 48 MG tablet Take 1 tablet (48 mg total) by mouth daily. 90 tablet 3  . ferrous sulfate 325 (65 FE) MG tablet Take 1 tablet (325 mg total) by mouth daily with breakfast.  3  . glucose blood (ACCU-CHEK AVIVA PLUS) test strip TEST three times a day 300 each 3  . GNP GARLIC EXTRACT PO Take 1 tablet by mouth daily.     Marland Kitchen HUMALOG KWIKPEN 100 UNIT/ML KiwkPen ADMINISTER 5  UNITS UNDER THE SKIN THREE TIMES DAILY BEFORE MEALS 15 mL 12  . hydrALAZINE (APRESOLINE) 50 MG tablet Take 1 tablet (50 mg total) by mouth 3 (three) times daily. 90 tablet 11  . Insulin Glargine (LANTUS SOLOSTAR) 100 UNIT/ML Solostar Pen Inject 37 Units into the skin daily at 10 pm.    . Insulin Pen Needle (B-D UF III MINI PEN NEEDLES) 31G X 5 MM MISC as directed once daily.  Uses four needles per day 360 each 3  . Melatonin 10 MG CAPS Take 10 mg by mouth at bedtime as needed (sleep).     . metoprolol tartrate (LOPRESSOR) 25 MG tablet TAKE 1/2 TABLET BY MOUTH TWICE DAILY 90 tablet 3  . Multiple Vitamins-Minerals (MULTIVITAMINS THER. W/MINERALS) TABS Take 1 tablet by mouth daily.      Marland Kitchen omega-3 acid ethyl esters (LOVAZA) 1 g capsule Take 2 g by mouth 2 (two) times daily.     . pantoprazole (PROTONIX) 40 MG tablet TAKE 1 TABLET BY MOUTH DAILY 90 tablet 3  . PARoxetine (PAXIL) 20 MG tablet TAKE 1 TABLET BY MOUTH EVERY MORNING 90 tablet 3  . torsemide (DEMADEX) 100 MG tablet Take 100 mg by mouth daily.    . traMADol (ULTRAM) 50 MG tablet TAKE 1 TABLET BY MOUTH FOUR TIMES DAILY 360 tablet 1   No current facility-administered medications for this visit.     Allergies:   Amoxicillin; Cephalexin; and Codeine phosphate    Social History:  The patient  reports that she has never smoked. She has never used smokeless tobacco. She reports that she does not drink alcohol or use drugs.   Family History:  The patient's family history includes Arthritis in her brother; Breast cancer in her maternal grandmother and sister; Cancer in her sister; Diabetes in her brother and sister; Heart attack in her father, mother, sister, and sister; Heart disease in her mother; Hyperlipidemia in her father, sister, and sister; Hypertension in her mother, sister, and sister; Kidney disease in her mother; Obesity in her sister; Pneumonia in her father.    ROS:  Please see the history of present illness.   Otherwise, review of  systems are positive for worsening kidney function.   All other systems are reviewed and negative.    PHYSICAL EXAM: VS:  BP 138/62   Pulse 62   Ht 5\' 6"  (1.676 m)   Wt 178 lb 9.6 oz (81 kg)   BMI 28.83 kg/m  , BMI Body mass index is 28.83 kg/m. GEN: Well nourished, well developed, in no acute  distress  HEENT: normal  Neck: no JVD, carotid bruits, or masses Cardiac: RRR; murmurs, rubs, or gallops,no edema  Respiratory:  clear to auscultation bilaterally, normal work of breathing GI: soft, nontender, nondistended, + BS MS: no deformity or atrophy  Skin: warm and dry, no rash Neuro:  Strength and sensation are intact Psych: euthymic mood, full affect    Recent Labs: 09/04/2015: TSH 3.397 09/08/2015: ALT 19 03/30/2016: BUN 74; Creat 3.18; Hemoglobin 11.0; Platelets 358; Potassium 4.2; Sodium 144   Lipid Panel    Component Value Date/Time   CHOL 169 09/04/2015 0602   TRIG 188 (H) 09/04/2015 0602   HDL 32 (L) 09/04/2015 0602   CHOLHDL 5.3 09/04/2015 0602   VLDL 38 09/04/2015 0602   LDLCALC 99 09/04/2015 0602     Other studies Reviewed: Additional studies/ records that were reviewed today with results demonstrating: Hospital records reviewed.   ASSESSMENT AND PLAN:  1. CAD: Angina controlled on current medical therapy.  COntinue aggressive secondary prevention. 2. PAD: Claudication under control.  No nonhealing sores on her feet.  3. CRI: Cr improved since the hospital.  Continue to avoid nephrotoxins.    Current medicines are reviewed at length with the patient today.  The patient concerns regarding her medicines were addressed.  The following changes have been made:  No change  Labs/ tests ordered today include:  No orders of the defined types were placed in this encounter.   Recommend 150 minutes/week of aerobic exercise Low fat, low carb, high fiber diet recommended  Disposition:   FU in 1 year   Signed, Larae Grooms, MD  07/25/2016 5:14 PM    [facility] [address], [place], [place]  [address] Phone: [phone]; Fax: [phone]

## 2016-07-25 ENCOUNTER — Encounter: Payer: Self-pay | Admitting: Interventional Cardiology

## 2016-07-25 DIAGNOSIS — E2839 Other primary ovarian failure: Secondary | ICD-10-CM | POA: Insufficient documentation

## 2016-07-25 NOTE — Telephone Encounter (Signed)
LVM for pt to call the office. If pt calls, please inform her that she has an appt at the [facility]) for her bone density on 08/03/2016 (Wednesday). She needs to be there by 11:10 for the scan to be done at 11:30. Ottis Stain, CMA

## 2016-07-25 NOTE — Addendum Note (Signed)
Addended by: [name] on: 07/25/2016 03:52 PM   Modules accepted: Orders

## 2016-07-25 NOTE — Telephone Encounter (Signed)
You cannot make this stuff up.  Here is the sequence of events: 1. Patient fell and broke a bone. 2. Here insurer tells her she needs a bone density test because of this fall and break 3. I order the bone density test using the diagnosis of fall. 4. The insurer will not pay for that diagnosis.  I reordered using the diagnosis of estrogen deficiency.

## 2016-07-25 NOTE — Telephone Encounter (Signed)
Spoke to GI. Medicare will not pay for dexa with a diagnosis code of history of falls. Can [place] Osteopenia or estrogen deficiency. Please advise. Ottis Stain, CMA

## 2016-07-28 ENCOUNTER — Encounter: Payer: Self-pay | Admitting: Family Medicine

## 2016-08-01 DIAGNOSIS — N2581 Secondary hyperparathyroidism of renal origin: Secondary | ICD-10-CM | POA: Diagnosis not present

## 2016-08-01 DIAGNOSIS — R600 Localized edema: Secondary | ICD-10-CM | POA: Diagnosis not present

## 2016-08-01 DIAGNOSIS — I10 Essential (primary) hypertension: Secondary | ICD-10-CM | POA: Diagnosis not present

## 2016-08-01 DIAGNOSIS — N184 Chronic kidney disease, stage 4 (severe): Secondary | ICD-10-CM | POA: Diagnosis not present

## 2016-08-03 ENCOUNTER — Ambulatory Visit
Admission: RE | Admit: 2016-08-03 | Discharge: 2016-08-03 | Disposition: A | Discharge status: Home / Self Care | Payer: Medicare Other | Source: Ambulatory Visit | Attending: Family Medicine | Admitting: Family Medicine

## 2016-08-03 ENCOUNTER — Other Ambulatory Visit (INDEPENDENT_AMBULATORY_CARE_PROVIDER_SITE_OTHER): Payer: Medicare Other | Admitting: Family Medicine

## 2016-08-03 DIAGNOSIS — Z780 Asymptomatic menopausal state: Secondary | ICD-10-CM | POA: Diagnosis not present

## 2016-08-03 DIAGNOSIS — Z13820 Encounter for screening for osteoporosis: Secondary | ICD-10-CM | POA: Diagnosis not present

## 2016-08-03 DIAGNOSIS — E2839 Other primary ovarian failure: Secondary | ICD-10-CM

## 2016-08-03 DIAGNOSIS — N3000 Acute cystitis without hematuria: Secondary | ICD-10-CM | POA: Diagnosis not present

## 2016-08-03 LAB — POCT UA - MICROSCOPIC ONLY

## 2016-08-03 LAB — POCT URINALYSIS DIP (MANUAL ENTRY)
BILIRUBIN UA: NEGATIVE
GLUCOSE UA: NEGATIVE mg/dL
Ketones, POC UA: NEGATIVE mg/dL
Nitrite, UA: NEGATIVE
Protein Ur, POC: 100 mg/dL — AB
RBC UA: NEGATIVE
SPEC GRAV UA: 1.01 (ref 1.010–1.025)
Urobilinogen, UA: NEGATIVE E.U./dL — AB
pH, UA: 5.5 (ref 5.0–8.0)

## 2016-08-03 NOTE — Addendum Note (Signed)
Addended by: [place] Pink on: 08/03/2016 02:58 PM   Modules accepted: Orders

## 2016-08-03 NOTE — Assessment & Plan Note (Signed)
Having back pain, but not much dysuria.  Pain similar to previous UTI.  She will come for lab appointment to give urine specimen.

## 2016-08-08 ENCOUNTER — Ambulatory Visit: Payer: Medicare Other | Admitting: *Deleted

## 2016-08-08 DIAGNOSIS — I639 Cerebral infarction, unspecified: Secondary | ICD-10-CM | POA: Diagnosis not present

## 2016-08-08 NOTE — Progress Notes (Signed)
[facility] Summary Report / Loop Recorder 

## 2016-08-16 ENCOUNTER — Other Ambulatory Visit: Payer: Self-pay | Admitting: *Deleted

## 2016-08-16 ENCOUNTER — Encounter: Payer: Self-pay | Admitting: *Deleted

## 2016-08-16 ENCOUNTER — Ambulatory Visit (INDEPENDENT_AMBULATORY_CARE_PROVIDER_SITE_OTHER): Payer: Medicare Other | Admitting: *Deleted

## 2016-08-16 ENCOUNTER — Encounter: Payer: Self-pay | Admitting: Podiatry

## 2016-08-16 ENCOUNTER — Ambulatory Visit (INDEPENDENT_AMBULATORY_CARE_PROVIDER_SITE_OTHER): Payer: Medicare Other | Admitting: Podiatry

## 2016-08-16 VITALS — BP 148/66 | HR 65

## 2016-08-16 VITALS — BP 160/60 | HR 58 | Temp 98.4°F | Ht 65.0 in | Wt 175.0 lb

## 2016-08-16 DIAGNOSIS — M2011 Hallux valgus (acquired), right foot: Secondary | ICD-10-CM | POA: Diagnosis not present

## 2016-08-16 DIAGNOSIS — E1142 Type 2 diabetes mellitus with diabetic polyneuropathy: Secondary | ICD-10-CM

## 2016-08-16 DIAGNOSIS — M2041 Other hammer toe(s) (acquired), right foot: Secondary | ICD-10-CM | POA: Diagnosis not present

## 2016-08-16 DIAGNOSIS — M2012 Hallux valgus (acquired), left foot: Secondary | ICD-10-CM | POA: Diagnosis not present

## 2016-08-16 DIAGNOSIS — Z0000 Encounter for general adult medical examination without abnormal findings: Secondary | ICD-10-CM | POA: Diagnosis not present

## 2016-08-16 MED ORDER — INSULIN LISPRO 100 UNIT/ML (KWIKPEN): PEN_INJECTOR | SUBCUTANEOUS | 3 refills | Status: DC

## 2016-08-16 NOTE — Patient Instructions (Addendum)
Christine Santos, Thank you for taking time to come for yourMedicare Wellness Visit. I appreciate your ongoing commitment to your health goals. Please review the following plan we discussed and let me know if I can assist you in the future.   These are the goals we discussed:  Goals    . Blood Pressure < 150/90    . Exercise 5x per week (30 min per time) (pt-stated)          Cardiologist recommended this also      What are high triglycerides?-Triglycerides are fat-like substances in the blood. Everyone has them, but some people have too much of them. This can cause high levels of triglycerides in the blood, also called "high triglycerides." Compared with people who have normal triglycerides, people with high triglycerides can have a higher risk of heart attacks, strokes, and other health problems.  People with very high triglycerides can get inflammation in the pancreas. The pancreas is an organ that makes hormones and fluids to help the body break down food. When the pancreas gets inflamed, it can cause serious health problems.  What should my triglyceride level be?-Ask your doctor or nurse what your triglyceride level should be. In general, levels are:  ?Normal - Less than 150 mg/dL (If you live outside the [place], triglycerides are measured differently. The normal level is less than 1.7 mmol/L.)  ?A little bit high - 150 to 499 mg/dL (1.7 to 5.6 mmol/L)   ?Moderately high - 500 to 886 mg/dL (5.6 to 10.0 mmol/L)  ?Very high - Greater than 886 mg/dL (10.0 mmol/L)  Am I at higher risk for heart attack or stroke?-Yes. Having high triglycerides increases your risk of heart attacks and stroke. But this is just 1 of many things that can increase your risk. You are also at higher risk if you:  ?Smoke cigarettes  ?Have high blood pressure  ?Are overweight  ?Have a parent, sister, or brother who got heart disease at a young age Annamaria Boots, in this case, means younger than 97 for men  and younger than 76 for women.)  ?Are a man - Women are at risk too, but men have a higher risk.  ?Are older  ?Have diabetes - Especially if you cannot control your blood sugar well.  Your doctor can talk to you about your personal risk of having a heart attack or stroke.  There are things you can do to lower your risk.   Should I take medicine to lower my triglycerides?-Not everyone who has high triglycerides needs medicines to lower them. Your doctor will decide if you need medicine. It depends on your age, family history, and other health concerns.   Medicines can include:  ?Medicines to lower triglyceride levels - These include fenofibrate (sample brand names: Antara, Fenoglide), nicotinic acid (sample brand names: Niacor, Niaspan), or fish oil (brand name: Lovaza).  ?Statins - These medicines can reduce the risk of a heart attack or stroke. They are used to lower cholesterol levels in the body. Many people with high triglycerides also have high cholesterol.  The medicine you take will depend on your triglyceride levels and other factors. If your triglycerides are very high, you might need more than 1 medicine.  Can I lower my triglycerides without medicines?-Yes, you might be able to lower high triglycerides if you:  ?Lose weight (if you are overweight)  ?Get regular exercise  ?Avoid foods and drinks with a lot of sugar and carbohydrates - These include white bread, fruit  juice, soda, and sweets.  ?Avoid red meat, butter, fried foods, cheese, oils, and nuts - This can help if your triglycerides are over 500.  ?Limit alcohol - In you are a man, have 2 drinks a day or less. If you are a woman, have 1 drink a day or less. If your triglycerides are over 500, ask your doctor or nurse if it is safe to drink alcohol.     Diabetes and Foot Care Diabetes may cause you to have problems because of poor blood supply (circulation) to your feet and legs. This may cause the skin on  your feet to become thinner, break easier, and heal more slowly. Your skin may become dry, and the skin may peel and crack. You may also have nerve damage in your legs and feet causing decreased feeling in them. You may not notice minor injuries to your feet that could lead to infections or more serious problems. Taking care of your feet is one of the most important things you can do for yourself. Follow these instructions at home:  Wear shoes at all times, even in the house. Do not go barefoot. Bare feet are easily injured.  Check your feet daily for blisters, cuts, and redness. If you cannot see the bottom of your feet, use a mirror or ask someone for help.  Wash your feet with warm water (do not use hot water) and mild soap. Then pat your feet and the areas between your toes until they are completely dry. Do not soak your feet as this can dry your skin.  Apply a moisturizing lotion or petroleum jelly (that does not contain alcohol and is unscented) to the skin on your feet and to dry, brittle toenails. Do not apply lotion between your toes.  Trim your toenails straight across. Do not dig under them or around the cuticle. File the edges of your nails with an emery board or nail file.  Do not cut corns or calluses or try to remove them with medicine.  Wear clean socks or stockings every day. Make sure they are not too tight. Do not wear knee-high stockings since they may decrease blood flow to your legs.  Wear shoes that fit properly and have enough cushioning. To break in new shoes, wear them for just a few hours a day. This prevents you from injuring your feet. Always look in your shoes before you put them on to be sure there are no objects inside.  Do not cross your legs. This may decrease the blood flow to your feet.  If you find a minor scrape, cut, or break in the skin on your feet, keep it and the skin around it clean and dry. These areas may be cleansed with mild soap and water. Do not  cleanse the area with peroxide, alcohol, or iodine.  When you remove an adhesive bandage, be sure not to damage the skin around it.  If you have a wound, look at it several times a day to make sure it is healing.  Do not use heating pads or hot water bottles. They may burn your skin. If you have lost feeling in your feet or legs, you may not know it is happening until it is too late.  Make sure your health care provider performs a complete foot exam at least annually or more often if you have foot problems. Report any cuts, sores, or bruises to your health care provider immediately. Contact a health care provider if:  You have an injury that is not healing.  You have cuts or breaks in the skin.  You have an ingrown nail.  You notice redness on your legs or feet.  You feel burning or tingling in your legs or feet.  You have pain or cramps in your legs and feet.  Your legs or feet are numb.  Your feet always feel cold. Get help right away if:  There is increasing redness, swelling, or pain in or around a wound.  There is a red line that goes up your leg.  Pus is coming from a wound.  You develop a fever or as directed by your health care provider.  You notice a bad smell coming from an ulcer or wound. This information is not intended to replace advice given to you by your health care provider. Make sure you discuss any questions you have with your health care provider. Document Released: 01/08/2000 Document Revised: 06/18/2015 Document Reviewed: 06/19/2012 Elsevier Interactive Patient Education  2017 [place] Prevention in the Home Falls can cause injuries. They can happen to people of all ages. There are many things you can do to make your home safe and to help prevent falls. What can I do on the outside of my home?  Regularly fix the edges of walkways and driveways and fix any cracks.  Remove anything that might make you trip as you walk through a door,  such as a raised step or threshold.  Trim any bushes or trees on the path to your home.  Use bright outdoor lighting.  Clear any walking paths of anything that might make someone trip, such as rocks or tools.  Regularly check to see if handrails are loose or broken. Make sure that both sides of any steps have handrails.  Any raised decks and porches should have guardrails on the edges.  Have any leaves, snow, or ice cleared regularly.  Use sand or salt on walking paths during winter.  Clean up any spills in your garage right away. This includes oil or grease spills. What can I do in the bathroom?  Use night lights.  Install grab bars by the toilet and in the tub and shower. Do not use towel bars as grab bars.  Use non-skid mats or decals in the tub or shower.  If you need to sit down in the shower, use a plastic, non-slip stool.  Keep the floor dry. Clean up any water that spills on the floor as soon as it happens.  Remove soap buildup in the tub or shower regularly.  Attach bath mats securely with double-sided non-slip rug tape.  Do not have throw rugs and other things on the floor that can make you trip. What can I do in the bedroom?  Use night lights.  Make sure that you have a light by your bed that is easy to reach.  Do not use any sheets or blankets that are too big for your bed. They should not hang down onto the floor.  Have a firm chair that has side arms. You can use this for support while you get dressed.  Do not have throw rugs and other things on the floor that can make you trip. What can I do in the kitchen?  Clean up any spills right away.  Avoid walking on wet floors.  Keep items that you use a lot in easy-to-reach places.  If you need to reach something above you, use a strong  step stool that has a grab bar.  Keep electrical cords out of the way.  Do not use floor polish or wax that makes floors slippery. If you must use wax, use non-skid  floor wax.  Do not have throw rugs and other things on the floor that can make you trip. What can I do with my stairs?  Do not leave any items on the stairs.  Make sure that there are handrails on both sides of the stairs and use them. Fix handrails that are broken or loose. Make sure that handrails are as long as the stairways.  Check any carpeting to make sure that it is firmly attached to the stairs. Fix any carpet that is loose or worn.  Avoid having throw rugs at the top or bottom of the stairs. If you do have throw rugs, attach them to the floor with carpet tape.  Make sure that you have a light switch at the top of the stairs and the bottom of the stairs. If you do not have them, ask someone to add them for you. What else can I do to help prevent falls?  Wear shoes that: ? Do not have high heels. ? Have rubber bottoms. ? Are comfortable and fit you well. ? Are closed at the toe. Do not wear sandals.  If you use a stepladder: ? Make sure that it is fully opened. Do not climb a closed stepladder. ? Make sure that both sides of the stepladder are locked into place. ? Ask someone to hold it for you, if possible.  Clearly mark and make sure that you can see: ? Any grab bars or handrails. ? First and last steps. ? Where the edge of each step is.  Use tools that help you move around (mobility aids) if they are needed. These include: ? Canes. ? Walkers. ? Scooters. ? Crutches.  Turn on the lights when you go into a dark area. Replace any light bulbs as soon as they burn out.  Set up your furniture so you have a clear path. Avoid moving your furniture around.  If any of your floors are uneven, fix them.  If there are any pets around you, be aware of where they are.  Review your medicines with your doctor. Some medicines can make you feel dizzy. This can increase your chance of falling. Ask your doctor what other things that you can do to help prevent falls. This  information is not intended to replace advice given to you by your health care provider. Make sure you discuss any questions you have with your health care provider. Document Released: 11/06/2008 Document Revised: 06/18/2015 Document Reviewed: 02/14/2014 Elsevier Interactive Patient Education  2018 [place] Maintenance, Female Adopting a healthy lifestyle and getting preventive care can go a long way to promote health and wellness. Talk with your health care provider about what schedule of regular examinations is right for you. This is a good chance for you to check in with your provider about disease prevention and staying healthy. In between checkups, there are plenty of things you can do on your own. Experts have done a lot of research about which lifestyle changes and preventive measures are most likely to keep you healthy. Ask your health care provider for more information. Weight and diet Eat a healthy diet  Be sure to include plenty of vegetables, fruits, low-fat dairy products, and lean protein.  Do not eat a lot of foods high in solid  fats, added sugars, or salt.  Get regular exercise. This is one of the most important things you can do for your health. ? Most adults should exercise for at least 150 minutes each week. The exercise should increase your heart rate and make you sweat (moderate-intensity exercise). ? Most adults should also do strengthening exercises at least twice a week. This is in addition to the moderate-intensity exercise.  Maintain a healthy weight  Body mass index (BMI) is a measurement that can be used to identify possible weight problems. It estimates body fat based on height and weight. Your health care provider can help determine your BMI and help you achieve or maintain a healthy weight.  For females 76 years of age and older: ? A BMI below 18.5 is considered underweight. ? A BMI of 18.5 to 24.9 is normal. ? A BMI of 25 to 29.9 is considered  overweight. ? A BMI of 30 and above is considered obese.  Watch levels of cholesterol and blood lipids  You should start having your blood tested for lipids and cholesterol at 71 years of age, then have this test every 5 years.  You may need to have your cholesterol levels checked more often if: ? Your lipid or cholesterol levels are high. ? You are older than 72 years of age. ? You are at high risk for heart disease.  Cancer screening Lung Cancer  Lung cancer screening is recommended for adults 25-65 years old who are at high risk for lung cancer because of a history of smoking.  A yearly low-dose CT scan of the lungs is recommended for people who: ? Currently smoke. ? Have quit within the past 15 years. ? Have at least a 30-pack-year history of smoking. A pack year is smoking an average of one pack of cigarettes a day for 1 year.  Yearly screening should continue until it has been 15 years since you quit.  Yearly screening should stop if you develop a health problem that would prevent you from having lung cancer treatment.  Breast Cancer  Practice breast self-awareness. This means understanding how your breasts normally appear and feel.  It also means doing regular breast self-exams. Let your health care provider know about any changes, no matter how small.  If you are in your 20s or 30s, you should have a clinical breast exam (CBE) by a health care provider every 1-3 years as part of a regular health exam.  If you are 85 or older, have a CBE every year. Also consider having a breast X-ray (mammogram) every year.  If you have a family history of breast cancer, talk to your health care provider about genetic screening.  If you are at high risk for breast cancer, talk to your health care provider about having an MRI and a mammogram every year.  Breast cancer gene (BRCA) assessment is recommended for women who have family members with BRCA-related cancers. BRCA-related cancers  include: ? Breast. ? Ovarian. ? Tubal. ? Peritoneal cancers.  Results of the assessment will determine the need for genetic counseling and BRCA1 and BRCA2 testing.  Cervical Cancer Your health care provider may recommend that you be screened regularly for cancer of the pelvic organs (ovaries, uterus, and vagina). This screening involves a pelvic examination, including checking for microscopic changes to the surface of your cervix (Pap test). You may be encouraged to have this screening done every 3 years, beginning at age 26.  For women ages 64-65, health care providers  may recommend pelvic exams and Pap testing every 3 years, or they may recommend the Pap and pelvic exam, combined with testing for human papilloma virus (HPV), every 5 years. Some types of HPV increase your risk of cervical cancer. Testing for HPV may also be done on women of any age with unclear Pap test results.  Other health care providers may not recommend any screening for nonpregnant women who are considered low risk for pelvic cancer and who do not have symptoms. Ask your health care provider if a screening pelvic exam is right for you.  If you have had past treatment for cervical cancer or a condition that could lead to cancer, you need Pap tests and screening for cancer for at least 20 years after your treatment. If Pap tests have been discontinued, your risk factors (such as having a new sexual partner) need to be reassessed to determine if screening should resume. Some women have medical problems that increase the chance of getting cervical cancer. In these cases, your health care provider may recommend more frequent screening and Pap tests.  Colorectal Cancer  This type of cancer can be detected and often prevented.  Routine colorectal cancer screening usually begins at 72 years of age and continues through 72 years of age.  Your health care provider may recommend screening at an earlier age if you have risk factors  for colon cancer.  Your health care provider may also recommend using home test kits to check for hidden blood in the stool.  A small camera at the end of a tube can be used to examine your colon directly (sigmoidoscopy or colonoscopy). This is done to check for the earliest forms of colorectal cancer.  Routine screening usually begins at age 45.  Direct examination of the colon should be repeated every 5-10 years through 72 years of age. However, you may need to be screened more often if early forms of precancerous polyps or small growths are found.  Skin Cancer  Check your skin from head to toe regularly.  Tell your health care provider about any new moles or changes in moles, especially if there is a change in a mole's shape or color.  Also tell your health care provider if you have a mole that is larger than the size of a pencil eraser.  Always use sunscreen. Apply sunscreen liberally and repeatedly throughout the day.  Protect yourself by wearing long sleeves, pants, a wide-brimmed hat, and sunglasses whenever you are outside.  Heart disease, diabetes, and high blood pressure  High blood pressure causes heart disease and increases the risk of stroke. High blood pressure is more likely to develop in: ? People who have blood pressure in the high end of the normal range (130-139/85-89 mm Hg). ? People who are overweight or obese. ? People who are African American.  If you are 55-28 years of age, have your blood pressure checked every 3-5 years. If you are 98 years of age or older, have your blood pressure checked every year. You should have your blood pressure measured twice-once when you are at a hospital or clinic, and once when you are not at a hospital or clinic. Record the average of the two measurements. To check your blood pressure when you are not at a hospital or clinic, you can use: ? An automated blood pressure machine at a pharmacy. ? A home blood pressure monitor.  If  you are between 29 years and 55 years old, ask your health  care provider if you should take aspirin to prevent strokes.  Have regular diabetes screenings. This involves taking a blood sample to check your fasting blood sugar level. ? If you are at a normal weight and have a low risk for diabetes, have this test once every three years after 72 years of age. ? If you are overweight and have a high risk for diabetes, consider being tested at a younger age or more often. Preventing infection Hepatitis B  If you have a higher risk for hepatitis B, you should be screened for this virus. You are considered at high risk for hepatitis B if: ? You were born in a country where hepatitis B is common. Ask your health care provider which countries are considered high risk. ? Your parents were born in a high-risk country, and you have not been immunized against hepatitis B (hepatitis B vaccine). ? You have HIV or AIDS. ? You use needles to inject street drugs. ? You live with someone who has hepatitis B. ? You have had sex with someone who has hepatitis B. ? You get hemodialysis treatment. ? You take certain medicines for conditions, including cancer, organ transplantation, and autoimmune conditions.  Hepatitis C  Blood testing is recommended for: ? Everyone born from [birth year] through 1966. ? Anyone with known risk factors for hepatitis C.  Sexually transmitted infections (STIs)  You should be screened for sexually transmitted infections (STIs) including gonorrhea and chlamydia if: ? You are sexually active and are younger than 72 years of age. ? You are older than 72 years of age and your health care provider tells you that you are at risk for this type of infection. ? Your sexual activity has changed since you were last screened and you are at an increased risk for chlamydia or gonorrhea. Ask your health care provider if you are at risk.  If you do not have HIV, but are at risk, it may be recommended  that you take a prescription medicine daily to prevent HIV infection. This is called pre-exposure prophylaxis (PrEP). You are considered at risk if: ? You are sexually active and do not regularly use condoms or know the HIV status of your partner(s). ? You take drugs by injection. ? You are sexually active with a partner who has HIV.  Talk with your health care provider about whether you are at high risk of being infected with HIV. If you choose to begin PrEP, you should first be tested for HIV. You should then be tested every 3 months for as long as you are taking PrEP. Pregnancy  If you are premenopausal and you may become pregnant, ask your health care provider about preconception counseling.  If you may become pregnant, take 400 to 800 micrograms (mcg) of folic acid every day.  If you want to prevent pregnancy, talk to your health care provider about birth control (contraception). Osteoporosis and menopause  Osteoporosis is a disease in which the bones lose minerals and strength with aging. This can result in serious bone fractures. Your risk for osteoporosis can be identified using a bone density scan.  If you are 104 years of age or older, or if you are at risk for osteoporosis and fractures, ask your health care provider if you should be screened.  Ask your health care provider whether you should take a calcium or vitamin D supplement to lower your risk for osteoporosis.  Menopause may have certain physical symptoms and risks.  Hormone replacement therapy may  reduce some of these symptoms and risks. Talk to your health care provider about whether hormone replacement therapy is right for you. Follow these instructions at home:  Schedule regular health, dental, and eye exams.  Stay current with your immunizations.  Do not use any tobacco products including cigarettes, chewing tobacco, or electronic cigarettes.  If you are pregnant, do not drink alcohol.  If you are  breastfeeding, limit how much and how often you drink alcohol.  Limit alcohol intake to no more than 1 drink per day for nonpregnant women. One drink equals 12 ounces of beer, 5 ounces of wine, or 1 ounces of hard liquor.  Do not use street drugs.  Do not share needles.  Ask your health care provider for help if you need support or information about quitting drugs.  Tell your health care provider if you often feel depressed.  Tell your health care provider if you have ever been abused or do not feel safe at home. This information is not intended to replace advice given to you by your health care provider. Make sure you discuss any questions you have with your health care provider. Document Released: 2020-08-2910 Document Revised: 06/18/2015 Document Reviewed: 10/14/2014 Elsevier Interactive Patient Education  [employer].

## 2016-08-16 NOTE — Progress Notes (Signed)
Subjective:   Christine Santos is a 71 y.o. female who presents with significant other, Billy davis, for Medicare Annual (Subsequent) preventive examination.  Cardiac Risk Factors include: advanced age (>44men, >63 women);diabetes mellitus;dyslipidemia;hypertension;sedentary lifestyle     Objective:     Vitals: BP (!) 170/60 (BP Location: Left Arm, Patient Position: Sitting, Cuff Size: Normal)   Pulse (!) 58   Temp 98.4 F (36.9 C) (Oral)   Ht 5\' 5"  (1.651 m)   Wt 175 lb (79.4 kg)   SpO2 98%   BMI 29.12 kg/m   Body mass index is 29.12 kg/m.  BP recheck 160/60 left arm manually with adult cuff Patient is overdue for second dose of hydralazine; will take as soon as she arrives home.  Patient has home BP monitoring device. Patient instructed on most accurate way to take BP, to keep BP log with date and time and how she is feeling and bring log to all future visits.  Tobacco History  Smoking Status  . Never Smoker  Smokeless Tobacco  . Never Used     Counseling given: Yes Patient has never smoked and has no plans to start.   Past Medical History:  Diagnosis Date  . Anemia 08/29/2011  . Anxiety   . Arthritis    "legs" (01/22/2013)  . CAD (coronary artery disease)    s/p CABG in 1999  . Cataract   . Chronic kidney disease   . CLL (chronic lymphoblastic leukemia)   . Diastolic heart failure ([place])   . GERD (gastroesophageal reflux disease)   . H/O hiatal hernia   . Heart murmur   . Hyperlipidemia   . Hypertension   . IDDM (insulin dependent diabetes mellitus) ([place])   . PAD (peripheral artery disease) ([place])   . Paroxysmal atrial fibrillation (HCC)    a. identified on ILR as part of StrokeAF study  . Peripheral vascular disease ([place])   . Stroke (cerebrum) [facility])    Past Surgical History:  Procedure Laterality Date  . ANGIOPLASTY / STENTING FEMORAL Right 09/2010   SFA/notes 11/25/2010 (01/22/2013)  . ANGIOPLASTY / STENTING FEMORAL Left 11/2010   SFA/notes 11/25/2010  (01/22/2013)  . ANGIOPLASTY / STENTING ILIAC     [name] 11/25/2010 (01/22/2013)  . CATARACT EXTRACTION W/ INTRAOCULAR LENS  IMPLANT, BILATERAL Bilateral ?2011  . CHOLECYSTECTOMY  1993  . CORONARY ARTERY BYPASS GRAFT  10/1994   "CABG X3"  . EP IMPLANTABLE DEVICE N/A 09/11/2015   Procedure: Loop Recorder Insertion;  Surgeon: Thompson Grayer, MD;  Location: [facility];  Service: Cardiovascular;  Laterality: N/A;  . HEEL SPUR EXCISION Bilateral 1970's  . LOWER EXTREMITY ANGIOGRAM N/A 01/22/2013   Procedure: LOWER EXTREMITY ANGIOGRAM;  Surgeon: [name], MD;  Location: [facility] CATH LAB;  Service: Cardiovascular;  Laterality: N/A;  . SHOULDER OPEN ROTATOR CUFF REPAIR Bilateral 1990's   "2 times on 1 side; once on the other"  . TRANSLUMINAL ATHERECTOMY FEMORAL ARTERY Right 01/22/2013   & balloon  . TUBAL LIGATION  1980's   Family History  Problem Relation Age of Onset  . Heart disease Mother   . Heart attack Mother   . Hypertension Mother   . Kidney disease Mother   . Pneumonia Father   . Heart attack Father   . Hyperlipidemia Father   . Diabetes Sister   . Breast cancer Sister   . Arthritis Brother   . Diabetes Brother   . Heart attack Sister   . Obesity Sister   .  Hypertension Sister   . Heart attack Sister   . Cancer Sister        breast  . Hyperlipidemia Sister   . Hypertension Sister   . Hyperlipidemia Sister   . Breast cancer Maternal Grandmother    History  Sexual Activity  . Sexual activity: Not Currently    Outpatient Encounter Prescriptions as of 08/16/2016  Medication Sig  . allopurinol (ZYLOPRIM) 100 MG tablet Take 1 tablet (100 mg total) by mouth daily.  Marland Kitchen amLODipine (NORVASC) 10 MG tablet TAKE 1 TABLET(10 MG) BY MOUTH DAILY  . apixaban (ELIQUIS) 5 MG TABS tablet Take 1 tablet (5 mg total) by mouth 2 (two) times daily.  . Ascorbic Acid (VITAMIN C) 100 MG tablet Take 100 mg by mouth daily.  Marland Kitchen atorvastatin (LIPITOR) 80 MG tablet TAKE 1 TABLET BY MOUTH  DAILY  . carboxymethylcellulose (REFRESH PLUS) 0.5 % SOLN 1 drop 3 (three) times daily as needed.  . fenofibrate (TRICOR) 48 MG tablet Take 1 tablet (48 mg total) by mouth daily.  . ferrous sulfate 325 (65 FE) MG tablet Take 1 tablet (325 mg total) by mouth daily with breakfast.  . glucose blood (ACCU-CHEK AVIVA PLUS) test strip TEST three times a day  . GNP GARLIC EXTRACT PO Take 1 tablet by mouth daily.   Marland Kitchen HUMALOG KWIKPEN 100 UNIT/ML KiwkPen ADMINISTER 5 UNITS UNDER THE SKIN THREE TIMES DAILY BEFORE MEALS  . hydrALAZINE (APRESOLINE) 50 MG tablet Take 1 tablet (50 mg total) by mouth 3 (three) times daily.  . Insulin Glargine (LANTUS SOLOSTAR) 100 UNIT/ML Solostar Pen Inject 37 Units into the skin daily at 10 pm.  . Insulin Pen Needle (B-D UF III MINI PEN NEEDLES) 31G X 5 MM MISC as directed once daily.  Uses four needles per day  . Melatonin 10 MG CAPS Take 10 mg by mouth at bedtime as needed (sleep).   . metoprolol tartrate (LOPRESSOR) 25 MG tablet TAKE 1/2 TABLET BY MOUTH TWICE DAILY  . Multiple Vitamins-Minerals (MULTIVITAMINS THER. W/MINERALS) TABS Take 1 tablet by mouth daily.    Marland Kitchen omega-3 acid ethyl esters (LOVAZA) 1 g capsule Take 2 g by mouth 2 (two) times daily.   . pantoprazole (PROTONIX) 40 MG tablet TAKE 1 TABLET BY MOUTH DAILY  . PARoxetine (PAXIL) 20 MG tablet TAKE 1 TABLET BY MOUTH EVERY MORNING  . torsemide (DEMADEX) 100 MG tablet Take 100 mg by mouth daily.  . traMADol (ULTRAM) 50 MG tablet TAKE 1 TABLET BY MOUTH FOUR TIMES DAILY   No facility-administered encounter medications on file as of 08/16/2016.     Activities of Daily Living In your present state of health, do you have any difficulty performing the following activities: 08/16/2016 11/25/2015  Hearing? N N  Vision? N N  Difficulty concentrating or making decisions? N N  Walking or climbing stairs? Y Y  Dressing or bathing? N N  Doing errands, shopping? N N  Preparing Food and eating ? N N  Using the Toilet? N N    In the past six months, have you accidently leaked urine? Y Y  Do you have problems with loss of bowel control? N N  Managing your Medications? N N  Managing your Finances? N N  Housekeeping or managing your Housekeeping? N N  Some recent data might be hidden   Home Safety:  My home has a working smoke alarm:  Yes X 2           My home throw rugs  have been fastened down to the floor or removed:  Removed I have a non-slip surface or non-slip mats in the bathtub and shower:  Yes plus grab bars and shower chair        All my home's stairs have handrails, including any outdoor stairs  One level home with 2 outside steps with handrails          My home's floors, stairs and hallways are free from clutter, wires and cords:  Yes     I have animals in my home  No I wear seatbelts consistently:  Yes    Patient Care Team: [name], MD as PCP - Lillia Pauls, MD (Ophthalmology) [name], MD as Consulting Physician (Cardiology) Zadie Rhine Clent Demark, MD (Ophthalmology) Heath Lark, MD as Consulting Physician (Hematology and Oncology) Estanislado Emms, MD as Consulting Physician (Nephrology) Laurence Spates, MD as Consulting Physician (Gastroenterology)    Assessment:     Exercise Activities and Dietary recommendations Current Exercise Habits: Home exercise routine, Time (Minutes): 15, Frequency (Times/Week): 3, Weekly Exercise (Minutes/Week): 45, Intensity: Mild, Exercise limited by: orthopedic condition(s);neurologic condition(s)  Goals    . Blood Pressure < 150/90    . Exercise 5x per week (30 min per time) (pt-stated)          Cardiologist recommended this also      Fall Risk Fall Risk  08/16/2016 05/16/2016 12/12/2015 12/02/2015 11/25/2015  Falls in the past year? Yes Yes No No No  Number falls in past yr: 1 1 - - -  Injury with Fall? Yes Yes - - -  Risk Factor Category  High Fall Risk - - - -  Risk for fall due to : Impaired balance/gait;Impaired mobility  Other (Comment) - - Impaired balance/gait;Impaired mobility  Risk for fall due to (comments): - stepped off edge of sidewalk  - - -  Follow up Falls evaluation completed;Education provided;Falls prevention discussed - - - -   TUG Test:  Done in 9 seconds. Patient used both hands to push out of chair and to sit back down. Falls prevention discussed in detail and literature given. PCP made aware of High Fall Risk status.  Cognitive Function: Mini-Cog  Passed with score 4/5  Depression Screen PHQ 2/9 Scores 08/16/2016 07/06/2016 12/12/2015 12/02/2015  PHQ - 2 Score 2 0 0 0  PHQ- 9 Score 5 - - -  Behavioral Health contact info given  Cognitive Function MMSE - Mini Mental State Exam 03/29/2011  Orientation to time 5  Orientation to Place 5  Registration 3  Attention/ Calculation 4  Recall 2  Language- name 2 objects 2  Language- repeat 1  Language- follow 3 step command 3  Language- read & follow direction 1  Write a sentence 1  Copy design 1  Total score 28        Immunization History  Administered Date(s) Administered  . Influenza Split 11/10/2010, 10/19/2011  . Influenza Whole 10/13/2006, 10/30/2007, 10/31/2007, 11/17/2008, 10/21/2009  . Influenza,inj,Quad PF,36+ Mos 10/30/2012, 11/01/2013, 10/23/2014, 09/30/2015  . Pneumococcal Conjugate-13 07/18/2014  . Pneumococcal Polysaccharide-23 10/24/2000, 09/21/2011  . Td 11/24/1997, 12/21/2005  . Tdap 03/30/2016  . Zoster 11/16/2006   Screening Tests Health Maintenance  Topic Date Due  . INFLUENZA VACCINE  08/24/2016  . HEMOGLOBIN A1C  01/05/2017  . OPHTHALMOLOGY EXAM  07/01/2017  . FOOT EXAM  07/06/2017  . MAMMOGRAM  04/12/2018  . COLONOSCOPY  07/20/2022  . TETANUS/TDAP  03/31/2026  . DEXA SCAN  Completed  .  Hepatitis C Screening  Completed  . PNA vac Low Risk Adult  Completed   Patient received 1st dose of Shingrix at local pharmacy Discussed receiving flu vaccine yearly starting 9/1.       Plan:     Patient  will take second dose of hydralazine as soon as she arrives home.  Requesting refill on humalog pens prior to Thursday as she is leaving town. States that she needs additional pen ordered as she consistently runs out. Refill request sent to PCP.  I have personally reviewed and noted the following in the patient's chart:   . Medical and social history . Use of alcohol, tobacco or illicit drugs  . Current medications and supplements . Functional ability and status . Nutritional status . Physical activity . Advanced directives . List of other physicians . Hospitalizations, surgeries, and ER visits in previous 12 months . Vitals . Screenings to include cognitive, depression, and falls . Referrals and appointments  In addition, I have reviewed and discussed with patient certain preventive protocols, quality metrics, and best practice recommendations. A written personalized care plan for preventive services as well as general preventive health recommendations were provided to patient.     Velora Heckler, RN  08/16/2016

## 2016-08-16 NOTE — Progress Notes (Signed)
   Subjective:    Patient ID: Christine Santos, female    DOB: 1945/06/30, 71 y.o.   MRN: [id number]  HPI this patient presents the office today requesting a new pair of diabetic shoes.  This patient is diagnosed with diabetes with neuropathy.  She has also been diagnosed with peripheral vascular disease.  Patient has received diabetic shoes yearly for the past few years.  She presents the office today requesting a new pair of diabetic shoes for 2018    Review of Systems  Cardiovascular:       Calf pain with walking  Musculoskeletal: Positive for gait problem.  Skin: Positive for color change.       Change in nails       Objective:   Physical Exam GENERAL APPEARANCE: Alert, conversant. Appropriately groomed. No acute distress.  VASCULAR: Pedal pulses are  palpable at  DP bilateral.  PT pulses are non palpable.  Capillary refill time is immediate to all digits,  Normal temperature gradient.   NEUROLOGIC: sensation is diminished  to 5.07 monofilament at 5/5 sites bilateral.  Light touch is intact bilateral, Muscle strength normal.  MUSCULOSKELETAL: acceptable muscle strength, tone and stability bilateral.  HAV  B/L.  Hammer toe second  B/L NAIL  Thick disfigured discolored nails second toe right foot. DERMATOLOGIC: skin color, texture, and turgor are within normal limits.  No preulcerative lesions or ulcers  are seen, no interdigital maceration noted.  No open lesions present. . No drainage noted.         Assessment & Plan:  Diabetes with neuropathy and PVD.   HAV  B/L  Hammer toe 2 B/L  IE  Patient will be seen by  Liliane Channel for diabetic shoes.  Patient was told to return to the office in one week for her yearly diabetic foot check up.   Gardiner Barefoot DPM

## 2016-08-16 NOTE — Telephone Encounter (Signed)
Patient states she is receiving 5 Humalog Qwikpens every 90 days. Using 5 units TID and priming with 2 units prior to each administration as instructed. States she consistently runs out before the 90 days is up and isn't able to get refill sooner. Requesting 6 pens every 90 days. Spoke with pharmacist at [facility] [phone] who states need to include priming instructions in sig.for insurance to cover 6th pen. Note routed to PCP. Hubbard Hartshorn, RN, BSN

## 2016-08-16 NOTE — Progress Notes (Signed)
Patient ID: Christine Santos, female   DOB: Nov 23, 1945, 71 y.o.   MRN: [id number] I have reviewed this visit and discussed with Howell Rucks, RN, BSN, and agree with her documentation.

## 2016-08-22 ENCOUNTER — Other Ambulatory Visit: Payer: Self-pay | Admitting: Family Medicine

## 2016-08-22 DIAGNOSIS — I10 Essential (primary) hypertension: Secondary | ICD-10-CM

## 2016-08-24 ENCOUNTER — Other Ambulatory Visit: Payer: Self-pay | Admitting: Family Medicine

## 2016-08-24 DIAGNOSIS — M1000 Idiopathic gout, unspecified site: Secondary | ICD-10-CM

## 2016-08-26 ENCOUNTER — Other Ambulatory Visit: Payer: Self-pay | Admitting: Interventional Cardiology

## 2016-08-26 NOTE — Progress Notes (Signed)
STROKE-AF month 9 transmission completed. Next research required visit is due no later than 10/OCT/2018.

## 2016-09-05 ENCOUNTER — Ambulatory Visit (INDEPENDENT_AMBULATORY_CARE_PROVIDER_SITE_OTHER): Payer: Medicare Other | Admitting: *Deleted

## 2016-09-05 DIAGNOSIS — I480 Paroxysmal atrial fibrillation: Secondary | ICD-10-CM

## 2016-09-06 ENCOUNTER — Other Ambulatory Visit (HOSPITAL_BASED_OUTPATIENT_CLINIC_OR_DEPARTMENT_OTHER): Payer: Medicare Other

## 2016-09-06 ENCOUNTER — Ambulatory Visit (HOSPITAL_BASED_OUTPATIENT_CLINIC_OR_DEPARTMENT_OTHER): Payer: Medicare Other | Admitting: Hematology and Oncology

## 2016-09-06 ENCOUNTER — Encounter: Payer: Self-pay | Admitting: Hematology and Oncology

## 2016-09-06 ENCOUNTER — Telehealth: Payer: Self-pay | Admitting: Hematology and Oncology

## 2016-09-06 DIAGNOSIS — N184 Chronic kidney disease, stage 4 (severe): Secondary | ICD-10-CM

## 2016-09-06 DIAGNOSIS — D631 Anemia in chronic kidney disease: Secondary | ICD-10-CM | POA: Diagnosis not present

## 2016-09-06 DIAGNOSIS — C9190 Lymphoid leukemia, unspecified not having achieved remission: Secondary | ICD-10-CM | POA: Diagnosis not present

## 2016-09-06 DIAGNOSIS — C9110 Chronic lymphocytic leukemia of B-cell type not having achieved remission: Secondary | ICD-10-CM

## 2016-09-06 DIAGNOSIS — I10 Essential (primary) hypertension: Secondary | ICD-10-CM

## 2016-09-06 LAB — COMPREHENSIVE METABOLIC PANEL
ALT: 23 U/L (ref 0–55)
ANION GAP: 11 meq/L (ref 3–11)
AST: 29 U/L (ref 5–34)
Albumin: 3 g/dL — ABNORMAL LOW (ref 3.5–5.0)
Alkaline Phosphatase: 99 U/L (ref 40–150)
BUN: 60.4 mg/dL — ABNORMAL HIGH (ref 7.0–26.0)
CALCIUM: 9.4 mg/dL (ref 8.4–10.4)
CO2: 28 mEq/L (ref 22–29)
CREATININE: 3.4 mg/dL — AB (ref 0.6–1.1)
Chloride: 104 mEq/L (ref 98–109)
EGFR: 13 mL/min/{1.73_m2} — AB (ref >90)
Glucose: 70 mg/dl (ref 70–140)
POTASSIUM: 4 meq/L (ref 3.5–5.1)
Sodium: 143 mEq/L (ref 136–145)
Total Bilirubin: 0.32 mg/dL (ref 0.20–1.20)
Total Protein: 5.6 g/dL — ABNORMAL LOW (ref 6.4–8.3)

## 2016-09-06 LAB — CBC & DIFF AND RETIC
BASO%: 0.4 % (ref 0.0–2.0)
BASOS ABS: 0 10*3/uL (ref 0.0–0.1)
EOS%: 2.5 % (ref 0.0–7.0)
Eosinophils Absolute: 0.2 10*3/uL (ref 0.0–0.5)
HEMATOCRIT: 33.3 % — AB (ref 34.8–46.6)
HGB: 10.6 g/dL — ABNORMAL LOW (ref 11.6–15.9)
Immature Retic Fract: 3.1 % (ref 1.60–10.00)
LYMPH#: 2 10*3/uL (ref 0.9–3.3)
LYMPH%: 26.7 % (ref 14.0–49.7)
MCH: 32.3 pg (ref 25.1–34.0)
MCHC: 31.8 g/dL (ref 31.5–36.0)
MCV: 101.5 fL — AB (ref 79.5–101.0)
MONO#: 0.6 10*3/uL (ref 0.1–0.9)
MONO%: 7.6 % (ref 0.0–14.0)
NEUT#: 4.7 10*3/uL (ref 1.5–6.5)
NEUT%: 62.8 % (ref 38.4–76.8)
PLATELETS: 264 10*3/uL (ref 145–400)
RBC: 3.28 10*6/uL — AB (ref 3.70–5.45)
RDW: 14.8 % — ABNORMAL HIGH (ref 11.2–14.5)
Retic %: 1.53 % (ref 0.70–2.10)
Retic Ct Abs: 50.18 10*3/uL (ref 33.70–90.70)
WBC: 7.5 10*3/uL (ref 3.9–10.3)

## 2016-09-06 NOTE — Assessment & Plan Note (Signed)
she will continue current medical management. I recommend close follow-up with primary care doctor for medication adjustment.  

## 2016-09-06 NOTE — Assessment & Plan Note (Signed)
The patient has chronic renal failure She will continue risk factor management and close follow-up with her nephrologist.

## 2016-09-06 NOTE — Assessment & Plan Note (Signed)
She has no signs of disease recurrence I will see her back in 12 months with history, physical examination and blood work.    

## 2016-09-06 NOTE — Assessment & Plan Note (Signed)
This is likely anemia of chronic disease. The patient denies recent history of bleeding such as epistaxis, hematuria or hematochezia. She is asymptomatic from the anemia. We will observe for now.  

## 2016-09-06 NOTE — Telephone Encounter (Signed)
Gave patient avs and calendar for upcoming appts.  °

## 2016-09-06 NOTE — Progress Notes (Signed)
[facility] OFFICE PROGRESS NOTE  Patient Care Team: [name], MD as PCP - Lillia Pauls, MD (Ophthalmology) [name], MD as Consulting Physician (Cardiology) Zadie Rhine Clent Demark, MD (Ophthalmology) Heath Lark, MD as Consulting Physician (Hematology and Oncology) Estanislado Emms, MD as Consulting Physician (Nephrology) Laurence Spates, MD as Consulting Physician (Gastroenterology)  SUMMARY OF ONCOLOGIC HISTORY:   Chronic lymphocytic leukemia ([place])   10/13/2006 Initial Diagnosis    Chronic lymphocytic leukemia      12/24/2013 Procedure    Kidney biopsy showed renal involvement of CLL, with associated diabetic gromerulosclerosis, moderate to severe arterionephrosclerosis      01/10/2014 Imaging    PET/CT scan showed no evidence of metastatic disease      01/10/2014 Procedure    She has placement of port      01/14/2014 Pathology Results    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 She returns with family members and friends She feels well She had recent fall, unrelated to her disease Denies significant injury No recent infection No new lymphadenopathy  REVIEW OF SYSTEMS:   Constitutional: Denies fevers, chills or abnormal weight loss Eyes: Denies blurriness of vision Ears, nose, mouth, throat, and face: Denies mucositis or sore throat Respiratory: Denies cough, dyspnea or wheezes Cardiovascular: Denies palpitation, chest discomfort or lower extremity swelling Gastrointestinal:  Denies nausea, heartburn or change in bowel habits Skin: Denies abnormal skin rashes Lymphatics: Denies new lymphadenopathy or easy bruising Neurological:Denies numbness, tingling or new  weaknesses Behavioral/Psych: Mood is stable, no new changes  All other systems were reviewed with the patient and are negative.  I have reviewed the past medical history, past surgical history, social history and family history with the patient and they are unchanged from previous note.  ALLERGIES:  is allergic to other; penicillins; amoxicillin; cephalexin; and codeine phosphate.  MEDICATIONS:  Current Outpatient Prescriptions  Medication Sig Dispense Refill  . allopurinol (ZYLOPRIM) 100 MG tablet TAKE 1 TABLET(100 MG) BY MOUTH DAILY 90 tablet 0  . amLODipine (NORVASC) 10 MG tablet TAKE 1 TABLET(10 MG) BY MOUTH DAILY 90 tablet 1  . apixaban (ELIQUIS) 5 MG TABS tablet Take 1 tablet (5 mg total) by mouth 2 (two) times daily. 60 tablet 11  . Ascorbic Acid (VITAMIN C) 100 MG tablet Take 100 mg by mouth daily.    Marland Kitchen atorvastatin (LIPITOR) 80 MG tablet TAKE 1 TABLET BY MOUTH DAILY 90 tablet 3  . carboxymethylcellulose (REFRESH PLUS) 0.5 % SOLN 1 drop 3 (three) times daily as needed.    . fenofibrate (TRICOR) 48 MG tablet Take 1 tablet (48 mg total) by mouth daily. 90 tablet 3  . ferrous sulfate 325 (65 FE) MG tablet Take 1 tablet (325 mg total) by mouth daily with breakfast.  3  . glucose blood (ACCU-CHEK AVIVA PLUS) test strip TEST three times a day 300 each 3  . GNP GARLIC EXTRACT PO Take 1 tablet by mouth daily.     . hydrALAZINE (APRESOLINE) 50 MG tablet Take 1 tablet (50 mg total) by mouth 3 (three) times daily. 90 tablet 11  . Insulin Glargine (LANTUS SOLOSTAR) 100 UNIT/ML Solostar Pen Inject 37 Units into the skin daily at  10 pm.    . insulin lispro (HUMALOG KWIKPEN) 100 UNIT/ML KiwkPen ADMINISTER 5 UNITS UNDER THE SKIN THREE TIMES DAILY BEFORE MEALS Prime pen with 2 units prior to administering each dose 18 mL 3  . Insulin Pen Needle (B-D UF III MINI PEN NEEDLES) 31G X 5 MM MISC as directed once daily.  Uses four needles per day 360 each 3  . Melatonin 10 MG CAPS Take 10 mg by mouth at  bedtime as needed (sleep).     . metoprolol tartrate (LOPRESSOR) 25 MG tablet TAKE 1/2 TABLET BY MOUTH TWICE DAILY 90 tablet 3  . Multiple Vitamins-Minerals (MULTIVITAMINS THER. W/MINERALS) TABS Take 1 tablet by mouth daily.      Marland Kitchen omega-3 acid ethyl esters (LOVAZA) 1 g capsule Take 2 g by mouth 2 (two) times daily.     . pantoprazole (PROTONIX) 40 MG tablet TAKE 1 TABLET BY MOUTH DAILY 90 tablet 3  . PARoxetine (PAXIL) 20 MG tablet TAKE 1 TABLET BY MOUTH EVERY MORNING 90 tablet 3  . torsemide (DEMADEX) 100 MG tablet Take 100 mg by mouth daily.    . traMADol (ULTRAM) 50 MG tablet TAKE 1 TABLET BY MOUTH FOUR TIMES DAILY 360 tablet 1   No current facility-administered medications for this visit.     PHYSICAL EXAMINATION: ECOG PERFORMANCE STATUS: 1 - Symptomatic but completely ambulatory  Vitals:   09/06/16 1256  BP: (!) 145/63  Pulse: 66  Resp: 17  Temp: 98.7 F (37.1 C)  SpO2: 94%   Filed Weights   09/06/16 1256  Weight: 177 lb 1.6 oz (80.3 kg)    GENERAL:alert, no distress and comfortable SKIN: skin color, texture, turgor are normal, no rashes or significant lesions EYES: normal, Conjunctiva are pink and non-injected, sclera clear OROPHARYNX:no exudate, no erythema and lips, buccal mucosa, and tongue normal  NECK: supple, thyroid normal size, non-tender, without nodularity LYMPH:  no palpable lymphadenopathy in the cervical, axillary or inguinal LUNGS: clear to auscultation and percussion with normal breathing effort HEART: regular rate & rhythm and no murmurs and no lower extremity edema ABDOMEN:abdomen soft, non-tender and normal bowel sounds Musculoskeletal:no cyanosis of digits and no clubbing  NEURO: alert & oriented x 3 with fluent speech, no focal motor/sensory deficits  LABORATORY DATA:  I have reviewed the data as listed    Component Value Date/Time   NA 143 09/06/2016 1240   K 4.0 09/06/2016 1240   CL 104 03/30/2016 0938   CL 110 (H) 05/28/2012 1259   CO2 28  09/06/2016 1240   GLUCOSE 70 09/06/2016 1240   GLUCOSE 202 (H) 05/28/2012 1259   BUN 60.4 (H) 09/06/2016 1240   CREATININE 3.4 (HH) 09/06/2016 1240   CALCIUM 9.4 09/06/2016 1240   PROT 5.6 (L) 09/06/2016 1240   ALBUMIN 3.0 (L) 09/06/2016 1240   AST 29 09/06/2016 1240   ALT 23 09/06/2016 1240   ALKPHOS 99 09/06/2016 1240   BILITOT 0.32 09/06/2016 1240   GFRNONAA 14 (L) 11/25/2015 1402   GFRAA 16 (L) 11/25/2015 1402    No results found for: SPEP, UPEP  Lab Results  Component Value Date   WBC 7.5 09/06/2016   NEUTROABS 4.7 09/06/2016   HGB 10.6 (L) 09/06/2016   HCT 33.3 (L) 09/06/2016   MCV 101.5 (H) 09/06/2016   PLT 264 09/06/2016      Chemistry      Component Value Date/Time   NA 143 09/06/2016 1240   K 4.0 09/06/2016 1240   CL 104 03/30/2016  0938   CL 110 (H) 05/28/2012 1259   CO2 28 09/06/2016 1240   BUN 60.4 (H) 09/06/2016 1240   CREATININE 3.4 (HH) 09/06/2016 1240      Component Value Date/Time   CALCIUM 9.4 09/06/2016 1240   ALKPHOS 99 09/06/2016 1240   AST 29 09/06/2016 1240   ALT 23 09/06/2016 1240   BILITOT 0.32 09/06/2016 1240      ASSESSMENT & PLAN:  Chronic lymphocytic leukemia She has no signs of disease recurrence I will see her back in 12 months with history, physical examination and blood work.     Chronic kidney disease, stage IV (severe) (HCC) The patient has chronic renal failure She will continue risk factor management and close follow-up with her nephrologist.  Anemia in chronic kidney disease This is likely anemia of chronic disease. The patient denies recent history of bleeding such as epistaxis, hematuria or hematochezia. She is asymptomatic from the anemia. We will observe for now.   Essential hypertension she will continue current medical management. I recommend close follow-up with primary care doctor for medication adjustment.    No orders of the defined types were placed in this encounter.  All questions were answered.  The patient knows to call the clinic with any problems, questions or concerns. No barriers to learning was detected. I spent 15 minutes counseling the patient face to face. The total time spent in the appointment was 20 minutes and more than 50% was on counseling and review of test results     Heath Lark, MD 09/06/2016 3:18 PM

## 2016-09-10 LAB — CUP PACEART REMOTE DEVICE CHECK
Date Time Interrogation Session: 20180813201101
MDC IDC PG IMPLANT DT: 20170818

## 2016-09-10 NOTE — Progress Notes (Signed)
Carelink summary report received, first download since 09/11/15. Battery status OK. Normal device function. No new symptom episodes, tachy episodes, or brady episodes. 121 pause- available ECGs appear ventricular undersensing, except 3 which are undetermined d/t ECG, possible undersensing. 2 AF 0%, no ECGs available. Monthly summary reports and ROV/PRN

## 2016-09-12 NOTE — Addendum Note (Signed)
Addended by: Mechele Dawley on: 09/12/2016 03:57 PM   Modules accepted: Level of Service

## 2016-09-13 ENCOUNTER — Ambulatory Visit: Payer: Medicare Other | Admitting: Orthotics

## 2016-09-13 ENCOUNTER — Telehealth: Payer: Self-pay | Admitting: Podiatry

## 2016-09-13 NOTE — Telephone Encounter (Signed)
Pt left voicemail checking on diabetic shoes.  I returned call and explained that I just refaxed by hand the paperwork with a note to Dr Lowella Bandy office asking him to please refill this out and fax back to me and I will call pt when I get this information back from them

## 2016-09-19 NOTE — Progress Notes (Signed)
Loop Recorder Summary Report 

## 2016-09-20 NOTE — Telephone Encounter (Signed)
Called patient and told we have completed form and faxed to office x 4.  She asked that we find the last fax, she would stop by, pick up and hand deliver to the [place].    Expressed both my frustration and my complete support that she qualifies for diabetic shoes, which I want her to have.

## 2016-09-20 NOTE — Telephone Encounter (Signed)
Pt would like to talk to dr hensel. She is trying to get her diabetic.  She says [facility] have faxed the authorization several times but have not received the signed paperwork. Please advise

## 2016-09-22 ENCOUNTER — Other Ambulatory Visit: Payer: Self-pay | Admitting: Interventional Cardiology

## 2016-09-22 ENCOUNTER — Encounter: Payer: Self-pay | Admitting: *Deleted

## 2016-09-22 ENCOUNTER — Other Ambulatory Visit: Payer: Self-pay | Admitting: Family Medicine

## 2016-09-22 DIAGNOSIS — Z006 Encounter for examination for normal comparison and control in clinical research program: Secondary | ICD-10-CM

## 2016-09-22 MED ORDER — HYDRALAZINE HCL 50 MG PO TABS: 50.0000 mg | ORAL_TABLET | Freq: Three times a day (TID) | ORAL | 9 refills | Status: DC

## 2016-09-22 NOTE — Telephone Encounter (Signed)
Patient called and stated that she is going out of town for the holiday and needs a refill on her Tramadol to take with her. She called the pharmacy and they told her to call her doctor.Christine Santos

## 2016-09-22 NOTE — Progress Notes (Addendum)
STROKE-AF Research study month 12 follow up visit completed. Patient denies any adverse events. Loop interrogated.

## 2016-10-01 ENCOUNTER — Other Ambulatory Visit: Payer: Self-pay | Admitting: Internal Medicine

## 2016-10-01 DIAGNOSIS — I480 Paroxysmal atrial fibrillation: Secondary | ICD-10-CM

## 2016-10-05 ENCOUNTER — Ambulatory Visit (INDEPENDENT_AMBULATORY_CARE_PROVIDER_SITE_OTHER): Payer: Medicare Other | Admitting: Orthotics

## 2016-10-05 ENCOUNTER — Ambulatory Visit: Payer: Medicare Other | Admitting: *Deleted

## 2016-10-05 DIAGNOSIS — E1142 Type 2 diabetes mellitus with diabetic polyneuropathy: Secondary | ICD-10-CM | POA: Diagnosis not present

## 2016-10-05 DIAGNOSIS — M2041 Other hammer toe(s) (acquired), right foot: Secondary | ICD-10-CM

## 2016-10-05 DIAGNOSIS — M2012 Hallux valgus (acquired), left foot: Secondary | ICD-10-CM | POA: Diagnosis not present

## 2016-10-05 DIAGNOSIS — M2011 Hallux valgus (acquired), right foot: Secondary | ICD-10-CM

## 2016-10-06 NOTE — Progress Notes (Signed)
[facility] Summary Report / Loop Recorder 

## 2016-10-07 NOTE — Progress Notes (Signed)
Patient presents for diabetic shoe pick up, shoes are tried on for good fit.  Patient received 1 Pair and 3 pairs custom molded diabetic inserts.  Verbal and written break in and wear instructions given.  Patient will follow up for scheduled routine care.   

## 2016-10-10 DIAGNOSIS — E118 Type 2 diabetes mellitus with unspecified complications: Secondary | ICD-10-CM | POA: Diagnosis not present

## 2016-10-23 ENCOUNTER — Other Ambulatory Visit: Payer: Self-pay | Admitting: Family Medicine

## 2016-10-24 ENCOUNTER — Other Ambulatory Visit: Payer: Self-pay | Admitting: Nurse Practitioner

## 2016-11-02 ENCOUNTER — Telehealth: Payer: Self-pay | Admitting: Cardiology

## 2016-11-02 NOTE — Telephone Encounter (Signed)
Spoke w/ pt and requested that her send a manual transmission b/c she home monitor has not updated in at least 14 days.

## 2016-11-14 NOTE — Progress Notes (Deleted)
[facility]  PATIENT: Christine Santos DOB: 10-07-1945   REASON FOR VISIT: Follow-up for lacunar infarct, atrial fibrillation HISTORY FROM: Patient and sister Christine Santos    HISTORY OF PRESENT ILLNESS:UPDATE 04/23/2018CM  Christine Santos, 71 year old female returns for follow-up after admission in August 2017 for stroke. 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 MRA of the brain showed no significant large vessel stenosis or occlusion. Carotid Doppler no significant extracranial stenosis. She is currently on eliquis for secondary stroke prevention and atrial fibrillation.  She has no bruising or bleeding. She remains on Lipitor for hyperlipidemia without complaints of myalgias. CBGs this morning 84. Most recent hemoglobin A1c 6. She [place] out walking and tripped and broke her right elbow. Her right arm is in a sling. Blood pressure in the office today 150/68. She returns for reevaluation   HISTORY 11/16/15 [name] is a pleasant 71 year old Caucasian lady seen today for first office follow-up visit for hospital admission for stroke in August 2017. She is a complaint today by her sister.Christine Santos an 83 y.o.femalewith history of CAD (now on ASA, plavix), CLL, CHF, HTN, HLD, IDDM, GERD who presents to the ED for evaluation of blurred vision/double vision and dizziness. She states yesterday at 1000 she noted she felt dizzy but not vertigo. She thought it was her blood sugar and took a glucose pill. This persisted and at 1600 she noted horizontal diplopia and felt off balance due to her vision. Due to not returning to baseline she came to ED today. She had diplopia on upward and horizontal gaze and her husband states her left eye has a new ptosis. BP is 166/93 which was elevated more than normal. She stated she took her antiplatelets every day. In addition she was  bradycardic while in ED with pulse of 42. She was last known  well 09/03/2015 at 10 AM. Patient was not administered IV t-PA secondary to being outside of the window. Shewas admitted for further evaluation and treatment. MRI scan of the brain showed left midbrain lacunar infarct due to small vessel disease. MRA of the brain showed no significant large vessel stenosis or occlusion. Carotid ultrasound showed no significant extracranial stenosis transversely echo showed normal ejection fraction. LDL cholesterol is mildly elevated 99 mg percent. Hemoglobin A1c in June 2017 was 6.8. Patient was enrolled into the stroke atrial fibrillation trial and was randomized into the loop recorder insertion. She was found to have paroxysmal atrial fibrillation in September 2017 and change from antiplatelet agent to eliquis. She is tolerating it well without bleeding or bruising. She states her blood pressure is usually well controlled but today it is elevated at 160/80. Her fasting sugars are usually in the 90-100 range. She has no new complaints today. She does have lower extremity swelling and I recommend she discuss with her primary physician switching Norvasc to alternative blood pressure agent.    REVIEW OF SYSTEMS: Full 14 system review of systems performed and notable only for those listed, all others are neg:  Constitutional: neg  Cardiovascular: neg Ear/Nose/Throat: neg  Skin: neg Eyes: neg Respiratory: neg Gastroitestinal: neg  Hematology/Lymphatic: neg  Endocrine: neg Musculoskeletal elbow pain on the right recent fracture Allergy/Immunology: neg Neurological: neg Psychiatric: neg Sleep : neg   ALLERGIES: Allergies  Allergen Reactions  . Other     Darvocet  . Penicillins   . Amoxicillin Rash    Has patient had a PCN reaction  causing immediate rash, facial/tongue/throat swelling, SOB or lightheadedness with hypotension: No Has patient had a PCN reaction causing severe rash involving mucus membranes or skin necrosis: No Has patient had a PCN reaction  that required hospitalization: No Has patient had a PCN reaction occurring within the last 10 years: No If all of the above answers are "NO", then may proceed with Cephalosporin use.   . Cephalexin Rash  . Codeine Phosphate Rash    HOME MEDICATIONS: Outpatient Medications Prior to Visit  Medication Sig Dispense Refill  . allopurinol (ZYLOPRIM) 100 MG tablet TAKE 1 TABLET(100 MG) BY MOUTH DAILY 90 tablet 0  . amLODipine (NORVASC) 10 MG tablet TAKE 1 TABLET(10 MG) BY MOUTH DAILY 90 tablet 1  . Ascorbic Acid (VITAMIN C) 100 MG tablet Take 100 mg by mouth daily.    Marland Kitchen atorvastatin (LIPITOR) 80 MG tablet TAKE 1 TABLET BY MOUTH DAILY 90 tablet 3  . carboxymethylcellulose (REFRESH PLUS) 0.5 % SOLN 1 drop 3 (three) times daily as needed.    Marland Kitchen ELIQUIS 5 MG TABS tablet TAKE 1 TABLET(5 MG) BY MOUTH TWICE DAILY 60 tablet 11  . fenofibrate (TRICOR) 48 MG tablet Take 1 tablet (48 mg total) by mouth daily. 90 tablet 3  . ferrous sulfate 325 (65 FE) MG tablet Take 1 tablet (325 mg total) by mouth daily with breakfast.  3  . glucose blood (ACCU-CHEK AVIVA PLUS) test strip TEST three times a day 300 each 3  . GNP GARLIC EXTRACT PO Take 1 tablet by mouth daily.     . hydrALAZINE (APRESOLINE) 50 MG tablet Take 1 tablet (50 mg total) by mouth 3 (three) times daily. 90 tablet 9  . Insulin Glargine (LANTUS SOLOSTAR) 100 UNIT/ML Solostar Pen Inject 37 Units into the skin daily at 10 pm.    . insulin lispro (HUMALOG KWIKPEN) 100 UNIT/ML KiwkPen ADMINISTER 5 UNITS UNDER THE SKIN THREE TIMES DAILY BEFORE MEALS Prime pen with 2 units prior to administering each dose 18 mL 3  . Insulin Pen Needle (B-D UF III MINI PEN NEEDLES) 31G X 5 MM MISC as directed once daily.  Uses four needles per day 360 each 3  . Melatonin 10 MG CAPS Take 10 mg by mouth at bedtime as needed (sleep).     . metoprolol tartrate (LOPRESSOR) 25 MG tablet TAKE 1/2 TABLET BY MOUTH TWICE DAILY 90 tablet 3  . Multiple Vitamins-Minerals (MULTIVITAMINS  THER. W/MINERALS) TABS Take 1 tablet by mouth daily.      Marland Kitchen omega-3 acid ethyl esters (LOVAZA) 1 g capsule Take 2 g by mouth 2 (two) times daily.     . pantoprazole (PROTONIX) 40 MG tablet TAKE 1 TABLET BY MOUTH DAILY 90 tablet 3  . PARoxetine (PAXIL) 20 MG tablet TAKE 1 TABLET BY MOUTH EVERY MORNING 90 tablet 3  . torsemide (DEMADEX) 100 MG tablet Take 100 mg by mouth daily.    . traMADol (ULTRAM) 50 MG tablet TAKE 1 TABLET BY MOUTH FOUR TIMES DAILY 360 tablet 1   No facility-administered medications prior to visit.     PAST MEDICAL HISTORY: Past Medical History:  Diagnosis Date  . Anemia 08/29/2011  . Anxiety   . Arthritis    "legs" (01/22/2013)  . CAD (coronary artery disease)    s/p CABG in 1999  . Cataract   . Chronic kidney disease   . CLL (chronic lymphoblastic leukemia)   . Diastolic heart failure ([place])   . GERD (gastroesophageal reflux disease)   . H/O  hiatal hernia   . Heart murmur   . Hyperlipidemia   . Hypertension   . IDDM (insulin dependent diabetes mellitus) ([place])   . PAD (peripheral artery disease) ([place])   . Paroxysmal atrial fibrillation (H[facility])    a. identified on ILR as part of StrokeAF study  . Peripheral vascular disease ([place])   . Stroke (cerebrum) ([place])     PAST SURGICAL HISTORY: Past Surgical History:  Procedure Laterality Date  . ANGIOPLASTY / STENTING FEMORAL Right 09/2010   SFA/notes 11/25/2010 (01/22/2013)  . ANGIOPLASTY / STENTING FEMORAL Left 11/2010   SFA/notes 11/25/2010 (01/22/2013)  . ANGIOPLASTY / STENTING ILIAC     [name] 11/25/2010 (01/22/2013)  . CATARACT EXTRACTION W/ INTRAOCULAR LENS  IMPLANT, BILATERAL Bilateral ?2011  . CHOLECYSTECTOMY  1993  . CORONARY ARTERY BYPASS GRAFT  10/1994   "CABG X3"  . EP IMPLANTABLE DEVICE N/A 09/11/2015   Procedure: Loop Recorder Insertion;  Surgeon: Thompson Grayer, MD;  Location: [facility];  Service: Cardiovascular;  Laterality: N/A;  . HEEL SPUR EXCISION Bilateral 1970's  . LOWER EXTREMITY  ANGIOGRAM N/A 01/22/2013   Procedure: LOWER EXTREMITY ANGIOGRAM;  Surgeon: [name], MD;  Location: [facility] CATH LAB;  Service: Cardiovascular;  Laterality: N/A;  . SHOULDER OPEN ROTATOR CUFF REPAIR Bilateral 1990's   "2 times on 1 side; once on the other"  . TRANSLUMINAL ATHERECTOMY FEMORAL ARTERY Right 01/22/2013   & balloon  . TUBAL LIGATION  1980's    FAMILY HISTORY: Family History  Problem Relation Age of Onset  . Heart disease Mother   . Heart attack Mother   . Hypertension Mother   . Kidney disease Mother   . Pneumonia Father   . Heart attack Father   . Hyperlipidemia Father   . Diabetes Sister   . Breast cancer Sister   . Arthritis Brother   . Diabetes Brother   . Heart attack Sister   . Obesity Sister   . Hypertension Sister   . Heart attack Sister   . Cancer Sister        breast  . Hyperlipidemia Sister   . Hypertension Sister   . Hyperlipidemia Sister   . Breast cancer Maternal Grandmother     SOCIAL HISTORY: Social History   Social History  . Marital status: Divorced    Spouse name: N/A  . Number of children: 2  . Years of education: 12   Occupational History  . DISABILITY Other    Worked at [employer] Topics  . Smoking status: Never Smoker  . Smokeless tobacco: Never Used  . Alcohol use No  . Drug use: No  . Sexual activity: Not Currently   Other Topics Concern  . Not on file   Social History Narrative   Health Care POA:    Emergency Contact: sister, [name] (h) [phone]   End of Life Plan:    Who lives with you: female friend   Any pets: none   Diet: Pt has a varied diet of protein, starch, vegetables   Exercise: Pt does not have any regular exercise routine.   Seatbelts: Pt reports wearing seatbelt when in vehicles.    Sun Exposure/Protection: Pt reports using sun screen.   Hobbies: going to lake, biking, blue grass music      Current Social History 08/16/2016        Patient lives with [name]  (Significant other) in one level home 08/16/2016   Transportation: Patient has  own vehicle 08/16/2016   Important Relationships [name] and sisters, [name] and Vaughan Basta 08/16/2016    Pets: None 08/16/2016   Education / Work:  12 th grade/ Disabled ([employer]) 08/16/2016   Interests / Fun: Going to [facility], out to eat with friends 08/16/2016   Current Stressors: Bilat foot pain (neuropathy) 08/16/2016   Religious / Personal Beliefs: Raised [employer], goes to [employer] 08/16/2016   L. Ducatte, RN, BSN                                                                                                                PHYSICAL EXAM  There were no vitals filed for this visit. There is no height or weight on file to calculate BMI.  Generalized: Well developed, in no acute distress  Head: normocephalic and atraumatic,. Oropharynx benign  Neck: Supple, no carotid bruits  Cardiac: Regular rate rhythm, no murmur  Musculoskeletal: No deformity   Neurological examination   Mentation: Alert oriented to time, place, history taking. Attention span and concentration appropriate. Recent and remote memory intact.  Follows all commands speech and language fluent.   Cranial nerve II-XII: Pupils were equal round reactive to light extraocular movements were full, visual field were full on confrontational test. Facial sensation and strength were normal. hearing was intact to finger rubbing bilaterally. Uvula tongue midline. head turning and shoulder shrug were normal and symmetric.Tongue protrusion into cheek strength was normal. Motor: normal bulk and tone, full strength in the BUE, BLE,Except right arm recent fracture  Sensory: normal and symmetric to light touch, pinprick, and  Vibration in the upper and lower extremities  Coordination finger-nose-finger, heel-to-shin bilaterally, no dysmetria Reflexes:  1+ upper lower and symmetric, plantar responses were flexor bilaterally. Gait and Station: Rising up  from seated position without assistance, normal stance,  moderate stride, good arm swing, smooth turning, able to perform tiptoe, and heel walking without difficulty. Tandem gait is steady. No assistive device   DIAGNOSTIC DATA (LABS, IMAGING, TESTING) - I reviewed patient records, labs, notes, testing and imaging myself where available.  Lab Results  Component Value Date   WBC 7.5 09/06/2016   HGB 10.6 (L) 09/06/2016   HCT 33.3 (L) 09/06/2016   MCV 101.5 (H) 09/06/2016   PLT 264 09/06/2016      Component Value Date/Time   NA 143 09/06/2016 1240   K 4.0 09/06/2016 1240   CL 104 03/30/2016 0938   CL 110 (H) 05/28/2012 1259   CO2 28 09/06/2016 1240   GLUCOSE 70 09/06/2016 1240   GLUCOSE 202 (H) 05/28/2012 1259   BUN 60.4 (H) 09/06/2016 1240   CREATININE 3.4 (HH) 09/06/2016 1240   CALCIUM 9.4 09/06/2016 1240   PROT 5.6 (L) 09/06/2016 1240   ALBUMIN 3.0 (L) 09/06/2016 1240   AST 29 09/06/2016 1240   ALT 23 09/06/2016 1240   ALKPHOS 99 09/06/2016 1240   BILITOT 0.32 09/06/2016 1240   GFRNONAA 14 (L) 11/25/2015 1402   GFRAA 16 (L) 11/25/2015 1402   Lab Results  Component Value Date   CHOL 169 09/04/2015   HDL 32 (L) 09/04/2015   LDLCALC 99 09/04/2015   TRIG 188 (H) 09/04/2015   CHOLHDL 5.3 09/04/2015   Lab Results  Component Value Date   HGBA1C 6.0 07/06/2016    Lab Results  Component Value Date   TSH 3.397 09/04/2015      ASSESSMENT AND PLAN 45 year Caucasian lady with small left midbrain lacunar infarct in August 2017 due to small vessel disease. She participated in the stroke atrial fibrillation trial and was randomized into the loop recorder insertion arm and was subsequently found to have paroxysmal atrial fibrillation. Multiple vascular risk factors of diabetes, hypertension, hyperlipidemia and paroxysmal   atrial fibrillation  PLAN: Stressed the importance of management of risk factors to prevent further stroke Continue eliquis for secondary stroke  prevention and atrial fibrillation Maintain strict control of hypertension with blood pressure goal below 130/90, today's reading 150/68,  continue antihypertensive medications Control of diabetes with hemoglobin A1c below 6.5 followed by primary care most recent hemoglobin A1c 6.0 continue diabetic medications Cholesterol with LDL cholesterol less than 70, followed by primary care,  Continue Lipitor Exercise by walking, slowly increase , eat healthy diet with whole grains,  fresh fruits and vegetables Follow up in 6 months This was a visit requiring 25 minutes  Of  medical decision making of high complexity with extensive review of history, hospital chart, counseling and answering questions for both the patient and her sister. Given information on stroke prevention and reviewed this with her.  Dennie Bible, [facility], [facility], APRN  [facility] [address] [place], [place] [address] [phone]

## 2016-11-15 ENCOUNTER — Ambulatory Visit: Payer: Medicare Other | Admitting: Nurse Practitioner

## 2016-11-15 ENCOUNTER — Telehealth: Payer: Self-pay | Admitting: Neurology

## 2016-11-15 ENCOUNTER — Telehealth: Payer: Self-pay | Admitting: Family Medicine

## 2016-11-15 NOTE — Telephone Encounter (Signed)
Informed pt and she has an appointment scheduled for 11/17/2016. Katharina Caper, April D, [place]

## 2016-11-15 NOTE — Telephone Encounter (Signed)
Pt has chest congestion that gets worse when she lays down.  Could dr Andria Frames call something for the congestion and the cough?  [facility] on [address]. Please advise

## 2016-11-15 NOTE — Telephone Encounter (Signed)
Called the patient and made her aware that Hoyle Sauer was out of the office today for emergency. Was able to reschedule patient for Jan 20 2017 at 11:00 am. Pt verbalized understanding and had no further questions.

## 2016-11-15 NOTE — Telephone Encounter (Signed)
Recommend robitussin DM which is available OTC.  If she feels she needs more, should be seen.

## 2016-11-16 ENCOUNTER — Telehealth: Payer: Self-pay | Admitting: Cardiology

## 2016-11-16 NOTE — Telephone Encounter (Signed)
Spoke w/ pt and requested that she send a manual transmission b/c her home monitor has not updated in at least 14 days.   

## 2016-11-17 ENCOUNTER — Ambulatory Visit: Payer: Medicare Other | Admitting: Family Medicine

## 2016-11-20 ENCOUNTER — Other Ambulatory Visit: Payer: Self-pay | Admitting: Family Medicine

## 2016-11-20 DIAGNOSIS — M1000 Idiopathic gout, unspecified site: Secondary | ICD-10-CM

## 2016-11-24 ENCOUNTER — Other Ambulatory Visit: Payer: Self-pay | Admitting: Family Medicine

## 2016-11-24 DIAGNOSIS — J189 Pneumonia, unspecified organism: Secondary | ICD-10-CM

## 2016-11-24 DIAGNOSIS — N184 Chronic kidney disease, stage 4 (severe): Secondary | ICD-10-CM | POA: Diagnosis not present

## 2016-11-24 DIAGNOSIS — N2581 Secondary hyperparathyroidism of renal origin: Secondary | ICD-10-CM | POA: Diagnosis not present

## 2016-11-24 DIAGNOSIS — R600 Localized edema: Secondary | ICD-10-CM | POA: Diagnosis not present

## 2016-11-24 DIAGNOSIS — I10 Essential (primary) hypertension: Secondary | ICD-10-CM | POA: Diagnosis not present

## 2016-11-24 HISTORY — DX: Pneumonia, unspecified organism: J18.9

## 2016-11-25 ENCOUNTER — Inpatient Hospital Stay (HOSPITAL_COMMUNITY)
Admission: EM | Admit: 2016-11-25 | Discharge: 2016-12-07 | DRG: 853 | Disposition: A | Discharge status: Home Health | Payer: Medicare Other | Attending: Family Medicine | Admitting: Family Medicine

## 2016-11-25 ENCOUNTER — Encounter (HOSPITAL_COMMUNITY): Payer: Self-pay

## 2016-11-25 ENCOUNTER — Emergency Department (HOSPITAL_COMMUNITY): Payer: Medicare Other

## 2016-11-25 DIAGNOSIS — Z803 Family history of malignant neoplasm of breast: Secondary | ICD-10-CM

## 2016-11-25 DIAGNOSIS — E1149 Type 2 diabetes mellitus with other diabetic neurological complication: Secondary | ICD-10-CM | POA: Diagnosis present

## 2016-11-25 DIAGNOSIS — E1122 Type 2 diabetes mellitus with diabetic chronic kidney disease: Secondary | ICD-10-CM | POA: Diagnosis not present

## 2016-11-25 DIAGNOSIS — I5042 Chronic combined systolic (congestive) and diastolic (congestive) heart failure: Secondary | ICD-10-CM | POA: Diagnosis present

## 2016-11-25 DIAGNOSIS — J180 Bronchopneumonia, unspecified organism: Secondary | ICD-10-CM | POA: Diagnosis not present

## 2016-11-25 DIAGNOSIS — R6521 Severe sepsis with septic shock: Secondary | ICD-10-CM | POA: Diagnosis not present

## 2016-11-25 DIAGNOSIS — A419 Sepsis, unspecified organism: Principal | ICD-10-CM | POA: Diagnosis present

## 2016-11-25 DIAGNOSIS — E1142 Type 2 diabetes mellitus with diabetic polyneuropathy: Secondary | ICD-10-CM | POA: Diagnosis present

## 2016-11-25 DIAGNOSIS — Z8673 Personal history of transient ischemic attack (TIA), and cerebral infarction without residual deficits: Secondary | ICD-10-CM | POA: Diagnosis not present

## 2016-11-25 DIAGNOSIS — J13 Pneumonia due to Streptococcus pneumoniae: Secondary | ICD-10-CM

## 2016-11-25 DIAGNOSIS — Z794 Long term (current) use of insulin: Secondary | ICD-10-CM

## 2016-11-25 DIAGNOSIS — E785 Hyperlipidemia, unspecified: Secondary | ICD-10-CM | POA: Diagnosis present

## 2016-11-25 DIAGNOSIS — J869 Pyothorax without fistula: Secondary | ICD-10-CM | POA: Diagnosis present

## 2016-11-25 DIAGNOSIS — I480 Paroxysmal atrial fibrillation: Secondary | ICD-10-CM | POA: Diagnosis present

## 2016-11-25 DIAGNOSIS — Z9689 Presence of other specified functional implants: Secondary | ICD-10-CM

## 2016-11-25 DIAGNOSIS — Z9842 Cataract extraction status, left eye: Secondary | ICD-10-CM

## 2016-11-25 DIAGNOSIS — Z961 Presence of intraocular lens: Secondary | ICD-10-CM | POA: Diagnosis present

## 2016-11-25 DIAGNOSIS — Z833 Family history of diabetes mellitus: Secondary | ICD-10-CM

## 2016-11-25 DIAGNOSIS — N390 Urinary tract infection, site not specified: Secondary | ICD-10-CM | POA: Diagnosis not present

## 2016-11-25 DIAGNOSIS — R042 Hemoptysis: Secondary | ICD-10-CM | POA: Diagnosis not present

## 2016-11-25 DIAGNOSIS — K219 Gastro-esophageal reflux disease without esophagitis: Secondary | ICD-10-CM | POA: Diagnosis present

## 2016-11-25 DIAGNOSIS — L89159 Pressure ulcer of sacral region, unspecified stage: Secondary | ICD-10-CM | POA: Diagnosis not present

## 2016-11-25 DIAGNOSIS — Z881 Allergy status to other antibiotic agents status: Secondary | ICD-10-CM

## 2016-11-25 DIAGNOSIS — Z9221 Personal history of antineoplastic chemotherapy: Secondary | ICD-10-CM

## 2016-11-25 DIAGNOSIS — Z5332 Thoracoscopic surgical procedure converted to open procedure: Secondary | ICD-10-CM | POA: Diagnosis not present

## 2016-11-25 DIAGNOSIS — N170 Acute kidney failure with tubular necrosis: Secondary | ICD-10-CM | POA: Diagnosis not present

## 2016-11-25 DIAGNOSIS — D62 Acute posthemorrhagic anemia: Secondary | ICD-10-CM | POA: Diagnosis not present

## 2016-11-25 DIAGNOSIS — E11319 Type 2 diabetes mellitus with unspecified diabetic retinopathy without macular edema: Secondary | ICD-10-CM | POA: Diagnosis present

## 2016-11-25 DIAGNOSIS — Z9841 Cataract extraction status, right eye: Secondary | ICD-10-CM

## 2016-11-25 DIAGNOSIS — I129 Hypertensive chronic kidney disease with stage 1 through stage 4 chronic kidney disease, or unspecified chronic kidney disease: Secondary | ICD-10-CM | POA: Diagnosis not present

## 2016-11-25 DIAGNOSIS — R748 Abnormal levels of other serum enzymes: Secondary | ICD-10-CM | POA: Diagnosis not present

## 2016-11-25 DIAGNOSIS — Z880 Allergy status to penicillin: Secondary | ICD-10-CM

## 2016-11-25 DIAGNOSIS — I4892 Unspecified atrial flutter: Secondary | ICD-10-CM | POA: Diagnosis not present

## 2016-11-25 DIAGNOSIS — I132 Hypertensive heart and chronic kidney disease with heart failure and with stage 5 chronic kidney disease, or end stage renal disease: Secondary | ICD-10-CM | POA: Diagnosis not present

## 2016-11-25 DIAGNOSIS — R7989 Other specified abnormal findings of blood chemistry: Secondary | ICD-10-CM | POA: Diagnosis not present

## 2016-11-25 DIAGNOSIS — Z6831 Body mass index (BMI) 31.0-31.9, adult: Secondary | ICD-10-CM

## 2016-11-25 DIAGNOSIS — R059 Cough, unspecified: Secondary | ICD-10-CM

## 2016-11-25 DIAGNOSIS — J189 Pneumonia, unspecified organism: Secondary | ICD-10-CM | POA: Diagnosis not present

## 2016-11-25 DIAGNOSIS — Z79899 Other long term (current) drug therapy: Secondary | ICD-10-CM

## 2016-11-25 DIAGNOSIS — J9601 Acute respiratory failure with hypoxia: Secondary | ICD-10-CM | POA: Diagnosis not present

## 2016-11-25 DIAGNOSIS — Z09 Encounter for follow-up examination after completed treatment for conditions other than malignant neoplasm: Secondary | ICD-10-CM

## 2016-11-25 DIAGNOSIS — M109 Gout, unspecified: Secondary | ICD-10-CM | POA: Diagnosis present

## 2016-11-25 DIAGNOSIS — I509 Heart failure, unspecified: Secondary | ICD-10-CM | POA: Diagnosis not present

## 2016-11-25 DIAGNOSIS — R269 Unspecified abnormalities of gait and mobility: Secondary | ICD-10-CM | POA: Diagnosis not present

## 2016-11-25 DIAGNOSIS — Z885 Allergy status to narcotic agent status: Secondary | ICD-10-CM

## 2016-11-25 DIAGNOSIS — R079 Chest pain, unspecified: Secondary | ICD-10-CM

## 2016-11-25 DIAGNOSIS — Z9889 Other specified postprocedural states: Secondary | ICD-10-CM

## 2016-11-25 DIAGNOSIS — R0602 Shortness of breath: Secondary | ICD-10-CM | POA: Diagnosis not present

## 2016-11-25 DIAGNOSIS — I259 Chronic ischemic heart disease, unspecified: Secondary | ICD-10-CM | POA: Diagnosis not present

## 2016-11-25 DIAGNOSIS — N184 Chronic kidney disease, stage 4 (severe): Secondary | ICD-10-CM | POA: Diagnosis not present

## 2016-11-25 DIAGNOSIS — I2510 Atherosclerotic heart disease of native coronary artery without angina pectoris: Secondary | ICD-10-CM | POA: Diagnosis not present

## 2016-11-25 DIAGNOSIS — A491 Streptococcal infection, unspecified site: Secondary | ICD-10-CM | POA: Diagnosis not present

## 2016-11-25 DIAGNOSIS — R339 Retention of urine, unspecified: Secondary | ICD-10-CM | POA: Diagnosis not present

## 2016-11-25 DIAGNOSIS — N185 Chronic kidney disease, stage 5: Secondary | ICD-10-CM | POA: Diagnosis present

## 2016-11-25 DIAGNOSIS — R778 Other specified abnormalities of plasma proteins: Secondary | ICD-10-CM

## 2016-11-25 DIAGNOSIS — Z7901 Long term (current) use of anticoagulants: Secondary | ICD-10-CM

## 2016-11-25 DIAGNOSIS — E7800 Pure hypercholesterolemia, unspecified: Secondary | ICD-10-CM | POA: Diagnosis present

## 2016-11-25 DIAGNOSIS — L8990 Pressure ulcer of unspecified site, unspecified stage: Secondary | ICD-10-CM

## 2016-11-25 DIAGNOSIS — R05 Cough: Secondary | ICD-10-CM

## 2016-11-25 DIAGNOSIS — Z951 Presence of aortocoronary bypass graft: Secondary | ICD-10-CM

## 2016-11-25 DIAGNOSIS — I482 Chronic atrial fibrillation: Secondary | ICD-10-CM | POA: Diagnosis present

## 2016-11-25 DIAGNOSIS — R011 Cardiac murmur, unspecified: Secondary | ICD-10-CM | POA: Diagnosis present

## 2016-11-25 DIAGNOSIS — J90 Pleural effusion, not elsewhere classified: Secondary | ICD-10-CM

## 2016-11-25 DIAGNOSIS — R0902 Hypoxemia: Secondary | ICD-10-CM | POA: Diagnosis not present

## 2016-11-25 DIAGNOSIS — J181 Lobar pneumonia, unspecified organism: Secondary | ICD-10-CM

## 2016-11-25 DIAGNOSIS — J9690 Respiratory failure, unspecified, unspecified whether with hypoxia or hypercapnia: Secondary | ICD-10-CM

## 2016-11-25 DIAGNOSIS — K589 Irritable bowel syndrome without diarrhea: Secondary | ICD-10-CM | POA: Diagnosis present

## 2016-11-25 DIAGNOSIS — E1121 Type 2 diabetes mellitus with diabetic nephropathy: Secondary | ICD-10-CM | POA: Diagnosis present

## 2016-11-25 DIAGNOSIS — Z8349 Family history of other endocrine, nutritional and metabolic diseases: Secondary | ICD-10-CM

## 2016-11-25 DIAGNOSIS — H3530 Unspecified macular degeneration: Secondary | ICD-10-CM | POA: Diagnosis present

## 2016-11-25 DIAGNOSIS — Z8249 Family history of ischemic heart disease and other diseases of the circulatory system: Secondary | ICD-10-CM

## 2016-11-25 DIAGNOSIS — J153 Pneumonia due to streptococcus, group B: Secondary | ICD-10-CM | POA: Diagnosis not present

## 2016-11-25 DIAGNOSIS — E669 Obesity, unspecified: Secondary | ICD-10-CM | POA: Diagnosis present

## 2016-11-25 DIAGNOSIS — F419 Anxiety disorder, unspecified: Secondary | ICD-10-CM | POA: Diagnosis present

## 2016-11-25 DIAGNOSIS — M1990 Unspecified osteoarthritis, unspecified site: Secondary | ICD-10-CM | POA: Diagnosis present

## 2016-11-25 DIAGNOSIS — D631 Anemia in chronic kidney disease: Secondary | ICD-10-CM | POA: Diagnosis present

## 2016-11-25 DIAGNOSIS — E1151 Type 2 diabetes mellitus with diabetic peripheral angiopathy without gangrene: Secondary | ICD-10-CM | POA: Diagnosis present

## 2016-11-25 DIAGNOSIS — Z8261 Family history of arthritis: Secondary | ICD-10-CM

## 2016-11-25 DIAGNOSIS — Z9049 Acquired absence of other specified parts of digestive tract: Secondary | ICD-10-CM

## 2016-11-25 DIAGNOSIS — Z841 Family history of disorders of kidney and ureter: Secondary | ICD-10-CM

## 2016-11-25 DIAGNOSIS — N179 Acute kidney failure, unspecified: Secondary | ICD-10-CM | POA: Diagnosis not present

## 2016-11-25 DIAGNOSIS — Z23 Encounter for immunization: Secondary | ICD-10-CM

## 2016-11-25 DIAGNOSIS — C9110 Chronic lymphocytic leukemia of B-cell type not having achieved remission: Secondary | ICD-10-CM | POA: Diagnosis present

## 2016-11-25 DIAGNOSIS — R091 Pleurisy: Secondary | ICD-10-CM | POA: Diagnosis not present

## 2016-11-25 DIAGNOSIS — K210 Gastro-esophageal reflux disease with esophagitis: Secondary | ICD-10-CM | POA: Diagnosis present

## 2016-11-25 LAB — CBC WITH DIFFERENTIAL/PLATELET
BASOS ABS: 0 10*3/uL (ref 0.0–0.1)
Basophils Relative: 0 %
EOS ABS: 0 10*3/uL (ref 0.0–0.7)
Eosinophils Relative: 0 %
HCT: 30.5 % — ABNORMAL LOW (ref 36.0–46.0)
HEMOGLOBIN: 10 g/dL — AB (ref 12.0–15.0)
LYMPHS PCT: 5 %
Lymphs Abs: 1.3 10*3/uL (ref 0.7–4.0)
MCH: 31.3 pg (ref 26.0–34.0)
MCHC: 32.8 g/dL (ref 30.0–36.0)
MCV: 95.3 fL (ref 78.0–100.0)
MONO ABS: 1.1 10*3/uL — AB (ref 0.1–1.0)
Monocytes Relative: 4 %
NEUTROS PCT: 91 %
Neutro Abs: 24.1 10*3/uL — ABNORMAL HIGH (ref 1.7–7.7)
PLATELETS: 270 10*3/uL (ref 150–400)
RBC: 3.2 MIL/uL — ABNORMAL LOW (ref 3.87–5.11)
RDW: 15.8 % — ABNORMAL HIGH (ref 11.5–15.5)
WBC: 26.5 10*3/uL — ABNORMAL HIGH (ref 4.0–10.5)

## 2016-11-25 LAB — URINALYSIS, ROUTINE W REFLEX MICROSCOPIC
Bilirubin Urine: NEGATIVE
GLUCOSE, UA: NEGATIVE mg/dL
Hgb urine dipstick: NEGATIVE
KETONES UR: NEGATIVE mg/dL
NITRITE: NEGATIVE
PH: 5 (ref 5.0–8.0)
Protein, ur: 100 mg/dL — AB
SPECIFIC GRAVITY, URINE: 1.012 (ref 1.005–1.030)

## 2016-11-25 LAB — COMPREHENSIVE METABOLIC PANEL
ALK PHOS: 61 U/L (ref 38–126)
ALT: 24 U/L (ref 14–54)
ANION GAP: 13 (ref 5–15)
AST: 36 U/L (ref 15–41)
Albumin: 2.7 g/dL — ABNORMAL LOW (ref 3.5–5.0)
BILIRUBIN TOTAL: 0.9 mg/dL (ref 0.3–1.2)
BUN: 65 mg/dL — ABNORMAL HIGH (ref 6–20)
CALCIUM: 8.9 mg/dL (ref 8.9–10.3)
CO2: 25 mmol/L (ref 22–32)
CREATININE: 3.68 mg/dL — AB (ref 0.44–1.00)
Chloride: 98 mmol/L — ABNORMAL LOW (ref 101–111)
GFR, EST AFRICAN AMERICAN: 13 mL/min — AB (ref >60)
GFR, EST NON AFRICAN AMERICAN: 11 mL/min — AB (ref >60)
Glucose, Bld: 160 mg/dL — ABNORMAL HIGH (ref 65–99)
Potassium: 3.6 mmol/L (ref 3.5–5.1)
Sodium: 136 mmol/L (ref 135–145)
TOTAL PROTEIN: 5.8 g/dL — AB (ref 6.5–8.1)

## 2016-11-25 LAB — I-STAT CG4 LACTIC ACID, ED
Lactic Acid, Venous: 2.06 mmol/L (ref 0.5–1.9)
Lactic Acid, Venous: 1.79 mmol/L (ref 0.5–1.9)

## 2016-11-25 LAB — I-STAT TROPONIN, ED: Troponin i, poc: 0.66 ng/mL (ref 0.00–0.08)

## 2016-11-25 LAB — TROPONIN I: TROPONIN I: 0.83 ng/mL — AB (ref <0.03)

## 2016-11-25 MED ORDER — INSULIN ASPART 100 UNIT/ML ~~LOC~~ SOLN
0.0000 [IU] | Freq: Three times a day (TID) | SUBCUTANEOUS | Status: DC
  Administered 2016-11-26: 2 [IU] via SUBCUTANEOUS
  Administered 2016-11-26 – 2016-11-29 (×4): 1 [IU] via SUBCUTANEOUS
  Administered 2016-11-29: 2 [IU] via SUBCUTANEOUS
  Administered 2016-11-30 (×3): 3 [IU] via SUBCUTANEOUS
  Administered 2016-12-01 (×3): 2 [IU] via SUBCUTANEOUS
  Administered 2016-12-02: 5 [IU] via SUBCUTANEOUS
  Administered 2016-12-02: 1 [IU] via SUBCUTANEOUS
  Administered 2016-12-02: 2 [IU] via SUBCUTANEOUS
  Administered 2016-12-03: 3 [IU] via SUBCUTANEOUS
  Administered 2016-12-03: 2 [IU] via SUBCUTANEOUS
  Administered 2016-12-03 – 2016-12-04 (×2): 3 [IU] via SUBCUTANEOUS
  Administered 2016-12-04 (×2): 2 [IU] via SUBCUTANEOUS
  Administered 2016-12-05: 5 [IU] via SUBCUTANEOUS
  Administered 2016-12-05: 3 [IU] via SUBCUTANEOUS
  Administered 2016-12-05: 4 [IU] via SUBCUTANEOUS
  Administered 2016-12-06: 3 [IU] via SUBCUTANEOUS
  Administered 2016-12-06: 2 [IU] via SUBCUTANEOUS
  Administered 2016-12-06: 5 [IU] via SUBCUTANEOUS
  Administered 2016-12-07: 3 [IU] via SUBCUTANEOUS
  Administered 2016-12-07 (×2): 2 [IU] via SUBCUTANEOUS

## 2016-11-25 MED ORDER — FERROUS SULFATE 325 (65 FE) MG PO TABS
325.0000 mg | ORAL_TABLET | Freq: Every day | ORAL | Status: DC
  Administered 2016-11-26 – 2016-11-28 (×3): 325 mg via ORAL
  Filled 2016-11-25 (×3): qty 1

## 2016-11-25 MED ORDER — INSULIN GLARGINE 100 UNIT/ML ~~LOC~~ SOLN
15.0000 [IU] | Freq: Every morning | SUBCUTANEOUS | Status: DC
  Administered 2016-11-26 – 2016-11-29 (×4): 15 [IU] via SUBCUTANEOUS
  Filled 2016-11-25 (×5): qty 0.15

## 2016-11-25 MED ORDER — PANTOPRAZOLE SODIUM 40 MG PO TBEC
40.0000 mg | DELAYED_RELEASE_TABLET | Freq: Every day | ORAL | Status: DC
  Administered 2016-11-25 – 2016-11-29 (×5): 40 mg via ORAL
  Filled 2016-11-25 (×5): qty 1

## 2016-11-25 MED ORDER — METOPROLOL TARTRATE 12.5 MG HALF TABLET
12.5000 mg | ORAL_TABLET | Freq: Two times a day (BID) | ORAL | Status: DC
  Administered 2016-11-25 – 2016-11-28 (×6): 12.5 mg via ORAL
  Filled 2016-11-25 (×6): qty 1

## 2016-11-25 MED ORDER — APIXABAN 5 MG PO TABS
5.0000 mg | ORAL_TABLET | Freq: Two times a day (BID) | ORAL | Status: DC
  Administered 2016-11-25 – 2016-11-26 (×2): 5 mg via ORAL
  Filled 2016-11-25 (×2): qty 1

## 2016-11-25 MED ORDER — PAROXETINE HCL 20 MG PO TABS
20.0000 mg | ORAL_TABLET | Freq: Every morning | ORAL | Status: DC
  Administered 2016-11-26 – 2016-11-28 (×3): 20 mg via ORAL
  Filled 2016-11-25 (×3): qty 1

## 2016-11-25 MED ORDER — FENOFIBRATE 54 MG PO TABS
54.0000 mg | ORAL_TABLET | Freq: Every day | ORAL | Status: DC
  Administered 2016-11-25 – 2016-11-29 (×5): 54 mg via ORAL
  Filled 2016-11-25 (×5): qty 1

## 2016-11-25 MED ORDER — IPRATROPIUM-ALBUTEROL 0.5-2.5 (3) MG/3ML IN SOLN
3.0000 mL | RESPIRATORY_TRACT | Status: DC | PRN
  Administered 2016-11-25: 3 mL via RESPIRATORY_TRACT
  Filled 2016-11-25: qty 3

## 2016-11-25 MED ORDER — LEVOFLOXACIN IN D5W 750 MG/150ML IV SOLN
750.0000 mg | Freq: Once | INTRAVENOUS | Status: AC
  Administered 2016-11-25: 750 mg via INTRAVENOUS
  Filled 2016-11-25: qty 150

## 2016-11-25 MED ORDER — MELATONIN 3 MG PO TABS
9.0000 mg | ORAL_TABLET | Freq: Every evening | ORAL | Status: DC | PRN
  Administered 2016-11-29: 9 mg via ORAL
  Filled 2016-11-25 (×3): qty 3

## 2016-11-25 MED ORDER — LEVOFLOXACIN IN D5W 750 MG/150ML IV SOLN
750.0000 mg | Freq: Once | INTRAVENOUS | Status: AC
  Administered 2016-11-26: 750 mg via INTRAVENOUS
  Filled 2016-11-25: qty 150

## 2016-11-25 MED ORDER — TORSEMIDE 20 MG PO TABS: 100.0000 mg | ORAL_TABLET | Freq: Every day | ORAL | Status: DC

## 2016-11-25 MED ORDER — HYDRALAZINE HCL 50 MG PO TABS
50.0000 mg | ORAL_TABLET | Freq: Three times a day (TID) | ORAL | Status: DC
  Administered 2016-11-25 – 2016-11-29 (×11): 50 mg via ORAL
  Filled 2016-11-25 (×11): qty 1

## 2016-11-25 MED ORDER — TORSEMIDE 20 MG PO TABS
100.0000 mg | ORAL_TABLET | ORAL | Status: DC
  Administered 2016-11-26: 100 mg via ORAL
  Filled 2016-11-25: qty 5

## 2016-11-25 MED ORDER — MORPHINE SULFATE (PF) 4 MG/ML IV SOLN
0.5000 mg | Freq: Once | INTRAVENOUS | Status: AC
  Administered 2016-11-25: 0.5 mg via INTRAVENOUS
  Filled 2016-11-25: qty 1

## 2016-11-25 MED ORDER — AMLODIPINE BESYLATE 10 MG PO TABS
10.0000 mg | ORAL_TABLET | Freq: Every day | ORAL | Status: DC
  Administered 2016-11-26 – 2016-11-29 (×4): 10 mg via ORAL
  Filled 2016-11-25 (×4): qty 1

## 2016-11-25 MED ORDER — ATORVASTATIN CALCIUM 80 MG PO TABS
80.0000 mg | ORAL_TABLET | Freq: Every day | ORAL | Status: DC
  Administered 2016-11-25 – 2016-12-07 (×12): 80 mg via ORAL
  Filled 2016-11-25 (×12): qty 1

## 2016-11-25 MED ORDER — TRAMADOL HCL 50 MG PO TABS
50.0000 mg | ORAL_TABLET | Freq: Four times a day (QID) | ORAL | Status: DC | PRN
  Administered 2016-11-26 – 2016-11-27 (×2): 50 mg via ORAL
  Filled 2016-11-25 (×2): qty 1

## 2016-11-25 MED ORDER — TORSEMIDE 20 MG PO TABS
50.0000 mg | ORAL_TABLET | ORAL | Status: DC
  Administered 2016-11-27: 50 mg via ORAL
  Filled 2016-11-25: qty 3

## 2016-11-25 MED ORDER — HYDROCODONE-HOMATROPINE 5-1.5 MG/5ML PO SYRP
5.0000 mL | ORAL_SOLUTION | Freq: Four times a day (QID) | ORAL | Status: DC | PRN
  Administered 2016-11-25 – 2016-11-27 (×4): 5 mL via ORAL
  Filled 2016-11-25 (×4): qty 5

## 2016-11-25 MED ORDER — SODIUM CHLORIDE 0.9 % IV BOLUS (SEPSIS)
500.0000 mL | Freq: Once | INTRAVENOUS | Status: AC
  Administered 2016-11-25: 500 mL via INTRAVENOUS

## 2016-11-25 NOTE — Progress Notes (Signed)
MD notified of increased work of breathing after NEB treatment. RT unable to keep sats above 90% on [place]. Changed to Venti mask at 55%. Sats increased to 93%. MD in room to see Pt at this time.

## 2016-11-25 NOTE — ED Notes (Signed)
Pt's room air oxygen saturation was 81%. Placed pt on 3 liters by nasal cannula.

## 2016-11-25 NOTE — ED Notes (Signed)
RN would not take report, stated she was busy at this time.

## 2016-11-25 NOTE — Progress Notes (Signed)
Paged by patient's nurse that she was having increased work of breathing and now on venti mask. Patient reports improvement in breathing after duoneb treatment. She coughed up bloody mucous per RT halfway through treatment. Patient says breathing is a lot easier when she is not coughing. Able to speak in complete sentences and AOx4. Will add continuous pulse oximetry.  Christine Floss, MD [place], PGY-3

## 2016-11-25 NOTE — ED Triage Notes (Signed)
Pt endorses left rib cage pain, prooductive cough with blood sputum x 2 weeks. Pt saw pcp and was placed on antibiotics for possible bronchitis/pna. Pt's spo2 90% on RA in triage, other VSS. Able to speak in complete sentences.

## 2016-11-25 NOTE — ED Notes (Addendum)
Attempted report again and RN would not take report again stating she was busy.

## 2016-11-25 NOTE — ED Notes (Signed)
Informed first nurse of troponin of 0.66

## 2016-11-25 NOTE — H&P (Signed)
[facility] Admission History and Physical Service Pager: [phone]  Patient name: Christine Santos Medical record number: [id number] Date of birth: [birth year] Age: 71 y.o. Gender: female  Primary Care Provider: [name], MD Consultants: None Code Status: Full  Chief Complaint: Cough and shortness of breath  Assessment and Plan: Christine Santos is a 71 y.o. female presenting with cough and dyspnea. PMH is significant for T2DM with peripheral neuropathy, CLL s/p chemotherapy, HTN, CKDVI, CAD s/p CABG, hx CVA in 2017, hx of afib on eliquis.   Acute hypoxic respiratory failure: New oxygen requirement. On 3L supplemental O2 in ED. Suspect pneumonia given cough, WBC to 26.5 (BL < 10), and low grade fever. CXR with consolidations of left mid/lower lung fields. On chronic anticoagulation, so PE less likely. Weight stable and no LE edema to suggest CHF exacerbation. Chest pain reproducible over sternum and left ribs.  - admit to med-surg, attending Dr. Mingo Amber - s/p Levaquin 750 mg in ED; repeat dose 11/3 as did not improve on doxycycline - duonebs q2h prn, as had wheezing when arrived on unit - hycodan 5 mL q6h prn - home tramadol, tylenol prn - supplemental O2 to maintain O2 sats >/= 92% - repeat CXR if not improving - trend CBC - Will need repeat lung imaging after follow-up as radiologist could not r/o malignancy  H/o diastolic CHF w/ h/o CAD s/p CABG 1996. Weight stable and fluctuates only a 1-2 lbs at a time. Taking torsemide daily, alternating doses of 50 mg and 100 mg; metoprolol tartrate 12.5 mg BID; norvasc 10 mg daily; atorvastatin 80 mg daily. Last echo 2017 showed EF of 55-60% with G1DD. Initial troponin elevated at 0.6 (chronically elevated in past to 0.4 when SCr was much better) - Continue home meds - I/Os - Daily weights - Trend trops  Afib: Has been seen by [facility] 01/2016. Asymptomatic at visit 01/27/16 but afib noted on linQ  implant. In sinus rhythm on exam and on EKG upon admission. - Continue eliquis  T2DM CBG 160 on admission. Last a1c 6.0 on 07/06/16, at home on lantus 37U daily with 5U humalog tid with meals. - monitor CBGs qACHS - SSI, lantus 15 U in a.m.  HTN Has had some elevated BPs 160s-180s but in setting of pain. At home on norvasc, metoprolol, torsemide - continue home meds - reordered home tramadol 50 mg qh prn  CKD stage 4 with anemia. AKI vs chronic worsening. At home on ferrous sulfate. On admit Hgb 10.0 with baseline ~10 and Cr 3.68 with last SCr 3.4 in 08/2016. Follows with [facility]  - monitor CBC and BMP - avoid nephrotoxic medications  HLD at home on fenofibrate and atorvastatin. last lipid panel 09/04/15. LDL 99. Triglycerides 188. - continue home meds  GERD at home on protonix - continue home med  Gout at home on allopurinol  - continue home med  Anxiety at home on paxil 20 mg daily - continue home med  FEN/GI: heart healthy, carb modified diet; protonix Prophylaxis: eliquis  Disposition: Home pending clinical improvement  History of Present Illness:  Christine Santos is a 71 y.o. female presenting with cough and shortness of breath x 2 days. Patient says she started feeling "lousy" 2 days ago. She has been coughing up mucous with some blood. She says it hurts when coughing, like she's "pulled something in [her] rib." Also reports fever and chills since yesterday morning. She reports fatigue and having no  energy. She says she saw her nephrologist Dr. [name] yesterday and he "did not even talk about my kidneys" due to concern for her cough. He started her on doxycycline, and she has taken a couple of doses. She denies sick contacts. Denies increased tightness in her legs or sensation of palpitations.      Review Of Systems: Per HPI with the following additions:   Review of Systems  Constitutional: Positive for chills, fever and malaise/fatigue.  HENT: Negative for  sinus pain and sore throat.   Eyes: Negative for double vision and pain.  Respiratory: Positive for cough, hemoptysis and shortness of breath.   Cardiovascular: Positive for chest pain (with coughing). Negative for leg swelling.  Gastrointestinal: Positive for nausea. Negative for constipation, diarrhea and vomiting.  Genitourinary: Positive for frequency. Negative for dysuria and urgency.  Musculoskeletal: Negative for falls.  Skin: Negative for rash.  Neurological: Negative for focal weakness and headaches.    Patient Active Problem List   Diagnosis Date Noted  . Hypoxia 11/25/2016  . Estrogen deficiency 03/28/202018  . History of fracture 07/21/2016  . Urinary tract infection 07/06/2016  . Paroxysmal atrial fibrillation ([employer]) 10/22/2015  . Acute on chronic kidney failure ([place]) 09/10/2015  . Acute lacunar infarction 09/03/2015  . Sinusitis, chronic 08/05/2015  . Weight loss, unintentional 07/02/2015  . Diabetes mellitus type 2 with neurological manifestations ([place]) 02/26/2014  . Hyperkalemia 06/21/2013  . Diabetic nephropathy ([place]) 05/23/2013  . Diabetic neuropathy ([place]) 11/30/2012  . Macular degeneration 07/04/2012  . Diabetic retinopathy ([place]) 03/20/2012  . Osteopenia 09/22/2011  . Foot pain 09/21/2011  . Anemia in chronic kidney disease 08/29/2011  . Gout 04/13/2011  . Stress incontinence, female 11/11/2010  . Anxiety 08/18/2010  . Chronic systolic heart failure ([place]) 02/18/2009  . SINUS BRADYCARDIA 10/29/2008  . Peripheral vascular disease ([place]) 06/18/2008  . Chronic kidney disease, stage IV (severe) ([place]) 07/13/2007  . Chronic lymphocytic leukemia ([place]) 10/13/2006  . Multiple complications of type II diabetes mellitus ([place]) 03/23/2006  . HYPERCHOLESTEROLEMIA 03/23/2006  . OBESITY, NOS 03/23/2006  . Essential hypertension 03/23/2006  . Coronary atherosclerosis 03/23/2006  . REFLUX ESOPHAGITIS 03/23/2006  . IRRITABLE BOWEL SYNDROME 03/23/2006  .  CYSTOCELE/RECTOCELE/PROLAPSE,UNSPEC. 03/23/2006  . Osteoarthritis, multiple sites 03/23/2006    Past Medical History: Past Medical History:  Diagnosis Date  . Anemia 08/29/2011  . Anxiety   . Arthritis    "legs" (01/22/2013)  . CAD (coronary artery disease)    s/p CABG in 1999  . Cataract   . Chronic kidney disease   . CLL (chronic lymphoblastic leukemia)   . Diastolic heart failure ([place])   . GERD (gastroesophageal reflux disease)   . H/O hiatal hernia   . Heart murmur   . Hyperlipidemia   . Hypertension   . IDDM (insulin dependent diabetes mellitus) ([place])   . PAD (peripheral artery disease) ([place])   . Paroxysmal atrial fibrillation ([facility])    a. identified on ILR as part of StrokeAF study  . Peripheral vascular disease ([place])   . Stroke (cerebrum) [facility])     Past Surgical History: Past Surgical History:  Procedure Laterality Date  . ANGIOPLASTY / STENTING FEMORAL Right 09/2010   SFA/notes 11/25/2010 (01/22/2013)  . ANGIOPLASTY / STENTING FEMORAL Left 11/2010   SFA/notes 11/25/2010 (01/22/2013)  . ANGIOPLASTY / STENTING ILIAC     [name] 11/25/2010 (01/22/2013)  . CATARACT EXTRACTION W/ INTRAOCULAR LENS  IMPLANT, BILATERAL Bilateral ?2011  . CHOLECYSTECTOMY  1993  . CORONARY ARTERY BYPASS GRAFT  10/1994   "  CABG X3"  . EP IMPLANTABLE DEVICE N/A 09/11/2015   Procedure: Loop Recorder Insertion;  Surgeon: Thompson Grayer, MD;  Location: [facility];  Service: Cardiovascular;  Laterality: N/A;  . HEEL SPUR EXCISION Bilateral 1970's  . LOWER EXTREMITY ANGIOGRAM N/A 01/22/2013   Procedure: LOWER EXTREMITY ANGIOGRAM;  Surgeon: [name], MD;  Location: [facility] CATH LAB;  Service: Cardiovascular;  Laterality: N/A;  . SHOULDER OPEN ROTATOR CUFF REPAIR Bilateral 1990's   "2 times on 1 side; once on the other"  . TRANSLUMINAL ATHERECTOMY FEMORAL ARTERY Right 01/22/2013   & balloon  . TUBAL LIGATION  1980's    Social History: Social History  Substance Use Topics  . Smoking  status: Never Smoker  . Smokeless tobacco: Never Used  . Alcohol use No   Additional social history: lives with companion, never smoked, no etoh  Please also refer to relevant sections of EMR.  Family History: Family History  Problem Relation Age of Onset  . Heart disease Mother   . Heart attack Mother   . Hypertension Mother   . Kidney disease Mother   . Pneumonia Father   . Heart attack Father   . Hyperlipidemia Father   . Diabetes Sister   . Breast cancer Sister   . Arthritis Brother   . Diabetes Brother   . Heart attack Sister   . Obesity Sister   . Hypertension Sister   . Heart attack Sister   . Cancer Sister        breast  . Hyperlipidemia Sister   . Hypertension Sister   . Hyperlipidemia Sister   . Breast cancer Maternal Grandmother     Allergies and Medications: Allergies  Allergen Reactions  . Other     Darvocet  . Penicillins   . Amoxicillin Rash    Has patient had a PCN reaction causing immediate rash, facial/tongue/throat swelling, SOB or lightheadedness with hypotension: No Has patient had a PCN reaction causing severe rash involving mucus membranes or skin necrosis: No Has patient had a PCN reaction that required hospitalization: No Has patient had a PCN reaction occurring within the last 10 years: No If all of the above answers are "NO", then may proceed with Cephalosporin use.   . Cephalexin Rash  . Codeine Phosphate Rash   No current facility-administered medications on file prior to encounter.    Current Outpatient Prescriptions on File Prior to Encounter  Medication Sig Dispense Refill  . allopurinol (ZYLOPRIM) 100 MG tablet TAKE 1 TABLET(100 MG) BY MOUTH DAILY 90 tablet 3  . amLODipine (NORVASC) 10 MG tablet TAKE 1 TABLET(10 MG) BY MOUTH DAILY 90 tablet 0  . Ascorbic Acid (VITAMIN C) 100 MG tablet Take 100 mg by mouth daily.    Marland Kitchen atorvastatin (LIPITOR) 80 MG tablet TAKE 1 TABLET BY MOUTH DAILY (Patient taking differently: TAKE 80 mg in the  evening) 90 tablet 3  . carboxymethylcellulose (REFRESH PLUS) 0.5 % SOLN 1 drop 3 (three) times daily as needed.    Marland Kitchen ELIQUIS 5 MG TABS tablet TAKE 1 TABLET(5 MG) BY MOUTH TWICE DAILY 60 tablet 11  . fenofibrate (TRICOR) 48 MG tablet Take 1 tablet (48 mg total) by mouth daily. (Patient taking differently: Take 48 mg by mouth at bedtime. ) 90 tablet 3  . ferrous sulfate 325 (65 FE) MG tablet Take 1 tablet (325 mg total) by mouth daily with breakfast.  3  . GNP GARLIC EXTRACT PO Take 1 tablet by mouth daily.     Marland Kitchen  hydrALAZINE (APRESOLINE) 50 MG tablet Take 1 tablet (50 mg total) by mouth 3 (three) times daily. 90 tablet 9  . Insulin Glargine (LANTUS SOLOSTAR) 100 UNIT/ML Solostar Pen Inject 37 Units into the skin daily.     . Melatonin 10 MG CAPS Take 10 mg by mouth at bedtime as needed (sleep).     . metoprolol tartrate (LOPRESSOR) 25 MG tablet TAKE 1/2 TABLET BY MOUTH TWICE DAILY (Patient taking differently: TAKE 12.5 TABLET BY MOUTH TWICE DAILY) 90 tablet 3  . Multiple Vitamins-Minerals (MULTIVITAMINS THER. W/MINERALS) TABS Take 1 tablet by mouth daily.      Marland Kitchen omega-3 acid ethyl esters (LOVAZA) 1 g capsule Take 2 g by mouth 2 (two) times daily.     . pantoprazole (PROTONIX) 40 MG tablet TAKE 1 TABLET BY MOUTH DAILY (Patient taking differently: TAKE 40 mh TABLET BY MOUTH DAILY) 90 tablet 3  . PARoxetine (PAXIL) 20 MG tablet TAKE 1 TABLET BY MOUTH EVERY MORNING (Patient taking differently: TAKE 20 mg TABLET BY MOUTH EVERY MORNING) 90 tablet 3  . torsemide (DEMADEX) 100 MG tablet Take 100 mg by mouth daily.    . traMADol (ULTRAM) 50 MG tablet TAKE 1 TABLET BY MOUTH FOUR TIMES DAILY (Patient taking differently: TAKE 50 mg TABLET BY MOUTH FOUR TIMES DAILY) 360 tablet 1    Objective: BP (!) 160/59   Pulse 82   Temp 99.1 F (37.3 C) (Oral)   Resp (!) 39   Ht 5\' 5"  (1.651 m)   Wt 172 lb (78 kg)   SpO2 94%   BMI 28.62 kg/m  Exam: General: Tired appearing female, resting in bed with [place] in  place Eyes: PERRLA, EOMI ENTM: Tacky MM, normal posterior oropharynx Neck: Supple, FROM Cardiovascular: RRR, S1S2, no murmur appreciated Respiratory: No increased WOB, speaking in complete sentences with ease, decreased breath sounds at bases with crackles to midlung bilaterally Gastrointestinal: soft, NT, ND, +BS; abdominal fullness at suprapubic region MSK: No LE edema, normal bulk Derm: No rashes noted, no wounds Neuro: AOx3, moves all extremities Psych: Somewhat anxious, normal affect  Labs and Imaging: CBC BMET   Recent Labs Lab 11/25/16 1503  WBC 26.5*  HGB 10.0*  HCT 30.5*  PLT 270    Recent Labs Lab 11/25/16 1503  NA 136  K 3.6  CL 98*  CO2 25  BUN 65*  CREATININE 3.68*  GLUCOSE 160*  CALCIUM 8.9     Dg Chest 2 View  Result Date: 11/25/2016 CLINICAL DATA:  71 y/o  F; chest pain with cough and hemoptysis. EXAM: CHEST  2 VIEW COMPARISON:  04/07/2014 PET-CT.  01/10/2009 chest radiograph. FINDINGS: Mild cardiomegaly. Aortic atherosclerosis with calcification. Status post CABG. Median sternotomy wires are aligned. Extensive consolidations throughout the left mid and lower lung zones. Small to moderate left effusion. Right upper quadrant cholecystectomy clips. Left humerus rotator cuff repair anchor. Multilevel degenerative changes of the spine. IMPRESSION: Extensive consolidation throughout left lung and small to moderate left effusion probably representing pneumonia. Follow-up to resolution is recommended to exclude underlying malignancy. Electronically Signed   By: Kristine Garbe M.D.   On: 11/25/2016 15:23    [place] Buss[place], MD 11/25/2016, 6:07 PM PGY-3, [place] pager: [phone], text pages welcome wa

## 2016-11-25 NOTE — ED Notes (Signed)
Informed first nurse of lactic acid 2.06

## 2016-11-25 NOTE — ED Provider Notes (Signed)
[facility] EMERGENCY DEPARTMENT Provider Note   CSN: [id number] Arrival date & time: 11/25/16  1415 History   Chief Complaint Chief Complaint  Patient presents with  . Cough  . Chest Pain   HPI Christine Santos is a 71 y.o. female with past medical history of CAD, CKD, and hypertension who presents with chest pain and shortness of breath.  Patient states that for the last 2 weeks she has had progressively worsening shortness of breath and chest pain.  Over the past 2-3 days she has developed fevers and chills at home.  She is also had associated productive cough, green/yellowish colored sputum.  Shortness of breath is made worse with exertion.  Somewhat relieved with rest.  The history is provided by the patient.   Past Medical History:  Diagnosis Date  . Anemia 08/29/2011  . Anxiety   . Arthritis    "legs" (01/22/2013)  . CAD (coronary artery disease)    s/p CABG in 1999  . Cataract   . Chronic kidney disease   . CLL (chronic lymphoblastic leukemia)   . Diastolic heart failure ([place])   . GERD (gastroesophageal reflux disease)   . H/O hiatal hernia   . Heart murmur   . Hyperlipidemia   . Hypertension   . IDDM (insulin dependent diabetes mellitus) ([place])   . PAD (peripheral artery disease) ([place])   . Paroxysmal atrial fibrillation (HCC)    a. identified on ILR as part of StrokeAF study  . Peripheral vascular disease ([place])   . Stroke (cerebrum) [facility])     Patient Active Problem List   Diagnosis Date Noted  . Estrogen deficiency 2020/04/416  . History of fracture 07/21/2016  . Urinary tract infection 07/06/2016  . Paroxysmal atrial fibrillation ([place]) 10/22/2015  . Acute on chronic kidney failure ([place]) 09/10/2015  . Acute lacunar infarction 09/03/2015  . Sinusitis, chronic 08/05/2015  . Weight loss, unintentional 07/02/2015  . Diabetes mellitus type 2 with neurological manifestations ([place]) 02/26/2014  . Hyperkalemia 06/21/2013  . Diabetic nephropathy ([place])  05/23/2013  . Diabetic neuropathy ([place]) 11/30/2012  . Macular degeneration 07/04/2012  . Diabetic retinopathy ([place]) 03/20/2012  . Osteopenia 09/22/2011  . Foot pain 09/21/2011  . Anemia in chronic kidney disease 08/29/2011  . Gout 04/13/2011  . Stress incontinence, female 11/11/2010  . Anxiety 08/18/2010  . Chronic systolic heart failure ([place]) 02/18/2009  . SINUS BRADYCARDIA 10/29/2008  . Peripheral vascular disease ([place]) 06/18/2008  . Chronic kidney disease, stage IV (severe) ([place]) 07/13/2007  . Chronic lymphocytic leukemia ([place]) 10/13/2006  . 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 HYPERCHOLESTEROLEMIA 03/23/2006  . OBESITY, NOS 03/23/2006  . Essential hypertension 03/23/2006  . Coronary atherosclerosis 03/23/2006  . REFLUX ESOPHAGITIS 03/23/2006  . IRRITABLE BOWEL SYNDROME 03/23/2006  . CYSTOCELE/RECTOCELE/PROLAPSE,UNSPEC. 03/23/2006  . Osteoarthritis, multiple sites 03/23/2006    Past Surgical History:  Procedure Laterality Date  . ANGIOPLASTY / STENTING FEMORAL Right 09/2010   SFA/notes 11/25/2010 (01/22/2013)  . ANGIOPLASTY / STENTING FEMORAL Left 11/2010   SFA/notes 11/25/2010 (01/22/2013)  . ANGIOPLASTY / STENTING ILIAC     [name] 11/25/2010 (01/22/2013)  . CATARACT EXTRACTION W/ INTRAOCULAR LENS  IMPLANT, BILATERAL Bilateral ?2011  . CHOLECYSTECTOMY  1993  . CORONARY ARTERY BYPASS GRAFT  10/1994   "CABG X3"  . EP IMPLANTABLE DEVICE N/A 09/11/2015   Procedure: Loop Recorder Insertion;  Surgeon: Thompson Grayer, MD;  Location: [facility] CV LAB;  Service: Cardiovascular;  Laterality: N/A;  . HEEL SPUR  EXCISION Bilateral 1970's  . LOWER EXTREMITY ANGIOGRAM N/A 01/22/2013   Procedure: LOWER EXTREMITY ANGIOGRAM;  Surgeon: [name], MD;  Location: [facility] CATH LAB;  Service: Cardiovascular;  Laterality: N/A;  . SHOULDER OPEN ROTATOR CUFF REPAIR Bilateral 1990's   "2 times on 1 side; once on the other"  . TRANSLUMINAL ATHERECTOMY FEMORAL  ARTERY Right 01/22/2013   & balloon  . TUBAL LIGATION  1980's   OB History    No data available     Home Medications    Prior to Admission medications   Medication Sig Start Date End Date Taking? Authorizing Provider  allopurinol (ZYLOPRIM) 100 MG tablet TAKE 1 TABLET(100 MG) BY MOUTH DAILY 11/21/16   [name], MD  amLODipine (NORVASC) 10 MG tablet TAKE 1 TABLET(10 MG) BY MOUTH DAILY 11/25/16   McDiarmid, Blane Ohara, MD  Ascorbic Acid (VITAMIN C) 100 MG tablet Take 100 mg by mouth daily.    [provider]  atorvastatin (LIPITOR) 80 MG tablet TAKE 1 TABLET BY MOUTH DAILY 08/26/16   [name], MD  carboxymethylcellulose (REFRESH PLUS) 0.5 % SOLN 1 drop 3 (three) times daily as needed.    [provider]  ELIQUIS 5 MG TABS tablet TAKE 1 TABLET(5 MG) BY MOUTH TWICE DAILY 10/03/16   [name], MD  fenofibrate (TRICOR) 48 MG tablet Take 1 tablet (48 mg total) by mouth daily. 12/10/15   [name], MD  ferrous sulfate 325 (65 FE) MG tablet Take 1 tablet (325 mg total) by mouth daily with breakfast. 05/23/13   [name], MD  glucose blood (ACCU-CHEK AVIVA PLUS) test strip TEST three times a day 03/24/16   [name], MD  GNP GARLIC EXTRACT PO Take 1 tablet by mouth daily.     [provider]  hydrALAZINE (APRESOLINE) 50 MG tablet Take 1 tablet (50 mg total) by mouth 3 (three) times daily. 09/22/16   [name], MD  Insulin Glargine (LANTUS SOLOSTAR) 100 UNIT/ML Solostar Pen Inject 37 Units into the skin daily at 10 pm.    [provider]  insulin lispro (HUMALOG KWIKPEN) 100 UNIT/ML KiwkPen ADMINISTER 5 UNITS UNDER THE SKIN THREE TIMES DAILY BEFORE MEALS Prime pen with 2 units prior to administering each dose 08/16/16   Hensel, Jamal Collin, MD  Insulin Pen Needle (B-D UF III MINI PEN NEEDLES) 31G X 5 MM MISC as directed once daily.  Uses four needles per day 03/30/16   [name], MD  Melatonin 10 MG CAPS  Take 10 mg by mouth at bedtime as needed (sleep).     [provider]  metoprolol tartrate (LOPRESSOR) 25 MG tablet TAKE 1/2 TABLET BY MOUTH TWICE DAILY 06/07/16   [name], MD  Multiple Vitamins-Minerals (MULTIVITAMINS THER. W/MINERALS) TABS Take 1 tablet by mouth daily.      [provider]  omega-3 acid ethyl esters (LOVAZA) 1 g capsule Take 2 g by mouth 2 (two) times daily.     [provider]  pantoprazole (PROTONIX) 40 MG tablet TAKE 1 TABLET BY MOUTH DAILY 05/31/16   [name], MD  PARoxetine (PAXIL) 20 MG tablet TAKE 1 TABLET BY MOUTH EVERY MORNING 10/24/16   [name], MD  torsemide (DEMADEX) 100 MG tablet Take 100 mg by mouth daily.    [provider]  traMADol (ULTRAM) 50 MG tablet TAKE 1 TABLET BY MOUTH FOUR TIMES DAILY 09/22/16   Andria Frames Jamal Collin, MD  Family History Family History  Problem Relation Age of Onset  . Heart disease Mother   . Heart attack Mother   . Hypertension Mother   . Kidney disease Mother   . Pneumonia Father   . Heart attack Father   . Hyperlipidemia Father   . Diabetes Sister   . Breast cancer Sister   . Arthritis Brother   . Diabetes Brother   . Heart attack Sister   . Obesity Sister   . Hypertension Sister   . Heart attack Sister   . Cancer Sister        breast  . Hyperlipidemia Sister   . Hypertension Sister   . Hyperlipidemia Sister   . Breast cancer Maternal Grandmother    Social History Social History  Substance Use Topics  . Smoking status: Never Smoker  . Smokeless tobacco: Never Used  . Alcohol use No   Allergies   Other; Penicillins; Amoxicillin; Cephalexin; and Codeine phosphate  Review of Systems Review of Systems  Constitutional: Positive for chills, fatigue and fever.  HENT: Negative for ear pain and sore throat.   Eyes: Negative for pain and visual disturbance.  Respiratory: Positive for chest tightness and shortness of breath. Negative for cough.     Cardiovascular: Positive for chest pain. Negative for palpitations.  Gastrointestinal: Negative for abdominal pain and vomiting.  Genitourinary: Negative for dysuria and hematuria.  Musculoskeletal: Negative for arthralgias and back pain.  Skin: Negative for color change and rash.  Neurological: Positive for weakness. Negative for seizures and syncope.  All other systems reviewed and are negative.  Physical Exam Updated Vital Signs BP (!) 166/63 (BP Location: Right Arm)   Pulse 81   Temp 99.1 F (37.3 C) (Oral)   Resp (!) 30   Ht 5\' 5"  (1.651 m)   Wt 78 kg (172 lb)   SpO2 (!) 83%   BMI 28.62 kg/m   Physical Exam  Constitutional: She appears well-developed and well-nourished. No distress.  HENT:  Head: Normocephalic and atraumatic.  Eyes: Conjunctivae are normal.  Neck: Neck supple.  Cardiovascular: Normal rate and regular rhythm.   No murmur heard. Pulmonary/Chest: Tachypnea noted. She has decreased breath sounds. She has wheezes. She has rhonchi. She has rales.  Abdominal: Soft. There is no tenderness.  Musculoskeletal: She exhibits no edema.  Neurological: She is alert.  Skin: Skin is warm and dry.  Psychiatric: She has a normal mood and affect.  Nursing note and vitals reviewed.  ED Treatments / Results  Labs (all labs ordered are listed, but only abnormal results are displayed) Labs Reviewed  COMPREHENSIVE METABOLIC PANEL - Abnormal; Notable for the following:       Result Value   Chloride 98 (*)    Glucose, Bld 160 (*)    BUN 65 (*)    Creatinine, Ser 3.68 (*)    Total Protein 5.8 (*)    Albumin 2.7 (*)    GFR calc non Af Amer 11 (*)    GFR calc Af Amer 13 (*)    All other components within normal limits  CBC WITH DIFFERENTIAL/PLATELET - Abnormal; Notable for the following:    WBC 26.5 (*)    RBC 3.20 (*)    Hemoglobin 10.0 (*)    HCT 30.5 (*)    RDW 15.8 (*)    Neutro Abs 24.1 (*)    Monocytes Absolute 1.1 (*)    All other components within normal  limits  I-STAT CG4 LACTIC ACID, ED - Abnormal;  Notable for the following:    Lactic Acid, Venous 2.06 (*)    All other components within normal limits  I-STAT TROPONIN, ED - Abnormal; Notable for the following:    Troponin i, poc 0.66 (*)    All other components within normal limits  URINALYSIS, ROUTINE W REFLEX MICROSCOPIC  I-STAT CG4 LACTIC ACID, ED    EKG  EKG Interpretation None       Radiology Dg Chest 2 View  Result Date: 11/25/2016 CLINICAL DATA:  71 y/o  F; chest pain with cough and hemoptysis. EXAM: CHEST  2 VIEW COMPARISON:  04/07/2014 PET-CT.  01/10/2009 chest radiograph. FINDINGS: Mild cardiomegaly. Aortic atherosclerosis with calcification. Status post CABG. Median sternotomy wires are aligned. Extensive consolidations throughout the left mid and lower lung zones. Small to moderate left effusion. Right upper quadrant cholecystectomy clips. Left humerus rotator cuff repair anchor. Multilevel degenerative changes of the spine. IMPRESSION: Extensive consolidation throughout left lung and small to moderate left effusion probably representing pneumonia. Follow-up to resolution is recommended to exclude underlying malignancy. Electronically Signed   By: Kristine Garbe M.D.   On: 11/25/2016 15:23    Procedures Procedures (including critical care time)  Medications Ordered in ED Medications - No data to display   Initial Impression / Assessment and Plan / ED Course  I have reviewed the triage vital signs and the nursing notes.  Pertinent labs & imaging results that were available during my care of the patient were reviewed by me and considered in my medical decision making (see chart for details).  Christine Santos is a 71 y.o. female with past medical history of CAD, CKD, CVA, and hypertension who presents with aggressively worsening shortness of breath and chest pain found to have left lower lobe pneumonia.  On my initial assessment patient she appears tachypneic  and is requiring 3 L of supplemental oxygen.  Patient quickly desats into the mid 80s when oxygen was removed.  She is tachycardic and normotensive.  Lab studies revealed: CMP remarkable for creatinine of 3.68 similar to patient's previous most recent creatinine of 3.4 in August 2018.  CBC notable for leukocytosis of 26,000, chronic anemia with heme globin of 10, normal platelets, elevated troponin 0 0.66, show a lactic acid of 2.06  Chest x-ray: Extensive consolidation throughout left lung and small to moderate left effusion probably representing pneumonia  Patient admitted for IV antibiotics for pneumonia   Discussed this patient's care with medicine team who will admit the patient for further evaluation and treatment.  Final Clinical Impressions(s) / ED Diagnoses   Final diagnoses:  Community acquired pneumonia of left lower lobe of lung ([place])  Elevated troponin  Chest pain, unspecified type  Cough    New Prescriptions New Prescriptions   No medications on file     Fenton Foy, MD 11/26/16 4627    Lajean Saver, MD 11/26/16 [phone]

## 2016-11-25 NOTE — ED Notes (Signed)
Pt reports having some increased coughing for 2-3 weeks & chest pain that started 2 days ago. Pt was seen by her kidney doctor and told to come to the ED.

## 2016-11-26 ENCOUNTER — Inpatient Hospital Stay (HOSPITAL_COMMUNITY): Payer: Medicare Other

## 2016-11-26 DIAGNOSIS — R079 Chest pain, unspecified: Secondary | ICD-10-CM

## 2016-11-26 DIAGNOSIS — R748 Abnormal levels of other serum enzymes: Secondary | ICD-10-CM

## 2016-11-26 DIAGNOSIS — R7989 Other specified abnormal findings of blood chemistry: Secondary | ICD-10-CM

## 2016-11-26 DIAGNOSIS — J189 Pneumonia, unspecified organism: Secondary | ICD-10-CM

## 2016-11-26 DIAGNOSIS — R0902 Hypoxemia: Secondary | ICD-10-CM

## 2016-11-26 DIAGNOSIS — J181 Lobar pneumonia, unspecified organism: Secondary | ICD-10-CM

## 2016-11-26 DIAGNOSIS — J90 Pleural effusion, not elsewhere classified: Secondary | ICD-10-CM

## 2016-11-26 DIAGNOSIS — R778 Other specified abnormalities of plasma proteins: Secondary | ICD-10-CM

## 2016-11-26 LAB — LACTIC ACID, PLASMA
Lactic Acid, Venous: 1 mmol/L (ref 0.5–1.9)
Lactic Acid, Venous: 1.1 mmol/L (ref 0.5–1.9)

## 2016-11-26 LAB — BASIC METABOLIC PANEL
Anion gap: 13 (ref 5–15)
BUN: 72 mg/dL — ABNORMAL HIGH (ref 6–20)
CO2: 25 mmol/L (ref 22–32)
Calcium: 8.6 mg/dL — ABNORMAL LOW (ref 8.9–10.3)
Chloride: 98 mmol/L — ABNORMAL LOW (ref 101–111)
Creatinine, Ser: 4.13 mg/dL — ABNORMAL HIGH (ref 0.44–1.00)
GFR, EST AFRICAN AMERICAN: 12 mL/min — AB (ref >60)
GFR, EST NON AFRICAN AMERICAN: 10 mL/min — AB (ref >60)
Glucose, Bld: 141 mg/dL — ABNORMAL HIGH (ref 65–99)
POTASSIUM: 3.9 mmol/L (ref 3.5–5.1)
SODIUM: 136 mmol/L (ref 135–145)

## 2016-11-26 LAB — BLOOD GAS, ARTERIAL
ACID-BASE EXCESS: 1.5 mmol/L (ref 0.0–2.0)
BICARBONATE: 25.4 mmol/L (ref 20.0–28.0)
DRAWN BY: 213381
FIO2: 50
O2 Saturation: 90.3 %
PATIENT TEMPERATURE: 98.6
pCO2 arterial: 39.3 mmHg (ref 32.0–48.0)
pH, Arterial: 7.427 (ref 7.350–7.450)
pO2, Arterial: 60.6 mmHg — ABNORMAL LOW (ref 83.0–108.0)

## 2016-11-26 LAB — CBC
HEMATOCRIT: 32 % — AB (ref 36.0–46.0)
HEMOGLOBIN: 10.4 g/dL — AB (ref 12.0–15.0)
MCH: 31 pg (ref 26.0–34.0)
MCHC: 32.5 g/dL (ref 30.0–36.0)
MCV: 95.2 fL (ref 78.0–100.0)
Platelets: 254 10*3/uL (ref 150–400)
RBC: 3.36 MIL/uL — ABNORMAL LOW (ref 3.87–5.11)
RDW: 15.6 % — ABNORMAL HIGH (ref 11.5–15.5)
WBC: 23.7 10*3/uL — ABNORMAL HIGH (ref 4.0–10.5)

## 2016-11-26 LAB — GLUCOSE, CAPILLARY
GLUCOSE-CAPILLARY: 134 mg/dL — AB (ref 65–99)
GLUCOSE-CAPILLARY: 157 mg/dL — AB (ref 65–99)
Glucose-Capillary: 150 mg/dL — ABNORMAL HIGH (ref 65–99)

## 2016-11-26 LAB — TROPONIN I
TROPONIN I: 0.84 ng/mL — AB (ref <0.03)
Troponin I: 0.67 ng/mL (ref <0.03)

## 2016-11-26 MED ORDER — BENZONATATE 100 MG PO CAPS
200.0000 mg | ORAL_CAPSULE | Freq: Two times a day (BID) | ORAL | Status: DC | PRN
  Administered 2016-11-26: 200 mg via ORAL
  Filled 2016-11-26: qty 2

## 2016-11-26 MED ORDER — INFLUENZA VAC SPLIT HIGH-DOSE 0.5 ML IM SUSY
0.5000 mL | PREFILLED_SYRINGE | INTRAMUSCULAR | Status: AC
  Administered 2016-11-27: 0.5 mL via INTRAMUSCULAR
  Filled 2016-11-26: qty 0.5

## 2016-11-26 MED ORDER — METOPROLOL TARTRATE 5 MG/5ML IV SOLN
2.5000 mg | Freq: Once | INTRAVENOUS | Status: AC
  Administered 2016-11-26: 2.5 mg via INTRAVENOUS
  Filled 2016-11-26: qty 5

## 2016-11-26 MED ORDER — LEVALBUTEROL HCL 0.63 MG/3ML IN NEBU: 0.6300 mg | INHALATION_SOLUTION | Freq: Four times a day (QID) | RESPIRATORY_TRACT | Status: DC | PRN

## 2016-11-26 MED ORDER — BENZONATATE 100 MG PO CAPS
200.0000 mg | ORAL_CAPSULE | Freq: Once | ORAL | Status: AC
  Administered 2016-11-26: 200 mg via ORAL
  Filled 2016-11-26: qty 2

## 2016-11-26 NOTE — Progress Notes (Signed)
Family Medicine Teaching Service Daily Progress Note Intern Pager: [phone]  Patient name: Christine Santos Medical record number: [id number] Date of birth: 1945/05/15 Age: 71 y.o. Gender: female  Primary Care Provider: [name], MD Consultants: None Code Status: Full   Pt Overview and Major Events to Date:  11/2: Patient admitted for acute hypoxic respiratory failure, treatment started for PNA   Assessment and Plan: Christine Santos is a 71 y.o. female presenting with cough and dyspnea. PMH is significant for T2DM with peripheral neuropathy, CLL s/p chemotherapy, HTN, CKDVI, CAD s/p CABG, hx CVA in 2017, hx of afib on eliquis.   Acute hypoxic respiratory failure: New oxygen requirement. Suspect pneumonia given cough, WBC to 26.5 (BL < 10), and low grade fever. CXR with consolidations of left mid/lower lung fields. Required Venturi mask overnight.  - Levaquin 750 mg (11/2- )  - xopenox for elevated HRs  - hycodan 5 mL q6h prn - home tramadol, tylenol prn - supplemental O2 to maintain O2 sats >/= 92% - trend CBC; leukocytosis mildly improved to 23.7  - CT chest to characterize effusion seen on CXR   H/o diastolic CHF w/ h/o CAD s/p CABG 1996.Weight stable and fluctuates only a 1-2 lbs at a time. Taking torsemide daily, alternating doses of 50 mg and 100 mg; metoprolol tartrate 12.5 mg BID; norvasc 10 mg daily; atorvastatin 80 mg daily. Last echo 2017 showed EF of 55-60% with G1DD. Initial troponin elevated at 0.6 (chronically elevated in past to 0.4 when SCr was much better) - Continue home meds - I/Os - Daily weights - Trend trops  Afib: With RVR overnight. Given IV Lopressor 2.5 mg. HR now 90-100s.  - Continue eliquis -repeat EKG   T2DM CBG 160 on admission. Last a1c 6.0 on 07/06/16, at home on lantus 37U daily with 5U humalog tid with meals. - monitor CBGs qACHS - SSI, lantus 15 U in a.m.  HTN: Normotensive.. At home on norvasc, metoprolol, torsemide - continue home  meds  CKD stage 4 with anemia. AKI vs chronic worsening.At home on ferrous sulfate. On admit Hgb 10.0 with baseline ~10 and Cr 3.68 with last SCr 3.4 in 08/2016. Follows with [employer] Cr uptrended to 4.1.  - monitor CBC and BMP - avoid nephrotoxic medications -holding torsemide   HLD at home on fenofibrate and atorvastatin.last lipid panel 09/04/15. LDL 99. Triglycerides 188. - continue home meds  GERDat home on protonix - continue home med  Goutat home on allopurinol  - continue home med  Anxiety at home on paxil 20 mg daily - continue home med  FEN/GI: heart healthy, carb modified diet; protonix Prophylaxis: eliquis  Disposition: Pending clinical improvement from PNA   Subjective:  Patient reports feeling better although still has significant SOB with coughing. Cough medications have helped symptoms.   Objective: Temp:  [98.6 F (37 C)-100.2 F (37.9 C)] 98.6 F (37 C) (11/03 0502) Pulse Rate:  [78-124] 105 (11/03 0914) Resp:  [17-39] 20 (11/03 0502) BP: (105-182)/(44-116) 115/66 (11/03 0914) SpO2:  [82 %-98 %] 94 % (11/03 0502) FiO2 (%):  [50 %-55 %] 50 % (11/03 0455) Weight:  [172 lb (78 kg)-172 lb 14.4 oz (78.4 kg)] 172 lb 14.4 oz (78.4 kg) (11/03 0502) Physical Exam: General: elderly female lying in bed, venturi mask in place  Cardiovascular: Tachycardic irregularly irregular. No murmur appreciated.  Respiratory: Decreased breath sounds at baselines bilaterally with crackles L >R. Increased WOB after coughing spells.  Abdomen: soft, NTND  Extremities:  No LE edema. Moves all extremities spontaneously.   Laboratory:  Recent Labs Lab 11/25/16 1503 11/26/16 0229  WBC 26.5* 23.7*  HGB 10.0* 10.4*  HCT 30.5* 32.0*  PLT 270 254    Recent Labs Lab 11/25/16 1503 11/26/16 0229  NA 136 136  K 3.6 3.9  CL 98* 98*  CO2 25 25  BUN 65* 72*  CREATININE 3.68* 4.13*  CALCIUM 8.9 8.6*  PROT 5.8*  --   BILITOT 0.9  --   ALKPHOS 61  --   ALT 24   --   AST 36  --   GLUCOSE 160* 141*     Imaging/Diagnostic Tests: Dg Chest 2 View  Result Date: 11/25/2016 CLINICAL DATA:  71 y/o  F; chest pain with cough and hemoptysis. EXAM: CHEST  2 VIEW COMPARISON:  04/07/2014 PET-CT.  01/10/2009 chest radiograph. FINDINGS: Mild cardiomegaly. Aortic atherosclerosis with calcification. Status post CABG. Median sternotomy wires are aligned. Extensive consolidations throughout the left mid and lower lung zones. Small to moderate left effusion. Right upper quadrant cholecystectomy clips. Left humerus rotator cuff repair anchor. Multilevel degenerative changes of the spine. IMPRESSION: Extensive consolidation throughout left lung and small to moderate left effusion probably representing pneumonia. Follow-up to resolution is recommended to exclude underlying malignancy. Electronically Signed   By: Kristine Garbe M.D.   On: 11/25/2016 15:23    Nicolette Bang, DO 11/26/2016, 9:23 AM PGY-3, [place] Intern pager: [phone], text pages welcome

## 2016-11-26 NOTE — Progress Notes (Signed)
Critical troponin 0.83 increased from 0.66 called to Dr. Ola Spurr.  Will recheck troponin in 6 hrs. MD came to room and explained troponin to patient and sister at bedside.

## 2016-11-26 NOTE — Progress Notes (Signed)
Notified MD of increased RR and wheezing noted on assessment. Orders for Duonebs given. Also notified of Troponin 0.66 in ER. Troponins Q6 hrs added.

## 2016-11-26 NOTE — Progress Notes (Signed)
MD notified of HR increased to 120's. Tele applied shows A. Fib. New orders for continuous telemetry, 2.5 mg IV lopressor and nebs changed to xopenex.

## 2016-11-26 NOTE — Consult Note (Signed)
PULMONARY / CRITICAL CARE MEDICINE   Name: Christine Santos MRN: [id number] DOB: 05-27-1945    ADMISSION DATE:  11/25/2016 CONSULTATION DATE:  11/26/2016    CHIEF COMPLAINT: hemoptysis and dyspnea.  HISTORY OF PRESENT ILLNESS:   Past medical history of congestive heart failure, CLL, tonic atrial fibrillation, CVA, and coronary artery disease who presented with dyspnea following 2 days of hemoptysis.  She is chronically on Eliquis due to her history of atrial fibrillation and prior CVA.  She was started on Levaquin empirically for community-acquired pneumonia.  Her oxygen requirement is going up but she says that she feels better.  No longer has a productive cough and she does not have any subjective fevers chills or sweats.  At baseline she respiratory complaints.  No unusual dyspnea with exertion, and has no known history of chronic lung disease.  CT scan of the chest today has shown a moderate to large left pleural effusion with dense consolidation of the left lung suggesting proximal airway obstruction to my eye.  PAST MEDICAL HISTORY :  She  has a past medical history of Anemia (08/29/2011); Anxiety; Arthritis; CAD (coronary artery disease); Cataract; Chronic kidney disease; CLL (chronic lymphoblastic leukemia); Diastolic heart failure (HCC); GERD (gastroesophageal reflux disease); H/O hiatal hernia; Heart murmur; Hyperlipidemia; Hypertension; IDDM (insulin dependent diabetes mellitus) ([place]); PAD (peripheral artery disease) ([place]); Paroxysmal atrial fibrillation ([place]); Peripheral vascular disease ([place]); and Stroke (cerebrum) ([place]).  PAST SURGICAL HISTORY: She  has a past surgical history that includes Cataract extraction w/ intraocular lens  implant, bilateral (Bilateral, ?2011); Excision heel spur excision (Bilateral, 1970's); Cholecystectomy (1993); Tubal ligation (1980's); Coronary artery bypass graft (10/1994); Shoulder open rotator cuff repair (Bilateral, 1990's); Angioplasty / stenting iliac;  Angioplasty / stenting femoral (Right, 09/2010); Angioplasty / stenting femoral (Left, 11/2010); Transluminal atherectomy femoral artery (Right, 01/22/2013); lower extremity angiogram (N/A, 01/22/2013); and Cardiac catheterization (N/A, 09/11/2015).  Allergies  Allergen Reactions  . Other     Darvocet  . Penicillins   . Amoxicillin Rash    Has patient had a PCN reaction causing immediate rash, facial/tongue/throat swelling, SOB or lightheadedness with hypotension: No Has patient had a PCN reaction causing severe rash involving mucus membranes or skin necrosis: No Has patient had a PCN reaction that required hospitalization: No Has patient had a PCN reaction occurring within the last 10 years: No If all of the above answers are "NO", then may proceed with Cephalosporin use.   . Cephalexin Rash  . Codeine Phosphate Rash    No current facility-administered medications on file prior to encounter.    Current Outpatient Prescriptions on File Prior to Encounter  Medication Sig  . allopurinol (ZYLOPRIM) 100 MG tablet TAKE 1 TABLET(100 MG) BY MOUTH DAILY  . amLODipine (NORVASC) 10 MG tablet TAKE 1 TABLET(10 MG) BY MOUTH DAILY  . Ascorbic Acid (VITAMIN C) 100 MG tablet Take 100 mg by mouth daily.  Marland Kitchen atorvastatin (LIPITOR) 80 MG tablet TAKE 1 TABLET BY MOUTH DAILY (Patient taking differently: TAKE 80 mg in the evening)  . carboxymethylcellulose (REFRESH PLUS) 0.5 % SOLN 1 drop 3 (three) times daily as needed.  Marland Kitchen ELIQUIS 5 MG TABS tablet TAKE 1 TABLET(5 MG) BY MOUTH TWICE DAILY  . fenofibrate (TRICOR) 48 MG tablet Take 1 tablet (48 mg total) by mouth daily. (Patient taking differently: Take 48 mg by mouth at bedtime. )  . ferrous sulfate 325 (65 FE) MG tablet Take 1 tablet (325 mg total) by mouth daily with breakfast.  . GNP GARLIC EXTRACT  PO Take 1 tablet by mouth daily.   . hydrALAZINE (APRESOLINE) 50 MG tablet Take 1 tablet (50 mg total) by mouth 3 (three) times daily.  . Insulin Glargine  (LANTUS SOLOSTAR) 100 UNIT/ML Solostar Pen Inject 37 Units into the skin daily.   . Melatonin 10 MG CAPS Take 10 mg by mouth at bedtime as needed (sleep).   . metoprolol tartrate (LOPRESSOR) 25 MG tablet TAKE 1/2 TABLET BY MOUTH TWICE DAILY (Patient taking differently: TAKE 12.5 TABLET BY MOUTH TWICE DAILY)  . Multiple Vitamins-Minerals (MULTIVITAMINS THER. W/MINERALS) TABS Take 1 tablet by mouth daily.    Marland Kitchen omega-3 acid ethyl esters (LOVAZA) 1 g capsule Take 2 g by mouth 2 (two) times daily.   . pantoprazole (PROTONIX) 40 MG tablet TAKE 1 TABLET BY MOUTH DAILY (Patient taking differently: TAKE 40 mh TABLET BY MOUTH DAILY)  . PARoxetine (PAXIL) 20 MG tablet TAKE 1 TABLET BY MOUTH EVERY MORNING (Patient taking differently: TAKE 20 mg TABLET BY MOUTH EVERY MORNING)  . torsemide (DEMADEX) 100 MG tablet Take 100 mg by mouth daily.  . traMADol (ULTRAM) 50 MG tablet TAKE 1 TABLET BY MOUTH FOUR TIMES DAILY (Patient taking differently: TAKE 50 mg TABLET BY MOUTH FOUR TIMES DAILY)    FAMILY HISTORY:  Her indicated that her mother is deceased. She indicated that her father is deceased. She indicated that three of her five sisters are alive. She indicated that her brother is alive. She indicated that the status of her maternal grandmother is unknown. She indicated that both of her sons are alive.    SOCIAL HISTORY: She  reports that she has never smoked. She has never used smokeless tobacco. She reports that she does not drink alcohol or use drugs.  REVIEW OF SYSTEMS:   10 systems were reviewed with the patient.  Significant findings include a history of chronic renal insufficiency with creatinines as high as 5 in the past.  He does have a history of CVA with the only residual being diplopia.   SUBJECTIVE:  As Above  VITAL SIGNS: BP 126/67 (BP Location: Right Arm)   Pulse (!) 109   Temp 98.7 F (37.1 C) (Oral)   Resp 18   Ht 5\' 5"  (1.651 m)   Wt 172 lb 14.4 oz (78.4 kg)   SpO2 92%   BMI 28.77  kg/m   HEMODYNAMICS:    VENTILATOR SETTINGS: FiO2 (%):  [50 %-55 %] 50 %  INTAKE / OUTPUT: No intake/output data recorded.  PHYSICAL EXAMINATION: General: This is an elderly female appearing her stated age and in no distress despite wearing a oxygen mask. Cardiovascular: S1 and S2 are irregularly irregular without murmur rub or gallop.  The PMI is not displaced. Lungs: Decreased air movement throughout the left hemithorax.  Few scattered rhonchi on the right no wheezes. Abdomen: Abdomen is obese soft and nontender without any organomegaly.   No dependent edema.  LABS:  BMET  Recent Labs Lab 11/25/16 1503 11/26/16 0229  NA 136 136  K 3.6 3.9  CL 98* 98*  CO2 25 25  BUN 65* 72*  CREATININE 3.68* 4.13*  GLUCOSE 160* 141*    Electrolytes  Recent Labs Lab 11/25/16 1503 11/26/16 0229  CALCIUM 8.9 8.6*    CBC  Recent Labs Lab 11/25/16 1503 11/26/16 0229  WBC 26.5* 23.7*  HGB 10.0* 10.4*  HCT 30.5* 32.0*  PLT 270 254    Coag's No results for input(s): APTT, INR in the last 168 hours.  Sepsis  Markers  Recent Labs Lab 11/25/16 1542 11/25/16 1652  LATICACIDVEN 2.06* 1.79    ABG No results for input(s): PHART, PCO2ART, PO2ART in the last 168 hours.  Liver Enzymes  Recent Labs Lab 11/25/16 1503  AST 36  ALT 24  ALKPHOS 61  BILITOT 0.9  ALBUMIN 2.7*    Cardiac Enzymes  Recent Labs Lab 11/25/16 2045 11/26/16 0229  TROPONINI 0.83* 0.84*    Glucose  Recent Labs Lab 11/26/16 0747 11/26/16 1153  GLUCAP 150* 134*    Imaging Dg Chest 2 View  Result Date: 11/25/2016 CLINICAL DATA:  71 y/o  F; chest pain with cough and hemoptysis. EXAM: CHEST  2 VIEW COMPARISON:  04/07/2014 PET-CT.  01/10/2009 chest radiograph. FINDINGS: Mild cardiomegaly. Aortic atherosclerosis with calcification. Status post CABG. Median sternotomy wires are aligned. Extensive consolidations throughout the left mid and lower lung zones. Small to moderate left  effusion. Right upper quadrant cholecystectomy clips. Left humerus rotator cuff repair anchor. Multilevel degenerative changes of the spine. IMPRESSION: Extensive consolidation throughout left lung and small to moderate left effusion probably representing pneumonia. Follow-up to resolution is recommended to exclude underlying malignancy. Electronically Signed   By: Kristine Garbe M.D.   On: 11/25/2016 15:23   Ct Chest Wo Contrast  Result Date: 11/26/2016 CLINICAL DATA:  Patient with respiratory failure. EXAM: CT CHEST WITHOUT CONTRAST TECHNIQUE: Multidetector CT imaging of the chest was performed following the standard protocol without IV contrast. COMPARISON:  Chest radiograph 11/25/2016. FINDINGS: Cardiovascular: Heart is enlarged. No pericardial effusion. Coronary arterial vascular calcifications. Thoracic aortic vascular calcifications. Stable fluid density structure along the anterior right aspect of the mediastinum (image 75; series 3). Mediastinum/Nodes: Multiple prominent subcentimeter mediastinal lymph nodes are visualized. The esophagus is unremarkable. Abarrent right subclavian vein. Lungs/Pleura: Bowing of the posterior trachea secondary to expiratory phase imaging. There is a a moderate complex left pleural effusion which appears partially loculated. There is extensive consolidation throughout the left lung. Minimal aerated left upper lobe. Patchy ground-glass and consolidative opacities demonstrated throughout the right upper, right middle and right lower lobes. Trace right pleural fluid. No pneumothorax. Upper Abdomen: Status post cholecystectomy.  No acute process. Musculoskeletal: Thoracic spine degenerative changes. No aggressive or acute appearing osseous lesions. IMPRESSION: 1. There is extensive consolidation throughout the left lung which may be secondary to pneumonia in the appropriate clinical setting. There is a complex appearing loculated moderate left pleural effusion.  Recommend radiographic followup to ensure resolution. 2. Patchy ground-glass and consolidative opacities scattered throughout the right lung which may be secondary to an infectious or inflammatory process. Recommend attention on follow-up. 3. Multiple prominent subcentimeter superior mediastinal lymph nodes are nonspecific however likely reactive in etiology. Recommend attention on follow-up. 4. Cardiomegaly.  Coronary arterial atherosclerosis. 5. Aortic Atherosclerosis (ICD10-I70.0). Electronically Signed   By: Lovey Newcomer M.D.   On: 11/26/2016 14:52      ANTIBIOTICS: Levoquin   DISCUSSION: This is a 71 year old diabetic with a history of CLL and chronic renal insufficiency who presented with hemoptysis and dyspnea.  A CT scan of the chest today shows substantial consolidation of the left lung with a moderate to large left pleural effusion.  There is also some infiltrate in the right base.  ASSESSMENT / PLAN:  PULMONARY A:  hypoxia.  The appearance of the CT scan is troubling.  She is not particularly toxic appearing and I am not concerned that she is failing to respond to antibiotics despite her immunosuppressed state with the CLL.  I  am concerned that she may have an endobronchial lesion or plugging accounting for the appearance of the chest x-ray. I have held her Eliquis to allow diagnostic studies, anticipating first a thoracentesis and if a diagnosis is still not evident than bronchoscopy.  He does not appear to be particularly toxic so I have not broaden her antibiotic coverage.  Lactic acid has been sent as well as a blood gas and if either of those is grossly abnormal I will intervene more aggressively.    Lars Masson, MD Pulmonary and [place] Pager: [phone]  11/26/2016, 3:14 PM

## 2016-11-27 ENCOUNTER — Other Ambulatory Visit: Payer: Self-pay

## 2016-11-27 DIAGNOSIS — N179 Acute kidney failure, unspecified: Secondary | ICD-10-CM

## 2016-11-27 DIAGNOSIS — N185 Chronic kidney disease, stage 5: Secondary | ICD-10-CM

## 2016-11-27 DIAGNOSIS — N184 Chronic kidney disease, stage 4 (severe): Secondary | ICD-10-CM

## 2016-11-27 LAB — CBC
HEMATOCRIT: 29.1 % — AB (ref 36.0–46.0)
Hemoglobin: 9.6 g/dL — ABNORMAL LOW (ref 12.0–15.0)
MCH: 31 pg (ref 26.0–34.0)
MCHC: 33 g/dL (ref 30.0–36.0)
MCV: 93.9 fL (ref 78.0–100.0)
Platelets: 246 10*3/uL (ref 150–400)
RBC: 3.1 MIL/uL — AB (ref 3.87–5.11)
RDW: 15.3 % (ref 11.5–15.5)
WBC: 17.9 10*3/uL — AB (ref 4.0–10.5)

## 2016-11-27 LAB — GLUCOSE, CAPILLARY
GLUCOSE-CAPILLARY: 132 mg/dL — AB (ref 65–99)
Glucose-Capillary: 108 mg/dL — ABNORMAL HIGH (ref 65–99)
Glucose-Capillary: 108 mg/dL — ABNORMAL HIGH (ref 65–99)
Glucose-Capillary: 132 mg/dL — ABNORMAL HIGH (ref 65–99)

## 2016-11-27 LAB — BASIC METABOLIC PANEL
ANION GAP: 15 (ref 5–15)
BUN: 105 mg/dL — ABNORMAL HIGH (ref 6–20)
CALCIUM: 8.2 mg/dL — AB (ref 8.9–10.3)
CHLORIDE: 96 mmol/L — AB (ref 101–111)
CO2: 22 mmol/L (ref 22–32)
Creatinine, Ser: 5.9 mg/dL — ABNORMAL HIGH (ref 0.44–1.00)
GFR calc Af Amer: 8 mL/min — ABNORMAL LOW (ref >60)
GFR, EST NON AFRICAN AMERICAN: 6 mL/min — AB (ref >60)
GLUCOSE: 133 mg/dL — AB (ref 65–99)
POTASSIUM: 4.3 mmol/L (ref 3.5–5.1)
Sodium: 133 mmol/L — ABNORMAL LOW (ref 135–145)

## 2016-11-27 LAB — STREP PNEUMONIAE URINARY ANTIGEN: Strep Pneumo Urinary Antigen: POSITIVE — AB

## 2016-11-27 LAB — PROCALCITONIN: Procalcitonin: 16.71 ng/mL

## 2016-11-27 MED ORDER — HYDROCODONE-ACETAMINOPHEN 5-325 MG PO TABS: 1.0000 | ORAL_TABLET | Freq: Four times a day (QID) | ORAL | Status: DC | PRN

## 2016-11-27 MED ORDER — LEVOFLOXACIN IN D5W 500 MG/100ML IV SOLN
500.0000 mg | INTRAVENOUS | Status: DC
  Administered 2016-11-28 – 2016-12-02 (×3): 500 mg via INTRAVENOUS
  Filled 2016-11-27 (×4): qty 100

## 2016-11-27 MED ORDER — DM-GUAIFENESIN ER 30-600 MG PO TB12
1.0000 | ORAL_TABLET | Freq: Two times a day (BID) | ORAL | Status: DC
  Administered 2016-11-28 – 2016-12-07 (×18): 1 via ORAL
  Filled 2016-11-27 (×18): qty 1

## 2016-11-27 MED ORDER — BENZONATATE 100 MG PO CAPS
100.0000 mg | ORAL_CAPSULE | Freq: Three times a day (TID) | ORAL | Status: DC
  Administered 2016-11-28 – 2016-12-07 (×27): 100 mg via ORAL
  Filled 2016-11-27 (×27): qty 1

## 2016-11-27 MED ORDER — DEXTROSE 5 % IV SOLN
250.0000 mg | Freq: Three times a day (TID) | INTRAVENOUS | Status: DC
  Administered 2016-11-28 – 2016-12-02 (×13): 250 mg via INTRAVENOUS
  Filled 2016-11-27 (×26): qty 0.25

## 2016-11-27 NOTE — Progress Notes (Addendum)
Family Medicine Interim Progress Note:  1. Acute hypoxic respiratory failure 2/2 large left pleural effusion- patient remains on Venturi mask at 15L. She has normal work of breathing on exam but becomes short of breath with any movement. The plan was to get a thoracentesis today, but this has not happened. I spoke with Dr. Pearline Cables (CCM) over the phone, who will try to get someone to perform the thoracentesis tonight. Appreciate CCM's assistance. Will continue to monitor closely. Low threshold to transfer to stepdown.  2. AKI on CKD VI- Reviewed labs from this morning. Cr has increased from 4.13 > 5.90. Baseline 2.7-3.0. Patient has only at [place] today. She states she is drinking a lot, but hasn't been urinating like she normally does. Obtained bladder scan to rule out acute urinary retention and she had 540cc in her bladder. Will I/O cath. Patient will still likely need nephrology consult in the morning.  Hyman Bible, MD PGY-3

## 2016-11-27 NOTE — Plan of Care (Signed)
  Progressing Education: Knowledge of [facility] Education information/materials will improve 11/27/2016 2114 - Progressing by Jerene Bears, RN Safety: Ability to remain free from injury will improve 11/27/2016 2114 - Progressing by Jerene Bears, RN Health Behavior/Discharge Planning: Ability to manage health-related needs will improve 11/27/2016 2114 - Progressing by Jerene Bears, RN Pain Managment: General experience of comfort will improve 11/27/2016 2114 - Progressing by Jerene Bears, RN Physical Regulation: Ability to maintain clinical measurements within normal limits will improve 11/27/2016 2114 - Progressing by Jerene Bears, RN Will remain free from infection 11/27/2016 2114 - Progressing by Jerene Bears, RN Skin Integrity: Risk for impaired skin integrity will decrease 11/27/2016 2114 - Progressing by Jerene Bears, RN Tissue Perfusion: Risk factors for ineffective tissue perfusion will decrease 11/27/2016 2114 - Progressing by Jerene Bears, RN Activity: Risk for activity intolerance will decrease 11/27/2016 2114 - Progressing by Jerene Bears, RN Fluid Volume: Ability to maintain a balanced intake and output will improve 11/27/2016 2114 - Progressing by Jerene Bears, RN Nutrition: Adequate nutrition will be maintained 11/27/2016 2114 - Progressing by Jerene Bears, RN Bowel/Gastric: Will not experience complications related to bowel motility 11/27/2016 2114 - Progressing by Jerene Bears, RN

## 2016-11-27 NOTE — Progress Notes (Signed)
Notified MD of Pt's persistent left flank pain. Orders to give tramadol 1 hour early.

## 2016-11-27 NOTE — Progress Notes (Signed)
Pharmacy Antibiotic Note  Christine Santos is a 71 y.o. female admitted on 11/25/2016 with acute respiratory failure, tx'd as CAP, now to broaden ABX.  Pharmacy has been consulted for Levaquin and Azactam dosing.  Pt with acute on chronic kidney disease (baseline SCr ~3, now 5.9).  Plan: Rec'd Levaquin 750mg  on 11/2 and again on 11/3; will continue with Levaquin 500mg  IV Q48H. Azactam 250mg  IV every 8 hours.  Height: 5\' 5"  (165.1 cm) Weight: 172 lb 14.4 oz (78.4 kg) IBW/kg (Calculated) : 57  Temp (24hrs), Avg:98.4 F (36.9 C), Min:98.3 F (36.8 C), Max:98.6 F (37 C)  Recent Labs  Lab 11/25/16 1503 11/25/16 1542 11/25/16 1652 11/26/16 0229 11/26/16 1524 11/26/16 1820 11/27/16 0949  WBC 26.5*  --   --  23.7*  --   --  17.9*  CREATININE 3.68*  --   --  4.13*  --   --  5.90*  LATICACIDVEN  --  2.06* 1.79  --  1.0 1.1  --     Estimated Creatinine Clearance: 9.1 mL/min (A) (by C-G formula based on SCr of 5.9 mg/dL (H)).    Allergies  Allergen Reactions  . Other     Darvocet  . Penicillins   . Amoxicillin Rash    Has patient had a PCN reaction causing immediate rash, facial/tongue/throat swelling, SOB or lightheadedness with hypotension: No Has patient had a PCN reaction causing severe rash involving mucus membranes or skin necrosis: No Has patient had a PCN reaction that required hospitalization: No Has patient had a PCN reaction occurring within the last 10 years: No If all of the above answers are "NO", then may proceed with Cephalosporin use.   . Cephalexin Rash  . Codeine Phosphate Rash     Thank you for allowing pharmacy to be a part of this patient's care.  Wynona Neat, PharmD, BCPS  11/27/2016 11:38 PM

## 2016-11-27 NOTE — Progress Notes (Signed)
Family Medicine Teaching Service Daily Progress Note Intern Pager: [phone]  Patient name: Christine Santos Medical record number: [id number] Date of birth: 12-Dec-1945 Age: 71 y.o. Gender: female  Primary Care Provider: [name], MD Consultants: Pulmonology  Code Status: Full   Pt Overview and Major Events to Date:  11/2: Patient admitted for acute hypoxic respiratory failure, treatment started for PNA  11/3: Pulmonology consulted when CT scan chest showed substantial consolidation of left lung with a moderate to large left pleura effusion   Assessment and Plan: Christine Santos is a 71 y.o. female presenting with cough and dyspnea. PMH is significant for T2DM with peripheral neuropathy, CLL s/p chemotherapy, HTN, CKDVI, CAD s/p CABG, hx CVA in 2017, hx of afib on eliquis.   Acute hypoxic respiratory failure: New oxygen requirement. Suspect pneumonia given cough, WBC to 26.5 (BL < 10), and low grade fever. CXR with consolidations of left mid/lower lung fields. CT chest obtained and showed substantial consolidation of the left lung with a moderate to large left pleural effusion. Pulmonology concerned that she may have endobronchial lesion or plugging. Required Venturi mask overnight, although amount of O2 required has been weaned slightly.  - Levaquin 750 mg (11/2- )  - xopenox for elevated HRs  - hycodan 5 mL q6h prn - home tramadol, tylenol prn - supplemental O2 to maintain O2 sats >/= 92% - trend CBC; leukocytosis mildly improved to 23.7 yesterday; today's labs pending  -pulmonology consulted; appreciate recs  -anticipate thoracentesis with possible bronchoscopy if thoracentesis not diagnostic planned for today; Eliquis being held    H/o diastolic CHF w/ h/o CAD s/p CABG 1996.Weight stable and fluctuates only a 1-2 lbs at a time. Taking torsemide daily, alternating doses of 50 mg and 100 mg; metoprolol tartrate 12.5 mg BID; norvasc 10 mg daily; atorvastatin 80 mg daily. Last echo  2017 showed EF of 55-60% with G1DD. Troponin trend: 0.83>0.84>0.67.  - Continue home meds - I/Os - Daily weights   Afib:  HR now 90-100s.  - Continue eliquis -repeat EKG   T2DM CBGs stable. Last a1c 6.0 on 07/06/16, at home on lantus 37U daily with 5U humalog tid with meals. - monitor CBGs qACHS - SSI, lantus 15 U in a.m.  HTN: Normotensive.. At home on norvasc, metoprolol, torsemide - continue home meds  CKD stage 4 with anemia. AKI vs chronic worsening.At home on ferrous sulfate. On admit Hgb 10.0 with baseline ~10 and Cr 3.68 with last SCr 3.4 in 08/2016. Follows with [employer] Cr uptrended to 4.1.  - monitor CBC and BMP - avoid nephrotoxic medications -holding torsemide  -BMET from today pending   HLD at home on fenofibrate and atorvastatin.last lipid panel 09/04/15. LDL 99. Triglycerides 188. - continue home meds  GERDat home on protonix - continue home med  Goutat home on allopurinol  - continue home med  Anxiety at home on paxil 20 mg daily - continue home med  FEN/GI: heart healthy, carb modified diet; protonix Prophylaxis: eliquis  Disposition: Pending further work up by pulmonology   Subjective:  Patient reports feeling "fine". Still having coughing spells with associated SOB.   Objective: Temp:  [98.2 F (36.8 C)-98.7 F (37.1 C)] 98.4 F (36.9 C) (11/04 0539) Pulse Rate:  [100-109] 100 (11/04 0539) Resp:  [18-24] 20 (11/04 0539) BP: (115-145)/(48-88) 134/88 (11/04 0539) SpO2:  [91 %-95 %] 95 % (11/04 0539) FiO2 (%):  [50 %] 50 % (11/04 0539) Physical Exam: General: elderly female lying in bed,  venturi mask in place  Cardiovascular: Tachycardic irregularly irregular. No murmur appreciated.  Respiratory: Decreased breath sounds at baselines bilaterally with crackles L >R. Comfortable WOB.  Abdomen: soft, NTND  Extremities: No LE edema. Moves all extremities spontaneously.   Laboratory: Recent Labs  Lab 11/25/16 1503  11/26/16 0229  WBC 26.5* 23.7*  HGB 10.0* 10.4*  HCT 30.5* 32.0*  PLT 270 254   Recent Labs  Lab 11/25/16 1503 11/26/16 0229  NA 136 136  K 3.6 3.9  CL 98* 98*  CO2 25 25  BUN 65* 72*  CREATININE 3.68* 4.13*  CALCIUM 8.9 8.6*  PROT 5.8*  --   BILITOT 0.9  --   ALKPHOS 61  --   ALT 24  --   AST 36  --   GLUCOSE 160* 141*     Imaging/Diagnostic Tests: Dg Chest 2 View  Result Date: 11/25/2016 CLINICAL DATA:  71 y/o  F; chest pain with cough and hemoptysis. EXAM: CHEST  2 VIEW COMPARISON:  04/07/2014 PET-CT.  01/10/2009 chest radiograph. FINDINGS: Mild cardiomegaly. Aortic atherosclerosis with calcification. Status post CABG. Median sternotomy wires are aligned. Extensive consolidations throughout the left mid and lower lung zones. Small to moderate left effusion. Right upper quadrant cholecystectomy clips. Left humerus rotator cuff repair anchor. Multilevel degenerative changes of the spine. IMPRESSION: Extensive consolidation throughout left lung and small to moderate left effusion probably representing pneumonia. Follow-up to resolution is recommended to exclude underlying malignancy. Electronically Signed   By: Kristine Garbe M.D.   On: 11/25/2016 15:23   Ct Chest Wo Contrast  Result Date: 11/26/2016 CLINICAL DATA:  Patient with respiratory failure. EXAM: CT CHEST WITHOUT CONTRAST TECHNIQUE: Multidetector CT imaging of the chest was performed following the standard protocol without IV contrast. COMPARISON:  Chest radiograph 11/25/2016. FINDINGS: Cardiovascular: Heart is enlarged. No pericardial effusion. Coronary arterial vascular calcifications. Thoracic aortic vascular calcifications. Stable fluid density structure along the anterior right aspect of the mediastinum (image 75; series 3). Mediastinum/Nodes: Multiple prominent subcentimeter mediastinal lymph nodes are visualized. The esophagus is unremarkable. Abarrent right subclavian vein. Lungs/Pleura: Bowing of the  posterior trachea secondary to expiratory phase imaging. There is a a moderate complex left pleural effusion which appears partially loculated. There is extensive consolidation throughout the left lung. Minimal aerated left upper lobe. Patchy ground-glass and consolidative opacities demonstrated throughout the right upper, right middle and right lower lobes. Trace right pleural fluid. No pneumothorax. Upper Abdomen: Status post cholecystectomy.  No acute process. Musculoskeletal: Thoracic spine degenerative changes. No aggressive or acute appearing osseous lesions. IMPRESSION: 1. There is extensive consolidation throughout the left lung which may be secondary to pneumonia in the appropriate clinical setting. There is a complex appearing loculated moderate left pleural effusion. Recommend radiographic followup to ensure resolution. 2. Patchy ground-glass and consolidative opacities scattered throughout the right lung which may be secondary to an infectious or inflammatory process. Recommend attention on follow-up. 3. Multiple prominent subcentimeter superior mediastinal lymph nodes are nonspecific however likely reactive in etiology. Recommend attention on follow-up. 4. Cardiomegaly.  Coronary arterial atherosclerosis. 5. Aortic Atherosclerosis (ICD10-I70.0). Electronically Signed   By: Lovey Newcomer M.D.   On: 11/26/2016 14:52    Nicolette Bang, DO 11/27/2016, 6:46 AM PGY-3, [facility] Intern pager: [phone], text pages welcome

## 2016-11-27 NOTE — Progress Notes (Signed)
Bladder scan showed 546 in bladder MD notified and verbal order given to in and out cath pt. In and out catheter performed with 600 output.

## 2016-11-27 NOTE — Progress Notes (Signed)
Pt instructed on use of Flutter, pt understands and is able to use appropriately

## 2016-11-27 NOTE — Progress Notes (Signed)
PULMONARY / CRITICAL CARE MEDICINE   Name: Christine Santos MRN: [id number] DOB: September 29, 1945    ADMISSION DATE:  11/25/2016  HISTORY OF PRESENT ILLNESS:   41yoF with hx CLL, CHF, CKD Stage V, Afib (on eliquis), CVA, CAD, who was admitted 11/25/16 after 2 days of SOB, Productive cough, and small volume hemoptysis at home. My partner Dr Pearline Cables saw her in consult 11/3. I am following up on her now as she wasn't seen today by the day team. On my interview of her, she says she still has SOB that is worse with exertion but is overall improved compared to yesterday. She says her sputum initially was yellow/brown but now is "foamy." She admits to wheezing and pain with coughing. Denies F/C. She was treated with Levoflox 11/2-11/3 but was stopped today for unclear reasons. She had been on Eliquis (last dose 11/3 AM) which is now being held. Chest CT showed diffuse dense infiltrates within left lung with loculated appearing moderate sized left pleural effusion. Discussed these results with her. Discussed the need for thoracentesis but that we want her to be off the Eliquis a little longer first. She is agreeable to waiting until tomorrow given her clinical improvement.    PAST MEDICAL HISTORY :  She  has a past medical history of Anemia (08/29/2011), Anxiety, Arthritis, CAD (coronary artery disease), Cataract, Chronic kidney disease, CLL (chronic lymphoblastic leukemia), Diastolic heart failure ([place]), GERD (gastroesophageal reflux disease), H/O hiatal hernia, Heart murmur, Hyperlipidemia, Hypertension, IDDM (insulin dependent diabetes mellitus) ([place]), PAD (peripheral artery disease) ([place]), Paroxysmal atrial fibrillation ([place]), Peripheral vascular disease ([place]), and Stroke (cerebrum) ([place]).  PAST SURGICAL HISTORY: She  has a past surgical history that includes Cataract extraction w/ intraocular lens  implant, bilateral (Bilateral, ?2011); Excision heel spur excision (Bilateral, 1970's); Cholecystectomy (1993); Tubal  ligation (1980's); Coronary artery bypass graft (10/1994); Shoulder open rotator cuff repair (Bilateral, 1990's); Angioplasty / stenting iliac; Angioplasty / stenting femoral (Right, 09/2010); Angioplasty / stenting femoral (Left, 11/2010); Transluminal atherectomy femoral artery (Right, 01/22/2013); Loop Recorder Insertion (N/A, 09/11/2015); LOWER EXTREMITY ANGIOGRAM (N/A, 01/22/2013); ATHERECTOMY PERIPHERAL ARTERY (Right, 01/22/2013); and PTA FEMORAL POPLITEAL ARTERY (Right, 01/22/2013).  Allergies  Allergen Reactions  . Other     Darvocet  . Penicillins   . Amoxicillin Rash    Has patient had a PCN reaction causing immediate rash, facial/tongue/throat swelling, SOB or lightheadedness with hypotension: No Has patient had a PCN reaction causing severe rash involving mucus membranes or skin necrosis: No Has patient had a PCN reaction that required hospitalization: No Has patient had a PCN reaction occurring within the last 10 years: No If all of the above answers are "NO", then may proceed with Cephalosporin use.   . Cephalexin Rash  . Codeine Phosphate Rash    No current facility-administered medications on file prior to encounter.    Current Outpatient Medications on File Prior to Encounter  Medication Sig  . allopurinol (ZYLOPRIM) 100 MG tablet TAKE 1 TABLET(100 MG) BY MOUTH DAILY  . amLODipine (NORVASC) 10 MG tablet TAKE 1 TABLET(10 MG) BY MOUTH DAILY  . Ascorbic Acid (VITAMIN C) 100 MG tablet Take 100 mg by mouth daily.  Marland Kitchen atorvastatin (LIPITOR) 80 MG tablet TAKE 1 TABLET BY MOUTH DAILY (Patient taking differently: TAKE 80 mg in the evening)  . carboxymethylcellulose (REFRESH PLUS) 0.5 % SOLN 1 drop 3 (three) times daily as needed.  Marland Kitchen ELIQUIS 5 MG TABS tablet TAKE 1 TABLET(5 MG) BY MOUTH TWICE DAILY  . fenofibrate (TRICOR) 48 MG  tablet Take 1 tablet (48 mg total) by mouth daily. (Patient taking differently: Take 48 mg by mouth at bedtime. )  . ferrous sulfate 325 (65 FE) MG tablet  Take 1 tablet (325 mg total) by mouth daily with breakfast.  . GNP GARLIC EXTRACT PO Take 1 tablet by mouth daily.   Marland Kitchen HUMALOG KWIKPEN 100 UNIT/ML KiwkPen Inject 5 Units as directed 3 (three) times daily.  . hydrALAZINE (APRESOLINE) 50 MG tablet Take 1 tablet (50 mg total) by mouth 3 (three) times daily.  . Insulin Glargine (LANTUS SOLOSTAR) 100 UNIT/ML Solostar Pen Inject 37 Units into the skin daily.   . Melatonin 10 MG CAPS Take 10 mg by mouth at bedtime as needed (sleep).   . metoprolol tartrate (LOPRESSOR) 25 MG tablet TAKE 1/2 TABLET BY MOUTH TWICE DAILY (Patient taking differently: TAKE 12.5 TABLET BY MOUTH TWICE DAILY)  . Multiple Vitamins-Minerals (MULTIVITAMINS THER. W/MINERALS) TABS Take 1 tablet by mouth daily.    Marland Kitchen omega-3 acid ethyl esters (LOVAZA) 1 g capsule Take 2 g by mouth 2 (two) times daily.   . pantoprazole (PROTONIX) 40 MG tablet TAKE 1 TABLET BY MOUTH DAILY (Patient taking differently: TAKE 40 mh TABLET BY MOUTH DAILY)  . PARoxetine (PAXIL) 20 MG tablet TAKE 1 TABLET BY MOUTH EVERY MORNING (Patient taking differently: TAKE 20 mg TABLET BY MOUTH EVERY MORNING)  . torsemide (DEMADEX) 100 MG tablet Take 100 mg by mouth daily.  . traMADol (ULTRAM) 50 MG tablet TAKE 1 TABLET BY MOUTH FOUR TIMES DAILY (Patient taking differently: TAKE 50 mg TABLET BY MOUTH FOUR TIMES DAILY)   FAMILY HISTORY:  Her indicated that her mother is deceased. She indicated that her father is deceased. She indicated that three of her five sisters are alive. She indicated that her brother is alive. She indicated that the status of her maternal grandmother is unknown. She indicated that both of her sons are alive.  SOCIAL HISTORY: She  reports that  has never smoked. she has never used smokeless tobacco. She reports that she does not drink alcohol or use drugs.  REVIEW OF SYSTEMS:   Review of Systems  Constitutional: Negative.   HENT: Negative.   Eyes: Negative.   Respiratory: Positive for cough,  sputum production, shortness of breath and wheezing.   Cardiovascular: Positive for chest pain.  Gastrointestinal: Negative.   Genitourinary: Negative.   Musculoskeletal: Negative.   Skin: Negative.   Neurological: Negative.   Endo/Heme/Allergies: Negative.   Psychiatric/Behavioral: Negative.    VITAL SIGNS: BP (!) 153/62 (BP Location: Right Arm)   Pulse 88   Temp 98.3 F (36.8 C) (Oral)   Resp 20   Ht 5\' 5"  (1.651 m)   Wt 78.4 kg (172 lb 14.4 oz)   SpO2 95%   BMI 28.77 kg/m   INTAKE / OUTPUT: I/O last 3 completed shifts: In: 150 [IV Piggyback:150] Out: 600 [Urine:600]  PHYSICAL EXAMINATION: General: Elderly female, lying in bed on 15L O2 via ventimask, in NAD Neuro: AAOx3, moving all extremities HEENT: OP clear, MM moist Cardiovascular: Irreg irreg, rate 106-112 Lungs: CTA b/l, wet-sounding cough, was able to expectorate some while I was present in room, Speaking in full sentences with no accessory muscle use. RR 16-20 on my exam.  Abdomen: Soft NTND, BS+ Musculoskeletal: no LE edema Skin: no rashes   LABS:  BMET Recent Labs  Lab 11/25/16 1503 11/26/16 0229 11/27/16 0949  NA 136 136 133*  K 3.6 3.9 4.3  CL 98* 98* 96*  CO2 25 25 22   BUN 65* 72* 105*  CREATININE 3.68* 4.13* 5.90*  GLUCOSE 160* 141* 133*   Electrolytes Recent Labs  Lab 11/25/16 1503 11/26/16 0229 11/27/16 0949  CALCIUM 8.9 8.6* 8.2*   CBC Recent Labs  Lab 11/25/16 1503 11/26/16 0229 11/27/16 0949  WBC 26.5* 23.7* 17.9*  HGB 10.0* 10.4* 9.6*  HCT 30.5* 32.0* 29.1*  PLT 270 254 246   Coag's No results for input(s): APTT, INR in the last 168 hours.  Sepsis Markers Recent Labs  Lab 11/25/16 1652 11/26/16 1524 11/26/16 1820  LATICACIDVEN 1.79 1.0 1.1   ABG Recent Labs  Lab 11/26/16 1552  PHART 7.427  PCO2ART 39.3  PO2ART 60.6*   Liver Enzymes Recent Labs  Lab 11/25/16 1503  AST 36  ALT 24  ALKPHOS 61  BILITOT 0.9  ALBUMIN 2.7*   Cardiac Enzymes Recent  Labs  Lab 11/25/16 2045 11/26/16 0229 11/26/16 1409  TROPONINI 0.83* 0.84* 0.67*   Glucose Recent Labs  Lab 11/26/16 1153 11/26/16 1651 11/27/16 0752 11/27/16 1312 11/27/16 1700 11/27/16 2107  GLUCAP 134* 157* 108* 108* 132* 132*   STUDIES:  Chest CT (11/26/16):  1. There is extensive consolidation throughout the left lung which may be secondary to pneumonia in the appropriate clinical setting. There is a complex appearing loculated moderate left pleural effusion. Recommend radiographic followup to ensure resolution. 2. Patchy ground-glass and consolidative opacities scattered throughout the right lung which may be secondary to an infectious or inflammatory process. Recommend attention on follow-up. 3. Multiple prominent subcentimeter superior mediastinal lymph nodes are nonspecific however likely reactive in etiology. Recommend attention on follow-up. 4. Cardiomegaly.  Coronary arterial atherosclerosis. 5. Aortic Atherosclerosis (ICD10-I70.0).  CULTURES: Blood cultures (11/3): no growth  ANTIBIOTICS: Levofloxacin 11/2>>11/3  LINES/TUBES: PIV's  ASSESSMENT / PLAN: 56yoF with hx CLL, CHF, CKD Stage V, Afib (on eliquis), CVA, CAD, who was admitted 11/25/16 after 2 days of SOB, Productive cough, and small volume hemoptysis, found to have Dense Left-sided Pneumonia with Loculated parapneumonic effusion (presumably empyema given its appearance), and Acute Hypoxic Respiratory failure requiring 15L O2.   PULMONARY 1. Pneumonia with Loculated Left-sided Parapneumonic effusion; Acute Hypoxic respiratory failure: - presume this effusion will be consistent with empyema. She needs a thoracentesis but has only been off eliquis for 36 hours as of now. Would like to wait until AM for full 48hrs off if she can clinically tolerate that.  - repeat CXR in AM - Continue 15L O2 and wean as tolerated.  - Not clear why her antibiotics were stopped yesterday. Will check a procalcitonin, obtain  sputum culture, and start Aztreonam and Levoflox. - Check MRSA nares; if positive, will add Vanc as well.  - start Guaifenesin/Dextromethorphan BID; Change tessalon perrles from 200mg  BID PRN (hasn't been receiving it) to 100mg  PO TID scheduled. - stop the hycodan; will add low dose hydrocodone/apap PRN pain not responsive to tramadol - start IS and Flutter valve.  - NPO p MN  - will transfer to stepdown unit for closer monitoring given her degree of hypoxia.   CARDIOVASCULAR 1. Afib: - holding Elquis (last dose 11/3 AM) - continue metop 12.5mg  PO BID  2. Diastolic CHF: - TTE on 05/03/79 showed grade 1 diastolic dysfunction  RENAL 1. AKI-on-CKD Stage V: baseline GFR hovers around 15-20, right at border of stage IV to stage V, now with worsening of renal function with BUN up to 105 and Creatinine up to 5.90. This may be because she received torsemide on 11/3  and 11/4. Although she has a history of CHF, she is NOT fluid overloaded at this time. Will hold off on any further diuresis. May actually need some gentle IVF. Will monitor UOP closely.   GASTROINTESTINAL NPO p MN  HEMATOLOGIC 1. Hx CLL  INFECTIOUS 1. Sepsis: - due to pneumonia and effusion as stated above - start trending procal; lactate normal this AM. Hemodynamically stable.   ENDOCRINE No active issues   NEUROLOGIC No active issues    60 minutes spent on this consult   Vernie Murders, MD [facility] [place] Pager: [phone]  11/27/2016, 11:37 PM

## 2016-11-27 NOTE — Progress Notes (Signed)
MD notified that Pt has not voided in 6 hrs. Bladder scan shows 93 ml. No new orders at this time. Will continue to monitor.

## 2016-11-28 ENCOUNTER — Inpatient Hospital Stay (HOSPITAL_COMMUNITY): Payer: Medicare Other

## 2016-11-28 DIAGNOSIS — J90 Pleural effusion, not elsewhere classified: Secondary | ICD-10-CM

## 2016-11-28 DIAGNOSIS — J153 Pneumonia due to streptococcus, group B: Secondary | ICD-10-CM

## 2016-11-28 DIAGNOSIS — J869 Pyothorax without fistula: Secondary | ICD-10-CM

## 2016-11-28 LAB — CBC WITH DIFFERENTIAL/PLATELET
BASOS ABS: 0 10*3/uL (ref 0.0–0.1)
Basophils Relative: 0 %
EOS ABS: 0 10*3/uL (ref 0.0–0.7)
Eosinophils Relative: 0 %
HCT: 27.1 % — ABNORMAL LOW (ref 36.0–46.0)
HEMOGLOBIN: 9.2 g/dL — AB (ref 12.0–15.0)
LYMPHS ABS: 1 10*3/uL (ref 0.7–4.0)
Lymphocytes Relative: 6 %
MCH: 31 pg (ref 26.0–34.0)
MCHC: 33.9 g/dL (ref 30.0–36.0)
MCV: 91.2 fL (ref 78.0–100.0)
Monocytes Absolute: 1.4 10*3/uL — ABNORMAL HIGH (ref 0.1–1.0)
Monocytes Relative: 8 %
NEUTROS PCT: 86 %
Neutro Abs: 15.2 10*3/uL — ABNORMAL HIGH (ref 1.7–7.7)
Platelets: 251 10*3/uL (ref 150–400)
RBC: 2.97 MIL/uL — AB (ref 3.87–5.11)
RDW: 15.1 % (ref 11.5–15.5)
WBC: 17.6 10*3/uL — AB (ref 4.0–10.5)

## 2016-11-28 LAB — COMPREHENSIVE METABOLIC PANEL
ALT: 37 U/L (ref 14–54)
AST: 52 U/L — ABNORMAL HIGH (ref 15–41)
Albumin: 2.2 g/dL — ABNORMAL LOW (ref 3.5–5.0)
Alkaline Phosphatase: 71 U/L (ref 38–126)
Anion gap: 18 — ABNORMAL HIGH (ref 5–15)
BUN: 130 mg/dL — ABNORMAL HIGH (ref 6–20)
CO2: 21 mmol/L — ABNORMAL LOW (ref 22–32)
Calcium: 8.3 mg/dL — ABNORMAL LOW (ref 8.9–10.3)
Chloride: 93 mmol/L — ABNORMAL LOW (ref 101–111)
Creatinine, Ser: 6.6 mg/dL — ABNORMAL HIGH (ref 0.44–1.00)
GFR calc Af Amer: 7 mL/min — ABNORMAL LOW (ref >60)
GFR calc non Af Amer: 6 mL/min — ABNORMAL LOW (ref >60)
Glucose, Bld: 200 mg/dL — ABNORMAL HIGH (ref 65–99)
Potassium: 4.2 mmol/L (ref 3.5–5.1)
Sodium: 132 mmol/L — ABNORMAL LOW (ref 135–145)
Total Bilirubin: 0.9 mg/dL (ref 0.3–1.2)
Total Protein: 5.7 g/dL — ABNORMAL LOW (ref 6.5–8.1)

## 2016-11-28 LAB — POCT I-STAT 3, ART BLOOD GAS (G3+)
ACID-BASE DEFICIT: 3 mmol/L — AB (ref 0.0–2.0)
Bicarbonate: 21.7 mmol/L (ref 20.0–28.0)
O2 SAT: 92 %
PH ART: 7.393 (ref 7.350–7.450)
PO2 ART: 64 mmHg — AB (ref 83.0–108.0)
Patient temperature: 98.6
TCO2: 23 mmol/L (ref 22–32)
pCO2 arterial: 35.7 mmHg (ref 32.0–48.0)

## 2016-11-28 LAB — MRSA PCR SCREENING: MRSA BY PCR: NEGATIVE

## 2016-11-28 LAB — GLUCOSE, CAPILLARY
GLUCOSE-CAPILLARY: 136 mg/dL — AB (ref 65–99)
GLUCOSE-CAPILLARY: 125 mg/dL — AB (ref 65–99)
Glucose-Capillary: 131 mg/dL — ABNORMAL HIGH (ref 65–99)
Glucose-Capillary: 141 mg/dL — ABNORMAL HIGH (ref 65–99)
Glucose-Capillary: 196 mg/dL — ABNORMAL HIGH (ref 65–99)

## 2016-11-28 LAB — CBC
HCT: 27.8 % — ABNORMAL LOW (ref 36.0–46.0)
Hemoglobin: 9.6 g/dL — ABNORMAL LOW (ref 12.0–15.0)
MCH: 31.4 pg (ref 26.0–34.0)
MCHC: 34.5 g/dL (ref 30.0–36.0)
MCV: 90.8 fL (ref 78.0–100.0)
Platelets: 261 10*3/uL (ref 150–400)
RBC: 3.06 MIL/uL — ABNORMAL LOW (ref 3.87–5.11)
RDW: 14.9 % (ref 11.5–15.5)
WBC: 16.2 10*3/uL — ABNORMAL HIGH (ref 4.0–10.5)

## 2016-11-28 LAB — RENAL FUNCTION PANEL
ANION GAP: 17 — AB (ref 5–15)
Albumin: 2.2 g/dL — ABNORMAL LOW (ref 3.5–5.0)
BUN: 120 mg/dL — ABNORMAL HIGH (ref 6–20)
CALCIUM: 8.2 mg/dL — AB (ref 8.9–10.3)
CO2: 21 mmol/L — AB (ref 22–32)
Chloride: 94 mmol/L — ABNORMAL LOW (ref 101–111)
Creatinine, Ser: 6.2 mg/dL — ABNORMAL HIGH (ref 0.44–1.00)
GFR calc non Af Amer: 6 mL/min — ABNORMAL LOW (ref >60)
GFR, EST AFRICAN AMERICAN: 7 mL/min — AB (ref >60)
GLUCOSE: 135 mg/dL — AB (ref 65–99)
POTASSIUM: 4.2 mmol/L (ref 3.5–5.1)
Phosphorus: 6.2 mg/dL — ABNORMAL HIGH (ref 2.5–4.6)
SODIUM: 132 mmol/L — AB (ref 135–145)

## 2016-11-28 LAB — PROTEIN, PLEURAL OR PERITONEAL FLUID

## 2016-11-28 LAB — PROTIME-INR
INR: 1.33
Prothrombin Time: 16.4 seconds — ABNORMAL HIGH (ref 11.4–15.2)

## 2016-11-28 LAB — BODY FLUID CELL COUNT WITH DIFFERENTIAL
Eos, Fluid: 0 %
LYMPHS FL: 0 %
MONOCYTE-MACROPHAGE-SEROUS FLUID: 6 % — AB (ref 50–90)
NEUTROPHIL FLUID: 94 % — AB (ref 0–25)
Total Nucleated Cell Count, Fluid: 17000 cu mm — ABNORMAL HIGH (ref 0–1000)

## 2016-11-28 LAB — LACTATE DEHYDROGENASE, PLEURAL OR PERITONEAL FLUID: LD FL: 2101 U/L — AB (ref 3–23)

## 2016-11-28 LAB — HEPARIN LEVEL (UNFRACTIONATED): HEPARIN UNFRACTIONATED: 1.16 [IU]/mL — AB (ref 0.30–0.70)

## 2016-11-28 LAB — PROTEIN, TOTAL: Total Protein: 5.9 g/dL — ABNORMAL LOW (ref 6.5–8.1)

## 2016-11-28 LAB — MAGNESIUM: Magnesium: 1.8 mg/dL (ref 1.7–2.4)

## 2016-11-28 LAB — APTT
APTT: 43 s — AB (ref 24–36)
APTT: 134 s — AB (ref 24–36)

## 2016-11-28 LAB — LACTATE DEHYDROGENASE: LDH: 198 U/L — ABNORMAL HIGH (ref 98–192)

## 2016-11-28 LAB — LACTIC ACID, PLASMA: LACTIC ACID, VENOUS: 0.8 mmol/L (ref 0.5–1.9)

## 2016-11-28 LAB — ABO/RH: ABO/RH(D): O POS

## 2016-11-28 LAB — PREPARE RBC (CROSSMATCH)

## 2016-11-28 LAB — PROCALCITONIN: Procalcitonin: 14.23 ng/mL

## 2016-11-28 MED ORDER — HEPARIN (PORCINE) IN NACL 100-0.45 UNIT/ML-% IJ SOLN
1000.0000 [IU]/h | INTRAMUSCULAR | Status: DC
  Administered 2016-11-28: 1000 [IU]/h via INTRAVENOUS
  Filled 2016-11-28: qty 250

## 2016-11-28 MED ORDER — MORPHINE SULFATE (PF) 2 MG/ML IV SOLN
1.0000 mg | Freq: Once | INTRAVENOUS | Status: AC
  Administered 2016-11-28: 1 mg via INTRAVENOUS
  Filled 2016-11-28: qty 1

## 2016-11-28 MED ORDER — METOPROLOL TARTRATE 5 MG/5ML IV SOLN
2.5000 mg | Freq: Once | INTRAVENOUS | Status: AC
  Administered 2016-11-28: 2.5 mg via INTRAVENOUS
  Filled 2016-11-28: qty 5

## 2016-11-28 MED ORDER — HEPARIN (PORCINE) IN NACL 100-0.45 UNIT/ML-% IJ SOLN
850.0000 [IU]/h | INTRAMUSCULAR | Status: DC
  Administered 2016-11-28: 1000 [IU]/h via INTRAVENOUS

## 2016-11-28 MED ORDER — PAROXETINE HCL 20 MG PO TABS: 10.0000 mg | ORAL_TABLET | Freq: Every morning | ORAL | Status: DC

## 2016-11-28 MED ORDER — METOCLOPRAMIDE HCL 5 MG/ML IJ SOLN
5.0000 mg | Freq: Four times a day (QID) | INTRAMUSCULAR | Status: DC
  Administered 2016-11-28 – 2016-11-30 (×6): 5 mg via INTRAVENOUS
  Filled 2016-11-28 (×7): qty 2

## 2016-11-28 MED ORDER — SODIUM CHLORIDE 0.9 % IV SOLN
INTRAVENOUS | Status: DC
  Administered 2016-11-28 – 2016-11-29 (×3): via INTRAVENOUS

## 2016-11-28 MED ORDER — METOPROLOL TARTRATE 25 MG PO TABS
25.0000 mg | ORAL_TABLET | Freq: Two times a day (BID) | ORAL | Status: DC
  Administered 2016-11-28 – 2016-11-29 (×2): 25 mg via ORAL
  Filled 2016-11-28 (×2): qty 1

## 2016-11-28 NOTE — Progress Notes (Signed)
Subjective: Patient examined, chest x-ray and CT scan of chest images personally reviewed and counseled with patient  71 year old admitted with respiratory distress and a large loculated pleural effusion.  This was associated with leukocytosis.  No significant pleuritic pain.  She has a large loculated pleural effusion with compressive atelectasis of the left lower lobe.  An attempt at thoracentesis was performed by Dr. Elsworth Soho who returned 2 ounces of fluid, cloudy with high white count.  Post procedure chest x-ray showed little difference in the large left effusion.  The patient is allergic to penicillins and has been treated with Azactam and Levaquin Dr. Elsworth Soho has recommended consideration for VATS to drain the loculated effusion-empyema  Patient has chronic atrial fibrillation but has been off her Eliquis for 2-1/2 days.  The patient had multivessel CABG in 1996.  Last year a echocardiogram showed EF of 50-55%.  The patient has a chronic renal failure and during this admission with pneumonia-empyema her creatinine has increased to 6.2.  She remains oliguric and is followed by nephrology.  Objective: Vital signs in last 24 hours: Temp:  [97.1 F (36.2 C)-98.6 F (37 C)] 97.4 F (36.3 C) (11/05 1631) Pulse Rate:  [53-134] 113 (11/05 2000) Cardiac Rhythm: Atrial fibrillation (11/05 1930) Resp:  [16-30] 28 (11/05 2000) BP: (111-152)/(53-117) 144/117 (11/05 2000) SpO2:  [89 %-98 %] 96 % (11/05 2000) FiO2 (%):  [50 %] 50 % (11/05 0250) Weight:  [175 lb 11.3 oz (79.7 kg)] 175 lb 11.3 oz (79.7 kg) (11/05 0250)  Hem atrial fibrillation, afebrile Intake/Output from previous day: 11/04 0701 - 11/05 0700 In: -  Out: 600 [Urine:600] Intake/Output this shift: Total I/O In: 85 [I.V.:85] Out: 75 [Urine:75]       Exam    General- alert and comfortable   Lungs-decreased breath sounds at the left base but without rales, wheezes   Cor-irregular heart rhythm-A. fib, no murmur , gallop  Abdomen- soft, non-tender   Extremities - warm, non-tender, minimal edema   Neuro- oriented, appropriate, no focal weakness   Lab Results: Recent Labs    11/27/16 0949 11/28/16 0427  WBC 17.9* 17.6*  HGB 9.6* 9.2*  HCT 29.1* 27.1*  PLT 246 251   BMET:  Recent Labs    11/27/16 0949 11/28/16 0427  NA 133* 132*  K 4.3 4.2  CL 96* 94*  CO2 22 21*  GLUCOSE 133* 135*  BUN 105* 120*  CREATININE 5.90* 6.20*  CALCIUM 8.2* 8.2*    PT/INR: No results for input(s): LABPROT, INR in the last 72 hours. ABG    Component Value Date/Time   PHART 7.427 11/26/2016 1552   HCO3 25.4 11/26/2016 1552   TCO2 24 10/14/2010 0827   ACIDBASEDEF 3.0 (H) 10/14/2010 0827   O2SAT 90.3 11/26/2016 1552   CBG (last 3)  Recent Labs    11/28/16 0759 11/28/16 1246 11/28/16 1634  GLUCAP 136* 141* 125*    Assessment/Plan: S/P  Left empyema with left lower lobe pneumonia Acute on chronic renal insufficiency Chronic atrial fibrillation Diabetes   The patient would benefit from left VATS for drainage of the empyema. The procedure will be at increased risk because of her comorbid medical problems as noted above. However without surgery the empyema would only progress and surgery is her best long-term therapy.  I discussed the procedure of left VATS for drainage of empyema with the patient and her family and she understands and agrees to proceed.  Procedure will be scheduled for November 6 afternoon.   LOS:  3 days    Tharon Aquas Trigt III 11/28/2016

## 2016-11-28 NOTE — Care Management Note (Addendum)
Case Management Note  Patient Details  Name: Christine Santos MRN: [id number] Date of Birth: February 11, 1945  Subjective/Objective:   From home with friend , Abe People, pta indep, he will be there 24/7 to help assist her at dc, admitted with SOB, Productive cough, and small volume hemoptysis at home.  Has loculated pl effusion.,sp thoracentesis today.  She has PCP and medication coverage.   11/8 [place], BSN - per pt eval rec SNF, if she progresses rapidly she may be able to return home.                      Action/Plan: NCM will follow for dc needs.   Expected Discharge Date:  11/27/16               Expected Discharge Plan:  [place]  In-House Referral:     Discharge planning Services  CM Consult  Post Acute Care Choice:    Choice offered to:     DME Arranged:    DME Agency:     HH Arranged:    [place] Agency:     Status of Service:  In process, will continue to follow  If discussed at Long Length of Stay Meetings, dates discussed:    Additional Comments:  Zenon Mayo, RN 11/28/2016, 11:28 AM

## 2016-11-28 NOTE — Progress Notes (Signed)
ANTICOAGULATION CONSULT NOTE - Initial Consult  Pharmacy Consult for heparin Indication: atrial fibrillation  Allergies  Allergen Reactions  . Other     Darvocet  . Penicillins   . Amoxicillin Rash    Has patient had a PCN reaction causing immediate rash, facial/tongue/throat swelling, SOB or lightheadedness with hypotension: No Has patient had a PCN reaction causing severe rash involving mucus membranes or skin necrosis: No Has patient had a PCN reaction that required hospitalization: No Has patient had a PCN reaction occurring within the last 10 years: No If all of the above answers are "NO", then may proceed with Cephalosporin use.   . Cephalexin Rash  . Codeine Phosphate Rash    Patient Measurements: Height: 5' 5.5" (166.4 cm) Weight: 175 lb 11.3 oz (79.7 kg) IBW/kg (Calculated) : 58.15 Heparin Dosing Weight: 75kg  Vital Signs: Temp: 97.1 F (36.2 C) (11/05 1244) Temp Source: Oral (11/05 1244) BP: 127/76 (11/05 1244) Pulse Rate: 89 (11/05 1244)  Labs: Recent Labs    11/25/16 2045 11/26/16 0229 11/26/16 1409 11/27/16 0949 11/28/16 0427  HGB  --  10.4*  --  9.6* 9.2*  HCT  --  32.0*  --  29.1* 27.1*  PLT  --  254  --  246 251  CREATININE  --  4.13*  --  5.90* 6.20*  TROPONINI 0.83* 0.84* 0.67*  --   --     Estimated Creatinine Clearance: 8.8 mL/min (A) (by C-G formula based on SCr of 6.2 mg/dL (H)).   Medical History: Past Medical History:  Diagnosis Date  . Anemia 08/29/2011  . Anxiety   . Arthritis    "legs" (01/22/2013)  . CAD (coronary artery disease)    s/p CABG in 1999  . Cataract   . Chronic kidney disease   . CLL (chronic lymphoblastic leukemia)   . Diastolic heart failure ([place])   . GERD (gastroesophageal reflux disease)   . H/O hiatal hernia   . Heart murmur   . Hyperlipidemia   . Hypertension   . IDDM (insulin dependent diabetes mellitus) ([place])   . PAD (peripheral artery disease) ([place])   . Paroxysmal atrial fibrillation ([facility])    a.  identified on ILR as part of StrokeAF study  . Peripheral vascular disease ([place])   . Stroke (cerebrum) [facility])    Assessment: 71 year old female on apixaban for history of afib. Patient's apixaban has been on hold since 11/3 for thoracentesis this morning. New orders to start IV heparin as patient continues ongoing workup for loculated effusion.   Heparin levels likely to not correlate d/t recent apixaban. Will use aptt for monitoring and heparin adjustment initially. No bolus given recent thoracentesis.   Goal of Therapy:  Heparin level 0.3-0.7 units/ml aPTT 66-102 seconds Monitor platelets by anticoagulation protocol: Yes   Plan:  Start heparin infusion at 1000 units/hr Check anti-Xa level in 8 hours and daily while on heparin Continue to monitor H&H and platelets  Will also add on aptt checks for monitoring  Erin Hearing PharmD., BCPS Clinical Pharmacist Pager [phone] 11/28/2016 1:11 PM

## 2016-11-28 NOTE — Progress Notes (Signed)
ANTICOAGULATION CONSULT NOTE - Initial Consult  Pharmacy Consult for heparin Indication: atrial fibrillation  Patient Measurements: Height: 5' 5.5" (166.4 cm) Weight: 175 lb 11.3 oz (79.7 kg) IBW/kg (Calculated) : 58.15 Heparin Dosing Weight: 75kg  Vital Signs: Temp: 98.4 F (36.9 C) (11/05 2000) Temp Source: Oral (11/05 2000) BP: 148/78 (11/05 2100) Pulse Rate: 109 (11/05 2100)  Labs: Recent Labs    11/26/16 0229 11/26/16 1409 11/27/16 0949 11/28/16 0427 11/28/16 1343 11/28/16 2108  HGB 10.4*  --  9.6* 9.2*  --  9.6*  HCT 32.0*  --  29.1* 27.1*  --  27.8*  PLT 254  --  246 251  --  261  APTT  --   --   --   --  43* 134*  LABPROT  --   --   --   --   --  16.4*  INR  --   --   --   --   --  1.33  HEPARINUNFRC  --   --   --   --  1.16*  --   CREATININE 4.13*  --  5.90* 6.20*  --   --   TROPONINI 0.84* 0.67*  --   --   --   --     Estimated Creatinine Clearance: 8.8 mL/min (A) (by C-G formula based on SCr of 6.2 mg/dL (H)).   Assessment: 71 year old female on apixaban for history of afib. Patient's apixaban has been on hold since 11/3 for thoracentesis this morning. New orders to start IV heparin as patient continues ongoing workup for loculated effusion.   aPTT this evening is SUPRAtherapeutic (aPTT 134, goal of 66-102 seconds).   Goal of Therapy:  Heparin level 0.3-0.7 units/ml aPTT 66-102 seconds Monitor platelets by anticoagulation protocol: Yes   Plan:  1. Reduce Heparin drip rate to 850 units/hr (8.5 ml/hr) 2. Noted plans for the drip to stop in the AM at 0700 for planned VATS and drainage of empyema on 11/6 afternoon 3. Will get aPTT/HL with AM labs to help address drip rate when/if resumed post-op  Thank you for allowing pharmacy to be a part of this patient's care.  Alycia Rossetti, PharmD, BCPS Clinical Pharmacist Pager: [phone] If after 3:30p, please call main pharmacy at: [phone] 11/28/2016 10:17 PM

## 2016-11-28 NOTE — Consult Note (Signed)
[facility] CONSULT NOTE    Date: 11/28/2016                  Patient Name:  Christine Santos  MRN:  [id number]  DOB: 1945-08-29  Age / Sex:  71 y.o., female         PCP: [name], MD                 Service Requesting Consult: FMTS                 Reason for Consult: Worsening renal function            History of Present Illness: Patient is a 71 y.o. female with a PMHx of CKDIV, CLL s/p chemo, T2DM, HTN, CAD s/p CABG, prior CVA in 2017, afib on eliquis, who was admitted to [facility] on 11/25/2016 for evaluation of productive cough and hemoptysis, found to have left sided pneumonia with effusion. She was admitted to step down for management. She is requiring a moderate amount of respiratory support. She was started on levaquin, aztreonam added. Eliquis was held due to reported hemoptysis, patient currently in afib, continuing metop. CCM is following for effusion/need for thoracentesis today. As outpatient she follows with Dr. [name] for [place] and they have discussed the possible need for HD in the future. Patient reports she has had kidney issues since 1995 with her CLL diagnosis/treatment. Kidney function has been monitored since by PCP then nephro. 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   At this time she reports feeling somewhat improved since admission with regards to breathing. She endorses fatigue. She is NPO currently but endorses decreased appetite. She has some fevers/chills, some nausea, constipation. No vomiting. She is awaiting thoracentesis. She reports some urine output this morning.   Medications: Outpatient medications: Medications Prior to Admission  Medication Sig Dispense Refill Last Dose  . allopurinol (ZYLOPRIM) 100 MG tablet TAKE 1 TABLET(100 MG) BY MOUTH DAILY 90 tablet 3 11/25/2016 at Unknown time  . amLODipine (NORVASC) 10 MG tablet TAKE 1 TABLET(10 MG) BY MOUTH DAILY 90 tablet 0 11/25/2016 at Unknown  time  . Ascorbic Acid (VITAMIN C) 100 MG tablet Take 100 mg by mouth daily.   11/25/2016 at Unknown time  . atorvastatin (LIPITOR) 80 MG tablet TAKE 1 TABLET BY MOUTH DAILY (Patient taking differently: TAKE 80 mg in the evening) 90 tablet 3 11/23/2016  . carboxymethylcellulose (REFRESH PLUS) 0.5 % SOLN 1 drop 3 (three) times daily as needed.   11/24/2016 at prn  . ELIQUIS 5 MG TABS tablet TAKE 1 TABLET(5 MG) BY MOUTH TWICE DAILY 60 tablet 11 11/25/2016 at 1000  . fenofibrate (TRICOR) 48 MG tablet Take 1 tablet (48 mg total) by mouth daily. (Patient taking differently: Take 48 mg by mouth at bedtime. ) 90 tablet 3 11/23/2016 at Unknown time  . ferrous sulfate 325 (65 FE) MG tablet Take 1 tablet (325 mg total) by mouth daily with breakfast.  3 11/25/2016 at Unknown time  . GNP GARLIC EXTRACT PO Take 1 tablet by mouth daily.    11/24/2016 at Unknown time  . HUMALOG KWIKPEN 100 UNIT/ML KiwkPen Inject 5 Units as directed 3 (three) times daily.  3 11/24/2016 at Unknown time  . hydrALAZINE (APRESOLINE) 50 MG tablet Take 1 tablet (50 mg total) by mouth 3 (three) times daily. 90 tablet 9 11/25/2016 at Unknown time  . Insulin Glargine (LANTUS SOLOSTAR) 100 UNIT/ML  Solostar Pen Inject 37 Units into the skin daily.    11/25/2016 at Unknown time  . Melatonin 10 MG CAPS Take 10 mg by mouth at bedtime as needed (sleep).    Past Week at prn  . metoprolol tartrate (LOPRESSOR) 25 MG tablet TAKE 1/2 TABLET BY MOUTH TWICE DAILY (Patient taking differently: TAKE 12.5 TABLET BY MOUTH TWICE DAILY) 90 tablet 3 11/25/2016 at 1000  . Multiple Vitamins-Minerals (MULTIVITAMINS THER. W/MINERALS) TABS Take 1 tablet by mouth daily.     11/25/2016 at Unknown time  . omega-3 acid ethyl esters (LOVAZA) 1 g capsule Take 2 g by mouth 2 (two) times daily.    11/24/2016 at Unknown time  . pantoprazole (PROTONIX) 40 MG tablet TAKE 1 TABLET BY MOUTH DAILY (Patient taking differently: TAKE 40 mh TABLET BY MOUTH DAILY) 90 tablet 3 Past Week at Unknown  time  . PARoxetine (PAXIL) 20 MG tablet TAKE 1 TABLET BY MOUTH EVERY MORNING (Patient taking differently: TAKE 20 mg TABLET BY MOUTH EVERY MORNING) 90 tablet 3 11/25/2016 at Unknown time  . torsemide (DEMADEX) 100 MG tablet Take 100 mg by mouth daily.   11/25/2016 at Unknown time  . traMADol (ULTRAM) 50 MG tablet TAKE 1 TABLET BY MOUTH FOUR TIMES DAILY (Patient taking differently: TAKE 50 mg TABLET BY MOUTH FOUR TIMES DAILY) 360 tablet 1 11/25/2016 at Unknown time    Current medications: Current Facility-Administered Medications  Medication Dose Route Frequency Provider Last Rate Last Dose  . 0.9 %  sodium chloride infusion   Intravenous Continuous Tanija Germani C, DO      . amLODipine (NORVASC) tablet 10 mg  10 mg Oral Daily Rogue Bussing, MD   10 mg at 11/28/16 0936  . atorvastatin (LIPITOR) tablet 80 mg  80 mg Oral q1800 Rogue Bussing, MD   80 mg at 11/27/16 1741  . aztreonam (AZACTAM) 250 mg in dextrose 5 % 50 mL IVPB  250 mg Intravenous Q8H Bryk, Veronda P, RPH 100 mL/hr at 11/28/16 1031 250 mg at 11/28/16 1031  . benzonatate (TESSALON) capsule 100 mg  100 mg Oral TID Hammonds, Sharyn Blitz, MD   100 mg at 11/28/16 0936  . dextromethorphan-guaiFENesin (MUCINEX DM) 30-600 MG per 12 hr tablet 1 tablet  1 tablet Oral BID Hammonds, Sharyn Blitz, MD   1 tablet at 11/28/16 0936  . fenofibrate tablet 54 mg  54 mg Oral Daily Rogue Bussing, MD   54 mg at 11/28/16 2725  . ferrous sulfate tablet 325 mg  325 mg Oral Q breakfast Rogue Bussing, MD   325 mg at 11/28/16 0800  . hydrALAZINE (APRESOLINE) tablet 50 mg  50 mg Oral TID Rogue Bussing, MD   50 mg at 11/28/16 3664  . HYDROcodone-acetaminophen (NORCO/VICODIN) 5-325 MG per tablet 1 tablet  1 tablet Oral Q6H PRN Hammonds, Sharyn Blitz, MD      . insulin aspart (novoLOG) injection 0-9 Units  0-9 Units Subcutaneous TID WC Rogue Bussing, MD   1 Units at 11/27/16 1741  . insulin glargine (LANTUS)  injection 15 Units  15 Units Subcutaneous q morning - 10a Rogue Bussing, MD   15 Units at 11/27/16 [phone]  . levalbuterol (XOPENEX) nebulizer solution 0.63 mg  0.63 mg Inhalation Q6H PRN Rogue Bussing, MD      . levofloxacin [facility]) IVPB 500 mg  500 mg Intravenous Q48H Laren Everts, RPH      . Melatonin TABS 9 mg  9 mg  Oral QHS PRN Rogue Bussing, MD      . metoCLOPramide [facility]) injection 5 mg  5 mg Intravenous Q6H Bland, Scott, DO   5 mg at 11/28/16 0830  . metoprolol tartrate (LOPRESSOR) tablet 12.5 mg  12.5 mg Oral BID Rogue Bussing, MD   12.5 mg at 11/28/16 3614  . pantoprazole (PROTONIX) EC tablet 40 mg  40 mg Oral Daily Rogue Bussing, MD   40 mg at 11/28/16 4315  . PARoxetine (PAXIL) tablet 20 mg  20 mg Oral q morning - 10a Rogue Bussing, MD   20 mg at 11/28/16 4008  . traMADol (ULTRAM) tablet 50 mg  50 mg Oral Q6H PRN Sherene Sires, DO   50 mg at 11/27/16 0150      Allergies: Allergies  Allergen Reactions  . Other     Darvocet  . Penicillins   . Amoxicillin Rash    Has patient had a PCN reaction causing immediate rash, facial/tongue/throat swelling, SOB or lightheadedness with hypotension: No Has patient had a PCN reaction causing severe rash involving mucus membranes or skin necrosis: No Has patient had a PCN reaction that required hospitalization: No Has patient had a PCN reaction occurring within the last 10 years: No If all of the above answers are "NO", then may proceed with Cephalosporin use.   . Cephalexin Rash  . Codeine Phosphate Rash      Past Medical History: Past Medical History:  Diagnosis Date  . Anemia 08/29/2011  . Anxiety   . Arthritis    "legs" (01/22/2013)  . CAD (coronary artery disease)    s/p CABG in 1999  . Cataract   . Chronic kidney disease   . CLL (chronic lymphoblastic leukemia)   . Diastolic heart failure ([place])   . GERD (gastroesophageal reflux disease)   . H/O hiatal  hernia   . Heart murmur   . Hyperlipidemia   . Hypertension   . IDDM (insulin dependent diabetes mellitus) ([place])   . PAD (peripheral artery disease) ([place])   . Paroxysmal atrial fibrillation (HCC)    a. identified on ILR as part of StrokeAF study  . Peripheral vascular disease ([place])   . Stroke (cerebrum) [facility])      Past Surgical History: Past Surgical History:  Procedure Laterality Date  . ANGIOPLASTY / STENTING FEMORAL Right 09/2010   SFA/notes 11/25/2010 (01/22/2013)  . ANGIOPLASTY / STENTING FEMORAL Left 11/2010   SFA/notes 11/25/2010 (01/22/2013)  . ANGIOPLASTY / STENTING ILIAC     [name] 11/25/2010 (01/22/2013)  . CATARACT EXTRACTION W/ INTRAOCULAR LENS  IMPLANT, BILATERAL Bilateral ?2011  . CHOLECYSTECTOMY  1993  . CORONARY ARTERY BYPASS GRAFT  10/1994   "CABG X3"  . HEEL SPUR EXCISION Bilateral 1970's  . SHOULDER OPEN ROTATOR CUFF REPAIR Bilateral 1990's   "2 times on 1 side; once on the other"  . TRANSLUMINAL ATHERECTOMY FEMORAL ARTERY Right 01/22/2013   & balloon  . TUBAL LIGATION  1980's     Family History: Family History  Problem Relation Age of Onset  . Heart disease Mother   . Heart attack Mother   . Hypertension Mother   . Kidney disease Mother   . Pneumonia Father   . Heart attack Father   . Hyperlipidemia Father   . Diabetes Sister   . Breast cancer Sister   . Arthritis Brother   . Diabetes Brother   . Heart attack Sister   . Obesity Sister   . Hypertension Sister   .  Heart attack Sister   . Cancer Sister        breast  . Hyperlipidemia Sister   . Hypertension Sister   . Hyperlipidemia Sister   . Breast cancer Maternal Grandmother      Social History: Social History   Socioeconomic History  . Marital status: Divorced    Spouse name: Not on file  . Number of children: 2  . Years of education: 53  . Highest education level: Not on file  Social Needs  . Financial resource strain: Not on file  . Food insecurity - worry: Not on file  .  Food insecurity - inability: Not on file  . Transportation needs - medical: Not on file  . Transportation needs - non-medical: Not on file  Occupational History  . Occupation: DISABILITY    Employer: OTHER    Comment: Worked at [employer]  . Smoking status: Never Smoker  . Smokeless tobacco: Never Used  Substance and Sexual Activity  . Alcohol use: No  . Drug use: No  . Sexual activity: Not Currently  Other Topics Concern  . Not on file  Social History Narrative   Health Care POA:    Emergency Contact: sister, [name] (h) [phone]   End of Life Plan:    Who lives with you: female friend   Any pets: none   Diet: Pt has a varied diet of protein, starch, vegetables   Exercise: Pt does not have any regular exercise routine.   Seatbelts: Pt reports wearing seatbelt when in vehicles.    Sun Exposure/Protection: Pt reports using sun screen.   Hobbies: going to lake, biking, blue [place] music      Current Social History 08/16/2016        Patient lives with [name] (Significant other) in one level home 08/16/2016   Transportation: Patient has own vehicle 08/16/2016   Important Relationships [name] and sisters, [name] and Vaughan Basta 08/16/2016    Pets: None 08/16/2016   Education / Work:  12 th grade/ Disabled ([employer]) 08/16/2016   Interests / Fun: Going to [facility], out to eat with friends 08/16/2016   Current Stressors: Bilat foot pain (neuropathy) 08/16/2016   Religious / Personal Beliefs: Raised [employer], goes to [employer] 08/16/2016   L. Ducatte, RN, BSN                                                                                                             Review of Systems: As per HPI  Vital Signs: Blood pressure (!) 151/68, pulse (!) 121, temperature (!) 97.2 F (36.2 C), temperature source Axillary, resp. rate (!) 23, height 5' 5.5" (1.664 m), weight 175 lb 11.3 oz (79.7 kg), SpO2 93 %.  Weight trends: Filed Weights   11/25/16 1455  11/26/16 0502 11/28/16 0250  Weight: 172 lb (78 kg) 172 lb 14.4 oz (78.4 kg) 175 lb 11.3 oz (79.7 kg)    Physical Exam: General: Vital signs reviewed and noted. Well-developed, well-nourished, in no acute distress; venturi  mask in place. alert, appropriate and cooperative throughout examination.  Head: Normocephalic, atraumatic.  Eyes: PERRL, EOMI, No signs of anemia or jaundince.  Mouth: Mucous membranes moist, not inflammed, nonerythematous.  Lungs:  Normal respiratory effort. Diminished lung sounds in bases bilaterally. Crackles L>R in bases bilaterally  Heart: Tachycardic, irregular rhythm. No MRG.  Abdomen:  Soft, non-distended. +BS  Extremities: Warm, well perfused, no edema or cyanosis  Neurologic: A&O X3, no focal deficits  Skin: Warm. Dry. No visible rashes or lesions   Lab results: Basic Metabolic Panel: Recent Labs  Lab 11/26/16 0229 11/27/16 0949 11/28/16 0427  NA 136 133* 132*  K 3.9 4.3 4.2  CL 98* 96* 94*  CO2 25 22 21*  GLUCOSE 141* 133* 135*  BUN 72* 105* 120*  CREATININE 4.13* 5.90* 6.20*  CALCIUM 8.6* 8.2* 8.2*  MG  --   --  1.8  PHOS  --   --  6.2*    Liver Function Tests: Recent Labs  Lab 11/25/16 1503 11/28/16 0427  AST 36  --   ALT 24  --   ALKPHOS 61  --   BILITOT 0.9  --   PROT 5.8*  --   ALBUMIN 2.7* 2.2*   No results for input(s): LIPASE, AMYLASE in the last 168 hours. No results for input(s): AMMONIA in the last 168 hours.  CBC: Recent Labs  Lab 11/25/16 1503 11/26/16 0229 11/27/16 0949 11/28/16 0427  WBC 26.5* 23.7* 17.9* 17.6*  NEUTROABS 24.1*  --   --  15.2*  HGB 10.0* 10.4* 9.6* 9.2*  HCT 30.5* 32.0* 29.1* 27.1*  MCV 95.3 95.2 93.9 91.2  PLT 270 254 246 251    Cardiac Enzymes: Recent Labs  Lab 11/25/16 2045 11/26/16 0229 11/26/16 1409  TROPONINI 0.83* 0.84* 0.67*    BNP: Invalid input(s): POCBNP  CBG: Recent Labs  Lab 11/27/16 1312 11/27/16 1700 11/27/16 2107 11/28/16 0314 11/28/16 0759  GLUCAP 108*  132* 132* 131* 136*    Microbiology: Results for orders placed or performed during the hospital encounter of 11/25/16  Culture, blood (routine x 2)     Status: None (Preliminary result)   Collection Time: 11/26/16  3:42 PM  Result Value Ref Range Status   Specimen Description BLOOD LEFT ANTECUBITAL  Final   Special Requests   Final    BOTTLES DRAWN AEROBIC AND ANAEROBIC Blood Culture results may not be optimal due to an excessive volume of blood received in culture bottles   Culture NO GROWTH < 24 HOURS  Final   Report Status PENDING  Incomplete  Culture, blood (routine x 2)     Status: None (Preliminary result)   Collection Time: 11/26/16  3:42 PM  Result Value Ref Range Status   Specimen Description BLOOD LEFT HAND  Final   Special Requests   Final    BOTTLES DRAWN AEROBIC AND ANAEROBIC Blood Culture results may not be optimal due to an excessive volume of blood received in culture bottles   Culture NO GROWTH < 24 HOURS  Final   Report Status PENDING  Incomplete  MRSA PCR Screening     Status: None   Collection Time: 11/27/16 11:01 PM  Result Value Ref Range Status   MRSA by PCR NEGATIVE NEGATIVE Final    Comment:        The GeneXpert MRSA Assay (FDA approved for NASAL specimens only), is one component of a comprehensive MRSA colonization surveillance program. It is not intended to diagnose MRSA infection nor to guide  or monitor treatment for MRSA infections.     Coagulation Studies: No results for input(s): LABPROT, INR in the last 72 hours.  Urinalysis: Recent Labs    11/25/16 1855  COLORURINE YELLOW  LABSPEC 1.012  PHURINE 5.0  GLUCOSEU NEGATIVE  HGBUR NEGATIVE  BILIRUBINUR NEGATIVE  KETONESUR NEGATIVE  PROTEINUR 100*  NITRITE NEGATIVE  LEUKOCYTESUR LARGE*      Imaging: Ct Chest Wo Contrast  Result Date: 11/26/2016 CLINICAL DATA:  Patient with respiratory failure. EXAM: CT CHEST WITHOUT CONTRAST TECHNIQUE: Multidetector CT imaging of the chest was  performed following the standard protocol without IV contrast. COMPARISON:  Chest radiograph 11/25/2016. FINDINGS: Cardiovascular: Heart is enlarged. No pericardial effusion. Coronary arterial vascular calcifications. Thoracic aortic vascular calcifications. Stable fluid density structure along the anterior right aspect of the mediastinum (image 75; series 3). Mediastinum/Nodes: Multiple prominent subcentimeter mediastinal lymph nodes are visualized. The esophagus is unremarkable. Abarrent right subclavian vein. Lungs/Pleura: Bowing of the posterior trachea secondary to expiratory phase imaging. There is a a moderate complex left pleural effusion which appears partially loculated. There is extensive consolidation throughout the left lung. Minimal aerated left upper lobe. Patchy ground-glass and consolidative opacities demonstrated throughout the right upper, right middle and right lower lobes. Trace right pleural fluid. No pneumothorax. Upper Abdomen: Status post cholecystectomy.  No acute process. Musculoskeletal: Thoracic spine degenerative changes. No aggressive or acute appearing osseous lesions. IMPRESSION: 1. There is extensive consolidation throughout the left lung which may be secondary to pneumonia in the appropriate clinical setting. There is a complex appearing loculated moderate left pleural effusion. Recommend radiographic followup to ensure resolution. 2. Patchy ground-glass and consolidative opacities scattered throughout the right lung which may be secondary to an infectious or inflammatory process. Recommend attention on follow-up. 3. Multiple prominent subcentimeter superior mediastinal lymph nodes are nonspecific however likely reactive in etiology. Recommend attention on follow-up. 4. Cardiomegaly.  Coronary arterial atherosclerosis. 5. Aortic Atherosclerosis (ICD10-I70.0). Electronically Signed   By: Lovey Newcomer M.D.   On: 11/26/2016 14:52   Dg Chest Port 1 View  Result Date:  11/28/2016 CLINICAL DATA:  Shortness of breath.  Pneumonia EXAM: PORTABLE CHEST 1 VIEW COMPARISON:  Chest CT 2 days ago. FINDINGS: Extensive opacification of the left lung which is a combination of loculated pleural fluid and atelectasis/ consolidation. Cardiomegaly. Status post CABG. There is an implantable loop recorder. The right lung remains clear. IMPRESSION: 1. History of pneumonia with unchanged extensive left lung consolidation and complex pleural effusion. 2. Cardiomegaly. Electronically Signed   By: Monte Fantasia M.D.   On: 11/28/2016 08:04     Assessment & Plan: Pt is a 71 y.o. yo female with a PMHX of CKDIV, CLL s/p chemo, T2DM, HTN, CAD s/p CABG, prior CVA in 2017, afib on eliquis, was admitted to [facility] on 11/25/2016 with pneumonia and pleural effusion. She has had worsening kidney function during her stay.    1. Acute on chronic kidney disease- creatinine has increased from 3.6 on admission to 6.2 today, BUN 65>120. Patient w/ 600 cc UOP 11/4 but minimal UOP otherwise during hospitalization. Has received one 500 cc bolus. Weight appears stable from outpatient values if not lower than usual. Patient NPO for procedure overnight, has no fluids going. Torsemide was held by primary team due to worsening kidney function but patient did receive this 11/3 and 11/4. Leading differential for worsening kidney function at this time is volume depletion. Electrolytes stable. Patient not volume overloaded on exam. Mental status in tact. Would advise gentle hydration,  close monitoring of kidney function.  -continue to hold torsemide -gentle fluids for now -monitor renal function closely -daily weights, strict I/Os  2. Pneumonia w/ complex loculated left pleural effusion- continue antibiotics, CCM following for thoracentesis today  3. Afib w/ RVR- continue metoprolol, hold eliquis 2/2 hemoptysis  4. T2DM- per primary  5. HTN- patient hypertensive 151/68, continue hydralazine and norvasc  Dispo:  pending clinical improvement  Lucila Maine, DO PGY-2, [facility] 11/28/2016 10:37 AM

## 2016-11-28 NOTE — Progress Notes (Signed)
Pt C/O sudden severe abdominal cramping with nausea.  Describes 10/10 pain in bilateral upper and lower quadrants.  1 mg IV morphine and 5 mg IV reglan ordered and given to Pt.  Pain decreased to 7/10 after medication given. Pt transferred to 2H09.

## 2016-11-28 NOTE — Progress Notes (Addendum)
PULMONARY / CRITICAL CARE MEDICINE   Name: Christine Santos MRN: [id number] DOB: May 25, 1945    ADMISSION DATE:  11/25/2016  HISTORY OF PRESENT ILLNESS:   60yoF with hx CLL, CHF, CKD Stage V, Afib (on eliquis), CVA, CAD, who was admitted 11/25/16 after 2 days of SOB, Productive cough, and small volume hemoptysis at home. She was treated with Levoflox 11/2-11/3 but was stopped today for unclear reasons. She had been on Eliquis (last dose 11/3 AM) which is now being held. Chest CT showed diffuse dense infiltrates within left lung with loculated appearing moderate sized left pleural effusion.    REVIEW OF SYSTEMS:   Review of Systems  Constitutional: Negative.   HENT: Negative.   Eyes: Negative.   Respiratory: Positive for cough, sputum production and shortness of breath. Negative for wheezing.   Cardiovascular: Negative for chest pain.  Gastrointestinal: Negative.   Genitourinary: Negative.   Musculoskeletal: Negative.   Skin: Negative.   Neurological: Negative.   Endo/Heme/Allergies: Negative.   Psychiatric/Behavioral: Negative.    VITAL SIGNS: BP (!) 151/68   Pulse (!) 121   Temp (!) 97.2 F (36.2 C) (Axillary)   Resp (!) 23   Ht 5' 5.5" (1.664 m)   Wt 175 lb 11.3 oz (79.7 kg)   SpO2 93%   BMI 28.79 kg/m   INTAKE / OUTPUT: I/O last 3 completed shifts: In: -  Out: 600 [Urine:600]  PHYSICAL EXAMINATION: General: Elderly female, sitting in bed on 15L O2 via ventimask, in NAD Neuro: AAOx3, moving all extremities HEENT: OP clear, MM moist Cardiovascular: Irreg irreg Lungs: decreased left, wet-sounding cough no accessory muscle use.   Abdomen: Soft NTND, BS+ Musculoskeletal: no LE edema Skin: no rashes   LABS:  BMET Recent Labs  Lab 11/26/16 0229 11/27/16 0949 11/28/16 0427  NA 136 133* 132*  K 3.9 4.3 4.2  CL 98* 96* 94*  CO2 25 22 21*  BUN 72* 105* 120*  CREATININE 4.13* 5.90* 6.20*  GLUCOSE 141* 133* 135*   Electrolytes Recent Labs  Lab 11/26/16 0229  11/27/16 0949 11/28/16 0427  CALCIUM 8.6* 8.2* 8.2*  MG  --   --  1.8  PHOS  --   --  6.2*   CBC Recent Labs  Lab 11/26/16 0229 11/27/16 0949 11/28/16 0427  WBC 23.7* 17.9* 17.6*  HGB 10.4* 9.6* 9.2*  HCT 32.0* 29.1* 27.1*  PLT 254 246 251   Coag's No results for input(s): APTT, INR in the last 168 hours.  Sepsis Markers Recent Labs  Lab 11/26/16 1524 11/26/16 1820 11/27/16 0949 11/28/16 0427  LATICACIDVEN 1.0 1.1  --  0.8  PROCALCITON  --   --  16.71 14.23   ABG Recent Labs  Lab 11/26/16 1552  PHART 7.427  PCO2ART 39.3  PO2ART 60.6*   Liver Enzymes Recent Labs  Lab 11/25/16 1503 11/28/16 0427  AST 36  --   ALT 24  --   ALKPHOS 61  --   BILITOT 0.9  --   ALBUMIN 2.7* 2.2*   Cardiac Enzymes Recent Labs  Lab 11/25/16 2045 11/26/16 0229 11/26/16 1409  TROPONINI 0.83* 0.84* 0.67*   Glucose Recent Labs  Lab 11/27/16 0752 11/27/16 1312 11/27/16 1700 11/27/16 2107 11/28/16 0314 11/28/16 0759  GLUCAP 108* 108* 132* 132* 131* 136*   STUDIES:  Chest CT (11/26/16):  1. There is extensive consolidation throughout the left lung which may be secondary to pneumonia in the appropriate clinical setting. There is a complex appearing loculated moderate left  pleural effusion.  2. Patchy ground-glass and consolidative opacities scattered throughout the right lung  3. Multiple prominent subcentimeter superior mediastinal lymph nodes are nonspecific however likely reactive in etiology. 4. Cardiomegaly.  Coronary arterial atherosclerosis. 5. Aortic Atherosclerosis (ICD10-I70.0).  CULTURES: Blood cultures (11/3): no growth Urine strep ag POS  ANTIBIOTICS: Levofloxacin 11/2>>11/3  LINES/TUBES: PIV's  ASSESSMENT / PLAN: 55yoF with hx CLL, CHF, CKD Stage V, Afib (on eliquis), CVA, CAD, who was admitted 11/25/16 after 2 days of SOB, Productive cough, and small volume hemoptysis, found to have Dense Left-sided Pneumonia with Loculated parapneumonic effusion  (presumably empyema given its appearance), and Acute Hypoxic Respiratory failure requiring 15L O2.   PULMONARY 1. Pneumonia with Loculated Left-sided Parapneumonic effusion; Acute Hypoxic respiratory failure: Pneumococcal - proceed with thora - Continue 15L O2 and wean as tolerated.  - procalcitonin high, await  sputum culture, ct  Aztreonam and Levoflox. -  MRSA nares neg - start Guaifenesin/Dextromethorphan BID; Change tessalon perrles from 200mg  BID PRN (hasn't been receiving it) to 100mg  PO TID scheduled. - start IS and Flutter valve.    CARDIOVASCULAR 1. Afib: - holding Elquis (last dose 11/3 AM) - continue metop 12.5mg  PO BID  2. Diastolic CHF: - TTE on 9/79/48 showed grade 1 diastolic dysfunction  RENAL 1. AKI-on-CKD Stage V: baseline GFR hovers around 15-20, right at border of stage IV to stage V, now with worsening of renal function with BUN up to 105 and Creatinine up to 5.90. This may be because she received torsemide on 11/3 and 11/4. Although she has a history of CHF, she is NOT fluid overloaded at this time. Will hold off on any further diuresis. May actually need some gentle IVF. Will monitor UOP closely.     HEMATOLOGIC 1. Hx CLL  Kara Mead MD. Shade Flood. [facility] Pager [phone] If no response call [phone]       11/28/2016, 10:07 AM

## 2016-11-28 NOTE — Procedures (Signed)
Thoracentesis Procedure Note  Pre-operative Diagnosis: left loculated pleural effusion  Post-operative Diagnosis: same  Indications: left loculated pleural effusion  Procedure Details  Consent: Informed consent was obtained. Risks of the procedure were discussed including: infection, bleeding, pain, pneumothorax.  Under sterile conditions the patient was positioned. Betadine solution and sterile drapes were utilized.  2% buffered lidocaine was used to anesthetize the left seventh rib space. Fluid was obtained withminimal blood loss.  A dressing was applied to the wound and wound care instructions were provided.   Findings 50 ml of cloudy pleural fluid was obtained. A sample was sent to Pathology for cytogenetics, flow, and cell counts, as well as for infection analysis.  Complications:  None; patient tolerated the procedure well.          Condition: stable  Plan A follow up chest x-ray was ordered. Bed Rest for 2 hours. Tylenol 650 mg. for pain. Referral to TCTS for loculated effusion  Attending Attestation: I performed the procedure.  Rigoberto Noel MD

## 2016-11-28 NOTE — Progress Notes (Signed)
Family Medicine Teaching Service Daily Progress Note Intern Pager: [phone]  Patient name: Christine Santos Medical record number: [id number] Date of birth: [birth year] Age: 71 y.o. Gender: female  Primary Care Provider: [name], MD Consultants: Pulmonology  Code Status: Full   Pt Overview and Major Events to Date:  11/2: Patient admitted for acute hypoxic respiratory failure, treatment started for PNA  11/3: Pulmonology consulted when CT scan chest showed substantial consolidation of left lung with a moderate to large left pleura effusion   Assessment and Plan: Christine Santos is a 71 y.o. female presenting with cough and dyspnea. PMH is significant for T2DM with peripheral neuropathy, CLL s/p chemotherapy, HTN, CKDVI, CAD s/p CABG, hx CVA in 2017, hx of afib on eliquis.   Pneumonia: Prolactin elevatedx2, Strep pneumo urine antigen positive, WBC improved to 17.6. afebrile. 15 O2 on Venturi mask. O2 Sat 96%. Thoracentesis and possible  therapeutic bronch planned for 11/5. Eliquis is held for 48 hrs for this. Lactic acid acid stable @ 1.1 > 0.8. - Levaquin 750 mg (11/2- ), azactam (11/5 - )  - xopenox for elevated RR?, tessalon perles prn - supplemental O2 to maintain O2 sats >/= 92% - trend CBC;  -pulmonology consulted; appreciate recs    AKI on CKD stage 4. Cr worsening to 6.2 on 11/5. Baseline Cr ~3.5.AKI. Pt had urinary retension yesterday of ~569mL. I/O cath required. Cr increased despite ~656mL UOP. At home on ferrous sulfate. On admit Hgb 10.0 with baseline ~10.  Follows with [employer]  - appreciate Nephrology consult.  - monitor CBC and BMP - avoid nephrotoxic medications -holding torsemide   Anemia Hg 9.2 today, from Hgb 10.0 on admit. Hg baseline ~10. Likely due to CKD. On ferrous sulfate at home.  - Follow Hg - hold iron due to PNA  H/o diastolic CHF w/ h/o CAD s/p CABG 1996.Weight stable and fluctuates only a 1-2 lbs at a time. At home aking torsemide  daily, alternating doses of 50 mg and 100 mg; metoprolol tartrate 12.5 mg BID; norvasc 10 mg daily; atorvastatin 80 mg daily. Last echo 2017 showed EF of 55-60% with G1DD. Troponin trend: 0.83>0.84>0.67.  - hold torsemide due to AKI.  - Continue metoprolol, norvas, atorvastatin - I/Os - Daily weights   Afib:  HR rate 100s - 130s. Rate control with metoprolol 12.5 mg BID.  - cont metoprolol - eleiquis held since 11/3 in preparation for thoracentesis.  - plan to restart if outside of 72 hr window of not being anticoagulated - will hopefully improve after thorocentesis  T2DM CBGs stable. Last a1c 6.0 on 07/06/16, at home on lantus 37U daily with 5U humalog tid with meals. - monitor CBGs qACHS - SSI, lantus 15 U in a.m.  HTN: mildy hypertensive 150s/68 with widdened pulse pressure . At home on norvasc, metoprolol, torsemide - continue home meds - will hopefully improve after thorocentesis  - consider increasing metoprolol if does not improve  HLD at home on fenofibrate and atorvastatin.last lipid panel 09/04/15. LDL 99. Triglycerides 188. - continue home meds  GERDat home on protonix - continue home med  Goutat home on allopurinol  - continue home med  Anxiety at home on paxil 20 mg daily - may be leading to urinary retention. Will decrease to 10 mg  FEN/GI: heart healthy, carb modified diet; protonix Prophylaxis: SCDs  Disposition: Pending further work up by pulmonology   Subjective:  Patient reports feeling "fine". Denies abdominal pain or chest pain  Objective:  Temp:  [98.3 F (36.8 C)-98.6 F (37 C)] 98.6 F (37 C) (11/05 0250) Pulse Rate:  [88-134] 105 (11/05 0600) Resp:  [18-30] 30 (11/05 0600) BP: (111-157)/(54-94) 142/63 (11/05 0600) SpO2:  [93 %-96 %] 96 % (11/05 0600) FiO2 (%):  [50 %] 50 % (11/05 0250) Weight:  [175 lb 11.3 oz (79.7 kg)] 175 lb 11.3 oz (79.7 kg) (11/05 0250) Physical Exam: General: elderly female lying in bed, venturi mask in place   Cardiovascular: Tachycardic irregularly irregular. No murmur appreciated.  Respiratory: Decreased breath sounds at baselines bilaterally with crackles L >R. Comfortable WOB.  Abdomen: soft, NTND  Extremities: No LE edema. Moves all extremities spontaneously.   Laboratory: Recent Labs  Lab 11/26/16 0229 11/27/16 0949 11/28/16 0427  WBC 23.7* 17.9* 17.6*  HGB 10.4* 9.6* 9.2*  HCT 32.0* 29.1* 27.1*  PLT 254 246 251   Recent Labs  Lab 11/25/16 1503 11/26/16 0229 11/27/16 0949 11/28/16 0427  NA 136 136 133* 132*  K 3.6 3.9 4.3 4.2  CL 98* 98* 96* 94*  CO2 25 25 22  21*  BUN 65* 72* 105* 120*  CREATININE 3.68* 4.13* 5.90* 6.20*  CALCIUM 8.9 8.6* 8.2* 8.2*  PROT 5.8*  --   --   --   BILITOT 0.9  --   --   --   ALKPHOS 61  --   --   --   ALT 24  --   --   --   AST 36  --   --   --   GLUCOSE 160* 141* 133* 135*     Imaging/Diagnostic Tests: Dg Chest 2 View  Result Date: 11/25/2016 CLINICAL DATA:  71 y/o  F; chest pain with cough and hemoptysis. EXAM: CHEST  2 VIEW COMPARISON:  04/07/2014 PET-CT.  01/10/2009 chest radiograph. FINDINGS: Mild cardiomegaly. Aortic atherosclerosis with calcification. Status post CABG. Median sternotomy wires are aligned. Extensive consolidations throughout the left mid and lower lung zones. Small to moderate left effusion. Right upper quadrant cholecystectomy clips. Left humerus rotator cuff repair anchor. Multilevel degenerative changes of the spine. IMPRESSION: Extensive consolidation throughout left lung and small to moderate left effusion probably representing pneumonia. Follow-up to resolution is recommended to exclude underlying malignancy. Electronically Signed   By: Kristine Garbe M.D.   On: 11/25/2016 15:23   Ct Chest Wo Contrast  Result Date: 11/26/2016 CLINICAL DATA:  Patient with respiratory failure. EXAM: CT CHEST WITHOUT CONTRAST TECHNIQUE: Multidetector CT imaging of the chest was performed following the standard protocol  without IV contrast. COMPARISON:  Chest radiograph 11/25/2016. FINDINGS: Cardiovascular: Heart is enlarged. No pericardial effusion. Coronary arterial vascular calcifications. Thoracic aortic vascular calcifications. Stable fluid density structure along the anterior right aspect of the mediastinum (image 75; series 3). Mediastinum/Nodes: Multiple prominent subcentimeter mediastinal lymph nodes are visualized. The esophagus is unremarkable. Abarrent right subclavian vein. Lungs/Pleura: Bowing of the posterior trachea secondary to expiratory phase imaging. There is a a moderate complex left pleural effusion which appears partially loculated. There is extensive consolidation throughout the left lung. Minimal aerated left upper lobe. Patchy ground-glass and consolidative opacities demonstrated throughout the right upper, right middle and right lower lobes. Trace right pleural fluid. No pneumothorax. Upper Abdomen: Status post cholecystectomy.  No acute process. Musculoskeletal: Thoracic spine degenerative changes. No aggressive or acute appearing osseous lesions. IMPRESSION: 1. There is extensive consolidation throughout the left lung which may be secondary to pneumonia in the appropriate clinical setting. There is a complex appearing loculated moderate left pleural effusion. Recommend  radiographic followup to ensure resolution. 2. Patchy ground-glass and consolidative opacities scattered throughout the right lung which may be secondary to an infectious or inflammatory process. Recommend attention on follow-up. 3. Multiple prominent subcentimeter superior mediastinal lymph nodes are nonspecific however likely reactive in etiology. Recommend attention on follow-up. 4. Cardiomegaly.  Coronary arterial atherosclerosis. 5. Aortic Atherosclerosis (ICD10-I70.0). Electronically Signed   By: Lovey Newcomer M.D.   On: 11/26/2016 14:52    Bonnita Hollow, MD 11/28/2016, 7:10 AM PGY-3, [place] Intern  pager: [phone], text pages welcome

## 2016-11-29 ENCOUNTER — Inpatient Hospital Stay (HOSPITAL_COMMUNITY): Payer: Medicare Other | Admitting: Certified Registered Nurse Anesthetist

## 2016-11-29 ENCOUNTER — Inpatient Hospital Stay (HOSPITAL_COMMUNITY): Payer: Medicare Other

## 2016-11-29 ENCOUNTER — Encounter (HOSPITAL_COMMUNITY): Payer: Self-pay | Admitting: Orthopedic Surgery

## 2016-11-29 ENCOUNTER — Encounter (HOSPITAL_COMMUNITY): Admission: EM | Disposition: A | Discharge status: Home Health | Payer: Self-pay | Source: Home / Self Care

## 2016-11-29 DIAGNOSIS — J13 Pneumonia due to Streptococcus pneumoniae: Secondary | ICD-10-CM

## 2016-11-29 DIAGNOSIS — J869 Pyothorax without fistula: Secondary | ICD-10-CM

## 2016-11-29 DIAGNOSIS — R0602 Shortness of breath: Secondary | ICD-10-CM

## 2016-11-29 DIAGNOSIS — A491 Streptococcal infection, unspecified site: Secondary | ICD-10-CM

## 2016-11-29 DIAGNOSIS — J9601 Acute respiratory failure with hypoxia: Secondary | ICD-10-CM

## 2016-11-29 HISTORY — PX: VIDEO ASSISTED THORACOSCOPY (VATS)/EMPYEMA: SHX6172

## 2016-11-29 LAB — BASIC METABOLIC PANEL
ANION GAP: 16 — AB (ref 5–15)
BUN: 136 mg/dL — ABNORMAL HIGH (ref 6–20)
CHLORIDE: 95 mmol/L — AB (ref 101–111)
CO2: 21 mmol/L — ABNORMAL LOW (ref 22–32)
Calcium: 8 mg/dL — ABNORMAL LOW (ref 8.9–10.3)
Creatinine, Ser: 6.54 mg/dL — ABNORMAL HIGH (ref 0.44–1.00)
GFR, EST AFRICAN AMERICAN: 7 mL/min — AB (ref >60)
GFR, EST NON AFRICAN AMERICAN: 6 mL/min — AB (ref >60)
Glucose, Bld: 164 mg/dL — ABNORMAL HIGH (ref 65–99)
POTASSIUM: 4.4 mmol/L (ref 3.5–5.1)
SODIUM: 132 mmol/L — AB (ref 135–145)

## 2016-11-29 LAB — URINALYSIS, ROUTINE W REFLEX MICROSCOPIC
Bilirubin Urine: NEGATIVE
Glucose, UA: NEGATIVE mg/dL
Hgb urine dipstick: NEGATIVE
Ketones, ur: NEGATIVE mg/dL
Nitrite: NEGATIVE
Protein, ur: NEGATIVE mg/dL
Specific Gravity, Urine: 1.012 (ref 1.005–1.030)
pH: 5 (ref 5.0–8.0)

## 2016-11-29 LAB — PH, BODY FLUID: pH, Body Fluid: 7.4

## 2016-11-29 LAB — GLUCOSE, CAPILLARY
GLUCOSE-CAPILLARY: 130 mg/dL — AB (ref 65–99)
GLUCOSE-CAPILLARY: 132 mg/dL — AB (ref 65–99)
Glucose-Capillary: 166 mg/dL — ABNORMAL HIGH (ref 65–99)
Glucose-Capillary: 123 mg/dL — ABNORMAL HIGH (ref 65–99)

## 2016-11-29 LAB — APTT: APTT: 71 s — AB (ref 24–36)

## 2016-11-29 LAB — CBC WITH DIFFERENTIAL/PLATELET
BASOS ABS: 0 10*3/uL (ref 0.0–0.1)
Basophils Relative: 0 %
EOS ABS: 0 10*3/uL (ref 0.0–0.7)
EOS PCT: 0 %
HCT: 27.1 % — ABNORMAL LOW (ref 36.0–46.0)
HEMOGLOBIN: 9.3 g/dL — AB (ref 12.0–15.0)
LYMPHS ABS: 1.1 10*3/uL (ref 0.7–4.0)
Lymphocytes Relative: 7 %
MCH: 31 pg (ref 26.0–34.0)
MCHC: 34.3 g/dL (ref 30.0–36.0)
MCV: 90.3 fL (ref 78.0–100.0)
Monocytes Absolute: 1.5 10*3/uL — ABNORMAL HIGH (ref 0.1–1.0)
Monocytes Relative: 10 %
NEUTROS PCT: 83 %
Neutro Abs: 12.9 10*3/uL — ABNORMAL HIGH (ref 1.7–7.7)
PLATELETS: 246 10*3/uL (ref 150–400)
RBC: 3 MIL/uL — AB (ref 3.87–5.11)
RDW: 14.7 % (ref 11.5–15.5)
WBC: 15.5 10*3/uL — AB (ref 4.0–10.5)

## 2016-11-29 LAB — HEPARIN LEVEL (UNFRACTIONATED): HEPARIN UNFRACTIONATED: 1.04 [IU]/mL — AB (ref 0.30–0.70)

## 2016-11-29 LAB — PREPARE RBC (CROSSMATCH)

## 2016-11-29 LAB — MAGNESIUM: Magnesium: 2 mg/dL (ref 1.7–2.4)

## 2016-11-29 LAB — PROCALCITONIN: PROCALCITONIN: 8.63 ng/mL

## 2016-11-29 SURGERY — VIDEO ASSISTED THORACOSCOPY (VATS)/EMPYEMA
Anesthesia: General | Site: Chest | Laterality: Left

## 2016-11-29 MED ORDER — BUPIVACAINE HCL (PF) 0.5 % IJ SOLN
INTRAMUSCULAR | Status: AC
  Filled 2016-11-29: qty 30

## 2016-11-29 MED ORDER — PROPOFOL 10 MG/ML IV BOLUS
INTRAVENOUS | Status: AC
  Filled 2016-11-29: qty 20

## 2016-11-29 MED ORDER — SODIUM CHLORIDE 0.9 % IV SOLN
25.0000 ug/h | INTRAVENOUS | Status: DC
  Filled 2016-11-29: qty 50

## 2016-11-29 MED ORDER — BUPIVACAINE 0.5 % ON-Q PUMP SINGLE CATH 400 ML
400.0000 mL | INJECTION | Status: DC
  Filled 2016-11-29: qty 400

## 2016-11-29 MED ORDER — FENTANYL CITRATE (PF) 250 MCG/5ML IJ SOLN
INTRAMUSCULAR | Status: AC
  Filled 2016-11-29: qty 5

## 2016-11-29 MED ORDER — MIDAZOLAM HCL 2 MG/2ML IJ SOLN
INTRAMUSCULAR | Status: AC
  Filled 2016-11-29: qty 2

## 2016-11-29 MED ORDER — HEMOSTATIC AGENTS (NO CHARGE) OPTIME
TOPICAL | Status: DC | PRN
  Administered 2016-11-29: 1 via TOPICAL

## 2016-11-29 MED ORDER — SENNOSIDES-DOCUSATE SODIUM 8.6-50 MG PO TABS
1.0000 | ORAL_TABLET | Freq: Every day | ORAL | Status: DC
  Administered 2016-11-30 – 2016-12-06 (×6): 1 via ORAL
  Filled 2016-11-29 (×6): qty 1

## 2016-11-29 MED ORDER — ONDANSETRON HCL 4 MG/2ML IJ SOLN
INTRAMUSCULAR | Status: DC | PRN
  Administered 2016-11-29: 4 mg via INTRAVENOUS

## 2016-11-29 MED ORDER — HYDROMORPHONE HCL 1 MG/ML IJ SOLN: 0.2500 mg | INTRAMUSCULAR | Status: DC | PRN

## 2016-11-29 MED ORDER — ROCURONIUM BROMIDE 100 MG/10ML IV SOLN
INTRAVENOUS | Status: DC | PRN
  Administered 2016-11-29: 50 mg via INTRAVENOUS

## 2016-11-29 MED ORDER — SODIUM CHLORIDE 0.9 % IV SOLN
0.5000 mg/h | INTRAVENOUS | Status: DC
  Filled 2016-11-29: qty 10

## 2016-11-29 MED ORDER — BISACODYL 5 MG PO TBEC
10.0000 mg | DELAYED_RELEASE_TABLET | Freq: Every day | ORAL | Status: DC
  Administered 2016-12-01 – 2016-12-07 (×4): 10 mg via ORAL
  Filled 2016-11-29 (×5): qty 2

## 2016-11-29 MED ORDER — SODIUM CHLORIDE 0.9 % IV SOLN
0.0000 ug/min | INTRAVENOUS | Status: DC
  Administered 2016-11-29: 30 ug/min via INTRAVENOUS
  Administered 2016-11-29: 70 ug/min via INTRAVENOUS
  Filled 2016-11-29: qty 1

## 2016-11-29 MED ORDER — TRAMADOL HCL 50 MG PO TABS: 50.0000 mg | ORAL_TABLET | Freq: Two times a day (BID) | ORAL | Status: DC | PRN

## 2016-11-29 MED ORDER — BUPIVACAINE ON-Q PAIN PUMP (FOR ORDER SET NO CHG): INJECTION | Status: AC

## 2016-11-29 MED ORDER — PROMETHAZINE HCL 25 MG/ML IJ SOLN: 6.2500 mg | INTRAMUSCULAR | Status: DC | PRN

## 2016-11-29 MED ORDER — ACETAMINOPHEN 500 MG PO TABS
1000.0000 mg | ORAL_TABLET | Freq: Four times a day (QID) | ORAL | Status: AC
  Administered 2016-11-30 – 2016-12-04 (×14): 1000 mg via ORAL
  Filled 2016-11-29 (×14): qty 2

## 2016-11-29 MED ORDER — DEXMEDETOMIDINE HCL IN NACL 200 MCG/50ML IV SOLN
INTRAVENOUS | Status: AC
  Filled 2016-11-29: qty 50

## 2016-11-29 MED ORDER — MIDAZOLAM HCL 2 MG/2ML IJ SOLN: 0.5000 mg | Freq: Once | INTRAMUSCULAR | Status: DC | PRN

## 2016-11-29 MED ORDER — POTASSIUM CHLORIDE 10 MEQ/50ML IV SOLN
10.0000 meq | Freq: Every day | INTRAVENOUS | Status: DC | PRN
  Filled 2016-11-29: qty 50

## 2016-11-29 MED ORDER — MEPERIDINE HCL 25 MG/ML IJ SOLN: 6.2500 mg | INTRAMUSCULAR | Status: DC | PRN

## 2016-11-29 MED ORDER — PROPOFOL 10 MG/ML IV BOLUS
INTRAVENOUS | Status: DC | PRN
  Administered 2016-11-29: 80 mg via INTRAVENOUS

## 2016-11-29 MED ORDER — LACTATED RINGERS IV SOLN: INTRAVENOUS | Status: DC | PRN

## 2016-11-29 MED ORDER — DEXMEDETOMIDINE HCL IN NACL 400 MCG/100ML IV SOLN
0.4000 ug/kg/h | INTRAVENOUS | Status: DC
  Administered 2016-11-29 (×2): 0.7 ug/kg/h via INTRAVENOUS
  Administered 2016-11-30: 0.8 ug/kg/h via INTRAVENOUS
  Filled 2016-11-29 (×2): qty 100

## 2016-11-29 MED ORDER — DEXMEDETOMIDINE HCL IN NACL 200 MCG/50ML IV SOLN
INTRAVENOUS | Status: DC | PRN
  Administered 2016-11-29: .5 ug/kg/h via INTRAVENOUS

## 2016-11-29 MED ORDER — SODIUM CHLORIDE 0.9 % IV SOLN
0.0000 ug/min | INTRAVENOUS | Status: DC
  Administered 2016-11-30: 90 ug/min via INTRAVENOUS
  Administered 2016-11-30: 50 ug/min via INTRAVENOUS
  Filled 2016-11-29 (×2): qty 4

## 2016-11-29 MED ORDER — LIDOCAINE HCL (CARDIAC) 20 MG/ML IV SOLN
INTRAVENOUS | Status: DC | PRN
  Administered 2016-11-29: 10 mg via INTRAVENOUS

## 2016-11-29 MED ORDER — FENTANYL 2500MCG IN NS 250ML (10MCG/ML) PREMIX INFUSION
25.0000 ug/h | INTRAVENOUS | Status: DC
  Administered 2016-11-29: 50 ug/h via INTRAVENOUS
  Filled 2016-11-29: qty 250

## 2016-11-29 MED ORDER — SODIUM CHLORIDE 0.9 % IV SOLN: Freq: Once | INTRAVENOUS | Status: DC

## 2016-11-29 MED ORDER — MIDAZOLAM HCL 10 MG/2ML IJ SOLN
INTRAMUSCULAR | Status: AC
  Filled 2016-11-29: qty 4

## 2016-11-29 MED ORDER — ACETAMINOPHEN 160 MG/5ML PO SOLN: 1000.0000 mg | Freq: Four times a day (QID) | ORAL | Status: AC

## 2016-11-29 MED ORDER — DEXTROSE-NACL 5-0.45 % IV SOLN
INTRAVENOUS | Status: DC
  Administered 2016-11-29: 19:00:00 via INTRAVENOUS

## 2016-11-29 MED ORDER — FENTANYL CITRATE (PF) 100 MCG/2ML IJ SOLN
INTRAMUSCULAR | Status: DC | PRN
  Administered 2016-11-29: 200 ug via INTRAVENOUS
  Administered 2016-11-29 (×3): 50 ug via INTRAVENOUS

## 2016-11-29 MED ORDER — DEXTROSE 5 % IV SOLN
INTRAVENOUS | Status: DC | PRN
  Administered 2016-11-29: 15 ug/min via INTRAVENOUS

## 2016-11-29 MED ORDER — PHENYLEPHRINE 40 MCG/ML (10ML) SYRINGE FOR IV PUSH (FOR BLOOD PRESSURE SUPPORT)
PREFILLED_SYRINGE | INTRAVENOUS | Status: AC
  Filled 2016-11-29: qty 10

## 2016-11-29 MED ORDER — FENTANYL CITRATE (PF) 100 MCG/2ML IJ SOLN: 50.0000 ug | Freq: Once | INTRAMUSCULAR | Status: DC

## 2016-11-29 MED ORDER — ONDANSETRON HCL 4 MG/2ML IJ SOLN: 4.0000 mg | Freq: Four times a day (QID) | INTRAMUSCULAR | Status: DC | PRN

## 2016-11-29 MED ORDER — SODIUM CHLORIDE 0.9 % IV SOLN
INTRAVENOUS | Status: DC
  Administered 2016-11-29: 15:00:00 via INTRAVENOUS

## 2016-11-29 MED ORDER — MIDAZOLAM HCL 5 MG/5ML IJ SOLN
INTRAMUSCULAR | Status: DC | PRN
  Administered 2016-11-29: 1 mg via INTRAVENOUS

## 2016-11-29 MED ORDER — 0.9 % SODIUM CHLORIDE (POUR BTL) OPTIME
TOPICAL | Status: DC | PRN
  Administered 2016-11-29: 2000 mL

## 2016-11-29 MED ORDER — DEXAMETHASONE SODIUM PHOSPHATE 10 MG/ML IJ SOLN
INTRAMUSCULAR | Status: DC | PRN
  Administered 2016-11-29: 4 mg via INTRAVENOUS

## 2016-11-29 MED ORDER — FENTANYL BOLUS VIA INFUSION
50.0000 ug | INTRAVENOUS | Status: DC | PRN
Start: 2016-11-29 — End: 2016-12-07
  Filled 2016-11-29: qty 50

## 2016-11-29 MED ORDER — BUPIVACAINE HCL (PF) 0.5 % IJ SOLN
INTRAMUSCULAR | Status: DC | PRN
  Administered 2016-11-29: 5 mL

## 2016-11-29 SURGICAL SUPPLY — 68 items
BAG DECANTER FOR FLEXI CONT (MISCELLANEOUS) IMPLANT
BLADE SURG 11 STRL SS (BLADE) ×3 IMPLANT
CANISTER SUCT 3000ML PPV (MISCELLANEOUS) ×3 IMPLANT
CATH KIT ON Q 5IN SLV (PAIN MANAGEMENT) ×3 IMPLANT
CATH ROBINSON RED A/P 22FR (CATHETERS) IMPLANT
CATH THORACIC 28FR (CATHETERS) IMPLANT
CATH THORACIC 28FR RT ANG (CATHETERS) ×3 IMPLANT
CATH THORACIC 36FR (CATHETERS) IMPLANT
CATH THORACIC 36FR RT ANG (CATHETERS) IMPLANT
CONN ST 1/4X3/8  BEN (MISCELLANEOUS) ×4
CONN ST 1/4X3/8 BEN (MISCELLANEOUS) ×2 IMPLANT
CONT SPEC 4OZ CLIKSEAL STRL BL (MISCELLANEOUS) ×6 IMPLANT
COVER SURGICAL LIGHT HANDLE (MISCELLANEOUS) ×6 IMPLANT
DERMABOND ADVANCED (GAUZE/BANDAGES/DRESSINGS) ×2
DERMABOND ADVANCED .7 DNX12 (GAUZE/BANDAGES/DRESSINGS) ×1 IMPLANT
DRAIN CHANNEL 28F RND 3/8 FF (WOUND CARE) ×3 IMPLANT
DRAPE LAPAROSCOPIC ABDOMINAL (DRAPES) ×3 IMPLANT
DRAPE WARM FLUID 44X44 (DRAPE) IMPLANT
ELECT BLADE 4.0 EZ CLEAN MEGAD (MISCELLANEOUS) ×3
ELECT BLADE 6.5 EXT (BLADE) ×3 IMPLANT
ELECT REM PT RETURN 9FT ADLT (ELECTROSURGICAL) ×3
ELECTRODE BLDE 4.0 EZ CLN MEGD (MISCELLANEOUS) ×1 IMPLANT
ELECTRODE REM PT RTRN 9FT ADLT (ELECTROSURGICAL) ×1 IMPLANT
GAUZE SPONGE 4X4 12PLY STRL (GAUZE/BANDAGES/DRESSINGS) ×3 IMPLANT
GOWN STRL REUS W/ TWL LRG LVL3 (GOWN DISPOSABLE) ×4 IMPLANT
GOWN STRL REUS W/TWL LRG LVL3 (GOWN DISPOSABLE) ×8
KIT BASIN OR (CUSTOM PROCEDURE TRAY) ×3 IMPLANT
KIT ROOM TURNOVER OR (KITS) ×3 IMPLANT
KIT SUCTION CATH 14FR (SUCTIONS) ×6 IMPLANT
NS IRRIG 1000ML POUR BTL (IV SOLUTION) ×9 IMPLANT
PACK CHEST (CUSTOM PROCEDURE TRAY) ×3 IMPLANT
PAD ARMBOARD 7.5X6 YLW CONV (MISCELLANEOUS) ×6 IMPLANT
SEALANT SURG COSEAL 4ML (VASCULAR PRODUCTS) IMPLANT
SEALANT SURG COSEAL 8ML (VASCULAR PRODUCTS) ×3 IMPLANT
SOLUTION ANTI FOG 6CC (MISCELLANEOUS) ×3 IMPLANT
SPONGE TONSIL 1.25 RF SGL STRG (GAUZE/BANDAGES/DRESSINGS) ×6 IMPLANT
SUT CHROMIC 3 0 SH 27 (SUTURE) IMPLANT
SUT ETHILON 3 0 PS 1 (SUTURE) IMPLANT
SUT PROLENE 3 0 SH DA (SUTURE) IMPLANT
SUT PROLENE 4 0 RB 1 (SUTURE)
SUT PROLENE 4-0 RB1 .5 CRCL 36 (SUTURE) IMPLANT
SUT PROLENE 6 0 C 1 30 (SUTURE) IMPLANT
SUT SILK  1 MH (SUTURE) ×4
SUT SILK 1 MH (SUTURE) ×2 IMPLANT
SUT SILK 1 TIES 10X30 (SUTURE) IMPLANT
SUT SILK 2 0SH CR/8 30 (SUTURE) ×3 IMPLANT
SUT SILK 3 0SH CR/8 30 (SUTURE) IMPLANT
SUT VIC AB 1 CTX 18 (SUTURE) ×6 IMPLANT
SUT VIC AB 2 TP1 27 (SUTURE) ×3 IMPLANT
SUT VIC AB 2-0 CT2 18 VCP726D (SUTURE) IMPLANT
SUT VIC AB 2-0 CTX 36 (SUTURE) ×3 IMPLANT
SUT VIC AB 3-0 SH 18 (SUTURE) IMPLANT
SUT VIC AB 3-0 X1 27 (SUTURE) ×3 IMPLANT
SUT VICRYL 0 UR6 27IN ABS (SUTURE) IMPLANT
SUT VICRYL 2 TP 1 (SUTURE) IMPLANT
SWAB COLLECTION DEVICE MRSA (MISCELLANEOUS) ×3 IMPLANT
SWAB CULTURE ESWAB REG 1ML (MISCELLANEOUS) ×3 IMPLANT
SYSTEM SAHARA CHEST DRAIN ATS (WOUND CARE) ×3 IMPLANT
TAPE CLOTH SURG 4X10 WHT LF (GAUZE/BANDAGES/DRESSINGS) ×3 IMPLANT
TIP APPLICATOR SPRAY EXTEND 16 (VASCULAR PRODUCTS) IMPLANT
TOWEL GREEN STERILE (TOWEL DISPOSABLE) ×6 IMPLANT
TOWEL GREEN STERILE FF (TOWEL DISPOSABLE) ×6 IMPLANT
TOWEL OR 17X24 6PK STRL BLUE (TOWEL DISPOSABLE) IMPLANT
TOWEL OR 17X26 10 PK STRL BLUE (TOWEL DISPOSABLE) IMPLANT
TRAP SPECIMEN MUCOUS 40CC (MISCELLANEOUS) ×3 IMPLANT
TRAY FOLEY W/METER SILVER 16FR (SET/KITS/TRAYS/PACK) ×3 IMPLANT
TUNNELER SHEATH ON-Q 11GX8 DSP (PAIN MANAGEMENT) ×3 IMPLANT
WATER STERILE IRR 1000ML POUR (IV SOLUTION) ×6 IMPLANT

## 2016-11-29 NOTE — Progress Notes (Signed)
Patient transferred to OR 36 after report given. No distress noted. Will transfer care at this time.

## 2016-11-29 NOTE — Brief Op Note (Signed)
11/25/2016 - 11/29/2016  6:43 PM  PATIENT:  Christine Santos  71 y.o. female  PRE-OPERATIVE DIAGNOSIS:  Left Empyema  POST-OPERATIVE DIAGNOSIS:  Left Empyema  PROCEDURE:  Procedure(s) with comments: VIDEO ASSISTED THORACOSCOPY (VATS)/EMPYEMA (Left) - VIDEO ASSISTED THORACOSCOPY (VATS)/EMPYEMA  SURGEON:  Surgeon(s) and Role:    [name], MD - Primary  PHYSICIAN ASSISTANT: WAYNE GOLD PA-C  ANESTHESIA:   general  EBL:  75 mL   BLOOD ADMINISTERED:none  DRAINS: 2  Chest Tube(s) in the LEFT HEMITHORAX   LOCAL MEDICATIONS USED:  ON Q PLACED  SPECIMEN:  Source of Specimen:  PLEURAL PEEL   DISPOSITION OF SPECIMEN:  PATH/CYTOLOGY/MICROBIOLOGY  COUNTS:  YES  TOURNIQUET:  * No tourniquets in log *  DICTATION: .Other Dictation: Dictation Number PENDING  PLAN OF CARE: Admit to inpatient   PATIENT DISPOSITION:  ICU - intubated and hemodynamically stable.   Delay start of Pharmacological VTE agent (>24hrs) due to surgical blood loss or risk of bleeding: yes  COMPLICATIONS: NO KNOWN

## 2016-11-29 NOTE — Progress Notes (Signed)
Pre Procedure note for inpatients:   Christine Santos has been scheduled for Procedure(s): VIDEO ASSISTED THORACOSCOPY (VATS)/EMPYEMA (Left) today. The various methods of treatment have been discussed with the patient. After consideration of the risks, benefits and treatment options the patient has consented to the planned procedure.   The patient has been seen and labs reviewed. There are no changes in the patient's condition to prevent proceeding with the planned procedure today.  Recent labs:  Lab Results  Component Value Date   WBC 15.5 (H) 11/29/2016   HGB 9.3 (L) 11/29/2016   HCT 27.1 (L) 11/29/2016   PLT 246 11/29/2016   GLUCOSE 164 (H) 11/29/2016   CHOL 169 09/04/2015   TRIG 188 (H) 09/04/2015   HDL 32 (L) 09/04/2015   LDLCALC 99 09/04/2015   ALT 37 11/28/2016   AST 52 (H) 11/28/2016   NA 132 (L) 11/29/2016   K 4.4 11/29/2016   CL 95 (L) 11/29/2016   CREATININE 6.54 (H) 11/29/2016   BUN 136 (H) 11/29/2016   CO2 21 (L) 11/29/2016   TSH 3.397 09/04/2015   INR 1.33 11/28/2016   HGBA1C 6.0 07/06/2016    Len Childs, MD 11/29/2016 3:23 PM

## 2016-11-29 NOTE — Transfer of Care (Signed)
Immediate Anesthesia Transfer of Care Note  Patient: Christine Santos  Procedure(s) Performed: VIDEO ASSISTED THORACOSCOPY (VATS)/EMPYEMA (Left Chest)  Patient Location: SICU  Anesthesia Type:General  Level of Consciousness: sedated, unresponsive and Patient remains intubated per anesthesia plan  Airway & Oxygen Therapy: Patient remains intubated per anesthesia plan and Patient placed on Ventilator (see vital sign flow sheet for setting)  Post-op Assessment: Report given to RN and Post -op Vital signs reviewed and stable  Post vital signs: Reviewed and stable  Last Vitals:  Vitals:   11/29/16 1209 11/29/16 1300  BP:  131/62  Pulse:  93  Resp:  19  Temp: 36.6 C   SpO2:  97%    Last Pain:  Vitals:   11/29/16 1209  TempSrc: Oral  PainSc:          Complications: No apparent anesthesia complications

## 2016-11-29 NOTE — Progress Notes (Signed)
Called e-link and spoke to MD. Updated on patient status, VS (low diastolic pressure), current infusions, and patient also only making ~12ml/hr of urine.  No new orders received. Will continue to monitor and increase neo infusion as needed for goal BP/MAP.

## 2016-11-29 NOTE — Anesthesia Procedure Notes (Signed)
Procedure Name: Intubation Date/Time: 11/29/2016 6:59 PM Performed by: Inda Coke, CRNA Pre-anesthesia Checklist: Patient identified, Emergency Drugs available, Suction available and Patient being monitored Patient Re-evaluated:Patient Re-evaluated prior to induction Oxygen Delivery Method: Circle System Utilized Preoxygenation: Pre-oxygenation with 100% oxygen Induction Type: IV induction Ventilation: Mask ventilation without difficulty Laryngoscope Size: Glidescope Grade View: Grade I Tube type: Oral Tube size: 8.0 mm Number of attempts: 1 Airway Equipment and Method: Stylet and Oral airway Placement Confirmation: ETT inserted through vocal cords under direct vision,  positive ETCO2 and breath sounds checked- equal and bilateral Secured at: 22 cm Tube secured with: Tape Dental Injury: Teeth and Oropharynx as per pre-operative assessment

## 2016-11-29 NOTE — Progress Notes (Signed)
Family Medicine Teaching Service Daily Progress Note Intern Pager: [phone]  Patient name: Christine Santos Medical record number: [id number] Date of birth: 11-09-1945 Age: 71 y.o. Gender: female  Primary Care Provider: [name], MD Consultants: Pulmonology  Code Status: Full   Pt Overview and Major Events to Date:  11/2: Patient admitted for acute hypoxic respiratory failure, treatment started for PNA  11/3: Pulmonology consulted when CT scan chest showed substantial consolidation of left lung with a moderate to large left pleura effusion  11/5 Thorocentesis 11/6 Planned VATS in afternoon  Assessment and Plan: Christine Santos is a 71 y.o. female presenting with cough and dyspnea. PMH is significant for T2DM with peripheral neuropathy, CLL s/p chemotherapy, HTN, CKDVI, CAD s/p CABG, hx CVA in 2017, hx of afib on eliquis.   Pneumonia: Dyspnea improved. On 5 L [place]. S/p Thorocentisis with only 50 mL drainage. TCTS consulted. VATS planned for empyema drainage. Prolactin elevatedx2, Strep pneumo urine antigen positive, WBC improved to 15.6. afebrile. 15 O2 on Venturi mask. O2 Sat 96%.  Lactic acid acid stable @ 1.1 > 0.8. Currently on Heparin for anticoagulation. Will reach out to TCTS on timing of planned surgery to discuss stopping.  - Levaquin 750 mg (11/2- ), azactam (11/5 - )  - xopenox for elevated RR, tessalon perles prn - supplemental O2 to maintain O2 sats >/= 92% - trend CBC;  -pulmonology recommendations appreciated - TCTS recommendations appreciated   AKI on CKD stage 4. Cr worsening to 6.5 on 11/6. Baseline Cr ~3.5.AKI. Pt has decreased urine output. 175 mL and x1 unmeasured. Gentle fluid hydration initiated yesterday by nephrology, but stopped and now on NPO due to procedure today. Decreased paxil dose to decrease risk of urinary retension. Will stop today to see if UOP improves.  - appreciate Nephrology recommendations  - monitor CBC and BMP - avoid nephrotoxic  medications -holding torsemide   Anemia Stable this admission at 9.3. Hgb 10.0 on admit. Hg baseline ~10. Acute drop likely acute phase reaction to PNA. Chronically it is likely due to CKD. On ferrous sulfate at home.  - Follow Hg - hold iron due to PNA  H/o diastolic CHF w/ h/o CAD s/p CABG 1996.Weight slighty up today to 178, from 175 yesterday. Increased metoprolol dosage to 25 mg BID due to elevated SBPs and HR up to 130s. HR now < 110. SBPs stable around  At home taking torsemide daily, alternating doses of 50 mg and 100 mg; metoprolol tartrate 12.5 mg BID; norvasc 10 mg daily; atorvastatin 80 mg daily. Last echo 2017 showed EF of 55-60% with G1DD. Troponin trend: 0.83>0.84>0.67.  - hold torsemide due to AKI.  - Continue metoprolol, norvas, atorvastatin - I/Os - Daily weights   Afib:  HR < 110. Rate control with metoprolol 25 mg BID. Holding elequis,and instead on Heparin. Will contact TCTS on thoughts about stopping heparin. - cont metoprolol - cont on heparin  T2DM CBGs stable. Last a1c 6.0 on 07/06/16, at home on lantus 37U daily with 5U humalog tid with meals. - monitor CBGs qACHS - SSI, lantus 15 U in a.m.  HTN: stable in 120s/60s. Increased metoprolol to 25 BID. At home on norvasc, metoprolol, torsemide - continue metoprolol and norvasc - hold torsemide  HLD at home on fenofibrate and atorvastatin.last lipid panel 09/04/15. LDL 99. Triglycerides 188. - continue home meds  GERDat home on protonix - continue home med  Goutat home on allopurinol  - continue home med  Anxiety at home  on paxil 20 mg daily - stopping due to urinary retention   FEN/GI: heart healthy, carb modified diet; protonix Prophylaxis: SCDs  Disposition: Inpatient due to VATS  Subjective:  Breathing feels better. Using less oxygen today. Planned VATS this afternoon. Pt feels a little nervous but that it is necessary.  Objective: Temp:  [97.1 F (36.2 C)-98.6 F (37 C)] 98.6 F (37  C) (11/06 0334) Pulse Rate:  [53-132] 96 (11/06 0700) Resp:  [16-28] 17 (11/06 0700) BP: (104-151)/(53-117) 127/67 (11/06 0700) SpO2:  [89 %-98 %] 97 % (11/06 0700) Weight:  [178 lb 5.6 oz (80.9 kg)] 178 lb 5.6 oz (80.9 kg) (11/06 0500) Physical Exam: General: elderly female lying in bed, resting in bed, NAD Cardiovascular: Tachycardic irregularly irregular. No murmur appreciated.  Respiratory: Decreased breath sounds on Left lower and middle fields. Comfortable WOB.  Abdomen: soft, NTND  Extremities: No LE edema. Moves all extremities spontaneously.   Laboratory: Recent Labs  Lab 11/28/16 0427 11/28/16 2108 11/29/16 0230  WBC 17.6* 16.2* 15.5*  HGB 9.2* 9.6* 9.3*  HCT 27.1* 27.8* 27.1*  PLT 251 261 246   Recent Labs  Lab 11/25/16 1503  11/28/16 0427 11/28/16 1032 11/28/16 2108 11/29/16 0230  NA 136   < > 132*  --  132* 132*  K 3.6   < > 4.2  --  4.2 4.4  CL 98*   < > 94*  --  93* 95*  CO2 25   < > 21*  --  21* 21*  BUN 65*   < > 120*  --  130* 136*  CREATININE 3.68*   < > 6.20*  --  6.60* 6.54*  CALCIUM 8.9   < > 8.2*  --  8.3* 8.0*  PROT 5.8*  --   --  5.9* 5.7*  --   BILITOT 0.9  --   --   --  0.9  --   ALKPHOS 61  --   --   --  71  --   ALT 24  --   --   --  37  --   AST 36  --   --   --  52*  --   GLUCOSE 160*   < > 135*  --  200* 164*   < > = values in this interval not displayed.     Imaging/Diagnostic Tests: Dg Chest 2 View  Result Date: 11/25/2016 CLINICAL DATA:  71 y/o  F; chest pain with cough and hemoptysis. EXAM: CHEST  2 VIEW COMPARISON:  04/07/2014 PET-CT.  01/10/2009 chest radiograph. FINDINGS: Mild cardiomegaly. Aortic atherosclerosis with calcification. Status post CABG. Median sternotomy wires are aligned. Extensive consolidations throughout the left mid and lower lung zones. Small to moderate left effusion. Right upper quadrant cholecystectomy clips. Left humerus rotator cuff repair anchor. Multilevel degenerative changes of the spine.  IMPRESSION: Extensive consolidation throughout left lung and small to moderate left effusion probably representing pneumonia. Follow-up to resolution is recommended to exclude underlying malignancy. Electronically Signed   By: Kristine Garbe M.D.   On: 11/25/2016 15:23   Ct Chest Wo Contrast  Result Date: 11/26/2016 CLINICAL DATA:  Patient with respiratory failure. EXAM: CT CHEST WITHOUT CONTRAST TECHNIQUE: Multidetector CT imaging of the chest was performed following the standard protocol without IV contrast. COMPARISON:  Chest radiograph 11/25/2016. FINDINGS: Cardiovascular: Heart is enlarged. No pericardial effusion. Coronary arterial vascular calcifications. Thoracic aortic vascular calcifications. Stable fluid density structure along the anterior right aspect of the mediastinum (image 75;  series 3). Mediastinum/Nodes: Multiple prominent subcentimeter mediastinal lymph nodes are visualized. The esophagus is unremarkable. Abarrent right subclavian vein. Lungs/Pleura: Bowing of the posterior trachea secondary to expiratory phase imaging. There is a a moderate complex left pleural effusion which appears partially loculated. There is extensive consolidation throughout the left lung. Minimal aerated left upper lobe. Patchy ground-glass and consolidative opacities demonstrated throughout the right upper, right middle and right lower lobes. Trace right pleural fluid. No pneumothorax. Upper Abdomen: Status post cholecystectomy.  No acute process. Musculoskeletal: Thoracic spine degenerative changes. No aggressive or acute appearing osseous lesions. IMPRESSION: 1. There is extensive consolidation throughout the left lung which may be secondary to pneumonia in the appropriate clinical setting. There is a complex appearing loculated moderate left pleural effusion. Recommend radiographic followup to ensure resolution. 2. Patchy ground-glass and consolidative opacities scattered throughout the right lung which  may be secondary to an infectious or inflammatory process. Recommend attention on follow-up. 3. Multiple prominent subcentimeter superior mediastinal lymph nodes are nonspecific however likely reactive in etiology. Recommend attention on follow-up. 4. Cardiomegaly.  Coronary arterial atherosclerosis. 5. Aortic Atherosclerosis (ICD10-I70.0). Electronically Signed   By: Lovey Newcomer M.D.   On: 11/26/2016 14:52   Dg Chest Port 1 View  Result Date: 11/28/2016 CLINICAL DATA:  71 year old female status post left side thoracentesis earlier this hour. EXAM: PORTABLE CHEST 1 VIEW COMPARISON:  0545 hours today and earlier. FINDINGS: Portable AP upright view at 1032 hours. No left pneumothorax. Continued subtotal opacification of the left hemithorax, ventilation not significantly changed. Visible mediastinal contours are stable. Cardiomegaly. Prior CABG. Left chest cardiac event recorder. Right lung appears stable and clear when allowing for portable technique. Negative visible bowel gas pattern. IMPRESSION: No pneumothorax or adverse features status post left side thoracentesis. Electronically Signed   By: Genevie Ann M.D.   On: 11/28/2016 10:53   Dg Chest Port 1 View  Result Date: 11/28/2016 CLINICAL DATA:  Shortness of breath.  Pneumonia EXAM: PORTABLE CHEST 1 VIEW COMPARISON:  Chest CT 2 days ago. FINDINGS: Extensive opacification of the left lung which is a combination of loculated pleural fluid and atelectasis/ consolidation. Cardiomegaly. Status post CABG. There is an implantable loop recorder. The right lung remains clear. IMPRESSION: 1. History of pneumonia with unchanged extensive left lung consolidation and complex pleural effusion. 2. Cardiomegaly. Electronically Signed   By: Monte Fantasia M.D.   On: 11/28/2016 08:04    Bonnita Hollow, MD 11/29/2016, 7:05 AM PGY-1, [facility] Intern pager: [phone], text pages welcome

## 2016-11-29 NOTE — Progress Notes (Signed)
Subjective: Feels much improved in regards to breathing. Has had some urine output this morning. No complaints. Awaiting VATS  Objective: Vital signs in last 24 hours: Temp:  [97.1 F (36.2 C)-98.6 F (37 C)] 98 F (36.7 C) (11/06 0807) Pulse Rate:  [53-132] 95 (11/06 1000) Resp:  [16-28] 18 (11/06 1000) BP: (104-148)/(53-117) 118/81 (11/06 1000) SpO2:  [89 %-97 %] 96 % (11/06 1000) Weight:  [178 lb 5.6 oz (80.9 kg)] 178 lb 5.6 oz (80.9 kg) (11/06 0500) Weight change: 2 lb 10.3 oz (1.2 kg)  Intake/Output from previous day: 11/05 0701 - 11/06 0700 In: 2000 [P.O.:360; I.V.:1540; IV Piggyback:100] Out: 175 [Urine:175] Intake/Output this shift: Total I/O In: 225 [I.V.:225] Out: -   General appearance: alert, cooperative, no distress and nasal cannula in place Resp: diminished breath sounds base - bl  and bilaterally and normal work of breathing Cardio: irregularly irregular rhythm GI: soft, non-tender; bowel sounds normal; no masses,  no organomegaly Extremities: edema trace and SCDs in place Skin: Skin color, texture, turgor normal. No rashes or lesions Neurologic: Grossly normal.No asterixis   Lab Results: Recent Labs    11/28/16 2108 11/29/16 0230  WBC 16.2* 15.5*  HGB 9.6* 9.3*  HCT 27.8* 27.1*  PLT 261 246   BMET:  Recent Labs    11/28/16 2108 11/29/16 0230  NA 132* 132*  K 4.2 4.4  CL 93* 95*  CO2 21* 21*  GLUCOSE 200* 164*  BUN 130* 136*  CREATININE 6.60* 6.54*  CALCIUM 8.3* 8.0*   No results for input(s): PTH in the last 72 hours. Iron Studies: No results for input(s): IRON, TIBC, TRANSFERRIN, FERRITIN in the last 72 hours.  Studies/Results: Dg Chest Port 1 View  Result Date: 11/29/2016 CLINICAL DATA:  Shortness of breath EXAM: PORTABLE CHEST 1 VIEW COMPARISON:  Portable chest x-ray of 11/28/2016, and CT chest of 11/26/2016 FINDINGS: There is little change in opacity at the left lung base consistent with atelectasis, pneumonia, and apparently  loculated left pleural effusion. The right lung is grossly clear by chest x-ray. Mild cardiomegaly is stable. Median sternotomy sutures are noted from prior CABG. No acute bony abnormality is seen. The loop recorder is no longer seen. IMPRESSION: 1. Persistent opacity at the left lung base consistent with atelectasis, pneumonia, in apparently loculated left pleural effusion. 2. Stable cardiomegaly Electronically Signed   By: Ivar Drape M.D.   On: 11/29/2016 10:33   Dg Chest Port 1 View  Result Date: 11/28/2016 CLINICAL DATA:  71 year old female status post left side thoracentesis earlier this hour. EXAM: PORTABLE CHEST 1 VIEW COMPARISON:  0545 hours today and earlier. FINDINGS: Portable AP upright view at 1032 hours. No left pneumothorax. Continued subtotal opacification of the left hemithorax, ventilation not significantly changed. Visible mediastinal contours are stable. Cardiomegaly. Prior CABG. Left chest cardiac event recorder. Right lung appears stable and clear when allowing for portable technique. Negative visible bowel gas pattern. IMPRESSION: No pneumothorax or adverse features status post left side thoracentesis. Electronically Signed   By: Genevie Ann M.D.   On: 11/28/2016 10:53   Dg Chest Port 1 View  Result Date: 11/28/2016 CLINICAL DATA:  Shortness of breath.  Pneumonia EXAM: PORTABLE CHEST 1 VIEW COMPARISON:  Chest CT 2 days ago. FINDINGS: Extensive opacification of the left lung which is a combination of loculated pleural fluid and atelectasis/ consolidation. Cardiomegaly. Status post CABG. There is an implantable loop recorder. The right lung remains clear. IMPRESSION: 1. History of pneumonia with unchanged extensive left lung  consolidation and complex pleural effusion. 2. Cardiomegaly. Electronically Signed   By: Monte Fantasia M.D.   On: 11/28/2016 08:04    Scheduled: . amLODipine  10 mg Oral Daily  . atorvastatin  80 mg Oral q1800  . benzonatate  100 mg Oral TID  .  dextromethorphan-guaiFENesin  1 tablet Oral BID  . fenofibrate  54 mg Oral Daily  . hydrALAZINE  50 mg Oral TID  . insulin aspart  0-9 Units Subcutaneous TID WC  . insulin glargine  15 Units Subcutaneous q morning - 10a  . metoCLOPramide (REGLAN) injection  5 mg Intravenous Q6H  . metoprolol tartrate  25 mg Oral BID  . pantoprazole  40 mg Oral Daily    Assessment/Plan: Pt is a 71 y.o. yo female with a PMHX of CKDIV, CLL s/p chemo, T2DM, HTN, CAD s/p CABG, prior CVA in 2017, afib on eliquis, was admitted to [facility] on 11/25/2016 with pneumonia and pleural effusion. She has had worsening kidney function during her stay.    1. Acute on chronic kidney disease- Creatinine stable from yesterday at 6.54, BUN stable at 136. Tolerating fluids well, does not appear to be third spacing on exam -continue to hold torsemide -continue gentle fluids -monitor renal function closely -daily weights, strict I/Os  2. Pneumonia w/ complex loculated left pleural effusion- continue antibiotics, s/p thoracentesis, scheduled for VATS this afternoon  3. Afib w/ RVR- continue metoprolol, hold eliquis 2/2 hemoptysis  4. T2DM- per primary  5. HTN- stable, continue hydralazine and norvasc  Dispo: pending clinical improvement     LOS: 4 days   Steve Rattler 11/29/2016,11:13 AM

## 2016-11-29 NOTE — Anesthesia Procedure Notes (Addendum)
Arterial Line Insertion Start/End11/06/2016 2:00 PM, 11/29/2016 2:17 PM Performed by: Josephine Igo, CRNA, CRNA  Patient location: Pre-op. Preanesthetic checklist: patient identified, IV checked, risks and benefits discussed, surgical consent and monitors and equipment checked Lidocaine 1% used for infiltration Right, radial was placed Catheter size: 20 G Hand hygiene performed  and maximum sterile barriers used  Allen's test indicative of satisfactory collateral circulation Attempts: 1 Procedure performed without using ultrasound guided technique. Following insertion, dressing applied and Biopatch. Post procedure assessment: decreased circulation  Patient tolerated the procedure well with no immediate complications.

## 2016-11-29 NOTE — Discharge Summary (Signed)
[facility] Discharge Summary  Patient name: Christine Santos Medical record number: [id number] Date of birth: 1945/06/21 Age: 71 y.o. Gender: female Date of Admission: 11/25/2016  Date of Discharge: 12/07/2016 Admitting Physician: Alveda Reasons, MD  Primary Care Provider: [name], MD Consultants: CCM/Pulmonology, Nephrology, TCTS  Indication for Hospitalization: Pneumonia  Discharge Diagnoses/Problem List:  Left Lung Pneumonia CLL CHF AKI on CKD V Atrial Fibrillation CVA CAD Anemia HTN HLD GERD Gout Anxiety Sacral decubitus ulcer  Disposition: Home with [facility] PT  Discharge Condition: Improved  Discharge Exam:  General: NAD, resting alert in chair Cardiovascular: regular rate, regular rhythm. No murmur appreciated.  Respiratory: CTA bilaterally, improved air movement in left lung Abdomen: soft, NTND   Brief Hospital Course:   Pneumonia Patient has left lobe pneumonia with empyema. She tested positive for streptococcus urine antigen. She was started on broad spectrum IV levofloxacin and azactam. She received a thoracentesis on 11/5 with minimal output. She was later received VATS with chest tube placement. Patient was extubated the following day after VATS without any complications. Chest tubes were removed a few days later when drainage was minimal. Wound cultures grew Propionic bacterium sp. This likely indicated a polymicrobial infection. Patient was transitioned to PO levofloxacin and Flagyl for coverage of anaerobic bacteria. Patient continued to improved requiring no oxygen by discharge. Therapy goals are for a total of 3 weeks of Levaquin and 2 week of flagyl. Patient was discharge with the proper amount of antibiotics to complete this. Patient had stitches on left chest from VATS procedure. Patient is to follow up with TCTS to have stiches removed. This appointment is already scheduled and listed below. She was discharged home with  home health PT/OT because patient refused to go to SNF.   AKI on CKD stage 4. Patient developed a significant increase in Cr up to 7.3 from baseline of 3.5. Nephrology was consulted and recommended gentle fluid hydration. Cr did eventually trend back to baseline at 3.4. Patient was tolerating PO adequately and made appropriative UOP.   Anemia Patient has baseline anemia around 10.0. During her stay, patient levels fluctuated between 7.7 -8.1. 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 Oral iron was held due to infection during stay. It was restarted on discharge. Patient may be a candidate for erythropoietin. There are some relative contraindications for starting this therapy given that the patient has history of CLL and CVA.   Sacral decubitus ulcer Patient has small healing decubitus ulcer on sacrum. Wound care was consulted and it was managed appropriately.   Issues for Follow Up:  1. Antibiotics - Please ensure patient completes full course of antibiotics 2. Anemia - Consider nephrology referral for discussion of starting erythropoietin 3. Torsemide - Patient was discharged on decreased dosage of torsemide due to AKI. Consider titrating dose as needed.  4. Sacral ulcer - please ensure continued healing and wound care of sacral ulcer.   Significant Procedures:  11/5 Thorocentisis of left lung  11/6 VATS on 11/6  Significant Labs and Imaging:  Recent Labs  Lab 12/05/16 0436 12/05/16 1438 12/06/16 0300 12/07/16 0707  WBC 15.1*  --  14.9* 15.4*  HGB 7.6* 8.1* 7.7* 8.1*  HCT 22.5* 24.1* 22.7* 24.6*  PLT 258  --  264 255   Recent Labs  Lab 12/01/16 0402 12/02/16 0355 12/03/16 0407 12/04/16 0218 12/05/16 0436 12/06/16 0300 12/07/16 0707  NA 131* 133* 135 137 137 136 141  K 4.4 4.4 4.3 4.5  4.1 4.2 4.1  CL 98* 100* 103 105 106 108 111  CO2 19* 20* 20* 19* 19* 18* 20*  GLUCOSE 163* 143* 220* 160* 182* 288* 235*  BUN 154* 165* 170* 170* 161* 150* 128*  CREATININE  7.35* 6.71* 6.14* 5.53* 4.69* 3.99* 3.34*  CALCIUM 7.6* 7.9* 8.2* 8.4* 8.4* 8.1* 8.6*  MG  --  2.2  --   --   --   --   --   PHOS  --  8.6*  --   --   --   --   --   ALKPHOS 81  --   --   --   --  102 78  AST 42*  --   --   --   --  28 27  ALT 32  --   --   --   --  28 27  ALBUMIN 1.9*  --   --   --   --  1.9* 2.1*    Results/Tests Pending at Time of Discharge:   Discharge Medications:  Allergies as of 12/07/2016      Reactions   Other    Darvocet   Penicillins    Amoxicillin Rash   Has patient had a PCN reaction causing immediate rash, facial/tongue/throat swelling, SOB or lightheadedness with hypotension: No Has patient had a PCN reaction causing severe rash involving mucus membranes or skin necrosis: No Has patient had a PCN reaction that required hospitalization: No Has patient had a PCN reaction occurring within the last 10 years: No If all of the above answers are "NO", then may proceed with Cephalosporin use.   Cephalexin Rash   Codeine Phosphate Rash      Medication List    STOP taking these medications   LANTUS SOLOSTAR 100 UNIT/ML Solostar Pen Generic drug:  Insulin Glargine Replaced by:  insulin glargine 100 UNIT/ML injection     TAKE these medications   acidophilus Caps capsule Take 2 capsules daily by mouth. Start taking on:  12/08/2016   allopurinol 100 MG tablet Commonly known as:  ZYLOPRIM TAKE 1 TABLET(100 MG) BY MOUTH DAILY   amLODipine 5 MG tablet Commonly known as:  NORVASC TAKE 1 TABLET BY MOUTH DAILY What changed:    medication strength  See the new instructions.   atorvastatin 80 MG tablet Commonly known as:  LIPITOR TAKE 1 TABLET BY MOUTH DAILY What changed:    how much to take  how to take this  when to take this   carboxymethylcellulose 0.5 % Soln Commonly known as:  REFRESH PLUS 1 drop 3 (three) times daily as needed.   ELIQUIS 5 MG Tabs tablet Generic drug:  apixaban TAKE 1 TABLET(5 MG) BY MOUTH TWICE DAILY   feeding  supplement Liqd Take 1 Container 3 (three) times daily between meals by mouth.   fenofibrate 48 MG tablet Commonly known as:  TRICOR Take 1 tablet (48 mg total) by mouth daily. What changed:  when to take this   ferrous sulfate 325 (65 FE) MG tablet Take 1 tablet (325 mg total) by mouth daily with breakfast.   GNP GARLIC EXTRACT PO Take 1 tablet by mouth daily.   HUMALOG KWIKPEN 100 UNIT/ML KiwkPen Generic drug:  insulin lispro Inject 5 Units as directed 3 (three) times daily.   hydrALAZINE 50 MG tablet Commonly known as:  APRESOLINE Take 1 tablet (50 mg total) by mouth 3 (three) times daily.   insulin glargine 100 UNIT/ML injection Commonly known as:  LANTUS  Inject 0.16 mLs (16 Units total) at bedtime into the skin. Replaces:  LANTUS SOLOSTAR 100 UNIT/ML Solostar Pen   levofloxacin 500 MG tablet Commonly known as:  LEVAQUIN Take 1 tablet (500 mg total) every other day for 8 days by mouth. Start taking on:  12/08/2016   Melatonin 10 MG Caps Take 10 mg by mouth at bedtime as needed (sleep).   metoprolol tartrate 25 MG tablet Commonly known as:  LOPRESSOR TAKE 1 TABLET BY MOUTH TWICE DAILY What changed:  how much to take   metroNIDAZOLE 500 MG tablet Commonly known as:  FLAGYL Take 1 tablet (500 mg total) every 8 (eight) hours for 12 days by mouth.   multivitamins ther. w/minerals Tabs tablet Take 1 tablet by mouth daily.   omega-3 acid ethyl esters 1 g capsule Commonly known as:  LOVAZA Take 2 g by mouth 2 (two) times daily.   pantoprazole 40 MG tablet Commonly known as:  PROTONIX TAKE 1 TABLET BY MOUTH DAILY What changed:    how much to take  how to take this  when to take this   PARoxetine 20 MG tablet Commonly known as:  PAXIL TAKE 1 TABLET BY MOUTH EVERY MORNING What changed:    how much to take  how to take this  when to take this   torsemide 10 MG tablet Commonly known as:  DEMADEX Take 5 tablets (50 mg total) daily by mouth. Start  taking on:  12/08/2016 What changed:    medication strength  how much to take   traMADol 50 MG tablet Commonly known as:  ULTRAM TAKE 1 TABLET BY MOUTH FOUR TIMES DAILY What changed:    how much to take  how to take this  when to take this   vitamin C 100 MG tablet Take 100 mg by mouth daily.            Durable Medical Equipment  (From admission, onward)        Start     Ordered   12/07/16 1507  DME 3-in-1  Once     12/07/16 1510   12/07/16 1224  For home use only DME Bedside commode  Once    Question:  Patient needs a bedside commode to treat with the following condition  Answer:  Weakness   12/07/16 1224      Discharge Instructions: Please refer to Patient Instructions section of EMR for full details.  Patient was counseled important signs and symptoms that should prompt return to medical care, changes in medications, dietary instructions, activity restrictions, and follow up appointments.   Follow-Up Appointments: [facility] Follow up.   Why:  Bedside commode will be delivered to room prior to discharge Contact information: [place] [address] [phone]        [facility] Follow up.   Specialty:  [place] Why:  home health service arranged, office will call and set up home visits Contact information: [place] [address] [phone]        Mercy Riding, MD. Go on 12/13/2016.   Specialty:  Family Medicine Why:  Your appointment is at 8:30AM.Please arrive 15 minute early. Contact information: [place] [address] [phone]        [name], MD. Go on 01/04/2017.   Specialty:  Cardiothoracic Surgery Why:  Appointment at 11:30AM. Contact information: [place]  [address] [place] [address] [phone]           Bonnita Hollow, MD 12/07/2016, 7:18 PM PGY-1, [place]

## 2016-11-29 NOTE — Anesthesia Preprocedure Evaluation (Addendum)
Anesthesia Evaluation  Patient identified by MRN, date of birth, ID band Patient awake    Reviewed: Allergy & Precautions, NPO status , Patient's Chart, lab work & pertinent test results  History of Anesthesia Complications Negative for: history of anesthetic complications  Airway Mallampati: II  TM Distance: >3 FB Neck ROM: Full    Dental  (+) Dental Advisory Given, Edentulous Upper, Poor Dentition   Pulmonary shortness of breath,  Severe dyspnea for 2-3 weeks, L empyema    + decreased breath sounds      Cardiovascular hypertension, Pt. on medications and Pt. 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), Insulin Dependent  Renal/GU Renal InsufficiencyRenal disease (creat 6.54, K+ 4.4)     Musculoskeletal   Abdominal   Peds  Hematology  (+) Blood dyscrasia (CLL, Hb 9.3), anemia , eliquis   Anesthesia Other Findings   Reproductive/Obstetrics                            Anesthesia Physical Anesthesia Plan  ASA: III  Anesthesia Plan: General   Post-op Pain Management:    Induction: Intravenous  PONV Risk Score and Plan: 4 or greater and Ondansetron, Dexamethasone, Midazolam and Treatment may vary due to age or medical condition  Airway Management Planned: Double Lumen EBT and Oral ETT  Additional Equipment: Arterial line, CVP and Ultrasound Guidance Line Placement  Intra-op Plan:   Post-operative Plan: Possible Post-op intubation/ventilation  Informed Consent: I have reviewed the patients History and Physical, chart, labs and discussed the procedure including the risks, benefits and alternatives for the proposed  anesthesia with the patient or authorized representative who has indicated his/her understanding and acceptance.   Dental advisory given  Plan Discussed with: CRNA and Surgeon  Anesthesia Plan Comments: (Plan routine monitors, A line, Central line, GETA with DLT and possible post op ventilation)        Anesthesia Quick Evaluation

## 2016-11-29 NOTE — Evaluation (Signed)
Physical Therapy Evaluation Patient Details Name: Christine Santos MRN: [id number] DOB: 1946-01-17 Today's Date: 11/29/2016   History of Present Illness  Pt adm with PNA and lt empyema. Pt to undergo VATS 11/6 PM. PMH - CLL, CHF, CKD Stage V, Afib, CVA, CAD  Clinical Impression  Pt admitted with above diagnosis and presents to PT with functional limitations due to deficits listed below (See PT problem list). Pt needs skilled PT to maximize independence and safety. DC recommendations to be made after pt has VATS and is reassessed.      Follow Up Recommendations Other (comment)(To be determined after seen after surgery)    Equipment Recommendations  None recommended by PT    Recommendations for Other Services       Precautions / Restrictions Precautions Precautions: Fall Restrictions Weight Bearing Restrictions: No      Mobility  Bed Mobility Overal bed mobility: Needs Assistance Bed Mobility: Supine to Sit     Supine to sit: Supervision     General bed mobility comments: for lines  Transfers Overall transfer level: Needs assistance Equipment used: Rolling walker (2 wheeled) Transfers: Sit to/from Stand Sit to Stand: Min guard         General transfer comment: Assist for balance and safety  Ambulation/Gait Ambulation/Gait assistance: Min assist Ambulation Distance (Feet): 70 Feet Assistive device: Rolling walker (2 wheeled) Gait Pattern/deviations: Step-through pattern;Decreased stride length;Drifts right/left Gait velocity: decr Gait velocity interpretation: Below normal speed for age/gender General Gait Details: Slightly unsteady gait. Amb on 6L of O2 with SpO2 >91%. Pt reported pulling pain in lt ribs, chest  Stairs            Wheelchair Mobility    Modified Rankin (Stroke Patients Only)       Balance Overall balance assessment: Needs assistance Sitting-balance support: No upper extremity supported;Feet supported Sitting balance-Leahy Scale:  Good     Standing balance support: Single extremity supported Standing balance-Leahy Scale: Poor Standing balance comment: UE support and min guard for static standing                             Pertinent Vitals/Pain Pain Assessment: Faces Faces Pain Scale: Hurts little more Pain Location: lt chest Pain Descriptors / Indicators: (Pulling) Pain Intervention(s): Limited activity within patient's tolerance;Repositioned    Home Living Family/patient expects to be discharged to:: Private residence Living Arrangements: Spouse/significant other(Friend "Abe People") Available Help at Discharge: Friend(s);Available 24 hours/day Type of Home: House Home Access: Stairs to enter Entrance Stairs-Rails: Right Entrance Stairs-Number of Steps: 2 Home Layout: One level Home Equipment: Walker - 2 wheels;Cane - quad;Cane - single point;Shower seat;Grab bars - tub/shower;Transport chair Additional Comments: Lives with her friend Abe People who is visually impaired.    Prior Function Level of Independence: Independent               Hand Dominance        Extremity/Trunk Assessment   Upper Extremity Assessment Upper Extremity Assessment: Defer to OT evaluation    Lower Extremity Assessment Lower Extremity Assessment: Generalized weakness       Communication   Communication: No difficulties  Cognition Arousal/Alertness: Awake/alert Behavior During Therapy: WFL for tasks assessed/performed Overall Cognitive Status: Within Functional Limits for tasks assessed  General Comments      Exercises     Assessment/Plan    PT Assessment Patient needs continued PT services  PT Problem List Decreased strength;Decreased activity tolerance;Decreased balance;Decreased mobility;Pain       PT Treatment Interventions DME instruction;Gait training;Stair training;Functional mobility training;Therapeutic activities;Therapeutic  exercise;Balance training;Patient/family education    PT Goals (Current goals can be found in the Care Plan section)  Acute Rehab PT Goals Patient Stated Goal: Return home PT Goal Formulation: With patient Time For Goal Achievement: 12/13/16 Potential to Achieve Goals: Good    Frequency Min 3X/week   Barriers to discharge        Co-evaluation               AM-PAC PT "6 Clicks" Daily Activity  Outcome Measure Difficulty turning over in bed (including adjusting bedclothes, sheets and blankets)?: A Little Difficulty moving from lying on back to sitting on the side of the bed? : A Little Difficulty sitting down on and standing up from a chair with arms (e.g., wheelchair, bedside commode, etc,.)?: Unable Help needed moving to and from a bed to chair (including a wheelchair)?: A Little Help needed walking in hospital room?: A Little Help needed climbing 3-5 steps with a railing? : A Little 6 Click Score: 16    End of Session Equipment Utilized During Treatment: Gait belt;Oxygen Activity Tolerance: Patient limited by fatigue;Patient limited by pain Patient left: in chair;with call bell/phone within reach;with family/visitor present Nurse Communication: Mobility status;Other (comment)(pain) PT Visit Diagnosis: Unsteadiness on feet (R26.81);Muscle weakness (generalized) (M62.81)    Time: 1022-1050 PT Time Calculation (min) (ACUTE ONLY): 28 min   Charges:   PT Evaluation $PT Eval Moderate Complexity: 1 Mod PT Treatments $Gait Training: 8-22 mins   PT G Codes:        [facility] PT [place] 11/29/2016, 11:11 AM

## 2016-11-29 NOTE — Anesthesia Postprocedure Evaluation (Signed)
Anesthesia Post Note  Patient: Christine Santos  Procedure(s) Performed: VIDEO ASSISTED THORACOSCOPY (VATS)/EMPYEMA (Left Chest)     Patient location during evaluation: SICU Anesthesia Type: General Level of consciousness: patient remains intubated per anesthesia plan Pain management: pain level controlled Vital Signs Assessment: post-procedure vital signs reviewed and stable Respiratory status: patient remains intubated per anesthesia plan and patient on ventilator - see flowsheet for VS Cardiovascular status: blood pressure returned to baseline and stable Anesthetic complications: no    Last Vitals:  Vitals:   11/29/16 1209 11/29/16 1300  BP:  131/62  Pulse:  93  Resp:  19  Temp: 36.6 C   SpO2:  97%    Last Pain:  Vitals:   11/29/16 1209  TempSrc: Oral  PainSc:                  Anahli Arvanitis DAVID

## 2016-11-29 NOTE — Anesthesia Procedure Notes (Signed)
Central Venous Catheter Insertion Performed by: Annye Asa, MD, anesthesiologist Start/End11/06/2016 3:41 PM, 11/29/2016 3:54 PM Patient location: Pre-op. Preanesthetic checklist: patient identified, IV checked, site marked, risks and benefits discussed, surgical consent, monitors and equipment checked, pre-op evaluation, timeout performed and anesthesia consent Position: supine Lidocaine 1% used for infiltration and patient sedated Hand hygiene performed , maximum sterile barriers used  and Seldinger technique used Catheter size: 8 Fr Total catheter length 16. Central line was placed.Double lumen Procedure performed using ultrasound guided technique. Ultrasound Notes:anatomy identified, needle tip was noted to be adjacent to the nerve/plexus identified, no ultrasound evidence of intravascular and/or intraneural injection and image(s) printed for medical record Attempts: 1 Following insertion, Biopatch, dressing applied and line sutured. Post procedure assessment: blood return through all ports, free fluid flow and no air  Patient tolerated the procedure well with no immediate complications. Additional procedure comments: CVP: Timeout, sterile prep, drape, FBP R neck. Supine position.  1% lido local, finder and trocar LIJ 1st pass with US guidance.  2 lumen placed over J wire. Biopatch and sterile dressing on.  Patient tolerated well.  VSS.  Jenita Seashore, MD.

## 2016-11-29 NOTE — Anesthesia Procedure Notes (Signed)
Procedure Name: Intubation Date/Time: 11/29/2016 4:33 PM Performed by: Inda Coke, CRNA Pre-anesthesia Checklist: Patient identified, Emergency Drugs available, Suction available and Patient being monitored Patient Re-evaluated:Patient Re-evaluated prior to induction Oxygen Delivery Method: Circle System Utilized Preoxygenation: Pre-oxygenation with 100% oxygen Induction Type: IV induction Ventilation: Mask ventilation without difficulty Laryngoscope Size: Mac and 3 Grade View: Grade I Tube type: Oral Endobronchial tube: Left, Double lumen EBT, EBT position confirmed by auscultation and EBT position confirmed by fiberoptic bronchoscope and 37 Fr Number of attempts: 1 Airway Equipment and Method: Stylet and Oral airway Placement Confirmation: ETT inserted through vocal cords under direct vision,  positive ETCO2 and breath sounds checked- equal and bilateral Secured at: 29 cm Tube secured with: Tape Dental Injury: Teeth and Oropharynx as per pre-operative assessment

## 2016-11-29 NOTE — Progress Notes (Signed)
PULMONARY / CRITICAL CARE MEDICINE   Name: Christine Santos MRN: [id number] DOB: 01/06/1946    ADMISSION DATE:  11/25/2016  HISTORY OF PRESENT ILLNESS:   9yoF with hx CLL, CHF, CKD Stage V, Afib (on eliquis), CVA, CAD, who was admitted 11/25/16 after 2 days of SOB, Productive cough, and small volume hemoptysis at home. She was treated with Levoflox 11/2-11/3 but was stopped today for unclear reasons. She had been on Eliquis (last dose 11/3 AM) which is now being held. Chest CT showed diffuse dense infiltrates within left lung with loculated appearing moderate sized left pleural effusion.   Subjective: States she is coughing less, and breathing is OK  VITAL SIGNS: BP 127/67   Pulse 96   Temp 98 F (36.7 C) (Oral)   Resp 17   Ht 5' 5.5" (1.664 m)   Wt 178 lb 5.6 oz (80.9 kg)   SpO2 97%   BMI 29.23 kg/m   INTAKE / OUTPUT: I/O last 3 completed shifts: In: 2000 [P.O.:360; I.V.:1540; IV Piggyback:100] Out: 175 [Urine:175]  PHYSICAL EXAMINATION: General: Elderly female, asleep in bed, arouses easily.Nasal oxygen at 5L  Neuro: AAOx3, moving all extremities, appropriate HEENT: OP clear, MM moist, normocephalic, atraumatic Cardiovascular:S1, S2,  Irreg irreg, a-fib per monitor, No RMG, rate 95  Lungs: decreased left, wet-sounding cough no accessory muscle use.  Abdomen: Soft NTND, BS+, obese Musculoskeletal: no LE edema, no obvious deformities or abnormalities Skin: no rashes, no lesions, warm dry and intact  LABS:  BMET Recent Labs  Lab 11/28/16 0427 11/28/16 2108 11/29/16 0230  NA 132* 132* 132*  K 4.2 4.2 4.4  CL 94* 93* 95*  CO2 21* 21* 21*  BUN 120* 130* 136*  CREATININE 6.20* 6.60* 6.54*  GLUCOSE 135* 200* 164*   Electrolytes Recent Labs  Lab 11/28/16 0427 11/28/16 2108 11/29/16 0230  CALCIUM 8.2* 8.3* 8.0*  MG 1.8  --  2.0  PHOS 6.2*  --   --    CBC Recent Labs  Lab 11/28/16 0427 11/28/16 2108 11/29/16 0230  WBC 17.6* 16.2* 15.5*  HGB 9.2* 9.6* 9.3*   HCT 27.1* 27.8* 27.1*  PLT 251 261 246   Coag's Recent Labs  Lab 11/28/16 1343 11/28/16 2108 11/29/16 0230  APTT 43* 134* 71*  INR  --  1.33  --     Sepsis Markers Recent Labs  Lab 11/26/16 1524 11/26/16 1820 11/27/16 0949 11/28/16 0427 11/29/16 0230  LATICACIDVEN 1.0 1.1  --  0.8  --   PROCALCITON  --   --  16.71 14.23 8.63   ABG Recent Labs  Lab 11/26/16 1552 11/28/16 2338  PHART 7.427 7.393  PCO2ART 39.3 35.7  PO2ART 60.6* 64.0*   Liver Enzymes Recent Labs  Lab 11/25/16 1503 11/28/16 0427 11/28/16 2108  AST 36  --  52*  ALT 24  --  37  ALKPHOS 61  --  71  BILITOT 0.9  --  0.9  ALBUMIN 2.7* 2.2* 2.2*   Cardiac Enzymes Recent Labs  Lab 11/25/16 2045 11/26/16 0229 11/26/16 1409  TROPONINI 0.83* 0.84* 0.67*   Glucose Recent Labs  Lab 11/27/16 2107 11/28/16 0314 11/28/16 0759 11/28/16 1246 11/28/16 1634 11/28/16 2141  GLUCAP 132* 131* 136* 141* 125* 196*   STUDIES:  Chest CT (11/26/16):  1. There is extensive consolidation throughout the left lung which may be secondary to pneumonia in the appropriate clinical setting. There is a complex appearing loculated moderate left pleural effusion.  2. Patchy ground-glass and consolidative opacities  scattered throughout the right lung  3. Multiple prominent subcentimeter superior mediastinal lymph nodes are nonspecific however likely reactive in etiology. 4. Cardiomegaly.  Coronary arterial atherosclerosis. 5. Aortic Atherosclerosis (ICD10-I70.0).  Echo 08/2015>> EF 55-60%, Normal LV systolic function; mild LVH; grade 1 diastolic  dysfunction with elevated LV filling pressure  CULTURES: Blood cultures (11/3): no growth Urine strep ag POS Pleural Fluid 11/5>>  ANTIBIOTICS: Levofloxacin 11/2>>11/3 Azactam 11/5>>  LINES/TUBES: PIV's  ASSESSMENT / PLAN: 49yoF with hx CLL, CHF, CKD Stage V, Afib (on eliquis), CVA, CAD, who was admitted 11/25/16 after 2 days of SOB, Productive cough, and small  volume hemoptysis, found to have Dense Left-sided Pneumonia with Loculated parapneumonic effusion (presumably empyema given its appearance), and Acute Hypoxic Respiratory failure requiring 15L O2.   PULMONARY 1. Pneumonia with Loculated Left-sided Parapneumonic effusion; Acute Hypoxic respiratory failure:  - For VATS and drainage L empyema 11/6 per TCTS : surgery best option for long term therapy - thora done 11/5 >> Follow micro - Titrate oxygen as able for saturations> 92%.  - procalcitonin high but down trending, await  sputum culture, ct  Aztreonam and Levoflox. -  MRSA nares neg - Aggressive pulmonary toilet - Continue  Guaifenesin/Dextromethorphan BID;  - Change tessalon perrles from 200mg  BID PRN (hasn't been receiving it) to 100mg  PO TID scheduled. - start IS and Flutter valve.  - CXR daily for now   CARDIOVASCULAR 1. Afib: - holding Elquis (last dose 11/3 AM) - continue metop 12.5mg  PO BID - Tele monitoring - MAP Goal >65 - EKG prn  2. Diastolic CHF: - TTE on 0/17/79 showed grade 1 diastolic dysfunction - Monitor CXR - BNP prn - Strict I&O  RENAL  AKI-on-CKD Stage V: baseline GFR hovers around 15-20, borderline  stage IV to stage V, with worsening of renal function BUN up to 105/ Creatinine up to 5.90. ? 2/2  torsemide given on 11/3 and 11/4. +  history of CHF  - Trend BMET - Avoid nephrotoxic medications - Maintain renal perfusion - Strict I&O, Net + 1.3 L on 11/6   HEMATOLOGIC 1. Hx CLL - Trend CBC - Transfuse for HGB < 7 - Monitor for any obvious signs of bleeding - evaluate for lymphadenopathy or abdominal tenderness ( Splenomegaly) - Monitor for night sweats  Magdalen Spatz, AGACNP-BC [facility] Pager [phone]     11/29/2016, 8:16 AM

## 2016-11-30 ENCOUNTER — Inpatient Hospital Stay (HOSPITAL_COMMUNITY): Payer: Medicare Other

## 2016-11-30 ENCOUNTER — Encounter (HOSPITAL_COMMUNITY): Payer: Self-pay | Admitting: Cardiothoracic Surgery

## 2016-11-30 LAB — GLUCOSE, CAPILLARY
GLUCOSE-CAPILLARY: 241 mg/dL — AB (ref 65–99)
GLUCOSE-CAPILLARY: 220 mg/dL — AB (ref 65–99)
Glucose-Capillary: 169 mg/dL — ABNORMAL HIGH (ref 65–99)
Glucose-Capillary: 236 mg/dL — ABNORMAL HIGH (ref 65–99)

## 2016-11-30 LAB — CBC
HCT: 25.3 % — ABNORMAL LOW (ref 36.0–46.0)
Hemoglobin: 8.6 g/dL — ABNORMAL LOW (ref 12.0–15.0)
MCH: 31 pg (ref 26.0–34.0)
MCHC: 34 g/dL (ref 30.0–36.0)
MCV: 91.3 fL (ref 78.0–100.0)
Platelets: 264 10*3/uL (ref 150–400)
RBC: 2.77 MIL/uL — ABNORMAL LOW (ref 3.87–5.11)
RDW: 14.9 % (ref 11.5–15.5)
WBC: 16.6 10*3/uL — ABNORMAL HIGH (ref 4.0–10.5)

## 2016-11-30 LAB — HEPARIN LEVEL (UNFRACTIONATED): Heparin Unfractionated: 0.59 IU/mL (ref 0.30–0.70)

## 2016-11-30 LAB — BASIC METABOLIC PANEL
ANION GAP: 16 — AB (ref 5–15)
BUN: 146 mg/dL — AB (ref 6–20)
CALCIUM: 7.9 mg/dL — AB (ref 8.9–10.3)
CO2: 19 mmol/L — ABNORMAL LOW (ref 22–32)
CREATININE: 6.92 mg/dL — AB (ref 0.44–1.00)
Chloride: 97 mmol/L — ABNORMAL LOW (ref 101–111)
GFR calc Af Amer: 6 mL/min — ABNORMAL LOW (ref >60)
GFR, EST NON AFRICAN AMERICAN: 5 mL/min — AB (ref >60)
GLUCOSE: 244 mg/dL — AB (ref 65–99)
Potassium: 4.8 mmol/L (ref 3.5–5.1)
Sodium: 132 mmol/L — ABNORMAL LOW (ref 135–145)

## 2016-11-30 LAB — APTT: APTT: 63 s — AB (ref 24–36)

## 2016-11-30 MED ORDER — HEPARIN (PORCINE) IN NACL 100-0.45 UNIT/ML-% IJ SOLN
1200.0000 [IU]/h | INTRAMUSCULAR | Status: DC
  Filled 2016-11-30: qty 250

## 2016-11-30 MED ORDER — HEPARIN (PORCINE) IN NACL 100-0.45 UNIT/ML-% IJ SOLN
850.0000 [IU]/h | INTRAMUSCULAR | Status: DC
  Administered 2016-12-01 – 2016-12-03 (×3): 850 [IU]/h via INTRAVENOUS
  Filled 2016-11-30 (×3): qty 250

## 2016-11-30 MED ORDER — HEPARIN BOLUS VIA INFUSION
4000.0000 [IU] | Freq: Once | INTRAVENOUS | Status: DC
  Filled 2016-11-30: qty 4000

## 2016-11-30 MED ORDER — ONDANSETRON HCL 4 MG/2ML IJ SOLN: 4.0000 mg | Freq: Four times a day (QID) | INTRAMUSCULAR | Status: DC | PRN

## 2016-11-30 MED ORDER — FENTANYL 40 MCG/ML IV SOLN
INTRAVENOUS | Status: DC
  Administered 2016-11-30: 0 ug via INTRAVENOUS
  Administered 2016-11-30: 1000 ug via INTRAVENOUS
  Administered 2016-12-01 (×3): 0 ug via INTRAVENOUS
  Filled 2016-11-30 (×2): qty 25

## 2016-11-30 MED ORDER — HEPARIN (PORCINE) IN NACL 100-0.45 UNIT/ML-% IJ SOLN
850.0000 [IU]/h | INTRAMUSCULAR | Status: AC
  Administered 2016-11-30: 850 [IU]/h via INTRAVENOUS

## 2016-11-30 MED ORDER — CHLORHEXIDINE GLUCONATE CLOTH 2 % EX PADS
6.0000 | MEDICATED_PAD | Freq: Every day | CUTANEOUS | Status: DC
  Administered 2016-11-30 – 2016-12-07 (×8): 6 via TOPICAL

## 2016-11-30 MED ORDER — CHLORHEXIDINE GLUCONATE 0.12% ORAL RINSE (MEDLINE KIT)
15.0000 mL | Freq: Two times a day (BID) | OROMUCOSAL | Status: DC
  Administered 2016-11-30 (×3): 15 mL via OROMUCOSAL

## 2016-11-30 MED ORDER — SODIUM CHLORIDE 0.9% FLUSH
10.0000 mL | Freq: Two times a day (BID) | INTRAVENOUS | Status: DC
  Administered 2016-11-30 – 2016-12-07 (×13): 10 mL

## 2016-11-30 MED ORDER — PANTOPRAZOLE SODIUM 40 MG IV SOLR
40.0000 mg | INTRAVENOUS | Status: DC
  Administered 2016-11-30 – 2016-12-03 (×4): 40 mg via INTRAVENOUS
  Filled 2016-11-30 (×4): qty 40

## 2016-11-30 MED ORDER — SODIUM CHLORIDE 0.9% FLUSH: 9.0000 mL | INTRAVENOUS | Status: DC | PRN

## 2016-11-30 MED ORDER — HEPARIN (PORCINE) IN NACL 100-0.45 UNIT/ML-% IJ SOLN: 10.0000 [IU]/kg/h | INTRAMUSCULAR | Status: DC

## 2016-11-30 MED ORDER — DIPHENHYDRAMINE HCL 50 MG/ML IJ SOLN: 12.5000 mg | Freq: Four times a day (QID) | INTRAMUSCULAR | Status: DC | PRN

## 2016-11-30 MED ORDER — SODIUM CHLORIDE 0.9% FLUSH: 10.0000 mL | INTRAVENOUS | Status: DC | PRN

## 2016-11-30 MED ORDER — NALOXONE HCL 0.4 MG/ML IJ SOLN: 0.4000 mg | INTRAMUSCULAR | Status: DC | PRN

## 2016-11-30 MED ORDER — ORAL CARE MOUTH RINSE
15.0000 mL | Freq: Four times a day (QID) | OROMUCOSAL | Status: DC
  Administered 2016-11-30 (×3): 15 mL via OROMUCOSAL

## 2016-11-30 MED ORDER — DIPHENHYDRAMINE HCL 12.5 MG/5ML PO ELIX: 12.5000 mg | ORAL_SOLUTION | Freq: Four times a day (QID) | ORAL | Status: DC | PRN

## 2016-11-30 NOTE — Progress Notes (Signed)
Subjective: Intubated.  Objective: Vital signs in last 24 hours: Temp:  [97.6 F (36.4 C)-98.3 F (36.8 C)] 98.3 F (36.8 C) (11/07 0816) Pulse Rate:  [72-121] 89 (11/07 0803) Resp:  [10-24] 24 (11/07 0803) BP: (103-131)/(42-81) 114/42 (11/07 0803) SpO2:  [93 %-100 %] 100 % (11/07 0803) Arterial Line BP: (105-130)/(38-46) 121/43 (11/07 0700) FiO2 (%):  [50 %] 50 % (11/07 0803) Weight:  [178 lb 5.6 oz (80.9 kg)-189 lb 13.1 oz (86.1 kg)] 189 lb 13.1 oz (86.1 kg) (11/07 0530) Weight change: 0 lb (0 kg)  Intake/Output from previous day: 11/06 0701 - 11/07 0700 In: 3357.1 [I.V.:3307.1; IV Piggyback:50] Out: 845 [Urine:520; Blood:75; Chest Tube:250] Intake/Output this shift: Total I/O In: 56.9 [I.V.:56.9] Out: 30 [Urine:30]  General appearance: no distress and intubated Resp: diminished breath sounds anterior - bilateral and normal work of breathing Cardio: irregularly irregular rhythm GI: soft, non-tender; bowel sounds normal; no masses,  no organomegaly Extremities: edema trace and SCDs in place Skin: Skin color, texture, turgor normal. No rashes or lesions Neurologic: Mental status: sedated. Intubated.  Lab Results: Recent Labs    11/29/16 0230 11/30/16 0423  WBC 15.5* 16.6*  HGB 9.3* 8.6*  HCT 27.1* 25.3*  PLT 246 264   BMET:  Recent Labs    11/29/16 0230 11/30/16 0423  NA 132* 132*  K 4.4 4.8  CL 95* 97*  CO2 21* 19*  GLUCOSE 164* 244*  BUN 136* 146*  CREATININE 6.54* 6.92*  CALCIUM 8.0* 7.9*   No results for input(s): PTH in the last 72 hours. Iron Studies: No results for input(s): IRON, TIBC, TRANSFERRIN, FERRITIN in the last 72 hours.  Studies/Results: Dg Chest Port 1 View  Result Date: 11/30/2016 CLINICAL DATA:  Hypoxia EXAM: PORTABLE CHEST 1 VIEW COMPARISON:  November 29, 2016 FINDINGS: Endotracheal tube tip is 2.3 cm above the carina. Central catheter tip is at the junction of the left innominate vein and superior vena cava. There are chest tubes  on the left, unchanged in position. No pneumothorax. Areas of consolidation throughout much of the left lung remain. No new opacity evident. No cavitation noted on the left. Right lung appears clear. There is cardiomegaly with pulmonary vascularity within normal limits. No adenopathy. Patient is status post coronary artery bypass grafting. There is postoperative change in the left shoulder. IMPRESSION: Tube and catheter positions without evident pneumothorax. Extensive airspace consolidation on the left is stable. No new opacity on either side. Right lungs clear. Stable cardiac enlargement. Electronically Signed   By: Lowella Grip III M.D.   On: 11/30/2016 08:50   Dg Chest Port 1 View  Result Date: 11/29/2016 CLINICAL DATA:  Empyema of the lung.  Surgery follow-up. EXAM: PORTABLE CHEST 1 VIEW COMPARISON:  11/29/2016 FINDINGS: An endotracheal tube has been placed with tip measuring 1.9 cm above the carina. A left central venous catheter was placed with tip directed somewhat horizontally in the mid right mediastinum, likely representing location of the confluence of the brachiocephalic and superior vena cava veins. 2 left chest tubes. Postoperative changes in the mediastinum. Shallow inspiration. Cardiac enlargement without vascular congestion. Consolidation in the left mid and lower lung zones is improved since previous study. Probable evacuation of pleural fluid collection. No visible pneumothorax. Surgical clips in the right upper quadrant. Loop recorder. Degenerative changes in the spine and shoulders. IMPRESSION: Appliances appear in satisfactory position. Shallow inspiration. Cardiac enlargement. Improved left lung consolidation. Interval evacuation of left pleural fluid collection. Electronically Signed   By: Lucienne Capers  M.D.   On: 11/29/2016 22:42   Dg Chest Port 1 View  Result Date: 11/29/2016 CLINICAL DATA:  Shortness of breath EXAM: PORTABLE CHEST 1 VIEW COMPARISON:  Portable chest x-ray  of 11/28/2016, and CT chest of 11/26/2016 FINDINGS: There is little change in opacity at the left lung base consistent with atelectasis, pneumonia, and apparently loculated left pleural effusion. The right lung is grossly clear by chest x-ray. Mild cardiomegaly is stable. Median sternotomy sutures are noted from prior CABG. No acute bony abnormality is seen. The loop recorder is no longer seen. IMPRESSION: 1. Persistent opacity at the left lung base consistent with atelectasis, pneumonia, in apparently loculated left pleural effusion. 2. Stable cardiomegaly Electronically Signed   By: Ivar Drape M.D.   On: 11/29/2016 10:33   Dg Chest Port 1 View  Result Date: 11/28/2016 CLINICAL DATA:  71 year old female status post left side thoracentesis earlier this hour. EXAM: PORTABLE CHEST 1 VIEW COMPARISON:  0545 hours today and earlier. FINDINGS: Portable AP upright view at 1032 hours. No left pneumothorax. Continued subtotal opacification of the left hemithorax, ventilation not significantly changed. Visible mediastinal contours are stable. Cardiomegaly. Prior CABG. Left chest cardiac event recorder. Right lung appears stable and clear when allowing for portable technique. Negative visible bowel gas pattern. IMPRESSION: No pneumothorax or adverse features status post left side thoracentesis. Electronically Signed   By: Genevie Ann M.D.   On: 11/28/2016 10:53    Scheduled: . acetaminophen  1,000 mg Oral Q6H   Or  . acetaminophen (TYLENOL) oral liquid 160 mg/5 mL  1,000 mg Oral Q6H  . amLODipine  10 mg Oral Daily  . atorvastatin  80 mg Oral q1800  . benzonatate  100 mg Oral TID  . bisacodyl  10 mg Oral Daily  . chlorhexidine gluconate (MEDLINE KIT)  15 mL Mouth Rinse BID  . Chlorhexidine Gluconate Cloth  6 each Topical Daily  . dextromethorphan-guaiFENesin  1 tablet Oral BID  . fentaNYL (SUBLIMAZE) injection  50 mcg Intravenous Once  . hydrALAZINE  50 mg Oral TID  . insulin aspart  0-9 Units Subcutaneous TID  WC  . mouth rinse  15 mL Mouth Rinse QID  . metoCLOPramide (REGLAN) injection  5 mg Intravenous Q6H  . senna-docusate  1 tablet Oral QHS  . sodium chloride flush  10-40 mL Intracatheter Q12H    Assessment/Plan: Pt is a 71 y.o. yo female with a PMHX of CKDIV, CLL s/p chemo, T2DM, HTN, CAD s/p CABG, prior CVA in 2017, afib on eliquis, was admitted to [facility] [place] on 11/25/2016 with pneumonia and pleural effusion. She has had worsening kidney function during her stay.    1. Acute on chronic kidney disease- Creatinine worsening from yesterday 6.54 > 6.9, S/p VATS with chest tubes in place.  -continue to hold torsemide -continue IV fluids -monitor renal function closely, if continues to worsen patient may need more intervention  -monitor UOP closely  -daily weights, strict I/Os  2. Pneumonia w/ complex loculated left pleural effusion- continue antibiotics, s/p thoracentesis and VATS  3. Afib w/ RVR- continue metoprolol, hold eliquis 2/2 hemoptysis  4. T2DM- per primary  5. HTN- stable, continue hydralazine and norvasc  Dispo: pending clinical improvement     LOS: 5 days   Steve Rattler 11/30/2016,9:16 AM

## 2016-11-30 NOTE — Progress Notes (Signed)
Patient ID: Christine Santos, female   DOB: [birth year], 71 y.o.   MRN: [id number] [facility] Evening Rounds  Hemodynamically stable off neo  Pain under control  Urine output good  CT output low.

## 2016-11-30 NOTE — Progress Notes (Signed)
PULMONARY / CRITICAL CARE MEDICINE   Name: Christine Santos MRN: [id number] DOB: April 12, 1945    ADMISSION DATE:  11/25/2016  HISTORY OF PRESENT ILLNESS:   40yoF with hx CLL, CHF, CKD Stage V, Afib (on eliquis), CVA, CAD, who was admitted 11/25/16 after 2 days of SOB, Productive cough, and small volume hemoptysis at home. She was treated with Levoflox 11/2-11/3 . She was on Eliquis (last dose 11/3 AM) which was held. Chest CT showed diffuse dense infiltrates within left lung with loculated appearing moderate sized left pleural effusion.  Underwent VATs 11/6 for left empyema  Significant events reviewed 11/5 thora >> 50 cc cloudy fluid 11/6 VATS decortication    REVIEW OF SYSTEMS:   Review of Systems  Constitutional: Negative.   HENT: Negative.   Eyes: Negative.   Respiratory: Negative for cough, sputum production, shortness of breath and wheezing.   Cardiovascular: Positive for chest pain.  Gastrointestinal: Negative.   Genitourinary: Negative.   Musculoskeletal: Negative.   Skin: Negative.   Neurological: Negative.   Endo/Heme/Allergies: Negative.   Psychiatric/Behavioral: Negative.     SUBJ - intubated, sedated on precedex/ fent Critically ill On low dose neo gtt   VITAL SIGNS: BP 114/63   Pulse 78   Temp 98.3 F (36.8 C) (Oral)   Resp 14   Ht 5' 5.5" (1.664 m)   Wt 189 lb 13.1 oz (86.1 kg)   SpO2 99%   BMI 31.11 kg/m   INTAKE / OUTPUT: I/O last 3 completed shifts: In: 4312.1 [I.V.:4212.1; IV Piggyback:100] Out: 1020 [Urine:695; Blood:75; Chest Tube:250]  PHYSICAL EXAMINATION: General: Elderly female, lying supine , sedated Neuro: RASS-2, moving all extremities HEENT: OP clear, MM moist Cardiovascular: Irreg irreg Lungs: decreased left, chest tube with sanguinous drainage  Abdomen: Soft NTND, BS+ Musculoskeletal: no LE edema Skin: no rashes   LABS:  BMET Recent Labs  Lab 11/28/16 2108 11/29/16 0230 11/30/16 0423  NA 132* 132* 132*  K 4.2 4.4 4.8   CL 93* 95* 97*  CO2 21* 21* 19*  BUN 130* 136* 146*  CREATININE 6.60* 6.54* 6.92*  GLUCOSE 200* 164* 244*   Electrolytes Recent Labs  Lab 11/28/16 0427 11/28/16 2108 11/29/16 0230 11/30/16 0423  CALCIUM 8.2* 8.3* 8.0* 7.9*  MG 1.8  --  2.0  --   PHOS 6.2*  --   --   --    CBC Recent Labs  Lab 11/28/16 2108 11/29/16 0230 11/30/16 0423  WBC 16.2* 15.5* 16.6*  HGB 9.6* 9.3* 8.6*  HCT 27.8* 27.1* 25.3*  PLT 261 246 264   Coag's Recent Labs  Lab 11/28/16 1343 11/28/16 2108 11/29/16 0230  APTT 43* 134* 71*  INR  --  1.33  --     Sepsis Markers Recent Labs  Lab 11/26/16 1524 11/26/16 1820 11/27/16 0949 11/28/16 0427 11/29/16 0230  LATICACIDVEN 1.0 1.1  --  0.8  --   PROCALCITON  --   --  16.71 14.23 8.63   ABG Recent Labs  Lab 11/26/16 1552 11/28/16 2338  PHART 7.427 7.393  PCO2ART 39.3 35.7  PO2ART 60.6* 64.0*   Liver Enzymes Recent Labs  Lab 11/25/16 1503 11/28/16 0427 11/28/16 2108  AST 36  --  52*  ALT 24  --  37  ALKPHOS 61  --  71  BILITOT 0.9  --  0.9  ALBUMIN 2.7* 2.2* 2.2*   Cardiac Enzymes Recent Labs  Lab 11/25/16 2045 11/26/16 0229 11/26/16 1409  TROPONINI 0.83* 0.84* 0.67*   Glucose  Recent Labs  Lab 11/28/16 2141 11/29/16 0806 11/29/16 1211 11/29/16 1334 11/29/16 1939 11/30/16 0921  GLUCAP 196* 166* 130* 123* 132* 241*   STUDIES:  Chest CT (11/26/16):  1. There is extensive consolidation throughout the left lung which may be secondary to pneumonia in the appropriate clinical setting. There is a complex appearing loculated moderate left pleural effusion.  2. Patchy ground-glass and consolidative opacities scattered throughout the right lung  3. Multiple prominent subcentimeter superior mediastinal lymph nodes are nonspecific however likely reactive in etiology. 4. Cardiomegaly.  Coronary arterial atherosclerosis. 5. Aortic Atherosclerosis (ICD10-I70.0).  CULTURES: Blood cultures (11/3): no growth Urine strep ag  POS  ANTIBIOTICS: Levofloxacin 11/2>>11/3 Aztreonam 11/4 >> levaquin 11/4 >>  LINES/TUBES: PIV's  ASSESSMENT / PLAN: 30yoF with hx CLL, CHF, CKD Stage V, Afib (on eliquis), CVA, CAD, who was admitted 11/25/16 with  Dense Left-sided Pneumonia with  empyema  and Acute Hypoxic Respiratory failure   PULMONARY Pneumococcal Pneumonia with empyema s/p VATS 11/6 Acute Hypoxic respiratory failure:  - SBTs with goal extubation today -ct aztreonam/ levaquin until cx back   CARDIOVASCULAR  Afib: Diastolic CHF:- TTE on 02/14/46 showed grade 1 diastolic dysfunction -Septic shock - Use neo gtt for MAP 65 & above, hold amlodipin & hydrallazine - holding Elquis (last dose 11/3 AM) -IV heparin     RENAL 1. AKI-on-CKD Stage V: baseline GFR hovers around 15-20,  now with worsening of renal function  -monitor cr & lytes daily - NO acute indication for HD , making urine    HEMATOLOGIC 1. Hx CLL   Updated husband at bedside The patient is critically ill with multiple organ systems failure and requires high complexity decision making for assessment and support, frequent evaluation and titration of therapies, application of advanced monitoring technologies and extensive interpretation of multiple databases. Critical Care Time devoted to patient care services described in this note independent of APP time is 31 minutes.    Kara Mead MD. [facility].  Pulmonary & Critical care Pager [phone] If no response call [phone]       11/30/2016, 10:29 AM

## 2016-11-30 NOTE — Progress Notes (Signed)
Family Medicine Teaching Service Daily Progress Note Intern Pager: [phone]  Patient name: Christine Santos Medical record number: [id number] Date of birth: [birth year] Age: 71 y.o. Gender: female  Primary Care Provider: [name], MD Consultants: Pulmonology  Code Status: Full   Pt Overview and Major Events to Date:  11/2: Patient admitted for acute hypoxic respiratory failure, treatment started for PNA  11/3: Pulmonology consulted when CT scan chest showed substantial consolidation of left lung with a moderate to large left pleura effusion  11/5 Thorocentesis 11/6 VATS with chest tube placement  Assessment and Plan: Christine Santos is a 71 y.o. female presenting with cough and dyspnea. PMH is significant for T2DM with peripheral neuropathy, CLL s/p chemotherapy, HTN, CKDVI, CAD s/p CABG, hx CVA in 2017, hx of afib on eliquis.   Pneumonia: Currently intubated. Pt is alert. Likely extubation today. S/p VATS yesterday afternoon. Serous drainage. Cultures pending. WBC slight increase to 16.6. Afebrile. CXR shows some improvement of left lobe infiltrate. Currently on Heparin for anticoagulation.  - Levaquin 750 mg (11/2- ), azactam (11/5 - )  - xopenox for elevated RR, tessalon perles prn - supplemental O2 to maintain O2 sats >/= 92% - trend CBC;  - currently on Fentanyl drip for sedation - Neo drip for BP management  - sedation and hemodynamic pressure support per pulmonary  - TCTS recommendations appreciated   AKI on CKD stage 4. Cr continues to worsen to 6.9. Baseline Cr ~3.5.AKI. Gentle fluid hydration initiated per Nephrology. UOP mildly improved 500 mL past 24 hr.  - appreciate Nephrology recommendations  - monitor CBC and BMP - avoid nephrotoxic medications -holding torsemide   Anemia Likely ABLA on Chronic anemia. Hb 8.6 today. Hgb 10.0 on admit. Hg baseline ~10.  - Follow Hg - hold iron due to PNA  H/o diastolic CHF w/ h/o CAD s/p CABG 1996.Weight slighty up today to  178, from 175 yesterday. Increased metoprolol dosage to 25 mg BID due to elevated SBPs and HR up to 130s. HR now < 110. SBPs stable around  At home taking torsemide daily, alternating doses of 50 mg and 100 mg; metoprolol tartrate 12.5 mg BID; norvasc 10 mg daily; atorvastatin 80 mg daily. Last echo 2017 showed EF of 55-60% with G1DD. Troponin trend: 0.83>0.84>0.67.  - hold torsemide due to AKI.  - Continue metoprolol, norvas, atorvastatin - I/Os - Daily weights   Afib:  HR < 110. Rate control with metoprolol 25 mg BID. Holding elequis,and instead on Heparin. Will contact TCTS on thoughts about stopping heparin. - currently on Fentanyl drip for sedation - Neo drip for BP management   T2DM CBGs stable. Last a1c 6.0 on 07/06/16, at home on lantus 37U daily with 5U humalog tid with meals. - monitor CBGs qACHS - SSI, lantus 15 U in a.m.  HTN: stable in 120s/60s. Increased metoprolol to 25 BID. At home on norvasc, metoprolol, torsemide - continue metoprolol and norvasc - hold torsemide  HLD at home on fenofibrate and atorvastatin.last lipid panel 09/04/15. LDL 99. Triglycerides 188. - continue home meds  GERDat home on protonix - continue home med  Goutat home on allopurinol  - continue home med  Anxiety at home on paxil 20 mg daily - stopping due to urinary retention   FEN/GI: NPO. 100 mL/hr DD1/2NS Prophylaxis: Heparin  Disposition: Inpatient pending respiratory improvement  Subjective:  Intubated,but awake.Denies pain or discomfort. Mildy somnolent.  Objective: Temp:  [97.6 F (36.4 C)-98.2 F (36.8 C)] 98.2 F (36.8 C) (  11/07 0300) Pulse Rate:  [72-121] 92 (11/07 0630) Resp:  [10-21] 14 (11/07 0630) BP: (103-143)/(51-81) 121/64 (11/07 0630) SpO2:  [93 %-100 %] 100 % (11/07 0630) Arterial Line BP: (105-130)/(38-46) 120/44 (11/07 0630) FiO2 (%):  [50 %] 50 % (11/07 0343) Weight:  [178 lb 5.6 oz (80.9 kg)-189 lb 13.1 oz (86.1 kg)] 189 lb 13.1 oz (86.1 kg) (11/07  0530) Physical Exam: General: elderly female lying in bed, resting in bed, NAD Cardiovascular: Tachycardic irregularly irregular, but at goal. No murmur appreciated.  Respiratory: Ventilated. Decreased breath sounds on Left lower and middle fields. Comfortable WOB.  Abdomen: soft, NTND  Extremities: No LE edema. No lesions  Laboratory: Recent Labs  Lab 11/28/16 2108 11/29/16 0230 11/30/16 0423  WBC 16.2* 15.5* 16.6*  HGB 9.6* 9.3* 8.6*  HCT 27.8* 27.1* 25.3*  PLT 261 246 264   Recent Labs  Lab 11/25/16 1503  11/28/16 1032 11/28/16 2108 11/29/16 0230 11/30/16 0423  NA 136   < >  --  132* 132* 132*  K 3.6   < >  --  4.2 4.4 4.8  CL 98*   < >  --  93* 95* 97*  CO2 25   < >  --  21* 21* 19*  BUN 65*   < >  --  130* 136* 146*  CREATININE 3.68*   < >  --  6.60* 6.54* 6.92*  CALCIUM 8.9   < >  --  8.3* 8.0* 7.9*  PROT 5.8*  --  5.9* 5.7*  --   --   BILITOT 0.9  --   --  0.9  --   --   ALKPHOS 61  --   --  71  --   --   ALT 24  --   --  37  --   --   AST 36  --   --  52*  --   --   GLUCOSE 160*   < >  --  200* 164* 244*   < > = values in this interval not displayed.     Imaging/Diagnostic Tests: Dg Chest 2 View  Result Date: 11/25/2016 CLINICAL DATA:  71 y/o  F; chest pain with cough and hemoptysis. EXAM: CHEST  2 VIEW COMPARISON:  04/07/2014 PET-CT.  01/10/2009 chest radiograph. FINDINGS: Mild cardiomegaly. Aortic atherosclerosis with calcification. Status post CABG. Median sternotomy wires are aligned. Extensive consolidations throughout the left mid and lower lung zones. Small to moderate left effusion. Right upper quadrant cholecystectomy clips. Left humerus rotator cuff repair anchor. Multilevel degenerative changes of the spine. IMPRESSION: Extensive consolidation throughout left lung and small to moderate left effusion probably representing pneumonia. Follow-up to resolution is recommended to exclude underlying malignancy. Electronically Signed   By: Kristine Garbe M.D.   On: 11/25/2016 15:23   Ct Chest Wo Contrast  Result Date: 11/26/2016 CLINICAL DATA:  Patient with respiratory failure. EXAM: CT CHEST WITHOUT CONTRAST TECHNIQUE: Multidetector CT imaging of the chest was performed following the standard protocol without IV contrast. COMPARISON:  Chest radiograph 11/25/2016. FINDINGS: Cardiovascular: Heart is enlarged. No pericardial effusion. Coronary arterial vascular calcifications. Thoracic aortic vascular calcifications. Stable fluid density structure along the anterior right aspect of the mediastinum (image 75; series 3). Mediastinum/Nodes: Multiple prominent subcentimeter mediastinal lymph nodes are visualized. The esophagus is unremarkable. Abarrent right subclavian vein. Lungs/Pleura: Bowing of the posterior trachea secondary to expiratory phase imaging. There is a a moderate complex left pleural effusion which appears partially loculated. There is  extensive consolidation throughout the left lung. Minimal aerated left upper lobe. Patchy ground-glass and consolidative opacities demonstrated throughout the right upper, right middle and right lower lobes. Trace right pleural fluid. No pneumothorax. Upper Abdomen: Status post cholecystectomy.  No acute process. Musculoskeletal: Thoracic spine degenerative changes. No aggressive or acute appearing osseous lesions. IMPRESSION: 1. There is extensive consolidation throughout the left lung which may be secondary to pneumonia in the appropriate clinical setting. There is a complex appearing loculated moderate left pleural effusion. Recommend radiographic followup to ensure resolution. 2. Patchy ground-glass and consolidative opacities scattered throughout the right lung which may be secondary to an infectious or inflammatory process. Recommend attention on follow-up. 3. Multiple prominent subcentimeter superior mediastinal lymph nodes are nonspecific however likely reactive in etiology. Recommend attention  on follow-up. 4. Cardiomegaly.  Coronary arterial atherosclerosis. 5. Aortic Atherosclerosis (ICD10-I70.0). Electronically Signed   By: Lovey Newcomer M.D.   On: 11/26/2016 14:52   Dg Chest Port 1 View  Result Date: 11/29/2016 CLINICAL DATA:  Empyema of the lung.  Surgery follow-up. EXAM: PORTABLE CHEST 1 VIEW COMPARISON:  11/29/2016 FINDINGS: An endotracheal tube has been placed with tip measuring 1.9 cm above the carina. A left central venous catheter was placed with tip directed somewhat horizontally in the mid right mediastinum, likely representing location of the confluence of the brachiocephalic and superior vena cava veins. 2 left chest tubes. Postoperative changes in the mediastinum. Shallow inspiration. Cardiac enlargement without vascular congestion. Consolidation in the left mid and lower lung zones is improved since previous study. Probable evacuation of pleural fluid collection. No visible pneumothorax. Surgical clips in the right upper quadrant. Loop recorder. Degenerative changes in the spine and shoulders. IMPRESSION: Appliances appear in satisfactory position. Shallow inspiration. Cardiac enlargement. Improved left lung consolidation. Interval evacuation of left pleural fluid collection. Electronically Signed   By: Lucienne Capers M.D.   On: 11/29/2016 22:42   Dg Chest Port 1 View  Result Date: 11/29/2016 CLINICAL DATA:  Shortness of breath EXAM: PORTABLE CHEST 1 VIEW COMPARISON:  Portable chest x-ray of 11/28/2016, and CT chest of 11/26/2016 FINDINGS: There is little change in opacity at the left lung base consistent with atelectasis, pneumonia, and apparently loculated left pleural effusion. The right lung is grossly clear by chest x-ray. Mild cardiomegaly is stable. Median sternotomy sutures are noted from prior CABG. No acute bony abnormality is seen. The loop recorder is no longer seen. IMPRESSION: 1. Persistent opacity at the left lung base consistent with atelectasis, pneumonia, in  apparently loculated left pleural effusion. 2. Stable cardiomegaly Electronically Signed   By: Ivar Drape M.D.   On: 11/29/2016 10:33   Dg Chest Port 1 View  Result Date: 11/28/2016 CLINICAL DATA:  71 year old female status post left side thoracentesis earlier this hour. EXAM: PORTABLE CHEST 1 VIEW COMPARISON:  0545 hours today and earlier. FINDINGS: Portable AP upright view at 1032 hours. No left pneumothorax. Continued subtotal opacification of the left hemithorax, ventilation not significantly changed. Visible mediastinal contours are stable. Cardiomegaly. Prior CABG. Left chest cardiac event recorder. Right lung appears stable and clear when allowing for portable technique. Negative visible bowel gas pattern. IMPRESSION: No pneumothorax or adverse features status post left side thoracentesis. Electronically Signed   By: Genevie Ann M.D.   On: 11/28/2016 10:53   Dg Chest Port 1 View  Result Date: 11/28/2016 CLINICAL DATA:  Shortness of breath.  Pneumonia EXAM: PORTABLE CHEST 1 VIEW COMPARISON:  Chest CT 2 days ago. FINDINGS: Extensive opacification of the  left lung which is a combination of loculated pleural fluid and atelectasis/ consolidation. Cardiomegaly. Status post CABG. There is an implantable loop recorder. The right lung remains clear. IMPRESSION: 1. History of pneumonia with unchanged extensive left lung consolidation and complex pleural effusion. 2. Cardiomegaly. Electronically Signed   By: Monte Fantasia M.D.   On: 11/28/2016 08:04    Bonnita Hollow, MD 11/30/2016, 7:29 AM PGY-1, [facility] Intern pager: [phone], text pages welcome

## 2016-11-30 NOTE — Progress Notes (Addendum)
[facility] for heparin  Indication: Atrial fibrillation  Allergies  Allergen Reactions  . Other     Darvocet  . Penicillins   . Amoxicillin Rash    Has patient had a PCN reaction causing immediate rash, facial/tongue/throat swelling, SOB or lightheadedness with hypotension: No Has patient had a PCN reaction causing severe rash involving mucus membranes or skin necrosis: No Has patient had a PCN reaction that required hospitalization: No Has patient had a PCN reaction occurring within the last 10 years: No If all of the above answers are "NO", then may proceed with Cephalosporin use.   . Cephalexin Rash  . Codeine Phosphate Rash    Patient Measurements: Height: 5' 5.5" (166.4 cm) Weight: 189 lb 13.1 oz (86.1 kg) IBW/kg (Calculated) : 58.15 Heparin Dosing Weight: 86.1 kg  Vital Signs: Temp: 98.3 F (36.8 C) (11/07 0816) Temp Source: Oral (11/07 0816) BP: 114/42 (11/07 0803) Pulse Rate: 89 (11/07 0803)  Labs: Recent Labs    11/28/16 1343 11/28/16 2108 11/29/16 0230 11/30/16 0423  HGB  --  9.6* 9.3* 8.6*  HCT  --  27.8* 27.1* 25.3*  PLT  --  261 246 264  APTT 43* 134* 71*  --   LABPROT  --  16.4*  --   --   INR  --  1.33  --   --   HEPARINUNFRC 1.16*  --  1.04*  --   CREATININE  --  6.60* 6.54* 6.92*    Estimated Creatinine Clearance: 8.2 mL/min (A) (by C-G formula based on SCr of 6.92 mg/dL (H)).   Medical History: Past Medical History:  Diagnosis Date  . Anemia 08/29/2011  . Anxiety   . Arthritis    "legs" (01/22/2013)  . CAD (coronary artery disease)    s/p CABG in 1999  . Cataract   . Chronic kidney disease   . CLL (chronic lymphoblastic leukemia)   . Diastolic heart failure ([place])   . GERD (gastroesophageal reflux disease)   . H/O hiatal hernia   . Heart murmur   . Hyperlipidemia   . Hypertension   . IDDM (insulin dependent diabetes mellitus) ([place])   . PAD (peripheral artery disease) ([place])   . Paroxysmal  atrial fibrillation (H[facility])    a. identified on ILR as part of StrokeAF study  . Peripheral vascular disease ([place])   . Stroke (cerebrum) [facility])    Assessment: 71 yo female admitted on 11/2 with respiratory failure 2/2 PNA. Thoracentesis 11/5 and VATS procedure on 11/6.   Patient's afib is rate controlled with metoprolol. Holding PTA Eliquis.  Last apix dose 11/3 @0915 , it should be cleared by now but will check aptt in addition to heparin level in case it is not. Her last aptt was at goal on 850 units/hr so will restart at that rate, no bolus given recent vats.   Per discussion with surgery will not titrate heparin infusion over 1000 units/hr for the next 24 hours at least. If needed will further discuss rate changes in am.   Goal of Therapy:  Aptt 66-102s Heparin level 0.3-0.7 units/ml Monitor platelets by anticoagulation protocol: Yes   Plan:  Restart heparin at 850 units/hr -Do not titrate over 1000 units/hr per surgery request -heparin level and aptt this afternoon then daily  Erin Hearing PharmD., BCPS Clinical Pharmacist Pager [phone] 11/30/2016 9:21 AM

## 2016-11-30 NOTE — Progress Notes (Signed)
Pharmacy Antibiotic Note  Christine Santos is a 71 y.o. female admitted on 11/25/2016 with acute respiratory failure, tx'd as CAP, now to broaden ABX.  Pharmacy has been consulted for Levaquin and Azactam dosing.  Pt with acute on chronic kidney disease (baseline SCr ~3, now 6.9).  Afebrile, wbc stable at 16. Now s/p thoracentesis and vats, intra-op cultures pending. Renal function continues to worsen with low uop. Current antibiotics are to be continued, they have been adjusted and continue to be at appropriate doses.   Plan: Levaquin 500mg  IV Q48H. Azactam 250mg  IV every 8 hours Follow up culture data  Height: 5' 5.5" (166.4 cm) Weight: 189 lb 13.1 oz (86.1 kg) IBW/kg (Calculated) : 58.15  Temp (24hrs), Avg:97.9 F (36.6 C), Min:97.6 F (36.4 C), Max:98.3 F (36.8 C)  Recent Labs  Lab 11/25/16 1542 11/25/16 1652  11/26/16 1524 11/26/16 1820 11/27/16 0949 11/28/16 0427 11/28/16 2108 11/29/16 0230 11/30/16 0423  WBC  --   --    < >  --   --  17.9* 17.6* 16.2* 15.5* 16.6*  CREATININE  --   --    < >  --   --  5.90* 6.20* 6.60* 6.54* 6.92*  LATICACIDVEN 2.06* 1.79  --  1.0 1.1  --  0.8  --   --   --    < > = values in this interval not displayed.    Estimated Creatinine Clearance: 8.2 mL/min (A) (by C-G formula based on SCr of 6.92 mg/dL (H)).    Allergies  Allergen Reactions  . Other     Darvocet  . Penicillins   . Amoxicillin Rash    Has patient had a PCN reaction causing immediate rash, facial/tongue/throat swelling, SOB or lightheadedness with hypotension: No Has patient had a PCN reaction causing severe rash involving mucus membranes or skin necrosis: No Has patient had a PCN reaction that required hospitalization: No Has patient had a PCN reaction occurring within the last 10 years: No If all of the above answers are "NO", then may proceed with Cephalosporin use.   . Cephalexin Rash  . Codeine Phosphate Rash   Levaquin 11/2>> Aztreonam 11/5>>  11/3 bld  >ngtd Urine strep ag + 11/5 body fluid L pleural - ngtd 11/6 left pleural peel/tissue - ngtd  Thank you for allowing pharmacy to be a part of this patient's care.  Erin Hearing PharmD., BCPS Clinical Pharmacist Pager [phone] 11/30/2016 9:43 AM

## 2016-11-30 NOTE — Progress Notes (Addendum)
TCTS DAILY ICU PROGRESS NOTE                   [address]            [place] [address]          [phone]   1 Day Post-Op Procedure(s) (LRB): VIDEO ASSISTED THORACOSCOPY (VATS)/EMPYEMA (Left)  Total Length of Stay:  LOS: 5 days   Subjective:  Patient awake on vent.  Denies pain.    Objective: Vital signs in last 24 hours: Temp:  [97.6 F (36.4 C)-98.3 F (36.8 C)] 98.3 F (36.8 C) (11/07 0816) Pulse Rate:  [72-121] 89 (11/07 0803) Cardiac Rhythm: Atrial fibrillation (11/07 0400) Resp:  [10-24] 24 (11/07 0803) BP: (103-131)/(42-81) 114/42 (11/07 0803) SpO2:  [93 %-100 %] 100 % (11/07 0803) Arterial Line BP: (105-130)/(38-46) 121/43 (11/07 0700) FiO2 (%):  [50 %] 50 % (11/07 0803) Weight:  [178 lb 5.6 oz (80.9 kg)-189 lb 13.1 oz (86.1 kg)] 189 lb 13.1 oz (86.1 kg) (11/07 0530)  Filed Weights   11/29/16 0500 11/29/16 1335 11/30/16 0530  Weight: 178 lb 5.6 oz (80.9 kg) 178 lb 5.6 oz (80.9 kg) 189 lb 13.1 oz (86.1 kg)    Weight change: 0 lb (0 kg)   Intake/Output from previous day: 11/06 0701 - 11/07 0700 In: 3357.1 [I.V.:3307.1; IV Piggyback:50] Out: 845 [Urine:520; Blood:75; Chest Tube:250]  Intake/Output this shift: Total I/O In: 56.9 [I.V.:56.9] Out: 30 [Urine:30]  Current Meds: Scheduled Meds: . acetaminophen  1,000 mg Oral Q6H   Or  . acetaminophen (TYLENOL) oral liquid 160 mg/5 mL  1,000 mg Oral Q6H  . amLODipine  10 mg Oral Daily  . atorvastatin  80 mg Oral q1800  . benzonatate  100 mg Oral TID  . bisacodyl  10 mg Oral Daily  . chlorhexidine gluconate (MEDLINE KIT)  15 mL Mouth Rinse BID  . Chlorhexidine Gluconate Cloth  6 each Topical Daily  . dextromethorphan-guaiFENesin  1 tablet Oral BID  . fentaNYL (SUBLIMAZE) injection  50 mcg Intravenous Once  . hydrALAZINE  50 mg Oral TID  . insulin aspart  0-9 Units Subcutaneous TID WC  . mouth rinse  15 mL Mouth Rinse QID  . metoCLOPramide (REGLAN) injection  5 mg Intravenous Q6H  .  pantoprazole (PROTONIX) IV  40 mg Intravenous Q24H  . senna-docusate  1 tablet Oral QHS  . sodium chloride flush  10-40 mL Intracatheter Q12H   Continuous Infusions: . aztreonam 250 mg (11/30/16 0851)  . bupivacaine ON-Q pain pump    . dexmedetomidine (PRECEDEX) IV infusion Stopped (11/30/16 0800)  . dextrose 5 % and 0.45% NaCl 100 mL/hr at 11/29/16 1928  . fentaNYL infusion INTRAVENOUS 25 mcg/hr (11/30/16 0800)  . heparin 850 Units/hr (11/30/16 0915)  . levofloxacin (LEVAQUIN) IV Stopped (11/28/16 1759)  . midazolam (VERSED) infusion    . phenylephrine (NEO-SYNEPHRINE) Adult infusion 50 mcg/min (11/30/16 0918)  . potassium chloride     PRN Meds:.fentaNYL, levalbuterol, ondansetron (ZOFRAN) IV, potassium chloride, sodium chloride flush  General appearance: alert and awake on vent Heart: regular rate and rhythm Lungs: diminished breath sounds bibasilar Abdomen: soft, non-tender; bowel sounds normal; no masses,  no organomegaly Wound: clean and dry  Lab Results: CBC: Recent Labs    11/29/16 0230 11/30/16 0423  WBC 15.5* 16.6*  HGB 9.3* 8.6*  HCT 27.1* 25.3*  PLT 246 264   BMET:  Recent Labs    11/29/16 0230 11/30/16 0423  NA 132* 132*  K 4.4 4.8  CL 95* 97*  CO2 21* 19*  GLUCOSE 164* 244*  BUN 136* 146*  CREATININE 6.54* 6.92*  CALCIUM 8.0* 7.9*    CMET: Lab Results  Component Value Date   WBC 16.6 (H) 11/30/2016   HGB 8.6 (L) 11/30/2016   HCT 25.3 (L) 11/30/2016   PLT 264 11/30/2016   GLUCOSE 244 (H) 11/30/2016   CHOL 169 09/04/2015   TRIG 188 (H) 09/04/2015   HDL 32 (L) 09/04/2015   LDLCALC 99 09/04/2015   ALT 37 11/28/2016   AST 52 (H) 11/28/2016   NA 132 (L) 11/30/2016   K 4.8 11/30/2016   CL 97 (L) 11/30/2016   CREATININE 6.92 (H) 11/30/2016   BUN 146 (H) 11/30/2016   CO2 19 (L) 11/30/2016   TSH 3.397 09/04/2015   INR 1.33 11/28/2016   HGBA1C 6.0 07/06/2016      PT/INR:  Recent Labs    11/28/16 2108  LABPROT 16.4*  INR 1.33    Radiology: Dg Chest Port 1 View  Result Date: 11/30/2016 CLINICAL DATA:  Hypoxia EXAM: PORTABLE CHEST 1 VIEW COMPARISON:  November 29, 2016 FINDINGS: Endotracheal tube tip is 2.3 cm above the carina. Central catheter tip is at the junction of the left innominate vein and superior vena cava. There are chest tubes on the left, unchanged in position. No pneumothorax. Areas of consolidation throughout much of the left lung remain. No new opacity evident. No cavitation noted on the left. Right lung appears clear. There is cardiomegaly with pulmonary vascularity within normal limits. No adenopathy. Patient is status post coronary artery bypass grafting. There is postoperative change in the left shoulder. IMPRESSION: Tube and catheter positions without evident pneumothorax. Extensive airspace consolidation on the left is stable. No new opacity on either side. Right lungs clear. Stable cardiac enlargement. Electronically Signed   By: Lowella Grip III M.D.   On: 11/30/2016 08:50   Dg Chest Port 1 View  Result Date: 11/29/2016 CLINICAL DATA:  Empyema of the lung.  Surgery follow-up. EXAM: PORTABLE CHEST 1 VIEW COMPARISON:  11/29/2016 FINDINGS: An endotracheal tube has been placed with tip measuring 1.9 cm above the carina. A left central venous catheter was placed with tip directed somewhat horizontally in the mid right mediastinum, likely representing location of the confluence of the brachiocephalic and superior vena cava veins. 2 left chest tubes. Postoperative changes in the mediastinum. Shallow inspiration. Cardiac enlargement without vascular congestion. Consolidation in the left mid and lower lung zones is improved since previous study. Probable evacuation of pleural fluid collection. No visible pneumothorax. Surgical clips in the right upper quadrant. Loop recorder. Degenerative changes in the spine and shoulders. IMPRESSION: Appliances appear in satisfactory position. Shallow inspiration. Cardiac  enlargement. Improved left lung consolidation. Interval evacuation of left pleural fluid collection. Electronically Signed   By: Lucienne Capers M.D.   On: 11/29/2016 22:42     Assessment/Plan: S/P Procedure(s) (LRB): VIDEO ASSISTED THORACOSCOPY (VATS)/EMPYEMA (Left)  1. Chest tube- on suction, no air leak present, 300 cc output since surgery 2. Empyema- on Levaquin, Azactam- ABX per ID, OR cultures remain pending, leukocytosis at 16, afebrile 3. Renal-Acute on CKD, creatinine at 6.9-- Nephrology on board 4. Dispo- care per CCM, leave chest tubes in place for now.. Will start low dose PCA once extubated     Ellwood Handler 11/30/2016 9:54 AM    Patient examined chest x-ray reviewed Agree with above assessment and plan Patient will be ready to mobilize to chair after extubation  Leave both tubes to suction Operative cultures still pending.  Continue double antibiotics-Levaquin and aztreonam  Start IV heparin for atrial fibrillation but at reduced dose-1000 units/h without bolus since patient had bloody left effusion-empyema Tharon Aquas trigt  MD

## 2016-11-30 NOTE — Procedures (Signed)
Extubation Procedure Note  Patient Details:   Name: Christine Santos DOB: [birth year] MRN: [id number]   Airway Documentation:     Evaluation  O2 sats: stable throughout Complications: No apparent complications Patient did tolerate procedure well. Bilateral Breath Sounds: Clear   Yes pt able to vocalize.   Pt extubated per MD order. No complications noted. Pt has strong adequate cough. Pt was able to breathe around deflated cuff. No Stridor noted.   Irineo Axon [facility] 11/30/2016, 2:20 PM

## 2016-12-01 ENCOUNTER — Inpatient Hospital Stay (HOSPITAL_COMMUNITY): Payer: Medicare Other

## 2016-12-01 DIAGNOSIS — R059 Cough, unspecified: Secondary | ICD-10-CM

## 2016-12-01 DIAGNOSIS — R05 Cough: Secondary | ICD-10-CM

## 2016-12-01 LAB — GLUCOSE, CAPILLARY
GLUCOSE-CAPILLARY: 239 mg/dL — AB (ref 65–99)
GLUCOSE-CAPILLARY: 171 mg/dL — AB (ref 65–99)
GLUCOSE-CAPILLARY: 186 mg/dL — AB (ref 65–99)
GLUCOSE-CAPILLARY: 168 mg/dL — AB (ref 65–99)
GLUCOSE-CAPILLARY: 156 mg/dL — AB (ref 65–99)
Glucose-Capillary: 161 mg/dL — ABNORMAL HIGH (ref 65–99)

## 2016-12-01 LAB — COMPREHENSIVE METABOLIC PANEL
ALBUMIN: 1.9 g/dL — AB (ref 3.5–5.0)
ALK PHOS: 81 U/L (ref 38–126)
ALT: 32 U/L (ref 14–54)
ANION GAP: 14 (ref 5–15)
AST: 42 U/L — AB (ref 15–41)
BUN: 154 mg/dL — ABNORMAL HIGH (ref 6–20)
CALCIUM: 7.6 mg/dL — AB (ref 8.9–10.3)
CO2: 19 mmol/L — AB (ref 22–32)
CREATININE: 7.35 mg/dL — AB (ref 0.44–1.00)
Chloride: 98 mmol/L — ABNORMAL LOW (ref 101–111)
GFR calc Af Amer: 6 mL/min — ABNORMAL LOW (ref >60)
GFR calc non Af Amer: 5 mL/min — ABNORMAL LOW (ref >60)
GLUCOSE: 163 mg/dL — AB (ref 65–99)
Potassium: 4.4 mmol/L (ref 3.5–5.1)
SODIUM: 131 mmol/L — AB (ref 135–145)
Total Bilirubin: 0.8 mg/dL (ref 0.3–1.2)
Total Protein: 4.7 g/dL — ABNORMAL LOW (ref 6.5–8.1)

## 2016-12-01 LAB — CBC
HCT: 22.9 % — ABNORMAL LOW (ref 36.0–46.0)
HEMOGLOBIN: 7.9 g/dL — AB (ref 12.0–15.0)
MCH: 31.2 pg (ref 26.0–34.0)
MCHC: 34.5 g/dL (ref 30.0–36.0)
MCV: 90.5 fL (ref 78.0–100.0)
Platelets: 239 10*3/uL (ref 150–400)
RBC: 2.53 MIL/uL — ABNORMAL LOW (ref 3.87–5.11)
RDW: 14.9 % (ref 11.5–15.5)
WBC: 11.6 10*3/uL — ABNORMAL HIGH (ref 4.0–10.5)

## 2016-12-01 LAB — URINALYSIS, ROUTINE W REFLEX MICROSCOPIC
Bilirubin Urine: NEGATIVE
GLUCOSE, UA: NEGATIVE mg/dL
HGB URINE DIPSTICK: NEGATIVE
Ketones, ur: NEGATIVE mg/dL
NITRITE: NEGATIVE
PROTEIN: NEGATIVE mg/dL
SPECIFIC GRAVITY, URINE: 1.012 (ref 1.005–1.030)
pH: 5 (ref 5.0–8.0)

## 2016-12-01 LAB — BODY FLUID CULTURE
Culture: NO GROWTH
SPECIAL REQUESTS: NORMAL

## 2016-12-01 LAB — CULTURE, BLOOD (ROUTINE X 2)
CULTURE: NO GROWTH
Culture: NO GROWTH

## 2016-12-01 LAB — SODIUM, URINE, RANDOM: Sodium, Ur: 12 mmol/L

## 2016-12-01 LAB — OSMOLALITY, URINE: Osmolality, Ur: 347 mOsm/kg (ref 300–900)

## 2016-12-01 LAB — ACID FAST SMEAR (AFB, MYCOBACTERIA)
Acid Fast Smear: NEGATIVE
Acid Fast Smear: NEGATIVE

## 2016-12-01 LAB — HEPARIN LEVEL (UNFRACTIONATED): HEPARIN UNFRACTIONATED: 0.39 [IU]/mL (ref 0.30–0.70)

## 2016-12-01 LAB — APTT: aPTT: 72 seconds — ABNORMAL HIGH (ref 24–36)

## 2016-12-01 MED ORDER — HYDROCODONE-ACETAMINOPHEN 5-325 MG PO TABS
1.0000 | ORAL_TABLET | Freq: Four times a day (QID) | ORAL | Status: DC | PRN
Start: 2016-12-01 — End: 2016-12-02
  Administered 2016-12-01 – 2016-12-02 (×2): 1 via ORAL
  Filled 2016-12-01 (×3): qty 1

## 2016-12-01 MED ORDER — METOPROLOL TARTRATE 25 MG PO TABS
25.0000 mg | ORAL_TABLET | Freq: Two times a day (BID) | ORAL | Status: DC
  Administered 2016-12-01 – 2016-12-07 (×12): 25 mg via ORAL
  Filled 2016-12-01 (×12): qty 1

## 2016-12-01 MED ORDER — ORAL CARE MOUTH RINSE
15.0000 mL | Freq: Two times a day (BID) | OROMUCOSAL | Status: DC
Start: 2016-12-01 — End: 2016-12-05
  Administered 2016-12-02 – 2016-12-04 (×5): 15 mL via OROMUCOSAL

## 2016-12-01 MED ORDER — AMLODIPINE BESYLATE 10 MG PO TABS
10.0000 mg | ORAL_TABLET | Freq: Every day | ORAL | Status: DC
  Administered 2016-12-01 – 2016-12-04 (×4): 10 mg via ORAL
  Filled 2016-12-01 (×4): qty 1

## 2016-12-01 MED ORDER — METOPROLOL TARTRATE 12.5 MG HALF TABLET
12.5000 mg | ORAL_TABLET | Freq: Two times a day (BID) | ORAL | Status: DC
  Administered 2016-12-01: 12.5 mg via ORAL
  Filled 2016-12-01: qty 1

## 2016-12-01 NOTE — Progress Notes (Signed)
Family Medicine Teaching Service Daily Progress Note Intern Pager: [phone]  Patient name: Christine Santos Medical record number: [id number] Date of birth: [birth year] Age: 71 y.o. Gender: female  Primary Care Provider: [name], MD Consultants: Pulmonology  Code Status: Full   Pt Overview and Major Events to Date:  11/2: Patient admitted for acute hypoxic respiratory failure, treatment started for PNA  11/3: Pulmonology consulted when CT scan chest showed substantial consolidation of left lung with a moderate to large left pleural effusion  11/5 Thoracentesis 11/6 VATS with chest tube placement 11/7 extubated, off pressors   Assessment and Plan: Christine Santos is a 71 y.o. female presenting with cough and dyspnea. PMH is significant for T2DM with peripheral neuropathy, CLL s/p chemotherapy, HTN, CKDVI, CAD s/p CABG, hx CVA in 2017, hx of afib on eliquis.   Pneumonia: Extubated yesterday. Pt is alert. S/p VATS 11/6. Blood cultures NGTD. Wound cultures pending. Acid fast smears negative. WBC 16.6 > 11.6. Afebrile. CXR continues to show improvement of left lobe infiltrate from yesterday. Chest tube in place, drained 120cc yesterday. If continues to drain minimally, TCTS noted can likely d/c 1 chest tube tomorrow. Currently on Heparin for anticoagulation. Sating well on 4L O2 by [place]. On PCA pump managed by TCTS, no doses received in 24 hrs. - Levaquin 750 mg (11/2- ), azactam (11/5 - )  - xopenox for elevated RR, tessalon perles prn - supplemental O2 to maintain O2 sats >/= 92% - trend CBC  - TCTS recommendations appreciated   AKI on CKD stage 4. Cr continues to worsen to 6.9 > 7.35. Baseline Cr ~3.5. AKI. Gentle fluid hydration initiated per Nephrology. UOP slowed 336 mL past 24 hr.  - appreciate Nephrology recommendations  - monitor CBC and BMP - avoid nephrotoxic medications - holding torsemide   Anemia Likely ABLA on Chronic anemia. Hb 7.9 today. Hgb 10.0 on admit. Hg baseline  ~10.  - Follow Hg - hold iron due to PNA  H/o diastolic CHF w/ h/o CAD s/p CABG 1996.Weight slighty up today to 189, from 178 yesterday. Had some low normal BP yesterday and Metoprolol was decreased to 12.5 BID. Hypertensive this morning, may can restart home meds tomorrow. HR stable 75-110 last 24hrs. At home taking torsemide daily, alternating doses of 50 mg and 100 mg; metoprolol tartrate 12.5 mg BID; norvasc 10 mg daily; atorvastatin 80 mg daily. Last echo 2017 showed EF of 55-60% with G1DD. Troponin trend: 0.83>0.84>0.67.  - hold torsemide due to AKI.  - Continue metoprolol, norvasc, atorvastatin - I/Os - Daily weights   Afib:  HR < 115. Rate control with metoprolol 12.5 mg BID. Holding eliquis, and instead on Heparin. Will contact TCTS on thoughts about stopping heparin.  T2DM CBGs stable. Last a1c 6.0 on 07/06/16, at home on lantus 37U daily with 5U humalog tid with meals. - monitor CBGs qACHS - SSI, lantus 15 U  HTN: Had some low normal BP yesterday and Metoprolol was decreased to 12.5 BID. Hypertensive this morning, may can restart home meds tomorrow. At home on norvasc, metoprolol, torsemide. - continue metoprolol and norvasc - hold torsemide  HLD at home on fenofibrate and atorvastatin.last lipid panel 09/04/15. LDL 99. Triglycerides 188. - continue home meds  GERDat home on protonix - continue home med  Goutat home on allopurinol  - continue home med  Anxiety at home on paxil 20 mg daily - stopping due to urinary retention   FEN/GI: NPO. 100 mL/hr DD1/2NS Prophylaxis: Heparin  Disposition: Inpatient pending respiratory improvement  Subjective:  Patient feels ok today, denies any pain. No trouble breathing, on 4L O2 [place].   Objective: Temp:  [97.7 F (36.5 C)-98.3 F (36.8 C)] 97.7 F (36.5 C) (11/08 0700) Pulse Rate:  [75-110] 105 (11/08 0800) Resp:  [9-23] 16 (11/08 0800) BP: (102-165)/(46-143) 165/143 (11/08 0800) SpO2:  [92 %-100 %] 97 % (11/08  0800) Arterial Line BP: (100-127)/(38-52) 127/50 (11/07 1700) FiO2 (%):  [40 %] 40 % (11/07 1025)  Physical Exam: General: elderly female sitting in chair, NAD Cardiovascular: regular rate, regular rhythm. No murmur appreciated.  Respiratory: CTA bilaterally, Slight decreased breath sounds on Left lower field, No increased WOB on 4L O2 [place].  Abdomen: soft, NTND   Laboratory: Recent Labs  Lab 11/29/16 0230 11/30/16 0423 12/01/16 0402  WBC 15.5* 16.6* 11.6*  HGB 9.3* 8.6* 7.9*  HCT 27.1* 25.3* 22.9*  PLT 246 264 239   Recent Labs  Lab 11/25/16 1503  11/28/16 1032 11/28/16 2108 11/29/16 0230 11/30/16 0423 12/01/16 0402  NA 136   < >  --  132* 132* 132* 131*  K 3.6   < >  --  4.2 4.4 4.8 4.4  CL 98*   < >  --  93* 95* 97* 98*  CO2 25   < >  --  21* 21* 19* 19*  BUN 65*   < >  --  130* 136* 146* 154*  CREATININE 3.68*   < >  --  6.60* 6.54* 6.92* 7.35*  CALCIUM 8.9   < >  --  8.3* 8.0* 7.9* 7.6*  PROT 5.8*  --  5.9* 5.7*  --   --  4.7*  BILITOT 0.9  --   --  0.9  --   --  0.8  ALKPHOS 61  --   --  71  --   --  81  ALT 24  --   --  37  --   --  32  AST 36  --   --  52*  --   --  42*  GLUCOSE 160*   < >  --  200* 164* 244* 163*   < > = values in this interval not displayed.     Imaging/Diagnostic Tests: Dg Chest 2 View  Result Date: 11/25/2016 CLINICAL DATA:  71 y/o  F; chest pain with cough and hemoptysis. EXAM: CHEST  2 VIEW COMPARISON:  04/07/2014 PET-CT.  01/10/2009 chest radiograph. FINDINGS: Mild cardiomegaly. Aortic atherosclerosis with calcification. Status post CABG. Median sternotomy wires are aligned. Extensive consolidations throughout the left mid and lower lung zones. Small to moderate left effusion. Right upper quadrant cholecystectomy clips. Left humerus rotator cuff repair anchor. Multilevel degenerative changes of the spine. IMPRESSION: Extensive consolidation throughout left lung and small to moderate left effusion probably representing pneumonia. Follow-up  to resolution is recommended to exclude underlying malignancy. Electronically Signed   By: Kristine Garbe M.D.   On: 11/25/2016 15:23   Ct Chest Wo Contrast  Result Date: 11/26/2016 CLINICAL DATA:  Patient with respiratory failure. EXAM: CT CHEST WITHOUT CONTRAST TECHNIQUE: Multidetector CT imaging of the chest was performed following the standard protocol without IV contrast. COMPARISON:  Chest radiograph 11/25/2016. FINDINGS: Cardiovascular: Heart is enlarged. No pericardial effusion. Coronary arterial vascular calcifications. Thoracic aortic vascular calcifications. Stable fluid density structure along the anterior right aspect of the mediastinum (image 75; series 3). Mediastinum/Nodes: Multiple prominent subcentimeter mediastinal lymph nodes are visualized. The esophagus is unremarkable. Abarrent right subclavian  vein. Lungs/Pleura: Bowing of the posterior trachea secondary to expiratory phase imaging. There is a a moderate complex left pleural effusion which appears partially loculated. There is extensive consolidation throughout the left lung. Minimal aerated left upper lobe. Patchy ground-glass and consolidative opacities demonstrated throughout the right upper, right middle and right lower lobes. Trace right pleural fluid. No pneumothorax. Upper Abdomen: Status post cholecystectomy.  No acute process. Musculoskeletal: Thoracic spine degenerative changes. No aggressive or acute appearing osseous lesions. IMPRESSION: 1. There is extensive consolidation throughout the left lung which may be secondary to pneumonia in the appropriate clinical setting. There is a complex appearing loculated moderate left pleural effusion. Recommend radiographic followup to ensure resolution. 2. Patchy ground-glass and consolidative opacities scattered throughout the right lung which may be secondary to an infectious or inflammatory process. Recommend attention on follow-up. 3. Multiple prominent subcentimeter  superior mediastinal lymph nodes are nonspecific however likely reactive in etiology. Recommend attention on follow-up. 4. Cardiomegaly.  Coronary arterial atherosclerosis. 5. Aortic Atherosclerosis (ICD10-I70.0). Electronically Signed   By: Lovey Newcomer M.D.   On: 11/26/2016 14:52   Dg Chest Port 1 View  Result Date: 12/01/2016 CLINICAL DATA:  Status post VATS for left empyema. EXAM: PORTABLE CHEST 1 VIEW COMPARISON:  11/30/2016 FINDINGS: Two left thoracostomy tubes and left IJ central venous catheter with tip overlying the mid-upper SVC again noted. An endotracheal tube is been removed. Left pleural and parenchymal opacities are unchanged. There is no evidence of pneumothorax. Right basilar atelectasis is again noted. Cardiomegaly and CABG changes again identified. IMPRESSION: Endotracheal tube removal without other significant change. Support apparatus as described. No evidence of pneumothorax. Electronically Signed   By: Margarette Canada M.D.   On: 12/01/2016 08:47   Dg Chest Port 1 View  Result Date: 11/30/2016 CLINICAL DATA:  Hypoxia EXAM: PORTABLE CHEST 1 VIEW COMPARISON:  November 29, 2016 FINDINGS: Endotracheal tube tip is 2.3 cm above the carina. Central catheter tip is at the junction of the left innominate vein and superior vena cava. There are chest tubes on the left, unchanged in position. No pneumothorax. Areas of consolidation throughout much of the left lung remain. No new opacity evident. No cavitation noted on the left. Right lung appears clear. There is cardiomegaly with pulmonary vascularity within normal limits. No adenopathy. Patient is status post coronary artery bypass grafting. There is postoperative change in the left shoulder. IMPRESSION: Tube and catheter positions without evident pneumothorax. Extensive airspace consolidation on the left is stable. No new opacity on either side. Right lungs clear. Stable cardiac enlargement. Electronically Signed   By: Lowella Grip III M.D.   On:  11/30/2016 08:50   Dg Chest Port 1 View  Result Date: 11/29/2016 CLINICAL DATA:  Empyema of the lung.  Surgery follow-up. EXAM: PORTABLE CHEST 1 VIEW COMPARISON:  11/29/2016 FINDINGS: An endotracheal tube has been placed with tip measuring 1.9 cm above the carina. A left central venous catheter was placed with tip directed somewhat horizontally in the mid right mediastinum, likely representing location of the confluence of the brachiocephalic and superior vena cava veins. 2 left chest tubes. Postoperative changes in the mediastinum. Shallow inspiration. Cardiac enlargement without vascular congestion. Consolidation in the left mid and lower lung zones is improved since previous study. Probable evacuation of pleural fluid collection. No visible pneumothorax. Surgical clips in the right upper quadrant. Loop recorder. Degenerative changes in the spine and shoulders. IMPRESSION: Appliances appear in satisfactory position. Shallow inspiration. Cardiac enlargement. Improved left lung consolidation. Interval evacuation of  left pleural fluid collection. Electronically Signed   By: Lucienne Capers M.D.   On: 11/29/2016 22:42   Dg Chest Port 1 View  Result Date: 11/29/2016 CLINICAL DATA:  Shortness of breath EXAM: PORTABLE CHEST 1 VIEW COMPARISON:  Portable chest x-ray of 11/28/2016, and CT chest of 11/26/2016 FINDINGS: There is little change in opacity at the left lung base consistent with atelectasis, pneumonia, and apparently loculated left pleural effusion. The right lung is grossly clear by chest x-ray. Mild cardiomegaly is stable. Median sternotomy sutures are noted from prior CABG. No acute bony abnormality is seen. The loop recorder is no longer seen. IMPRESSION: 1. Persistent opacity at the left lung base consistent with atelectasis, pneumonia, in apparently loculated left pleural effusion. 2. Stable cardiomegaly Electronically Signed   By: Ivar Drape M.D.   On: 11/29/2016 10:33   Dg Chest Port 1  View  Result Date: 11/28/2016 CLINICAL DATA:  71 year old female status post left side thoracentesis earlier this hour. EXAM: PORTABLE CHEST 1 VIEW COMPARISON:  0545 hours today and earlier. FINDINGS: Portable AP upright view at 1032 hours. No left pneumothorax. Continued subtotal opacification of the left hemithorax, ventilation not significantly changed. Visible mediastinal contours are stable. Cardiomegaly. Prior CABG. Left chest cardiac event recorder. Right lung appears stable and clear when allowing for portable technique. Negative visible bowel gas pattern. IMPRESSION: No pneumothorax or adverse features status post left side thoracentesis. Electronically Signed   By: Genevie Ann M.D.   On: 11/28/2016 10:53   Dg Chest Port 1 View  Result Date: 11/28/2016 CLINICAL DATA:  Shortness of breath.  Pneumonia EXAM: PORTABLE CHEST 1 VIEW COMPARISON:  Chest CT 2 days ago. FINDINGS: Extensive opacification of the left lung which is a combination of loculated pleural fluid and atelectasis/ consolidation. Cardiomegaly. Status post CABG. There is an implantable loop recorder. The right lung remains clear. IMPRESSION: 1. History of pneumonia with unchanged extensive left lung consolidation and complex pleural effusion. 2. Cardiomegaly. Electronically Signed   By: Monte Fantasia M.D.   On: 11/28/2016 08:04    Rory Percy, DO 12/01/2016, 9:30 AM PGY-1, [place] Intern pager: [phone], text pages welcome

## 2016-12-01 NOTE — Progress Notes (Signed)
Subjective: Doing well today, was extubated. Denies pain. No current complaints.  Objective: Vital signs in last 24 hours: Temp:  [97.7 F (36.5 C)-98.3 F (36.8 C)] 97.7 F (36.5 C) (11/08 0700) Pulse Rate:  [75-110] 105 (11/08 0800) Resp:  [9-23] 16 (11/08 0800) BP: (102-165)/(46-143) 165/143 (11/08 0800) SpO2:  [92 %-100 %] 97 % (11/08 0800) Arterial Line BP: (100-127)/(38-52) 127/50 (11/07 1700) FiO2 (%):  [40 %] 40 % (11/07 1025) Weight change:   Intake/Output from previous day: 11/07 0701 - 11/08 0700 In: 1587.1 [I.V.:1237.1; IV Piggyback:350] Out: 456 [Urine:336; Chest Tube:120] Intake/Output this shift: Total I/O In: 17 [I.V.:17] Out: -   General appearance: alert, cooperative and no distress Resp: diminished breath sounds anterior - bilateral and normal work of breathing Cardio: irregularly irregular rhythm GI: soft, non-tender; bowel sounds normal; no masses,  no organomegaly Extremities: edema trace and   Skin: Skin color, texture, turgor normal. No rashes or lesions Neurologic: Grossly normal.   Lab Results: Recent Labs    11/30/16 0423 12/01/16 0402  WBC 16.6* 11.6*  HGB 8.6* 7.9*  HCT 25.3* 22.9*  PLT 264 239   BMET:  Recent Labs    11/30/16 0423 12/01/16 0402  NA 132* 131*  K 4.8 4.4  CL 97* 98*  CO2 19* 19*  GLUCOSE 244* 163*  BUN 146* 154*  CREATININE 6.92* 7.35*  CALCIUM 7.9* 7.6*   No results for input(s): PTH in the last 72 hours. Iron Studies: No results for input(s): IRON, TIBC, TRANSFERRIN, FERRITIN in the last 72 hours.  Studies/Results: Dg Chest Port 1 View  Result Date: 11/30/2016 CLINICAL DATA:  Hypoxia EXAM: PORTABLE CHEST 1 VIEW COMPARISON:  November 29, 2016 FINDINGS: Endotracheal tube tip is 2.3 cm above the carina. Central catheter tip is at the junction of the left innominate vein and superior vena cava. There are chest tubes on the left, unchanged in position. No pneumothorax. Areas of consolidation throughout much of  the left lung remain. No new opacity evident. No cavitation noted on the left. Right lung appears clear. There is cardiomegaly with pulmonary vascularity within normal limits. No adenopathy. Patient is status post coronary artery bypass grafting. There is postoperative change in the left shoulder. IMPRESSION: Tube and catheter positions without evident pneumothorax. Extensive airspace consolidation on the left is stable. No new opacity on either side. Right lungs clear. Stable cardiac enlargement. Electronically Signed   By: Lowella Grip III M.D.   On: 11/30/2016 08:50   Dg Chest Port 1 View  Result Date: 11/29/2016 CLINICAL DATA:  Empyema of the lung.  Surgery follow-up. EXAM: PORTABLE CHEST 1 VIEW COMPARISON:  11/29/2016 FINDINGS: An endotracheal tube has been placed with tip measuring 1.9 cm above the carina. A left central venous catheter was placed with tip directed somewhat horizontally in the mid right mediastinum, likely representing location of the confluence of the brachiocephalic and superior vena cava veins. 2 left chest tubes. Postoperative changes in the mediastinum. Shallow inspiration. Cardiac enlargement without vascular congestion. Consolidation in the left mid and lower lung zones is improved since previous study. Probable evacuation of pleural fluid collection. No visible pneumothorax. Surgical clips in the right upper quadrant. Loop recorder. Degenerative changes in the spine and shoulders. IMPRESSION: Appliances appear in satisfactory position. Shallow inspiration. Cardiac enlargement. Improved left lung consolidation. Interval evacuation of left pleural fluid collection. Electronically Signed   By: Lucienne Capers M.D.   On: 11/29/2016 22:42    Scheduled: . acetaminophen  1,000 mg Oral Q6H  Or  . acetaminophen (TYLENOL) oral liquid 160 mg/5 mL  1,000 mg Oral Q6H  . atorvastatin  80 mg Oral q1800  . benzonatate  100 mg Oral TID  . bisacodyl  10 mg Oral Daily  .  Chlorhexidine Gluconate Cloth  6 each Topical Daily  . dextromethorphan-guaiFENesin  1 tablet Oral BID  . fentaNYL   Intravenous Q4H  . insulin aspart  0-9 Units Subcutaneous TID WC  . mouth rinse  15 mL Mouth Rinse BID  . pantoprazole (PROTONIX) IV  40 mg Intravenous Q24H  . senna-docusate  1 tablet Oral QHS  . sodium chloride flush  10-40 mL Intracatheter Q12H    Assessment/Plan: Pt is a 71 y.o. yo female with a PMHX of CKDIV, CLL s/p chemo, T2DM, HTN, CAD s/p CABG, prior CVA in 2017, afib on eliquis, was admitted to [facility] on 11/25/2016 with pneumonia and pleural effusion. She has had worsening kidney function during her stay.    1. Acute on chronic kidney disease- Creatinine continues to climb which is common after VATS. No needs for HD at this time. Patient making good urine.  -continue to hold torsemide -continue IV fluids -monitor renal function closely -monitor UOP closely  -daily weights, strict I/Os  2. Pneumonia w/ complex loculated left pleural effusion- continue antibiotics, s/p thoracentesis and VATS  3. Afib w/ RVR- continue metoprolol, hold eliquis 2/2 hemoptysis  4. T2DM- per primary  5. HTN- stable, continue hydralazine and norvasc  6. ?UTI- Foley in, large leuks in UA- send urine for culture. Already on Levo and aztreonam -d/w primary team  Dispo: pending clinical improvement     LOS: 6 days   Steve Rattler 12/01/2016,8:39 AM

## 2016-12-01 NOTE — Progress Notes (Signed)
[place] for heparin  Indication: Atrial fibrillation  Allergies  Allergen Reactions  . Other     Darvocet  . Penicillins   . Amoxicillin Rash    Has patient had a PCN reaction causing immediate rash, facial/tongue/throat swelling, SOB or lightheadedness with hypotension: No Has patient had a PCN reaction causing severe rash involving mucus membranes or skin necrosis: No Has patient had a PCN reaction that required hospitalization: No Has patient had a PCN reaction occurring within the last 10 years: No If all of the above answers are "NO", then may proceed with Cephalosporin use.   . Cephalexin Rash  . Codeine Phosphate Rash    Patient Measurements: Height: 5' 5.5" (166.4 cm) Weight: 189 lb 13.1 oz (86.1 kg) IBW/kg (Calculated) : 58.15 Heparin Dosing Weight: 86.1 kg  Vital Signs: Temp: 97.7 F (36.5 C) (11/08 0400) Temp Source: Oral (11/08 0400) BP: 161/75 (11/08 0600) Pulse Rate: 98 (11/08 0600)  Labs: Recent Labs    11/28/16 2108 11/29/16 0230 11/30/16 0423 11/30/16 1655 12/01/16 0402 12/01/16 0403  HGB 9.6* 9.3* 8.6*  --  7.9*  --   HCT 27.8* 27.1* 25.3*  --  22.9*  --   PLT 261 246 264  --  239  --   APTT 134* 71*  --  63* 72*  --   LABPROT 16.4*  --   --   --   --   --   INR 1.33  --   --   --   --   --   HEPARINUNFRC  --  1.04*  --  0.59  --  0.39  CREATININE 6.60* 6.54* 6.92*  --  7.35*  --     Estimated Creatinine Clearance: 7.7 mL/min (A) (by C-G formula based on SCr of 7.35 mg/dL (H)).   Medical History: Past Medical History:  Diagnosis Date  . Anemia 08/29/2011  . Anxiety   . Arthritis    "legs" (01/22/2013)  . CAD (coronary artery disease)    s/p CABG in 1999  . Cataract   . Chronic kidney disease   . CLL (chronic lymphoblastic leukemia)   . Diastolic heart failure ([place])   . GERD (gastroesophageal reflux disease)   . H/O hiatal hernia   . Heart murmur   . Hyperlipidemia   . Hypertension   .  IDDM (insulin dependent diabetes mellitus) ([place])   . PAD (peripheral artery disease) ([place])   . Paroxysmal atrial fibrillation (HCC)    a. identified on ILR as part of StrokeAF study  . Peripheral vascular disease ([place])   . Stroke (cerebrum) [facility])    Assessment: 71 yo female admitted on 11/2 with respiratory failure 2/2 PNA. Thoracentesis 11/5 and VATS procedure on 11/6.   Patient's afib is rate controlled with metoprolol>>currently on hold. Holding PTA Eliquis.  Last apix dose 11/3 @0915 . Heparin level (0.59) and aPTT (63) were within goal range yesterday afternoon. Heparin level this morning came back at 0.39 and aPTT came back at 72, both within goal range, on 850 units/hr. Hgb down slightly today from 8.6 to 7.9. Platelets remain within normal limits. No signs/symptoms of bleeding. No issues with infusion per nursing.  Per discussion with surgery yesterday, will not titrate heparin infusion over 1000 units/hr for 24 hours at least. Given correlation with heparin level and aPTT will continue to monitor infusion with heparin levels only.   Goal of Therapy:  Heparin level 0.3-0.7 units/ml Monitor platelets by anticoagulation protocol:  Yes   Plan:  Continue heparin at 850 units/hr -Do not titrate over 1000 units/hr per surgery request >> will clarify if can increase in future if needed -Monitor daily heparin level and CBC  Doylene Canard, PharmD Clinical Pharmacist  Pager: [phone] Clinical Phone for 12/01/2016 until 3:30pm: [phone] If after 3:30pm, please call main pharmacy at [phone]

## 2016-12-01 NOTE — Op Note (Signed)
NAMEEMMYLOU, Christine Santos NO.:  [id number]  MEDICAL RECORD NO.:  [id number]  LOCATION:  [id number]                        FACILITY:  [facility]  PHYSICIAN:  [name], M.D.  DATE OF BIRTH:  [birth year]  DATE OF PROCEDURE:  11/29/2016 DATE OF DISCHARGE:                              OPERATIVE REPORT   OPERATIONS: 1. Left video-assisted thoracoscopic surgery (video-assisted     thoracoscopic surgery) with mini thoracotomy for drainage of left     empyema and decortication of left lower lobe. 2. Placement of On-Q analgesia irrigation system.  SURGEON:  [name], MD.  ASSISTANT:  John Giovanni, PA-C.  ANESTHESIA:  General.  PREOPERATIVE DIAGNOSES:  Left lower lobe pneumonia with left empyema and sepsis, chronic renal failure, history of CLL.  POSTOPERATIVE DIAGNOSIS:  Left lower lobe pneumonia with left empyema and sepsis, chronic renal failure, history of CLL.  DESCRIPTION OF PROCEDURE:  After the patient had been thoroughly evaluated and the procedure of left VATS for drainage of empyema, discussed with the patient and family.  The patient was brought to the operating room following documentation of informed consent and proper site marking.  She was placed supine on the operating room table and general anesthesia was induced.  A double-lumen endotracheal tube was placed by the Anesthesia team.  The patient was turned to expose left side up.  The left chest was prepped and draped as a sterile field.  A proper time-out was performed.  A small incision was made at the tip of the scapula in the fifth interspace anteriorly directed.  The pleural space was obliterated with adhesions.  The incision was extended approximately 5 cm and then the pleural space was entered.  The pleural space was filled with soft and some dense adhesions.  Pockets of fluid which were cloudy, fibrinous exudate over the surface of the left lower lobe as well as a more organized  leather-like peel on the visceral pleura of the left lower lobe.  Using careful dissection, the pockets of fluid were all opened and drained.  The fibrinous exudate on the chest wall was carefully removed as well as from the left hemidiaphragm.  A decortication of the left lower lobe was then performed removing the organized peel on the surface of the lung.  After the drainage and decortication, the pleural space was irrigated with copious amounts of warm saline and then suctioned.  Chest tubes were placed to drain the superior and inferior portion of the left pleural space and brought out through separate incisions.  The lung was then re-expanded under direct vision with good re-expansion.  The ribs were reapproximated with a #2 Vicryl pericostal.  The muscle layers were reapproximated in layers using Vicryl and the skin was closed with a subcuticular.  An On-Q catheter was placed in the chest wall musculature between the chest tubes and the main incision and then connected to a reservoir of 0.5% Marcaine.  The patient was then turned back supine, the double-lumen tube was exchanged for a single-lumen tube, and the patient was then transferred back to the ICU in stable condition.     [name], M.D.  PV/MEDQ  D:  11/30/2016  T:  12/01/2016  Job:  [id number]  cc:   Rigoberto Noel, MD

## 2016-12-01 NOTE — Progress Notes (Signed)
PULMONARY / CRITICAL CARE MEDICINE   Name: Christine Santos MRN: [id number] DOB: 08-17-1945    ADMISSION DATE:  11/25/2016  HISTORY OF PRESENT ILLNESS:   25yoF with hx CLL, CHF, CKD Stage V, Afib (on eliquis), CVA, CAD, who was admitted 11/25/16 after 2 days of SOB, Productive cough, and small volume hemoptysis at home. She was treated with Levoflox 11/2-11/3 . She was on Eliquis (last dose 11/3 AM) which was held. Chest CT showed diffuse dense infiltrates within left lung with loculated appearing moderate sized left pleural effusion.  Underwent VATs 11/6 for left empyema  Significant events reviewed 11/5 thora >> 50 cc cloudy fluid 11/6 VATS decortication     Subjective Awake and alert 1 day postoperative both left VATS.  She is having minimal pain.  Currently on a heparin drip for chronic atrial fibrillation.  Normally she is on Eliquis.  Note that her blood pressures leaning towards hypertension.  We may need to restart her home antihypertensives.  No further need for Neo-Synephrine.   VITAL SIGNS: BP (!) 165/143 (BP Location: Left Arm)   Pulse (!) 107   Temp 97.7 F (36.5 C) (Oral)   Resp 16   Ht 5' 5.5" (1.664 m)   Wt 189 lb 13.1 oz (86.1 kg)   SpO2 97%   BMI 31.11 kg/m   INTAKE / OUTPUT: I/O last 3 completed shifts: In: 3494.1 [I.V.:3094.1; IV Piggyback:400] Out: 237 [Urine:431; Chest Tube:370]  PHYSICAL EXAMINATION: General: Well-nourished well-developed white female in no acute distress sitting in chair HEENT: MM pink/moist.  No JVD or lymphadenopathy appreciated PSY: Dull effect Neuro: Neurologically intact moves all extremities x4 follows commands speech is clear CV: Heart sounds are regular atrial fib by monitor with a ventricular rate of 100-106 PULM: Decreased air movement greater on the left.  Left chest tube with approximately 50 cc of drainage.  Chest tube remains on 20 cm waterseal.  No air leak is appreciated from chest tube.  Left chest tube dressing and  VAC dressing are intact SE:GBTD, non-tender, bsx4 active  Extremities: warm/dry, 1+ lower extremity edema  Skin: no rashes or lesions    LABS:  BMET Recent Labs  Lab 11/29/16 0230 11/30/16 0423 12/01/16 0402  NA 132* 132* 131*  K 4.4 4.8 4.4  CL 95* 97* 98*  CO2 21* 19* 19*  BUN 136* 146* 154*  CREATININE 6.54* 6.92* 7.35*  GLUCOSE 164* 244* 163*   Electrolytes Recent Labs  Lab 11/28/16 0427  11/29/16 0230 11/30/16 0423 12/01/16 0402  CALCIUM 8.2*   < > 8.0* 7.9* 7.6*  MG 1.8  --  2.0  --   --   PHOS 6.2*  --   --   --   --    < > = values in this interval not displayed.   CBC Recent Labs  Lab 11/29/16 0230 11/30/16 0423 12/01/16 0402  WBC 15.5* 16.6* 11.6*  HGB 9.3* 8.6* 7.9*  HCT 27.1* 25.3* 22.9*  PLT 246 264 239   Coag's Recent Labs  Lab 11/28/16 2108 11/29/16 0230 11/30/16 1655 12/01/16 0402  APTT 134* 71* 63* 72*  INR 1.33  --   --   --     Sepsis Markers Recent Labs  Lab 11/26/16 1524 11/26/16 1820 11/27/16 0949 11/28/16 0427 11/29/16 0230  LATICACIDVEN 1.0 1.1  --  0.8  --   PROCALCITON  --   --  16.71 14.23 8.63   ABG Recent Labs  Lab 11/26/16 1552 11/28/16 2338  PHART 7.427 7.393  PCO2ART 39.3 35.7  PO2ART 60.6* 64.0*   Liver Enzymes Recent Labs  Lab 11/25/16 1503 11/28/16 0427 11/28/16 2108 12/01/16 0402  AST 36  --  52* 42*  ALT 24  --  37 32  ALKPHOS 61  --  71 81  BILITOT 0.9  --  0.9 0.8  ALBUMIN 2.7* 2.2* 2.2* 1.9*   Cardiac Enzymes Recent Labs  Lab 11/25/16 2045 11/26/16 0229 11/26/16 1409  TROPONINI 0.83* 0.84* 0.67*   Glucose Recent Labs  Lab 11/29/16 1939 11/30/16 0921 11/30/16 1212 11/30/16 1650 11/30/16 2342 12/01/16 0324  GLUCAP 132* 241* 236* 220* 169* 161*   STUDIES:  Chest CT (11/26/16):  1. There is extensive consolidation throughout the left lung which may be secondary to pneumonia in the appropriate clinical setting. There is a complex appearing loculated moderate left pleural  effusion.  2. Patchy ground-glass and consolidative opacities scattered throughout the right lung  3. Multiple prominent subcentimeter superior mediastinal lymph nodes are nonspecific however likely reactive in etiology. 4. Cardiomegaly.  Coronary arterial atherosclerosis. 5. Aortic Atherosclerosis (ICD10-I70.0).  CULTURES: Blood cultures (11/3): no growth Urine strep ag POS Body fluid culture 11/30/2016>>   ANTIBIOTICS: Levofloxacin 11/2>>11/3 Aztreonam 11/4 >> levaquin 11/4 >>  LINES/TUBES: 11/30/2016 left central venous line>> 11/30/2016 left chest tube>>  ASSESSMENT / PLAN: 29yoF with hx CLL, CHF, CKD Stage V, Afib (on eliquis), CVA, CAD, who was admitted 11/25/16 with  Dense Left-sided Pneumonia with  empyema  and Acute Hypoxic Respiratory failure   PULMONARY Pneumococcal Pneumonia with empyema s/p VATS 11/6 Acute Hypoxic respiratory failure:  -Currently extubated and off 4 L nasal cannula -Pulmonary toilet mobilize as able -Mobilize as able -ct aztreonam/ levaquin until cx back   CARDIOVASCULAR  Afib: Diastolic CHF:- TTE on 03/16/23 showed grade 1 diastolic dysfunction -Septic shock -Currently off Neo-Synephrine drip, will start integrating her oral antihypertensive back into her pharmaceutical regimen starting the Norvasc on 12/01/2016 she has been on Apresoline  50 mg 3 times a day that she takes at home.  Further she is on Demadex at home. - holding Elquis (last dose 11/3 AM) -IV heparin will transition back to Eliquis when okay with CVT S  Endocrine: CBG (last 3)  Recent Labs    11/30/16 1650 11/30/16 2342 12/01/16 0324  GLUCAP 220* 169* 161*    Type 2 diabetes -Sliding scale insulin  RENAL Lab Results  Component Value Date   CREATININE 7.35 (H) 12/01/2016   CREATININE 6.92 (H) 11/30/2016   CREATININE 6.54 (H) 11/29/2016   CREATININE 3.4 (HH) 09/06/2016   CREATININE 3.18 (H) 03/30/2016   CREATININE 3.29 (H) 11/25/2015   CREATININE 2.89 (H) 11/19/2015    CREATININE 5.5 (HH) 09/08/2015   CREATININE 3.0 (HH) 03/10/2015   Recent Labs  Lab 11/29/16 0230 11/30/16 0423 12/01/16 0402  K 4.4 4.8 4.4    1. AKI-on-CKD Stage V: baseline GFR hovers around 15-20,  now with worsening of renal function  -monitor cr & lytes daily - NO acute indication for HD , making urine -May need renal consult in the future -Continue to monitor urine output and electrolytes and creatinine -Monitor CvP to evaluate fluid status -May need a renal ultrasound in the future.    HEMATOLOGIC Recent Labs    11/30/16 0423 12/01/16 0402  HGB 8.6* 7.9*   History of CLL Monitor hemoglobin post surgical intervention   Richardson Landry Minor ACNP Maryanna Shape PCCM Pager [phone] till 1 pm If no answer page [phone] 12/01/2016,  8:27 AM  STAFF NOTE: I, Merrie Roof, MD FACP have personally reviewed patient's available data, including medical history, events of note, physical examination and test results as part of my evaluation. I have discussed with resident/NP and other care providers such as pharmacist, RN and RRT. In addition, I personally evaluated patient and elicited key findings of: awake, in chair, no distress, ronchi left base mild, jvd wnl, abdo soft, min to no edema, no leak, output noted from chest tube, pcxr which I reviewed shows infiltrate left base slight better aeration, no ptx, chest tube placed well, maintain IS, follow cultures closely, likely in am can go to monotherapy levofloxacin and dc aztreonam,  Her crt is rising from baseline 3s, will assess volume status, get urine na, osm, get UA, assess cvp with line still in place, repeat pcxr in am , chest tube per cvts, assess renal US, maintain saline, hold lasix for now and assess above parameters, keeping heparin drip, avoid long acting oral agents still for now,  Her renal failure is likely ATN, follow Na in am with 131, dc line neck when able, dc neo off mar, add home metop to home norvasc 10, dc  precedex off mar, mobilize, sconsider transfer to sdu, I have updated pt infull, maintain diet  Lavon Paganini. Titus Mould, MD, [place] Pgr: [facility] 12/01/2016 8:54 AM

## 2016-12-01 NOTE — Progress Notes (Signed)
Physical Therapy Treatment Patient Details Name: Christine Santos MRN: [id number] DOB: 1945/11/02 Today's Date: 12/01/2016    History of Present Illness Pt adm with PNA and lt empyema. Pt to underwent VATS 11/6 PM. PMH - CLL, CHF, CKD Stage V, Afib, CVA, CAD    PT Comments    Pt now s/p VATS. May need ST-SNF. Significant other is visually impaired. If pt progresses rapidly may be able to return home.   Follow Up Recommendations  SNF(if progresses rapidly may be able to return home)     Equipment Recommendations  None recommended by PT    Recommendations for Other Services       Precautions / Restrictions Precautions Precautions: Fall Restrictions Weight Bearing Restrictions: No    Mobility  Bed Mobility Overal bed mobility: Needs Assistance Bed Mobility: Sit to Supine       Sit to supine: +2 for physical assistance;Mod assist   General bed mobility comments: Assist to lower trunk and to bring feet up into bed  Transfers Overall transfer level: Needs assistance Equipment used: Rolling walker (2 wheeled) Transfers: Sit to/from Stand Sit to Stand: Min assist;+2 physical assistance         General transfer comment: Assist to bring hips up and for balance. Verbal cues for hand placement  Ambulation/Gait Ambulation/Gait assistance: Min assist Ambulation Distance (Feet): 60 Feet(x 2) Assistive device: Rolling walker (2 wheeled) Gait Pattern/deviations: Step-through pattern;Drifts right/left;Decreased step length - right;Decreased step length - left Gait velocity: decr Gait velocity interpretation: Below normal speed for age/gender General Gait Details: Assist for balance and support. Pt initially on 4L of O2 but SpO2 to 86%. O2 incr to 6L and pt able to maintain >90%. Pt with sitting rest break of 3-4 minutes between two walks   Stairs            Wheelchair Mobility    Modified Rankin (Stroke Patients Only)       Balance Overall balance assessment:  Needs assistance Sitting-balance support: No upper extremity supported;Feet supported Sitting balance-Leahy Scale: Good     Standing balance support: Single extremity supported Standing balance-Leahy Scale: Poor Standing balance comment: walker and min guard for static standing.                            Cognition Arousal/Alertness: Awake/alert Behavior During Therapy: WFL for tasks assessed/performed Overall Cognitive Status: Within Functional Limits for tasks assessed                                        Exercises      General Comments        Pertinent Vitals/Pain Pain Assessment: Faces Faces Pain Scale: Hurts little more Pain Location: lt chest Pain Descriptors / Indicators: Grimacing Pain Intervention(s): Monitored during session;Limited activity within patient's tolerance;Repositioned    Home Living                      Prior Function            PT Goals (current goals can now be found in the care plan section) Progress towards PT goals: Progressing toward goals    Frequency    Min 3X/week      PT Plan Discharge plan needs to be updated    Co-evaluation  AM-PAC PT "6 Clicks" Daily Activity  Outcome Measure  Difficulty turning over in bed (including adjusting bedclothes, sheets and blankets)?: Unable Difficulty moving from lying on back to sitting on the side of the bed? : Unable Difficulty sitting down on and standing up from a chair with arms (e.g., wheelchair, bedside commode, etc,.)?: Unable Help needed moving to and from a bed to chair (including a wheelchair)?: A Little Help needed walking in hospital room?: A Little Help needed climbing 3-5 steps with a railing? : A Lot 6 Click Score: 11    End of Session Equipment Utilized During Treatment: Gait belt;Oxygen Activity Tolerance: Patient limited by fatigue Patient left: with call bell/phone within reach;with family/visitor present;in  bed Nurse Communication: Mobility status;Other (comment)(Incr O2 needs with amb) PT Visit Diagnosis: Unsteadiness on feet (R26.81);Muscle weakness (generalized) (M62.81)     Time: 9166-0600 PT Time Calculation (min) (ACUTE ONLY): 23 min  Charges:  $Gait Training: 23-37 mins                    G Codes:       [facility] PT [place] 12/01/2016, 3:40 PM

## 2016-12-01 NOTE — Progress Notes (Signed)
DC fentanyl PCA 60mls wasted in sink in med room with Jinny Blossom, Therapist, sports.  Rowe Pavy, RN

## 2016-12-01 NOTE — Progress Notes (Addendum)
      [address]       [place] [address]             [phone]      2 Days Post-Op Procedure(s) (LRB): VIDEO ASSISTED THORACOSCOPY (VATS)/EMPYEMA (Left)   Subjective:  Ms. Kirt is up in chair this morning.  She denies pain, states she isn't using PCA.  Some productive cough.  No BM, but is passing gas.  Objective: Vital signs in last 24 hours: Temp:  [97.7 F (36.5 C)-98.3 F (36.8 C)] 97.7 F (36.5 C) (11/08 0400) Pulse Rate:  [75-110] 98 (11/08 0600) Cardiac Rhythm: Atrial fibrillation (11/08 0400) Resp:  [9-23] 18 (11/08 0600) BP: (102-161)/(46-89) 161/75 (11/08 0600) SpO2:  [92 %-100 %] 95 % (11/08 0600) Arterial Line BP: (100-127)/(38-52) 127/50 (11/07 1700) FiO2 (%):  [40 %] 40 % (11/07 1025)  Intake/Output from previous day: 11/07 0701 - 11/08 0700 In: 1587.1 [I.V.:1237.1; IV Piggyback:350] Out: 456 [Urine:336; Chest Tube:120]  General appearance: alert, cooperative and no distress Heart: irregularly irregular rhythm Lungs: diminished breath sounds bibasilar and course, clear with cough Abdomen: soft, non-tender; bowel sounds normal; no masses,  no organomegaly Extremities: extremities normal, atraumatic, no cyanosis or edema Wound: clean and dry  Lab Results: Recent Labs    11/30/16 0423 12/01/16 0402  WBC 16.6* 11.6*  HGB 8.6* 7.9*  HCT 25.3* 22.9*  PLT 264 239   BMET:  Recent Labs    11/30/16 0423 12/01/16 0402  NA 132* 131*  K 4.8 4.4  CL 97* 98*  CO2 19* 19*  GLUCOSE 244* 163*  BUN 146* 154*  CREATININE 6.92* 7.35*  CALCIUM 7.9* 7.6*    PT/INR:  Recent Labs    11/28/16 2108  LABPROT 16.4*  INR 1.33   ABG    Component Value Date/Time   PHART 7.393 11/28/2016 2338   HCO3 21.7 11/28/2016 2338   TCO2 23 11/28/2016 2338   ACIDBASEDEF 3.0 (H) 11/28/2016 2338   O2SAT 92.0 11/28/2016 2338   CBG (last 3)  Recent Labs    11/30/16 1650 11/30/16 2342 12/01/16 0324  GLUCAP 220* 169* 161*     Assessment/Plan: S/P Procedure(s) (LRB): VIDEO ASSISTED THORACOSCOPY (VATS)/EMPYEMA (Left)  1. Chest tube- no air leak, 50 cc output recorded yesterday- leave chest tubes in place for now 2. Pulm- extubated yesterday, some improvement in consolidation on left, continue to wean oxygen, nebs, IS 3. Empyema- on Levaquin and Azactam, remains afebrile, cultures remain negative to date 4. Renal- Acute on Chronic CKD, creatinine up to 7.35, making some urine, Nephrology is following 5. CV- A. Fib, chronic, currently on Heparin, + HTN which is okay with renal issues 6. Dispo- patient extubated yesterday, OR cultures remain negative to date continue ABX for now, leave chest tubes in place if output remains minimal may be able to remove 1 chest tube tomorrow, care per primary   LOS: 6 days    BARRETT, ERIN 12/01/2016  DC pCA Cont heparin, start eliquis agter tubes out prob DC ant chest tube in am if no change patient examined and medical record reviewed,agree with above note. Tharon Aquas Trigt III 12/01/2016

## 2016-12-02 ENCOUNTER — Inpatient Hospital Stay (HOSPITAL_COMMUNITY): Payer: Medicare Other

## 2016-12-02 DIAGNOSIS — J869 Pyothorax without fistula: Secondary | ICD-10-CM

## 2016-12-02 DIAGNOSIS — Z9889 Other specified postprocedural states: Secondary | ICD-10-CM

## 2016-12-02 LAB — CBC
HEMATOCRIT: 23 % — AB (ref 36.0–46.0)
HEMOGLOBIN: 8 g/dL — AB (ref 12.0–15.0)
MCH: 31.5 pg (ref 26.0–34.0)
MCHC: 34.8 g/dL (ref 30.0–36.0)
MCV: 90.6 fL (ref 78.0–100.0)
Platelets: 257 10*3/uL (ref 150–400)
RBC: 2.54 MIL/uL — ABNORMAL LOW (ref 3.87–5.11)
RDW: 14.8 % (ref 11.5–15.5)
WBC: 12.8 10*3/uL — ABNORMAL HIGH (ref 4.0–10.5)

## 2016-12-02 LAB — URINE CULTURE: Culture: NO GROWTH

## 2016-12-02 LAB — BPAM RBC
Blood Product Expiration Date: 201812022359
Blood Product Expiration Date: 201812022359
ISSUE DATE / TIME: 201811061705
ISSUE DATE / TIME: 201811061705
Unit Type and Rh: 5100
Unit Type and Rh: 5100

## 2016-12-02 LAB — BASIC METABOLIC PANEL
ANION GAP: 13 (ref 5–15)
BUN: 165 mg/dL — AB (ref 6–20)
CO2: 20 mmol/L — ABNORMAL LOW (ref 22–32)
Calcium: 7.9 mg/dL — ABNORMAL LOW (ref 8.9–10.3)
Chloride: 100 mmol/L — ABNORMAL LOW (ref 101–111)
Creatinine, Ser: 6.71 mg/dL — ABNORMAL HIGH (ref 0.44–1.00)
GFR, EST AFRICAN AMERICAN: 6 mL/min — AB (ref >60)
GFR, EST NON AFRICAN AMERICAN: 6 mL/min — AB (ref >60)
Glucose, Bld: 143 mg/dL — ABNORMAL HIGH (ref 65–99)
POTASSIUM: 4.4 mmol/L (ref 3.5–5.1)
SODIUM: 133 mmol/L — AB (ref 135–145)

## 2016-12-02 LAB — TYPE AND SCREEN
ABO/RH(D): O POS
Antibody Screen: NEGATIVE
Unit division: 0
Unit division: 0

## 2016-12-02 LAB — GLUCOSE, CAPILLARY
GLUCOSE-CAPILLARY: 184 mg/dL — AB (ref 65–99)
GLUCOSE-CAPILLARY: 263 mg/dL — AB (ref 65–99)
Glucose-Capillary: 138 mg/dL — ABNORMAL HIGH (ref 65–99)
Glucose-Capillary: 209 mg/dL — ABNORMAL HIGH (ref 65–99)

## 2016-12-02 LAB — HEPARIN LEVEL (UNFRACTIONATED): HEPARIN UNFRACTIONATED: 0.42 [IU]/mL (ref 0.30–0.70)

## 2016-12-02 LAB — PHOSPHORUS: PHOSPHORUS: 8.6 mg/dL — AB (ref 2.5–4.6)

## 2016-12-02 LAB — MAGNESIUM: MAGNESIUM: 2.2 mg/dL (ref 1.7–2.4)

## 2016-12-02 MED ORDER — HYDROCODONE-ACETAMINOPHEN 5-325 MG PO TABS
1.0000 | ORAL_TABLET | Freq: Four times a day (QID) | ORAL | Status: DC | PRN
  Administered 2016-12-02 – 2016-12-07 (×4): 1 via ORAL
  Filled 2016-12-02 (×4): qty 1

## 2016-12-02 MED ORDER — LACTULOSE 10 GM/15ML PO SOLN: 20.0000 g | Freq: Every day | ORAL | Status: DC | PRN

## 2016-12-02 NOTE — Progress Notes (Signed)
Family Medicine Teaching Service Daily Progress Note Intern Pager: [phone]  Patient name: Christine Santos Medical record number: [id number] Date of birth: 19-Apr-1945 Age: 71 y.o. Gender: female  Primary Care Provider: [name], MD Consultants: Pulmonology  Code Status: Full   Pt Overview and Major Events to Date:  11/2: Patient admitted for acute hypoxic respiratory failure, treatment started for PNA  11/3: Pulmonology consulted when CT scan chest showed substantial consolidation of left lung with a moderate to large left pleural effusion  11/5 Thoracentesis 11/6 VATS with chest tube placement 11/7 extubated, off pressors   Assessment and Plan: Christine Santos is a 71 y.o. female presenting with cough and dyspnea. PMH is significant for T2DM with peripheral neuropathy, CLL s/p chemotherapy, HTN, CKDVI, CAD s/p CABG, hx CVA in 2017, hx of afib on eliquis.   Pneumonia: Pt is alert. Breathing improved. Seated up in chart. S/p VATS 11/6. Blood cultures NGTD. Wound cultures NGTD. Acid fast smears negative. WBC Stable at 12.8 . Afebrile. CXR continues to show improvement of left lobe infiltrate from yesterday. Chest tube in place, drained 180cc yesterday. TCTS may d/c 1 chest tube today. Currently on Heparin for anticoagulation. Sating well on 4L O2 by [place]. Pt reports pain 7/10, otherwise no complaints of pain. Will increase norco.  - Levaquin 750 mg (11/2- ), azactam (11/5 - )  - xopenox for elevated RR, tessalon perles prn - supplemental O2 to maintain O2 sats >/= 92%  - trend CBC  - pulmonology recommendations appreciated.  - TCTS recommendations appreciated - tylenol scheduled - norco 5-325, 1-2 q6h prn   AKI on CKD stage 4. Cr continues to worsen to 7.35 > 6.71. Baseline Cr ~3.5. AKI. Gentle fluid hydration initiated per Nephrology. UOP improved 945 mL past 24 hrs - appreciate Nephrology recommendations  - monitor CBC and BMP - avoid nephrotoxic medications - holding torsemide    Anemia Likely ABLA on Chronic anemia. Stable at 8.0. Transfusion threshold 8.0. Hgb 10.0 on admit. Hg baseline ~10.  - Follow Hg - hold iron due to PNA  H/o diastolic CHF w/ h/o CAD s/p CABG 1996. Weights. Hemodynamically stable. Metoprolol 25 mg BID. HR stable 79-117 last 24hrs. At home taking torsemide daily, alternating doses of 50 mg and 100 mg; metoprolol tartrate 12.5 mg BID; norvasc 10 mg daily; atorvastatin 80 mg daily. Last echo 2017 showed EF of 55-60% with G1DD. Troponin trend: 0.83>0.84>0.67.  - hold torsemide due to AKI.  - Continue metoprolol, norvasc, atorvastatin - I/Os - Daily weights   Afib:  HR < 115. Rate control with metoprolol 25 mg BID. Holding eliquis, and instead on Heparin.  - telemitry  T2DM CBGs stable. Last a1c 6.0 on 07/06/16, at home on lantus 37U daily with 5U humalog tid with meals. - monitor CBGs qACHS - SSI, lantus 15 U  HTN: Normotensive. Hypertensive this morning, may can restart home meds tomorrow. At home on norvasc, metoprolol, torsemide. - continue metoprolol and norvasc - hold torsemide  HLD at home on fenofibrate and atorvastatin.last lipid panel 09/04/15. LDL 99. Triglycerides 188. - continue home meds  GERDat home on protonix - continue home med  Goutat home on allopurinol  - continue home med  Anxiety at home on paxil 20 mg daily - stopping due to urinary retention   PT/OT - PT recommended SNF   FEN/GI: Carb modified.  100 mL/hr DD1/2NS Prophylaxis: Heparin  Disposition: Inpatient pending respiratory improvement  Subjective:  Patient feels better. Pain 7/10. Primarily in  tail bone, but this is chronic. On 4L O2 [place].   Objective: Temp:  [97.7 F (36.5 C)-98.4 F (36.9 C)] 97.7 F (36.5 C) (11/09 0358) Pulse Rate:  [68-107] 83 (11/09 0600) Resp:  [12-25] 12 (11/09 0600) BP: (106-165)/(53-143) 106/69 (11/09 0600) SpO2:  [89 %-99 %] 97 % (11/09 0600)  Physical Exam: General: elderly female sitting in chair,  NAD Cardiovascular: regular rate, regular rhythm. No murmur appreciated.  Respiratory: CTA bilaterally, Slight decreased breath sounds on Left lower field, No increased WOB on 4L O2 [place].  Abdomen: soft, NTND   Laboratory: Recent Labs  Lab 11/29/16 0230 11/30/16 0423 12/01/16 0402  WBC 15.5* 16.6* 11.6*  HGB 9.3* 8.6* 7.9*  HCT 27.1* 25.3* 22.9*  PLT 246 264 239   Recent Labs  Lab 11/25/16 1503  11/28/16 1032 11/28/16 2108 11/29/16 0230 11/30/16 0423 12/01/16 0402  NA 136   < >  --  132* 132* 132* 131*  K 3.6   < >  --  4.2 4.4 4.8 4.4  CL 98*   < >  --  93* 95* 97* 98*  CO2 25   < >  --  21* 21* 19* 19*  BUN 65*   < >  --  130* 136* 146* 154*  CREATININE 3.68*   < >  --  6.60* 6.54* 6.92* 7.35*  CALCIUM 8.9   < >  --  8.3* 8.0* 7.9* 7.6*  PROT 5.8*  --  5.9* 5.7*  --   --  4.7*  BILITOT 0.9  --   --  0.9  --   --  0.8  ALKPHOS 61  --   --  71  --   --  81  ALT 24  --   --  37  --   --  32  AST 36  --   --  52*  --   --  42*  GLUCOSE 160*   < >  --  200* 164* 244* 163*   < > = values in this interval not displayed.     Imaging/Diagnostic Tests: Dg Chest 2 View  Result Date: 11/25/2016 CLINICAL DATA:  71 y/o  F; chest pain with cough and hemoptysis. EXAM: CHEST  2 VIEW COMPARISON:  04/07/2014 PET-CT.  01/10/2009 chest radiograph. FINDINGS: Mild cardiomegaly. Aortic atherosclerosis with calcification. Status post CABG. Median sternotomy wires are aligned. Extensive consolidations throughout the left mid and lower lung zones. Small to moderate left effusion. Right upper quadrant cholecystectomy clips. Left humerus rotator cuff repair anchor. Multilevel degenerative changes of the spine. IMPRESSION: Extensive consolidation throughout left lung and small to moderate left effusion probably representing pneumonia. Follow-up to resolution is recommended to exclude underlying malignancy. Electronically Signed   By: Kristine Garbe M.D.   On: 11/25/2016 15:23   Ct Chest Wo  Contrast  Result Date: 11/26/2016 CLINICAL DATA:  Patient with respiratory failure. EXAM: CT CHEST WITHOUT CONTRAST TECHNIQUE: Multidetector CT imaging of the chest was performed following the standard protocol without IV contrast. COMPARISON:  Chest radiograph 11/25/2016. FINDINGS: Cardiovascular: Heart is enlarged. No pericardial effusion. Coronary arterial vascular calcifications. Thoracic aortic vascular calcifications. Stable fluid density structure along the anterior right aspect of the mediastinum (image 75; series 3). Mediastinum/Nodes: Multiple prominent subcentimeter mediastinal lymph nodes are visualized. The esophagus is unremarkable. Abarrent right subclavian vein. Lungs/Pleura: Bowing of the posterior trachea secondary to expiratory phase imaging. There is a a moderate complex left pleural effusion which appears partially loculated. There is extensive  consolidation throughout the left lung. Minimal aerated left upper lobe. Patchy ground-glass and consolidative opacities demonstrated throughout the right upper, right middle and right lower lobes. Trace right pleural fluid. No pneumothorax. Upper Abdomen: Status post cholecystectomy.  No acute process. Musculoskeletal: Thoracic spine degenerative changes. No aggressive or acute appearing osseous lesions. IMPRESSION: 1. There is extensive consolidation throughout the left lung which may be secondary to pneumonia in the appropriate clinical setting. There is a complex appearing loculated moderate left pleural effusion. Recommend radiographic followup to ensure resolution. 2. Patchy ground-glass and consolidative opacities scattered throughout the right lung which may be secondary to an infectious or inflammatory process. Recommend attention on follow-up. 3. Multiple prominent subcentimeter superior mediastinal lymph nodes are nonspecific however likely reactive in etiology. Recommend attention on follow-up. 4. Cardiomegaly.  Coronary arterial  atherosclerosis. 5. Aortic Atherosclerosis (ICD10-I70.0). Electronically Signed   By: Lovey Newcomer M.D.   On: 11/26/2016 14:52   Dg Chest Port 1 View  Result Date: 12/01/2016 CLINICAL DATA:  Status post VATS for left empyema. EXAM: PORTABLE CHEST 1 VIEW COMPARISON:  11/30/2016 FINDINGS: Two left thoracostomy tubes and left IJ central venous catheter with tip overlying the mid-upper SVC again noted. An endotracheal tube is been removed. Left pleural and parenchymal opacities are unchanged. There is no evidence of pneumothorax. Right basilar atelectasis is again noted. Cardiomegaly and CABG changes again identified. IMPRESSION: Endotracheal tube removal without other significant change. Support apparatus as described. No evidence of pneumothorax. Electronically Signed   By: Margarette Canada M.D.   On: 12/01/2016 08:47   Dg Chest Port 1 View  Result Date: 11/30/2016 CLINICAL DATA:  Hypoxia EXAM: PORTABLE CHEST 1 VIEW COMPARISON:  November 29, 2016 FINDINGS: Endotracheal tube tip is 2.3 cm above the carina. Central catheter tip is at the junction of the left innominate vein and superior vena cava. There are chest tubes on the left, unchanged in position. No pneumothorax. Areas of consolidation throughout much of the left lung remain. No new opacity evident. No cavitation noted on the left. Right lung appears clear. There is cardiomegaly with pulmonary vascularity within normal limits. No adenopathy. Patient is status post coronary artery bypass grafting. There is postoperative change in the left shoulder. IMPRESSION: Tube and catheter positions without evident pneumothorax. Extensive airspace consolidation on the left is stable. No new opacity on either side. Right lungs clear. Stable cardiac enlargement. Electronically Signed   By: Lowella Grip III M.D.   On: 11/30/2016 08:50   Dg Chest Port 1 View  Result Date: 11/29/2016 CLINICAL DATA:  Empyema of the lung.  Surgery follow-up. EXAM: PORTABLE CHEST 1 VIEW  COMPARISON:  11/29/2016 FINDINGS: An endotracheal tube has been placed with tip measuring 1.9 cm above the carina. A left central venous catheter was placed with tip directed somewhat horizontally in the mid right mediastinum, likely representing location of the confluence of the brachiocephalic and superior vena cava veins. 2 left chest tubes. Postoperative changes in the mediastinum. Shallow inspiration. Cardiac enlargement without vascular congestion. Consolidation in the left mid and lower lung zones is improved since previous study. Probable evacuation of pleural fluid collection. No visible pneumothorax. Surgical clips in the right upper quadrant. Loop recorder. Degenerative changes in the spine and shoulders. IMPRESSION: Appliances appear in satisfactory position. Shallow inspiration. Cardiac enlargement. Improved left lung consolidation. Interval evacuation of left pleural fluid collection. Electronically Signed   By: Lucienne Capers M.D.   On: 11/29/2016 22:42   Dg Chest Port 1 View  Result Date:  11/29/2016 CLINICAL DATA:  Shortness of breath EXAM: PORTABLE CHEST 1 VIEW COMPARISON:  Portable chest x-ray of 11/28/2016, and CT chest of 11/26/2016 FINDINGS: There is little change in opacity at the left lung base consistent with atelectasis, pneumonia, and apparently loculated left pleural effusion. The right lung is grossly clear by chest x-ray. Mild cardiomegaly is stable. Median sternotomy sutures are noted from prior CABG. No acute bony abnormality is seen. The loop recorder is no longer seen. IMPRESSION: 1. Persistent opacity at the left lung base consistent with atelectasis, pneumonia, in apparently loculated left pleural effusion. 2. Stable cardiomegaly Electronically Signed   By: Ivar Drape M.D.   On: 11/29/2016 10:33   Dg Chest Port 1 View  Result Date: 11/28/2016 CLINICAL DATA:  71 year old female status post left side thoracentesis earlier this hour. EXAM: PORTABLE CHEST 1 VIEW COMPARISON:   0545 hours today and earlier. FINDINGS: Portable AP upright view at 1032 hours. No left pneumothorax. Continued subtotal opacification of the left hemithorax, ventilation not significantly changed. Visible mediastinal contours are stable. Cardiomegaly. Prior CABG. Left chest cardiac event recorder. Right lung appears stable and clear when allowing for portable technique. Negative visible bowel gas pattern. IMPRESSION: No pneumothorax or adverse features status post left side thoracentesis. Electronically Signed   By: Genevie Ann M.D.   On: 11/28/2016 10:53   Dg Chest Port 1 View  Result Date: 11/28/2016 CLINICAL DATA:  Shortness of breath.  Pneumonia EXAM: PORTABLE CHEST 1 VIEW COMPARISON:  Chest CT 2 days ago. FINDINGS: Extensive opacification of the left lung which is a combination of loculated pleural fluid and atelectasis/ consolidation. Cardiomegaly. Status post CABG. There is an implantable loop recorder. The right lung remains clear. IMPRESSION: 1. History of pneumonia with unchanged extensive left lung consolidation and complex pleural effusion. 2. Cardiomegaly. Electronically Signed   By: Monte Fantasia M.D.   On: 11/28/2016 08:04    Bonnita Hollow, MD 12/02/2016, 6:23 AM PGY-1, [place] Intern pager: [phone], text pages welcome

## 2016-12-02 NOTE — Progress Notes (Signed)
      [address]       [place] [address]             [phone]      Up in chair  BP 118/72   Pulse 89   Temp 97.7 F (36.5 C) (Oral)   Resp 17   Ht 5' 5.5" (1.664 m)   Wt 189 lb 13.1 oz (86.1 kg)   SpO2 96%   BMI 31.11 kg/m    Intake/Output Summary (Last 24 hours) at 12/02/2016 1803 Last data filed at 12/02/2016 1117 Gross per 24 hour  Intake 400.5 ml  Output 875 ml  Net -474.5 ml   Stable day, continue current care  Garry Bochicchio C. Roxan Hockey, MD [facility] [phone]

## 2016-12-02 NOTE — Progress Notes (Signed)
PULMONARY / CRITICAL CARE MEDICINE   Name: Christine Santos MRN: [id number] DOB: May 02, 1945    ADMISSION DATE:  11/25/2016  HISTORY OF PRESENT ILLNESS:   19yoF with hx CLL, CHF, CKD Stage V, Afib (on eliquis), CVA, CAD, who was admitted 11/25/16 after 2 days of SOB, Productive cough, and small volume hemoptysis at home. She was treated with Levoflox 11/2-11/3 . She was on Eliquis (last dose 11/3 AM) which was held. Chest CT showed diffuse dense infiltrates within left lung with loculated appearing moderate sized left pleural effusion.  Underwent VATs 11/6 for left empyema  Significant events reviewed 11/5 thora >> 50 cc cloudy fluid 11/6 VATS decortication  Subjective Renal fxn better Pos balance Mobilizing  VITAL SIGNS: BP 124/60   Pulse 88   Temp 97.8 F (36.6 C) (Oral)   Resp 16   Ht 5' 5.5" (1.664 m)   Wt 86.1 kg (189 lb 13.1 oz)   SpO2 98%   BMI 31.11 kg/m   INTAKE / OUTPUT: I/O last 3 completed shifts: In: 604.8 [P.O.:120; I.V.:284.8; IV Piggyback:200] Out: 1411 [Urine:1181; Chest Tube:230]  PHYSICAL EXAMINATION: General: awake, alert, no distress Neuro: nonfocal, weka HEENT: jvd wnl PULM: coarse, chest tubes CV:  s1 s2 RRR no  GI: soft, obese Extremities: no rash     LABS:  BMET Recent Labs  Lab 11/30/16 0423 12/01/16 0402 12/02/16 0355  NA 132* 131* 133*  K 4.8 4.4 4.4  CL 97* 98* 100*  CO2 19* 19* 20*  BUN 146* 154* 165*  CREATININE 6.92* 7.35* 6.71*  GLUCOSE 244* 163* 143*   Electrolytes Recent Labs  Lab 11/28/16 0427  11/29/16 0230 11/30/16 0423 12/01/16 0402 12/02/16 0355  CALCIUM 8.2*   < > 8.0* 7.9* 7.6* 7.9*  MG 1.8  --  2.0  --   --  2.2  PHOS 6.2*  --   --   --   --  8.6*   < > = values in this interval not displayed.   CBC Recent Labs  Lab 11/30/16 0423 12/01/16 0402 12/02/16 0355  WBC 16.6* 11.6* 12.8*  HGB 8.6* 7.9* 8.0*  HCT 25.3* 22.9* 23.0*  PLT 264 239 257   Coag's Recent Labs  Lab 11/28/16 2108  11/29/16 0230 11/30/16 1655 12/01/16 0402  APTT 134* 71* 63* 72*  INR 1.33  --   --   --     Sepsis Markers Recent Labs  Lab 11/26/16 1524 11/26/16 1820 11/27/16 0949 11/28/16 0427 11/29/16 0230  LATICACIDVEN 1.0 1.1  --  0.8  --   PROCALCITON  --   --  16.71 14.23 8.63   ABG Recent Labs  Lab 11/26/16 1552 11/28/16 2338  PHART 7.427 7.393  PCO2ART 39.3 35.7  PO2ART 60.6* 64.0*   Liver Enzymes Recent Labs  Lab 11/25/16 1503 11/28/16 0427 11/28/16 2108 12/01/16 0402  AST 36  --  52* 42*  ALT 24  --  37 32  ALKPHOS 61  --  71 81  BILITOT 0.9  --  0.9 0.8  ALBUMIN 2.7* 2.2* 2.2* 1.9*   Cardiac Enzymes Recent Labs  Lab 11/25/16 2045 11/26/16 0229 11/26/16 1409  TROPONINI 0.83* 0.84* 0.67*   Glucose Recent Labs  Lab 12/01/16 0324 12/01/16 0845 12/01/16 1204 12/01/16 1627 12/01/16 2115 12/02/16 0746  GLUCAP 161* 171* 186* 168* 156* 138*   STUDIES:  Chest CT (11/26/16):  1. There is extensive consolidation throughout the left lung which may be secondary to pneumonia in the appropriate  clinical setting. There is a complex appearing loculated moderate left pleural effusion.  2. Patchy ground-glass and consolidative opacities scattered throughout the right lung  3. Multiple prominent subcentimeter superior mediastinal lymph nodes are nonspecific however likely reactive in etiology. 4. Cardiomegaly.  Coronary arterial atherosclerosis. 5. Aortic Atherosclerosis (ICD10-I70.0).  CULTURES: Blood cultures (11/3): no growth Urine strep ag POS Body fluid culture 11/30/2016>>   ANTIBIOTICS: Levofloxacin 11/2>>11/3 Aztreonam 11/4 >> levaquin 11/4 >>  LINES/TUBES: 11/30/2016 left central venous line>> 11/30/2016 left chest tube>>  ASSESSMENT / PLAN: 61yoF with hx CLL, CHF, CKD Stage V, Afib (on eliquis), CVA, CAD, who was admitted 11/25/16 with  Dense Left-sided Pneumonia with  empyema  and Acute Hypoxic Respiratory failure   PULMONARY Pneumococcal  Pneumonia with empyema s/p VATS 11/6 Acute Hypoxic respiratory failure:  -continue ambulation -Dc chest tube per cvts -follow micro to final -pcxr follow up in am  -IS as able  Endocrine: CBG (last 3)  Recent Labs    12/01/16 1627 12/01/16 2115 12/02/16 0746  GLUCAP 168* 156* 138*    Type 2 diabetes -Sliding scale insulin  RENAL Lab Results  Component Value Date   CREATININE 6.71 (H) 12/02/2016   CREATININE 7.35 (H) 12/01/2016   CREATININE 6.92 (H) 11/30/2016   CREATININE 3.4 (HH) 09/06/2016   CREATININE 3.18 (H) 03/30/2016   CREATININE 3.29 (H) 11/25/2015   CREATININE 2.89 (H) 11/19/2015   CREATININE 5.5 (HH) 09/08/2015   CREATININE 3.0 (HH) 03/10/2015   Recent Labs  Lab 11/30/16 0423 12/01/16 0402 12/02/16 0355  K 4.8 4.4 4.4    1. AKI-on-CKD Stage V: ATN -urine osm, na support allowing pos balance further -no lasix for now  HEMATOLOGIC Recent Labs    12/01/16 0402 12/02/16 0355  HGB 7.9* 8.0*   History of CLL Monitor hemoglobin post surgical intervention   Will sign off ,call if needed Consider narrow to one abx course  Lavon Paganini. Titus Mould, MD, [place] Pgr: [facility] 12/02/2016 11:24 AM

## 2016-12-02 NOTE — Clinical Social Work Note (Signed)
Clinical Social Work Assessment  Patient Details  Name: Christine Santos MRN: [id number] Date of Birth: 1945-06-03  Date of referral:  12/02/16               Reason for consult:  Facility Placement                Permission sought to share information with:  Family Supports Permission granted to share information::  Yes, Verbal Permission Granted  Name::     [name] (family friend )  Agency::     Relationship::     Contact Information:     Housing/Transportation Living arrangements for the past 2 months:  Single Family Home(with [name] ) Source of Information:  Patient Patient Interpreter Needed:  None Criminal Activity/Legal Involvement Pertinent to Current Situation/Hospitalization:  No - Comment as needed Significant Relationships:  Adult Children, Friend Lives with:  Friends Do you feel safe going back to the place where you live?  Yes Need for family participation in patient care:  Yes (Comment)  Care giving concerns:  CSW spoke with pt at bedside. At this time no concerns have been presented to [place].    Social Worker assessment / plan: CSW spoke with pt at bedside. During this time CSW was made aware that pt is from home where pt has been living with pt's friend [name]. 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 Pt has two sons Legrand Como and Randall Hiss who are involved in pt's care as much as possible. Pt reports that she would like to go home at the time of discharge but states "I gotta do what I gotta do". Pt gave CSW permission to seek placement just in case it is needed.   Employment status:  Retired Nurse, adult PT Recommendations:  [place], [place] go home if pt progresses rapidly. ) Information / Referral to community resources:  [place]  Patient/Family's Response to care:  Pt is understanding and agreeable to plan of care at this time.   Patient/Family's  Understanding of and Emotional Response to Diagnosis, Current Treatment, and Prognosis:  No further questions or concerns have been presented to CSW at this time.  Emotional Assessment Appearance:  Appears stated age Attitude/Demeanor/Rapport:    Affect (typically observed):  Pleasant Orientation:  Oriented to Self, Oriented to Place, Oriented to  Time, Oriented to Situation Alcohol / Substance use:  Not Applicable Psych involvement (Current and /or in the community):  No (Comment)  Discharge Needs  Concerns to be addressed:  No discharge needs identified Readmission within the last 30 days:  No Current discharge risk:  None Barriers to Discharge:  No Barriers Identified   Wetzel Bjorn[place], [place] 12/02/2016, 10:00 AM

## 2016-12-02 NOTE — Progress Notes (Signed)
Subjective: Doing well today, has been ambulating some. Denies current complaints.   Objective: Vital signs in last 24 hours: Temp:  [97.7 F (36.5 C)-98.4 F (36.9 C)] 97.8 F (36.6 C) (11/09 0750) Pulse Rate:  [68-104] 93 (11/09 0900) Resp:  [12-25] 15 (11/09 0900) BP: (106-163)/(53-102) 147/70 (11/09 0900) SpO2:  [89 %-100 %] 97 % (11/09 0900) Weight change:   Intake/Output from previous day: 11/08 0701 - 11/09 0700 In: 482.5 [P.O.:120; I.V.:212.5; IV Piggyback:150] Out: 1125 [Urine:945; Chest Tube:180] Intake/Output this shift: Total I/O In: 240 [P.O.:240] Out: 120 [Urine:120]  General appearance: alert, cooperative and no distress Resp: diminished breath sounds anterior - bilateral and normal work of breathing Cardio: irregularly irregular rhythm GI: soft, non-tender; bowel sounds normal; no masses,  no organomegaly Extremities: edema trace Skin: Skin color, texture, turgor normal. No rashes or lesions Neurologic: Grossly normal.   Lab Results: Recent Labs    12/01/16 0402 12/02/16 0355  WBC 11.6* 12.8*  HGB 7.9* 8.0*  HCT 22.9* 23.0*  PLT 239 257   BMET:  Recent Labs    12/01/16 0402 12/02/16 0355  NA 131* 133*  K 4.4 4.4  CL 98* 100*  CO2 19* 20*  GLUCOSE 163* 143*  BUN 154* 165*  CREATININE 7.35* 6.71*  CALCIUM 7.6* 7.9*   No results for input(s): PTH in the last 72 hours. Iron Studies: No results for input(s): IRON, TIBC, TRANSFERRIN, FERRITIN in the last 72 hours.  Studies/Results: Dg Chest Port 1 View  Result Date: 12/02/2016 CLINICAL DATA:  Chest tube, empyema and shortness-of-breath. EXAM: PORTABLE CHEST 1 VIEW COMPARISON:  12/01/2016 FINDINGS: Sternotomy wires unchanged. Left IJ central venous catheter unchanged with tip obliquely warranted over the SVC. Left basilar chest tube unchanged. Second catheter over the left thorax with tip towards the left apex unchanged. Moderate opacification of the left lung with slight interval worsening.  Right lung is clear. Stable cardiomegaly. Remainder the exam is unchanged. IMPRESSION: Interval worsening opacification of the left lung compatible with known pleural fluid collection/empyema and associated atelectasis. Stable cardiomegaly. Tubes and lines as described. Electronically Signed   By: Marin Olp M.D.   On: 12/02/2016 07:26   Dg Chest Port 1 View  Result Date: 12/01/2016 CLINICAL DATA:  Status post VATS for left empyema. EXAM: PORTABLE CHEST 1 VIEW COMPARISON:  11/30/2016 FINDINGS: Two left thoracostomy tubes and left IJ central venous catheter with tip overlying the mid-upper SVC again noted. An endotracheal tube is been removed. Left pleural and parenchymal opacities are unchanged. There is no evidence of pneumothorax. Right basilar atelectasis is again noted. Cardiomegaly and CABG changes again identified. IMPRESSION: Endotracheal tube removal without other significant change. Support apparatus as described. No evidence of pneumothorax. Electronically Signed   By: Margarette Canada M.D.   On: 12/01/2016 08:47    Scheduled: . acetaminophen  1,000 mg Oral Q6H   Or  . acetaminophen (TYLENOL) oral liquid 160 mg/5 mL  1,000 mg Oral Q6H  . amLODipine  10 mg Oral Daily  . atorvastatin  80 mg Oral q1800  . benzonatate  100 mg Oral TID  . bisacodyl  10 mg Oral Daily  . Chlorhexidine Gluconate Cloth  6 each Topical Daily  . dextromethorphan-guaiFENesin  1 tablet Oral BID  . insulin aspart  0-9 Units Subcutaneous TID WC  . mouth rinse  15 mL Mouth Rinse BID  . metoprolol tartrate  25 mg Oral BID  . pantoprazole (PROTONIX) IV  40 mg Intravenous Q24H  . senna-docusate  1 tablet Oral QHS  . sodium chloride flush  10-40 mL Intracatheter Q12H    Assessment/Plan: Pt is a 71 y.o. yo female with a PMHX of CKDIV, CLL s/p chemo, T2DM, HTN, CAD s/p CABG, prior CVA in 2017, afib on eliquis, was admitted to [facility] on 11/25/2016 with pneumonia and pleural effusion. She has had worsening kidney function  during her stay.    1. Acute on chronic kidney disease- Creatinine has plateaued and is now decreasing. UOP increasing. All positive signs. Baseline appears to be about 3. Will await return of renal function.  -continue to hold torsemide -continue IV fluids -monitor renal function closely -monitor UOP closely  -daily weights, strict I/Os -order to remove Foley placed  2. Pneumonia w/ complex loculated left pleural effusion- continue antibiotics, s/p thoracentesis and VATS, pleural fluid w/ no growth  3. Afib w/ RVR- continue metoprolol, hold eliquis 2/2 hemoptysis  4. T2DM- per primary  5. HTN- stable, continue hydralazine and norvasc  6. ?UTI- Foley in, large leuks in UA- send urine for culture. Already on Levo and aztreonam -ucx pending  Dispo: pending clinical improvement, likely SNF     LOS: 7 days   Steve Rattler 12/02/2016,10:53 AM

## 2016-12-02 NOTE — NC FL2 (Signed)
[place] LEVEL OF CARE SCREENING TOOL     IDENTIFICATION  Patient Name: Christine Santos Birthdate: [birth year] Sex: female Admission Date (Current Location): 11/25/2016  [facility] and [place] Number:  Herbalist and Address:  [facility] [place], [place] [address] [place],  [address]      Provider Number: [id number]  Attending Physician Name and Address:  Alveda Reasons, MD  Relative Name and Phone Number:       Current Level of Care: Hospital Recommended Level of Care: [place] Prior Approval Number:    Date Approved/Denied:   PASRR Number:   [id number]   Discharge Plan: SNF    Current Diagnoses: Patient Active Problem List   Diagnosis Date Noted  . Cough   . Empyema lung ([place]) 11/29/2016  . SOB (shortness of breath)   . Streptococcus pneumoniae   . Loculated pleural effusion   . Chest pain   . Community acquired pneumonia of left lower lobe of lung ([place])   . Elevated troponin   . Hypoxia 11/25/2016  . Estrogen deficiency 10/16/2016  . History of fracture 07/21/2016  . Urinary tract infection 07/06/2016  . Paroxysmal atrial fibrillation ([place]) 10/22/2015  . Acute on chronic kidney failure ([place]) 09/10/2015  . Acute lacunar infarction 09/03/2015  . Sinusitis, chronic 08/05/2015  . Weight loss, unintentional 07/02/2015  . Diabetes mellitus type 2 with neurological manifestations ([place]) 02/26/2014  . Hyperkalemia 06/21/2013  . Diabetic nephropathy ([place]) 05/23/2013  . Diabetic neuropathy ([place]) 11/30/2012  . Macular degeneration 07/04/2012  . Diabetic retinopathy ([place]) 03/20/2012  . Osteopenia 09/22/2011  . Foot pain 09/21/2011  . Anemia in chronic kidney disease 08/29/2011  . Gout 04/13/2011  . Stress incontinence, female 11/11/2010  . Anxiety 08/18/2010  . Chronic systolic heart failure ([place]) 02/18/2009  . SINUS BRADYCARDIA 10/29/2008  . Peripheral vascular disease ([place]) 06/18/2008  . Chronic  kidney disease, stage IV (severe) ([place]) 07/13/2007  . Chronic lymphocytic leukemia ([place]) 10/13/2006  . Multiple complications of type II diabetes mellitus ([place]) 03/23/2006  . HYPERCHOLESTEROLEMIA 03/23/2006  . OBESITY, NOS 03/23/2006  . Essential hypertension 03/23/2006  . Coronary atherosclerosis 03/23/2006  . REFLUX ESOPHAGITIS 03/23/2006  . IRRITABLE BOWEL SYNDROME 03/23/2006  . CYSTOCELE/RECTOCELE/PROLAPSE,UNSPEC. 03/23/2006  . Osteoarthritis, multiple sites 03/23/2006    Orientation RESPIRATION BLADDER Height & Weight     Self, Time, Situation, Place  O2(Nasal Cannula, 4L. ) Continent Weight: 189 lb 13.1 oz (86.1 kg) Height:  5' 5.5" (166.4 cm)  BEHAVIORAL SYMPTOMS/MOOD NEUROLOGICAL BOWEL NUTRITION STATUS      Continent Diet(please see discharge summary )  AMBULATORY STATUS COMMUNICATION OF NEEDS Skin   Limited Assist Verbally Surgical wounds(on the chest. )                       Personal Care Assistance Level of Assistance  Bathing, Feeding Bathing Assistance: Maximum assistance Feeding assistance: Maximum assistance       Functional Limitations Info  Sight, Hearing, Speech Sight Info: Adequate Hearing Info: Adequate Speech Info: Adequate    SPECIAL CARE FACTORS FREQUENCY  PT (By licensed PT), OT (By licensed OT)     PT Frequency: 5 times a week  OT Frequency: 5 times a week             Contractures Contractures Info: Not present    Additional Factors Info  Code Status, Allergies Code Status Info: Full Allergies Info: Other, Penicillins, Amoxicillin, Cephalexin, Codeine Phosphate  Current Medications (12/02/2016):  This is the current hospital active medication list Current Facility-Administered Medications  Medication Dose Route Frequency Provider Last Rate Last Dose  . acetaminophen (TYLENOL) tablet 1,000 mg  1,000 mg Oral Q6H Gold, Wayne E, PA-C   1,000 mg at 12/02/16 0503   Or  . acetaminophen (TYLENOL) solution 1,000 mg  1,000 mg  Oral Q6H Gold, Wayne E, PA-C      . amLODipine (NORVASC) tablet 10 mg  10 mg Oral Daily Minor, Grace Bushy, NP   10 mg at 12/02/16 0935  . atorvastatin (LIPITOR) tablet 80 mg  80 mg Oral q1800 Rogue Bussing, MD   80 mg at 12/01/16 1708  . aztreonam (AZACTAM) 250 mg in dextrose 5 % 50 mL IVPB  250 mg Intravenous Q8H Laren Everts, RPH   Stopped at 12/02/16 0057  . benzonatate (TESSALON) capsule 100 mg  100 mg Oral TID Hammonds, Sharyn Blitz, MD   100 mg at 12/02/16 0934  . bisacodyl (DULCOLAX) EC tablet 10 mg  10 mg Oral Daily Gold, Wayne E, PA-C   10 mg at 12/02/16 0935  . bupivacaine ON-Q pain pump   Other Continuous Gold, Wayne E, PA-C      . Chlorhexidine Gluconate Cloth 2 % PADS 6 each  6 each Topical Daily Alveda Reasons, MD   6 each at 12/02/16 0945  . dextromethorphan-guaiFENesin (MUCINEX DM) 30-600 MG per 12 hr tablet 1 tablet  1 tablet Oral BID Hammonds, Sharyn Blitz, MD   1 tablet at 12/02/16 0935  . fentaNYL (SUBLIMAZE) bolus via infusion 50 mcg  50 mcg Intravenous Q1H PRN Gold, Wayne E, PA-C      . heparin ADULT infusion 100 units/mL (25000 units/22mL sodium chloride 0.45%)  850 Units/hr Intravenous Continuous Alveda Reasons, MD 8.5 mL/hr at 12/01/16 1900 850 Units/hr at 12/01/16 1900  . HYDROcodone-acetaminophen (NORCO/VICODIN) 5-325 MG per tablet 1-2 tablet  1-2 tablet Oral Q6H PRN Bonnita Hollow, MD      . insulin aspart (novoLOG) injection 0-9 Units  0-9 Units Subcutaneous TID WC Rogue Bussing, MD   1 Units at 12/02/16 0820  . lactulose (CHRONULAC) 10 GM/15ML solution 20 g  20 g Oral Daily PRN Barrett, Erin R, PA-C      . levalbuterol (XOPENEX) nebulizer solution 0.63 mg  0.63 mg Inhalation Q6H PRN Rogue Bussing, MD      . levofloxacin (LEVAQUIN) IVPB 500 mg  500 mg Intravenous Q48H Laren Everts, RPH 100 mL/hr at 11/30/16 1715 500 mg at 11/30/16 1715  . MEDLINE mouth rinse  15 mL Mouth Rinse BID [place]cott Gum, Collier Salina, MD      . metoprolol tartrate  (LOPRESSOR) tablet 25 mg  25 mg Oral BID Rory Percy, DO   25 mg at 12/02/16 0934  . ondansetron (ZOFRAN) injection 4 mg  4 mg Intravenous Q6H PRN Gold, Wayne E, PA-C      . pantoprazole (PROTONIX) injection 40 mg  40 mg Intravenous Q24H Alveda Reasons, MD   40 mg at 12/02/16 0935  . potassium chloride 10 mEq in 50 mL *CENTRAL LINE* IVPB  10 mEq Intravenous Daily PRN Gold, Wayne E, PA-C      . senna-docusate (Senokot-S) tablet 1 tablet  1 tablet Oral QHS Jadene Pierini E, PA-C   1 tablet at 12/01/16 2129  . sodium chloride flush (NS) 0.9 % injection 10-40 mL  10-40 mL Intracatheter Q12H Alveda Reasons, MD   10 mL  at 12/01/16 0925  . sodium chloride flush (NS) 0.9 % injection 10-40 mL  10-40 mL Intracatheter PRN Alveda Reasons, MD         Discharge Medications: Please see discharge summary for a list of discharge medications.  Relevant Imaging Results:  Relevant Lab Results:   Additional Information SSN- [id number]  Wetzel Bjornstad, LCSWA

## 2016-12-02 NOTE — Plan of Care (Signed)
  Pain Managment: General experience of comfort will improve 12/02/2016 0546 - Progressing by Alonna Buckler, RN Note PRN medications effective in helping with patients pain. ON-Q intact, pt receiving scheduled tylenol.    Activity: Risk for activity intolerance will decrease 12/02/2016 0546 - Progressing by Alonna Buckler, RN Note Pt ambulated to the door of her room this morning with standing walker and was unable to walk farther due to weakness and buckling of her legs. Pt was assisted to chair and encouraged to use IS and flutter valve.

## 2016-12-02 NOTE — Progress Notes (Signed)
[place] for heparin  Indication: Atrial fibrillation  Allergies  Allergen Reactions  . Other     Darvocet  . Penicillins   . Amoxicillin Rash    Has patient had a PCN reaction causing immediate rash, facial/tongue/throat swelling, SOB or lightheadedness with hypotension: No Has patient had a PCN reaction causing severe rash involving mucus membranes or skin necrosis: No Has patient had a PCN reaction that required hospitalization: No Has patient had a PCN reaction occurring within the last 10 years: No If all of the above answers are "NO", then may proceed with Cephalosporin use.   . Cephalexin Rash  . Codeine Phosphate Rash    Patient Measurements: Height: 5' 5.5" (166.4 cm) Weight: 189 lb 13.1 oz (86.1 kg) IBW/kg (Calculated) : 58.15 Heparin Dosing Weight: 86.1 kg  Vital Signs: Temp: 97.7 F (36.5 C) (11/09 0358) Temp Source: Oral (11/09 0358) BP: 122/63 (11/09 0700) Pulse Rate: 83 (11/09 0700)  Labs: Recent Labs    11/30/16 0423 11/30/16 1655 12/01/16 0402 12/01/16 0403 12/02/16 0355 12/02/16 0408  HGB 8.6*  --  7.9*  --  8.0*  --   HCT 25.3*  --  22.9*  --  23.0*  --   PLT 264  --  239  --  257  --   APTT  --  63* 72*  --   --   --   HEPARINUNFRC  --  0.59  --  0.39  --  0.42  CREATININE 6.92*  --  7.35*  --  6.71*  --     Estimated Creatinine Clearance: 8.4 mL/min (A) (by C-G formula based on SCr of 6.71 mg/dL (H)).   Medical History: Past Medical History:  Diagnosis Date  . Anemia 08/29/2011  . Anxiety   . Arthritis    "legs" (01/22/2013)  . CAD (coronary artery disease)    s/p CABG in 1999  . Cataract   . Chronic kidney disease   . CLL (chronic lymphoblastic leukemia)   . Diastolic heart failure ([place])   . GERD (gastroesophageal reflux disease)   . H/O hiatal hernia   . Heart murmur   . Hyperlipidemia   . Hypertension   . IDDM (insulin dependent diabetes mellitus) ([place])   . PAD (peripheral artery  disease) ([place])   . Paroxysmal atrial fibrillation (H[facility])    a. identified on ILR as part of StrokeAF study  . Peripheral vascular disease ([place])   . Stroke (cerebrum) [facility])    Assessment: 71 yo female admitted on 11/2 with respiratory failure 2/2 PNA. Thoracentesis 11/5 and VATS procedure on 11/6.   Patient's afib is rate controlled with metoprolol>>currently on hold. Holding PTA Eliquis.  Last apix dose 11/3 @0915 . Heparin level (0.42) is within goal range this AM, on 850 units/hr. Hgb down slightly today from 8.6 to 7.9. Platelets remain within normal limits. No signs/symptoms of bleeding. No issues with infusion per nursing.  Per discussion with surgery, will not titrate heparin infusion over 1000 units/hr for 24 hours at least.   Goal of Therapy:  Heparin level 0.3-0.7 units/ml Monitor platelets by anticoagulation protocol: Yes   Plan:  Continue heparin at 850 units/hr -Do not titrate over 1000 units/hr per surgery request >> will clarify if can increase in future if needed -Monitor daily heparin level and CBC -Transition back to Eliquis soon?  Uvaldo Rising, BCPS  Clinical Pharmacist Pager [phone]  12/02/2016 7:49 AM

## 2016-12-02 NOTE — Progress Notes (Addendum)
[facility] DAILY ICU PROGRESS NOTE                   [address]            [place] [address]          [phone]   3 Days Post-Op Procedure(s) (LRB): VIDEO ASSISTED THORACOSCOPY (VATS)/EMPYEMA (Left)  Total Length of Stay:  LOS: 7 days   Subjective:  No new complaints.  Off oxygen, feeling okay.  Objective: Vital signs in last 24 hours: Temp:  [97.7 F (36.5 C)-98.4 F (36.9 C)] 97.8 F (36.6 C) (11/09 0750) Pulse Rate:  [68-104] 93 (11/09 0900) Cardiac Rhythm: Atrial fibrillation;Atrial flutter (11/09 0830) Resp:  [12-25] 15 (11/09 0900) BP: (106-163)/(53-102) 147/70 (11/09 0900) SpO2:  [89 %-100 %] 97 % (11/09 0900)  Filed Weights   11/29/16 0500 11/29/16 1335 11/30/16 0530  Weight: 178 lb 5.6 oz (80.9 kg) 178 lb 5.6 oz (80.9 kg) 189 lb 13.1 oz (86.1 kg)    Weight change:    Hemodynamic parameters for last 24 hours: CVP:  [4 mmHg-13 mmHg] 13 mmHg  Intake/Output from previous day: 11/08 0701 - 11/09 0700 In: 482.5 [P.O.:120; I.V.:212.5; IV Piggyback:150] Out: 1125 [Urine:945; Chest Tube:180]  Intake/Output this shift: Total I/O In: 240 [P.O.:240] Out: 120 [Urine:120]  Current Meds: Scheduled Meds: . acetaminophen  1,000 mg Oral Q6H   Or  . acetaminophen (TYLENOL) oral liquid 160 mg/5 mL  1,000 mg Oral Q6H  . amLODipine  10 mg Oral Daily  . atorvastatin  80 mg Oral q1800  . benzonatate  100 mg Oral TID  . bisacodyl  10 mg Oral Daily  . Chlorhexidine Gluconate Cloth  6 each Topical Daily  . dextromethorphan-guaiFENesin  1 tablet Oral BID  . insulin aspart  0-9 Units Subcutaneous TID WC  . mouth rinse  15 mL Mouth Rinse BID  . metoprolol tartrate  25 mg Oral BID  . pantoprazole (PROTONIX) IV  40 mg Intravenous Q24H  . senna-docusate  1 tablet Oral QHS  . sodium chloride flush  10-40 mL Intracatheter Q12H   Continuous Infusions: . aztreonam Stopped (12/02/16 0057)  . bupivacaine ON-Q pain pump    . heparin 850 Units/hr (12/01/16 1900)    . levofloxacin (LEVAQUIN) IV 500 mg (11/30/16 1715)  . potassium chloride     PRN Meds:.fentaNYL, HYDROcodone-acetaminophen, lactulose, levalbuterol, ondansetron (ZOFRAN) IV, potassium chloride, sodium chloride flush  General appearance: alert, cooperative and no distress Heart: irregularly irregular rhythm Lungs: diminished breath sounds bibasilar Abdomen: soft, non-tender; bowel sounds normal; no masses,  no organomegaly Extremities: edema trace Wound: clean and dry  Lab Results: CBC: Recent Labs    12/01/16 0402 12/02/16 0355  WBC 11.6* 12.8*  HGB 7.9* 8.0*  HCT 22.9* 23.0*  PLT 239 257   BMET:  Recent Labs    12/01/16 0402 12/02/16 0355  NA 131* 133*  K 4.4 4.4  CL 98* 100*  CO2 19* 20*  GLUCOSE 163* 143*  BUN 154* 165*  CREATININE 7.35* 6.71*  CALCIUM 7.6* 7.9*    CMET: Lab Results  Component Value Date   WBC 12.8 (H) 12/02/2016   HGB 8.0 (L) 12/02/2016   HCT 23.0 (L) 12/02/2016   PLT 257 12/02/2016   GLUCOSE 143 (H) 12/02/2016   CHOL 169 09/04/2015   TRIG 188 (H) 09/04/2015   HDL 32 (L) 09/04/2015   LDLCALC 99 09/04/2015   ALT 32 12/01/2016   AST 42 (H) 12/01/2016  NA 133 (L) 12/02/2016   K 4.4 12/02/2016   CL 100 (L) 12/02/2016   CREATININE 6.71 (H) 12/02/2016   BUN 165 (H) 12/02/2016   CO2 20 (L) 12/02/2016   TSH 3.397 09/04/2015   INR 1.33 11/28/2016   HGBA1C 6.0 07/06/2016      PT/INR: No results for input(s): LABPROT, INR in the last 72 hours. Radiology: Dg Chest Port 1 View  Result Date: 12/02/2016 CLINICAL DATA:  Chest tube, empyema and shortness-of-breath. EXAM: PORTABLE CHEST 1 VIEW COMPARISON:  12/01/2016 FINDINGS: Sternotomy wires unchanged. Left IJ central venous catheter unchanged with tip obliquely warranted over the SVC. Left basilar chest tube unchanged. Second catheter over the left thorax with tip towards the left apex unchanged. Moderate opacification of the left lung with slight interval worsening. Right lung is clear.  Stable cardiomegaly. Remainder the exam is unchanged. IMPRESSION: Interval worsening opacification of the left lung compatible with known pleural fluid collection/empyema and associated atelectasis. Stable cardiomegaly. Tubes and lines as described. Electronically Signed   By: Marin Olp M.D.   On: 12/02/2016 07:26     Assessment/Plan: S/P Procedure(s) (LRB): VIDEO ASSISTED THORACOSCOPY (VATS)/EMPYEMA (Left)  1. Chest tube- no air leak, output is decreasing- will leave blake drain in place, d/c anterior chest tube today 2. Pulm- off oxygen, CXR with mild worsening of opacification of left lung due to underlying effusion/empyema 3. Empyema- remains afebrile, white count has normalized, OR cultures remain negative to date 4. Acute on Chronic CKD- creatinine down to 6.71, recs per nephrology 5. CV- Chronic A. Fib, BP okay, continue heparin for now, will restart Eliquis once chest tubes are out 6. Dispo- patient stable, d/c anterior chest tube today, care per primary     BARRETT, ERIN 12/02/2016 9:47 AM    Chest tube drainage has been minimal. Remove one chest tube today When last tube is removed patient can be transitioned from heparin to Eliquis for her chronic atrial fibrillation Continue antibiotics  patient examined and medical record reviewed,agree with above note. Tharon Aquas Trigt III 12/02/2016

## 2016-12-02 NOTE — Progress Notes (Signed)
Physical Therapy Treatment Patient Details Name: Christine Santos MRN: [id number] DOB: [birth year] Today's Date: 12/02/2016    History of Present Illness Pt adm with PNA and lt empyema. Pt to underwent VATS 11/6 PM. PMH - CLL, CHF, CKD Stage V, Afib, CVA, CAD    PT Comments    Pt remains motivated to return to independence. Pt could benefit from CIR to return home at higher functional level and decr burden of care on significant other who is visually impaired.   Follow Up Recommendations  CIR     Equipment Recommendations  None recommended by PT    Recommendations for Other Services       Precautions / Restrictions Precautions Precautions: Fall Restrictions Weight Bearing Restrictions: No    Mobility  Bed Mobility Overal bed mobility: Needs Assistance Bed Mobility: Sit to Supine       Sit to supine: +2 for physical assistance;Mod assist   General bed mobility comments: Assist to lower trunk and to bring feet up into bed  Transfers Overall transfer level: Needs assistance Equipment used: Rolling walker (2 wheeled) Transfers: Sit to/from Stand Sit to Stand: Min assist;+2 safety/equipment         General transfer comment: Assist to bring hips up and for balance. Verbal cues for hand placement  Ambulation/Gait Ambulation/Gait assistance: Min assist Ambulation Distance (Feet): 45 Feet(45' x1, 20' x 1) Assistive device: Rolling walker (2 wheeled) Gait Pattern/deviations: Step-through pattern;Drifts right/left;Decreased step length - right;Decreased step length - left Gait velocity: decr Gait velocity interpretation: Below normal speed for age/gender General Gait Details: Assist for balance and support. Pt initially on 4L of O2 but SpO2 to 89%. O2 incr to 6L and pt able to maintain >90%. Pt with sitting rest break of 3-4 minutes between two walks   Stairs            Wheelchair Mobility    Modified Rankin (Stroke Patients Only)       Balance Overall  balance assessment: Needs assistance Sitting-balance support: No upper extremity supported;Feet supported Sitting balance-Leahy Scale: Good     Standing balance support: Single extremity supported Standing balance-Leahy Scale: Poor Standing balance comment: walker and min guard for static standing.                            Cognition Arousal/Alertness: Awake/alert Behavior During Therapy: WFL for tasks assessed/performed Overall Cognitive Status: Within Functional Limits for tasks assessed                                        Exercises      General Comments        Pertinent Vitals/Pain Pain Assessment: Faces Faces Pain Scale: Hurts little more Pain Location: lt chest Pain Descriptors / Indicators: Grimacing Pain Intervention(s): Limited activity within patient's tolerance;Monitored during session;Repositioned    Home Living                      Prior Function            PT Goals (current goals can now be found in the care plan section) Progress towards PT goals: Progressing toward goals    Frequency    Min 3X/week      PT Plan Discharge plan needs to be updated    Co-evaluation  AM-PAC PT "6 Clicks" Daily Activity  Outcome Measure  Difficulty turning over in bed (including adjusting bedclothes, sheets and blankets)?: Unable Difficulty moving from lying on back to sitting on the side of the bed? : Unable Difficulty sitting down on and standing up from a chair with arms (e.g., wheelchair, bedside commode, etc,.)?: Unable Help needed moving to and from a bed to chair (including a wheelchair)?: A Little Help needed walking in hospital room?: A Little Help needed climbing 3-5 steps with a railing? : A Lot 6 Click Score: 11    End of Session Equipment Utilized During Treatment: Gait belt;Oxygen Activity Tolerance: Patient limited by fatigue Patient left: with call bell/phone within reach;with  family/visitor present;in bed Nurse Communication: Mobility status PT Visit Diagnosis: Unsteadiness on feet (R26.81);Muscle weakness (generalized) (M62.81)     Time: 6440-3474 PT Time Calculation (min) (ACUTE ONLY): 31 min  Charges:  $Gait Training: 23-37 mins                    G Codes:       [facility] PT [place] 12/02/2016, 2:43 PM

## 2016-12-03 ENCOUNTER — Inpatient Hospital Stay (HOSPITAL_COMMUNITY): Payer: Medicare Other

## 2016-12-03 LAB — GLUCOSE, CAPILLARY
GLUCOSE-CAPILLARY: 231 mg/dL — AB (ref 65–99)
GLUCOSE-CAPILLARY: 214 mg/dL — AB (ref 65–99)
GLUCOSE-CAPILLARY: 150 mg/dL — AB (ref 65–99)
Glucose-Capillary: 170 mg/dL — ABNORMAL HIGH (ref 65–99)

## 2016-12-03 LAB — BODY FLUID CULTURE: Culture: NO GROWTH

## 2016-12-03 LAB — BASIC METABOLIC PANEL
Anion gap: 12 (ref 5–15)
BUN: 170 mg/dL — AB (ref 6–20)
CO2: 20 mmol/L — AB (ref 22–32)
Calcium: 8.2 mg/dL — ABNORMAL LOW (ref 8.9–10.3)
Chloride: 103 mmol/L (ref 101–111)
Creatinine, Ser: 6.14 mg/dL — ABNORMAL HIGH (ref 0.44–1.00)
GFR calc non Af Amer: 6 mL/min — ABNORMAL LOW (ref >60)
GFR, EST AFRICAN AMERICAN: 7 mL/min — AB (ref >60)
Glucose, Bld: 220 mg/dL — ABNORMAL HIGH (ref 65–99)
POTASSIUM: 4.3 mmol/L (ref 3.5–5.1)
SODIUM: 135 mmol/L (ref 135–145)

## 2016-12-03 LAB — CBC
HEMATOCRIT: 23.2 % — AB (ref 36.0–46.0)
HEMOGLOBIN: 8.1 g/dL — AB (ref 12.0–15.0)
MCH: 31.3 pg (ref 26.0–34.0)
MCHC: 34.9 g/dL (ref 30.0–36.0)
MCV: 89.6 fL (ref 78.0–100.0)
Platelets: 274 10*3/uL (ref 150–400)
RBC: 2.59 MIL/uL — AB (ref 3.87–5.11)
RDW: 14.8 % (ref 11.5–15.5)
WBC: 12.7 10*3/uL — ABNORMAL HIGH (ref 4.0–10.5)

## 2016-12-03 LAB — HEPARIN LEVEL (UNFRACTIONATED): Heparin Unfractionated: 0.36 IU/mL (ref 0.30–0.70)

## 2016-12-03 MED ORDER — PANTOPRAZOLE SODIUM 40 MG PO TBEC
40.0000 mg | DELAYED_RELEASE_TABLET | Freq: Every day | ORAL | Status: DC
  Administered 2016-12-04 – 2016-12-07 (×4): 40 mg via ORAL
  Filled 2016-12-03 (×4): qty 1

## 2016-12-03 NOTE — Progress Notes (Signed)
Pharmacy Antibiotic Note  Christine Santos is a 71 y.o. female admitted on 11/25/2016 with acute respiratory failure, tx'd as CAP.Marland Kitchen  Pharmacy has been consulted for Levaquin and Azactam dosing.  Pt with acute on chronic kidney disease (baseline SCr ~3, now 6.9).  Afebrile, wbc stable at 12.8.  Azactam d/c'd by CCM yesterday.  Cultures remain negative.  Plan: Levaquin 500mg  IV Q48H. Follow up culture data  Height: 5' 5.5" (166.4 cm) Weight: 184 lb 1.4 oz (83.5 kg) IBW/kg (Calculated) : 58.15  Temp (24hrs), Avg:98.1 F (36.7 C), Min:97.7 F (36.5 C), Max:98.4 F (36.9 C)  Recent Labs  Lab 11/26/16 1524 11/26/16 1820  11/28/16 0427  11/29/16 0230 11/30/16 0423 12/01/16 0402 12/02/16 0355 12/03/16 0407  WBC  --   --    < > 17.6*   < > 15.5* 16.6* 11.6* 12.8* 12.7*  CREATININE  --   --    < > 6.20*   < > 6.54* 6.92* 7.35* 6.71* 6.14*  LATICACIDVEN 1.0 1.1  --  0.8  --   --   --   --   --   --    < > = values in this interval not displayed.    Estimated Creatinine Clearance: 9.1 mL/min (A) (by C-G formula based on SCr of 6.14 mg/dL (H)).    Allergies  Allergen Reactions  . Other     Darvocet  . Penicillins   . Amoxicillin Rash    Has patient had a PCN reaction causing immediate rash, facial/tongue/throat swelling, SOB or lightheadedness with hypotension: No Has patient had a PCN reaction causing severe rash involving mucus membranes or skin necrosis: No Has patient had a PCN reaction that required hospitalization: No Has patient had a PCN reaction occurring within the last 10 years: No If all of the above answers are "NO", then may proceed with Cephalosporin use.   . Cephalexin Rash  . Codeine Phosphate Rash   Antimicrobials this admission:  Levaquin 11/2>>11/9 Aztreonam 11/5>>  Dose adjustments this admission:   Microbiology results:  11/3 bld >neg Urine strep ag + 11/5 body fluid L pleural - ngtd 11/6 left pleural peel/tissue - ngtd 11/8  - UCx neg  Thank you  for allowing pharmacy to be a part of this patient's care.  Uvaldo Rising, BCPS  Clinical Pharmacist Pager [phone]  12/03/2016 7:38 AM

## 2016-12-03 NOTE — Progress Notes (Signed)
Family Medicine Teaching Service Daily Progress Note Intern Pager: [phone]  Patient name: Christine Santos Medical record number: [id number] Date of birth: 1945/03/18 Age: 71 y.o. Gender: female  Primary Care Provider: [name], MD Consultants: Pulmonology  Code Status: Full   Pt Overview and Major Events to Date:  11/2: Patient admitted for acute hypoxic respiratory failure, treatment started for PNA  11/3: Pulmonology consulted when CT scan chest showed substantial consolidation of left lung with a moderate to large left pleural effusion  11/5 Thoracentesis 11/6 VATS with chest tube placement 11/7 extubated, off pressors   Assessment and Plan: Christine Santos is a 71 y.o. female presenting with cough and dyspnea. PMH is significant for T2DM with peripheral neuropathy, CLL s/p chemotherapy, HTN, CKDVI, CAD s/p CABG, hx CVA in 2017, hx of afib on eliquis.   Pneumonia: Pt is alert. Pt feels breathing is the same as yesterday, but overall feels like she has less energy. CXR shows new patchy consolidation on right medial base. S/p VATS 11/6. Blood cultures NGTD. Wound cultures NGTD. Acid fast smears negative. No fungal growth to date. WBC Stable at 12.7 . Afebrile. Anterior chest tube pulled yesterday. blake drain remains. Black drain put out 40cc. Currently on Heparin for anticoagulation, may restart eliquis once chest tube removed. Sating well on 4L O2 by [place]. Pt pain well controlled on oral pain meds. Pulm signed off. Given slightly worsening clinical condition and new finding on xray, no change in antibiotics - Levaquin 750 mg (11/2- ), azactam (11/5 - )  - xopenox for elevated RR, tessalon perles prn - supplemental O2 to maintain O2 sats >/= 92%  - trend CBC  - TCTS recommendations appreciated - tylenol scheduled - norco 5-325, 1-2 q6h prn   AKI on CKD stage 4. Cr continues to improve to 6.71 > 6.14. Baseline Cr ~3.5. AKI. Gentle fluid hydration initiated per Nephrology. UOP  mL  past 24 hrs - appreciate Nephrology recommendations  - monitor CBC and BMP - avoid nephrotoxic medications - holding torsemide   Anemia Likely ABLA on Chronic anemia. Stable at 8.1. Transfusion threshold 8.0. Hgb 10.0 on admit. Hg baseline ~10.  - Follow Hg - hold iron due to PNA  H/o diastolic CHF w/ h/o CAD s/p CABG 1996. Weights. Hemodynamically stable. Metoprolol 25 mg BID. HR stable <110 last 24hrs. At home taking torsemide daily, alternating doses of 50 mg and 100 mg; metoprolol tartrate 12.5 mg BID; norvasc 10 mg daily; atorvastatin 80 mg daily. Last echo 2017 showed EF of 55-60% with G1DD. Troponin trend: 0.83>0.84>0.67.  - hold torsemide due to AKI.  - Continue metoprolol, norvasc, atorvastatin - I/Os - Daily weights   Afib:  HR < 110. Rate control with metoprolol 25 mg BID. Holding eliquis, and instead on Heparin. Can restart elequis, when chest tube removed.  - telemitry  T2DM CBG 138-263 past 24 hrs. Last a1c 6.0 on 07/06/16, at home on lantus 37U daily with 5U humalog tid with meals. Pt is eating more since surgery. Currently on  - monitor CBGs qACHS - SSI, lantus 15 U  HTN: Normotensive. Hypertensive this morning, may can restart home meds tomorrow. At home on norvasc, metoprolol, torsemide. - continue metoprolol and norvasc - hold torsemide  HLD at home on fenofibrate and atorvastatin.last lipid panel 09/04/15. LDL 99. Triglycerides 188. - continue home meds  GERDat home on protonix - continue home med  Goutat home on allopurinol  - continue home med  Anxiety at home on  paxil 20 mg daily - stopping due to urinary retention   PT/OT - PT recommended CIR  FEN/GI: Carb modified.  Prophylaxis: Heparin  Disposition: Inpatient pending respiratory improvement  Subjective:  Patient denies SOB. Says she feels more tired today. "Her get up and go, had done gone up and get".    Objective: Temp:  [97.7 F (36.5 C)-98.4 F (36.9 C)] 98.4 F (36.9 C)  (11/10 0401) Pulse Rate:  [76-101] 84 (11/10 0600) Resp:  [11-21] 12 (11/10 0600) BP: (110-147)/(49-84) 126/49 (11/10 0600) SpO2:  [94 %-100 %] 99 % (11/10 0600) Weight:  [184 lb 1.4 oz (83.5 kg)] 184 lb 1.4 oz (83.5 kg) (11/10 0400)  Physical Exam: General: NAD, appears tired Cardiovascular: regular rate, regular rhythm. No murmur appreciated.  Respiratory: CTA bilaterally, Slight decreased breath sounds on Left lower field, No increased WOB on 4L O2 [place].  Abdomen: soft, NTND   Laboratory: Recent Labs  Lab 12/01/16 0402 12/02/16 0355 12/03/16 0407  WBC 11.6* 12.8* 12.7*  HGB 7.9* 8.0* 8.1*  HCT 22.9* 23.0* 23.2*  PLT 239 257 274   Recent Labs  Lab 11/28/16 1032 11/28/16 2108  12/01/16 0402 12/02/16 0355 12/03/16 0407  NA  --  132*   < > 131* 133* 135  K  --  4.2   < > 4.4 4.4 4.3  CL  --  93*   < > 98* 100* 103  CO2  --  21*   < > 19* 20* 20*  BUN  --  130*   < > 154* 165* 170*  CREATININE  --  6.60*   < > 7.35* 6.71* 6.14*  CALCIUM  --  8.3*   < > 7.6* 7.9* 8.2*  PROT 5.9* 5.7*  --  4.7*  --   --   BILITOT  --  0.9  --  0.8  --   --   ALKPHOS  --  71  --  81  --   --   ALT  --  37  --  32  --   --   AST  --  52*  --  42*  --   --   GLUCOSE  --  200*   < > 163* 143* 220*   < > = values in this interval not displayed.     Imaging/Diagnostic Tests: Dg Chest 2 View  Result Date: 11/25/2016 CLINICAL DATA:  71 y/o  F; chest pain with cough and hemoptysis. EXAM: CHEST  2 VIEW COMPARISON:  04/07/2014 PET-CT.  01/10/2009 chest radiograph. FINDINGS: Mild cardiomegaly. Aortic atherosclerosis with calcification. Status post CABG. Median sternotomy wires are aligned. Extensive consolidations throughout the left mid and lower lung zones. Small to moderate left effusion. Right upper quadrant cholecystectomy clips. Left humerus rotator cuff repair anchor. Multilevel degenerative changes of the spine. IMPRESSION: Extensive consolidation throughout left lung and small to moderate  left effusion probably representing pneumonia. Follow-up to resolution is recommended to exclude underlying malignancy. Electronically Signed   By: Kristine Garbe M.D.   On: 11/25/2016 15:23   Ct Chest Wo Contrast  Result Date: 11/26/2016 CLINICAL DATA:  Patient with respiratory failure. EXAM: CT CHEST WITHOUT CONTRAST TECHNIQUE: Multidetector CT imaging of the chest was performed following the standard protocol without IV contrast. COMPARISON:  Chest radiograph 11/25/2016. FINDINGS: Cardiovascular: Heart is enlarged. No pericardial effusion. Coronary arterial vascular calcifications. Thoracic aortic vascular calcifications. Stable fluid density structure along the anterior right aspect of the mediastinum (image 75; series 3). Mediastinum/Nodes: Multiple  prominent subcentimeter mediastinal lymph nodes are visualized. The esophagus is unremarkable. Abarrent right subclavian vein. Lungs/Pleura: Bowing of the posterior trachea secondary to expiratory phase imaging. There is a a moderate complex left pleural effusion which appears partially loculated. There is extensive consolidation throughout the left lung. Minimal aerated left upper lobe. Patchy ground-glass and consolidative opacities demonstrated throughout the right upper, right middle and right lower lobes. Trace right pleural fluid. No pneumothorax. Upper Abdomen: Status post cholecystectomy.  No acute process. Musculoskeletal: Thoracic spine degenerative changes. No aggressive or acute appearing osseous lesions. IMPRESSION: 1. There is extensive consolidation throughout the left lung which may be secondary to pneumonia in the appropriate clinical setting. There is a complex appearing loculated moderate left pleural effusion. Recommend radiographic followup to ensure resolution. 2. Patchy ground-glass and consolidative opacities scattered throughout the right lung which may be secondary to an infectious or inflammatory process. Recommend attention  on follow-up. 3. Multiple prominent subcentimeter superior mediastinal lymph nodes are nonspecific however likely reactive in etiology. Recommend attention on follow-up. 4. Cardiomegaly.  Coronary arterial atherosclerosis. 5. Aortic Atherosclerosis (ICD10-I70.0). Electronically Signed   By: Lovey Newcomer M.D.   On: 11/26/2016 14:52   Dg Chest Port 1 View  Result Date: 12/02/2016 CLINICAL DATA:  Chest tube, empyema and shortness-of-breath. EXAM: PORTABLE CHEST 1 VIEW COMPARISON:  12/01/2016 FINDINGS: Sternotomy wires unchanged. Left IJ central venous catheter unchanged with tip obliquely warranted over the SVC. Left basilar chest tube unchanged. Second catheter over the left thorax with tip towards the left apex unchanged. Moderate opacification of the left lung with slight interval worsening. Right lung is clear. Stable cardiomegaly. Remainder the exam is unchanged. IMPRESSION: Interval worsening opacification of the left lung compatible with known pleural fluid collection/empyema and associated atelectasis. Stable cardiomegaly. Tubes and lines as described. Electronically Signed   By: Marin Olp M.D.   On: 12/02/2016 07:26   Dg Chest Port 1 View  Result Date: 12/01/2016 CLINICAL DATA:  Status post VATS for left empyema. EXAM: PORTABLE CHEST 1 VIEW COMPARISON:  11/30/2016 FINDINGS: Two left thoracostomy tubes and left IJ central venous catheter with tip overlying the mid-upper SVC again noted. An endotracheal tube is been removed. Left pleural and parenchymal opacities are unchanged. There is no evidence of pneumothorax. Right basilar atelectasis is again noted. Cardiomegaly and CABG changes again identified. IMPRESSION: Endotracheal tube removal without other significant change. Support apparatus as described. No evidence of pneumothorax. Electronically Signed   By: Margarette Canada M.D.   On: 12/01/2016 08:47   Dg Chest Port 1 View  Result Date: 11/30/2016 CLINICAL DATA:  Hypoxia EXAM: PORTABLE CHEST 1  VIEW COMPARISON:  November 29, 2016 FINDINGS: Endotracheal tube tip is 2.3 cm above the carina. Central catheter tip is at the junction of the left innominate vein and superior vena cava. There are chest tubes on the left, unchanged in position. No pneumothorax. Areas of consolidation throughout much of the left lung remain. No new opacity evident. No cavitation noted on the left. Right lung appears clear. There is cardiomegaly with pulmonary vascularity within normal limits. No adenopathy. Patient is status post coronary artery bypass grafting. There is postoperative change in the left shoulder. IMPRESSION: Tube and catheter positions without evident pneumothorax. Extensive airspace consolidation on the left is stable. No new opacity on either side. Right lungs clear. Stable cardiac enlargement. Electronically Signed   By: Lowella Grip III M.D.   On: 11/30/2016 08:50   Dg Chest Port 1 View  Result Date: 11/29/2016 CLINICAL  DATA:  Empyema of the lung.  Surgery follow-up. EXAM: PORTABLE CHEST 1 VIEW COMPARISON:  11/29/2016 FINDINGS: An endotracheal tube has been placed with tip measuring 1.9 cm above the carina. A left central venous catheter was placed with tip directed somewhat horizontally in the mid right mediastinum, likely representing location of the confluence of the brachiocephalic and superior vena cava veins. 2 left chest tubes. Postoperative changes in the mediastinum. Shallow inspiration. Cardiac enlargement without vascular congestion. Consolidation in the left mid and lower lung zones is improved since previous study. Probable evacuation of pleural fluid collection. No visible pneumothorax. Surgical clips in the right upper quadrant. Loop recorder. Degenerative changes in the spine and shoulders. IMPRESSION: Appliances appear in satisfactory position. Shallow inspiration. Cardiac enlargement. Improved left lung consolidation. Interval evacuation of left pleural fluid collection. Electronically  Signed   By: Lucienne Capers M.D.   On: 11/29/2016 22:42   Dg Chest Port 1 View  Result Date: 11/29/2016 CLINICAL DATA:  Shortness of breath EXAM: PORTABLE CHEST 1 VIEW COMPARISON:  Portable chest x-ray of 11/28/2016, and CT chest of 11/26/2016 FINDINGS: There is little change in opacity at the left lung base consistent with atelectasis, pneumonia, and apparently loculated left pleural effusion. The right lung is grossly clear by chest x-ray. Mild cardiomegaly is stable. Median sternotomy sutures are noted from prior CABG. No acute bony abnormality is seen. The loop recorder is no longer seen. IMPRESSION: 1. Persistent opacity at the left lung base consistent with atelectasis, pneumonia, in apparently loculated left pleural effusion. 2. Stable cardiomegaly Electronically Signed   By: Ivar Drape M.D.   On: 11/29/2016 10:33   Dg Chest Port 1 View  Result Date: 11/28/2016 CLINICAL DATA:  71 year old female status post left side thoracentesis earlier this hour. EXAM: PORTABLE CHEST 1 VIEW COMPARISON:  0545 hours today and earlier. FINDINGS: Portable AP upright view at 1032 hours. No left pneumothorax. Continued subtotal opacification of the left hemithorax, ventilation not significantly changed. Visible mediastinal contours are stable. Cardiomegaly. Prior CABG. Left chest cardiac event recorder. Right lung appears stable and clear when allowing for portable technique. Negative visible bowel gas pattern. IMPRESSION: No pneumothorax or adverse features status post left side thoracentesis. Electronically Signed   By: Genevie Ann M.D.   On: 11/28/2016 10:53   Dg Chest Port 1 View  Result Date: 11/28/2016 CLINICAL DATA:  Shortness of breath.  Pneumonia EXAM: PORTABLE CHEST 1 VIEW COMPARISON:  Chest CT 2 days ago. FINDINGS: Extensive opacification of the left lung which is a combination of loculated pleural fluid and atelectasis/ consolidation. Cardiomegaly. Status post CABG. There is an implantable loop recorder.  The right lung remains clear. IMPRESSION: 1. History of pneumonia with unchanged extensive left lung consolidation and complex pleural effusion. 2. Cardiomegaly. Electronically Signed   By: Monte Fantasia M.D.   On: 11/28/2016 08:04    Bonnita Hollow, MD 12/03/2016, 6:45 AM PGY-1, [facility] Intern pager: [phone], text pages welcome

## 2016-12-03 NOTE — Progress Notes (Addendum)
Manuela Schwartz and Vickie,PT called Winfred Burn urgently to the bedside at 1530.  Patient's primary nurse notified also. Upon arrival to room patient was observed to be on the floor s/p assisted fall to the floor by PT staff during walk.  Patient w/o injury after assessment.  Transported back to bed via [employer].  Family at bedside and MD notified.

## 2016-12-03 NOTE — Progress Notes (Signed)
[place] for heparin  Indication: Atrial fibrillation  Allergies  Allergen Reactions  . Other     Darvocet  . Penicillins   . Amoxicillin Rash    Has patient had a PCN reaction causing immediate rash, facial/tongue/throat swelling, SOB or lightheadedness with hypotension: No Has patient had a PCN reaction causing severe rash involving mucus membranes or skin necrosis: No Has patient had a PCN reaction that required hospitalization: No Has patient had a PCN reaction occurring within the last 10 years: No If all of the above answers are "NO", then may proceed with Cephalosporin use.   . Cephalexin Rash  . Codeine Phosphate Rash    Patient Measurements: Height: 5' 5.5" (166.4 cm) Weight: 184 lb 1.4 oz (83.5 kg) IBW/kg (Calculated) : 58.15 Heparin Dosing Weight: 86.1 kg  Vital Signs: Temp: 98.4 F (36.9 C) (11/10 0401) Temp Source: Oral (11/10 0401) BP: 147/73 (11/10 0700) Pulse Rate: 91 (11/10 0700)  Labs: Recent Labs    11/30/16 1655  12/01/16 0402 12/01/16 0403 12/02/16 0355 12/02/16 0408 12/03/16 0407  HGB  --    < > 7.9*  --  8.0*  --  8.1*  HCT  --   --  22.9*  --  23.0*  --  23.2*  PLT  --   --  239  --  257  --  274  APTT 63*  --  72*  --   --   --   --   HEPARINUNFRC 0.59  --   --  0.39  --  0.42 0.36  CREATININE  --   --  7.35*  --  6.71*  --  6.14*   < > = values in this interval not displayed.    Estimated Creatinine Clearance: 9.1 mL/min (A) (by C-G formula based on SCr of 6.14 mg/dL (H)).   Medical History: Past Medical History:  Diagnosis Date  . Anemia 08/29/2011  . Anxiety   . Arthritis    "legs" (01/22/2013)  . CAD (coronary artery disease)    s/p CABG in 1999  . Cataract   . Chronic kidney disease   . CLL (chronic lymphoblastic leukemia)   . Diastolic heart failure ([place])   . GERD (gastroesophageal reflux disease)   . H/O hiatal hernia   . Heart murmur   . Hyperlipidemia   . Hypertension   .  IDDM (insulin dependent diabetes mellitus) ([place])   . PAD (peripheral artery disease) ([place])   . Paroxysmal atrial fibrillation ([facility])    a. identified on ILR as part of StrokeAF study  . Peripheral vascular disease ([place])   . Stroke (cerebrum) [facility])    Assessment: 71 yo female admitted on 11/2 with respiratory failure 2/2 PNA. Thoracentesis 11/5 and VATS procedure on 11/6.   Patient's afib is rate controlled with metoprolol>>currently on hold. Holding PTA Eliquis.  Last apix dose 11/3 @0915 . Heparin level (0.36) is within goal range this AM, on 850 units/hr. Hgb low but stable ~ 8. Platelets remain within normal limits. No signs/symptoms of bleeding. No issues with infusion per nursing.  Per discussion with surgery, will not titrate heparin infusion over 1000 units/hr.  Goal of Therapy:  Heparin level 0.3-0.7 units/ml Monitor platelets by anticoagulation protocol: Yes   Plan:  -Continue heparin at 850 units/hr -Monitor daily heparin level and CBC -Transition back to Eliquis once chest tubes all out.  Uvaldo Rising, BCPS  Clinical Pharmacist Pager [phone]  12/03/2016 7:31 AM

## 2016-12-03 NOTE — Progress Notes (Signed)
Subjective: Doing well today, has been ambulating some. Denies current complaints.   Objective: Vital signs in last 24 hours: Temp:  [97.7 F (36.5 C)-98.4 F (36.9 C)] 98.4 F (36.9 C) (11/10 0401) Pulse Rate:  [76-101] 91 (11/10 0700) Resp:  [11-19] 18 (11/10 0700) BP: (110-147)/(49-84) 147/73 (11/10 0700) SpO2:  [94 %-100 %] 96 % (11/10 0700) Weight:  [184 lb 1.4 oz (83.5 kg)] 184 lb 1.4 oz (83.5 kg) (11/10 0400) Weight change:   Intake/Output from previous day: 11/09 0701 - 11/10 0700 In: 544 [P.O.:240; I.V.:204; IV Piggyback:100] Out: 740 [Urine:700; Chest Tube:40] Intake/Output this shift: No intake/output data recorded.  General appearance: alert, cooperative and no distress Resp: diminished breath sounds anterior - bilateral and normal work of breathing Cardio: irregularly irregular rhythm GI: soft, non-tender; bowel sounds normal; no masses,  no organomegaly Extremities: edema trace Skin: Skin color, texture, turgor normal. No rashes or lesions Neurologic: Grossly normal.   Lab Results: Recent Labs    12/02/16 0355 12/03/16 0407  WBC 12.8* 12.7*  HGB 8.0* 8.1*  HCT 23.0* 23.2*  PLT 257 274   BMET:  Recent Labs    12/02/16 0355 12/03/16 0407  NA 133* 135  K 4.4 4.3  CL 100* 103  CO2 20* 20*  GLUCOSE 143* 220*  BUN 165* 170*  CREATININE 6.71* 6.14*  CALCIUM 7.9* 8.2*   No results for input(s): PTH in the last 72 hours. Iron Studies: No results for input(s): IRON, TIBC, TRANSFERRIN, FERRITIN in the last 72 hours.  Studies/Results: Dg Chest Port 1 View  Result Date: 12/03/2016 CLINICAL DATA:  Shortness of breath.  Empyema. EXAM: PORTABLE CHEST 1 VIEW COMPARISON:  December 02, 2016 FINDINGS: More superior chest tube on the left is again noted. More inferior chest tube on the left has been removed. Central catheter tip is at the junction of the left innominate vein and superior vena cava. No pneumothorax. There is consolidation throughout much of the  left lung, stable. There is a small left pleural effusion. On the right, there is new patchy airspace consolidation in the medial base region. There is cardiomegaly with pulmonary vascularity within normal limits. Patient is status post coronary artery bypass grafting. There is aortic atherosclerosis. No adenopathy is demonstrable by radiography. No bone lesions. IMPRESSION: Or inferior chest tube on the left is been removed. No pneumothorax. Extensive airspace consolidation throughout much of the left lung remains. There is new patchy consolidation in the medial right base. There is stable cardiomegaly. There is aortic atherosclerosis. Aortic Atherosclerosis (ICD10-I70.0). Electronically Signed   By: Lowella Grip III M.D.   On: 12/03/2016 07:08   Dg Chest Port 1 View  Result Date: 12/02/2016 CLINICAL DATA:  Chest tube, empyema and shortness-of-breath. EXAM: PORTABLE CHEST 1 VIEW COMPARISON:  12/01/2016 FINDINGS: Sternotomy wires unchanged. Left IJ central venous catheter unchanged with tip obliquely warranted over the SVC. Left basilar chest tube unchanged. Second catheter over the left thorax with tip towards the left apex unchanged. Moderate opacification of the left lung with slight interval worsening. Right lung is clear. Stable cardiomegaly. Remainder the exam is unchanged. IMPRESSION: Interval worsening opacification of the left lung compatible with known pleural fluid collection/empyema and associated atelectasis. Stable cardiomegaly. Tubes and lines as described. Electronically Signed   By: Marin Olp M.D.   On: 12/02/2016 07:26    Scheduled: . acetaminophen  1,000 mg Oral Q6H   Or  . acetaminophen (TYLENOL) oral liquid 160 mg/5 mL  1,000 mg Oral Q6H  .  amLODipine  10 mg Oral Daily  . atorvastatin  80 mg Oral q1800  . benzonatate  100 mg Oral TID  . bisacodyl  10 mg Oral Daily  . Chlorhexidine Gluconate Cloth  6 each Topical Daily  . dextromethorphan-guaiFENesin  1 tablet Oral BID   . insulin aspart  0-9 Units Subcutaneous TID WC  . mouth rinse  15 mL Mouth Rinse BID  . metoprolol tartrate  25 mg Oral BID  . pantoprazole (PROTONIX) IV  40 mg Intravenous Q24H  . senna-docusate  1 tablet Oral QHS  . sodium chloride flush  10-40 mL Intracatheter Q12H    Assessment/Plan: Pt is a 71 y.o. yo female with a PMHX of CKDIV, CLL s/p chemo, T2DM, HTN, CAD s/p CABG, prior CVA in 2017, afib on eliquis, was admitted to [facility] on 11/25/2016 with pneumonia and pleural effusion. She has had worsening kidney function during her stay.    1. Acute on chronic kidney disease- Creatinine has plateaued and is now decreasing. UOP increasing. All positive signs. Baseline Cr appears to be about 3. Will await return of renal function.  -continue to hold torsemide -continue IV fluids -monitor renal function closely -monitor UOP closely  -daily weights, strict I/Os  2. Pneumonia w/ complex loculated left pleural effusion- continue antibiotics, s/p thoracentesis and VATS, pleural fluid w/ no growth  3. Afib w/ RVR- continue metoprolol, hold eliquis 2/2 hemoptysis  4. T2DM- per primary  5. HTN- stable, continue hydralazine and norvasc  6. ?UTI- Foley in, large leuks in UA- send urine for culture. Already on Levo and aztreonam -ucx pending  Dispo: pending clinical improvement, likely SNF     LOS: 8 days   Elbie Statzer C Brady Plant 12/03/2016,7:50 AM

## 2016-12-03 NOTE — Evaluation (Signed)
Physical Therapy Evaluation Patient Details Name: Christine Santos MRN: [id number] DOB: 1945-02-28 Today's Date: 12/03/2016   History of Present Illness  Pt adm with PNA and lt empyema. Pt to underwent VATS 11/6 PM. PMH - CLL, CHF, CKD Stage V, Afib, CVA, CAD  Clinical Impression  During today's PT session, patient required max encouragement to participate, and was self-limiting throughout session.  Behaviors "unlike her" per significant other.  Patient able to ambulate 64' with max encouragement throughout gait training. Patient returned to room and ambulated to side of bed.  Patient abruptly leaned forward onto RW with UE's, and began to sit on floor.  Assisted patient to floor.  RN's to room to assist with transfer floor to bed with [facility] lift equipment.  Patient reports no injury.  RN contacted MD.  Christine Santos that patient will be able to tolerate intensity of CIR (?).  At this point, would recommend SNF at discharge for longer recovery period.  Will continue to follow.    Follow Up Recommendations SNF;Supervision/Assistance - 24 hour    Equipment Recommendations  None recommended by PT    Recommendations for Other Services       Precautions / Restrictions Precautions Precautions: Fall Restrictions Weight Bearing Restrictions: No(Simultaneous filing. User may not have seen previous data.)      Mobility  Bed Mobility Overal bed mobility: Needs Assistance Bed Mobility: Supine to Sit     Supine to sit: Supervision;HOB elevated     General bed mobility comments: Patient required max encouragement by PT and significant other to get OOB to ambulate.    Transfers Overall transfer level: Needs assistance Equipment used: Rolling walker (2 wheeled) Transfers: Sit to/from Stand Sit to Stand: Min assist;+2 safety/equipment         General transfer comment: Verbal cues for hand placement. Assist to steady during transfer to stand.  On return to bed, patient ambulated beside of bed.   She impulsively leaned forward on forearms on RW.  She began to move to sitting on floor. PT assisted to floor.  RN's to room.  Patient moved from sitting on floor to supine in bed by [facility].  Patient reports no injuries.  Ambulation/Gait Ambulation/Gait assistance: Min assist;Mod assist;+2 safety/equipment Ambulation Distance (Feet): 40 Feet Assistive device: Rolling walker (2 wheeled) Gait Pattern/deviations: Step-through pattern;Decreased step length - right;Decreased step length - left;Decreased stride length;Drifts right/left;Trunk flexed;Shuffle Gait velocity: decreased primarily Gait velocity interpretation: Below normal speed for age/gender General Gait Details: Verbal cues to encourage patient to continue, and physical assist for balance/safety.  After ambulating 10', encouraged patient to continue.  Patient impulsively increased her speed dramatically toward target, shouting "I'm doing it".  Verbal cues to slow to more safe speed.  On return to room at bedside, leaned forward on RW with forearms and began sitting.  Cues to stand, turn and sit on bed.  Patient continued to slide toward floor.  See "Transfers" documentation.  Stairs            Wheelchair Mobility    Modified Rankin (Stroke Patients Only)       Balance Overall balance assessment: Needs assistance Sitting-balance support: No upper extremity supported;Feet supported Sitting balance-Leahy Scale: Good     Standing balance support: Single extremity supported Standing balance-Leahy Scale: Poor                               Pertinent Vitals/Pain Pain Assessment: No/denies pain  Home Living                        Prior Function                 Hand Dominance        Extremity/Trunk Assessment                Communication      Cognition Arousal/Alertness: Awake/alert Behavior During Therapy: Agitated;Impulsive Overall Cognitive Status: Within Functional Limits  for tasks assessed                                        General Comments      Exercises     Assessment/Plan    PT Assessment    PT Problem List         PT Treatment Interventions      PT Goals (Current goals can be found in the Care Plan section)       Frequency Min 3X/week   Barriers to discharge        Co-evaluation               AM-PAC PT "6 Clicks" Daily Activity  Outcome Measure Difficulty turning over in bed (including adjusting bedclothes, sheets and blankets)?: None Difficulty moving from lying on back to sitting on the side of the bed? : A Lot Difficulty sitting down on and standing up from a chair with arms (e.g., wheelchair, bedside commode, etc,.)?: Unable Help needed moving to and from a bed to chair (including a wheelchair)?: A Little Help needed walking in hospital room?: A Lot Help needed climbing 3-5 steps with a railing? : A Lot 6 Click Score: 14    End of Session Equipment Utilized During Treatment: Gait belt;Oxygen Activity Tolerance: Patient limited by fatigue(Self-limited.) Patient left: in bed;with call bell/phone within reach;with bed alarm set;with nursing/sitter in room;with family/visitor present Nurse Communication: Mobility status;Need for lift equipment;Other (comment)(Assisted fall) PT Visit Diagnosis: Unsteadiness on feet (R26.81);Muscle weakness (generalized) (M62.81);Other abnormalities of gait and mobility (R26.89)    Time: 8003-4917 PT Time Calculation (min) (ACUTE ONLY): 30 min   Charges:     PT Treatments $Therapeutic Activity: 23-37 mins   PT G Codes:        Christine Pian. Christine Santos, [facility] Acute Rehab Services Pager [phone]   Christine Santos 12/03/2016, 8:19 PM

## 2016-12-03 NOTE — Progress Notes (Signed)
4 Days Post-Op Procedure(s) (LRB): VIDEO ASSISTED THORACOSCOPY (VATS)/EMPYEMA (Left) Subjective: C/o pain  Objective: Vital signs in last 24 hours: Temp:  [97.7 F (36.5 C)-98.4 F (36.9 C)] 97.9 F (36.6 C) (11/10 0815) Pulse Rate:  [74-101] 74 (11/10 0815) Cardiac Rhythm: Atrial fibrillation (11/10 0800) Resp:  [11-19] 18 (11/10 0815) BP: (110-147)/(49-84) 139/63 (11/10 0815) SpO2:  [94 %-100 %] 96 % (11/10 0815) Weight:  [184 lb 1.4 oz (83.5 kg)] 184 lb 1.4 oz (83.5 kg) (11/10 0400)  Hemodynamic parameters for last 24 hours: CVP:  [5 mmHg-17 mmHg] 10 mmHg  Intake/Output from previous day: 11/09 0701 - 11/10 0700 In: 544 [P.O.:240; I.V.:204; IV Piggyback:100] Out: 740 [Urine:700; Chest Tube:40] Intake/Output this shift: Total I/O In: 128.5 [P.O.:120; I.V.:8.5] Out: 300 [Urine:300]  General appearance: alert and cooperative Heart: regular rate and rhythm Lungs: diminished breath sounds bibasilar no air leak  Lab Results: Recent Labs    12/02/16 0355 12/03/16 0407  WBC 12.8* 12.7*  HGB 8.0* 8.1*  HCT 23.0* 23.2*  PLT 257 274   BMET:  Recent Labs    12/02/16 0355 12/03/16 0407  NA 133* 135  K 4.4 4.3  CL 100* 103  CO2 20* 20*  GLUCOSE 143* 220*  BUN 165* 170*  CREATININE 6.71* 6.14*  CALCIUM 7.9* 8.2*    PT/INR: No results for input(s): LABPROT, INR in the last 72 hours. ABG    Component Value Date/Time   PHART 7.393 11/28/2016 2338   HCO3 21.7 11/28/2016 2338   TCO2 23 11/28/2016 2338   ACIDBASEDEF 3.0 (H) 11/28/2016 2338   O2SAT 92.0 11/28/2016 2338   CBG (last 3)  Recent Labs    12/02/16 1613 12/02/16 2124 12/03/16 0816  GLUCAP 263* 209* 231*    Assessment/Plan: S/P Procedure(s) (LRB): VIDEO ASSISTED THORACOSCOPY (VATS)/EMPYEMA (Left) -  No air leak and minimal drainage from CT. CXR shows consolidation of left lung, possibly some loculated effusion Will dc chest tube  Cough effort weak- continue flutter valve  Mobilize as  tolerated- takes 2 person assist  Creatinine down slightly   LOS: 8 days    Christine Santos 12/03/2016

## 2016-12-04 ENCOUNTER — Inpatient Hospital Stay (HOSPITAL_COMMUNITY): Payer: Medicare Other

## 2016-12-04 DIAGNOSIS — L8990 Pressure ulcer of unspecified site, unspecified stage: Secondary | ICD-10-CM

## 2016-12-04 LAB — BASIC METABOLIC PANEL
ANION GAP: 13 (ref 5–15)
BUN: 170 mg/dL — ABNORMAL HIGH (ref 6–20)
CALCIUM: 8.4 mg/dL — AB (ref 8.9–10.3)
CO2: 19 mmol/L — ABNORMAL LOW (ref 22–32)
Chloride: 105 mmol/L (ref 101–111)
Creatinine, Ser: 5.53 mg/dL — ABNORMAL HIGH (ref 0.44–1.00)
GFR, EST AFRICAN AMERICAN: 8 mL/min — AB (ref >60)
GFR, EST NON AFRICAN AMERICAN: 7 mL/min — AB (ref >60)
GLUCOSE: 160 mg/dL — AB (ref 65–99)
POTASSIUM: 4.5 mmol/L (ref 3.5–5.1)
SODIUM: 137 mmol/L (ref 135–145)

## 2016-12-04 LAB — CBC
HCT: 24.5 % — ABNORMAL LOW (ref 36.0–46.0)
Hemoglobin: 8.3 g/dL — ABNORMAL LOW (ref 12.0–15.0)
MCH: 30.6 pg (ref 26.0–34.0)
MCHC: 33.9 g/dL (ref 30.0–36.0)
MCV: 90.4 fL (ref 78.0–100.0)
PLATELETS: 275 10*3/uL (ref 150–400)
RBC: 2.71 MIL/uL — AB (ref 3.87–5.11)
RDW: 15.1 % (ref 11.5–15.5)
WBC: 14.2 10*3/uL — AB (ref 4.0–10.5)

## 2016-12-04 LAB — GLUCOSE, CAPILLARY
GLUCOSE-CAPILLARY: 161 mg/dL — AB (ref 65–99)
GLUCOSE-CAPILLARY: 203 mg/dL — AB (ref 65–99)
Glucose-Capillary: 153 mg/dL — ABNORMAL HIGH (ref 65–99)
Glucose-Capillary: 192 mg/dL — ABNORMAL HIGH (ref 65–99)

## 2016-12-04 LAB — ANAEROBIC CULTURE

## 2016-12-04 LAB — HEPARIN LEVEL (UNFRACTIONATED): HEPARIN UNFRACTIONATED: 0.24 [IU]/mL — AB (ref 0.30–0.70)

## 2016-12-04 MED ORDER — LEVOFLOXACIN 500 MG PO TABS
500.0000 mg | ORAL_TABLET | ORAL | Status: DC
  Administered 2016-12-04 – 2016-12-06 (×2): 500 mg via ORAL
  Filled 2016-12-04 (×2): qty 1

## 2016-12-04 MED ORDER — SODIUM CHLORIDE 0.45 % IV SOLN
INTRAVENOUS | Status: DC
  Administered 2016-12-04 – 2016-12-05 (×2): via INTRAVENOUS
  Administered 2016-12-05: 1000 mL via INTRAVENOUS
  Administered 2016-12-06: 09:00:00 via INTRAVENOUS

## 2016-12-04 MED ORDER — APIXABAN 5 MG PO TABS
5.0000 mg | ORAL_TABLET | Freq: Two times a day (BID) | ORAL | Status: DC
  Administered 2016-12-04: 5 mg via ORAL
  Filled 2016-12-04: qty 1

## 2016-12-04 MED ORDER — HEPARIN (PORCINE) IN NACL 100-0.45 UNIT/ML-% IJ SOLN: 850.0000 [IU]/h | INTRAMUSCULAR | Status: AC

## 2016-12-04 MED ORDER — APIXABAN 2.5 MG PO TABS
2.5000 mg | ORAL_TABLET | Freq: Two times a day (BID) | ORAL | Status: DC
  Administered 2016-12-04 – 2016-12-07 (×6): 2.5 mg via ORAL
  Filled 2016-12-04 (×6): qty 1

## 2016-12-04 MED ORDER — PAROXETINE HCL 20 MG PO TABS
10.0000 mg | ORAL_TABLET | Freq: Every day | ORAL | Status: DC
  Administered 2016-12-04 – 2016-12-05 (×2): 10 mg via ORAL
  Filled 2016-12-04 (×2): qty 1

## 2016-12-04 MED ORDER — AMLODIPINE BESYLATE 5 MG PO TABS
5.0000 mg | ORAL_TABLET | Freq: Every day | ORAL | Status: DC
  Administered 2016-12-05 – 2016-12-07 (×3): 5 mg via ORAL
  Filled 2016-12-04 (×3): qty 1

## 2016-12-04 NOTE — Progress Notes (Signed)
5 Days Post-Op Procedure(s) (LRB): VIDEO ASSISTED THORACOSCOPY (VATS)/EMPYEMA (Left) Subjective: No complaints but family notes she is less verbal today  Objective: Vital signs in last 24 hours: Temp:  [97.5 F (36.4 C)-98.7 F (37.1 C)] 97.9 F (36.6 C) (11/11 0803) Pulse Rate:  [77-131] 78 (11/11 0803) Cardiac Rhythm: Atrial fibrillation;Atrial flutter (11/11 0500) Resp:  [10-25] 25 (11/11 0803) BP: (104-149)/(54-99) 113/54 (11/11 0803) SpO2:  [94 %-100 %] 100 % (11/11 0803) Weight:  [181 lb 7 oz (82.3 kg)] 181 lb 7 oz (82.3 kg) (11/11 0500)  Hemodynamic parameters for last 24 hours: CVP:  [16 mmHg] 16 mmHg  Intake/Output from previous day: 11/10 0701 - 11/11 0700 In: 332.5 [P.O.:120; I.V.:212.5] Out: 750 [Urine:750] Intake/Output this shift: No intake/output data recorded.  General appearance: alert and cooperative Neurologic: no focal motor deficit Heart: regular rate and rhythm Lungs: diminished breath sounds bibasilar  Lab Results: Recent Labs    12/03/16 0407 12/04/16 0218  WBC 12.7* 14.2*  HGB 8.1* 8.3*  HCT 23.2* 24.5*  PLT 274 275   BMET:  Recent Labs    12/03/16 0407 12/04/16 0218  NA 135 137  K 4.3 4.5  CL 103 105  CO2 20* 19*  GLUCOSE 220* 160*  BUN 170* 170*  CREATININE 6.14* 5.53*  CALCIUM 8.2* 8.4*    PT/INR: No results for input(s): LABPROT, INR in the last 72 hours. ABG    Component Value Date/Time   PHART 7.393 11/28/2016 2338   HCO3 21.7 11/28/2016 2338   TCO2 23 11/28/2016 2338   ACIDBASEDEF 3.0 (H) 11/28/2016 2338   O2SAT 92.0 11/28/2016 2338   CBG (last 3)  Recent Labs    12/03/16 1638 12/03/16 2153 12/04/16 0802  GLUCAP 214* 150* 161*    Assessment/Plan: S/P Procedure(s) (LRB): VIDEO ASSISTED THORACOSCOPY (VATS)/EMPYEMA (Left) -POD # 5 Left VATS for empyema  Chest tubes are out. CXR is unchanged- shows some loculated effusion and consolidation on the left  Ok to resume oral anticoagulation from our  standpoint   LOS: 9 days    Melrose Nakayama 12/04/2016

## 2016-12-04 NOTE — Progress Notes (Signed)
[facility] ROUNDING NOTE   Subjective:   Interval History:  71 year old lady with history of diabetes and chronic renal failure stage 3/4 baseline creatinine 3.4  8/14   Followed by Dr [name]   She also has history of atrial fibrillation  She also had a CABG 1996.  She was admitted for increased dyspnea and pleural effusion following a community acquired pneumonia. She underwent thoracentesis 11/5 for a loculated effusion and VATS procedure 11/7.  Her creatinine had peaked at 7.35 11/8 but appears to be improving and is 5.55  11/11 She appears to have fairly good urine output since 11/8 and has received no lasix   She appears comfortable   She does appear significantly azotemic and has a dropping hemoglobin and is using eliquis at full strength. I would be inclined to possible decrease dose of eliquis and will ask for pharmacy assistance   She continues on levoquin 500mg  q 48 hours       Objective:  Vital signs in last 24 hours:  Temp:  [97.5 F (36.4 C)-98.7 F (37.1 C)] 97.9 F (36.6 C) (11/11 0803) Pulse Rate:  [77-96] 78 (11/11 0803) Resp:  [10-25] 25 (11/11 0803) BP: (104-149)/(54-99) 113/54 (11/11 0803) SpO2:  [94 %-100 %] 100 % (11/11 0803) Weight:  [181 lb 7 oz (82.3 kg)] 181 lb 7 oz (82.3 kg) (11/11 0500)  Weight change: -10.3 oz (-1.2 kg) Filed Weights   11/30/16 0530 12/03/16 0400 12/04/16 0500  Weight: 189 lb 13.1 oz (86.1 kg) 184 lb 1.4 oz (83.5 kg) 181 lb 7 oz (82.3 kg)    Intake/Output: I/O last 3 completed shifts: In: 434.5 [P.O.:120; I.V.:314.5] Out: 1240 [Urine:1200; Chest Tube:40]   Intake/Output this shift:  No intake/output data recorded.  CVS- RRR RS- CTA ABD- BS present soft non-distended EXT- no edema   Basic Metabolic Panel: Recent Labs  Lab 11/28/16 0427  11/29/16 0230 11/30/16 0423 12/01/16 0402 12/02/16 0355 12/03/16 0407 12/04/16 0218  NA 132*   < > 132* 132* 131* 133* 135 137  K 4.2   < > 4.4 4.8 4.4 4.4  4.3 4.5  CL 94*   < > 95* 97* 98* 100* 103 105  CO2 21*   < > 21* 19* 19* 20* 20* 19*  GLUCOSE 135*   < > 164* 244* 163* 143* 220* 160*  BUN 120*   < > 136* 146* 154* 165* 170* 170*  CREATININE 6.20*   < > 6.54* 6.92* 7.35* 6.71* 6.14* 5.53*  CALCIUM 8.2*   < > 8.0* 7.9* 7.6* 7.9* 8.2* 8.4*  MG 1.8  --  2.0  --   --  2.2  --   --   PHOS 6.2*  --   --   --   --  8.6*  --   --    < > = values in this interval not displayed.    Liver Function Tests: Recent Labs  Lab 11/28/16 0427 11/28/16 1032 11/28/16 2108 12/01/16 0402  AST  --   --  52* 42*  ALT  --   --  37 32  ALKPHOS  --   --  71 81  BILITOT  --   --  0.9 0.8  PROT  --  5.9* 5.7* 4.7*  ALBUMIN 2.2*  --  2.2* 1.9*   No results for input(s): LIPASE, AMYLASE in the last 168 hours. No results for input(s): AMMONIA in the last 168 hours.  CBC: Recent Labs  Lab  11/28/16 0427  11/29/16 0230 11/30/16 0423 12/01/16 0402 12/02/16 0355 12/03/16 0407 12/04/16 0218  WBC 17.6*   < > 15.5* 16.6* 11.6* 12.8* 12.7* 14.2*  NEUTROABS 15.2*  --  12.9*  --   --   --   --   --   HGB 9.2*   < > 9.3* 8.6* 7.9* 8.0* 8.1* 8.3*  HCT 27.1*   < > 27.1* 25.3* 22.9* 23.0* 23.2* 24.5*  MCV 91.2   < > 90.3 91.3 90.5 90.6 89.6 90.4  PLT 251   < > 246 264 239 257 274 275   < > = values in this interval not displayed.    Cardiac Enzymes: No results for input(s): CKTOTAL, CKMB, CKMBINDEX, TROPONINI in the last 168 hours.  BNP: Invalid input(s): POCBNP  CBG: Recent Labs  Lab 12/03/16 0816 12/03/16 1225 12/03/16 1638 12/03/16 2153 12/04/16 0802  GLUCAP 231* 170* 214* 150* 161*    Microbiology: Results for orders placed or performed during the hospital encounter of 11/25/16  Culture, blood (routine x 2)     Status: None   Collection Time: 11/26/16  3:42 PM  Result Value Ref Range Status   Specimen Description BLOOD LEFT ANTECUBITAL  Final   Special Requests   Final    BOTTLES DRAWN AEROBIC AND ANAEROBIC Blood Culture results may  not be optimal due to an excessive volume of blood received in culture bottles   Culture NO GROWTH 5 DAYS  Final   Report Status 12/01/2016 FINAL  Final  Culture, blood (routine x 2)     Status: None   Collection Time: 11/26/16  3:42 PM  Result Value Ref Range Status   Specimen Description BLOOD LEFT HAND  Final   Special Requests   Final    BOTTLES DRAWN AEROBIC AND ANAEROBIC Blood Culture results may not be optimal due to an excessive volume of blood received in culture bottles   Culture NO GROWTH 5 DAYS  Final   Report Status 12/01/2016 FINAL  Final  MRSA PCR Screening     Status: None   Collection Time: 11/27/16 11:01 PM  Result Value Ref Range Status   MRSA by PCR NEGATIVE NEGATIVE Final    Comment:        The GeneXpert MRSA Assay (FDA approved for NASAL specimens only), is one component of a comprehensive MRSA colonization surveillance program. It is not intended to diagnose MRSA infection nor to guide or monitor treatment for MRSA infections.   Body fluid culture     Status: None   Collection Time: 11/28/16 10:35 AM  Result Value Ref Range Status   Specimen Description FLUID PLEURAL LEFT  Final   Special Requests Normal  Final   Gram Stain   Final    ABUNDANT WBC PRESENT,BOTH PMN AND MONONUCLEAR NO ORGANISMS SEEN    Culture NO GROWTH 3 DAYS  Final   Report Status 12/01/2016 FINAL  Final  Body fluid culture     Status: None   Collection Time: 11/29/16  5:32 PM  Result Value Ref Range Status   Specimen Description FLUID LEFT PLEURAL  Final   Special Requests ON SWAB PT ON LEVAQUIN  Final   Gram Stain   Final    ABUNDANT WBC PRESENT,BOTH PMN AND MONONUCLEAR NO ORGANISMS SEEN    Culture NO GROWTH 3 DAYS  Final   Report Status 12/03/2016 FINAL  Final  Fungus Culture With Stain     Status: None (Preliminary result)   Collection Time:  11/29/16  5:32 PM  Result Value Ref Range Status   Fungus Stain Final report  Final    Comment: (NOTE) Performed At: [facility] [place], [place] [id number] Rush Farmer MD [id number]    Fungus (Mycology) Culture PENDING  Incomplete   Fungal Source FLUID  Final    Comment: PLEURAL LEFT   Acid Fast Smear (AFB)     Status: None   Collection Time: 11/29/16  5:32 PM  Result Value Ref Range Status   AFB Specimen Processing Concentration  Final   Acid Fast Smear Negative  Final    Comment: (NOTE) Performed At: [facility] [place], [place] [id number] Rush Farmer MD [id number]    Source (AFB) FLUID  Final    Comment: PLEURAL LEFT   Anaerobic culture     Status: None   Collection Time: 11/29/16  5:32 PM  Result Value Ref Range Status   Specimen Description FLUID LEFT PLEURAL  Final   Special Requests ON SWAB  Final   Culture NO ANAEROBES ISOLATED  Final   Report Status 12/04/2016 FINAL  Final  Fungus Culture Result     Status: None   Collection Time: 11/29/16  5:32 PM  Result Value Ref Range Status   Result 1 Comment  Final    Comment: (NOTE) KOH/Calcofluor preparation:  no fungus observed. Performed At: [facility] [place], [place] [id number] Rush Farmer MD [id number]   Fungus Culture With Stain     Status: None (Preliminary result)   Collection Time: 11/29/16  5:37 PM  Result Value Ref Range Status   Fungus Stain Final report  Final    Comment: (NOTE) Performed At: [facility] [place], [place] [id number] Rush Farmer MD [id number]    Fungus (Mycology) Culture PENDING  Incomplete   Fungal Source TISSUE  Final    Comment: LEFT PLEURAL PEEL   Aerobic/Anaerobic Culture (surgical/deep wound)     Status: None (Preliminary result)   Collection Time: 11/29/16  5:37 PM  Result Value Ref Range Status   Specimen Description TISSUE  Final   Special Requests LEFT PLEURAL PEEL  Final   Gram Stain   Final    FEW WBC PRESENT,BOTH PMN AND MONONUCLEAR NO ORGANISMS SEEN    Culture   Final     NO GROWTH 3 DAYS NO ANAEROBES ISOLATED; CULTURE IN PROGRESS FOR 5 DAYS   Report Status PENDING  Incomplete  Acid Fast Smear (AFB)     Status: None   Collection Time: 11/29/16  5:37 PM  Result Value Ref Range Status   AFB Specimen Processing Concentration  Final   Acid Fast Smear Negative  Final    Comment: (NOTE) Performed At: [facility] [place], [place] [id number] Rush Farmer MD [id number]    Source (AFB) TISSUE  Final    Comment: LEFT PLEURAL PEEL   Fungus Culture Result     Status: None   Collection Time: 11/29/16  5:37 PM  Result Value Ref Range Status   Result 1 Comment  Final    Comment: (NOTE) KOH/Calcofluor preparation:  no fungus observed. Performed At: [facility] [place], [place] [id number] Rush Farmer MD [id number]   Culture, Urine     Status: None   Collection Time: 12/01/16 10:34 AM  Result Value Ref Range Status   Specimen Description URINE, CATHETERIZED  Final   Special Requests NONE  Final   Culture NO  GROWTH  Final   Report Status 12/02/2016 FINAL  Final    Coagulation Studies: No results for input(s): LABPROT, INR in the last 72 hours.  Urinalysis: No results for input(s): COLORURINE, LABSPEC, PHURINE, GLUCOSEU, HGBUR, BILIRUBINUR, KETONESUR, PROTEINUR, UROBILINOGEN, NITRITE, LEUKOCYTESUR in the last 72 hours.  Invalid input(s): APPERANCEUR    Imaging: Dg Chest Port 1 View  Result Date: 12/04/2016 CLINICAL DATA:  Pleural effusion EXAM: PORTABLE CHEST 1 VIEW COMPARISON:  December 03, 2016 FINDINGS: Left chest tube has been removed. No evident pneumothorax. Central catheter tip is at the junction of the left innominate vein and superior vena cava, stable. There is loculated pleural effusion on the left. There is airspace consolidation in both medial lung bases, more on the left than on the right, stable. No new opacity. Heart is prominent with pulmonary vascularity within normal limits.  There is aortic atherosclerosis. No bone lesions evident. There is postoperative change in the left shoulder. IMPRESSION: No pneumothorax. Loculated pleural effusion persists on the left. Airspace consolidation concerning for pneumonia noted in each medial lung base, more on the left than on the right, stable. No new opacity evident. Stable cardiac silhouette. There is aortic atherosclerosis. Aortic Atherosclerosis (ICD10-I70.0). Electronically Signed   By: Lowella Grip III M.D.   On: 12/04/2016 07:05   Dg Chest Port 1 View  Result Date: 12/03/2016 CLINICAL DATA:  Shortness of breath.  Empyema. EXAM: PORTABLE CHEST 1 VIEW COMPARISON:  December 02, 2016 FINDINGS: More superior chest tube on the left is again noted. More inferior chest tube on the left has been removed. Central catheter tip is at the junction of the left innominate vein and superior vena cava. No pneumothorax. There is consolidation throughout much of the left lung, stable. There is a small left pleural effusion. On the right, there is new patchy airspace consolidation in the medial base region. There is cardiomegaly with pulmonary vascularity within normal limits. Patient is status post coronary artery bypass grafting. There is aortic atherosclerosis. No adenopathy is demonstrable by radiography. No bone lesions. IMPRESSION: Or inferior chest tube on the left is been removed. No pneumothorax. Extensive airspace consolidation throughout much of the left lung remains. There is new patchy consolidation in the medial right base. There is stable cardiomegaly. There is aortic atherosclerosis. Aortic Atherosclerosis (ICD10-I70.0). Electronically Signed   By: Lowella Grip III M.D.   On: 12/03/2016 07:08     Medications:   . levofloxacin (LEVAQUIN) IV Stopped (12/02/16 1934)  . potassium chloride     . acetaminophen  1,000 mg Oral Q6H   Or  . acetaminophen (TYLENOL) oral liquid 160 mg/5 mL  1,000 mg Oral Q6H  . amLODipine  10 mg Oral  Daily  . apixaban  5 mg Oral BID  . atorvastatin  80 mg Oral q1800  . benzonatate  100 mg Oral TID  . bisacodyl  10 mg Oral Daily  . Chlorhexidine Gluconate Cloth  6 each Topical Daily  . dextromethorphan-guaiFENesin  1 tablet Oral BID  . insulin aspart  0-9 Units Subcutaneous TID WC  . mouth rinse  15 mL Mouth Rinse BID  . metoprolol tartrate  25 mg Oral BID  . pantoprazole  40 mg Oral Daily  . senna-docusate  1 tablet Oral QHS  . sodium chloride flush  10-40 mL Intracatheter Q12H   fentaNYL, HYDROcodone-acetaminophen, lactulose, levalbuterol, ondansetron (ZOFRAN) IV, potassium chloride, sodium chloride flush  Assessment/ Plan:  Pt is a70 y.o.yo femalewith a PMHX of CKDIV, CLL s/p chemo,  T2DM, HTN, CAD s/p CABG, prior CVA in 2017, afib on eliquis, was [facility]2018with pneumonia and pleural effusion s/p thoracentesis 11/5 and VATS  11/7 .She has had acute on chronic renal failure with a baseline creatinine of about 3 mg/dl   1. Acute on chronic kidney disease- Creatinine has plateaued and is now decreasing. UOP increasing. All positive signs. Baseline Cr appears to be about 3. Will await return of renal function. Azotemia most likely secondary to GI blood loss ( hemoptysis)   -continue to hold torsemide -continue IV fluids agree with restarting gentle hydration D5 1/2 NS 75cc/hr  -monitor renal function closely -monitor UOP closely  -daily weights, strict I/Os  2. Pneumonia w/ complex loculated left pleural effusion- continue antibiotics, s/p thoracentesis and VATS, pleural fluid w/ no growth  3. Afib w/ RVR- continue metoprolol, would consult pharmacy and consider holding eliquis 2/2 hemoptysis      4. T2DM- per primary  5. HTN-  Blood pressures are a little low would decrease amlodipine 5 mg daily  -- hydralazine already stopped         LOS: 9 Sipriano Fendley W @TODAY @11 :12 AM

## 2016-12-04 NOTE — Progress Notes (Signed)
Family Medicine Teaching Service Daily Progress Note Intern Pager: [phone]  Patient name: Christine Santos Medical record number: [id number] Date of birth: 06/22/1945 Age: 71 y.o. Gender: female  Primary Care Provider: [name], MD Consultants: Pulmonology, TCTS, Nephrology Code Status: Full   Pt Overview and Major Events to Date:  11/2: Patient admitted for acute hypoxic respiratory failure, treatment started for PNA  11/3: Pulmonology consulted when CT scan chest showed substantial consolidation of left lung with a moderate to large left pleural effusion  11/5 Thoracentesis 11/6 VATS with chest tube placement 11/7 extubated, off pressors   Assessment and Plan: Christine Santos is a 71 y.o. female presenting with cough and dyspnea. PMH is significant for T2DM with peripheral neuropathy, CLL s/p chemotherapy, HTN, CKDVI, CAD s/p CABG, hx CVA in 2017, hx of afib on eliquis.   Pneumonia: Pt is sleepy, but rousable. AxO3. Family reports that pt can act like this when not taking her Paxil. Will restart paxil and follow neuro status. There is a note that the pt fell out of bed yesterday during PT visit. Upon discussion with family, it appears that it was more of a fatigue/lethargy/decrease effort. Unsure if it due to deconditioning vs. Motivation/depression. Will continue to monitor progress. Improved respiratory status with less O2 [place], currently on 2L CXR stable compared to yesterday with no progression of PNA or right lobe infiltrate . S/p VATS 11/6. Strep Pneumo urine antigen positive. Blood cultures NGx5 days (final). Urine culture NGTD. Wound cultures NGTD. Acid fast smears negative. No fungal growth to date. WBC 12.7 > 14.2. Afebrile. Blake drain chest tube removed. Sating well on 2L O2 by [place]. Trial covertion to oral antibiotics Pt pain well controlled on oral pain meds.  - IV Levaquin 750 mg (11/2-11/4) > (11/5  - 11/11 q48h for renal adustment), azactam (11/5 - 11/10 )  - PO levaquin  500 q48h (11/11 - renal adjusted) - xopenox for elevated RR, tessalon perles prn - supplemental O2 to maintain O2 sats >/= 92%  - trend CBC  - TCTS recommendations appreciated - tylenol scheduled - norco 5-325, 1-2 q6h prn   AKI on CKD stage 4. Cr continues to improve to 6.14 > 5.53. Baseline Cr ~3.5. AKI. Gentle fluid hydration initiated per Nephrology. UOP  Adequate past 24 hrs - appreciate Nephrology recommendations  - monitor CBC and BMP - avoid nephrotoxic medications - holding torsemide   Anemia Likely ABLA on Chronic anemia. Stable at 8.3. Transfusion threshold 8.0. Hgb 10.0 on admit. Hg baseline ~10.  - Follow Hg - hold iron due to PNA  H/o diastolic CHF w/ h/o CAD s/p CABG 1996. Weights. Hemodynamically stable. Metoprolol 25 mg BID. HR stable <110 last 24hrs. At home taking torsemide daily, alternating doses of 50 mg and 100 mg; metoprolol tartrate 12.5 mg BID; norvasc 10 mg daily; atorvastatin 80 mg daily. Last echo 2017 showed EF of 55-60% with G1DD. Troponin trend: 0.83>0.84>0.67.  - hold torsemide due to AKI.  - Continue metoprolol, norvasc, atorvastatin - I/Os - Daily weights   Afib:  HR < 110. Rate control with metoprolol 25 mg BID. On eliquis at home for anticoagulation for Afib.  - telemetry - stop heparin - restart eliquis.  T2DM CBG 150 - 231 past 24 hrs. Last a1c 6.0 on 07/06/16, at home on lantus 37U daily with 5U humalog tid with meals.  - monitor CBGs qACHS - SSI   HTN: Normotensive. Hypertensive this morning, may can restart home meds tomorrow. At  home on norvasc, metoprolol, torsemide. - continue metoprolol and norvasc - hold torsemide  HLD at home on fenofibrate and atorvastatin.last lipid panel 09/04/15. LDL 99. Triglycerides 188. - continue home meds  GERDat home on protonix - continue home med  Goutat home on allopurinol  - continue home med  Anxiety at home on paxil 20 mg daily - restart paxil 10 mg, assess mood and UOP, increase  to home dose if tolerating well  PT/OT - PT recommended SNF  FEN/GI: Carb modified.  Prophylaxis: Heparin  Disposition: Inpatient pending respiratory improvement  Subjective:  Somnolent, but arousable. Pt feels more tired. AxO3. No complaints of pain Objective: Temp:  [97.5 F (36.4 C)-98.7 F (37.1 C)] 97.8 F (36.6 C) (11/11 0351) Pulse Rate:  [74-131] 93 (11/11 0500) Resp:  [10-24] 16 (11/11 0500) BP: (104-149)/(54-99) 149/62 (11/11 0600) SpO2:  [94 %-100 %] 100 % (11/11 0600) Weight:  [181 lb 7 oz (82.3 kg)] 181 lb 7 oz (82.3 kg) (11/11 0500)  Physical Exam: General: NAD, somnolent but arousable.  Cardiovascular: regular rate, regular rhythm. No murmur appreciated.  Respiratory: CTA bilaterally, Slight decreased breath sounds on Left lower field, No increased WOB on 2L O2 [place].  Abdomen: soft, NTND   Laboratory: Recent Labs  Lab 12/02/16 0355 12/03/16 0407 12/04/16 0218  WBC 12.8* 12.7* 14.2*  HGB 8.0* 8.1* 8.3*  HCT 23.0* 23.2* 24.5*  PLT 257 274 275   Recent Labs  Lab 11/28/16 1032 11/28/16 2108  12/01/16 0402 12/02/16 0355 12/03/16 0407 12/04/16 0218  NA  --  132*   < > 131* 133* 135 137  K  --  4.2   < > 4.4 4.4 4.3 4.5  CL  --  93*   < > 98* 100* 103 105  CO2  --  21*   < > 19* 20* 20* 19*  BUN  --  130*   < > 154* 165* 170* 170*  CREATININE  --  6.60*   < > 7.35* 6.71* 6.14* 5.53*  CALCIUM  --  8.3*   < > 7.6* 7.9* 8.2* 8.4*  PROT 5.9* 5.7*  --  4.7*  --   --   --   BILITOT  --  0.9  --  0.8  --   --   --   ALKPHOS  --  71  --  81  --   --   --   ALT  --  37  --  32  --   --   --   AST  --  52*  --  42*  --   --   --   GLUCOSE  --  200*   < > 163* 143* 220* 160*   < > = values in this interval not displayed.     Imaging/Diagnostic Tests: Dg Chest 2 View  Result Date: 11/25/2016 CLINICAL DATA:  71 y/o  F; chest pain with cough and hemoptysis. EXAM: CHEST  2 VIEW COMPARISON:  04/07/2014 PET-CT.  01/10/2009 chest radiograph. FINDINGS: Mild  cardiomegaly. Aortic atherosclerosis with calcification. Status post CABG. Median sternotomy wires are aligned. Extensive consolidations throughout the left mid and lower lung zones. Small to moderate left effusion. Right upper quadrant cholecystectomy clips. Left humerus rotator cuff repair anchor. Multilevel degenerative changes of the spine. IMPRESSION: Extensive consolidation throughout left lung and small to moderate left effusion probably representing pneumonia. Follow-up to resolution is recommended to exclude underlying malignancy. Electronically Signed   By: Edgardo Roys.D.  On: 11/25/2016 15:23   Ct Chest Wo Contrast  Result Date: 11/26/2016 CLINICAL DATA:  Patient with respiratory failure. EXAM: CT CHEST WITHOUT CONTRAST TECHNIQUE: Multidetector CT imaging of the chest was performed following the standard protocol without IV contrast. COMPARISON:  Chest radiograph 11/25/2016. FINDINGS: Cardiovascular: Heart is enlarged. No pericardial effusion. Coronary arterial vascular calcifications. Thoracic aortic vascular calcifications. Stable fluid density structure along the anterior right aspect of the mediastinum (image 75; series 3). Mediastinum/Nodes: Multiple prominent subcentimeter mediastinal lymph nodes are visualized. The esophagus is unremarkable. Abarrent right subclavian vein. Lungs/Pleura: Bowing of the posterior trachea secondary to expiratory phase imaging. There is a a moderate complex left pleural effusion which appears partially loculated. There is extensive consolidation throughout the left lung. Minimal aerated left upper lobe. Patchy ground-glass and consolidative opacities demonstrated throughout the right upper, right middle and right lower lobes. Trace right pleural fluid. No pneumothorax. Upper Abdomen: Status post cholecystectomy.  No acute process. Musculoskeletal: Thoracic spine degenerative changes. No aggressive or acute appearing osseous lesions. IMPRESSION: 1.  There is extensive consolidation throughout the left lung which may be secondary to pneumonia in the appropriate clinical setting. There is a complex appearing loculated moderate left pleural effusion. Recommend radiographic followup to ensure resolution. 2. Patchy ground-glass and consolidative opacities scattered throughout the right lung which may be secondary to an infectious or inflammatory process. Recommend attention on follow-up. 3. Multiple prominent subcentimeter superior mediastinal lymph nodes are nonspecific however likely reactive in etiology. Recommend attention on follow-up. 4. Cardiomegaly.  Coronary arterial atherosclerosis. 5. Aortic Atherosclerosis (ICD10-I70.0). Electronically Signed   By: Lovey Newcomer M.D.   On: 11/26/2016 14:52   Dg Chest Port 1 View  Result Date: 12/03/2016 CLINICAL DATA:  Shortness of breath.  Empyema. EXAM: PORTABLE CHEST 1 VIEW COMPARISON:  December 02, 2016 FINDINGS: More superior chest tube on the left is again noted. More inferior chest tube on the left has been removed. Central catheter tip is at the junction of the left innominate vein and superior vena cava. No pneumothorax. There is consolidation throughout much of the left lung, stable. There is a small left pleural effusion. On the right, there is new patchy airspace consolidation in the medial base region. There is cardiomegaly with pulmonary vascularity within normal limits. Patient is status post coronary artery bypass grafting. There is aortic atherosclerosis. No adenopathy is demonstrable by radiography. No bone lesions. IMPRESSION: Or inferior chest tube on the left is been removed. No pneumothorax. Extensive airspace consolidation throughout much of the left lung remains. There is new patchy consolidation in the medial right base. There is stable cardiomegaly. There is aortic atherosclerosis. Aortic Atherosclerosis (ICD10-I70.0). Electronically Signed   By: Lowella Grip III M.D.   On: 12/03/2016  07:08   Dg Chest Port 1 View  Result Date: 12/02/2016 CLINICAL DATA:  Chest tube, empyema and shortness-of-breath. EXAM: PORTABLE CHEST 1 VIEW COMPARISON:  12/01/2016 FINDINGS: Sternotomy wires unchanged. Left IJ central venous catheter unchanged with tip obliquely warranted over the SVC. Left basilar chest tube unchanged. Second catheter over the left thorax with tip towards the left apex unchanged. Moderate opacification of the left lung with slight interval worsening. Right lung is clear. Stable cardiomegaly. Remainder the exam is unchanged. IMPRESSION: Interval worsening opacification of the left lung compatible with known pleural fluid collection/empyema and associated atelectasis. Stable cardiomegaly. Tubes and lines as described. Electronically Signed   By: Marin Olp M.D.   On: 12/02/2016 07:26   Dg Chest [facility] 1 View  Result  Date: 12/01/2016 CLINICAL DATA:  Status post VATS for left empyema. EXAM: PORTABLE CHEST 1 VIEW COMPARISON:  11/30/2016 FINDINGS: Two left thoracostomy tubes and left IJ central venous catheter with tip overlying the mid-upper SVC again noted. An endotracheal tube is been removed. Left pleural and parenchymal opacities are unchanged. There is no evidence of pneumothorax. Right basilar atelectasis is again noted. Cardiomegaly and CABG changes again identified. IMPRESSION: Endotracheal tube removal without other significant change. Support apparatus as described. No evidence of pneumothorax. Electronically Signed   By: Margarette Canada M.D.   On: 12/01/2016 08:47   Dg Chest Port 1 View  Result Date: 11/30/2016 CLINICAL DATA:  Hypoxia EXAM: PORTABLE CHEST 1 VIEW COMPARISON:  November 29, 2016 FINDINGS: Endotracheal tube tip is 2.3 cm above the carina. Central catheter tip is at the junction of the left innominate vein and superior vena cava. There are chest tubes on the left, unchanged in position. No pneumothorax. Areas of consolidation throughout much of the left lung remain. No  new opacity evident. No cavitation noted on the left. Right lung appears clear. There is cardiomegaly with pulmonary vascularity within normal limits. No adenopathy. Patient is status post coronary artery bypass grafting. There is postoperative change in the left shoulder. IMPRESSION: Tube and catheter positions without evident pneumothorax. Extensive airspace consolidation on the left is stable. No new opacity on either side. Right lungs clear. Stable cardiac enlargement. Electronically Signed   By: Lowella Grip III M.D.   On: 11/30/2016 08:50   Dg Chest Port 1 View  Result Date: 11/29/2016 CLINICAL DATA:  Empyema of the lung.  Surgery follow-up. EXAM: PORTABLE CHEST 1 VIEW COMPARISON:  11/29/2016 FINDINGS: An endotracheal tube has been placed with tip measuring 1.9 cm above the carina. A left central venous catheter was placed with tip directed somewhat horizontally in the mid right mediastinum, likely representing location of the confluence of the brachiocephalic and superior vena cava veins. 2 left chest tubes. Postoperative changes in the mediastinum. Shallow inspiration. Cardiac enlargement without vascular congestion. Consolidation in the left mid and lower lung zones is improved since previous study. Probable evacuation of pleural fluid collection. No visible pneumothorax. Surgical clips in the right upper quadrant. Loop recorder. Degenerative changes in the spine and shoulders. IMPRESSION: Appliances appear in satisfactory position. Shallow inspiration. Cardiac enlargement. Improved left lung consolidation. Interval evacuation of left pleural fluid collection. Electronically Signed   By: Lucienne Capers M.D.   On: 11/29/2016 22:42   Dg Chest Port 1 View  Result Date: 11/29/2016 CLINICAL DATA:  Shortness of breath EXAM: PORTABLE CHEST 1 VIEW COMPARISON:  Portable chest x-ray of 11/28/2016, and CT chest of 11/26/2016 FINDINGS: There is little change in opacity at the left lung base consistent  with atelectasis, pneumonia, and apparently loculated left pleural effusion. The right lung is grossly clear by chest x-ray. Mild cardiomegaly is stable. Median sternotomy sutures are noted from prior CABG. No acute bony abnormality is seen. The loop recorder is no longer seen. IMPRESSION: 1. Persistent opacity at the left lung base consistent with atelectasis, pneumonia, in apparently loculated left pleural effusion. 2. Stable cardiomegaly Electronically Signed   By: Ivar Drape M.D.   On: 11/29/2016 10:33   Dg Chest Port 1 View  Result Date: 11/28/2016 CLINICAL DATA:  71 year old female status post left side thoracentesis earlier this hour. EXAM: PORTABLE CHEST 1 VIEW COMPARISON:  0545 hours today and earlier. FINDINGS: Portable AP upright view at 1032 hours. No left pneumothorax. Continued subtotal opacification of  the left hemithorax, ventilation not significantly changed. Visible mediastinal contours are stable. Cardiomegaly. Prior CABG. Left chest cardiac event recorder. Right lung appears stable and clear when allowing for portable technique. Negative visible bowel gas pattern. IMPRESSION: No pneumothorax or adverse features status post left side thoracentesis. Electronically Signed   By: Genevie Ann M.D.   On: 11/28/2016 10:53   Dg Chest Port 1 View  Result Date: 11/28/2016 CLINICAL DATA:  Shortness of breath.  Pneumonia EXAM: PORTABLE CHEST 1 VIEW COMPARISON:  Chest CT 2 days ago. FINDINGS: Extensive opacification of the left lung which is a combination of loculated pleural fluid and atelectasis/ consolidation. Cardiomegaly. Status post CABG. There is an implantable loop recorder. The right lung remains clear. IMPRESSION: 1. History of pneumonia with unchanged extensive left lung consolidation and complex pleural effusion. 2. Cardiomegaly. Electronically Signed   By: Monte Fantasia M.D.   On: 11/28/2016 08:04    Bonnita Hollow, MD 12/04/2016, 6:52 AM PGY-1, [place]  Intern pager: [phone], text pages welcome

## 2016-12-04 NOTE — Progress Notes (Signed)
ANTICOAGULATION NOTE   Patient Measurements: Height: 5' 5.5" (166.4 cm) Weight: 181 lb 7 oz (82.3 kg) IBW/kg (Calculated) : 58.15  Labs: Recent Labs    12/02/16 0355 12/02/16 0408 12/03/16 0407 12/04/16 0218  HGB 8.0*  --  8.1* 8.3*  HCT 23.0*  --  23.2* 24.5*  PLT 257  --  274 275  HEPARINUNFRC  --  0.42 0.36 0.24*  CREATININE 6.71*  --  6.14* 5.53*    Estimated Creatinine Clearance: 10 mL/min (A) (by C-G formula based on SCr of 5.53 mg/dL (H)).   Per Dr. Jason Nest note, question about dosing of Eliquis for afib in AKI.  Patient has Scr > 1.5, but she is less than 71 yrs old and over 60 kg.  Per manufacturer, full 5 mg BID dose is appropriate, although Coumadin remains drug of choice in end-stage renal failure.  However, given low Hgb, it does not seem unreasonable to reduce dose to 2.5 mg BID.   Plan:  Discussed with Dr. Justin Mend, given GI blood loss and low Hgb, will reduce Eliquis 2.5 mg BID.  Pharmacy will follow peripherally.  Uvaldo Rising, BCPS  Clinical Pharmacist Pager [phone]  12/04/2016 3:20 PM

## 2016-12-05 ENCOUNTER — Inpatient Hospital Stay (HOSPITAL_COMMUNITY): Payer: Medicare Other

## 2016-12-05 DIAGNOSIS — Z09 Encounter for follow-up examination after completed treatment for conditions other than malignant neoplasm: Secondary | ICD-10-CM

## 2016-12-05 DIAGNOSIS — Z9689 Presence of other specified functional implants: Secondary | ICD-10-CM

## 2016-12-05 DIAGNOSIS — J189 Pneumonia, unspecified organism: Secondary | ICD-10-CM

## 2016-12-05 DIAGNOSIS — J9690 Respiratory failure, unspecified, unspecified whether with hypoxia or hypercapnia: Secondary | ICD-10-CM

## 2016-12-05 LAB — BASIC METABOLIC PANEL
ANION GAP: 12 (ref 5–15)
BUN: 161 mg/dL — ABNORMAL HIGH (ref 6–20)
CALCIUM: 8.4 mg/dL — AB (ref 8.9–10.3)
CO2: 19 mmol/L — ABNORMAL LOW (ref 22–32)
Chloride: 106 mmol/L (ref 101–111)
Creatinine, Ser: 4.69 mg/dL — ABNORMAL HIGH (ref 0.44–1.00)
GFR calc Af Amer: 10 mL/min — ABNORMAL LOW (ref >60)
GFR, EST NON AFRICAN AMERICAN: 9 mL/min — AB (ref >60)
GLUCOSE: 182 mg/dL — AB (ref 65–99)
Potassium: 4.1 mmol/L (ref 3.5–5.1)
SODIUM: 137 mmol/L (ref 135–145)

## 2016-12-05 LAB — CBC
HCT: 22.5 % — ABNORMAL LOW (ref 36.0–46.0)
HEMOGLOBIN: 7.6 g/dL — AB (ref 12.0–15.0)
MCH: 31.1 pg (ref 26.0–34.0)
MCHC: 33.8 g/dL (ref 30.0–36.0)
MCV: 92.2 fL (ref 78.0–100.0)
Platelets: 258 10*3/uL (ref 150–400)
RBC: 2.44 MIL/uL — ABNORMAL LOW (ref 3.87–5.11)
RDW: 15.4 % (ref 11.5–15.5)
WBC: 15.1 10*3/uL — ABNORMAL HIGH (ref 4.0–10.5)

## 2016-12-05 LAB — GLUCOSE, CAPILLARY
GLUCOSE-CAPILLARY: 267 mg/dL — AB (ref 65–99)
Glucose-Capillary: 185 mg/dL — ABNORMAL HIGH (ref 65–99)
Glucose-Capillary: 248 mg/dL — ABNORMAL HIGH (ref 65–99)

## 2016-12-05 LAB — HEMOGLOBIN AND HEMATOCRIT, BLOOD
HEMATOCRIT: 24.1 % — AB (ref 36.0–46.0)
HEMOGLOBIN: 8.1 g/dL — AB (ref 12.0–15.0)

## 2016-12-05 LAB — AEROBIC/ANAEROBIC CULTURE W GRAM STAIN (SURGICAL/DEEP WOUND)

## 2016-12-05 MED ORDER — GLUCERNA SHAKE PO LIQD
237.0000 mL | Freq: Two times a day (BID) | ORAL | Status: DC
  Administered 2016-12-06 – 2016-12-07 (×2): 237 mL via ORAL

## 2016-12-05 MED ORDER — METRONIDAZOLE IN NACL 5-0.79 MG/ML-% IV SOLN
500.0000 mg | Freq: Four times a day (QID) | INTRAVENOUS | Status: DC
  Administered 2016-12-05 – 2016-12-06 (×4): 500 mg via INTRAVENOUS
  Filled 2016-12-05 (×4): qty 100

## 2016-12-05 MED ORDER — PAROXETINE HCL 20 MG PO TABS
20.0000 mg | ORAL_TABLET | Freq: Every day | ORAL | Status: DC
  Administered 2016-12-06 – 2016-12-07 (×2): 20 mg via ORAL
  Filled 2016-12-05 (×2): qty 1

## 2016-12-05 NOTE — Progress Notes (Signed)
Initial Nutrition Assessment  DOCUMENTATION CODES:   Not applicable  INTERVENTION:   - Glucerna shake BID; each supplement provides 220 kcals and 10 grams of protein  NUTRITION DIAGNOSIS:   Inadequate oral intake related to acute illness(acute on chronic RF, acute on chronic CKD St IV, pneumonia) as evidenced by energy intake < or equal to 50% for > or equal to 5 days, per patient/family report.  GOAL:   Patient will meet greater than or equal to 90% of their needs  MONITOR:   PO intake, Supplement acceptance, Labs, Weight trends  REASON FOR ASSESSMENT:   Consult Assessment of nutrition requirement/status  ASSESSMENT:   Pt is a 71 year old female admitted on 11/2 with acute RF, pneumonia, and pleural effusion. She has experienced worsening kidney function during her stay. PMH of CKD stage IV, CLL s/p chemo, DM with peripheral neuropathy, HTN, CAD s/p CABG, prior CVA in 2017, Afib on Eliquis  Pt was drowsy during visit and gave minimal responses to questions. Her family at bedside answered most questions. They report that the pts appetite has been decreased for the past 3-4 weeks. She still eats 3 meals as usual with family, however she can only eat small portions (~ 50%) d/t early satiety. Prior to the past 3 weeks, they report pt having a normal appetite and intake of 3 meals per day along with snacking.  Pt does not drink nutritional supplements at home. Discussed with family the benefits of nutrition supplements. They are agreeable.  They feel that her appetite has increased in the past couple of days. RN reports that she ate most of her breakfast. Per chart, pt ate 75% of her breakfast. Her intake has varied between 5-75% since admission, averaging >60%. Family feels that her appetite is returning. They deny n/v in the past few days.  Pt feels that she may have gained weight recently. She is unsure of her UBW. Per chart, pt weighed 181 lbs on 11/11.  Her weight has remained  stable the past year, ranging from 175-180 lbs.  Medications- Novolog, Protonix, Senokot  Labs- CBGs: [phone]  NUTRITION - FOCUSED PHYSICAL EXAM:    Most Recent Value  Orbital Region  No depletion  Upper Arm Region  No depletion  Thoracic and Lumbar Region  No depletion  Buccal Region  No depletion  Temple Region  No depletion  Clavicle Bone Region  No depletion  Clavicle and Acromion Bone Region  No depletion  Scapular Bone Region  No depletion  Dorsal Hand  No depletion  Patellar Region  No depletion  Anterior Thigh Region  No depletion  Posterior Calf Region  No depletion  Edema (RD Assessment)  None  Hair  Reviewed  Eyes  Reviewed  Mouth  Reviewed  Skin  Reviewed  Nails  Reviewed     Mild pitting edema on RLE and LLE  Diet Order:  Diet Carb Modified Fluid consistency: Thin; Room service appropriate? Yes  EDUCATION NEEDS:   No education needs have been identified at this time  Skin:  Skin Assessment: Skin Integrity Issues: Skin Integrity Issues:: Other (Comment) Other: Deep tissue injury on coccyx  Last BM:  11/12  Height:   Ht Readings from Last 1 Encounters:  11/28/16 5' 5.5" (1.664 m)    Weight:   Wt Readings from Last 1 Encounters:  12/04/16 181 lb 7 oz (82.3 kg)    Ideal Body Weight:  57.9 kg  BMI:  Body mass index is 29.73 kg/m.  Estimated  Nutritional Needs:   Kcal:  2000-2200 kcals  Protein:  100-110 grams  Fluid:  >/= 2 L    [place] Dietetic Intern Pager: [phone] 12/05/2016 1:07 PM

## 2016-12-05 NOTE — Progress Notes (Addendum)
6 Days Post-Op Procedure(s) (LRB): VIDEO ASSISTED THORACOSCOPY (VATS)/EMPYEMA (Left) Subjective: Status post left VATS for drainage of empyema and decortication of left lower lobe Even though operative cultures are negative I would still recommend at least 2-3 weeks of antibiotics for this frail with chronic renal failure Chest tubes have been removed, chest x-ray shows significant improvement in aeration of the left lower lobe Objective: Vital signs in last 24 hours: Temp:  [97.6 F (36.4 C)-98.1 F (36.7 C)] 97.6 F (36.4 C) (11/12 1236) Pulse Rate:  [76-103] 80 (11/12 1600) Cardiac Rhythm: Atrial fibrillation (11/12 1600) Resp:  [10-26] 17 (11/12 1600) BP: (119-155)/(39-109) 144/59 (11/12 1600) SpO2:  [94 %-100 %] 97 % (11/12 1600)  Hemodynamic parameters for last 24 hours: CVP:  [14 mmHg] 14 mmHg  Intake/Output from previous day: 11/11 0701 - 11/12 0700 In: 1861.3 [P.O.:420; I.V.:1441.3] Out: 600 [Urine:200; Stool:400] Intake/Output this shift: Total I/O In: 1190 [P.O.:540; I.V.:600; IV Piggyback:50] Out: 204 [Urine:203; Stool:1]  Chest incision clean and dry Breath sounds improved at left lung base   Lab Results: Recent Labs    12/04/16 0218 12/05/16 0436 12/05/16 1438  WBC 14.2* 15.1*  --   HGB 8.3* 7.6* 8.1*  HCT 24.5* 22.5* 24.1*  PLT 275 258  --    BMET:  Recent Labs    12/04/16 0218 12/05/16 0436  NA 137 137  K 4.5 4.1  CL 105 106  CO2 19* 19*  GLUCOSE 160* 182*  BUN 170* 161*  CREATININE 5.53* 4.69*  CALCIUM 8.4* 8.4*    PT/INR: No results for input(s): LABPROT, INR in the last 72 hours. ABG    Component Value Date/Time   PHART 7.393 11/28/2016 2338   HCO3 21.7 11/28/2016 2338   TCO2 23 11/28/2016 2338   ACIDBASEDEF 3.0 (H) 11/28/2016 2338   O2SAT 92.0 11/28/2016 2338   CBG (last 3)  Recent Labs    12/05/16 0817 12/05/16 1234 12/05/16 1745  GLUCAP 185* 248* 267*    Assessment/Plan: S/P Procedure(s) (LRB): VIDEO ASSISTED  THORACOSCOPY (VATS)/EMPYEMA (Left) Patient recovering from left VATS. Will remove chest tube sutures at office visit which will be arranged at discharge   LOS: 10 days    Tharon Aquas Trigt III 12/05/2016

## 2016-12-05 NOTE — Progress Notes (Signed)
Social visit:  I have been PCP for Christine Santos for ~25 years.  I have been remiss in not visiting sooner.  I am happy to see her slow recovery from a severe pneumonia with empyema complicated by acute on chronic renal failure.  The patient and family are very complimentary about the care they have received from the physicians and nurses.  Thanks to all for the great care, which they have provided.

## 2016-12-05 NOTE — Progress Notes (Signed)
Family Medicine Teaching Service Daily Progress Note Intern Pager: [phone]  Patient name: Christine Santos Medical record number: [id number] Date of birth: 03/13/1945 Age: 71 y.o. Gender: female  Primary Care Provider: [name], MD Consultants: Pulmonology, TCTS, Nephrology Code Status: Full   Pt Overview and Major Events to Date:  11/2: Patient admitted for acute hypoxic respiratory failure, treatment started for PNA  11/3: Pulmonology consulted when CT scan chest showed substantial consolidation of left lung with a moderate to large left pleural effusion  11/5 Thoracentesis 11/6 VATS with chest tube placement 11/7 extubated, off pressors   Assessment and Plan: Christine Santos is a 71 y.o. female presenting with cough and dyspnea. PMH is significant for T2DM with peripheral neuropathy, CLL s/p chemotherapy, HTN, CKDVI, CAD s/p CABG, hx CVA in 2017, hx of afib on eliquis.   Pneumonia: Pt is more alert this morning. Feels that her breathing is better. Improved respiratory status stable on  on 2L [place]. CXR left lobe infiltrate stable, right lobe infiltrate improved . S/p VATS 11/6. Strep Pneumo urine antigen positive. Blood cultures NGx5 days (final). Urine culture NGTD. Wound cultures NGTD. Acid fast smears negative. No fungal growth to date. WBC continues to increase to 14.2 > 15.1. Afebrile. Seems to be tolerating PO antibiotics well. d/cdIV Levaquin 750 mg (11/2-11/4) > (11/5  - 11/11 q48h for renal adustment), azactam (11/5 - 11/10 )  - PO levaquin 500 q48h (11/11 - renal adjusted) - xopenox for elevated RR, tessalon perles prn - supplemental O2 to maintain O2 sats >/= 92%  - trend CBC  - TCTS recommendations appreciated - tylenol scheduled - norco 5-325, 1-2 q6h prn   AKI on CKD stage 4. Cr continues to improve to 4.69. Baseline Cr ~3.5. AKI. Gentle fluid hydration initiated per Nephrology. UOP  Adequate past 24 hrs - appreciate Nephrology recommendations  - monitor CBC and  BMP - avoid nephrotoxic medications - holding torsemide   Anemia Likely ABLA on Chronic anemia. Hb decreased to 7.6. Transfusion threshold 8.0. Hgb 10.0 on admit. Hg baseline. Switched to eliquis anticoagulation full dose 5mg  BID. Reduced by nephrology/pharmacy due to poor renal clearance.  ~10. Considering transfusion of 1 unit b/c pt is below threshold, but pt has persistant lower extremity edema, poor renal fxn. Concern for possible volume overload. Appears stable otherwise. Will monitor Hg. If continues to drop, will likely transfuse - Follow Hg - hold iron due to PNA  H/o diastolic CHF w/ h/o CAD s/p CABG 1996. Weights stable. Hemodynamically stable. Metoprolol 25 mg BID. HR stable <110 last 24hrs. At home taking torsemide daily, alternating doses of 50 mg and 100 mg; metoprolol tartrate 12.5 mg BID; norvasc 10 mg daily; atorvastatin 80 mg daily. Last echo 2017 showed EF of 55-60% with G1DD. Troponin trend: 0.83>0.84>0.67.  - hold torsemide due to AKI.  - Continue metoprolol, norvasc, atorvastatin - I/Os - Daily weights   Afib:  HR < 110. Rate control with metoprolol 25 mg BID. On eliquis at home for anticoagulation for Afib. Restarted elequis yesterday, reduced to 2.5 mg BID, due to poor renal clearance. Hg did drop to 7.6. Will monitor this pt, if continues to drop, consider transfusion of 1 RBC unit  - telemetry - eliquis 2.5 mg BID  T2DM CBG 150 - 231 past 24 hrs. Last a1c 6.0 on 07/06/16, at home on lantus 37U daily with 5U humalog tid with meals.  - monitor CBGs qACHS - SSI   HTN: Normotensive. At home on  norvasc, metoprolol, torsemide. - continue metoprolol and norvasc - hold torsemide  HLD at home on fenofibrate and atorvastatin.last lipid panel 09/04/15. LDL 99. Triglycerides 188. - continue home meds  GERDat home on protonix - continue home med  Goutat home on allopurinol  - continue home med  Anxiety at home on paxil 20 mg daily. restarted paxil 10 mg.   mood improved and and urine output adequate. Will increase to home dose - cont to monitor mood and urine output  PT/OT - PT recommended SNF  FEN/GI: Carb modified.  Prophylaxis: Heparin  Disposition: Inpatient pending respiratory improvement  Subjective:  Alert and sitting in chair. Feels breathing is better. Complains of mild chronic "tail bone pain." No other complaints.   Objective: Temp:  [97.6 F (36.4 C)-98.1 F (36.7 C)] 98.1 F (36.7 C) (11/12 0827) Pulse Rate:  [74-103] 86 (11/12 0800) Resp:  [10-26] 18 (11/12 0800) BP: (117-148)/(54-76) 139/64 (11/12 0800) SpO2:  [94 %-100 %] 100 % (11/12 0800)  Physical Exam: General: NAD, resting alert in chair Cardiovascular: regular rate, regular rhythm. No murmur appreciated.  Respiratory: CTA bilaterally, Slight decreased breath sounds on Left lower field, No increased WOB on 2L O2 [place].  Abdomen: soft, NTND   Laboratory: Recent Labs  Lab 12/03/16 0407 12/04/16 0218 12/05/16 0436  WBC 12.7* 14.2* 15.1*  HGB 8.1* 8.3* 7.6*  HCT 23.2* 24.5* 22.5*  PLT 274 275 258   Recent Labs  Lab 11/28/16 1032 11/28/16 2108  12/01/16 0402  12/03/16 0407 12/04/16 0218 12/05/16 0436  NA  --  132*   < > 131*   < > 135 137 137  K  --  4.2   < > 4.4   < > 4.3 4.5 4.1  CL  --  93*   < > 98*   < > 103 105 106  CO2  --  21*   < > 19*   < > 20* 19* 19*  BUN  --  130*   < > 154*   < > 170* 170* 161*  CREATININE  --  6.60*   < > 7.35*   < > 6.14* 5.53* 4.69*  CALCIUM  --  8.3*   < > 7.6*   < > 8.2* 8.4* 8.4*  PROT 5.9* 5.7*  --  4.7*  --   --   --   --   BILITOT  --  0.9  --  0.8  --   --   --   --   ALKPHOS  --  71  --  81  --   --   --   --   ALT  --  37  --  32  --   --   --   --   AST  --  52*  --  42*  --   --   --   --   GLUCOSE  --  200*   < > 163*   < > 220* 160* 182*   < > = values in this interval not displayed.     Imaging/Diagnostic Tests: Dg Chest 2 View  Result Date: 11/25/2016 CLINICAL DATA:  71 y/o  F; chest  pain with cough and hemoptysis. EXAM: CHEST  2 VIEW COMPARISON:  04/07/2014 PET-CT.  01/10/2009 chest radiograph. FINDINGS: Mild cardiomegaly. Aortic atherosclerosis with calcification. Status post CABG. Median sternotomy wires are aligned. Extensive consolidations throughout the left mid and lower lung zones. Small to moderate left effusion. Right upper quadrant  cholecystectomy clips. Left humerus rotator cuff repair anchor. Multilevel degenerative changes of the spine. IMPRESSION: Extensive consolidation throughout left lung and small to moderate left effusion probably representing pneumonia. Follow-up to resolution is recommended to exclude underlying malignancy. Electronically Signed   By: Kristine Garbe M.D.   On: 11/25/2016 15:23   Ct Chest Wo Contrast  Result Date: 11/26/2016 CLINICAL DATA:  Patient with respiratory failure. EXAM: CT CHEST WITHOUT CONTRAST TECHNIQUE: Multidetector CT imaging of the chest was performed following the standard protocol without IV contrast. COMPARISON:  Chest radiograph 11/25/2016. FINDINGS: Cardiovascular: Heart is enlarged. No pericardial effusion. Coronary arterial vascular calcifications. Thoracic aortic vascular calcifications. Stable fluid density structure along the anterior right aspect of the mediastinum (image 75; series 3). Mediastinum/Nodes: Multiple prominent subcentimeter mediastinal lymph nodes are visualized. The esophagus is unremarkable. Abarrent right subclavian vein. Lungs/Pleura: Bowing of the posterior trachea secondary to expiratory phase imaging. There is a a moderate complex left pleural effusion which appears partially loculated. There is extensive consolidation throughout the left lung. Minimal aerated left upper lobe. Patchy ground-glass and consolidative opacities demonstrated throughout the right upper, right middle and right lower lobes. Trace right pleural fluid. No pneumothorax. Upper Abdomen: Status post cholecystectomy.  No acute  process. Musculoskeletal: Thoracic spine degenerative changes. No aggressive or acute appearing osseous lesions. IMPRESSION: 1. There is extensive consolidation throughout the left lung which may be secondary to pneumonia in the appropriate clinical setting. There is a complex appearing loculated moderate left pleural effusion. Recommend radiographic followup to ensure resolution. 2. Patchy ground-glass and consolidative opacities scattered throughout the right lung which may be secondary to an infectious or inflammatory process. Recommend attention on follow-up. 3. Multiple prominent subcentimeter superior mediastinal lymph nodes are nonspecific however likely reactive in etiology. Recommend attention on follow-up. 4. Cardiomegaly.  Coronary arterial atherosclerosis. 5. Aortic Atherosclerosis (ICD10-I70.0). Electronically Signed   By: Lovey Newcomer M.D.   On: 11/26/2016 14:52   Dg Chest Port 1 View  Result Date: 12/05/2016 CLINICAL DATA:  Pleural effusion and shortness of breath EXAM: PORTABLE CHEST 1 VIEW COMPARISON:  December 04, 2016 FINDINGS: Central catheter tip is at the junction of the left innominate vein and superior vena cava, stable. No pneumothorax. There is loculated pleural effusion on the left with consolidation in the left lower lobe region. There has been partial clearing of patchy opacity from the medial right base. Right lung is clear except for mild medial right base atelectasis. Heart remains prominent with pulmonary vascularity within normal limits. There is aortic atherosclerosis. Postoperative change noted in left shoulder. Patient is status post coronary artery bypass grafting. IMPRESSION: Persistent loculated effusion on the left with airspace consolidation left lower lobe. Mild atelectasis medial right base. Right lung otherwise clear. There has been partial clearing of consolidation from the medial right base. Cardiac prominence is stable. No pneumothorax. No new opacity evident.  There is aortic atherosclerosis. Aortic Atherosclerosis (ICD10-I70.0). Electronically Signed   By: Lowella Grip III M.D.   On: 12/05/2016 07:27   Dg Chest Port 1 View  Result Date: 12/04/2016 CLINICAL DATA:  Pleural effusion EXAM: PORTABLE CHEST 1 VIEW COMPARISON:  December 03, 2016 FINDINGS: Left chest tube has been removed. No evident pneumothorax. Central catheter tip is at the junction of the left innominate vein and superior vena cava, stable. There is loculated pleural effusion on the left. There is airspace consolidation in both medial lung bases, more on the left than on the right, stable. No new opacity. Heart is prominent with  pulmonary vascularity within normal limits. There is aortic atherosclerosis. No bone lesions evident. There is postoperative change in the left shoulder. IMPRESSION: No pneumothorax. Loculated pleural effusion persists on the left. Airspace consolidation concerning for pneumonia noted in each medial lung base, more on the left than on the right, stable. No new opacity evident. Stable cardiac silhouette. There is aortic atherosclerosis. Aortic Atherosclerosis (ICD10-I70.0). Electronically Signed   By: Lowella Grip III M.D.   On: 12/04/2016 07:05   Dg Chest Port 1 View  Result Date: 12/03/2016 CLINICAL DATA:  Shortness of breath.  [place]ema. EXAM: PORTABLE CHEST 1 VIEW COMPARISON:  December 02, 2016 FINDINGS: More superior chest tube on the left is again noted. More inferior chest tube on the left has been removed. Central catheter tip is at the junction of the left innominate vein and superior vena cava. No pneumothorax. There is consolidation throughout much of the left lung, stable. There is a small left pleural effusion. On the right, there is new patchy airspace consolidation in the medial base region. There is cardiomegaly with pulmonary vascularity within normal limits. Patient is status post coronary artery bypass grafting. There is aortic atherosclerosis. No  adenopathy is demonstrable by radiography. No bone lesions. IMPRESSION: Or inferior chest tube on the left is been removed. No pneumothorax. Extensive airspace consolidation throughout much of the left lung remains. There is new patchy consolidation in the medial right base. There is stable cardiomegaly. There is aortic atherosclerosis. Aortic Atherosclerosis (ICD10-I70.0). Electronically Signed   By: Lowella Grip III M.D.   On: 12/03/2016 07:08   Dg Chest Port 1 View  Result Date: 12/02/2016 CLINICAL DATA:  Chest tube, empyema and shortness-of-breath. EXAM: PORTABLE CHEST 1 VIEW COMPARISON:  12/01/2016 FINDINGS: Sternotomy wires unchanged. Left IJ central venous catheter unchanged with tip obliquely warranted over the SVC. Left basilar chest tube unchanged. Second catheter over the left thorax with tip towards the left apex unchanged. Moderate opacification of the left lung with slight interval worsening. Right lung is clear. Stable cardiomegaly. Remainder the exam is unchanged. IMPRESSION: Interval worsening opacification of the left lung compatible with known pleural fluid collection/empyema and associated atelectasis. Stable cardiomegaly. Tubes and lines as described. Electronically Signed   By: Marin Olp M.D.   On: 12/02/2016 07:26   Dg Chest Port 1 View  Result Date: 12/01/2016 CLINICAL DATA:  Status post VATS for left empyema. EXAM: PORTABLE CHEST 1 VIEW COMPARISON:  11/30/2016 FINDINGS: Two left thoracostomy tubes and left IJ central venous catheter with tip overlying the mid-upper SVC again noted. An endotracheal tube is been removed. Left pleural and parenchymal opacities are unchanged. There is no evidence of pneumothorax. Right basilar atelectasis is again noted. Cardiomegaly and CABG changes again identified. IMPRESSION: Endotracheal tube removal without other significant change. Support apparatus as described. No evidence of pneumothorax. Electronically Signed   By: Margarette Canada M.D.    On: 12/01/2016 08:47   Dg Chest Port 1 View  Result Date: 11/30/2016 CLINICAL DATA:  Hypoxia EXAM: PORTABLE CHEST 1 VIEW COMPARISON:  November 29, 2016 FINDINGS: Endotracheal tube tip is 2.3 cm above the carina. Central catheter tip is at the junction of the left innominate vein and superior vena cava. There are chest tubes on the left, unchanged in position. No pneumothorax. Areas of consolidation throughout much of the left lung remain. No new opacity evident. No cavitation noted on the left. Right lung appears clear. There is cardiomegaly with pulmonary vascularity within normal limits. No adenopathy. Patient is status  post coronary artery bypass grafting. There is postoperative change in the left shoulder. IMPRESSION: Tube and catheter positions without evident pneumothorax. Extensive airspace consolidation on the left is stable. No new opacity on either side. Right lungs clear. Stable cardiac enlargement. Electronically Signed   By: Lowella Grip III M.D.   On: 11/30/2016 08:50   Dg Chest Port 1 View  Result Date: 11/29/2016 CLINICAL DATA:  Empyema of the lung.  Surgery follow-up. EXAM: PORTABLE CHEST 1 VIEW COMPARISON:  11/29/2016 FINDINGS: An endotracheal tube has been placed with tip measuring 1.9 cm above the carina. A left central venous catheter was placed with tip directed somewhat horizontally in the mid right mediastinum, likely representing location of the confluence of the brachiocephalic and superior vena cava veins. 2 left chest tubes. Postoperative changes in the mediastinum. Shallow inspiration. Cardiac enlargement without vascular congestion. Consolidation in the left mid and lower lung zones is improved since previous study. Probable evacuation of pleural fluid collection. No visible pneumothorax. Surgical clips in the right upper quadrant. Loop recorder. Degenerative changes in the spine and shoulders. IMPRESSION: Appliances appear in satisfactory position. Shallow inspiration.  Cardiac enlargement. Improved left lung consolidation. Interval evacuation of left pleural fluid collection. Electronically Signed   By: Lucienne Capers M.D.   On: 11/29/2016 22:42   Dg Chest Port 1 View  Result Date: 11/29/2016 CLINICAL DATA:  Shortness of breath EXAM: PORTABLE CHEST 1 VIEW COMPARISON:  Portable chest x-ray of 11/28/2016, and CT chest of 11/26/2016 FINDINGS: There is little change in opacity at the left lung base consistent with atelectasis, pneumonia, and apparently loculated left pleural effusion. The right lung is grossly clear by chest x-ray. Mild cardiomegaly is stable. Median sternotomy sutures are noted from prior CABG. No acute bony abnormality is seen. The loop recorder is no longer seen. IMPRESSION: 1. Persistent opacity at the left lung base consistent with atelectasis, pneumonia, in apparently loculated left pleural effusion. 2. Stable cardiomegaly Electronically Signed   By: Ivar Drape M.D.   On: 11/29/2016 10:33   Dg Chest Port 1 View  Result Date: 11/28/2016 CLINICAL DATA:  71 year old female status post left side thoracentesis earlier this hour. EXAM: PORTABLE CHEST 1 VIEW COMPARISON:  0545 hours today and earlier. FINDINGS: Portable AP upright view at 1032 hours. No left pneumothorax. Continued subtotal opacification of the left hemithorax, ventilation not significantly changed. Visible mediastinal contours are stable. Cardiomegaly. Prior CABG. Left chest cardiac event recorder. Right lung appears stable and clear when allowing for portable technique. Negative visible bowel gas pattern. IMPRESSION: No pneumothorax or adverse features status post left side thoracentesis. Electronically Signed   By: Genevie Ann M.D.   On: 11/28/2016 10:53   Dg Chest Port 1 View  Result Date: 11/28/2016 CLINICAL DATA:  Shortness of breath.  Pneumonia EXAM: PORTABLE CHEST 1 VIEW COMPARISON:  Chest CT 2 days ago. FINDINGS: Extensive opacification of the left lung which is a combination of  loculated pleural fluid and atelectasis/ consolidation. Cardiomegaly. Status post CABG. There is an implantable loop recorder. The right lung remains clear. IMPRESSION: 1. History of pneumonia with unchanged extensive left lung consolidation and complex pleural effusion. 2. Cardiomegaly. Electronically Signed   By: Monte Fantasia M.D.   On: 11/28/2016 08:04    Bonnita Hollow, MD 12/05/2016, 9:14 AM PGY-1, [facility] Intern pager: [phone], text pages welcome

## 2016-12-05 NOTE — Progress Notes (Signed)
Subjective: Patient is doing well today, no complaints.   Objective: Vital signs in last 24 hours: Temp:  [97.6 F (36.4 C)-98.1 F (36.7 C)] 98.1 F (36.7 C) (11/12 0827) Pulse Rate:  [74-103] 82 (11/12 1000) Resp:  [10-26] 22 (11/12 1000) BP: (117-148)/(39-76) 119/39 (11/12 1000) SpO2:  [94 %-100 %] 100 % (11/12 1000) Weight change:   Intake/Output from previous day: 11/11 0701 - 11/12 0700 In: 1861.3 [P.O.:420; I.V.:1441.3] Out: 600 [Urine:200; Stool:400] Intake/Output this shift: Total I/O In: 465 [P.O.:240; I.V.:225] Out: 2 [Urine:1; Stool:1]  General appearance: alert, cooperative, no distress and [place] in place Resp: clear to auscultation bilaterally Cardio: irregularly irregular rhythm GI: soft, non-tender, non-distended Extremities: edema +1 bl Neurologic: Grossly normal  Lab Results: Recent Labs    12/04/16 0218 12/05/16 0436  WBC 14.2* 15.1*  HGB 8.3* 7.6*  HCT 24.5* 22.5*  PLT 275 258   BMET:  Recent Labs    12/04/16 0218 12/05/16 0436  NA 137 137  K 4.5 4.1  CL 105 106  CO2 19* 19*  GLUCOSE 160* 182*  BUN 170* 161*  CREATININE 5.53* 4.69*  CALCIUM 8.4* 8.4*   No results for input(s): PTH in the last 72 hours. Iron Studies: No results for input(s): IRON, TIBC, TRANSFERRIN, FERRITIN in the last 72 hours.  Studies/Results: Dg Chest Port 1 View  Result Date: 12/05/2016 CLINICAL DATA:  Pleural effusion and shortness of breath EXAM: PORTABLE CHEST 1 VIEW COMPARISON:  December 04, 2016 FINDINGS: Central catheter tip is at the junction of the left innominate vein and superior vena cava, stable. No pneumothorax. There is loculated pleural effusion on the left with consolidation in the left lower lobe region. There has been partial clearing of patchy opacity from the medial right base. Right lung is clear except for mild medial right base atelectasis. Heart remains prominent with pulmonary vascularity within normal limits. There is aortic atherosclerosis.  Postoperative change noted in left shoulder. Patient is status post coronary artery bypass grafting. IMPRESSION: Persistent loculated effusion on the left with airspace consolidation left lower lobe. Mild atelectasis medial right base. Right lung otherwise clear. There has been partial clearing of consolidation from the medial right base. Cardiac prominence is stable. No pneumothorax. No new opacity evident. There is aortic atherosclerosis. Aortic Atherosclerosis (ICD10-I70.0). Electronically Signed   By: Lowella Grip III M.D.   On: 12/05/2016 07:27   Dg Chest Port 1 View  Result Date: 12/04/2016 CLINICAL DATA:  Pleural effusion EXAM: PORTABLE CHEST 1 VIEW COMPARISON:  December 03, 2016 FINDINGS: Left chest tube has been removed. No evident pneumothorax. Central catheter tip is at the junction of the left innominate vein and superior vena cava, stable. There is loculated pleural effusion on the left. There is airspace consolidation in both medial lung bases, more on the left than on the right, stable. No new opacity. Heart is prominent with pulmonary vascularity within normal limits. There is aortic atherosclerosis. No bone lesions evident. There is postoperative change in the left shoulder. IMPRESSION: No pneumothorax. Loculated pleural effusion persists on the left. Airspace consolidation concerning for pneumonia noted in each medial lung base, more on the left than on the right, stable. No new opacity evident. Stable cardiac silhouette. There is aortic atherosclerosis. Aortic Atherosclerosis (ICD10-I70.0). Electronically Signed   By: Lowella Grip III M.D.   On: 12/04/2016 07:05    Scheduled: . amLODipine  5 mg Oral Daily  . apixaban  2.5 mg Oral BID  . atorvastatin  80 mg Oral  q1800  . benzonatate  100 mg Oral TID  . bisacodyl  10 mg Oral Daily  . Chlorhexidine Gluconate Cloth  6 each Topical Daily  . dextromethorphan-guaiFENesin  1 tablet Oral BID  . insulin aspart  0-9 Units  Subcutaneous TID WC  . levofloxacin  500 mg Oral Q48H  . metoprolol tartrate  25 mg Oral BID  . pantoprazole  40 mg Oral Daily  . [START ON 12/06/2016] PARoxetine  20 mg Oral Daily  . senna-docusate  1 tablet Oral QHS  . sodium chloride flush  10-40 mL Intracatheter Q12H    Assessment/Plan: Pt is a70 y.o.yo femalewith a PMHX of CKDIV, CLL s/p chemo, T2DM, HTN, CAD s/p CABG, prior CVA in 2017, afib on eliquis, was [facility] on[facility]2018with pneumonia and pleural effusion.She has had worsening kidney function during her stay.   1. Acute on chronic kidney disease- Creatinine improving, patient azotemic, not uremic. Good UOP.  -monitor renal function closely -monitor UOP closely  -daily weights, strict I/Os -would like to see BUN decrease, will monitor, eliquis dose reduced yesterday  2. Pneumonia w/ complex loculated left pleural effusion- continue antibiotics, s/p thoracentesis and VATS, pleural fluid w/ no growth  3. Afib w/ RVR- continue metoprolol, eliquis restarted and dose reduced 2/2 high BUN  4. T2DM- per primary  5. HTN- stable, continue hydralazine and norvasc  Dispo: pending clinical improvement, likely SNF    LOS: 10 days   Angela C Riccio 12/05/2016,10:12 AM

## 2016-12-05 NOTE — Progress Notes (Signed)
Physical Therapy Treatment Patient Details Name: Christine Santos MRN: [id number] DOB: November 21, 1945 Today's Date: 12/05/2016    History of Present Illness Pt adm with PNA and lt empyema. Pt to underwent VATS 11/6 PM. PMH - CLL, CHF, CKD Stage V, Afib, CVA, CAD    PT Comments    Encouragement needed throughout session. Pt agreed to ambulate after working with OT using BSC. Frequent cueing for hand placement during sit to stand transfers for safety. Pt began to sit down without warning when needing a rest break x 3. MinA-modA +2 for gait due to impulsiveness and lack of awareness to safety. Pt very restless towards end of session rushed to sit at end of bed.  MaxA +3 for sit to stand transfer and side stepping towards HOB. Mod A+2  for sit to supine. Once in bed vc for technique for lower body positioning. Pt will continue to benefit from PT to help increase their independence and safety with mobility.  Follow Up Recommendations  SNF;Supervision/Assistance - 24 hour     Equipment Recommendations  None recommended by PT    Recommendations for Other Services       Precautions / Restrictions Precautions Precautions: Fall Restrictions Weight Bearing Restrictions: No    Mobility  Bed Mobility Overal bed mobility: Needs Assistance Bed Mobility: Sit to Supine       Sit to supine: +2 for physical assistance;Mod assist   General bed mobility comments: VC's for technique and mod assist +2 to manage B LE and trunk.   Transfers Overall transfer level: Needs assistance Equipment used: Rolling walker (2 wheeled) Transfers: Sit to/from Stand Sit to Stand: Min assist;Mod assist;Max assist;+2 physical assistance         General transfer comment: frequent cues for proper hand and foot placement when performing sit to stand transfers. At beginning of treatment pt min assist + 2 for safety for transfers by the end of treatment pt modA-maxA+3. Frequent cueing to stand up right during pericare  at end of session;ModA-MaxA+2   Ambulation/Gait Ambulation/Gait assistance: Min assist;Mod assist;+2 safety/equipment(3rd for chair follow) Ambulation Distance (Feet): 80 Feet Assistive device: Rolling walker (2 wheeled) Gait Pattern/deviations: Step-through pattern;Decreased step length - right;Decreased step length - left;Decreased stride length;Drifts right/left;Trunk flexed;Shuffle     General Gait Details: Pt unsteady with gait. minA +2 LOB x2 VC for increased step length and to look straight ahead while ambulating. Without warning pt began to sit down for rest breaks x3. Educated pt on importance of safety to prevent falls. Encouragement needed to keep walking after every rest break. Pt impulsively increased speed to get back to room. VC for slower speed. Once at bedside pt decided to sit towards end of bed. modA +3 for sit to stand and side step towards HOB.    Stairs            Wheelchair Mobility    Modified Rankin (Stroke Patients Only)       Balance Overall balance assessment: Needs assistance Sitting-balance support: No upper extremity supported;Feet supported Sitting balance-Leahy Scale: Good     Standing balance support: Bilateral upper extremity supported Standing balance-Leahy Scale: Poor Standing balance comment: Reliant on UE support and external assistance.                             Cognition Arousal/Alertness: Awake/alert Behavior During Therapy: Impulsive;Agitated Overall Cognitive Status: Impaired/Different from baseline Area of Impairment: Attention;Memory;Following commands;Safety/judgement;Awareness;Problem solving  Current Attention Level: Selective Memory: Decreased short-term memory Following Commands: Follows one step commands inconsistently Safety/Judgement: Decreased awareness of safety Awareness: Intellectual Problem Solving: Slow processing;Decreased initiation;Difficulty sequencing;Requires verbal  cues;Requires tactile cues General Comments: Pt impulsively sitting during ambulation despite multiple repetitions of education concerning safety. Pt slow to respond to education. Decreased ideational praxis noted as pt presented with toilet paper and needing assistance to problem solve the purpose for its use.       Exercises      General Comments General comments (skin integrity, edema, etc.): VSS on RA throughout session.       Pertinent Vitals/Pain Pain Assessment: Faces Pain Score: 8  Faces Pain Scale: Hurts little more Pain Location: legs, sacrum with pericare Pain Descriptors / Indicators: Tender Pain Intervention(s): Monitored during session;Repositioned    Home Living Family/patient expects to be discharged to:: Private residence Living Arrangements: Spouse/significant other(Billy) Available Help at Discharge: Friend(s);Available 24 hours/day Type of Home: House Home Access: Stairs to enter Entrance Stairs-Rails: Right Home Layout: One level Home Equipment: Walker - 2 wheels;Cane - quad;Cane - single point;Shower seat;Grab bars - tub/shower;Transport chair Additional Comments: Lives with her friend Abe People who is visually impaired.    Prior Function Level of Independence: Independent          PT Goals (current goals can now be found in the care plan section) Acute Rehab PT Goals Patient Stated Goal: to be warmer Progress towards PT goals: Not progressing toward goals - comment    Frequency    Min 3X/week      PT Plan Discharge plan needs to be updated    Co-evaluation PT/OT/SLP Co-Evaluation/Treatment: Yes Reason for Co-Treatment: For patient/therapist safety PT goals addressed during session: Mobility/safety with mobility;Balance;Proper use of DME OT goals addressed during session: ADL's and self-care      AM-PAC PT "6 Clicks" Daily Activity  Outcome Measure  Difficulty turning over in bed (including adjusting bedclothes, sheets and blankets)?:  None Difficulty moving from lying on back to sitting on the side of the bed? : A Lot Difficulty sitting down on and standing up from a chair with arms (e.g., wheelchair, bedside commode, etc,.)?: A Lot Help needed moving to and from a bed to chair (including a wheelchair)?: A Little Help needed walking in hospital room?: A Lot Help needed climbing 3-5 steps with a railing? : A Lot 6 Click Score: 15    End of Session Equipment Utilized During Treatment: Gait belt Activity Tolerance: Patient limited by fatigue Patient left: in bed;with call bell/phone within reach;with bed alarm set;with family/visitor present;with SCD's reapplied Nurse Communication: Mobility status PT Visit Diagnosis: Unsteadiness on feet (R26.81);Muscle weakness (generalized) (M62.81);Other abnormalities of gait and mobility (R26.89)     Time: 1500-1530 PT Time Calculation (min) (ACUTE ONLY): 30 min  Charges:  $Gait Training: 8-22 mins                    G Codes:       Fransisca Connors, SPTA    Fransisca Connors 12/05/2016, 5:18 PM

## 2016-12-05 NOTE — Evaluation (Signed)
Occupational Therapy Evaluation Patient Details Name: Christine Santos MRN: [id number] DOB: Sep 24, 1945 Today's Date: 12/05/2016    History of Present Illness Pt adm with PNA and lt empyema. Pt to underwent VATS 11/6 PM. PMH - CLL, CHF, CKD Stage V, Afib, CVA, CAD   Clinical Impression   PTA, pt was independent with ADL and functional mobility. She lives with her significant other. Pt currently requires fluctuating assistance for toilet transfers from min-max assist +2. Pt very impulsive and will sit without warning despite multiple instances of education concerning safety. Pt additionally requiring max assist for LB ADL, min assist for UB ADL, and total assist +2-3 for pericare. She presents with decreased awareness, decreased attention, and poor problem solving abilities impacting her safety with ADL. Pt would best benefit from SNF level rehabilitation post-acute D/C to maximize safety and return to independence. OT will continue to follow while admitted.      Follow Up Recommendations  SNF;Supervision/Assistance - 24 hour    Equipment Recommendations  3 in 1 bedside commode    Recommendations for Other Services       Precautions / Restrictions Precautions Precautions: Fall Restrictions Weight Bearing Restrictions: No      Mobility Bed Mobility Overal bed mobility: Needs Assistance Bed Mobility: Sit to Supine       Sit to supine: +2 for physical assistance;Mod assist   General bed mobility comments: VC's for technique and mod assist +2 to manage B LE and trunk.   Transfers Overall transfer level: Needs assistance Equipment used: Rolling walker (2 wheeled) Transfers: Sit to/from Stand Sit to Stand: Min assist;Mod assist;Max assist;+2 physical assistance         General transfer comment: Frequent cues for proper hand and foot placement when performing sit to stand transfers. At beginning of treatment pt min assist + 2 for safety for transfers. However, requiring  increased assist at end of session up to max assist +2. Difficulty (and question willingness) to maintain upright position during pericare at end of session requiring third person assist.    Balance Overall balance assessment: Needs assistance Sitting-balance support: No upper extremity supported;Feet supported Sitting balance-Leahy Scale: Good     Standing balance support: Bilateral upper extremity supported Standing balance-Leahy Scale: Poor Standing balance comment: Reliant on UE support and external assistance.                            ADL either performed or assessed with clinical judgement   ADL Overall ADL's : Needs assistance/impaired Eating/Feeding: Supervision/ safety;Sitting   Grooming: Supervision/safety;Sitting   Upper Body Bathing: Minimal assistance;Sitting   Lower Body Bathing: Maximal assistance;Sit to/from stand   Upper Body Dressing : Minimal assistance;Sitting   Lower Body Dressing: Maximal assistance;Sit to/from stand   Toilet Transfer: Moderate assistance;+2 for physical assistance;Ambulation;Minimal assistance;Maximal assistance;RW Toilet Transfer Details (indicate cue type and reason): Fluctuating assistance required.  Toileting- Clothing Manipulation and Hygiene: Maximal assistance;Total assistance;Sit to/from stand;+2 for physical assistance Toileting - Clothing Manipulation Details (indicate cue type and reason): Max assist initially. On second attempt, pt attempting to sit and unable to maintain standing position.      Functional mobility during ADLs: Moderate assistance;Minimal assistance;Maximal assistance;Rolling walker;+2 for physical assistance General ADL Comments: Initially min assist +2 to power up and maintain standing position. However, pt highly impulsive and will sit without warning. Additionally required increasing assistance with each sit<>stand and eventually requiring max assist +2/3.      Vision  Additional Comments:  Pt with decreased blinking and frequently staring off into space. Need to further assess.      Perception     Praxis Praxis Praxis tested?: Deficits Deficits: Ideation Praxis-Other Comments: Required cues to utilize toilet paper properly when presented to her.     Pertinent Vitals/Pain Pain Assessment: Faces Pain Score: 8  Faces Pain Scale: Hurts little more Pain Location: legs, sacrum with pericare Pain Descriptors / Indicators: Tender Pain Intervention(s): Monitored during session;Repositioned     Hand Dominance     Extremity/Trunk Assessment Upper Extremity Assessment Upper Extremity Assessment: Generalized weakness   Lower Extremity Assessment Lower Extremity Assessment: Generalized weakness       Communication Communication Communication: No difficulties   Cognition Arousal/Alertness: Awake/alert Behavior During Therapy: Impulsive;Agitated Overall Cognitive Status: Impaired/Different from baseline Area of Impairment: Attention;Memory;Following commands;Safety/judgement;Awareness;Problem solving                   Current Attention Level: Selective Memory: Decreased short-term memory Following Commands: Follows one step commands inconsistently Safety/Judgement: Decreased awareness of safety Awareness: Intellectual Problem Solving: Slow processing;Decreased initiation;Difficulty sequencing;Requires verbal cues;Requires tactile cues General Comments: Pt impulsively sitting during ambulation despite multiple repetitions of education concerning safety. Pt slow to respond to education. Decreased ideational praxis noted as pt presented with toilet paper and needing assistance to problem solve the purpose for its use.    General Comments  VSS on RA throughout session.     Exercises     Shoulder Instructions      Home Living Family/patient expects to be discharged to:: Private residence Living Arrangements: Spouse/significant other(Billy) Available Help at  Discharge: Friend(s);Available 24 hours/day Type of Home: House Home Access: Stairs to enter [employer] of Steps: 2 Entrance Stairs-Rails: Right Home Layout: One level     Bathroom Shower/Tub: Tub/shower unit         Home Equipment: Environmental consultant - 2 wheels;Cane - quad;Cane - single point;Shower seat;Grab bars - tub/shower;Transport chair   Additional Comments: Lives with her friend Abe People who is visually impaired.      Prior Functioning/Environment Level of Independence: Independent                 OT Problem List: Decreased strength;Decreased activity tolerance;Impaired balance (sitting and/or standing);Impaired vision/perception;Decreased cognition;Decreased safety awareness;Decreased knowledge of use of DME or AE;Decreased knowledge of precautions;Pain      OT Treatment/Interventions: Self-care/ADL training;Therapeutic exercise;Energy conservation;DME and/or AE instruction;Therapeutic activities;Patient/family education;Balance training    OT Goals(Current goals can be found in the care plan section) Acute Rehab OT Goals Patient Stated Goal: to be warmer OT Goal Formulation: With patient/family Time For Goal Achievement: 12/19/16 Potential to Achieve Goals: Fair ADL Goals Pt Will Perform Grooming: with modified independence;sitting Pt Will Perform Upper Body Dressing: with modified independence;sitting Pt Will Perform Lower Body Dressing: with min assist;sit to/from stand Pt Will Transfer to Toilet: with min assist;ambulating;bedside commode(BSC over toilet) Pt Will Perform Toileting - Clothing Manipulation and hygiene: with min assist;sit to/from stand Additional ADL Goal #1: Pt will demonstrate emergent awareness during toileting tasks. Additional ADL Goal #2: Pt will demonstrate improved safety awareness by independently requesting a chair be placed behind her prior to sitting for rest break.  OT Frequency: Min 2X/week   Barriers to D/C:             Co-evaluation PT/OT/SLP Co-Evaluation/Treatment: Yes Reason for Co-Treatment: For patient/therapist safety PT goals addressed during session: Mobility/safety with mobility;Balance;Proper use of DME OT goals addressed during session: ADL's and self-care  AM-PAC PT "6 Clicks" Daily Activity     Outcome Measure Help from another person eating meals?: A Little Help from another person taking care of personal grooming?: A Little Help from another person toileting, which includes using toliet, bedpan, or urinal?: A Lot Help from another person bathing (including washing, rinsing, drying)?: A Lot Help from another person to put on and taking off regular upper body clothing?: A Little Help from another person to put on and taking off regular lower body clothing?: A Lot 6 Click Score: 15   End of Session Equipment Utilized During Treatment: Gait belt;Rolling walker Nurse Communication: Mobility status(fluctuating assistance)  Activity Tolerance: Patient tolerated treatment well Patient left: in bed;with call bell/phone within reach;with bed alarm set;with family/visitor present  OT Visit Diagnosis: Other abnormalities of gait and mobility (R26.89);Muscle weakness (generalized) (M62.81);Other symptoms and signs involving cognitive function                Time: 1451-1525 OT Time Calculation (min): 34 min Charges:  OT General Charges $OT Visit: 1 Visit OT Evaluation $OT Eval Moderate Complexity: 1 Mod G-Codes:     Norman Herrlich, MS OTR/L  Pager: [place] A [place] 12/05/2016, 4:40 PM

## 2016-12-05 NOTE — Progress Notes (Signed)
Inpatient Diabetes Program Recommendations  AACE/ADA: New Consensus Statement on Inpatient Glycemic Control (2015)  Target Ranges:  Prepandial:   less than 140 mg/dL      Peak postprandial:   less than 180 mg/dL (1-2 hours)      Critically ill patients:  140 - 180 mg/dL   Lab Results  Component Value Date   GLUCAP 185 (H) 12/05/2016   HGBA1C 6.0 07/06/2016    Review of Glycemic ControlResults for JULIAUNA, STUEVE (MRN [id number]) as of 12/05/2016 10:15  Ref. Range 12/04/2016 08:02 12/04/2016 12:17 12/04/2016 16:49 12/04/2016 21:08 12/05/2016 08:17  Glucose-Capillary Latest Ref Range: 65 - 99 mg/dL 161 (H) 203 (H) 153 (H) 192 (H) 185 (H)    Diabetes history: Type 2 DM Outpatient Diabetes medications: Lantus 37 units daily,  Humalog 5 units tid with meals Current orders for Inpatient glycemic control:  Novolog sensitive tid with meals  Inpatient Diabetes Program Recommendations:   Please consider adding Lantus 16 units daily while patient is in the hospital.   Thanks, Adah Perl, RN, BC-ADM Inpatient Diabetes Coordinator Pager [phone] (8a-5p)

## 2016-12-05 NOTE — Progress Notes (Signed)
PULMONARY / CRITICAL CARE MEDICINE   Name: Christine Santos MRN: [id number] DOB: March 16, 1945    ADMISSION DATE:  11/25/2016  HISTORY OF PRESENT ILLNESS:   60yoF with hx CLL, CHF, CKD Stage V, Afib (on eliquis), CVA, CAD, who was admitted 11/25/16 after 2 days of SOB, Productive cough, and small volume hemoptysis at home. She was treated with Levoflox 11/2-11/3 . She was on Eliquis (last dose 11/3 AM) which was held. Chest CT showed diffuse dense infiltrates within left lung with loculated appearing moderate sized left pleural effusion.  Underwent VATs 11/6 for left empyema  Significant events reviewed 11/5 thora >> 50 cc cloudy fluid 11/6 VATS decortication  Subjective In bed No fevers anaerobic organism isolated  VITAL SIGNS: BP (!) 155/71   Pulse 84   Temp 98.1 F (36.7 C) (Oral)   Resp 11   Ht 5' 5.5" (1.664 m)   Wt 82.3 kg (181 lb 7 oz)   SpO2 94%   BMI 29.73 kg/m   INTAKE / OUTPUT: I/O last 3 completed shifts: In: 1971.8 [P.O.:420; I.V.:1551.8] Out: 900 [Urine:500; Stool:400]  PHYSICAL EXAMINATION: General: in bed flat Neuro: nonfocal HEENT: jvd  PULM: left reduced CV: s1 s2 RRR OJ:JKKX, bs wnl, no  Extremities: edema      LABS:  BMET Recent Labs  Lab 12/03/16 0407 12/04/16 0218 12/05/16 0436  NA 135 137 137  K 4.3 4.5 4.1  CL 103 105 106  CO2 20* 19* 19*  BUN 170* 170* 161*  CREATININE 6.14* 5.53* 4.69*  GLUCOSE 220* 160* 182*   Electrolytes Recent Labs  Lab 11/29/16 0230  12/02/16 0355 12/03/16 0407 12/04/16 0218 12/05/16 0436  CALCIUM 8.0*   < > 7.9* 8.2* 8.4* 8.4*  MG 2.0  --  2.2  --   --   --   PHOS  --   --  8.6*  --   --   --    < > = values in this interval not displayed.   CBC Recent Labs  Lab 12/03/16 0407 12/04/16 0218 12/05/16 0436  WBC 12.7* 14.2* 15.1*  HGB 8.1* 8.3* 7.6*  HCT 23.2* 24.5* 22.5*  PLT 274 275 258   Coag's Recent Labs  Lab 11/28/16 2108 11/29/16 0230 11/30/16 1655 12/01/16 0402  APTT 134* 71*  63* 72*  INR 1.33  --   --   --     Sepsis Markers Recent Labs  Lab 11/29/16 0230  PROCALCITON 8.63   ABG Recent Labs  Lab 11/28/16 2338  PHART 7.393  PCO2ART 35.7  PO2ART 64.0*   Liver Enzymes Recent Labs  Lab 11/28/16 2108 12/01/16 0402  AST 52* 42*  ALT 37 32  ALKPHOS 71 81  BILITOT 0.9 0.8  ALBUMIN 2.2* 1.9*   Cardiac Enzymes No results for input(s): TROPONINI, PROBNP in the last 168 hours. Glucose Recent Labs  Lab 12/03/16 2153 12/04/16 0802 12/04/16 1217 12/04/16 1649 12/04/16 2108 12/05/16 0817  GLUCAP 150* 161* 203* 153* 192* 185*   STUDIES:  Chest CT (11/26/16):  1. There is extensive consolidation throughout the left lung which may be secondary to pneumonia in the appropriate clinical setting. There is a complex appearing loculated moderate left pleural effusion.  2. Patchy ground-glass and consolidative opacities scattered throughout the right lung  3. Multiple prominent subcentimeter superior mediastinal lymph nodes are nonspecific however likely reactive in etiology. 4. Cardiomegaly.  Coronary arterial atherosclerosis. 5. Aortic Atherosclerosis (ICD10-I70.0).  CULTURES: Blood cultures (11/3): no growth Urine strep ag POS  Body fluid culture 11/30/2016>>   ANTIBIOTICS: Levofloxacin 11/2>>11/3 Aztreonam 11/4 >>11/9 levaquin 11/4 >>PLAN 3 weeks Flagyl 11/12>>> plan 2 weeks  LINES/TUBES: 11/30/2016 left central venous line>> 11/30/2016 left chest tube>>out  ASSESSMENT / PLAN: 60yoF with hx CLL, CHF, CKD Stage V, Afib (on eliquis), CVA, CAD, who was admitted 11/25/16 with  Dense Left-sided Pneumonia with  empyema  and Acute Hypoxic Respiratory failure   PULMONARY Pneumococcal Pneumonia with empyema s/p VATS 11/6 Acute Hypoxic respiratory failure:  -there has been lack of improving aeration on left, she needs to mobilize better pcxr follow up with clinical declines See ID -IS as able Would kvo, see renal, in 24-48 hours pending  response  Endocrine: CBG (last 3)  Recent Labs    12/04/16 1649 12/04/16 2108 12/05/16 0817  GLUCAP 153* 192* 185*    Type 2 diabetes -Sliding scale insulin, controlled  RENAL Lab Results  Component Value Date   CREATININE 4.69 (H) 12/05/2016   CREATININE 5.53 (H) 12/04/2016   CREATININE 6.14 (H) 12/03/2016   CREATININE 3.4 (HH) 09/06/2016   CREATININE 3.18 (H) 03/30/2016   CREATININE 3.29 (H) 11/25/2015   CREATININE 2.89 (H) 11/19/2015   CREATININE 5.5 (HH) 09/08/2015   CREATININE 3.0 (HH) 03/10/2015   Recent Labs  Lab 12/03/16 0407 12/04/16 0218 12/05/16 0436  K 4.3 4.5 4.1    1. AKI-on-CKD Stage V: ATN Got fluids, crt down Would kvo likely in am  Chem in am   HEMATOLOGIC Recent Labs    12/04/16 0218 12/05/16 0436  HGB 8.3* 7.6*   History of CLL Monitor hemoglobin post surgical intervention  ID Empyema anaerobic organism noted -this organism can be from oral flora and associated with aspiration risk regardless of NO sugn of aspiration will SLP Will add flagyl to levofloxacin ( has significant pcn allergy limiting use of one drug) She will need flagyl 2 weeks on top of 3 weeks of levofloxacin given lack of improved aeration and significant progress This is likely polymicrobial , hence need broad levo still pcxr in am  On flagyl, assess lft Would like to get her to opral therapy this week  Will follow progress with u in am   I udpated pt and family  Lavon Paganini. Titus Mould, MD, [place] Pgr: [facility] 12/05/2016 12:36 PM

## 2016-12-05 NOTE — Plan of Care (Signed)
CRITICAL VALUE ALERT  Critical Value:  Propionibacterium in lung tissue  Date & Time Notied:  12/05/2016 1216  Provider Notified: VanTrigt/Feinstein  Orders Received/Actions taken: none

## 2016-12-05 NOTE — Plan of Care (Signed)
Patient high fall risk d/t prior fall. Yellow armband placed, high fall risk sign up and floor mats placed for pt safety. Patient told to call for any assistance she needed when getting up or moving.

## 2016-12-05 NOTE — Evaluation (Signed)
Clinical/Bedside Swallow Evaluation Patient Details  Name: Christine Santos MRN: [id number] Date of Birth: [birth year]  Today's Date: 12/05/2016 Time: SLP Start Time (ACUTE ONLY): 1400 SLP Stop Time (ACUTE ONLY): 1425 SLP Time Calculation (min) (ACUTE ONLY): 25 min  Past Medical History:  Past Medical History:  Diagnosis Date  . Anemia 08/29/2011  . Anxiety   . Arthritis    "legs" (01/22/2013)  . CAD (coronary artery disease)    s/p CABG in 1999  . Cataract   . Chronic kidney disease   . CLL (chronic lymphoblastic leukemia)   . Diastolic heart failure ([place])   . GERD (gastroesophageal reflux disease)   . H/O hiatal hernia   . Heart murmur   . Hyperlipidemia   . Hypertension   . IDDM (insulin dependent diabetes mellitus) ([place])   . PAD (peripheral artery disease) ([place])   . Paroxysmal atrial fibrillation (HCC)    a. identified on ILR as part of StrokeAF study  . Peripheral vascular disease ([place])   . Stroke (cerebrum) [facility])    Past Surgical History:  Past Surgical History:  Procedure Laterality Date  . ANGIOPLASTY / STENTING FEMORAL Right 09/2010   SFA/notes 11/25/2010 (01/22/2013)  . ANGIOPLASTY / STENTING FEMORAL Left 11/2010   SFA/notes 11/25/2010 (01/22/2013)  . ANGIOPLASTY / STENTING ILIAC     [name] 11/25/2010 (01/22/2013)  . CATARACT EXTRACTION W/ INTRAOCULAR LENS  IMPLANT, BILATERAL Bilateral ?2011  . CHOLECYSTECTOMY  1993  . CORONARY ARTERY BYPASS GRAFT  10/1994   "CABG X3"  . HEEL SPUR EXCISION Bilateral 1970's  . SHOULDER OPEN ROTATOR CUFF REPAIR Bilateral 1990's   "2 times on 1 side; once on the other"  . TRANSLUMINAL ATHERECTOMY FEMORAL ARTERY Right 01/22/2013   & balloon  . TUBAL LIGATION  1980's   HPI:  71 year old female admitted 11/25/16 with acute hypoxic respiratory failure. PMH significant for DM2, CLL s/p chemo, CKD IV, Afob, CABG (1996)   Assessment / Plan / Recommendation Clinical Impression  Pt presents with adequate oral motor strength and function.  No history of swallowing difficulty is reported, and pt exhibits no overt s/s aspiration or decline in respiratory status with any consistency tested at this time. Pt has low risk indicators to raise concern for silent aspiration risk (stroke, GERD), however, if lungs do not clear in a timely fashion, recommend consideration of MBS to objectively assess swallow and rule out silent aspiration. Pt, family, and RN informed of results and recommendations. No further ST intervention recommended at this time. Please reconsult if needed.    SLP Visit Diagnosis: Dysphagia, unspecified (R13.10)    Aspiration Risk  Mild aspiration risk    Diet Recommendation Thin liquid;Regular   Liquid Administration via: Cup;Straw Medication Administration: Whole meds with liquid Supervision: Patient able to self feed Compensations: Minimize environmental distractions;Slow rate;Small sips/bites Postural Changes: Seated upright at 90 degrees    Other  Recommendations Oral Care Recommendations: Oral care BID   Follow up Recommendations   none at this time         Prognosis Prognosis for Safe Diet Advancement: Good      Swallow Study   General Date of Onset: 11/25/16 HPI: 71 year old female admitted 11/25/16 with acute hypoxic respiratory failure. PMH significant for DM2, CLL s/p chemo, CKD IV, Afob, CABG (1996) Type of Study: Bedside Swallow Evaluation Previous Swallow Assessment: none Diet Prior to this Study: Regular;Thin liquids Temperature Spikes Noted: No Respiratory Status: Room air History of Recent Intubation: Yes Length of  Intubations (days): 1 days Date extubated: 11/30/16 Behavior/Cognition: Alert;Cooperative;Pleasant mood Oral Cavity Assessment: Within Functional Limits Oral Care Completed by SLP: No Oral Cavity - Dentition: Dentures, not available(adequate lower dentition) Vision: Functional for self-feeding Self-Feeding Abilities: Able to feed self Patient Positioning: Upright in  chair Baseline Vocal Quality: Normal Volitional Cough: Strong Volitional Swallow: Able to elicit    Oral/Motor/Sensory Function Overall Oral Motor/Sensory Function: Within functional limits   Ice Chips Ice chips: Not tested   Thin Liquid Thin Liquid: Within functional limits Presentation: Straw    Nectar Thick Nectar Thick Liquid: Not tested   Honey Thick Honey Thick Liquid: Not tested   Puree Puree: Within functional limits Presentation: Self Fed;Spoon   Solid   GO   Solid: Within functional limits Presentation: [place] Neighbors B. Quentin Ore, [facility], [place] Speech Language Pathologist [phone]  Shonna Chock 12/05/2016,2:33 PM

## 2016-12-06 ENCOUNTER — Inpatient Hospital Stay (HOSPITAL_COMMUNITY): Payer: Medicare Other

## 2016-12-06 LAB — CBC
HEMATOCRIT: 22.7 % — AB (ref 36.0–46.0)
Hemoglobin: 7.7 g/dL — ABNORMAL LOW (ref 12.0–15.0)
MCH: 31.6 pg (ref 26.0–34.0)
MCHC: 33.9 g/dL (ref 30.0–36.0)
MCV: 93 fL (ref 78.0–100.0)
Platelets: 264 10*3/uL (ref 150–400)
RBC: 2.44 MIL/uL — ABNORMAL LOW (ref 3.87–5.11)
RDW: 15.6 % — AB (ref 11.5–15.5)
WBC: 14.9 10*3/uL — AB (ref 4.0–10.5)

## 2016-12-06 LAB — GLUCOSE, CAPILLARY
GLUCOSE-CAPILLARY: 270 mg/dL — AB (ref 65–99)
GLUCOSE-CAPILLARY: 208 mg/dL — AB (ref 65–99)
GLUCOSE-CAPILLARY: 193 mg/dL — AB (ref 65–99)
Glucose-Capillary: 256 mg/dL — ABNORMAL HIGH (ref 65–99)

## 2016-12-06 LAB — COMPREHENSIVE METABOLIC PANEL
ALBUMIN: 1.9 g/dL — AB (ref 3.5–5.0)
ALK PHOS: 102 U/L (ref 38–126)
ALT: 28 U/L (ref 14–54)
ANION GAP: 10 (ref 5–15)
AST: 28 U/L (ref 15–41)
BILIRUBIN TOTAL: 0.7 mg/dL (ref 0.3–1.2)
BUN: 150 mg/dL — ABNORMAL HIGH (ref 6–20)
CALCIUM: 8.1 mg/dL — AB (ref 8.9–10.3)
CO2: 18 mmol/L — ABNORMAL LOW (ref 22–32)
Chloride: 108 mmol/L (ref 101–111)
Creatinine, Ser: 3.99 mg/dL — ABNORMAL HIGH (ref 0.44–1.00)
GFR, EST AFRICAN AMERICAN: 12 mL/min — AB (ref >60)
GFR, EST NON AFRICAN AMERICAN: 10 mL/min — AB (ref >60)
GLUCOSE: 288 mg/dL — AB (ref 65–99)
POTASSIUM: 4.2 mmol/L (ref 3.5–5.1)
Sodium: 136 mmol/L (ref 135–145)
TOTAL PROTEIN: 4.5 g/dL — AB (ref 6.5–8.1)

## 2016-12-06 LAB — FERRITIN: Ferritin: 1166 ng/mL — ABNORMAL HIGH (ref 11–307)

## 2016-12-06 LAB — IRON AND TIBC
Iron: 33 ug/dL (ref 28–170)
Saturation Ratios: 14 % (ref 10.4–31.8)
TIBC: 237 ug/dL — AB (ref 250–450)
UIBC: 204 ug/dL

## 2016-12-06 MED ORDER — MELATONIN 3 MG PO TABS
9.0000 mg | ORAL_TABLET | Freq: Every evening | ORAL | Status: DC | PRN
  Administered 2016-12-06: 9 mg via ORAL
  Filled 2016-12-06 (×2): qty 3

## 2016-12-06 MED ORDER — INSULIN GLARGINE 100 UNIT/ML ~~LOC~~ SOLN
16.0000 [IU] | Freq: Every day | SUBCUTANEOUS | Status: DC
  Administered 2016-12-06: 16 [IU] via SUBCUTANEOUS
  Filled 2016-12-06 (×3): qty 0.16

## 2016-12-06 MED ORDER — METRONIDAZOLE 500 MG PO TABS
500.0000 mg | ORAL_TABLET | Freq: Three times a day (TID) | ORAL | Status: DC
  Administered 2016-12-06 – 2016-12-07 (×4): 500 mg via ORAL
  Filled 2016-12-06 (×4): qty 1

## 2016-12-06 MED ORDER — NON FORMULARY: 10.0000 mg | Freq: Every evening | Status: DC | PRN

## 2016-12-06 MED ORDER — INSULIN GLARGINE 100 UNIT/ML ~~LOC~~ SOLN: 10.0000 [IU] | Freq: Every day | SUBCUTANEOUS | Status: DC

## 2016-12-06 MED ORDER — BOOST / RESOURCE BREEZE PO LIQD
1.0000 | Freq: Three times a day (TID) | ORAL | Status: DC
  Administered 2016-12-06 – 2016-12-07 (×3): 1 via ORAL

## 2016-12-06 MED ORDER — RISAQUAD PO CAPS
2.0000 | ORAL_CAPSULE | Freq: Every day | ORAL | Status: DC
  Administered 2016-12-06 – 2016-12-07 (×2): 2 via ORAL
  Filled 2016-12-06 (×2): qty 2

## 2016-12-06 NOTE — Progress Notes (Addendum)
PULMONARY / CRITICAL CARE MEDICINE   Name: Christine Santos MRN: [id number] DOB: Aug 23, 1945    ADMISSION DATE:  11/25/2016  HISTORY OF PRESENT ILLNESS:   28yoF with hx CLL, CHF, CKD Stage V, Afib (on eliquis), CVA, CAD, who was admitted 11/25/16 after 2 days of SOB, Productive cough, and small volume hemoptysis at home. She was treated with Levoflox 11/2-11/3 . She was on Eliquis (last dose 11/3 AM) which was held. Chest CT showed diffuse dense infiltrates within left lung with loculated appearing moderate sized left pleural effusion.  Underwent VATs 11/6 for left empyema  Significant events reviewed 11/5 thora >> 50 cc cloudy fluid 11/6 VATS decortication  Subjective States she feels better today Walking with 2 assist around unit 11/14 Afebrile anaerobic organism isolated from L pleural fluid: RARE PROPIONIBACTERIUM ACNES   VITAL SIGNS: BP (!) 147/74   Pulse 82   Temp 98.6 F (37 C) (Oral)   Resp (!) 22   Ht 5\' 5"  (1.651 m)   Wt 184 lb 4.9 oz (83.6 kg)   SpO2 96%   BMI 30.67 kg/m   INTAKE / OUTPUT: I/O last 3 completed shifts: In: 6314 [P.O.:660; I.V.:2625; IV Piggyback:250] Out: 970 [Urine:453; Stool:1]  PHYSICAL EXAMINATION: General: Supine in bed ater walking around unit with assist Neuro: Awake and alert, MAE x 4, appropriate HEENT: + JVD, normocephalic, atraumatic PULM: Diminished per left , no accessory muscle use no nasal flaring CV: s1 s2 irreg regular, atrial fib per monitor, no RMG GI: Non-distended, non-tender to palpation, BS + Extremities: 2+ BLE edema, otherwise no obvious abnormalities  Skin:  Frail , thin , Warm, dry and intact, no rash or lesions noted, some bruising      LABS:  BMET Recent Labs  Lab 12/04/16 0218 12/05/16 0436 12/06/16 0300  NA 137 137 136  K 4.5 4.1 4.2  CL 105 106 108  CO2 19* 19* 18*  BUN 170* 161* 150*  CREATININE 5.53* 4.69* 3.99*  GLUCOSE 160* 182* 288*   Electrolytes Recent Labs  Lab 12/02/16 0355   12/04/16 0218 12/05/16 0436 12/06/16 0300  CALCIUM 7.9*   < > 8.4* 8.4* 8.1*  MG 2.2  --   --   --   --   PHOS 8.6*  --   --   --   --    < > = values in this interval not displayed.   CBC Recent Labs  Lab 12/04/16 0218 12/05/16 0436 12/05/16 1438 12/06/16 0300  WBC 14.2* 15.1*  --  14.9*  HGB 8.3* 7.6* 8.1* 7.7*  HCT 24.5* 22.5* 24.1* 22.7*  PLT 275 258  --  264   Coag's Recent Labs  Lab 11/30/16 1655 12/01/16 0402  APTT 63* 72*    Sepsis Markers No results for input(s): LATICACIDVEN, PROCALCITON, O2SATVEN in the last 168 hours. ABG No results for input(s): PHART, PCO2ART, PO2ART in the last 168 hours. Liver Enzymes Recent Labs  Lab 12/01/16 0402 12/06/16 0300  AST 42* 28  ALT 32 28  ALKPHOS 81 102  BILITOT 0.8 0.7  ALBUMIN 1.9* 1.9*   Cardiac Enzymes No results for input(s): TROPONINI, PROBNP in the last 168 hours. Glucose Recent Labs  Lab 12/04/16 1649 12/04/16 2108 12/05/16 0817 12/05/16 1234 12/05/16 1745 12/06/16 0806  GLUCAP 153* 192* 185* 248* 267* 270*   STUDIES:  Chest CT (11/26/16):  1. There is extensive consolidation throughout the left lung which may be secondary to pneumonia in the appropriate clinical setting. There is  a complex appearing loculated moderate left pleural effusion.  2. Patchy ground-glass and consolidative opacities scattered throughout the right lung  3. Multiple prominent subcentimeter superior mediastinal lymph nodes are nonspecific however likely reactive in etiology. 4. Cardiomegaly.  Coronary arterial atherosclerosis. 5. Aortic Atherosclerosis (ICD10-I70.0).  CULTURES: Blood cultures (11/3): no growth Urine strep ag POS Body fluid culture 11/30/2016>>   ANTIBIOTICS: Levofloxacin 11/2>>11/3 Aztreonam 11/4 >>11/9 levaquin 11/4 >>PLAN 3 weeks Flagyl 11/12>>> plan 2 weeks  LINES/TUBES: 11/30/2016 left central venous line>> 11/30/2016 left chest tube>>out  ASSESSMENT / PLAN: 44yoF with hx CLL, CHF, CKD Stage  V, Afib (on eliquis), CVA, CAD, who was admitted 11/25/16 with  Dense Left-sided Pneumonia with  empyema  and Acute Hypoxic Respiratory failure   PULMONARY Pneumococcal Pneumonia with empyema s/p VATS 11/6 Acute Hypoxic respiratory failure:  On RA 11/14 am with sats of > 93% -there has been lack of improving aeration on left, she needs to mobilize better - pcxr follow up with clinical declines - saturation goal is > 92% - See ID - IS as able/ Flutter valve - Aggressive Pulmonary Toilet - OOB to chair - Walk/ mobilize - DC IVF per renal recommendations  Endocrine: CBG (last 3)  Recent Labs    12/05/16 1234 12/05/16 1745 12/06/16 0806  GLUCAP 248* 267* 270*    Type 2 diabetes -Sliding scale insulin, controlled  RENAL Lab Results  Component Value Date   CREATININE 3.99 (H) 12/06/2016   CREATININE 4.69 (H) 12/05/2016   CREATININE 5.53 (H) 12/04/2016   CREATININE 3.4 (HH) 09/06/2016   CREATININE 3.18 (H) 03/30/2016   CREATININE 3.29 (H) 11/25/2015   CREATININE 2.89 (H) 11/19/2015   CREATININE 5.5 (HH) 09/08/2015   CREATININE 3.0 (HH) 03/10/2015   Recent Labs  Lab 12/04/16 0218 12/05/16 0436 12/06/16 0300  K 4.5 4.1 4.2    1. AKI-on-CKD Stage V: ATN - Creatinine trending down - BUN trending down - DC IVF per renal suggestion - Trend BMET - Monitor urine output - Maintain MAP > 65 - Avoid nephrotoxic medications  HEMATOLOGIC Recent Labs    12/05/16 1438 12/06/16 0300  HGB 8.1* 7.7*   History of CLL Monitor hemoglobin post surgical intervention Monitor for fever/ night sweats  ID Empyema anaerobic organism noted -this organism can be from oral flora and associated with aspiration risk regardless of NO sign of aspiration will SLP Will add flagyl to levofloxacin ( has significant pcn allergy limiting use of one drug) She will need flagyl 2 weeks on top of 3 weeks of levofloxacin given lack of improved aeration and significant progress. This is likely  polymicrobial , hence need broad levo still pcxr 11/14 as continued slow resolution/ improvement per imaging  While On flagyl, monitor/ trend  Liver functions Goal to transition to oral therapy once she is tolerating food better.  ( Currently refusing to wear her upper dentures which is impacting intake) She will need long term ABX therapy as outlined by Dr. Titus Mould above Updated husband and patient 11/14  PCCM will sign off. Please re-consult if patient has a decline or if we can be of any further assistance.    Magdalen Spatz, AGACNP-BC. [facility] Pgr: [phone] [facility] 12/06/2016 10:31 AM  STAFF NOTE: Linwood Dibbles, MD FACP have personally reviewed patient's available data, including medical history, events of note, physical examination and test results as part of my evaluation. I have discussed with resident/NP and other care providers such as pharmacist,  RN and RRT. In addition, I personally evaluated patient and elicited key findings of: walking the ICU, appears stronger, coarse reduced left chest, rt clear, jvd wnl, tolerated pos balance and crt is down again, LFt wnl, pcxr I reviewed shows left infiltrate with some atx likely on top, can consider to oral flagyl, LFT wnl, 2 week coarse, levofloxacin for total duration abx 3 weeks, we isolated anerob but likely polymicrobial, SLP done with this organism, low threshold CT chest repeat left with any declines, continued aggressive mobility, can transfer out of icu, call if needed, to oral abx, updated family, glu remain elevated, lantus increase to 16, ssi , follow trends   Lavon Paganini. Titus Mould, MD, [place] Pgr: [facility] 12/06/2016 11:24 AM

## 2016-12-06 NOTE — Progress Notes (Signed)
CSW provided SNF bed offers to patient for review- no choice at this time  Pt continues to prefer returning home if able and feels as if she is improving with mobility  CSW will continue to follow  Jorge Ny, [place] Social Worker [phone]

## 2016-12-06 NOTE — Progress Notes (Signed)
Subjective: Patient denies complaints today. Off oxygen. Eating and drinking without issue.   Objective: Vital signs in last 24 hours: Temp:  [97.6 F (36.4 C)-98.6 F (37 C)] 98.6 F (37 C) (11/13 0804) Pulse Rate:  [71-107] 106 (11/13 0804) Resp:  [11-28] 18 (11/13 0804) BP: (119-176)/(39-153) 176/78 (11/13 0804) SpO2:  [93 %-100 %] 96 % (11/13 0804) Weight:  [184 lb 4.9 oz (83.6 kg)] 184 lb 4.9 oz (83.6 kg) (11/13 0600) Weight change:   Intake/Output from previous day: 11/12 0701 - 11/13 0700 In: 2635 [P.O.:660; I.V.:1725; IV Piggyback:250] Out: 671 [Urine:453; Stool:1] Intake/Output this shift: Total I/O In: 100 [IV Piggyback:100] Out: -   General appearance: alert, cooperative, no distress and sitting up in chair eating breakfast Resp: clear to auscultation bilaterally Cardio: irregularly irregular rhythm GI: soft, non-tender, non-distended Extremities: edema +1 bl Neurologic: Grossly normal  Lab Results: Recent Labs    12/05/16 0436 12/05/16 1438 12/06/16 0300  WBC 15.1*  --  14.9*  HGB 7.6* 8.1* 7.7*  HCT 22.5* 24.1* 22.7*  PLT 258  --  264   BMET:  Recent Labs    12/05/16 0436 12/06/16 0300  NA 137 136  K 4.1 4.2  CL 106 108  CO2 19* 18*  GLUCOSE 182* 288*  BUN 161* 150*  CREATININE 4.69* 3.99*  CALCIUM 8.4* 8.1*   No results for input(s): PTH in the last 72 hours. Iron Studies: No results for input(s): IRON, TIBC, TRANSFERRIN, FERRITIN in the last 72 hours.  Studies/Results: Dg Chest Port 1 View  Result Date: 12/06/2016 CLINICAL DATA:  Cough EXAM: PORTABLE CHEST 1 VIEW COMPARISON:  12/05/2016 FINDINGS: Cardiac shadow is enlarged but stable. Postsurgical changes are again seen. A left jugular central line is again noted and stable although kinking is noted at the skin surface in the neck. Persistent loculated effusion is noted laterally on the left. Some underlying consolidation remains. No acute bony abnormality is seen. Right basilar  atelectatic changes are seen. IMPRESSION: Overall stable appearance of the chest. Kinking of the left jugular central line at the skin surface. Electronically Signed   By: Inez Catalina M.D.   On: 12/06/2016 07:46   Dg Chest Port 1 View  Result Date: 12/05/2016 CLINICAL DATA:  Pleural effusion and shortness of breath EXAM: PORTABLE CHEST 1 VIEW COMPARISON:  December 04, 2016 FINDINGS: Central catheter tip is at the junction of the left innominate vein and superior vena cava, stable. No pneumothorax. There is loculated pleural effusion on the left with consolidation in the left lower lobe region. There has been partial clearing of patchy opacity from the medial right base. Right lung is clear except for mild medial right base atelectasis. Heart remains prominent with pulmonary vascularity within normal limits. There is aortic atherosclerosis. Postoperative change noted in left shoulder. Patient is status post coronary artery bypass grafting. IMPRESSION: Persistent loculated effusion on the left with airspace consolidation left lower lobe. Mild atelectasis medial right base. Right lung otherwise clear. There has been partial clearing of consolidation from the medial right base. Cardiac prominence is stable. No pneumothorax. No new opacity evident. There is aortic atherosclerosis. Aortic Atherosclerosis (ICD10-I70.0). Electronically Signed   By: Lowella Grip III M.D.   On: 12/05/2016 07:27    Scheduled: . acidophilus  2 capsule Oral Daily  . amLODipine  5 mg Oral Daily  . apixaban  2.5 mg Oral BID  . atorvastatin  80 mg Oral q1800  . benzonatate  100 mg Oral TID  .  bisacodyl  10 mg Oral Daily  . Chlorhexidine Gluconate Cloth  6 each Topical Daily  . dextromethorphan-guaiFENesin  1 tablet Oral BID  . feeding supplement (GLUCERNA SHAKE)  237 mL Oral BID BM  . insulin aspart  0-9 Units Subcutaneous TID WC  . insulin glargine  10 Units Subcutaneous QHS  . levofloxacin  500 mg Oral Q48H  .  metoprolol tartrate  25 mg Oral BID  . pantoprazole  40 mg Oral Daily  . PARoxetine  20 mg Oral Daily  . senna-docusate  1 tablet Oral QHS  . sodium chloride flush  10-40 mL Intracatheter Q12H    Assessment/Plan: Pt is a70 y.o.yo femalewith a PMHX of CKDIV, CLL s/p chemo, T2DM, HTN, CAD s/p CABG, prior CVA in 2017, afib on eliquis, [facility]with pneumonia and pleural effusion.She has had worsening kidney function during her stay.   1. Acute on chronic kidney disease- Cr continues to improve, BUN has improved as well. Making urine.  -nephrology will sign off at this time and follow up with her as outpatient promptly after discharge -discontinued IV fluid  2. Pneumonia w/ complex loculated left pleural effusion- continue antibiotics, s/p thoracentesis and VATS, pleural fluid w/ no growth  3. Afib w/ RVR- continue metoprolol, eliquis restarted and dose reduced 2/2 high BUN  4. T2DM- per primary  5. HTN- stable -titrate norvasc up if BP continues to be elevated  Dispo: pending clinical improvement, likely SNF   Nephrology will sign off at this time. Please re-consult if need arises.   LOS: 11 days   Steve Rattler 12/06/2016,8:32 AM

## 2016-12-06 NOTE — Consult Note (Addendum)
[place] Nurse wound consult note Reason for Consult: Consult requested for sacrum. Pt went to the OR on 11/6 and 11/8.  Deep tissue injury was noted yesterday to sacrum/inner gluteal fold. Pressure Injury POA: No Measurement: 6X5cm; sacrum and across bilat upper buttocks Wound bed: darker-colored skin in the center, beginning to peel at the edges, revealing pink moist wound bed. Drainage (amount, consistency, odor)  Periwound: Small amt tan drainage, no odor Dressing procedure/placement/frequency: Pt is on an air mattress to reduce pressure and has a foam dressing in place to protect the location and promote healing.  Discussed plan of care with patient and family members at the bedside that deep tissue pressure injuries are high risk to evolve into full thickness tissue loss within 7-10 days, and they verbalized understanding. Reduce pressure to the affected area as frequently as possible. Please re-consult if further assistance is needed.  Thank-you,  Julien Girt MSN, [place], [place], [place], [place]

## 2016-12-06 NOTE — Plan of Care (Signed)
Report called to 5W RN

## 2016-12-06 NOTE — Plan of Care (Signed)
Transferred to 5W 34 via [place], SCD's with pt, Transferred by [place]

## 2016-12-06 NOTE — Progress Notes (Signed)
Family Medicine Teaching Service Daily Progress Note Intern Pager: [phone]  Patient name: Christine Santos Medical record number: [id number] Date of birth: 10-13-1945 Age: 71 y.o. Gender: female  Primary Care Provider: [name], MD Consultants: Pulmonology, TCTS, Nephrology Code Status: Full   Pt Overview and Major Events to Date:  11/2: Patient admitted for acute hypoxic respiratory failure, treatment started for PNA  11/3: Pulmonology consulted when CT scan chest showed substantial consolidation of left lung with a moderate to large left pleural effusion  11/5 Thoracentesis 11/6 VATS with chest tube placement 11/7 extubated, off pressors   Assessment and Plan: Christine Santos is a 71 y.o. female presenting with cough and dyspnea. PMH is significant for T2DM with peripheral neuropathy, CLL s/p chemotherapy, HTN, CKDVI, CAD s/p CABG, hx CVA in 2017, hx of afib on eliquis.   Pneumonia with Empyema: Respiratory status improved. Satting well on RA. Proprionic bacterium (anerobic sp) found in lung culture. Likely polymicrobial infection. CCM/Pulmonology added Flagyl to antibiotic rePt is more alert this morning. S/p VATS 11/6. Strep Pneumo urine antigen positive. Blood cultures NGx5 days (final). Urine culture NGTD. Wound cultures NGTD. Acid fast smears negative. No fungal growth to date. WBC continues to increase to 14.2 > 15.1. Afebrile. Seems to be tolerating PO antibiotics well. d/cdIV Levaquin 750 mg (11/2-11/4) > (11/5  - 11/11 q48h for renal adustment), azactam (11/5 - 11/10 ). Will require flagyl for 2 weeks on top of 3 weeks levofloxacin given lack of improvement.  - TCTS to remove chest tube sutures at office visit which will be arranged at discharge - Flagyl IV 500 mg q6h (11/12 - ), consider transition to PO 11/13 if tolerating - PO levaquin 500 q48h (11/11 - renal adjusted),  - xopenox for elevated RR, tessalon perles prn - supplemental O2 to maintain O2 sats >/= 92%  - trend  CBC  - TCTS recommendations appreciated - tylenol scheduled - norco 5-325, 1-2 q6h prn   AKI on CKD stage 4. Cr continues to improve to 3.99. Baseline Cr ~3.5. AKI. No longer on IVF. Nephrology has signed off for now - monitor CBC and BMP - avoid nephrotoxic medications - torsemide, consider restarting when Cr back to baseline.   Anemia Hb fluctuating between 7.7-8.1. Transfusion threshold 8.0. Hgb 10.0 on admit. Hg baseline ~10. Anticoagulation 2.5 mg Eliquis BID. Considering transfusion of 1 unit b/c pt is below threshold, but pt has persistant lower extremity edema, poor renal fxn. Concern for possible volume overload. Appears stable otherwise.  - Follow Hg - hold iron due to PNA  H/o diastolic CHF w/ h/o CAD s/p CABG 1996. Weights continues to climb to 184.+ 7L this stay. Hemodynamically stable. Metoprolol 25 mg BID. HR stable <110 last 24hrs. At home taking torsemide daily, alternating doses of 50 mg and 100 mg; metoprolol tartrate 12.5 mg BID; norvasc 10 mg daily; atorvastatin 80 mg daily. Last echo 2017 showed EF of 55-60% with G1DD. Troponin trend: 0.83>0.84>0.67.  - hold torsemide due to AKI.  - Continue metoprolol, norvasc, atorvastatin - I/Os - Daily weights   Afib:  HR < 110. Rate control with metoprolol 25 mg BID. On eliquis at home for anticoagulation for Afib. Restarted elequis yesterday, reduced to 2.5 mg BID, due to poor renal clearance.   - telemetry - eliquis 2.5 mg BID  T2DM CBG 185 - 267 past 24 hrs. Last a1c 6.0 on 07/06/16, at home on lantus 37U daily with 5U humalog tid with meals.  - lantus  10 units qhs - monitor CBGs qACHS - SSI   HTN: Normotensive. At home on norvasc, metoprolol, torsemide. - continue metoprolol and norvasc - hold torsemide  HLD at home on fenofibrate and atorvastatin.last lipid panel 09/04/15. LDL 99. Triglycerides 188. - continue home meds  GERDat home on protonix - continue home med  Goutat home on allopurinol  - continue  home med  Anxiety at home on paxil 20 mg daily. restarted paxil 10 mg.  mood improved and and urine output adequate. Will increase to home dose - cont to monitor mood and urine output  PT/OT - PT/OT recommended SNF.  - Need 3-in-1 commode at discharge  Decubitus ulcer on sacrum Small ulcer on sacrum - wound nurse recommendations  FEN/GI: Carb modified.  Prophylaxis: elequis  Disposition: Inpatient pending respiratory improvement  Subjective:  Alert and sitting in chair. Not on O2. Feels breathing is better. Complains of mild chronic "tail bone pain." No other complaints.   Objective: Temp:  [97.6 F (36.4 C)-98.1 F (36.7 C)] 97.9 F (36.6 C) (11/12 1958) Pulse Rate:  [71-107] 105 (11/13 0600) Resp:  [11-28] 28 (11/13 0600) BP: (119-172)/(39-153) 169/73 (11/13 0600) SpO2:  [93 %-100 %] 96 % (11/13 0600) Weight:  [184 lb 4.9 oz (83.6 kg)] 184 lb 4.9 oz (83.6 kg) (11/13 0600)  Physical Exam: General: NAD, resting alert in chair Cardiovascular: regular rate, regular rhythm. No murmur appreciated.  Respiratory: CTA bilaterally, Slight decreased breath sounds on Left lower field, No increased WOB on 2L O2 [place].  Abdomen: soft, NTND   Laboratory: Recent Labs  Lab 12/04/16 0218 12/05/16 0436 12/05/16 1438 12/06/16 0300  WBC 14.2* 15.1*  --  14.9*  HGB 8.3* 7.6* 8.1* 7.7*  HCT 24.5* 22.5* 24.1* 22.7*  PLT 275 258  --  264   Recent Labs  Lab 12/01/16 0402  12/04/16 0218 12/05/16 0436 12/06/16 0300  NA 131*   < > 137 137 136  K 4.4   < > 4.5 4.1 4.2  CL 98*   < > 105 106 108  CO2 19*   < > 19* 19* 18*  BUN 154*   < > 170* 161* 150*  CREATININE 7.35*   < > 5.53* 4.69* 3.99*  CALCIUM 7.6*   < > 8.4* 8.4* 8.1*  PROT 4.7*  --   --   --  4.5*  BILITOT 0.8  --   --   --  0.7  ALKPHOS 81  --   --   --  102  ALT 32  --   --   --  28  AST 42*  --   --   --  28  GLUCOSE 163*   < > 160* 182* 288*   < > = values in this interval not displayed.     Imaging/Diagnostic  Tests: Dg Chest 2 View  Result Date: 11/25/2016 CLINICAL DATA:  71 y/o  F; chest pain with cough and hemoptysis. EXAM: CHEST  2 VIEW COMPARISON:  04/07/2014 PET-CT.  01/10/2009 chest radiograph. FINDINGS: Mild cardiomegaly. Aortic atherosclerosis with calcification. Status post CABG. Median sternotomy wires are aligned. Extensive consolidations throughout the left mid and lower lung zones. Small to moderate left effusion. Right upper quadrant cholecystectomy clips. Left humerus rotator cuff repair anchor. Multilevel degenerative changes of the spine. IMPRESSION: Extensive consolidation throughout left lung and small to moderate left effusion probably representing pneumonia. Follow-up to resolution is recommended to exclude underlying malignancy. Electronically Signed   By: Edgardo Roys.D.  On: 11/25/2016 15:23   Ct Chest Wo Contrast  Result Date: 11/26/2016 CLINICAL DATA:  Patient with respiratory failure. EXAM: CT CHEST WITHOUT CONTRAST TECHNIQUE: Multidetector CT imaging of the chest was performed following the standard protocol without IV contrast. COMPARISON:  Chest radiograph 11/25/2016. FINDINGS: Cardiovascular: Heart is enlarged. No pericardial effusion. Coronary arterial vascular calcifications. Thoracic aortic vascular calcifications. Stable fluid density structure along the anterior right aspect of the mediastinum (image 75; series 3). Mediastinum/Nodes: Multiple prominent subcentimeter mediastinal lymph nodes are visualized. The esophagus is unremarkable. Abarrent right subclavian vein. Lungs/Pleura: Bowing of the posterior trachea secondary to expiratory phase imaging. There is a a moderate complex left pleural effusion which appears partially loculated. There is extensive consolidation throughout the left lung. Minimal aerated left upper lobe. Patchy ground-glass and consolidative opacities demonstrated throughout the right upper, right middle and right lower lobes. Trace right  pleural fluid. No pneumothorax. Upper Abdomen: Status post cholecystectomy.  No acute process. Musculoskeletal: Thoracic spine degenerative changes. No aggressive or acute appearing osseous lesions. IMPRESSION: 1. There is extensive consolidation throughout the left lung which may be secondary to pneumonia in the appropriate clinical setting. There is a complex appearing loculated moderate left pleural effusion. Recommend radiographic followup to ensure resolution. 2. Patchy ground-glass and consolidative opacities scattered throughout the right lung which may be secondary to an infectious or inflammatory process. Recommend attention on follow-up. 3. Multiple prominent subcentimeter superior mediastinal lymph nodes are nonspecific however likely reactive in etiology. Recommend attention on follow-up. 4. Cardiomegaly.  Coronary arterial atherosclerosis. 5. Aortic Atherosclerosis (ICD10-I70.0). Electronically Signed   By: Lovey Newcomer M.D.   On: 11/26/2016 14:52   Dg Chest Port 1 View  Result Date: 12/05/2016 CLINICAL DATA:  Pleural effusion and shortness of breath EXAM: PORTABLE CHEST 1 VIEW COMPARISON:  December 04, 2016 FINDINGS: Central catheter tip is at the junction of the left innominate vein and superior vena cava, stable. No pneumothorax. There is loculated pleural effusion on the left with consolidation in the left lower lobe region. There has been partial clearing of patchy opacity from the medial right base. Right lung is clear except for mild medial right base atelectasis. Heart remains prominent with pulmonary vascularity within normal limits. There is aortic atherosclerosis. Postoperative change noted in left shoulder. Patient is status post coronary artery bypass grafting. IMPRESSION: Persistent loculated effusion on the left with airspace consolidation left lower lobe. Mild atelectasis medial right base. Right lung otherwise clear. There has been partial clearing of consolidation from the medial  right base. Cardiac prominence is stable. No pneumothorax. No new opacity evident. There is aortic atherosclerosis. Aortic Atherosclerosis (ICD10-I70.0). Electronically Signed   By: Lowella Grip III M.D.   On: 12/05/2016 07:27   Dg Chest Port 1 View  Result Date: 12/04/2016 CLINICAL DATA:  Pleural effusion EXAM: PORTABLE CHEST 1 VIEW COMPARISON:  December 03, 2016 FINDINGS: Left chest tube has been removed. No evident pneumothorax. Central catheter tip is at the junction of the left innominate vein and superior vena cava, stable. There is loculated pleural effusion on the left. There is airspace consolidation in both medial lung bases, more on the left than on the right, stable. No new opacity. Heart is prominent with pulmonary vascularity within normal limits. There is aortic atherosclerosis. No bone lesions evident. There is postoperative change in the left shoulder. IMPRESSION: No pneumothorax. Loculated pleural effusion persists on the left. Airspace consolidation concerning for pneumonia noted in each medial lung base, more on the left than on the  right, stable. No new opacity evident. Stable cardiac silhouette. There is aortic atherosclerosis. Aortic Atherosclerosis (ICD10-I70.0). Electronically Signed   By: Lowella Grip III M.D.   On: 12/04/2016 07:05   Dg Chest Port 1 View  Result Date: 12/03/2016 CLINICAL DATA:  Shortness of breath.  Empyema. EXAM: PORTABLE CHEST 1 VIEW COMPARISON:  December 02, 2016 FINDINGS: More superior chest tube on the left is again noted. More inferior chest tube on the left has been removed. Central catheter tip is at the junction of the left innominate vein and superior vena cava. No pneumothorax. There is consolidation throughout much of the left lung, stable. There is a small left pleural effusion. On the right, there is new patchy airspace consolidation in the medial base region. There is cardiomegaly with pulmonary vascularity within normal limits. Patient is  status post coronary artery bypass grafting. There is aortic atherosclerosis. No adenopathy is demonstrable by radiography. No bone lesions. IMPRESSION: Or inferior chest tube on the left is been removed. No pneumothorax. Extensive airspace consolidation throughout much of the left lung remains. There is new patchy consolidation in the medial right base. There is stable cardiomegaly. There is aortic atherosclerosis. Aortic Atherosclerosis (ICD10-I70.0). Electronically Signed   By: Lowella Grip III M.D.   On: 12/03/2016 07:08   Dg Chest Port 1 View  Result Date: 12/02/2016 CLINICAL DATA:  Chest tube, empyema and shortness-of-breath. EXAM: PORTABLE CHEST 1 VIEW COMPARISON:  12/01/2016 FINDINGS: Sternotomy wires unchanged. Left IJ central venous catheter unchanged with tip obliquely warranted over the SVC. Left basilar chest tube unchanged. Second catheter over the left thorax with tip towards the left apex unchanged. Moderate opacification of the left lung with slight interval worsening. Right lung is clear. Stable cardiomegaly. Remainder the exam is unchanged. IMPRESSION: Interval worsening opacification of the left lung compatible with known pleural fluid collection/empyema and associated atelectasis. Stable cardiomegaly. Tubes and lines as described. Electronically Signed   By: Marin Olp M.D.   On: 12/02/2016 07:26   Dg Chest Port 1 View  Result Date: 12/01/2016 CLINICAL DATA:  Status post VATS for left empyema. EXAM: PORTABLE CHEST 1 VIEW COMPARISON:  11/30/2016 FINDINGS: Two left thoracostomy tubes and left IJ central venous catheter with tip overlying the mid-upper SVC again noted. An endotracheal tube is been removed. Left pleural and parenchymal opacities are unchanged. There is no evidence of pneumothorax. Right basilar atelectasis is again noted. Cardiomegaly and CABG changes again identified. IMPRESSION: Endotracheal tube removal without other significant change. Support apparatus as  described. No evidence of pneumothorax. Electronically Signed   By: Margarette Canada M.D.   On: 12/01/2016 08:47   Dg Chest Port 1 View  Result Date: 11/30/2016 CLINICAL DATA:  Hypoxia EXAM: PORTABLE CHEST 1 VIEW COMPARISON:  November 29, 2016 FINDINGS: Endotracheal tube tip is 2.3 cm above the carina. Central catheter tip is at the junction of the left innominate vein and superior vena cava. There are chest tubes on the left, unchanged in position. No pneumothorax. Areas of consolidation throughout much of the left lung remain. No new opacity evident. No cavitation noted on the left. Right lung appears clear. There is cardiomegaly with pulmonary vascularity within normal limits. No adenopathy. Patient is status post coronary artery bypass grafting. There is postoperative change in the left shoulder. IMPRESSION: Tube and catheter positions without evident pneumothorax. Extensive airspace consolidation on the left is stable. No new opacity on either side. Right lungs clear. Stable cardiac enlargement. Electronically Signed   By: Lowella Grip  III M.D.   On: 11/30/2016 08:50   Dg Chest Port 1 View  Result Date: 11/29/2016 CLINICAL DATA:  Empyema of the lung.  Surgery follow-up. EXAM: PORTABLE CHEST 1 VIEW COMPARISON:  11/29/2016 FINDINGS: An endotracheal tube has been placed with tip measuring 1.9 cm above the carina. A left central venous catheter was placed with tip directed somewhat horizontally in the mid right mediastinum, likely representing location of the confluence of the brachiocephalic and superior vena cava veins. 2 left chest tubes. Postoperative changes in the mediastinum. Shallow inspiration. Cardiac enlargement without vascular congestion. Consolidation in the left mid and lower lung zones is improved since previous study. Probable evacuation of pleural fluid collection. No visible pneumothorax. Surgical clips in the right upper quadrant. Loop recorder. Degenerative changes in the spine and  shoulders. IMPRESSION: Appliances appear in satisfactory position. Shallow inspiration. Cardiac enlargement. Improved left lung consolidation. Interval evacuation of left pleural fluid collection. Electronically Signed   By: Lucienne Capers M.D.   On: 11/29/2016 22:42   Dg Chest Port 1 View  Result Date: 11/29/2016 CLINICAL DATA:  Shortness of breath EXAM: PORTABLE CHEST 1 VIEW COMPARISON:  Portable chest x-ray of 11/28/2016, and CT chest of 11/26/2016 FINDINGS: There is little change in opacity at the left lung base consistent with atelectasis, pneumonia, and apparently loculated left pleural effusion. The right lung is grossly clear by chest x-ray. Mild cardiomegaly is stable. Median sternotomy sutures are noted from prior CABG. No acute bony abnormality is seen. The loop recorder is no longer seen. IMPRESSION: 1. Persistent opacity at the left lung base consistent with atelectasis, pneumonia, in apparently loculated left pleural effusion. 2. Stable cardiomegaly Electronically Signed   By: Ivar Drape M.D.   On: 11/29/2016 10:33   Dg Chest Port 1 View  Result Date: 11/28/2016 CLINICAL DATA:  71 year old female status post left side thoracentesis earlier this hour. EXAM: PORTABLE CHEST 1 VIEW COMPARISON:  0545 hours today and earlier. FINDINGS: Portable AP upright view at 1032 hours. No left pneumothorax. Continued subtotal opacification of the left hemithorax, ventilation not significantly changed. Visible mediastinal contours are stable. Cardiomegaly. Prior CABG. Left chest cardiac event recorder. Right lung appears stable and clear when allowing for portable technique. Negative visible bowel gas pattern. IMPRESSION: No pneumothorax or adverse features status post left side thoracentesis. Electronically Signed   By: Genevie Ann M.D.   On: 11/28/2016 10:53   Dg Chest Port 1 View  Result Date: 11/28/2016 CLINICAL DATA:  Shortness of breath.  Pneumonia EXAM: PORTABLE CHEST 1 VIEW COMPARISON:  Chest CT 2  days ago. FINDINGS: Extensive opacification of the left lung which is a combination of loculated pleural fluid and atelectasis/ consolidation. Cardiomegaly. Status post CABG. There is an implantable loop recorder. The right lung remains clear. IMPRESSION: 1. History of pneumonia with unchanged extensive left lung consolidation and complex pleural effusion. 2. Cardiomegaly. Electronically Signed   By: Monte Fantasia M.D.   On: 11/28/2016 08:04    Bonnita Hollow, MD 12/06/2016, 6:35 AM PGY-1, [facility] Intern pager: [phone], text pages welcome

## 2016-12-06 NOTE — Plan of Care (Signed)
Attempted to call report to 5W 

## 2016-12-07 ENCOUNTER — Other Ambulatory Visit: Payer: Self-pay | Admitting: Family Medicine

## 2016-12-07 ENCOUNTER — Telehealth: Payer: Self-pay | Admitting: Cardiology

## 2016-12-07 ENCOUNTER — Inpatient Hospital Stay (HOSPITAL_COMMUNITY): Payer: Medicare Other

## 2016-12-07 LAB — CBC
HEMATOCRIT: 24.6 % — AB (ref 36.0–46.0)
HEMOGLOBIN: 8.1 g/dL — AB (ref 12.0–15.0)
MCH: 30.3 pg (ref 26.0–34.0)
MCHC: 32.9 g/dL (ref 30.0–36.0)
MCV: 92.1 fL (ref 78.0–100.0)
Platelets: 255 10*3/uL (ref 150–400)
RBC: 2.67 MIL/uL — AB (ref 3.87–5.11)
RDW: 15.7 % — AB (ref 11.5–15.5)
WBC: 15.4 10*3/uL — AB (ref 4.0–10.5)

## 2016-12-07 LAB — COMPREHENSIVE METABOLIC PANEL
ALBUMIN: 2.1 g/dL — AB (ref 3.5–5.0)
ALK PHOS: 78 U/L (ref 38–126)
ALT: 27 U/L (ref 14–54)
AST: 27 U/L (ref 15–41)
Anion gap: 10 (ref 5–15)
BILIRUBIN TOTAL: 0.6 mg/dL (ref 0.3–1.2)
BUN: 128 mg/dL — AB (ref 6–20)
CO2: 20 mmol/L — ABNORMAL LOW (ref 22–32)
CREATININE: 3.34 mg/dL — AB (ref 0.44–1.00)
Calcium: 8.6 mg/dL — ABNORMAL LOW (ref 8.9–10.3)
Chloride: 111 mmol/L (ref 101–111)
GFR calc Af Amer: 15 mL/min — ABNORMAL LOW (ref >60)
GFR calc non Af Amer: 13 mL/min — ABNORMAL LOW (ref >60)
GLUCOSE: 235 mg/dL — AB (ref 65–99)
POTASSIUM: 4.1 mmol/L (ref 3.5–5.1)
Sodium: 141 mmol/L (ref 135–145)
TOTAL PROTEIN: 4.8 g/dL — AB (ref 6.5–8.1)

## 2016-12-07 LAB — GLUCOSE, CAPILLARY
GLUCOSE-CAPILLARY: 181 mg/dL — AB (ref 65–99)
Glucose-Capillary: 211 mg/dL — ABNORMAL HIGH (ref 65–99)
Glucose-Capillary: 181 mg/dL — ABNORMAL HIGH (ref 65–99)

## 2016-12-07 MED ORDER — BOOST / RESOURCE BREEZE PO LIQD: 1.0000 | Freq: Three times a day (TID) | ORAL | 0 refills | Status: AC

## 2016-12-07 MED ORDER — TORSEMIDE 20 MG PO TABS
50.0000 mg | ORAL_TABLET | Freq: Every day | ORAL | Status: DC
  Administered 2016-12-07: 50 mg via ORAL
  Filled 2016-12-07: qty 3

## 2016-12-07 MED ORDER — INSULIN GLARGINE 100 UNIT/ML ~~LOC~~ SOLN: 16.0000 [IU] | Freq: Every day | SUBCUTANEOUS | 11 refills | Status: DC

## 2016-12-07 MED ORDER — METRONIDAZOLE 500 MG PO TABS: 500.0000 mg | ORAL_TABLET | Freq: Three times a day (TID) | ORAL | 0 refills | Status: AC

## 2016-12-07 MED ORDER — AMLODIPINE BESYLATE 5 MG PO TABS: 5.0000 mg | ORAL_TABLET | Freq: Every day | ORAL | 0 refills | Status: DC

## 2016-12-07 MED ORDER — LEVOFLOXACIN 500 MG PO TABS: 500.0000 mg | ORAL_TABLET | ORAL | 0 refills | Status: AC

## 2016-12-07 MED ORDER — METOPROLOL TARTRATE 25 MG PO TABS: 25.0000 mg | ORAL_TABLET | Freq: Two times a day (BID) | ORAL | 0 refills | Status: DC

## 2016-12-07 MED ORDER — TORSEMIDE 10 MG PO TABS: 50.0000 mg | ORAL_TABLET | Freq: Every day | ORAL | 0 refills | Status: DC

## 2016-12-07 MED ORDER — RISAQUAD PO CAPS: 2.0000 | ORAL_CAPSULE | Freq: Every day | ORAL | 0 refills | Status: AC

## 2016-12-07 MED ORDER — APIXABAN 5 MG PO TABS
5.0000 mg | ORAL_TABLET | Freq: Two times a day (BID) | ORAL | Status: DC
Start: 2016-12-07 — End: 2016-12-07

## 2016-12-07 NOTE — Progress Notes (Signed)
Christine Santos to be D/C'd Home per MD order.  Discussed with the patient and all questions fully answered.  VSS, Skin clean, dry and intact without evidence of skin break down, no evidence of skin tears noted. IV catheter discontinued intact. Site without signs and symptoms of complications. Dressing and pressure applied.  An After Visit Summary was printed and given to the patient. Patient received prescription.  D/c education completed with patient/family including follow up instructions, medication list, d/c activities limitations if indicated, with other d/c instructions as indicated by MD - patient able to verbalize understanding, all questions fully answered.   Patient instructed to return to ED, call 911, or call MD for any changes in condition.   Patient escorted via [place], and D/C home via private auto.  Richardean Chimera 12/07/2016 6:34 PM

## 2016-12-07 NOTE — Telephone Encounter (Signed)
LMOVM requesting that pt send manual transmission b/c home monitor has not updated in at least 14 days.    

## 2016-12-07 NOTE — Care Management Note (Signed)
Case Management Note  Patient Details  Name: Christine Santos MRN: [id number] Date of Birth: 1945/06/16  Subjective/Objective:                  Admitted hypoxia,PNA.   Action/Plan: Plan is to d/c to home today with home health services pending MD's ordes. Pt declines SNF placement. Pt's family to provide transportation to home.  Expected Discharge Date:  11/27/16               Expected Discharge Plan:  [place]  In-House Referral:     Discharge planning Services  CM Consult  Post Acute Care Choice:    Choice offered to:     DME Arranged:   3 in 1/ [facility] DME Agency:   [place], [facility] will be delivered to be bedside prior to discharge.  HH Arranged:   PT,NA HH [facility]:   [facility]  Status of Service:  completed  If discussed at Long Length of Stay Meetings, dates discussed:    Additional Comments:  Sharin Mons, RN 12/07/2016, 12:11 PM

## 2016-12-07 NOTE — Progress Notes (Signed)
Family Medicine Teaching Service Daily Progress Note Intern Pager: [phone]  Patient name: Christine Santos Medical record number: [id number] Date of birth: 03-Jun-1945 Age: 71 y.o. Gender: female  Primary Care Provider: [name], MD Consultants: Pulmonology, TCTS, Nephrology Code Status: Full   Pt Overview and Major Events to Date:  11/2: Patient admitted for acute hypoxic respiratory failure, treatment started for PNA  11/3: Pulmonology consulted when CT scan chest showed substantial consolidation of left lung with a moderate to large left pleural effusion  11/5 Thoracentesis 11/6 VATS with chest tube placement 11/7 extubated, off pressors   Assessment and Plan: Christine Santos is a 71 y.o. female presenting with cough and dyspnea. PMH is significant for T2DM with peripheral neuropathy, CLL s/p chemotherapy, HTN, CKDVI, CAD s/p CABG, hx CVA in 2017, hx of afib on eliquis.   Pneumonia with Empyema: Respiratory status improved. Satting well on RA. Reports no pain. Proprionic bacterium (anerobic sp) found in wound culture. Likely polymicrobial infection. CCM/Pulmonology added Flagyl to antibiotic rePt is more alert this morning. S/p VATS 11/6. Strep Pneumo urine antigen positive. Blood cultures NGx5 days (final). Urine culture NGTD. Wound cultures NGTD. Acid fast smears negative. No fungal growth to date. WBC continues to increase to 14.2 > 15.1. Afebrile. Seems to be tolerating PO antibiotics well. d/cdIV Levaquin 750 mg (11/2-11/4) > (11/5  - 11/11 q48h for renal adustment), azactam (11/5 - 11/10 ). Will require flagyl for 2 weeks on top of 3 weeks levofloxacin given lack of improvement.  Flagyl IV 500 mg q6h (11/12 - 11/13). Converted to PO Flagyl.  - TCTS to remove chest tube sutures at office visit which will be arranged at discharge -, consider transition to PO 11/13 if tolerating - PO Flagyl 500 q8h (11/14 - ) - PO levaquin 500 q48h (11/11 - renal adjusted),  - xopenox for elevated  RR, tessalon perles prn - supplemental O2 to maintain O2 sats >/= 92%  - trend CBC  - TCTS recommendations appreciated - tylenol scheduled - norco 5-325, 1-2 q6h prn   AKI on CKD stage 4. Cr continues to improve to 3.34. Baseline Cr ~3.5. AKI. No longer on IVF.  - monitor CBC and BMP - avoid nephrotoxic medications - started back torsemide  Anemia Hb fluctuating between 7.7-8.1. Transfusion threshold 8.0. Hgb 10.0 on admit. Hg baseline ~10. Anticoagulation 5 mg Eliquis BID. Iron panel elevated Ferretin 1166 TIBC low at 237. Could consider epoetin for anemia b/c of CKD w/ anemia and elevated ferritin > 500, but TIBC Sat < 25. Also has h/o stroke and CLL which are contra-indications. Recommend further discussion with outpatient nephrology - Follow Hg - hold iron due to PNA  H/o diastolic CHF w/ h/o CAD s/p CABG 1996. Weights continues to climb to 184.+ 7L this stay. Hemodynamically stable. Metoprolol 25 mg BID. HR stable <110 last 24hrs. At home taking torsemide daily, alternating doses of 50 mg and 100 mg; metoprolol tartrate 12.5 mg BID; norvasc 10 mg daily; atorvastatin 80 mg daily. Last echo 2017 showed EF of 55-60% with G1DD. Troponin trend: 0.83>0.84>0.67.  - hold torsemide due to AKI.  - Continue metoprolol, norvasc, atorvastatin - I/Os - Daily weights   Afib:  HR < 110. Rate control with metoprolol 25 mg BID. On eliquis at home for anticoagulation for Afib. Restarted elequis yesterday, reduced to 2.5 mg BID, due to poor renal clearance.   - telemetry - eliquis 2.5 mg BID  T2DM CBG 185 - 267 past 24 hrs.  Last a1c 6.0 on 07/06/16, at home on lantus 37U daily with 5U humalog tid with meals.  - lantus 10 units qhs - monitor CBGs qACHS - SSI   HTN: Normotensive. At home on norvasc, metoprolol, torsemide. - continue metoprolol and norvasc - hold torsemide  HLD at home on fenofibrate and atorvastatin.last lipid panel 09/04/15. LDL 99. Triglycerides 188. - continue home  meds  GERDat home on protonix - continue home med  Goutat home on allopurinol  - continue home med  Anxiety at home on paxil 20 mg daily. restarted paxil 10 mg.  mood improved and and urine output adequate. Will increase to home dose - cont to monitor mood and urine output  PT/OT - PT/OT recommended SNF.  - Need 3-in-1 commode at discharge] - Pt is interested in returning home  Decubitus ulcer on sacrum Small ulcer on sacrum - wound nurse recommendations  FEN/GI: Carb modified.  Prophylaxis: elequis  Disposition: medically stable for discharge, with close outpatient follow up  Subjective:  Alert and sitting up in bed. Not on O2. Feels breathing is better. No complains of pain.  Objective: Temp:  [97.7 F (36.5 C)-98.5 F (36.9 C)] 97.9 F (36.6 C) (11/14 0436) Pulse Rate:  [50-112] 112 (11/14 0828) Resp:  [12-26] 18 (11/14 0436) BP: (141-180)/(59-90) 180/90 (11/14 0828) SpO2:  [94 %-100 %] 94 % (11/14 0436)  Physical Exam: General: NAD, resting alert in chair Cardiovascular: regular rate, regular rhythm. No murmur appreciated.  Respiratory: CTA bilaterally, improved air movement in left lung Abdomen: soft, NTND   Laboratory: Recent Labs  Lab 12/05/16 0436 12/05/16 1438 12/06/16 0300 12/07/16 0707  WBC 15.1*  --  14.9* 15.4*  HGB 7.6* 8.1* 7.7* 8.1*  HCT 22.5* 24.1* 22.7* 24.6*  PLT 258  --  264 255   Recent Labs  Lab 12/01/16 0402  12/05/16 0436 12/06/16 0300 12/07/16 0707  NA 131*   < > 137 136 141  K 4.4   < > 4.1 4.2 4.1  CL 98*   < > 106 108 111  CO2 19*   < > 19* 18* 20*  BUN 154*   < > 161* 150* 128*  CREATININE 7.35*   < > 4.69* 3.99* 3.34*  CALCIUM 7.6*   < > 8.4* 8.1* 8.6*  PROT 4.7*  --   --  4.5* 4.8*  BILITOT 0.8  --   --  0.7 0.6  ALKPHOS 81  --   --  102 78  ALT 32  --   --  28 27  AST 42*  --   --  28 27  GLUCOSE 163*   < > 182* 288* 235*   < > = values in this interval not displayed.     Imaging/Diagnostic Tests: Dg  Chest 2 View  Result Date: 11/25/2016 CLINICAL DATA:  71 y/o  F; chest pain with cough and hemoptysis. EXAM: CHEST  2 VIEW COMPARISON:  04/07/2014 PET-CT.  01/10/2009 chest radiograph. FINDINGS: Mild cardiomegaly. Aortic atherosclerosis with calcification. Status post CABG. Median sternotomy wires are aligned. Extensive consolidations throughout the left mid and lower lung zones. Small to moderate left effusion. Right upper quadrant cholecystectomy clips. Left humerus rotator cuff repair anchor. Multilevel degenerative changes of the spine. IMPRESSION: Extensive consolidation throughout left lung and small to moderate left effusion probably representing pneumonia. Follow-up to resolution is recommended to exclude underlying malignancy. Electronically Signed   By: Kristine Garbe M.D.   On: 11/25/2016 15:23   Ct Chest Wo  Contrast  Result Date: 11/26/2016 CLINICAL DATA:  Patient with respiratory failure. EXAM: CT CHEST WITHOUT CONTRAST TECHNIQUE: Multidetector CT imaging of the chest was performed following the standard protocol without IV contrast. COMPARISON:  Chest radiograph 11/25/2016. FINDINGS: Cardiovascular: Heart is enlarged. No pericardial effusion. Coronary arterial vascular calcifications. Thoracic aortic vascular calcifications. Stable fluid density structure along the anterior right aspect of the mediastinum (image 75; series 3). Mediastinum/Nodes: Multiple prominent subcentimeter mediastinal lymph nodes are visualized. The esophagus is unremarkable. Abarrent right subclavian vein. Lungs/Pleura: Bowing of the posterior trachea secondary to expiratory phase imaging. There is a a moderate complex left pleural effusion which appears partially loculated. There is extensive consolidation throughout the left lung. Minimal aerated left upper lobe. Patchy ground-glass and consolidative opacities demonstrated throughout the right upper, right middle and right lower lobes. Trace right pleural fluid.  No pneumothorax. Upper Abdomen: Status post cholecystectomy.  No acute process. Musculoskeletal: Thoracic spine degenerative changes. No aggressive or acute appearing osseous lesions. IMPRESSION: 1. There is extensive consolidation throughout the left lung which may be secondary to pneumonia in the appropriate clinical setting. There is a complex appearing loculated moderate left pleural effusion. Recommend radiographic followup to ensure resolution. 2. Patchy ground-glass and consolidative opacities scattered throughout the right lung which may be secondary to an infectious or inflammatory process. Recommend attention on follow-up. 3. Multiple prominent subcentimeter superior mediastinal lymph nodes are nonspecific however likely reactive in etiology. Recommend attention on follow-up. 4. Cardiomegaly.  Coronary arterial atherosclerosis. 5. Aortic Atherosclerosis (ICD10-I70.0). Electronically Signed   By: Lovey Newcomer M.D.   On: 11/26/2016 14:52   Dg Chest Port 1 View  Result Date: 12/07/2016 CLINICAL DATA:  Shortness of breath with respiratory failure EXAM: PORTABLE CHEST 1 VIEW COMPARISON:  December 06, 2016 FINDINGS: Central catheter has been removed. No pneumothorax. There remains loculated effusion on the left with patchy airspace consolidation remaining on the left. There is persistent atelectatic change in the right base. There is no new opacity on either side. Cardiomegaly is stable. Patient is status post coronary artery bypass grafting. There is aortic atherosclerosis. A loop recorder is noted on the left inferiorly. No bone lesions. IMPRESSION: Central catheter removed without pneumothorax. Persistent loculated effusion on the left with left lower lobe consolidation dx, stable. Right base atelectasis stable. Stable cardiomegaly. Aortic atherosclerosis. Aortic Atherosclerosis (ICD10-I70.0). Electronically Signed   By: Lowella Grip III M.D.   On: 12/07/2016 07:28   Dg Chest Port 1 View  Result  Date: 12/06/2016 CLINICAL DATA:  Cough EXAM: PORTABLE CHEST 1 VIEW COMPARISON:  12/05/2016 FINDINGS: Cardiac shadow is enlarged but stable. Postsurgical changes are again seen. A left jugular central line is again noted and stable although kinking is noted at the skin surface in the neck. Persistent loculated effusion is noted laterally on the left. Some underlying consolidation remains. No acute bony abnormality is seen. Right basilar atelectatic changes are seen. IMPRESSION: Overall stable appearance of the chest. Kinking of the left jugular central line at the skin surface. Electronically Signed   By: Inez Catalina M.D.   On: 12/06/2016 07:46   Dg Chest Port 1 View  Result Date: 12/05/2016 CLINICAL DATA:  Pleural effusion and shortness of breath EXAM: PORTABLE CHEST 1 VIEW COMPARISON:  December 04, 2016 FINDINGS: Central catheter tip is at the junction of the left innominate vein and superior vena cava, stable. No pneumothorax. There is loculated pleural effusion on the left with consolidation in the left lower lobe region. There has been partial clearing  of patchy opacity from the medial right base. Right lung is clear except for mild medial right base atelectasis. Heart remains prominent with pulmonary vascularity within normal limits. There is aortic atherosclerosis. Postoperative change noted in left shoulder. Patient is status post coronary artery bypass grafting. IMPRESSION: Persistent loculated effusion on the left with airspace consolidation left lower lobe. Mild atelectasis medial right base. Right lung otherwise clear. There has been partial clearing of consolidation from the medial right base. Cardiac prominence is stable. No pneumothorax. No new opacity evident. There is aortic atherosclerosis. Aortic Atherosclerosis (ICD10-I70.0). Electronically Signed   By: Lowella Grip III M.D.   On: 12/05/2016 07:27   Dg Chest Port 1 View  Result Date: 12/04/2016 CLINICAL DATA:  Pleural effusion  EXAM: PORTABLE CHEST 1 VIEW COMPARISON:  December 03, 2016 FINDINGS: Left chest tube has been removed. No evident pneumothorax. Central catheter tip is at the junction of the left innominate vein and superior vena cava, stable. There is loculated pleural effusion on the left. There is airspace consolidation in both medial lung bases, more on the left than on the right, stable. No new opacity. Heart is prominent with pulmonary vascularity within normal limits. There is aortic atherosclerosis. No bone lesions evident. There is postoperative change in the left shoulder. IMPRESSION: No pneumothorax. Loculated pleural effusion persists on the left. Airspace consolidation concerning for pneumonia noted in each medial lung base, more on the left than on the right, stable. No new opacity evident. Stable cardiac silhouette. There is aortic atherosclerosis. Aortic Atherosclerosis (ICD10-I70.0). Electronically Signed   By: Lowella Grip III M.D.   On: 12/04/2016 07:05   Dg Chest Port 1 View  Result Date: 12/03/2016 CLINICAL DATA:  Shortness of breath.  Empyema. EXAM: PORTABLE CHEST 1 VIEW COMPARISON:  December 02, 2016 FINDINGS: More superior chest tube on the left is again noted. More inferior chest tube on the left has been removed. Central catheter tip is at the junction of the left innominate vein and superior vena cava. No pneumothorax. There is consolidation throughout much of the left lung, stable. There is a small left pleural effusion. On the right, there is new patchy airspace consolidation in the medial base region. There is cardiomegaly with pulmonary vascularity within normal limits. Patient is status post coronary artery bypass grafting. There is aortic atherosclerosis. No adenopathy is demonstrable by radiography. No bone lesions. IMPRESSION: Or inferior chest tube on the left is been removed. No pneumothorax. Extensive airspace consolidation throughout much of the left lung remains. There is new patchy  consolidation in the medial right base. There is stable cardiomegaly. There is aortic atherosclerosis. Aortic Atherosclerosis (ICD10-I70.0). Electronically Signed   By: Lowella Grip III M.D.   On: 12/03/2016 07:08   Dg Chest Port 1 View  Result Date: 12/02/2016 CLINICAL DATA:  Chest tube, empyema and shortness-of-breath. EXAM: PORTABLE CHEST 1 VIEW COMPARISON:  12/01/2016 FINDINGS: Sternotomy wires unchanged. Left IJ central venous catheter unchanged with tip obliquely warranted over the SVC. Left basilar chest tube unchanged. Second catheter over the left thorax with tip towards the left apex unchanged. Moderate opacification of the left lung with slight interval worsening. Right lung is clear. Stable cardiomegaly. Remainder the exam is unchanged. IMPRESSION: Interval worsening opacification of the left lung compatible with known pleural fluid collection/empyema and associated atelectasis. Stable cardiomegaly. Tubes and lines as described. Electronically Signed   By: Marin Olp M.D.   On: 12/02/2016 07:26   Dg Chest Port 1 View  Result Date: 12/01/2016  CLINICAL DATA:  Status post VATS for left empyema. EXAM: PORTABLE CHEST 1 VIEW COMPARISON:  11/30/2016 FINDINGS: Two left thoracostomy tubes and left IJ central venous catheter with tip overlying the mid-upper SVC again noted. An endotracheal tube is been removed. Left pleural and parenchymal opacities are unchanged. There is no evidence of pneumothorax. Right basilar atelectasis is again noted. Cardiomegaly and CABG changes again identified. IMPRESSION: Endotracheal tube removal without other significant change. Support apparatus as described. No evidence of pneumothorax. Electronically Signed   By: Margarette Canada M.D.   On: 12/01/2016 08:47   Dg Chest Port 1 View  Result Date: 11/30/2016 CLINICAL DATA:  Hypoxia EXAM: PORTABLE CHEST 1 VIEW COMPARISON:  November 29, 2016 FINDINGS: Endotracheal tube tip is 2.3 cm above the carina. Central catheter tip  is at the junction of the left innominate vein and superior vena cava. There are chest tubes on the left, unchanged in position. No pneumothorax. Areas of consolidation throughout much of the left lung remain. No new opacity evident. No cavitation noted on the left. Right lung appears clear. There is cardiomegaly with pulmonary vascularity within normal limits. No adenopathy. Patient is status post coronary artery bypass grafting. There is postoperative change in the left shoulder. IMPRESSION: Tube and catheter positions without evident pneumothorax. Extensive airspace consolidation on the left is stable. No new opacity on either side. Right lungs clear. Stable cardiac enlargement. Electronically Signed   By: Lowella Grip III M.D.   On: 11/30/2016 08:50   Dg Chest Port 1 View  Result Date: 11/29/2016 CLINICAL DATA:  Empyema of the lung.  Surgery follow-up. EXAM: PORTABLE CHEST 1 VIEW COMPARISON:  11/29/2016 FINDINGS: An endotracheal tube has been placed with tip measuring 1.9 cm above the carina. A left central venous catheter was placed with tip directed somewhat horizontally in the mid right mediastinum, likely representing location of the confluence of the brachiocephalic and superior vena cava veins. 2 left chest tubes. Postoperative changes in the mediastinum. Shallow inspiration. Cardiac enlargement without vascular congestion. Consolidation in the left mid and lower lung zones is improved since previous study. Probable evacuation of pleural fluid collection. No visible pneumothorax. Surgical clips in the right upper quadrant. Loop recorder. Degenerative changes in the spine and shoulders. IMPRESSION: Appliances appear in satisfactory position. Shallow inspiration. Cardiac enlargement. Improved left lung consolidation. Interval evacuation of left pleural fluid collection. Electronically Signed   By: Lucienne Capers M.D.   On: 11/29/2016 22:42   Dg Chest Port 1 View  Result Date:  11/29/2016 CLINICAL DATA:  Shortness of breath EXAM: PORTABLE CHEST 1 VIEW COMPARISON:  Portable chest x-ray of 11/28/2016, and CT chest of 11/26/2016 FINDINGS: There is little change in opacity at the left lung base consistent with atelectasis, pneumonia, and apparently loculated left pleural effusion. The right lung is grossly clear by chest x-ray. Mild cardiomegaly is stable. Median sternotomy sutures are noted from prior CABG. No acute bony abnormality is seen. The loop recorder is no longer seen. IMPRESSION: 1. Persistent opacity at the left lung base consistent with atelectasis, pneumonia, in apparently loculated left pleural effusion. 2. Stable cardiomegaly Electronically Signed   By: Ivar Drape M.D.   On: 11/29/2016 10:33   Dg Chest Port 1 View  Result Date: 11/28/2016 CLINICAL DATA:  71 year old female status post left side thoracentesis earlier this hour. EXAM: PORTABLE CHEST 1 VIEW COMPARISON:  0545 hours today and earlier. FINDINGS: Portable AP upright view at 1032 hours. No left pneumothorax. Continued subtotal opacification of the left  hemithorax, ventilation not significantly changed. Visible mediastinal contours are stable. Cardiomegaly. Prior CABG. Left chest cardiac event recorder. Right lung appears stable and clear when allowing for portable technique. Negative visible bowel gas pattern. IMPRESSION: No pneumothorax or adverse features status post left side thoracentesis. Electronically Signed   By: Genevie Ann M.D.   On: 11/28/2016 10:53   Dg Chest Port 1 View  Result Date: 11/28/2016 CLINICAL DATA:  Shortness of breath.  Pneumonia EXAM: PORTABLE CHEST 1 VIEW COMPARISON:  Chest CT 2 days ago. FINDINGS: Extensive opacification of the left lung which is a combination of loculated pleural fluid and atelectasis/ consolidation. Cardiomegaly. Status post CABG. There is an implantable loop recorder. The right lung remains clear. IMPRESSION: 1. History of pneumonia with unchanged extensive left lung  consolidation and complex pleural effusion. 2. Cardiomegaly. Electronically Signed   By: Monte Fantasia M.D.   On: 11/28/2016 08:04    Bonnita Hollow, MD 12/07/2016, 9:25 AM PGY-1, [facility] Intern pager: [phone], text pages welcome

## 2016-12-07 NOTE — Care Management Important Message (Signed)
Important Message  Patient Details  Name: Christine Santos MRN: [id number] Date of Birth: Jun 18, 1945   Medicare Important Message Given:  Yes    Dwon Sky Abena 12/07/2016, 11:00 AM

## 2016-12-07 NOTE — Progress Notes (Signed)
Patient, roommate, and sister at bedside. Patient states she would like to go home. Her roommate stated he would be there to assist patient. RNCM setting up home health.  CSW signing off.  Percell Locus Huey Scalia LCSWA [phone]

## 2016-12-07 NOTE — Progress Notes (Signed)
Occupational Therapy Treatment Patient Details Name: Christine Santos MRN: [id number] DOB: 1945/05/28 Today's Date: 12/07/2016    History of present illness Pt adm with PNA and lt empyema. Pt to underwent VATS 11/6 PM. PMH - CLL, CHF, CKD Stage V, Afib, CVA, CAD   OT comments  Pt continues to make progress towards her OT goals however still requires varying degrees of assistance with mobility but for safety requires modAx2 for functional transfers in room mobility RW. Pt is min A +2 for safety during ADL - able to dress lower body with set up this session. OT continues to recommend SNF placement and discussed at length with Pt and boyfriend, however pt adamant about discharge home. Attempted to provide guidance to significant other in how best to assist pt with safety at home.  Follow Up Recommendations  SNF;Supervision/Assistance - 24 hour    Equipment Recommendations  3 in 1 bedside commode    Recommendations for Other Services      Precautions / Restrictions Precautions Precautions: Fall Restrictions Weight Bearing Restrictions: No       Mobility Bed Mobility               General bed mobility comments: seated in recliner at entry   Transfers Overall transfer level: Needs assistance Equipment used: Rolling walker (2 wheeled) Transfers: Sit to/from Stand Sit to Stand: Min assist;Mod assist;+2 physical assistance         General transfer comment: minAx2 for initial 2x sit<>stand from recliner, modAx2 required for sit>stand from lower toilet, decreased safety awareness when sitting, despite vc pt does not reach back for target and has decreased eccentric control in lowering to chair.     Balance Overall balance assessment: Needs assistance Sitting-balance support: No upper extremity supported;Feet supported Sitting balance-Leahy Scale: Good     Standing balance support: Bilateral upper extremity supported Standing balance-Leahy Scale: Poor Standing balance  comment: Reliant on UE support and external assistance.                            ADL either performed or assessed with clinical judgement   ADL Overall ADL's : Needs assistance/impaired Eating/Feeding: Supervision/ safety;Sitting Eating/Feeding Details (indicate cue type and reason): eating grilled cheese and grapes when OT entered Grooming: Min guard;Wash/dry hands;Standing Grooming Details (indicate cue type and reason): very rushed and impulsive, required cues to find soap and paper towels                 Toilet Transfer: Minimal assistance;+2 for safety/equipment;RW;Cueing for safety Toilet Transfer Details (indicate cue type and reason): Fluctuating assistance required, Pt sat VERY suddenly when she was too far away from the toilet, and required hands on assist to be safe Toileting- Clothing Manipulation and Hygiene: Moderate assistance;+2 for safety/equipment;Sit to/from stand Toileting - Clothing Manipulation Details (indicate cue type and reason): unable to manage mesh underwear without assist     Functional mobility during ADLs: Minimal assistance;+2 for safety/equipment;+2 for physical assistance;Cueing for safety;Rolling walker General ADL Comments: Initially min assist +2 to power up and maintain standing position. However, pt highly impulsive and will sit without warning. Additionally required increasing assistance with each sit<>stand     Vision       Perception     Praxis      Cognition Arousal/Alertness: Awake/alert Behavior During Therapy: Impulsive Overall Cognitive Status: Within Functional Limits for tasks assessed Area of Impairment: Attention;Memory;Following commands;Safety/judgement;Awareness;Problem solving  Current Attention Level: Selective Memory: Decreased short-term memory Following Commands: Follows one step commands inconsistently Safety/Judgement: Decreased awareness of safety Awareness:  Emergent Problem Solving: Slow processing;Decreased initiation;Difficulty sequencing;Requires verbal cues;Requires tactile cues General Comments: pt continues to sit impulsively without reaching back for target and has decreased eccentric control in descent to lower surface.         Exercises     Shoulder Instructions       General Comments Pt given extensive education on the benefit of SNF for improving safety and independence in ADL and functional transfers in her home environment. Pt continues to refuse so pt significant other educated on how to assist pt to maintain safety    Pertinent Vitals/ Pain       Pain Assessment: No/denies pain  Home Living                                          Prior Functioning/Environment              Frequency  Min 2X/week        Progress Toward Goals  OT Goals(current goals can now be found in the care plan section)  Progress towards OT goals: Progressing toward goals(continued decreased safety awareness)  Acute Rehab OT Goals Patient Stated Goal: to be warmer OT Goal Formulation: With patient/family Time For Goal Achievement: 12/19/16 Potential to Achieve Goals: [place] Discharge plan remains appropriate;Frequency remains appropriate    Co-evaluation      Reason for Co-Treatment: For patient/therapist safety;To address functional/ADL transfers          AM-PAC PT "6 Clicks" Daily Activity     Outcome Measure   Help from another person eating meals?: None Help from another person taking care of personal grooming?: A Little Help from another person toileting, which includes using toliet, bedpan, or urinal?: A Lot Help from another person bathing (including washing, rinsing, drying)?: A Lot Help from another person to put on and taking off regular upper body clothing?: A Little Help from another person to put on and taking off regular lower body clothing?: A Lot 6 Click Score: 16    End of Session  Equipment Utilized During Treatment: Gait belt;Rolling walker  OT Visit Diagnosis: Other abnormalities of gait and mobility (R26.89);Muscle weakness (generalized) (M62.81);Other symptoms and signs involving cognitive function   Activity Tolerance Patient tolerated treatment well   Patient Left in chair;with call bell/phone within reach;with family/visitor present   Nurse Communication Mobility status;Other (comment)(no chair alarm)        Time: 8144-8185 OT Time Calculation (min): 41 min  Charges: OT General Charges $OT Visit: 1 Visit OT Treatments $Self Care/Home Management : 23-37 mins  Hulda Humphrey OTR/L [place] 12/07/2016, 5:16 PM

## 2016-12-07 NOTE — Discharge Instructions (Signed)
You were admitted to the hospital for pneumonia. Please complete your antibiotics as prescribed. Please go to you appointments. You will have home health services to help you recover.  Community-Acquired Pneumonia, Adult Pneumonia is an infection of the lungs. One type of pneumonia can happen while a person is in a hospital. A different type can happen when a person is not in a hospital (community-acquired pneumonia). It is easy for this kind to spread from person to person. It can spread to you if you breathe near an infected person who coughs or sneezes. Some symptoms include:  A dry cough.  A wet (productive) cough.  Fever.  Sweating.  Chest pain.  Follow these instructions at home:  Take over-the-counter and prescription medicines only as told by your doctor. ? Only take cough medicine if you are losing sleep. ? If you were prescribed an antibiotic medicine, take it as told by your doctor. Do not stop taking the antibiotic even if you start to feel better.  Sleep with your head and neck raised (elevated). You can do this by putting a few pillows under your head, or you can sleep in a recliner.  Do not use tobacco products. These include cigarettes, chewing tobacco, and e-cigarettes. If you need help quitting, ask your doctor.  Drink enough water to keep your pee (urine) clear or pale yellow. A shot (vaccine) can help prevent pneumonia. Shots are often suggested for:  People older than 72 years of age.  People older than 72 years of age: ? Who are having cancer treatment. ? Who have long-term (chronic) lung disease. ? Who have problems with their body's defense system (immune system).  You may also prevent pneumonia if you take these actions:  Get the flu (influenza) shot every year.  Go to the dentist as often as told.  Wash your hands often. If soap and water are not available, use hand sanitizer.  Contact a doctor if:  You have a fever.  You lose sleep because your  cough medicine does not help. Get help right away if:  You are short of breath and it gets worse.  You have more chest pain.  Your sickness gets worse. This is very serious if: ? You are an older adult. ? Your body's defense system is weak.  You cough up blood. This information is not intended to replace advice given to you by your health care provider. Make sure you discuss any questions you have with your health care provider. Document Released: 06/29/2007 Document Revised: 06/18/2015 Document Reviewed: 05/07/2014 Elsevier Interactive Patient Education  [employer].

## 2016-12-07 NOTE — Progress Notes (Signed)
Physical Therapy Treatment Patient Details Name: Christine Santos MRN: [id number] DOB: 15-Sep-1945 Today's Date: 12/07/2016    History of Present Illness Pt adm with PNA and lt empyema. Pt to underwent VATS 11/6 PM. PMH - CLL, CHF, CKD Stage V, Afib, CVA, CAD    PT Comments    Pt continues to make progress towards her goals however still requires varying degrees of assistance with mobility but for safety requires modAx2 for transfers and ambulation of 20 feet with RW. PT continues to recommend SNF placement, however pt adamant about discharge home. Attempted to provide guidance to significant other in how best to assist pt with mobility at home. Pt requires skilled PT to progress mobility and improve strength and endurance to safely navigate their discharge environment.   Follow Up Recommendations  SNF;Other (comment)(Pt refusing needs max HH services and 24 hr supervision )     Equipment Recommendations  None recommended by PT    Recommendations for Other Services       Precautions / Restrictions Precautions Precautions: Fall Restrictions Weight Bearing Restrictions: No    Mobility  Bed Mobility               General bed mobility comments: seated in recliner at entry   Transfers Overall transfer level: Needs assistance Equipment used: Rolling walker (2 wheeled) Transfers: Sit to/from Stand Sit to Stand: Min assist;Mod assist;+2 physical assistance         General transfer comment: minAx2 for initial 2x sit<>stand from recliner, modAx2 required for sit>stand from lower toilet, decreased safety awareness when sitting, despite vc pt does not reach back for target and has decreased eccentric control in lowering to chair.   Ambulation/Gait Ambulation/Gait assistance: Mod assist;Min assist;+2 safety/equipment Ambulation Distance (Feet): 30 Feet Assistive device: Rolling walker (2 wheeled) Gait Pattern/deviations: Step-through pattern;Decreased step length -  right;Decreased step length - left;Decreased stride length;Drifts right/left;Trunk flexed;Shuffle Gait velocity: slowed Gait velocity interpretation: Below normal speed for age/gender General Gait Details: min-modAx2 for steadying in RW, vc for staying within walker and for upight posture, with final leg of ambulation encouraged significant other to assist as pt to d/c home today.       Balance Overall balance assessment: Needs assistance Sitting-balance support: No upper extremity supported;Feet supported Sitting balance-Leahy Scale: Good     Standing balance support: Bilateral upper extremity supported Standing balance-Leahy Scale: Poor Standing balance comment: Reliant on UE support and external assistance.                             Cognition Arousal/Alertness: Awake/alert Behavior During Therapy: Impulsive Overall Cognitive Status: Within Functional Limits for tasks assessed Area of Impairment: Attention;Memory;Following commands;Safety/judgement;Awareness;Problem solving                   Current Attention Level: Selective Memory: Decreased short-term memory Following Commands: Follows one step commands inconsistently Safety/Judgement: Decreased awareness of safety Awareness: Emergent Problem Solving: Slow processing;Decreased initiation;Difficulty sequencing;Requires verbal cues;Requires tactile cues General Comments: pt continues to sit impulsively without reaching back for target and has decreased eccentric control in descent to lower surface.          General Comments General comments (skin integrity, edema, etc.): Pt given extensive education on the benefit of SNF for improving gait and stability for safety in her home environment. Pt continues to refuse so pt significant other educated on how to assist pt to maintain safety      Pertinent  Vitals/Pain Pain Assessment: No/denies pain           PT Goals (current goals can now be found in the  care plan section) Acute Rehab PT Goals Patient Stated Goal: to be warmer PT Goal Formulation: With patient Time For Goal Achievement: 12/13/16 Potential to Achieve Goals: Good Progress towards PT goals: Progressing toward goals    Frequency    Min 3X/week      PT Plan Current plan remains appropriate    Co-evaluation PT/OT/SLP Co-Evaluation/Treatment: Yes Reason for Co-Treatment: For patient/therapist safety;To address functional/ADL transfers          AM-PAC PT "6 Clicks" Daily Activity  Outcome Measure  Difficulty turning over in bed (including adjusting bedclothes, sheets and blankets)?: A Little Difficulty moving from lying on back to sitting on the side of the bed? : A Little Difficulty sitting down on and standing up from a chair with arms (e.g., wheelchair, bedside commode, etc,.)?: Unable Help needed moving to and from a bed to chair (including a wheelchair)?: A Little Help needed walking in hospital room?: A Lot Help needed climbing 3-5 steps with a railing? : A Lot 6 Click Score: 14    End of Session Equipment Utilized During Treatment: Gait belt Activity Tolerance: Patient tolerated treatment well Patient left: in chair;with call bell/phone within reach;with family/visitor present Nurse Communication: Mobility status PT Visit Diagnosis: Unsteadiness on feet (R26.81);Muscle weakness (generalized) (M62.81);Other abnormalities of gait and mobility (R26.89)     Time: 7014-1030 PT Time Calculation (min) (ACUTE ONLY): 41 min  Charges:  $Gait Training: 8-22 mins                    G Codes:       Christine Santos B. Migdalia Dk PT, DPT Acute Rehabilitation  [phone] Pager [phone]     [place] 12/07/2016, 4:46 PM

## 2016-12-09 ENCOUNTER — Other Ambulatory Visit: Payer: Self-pay | Admitting: Family Medicine

## 2016-12-10 DIAGNOSIS — E1122 Type 2 diabetes mellitus with diabetic chronic kidney disease: Secondary | ICD-10-CM | POA: Diagnosis not present

## 2016-12-10 DIAGNOSIS — E1142 Type 2 diabetes mellitus with diabetic polyneuropathy: Secondary | ICD-10-CM | POA: Diagnosis not present

## 2016-12-10 DIAGNOSIS — I130 Hypertensive heart and chronic kidney disease with heart failure and stage 1 through stage 4 chronic kidney disease, or unspecified chronic kidney disease: Secondary | ICD-10-CM | POA: Diagnosis not present

## 2016-12-10 DIAGNOSIS — D631 Anemia in chronic kidney disease: Secondary | ICD-10-CM | POA: Diagnosis not present

## 2016-12-10 DIAGNOSIS — I5032 Chronic diastolic (congestive) heart failure: Secondary | ICD-10-CM | POA: Diagnosis not present

## 2016-12-10 DIAGNOSIS — E1151 Type 2 diabetes mellitus with diabetic peripheral angiopathy without gangrene: Secondary | ICD-10-CM | POA: Diagnosis not present

## 2016-12-10 DIAGNOSIS — J189 Pneumonia, unspecified organism: Secondary | ICD-10-CM | POA: Diagnosis not present

## 2016-12-10 DIAGNOSIS — F419 Anxiety disorder, unspecified: Secondary | ICD-10-CM | POA: Diagnosis not present

## 2016-12-10 DIAGNOSIS — M109 Gout, unspecified: Secondary | ICD-10-CM | POA: Diagnosis not present

## 2016-12-10 DIAGNOSIS — C9111 Chronic lymphocytic leukemia of B-cell type in remission: Secondary | ICD-10-CM | POA: Diagnosis not present

## 2016-12-10 DIAGNOSIS — N184 Chronic kidney disease, stage 4 (severe): Secondary | ICD-10-CM | POA: Diagnosis not present

## 2016-12-10 DIAGNOSIS — I480 Paroxysmal atrial fibrillation: Secondary | ICD-10-CM | POA: Diagnosis not present

## 2016-12-10 DIAGNOSIS — I2510 Atherosclerotic heart disease of native coronary artery without angina pectoris: Secondary | ICD-10-CM | POA: Diagnosis not present

## 2016-12-13 ENCOUNTER — Encounter: Payer: Self-pay | Admitting: Student

## 2016-12-13 ENCOUNTER — Ambulatory Visit: Payer: Medicare Other | Admitting: Student

## 2016-12-13 ENCOUNTER — Other Ambulatory Visit: Payer: Self-pay

## 2016-12-13 VITALS — BP 138/64 | HR 82 | Temp 97.9°F | Ht 65.0 in | Wt 179.2 lb

## 2016-12-13 DIAGNOSIS — J13 Pneumonia due to Streptococcus pneumoniae: Secondary | ICD-10-CM | POA: Diagnosis not present

## 2016-12-13 DIAGNOSIS — E1149 Type 2 diabetes mellitus with other diabetic neurological complication: Secondary | ICD-10-CM

## 2016-12-13 DIAGNOSIS — I10 Essential (primary) hypertension: Secondary | ICD-10-CM

## 2016-12-13 DIAGNOSIS — D631 Anemia in chronic kidney disease: Secondary | ICD-10-CM

## 2016-12-13 DIAGNOSIS — N184 Chronic kidney disease, stage 4 (severe): Secondary | ICD-10-CM | POA: Diagnosis not present

## 2016-12-13 DIAGNOSIS — R600 Localized edema: Secondary | ICD-10-CM | POA: Diagnosis not present

## 2016-12-13 LAB — POCT GLYCOSYLATED HEMOGLOBIN (HGB A1C): Hemoglobin A1C: 6.2

## 2016-12-13 NOTE — Progress Notes (Signed)
Subjective:    Christine Santos is a 71 y.o. old female here for hospital follow-up. She is here with family members who also provided history.  HPI Pneumonia: patient was recently hospitalized for left lung pneumonia with empyema from 11/25/2016 to 12/07/2016. She had VATS procedure with chest tube placement. Chest tube was removed. She was started on antibiotic and discharged home on Levaquin and Flagyl through 12/19/2016.  Today, patient has no complaint. She denies shortness of breath more than her usual. Denies chest pain, fever or cough. She is still taking the antibiotics. Family reports some generalized weakness. She was discharged with PT/OT. They states that PT will start coming to their house today.  Patient has a follow-up with cardiothoracic surgery in 3 weeks.   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 ([place]); HYPERCHOLESTEROLEMIA; OBESITY, NOS; Essential hypertension; Coronary atherosclerosis; SINUS BRADYCARDIA; Chronic systolic heart failure ([place]); Peripheral vascular disease ([place]); REFLUX ESOPHAGITIS; IRRITABLE BOWEL SYNDROME; Chronic kidney disease, stage IV (severe) ([place]); CYSTOCELE/RECTOCELE/PROLAPSE,UNSPEC.; Osteoarthritis, multiple sites; Anxiety; Stress incontinence, female; Gout; Anemia in chronic kidney disease; Foot pain; Osteopenia; Diabetic retinopathy ([place]); Macular degeneration; Diabetic neuropathy ([place]); Diabetic nephropathy ([place]); Hyperkalemia; Diabetes mellitus type 2 with neurological manifestations ([place]); Weight loss, unintentional; Sinusitis, chronic; Acute lacunar infarction; Acute on chronic kidney failure ([place]); Paroxysmal atrial fibrillation ([place]); Urinary tract infection; History of fracture; Estrogen deficiency; Hypoxia; Chest pain; Community acquired pneumonia of left lower lobe of lung ([place]); Elevated troponin; Loculated pleural effusion; SOB (shortness of breath); Pneumonia of left lung due to  Streptococcus pneumoniae ([place]); Empyema lung ([place]); Cough; Empyema ([place]); S/P thoracentesis; Pressure injury of skin; and Lower extremity edema on their problem list.   has a past medical history of Anemia (08/29/2011), Anxiety, Arthritis, CAD (coronary artery disease), Cataract, Chronic kidney disease, CLL (chronic lymphoblastic leukemia), Diastolic heart failure ([place]), GERD (gastroesophageal reflux disease), H/O hiatal hernia, Heart murmur, Hyperlipidemia, Hypertension, IDDM (insulin dependent diabetes mellitus) ([place]), PAD (peripheral artery disease) ([place]), Paroxysmal atrial fibrillation ([place]), Peripheral vascular disease ([place]), and Stroke (cerebrum) ([place]).  FH:  Family History  Problem Relation Age of Onset  . Heart disease Mother   . Heart attack Mother   . Hypertension Mother   . Kidney disease Mother   . Pneumonia Father   . Heart attack Father   . Hyperlipidemia Father   . Diabetes Sister   . Breast cancer Sister   . Arthritis Brother   . Diabetes Brother   . Heart attack Sister   . Obesity Sister   . Hypertension Sister   . Heart attack Sister   . Cancer Sister        breast  . Hyperlipidemia Sister   . Hypertension Sister   . Hyperlipidemia Sister   . Breast cancer Maternal Grandmother     SH Social History   Tobacco Use  . Smoking status: Never Smoker  . Smokeless tobacco: Never Used  Substance Use Topics  . Alcohol use: No  . Drug use: No    Review of Systems Review of systems negative except for pertinent positives and negatives in history of present illness above.     Objective:     Vitals:   12/13/16 0841  BP: 138/64  Pulse: 82  Temp: 97.9 F (36.6 C)  TempSrc: Oral  SpO2: 99%  Weight: 179 lb 3.2 oz (81.3 kg)  Height: 5\' 5"  (1.651 m)   Body mass index is 29.82 kg/m.  Physical Exam GEN: appears well, no ditress NECK: Supple  RESP:  No IWOB, CTAB but poor aeration globally due to poor respiratory effort CVS: irregularly irregular, RR, normal  S1&S2, no murmurs. 2+ pitting edema bilaterally.  GI: soft, NT with active BS MSK: 2+ pitting edema in her lower extremities bilaterally, chest wound site healing. No erythema or drainage. Stitches in place NEURO: grossly intact PSYCH: normal affect    Assessment and Plan:  Pneumonia of left lung due to Streptococcus pneumoniae, unspecified part of lung ([place]): Resolving. Still taking her Levaquin and Flagyl. She has follow-up with cardiothoracic surgery in 3 weeks.  Lower extremity edema: patient with +2 pitting edema bilaterally. Family states that this has been better than it was. She is about 5 lbs down from her discharge weight. Edema likely due to underlying CLL and anemia. Her last echocardiogram about a year ago basically normal. She has no respiratory symptoms either.  Will check CMP and CBC with differential. Recommended leg elevation and wearing compression stocking  Chronic kidney disease, stage IV (severe) (H[facility]) -We will check CMP given significant edema  Anemia in stage 4 chronic kidney disease ([place]) - CBC with Differential/Platelet  Diabetes mellitus type 2 with neurological manifestations ([place]) -A1c 6.2%  Return in about 1 week (around 12/20/2016) for Annual wellness visits.  Mercy Riding, MD 12/13/16 Pager: [phone]

## 2016-12-13 NOTE — Patient Instructions (Addendum)
It was great seeing you today! We have addressed the following issues today  Pneumonia: Continue taking antibiotic until you finish the whole course.  Go to your appointment with his cardiothoracic surgeon  Leg swelling: I recommend weighing yourself every morning.  You may double you fluid pill if you weight is 2-3 pounds higher than your regular weights.  I also recommend elevating the legs and wearing you compression stocking  Annual wellness visit: please schedule a follow-up appointment in 1-2 weeks for your annual wellness visit.   If we did any lab work today, and the results require attention, either me or my nurse will get in touch with you. If everything is normal, you will get a letter in mail and a message via . If you don't hear from [place] in two weeks, please give [place] a call. Otherwise, we look forward to seeing you again at your next visit. If you have any questions or concerns before then, please call the clinic at [phone].  Please bring all your medications to every doctors visit  Sign up for My Chart to have easy access to your labs results, and communication with your Primary care physician.    Please check-out at the front desk before leaving the clinic.    Take Care,   Dr. Cyndia Skeeters

## 2016-12-14 DIAGNOSIS — N184 Chronic kidney disease, stage 4 (severe): Secondary | ICD-10-CM | POA: Diagnosis not present

## 2016-12-14 DIAGNOSIS — I129 Hypertensive chronic kidney disease with stage 1 through stage 4 chronic kidney disease, or unspecified chronic kidney disease: Secondary | ICD-10-CM | POA: Diagnosis not present

## 2016-12-14 DIAGNOSIS — Z6830 Body mass index (BMI) 30.0-30.9, adult: Secondary | ICD-10-CM | POA: Diagnosis not present

## 2016-12-14 DIAGNOSIS — N2581 Secondary hyperparathyroidism of renal origin: Secondary | ICD-10-CM | POA: Diagnosis not present

## 2016-12-14 LAB — CMP14+EGFR
A/G RATIO: 2 (ref 1.2–2.2)
ALT: 44 IU/L — AB (ref 0–32)
AST: 77 IU/L — AB (ref 0–40)
Albumin: 3 g/dL — ABNORMAL LOW (ref 3.5–4.8)
Alkaline Phosphatase: 128 IU/L — ABNORMAL HIGH (ref 39–117)
BILIRUBIN TOTAL: 0.3 mg/dL (ref 0.0–1.2)
BUN/Creatinine Ratio: 26 (ref 12–28)
BUN: 86 mg/dL (ref 8–27)
CHLORIDE: 104 mmol/L (ref 96–106)
CO2: 19 mmol/L — ABNORMAL LOW (ref 20–29)
Calcium: 8.3 mg/dL — ABNORMAL LOW (ref 8.7–10.3)
Creatinine, Ser: 3.26 mg/dL — ABNORMAL HIGH (ref 0.57–1.00)
GFR calc Af Amer: 16 mL/min/{1.73_m2} — ABNORMAL LOW (ref >59)
GFR calc non Af Amer: 14 mL/min/{1.73_m2} — ABNORMAL LOW (ref >59)
Globulin, Total: 1.5 g/dL (ref 1.5–4.5)
Glucose: 151 mg/dL — ABNORMAL HIGH (ref 65–99)
POTASSIUM: 4.6 mmol/L (ref 3.5–5.2)
Sodium: 140 mmol/L (ref 134–144)
Total Protein: 4.5 g/dL — ABNORMAL LOW (ref 6.0–8.5)

## 2016-12-14 LAB — CBC WITH DIFFERENTIAL/PLATELET
BASOS ABS: 0 10*3/uL (ref 0.0–0.2)
Basos: 0 %
EOS (ABSOLUTE): 0.2 10*3/uL (ref 0.0–0.4)
Eos: 1 %
Hematocrit: 25.5 % — ABNORMAL LOW (ref 34.0–46.6)
Hemoglobin: 8.1 g/dL — ABNORMAL LOW (ref 11.1–15.9)
IMMATURE GRANS (ABS): 0 10*3/uL (ref 0.0–0.1)
IMMATURE GRANULOCYTES: 0 %
LYMPHS: 8 %
Lymphocytes Absolute: 1.1 10*3/uL (ref 0.7–3.1)
MCH: 31 pg (ref 26.6–33.0)
MCHC: 31.8 g/dL (ref 31.5–35.7)
MCV: 98 fL — ABNORMAL HIGH (ref 79–97)
Monocytes Absolute: 1 10*3/uL — ABNORMAL HIGH (ref 0.1–0.9)
Monocytes: 8 %
NEUTROS ABS: 11.1 10*3/uL — AB (ref 1.4–7.0)
NEUTROS PCT: 83 %
PLATELETS: 248 10*3/uL (ref 150–379)
RBC: 2.61 x10E6/uL — CL (ref 3.77–5.28)
RDW: 15.8 % — AB (ref 12.3–15.4)
WBC: 13.4 10*3/uL — ABNORMAL HIGH (ref 3.4–10.8)

## 2016-12-19 ENCOUNTER — Telehealth: Payer: Self-pay | Admitting: Student

## 2016-12-19 ENCOUNTER — Encounter: Payer: Self-pay | Admitting: Student

## 2016-12-19 ENCOUNTER — Other Ambulatory Visit: Payer: Self-pay | Admitting: Student

## 2016-12-19 DIAGNOSIS — R748 Abnormal levels of other serum enzymes: Secondary | ICD-10-CM

## 2016-12-19 DIAGNOSIS — D539 Nutritional anemia, unspecified: Secondary | ICD-10-CM

## 2016-12-19 NOTE — Progress Notes (Signed)
CMP is significant for mildly elevated LFTs.  Serum creatinine elevated but at baseline CBC with Hgb to 8.1 (stable).  As MCV to 98 Discussed those findings with the patient. Patient has an AWV tomorrow.  Ordered repeat CMP, folic acid, vit H85 and CK for tomorrow. Advised patient to hold her Lipitor as well

## 2016-12-19 NOTE — Telephone Encounter (Signed)
Realized that patient does not have an appointment for tomorrow, 10/27.  So called patient back.  She is under the impression that she had the annual wellness visit scheduled for 11/27 when she was here last week. She would like to have this scheduled for tomorrow 11/27 if possible. Talked to [place] who is willing to see her at 10 am on 12/20/2016. Called patient back and informed her about the appointment time. She will have her blood work done at the same time.

## 2016-12-20 ENCOUNTER — Ambulatory Visit: Payer: Medicare Other | Admitting: *Deleted

## 2016-12-20 DIAGNOSIS — D539 Nutritional anemia, unspecified: Secondary | ICD-10-CM | POA: Diagnosis not present

## 2016-12-20 DIAGNOSIS — R748 Abnormal levels of other serum enzymes: Secondary | ICD-10-CM | POA: Diagnosis not present

## 2016-12-21 LAB — CMP14+EGFR
A/G RATIO: 2.1 (ref 1.2–2.2)
ALK PHOS: 128 IU/L — AB (ref 39–117)
ALT: 238 IU/L — AB (ref 0–32)
AST: 357 IU/L — AB (ref 0–40)
Albumin: 3.3 g/dL — ABNORMAL LOW (ref 3.5–4.8)
BILIRUBIN TOTAL: 0.2 mg/dL (ref 0.0–1.2)
BUN/Creatinine Ratio: 28 (ref 12–28)
BUN: 103 mg/dL (ref 8–27)
CHLORIDE: 103 mmol/L (ref 96–106)
CO2: 20 mmol/L (ref 20–29)
Calcium: 8.5 mg/dL — ABNORMAL LOW (ref 8.7–10.3)
Creatinine, Ser: 3.64 mg/dL — ABNORMAL HIGH (ref 0.57–1.00)
GFR calc Af Amer: 14 mL/min/{1.73_m2} — ABNORMAL LOW (ref >59)
GFR, EST NON AFRICAN AMERICAN: 12 mL/min/{1.73_m2} — AB (ref >59)
GLOBULIN, TOTAL: 1.6 g/dL (ref 1.5–4.5)
Glucose: 166 mg/dL — ABNORMAL HIGH (ref 65–99)
POTASSIUM: 4.8 mmol/L (ref 3.5–5.2)
SODIUM: 145 mmol/L — AB (ref 134–144)
Total Protein: 4.9 g/dL — ABNORMAL LOW (ref 6.0–8.5)

## 2016-12-21 LAB — CK: CK TOTAL: 3450 U/L — AB (ref 24–173)

## 2016-12-21 LAB — VITAMIN B12: VITAMIN B 12: 934 pg/mL (ref 232–1245)

## 2016-12-21 LAB — FOLATE

## 2016-12-23 ENCOUNTER — Encounter: Payer: Self-pay | Admitting: Family Medicine

## 2016-12-23 ENCOUNTER — Ambulatory Visit: Payer: Medicare Other | Admitting: Family Medicine

## 2016-12-23 ENCOUNTER — Other Ambulatory Visit: Payer: Self-pay

## 2016-12-23 DIAGNOSIS — N184 Chronic kidney disease, stage 4 (severe): Secondary | ICD-10-CM

## 2016-12-23 DIAGNOSIS — R740 Nonspecific elevation of levels of transaminase and lactic acid dehydrogenase [LDH]: Secondary | ICD-10-CM | POA: Diagnosis not present

## 2016-12-23 DIAGNOSIS — R7401 Elevation of levels of liver transaminase levels: Secondary | ICD-10-CM | POA: Insufficient documentation

## 2016-12-23 DIAGNOSIS — I10 Essential (primary) hypertension: Secondary | ICD-10-CM | POA: Diagnosis not present

## 2016-12-23 DIAGNOSIS — R600 Localized edema: Secondary | ICD-10-CM

## 2016-12-23 NOTE — Assessment & Plan Note (Signed)
Hold both atorvastatin and fenofibrate until resolves.

## 2016-12-23 NOTE — Assessment & Plan Note (Signed)
I am reluctant to push torsemide.  See if DCing amlodipine helps peripheral edema.  New baseline likely ~3.5

## 2016-12-23 NOTE — Patient Instructions (Addendum)
Stop the amlodipine. Stop lipitor/atorvastatin Stop fenofibrate. Keep up with protein shakes, the physical therapy. Stay on all other medications  See me in one week. No blood work this week. I will do blood work next week.

## 2016-12-23 NOTE — Assessment & Plan Note (Signed)
Mostly from CKD.  Amlodipine may play a role.  DC amlodipine.

## 2016-12-23 NOTE — Progress Notes (Signed)
   Subjective:    Patient ID: Christine Santos, female    DOB: 06/09/1945, 71 y.o.   MRN: [id number]  HPI FU hospitalization for severe pneumonia.  Issues: 1. Very weak.  Cannot stand without help.  Seeing PT.  Slowly improving. 2. Recently noted to have transaminase elevation.  No good explanation. 3. Markedly swollen legs.  Known CKD 4-5.  On torsemide.  Had AKI with pneumonia.  Old baseline creat was 3.0.  New baseline may be ~3.5.    Review of Systems     Objective:   Physical Exam Obviously marked quad weakness Abd benign 3+ bilateral leg edema.       Assessment & Plan:

## 2016-12-23 NOTE — Assessment & Plan Note (Signed)
Perhaps overtreated at this point.  DC amlodipine.

## 2016-12-27 ENCOUNTER — Telehealth: Payer: Self-pay | Admitting: Family Medicine

## 2016-12-27 ENCOUNTER — Telehealth: Payer: Self-pay | Admitting: *Deleted

## 2016-12-27 NOTE — Telephone Encounter (Signed)
Received message on nurse line from PT with [facility] requesting VO for Skilled Nursing Visits for sacral wound and disease management teaching. Please call [phone] to give VO. Hubbard Hartshorn, RN, BSN

## 2016-12-27 NOTE — Telephone Encounter (Signed)
Called and discussed.  Coldish but no documented fever.  Does not feel like she has an infection.   Has appointment in 2 days.  Offered to see tomorrow afternoon. Decrease lantus from 20 to 16 units. Tomorrow am take demedex 150 mg.   Be sure to keep appointment in two days. See me tomorrow if worse.

## 2016-12-27 NOTE — Telephone Encounter (Signed)
Called and order given

## 2016-12-27 NOTE — Telephone Encounter (Signed)
Pt has gained 3 lbs overnight.  Her feet are so swollen she cannot walk. She has taken an extra fluid pill this morning. Sugar dropped to 71 at 8am this morning before eating. She  Is having chills. Please advise,

## 2016-12-28 ENCOUNTER — Telehealth: Payer: Self-pay | Admitting: Cardiology

## 2016-12-28 NOTE — Telephone Encounter (Signed)
Spoke w/ pt and requested that she send a manual transmission b/c her home monitor has not updated in at least 14 days.   

## 2016-12-28 NOTE — Telephone Encounter (Signed)
Returned patient's call.  She reports that yesterday she felt her heart "quivering" for the first time and was worried about it.  She is unsure how long the sensation lasted.  This is the first time she has experienced this symptom.  She denies ShOB, chest discomfort, dizziness, or other episodes of palpitations.  A HHRN comes by to check her BP and O2 levels and patient reports these are doing well.  Total AF burden per LINQ is 14.8% (since 05/2016), though trends suggest persistent A-fib since the beginning of 11/2016.  Rates controlled per histograms.  This correlates with A-fib noted during recent hospitalization.  Patient has PCP f/u tomorrow, 12/29/16.  Advised patient that a "quivering" sensation is not unusual during A-fib, but that if she experiences new or worsening (or longer duration) symptoms, that she should let us know.  Advised patient to continue Eliquis as instructed.  Patient follows with Dr. Irish Lack.  Patient verbalizes understanding of instructions and denies additional questions or concerns at this time.

## 2016-12-28 NOTE — Telephone Encounter (Signed)
Called pt to request a manual transmission w/ her home monitor has not updated in the last 14 days. She state that on Tuesday 12-27-16 she felt a flutter in her heart. I informed her that I would send Device Tech RN a note and have them review and the transmission and they will call back w/ the results. Pt verbalized understanding.

## 2016-12-29 ENCOUNTER — Other Ambulatory Visit: Payer: Self-pay

## 2016-12-29 ENCOUNTER — Encounter: Payer: Self-pay | Admitting: Family Medicine

## 2016-12-29 ENCOUNTER — Ambulatory Visit: Payer: Medicare Other | Admitting: Family Medicine

## 2016-12-29 DIAGNOSIS — R740 Nonspecific elevation of levels of transaminase and lactic acid dehydrogenase [LDH]: Secondary | ICD-10-CM

## 2016-12-29 DIAGNOSIS — I5022 Chronic systolic (congestive) heart failure: Secondary | ICD-10-CM | POA: Diagnosis not present

## 2016-12-29 DIAGNOSIS — I480 Paroxysmal atrial fibrillation: Secondary | ICD-10-CM

## 2016-12-29 DIAGNOSIS — R5381 Other malaise: Secondary | ICD-10-CM | POA: Diagnosis not present

## 2016-12-29 DIAGNOSIS — N184 Chronic kidney disease, stage 4 (severe): Secondary | ICD-10-CM | POA: Diagnosis not present

## 2016-12-29 DIAGNOSIS — R7401 Elevation of levels of liver transaminase levels: Secondary | ICD-10-CM

## 2016-12-29 LAB — FUNGAL ORGANISM REFLEX

## 2016-12-29 LAB — FUNGUS CULTURE RESULT

## 2016-12-29 LAB — FUNGUS CULTURE WITH STAIN

## 2016-12-29 MED ORDER — TORSEMIDE 100 MG PO TABS: 150.0000 mg | ORAL_TABLET | Freq: Every day | ORAL | Status: DC

## 2016-12-29 NOTE — Patient Instructions (Signed)
See me next week. Stay on the torsemide 1 & 1/2 pill per day until then.   No other changes.

## 2016-12-30 ENCOUNTER — Encounter: Payer: Self-pay | Admitting: Family Medicine

## 2016-12-30 DIAGNOSIS — R5381 Other malaise: Secondary | ICD-10-CM | POA: Insufficient documentation

## 2016-12-30 NOTE — Assessment & Plan Note (Signed)
No labs today.  Will recheck CMP next week.

## 2016-12-30 NOTE — Assessment & Plan Note (Signed)
Due to recent hospitalization for pneumonia.

## 2016-12-30 NOTE — Assessment & Plan Note (Signed)
Will need CMP next week given my recent increase in her torsemide dose.

## 2016-12-30 NOTE — Assessment & Plan Note (Signed)
Marked volume overload.  Push diuretic and monitor creat

## 2016-12-30 NOTE — Progress Notes (Signed)
   Subjective:    Patient ID: Christine Santos, female    DOB: 20-Nov-1945, 71 y.o.   MRN: [id number]  HPI  Feeling a bit better.  Still weak.  Still with major leg swelling.  Please see note of 1 week ago and telephone note.   Issues. BP seems improved off amlodipine.   CHF. Weight is down slightly.  On phone, I increased toresmide from 100 mg daily to 150 mg daily two days ago.   Still working with PT on deconditioning. CKD stable at last week BMP.    Review of Systems     Objective:   Physical Exam Lungs clear Cardiac rate controled.  Irreg irreg. Ext 3+ edema       Assessment & Plan:

## 2016-12-30 NOTE — Assessment & Plan Note (Signed)
Nicely rate controled and on anticoag.

## 2017-01-02 ENCOUNTER — Other Ambulatory Visit: Payer: Self-pay | Admitting: Cardiothoracic Surgery

## 2017-01-02 DIAGNOSIS — Z9889 Other specified postprocedural states: Secondary | ICD-10-CM

## 2017-01-04 ENCOUNTER — Ambulatory Visit (INDEPENDENT_AMBULATORY_CARE_PROVIDER_SITE_OTHER): Payer: Self-pay | Admitting: Cardiothoracic Surgery

## 2017-01-04 ENCOUNTER — Encounter: Payer: Self-pay | Admitting: Cardiothoracic Surgery

## 2017-01-04 ENCOUNTER — Other Ambulatory Visit: Payer: Self-pay

## 2017-01-04 ENCOUNTER — Ambulatory Visit
Admission: RE | Admit: 2017-01-04 | Discharge: 2017-01-04 | Disposition: A | Discharge status: Home / Self Care | Payer: Medicare Other | Source: Ambulatory Visit | Attending: Cardiothoracic Surgery | Admitting: Cardiothoracic Surgery

## 2017-01-04 VITALS — BP 136/66 | HR 107 | Resp 18 | Ht 65.0 in | Wt 178.3 lb

## 2017-01-04 DIAGNOSIS — J918 Pleural effusion in other conditions classified elsewhere: Secondary | ICD-10-CM

## 2017-01-04 DIAGNOSIS — J90 Pleural effusion, not elsewhere classified: Secondary | ICD-10-CM | POA: Diagnosis not present

## 2017-01-04 DIAGNOSIS — J189 Pneumonia, unspecified organism: Secondary | ICD-10-CM

## 2017-01-04 DIAGNOSIS — Z9889 Other specified postprocedural states: Secondary | ICD-10-CM

## 2017-01-04 NOTE — Progress Notes (Signed)
PCP is Hensel, Jamal Collin, MD Referring Provider is [name], MD  Chief Complaint  Patient presents with  . Routine Post Op    f/u s/p left VATS and chest xray    HPI: The patient presents for scheduled postop follow-up visit after left VATS and drainage of empyema. She has finish her extended course of antibiotics. Chest x-ray done today shows resolution of the left pleural infection and left lower lobe pneumonia. The incision is healed and chest tube sutures are removed in the office.  Patient has mild shortness of breath with exertion but is still decondition. Home physical therapy is attending the patient. She has chronic renal failure and her recent illness has increased her creatinine. She is being seen weekly by her primary care physician who is following her renal function.  Past Medical History:  Diagnosis Date  . Anemia 08/29/2011  . Anxiety   . Arthritis    "legs" (01/22/2013)  . CAD (coronary artery disease)    s/p CABG in 1999  . Cataract   . Chronic kidney disease   . CLL (chronic lymphoblastic leukemia)   . Diastolic heart failure ([place])   . GERD (gastroesophageal reflux disease)   . H/O hiatal hernia   . Heart murmur   . Hyperlipidemia   . Hypertension   . IDDM (insulin dependent diabetes mellitus) ([place])   . PAD (peripheral artery disease) ([place])   . Paroxysmal atrial fibrillation ([facility])    a. identified on ILR as part of StrokeAF study  . Peripheral vascular disease ([place])   . Stroke (cerebrum) [facility])     Past Surgical History:  Procedure Laterality Date  . ANGIOPLASTY / STENTING FEMORAL Right 09/2010   SFA/notes 11/25/2010 (01/22/2013)  . ANGIOPLASTY / STENTING FEMORAL Left 11/2010   SFA/notes 11/25/2010 (01/22/2013)  . ANGIOPLASTY / STENTING ILIAC     [name] 11/25/2010 (01/22/2013)  . CATARACT EXTRACTION W/ INTRAOCULAR LENS  IMPLANT, BILATERAL Bilateral ?2011  . CHOLECYSTECTOMY  1993  . CORONARY ARTERY BYPASS GRAFT  10/1994   "CABG X3"  . EP  IMPLANTABLE DEVICE N/A 09/11/2015   Procedure: Loop Recorder Insertion;  Surgeon: Thompson Grayer, MD;  Location: [facility];  Service: Cardiovascular;  Laterality: N/A;  . HEEL SPUR EXCISION Bilateral 1970's  . LOWER EXTREMITY ANGIOGRAM N/A 01/22/2013   Procedure: LOWER EXTREMITY ANGIOGRAM;  Surgeon: [name], MD;  Location: [facility];  Service: Cardiovascular;  Laterality: N/A;  . SHOULDER OPEN ROTATOR CUFF REPAIR Bilateral 1990's   "2 times on 1 side; once on the other"  . TRANSLUMINAL ATHERECTOMY FEMORAL ARTERY Right 01/22/2013   & balloon  . TUBAL LIGATION  1980's  . VIDEO ASSISTED THORACOSCOPY (VATS)/EMPYEMA Left 11/29/2016   Procedure: VIDEO ASSISTED THORACOSCOPY (VATS)/EMPYEMA;  Surgeon: [name], MD;  Location: [facility] OR;  Service: Thoracic;  Laterality: Left;  VIDEO ASSISTED THORACOSCOPY (VATS)/EMPYEMA    Family History  Problem Relation Age of Onset  . Heart disease Mother   . Heart attack Mother   . Hypertension Mother   . Kidney disease Mother   . Pneumonia Father   . Heart attack Father   . Hyperlipidemia Father   . Diabetes Sister   . Breast cancer Sister   . Arthritis Brother   . Diabetes Brother   . Heart attack Sister   . Obesity Sister   . Hypertension Sister   . Heart attack Sister   . Cancer Sister        breast  .  Hyperlipidemia Sister   . Hypertension Sister   . Hyperlipidemia Sister   . Breast cancer Maternal Grandmother     Social History Social History   Tobacco Use  . Smoking status: Never Smoker  . Smokeless tobacco: Never Used  Substance Use Topics  . Alcohol use: No  . Drug use: No    Current Outpatient Medications  Medication Sig Dispense Refill  . acidophilus (RISAQUAD) CAPS capsule Take 2 capsules daily by mouth. 60 capsule 0  . allopurinol (ZYLOPRIM) 100 MG tablet TAKE 1 TABLET(100 MG) BY MOUTH DAILY 90 tablet 3  . Ascorbic Acid (VITAMIN C) 100 MG tablet Take 100 mg by mouth daily.    . carboxymethylcellulose  (REFRESH PLUS) 0.5 % SOLN 1 drop 3 (three) times daily as needed.    Marland Kitchen ELIQUIS 5 MG TABS tablet TAKE 1 TABLET(5 MG) BY MOUTH TWICE DAILY 60 tablet 11  . feeding supplement (BOOST / RESOURCE BREEZE) LIQD Take 1 Container 3 (three) times daily between meals by mouth. 90 Container 0  . ferrous sulfate 325 (65 FE) MG tablet Take 1 tablet (325 mg total) by mouth daily with breakfast.  3  . GNP GARLIC EXTRACT PO Take 1 tablet by mouth daily.     Marland Kitchen HUMALOG KWIKPEN 100 UNIT/ML KiwkPen Inject 5 Units as directed 3 (three) times daily.  3  . hydrALAZINE (APRESOLINE) 50 MG tablet Take 1 tablet (50 mg total) by mouth 3 (three) times daily. 90 tablet 9  . insulin glargine (LANTUS) 100 UNIT/ML injection Inject 0.16 mLs (16 Units total) at bedtime into the skin. 10 mL 11  . LANTUS SOLOSTAR 100 UNIT/ML Solostar Pen ADMINISTER 35 UNITS UNDER THE SKIN DAILY 15 mL 6  . Melatonin 10 MG CAPS Take 10 mg by mouth at bedtime as needed (sleep).     . metoprolol tartrate (LOPRESSOR) 25 MG tablet TAKE 1 TABLET BY MOUTH TWICE DAILY 180 tablet 3  . Multiple Vitamins-Minerals (MULTIVITAMINS THER. W/MINERALS) TABS Take 1 tablet by mouth daily.      Marland Kitchen omega-3 acid ethyl esters (LOVAZA) 1 g capsule Take 2 g by mouth 2 (two) times daily.     . pantoprazole (PROTONIX) 40 MG tablet TAKE 1 TABLET BY MOUTH DAILY (Patient taking differently: TAKE 40 mh TABLET BY MOUTH DAILY) 90 tablet 3  . PARoxetine (PAXIL) 20 MG tablet TAKE 1 TABLET BY MOUTH EVERY MORNING (Patient taking differently: TAKE 20 mg TABLET BY MOUTH EVERY MORNING) 90 tablet 3  . torsemide (DEMADEX) 100 MG tablet Take 1.5 tablets (150 mg total) by mouth daily.    . traMADol (ULTRAM) 50 MG tablet TAKE 1 TABLET BY MOUTH FOUR TIMES DAILY (Patient taking differently: TAKE 50 mg TABLET BY MOUTH FOUR TIMES DAILY) 360 tablet 1   No current facility-administered medications for this visit.     Allergies  Allergen Reactions  . Other     Darvocet  . Penicillins   . Amoxicillin  Rash    Has patient had a PCN reaction causing immediate rash, facial/tongue/throat swelling, SOB or lightheadedness with hypotension: No Has patient had a PCN reaction causing severe rash involving mucus membranes or skin necrosis: No Has patient had a PCN reaction that required hospitalization: No Has patient had a PCN reaction occurring within the last 10 years: No If all of the above answers are "NO", then may proceed with Cephalosporin use.   . Cephalexin Rash  . Codeine Phosphate Rash    Review of Systems  No fever Weight  is steady Bilateral lower leg edema despite Demadex 100 mg daily No shortness of breath or productive cough  BP 136/66 (BP Location: Left Arm, Patient Position: Sitting, Cuff Size: Large)   Pulse (!) 107   Resp 18   Ht 5\' 5"  (1.651 m)   Wt 178 lb 4.8 oz (80.9 kg)   SpO2 94% Comment: on RA  BMI 29.67 kg/m  Physical Exam      Exam    General- alert and comfortable   Lungs- clear without rales, wheezes   Cor- regular rate and rhythm, no murmur , gallop   Abdomen- soft, non-tender   Extremities - warm, non-tender, 3+ bilateral tibial edema   Neuro- oriented, appropriate, no focal weakness. Patient was able to walk down the hallway 100 feet and oxygen saturation remained stable at 97% on room air  Diagnostic Tests: Chest x-ray images personally reviewed showing resolution of left empyema  Impression: Patient is recovered from her acute pulmonary infection. She still is deconditioned and will benefit from home physical therapy. She should not resume driving for another month. Plan: Normal skin care to her incision           She is encouraged to walk as much as possible to build up her conditioning   Len Childs, MD [facility] [phone]

## 2017-01-05 ENCOUNTER — Other Ambulatory Visit: Payer: Self-pay

## 2017-01-05 ENCOUNTER — Ambulatory Visit: Payer: Medicare Other | Admitting: Family Medicine

## 2017-01-05 ENCOUNTER — Encounter: Payer: Self-pay | Admitting: Family Medicine

## 2017-01-05 DIAGNOSIS — N184 Chronic kidney disease, stage 4 (severe): Secondary | ICD-10-CM

## 2017-01-05 DIAGNOSIS — I5022 Chronic systolic (congestive) heart failure: Secondary | ICD-10-CM

## 2017-01-05 DIAGNOSIS — R5381 Other malaise: Secondary | ICD-10-CM

## 2017-01-05 MED ORDER — TORSEMIDE 100 MG PO TABS: 100.0000 mg | ORAL_TABLET | Freq: Every day | ORAL | Status: DC

## 2017-01-05 NOTE — Progress Notes (Signed)
   Subjective:    Patient ID: Christine Santos, female    DOB: 1945-09-10, 71 y.o.   MRN: [id number]  HPI FU CHF, CKD, deconditioning.  Slowly getting stronger, Able to do most ADLs now.  PT still seeing.  DOE is at baseline.  Still with considerable leg swelling.   She was only taking 100 mg torsemide daily.  (I had asked her to increase to 150 - see last week AVS.)      Review of Systems     Objective:   Physical Exam No change in wt. Lungs clear Cardiac RRR without m or g Ext 3+ bilateral edema.        Assessment & Plan:

## 2017-01-05 NOTE — Assessment & Plan Note (Addendum)
Check BMP.  Watch for renal toxicity to high dose diuretics.  Creat is great - much lower than expected.  Increase Torsemide to 150 mg daily.

## 2017-01-05 NOTE — Patient Instructions (Signed)
I will call tomorrow with blood results and plan. Remind me of two things when I call, 1. May need new prescription for torsemide. 2. When follow up.

## 2017-01-05 NOTE — Assessment & Plan Note (Signed)
Slowly improving with PT.

## 2017-01-05 NOTE — Assessment & Plan Note (Addendum)
Still volume overloaded.  Await BMP.  Provided Creat is at her baseline, I will increase diuretic (increase torsemide or add HCTZ)  Creat is great - much lower than expected.  Increase Torsemide to 150 mg daily.

## 2017-01-06 LAB — BASIC METABOLIC PANEL
BUN/Creatinine Ratio: 23 (ref 12–28)
BUN: 52 mg/dL — ABNORMAL HIGH (ref 8–27)
CALCIUM: 9 mg/dL (ref 8.7–10.3)
CHLORIDE: 101 mmol/L (ref 96–106)
CO2: 28 mmol/L (ref 20–29)
Creatinine, Ser: 2.23 mg/dL — ABNORMAL HIGH (ref 0.57–1.00)
GFR calc Af Amer: 25 mL/min/{1.73_m2} — ABNORMAL LOW (ref >59)
GFR calc non Af Amer: 22 mL/min/{1.73_m2} — ABNORMAL LOW (ref >59)
GLUCOSE: 146 mg/dL — AB (ref 65–99)
POTASSIUM: 4.8 mmol/L (ref 3.5–5.2)
SODIUM: 143 mmol/L (ref 134–144)

## 2017-01-06 MED ORDER — TORSEMIDE 100 MG PO TABS: 150.0000 mg | ORAL_TABLET | Freq: Every day | ORAL | 3 refills | Status: DC

## 2017-01-06 NOTE — Addendum Note (Signed)
Addended by: [name] on: 01/06/2017 12:15 PM   Modules accepted: Orders

## 2017-01-09 DIAGNOSIS — H35033 Hypertensive retinopathy, bilateral: Secondary | ICD-10-CM | POA: Diagnosis not present

## 2017-01-09 DIAGNOSIS — Z961 Presence of intraocular lens: Secondary | ICD-10-CM | POA: Diagnosis not present

## 2017-01-09 DIAGNOSIS — H353132 Nonexudative age-related macular degeneration, bilateral, intermediate dry stage: Secondary | ICD-10-CM | POA: Diagnosis not present

## 2017-01-09 DIAGNOSIS — E113393 Type 2 diabetes mellitus with moderate nonproliferative diabetic retinopathy without macular edema, bilateral: Secondary | ICD-10-CM | POA: Diagnosis not present

## 2017-01-09 LAB — HM DIABETES EYE EXAM

## 2017-01-10 DIAGNOSIS — N184 Chronic kidney disease, stage 4 (severe): Secondary | ICD-10-CM | POA: Diagnosis not present

## 2017-01-10 DIAGNOSIS — N2581 Secondary hyperparathyroidism of renal origin: Secondary | ICD-10-CM | POA: Diagnosis not present

## 2017-01-10 DIAGNOSIS — I129 Hypertensive chronic kidney disease with stage 1 through stage 4 chronic kidney disease, or unspecified chronic kidney disease: Secondary | ICD-10-CM | POA: Diagnosis not present

## 2017-01-10 DIAGNOSIS — C9190 Lymphoid leukemia, unspecified not having achieved remission: Secondary | ICD-10-CM | POA: Diagnosis not present

## 2017-01-11 DIAGNOSIS — Z6831 Body mass index (BMI) 31.0-31.9, adult: Secondary | ICD-10-CM | POA: Diagnosis not present

## 2017-01-11 DIAGNOSIS — Z01419 Encounter for gynecological examination (general) (routine) without abnormal findings: Secondary | ICD-10-CM | POA: Diagnosis not present

## 2017-01-12 LAB — ACID FAST CULTURE WITH REFLEXED SENSITIVITIES (MYCOBACTERIA)
Acid Fast Culture: NEGATIVE
Acid Fast Culture: NEGATIVE

## 2017-01-18 ENCOUNTER — Other Ambulatory Visit: Payer: Self-pay | Admitting: Family Medicine

## 2017-01-19 NOTE — Progress Notes (Signed)
[facility]  PATIENT: Christine Santos DOB: [birth year]   REASON FOR VISIT: Follow-up for lacunar infarct, atrial fibrillation HISTORY FROM: Patient , husband and sister Onalee Hua    HISTORY OF PRESENT ILLNESS:UPDATE 12/28/2018CM Christine Santos, 71 year old female returns for follow-up with history of lacunar  infarct in August 2017.  She had a loop recorder placed in atrial fibrillation was identified in September 2017.  She is currently on Eliquis for secondary stroke prevention and atrial fibrillation.  She has not had further stroke or TIA symptoms.  She has minimal bruising and no bleeding.  Blood pressure in the office today 140/82.  Most recent hemoglobin A1c on 12/13/2016 was 6.2.  He had hospital admission November 02/2016  for community-acquired pneumonia and was hospitalized for 12 days.  She is currently getting  physical therapy and ambulating with a rolling walker.  After her hospitalization she had follow-up with her primary care who took her off of Lipitor but she says that is to resume at her next visit in several weeks.  She has some lower extremity swelling and is wearing compression hose.  She also has elevated creatinine.  She does not have new neurologic complaints.  She returns for reevaluation  UPDATE 04/23/2018CM  Christine Santos, 71 year old female returns for follow-up after admission in August 2017 for stroke. 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 MRA of the brain showed no significant large vessel stenosis or occlusion. Carotid Doppler no significant extracranial stenosis. She is currently on eliquis for secondary stroke prevention and atrial fibrillation.  She has no bruising or bleeding. She remains on Lipitor for hyperlipidemia without complaints of myalgias. CBGs this morning 84. Most recent hemoglobin A1c 6. [place] out [place] and tripped and broke her right elbow. Her right arm is in a sling. Blood  pressure in the office today 150/68. She returns for reevaluation   HISTORY 11/16/15 PS Ms Schreiter is a pleasant 71 year old Caucasian lady seen today for first office follow-up visit for hospital admission for stroke in August 2017. She is a complaint today by her sister.Christine Vessell Rothis an 47 y.o.femalewith history of CAD (now on ASA, plavix), CLL, CHF, HTN, HLD, IDDM, GERD who presents to the ED for evaluation of blurred vision/double vision and dizziness. She states yesterday at 1000 she noted she felt dizzy but not vertigo. She thought it was her blood sugar and took a glucose pill. This persisted and at 1600 she noted horizontal diplopia and felt off balance due to her vision. Due to not returning to baseline she came to ED today. She had diplopia on upward and horizontal gaze and her husband states her left eye has a new ptosis. BP is 166/93 which was elevated more than normal. She stated she took her antiplatelets every day. In addition she was  bradycardic while in ED with pulse of 42. She was last known well 09/03/2015 at 10 AM. Patient was not administered IV t-PA secondary to being outside of the window. Shewas admitted for further evaluation and treatment. MRI scan of the brain showed left midbrain lacunar infarct due to small vessel disease. MRA of the brain showed no significant large vessel stenosis or occlusion. Carotid ultrasound showed no significant extracranial stenosis transversely echo showed normal ejection fraction. LDL cholesterol is mildly elevated 99 mg percent. Hemoglobin A1c in June 2017 was 6.8. Patient was enrolled into the stroke atrial fibrillation trial and was randomized into the loop  recorder insertion. She was found to have paroxysmal atrial fibrillation in September 2017 and change from antiplatelet agent to eliquis. She is tolerating it well without bleeding or bruising. She states her blood pressure is usually well controlled but today it is elevated at 160/80. Her  fasting sugars are usually in the 90-100 range. She has no new complaints today. She does have lower extremity swelling and I recommend she discuss with her primary physician switching Norvasc to alternative blood pressure agent.    REVIEW OF SYSTEMS: Full 14 system review of systems performed and notable only for those listed, all others are neg:  Constitutional: Fatigue Cardiovascular: Leg swelling Ear/Nose/Throat: neg  Skin: neg Eyes: neg Respiratory: neg Gastroitestinal: neg  Hematology/Lymphatic: neg  Endocrine: neg Musculoskeletal gait abnormality Allergy/Immunology: neg Neurological: neg Psychiatric: neg Sleep : neg   ALLERGIES: Allergies  Allergen Reactions  . Other     Darvocet  . Penicillins   . Amoxicillin Rash    Has patient had a PCN reaction causing immediate rash, facial/tongue/throat swelling, SOB or lightheadedness with hypotension: No Has patient had a PCN reaction causing severe rash involving mucus membranes or skin necrosis: No Has patient had a PCN reaction that required hospitalization: No Has patient had a PCN reaction occurring within the last 10 years: No If all of the above answers are "NO", then may proceed with Cephalosporin use.   . Cephalexin Rash  . Codeine Phosphate Rash    HOME MEDICATIONS: Outpatient Medications Prior to Visit  Medication Sig Dispense Refill  . allopurinol (ZYLOPRIM) 100 MG tablet TAKE 1 TABLET(100 MG) BY MOUTH DAILY 90 tablet 3  . Ascorbic Acid (VITAMIN C) 100 MG tablet Take 100 mg by mouth daily.    . carboxymethylcellulose (REFRESH PLUS) 0.5 % SOLN 1 drop 3 (three) times daily as needed.    Marland Kitchen ELIQUIS 5 MG TABS tablet TAKE 1 TABLET(5 MG) BY MOUTH TWICE DAILY 60 tablet 11  . ferrous sulfate 325 (65 FE) MG tablet Take 1 tablet (325 mg total) by mouth daily with breakfast.  3  . GNP GARLIC EXTRACT PO Take 1 tablet by mouth daily.     Marland Kitchen HUMALOG KWIKPEN 100 UNIT/ML KiwkPen Inject 5 Units as directed 3 (three) times  daily.  3  . HUMALOG KWIKPEN 100 UNIT/ML KiwkPen ADMINISTER 5 UNITS UNDER THE SKIN THREE TIMES DAILY BEFORE MEALS 15 mL 0  . hydrALAZINE (APRESOLINE) 50 MG tablet Take 1 tablet (50 mg total) by mouth 3 (three) times daily. 90 tablet 9  . insulin glargine (LANTUS) 100 UNIT/ML injection Inject 0.16 mLs (16 Units total) at bedtime into the skin. 10 mL 11  . LANTUS SOLOSTAR 100 UNIT/ML Solostar Pen ADMINISTER 35 UNITS UNDER THE SKIN DAILY 15 mL 6  . Melatonin 10 MG CAPS Take 10 mg by mouth at bedtime as needed (sleep).     . metoprolol tartrate (LOPRESSOR) 25 MG tablet TAKE 1 TABLET BY MOUTH TWICE DAILY 180 tablet 3  . Multiple Vitamins-Minerals (EYE VITAMINS PO) Take by mouth. Taking one daily from her opthamologist (Dr. Herbert Deaner)    . Multiple Vitamins-Minerals (MULTIVITAMINS THER. W/MINERALS) TABS Take 1 tablet by mouth daily.      Marland Kitchen omega-3 acid ethyl esters (LOVAZA) 1 g capsule Take 2 g by mouth 2 (two) times daily.     . pantoprazole (PROTONIX) 40 MG tablet TAKE 1 TABLET BY MOUTH DAILY (Patient taking differently: TAKE 40 mh TABLET BY MOUTH DAILY) 90 tablet 3  . PARoxetine (PAXIL) 20 MG  tablet TAKE 1 TABLET BY MOUTH EVERY MORNING (Patient taking differently: TAKE 20 mg TABLET BY MOUTH EVERY MORNING) 90 tablet 3  . torsemide (DEMADEX) 100 MG tablet Take 1.5 tablets (150 mg total) by mouth daily. 135 tablet 3  . traMADol (ULTRAM) 50 MG tablet TAKE 1 TABLET BY MOUTH FOUR TIMES DAILY (Patient taking differently: TAKE 50 mg TABLET BY MOUTH FOUR TIMES DAILY) 360 tablet 1   No facility-administered medications prior to visit.     PAST MEDICAL HISTORY: Past Medical History:  Diagnosis Date  . Anemia 08/29/2011  . Anxiety   . Arthritis    "legs" (01/22/2013)  . CAD (coronary artery disease)    s/p CABG in 1999  . Cataract   . Chronic kidney disease   . CLL (chronic lymphoblastic leukemia)   . Diastolic heart failure ([place])   . GERD (gastroesophageal reflux disease)   . H/O hiatal hernia   .  Heart murmur   . Hyperlipidemia   . Hypertension   . IDDM (insulin dependent diabetes mellitus) ([place])   . PAD (peripheral artery disease) ([place])   . Paroxysmal atrial fibrillation (HCC)    a. identified on ILR as part of StrokeAF study  . Peripheral vascular disease ([place])   . Stroke (cerebrum) ([place])     PAST SURGICAL HISTORY: Past Surgical History:  Procedure Laterality Date  . ANGIOPLASTY / STENTING FEMORAL Right 09/2010   SFA/notes 11/25/2010 (01/22/2013)  . ANGIOPLASTY / STENTING FEMORAL Left 11/2010   SFA/notes 11/25/2010 (01/22/2013)  . ANGIOPLASTY / STENTING ILIAC     [name] 11/25/2010 (01/22/2013)  . CATARACT EXTRACTION W/ INTRAOCULAR LENS  IMPLANT, BILATERAL Bilateral ?2011  . CHOLECYSTECTOMY  1993  . CORONARY ARTERY BYPASS GRAFT  10/1994   "CABG X3"  . EP IMPLANTABLE DEVICE N/A 09/11/2015   Procedure: Loop Recorder Insertion;  Surgeon: Thompson Grayer, MD;  Location: [facility];  Service: Cardiovascular;  Laterality: N/A;  . HEEL SPUR EXCISION Bilateral 1970's  . LOWER EXTREMITY ANGIOGRAM N/A 01/22/2013   Procedure: LOWER EXTREMITY ANGIOGRAM;  Surgeon: [name], MD;  Location: [facility] CATH LAB;  Service: Cardiovascular;  Laterality: N/A;  . SHOULDER OPEN ROTATOR CUFF REPAIR Bilateral 1990's   "2 times on 1 side; once on the other"  . TRANSLUMINAL ATHERECTOMY FEMORAL ARTERY Right 01/22/2013   & balloon  . TUBAL LIGATION  1980's  . VIDEO ASSISTED THORACOSCOPY (VATS)/EMPYEMA Left 11/29/2016   Procedure: VIDEO ASSISTED THORACOSCOPY (VATS)/EMPYEMA;  Surgeon: Prescott Gum, Collier Salina, MD;  Location: [facility];  Service: Thoracic;  Laterality: Left;  VIDEO ASSISTED THORACOSCOPY (VATS)/EMPYEMA    FAMILY HISTORY: Family History  Problem Relation Age of Onset  . Heart disease Mother   . Heart attack Mother   . Hypertension Mother   . Kidney disease Mother   . Pneumonia Father   . Heart attack Father   . Hyperlipidemia Father   . Diabetes Sister   . Breast cancer Sister   .  Arthritis Brother   . Diabetes Brother   . Heart attack Sister   . Obesity Sister   . Hypertension Sister   . Heart attack Sister   . Cancer Sister        breast  . Hyperlipidemia Sister   . Hypertension Sister   . Hyperlipidemia Sister   . Breast cancer Maternal Grandmother     SOCIAL HISTORY: Social History   Socioeconomic History  . Marital status: Divorced    Spouse name: Not on file  . Number of  children: 2  . Years of education: 9  . Highest education level: Not on file  Social Needs  . Financial resource strain: Not on file  . Food insecurity - worry: Not on file  . Food insecurity - inability: Not on file  . Transportation needs - medical: Not on file  . Transportation needs - non-medical: Not on file  Occupational History  . Occupation: DISABILITY    Employer: OTHER    Comment: Worked at [employer]  . Smoking status: Never Smoker  . Smokeless tobacco: Never Used  Substance and Sexual Activity  . Alcohol use: No  . Drug use: No  . Sexual activity: Not Currently  Other Topics Concern  . Not on file  Social History Narrative   Health Care POA:    Emergency Contact: sister, [name] (h) [phone]   End of Life Plan:    Who lives with you: female friend   Any pets: none   Diet: Pt has a varied diet of protein, starch, vegetables   Exercise: Pt does not have any regular exercise routine.   Seatbelts: Pt reports wearing seatbelt when in vehicles.    Sun Exposure/Protection: Pt reports using sun screen.   Hobbies: going to lake, biking, blue grass music      Current Social History 08/16/2016        Patient lives with [name] (Significant other) in one level home 08/16/2016   Transportation: Patient has own vehicle 08/16/2016   Important Relationships [name] and sisters, [name] and Vaughan Basta 08/16/2016    Pets: None 08/16/2016   Education / Work:  12 th grade/ Disabled ([employer]) 08/16/2016   Interests / Fun: Going to [facility], out to  eat with friends 08/16/2016   Current Stressors: Bilat foot pain (neuropathy) 08/16/2016   Religious / Personal Beliefs: Raised [employer], goes to [employer] 08/16/2016   L. Ducatte, RN, BSN                                                                                                             PHYSICAL EXAM  Vitals:   01/20/17 1109  BP: (!) 140/82   Pulse: 88  Weight: 181 lb 12.8 oz (82.5 kg)  Height: 5\' 5"  (1.651 m)   Body mass index is 30.25 kg/m.  Generalized: Well developed, in no acute distress  Head: normocephalic and atraumatic,. Oropharynx benign  Neck: Supple, no carotid bruits  Cardiac: Regular rate rhythm, no murmur  Musculoskeletal: No deformity   Neurological examination   Mentation: Alert oriented to time, place, history taking. Attention span and concentration appropriate. Recent and remote memory intact.  Follows all commands speech and language fluent.   Cranial nerve II-XII: Pupils were equal round reactive to light extraocular movements were full, visual field were full on confrontational test. Facial sensation and strength were normal. hearing was intact to finger rubbing bilaterally. Uvula tongue midline. head turning and shoulder shrug were normal and symmetric.Tongue protrusion into cheek strength was normal. Motor: normal bulk and tone,  full strength in the BUE, BLE, Sensory: normal and symmetric to light touch, pinprick, and  Vibration in the upper and lower extremities  Coordination finger-nose-finger, heel-to-shin bilaterally, no dysmetria Reflexes:  1+ upper lower and symmetric, plantar responses were flexor bilaterally. Gait and Station: Rising up from seated position without assistance, wide-based  stance, slow steady gait with rolling walker  DIAGNOSTIC DATA (LABS, IMAGING, TESTING) - I reviewed patient records, labs, notes, testing and imaging myself where available.  Lab Results  Component Value Date   WBC 13.4 (H) 12/13/2016   HGB  8.1 (L) 12/13/2016   HCT 25.5 (L) 12/13/2016   MCV 98 (H) 12/13/2016   PLT 248 12/13/2016      Component Value Date/Time   NA 143 01/05/2017 1146   NA 143 09/06/2016 1240   K 4.8 01/05/2017 1146   K 4.0 09/06/2016 1240   CL 101 01/05/2017 1146   CL 110 (H) 05/28/2012 1259   CO2 28 01/05/2017 1146   CO2 28 09/06/2016 1240   GLUCOSE 146 (H) 01/05/2017 1146   GLUCOSE 235 (H) 12/07/2016 0707   GLUCOSE 70 09/06/2016 1240   GLUCOSE 202 (H) 05/28/2012 1259   BUN 52 (H) 01/05/2017 1146   BUN 60.4 (H) 09/06/2016 1240   CREATININE 2.23 (H) 01/05/2017 1146   CREATININE 3.4 (HH) 09/06/2016 1240   CALCIUM 9.0 01/05/2017 1146   CALCIUM 9.4 09/06/2016 1240   PROT 4.9 (L) 12/20/2016 1032   PROT 5.6 (L) 09/06/2016 1240   ALBUMIN 3.3 (L) 12/20/2016 1032   ALBUMIN 3.0 (L) 09/06/2016 1240   AST 357 (H) 12/20/2016 1032   AST 29 09/06/2016 1240   ALT 238 (H) 12/20/2016 1032   ALT 23 09/06/2016 1240   ALKPHOS 128 (H) 12/20/2016 1032   ALKPHOS 99 09/06/2016 1240   BILITOT 0.2 12/20/2016 1032   BILITOT 0.32 09/06/2016 1240   GFRNONAA 22 (L) 01/05/2017 1146   GFRNONAA 14 (L) 11/25/2015 1402   GFRAA 25 (L) 01/05/2017 1146   GFRAA 16 (L) 11/25/2015 1402   Lab Results  Component Value Date   CHOL 169 09/04/2015   HDL 32 (L) 09/04/2015   LDLCALC 99 09/04/2015   TRIG 188 (H) 09/04/2015   CHOLHDL 5.3 09/04/2015   Lab Results  Component Value Date   HGBA1C 6.2 12/13/2016    Lab Results  Component Value Date   TSH 3.397 09/04/2015      ASSESSMENT AND PLAN 24 year Caucasian lady with small left midbrain lacunar infarct in August 2017 due to small vessel disease. She participated in the stroke atrial fibrillation trial and was randomized into the loop recorder insertion arm and was subsequently found to have paroxysmal atrial fibrillation. Multiple vascular risk factors of diabetes, hypertension, hyperlipidemia and paroxysmal   atrial fibrillation.  She is stable from a neurovascular  standpoint. The patient is a current patient of Dr. Leonie Man who is out of the office today . This note is sent to the work in doctor.     PLAN: Stressed the importance of management of risk factors to prevent further stroke Continue eliquis for secondary stroke prevention and atrial fibrillation Maintain strict control of hypertension with blood pressure goal below 130/90, today's reading 140/82,  continue antihypertensive medications Control of diabetes with hemoglobin A1c below 6.5 followed by primary care most recent hemoglobin A1c 6.2  On 12/13/16 continue diabetic medications Cholesterol with LDL cholesterol less than 70, followed by primary care,  Patient currently off Lipitor, stopped by Dr. Andria Frames PCP after  recent hospitalization for pneumonia Continue rehab and home exercise program.  Continue to use walker for safe ambulation  eat healthy diet with whole grains,  fresh fruits and vegetables Discharge from stroke service I spent 25 minutes in total face to face time with the patient more than 50% of which was spent counseling and coordination of care, reviewing test results reviewing medications and discussing and reviewing the diagnosis of stroke and management of risk factors.  Written information given and reviewed with patient/husband Dennie Bible, [facility], [facility], APRN  [facility] [address], [place], [place] [address] [phone]

## 2017-01-20 ENCOUNTER — Ambulatory Visit: Payer: Medicare Other | Admitting: Nurse Practitioner

## 2017-01-20 ENCOUNTER — Encounter: Payer: Self-pay | Admitting: Nurse Practitioner

## 2017-01-20 VITALS — BP 140/82 | HR 88 | Ht 65.0 in | Wt 181.8 lb

## 2017-01-20 DIAGNOSIS — I480 Paroxysmal atrial fibrillation: Secondary | ICD-10-CM

## 2017-01-20 DIAGNOSIS — Z8673 Personal history of transient ischemic attack (TIA), and cerebral infarction without residual deficits: Secondary | ICD-10-CM | POA: Diagnosis not present

## 2017-01-20 DIAGNOSIS — I10 Essential (primary) hypertension: Secondary | ICD-10-CM | POA: Diagnosis not present

## 2017-01-20 NOTE — Patient Instructions (Addendum)
Stressed the importance of management of risk factors to prevent further stroke Continue eliquis for secondary stroke prevention and atrial fibrillation Maintain strict control of hypertension with blood pressure goal below 130/90, today's reading 140/82,  continue antihypertensive medications Control of diabetes with hemoglobin A1c below 6.5 followed by primary care most recent hemoglobin A1c 6.2 continue diabetic medications Cholesterol with LDL cholesterol less than 70, followed by primary care,  Patient currently off Lipitor, stopped by PCP after recent hospitalization for pneumonia Continue rehab and home exercise program.  Continue to use walker for safe ambulation  eat healthy diet with whole grains,  fresh fruits and vegetables Discharge from stroke service  Stroke Prevention Some health problems and behaviors may make it more likely for you to have a stroke. Below are ways to lessen your risk of having a stroke.  Be active for at least 30 minutes on most or all days.  Do not smoke. Try not to be around others who smoke.  Do not drink too much alcohol. ? Do not have more than 2 drinks a day if you are a man. ? Do not have more than 1 drink a day if you are a woman and are not pregnant.  Eat healthy foods, such as fruits and vegetables. If you were put on a specific diet, follow the diet as told.  Keep your cholesterol levels under control through diet and medicines. Look for foods that are low in saturated fat, trans fat, cholesterol, and are high in fiber.  If you have diabetes, follow all diet plans and take your medicine as told.  Ask your doctor if you need treatment to lower your blood pressure. If you have high blood pressure (hypertension), follow all diet plans and take your medicine as told by your doctor.  If you are 35-15 years old, have your blood pressure checked every 3-5 years. If you are age 6 or older, have your blood pressure checked every year.  Keep a  healthy weight. Eat foods that are low in calories, salt, saturated fat, trans fat, and cholesterol.  Do not take drugs.  Avoid birth control pills, if this applies. Talk to your doctor about the risks of taking birth control pills.  Talk to your doctor if you have sleep problems (sleep apnea).  Take all medicine as told by your doctor. ? You may be told to take aspirin or blood thinner medicine. Take this medicine as told by your doctor. ? Understand your medicine instructions.  Make sure any other conditions you have are being taken care of.  Get help right away if:  You suddenly lose feeling (you feel numb) or have weakness in your face, arm, or leg.  Your face or eyelid hangs down to one side.  You suddenly feel confused.  You have trouble talking (aphasia) or understanding what people are saying.  You suddenly have trouble seeing in one or both eyes.  You suddenly have trouble walking.  You are dizzy.  You lose your balance or your movements are clumsy (uncoordinated).  You suddenly have a very bad headache and you do not know the cause.  You have new chest pain.  Your heart feels like it is fluttering or skipping a beat (irregular heartbeat). Do not wait to see if the symptoms above go away. Get help right away. Call your local emergency services (911 in [place] Do not drive yourself to the hospital. This information is not intended to replace advice given to you by your  health care provider. Make sure you discuss any questions you have with your health care provider. Document Released: 07/12/2011 Document Revised: 06/18/2015 Document Reviewed: 07/13/2012 Elsevier Interactive Patient Education  [employer].

## 2017-01-25 ENCOUNTER — Other Ambulatory Visit: Payer: Self-pay

## 2017-01-25 ENCOUNTER — Ambulatory Visit: Payer: Medicare Other | Admitting: Family Medicine

## 2017-01-25 ENCOUNTER — Telehealth: Payer: Self-pay | Admitting: *Deleted

## 2017-01-25 ENCOUNTER — Encounter: Payer: Self-pay | Admitting: Family Medicine

## 2017-01-25 DIAGNOSIS — N184 Chronic kidney disease, stage 4 (severe): Secondary | ICD-10-CM

## 2017-01-25 DIAGNOSIS — I5022 Chronic systolic (congestive) heart failure: Secondary | ICD-10-CM

## 2017-01-25 DIAGNOSIS — L03119 Cellulitis of unspecified part of limb: Secondary | ICD-10-CM | POA: Insufficient documentation

## 2017-01-25 DIAGNOSIS — L02419 Cutaneous abscess of limb, unspecified: Secondary | ICD-10-CM

## 2017-01-25 DIAGNOSIS — R5381 Other malaise: Secondary | ICD-10-CM | POA: Diagnosis not present

## 2017-01-25 MED ORDER — DOXYCYCLINE HYCLATE 100 MG PO TABS: 100.0000 mg | ORAL_TABLET | Freq: Two times a day (BID) | ORAL | 0 refills | Status: DC

## 2017-01-25 NOTE — Telephone Encounter (Signed)
Sumner Boast with [facility] called stating that patient has a stage 2 decubitis ulcer on her coccyx and also a place on her right posterior lower leg that they would like to have get a verbal order to have wound care assess it.  They have wound care protocol and this would allow them to have their team assess it and then give their recommendations on what care if needed.  Will forward to MD to advise.  She can be reached at [phone].  Infiniti Hoefling,CMA

## 2017-01-25 NOTE — Assessment & Plan Note (Signed)
Stable but STILL not at dry wt.  Repeat creat and possibly adjust diuretic dosing.

## 2017-01-25 NOTE — Progress Notes (Signed)
   Subjective:    Patient ID: Christine Santos, female    DOB: 1945-08-24, 72 y.o.   MRN: [id number]  HPI Major issues: 1. CKD and CHF.  Still with sig edema despite torsemide 150 mg daily.  Wt unchanged.  Last creat was great for her.  Less SOB.  Legs still heavy. 2. Deconditioning.  Working with PT.  Getting stronger but it is very slow. 3. Right leg blistering and redness.  No fever.  Right leg more painful than left.     Review of Systems     Objective:   Physical Exam Lungs clear Cardiac RRR without m or g Abd benign Ext bilateral 2-3+ edema.  Right calf skin red and tender with blistering.       Assessment & Plan:

## 2017-01-25 NOTE — Telephone Encounter (Signed)
Called and verbal order given

## 2017-01-25 NOTE — Assessment & Plan Note (Signed)
-- 

## 2017-01-25 NOTE — Patient Instructions (Signed)
Let me know if you don't hear from the PT folks soon. No driving until you can get in and out of the car easily on your own. I will call tomorrow with blood test results and decide what to do about fluid pill dosing. See me in 3-4 weeks.

## 2017-01-25 NOTE — Assessment & Plan Note (Signed)
Early cellulitis.  Doxy.  Wound care.

## 2017-01-26 DIAGNOSIS — M109 Gout, unspecified: Secondary | ICD-10-CM | POA: Diagnosis not present

## 2017-01-26 DIAGNOSIS — E1142 Type 2 diabetes mellitus with diabetic polyneuropathy: Secondary | ICD-10-CM | POA: Diagnosis not present

## 2017-01-26 DIAGNOSIS — I480 Paroxysmal atrial fibrillation: Secondary | ICD-10-CM | POA: Diagnosis not present

## 2017-01-26 DIAGNOSIS — N184 Chronic kidney disease, stage 4 (severe): Secondary | ICD-10-CM | POA: Diagnosis not present

## 2017-01-26 DIAGNOSIS — I130 Hypertensive heart and chronic kidney disease with heart failure and stage 1 through stage 4 chronic kidney disease, or unspecified chronic kidney disease: Secondary | ICD-10-CM | POA: Diagnosis not present

## 2017-01-26 DIAGNOSIS — I5032 Chronic diastolic (congestive) heart failure: Secondary | ICD-10-CM | POA: Diagnosis not present

## 2017-01-26 DIAGNOSIS — E1122 Type 2 diabetes mellitus with diabetic chronic kidney disease: Secondary | ICD-10-CM | POA: Diagnosis not present

## 2017-01-26 DIAGNOSIS — F419 Anxiety disorder, unspecified: Secondary | ICD-10-CM | POA: Diagnosis not present

## 2017-01-26 DIAGNOSIS — J189 Pneumonia, unspecified organism: Secondary | ICD-10-CM | POA: Diagnosis not present

## 2017-01-26 DIAGNOSIS — E1151 Type 2 diabetes mellitus with diabetic peripheral angiopathy without gangrene: Secondary | ICD-10-CM | POA: Diagnosis not present

## 2017-01-26 DIAGNOSIS — C9111 Chronic lymphocytic leukemia of B-cell type in remission: Secondary | ICD-10-CM | POA: Diagnosis not present

## 2017-01-26 DIAGNOSIS — D631 Anemia in chronic kidney disease: Secondary | ICD-10-CM | POA: Diagnosis not present

## 2017-01-26 DIAGNOSIS — I2510 Atherosclerotic heart disease of native coronary artery without angina pectoris: Secondary | ICD-10-CM | POA: Diagnosis not present

## 2017-01-26 LAB — BASIC METABOLIC PANEL
BUN/Creatinine Ratio: 23 (ref 12–28)
BUN: 67 mg/dL — AB (ref 8–27)
CALCIUM: 9.3 mg/dL (ref 8.7–10.3)
CO2: 25 mmol/L (ref 20–29)
Chloride: 102 mmol/L (ref 96–106)
Creatinine, Ser: 2.94 mg/dL — ABNORMAL HIGH (ref 0.57–1.00)
GFR calc Af Amer: 18 mL/min/{1.73_m2} — ABNORMAL LOW (ref >59)
GFR, EST NON AFRICAN AMERICAN: 15 mL/min/{1.73_m2} — AB (ref >59)
Glucose: 124 mg/dL — ABNORMAL HIGH (ref 65–99)
POTASSIUM: 5.1 mmol/L (ref 3.5–5.2)
Sodium: 146 mmol/L — ABNORMAL HIGH (ref 134–144)

## 2017-01-31 ENCOUNTER — Telehealth: Payer: Self-pay | Admitting: *Deleted

## 2017-01-31 NOTE — Telephone Encounter (Signed)
I will examine next visit.

## 2017-01-31 NOTE — Telephone Encounter (Signed)
Sharee Pimple, RN with [facility] called to report quarter size redness over the left greater trochanter. Area is swollen, warm to the touch and patient states "it hurts." Patient told Sharee Pimple it was there at last OV but she forgot to mention it. Patient denies fall or injury. Sharee Pimple feels it look like and infection. Sharee Pimple may be reached at [phone] for any questions or instructions. Hubbard Hartshorn, RN, BSN

## 2017-02-03 ENCOUNTER — Telehealth: Payer: Self-pay | Admitting: *Deleted

## 2017-02-03 NOTE — Telephone Encounter (Signed)
Ger[facility] PT [phone]) would like verbal orders to continue PT.  She would like 2x a week for 4 weeks. Sharnee Douglass, Salome Spotted, CMA

## 2017-02-07 ENCOUNTER — Telehealth: Payer: Self-pay | Admitting: *Deleted

## 2017-02-07 DIAGNOSIS — N813 Complete uterovaginal prolapse: Secondary | ICD-10-CM | POA: Diagnosis not present

## 2017-02-07 NOTE — Telephone Encounter (Signed)
Verbal order given as requested. 

## 2017-02-07 NOTE — Telephone Encounter (Signed)
Called and left message approving verbal order as requested.

## 2017-02-07 NOTE — Telephone Encounter (Signed)
Georgina Snell (nurse at [facility]) calling to request orders to see patient for 9 weeks for CHF education and recovery from pneumonia.  Will route request to PCP and call nurse back.  Burna Forts, BSN, RN-BC

## 2017-02-08 DIAGNOSIS — E1142 Type 2 diabetes mellitus with diabetic polyneuropathy: Secondary | ICD-10-CM | POA: Diagnosis not present

## 2017-02-08 DIAGNOSIS — L89152 Pressure ulcer of sacral region, stage 2: Secondary | ICD-10-CM | POA: Diagnosis not present

## 2017-02-08 DIAGNOSIS — I480 Paroxysmal atrial fibrillation: Secondary | ICD-10-CM | POA: Diagnosis not present

## 2017-02-08 DIAGNOSIS — I5032 Chronic diastolic (congestive) heart failure: Secondary | ICD-10-CM | POA: Diagnosis not present

## 2017-02-08 DIAGNOSIS — E11622 Type 2 diabetes mellitus with other skin ulcer: Secondary | ICD-10-CM | POA: Diagnosis not present

## 2017-02-08 DIAGNOSIS — E1151 Type 2 diabetes mellitus with diabetic peripheral angiopathy without gangrene: Secondary | ICD-10-CM | POA: Diagnosis not present

## 2017-02-08 DIAGNOSIS — D631 Anemia in chronic kidney disease: Secondary | ICD-10-CM | POA: Diagnosis not present

## 2017-02-08 DIAGNOSIS — E1122 Type 2 diabetes mellitus with diabetic chronic kidney disease: Secondary | ICD-10-CM | POA: Diagnosis not present

## 2017-02-08 DIAGNOSIS — C9111 Chronic lymphocytic leukemia of B-cell type in remission: Secondary | ICD-10-CM | POA: Diagnosis not present

## 2017-02-08 DIAGNOSIS — N184 Chronic kidney disease, stage 4 (severe): Secondary | ICD-10-CM | POA: Diagnosis not present

## 2017-02-08 DIAGNOSIS — I2510 Atherosclerotic heart disease of native coronary artery without angina pectoris: Secondary | ICD-10-CM | POA: Diagnosis not present

## 2017-02-08 DIAGNOSIS — I130 Hypertensive heart and chronic kidney disease with heart failure and stage 1 through stage 4 chronic kidney disease, or unspecified chronic kidney disease: Secondary | ICD-10-CM | POA: Diagnosis not present

## 2017-02-08 DIAGNOSIS — L97211 Non-pressure chronic ulcer of right calf limited to breakdown of skin: Secondary | ICD-10-CM | POA: Diagnosis not present

## 2017-02-13 ENCOUNTER — Other Ambulatory Visit: Payer: Self-pay | Admitting: *Deleted

## 2017-02-13 MED ORDER — HYDRALAZINE HCL 50 MG PO TABS: 50.0000 mg | ORAL_TABLET | Freq: Three times a day (TID) | ORAL | 1 refills | Status: DC

## 2017-02-16 ENCOUNTER — Other Ambulatory Visit: Payer: Self-pay

## 2017-02-16 ENCOUNTER — Encounter: Payer: Self-pay | Admitting: Family Medicine

## 2017-02-16 ENCOUNTER — Encounter: Payer: Medicare Other | Admitting: *Deleted

## 2017-02-16 ENCOUNTER — Ambulatory Visit
Admission: RE | Admit: 2017-02-16 | Discharge: 2017-02-16 | Disposition: A | Discharge status: Home / Self Care | Payer: Medicare Other | Source: Ambulatory Visit | Attending: Family Medicine | Admitting: Family Medicine

## 2017-02-16 ENCOUNTER — Ambulatory Visit: Payer: Medicare Other | Admitting: Family Medicine

## 2017-02-16 DIAGNOSIS — N184 Chronic kidney disease, stage 4 (severe): Secondary | ICD-10-CM

## 2017-02-16 DIAGNOSIS — M25552 Pain in left hip: Secondary | ICD-10-CM | POA: Diagnosis not present

## 2017-02-16 DIAGNOSIS — Z006 Encounter for examination for normal comparison and control in clinical research program: Secondary | ICD-10-CM

## 2017-02-16 DIAGNOSIS — I5022 Chronic systolic (congestive) heart failure: Secondary | ICD-10-CM

## 2017-02-16 DIAGNOSIS — M25551 Pain in right hip: Secondary | ICD-10-CM | POA: Insufficient documentation

## 2017-02-16 DIAGNOSIS — S79912A Unspecified injury of left hip, initial encounter: Secondary | ICD-10-CM | POA: Diagnosis not present

## 2017-02-16 MED ORDER — TORSEMIDE 100 MG PO TABS: 100.0000 mg | ORAL_TABLET | Freq: Every day | ORAL | 3 refills | Status: DC

## 2017-02-16 NOTE — Patient Instructions (Addendum)
Get the x ray done at your convenience.  Watch to see if lump grows.  You may end up needing an MRI of the hip. I will call with blood work. Cut your torsemide to one pill per day. See me in 3-4 weeks. Use neosporin on the calf.

## 2017-02-16 NOTE — Assessment & Plan Note (Signed)
Finally, nice progress.  Decrease torsemide and recheck bmp

## 2017-02-16 NOTE — Progress Notes (Signed)
   Subjective:    Patient ID: Christine Santos, female    DOB: 1945-01-27, 72 y.o.   MRN: [id number]  HPI  Finally, nice progress as she continues to recover from her near fatal pneumonia.  Issues 1. CHF.  Wt has dropped 15 lbs.  Finally diuresing.  Still with some leg swelling.  DOE is steadily improving. 2. CKD needs recheck. 3. Left hip lump and pain.  Fell in hospital  Soft landing so doubt fracture.   4. Still healing sore on right calf.    Review of Systems     Objective:   Physical Exam Wt and VS noted.  Lungs clear Cardiac RRR without m or g Ext 1+ edema.  Much improved. Right calf.  Well healing ulcer.   Left hip: firm lump behind greater trochanter of left hip.  Smooth, non mobile.      Assessment & Plan:

## 2017-02-16 NOTE — Assessment & Plan Note (Signed)
Definite firmness/fullness of post left hip.  Suspect benign soft tissue such as calcified hematoma.  Start with plain x ray and observe.  MRI if grows.

## 2017-02-16 NOTE — Assessment & Plan Note (Signed)
Recheck BMP.  Doubt over diuresis since still has peripheral edema.

## 2017-02-16 NOTE — Progress Notes (Addendum)
STROKE-AF Research study Month 18 follow up visit completed. Patient was in hospital last November with pneumonia. Not other issues or adverse events. Device interrogated. Pause episodes reviewed with [employer] (QRS complex noted) I will review with amber.  Patient is on eliquis and has known afib.

## 2017-02-17 DIAGNOSIS — N813 Complete uterovaginal prolapse: Secondary | ICD-10-CM | POA: Diagnosis not present

## 2017-02-17 LAB — BASIC METABOLIC PANEL
BUN/Creatinine Ratio: 25 (ref 12–28)
BUN: 66 mg/dL — ABNORMAL HIGH (ref 8–27)
CO2: 28 mmol/L (ref 20–29)
CREATININE: 2.62 mg/dL — AB (ref 0.57–1.00)
Calcium: 9.5 mg/dL (ref 8.7–10.3)
Chloride: 101 mmol/L (ref 96–106)
GFR calc Af Amer: 20 mL/min/{1.73_m2} — ABNORMAL LOW (ref >59)
GFR calc non Af Amer: 18 mL/min/{1.73_m2} — ABNORMAL LOW (ref >59)
GLUCOSE: 165 mg/dL — AB (ref 65–99)
POTASSIUM: 4.8 mmol/L (ref 3.5–5.2)
SODIUM: 147 mmol/L — AB (ref 134–144)

## 2017-02-20 ENCOUNTER — Other Ambulatory Visit: Payer: Self-pay | Admitting: Family Medicine

## 2017-02-20 DIAGNOSIS — M25552 Pain in left hip: Secondary | ICD-10-CM

## 2017-02-20 NOTE — Progress Notes (Signed)
Discussed lab and x ray results.  Hip lump causes pain, cannot sleep on that side.  Will go ahead with CT

## 2017-02-21 NOTE — Progress Notes (Signed)
Scheduled CT @ [facility] for 2/1/219 @ 2:30pm.

## 2017-02-23 ENCOUNTER — Encounter: Payer: Self-pay | Admitting: Family Medicine

## 2017-02-24 ENCOUNTER — Ambulatory Visit (HOSPITAL_COMMUNITY): Payer: Medicare Other

## 2017-02-28 ENCOUNTER — Ambulatory Visit (HOSPITAL_COMMUNITY)
Admission: RE | Admit: 2017-02-28 | Discharge: 2017-02-28 | Disposition: A | Discharge status: Home / Self Care | Payer: Medicare Other | Source: Ambulatory Visit | Attending: Family Medicine | Admitting: Family Medicine

## 2017-02-28 DIAGNOSIS — S79912A Unspecified injury of left hip, initial encounter: Secondary | ICD-10-CM | POA: Diagnosis not present

## 2017-02-28 DIAGNOSIS — K429 Umbilical hernia without obstruction or gangrene: Secondary | ICD-10-CM | POA: Diagnosis not present

## 2017-02-28 DIAGNOSIS — I700 Atherosclerosis of aorta: Secondary | ICD-10-CM | POA: Diagnosis not present

## 2017-02-28 DIAGNOSIS — M1612 Unilateral primary osteoarthritis, left hip: Secondary | ICD-10-CM | POA: Diagnosis not present

## 2017-02-28 DIAGNOSIS — M25552 Pain in left hip: Secondary | ICD-10-CM

## 2017-03-09 ENCOUNTER — Ambulatory Visit: Payer: Medicare Other | Admitting: Family Medicine

## 2017-03-09 ENCOUNTER — Other Ambulatory Visit: Payer: Self-pay

## 2017-03-09 ENCOUNTER — Encounter: Payer: Self-pay | Admitting: Family Medicine

## 2017-03-09 VITALS — BP 128/60 | HR 54 | Temp 98.1°F | Ht 65.0 in | Wt 162.6 lb

## 2017-03-09 DIAGNOSIS — I10 Essential (primary) hypertension: Secondary | ICD-10-CM

## 2017-03-09 DIAGNOSIS — N184 Chronic kidney disease, stage 4 (severe): Secondary | ICD-10-CM | POA: Diagnosis not present

## 2017-03-09 DIAGNOSIS — I5022 Chronic systolic (congestive) heart failure: Secondary | ICD-10-CM

## 2017-03-09 DIAGNOSIS — E118 Type 2 diabetes mellitus with unspecified complications: Secondary | ICD-10-CM

## 2017-03-09 NOTE — Progress Notes (Signed)
BMP

## 2017-03-09 NOTE — Patient Instructions (Signed)
OK to use your favorite topical rub on your hip.   I will call tomorrow with the blood test result Things are well, no change. See me in 2 months.  Of course, sooner if problems.  I do like the direction we are headed right now.

## 2017-03-10 ENCOUNTER — Encounter: Payer: Self-pay | Admitting: Family Medicine

## 2017-03-10 LAB — BASIC METABOLIC PANEL
BUN / CREAT RATIO: 23 (ref 12–28)
BUN: 64 mg/dL — AB (ref 8–27)
CHLORIDE: 101 mmol/L (ref 96–106)
CO2: 26 mmol/L (ref 20–29)
CREATININE: 2.76 mg/dL — AB (ref 0.57–1.00)
Calcium: 9.6 mg/dL (ref 8.7–10.3)
GFR calc Af Amer: 19 mL/min/{1.73_m2} — ABNORMAL LOW (ref >59)
GFR calc non Af Amer: 17 mL/min/{1.73_m2} — ABNORMAL LOW (ref >59)
GLUCOSE: 153 mg/dL — AB (ref 65–99)
Potassium: 4.6 mmol/L (ref 3.5–5.2)
SODIUM: 145 mmol/L — AB (ref 134–144)

## 2017-03-10 NOTE — Assessment & Plan Note (Signed)
Good control

## 2017-03-10 NOTE — Assessment & Plan Note (Signed)
DM control good based on home BS

## 2017-03-10 NOTE — Assessment & Plan Note (Signed)
Still with sig peripheral edema.  No central edema.  Keep torsemide dose as is.  I want to avoid over diuresis.

## 2017-03-10 NOTE — Progress Notes (Signed)
   Subjective:    Patient ID: Christine Santos, female    DOB: 1945-11-12, 72 y.o.   MRN: [id number]  HPI FU multiple chronic issues and debility from severe pneumonia.  We have finally turned the corner.  She is now done with physical therapy and able to do all her own ADLs.  She is not yet back to her pre pneumonia level of function, but she is improving.  CHF - Still with some, but less, leg edema.  She does have DOE, but hard to tell between CHF and deconditioning.  CKD - last visit, decreased torsemide to 10 mg daily.  Did not notice any worsening edema.  Diabetes: Control has improved with her loss of lean body wt (pneumonia is a tough weigh to lose weight).  Home BS running quite well  Skin breakdown of right calf healing well.    Review of Systems     Objective:   Physical Exam VS noted, good wt and BP Lungs clear Cardiac RRR without m or g.  No JVD Ext still 2+ edema bilaterally.  Skin breakdown of right calf has healed over nicely.        Assessment & Plan:

## 2017-03-10 NOTE — Assessment & Plan Note (Signed)
CKD seems to have returned to its pre pneumonia baseline.

## 2017-03-13 DIAGNOSIS — L929 Granulomatous disorder of the skin and subcutaneous tissue, unspecified: Secondary | ICD-10-CM | POA: Diagnosis not present

## 2017-03-13 DIAGNOSIS — N760 Acute vaginitis: Secondary | ICD-10-CM | POA: Diagnosis not present

## 2017-03-13 DIAGNOSIS — N8189 Other female genital prolapse: Secondary | ICD-10-CM | POA: Diagnosis not present

## 2017-03-29 DIAGNOSIS — N8189 Other female genital prolapse: Secondary | ICD-10-CM | POA: Diagnosis not present

## 2017-04-11 ENCOUNTER — Encounter (HOSPITAL_COMMUNITY): Payer: Self-pay | Admitting: Emergency Medicine

## 2017-04-11 ENCOUNTER — Emergency Department (HOSPITAL_COMMUNITY): Payer: Medicare Other

## 2017-04-11 DIAGNOSIS — I2510 Atherosclerotic heart disease of native coronary artery without angina pectoris: Secondary | ICD-10-CM | POA: Diagnosis not present

## 2017-04-11 DIAGNOSIS — E1122 Type 2 diabetes mellitus with diabetic chronic kidney disease: Secondary | ICD-10-CM | POA: Diagnosis not present

## 2017-04-11 DIAGNOSIS — N184 Chronic kidney disease, stage 4 (severe): Secondary | ICD-10-CM | POA: Insufficient documentation

## 2017-04-11 DIAGNOSIS — Z794 Long term (current) use of insulin: Secondary | ICD-10-CM | POA: Insufficient documentation

## 2017-04-11 DIAGNOSIS — Z79899 Other long term (current) drug therapy: Secondary | ICD-10-CM | POA: Insufficient documentation

## 2017-04-11 DIAGNOSIS — J90 Pleural effusion, not elsewhere classified: Secondary | ICD-10-CM | POA: Diagnosis not present

## 2017-04-11 DIAGNOSIS — R112 Nausea with vomiting, unspecified: Secondary | ICD-10-CM | POA: Diagnosis not present

## 2017-04-11 DIAGNOSIS — I129 Hypertensive chronic kidney disease with stage 1 through stage 4 chronic kidney disease, or unspecified chronic kidney disease: Secondary | ICD-10-CM | POA: Insufficient documentation

## 2017-04-11 DIAGNOSIS — I10 Essential (primary) hypertension: Secondary | ICD-10-CM | POA: Diagnosis not present

## 2017-04-11 DIAGNOSIS — J111 Influenza due to unidentified influenza virus with other respiratory manifestations: Secondary | ICD-10-CM | POA: Diagnosis not present

## 2017-04-11 DIAGNOSIS — R509 Fever, unspecified: Secondary | ICD-10-CM | POA: Diagnosis not present

## 2017-04-11 LAB — COMPREHENSIVE METABOLIC PANEL
ALBUMIN: 3.2 g/dL — AB (ref 3.5–5.0)
ALK PHOS: 91 U/L (ref 38–126)
ALT: 18 U/L (ref 14–54)
ANION GAP: 12 (ref 5–15)
AST: 28 U/L (ref 15–41)
BILIRUBIN TOTAL: 0.7 mg/dL (ref 0.3–1.2)
BUN: 69 mg/dL — ABNORMAL HIGH (ref 6–20)
CALCIUM: 8.9 mg/dL (ref 8.9–10.3)
CO2: 27 mmol/L (ref 22–32)
CREATININE: 2.8 mg/dL — AB (ref 0.44–1.00)
Chloride: 100 mmol/L — ABNORMAL LOW (ref 101–111)
GFR calc Af Amer: 18 mL/min — ABNORMAL LOW (ref >60)
GFR calc non Af Amer: 16 mL/min — ABNORMAL LOW (ref >60)
GLUCOSE: 119 mg/dL — AB (ref 65–99)
Potassium: 3.3 mmol/L — ABNORMAL LOW (ref 3.5–5.1)
Sodium: 139 mmol/L (ref 135–145)
TOTAL PROTEIN: 5.8 g/dL — AB (ref 6.5–8.1)

## 2017-04-11 LAB — CBC WITH DIFFERENTIAL/PLATELET
BASOS ABS: 0 10*3/uL (ref 0.0–0.1)
BASOS PCT: 0 %
EOS ABS: 0.1 10*3/uL (ref 0.0–0.7)
EOS PCT: 1 %
HEMATOCRIT: 33.8 % — AB (ref 36.0–46.0)
Hemoglobin: 10.8 g/dL — ABNORMAL LOW (ref 12.0–15.0)
Lymphocytes Relative: 22 %
Lymphs Abs: 2.3 10*3/uL (ref 0.7–4.0)
MCH: 30.3 pg (ref 26.0–34.0)
MCHC: 32 g/dL (ref 30.0–36.0)
MCV: 94.9 fL (ref 78.0–100.0)
MONO ABS: 0.7 10*3/uL (ref 0.1–1.0)
MONOS PCT: 7 %
NEUTROS ABS: 7.5 10*3/uL (ref 1.7–7.7)
Neutrophils Relative %: 70 %
PLATELETS: 219 10*3/uL (ref 150–400)
RBC: 3.56 MIL/uL — ABNORMAL LOW (ref 3.87–5.11)
RDW: 15.9 % — AB (ref 11.5–15.5)
WBC: 10.5 10*3/uL (ref 4.0–10.5)

## 2017-04-11 LAB — URINALYSIS, ROUTINE W REFLEX MICROSCOPIC
BILIRUBIN URINE: NEGATIVE
Bacteria, UA: NONE SEEN
GLUCOSE, UA: NEGATIVE mg/dL
Hgb urine dipstick: NEGATIVE
KETONES UR: NEGATIVE mg/dL
NITRITE: NEGATIVE
PH: 7 (ref 5.0–8.0)
Protein, ur: 100 mg/dL — AB
Specific Gravity, Urine: 1.008 (ref 1.005–1.030)

## 2017-04-11 LAB — I-STAT CG4 LACTIC ACID, ED: Lactic Acid, Venous: 1.16 mmol/L (ref 0.5–1.9)

## 2017-04-11 NOTE — ED Triage Notes (Signed)
Pt reports flu like S/S since Sunday. Fever, chills, body aches, cough, N/V. Pt reports last dose of Tylenol was at 1930.

## 2017-04-12 ENCOUNTER — Emergency Department (HOSPITAL_COMMUNITY)
Admission: EM | Admit: 2017-04-12 | Discharge: 2017-04-12 | Disposition: A | Discharge status: Home / Self Care | Payer: Medicare Other | Attending: Emergency Medicine | Admitting: Emergency Medicine

## 2017-04-12 DIAGNOSIS — J111 Influenza due to unidentified influenza virus with other respiratory manifestations: Secondary | ICD-10-CM

## 2017-04-12 DIAGNOSIS — I10 Essential (primary) hypertension: Secondary | ICD-10-CM

## 2017-04-12 DIAGNOSIS — R69 Illness, unspecified: Secondary | ICD-10-CM

## 2017-04-12 LAB — INFLUENZA PANEL BY PCR (TYPE A & B)
Influenza A By PCR: NEGATIVE
Influenza B By PCR: NEGATIVE

## 2017-04-12 MED ORDER — SODIUM CHLORIDE 0.9 % IV BOLUS (SEPSIS)
500.0000 mL | Freq: Once | INTRAVENOUS | Status: AC
  Administered 2017-04-12: 500 mL via INTRAVENOUS

## 2017-04-12 MED ORDER — ONDANSETRON 4 MG PO TBDP: 4.0000 mg | ORAL_TABLET | Freq: Three times a day (TID) | ORAL | 0 refills | Status: DC | PRN

## 2017-04-12 MED ORDER — LEVOFLOXACIN 500 MG PO TABS: 500.0000 mg | ORAL_TABLET | Freq: Every day | ORAL | 0 refills | Status: DC

## 2017-04-12 MED ORDER — OSELTAMIVIR PHOSPHATE 75 MG PO CAPS: 75.0000 mg | ORAL_CAPSULE | Freq: Two times a day (BID) | ORAL | 0 refills | Status: DC

## 2017-04-12 NOTE — ED Provider Notes (Signed)
[facility] EMERGENCY DEPARTMENT Provider Note   CSN: [id number] Arrival date & time: 04/11/17  2238     History   Chief Complaint Chief Complaint  Patient presents with  . Fever    HPI Christine Santos is a 72 y.o. female.  HPI  This is a 72 year old female with a history of chronic kidney disease, coronary artery disease, hypertension, hyperlipidemia who presents with fever, chills, body aches, cough, nausea and vomiting.  Onset of symptoms on Sunday.  Patient reports fever of 102 earlier today.  No known sick contacts.  She did receive a flu vaccine.  Reports recent admission for pneumonia.  She denies any chest pain or shortness of breath.  She does report a productive cough.  She states "I just do not feel good."  She took over-the-counter Mucinex.  Denies any abdominal pain.  Past Medical History:  Diagnosis Date  . Anemia 08/29/2011  . Anxiety   . Arthritis    "legs" (01/22/2013)  . CAD (coronary artery disease)    s/p CABG in 1999  . Cataract   . Chronic kidney disease   . CLL (chronic lymphoblastic leukemia)   . Diastolic heart failure ([place])   . GERD (gastroesophageal reflux disease)   . H/O hiatal hernia   . Heart murmur   . Hyperlipidemia   . Hypertension   . IDDM (insulin dependent diabetes mellitus) ([place])   . PAD (peripheral artery disease) ([place])   . Paroxysmal atrial fibrillation (H[facility])    a. identified on ILR as part of StrokeAF study  . Peripheral vascular disease ([place])   . Stroke (cerebrum) [facility])     Patient Active Problem List   Diagnosis Date Noted  . Left hip pain 02/16/2017  . History of stroke 01/20/2017  . Transaminitis 12/23/2016  . Paroxysmal atrial fibrillation ([place]) 10/22/2015  . Sinusitis, chronic 08/05/2015  . Diabetes mellitus type 2 with neurological manifestations ([place]) 02/26/2014  . Hyperkalemia 06/21/2013  . Diabetic nephropathy ([place]) 05/23/2013  . Diabetic neuropathy ([place]) 11/30/2012  . Macular degeneration  07/04/2012  . Diabetic retinopathy ([place]) 03/20/2012  . Osteopenia 09/22/2011  . Anemia in chronic kidney disease 08/29/2011  . Gout 04/13/2011  . Stress incontinence, female 11/11/2010  . Anxiety 08/18/2010  . Chronic systolic heart failure ([place]) 02/18/2009  . SINUS BRADYCARDIA 10/29/2008  . Peripheral vascular disease ([place]) 06/18/2008  . Chronic kidney disease, stage IV (severe) ([place]) 07/13/2007  . Chronic lymphocytic leukemia ([place]) 10/13/2006  . 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 HYPERCHOLESTEROLEMIA 03/23/2006  . Essential hypertension 03/23/2006  . Coronary atherosclerosis 03/23/2006  . REFLUX ESOPHAGITIS 03/23/2006  . IRRITABLE BOWEL SYNDROME 03/23/2006  . CYSTOCELE/RECTOCELE/PROLAPSE,UNSPEC. 03/23/2006  . Osteoarthritis, multiple sites 03/23/2006    Past Surgical History:  Procedure Laterality Date  . ANGIOPLASTY / STENTING FEMORAL Right 09/2010   SFA/notes 11/25/2010 (01/22/2013)  . ANGIOPLASTY / STENTING FEMORAL Left 11/2010   SFA/notes 11/25/2010 (01/22/2013)  . ANGIOPLASTY / STENTING ILIAC     [name] 11/25/2010 (01/22/2013)  . CATARACT EXTRACTION W/ INTRAOCULAR LENS  IMPLANT, BILATERAL Bilateral ?2011  . CHOLECYSTECTOMY  1993  . CORONARY ARTERY BYPASS GRAFT  10/1994   "CABG X3"  . EP IMPLANTABLE DEVICE N/A 09/11/2015   Procedure: Loop Recorder Insertion;  Surgeon: [place] Grayer, MD;  Location: [facility] CV LAB;  Service: Cardiovascular;  Laterality: N/A;  . HEEL SPUR EXCISION Bilateral 1970's  . LOWER EXTREMITY ANGIOGRAM N/A 01/22/2013   Procedure: LOWER EXTREMITY ANGIOGRAM;  Surgeon:  [name], MD;  Location: [facility] CATH LAB;  Service: Cardiovascular;  Laterality: N/A;  . SHOULDER OPEN ROTATOR CUFF REPAIR Bilateral 1990's   "2 times on 1 side; once on the other"  . TRANSLUMINAL ATHERECTOMY FEMORAL ARTERY Right 01/22/2013   & balloon  . TUBAL LIGATION  1980's  . VIDEO ASSISTED THORACOSCOPY (VATS)/EMPYEMA Left 11/29/2016    Procedure: VIDEO ASSISTED THORACOSCOPY (VATS)/EMPYEMA;  Surgeon: [name], MD;  Location: [facility];  Service: Thoracic;  Laterality: Left;  VIDEO ASSISTED THORACOSCOPY (VATS)/EMPYEMA    OB History    No data available       Home Medications    Prior to Admission medications   Medication Sig Start Date End Date Taking? Authorizing Provider  allopurinol (ZYLOPRIM) 100 MG tablet TAKE 1 TABLET(100 MG) BY MOUTH DAILY 11/21/16   Hensel, Jamal Collin, MD  Ascorbic Acid (VITAMIN C) 100 MG tablet Take 100 mg by mouth daily.    [provider]  carboxymethylcellulose (REFRESH PLUS) 0.5 % SOLN 1 drop 3 (three) times daily as needed.    [provider]  ELIQUIS 5 MG TABS tablet TAKE 1 TABLET(5 MG) BY MOUTH TWICE DAILY 10/03/16   [name], MD  ferrous sulfate 325 (65 FE) MG tablet Take 1 tablet (325 mg total) by mouth daily with breakfast. 05/23/13   [name], MD  GNP GARLIC EXTRACT PO Take 1 tablet by mouth daily.     [provider]  HUMALOG KWIKPEN 100 UNIT/ML KiwkPen Inject 5 Units as directed 3 (three) times daily. 11/03/16   [provider]  HUMALOG KWIKPEN 100 UNIT/ML KiwkPen ADMINISTER 5 UNITS UNDER THE SKIN THREE TIMES DAILY BEFORE MEALS 01/18/17   Dickie La, MD  hydrALAZINE (APRESOLINE) 50 MG tablet Take 1 tablet (50 mg total) by mouth 3 (three) times daily. 02/13/17   [name], MD  insulin glargine (LANTUS) 100 UNIT/ML injection Inject 0.16 mLs (16 Units total) at bedtime into the skin. 12/07/16   Bonnita Hollow, MD  LANTUS SOLOSTAR 100 UNIT/ML Solostar Pen ADMINISTER 35 UNITS UNDER THE SKIN DAILY 12/09/16   [name], MD  levofloxacin (LEVAQUIN) 500 MG tablet Take 1 tablet (500 mg total) by mouth daily. 04/12/17   Horton, Barbette Hair, MD  Melatonin 10 MG CAPS Take 10 mg by mouth at bedtime as needed (sleep).     [provider]  metoprolol tartrate (LOPRESSOR) 25 MG tablet TAKE 1 TABLET BY MOUTH TWICE  DAILY 12/07/16   [name], MD  Multiple Vitamins-Minerals (EYE VITAMINS PO) Take by mouth. Taking one daily from her opthamologist (Dr. Herbert Deaner)    [provider]  Multiple Vitamins-Minerals (MULTIVITAMINS THER. W/MINERALS) TABS Take 1 tablet by mouth daily.      [provider]  omega-3 acid ethyl esters (LOVAZA) 1 g capsule Take 2 g by mouth 2 (two) times daily.     [provider]  ondansetron (ZOFRAN ODT) 4 MG disintegrating tablet Take 1 tablet (4 mg total) by mouth every 8 (eight) hours as needed for nausea or vomiting. 04/12/17   Horton, Barbette Hair, MD  oseltamivir (TAMIFLU) 75 MG capsule Take 1 capsule (75 mg total) by mouth every 12 (twelve) hours. 04/12/17   Horton, Barbette Hair, MD  pantoprazole (PROTONIX) 40 MG tablet TAKE 1 TABLET BY MOUTH DAILY Patient taking differently: TAKE 40 mh TABLET BY MOUTH DAILY 05/31/16   [name], MD  PARoxetine (PAXIL) 20 MG tablet TAKE 1 TABLET  BY MOUTH EVERY MORNING Patient taking differently: TAKE 20 mg TABLET BY MOUTH EVERY MORNING 10/24/16   [name], MD  torsemide (DEMADEX) 100 MG tablet Take 1 tablet (100 mg total) by mouth daily. 02/16/17   [name], MD  traMADol (ULTRAM) 50 MG tablet TAKE 1 TABLET BY MOUTH FOUR TIMES DAILY Patient taking differently: TAKE 50 mg TABLET BY MOUTH FOUR TIMES DAILY 09/22/16   [name], MD    Family History Family History  Problem Relation Age of Onset  . Heart disease Mother   . Heart attack Mother   . Hypertension Mother   . Kidney disease Mother   . Pneumonia Father   . Heart attack Father   . Hyperlipidemia Father   . Diabetes Sister   . Breast cancer Sister   . Arthritis Brother   . Diabetes Brother   . Heart attack Sister   . Obesity Sister   . Hypertension Sister   . Heart attack Sister   . Cancer Sister        breast  . Hyperlipidemia Sister   . Hypertension Sister   . Hyperlipidemia Sister   . Breast cancer Maternal Grandmother       Social History Social History   Tobacco Use  . Smoking status: Never Smoker  . Smokeless tobacco: Never Used  Substance Use Topics  . Alcohol use: No  . Drug use: No     Allergies   Other; Penicillins; Amoxicillin; Cephalexin; and Codeine phosphate   Review of Systems Review of Systems  Constitutional: Positive for chills and fever.  HENT: Positive for congestion.   Respiratory: Positive for cough. Negative for shortness of breath.   Cardiovascular: Negative for chest pain.  Gastrointestinal: Positive for nausea and vomiting. Negative for abdominal pain.  Genitourinary: Negative for dysuria.  Neurological: Negative for headaches.  All other systems reviewed and are negative.    Physical Exam Updated Vital Signs BP (!) 191/94 (BP Location: Right Arm)   Pulse 66   Temp 99.4 F (37.4 C) (Oral)   Resp 16   Ht 5\' 5"  (1.651 m)   Wt 70.3 kg (155 lb)   SpO2 98%   BMI 25.79 kg/m   Physical Exam  Constitutional: She is oriented to person, place, and time.  Nontoxic-appearing, no acute distress  HENT:  Head: Normocephalic and atraumatic.  Eyes: Pupils are equal, round, and reactive to light.  Tearing and drainage noted from right eye, mildly injected conjunctiva  Neck: Neck supple.  Cardiovascular: Normal rate, regular rhythm and normal heart sounds.  No murmur heard. Pulmonary/Chest: Effort normal. No respiratory distress. She has no wheezes. She has rales.  Rales right lower lobe  Abdominal: Soft. Bowel sounds are normal. There is no tenderness.  Musculoskeletal: She exhibits no edema.  Lymphadenopathy:    She has no cervical adenopathy.  Neurological: She is alert and oriented to person, place, and time.  Skin: Skin is warm and dry.  Psychiatric: She has a normal mood and affect.  Nursing note and vitals reviewed.    ED Treatments / Results  Labs (all labs ordered are listed, but only abnormal results are displayed) Labs Reviewed  COMPREHENSIVE  METABOLIC PANEL - Abnormal; Notable for the following components:      Result Value   Potassium 3.3 (*)    Chloride 100 (*)    Glucose, Bld 119 (*)    BUN 69 (*)    Creatinine, Ser 2.80 (*)    Total  Protein 5.8 (*)    Albumin 3.2 (*)    GFR calc non Af Amer 16 (*)    GFR calc Af Amer 18 (*)    All other components within normal limits  CBC WITH DIFFERENTIAL/PLATELET - Abnormal; Notable for the following components:   RBC 3.56 (*)    Hemoglobin 10.8 (*)    HCT 33.8 (*)    RDW 15.9 (*)    All other components within normal limits  URINALYSIS, ROUTINE W REFLEX MICROSCOPIC - Abnormal; Notable for the following components:   Color, Urine STRAW (*)    Protein, ur 100 (*)    Leukocytes, UA LARGE (*)    Squamous Epithelial / LPF 0-5 (*)    All other components within normal limits  INFLUENZA PANEL BY PCR (TYPE A & B)  I-STAT CG4 LACTIC ACID, ED  I-STAT CG4 LACTIC ACID, ED    EKG  EKG Interpretation None       Radiology Dg Chest 2 View  Result Date: 04/11/2017 CLINICAL DATA:  Flu like symptoms EXAM: CHEST - 2 VIEW COMPARISON:  01/04/2017, 12/07/2016 FINDINGS: Post sternotomy changes. Small left pleural effusion or thickening. Cardiomegaly. No focal consolidation. Degenerative changes of the spine. Electronic recording device over the left chest. Surgical clips in the right upper quadrant. IMPRESSION: 1. Small left pleural effusion. 2. Cardiomegaly 3. No focal pulmonary infiltrate Electronically Signed   By: Donavan Foil M.D.   On: 04/11/2017 23:22    Procedures Procedures (including critical care time)  Medications Ordered in ED Medications  sodium chloride 0.9 % bolus 500 mL (0 mLs Intravenous Stopped 04/12/17 0449)     Initial Impression / Assessment and Plan / ED Course  I have reviewed the triage vital signs and the nursing notes.  Pertinent labs & imaging results that were available during my care of the patient were reviewed by me and considered in my medical  decision making (see chart for details).     Patient presents with flulike symptoms.  Temperature of 100.5 on arrival.  Otherwise notably hypertensive with a blood pressure 180/72.  She is overall nontoxic appearing.  Lactate normal.  No signs of sepsis.  Patient was given fluids.  Chest x-ray shows a small left pleural effusion.  On exam she has some right-sided rails.  Question pneumonia which is not yet showing up.  Influenza screen is also negative; however, given the sensitivity of the test, would offer Tamiflu.  Otherwise lab work is at baseline.  She is able to ambulate and maintain pulse ox 91-92%.  She was not short of breath.  Feel she can be safely discharged home with close PCP follow-up.  She was given strict return precautions.  After history, exam, and medical workup I feel the patient has been appropriately medically screened and is safe for discharge home. Pertinent diagnoses were discussed with the patient. Patient was given return precautions.   Final Clinical Impressions(s) / ED Diagnoses   Final diagnoses:  Influenza-like illness  Essential hypertension    ED Discharge Orders        Ordered    oseltamivir (TAMIFLU) 75 MG capsule  Every 12 hours     04/12/17 0553    levofloxacin (LEVAQUIN) 500 MG tablet  Daily     04/12/17 0553    ondansetron (ZOFRAN ODT) 4 MG disintegrating tablet  Every 8 hours PRN     04/12/17 0553       Merryl Hacker, MD 04/12/17 [phone]

## 2017-04-12 NOTE — ED Notes (Signed)
Pt ambulated on pulse ox. O2 saturation remained 91-92 %. Pt denies SHOB.

## 2017-04-12 NOTE — Discharge Instructions (Signed)
You were seen today for fever and flulike symptoms.  Your flu testing was negative; however, given your symptoms, will treat with Tamiflu.  Your chest x-ray shows some pleural effusion.  This could correlate with an underlying pneumonia.  You will be treated for this as well.  Follow-up with your primary physician if not improving.

## 2017-04-18 DIAGNOSIS — I129 Hypertensive chronic kidney disease with stage 1 through stage 4 chronic kidney disease, or unspecified chronic kidney disease: Secondary | ICD-10-CM | POA: Diagnosis not present

## 2017-04-18 DIAGNOSIS — N184 Chronic kidney disease, stage 4 (severe): Secondary | ICD-10-CM | POA: Diagnosis not present

## 2017-04-18 DIAGNOSIS — N2581 Secondary hyperparathyroidism of renal origin: Secondary | ICD-10-CM | POA: Diagnosis not present

## 2017-04-18 DIAGNOSIS — C9190 Lymphoid leukemia, unspecified not having achieved remission: Secondary | ICD-10-CM | POA: Diagnosis not present

## 2017-04-20 ENCOUNTER — Other Ambulatory Visit: Payer: Self-pay | Admitting: Family Medicine

## 2017-04-20 DIAGNOSIS — E1149 Type 2 diabetes mellitus with other diabetic neurological complication: Secondary | ICD-10-CM

## 2017-04-26 ENCOUNTER — Other Ambulatory Visit: Payer: Self-pay | Admitting: Family Medicine

## 2017-04-26 DIAGNOSIS — E1121 Type 2 diabetes mellitus with diabetic nephropathy: Secondary | ICD-10-CM

## 2017-04-27 ENCOUNTER — Encounter: Payer: Self-pay | Admitting: Family Medicine

## 2017-04-27 ENCOUNTER — Ambulatory Visit: Payer: Medicare Other | Admitting: Family Medicine

## 2017-04-27 ENCOUNTER — Other Ambulatory Visit: Payer: Self-pay

## 2017-04-27 DIAGNOSIS — N184 Chronic kidney disease, stage 4 (severe): Secondary | ICD-10-CM | POA: Diagnosis not present

## 2017-04-27 DIAGNOSIS — I5022 Chronic systolic (congestive) heart failure: Secondary | ICD-10-CM | POA: Diagnosis not present

## 2017-04-27 DIAGNOSIS — R69 Illness, unspecified: Secondary | ICD-10-CM

## 2017-04-27 DIAGNOSIS — J111 Influenza due to unidentified influenza virus with other respiratory manifestations: Secondary | ICD-10-CM | POA: Insufficient documentation

## 2017-04-27 NOTE — Patient Instructions (Signed)
I think things are generally good and we don't need any major changes.  I think the recent ILI was only a minor setback.  Dose your torsemide based on your daily weight.   For weights below 155, no torsemide that day. Between 155 and 158, 1/2 pill 50mg  that day Between 158-164 take your normal 100 mg pill For weights over 164, take 1 & 1/2 pill. For weights over 170 call me or see me.    See me in 2 months if things are going well.  Sooner if problems.

## 2017-04-27 NOTE — Assessment & Plan Note (Signed)
Seems to be resolving uneventfully.  No further intervention.

## 2017-04-27 NOTE — Progress Notes (Signed)
   Subjective:    Patient ID: Christine Santos, female    DOB: [birth year], 72 y.o.   MRN: [id number]  HPI  FU multiple issues. CKD stage 4.  Seems to have stablized.  Has been seen by nephro [name]).  No change. Chronic systolic heart failure.  She is able to do more without SOB.  Seems to have a tight window of fluid control.  Best I can tell, dry wt is ~160.  Has + bilateral leg edema. ER and told pneumonia.  Influenza neg.  CXR was NAD.  Still prescribed levoquin and tamiflu.  Took levaquin, not tamiflu.  Feeling much better. Was also told she was dehydrated in ER     Review of Systems     Objective:   Physical Exam VS noted.  Wt up  14 lbs since eR visit and ~ 7 lbs since last visit with me Lungs clear Cardiac RRR without m or g Abd benign Ext 3+ bilateral edema.        Assessment & Plan:

## 2017-04-27 NOTE — Assessment & Plan Note (Signed)
Seems a little volume overloaded today.  Given fluctuations, we decided on variable dosing of torsemide.  See AVS.

## 2017-04-27 NOTE — Assessment & Plan Note (Signed)
Stable.  Avoid over diuresis.

## 2017-04-28 ENCOUNTER — Telehealth: Payer: Self-pay | Admitting: *Deleted

## 2017-04-28 NOTE — Telephone Encounter (Signed)
Received fax from pharmacy with-- note to prescriber: Need new Rx with correct diagnosis code E11.21 is not accepted. The Rx is for the Accu-Chek Avia Plus test.  Routing to PCP. Katharina Caper, April D, [place]

## 2017-05-01 NOTE — Telephone Encounter (Signed)
Completed fax and sent back.  I am not sure what was unclear.

## 2017-05-02 DIAGNOSIS — E1121 Type 2 diabetes mellitus with diabetic nephropathy: Secondary | ICD-10-CM | POA: Diagnosis not present

## 2017-05-02 NOTE — Telephone Encounter (Signed)
Pt called this morning and said the pharmacy is telling her they still have not gotten the Rx with the corrections on it. Told pt it was refaxed, but she just got off the phone with the pharmacy and they said they have not received it. Please advise

## 2017-05-04 ENCOUNTER — Encounter: Payer: Self-pay | Admitting: Family Medicine

## 2017-05-04 ENCOUNTER — Other Ambulatory Visit: Payer: Self-pay

## 2017-05-04 ENCOUNTER — Ambulatory Visit: Payer: Medicare Other | Admitting: Family Medicine

## 2017-05-04 VITALS — Temp 97.5°F | Wt 164.4 lb

## 2017-05-04 DIAGNOSIS — R197 Diarrhea, unspecified: Secondary | ICD-10-CM

## 2017-05-04 NOTE — Assessment & Plan Note (Addendum)
Need to rule out C. difficile as cause of diarrhea given recent antibiotic use and mucousy quality of diarrhea. Viral gastroenteritis is also a possibility. Patient showed no signs of dehydration on exam and has tolerating both liquids and solid food well. - Stool sample collected; ordered GI panel, including C. difficile toxin assay - Counseled patient on importance of staying well hydrated by continuing to drink lots of water and Gatorade Zero. - CMET ordered to check liver/kidney function and electrolytes given 5-day duration of diarrheal illness and comorbid conditions  05/08/17: GI panel all negative, including C diff Toxins A/B, bacterial infections (Sal, Shig, [place]), viral (noro, rota), parasitic (giardia, cryptosp, cyclosp, amoeba)

## 2017-05-04 NOTE — Patient Instructions (Addendum)
Please stop taking imodium and other anti-diarrheal medications for now. We collected a stool sample today and are going to test it for C. difficile and other causes of diarrhea. We also are checking your kidney and liver function as well as your electrolytes today. to We will call you with the results.  Continue to drink lots of Gatorade Zero and water to stay hydrated!

## 2017-05-04 NOTE — Progress Notes (Signed)
Date of Visit: 05/04/2017   HPI:  Patient presents for a same day appointment to discuss diarrhea since Sunday. Diarrhea started Sunday morning and has persisted for the past 5 days. It improved slightly on Tuesday but worsened again over the past 2 days. She goes to the bathroom several times during the day and 4-5 times throughout the night. The diarrhea is light and mucousy but non-bloody with a strong, foul odor. She has noticed a small amount blood on toilet paper when she wipes and also feels a mild burning sensation when she goes. She is drinking lots of water and Gatorade Zero throughout the day and is also able to eat normally. She denies nausea, vomiting, fevers, chills, cough, rhinorrhea, and changes in mental status. She endorses fatigue and body aches.  Of note, patient was seen in ED on 04/12/2017 and prescribed a 7-day course of levofloxacin, 500 mg daily.    ROS: See HPI  PMH: Systolic heart failure, chronic kidney disease, diabetes mellitus, paroxysmal atrial fibrillation  PHYSICAL EXAM: Temp (!) 97.5 F (36.4 C) (Oral)   Wt 164 lb 6.4 oz (74.6 kg)   SpO2 92%   BMI 27.36 kg/m  Gen: Well appearing, no acute distress HEENT: Moist mucous membranes, no lymphadenopathy; slight conjunctival pallor Heart: RRR, no murmurs Lungs: CTAB, NWOB Abdomen: Soft, non-distended; tenderness to light palpation over upper right quadrant, tenderness to deep palpation over left lower quadrant Neuro: grossly non-focal, normal speech  ASSESSMENT/PLAN:  Diarrhea Need to rule out C. difficile as cause of diarrhea given recent antibiotic use and mucousy quality of diarrhea. Viral gastroenteritis is also a possibility. Patient showed no signs of dehydration on exam and has tolerating both liquids and solid food well. - Stool sample collected; ordered GI panel, including C. difficile toxin assay - Counseled patient on importance of staying well hydrated by continuing to drink lots of water and  Gatorade Zero. - CMET ordered to check liver/kidney function and electrolytes given 5-day duration of diarrheal illness and comorbid conditions

## 2017-05-04 NOTE — Telephone Encounter (Signed)
Verified test strips have been received.

## 2017-05-05 ENCOUNTER — Telehealth: Payer: Self-pay | Admitting: Family Medicine

## 2017-05-05 ENCOUNTER — Encounter: Payer: Self-pay | Admitting: Family Medicine

## 2017-05-05 MED FILL — VANCOMYCIN HCL 125 MG CAP: 125 | 10 days supply | Qty: 40 | Fill #0

## 2017-05-05 NOTE — Telephone Encounter (Signed)
See A/P in ov note from 4/12.

## 2017-05-05 NOTE — Progress Notes (Signed)
Patient ID: Christine Santos, female   DOB: Apr 22, 1945, 72 y.o.   MRN: [id number] I have interviewed and examined the patient Christine Santos.  I have discussed the case and verified the key findings with Christine Santos.   I agree with their assessments and plans as documented in their office visit note.   Christine Santos is accompanied by sister and husband Sources of clinical information for visit is/are patient, spouse/SO, relative(s) and past medical records. Nursing assessment for this office visit was reviewed with the patient for accuracy and revision.  Previous Report(s) Reviewed: historical medical records  Depression screen [facility] 2/9 05/04/2017  Decreased Interest 0  Down, Depressed, Hopeless 0  PHQ - 2 Score 0  Altered sleeping -  Tired, decreased energy -  Change in appetite -  Feeling bad or failure about yourself  -  Trouble concentrating -  Moving slowly or fidgety/restless -  Suicidal thoughts -  PHQ-9 Score -  Difficult doing work/chores -  Some recent data might be hidden   Fall Risk  05/04/2017 04/27/2017 03/09/2017 01/25/2017 01/05/2017  Falls in the past year? Yes No No No No  Number falls in past yr: 1 - - - -  Injury with Fall? Yes - - - -  Comment - - - - -  Risk Factor Category  - - - - -  Comment - - - - -  Risk for fall due to : - - - - -  Risk for fall due to: Comment - - - - -  Follow up - - - - -   Acute diarrheal Illness - Cramping with Bowel movement. No abdominal pain between BMs. Mucus in stool.   - Nonbloody.  - Only mild dehydration, SCr up slightly - Exposure to Levaquin for 7 days for possible pneumonia diagnosis by EDP 04/12/17. - Elevated WBC with increase both lymphocyte and eosinophils.  Absolute Eosinophilia could be from a parasitic process - Results of stool GI panel pending (including C diff Assay A/B) Given weekend coming and patient reports worsening of diarrhea, will empiricaly treat for possible C diff colitis with Vanc 125 mg PO  q6hr x 10 days, if tests negative, will stop therapy. I spoke with patient by phone about this recommendation.

## 2017-05-08 ENCOUNTER — Telehealth: Payer: Self-pay | Admitting: Family Medicine

## 2017-05-08 ENCOUNTER — Other Ambulatory Visit: Payer: Self-pay | Admitting: Family Medicine

## 2017-05-08 LAB — CBC WITH DIFFERENTIAL/PLATELET
BASOS: 0 %
Basophils Absolute: 0 10*3/uL (ref 0.0–0.2)
EOS (ABSOLUTE): 2.4 10*3/uL — ABNORMAL HIGH (ref 0.0–0.4)
EOS: 22 %
HEMOGLOBIN: 10.6 g/dL — AB (ref 11.1–15.9)
Hematocrit: 34.3 % (ref 34.0–46.6)
IMMATURE GRANS (ABS): 0 10*3/uL (ref 0.0–0.1)
IMMATURE GRANULOCYTES: 0 %
LYMPHS: 29 %
Lymphocytes Absolute: 3.2 10*3/uL — ABNORMAL HIGH (ref 0.7–3.1)
MCH: 30.1 pg (ref 26.6–33.0)
MCHC: 30.9 g/dL — AB (ref 31.5–35.7)
MCV: 97 fL (ref 79–97)
MONOCYTES: 5 %
Monocytes Absolute: 0.5 10*3/uL (ref 0.1–0.9)
NEUTROS PCT: 44 %
Neutrophils Absolute: 5 10*3/uL (ref 1.4–7.0)
Platelets: 271 10*3/uL (ref 150–379)
RBC: 3.52 x10E6/uL — AB (ref 3.77–5.28)
RDW: 16.3 % — ABNORMAL HIGH (ref 12.3–15.4)
WBC: 11.2 10*3/uL — AB (ref 3.4–10.8)

## 2017-05-08 LAB — GI PROFILE, STOOL, PCR
Adenovirus F 40/41: NOT DETECTED
Astrovirus: NOT DETECTED
C DIFFICILE TOXIN A/B: NOT DETECTED
CAMPYLOBACTER: NOT DETECTED
Cryptosporidium: NOT DETECTED
Cyclospora cayetanensis: NOT DETECTED
ENTEROAGGREGATIVE E COLI: NOT DETECTED
ENTEROTOXIGENIC E COLI: NOT DETECTED
Entamoeba histolytica: NOT DETECTED
Enteropathogenic E coli: NOT DETECTED
GIARDIA LAMBLIA: NOT DETECTED
NOROVIRUS GI/GII: NOT DETECTED
PLESIOMONAS SHIGELLOIDES: NOT DETECTED
ROTAVIRUS A: NOT DETECTED
SHIGA-TOXIN-PRODUCING E COLI: NOT DETECTED
SHIGELLA/ENTEROINVASIVE E COLI: NOT DETECTED
Salmonella: NOT DETECTED
Sapovirus: NOT DETECTED
Vibrio cholerae: NOT DETECTED
Vibrio: NOT DETECTED
YERSINIA ENTEROCOLITICA: NOT DETECTED

## 2017-05-08 LAB — COMPREHENSIVE METABOLIC PANEL
ALK PHOS: 87 IU/L (ref 39–117)
ALT: 11 IU/L (ref 0–32)
AST: 19 IU/L (ref 0–40)
Albumin/Globulin Ratio: 2.6 — ABNORMAL HIGH (ref 1.2–2.2)
Albumin: 3.9 g/dL (ref 3.5–4.8)
BUN/Creatinine Ratio: 21 (ref 12–28)
BUN: 66 mg/dL — AB (ref 8–27)
Bilirubin Total: 0.3 mg/dL (ref 0.0–1.2)
CALCIUM: 9.2 mg/dL (ref 8.7–10.3)
CO2: 26 mmol/L (ref 20–29)
CREATININE: 3.18 mg/dL — AB (ref 0.57–1.00)
Chloride: 98 mmol/L (ref 96–106)
GFR calc Af Amer: 16 mL/min/{1.73_m2} — ABNORMAL LOW (ref >59)
GFR calc non Af Amer: 14 mL/min/{1.73_m2} — ABNORMAL LOW (ref >59)
Globulin, Total: 1.5 g/dL (ref 1.5–4.5)
Glucose: 111 mg/dL — ABNORMAL HIGH (ref 65–99)
Potassium: 4.5 mmol/L (ref 3.5–5.2)
Sodium: 143 mmol/L (ref 134–144)
Total Protein: 5.4 g/dL — ABNORMAL LOW (ref 6.0–8.5)

## 2017-05-08 NOTE — Addendum Note (Signed)
Addended byWendy Poet, Macklen Wilhoite D on: 05/08/2017 09:04 AM   Modules accepted: Orders

## 2017-05-08 NOTE — Telephone Encounter (Signed)
Ispoke with Christine and Christine Santos by phone today. Her diarrhea stopped on Friday, soon after her first dose of oral Vanc. She is tolerating the Vanc without GI upset. If informed het that the stool GI panel was unrevealing, including for C diff Toxin A/B  Given toxin assays 30% false negative rate for C. Diff infection, we agreed to continue the Vanc 125 mg oral for full 10 day course.   She was advised to contact our office should her symptoms return.    I advised her of the elevation in the absolute eosinophil count on her 4/11 CBC w/diff.  She denies allergy/atopic symptoms.  We agreed to send the diff results to Dr Alvy Bimler (Hem-onc) for her review given patient's history of CLL.  Christine Santos is scheduled to see Dr Alvy Bimler in August.

## 2017-05-08 NOTE — Telephone Encounter (Signed)
Noted and appreciate excellent care.

## 2017-05-25 ENCOUNTER — Other Ambulatory Visit: Payer: Self-pay | Admitting: Obstetrics and Gynecology

## 2017-05-25 DIAGNOSIS — Z1231 Encounter for screening mammogram for malignant neoplasm of breast: Secondary | ICD-10-CM

## 2017-05-26 ENCOUNTER — Telehealth: Payer: Self-pay | Admitting: Family Medicine

## 2017-05-26 NOTE — Telephone Encounter (Signed)
Pt was given antibotic for diarrhea.  It cleared up but now 2 weeks later, it  Has started again.  Please call on antibotic again to [facility] on [address].  Pleae advise  ;

## 2017-05-26 NOTE — Telephone Encounter (Signed)
I reviewed chart and called.  C diff never documented.  Patient did improve with oral vanc.  Was fine until last night when significant diarrhea.  No bleeding.  No pain or fever.  No sick contacts.  No interim other antibiotics.  Given C diff not documented, will try immodium AD over the WE.  She will call back Monday if still diarrhea.

## 2017-05-29 ENCOUNTER — Other Ambulatory Visit: Payer: Self-pay | Admitting: Family Medicine

## 2017-05-29 ENCOUNTER — Other Ambulatory Visit: Payer: Self-pay | Admitting: Interventional Cardiology

## 2017-05-29 DIAGNOSIS — K2100 Gastro-esophageal reflux disease with esophagitis, without bleeding: Secondary | ICD-10-CM

## 2017-05-29 MED FILL — VANCOMYCIN HCL 125 MG CAP: 125 | 14 days supply | Qty: 56 | Fill #0

## 2017-05-29 NOTE — Telephone Encounter (Signed)
Spoke with patient.  Still no blood.  Significant abd discomfort and copious diarrhea.  Will treat again for C diff.  I cannot find drug listed in her med list.  She has the box with the prescription number.  She will call pharmacy and ask for a refill, which I will in turn authorize.

## 2017-05-29 NOTE — Telephone Encounter (Signed)
Imodium only helped for a few times. Diarrhea is just as  Bad as it every was this morning. Whats the next step?

## 2017-06-21 ENCOUNTER — Telehealth: Payer: Self-pay | Admitting: Cardiology

## 2017-06-21 NOTE — Telephone Encounter (Signed)
LMOVM requesting that pt send manual transmission b/c home monitor has not updated in at least 14 days.    

## 2017-06-22 ENCOUNTER — Ambulatory Visit: Payer: Medicare Other

## 2017-06-27 ENCOUNTER — Telehealth: Payer: Self-pay

## 2017-06-27 DIAGNOSIS — A0472 Enterocolitis due to Clostridium difficile, not specified as recurrent: Secondary | ICD-10-CM

## 2017-06-27 DIAGNOSIS — R197 Diarrhea, unspecified: Secondary | ICD-10-CM

## 2017-06-27 NOTE — Telephone Encounter (Signed)
Patient has had presumed, but not documented C diff.  The story is good: began after antibiotics, improved with PO vanc on both occasions - then recurred.  Asked come to [facility] for C diff stool test to give clarity on this issue.  I don't want to keep prescribing PO vanc without objective evidence.

## 2017-06-27 NOTE — Telephone Encounter (Signed)
Patient called and stated her diarrhea has returned. She finished her antibiotic while at [facility] 5/16-6/1 and felt great with more energy.  06/25/17 started back with diarrhea which has progressively worsened. Was up 4 times during last night. States she has urgency with abdominal pain while having BM. States diarrhea is slimy. Denies blood, denies fever.  Call back is [phone]  Danley Danker, RN [facility])

## 2017-06-28 DIAGNOSIS — N8189 Other female genital prolapse: Secondary | ICD-10-CM | POA: Diagnosis not present

## 2017-06-28 DIAGNOSIS — R197 Diarrhea, unspecified: Secondary | ICD-10-CM | POA: Diagnosis not present

## 2017-06-28 NOTE — Addendum Note (Signed)
Addended by: Francene Castle on: 06/28/2017 02:04 PM   Modules accepted: Orders

## 2017-06-29 DIAGNOSIS — N8182 Incompetence or weakening of pubocervical tissue: Secondary | ICD-10-CM | POA: Diagnosis not present

## 2017-06-30 ENCOUNTER — Telehealth: Payer: Self-pay

## 2017-06-30 NOTE — Telephone Encounter (Signed)
Told that c diff neg.  No more oral vanc.  Keep appointment with me next week.  If still with symptoms, will refer to GI.

## 2017-06-30 NOTE — Telephone Encounter (Signed)
Pt would like to talk to Dr. Andria Frames. Pt called to see if test results have come back. Pt stated her bowels are still acting up. Pt phone number is [phone].  Ottis Stain, CMA

## 2017-07-02 DIAGNOSIS — A0472 Enterocolitis due to Clostridium difficile, not specified as recurrent: Secondary | ICD-10-CM | POA: Insufficient documentation

## 2017-07-02 HISTORY — DX: Enterocolitis due to Clostridium difficile, not specified as recurrent: A04.72

## 2017-07-02 LAB — C DIFFICILE TOXINS A+B W/RFLX: C DIFFICILE TOXINS A+B, EIA: NEGATIVE

## 2017-07-02 LAB — C DIFFICILE, CYTOTOXIN B

## 2017-07-02 NOTE — Assessment & Plan Note (Signed)
C diff pos and remains symptomatic.  Will refill vanc.  Patient notified.

## 2017-07-03 ENCOUNTER — Telehealth: Payer: Self-pay | Admitting: *Deleted

## 2017-07-03 MED FILL — VANCOMYCIN HCL 125 MG CAP: 125 | 14 days supply | Qty: 56 | Fill #0

## 2017-07-03 NOTE — Telephone Encounter (Signed)
Pt left message on nurse line yesterday (Sunday) stating that Dr. Andria Frames said he was going to call in more medication for her.  Do not see any notation in chart.  Will forward to MD. Kalysta Kneisley, Salome Spotted, [place]

## 2017-07-03 NOTE — Telephone Encounter (Signed)
I called in a prescription for oral vancomicin to [facility] yesterday (Sunday and they are closed).  I called and verified that the patient picked up her prescription this morning and has taken the first dose.

## 2017-07-05 ENCOUNTER — Encounter: Payer: Self-pay | Admitting: Family Medicine

## 2017-07-05 ENCOUNTER — Ambulatory Visit (INDEPENDENT_AMBULATORY_CARE_PROVIDER_SITE_OTHER): Payer: Medicare Other | Admitting: Family Medicine

## 2017-07-05 ENCOUNTER — Other Ambulatory Visit: Payer: Self-pay

## 2017-07-05 VITALS — BP 132/64 | HR 58 | Temp 98.1°F | Ht 65.0 in | Wt 163.8 lb

## 2017-07-05 DIAGNOSIS — A0472 Enterocolitis due to Clostridium difficile, not specified as recurrent: Secondary | ICD-10-CM

## 2017-07-05 DIAGNOSIS — I5022 Chronic systolic (congestive) heart failure: Secondary | ICD-10-CM | POA: Diagnosis not present

## 2017-07-05 DIAGNOSIS — E1149 Type 2 diabetes mellitus with other diabetic neurological complication: Secondary | ICD-10-CM

## 2017-07-05 DIAGNOSIS — N184 Chronic kidney disease, stage 4 (severe): Secondary | ICD-10-CM

## 2017-07-05 DIAGNOSIS — R5381 Other malaise: Secondary | ICD-10-CM | POA: Diagnosis not present

## 2017-07-05 LAB — POCT GLYCOSYLATED HEMOGLOBIN (HGB A1C): HBA1C, POC (CONTROLLED DIABETIC RANGE): 5.9 % (ref 0.0–7.0)

## 2017-07-05 MED ORDER — ONDANSETRON HCL 4 MG PO TABS: 4.0000 mg | ORAL_TABLET | Freq: Three times a day (TID) | ORAL | 1 refills | Status: DC | PRN

## 2017-07-05 NOTE — Assessment & Plan Note (Signed)
Stable.  Check bMP to insure no over diuresis

## 2017-07-05 NOTE — Assessment & Plan Note (Signed)
Have home healh RN assess for service needs.  Likely PT

## 2017-07-05 NOTE — Assessment & Plan Note (Signed)
Great control with her involuntary wt loss

## 2017-07-05 NOTE — Assessment & Plan Note (Addendum)
Check BMP especially given diarrhea losses.  Creat up.  Called patient.  Hold torsemide.  Recheck BMP one week.

## 2017-07-05 NOTE — Progress Notes (Signed)
   Subjective:    Patient ID: Christine Santos, female    DOB: July 28, 1945, 72 y.o.   MRN: [id number]  HPI  FU multiple issues 1. Recurrent c diff.  Finally documented with most recent stool test.  Has begun rx and is starting to feel better.  However, nausea is a problem.  No bleeding, rectal.  2. DM Due for A1C.  With C diff wt loss, her reading is best ever at 5.9 3. CKD.  Last check 2 months ago.  Needs recheck with diarrhea and wt loss. 4. CHF.  She has DOE which I attribute to deconditioning.  Wt down.  Actually, I am worried about over diuresis. 5. Physical deconditioning.  Weak, wobbly and fall risk with this long bout of recurrent c diff.    Review of Systems     Objective:   Physical Exam Lungs clear Cardiac RRR Abd benign Ext 1+ edema.       Assessment & Plan:

## 2017-07-05 NOTE — Patient Instructions (Signed)
I think we are headed in the right direction. Someone should call you about the home nurse visit. I sent nausea meds to the pharmacy. I will call with lab test results. Diabetes is great. Remember to refill the vancomicin I want you to take two rounds of the vancomicin.

## 2017-07-06 LAB — BASIC METABOLIC PANEL
BUN / CREAT RATIO: 16 (ref 12–28)
BUN: 94 mg/dL (ref 8–27)
CALCIUM: 8.9 mg/dL (ref 8.7–10.3)
CHLORIDE: 99 mmol/L (ref 96–106)
CO2: 23 mmol/L (ref 20–29)
Creatinine, Ser: 5.74 mg/dL — ABNORMAL HIGH (ref 0.57–1.00)
GFR calc non Af Amer: 7 mL/min/{1.73_m2} — ABNORMAL LOW (ref >59)
GFR, EST AFRICAN AMERICAN: 8 mL/min/{1.73_m2} — AB (ref >59)
Glucose: 139 mg/dL — ABNORMAL HIGH (ref 65–99)
POTASSIUM: 4.7 mmol/L (ref 3.5–5.2)
Sodium: 141 mmol/L (ref 134–144)

## 2017-07-06 NOTE — Addendum Note (Signed)
Addended by: [name] on: 07/06/2017 09:06 AM   Modules accepted: Orders

## 2017-07-11 ENCOUNTER — Ambulatory Visit
Admission: RE | Admit: 2017-07-11 | Discharge: 2017-07-11 | Disposition: A | Discharge status: Home / Self Care | Payer: Medicare Other | Source: Ambulatory Visit | Attending: Obstetrics and Gynecology | Admitting: Obstetrics and Gynecology

## 2017-07-11 ENCOUNTER — Other Ambulatory Visit: Payer: Medicare Other

## 2017-07-11 DIAGNOSIS — N184 Chronic kidney disease, stage 4 (severe): Secondary | ICD-10-CM | POA: Diagnosis not present

## 2017-07-11 DIAGNOSIS — Z1231 Encounter for screening mammogram for malignant neoplasm of breast: Secondary | ICD-10-CM | POA: Diagnosis not present

## 2017-07-12 ENCOUNTER — Telehealth: Payer: Self-pay | Admitting: Family Medicine

## 2017-07-12 DIAGNOSIS — I509 Heart failure, unspecified: Secondary | ICD-10-CM | POA: Diagnosis not present

## 2017-07-12 DIAGNOSIS — N189 Chronic kidney disease, unspecified: Secondary | ICD-10-CM | POA: Diagnosis not present

## 2017-07-12 DIAGNOSIS — Z7901 Long term (current) use of anticoagulants: Secondary | ICD-10-CM | POA: Diagnosis not present

## 2017-07-12 DIAGNOSIS — Z794 Long term (current) use of insulin: Secondary | ICD-10-CM | POA: Diagnosis not present

## 2017-07-12 DIAGNOSIS — A0471 Enterocolitis due to Clostridium difficile, recurrent: Secondary | ICD-10-CM | POA: Diagnosis not present

## 2017-07-12 DIAGNOSIS — E1122 Type 2 diabetes mellitus with diabetic chronic kidney disease: Secondary | ICD-10-CM | POA: Diagnosis not present

## 2017-07-12 LAB — BASIC METABOLIC PANEL
BUN/Creatinine Ratio: 19 (ref 12–28)
BUN: 80 mg/dL (ref 8–27)
CHLORIDE: 102 mmol/L (ref 96–106)
CO2: 24 mmol/L (ref 20–29)
CREATININE: 4.13 mg/dL — AB (ref 0.57–1.00)
Calcium: 9 mg/dL (ref 8.7–10.3)
GFR calc Af Amer: 12 mL/min/{1.73_m2} — ABNORMAL LOW (ref >59)
GFR calc non Af Amer: 10 mL/min/{1.73_m2} — ABNORMAL LOW (ref >59)
GLUCOSE: 109 mg/dL — AB (ref 65–99)
Potassium: 5.8 mmol/L — ABNORMAL HIGH (ref 3.5–5.2)
SODIUM: 140 mmol/L (ref 134–144)

## 2017-07-12 NOTE — Telephone Encounter (Signed)
Called and verbal orders given as requested.

## 2017-07-12 NOTE — Telephone Encounter (Signed)
Sharyn Lull a nurse from [facility] care called and would like to know if she could have orders placed to see pt   1 time a week for 1 week 2 times a week for 2 weeks 1 times a week for for 2 weeks 1 time a week, every other week for 4 weeks.  The Pt is also requesting to have a PT Evaluation.   The best number to contact Sharyn Lull is [phone]

## 2017-07-14 MED FILL — VANCOMYCIN HCL 125 MG CAP: 125 | 14 days supply | Qty: 56 | Fill #1

## 2017-07-19 DIAGNOSIS — N2581 Secondary hyperparathyroidism of renal origin: Secondary | ICD-10-CM | POA: Diagnosis not present

## 2017-07-19 DIAGNOSIS — C9190 Lymphoid leukemia, unspecified not having achieved remission: Secondary | ICD-10-CM | POA: Diagnosis not present

## 2017-07-19 DIAGNOSIS — I120 Hypertensive chronic kidney disease with stage 5 chronic kidney disease or end stage renal disease: Secondary | ICD-10-CM | POA: Diagnosis not present

## 2017-07-19 DIAGNOSIS — I639 Cerebral infarction, unspecified: Secondary | ICD-10-CM | POA: Diagnosis not present

## 2017-07-19 DIAGNOSIS — N185 Chronic kidney disease, stage 5: Secondary | ICD-10-CM | POA: Diagnosis not present

## 2017-07-20 ENCOUNTER — Other Ambulatory Visit: Payer: Self-pay | Admitting: Nephrology

## 2017-07-20 DIAGNOSIS — N185 Chronic kidney disease, stage 5: Secondary | ICD-10-CM

## 2017-07-21 ENCOUNTER — Telehealth: Payer: Self-pay

## 2017-07-21 NOTE — Telephone Encounter (Signed)
Verbal order given as requested. 

## 2017-07-21 NOTE — Telephone Encounter (Signed)
Junie Panning, PT with [employer], calling for verbal orders:  Continue HH PT 2x/week x 5 weeks for gait training, fall prevention, aerobic conditioning and strengthening.  Danley Danker, RN [facility] RN)

## 2017-07-24 ENCOUNTER — Other Ambulatory Visit: Payer: Self-pay

## 2017-07-24 DIAGNOSIS — Z01812 Encounter for preprocedural laboratory examination: Secondary | ICD-10-CM

## 2017-07-24 DIAGNOSIS — N184 Chronic kidney disease, stage 4 (severe): Secondary | ICD-10-CM

## 2017-07-26 ENCOUNTER — Ambulatory Visit (INDEPENDENT_AMBULATORY_CARE_PROVIDER_SITE_OTHER): Payer: Medicare Other | Admitting: Family Medicine

## 2017-07-26 ENCOUNTER — Other Ambulatory Visit: Payer: Self-pay

## 2017-07-26 ENCOUNTER — Encounter: Payer: Self-pay | Admitting: Family Medicine

## 2017-07-26 DIAGNOSIS — E1121 Type 2 diabetes mellitus with diabetic nephropathy: Secondary | ICD-10-CM | POA: Diagnosis not present

## 2017-07-26 DIAGNOSIS — A0472 Enterocolitis due to Clostridium difficile, not specified as recurrent: Secondary | ICD-10-CM

## 2017-07-26 DIAGNOSIS — N184 Chronic kidney disease, stage 4 (severe): Secondary | ICD-10-CM | POA: Diagnosis not present

## 2017-07-26 NOTE — Progress Notes (Signed)
   Subjective:    Patient ID: Christine Santos, female    DOB: 08/15/1945, 72 y.o.   MRN: [id number]  HPI  FU CKD and c diff. When last seen by me, C diff finally confirmed and on treatment.  Her creat had bumped, I had called and asked her to stop torsemide, which resulted in Creat improving from 5 to 4.  Seen by dr. [name] in interim.  She had sig fluid retention and he restarted torsemide.  Now feels OK.  C/O some morning nausea. He did recent blood work.  Creat now 5.0  On the good side, diarrhea has resolved on oral vanc.  She will come off the oral vanc in 4 days.  Review of Systems     Objective:   Physical Exam VS including weight noted. Lung clear Cardiac RRR with 2/6 SEM Ext 1+ bilateral edema.        Assessment & Plan:

## 2017-07-26 NOTE — Assessment & Plan Note (Signed)
Now approaching stage 5 CKD.  Followed by renal.

## 2017-07-26 NOTE — Assessment & Plan Note (Signed)
Per dr. [name].  Likely approaching dialysis.  Optimistically, her creat was 3.0 just 3 months ago.  Perhaps some of this is reversible.  Unclear if nausea is uremia or due to c diff.

## 2017-07-26 NOTE — Patient Instructions (Signed)
[place]  [place] of [place] know how long we have.  Enjoy the good times.   See me in one month.  I will do blood work then. Stay on the same medicines. Have the pharmacy call me if you need a refill on the nausea medication. Fingers crossed: I hope the diarrhea does not come back when you stop the antibiotics.

## 2017-07-26 NOTE — Assessment & Plan Note (Signed)
Symptomatically improved.  The real test is when she comes off antibiotics.

## 2017-07-31 ENCOUNTER — Ambulatory Visit
Admission: RE | Admit: 2017-07-31 | Discharge: 2017-07-31 | Disposition: A | Discharge status: Home / Self Care | Payer: Medicare Other | Source: Ambulatory Visit | Attending: Nephrology | Admitting: Nephrology

## 2017-07-31 DIAGNOSIS — N185 Chronic kidney disease, stage 5: Secondary | ICD-10-CM

## 2017-07-31 DIAGNOSIS — C9110 Chronic lymphocytic leukemia of B-cell type not having achieved remission: Secondary | ICD-10-CM | POA: Diagnosis not present

## 2017-08-02 ENCOUNTER — Telehealth: Payer: Self-pay | Admitting: Hematology and Oncology

## 2017-08-02 NOTE — Telephone Encounter (Signed)
Called regarding date change °

## 2017-08-04 ENCOUNTER — Telehealth: Payer: Self-pay

## 2017-08-04 DIAGNOSIS — A0472 Enterocolitis due to Clostridium difficile, not specified as recurrent: Secondary | ICD-10-CM

## 2017-08-04 NOTE — Telephone Encounter (Signed)
Patient left message on nurse line that she took last pill on Monday (for c diff) and diarrhea has returned as of today. Please advise.  Call back is  [phone]   Danley Danker, RN [facility])

## 2017-08-07 ENCOUNTER — Other Ambulatory Visit: Payer: Self-pay | Admitting: Family Medicine

## 2017-08-07 DIAGNOSIS — A0472 Enterocolitis due to Clostridium difficile, not specified as recurrent: Secondary | ICD-10-CM

## 2017-08-07 MED ORDER — VANCOMYCIN HCL 125 MG PO CAPS: ORAL_CAPSULE | ORAL | 0 refills | Status: DC

## 2017-08-07 MED FILL — VANCOMYCIN HCL 125 MG CAP: 125 | 36 days supply | Qty: 83 | Fill #0

## 2017-08-07 NOTE — Telephone Encounter (Signed)
Patient called again. Stated she has been gagging/heaving all weekend, can't eat, nausea meds not helping.

## 2017-08-07 NOTE — Telephone Encounter (Signed)
Called patient and told to stop immodium, might make C diff worse Also stop torsemide while poor PO.  May lead to dehydration.  Given vanc taper and ID referral.

## 2017-08-07 NOTE — Telephone Encounter (Signed)
I would either- Treat with the po vanco taper: 125 mg po 4x daily x 14 days 125 mg po 2x daily x 7 days 125 mg po 1x daily x 7 days 125 mg po every other day x 8 days (4 doses) 125 mg po every 3 days x 2 weeks (5 doses)  Or   treat her with difcid 200mg  bid for 10days     Then, consider getting in with Dr Barnie Alderman for stool transplant eval.

## 2017-08-07 NOTE — Telephone Encounter (Signed)
I will check with ID before responding.  Patient has been dealing on and off with diarrhea for months.  Got antibiotics for pneumonia and diarrhea developed.  Treated empirically with oral vanc x 2 weeks 05/04/17.  Diarhea recurred.  I believe she got a second 2 week course.  I finally tested after yet another recurrence and found her c diff positive on 6/5.  Treated with one month of oral vanc.  She does well each time she is treated, but relapses shortly after the treatment is stopped.  She called today stating the diarrhea is back just after stopping one month of oral vanc.    Suggestions?  I will be happy to refer her to ID clinic if you think best.  Thank you in advance for your help.    Rush Landmark

## 2017-08-08 ENCOUNTER — Ambulatory Visit (INDEPENDENT_AMBULATORY_CARE_PROVIDER_SITE_OTHER)
Admission: RE | Admit: 2017-08-08 | Discharge: 2017-08-08 | Disposition: A | Discharge status: Home / Self Care | Payer: Medicare Other | Source: Ambulatory Visit | Attending: Family | Admitting: Family

## 2017-08-08 ENCOUNTER — Ambulatory Visit (HOSPITAL_COMMUNITY)
Admission: RE | Admit: 2017-08-08 | Discharge: 2017-08-08 | Disposition: A | Discharge status: Home / Self Care | Payer: Medicare Other | Source: Ambulatory Visit | Attending: Family | Admitting: Family

## 2017-08-08 DIAGNOSIS — N184 Chronic kidney disease, stage 4 (severe): Secondary | ICD-10-CM | POA: Diagnosis not present

## 2017-08-08 DIAGNOSIS — Z01812 Encounter for preprocedural laboratory examination: Secondary | ICD-10-CM

## 2017-08-08 DIAGNOSIS — N8189 Other female genital prolapse: Secondary | ICD-10-CM | POA: Diagnosis not present

## 2017-08-10 ENCOUNTER — Other Ambulatory Visit: Payer: Self-pay

## 2017-08-10 ENCOUNTER — Encounter: Payer: Self-pay | Admitting: *Deleted

## 2017-08-10 ENCOUNTER — Ambulatory Visit (INDEPENDENT_AMBULATORY_CARE_PROVIDER_SITE_OTHER): Payer: Medicare Other | Admitting: Vascular Surgery

## 2017-08-10 ENCOUNTER — Other Ambulatory Visit: Payer: Self-pay | Admitting: *Deleted

## 2017-08-10 ENCOUNTER — Encounter: Payer: Self-pay | Admitting: Vascular Surgery

## 2017-08-10 VITALS — BP 149/85 | HR 85 | Temp 97.8°F | Resp 18 | Ht 65.0 in | Wt 167.0 lb

## 2017-08-10 DIAGNOSIS — N812 Incomplete uterovaginal prolapse: Secondary | ICD-10-CM | POA: Diagnosis not present

## 2017-08-10 DIAGNOSIS — N184 Chronic kidney disease, stage 4 (severe): Secondary | ICD-10-CM | POA: Diagnosis not present

## 2017-08-10 NOTE — Telephone Encounter (Signed)
Per message fron Dr Baxter Flattery called the patient to try and set an appointment for office visit and had to leave a message for her to call the office at her earliest convenience. Will try again later.

## 2017-08-10 NOTE — H&P (View-Only) (Signed)
Referring Physician: Dr. [name]  Patient name: Christine Santos MRN: [id number] DOB: 09/04/1945 Sex: female  REASON FOR CONSULT: Hemodialysis access  HPI: Christine Santos is a 72 y.o. female with declining renal function now approaching need for hemodialysis.  He has not had any prior access procedures.  She is right-handed.  She does have a history of peripheral arterial disease with previous superficial femoral artery stents bilaterally in 2014 by Dr. Dagoberto Ligas.  She does have a loop recorder but no history of a pacemaker.  Chronic medical problems include diabetes prior history of stroke congestive heart failure atrial fibrillation all of which are currently stable.  She is on Eliquis for her atrial fibrillation.    Past Medical History:  Diagnosis Date  . Anemia 08/29/2011  . Anxiety   . Arthritis    "legs" (01/22/2013)  . CAD (coronary artery disease)    s/p CABG in 1999  . Cataract   . Chronic kidney disease   . CLL (chronic lymphoblastic leukemia)   . Diastolic heart failure ([place])   . GERD (gastroesophageal reflux disease)   . H/O hiatal hernia   . Heart murmur   . Hyperlipidemia   . Hypertension   . IDDM (insulin dependent diabetes mellitus) ([place])   . PAD (peripheral artery disease) ([place])   . Paroxysmal atrial fibrillation (HCC)    a. identified on ILR as part of StrokeAF study  . Peripheral vascular disease ([place])   . Stroke (cerebrum) [facility])    Past Surgical History:  Procedure Laterality Date  . ANGIOPLASTY / STENTING FEMORAL Right 09/2010   SFA/notes 11/25/2010 (01/22/2013)  . ANGIOPLASTY / STENTING FEMORAL Left 11/2010   SFA/notes 11/25/2010 (01/22/2013)  . ANGIOPLASTY / STENTING ILIAC     [name] 11/25/2010 (01/22/2013)  . CATARACT EXTRACTION W/ INTRAOCULAR LENS  IMPLANT, BILATERAL Bilateral ?2011  . CHOLECYSTECTOMY  1993  . CORONARY ARTERY BYPASS GRAFT  10/1994   "CABG X3"  . EP IMPLANTABLE DEVICE N/A 09/11/2015   Procedure: Loop Recorder Insertion;  Surgeon:  Thompson Grayer, MD;  Location: [place] [facility];  Service: Cardiovascular;  Laterality: N/A;  . HEEL SPUR EXCISION Bilateral 1970's  . LOWER EXTREMITY ANGIOGRAM N/A 01/22/2013   Procedure: LOWER EXTREMITY ANGIOGRAM;  Surgeon: [name], MD;  Location: [facility] CATH LAB;  Service: Cardiovascular;  Laterality: N/A;  . SHOULDER OPEN ROTATOR CUFF REPAIR Bilateral 1990's   "2 times on 1 side; once on the other"  . TRANSLUMINAL ATHERECTOMY FEMORAL ARTERY Right 01/22/2013   & balloon  . TUBAL LIGATION  1980's  . VIDEO ASSISTED THORACOSCOPY (VATS)/EMPYEMA Left 11/29/2016   Procedure: VIDEO ASSISTED THORACOSCOPY (VATS)/EMPYEMA;  Surgeon: [name], MD;  Location: [facility] OR;  Service: Thoracic;  Laterality: Left;  VIDEO ASSISTED THORACOSCOPY (VATS)/EMPYEMA    Family History  Problem Relation Age of Onset  . Heart disease Mother   . Heart attack Mother   . Hypertension Mother   . Kidney disease Mother   . Pneumonia Father   . Heart attack Father   . Hyperlipidemia Father   . Diabetes Sister   . Breast cancer Sister   . Arthritis Brother   . Diabetes Brother   . Heart attack Sister   . Obesity Sister   . Hypertension Sister   . Heart attack Sister   . Cancer Sister        breast  . Hyperlipidemia Sister   . Hypertension Sister   . Hyperlipidemia Sister   . Breast  cancer Maternal Grandmother     SOCIAL HISTORY: Social History   Socioeconomic History  . Marital status: Divorced    Spouse name: Not on file  . Number of children: 2  . Years of education: 23  . Highest education level: Not on file  Occupational History  . Occupation: DISABILITY    Employer: OTHER    Comment: Worked at [employer]  . Financial resource strain: Not on file  . Food insecurity:    Worry: Not on file    Inability: Not on file  . Transportation needs:    Medical: Not on file    Non-medical: Not on file  Tobacco Use  . Smoking status: Never Smoker  . Smokeless tobacco: Never  Used  Substance and Sexual Activity  . Alcohol use: No  . Drug use: No  . Sexual activity: Not Currently  Lifestyle  . Physical activity:    Days per week: Not on file    Minutes per session: Not on file  . Stress: Not on file  Relationships  . Social connections:    Talks on phone: Not on file    Gets together: Not on file    Attends religious service: Not on file    Active member of club or organization: Not on file    Attends meetings of clubs or organizations: Not on file    Relationship status: Not on file  . Intimate partner violence:    Fear of current or ex partner: Not on file    Emotionally abused: Not on file    Physically abused: Not on file    Forced sexual activity: Not on file  Other Topics Concern  . Not on file  Social History Narrative   Health Care POA:    Emergency Contact: sister, [name] (h) [phone]   End of Life Plan:    Who lives with you: female friend   Any pets: none   Diet: Pt has a varied diet of protein, starch, vegetables   Exercise: Pt does not have any regular exercise routine.   Seatbelts: Pt reports wearing seatbelt when in vehicles.    Sun Exposure/Protection: Pt reports using sun screen.   Hobbies: going to lake, biking, blue grass music      Current Social History 08/16/2016        Patient lives with Christine Santos (Significant other) in one level home 08/16/2016   Transportation: Patient has own vehicle 08/16/2016   Important Relationships Christine Santos and sisters, Christine Santos and Christine Santos 08/16/2016    Pets: None 08/16/2016   Education / Work:  12 th grade/ Disabled ([employer]) 08/16/2016   Interests / Fun: Going to [facility], out to eat with friends 08/16/2016   Current Stressors: Bilat foot pain (neuropathy) 08/16/2016   Religious / Personal Beliefs: Raised Methodist, goes to [employer] 08/16/2016   L. Silvano Rusk, RN, BSN  Allergies  Allergen Reactions  . Other     Darvocet  . Penicillins   . Amoxicillin Rash    Has patient had a PCN reaction causing immediate rash, facial/tongue/throat swelling, SOB or lightheadedness with hypotension: No Has patient had a PCN reaction causing severe rash involving mucus membranes or skin necrosis: No Has patient had a PCN reaction that required hospitalization: No Has patient had a PCN reaction occurring within the last 10 years: No If all of the above answers are "NO", then may proceed with Cephalosporin use.   . Cephalexin Rash  . Codeine Phosphate Rash    Current Outpatient Medications  Medication Sig Dispense Refill  . ACCU-CHEK AVIVA PLUS test strip TEST THREE TIMES A DAY 300 each 3  . allopurinol (ZYLOPRIM) 100 MG tablet TAKE 1 TABLET(100 MG) BY MOUTH DAILY 90 tablet 3  . Ascorbic Acid (VITAMIN C) 100 MG tablet Take 100 mg by mouth daily.    . carboxymethylcellulose (REFRESH PLUS) 0.5 % SOLN 1 drop 3 (three) times daily as needed.    Marland Kitchen ELIQUIS 5 MG TABS tablet TAKE 1 TABLET(5 MG) BY MOUTH TWICE DAILY 60 tablet 11  . ferrous sulfate 325 (65 FE) MG tablet Take 1 tablet (325 mg total) by mouth daily with breakfast.  3  . GNP GARLIC EXTRACT PO Take 1 tablet by mouth daily.     Marland Kitchen HUMALOG KWIKPEN 100 UNIT/ML KiwkPen Inject 5 Units as directed 3 (three) times daily.  3  . hydrALAZINE (APRESOLINE) 50 MG tablet Take 1 tablet (50 mg total) by mouth 3 (three) times daily. Please make appt with Dr. Irish Lack for June before anymore refills. 1st attempt 270 tablet 0  . insulin glargine (LANTUS) 100 UNIT/ML injection Inject 0.16 mLs (16 Units total) at bedtime into the skin. 10 mL 11  . Melatonin 10 MG CAPS Take 10 mg by mouth at bedtime as needed (sleep).     . metoprolol tartrate (LOPRESSOR) 25 MG tablet TAKE 1 TABLET BY MOUTH TWICE DAILY 180 tablet 3  . Multiple Vitamins-Minerals (EYE VITAMINS PO) Take by mouth. Taking one daily from her opthamologist (Dr. Herbert Deaner)    .  Multiple Vitamins-Minerals (MULTIVITAMINS THER. W/MINERALS) TABS Take 1 tablet by mouth daily.      Marland Kitchen omega-3 acid ethyl esters (LOVAZA) 1 g capsule Take 2 g by mouth 2 (two) times daily.     . ondansetron (ZOFRAN ODT) 4 MG disintegrating tablet Take 1 tablet (4 mg total) by mouth every 8 (eight) hours as needed for nausea or vomiting. 20 tablet 0  . pantoprazole (PROTONIX) 40 MG tablet TAKE 1 TABLET BY MOUTH DAILY 90 tablet 3  . PARoxetine (PAXIL) 20 MG tablet TAKE 1 TABLET BY MOUTH EVERY MORNING (Patient taking differently: TAKE 20 mg TABLET BY MOUTH EVERY MORNING) 90 tablet 3  . torsemide (DEMADEX) 100 MG tablet Take 1 tablet (100 mg total) by mouth daily. 135 tablet 3  . traMADol (ULTRAM) 50 MG tablet TAKE 1 TABLET BY MOUTH FOUR TIMES DAILY 360 tablet 1  . vancomycin (VANCOCIN HCL) 125 MG capsule One capsule PO 4x daily x 14 days: 2x daily x 7 days: 1x daily x 7 days: every other day x 8 days: every 3 days x 2 weeks 83 capsule 0  . HUMALOG KWIKPEN 100 UNIT/ML KiwkPen INJECT 5 UNITS UNDER THE SKIN THREE TIMES DAILY BEFORE MEALS (Patient not taking: Reported on 08/10/2017) 15 mL 3  . LANTUS SOLOSTAR 100 UNIT/ML Solostar Pen ADMINISTER 35 UNITS UNDER  THE SKIN DAILY (Patient not taking: Reported on 08/10/2017) 15 mL 6  . ondansetron (ZOFRAN) 4 MG tablet TAKE 1 TABLET(4 MG) BY MOUTH EVERY 8 HOURS AS NEEDED FOR NAUSEA OR VOMITING (Patient not taking: Reported on 08/10/2017) 20 tablet 0   No current facility-administered medications for this visit.     ROS:   General:  No weight loss, Fever, chills  HEENT: No recent headaches, no nasal bleeding, no visual changes, no sore throat  Neurologic: No dizziness, blackouts, seizures. No recent symptoms of stroke or mini- stroke. No recent episodes of slurred speech, or temporary blindness.  Cardiac: No recent episodes of chest pain/pressure, no shortness of breath at rest.  + shortness of breath with exertion.  Denies history of atrial fibrillation or  irregular heartbeat  Vascular: No history of rest pain in feet.  No history of claudication.  No history of non-healing ulcer, No history of DVT   Pulmonary: No home oxygen, no productive cough, no hemoptysis,  No asthma or wheezing  Musculoskeletal:  [X]  Arthritis, [ ]  Low back pain,  [ ]  Joint pain  Hematologic:No history of hypercoagulable state.  No history of easy bleeding.  No history of anemia  Gastrointestinal: No hematochezia or melena,  No gastroesophageal reflux, no trouble swallowing  Urinary: [X]  chronic Kidney disease, [ ]  on HD - [ ]  MWF or [ ]  TTHS, [ ]  Burning with urination, [ ]  Frequent urination, [ ]  Difficulty urinating;   Skin: No rashes  Psychological: No history of anxiety,  No history of depression   Physical Examination  Vitals:   08/10/17 1317  BP: (!) 149/85  Pulse: 85  Resp: 18  Temp: 97.8 F (36.6 C)  TempSrc: Oral  SpO2: 95%  Weight: 167 lb (75.8 kg)  Height: 5\' 5"  (1.651 m)    Body mass index is 27.79 kg/m.  General:  Alert and oriented, no acute distress HEENT: Normal Neck: No bruit or JVD Pulmonary: Clear to auscultation bilaterally Cardiac: Regular Rate and Rhythm without murmur Abdomen: Soft, non-tender, non-distended, no mass, no scars Skin: No rash Extremity Pulses:  2+ radial, brachial,  pulses bilaterally Musculoskeletal: No deformity or edema  Neurologic: Upper and lower extremity motor 5/5 and symmetric  DATA:  Patient had an arterial duplex exam of her upper extremities as well as a vein mapping ultrasound.  Were performed on August 08, 2017.  She had normal brachial arteries with 3 mm diameter.  She had very small cephalic veins bilaterally but basilic vein was 3 to 5 mm bilaterally.  ASSESSMENT: Patient with declining renal function now approaching need for hemodialysis.  She has upper extremity vasculature appropriate for basilic vein transposition fistula.   PLAN: Left basilic vein transposition fistula with general  anesthesia August 23, 2017.  Risk benefits possible complications and procedure details including but not limited to bleeding infection ischemic steal fistula thrombosis non-maturation of the fistula were explained to the patient and her family today.  They understand and agreed to proceed.   Ruta Hinds, MD [facility] Office: [phone] Pager: [phone]

## 2017-08-10 NOTE — Progress Notes (Signed)
Referring Physician: Dr. [name]  Patient name: Christine Santos MRN: [id number] DOB: 16-May-1945 Sex: female  REASON FOR CONSULT: Hemodialysis access  HPI: Christine Santos is a 72 y.o. female with declining renal function now approaching need for hemodialysis.  He has not had any prior access procedures.  She is right-handed.  She does have a history of peripheral arterial disease with previous superficial femoral artery stents bilaterally in 2014 by Dr. Dagoberto Ligas.  She does have a loop recorder but no history of a pacemaker.  Chronic medical problems include diabetes prior history of stroke congestive heart failure atrial fibrillation all of which are currently stable.  She is on Eliquis for her atrial fibrillation.    Past Medical History:  Diagnosis Date  . Anemia 08/29/2011  . Anxiety   . Arthritis    "legs" (01/22/2013)  . CAD (coronary artery disease)    s/p CABG in 1999  . Cataract   . Chronic kidney disease   . CLL (chronic lymphoblastic leukemia)   . Diastolic heart failure ([place])   . GERD (gastroesophageal reflux disease)   . H/O hiatal hernia   . Heart murmur   . Hyperlipidemia   . Hypertension   . IDDM (insulin dependent diabetes mellitus) ([place])   . PAD (peripheral artery disease) ([place])   . Paroxysmal atrial fibrillation (HCC)    a. identified on ILR as part of StrokeAF study  . Peripheral vascular disease ([place])   . Stroke (cerebrum) [facility])    Past Surgical History:  Procedure Laterality Date  . ANGIOPLASTY / STENTING FEMORAL Right 09/2010   SFA/notes 11/25/2010 (01/22/2013)  . ANGIOPLASTY / STENTING FEMORAL Left 11/2010   SFA/notes 11/25/2010 (01/22/2013)  . ANGIOPLASTY / STENTING ILIAC     [name] 11/25/2010 (01/22/2013)  . CATARACT EXTRACTION W/ INTRAOCULAR LENS  IMPLANT, BILATERAL Bilateral ?2011  . CHOLECYSTECTOMY  1993  . CORONARY ARTERY BYPASS GRAFT  10/1994   "CABG X3"  . EP IMPLANTABLE DEVICE N/A 09/11/2015   Procedure: Loop Recorder Insertion;  Surgeon:  Thompson Grayer, MD;  Location: [place] CV LAB;  Service: Cardiovascular;  Laterality: N/A;  . HEEL SPUR EXCISION Bilateral 1970's  . LOWER EXTREMITY ANGIOGRAM N/A 01/22/2013   Procedure: LOWER EXTREMITY ANGIOGRAM;  Surgeon: [name], MD;  Location: [facility] CATH LAB;  Service: Cardiovascular;  Laterality: N/A;  . SHOULDER OPEN ROTATOR CUFF REPAIR Bilateral 1990's   "2 times on 1 side; once on the other"  . TRANSLUMINAL ATHERECTOMY FEMORAL ARTERY Right 01/22/2013   & balloon  . TUBAL LIGATION  1980's  . VIDEO ASSISTED THORACOSCOPY (VATS)/EMPYEMA Left 11/29/2016   Procedure: VIDEO ASSISTED THORACOSCOPY (VATS)/EMPYEMA;  Surgeon: [name], MD;  [facility] OR;  Service: Thoracic;  Laterality: Left;  VIDEO ASSISTED THORACOSCOPY (VATS)/EMPYEMA    Family History  Problem Relation Age of Onset  . Heart disease Mother   . Heart attack Mother   . Hypertension Mother   . Kidney disease Mother   . Pneumonia Father   . Heart attack Father   . Hyperlipidemia Father   . Diabetes Sister   . Breast cancer Sister   . Arthritis Brother   . Diabetes Brother   . Heart attack Sister   . Obesity Sister   . Hypertension Sister   . Heart attack Sister   . Cancer Sister        breast  . Hyperlipidemia Sister   . Hypertension Sister   . Hyperlipidemia Sister   . Breast  cancer Maternal Grandmother     SOCIAL HISTORY: Social History   Socioeconomic History  . Marital status: Divorced    Spouse name: Not on file  . Number of children: 2  . Years of education: 41  . Highest education level: Not on file  Occupational History  . Occupation: DISABILITY    Employer: OTHER    Comment: Worked at [employer]  . Financial resource strain: Not on file  . Food insecurity:    Worry: Not on file    Inability: Not on file  . Transportation needs:    Medical: Not on file    Non-medical: Not on file  Tobacco Use  . Smoking status: Never Smoker  . Smokeless tobacco: Never  Used  Substance and Sexual Activity  . Alcohol use: No  . Drug use: No  . Sexual activity: Not Currently  Lifestyle  . Physical activity:    Days per week: Not on file    Minutes per session: Not on file  . Stress: Not on file  Relationships  . Social connections:    Talks on phone: Not on file    Gets together: Not on file    Attends religious service: Not on file    Active member of club or organization: Not on file    Attends meetings of clubs or organizations: Not on file    Relationship status: Not on file  . Intimate partner violence:    Fear of current or ex partner: Not on file    Emotionally abused: Not on file    Physically abused: Not on file    Forced sexual activity: Not on file  Other Topics Concern  . Not on file  Social History Narrative   Health Care POA:    Emergency Contact: sister, [name] (h) [phone]   End of Life Plan:    Who lives with you: female friend   Any pets: none   Diet: Pt has a varied diet of protein, starch, vegetables   Exercise: Pt does not have any regular exercise routine.   Seatbelts: Pt reports wearing seatbelt when in vehicles.    Sun Exposure/Protection: Pt reports using sun screen.   Hobbies: going to lake, biking, blue grass music      Current Social History 08/16/2016        Patient lives with [name] (Significant other) in one level home 08/16/2016   Transportation: Patient has own vehicle 08/16/2016   Important Relationships [name] and sisters, [name] and Vaughan Basta 08/16/2016    Pets: None 08/16/2016   Education / Work:  12 th grade/ Disabled ([employer]) 08/16/2016   Interests / Fun: Going to [facility], out to eat with friends 08/16/2016   Current Stressors: Bilat foot pain (neuropathy) 08/16/2016   Religious / Personal Beliefs: Raised Methodist, goes to [employer] 08/16/2016   L. Silvano Rusk, RN, BSN  Allergies  Allergen Reactions  . Other     Darvocet  . Penicillins   . Amoxicillin Rash    Has patient had a PCN reaction causing immediate rash, facial/tongue/throat swelling, SOB or lightheadedness with hypotension: No Has patient had a PCN reaction causing severe rash involving mucus membranes or skin necrosis: No Has patient had a PCN reaction that required hospitalization: No Has patient had a PCN reaction occurring within the last 10 years: No If all of the above answers are "NO", then may proceed with Cephalosporin use.   . Cephalexin Rash  . Codeine Phosphate Rash    Current Outpatient Medications  Medication Sig Dispense Refill  . ACCU-CHEK AVIVA PLUS test strip TEST THREE TIMES A DAY 300 each 3  . allopurinol (ZYLOPRIM) 100 MG tablet TAKE 1 TABLET(100 MG) BY MOUTH DAILY 90 tablet 3  . Ascorbic Acid (VITAMIN C) 100 MG tablet Take 100 mg by mouth daily.    . carboxymethylcellulose (REFRESH PLUS) 0.5 % SOLN 1 drop 3 (three) times daily as needed.    Marland Kitchen ELIQUIS 5 MG TABS tablet TAKE 1 TABLET(5 MG) BY MOUTH TWICE DAILY 60 tablet 11  . ferrous sulfate 325 (65 FE) MG tablet Take 1 tablet (325 mg total) by mouth daily with breakfast.  3  . GNP GARLIC EXTRACT PO Take 1 tablet by mouth daily.     Marland Kitchen HUMALOG KWIKPEN 100 UNIT/ML KiwkPen Inject 5 Units as directed 3 (three) times daily.  3  . hydrALAZINE (APRESOLINE) 50 MG tablet Take 1 tablet (50 mg total) by mouth 3 (three) times daily. Please make appt with Dr. Irish Lack for June before anymore refills. 1st attempt 270 tablet 0  . insulin glargine (LANTUS) 100 UNIT/ML injection Inject 0.16 mLs (16 Units total) at bedtime into the skin. 10 mL 11  . Melatonin 10 MG CAPS Take 10 mg by mouth at bedtime as needed (sleep).     . metoprolol tartrate (LOPRESSOR) 25 MG tablet TAKE 1 TABLET BY MOUTH TWICE DAILY 180 tablet 3  . Multiple Vitamins-Minerals (EYE VITAMINS PO) Take by mouth. Taking one daily from her opthamologist (Dr. Herbert Deaner)    .  Multiple Vitamins-Minerals (MULTIVITAMINS THER. W/MINERALS) TABS Take 1 tablet by mouth daily.      Marland Kitchen omega-3 acid ethyl esters (LOVAZA) 1 g capsule Take 2 g by mouth 2 (two) times daily.     . ondansetron (ZOFRAN ODT) 4 MG disintegrating tablet Take 1 tablet (4 mg total) by mouth every 8 (eight) hours as needed for nausea or vomiting. 20 tablet 0  . pantoprazole (PROTONIX) 40 MG tablet TAKE 1 TABLET BY MOUTH DAILY 90 tablet 3  . PARoxetine (PAXIL) 20 MG tablet TAKE 1 TABLET BY MOUTH EVERY MORNING (Patient taking differently: TAKE 20 mg TABLET BY MOUTH EVERY MORNING) 90 tablet 3  . torsemide (DEMADEX) 100 MG tablet Take 1 tablet (100 mg total) by mouth daily. 135 tablet 3  . traMADol (ULTRAM) 50 MG tablet TAKE 1 TABLET BY MOUTH FOUR TIMES DAILY 360 tablet 1  . vancomycin (VANCOCIN HCL) 125 MG capsule One capsule PO 4x daily x 14 days: 2x daily x 7 days: 1x daily x 7 days: every other day x 8 days: every 3 days x 2 weeks 83 capsule 0  . HUMALOG KWIKPEN 100 UNIT/ML KiwkPen INJECT 5 UNITS UNDER THE SKIN THREE TIMES DAILY BEFORE MEALS (Patient not taking: Reported on 08/10/2017) 15 mL 3  . LANTUS SOLOSTAR 100 UNIT/ML Solostar Pen ADMINISTER 35 UNITS UNDER  THE SKIN DAILY (Patient not taking: Reported on 08/10/2017) 15 mL 6  . ondansetron (ZOFRAN) 4 MG tablet TAKE 1 TABLET(4 MG) BY MOUTH EVERY 8 HOURS AS NEEDED FOR NAUSEA OR VOMITING (Patient not taking: Reported on 08/10/2017) 20 tablet 0   No current facility-administered medications for this visit.     ROS:   General:  No weight loss, Fever, chills  HEENT: No recent headaches, no nasal bleeding, no visual changes, no sore throat  Neurologic: No dizziness, blackouts, seizures. No recent symptoms of stroke or mini- stroke. No recent episodes of slurred speech, or temporary blindness.  Cardiac: No recent episodes of chest pain/pressure, no shortness of breath at rest.  + shortness of breath with exertion.  Denies history of atrial fibrillation or  irregular heartbeat  Vascular: No history of rest pain in feet.  No history of claudication.  No history of non-healing ulcer, No history of DVT   Pulmonary: No home oxygen, no productive cough, no hemoptysis,  No asthma or wheezing  Musculoskeletal:  [X]  Arthritis, [ ]  Low back pain,  [ ]  Joint pain  Hematologic:No history of hypercoagulable state.  No history of easy bleeding.  No history of anemia  Gastrointestinal: No hematochezia or melena,  No gastroesophageal reflux, no trouble swallowing  Urinary: [X]  chronic Kidney disease, [ ]  on HD - [ ]  MWF or [ ]  TTHS, [ ]  Burning with urination, [ ]  Frequent urination, [ ]  Difficulty urinating;   Skin: No rashes  Psychological: No history of anxiety,  No history of depression   Physical Examination  Vitals:   08/10/17 1317  BP: (!) 149/85  Pulse: 85  Resp: 18  Temp: 97.8 F (36.6 C)  TempSrc: Oral  SpO2: 95%  Weight: 167 lb (75.8 kg)  Height: 5\' 5"  (1.651 m)    Body mass index is 27.79 kg/m.  General:  Alert and oriented, no acute distress HEENT: Normal Neck: No bruit or JVD Pulmonary: Clear to auscultation bilaterally Cardiac: Regular Rate and Rhythm without murmur Abdomen: Soft, non-tender, non-distended, no mass, no scars Skin: No rash Extremity Pulses:  2+ radial, brachial,  pulses bilaterally Musculoskeletal: No deformity or edema  Neurologic: Upper and lower extremity motor 5/5 and symmetric  DATA:  Patient had an arterial duplex exam of her upper extremities as well as a vein mapping ultrasound.  Were performed on August 08, 2017.  She had normal brachial arteries with 3 mm diameter.  She had very small cephalic veins bilaterally but basilic vein was 3 to 5 mm bilaterally.  ASSESSMENT: Patient with declining renal function now approaching need for hemodialysis.  She has upper extremity vasculature appropriate for basilic vein transposition fistula.   PLAN: Left basilic vein transposition fistula with general  anesthesia August 23, 2017.  Risk benefits possible complications and procedure details including but not limited to bleeding infection ischemic steal fistula thrombosis non-maturation of the fistula were explained to the patient and her family today.  They understand and agreed to proceed.   Ruta Hinds, MD [facility] [place] Office: [phone] Pager: [phone]

## 2017-08-10 NOTE — Telephone Encounter (Signed)
Hi bill, I will definitely see mrs. Cherian and move forward for FMT. Keeping her on long taper of vancomycin til we see her.  --------  Hi travis, can you have mrs. Abdo placed on my schedule in order to evaluate for FMT

## 2017-08-11 ENCOUNTER — Telehealth: Payer: Self-pay | Admitting: *Deleted

## 2017-08-11 NOTE — Telephone Encounter (Signed)
Per message from Dr Baxter Flattery contacted the patient to schedule her to be seen Monday 08/14/17 at 215 pm.

## 2017-08-14 ENCOUNTER — Ambulatory Visit (INDEPENDENT_AMBULATORY_CARE_PROVIDER_SITE_OTHER): Payer: Medicare Other | Admitting: Internal Medicine

## 2017-08-14 ENCOUNTER — Encounter: Payer: Self-pay | Admitting: Internal Medicine

## 2017-08-14 VITALS — BP 145/81 | HR 99 | Temp 98.1°F | Ht 65.0 in | Wt 168.0 lb

## 2017-08-14 DIAGNOSIS — A0472 Enterocolitis due to Clostridium difficile, not specified as recurrent: Secondary | ICD-10-CM | POA: Diagnosis not present

## 2017-08-14 DIAGNOSIS — Z8719 Personal history of other diseases of the digestive system: Secondary | ICD-10-CM | POA: Diagnosis not present

## 2017-08-14 NOTE — Progress Notes (Signed)
RFV: referral for FMT  Patient ID: Christine Santos, female   DOB: June 30, 1945, 72 y.o.   MRN: [id number]  HPI Christine Santos is a 72yo F with hx of DM, lacunar infarct in 2017, afib, hx of IBS and had a  2 week hospitalization for pneumonia, s/p VATS with prolonged antibiotics this past winter however In late April and early may - nauseated, abdominal cramping, watery diarrhea. treated with oral vancomycin for 10 days but would do better for roughly 2 wk then have new onset of symptoms. She was re-treated with oral vancomycin for 14 days, cleared for 2 weeks then again would have recurrent of watery stools. Her 3rd round was a 28 days at QID dosing (not a taper at this time), her symptoms have cleared for 2- 2 1/2 weeks before diarrhea recurred. Her cdifficile pcr was positive thus Started back on July 16 th- vancomycin now on a taper.She now has 3 bm.-- formed now. No significant weight loss, some but has been intentional for hte past 6 months. Interestingly, when she was initially tested in April she was negative. And it appears was treated based on her symptoms. Not until her studies in June did it show to be cdifficile positive.  Outpatient Encounter Medications as of 08/14/2017  Medication Sig  . ACCU-CHEK AVIVA PLUS test strip TEST THREE TIMES A DAY  . allopurinol (ZYLOPRIM) 100 MG tablet TAKE 1 TABLET(100 MG) BY MOUTH DAILY  . Ascorbic Acid (VITAMIN C) 100 MG tablet Take 100 mg by mouth daily.  . carboxymethylcellulose (REFRESH PLUS) 0.5 % SOLN 1 drop 3 (three) times daily as needed.  Marland Kitchen ELIQUIS 5 MG TABS tablet TAKE 1 TABLET(5 MG) BY MOUTH TWICE DAILY  . ferrous sulfate 325 (65 FE) MG tablet Take 1 tablet (325 mg total) by mouth daily with breakfast.  . GNP GARLIC EXTRACT PO Take 1 tablet by mouth daily.   Marland Kitchen HUMALOG KWIKPEN 100 UNIT/ML KiwkPen Inject 5 Units as directed 3 (three) times daily.  Marland Kitchen HUMALOG KWIKPEN 100 UNIT/ML KiwkPen INJECT 5 UNITS UNDER THE SKIN THREE TIMES DAILY BEFORE MEALS  .  hydrALAZINE (APRESOLINE) 50 MG tablet Take 1 tablet (50 mg total) by mouth 3 (three) times daily. Please make appt with Dr. Irish Lack for June before anymore refills. 1st attempt  . insulin glargine (LANTUS) 100 UNIT/ML injection Inject 0.16 mLs (16 Units total) at bedtime into the skin.  Marland Kitchen LANTUS SOLOSTAR 100 UNIT/ML Solostar Pen ADMINISTER 35 UNITS UNDER THE SKIN DAILY  . Melatonin 10 MG CAPS Take 10 mg by mouth at bedtime as needed (sleep).   . metoprolol tartrate (LOPRESSOR) 25 MG tablet TAKE 1 TABLET BY MOUTH TWICE DAILY  . Multiple Vitamins-Minerals (EYE VITAMINS PO) Take by mouth. Taking one daily from her opthamologist (Dr. Herbert Deaner)  . Multiple Vitamins-Minerals (MULTIVITAMINS THER. W/MINERALS) TABS Take 1 tablet by mouth daily.    Marland Kitchen omega-3 acid ethyl esters (LOVAZA) 1 g capsule Take 2 g by mouth 2 (two) times daily.   . ondansetron (ZOFRAN ODT) 4 MG disintegrating tablet Take 1 tablet (4 mg total) by mouth every 8 (eight) hours as needed for nausea or vomiting.  . ondansetron (ZOFRAN) 4 MG tablet TAKE 1 TABLET(4 MG) BY MOUTH EVERY 8 HOURS AS NEEDED FOR NAUSEA OR VOMITING  . pantoprazole (PROTONIX) 40 MG tablet TAKE 1 TABLET BY MOUTH DAILY  . PARoxetine (PAXIL) 20 MG tablet TAKE 1 TABLET BY MOUTH EVERY MORNING (Patient taking differently: TAKE 20 mg TABLET BY MOUTH EVERY MORNING)  .  torsemide (DEMADEX) 100 MG tablet Take 1 tablet (100 mg total) by mouth daily.  . traMADol (ULTRAM) 50 MG tablet TAKE 1 TABLET BY MOUTH FOUR TIMES DAILY  . vancomycin (VANCOCIN HCL) 125 MG capsule One capsule PO 4x daily x 14 days: 2x daily x 7 days: 1x daily x 7 days: every other day x 8 days: every 3 days x 2 weeks   No facility-administered encounter medications on file as of 08/14/2017.      Patient Active Problem List   Diagnosis Date Noted  . Enteritis due to Clostridium difficile 07/02/2017  . Left hip pain 02/16/2017  . History of stroke 01/20/2017  . Physical deconditioning 12/30/2016  .  Transaminitis 12/23/2016  . Paroxysmal atrial fibrillation ([place]) 10/22/2015  . Sinusitis, chronic 08/05/2015  . Diabetes mellitus type 2 with neurological manifestations ([place]) 02/26/2014  . Hyperkalemia 06/21/2013  . Diabetic nephropathy ([place]) 05/23/2013  . Diabetic neuropathy ([place]) 11/30/2012  . Macular degeneration 07/04/2012  . Diabetic retinopathy ([place]) 03/20/2012  . Osteopenia 09/22/2011  . Anemia in chronic kidney disease 08/29/2011  . Gout 04/13/2011  . Stress incontinence, female 11/11/2010  . Anxiety 08/18/2010  . Chronic systolic heart failure ([place]) 02/18/2009  . SINUS BRADYCARDIA 10/29/2008  . Peripheral vascular disease ([place]) 06/18/2008  . Chronic kidney disease, stage IV (severe) ([place]) 07/13/2007  . Chronic lymphocytic leukemia ([place]) 10/13/2006  . 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 HYPERCHOLESTEROLEMIA 03/23/2006  . Essential hypertension 03/23/2006  . Coronary atherosclerosis 03/23/2006  . REFLUX ESOPHAGITIS 03/23/2006  . IRRITABLE BOWEL SYNDROME 03/23/2006  . CYSTOCELE/RECTOCELE/PROLAPSE,UNSPEC. 03/23/2006  . Osteoarthritis, multiple sites 03/23/2006     Health Maintenance Due  Topic Date Due  . FOOT EXAM  07/06/2017    Social History   Tobacco Use  . Smoking status: Never Smoker  . Smokeless tobacco: Never Used  Substance Use Topics  . Alcohol use: No  . Drug use: No  family history includes Arthritis in her brother; Breast cancer in her maternal grandmother and sister; Cancer in her sister; Diabetes in her brother and sister; Heart attack in her father, mother, sister, and sister; Heart disease in her mother; Hyperlipidemia in her father, sister, and sister; Hypertension in her mother, sister, and sister; Kidney disease in her mother; Obesity in her sister; Pneumonia in her father.  Review of Systems Review of Systems  Constitutional: Negative for fever, chills, diaphoresis, activity change, appetite change, fatigue  and unexpected weight change.  HENT: Negative for congestion, sore throat, rhinorrhea, sneezing, trouble swallowing and sinus pressure.  Eyes: Negative for photophobia and visual disturbance.  Respiratory: Negative for cough, chest tightness, shortness of breath, wheezing and stridor.  Cardiovascular: Negative for chest pain, palpitations and leg swelling.  Gastrointestinal: Negative for nausea, vomiting, abdominal pain, diarrhea, constipation, blood in stool, abdominal distention and anal bleeding.  Genitourinary: Negative for dysuria, hematuria, flank pain and difficulty urinating.  Musculoskeletal: Negative for myalgias, back pain, joint swelling, arthralgias and gait problem.  Skin: Negative for color change, pallor, rash and wound.  Neurological: Negative for dizziness, tremors, weakness and light-headedness.  Hematological: Negative for adenopathy. Does not bruise/bleed easily.  Psychiatric/Behavioral: Negative for behavioral problems, confusion, sleep disturbance, dysphoric mood, decreased concentration and agitation.    Physical Exam   BP (!) 145/81   Pulse 99   Temp 98.1 F (36.7 C) (Oral)   Physical Exam  Constitutional:  oriented to person, place, and time. appears well-developed and well-nourished. No distress.  HENT: [place]/AT, PERRLA, no  scleral icterus Mouth/Throat: Oropharynx is clear and moist. No oropharyngeal exudate.  Cardiovascular: Normal rate, regular rhythm and normal heart sounds. Exam reveals no gallop and no friction rub.  No murmur heard.  Pulmonary/Chest: Effort normal and breath sounds normal. No respiratory distress.  has no wheezes.  Neck = supple, no nuchal rigidity Abdominal: Soft. Bowel sounds are normal.  exhibits no distension. There is no tenderness.  Lymphadenopathy: no cervical adenopathy. No axillary adenopathy Neurological: alert and oriented to person, place, and time.  Skin: Skin is warm and dry. No rash noted. No erythema.  Psychiatric: a  normal mood and affect.  behavior is normal.   Lab Results  Component Value Date   HEPBSAB NEG 01/14/2014   No results found for: RPR, LABRPR  CBC Lab Results  Component Value Date   WBC 11.2 (H) 05/04/2017   RBC 3.52 (L) 05/04/2017   HGB 10.6 (L) 05/04/2017   HCT 34.3 05/04/2017   PLT 271 05/04/2017   MCV 97 05/04/2017   MCH 30.1 05/04/2017   MCHC 30.9 (L) 05/04/2017   RDW 16.3 (H) 05/04/2017   LYMPHSABS 3.2 (H) 05/04/2017   MONOABS 0.7 04/11/2017   EOSABS 2.4 (H) 05/04/2017    BMET Lab Results  Component Value Date   NA 140 07/11/2017   K 5.8 (H) 07/11/2017   CL 102 07/11/2017   CO2 24 07/11/2017   GLUCOSE 109 (H) 07/11/2017   BUN 80 (HH) 07/11/2017   CREATININE 4.13 (H) 07/11/2017   CALCIUM 9.0 07/11/2017   GFRNONAA 10 (L) 07/11/2017   GFRAA 12 (L) 07/11/2017      Assessment and Plan  Christine Santos is a 72yo F with history of IBS and at least one documented case of cdifficile however may have had more since not every episode had been tested. She is presently on a prolonged vanco taper.  At this point, I would like to see if can isolate cdifficile again if she relapses after having this prolonged taper. I am not convinced that her symptoms are all due to cdifficile since she has a hx of ibs-diarrhea predominant.  If she were to have a relapse after finishing this taper, she would be a good candidate for FMT since the efficacy of oral vancomycin declines with each relapse  She is being evaluated by vascular surgery for likely need of HD access as her renal function is continuing to decline and may be a candidate for HD.  We will give her a specimen cup with instructions to give sample of diarrhea if she starts to have profuse diarrhea as she has had in the past.  It was a pleasure to meet mrs. Christine Santos.   Spent 60 min with greater than 50% discussion on proposed FMT procedure, risks and benefits, and that it is still under IRB process  rtc in 1-2 month

## 2017-08-15 ENCOUNTER — Encounter: Payer: Medicare Other | Admitting: *Deleted

## 2017-08-15 DIAGNOSIS — Z006 Encounter for examination for normal comparison and control in clinical research program: Secondary | ICD-10-CM

## 2017-08-16 ENCOUNTER — Encounter: Payer: Self-pay | Admitting: Family Medicine

## 2017-08-16 ENCOUNTER — Other Ambulatory Visit: Payer: Self-pay

## 2017-08-16 ENCOUNTER — Ambulatory Visit (INDEPENDENT_AMBULATORY_CARE_PROVIDER_SITE_OTHER): Payer: Medicare Other | Admitting: Family Medicine

## 2017-08-16 DIAGNOSIS — E1142 Type 2 diabetes mellitus with diabetic polyneuropathy: Secondary | ICD-10-CM | POA: Diagnosis not present

## 2017-08-16 DIAGNOSIS — I739 Peripheral vascular disease, unspecified: Secondary | ICD-10-CM | POA: Diagnosis not present

## 2017-08-16 DIAGNOSIS — I10 Essential (primary) hypertension: Secondary | ICD-10-CM

## 2017-08-16 DIAGNOSIS — I120 Hypertensive chronic kidney disease with stage 5 chronic kidney disease or end stage renal disease: Secondary | ICD-10-CM | POA: Diagnosis not present

## 2017-08-16 DIAGNOSIS — N185 Chronic kidney disease, stage 5: Secondary | ICD-10-CM

## 2017-08-16 DIAGNOSIS — I129 Hypertensive chronic kidney disease with stage 1 through stage 4 chronic kidney disease, or unspecified chronic kidney disease: Secondary | ICD-10-CM | POA: Diagnosis not present

## 2017-08-16 DIAGNOSIS — N2581 Secondary hyperparathyroidism of renal origin: Secondary | ICD-10-CM | POA: Diagnosis not present

## 2017-08-16 DIAGNOSIS — N184 Chronic kidney disease, stage 4 (severe): Secondary | ICD-10-CM | POA: Diagnosis not present

## 2017-08-16 DIAGNOSIS — I639 Cerebral infarction, unspecified: Secondary | ICD-10-CM | POA: Diagnosis not present

## 2017-08-16 NOTE — Assessment & Plan Note (Signed)
Decreased pulses on diabetic foot exam but good cap refill.  Continue to manage risk factors.

## 2017-08-16 NOTE — Assessment & Plan Note (Signed)
Has progressed to CKD stage 5.  No urgent indication for dialysis.  Progression to dialysis is inevitable.  A bit volume overloaded right now.

## 2017-08-16 NOTE — Progress Notes (Signed)
   Subjective:    Patient ID: Christine Santos, female    DOB: February 27, 1945, 72 y.o.   MRN: [id number]  HPI  REcheck multiple issues: 1. CKD stage 5.  Approaching dialysis.  Seen by renal today [name])  Had blood work drawn.  She is a bit above her dry wt estimated to be ~155 lbs.  On torsemide 100 mg daily.  She is scheduled for venous access surgery in left arm in 1 week for inevitable dialysis. 2. Has some nausea, chronic.  Unclear etiology.  Perhaps related to uremia.  She is at risk for diabetic gastroparesis.  She is coping OK 3. Other medical problems remain stable. 4. Due for diabetic foot exam 5. No claudication.  Walking limited by generalized fatique.   Review of Systems     Objective:   Physical Exam  Lungs clear Cardiac RRR with 2/6 sEM Abd benign Ext 2+ edema.          Assessment & Plan:

## 2017-08-16 NOTE — Assessment & Plan Note (Signed)
Great control, no changes

## 2017-08-16 NOTE — Patient Instructions (Signed)
I believe things are going OK. No changes in your medications.   I would like to check with me 2-3 weeks your visit with Dr. [name]. Good luck with the surgery for your dialysis.

## 2017-08-16 NOTE — Assessment & Plan Note (Signed)
Neuropathy verified by diabetic foot exam.  She wears good shoes and is careful about foot care.  Never goes barefoot.

## 2017-08-17 ENCOUNTER — Ambulatory Visit: Payer: Medicare Other | Admitting: Infectious Disease

## 2017-08-17 DIAGNOSIS — N819 Female genital prolapse, unspecified: Secondary | ICD-10-CM | POA: Diagnosis not present

## 2017-08-17 NOTE — Progress Notes (Addendum)
STROKE~AF Research study month 24 visit completed. Loop interrogated.

## 2017-08-18 ENCOUNTER — Telehealth: Payer: Self-pay | Admitting: *Deleted

## 2017-08-18 NOTE — Telephone Encounter (Signed)
Christine Santos needs PT verbal orders for the following:  Twice weekly for 3 weeks to work on balance.  Please call Christine Santos with verbal orders. Juletta Berhe, Salome Spotted, CMA

## 2017-08-18 NOTE — Telephone Encounter (Signed)
LM giving verbal order as requested.

## 2017-08-22 ENCOUNTER — Encounter (HOSPITAL_COMMUNITY): Payer: Self-pay | Admitting: *Deleted

## 2017-08-22 ENCOUNTER — Other Ambulatory Visit: Payer: Self-pay

## 2017-08-22 NOTE — Progress Notes (Signed)
Christine Santos denies chest pain or shortness of breath.  Patient has Type II diabetes.  Patient reports that CBGs have been in the low 100's recently, "my last A1C was 5.8." I instructed patient to only take 8 units of Lantus tonight.I instructed patient to check CBG after awaking and every 2 hours until arrival  to the hospital.  I Instructed patient if CBG is less than 70 to drink 1/2 cup of a clear juice. Recheck CBG in 15 minutes then call pre- op desk at [phone] for further instructions.  I also instructed patient if CBG is greater than 220 to take 2 units of Humolog.

## 2017-08-23 ENCOUNTER — Ambulatory Visit (HOSPITAL_COMMUNITY)
Admission: RE | Admit: 2017-08-23 | Discharge: 2017-08-23 | Disposition: A | Discharge status: Home / Self Care | Payer: Medicare Other | Source: Ambulatory Visit | Attending: Vascular Surgery | Admitting: Vascular Surgery

## 2017-08-23 ENCOUNTER — Encounter (HOSPITAL_COMMUNITY)
Admission: RE | Disposition: A | Discharge status: Home / Self Care | Payer: Self-pay | Source: Ambulatory Visit | Attending: Vascular Surgery

## 2017-08-23 ENCOUNTER — Encounter (HOSPITAL_COMMUNITY): Payer: Self-pay | Admitting: *Deleted

## 2017-08-23 ENCOUNTER — Ambulatory Visit (HOSPITAL_COMMUNITY): Payer: Medicare Other

## 2017-08-23 DIAGNOSIS — C9110 Chronic lymphocytic leukemia of B-cell type not having achieved remission: Secondary | ICD-10-CM | POA: Diagnosis not present

## 2017-08-23 DIAGNOSIS — Z9841 Cataract extraction status, right eye: Secondary | ICD-10-CM | POA: Insufficient documentation

## 2017-08-23 DIAGNOSIS — Z951 Presence of aortocoronary bypass graft: Secondary | ICD-10-CM | POA: Diagnosis not present

## 2017-08-23 DIAGNOSIS — I2510 Atherosclerotic heart disease of native coronary artery without angina pectoris: Secondary | ICD-10-CM | POA: Diagnosis not present

## 2017-08-23 DIAGNOSIS — Z8249 Family history of ischemic heart disease and other diseases of the circulatory system: Secondary | ICD-10-CM | POA: Diagnosis not present

## 2017-08-23 DIAGNOSIS — E1151 Type 2 diabetes mellitus with diabetic peripheral angiopathy without gangrene: Secondary | ICD-10-CM | POA: Insufficient documentation

## 2017-08-23 DIAGNOSIS — Z79899 Other long term (current) drug therapy: Secondary | ICD-10-CM | POA: Diagnosis not present

## 2017-08-23 DIAGNOSIS — I5030 Unspecified diastolic (congestive) heart failure: Secondary | ICD-10-CM | POA: Diagnosis not present

## 2017-08-23 DIAGNOSIS — N185 Chronic kidney disease, stage 5: Secondary | ICD-10-CM | POA: Diagnosis not present

## 2017-08-23 DIAGNOSIS — Z885 Allergy status to narcotic agent status: Secondary | ICD-10-CM | POA: Diagnosis not present

## 2017-08-23 DIAGNOSIS — I5022 Chronic systolic (congestive) heart failure: Secondary | ICD-10-CM | POA: Diagnosis not present

## 2017-08-23 DIAGNOSIS — D631 Anemia in chronic kidney disease: Secondary | ICD-10-CM | POA: Insufficient documentation

## 2017-08-23 DIAGNOSIS — Z794 Long term (current) use of insulin: Secondary | ICD-10-CM | POA: Diagnosis not present

## 2017-08-23 DIAGNOSIS — Z961 Presence of intraocular lens: Secondary | ICD-10-CM | POA: Insufficient documentation

## 2017-08-23 DIAGNOSIS — E1122 Type 2 diabetes mellitus with diabetic chronic kidney disease: Secondary | ICD-10-CM | POA: Insufficient documentation

## 2017-08-23 DIAGNOSIS — Z9842 Cataract extraction status, left eye: Secondary | ICD-10-CM | POA: Insufficient documentation

## 2017-08-23 DIAGNOSIS — I132 Hypertensive heart and chronic kidney disease with heart failure and with stage 5 chronic kidney disease, or end stage renal disease: Secondary | ICD-10-CM | POA: Insufficient documentation

## 2017-08-23 DIAGNOSIS — I1311 Hypertensive heart and chronic kidney disease without heart failure, with stage 5 chronic kidney disease, or end stage renal disease: Secondary | ICD-10-CM | POA: Diagnosis not present

## 2017-08-23 DIAGNOSIS — Z881 Allergy status to other antibiotic agents status: Secondary | ICD-10-CM | POA: Insufficient documentation

## 2017-08-23 DIAGNOSIS — Z8673 Personal history of transient ischemic attack (TIA), and cerebral infarction without residual deficits: Secondary | ICD-10-CM | POA: Insufficient documentation

## 2017-08-23 DIAGNOSIS — I480 Paroxysmal atrial fibrillation: Secondary | ICD-10-CM | POA: Diagnosis not present

## 2017-08-23 DIAGNOSIS — Z880 Allergy status to penicillin: Secondary | ICD-10-CM | POA: Insufficient documentation

## 2017-08-23 DIAGNOSIS — Z7901 Long term (current) use of anticoagulants: Secondary | ICD-10-CM | POA: Diagnosis not present

## 2017-08-23 DIAGNOSIS — N186 End stage renal disease: Secondary | ICD-10-CM | POA: Insufficient documentation

## 2017-08-23 DIAGNOSIS — Z803 Family history of malignant neoplasm of breast: Secondary | ICD-10-CM | POA: Insufficient documentation

## 2017-08-23 HISTORY — DX: Pneumonia, unspecified organism: J18.9

## 2017-08-23 HISTORY — PX: BASCILIC VEIN TRANSPOSITION: SHX5742

## 2017-08-23 LAB — GLUCOSE, CAPILLARY
GLUCOSE-CAPILLARY: 118 mg/dL — AB (ref 70–99)
Glucose-Capillary: 107 mg/dL — ABNORMAL HIGH (ref 70–99)

## 2017-08-23 LAB — POCT I-STAT 4, (NA,K, GLUC, HGB,HCT)
Glucose, Bld: 107 mg/dL — ABNORMAL HIGH (ref 70–99)
HEMATOCRIT: 34 % — AB (ref 36.0–46.0)
HEMOGLOBIN: 11.6 g/dL — AB (ref 12.0–15.0)
POTASSIUM: 4.1 mmol/L (ref 3.5–5.1)
SODIUM: 138 mmol/L (ref 135–145)

## 2017-08-23 LAB — PROTIME-INR
INR: 0.99
Prothrombin Time: 13 seconds (ref 11.4–15.2)

## 2017-08-23 SURGERY — TRANSPOSITION, VEIN, BASILIC
Anesthesia: Monitor Anesthesia Care | Site: Arm Upper | Laterality: Left

## 2017-08-23 MED ORDER — TRAMADOL HCL 50 MG PO TABS: 50.0000 mg | ORAL_TABLET | Freq: Four times a day (QID) | ORAL | 0 refills | Status: DC | PRN

## 2017-08-23 MED ORDER — SODIUM CHLORIDE 0.9 % IV SOLN
INTRAVENOUS | Status: AC
  Filled 2017-08-23: qty 1.2

## 2017-08-23 MED ORDER — FENTANYL CITRATE (PF) 100 MCG/2ML IJ SOLN
INTRAMUSCULAR | Status: AC
  Administered 2017-08-23: 50 ug via INTRAVENOUS
  Filled 2017-08-23: qty 2

## 2017-08-23 MED ORDER — FENTANYL CITRATE (PF) 250 MCG/5ML IJ SOLN
INTRAMUSCULAR | Status: DC | PRN
  Administered 2017-08-23: 25 ug via INTRAVENOUS
  Administered 2017-08-23: 50 ug via INTRAVENOUS

## 2017-08-23 MED ORDER — CHLORHEXIDINE GLUCONATE 4 % EX LIQD: 60.0000 mL | Freq: Once | CUTANEOUS | Status: DC

## 2017-08-23 MED ORDER — SODIUM CHLORIDE 0.9 % IV SOLN
INTRAVENOUS | Status: DC | PRN
  Administered 2017-08-23: 50 ug/min via INTRAVENOUS

## 2017-08-23 MED ORDER — PHENYLEPHRINE 40 MCG/ML (10ML) SYRINGE FOR IV PUSH (FOR BLOOD PRESSURE SUPPORT)
PREFILLED_SYRINGE | INTRAVENOUS | Status: DC | PRN
  Administered 2017-08-23: 80 ug via INTRAVENOUS

## 2017-08-23 MED ORDER — VANCOMYCIN HCL IN DEXTROSE 1-5 GM/200ML-% IV SOLN
1000.0000 mg | INTRAVENOUS | Status: AC
  Administered 2017-08-23: 1000 mg via INTRAVENOUS
  Filled 2017-08-23: qty 200

## 2017-08-23 MED ORDER — SODIUM CHLORIDE 0.9 % IV SOLN
INTRAVENOUS | Status: DC | PRN
  Administered 2017-08-23: 12:00:00

## 2017-08-23 MED ORDER — 0.9 % SODIUM CHLORIDE (POUR BTL) OPTIME
TOPICAL | Status: DC | PRN
  Administered 2017-08-23: 1000 mL

## 2017-08-23 MED ORDER — PROPOFOL 10 MG/ML IV BOLUS
INTRAVENOUS | Status: AC
  Filled 2017-08-23: qty 20

## 2017-08-23 MED ORDER — PROPOFOL 10 MG/ML IV BOLUS
INTRAVENOUS | Status: DC | PRN
  Administered 2017-08-23: 150 mg via INTRAVENOUS

## 2017-08-23 MED ORDER — ONDANSETRON HCL 4 MG/2ML IJ SOLN: 4.0000 mg | Freq: Once | INTRAMUSCULAR | Status: DC | PRN

## 2017-08-23 MED ORDER — HEPARIN SODIUM (PORCINE) 1000 UNIT/ML IJ SOLN
INTRAMUSCULAR | Status: DC | PRN
  Administered 2017-08-23: 3000 [IU] via INTRAVENOUS

## 2017-08-23 MED ORDER — SODIUM CHLORIDE 0.9 % IV SOLN
INTRAVENOUS | Status: DC
  Administered 2017-08-23 (×2): via INTRAVENOUS

## 2017-08-23 MED ORDER — FENTANYL CITRATE (PF) 100 MCG/2ML IJ SOLN
25.0000 ug | INTRAMUSCULAR | Status: DC | PRN
  Administered 2017-08-23: 50 ug via INTRAVENOUS

## 2017-08-23 MED ORDER — LIDOCAINE HCL (PF) 1 % IJ SOLN
INTRAMUSCULAR | Status: AC
  Filled 2017-08-23: qty 30

## 2017-08-23 MED ORDER — LIDOCAINE 2% (20 MG/ML) 5 ML SYRINGE
INTRAMUSCULAR | Status: DC | PRN
  Administered 2017-08-23: 50 mg via INTRAVENOUS

## 2017-08-23 MED ORDER — LIDOCAINE HCL (PF) 1 % IJ SOLN: INTRAMUSCULAR | Status: DC | PRN

## 2017-08-23 MED ORDER — FENTANYL CITRATE (PF) 250 MCG/5ML IJ SOLN
INTRAMUSCULAR | Status: AC
  Filled 2017-08-23: qty 5

## 2017-08-23 MED ORDER — ONDANSETRON HCL 4 MG/2ML IJ SOLN
INTRAMUSCULAR | Status: DC | PRN
  Administered 2017-08-23: 4 mg via INTRAVENOUS

## 2017-08-23 SURGICAL SUPPLY — 39 items
ARMBAND PINK RESTRICT EXTREMIT (MISCELLANEOUS) ×2 IMPLANT
CANISTER SUCT 3000ML PPV (MISCELLANEOUS) ×2 IMPLANT
CANNULA VESSEL 3MM 2 BLNT TIP (CANNULA) ×2 IMPLANT
CLIP VESOCCLUDE MED 24/CT (CLIP) ×2 IMPLANT
CLIP VESOCCLUDE SM WIDE 24/CT (CLIP) ×2 IMPLANT
COVER PROBE W GEL 5X96 (DRAPES) IMPLANT
DECANTER SPIKE VIAL GLASS SM (MISCELLANEOUS) ×2 IMPLANT
DERMABOND ADHESIVE PROPEN (GAUZE/BANDAGES/DRESSINGS) ×1
DERMABOND ADVANCED (GAUZE/BANDAGES/DRESSINGS) ×1
DERMABOND ADVANCED .7 DNX12 (GAUZE/BANDAGES/DRESSINGS) ×1 IMPLANT
DERMABOND ADVANCED .7 DNX6 (GAUZE/BANDAGES/DRESSINGS) ×1 IMPLANT
DRAIN PENROSE 1/4X12 LTX STRL (WOUND CARE) IMPLANT
ELECT REM PT RETURN 9FT ADLT (ELECTROSURGICAL) ×2
ELECTRODE REM PT RTRN 9FT ADLT (ELECTROSURGICAL) ×1 IMPLANT
GLOVE BIO SURGEON STRL SZ 6.5 (GLOVE) ×2 IMPLANT
GLOVE BIO SURGEON STRL SZ7.5 (GLOVE) ×2 IMPLANT
GLOVE BIOGEL PI IND STRL 6.5 (GLOVE) ×1 IMPLANT
GLOVE BIOGEL PI INDICATOR 6.5 (GLOVE) ×1
GOWN STRL REUS W/ TWL LRG LVL3 (GOWN DISPOSABLE) ×4 IMPLANT
GOWN STRL REUS W/ TWL XL LVL3 (GOWN DISPOSABLE) ×2 IMPLANT
GOWN STRL REUS W/TWL LRG LVL3 (GOWN DISPOSABLE) ×4
GOWN STRL REUS W/TWL XL LVL3 (GOWN DISPOSABLE) ×2
HEMOSTAT SPONGE AVITENE ULTRA (HEMOSTASIS) IMPLANT
KIT BASIN OR (CUSTOM PROCEDURE TRAY) ×2 IMPLANT
KIT TURNOVER KIT B (KITS) ×2 IMPLANT
LOOP VESSEL MINI RED (MISCELLANEOUS) IMPLANT
NS IRRIG 1000ML POUR BTL (IV SOLUTION) ×2 IMPLANT
PACK CV ACCESS (CUSTOM PROCEDURE TRAY) ×2 IMPLANT
PAD ARMBOARD 7.5X6 YLW CONV (MISCELLANEOUS) ×4 IMPLANT
SUT PROLENE 6 0 BV (SUTURE) ×2 IMPLANT
SUT PROLENE 7 0 BV 1 (SUTURE) ×4 IMPLANT
SUT SILK 2 0 SH (SUTURE) IMPLANT
SUT VIC AB 3-0 SH 27 (SUTURE) ×2
SUT VIC AB 3-0 SH 27X BRD (SUTURE) ×2 IMPLANT
SUT VIC AB 4-0 PS2 18 (SUTURE) ×2 IMPLANT
SUT VICRYL 4-0 PS2 18IN ABS (SUTURE) ×2 IMPLANT
TOWEL GREEN STERILE (TOWEL DISPOSABLE) ×2 IMPLANT
UNDERPAD 30X30 (UNDERPADS AND DIAPERS) ×2 IMPLANT
WATER STERILE IRR 1000ML POUR (IV SOLUTION) ×2 IMPLANT

## 2017-08-23 NOTE — Interval H&P Note (Signed)
History and Physical Interval Note:  08/23/2017 8:00 AM  Christine Santos  has presented today for surgery, with the diagnosis of CHRONIC KIDNEY DISEASE FOR HEMODIALYSIS ACCESS  The various methods of treatment have been discussed with the patient and family. After consideration of risks, benefits and other options for treatment, the patient has consented to  Procedure(s): FIRST STAGE BASILIC VEIN TRANSPOSITION LEFT UPPER EXTREMITY (Left) as a surgical intervention .  The patient's history has been reviewed, patient examined, no change in status, stable for surgery.  I have reviewed the patient's chart and labs.  Questions were answered to the patient's satisfaction.     Ruta Hinds

## 2017-08-23 NOTE — Anesthesia Preprocedure Evaluation (Addendum)
Anesthesia Evaluation  Patient identified by MRN, date of birth, ID band Patient awake    Reviewed: Allergy & Precautions, NPO status , Patient's Chart, lab work & pertinent test results  Airway Mallampati: II  TM Distance: >3 FB Neck ROM: Full    Dental  (+) Edentulous Upper   Pulmonary    breath sounds clear to auscultation       Cardiovascular hypertension,  Rhythm:Regular Rate:Normal     Neuro/Psych    GI/Hepatic   Endo/Other  diabetes  Renal/GU      Musculoskeletal   Abdominal   Peds  Hematology   Anesthesia Other Findings   Reproductive/Obstetrics                             Anesthesia Physical Anesthesia Plan  ASA: III  Anesthesia Plan: General   Post-op Pain Management:    Induction: Intravenous  PONV Risk Score and Plan:   Airway Management Planned: LMA  Additional Equipment:   Intra-op Plan:   Post-operative Plan:   Informed Consent: I have reviewed the patients History and Physical, chart, labs and discussed the procedure including the risks, benefits and alternatives for the proposed anesthesia with the patient or authorized representative who has indicated his/her understanding and acceptance.   Dental advisory given  Plan Discussed with: CRNA and Anesthesiologist  Anesthesia Plan Comments:       Anesthesia Quick Evaluation

## 2017-08-23 NOTE — Interval H&P Note (Signed)
History and Physical Interval Note:  08/23/2017 11:08 AM  Christine Santos  has presented today for surgery, with the diagnosis of CHRONIC KIDNEY DISEASE FOR HEMODIALYSIS ACCESS  The various methods of treatment have been discussed with the patient and family. After consideration of risks, benefits and other options for treatment, the patient has consented to  Procedure(s): FIRST STAGE BASILIC VEIN TRANSPOSITION LEFT UPPER EXTREMITY (Left) as a surgical intervention .  The patient's history has been reviewed, patient examined, no change in status, stable for surgery.  I have reviewed the patient's chart and labs.  Questions were answered to the patient's satisfaction.     Ruta Hinds

## 2017-08-23 NOTE — Transfer of Care (Signed)
Immediate Anesthesia Transfer of Care Note  Patient: Christine Santos  Procedure(s) Performed: FIRST STAGE BASILIC VEIN TRANSPOSITION LEFT UPPER EXTREMITY (Left Arm Upper)  Patient Location: PACU  Anesthesia Type:General  Level of Consciousness: awake, alert  and oriented  Airway & Oxygen Therapy: Patient Spontanous Breathing and Patient connected to nasal cannula oxygen  Post-op Assessment: Report given to RN and Post -op Vital signs reviewed and stable  Post vital signs: Reviewed and stable  Last Vitals:  Vitals Value Taken Time  BP 146/79 08/23/2017 12:39 PM  Temp    Pulse 87 08/23/2017 12:40 PM  Resp 40 08/23/2017 12:40 PM  SpO2 98 % 08/23/2017 12:40 PM  Vitals shown include unvalidated device data.  Last Pain:  Vitals:   08/23/17 0813  TempSrc: Oral         Complications: No apparent anesthesia complications

## 2017-08-23 NOTE — Anesthesia Procedure Notes (Signed)
Procedure Name: LMA Insertion Date/Time: 08/23/2017 11:26 AM Performed by: Wilburn Cornelia, CRNA Pre-anesthesia Checklist: Patient identified, Emergency Drugs available, Suction available, Patient being monitored and Timeout performed Patient Re-evaluated:Patient Re-evaluated prior to induction Oxygen Delivery Method: Circle system utilized Preoxygenation: Pre-oxygenation with 100% oxygen Induction Type: IV induction Ventilation: Mask ventilation without difficulty LMA: LMA inserted LMA Size: 4.0 Placement Confirmation: positive ETCO2 and breath sounds checked- equal and bilateral Tube secured with: Tape Dental Injury: Teeth and Oropharynx as per pre-operative assessment

## 2017-08-23 NOTE — Op Note (Signed)
Procedure: Left basilic vein transposition fistula first stage, Ultrasound guidance  Preoperative diagnosis: End-stage renal disease  Postoperative diagnosis: Same  Anesthesia: Gen.  Assistant: Arlee Muslim, PAC  Operative findings: 2.5 mm left basilic vein, 3 mm left brachial artery  Operative details: After obtaining informed consent, the patient was taken to the operating room. The patient was placed in supine position operating table. After induction of general anesthesia, the patient's entire left upper extremity was prepped and draped in the usual sterile fashion. Next ultrasound was used to identify the left basilic vein. A longitudinal incision was made just above the antecubital crease in order to expose the basilic vein. The vein was of good quality approximately 3 mm in diameter.  Care was taken to try to not injure any sensory nerves and all the motor nerves were identified and protected. The vein was dissected free circumferentially and small side branches ligated and divided between silk ties or clips. Next the brachial artery was exposed by creating an additional longitudinal incision above the antecubital crease. The artery was approximately 3 mm in diameter. This was dissected free circumferentially. Vessel loops were placed around it. There was some spasm within the artery after dissecting it free. Next the distal basilic vein was ligated with clips and the vein transected.  The patient was given 3000 units of intravenous heparin. Vessel loops were used to control the artery proximally and distally. A longitudinal arteriotomy was made with an 11 blade.  The vein was cut to length and sewn end of vein to side of artery using a running 7-0 Prolene suture. Just prior to completion of the anastomosis, it was forebled backbled and thoroughly flushed. The anastomosis was secured and the Vesseloops were released.  There was a palpable thrill in the proximal aspect of the fistula. There was  also good Doppler flow throughout the course of the fistula. At this point all subcutaneous tissues were reapproximated using running 3-0 Vicryl suture. All skin incisions were closed with running 4  0 Vicryl subcuticular stitch. Dermabond was applied to all incisions. The patient tolerated the procedure well and there were no complications. Instrument sponge needle counts were correct at the end of the case. The patient was taken to the recovery room in stable condition.  Ruta Hinds, MD [facility] Office: [phone] Pager: [phone]

## 2017-08-23 NOTE — Discharge Instructions (Signed)
° °  [facility] ° °Discharge Instructions ° °AV Fistula or Graft Surgery for Dialysis Access ° °Please refer to the following instructions for your post-procedure care. Your surgeon or physician assistant will discuss any changes with you. ° °Activity ° °You may drive the day following your surgery, if you are comfortable and no longer taking prescription pain medication. Resume full activity as the soreness in your incision resolves. ° °Bathing/Showering ° °You may shower after you go home. Keep your incision dry for 48 hours. Do not soak in a bathtub, hot tub, or swim until the incision heals completely. You may not shower if you have a hemodialysis catheter. ° °Incision Care ° °Clean your incision with mild soap and water after 48 hours. Pat the area dry with a clean towel. You do not need a bandage unless otherwise instructed. Do not apply any ointments or creams to your incision. You may have skin glue on your incision. Do not peel it off. It will come off on its own in about one week. Your arm may swell a bit after surgery. To reduce swelling use pillows to elevate your arm so it is above your heart. Your doctor will tell you if you need to lightly wrap your arm with an ACE bandage. ° °Diet ° °Resume your normal diet. There are not special food restrictions following this procedure. In order to heal from your surgery, it is CRITICAL to get adequate nutrition. Your body requires vitamins, minerals, and protein. Vegetables are the best source of vitamins and minerals. Vegetables also provide the perfect balance of protein. Processed food has little nutritional value, so try to avoid this. ° °Medications ° °Resume taking all of your medications. If your incision is causing pain, you may take over-the counter pain relievers such as acetaminophen (Tylenol). If you were prescribed a stronger pain medication, please be aware these medications can cause nausea and constipation. Prevent  nausea by taking the medication with a snack or meal. Avoid constipation by drinking plenty of fluids and eating foods with high amount of fiber, such as fruits, vegetables, and grains. Do not take Tylenol if you are taking prescription pain medications. ° ° ° ° °Follow up °Your surgeon may want to see you in the office following your access surgery. If so, this will be arranged at the time of your surgery. ° °Please call us immediately for any of the following conditions: ° °Increased pain, redness, drainage (pus) from your incision site °Fever of 101 degrees or higher °Severe or worsening pain at your incision site °Hand pain or numbness. ° °Reduce your risk of vascular disease: ° °Stop smoking. If you would like help, call [facility] at 1-800-QUIT-NOW (1-800-784-8669) or [place] at 336-586-4000 ° °Manage your cholesterol °Maintain a desired weight °Control your diabetes °Keep your blood pressure down ° °Dialysis ° °It will take several weeks to several months for your new dialysis access to be ready for use. Your surgeon will determine when it is OK to use it. Your nephrologist will continue to direct your dialysis. You can continue to use your Permcath until your new access is ready for use. ° °If you have any questions, please call the office at [phone]. ° °

## 2017-08-23 NOTE — Anesthesia Postprocedure Evaluation (Deleted)
Anesthesia Post Note  Patient: SHEPHANIE ROMAS  Procedure(s) Performed: FIRST STAGE BASILIC VEIN TRANSPOSITION LEFT UPPER EXTREMITY (Left Arm Upper)     Patient location during evaluation: PACU Anesthesia Type: MAC Level of consciousness: awake and alert Pain management: pain level controlled Vital Signs Assessment: post-procedure vital signs reviewed and stable Respiratory status: spontaneous breathing, nonlabored ventilation, respiratory function stable and patient connected to nasal cannula oxygen Cardiovascular status: blood pressure returned to baseline and stable Postop Assessment: no apparent nausea or vomiting Anesthetic complications: no    Last Vitals:  Vitals:   08/23/17 1330 08/23/17 1340  BP: 132/66 118/63  Pulse: 77 74  Resp: 17   Temp:  36.4 C  SpO2: 93% 94%    Last Pain:  Vitals:   08/23/17 1315  TempSrc:   PainSc: Asleep                 Kyrah Schiro COKER

## 2017-08-24 ENCOUNTER — Encounter (HOSPITAL_COMMUNITY): Payer: Self-pay | Admitting: Vascular Surgery

## 2017-08-25 ENCOUNTER — Telehealth: Payer: Self-pay | Admitting: Vascular Surgery

## 2017-08-25 NOTE — Addendum Note (Signed)
Addendum  created 08/25/17 3846 by Roberts Gaudy, MD   Delete clinical note, Sign clinical note

## 2017-08-25 NOTE — Telephone Encounter (Signed)
Sched. Appt. LVM 10/12/17 8am dialysis duplex 9am p/o MD

## 2017-08-25 NOTE — Anesthesia Postprocedure Evaluation (Addendum)
Anesthesia Post Note  Patient: Christine Santos  Procedure(s) Performed: FIRST STAGE BASILIC VEIN TRANSPOSITION LEFT UPPER EXTREMITY (Left Arm Upper)     Patient location during evaluation: PACU Anesthesia Type: General Level of consciousness: awake and alert Pain management: pain level controlled Vital Signs Assessment: post-procedure vital signs reviewed and stable Respiratory status: spontaneous breathing, nonlabored ventilation, respiratory function stable and patient connected to nasal cannula oxygen Cardiovascular status: blood pressure returned to baseline and stable Postop Assessment: no apparent nausea or vomiting Anesthetic complications: no    Last Vitals:  Vitals:   08/23/17 1330 08/23/17 1340  BP: 132/66 118/63  Pulse: 77 74  Resp: 17   Temp:  36.4 C  SpO2: 93% 94%    Last Pain:  Vitals:   08/23/17 1315  TempSrc:   PainSc: Asleep     I would like to add an addendum to the post-op note of 7/31. The anesthetic was general not MAC.            Marion Seese COKER

## 2017-08-25 NOTE — Addendum Note (Signed)
Addendum  created 08/25/17 9558 by Roberts Gaudy, MD   Review and Sign - Signed, Sign clinical note

## 2017-08-28 ENCOUNTER — Other Ambulatory Visit: Payer: Self-pay

## 2017-08-28 DIAGNOSIS — N184 Chronic kidney disease, stage 4 (severe): Secondary | ICD-10-CM

## 2017-08-28 DIAGNOSIS — Z48812 Encounter for surgical aftercare following surgery on the circulatory system: Secondary | ICD-10-CM

## 2017-08-29 ENCOUNTER — Other Ambulatory Visit (HOSPITAL_COMMUNITY): Payer: Self-pay | Admitting: *Deleted

## 2017-08-30 ENCOUNTER — Ambulatory Visit (HOSPITAL_COMMUNITY)
Admission: RE | Admit: 2017-08-30 | Discharge: 2017-08-30 | Disposition: A | Discharge status: Home / Self Care | Payer: Medicare Other | Source: Ambulatory Visit | Attending: Nephrology | Admitting: Nephrology

## 2017-08-30 ENCOUNTER — Encounter: Payer: Self-pay | Admitting: Interventional Cardiology

## 2017-08-30 ENCOUNTER — Ambulatory Visit: Payer: Medicare Other | Admitting: Interventional Cardiology

## 2017-08-30 VITALS — BP 120/70 | HR 77 | Ht 65.0 in | Wt 161.1 lb

## 2017-08-30 DIAGNOSIS — I4821 Permanent atrial fibrillation: Secondary | ICD-10-CM

## 2017-08-30 DIAGNOSIS — I25119 Atherosclerotic heart disease of native coronary artery with unspecified angina pectoris: Secondary | ICD-10-CM

## 2017-08-30 DIAGNOSIS — N184 Chronic kidney disease, stage 4 (severe): Secondary | ICD-10-CM

## 2017-08-30 DIAGNOSIS — D631 Anemia in chronic kidney disease: Secondary | ICD-10-CM | POA: Diagnosis present

## 2017-08-30 DIAGNOSIS — I739 Peripheral vascular disease, unspecified: Secondary | ICD-10-CM | POA: Diagnosis not present

## 2017-08-30 DIAGNOSIS — I482 Chronic atrial fibrillation: Secondary | ICD-10-CM

## 2017-08-30 DIAGNOSIS — N189 Chronic kidney disease, unspecified: Secondary | ICD-10-CM | POA: Insufficient documentation

## 2017-08-30 MED ORDER — SODIUM CHLORIDE 0.9 % IV SOLN
510.0000 mg | Freq: Once | INTRAVENOUS | Status: AC
  Administered 2017-08-30: 510 mg via INTRAVENOUS
  Filled 2017-08-30: qty 17

## 2017-08-30 NOTE — Discharge Instructions (Signed)

## 2017-08-30 NOTE — Patient Instructions (Signed)
Medication Instructions:  Your physician recommends that you continue on your current medications as directed. Please refer to the Current Medication list given to you today.   Labwork: Your physician recommends that you have a FASTING lipid profile drawn when you see your nephrologist   Testing/Procedures: None ordered  Follow-Up: Your physician wants you to follow-up in: 1 year with Dr. Irish Lack. You will receive a reminder letter in the mail two months in advance. If you don't receive a letter, please call our office to schedule the follow-up appointment.   Any Other Special Instructions Will Be Listed Below (If Applicable).     If you need a refill on your cardiac medications before your next appointment, please call your pharmacy.

## 2017-08-30 NOTE — Progress Notes (Signed)
Cardiology Office Note   Date:  08/30/2017   ID:  Christine Santos, Christine Santos 1945-08-13, MRN [id number]  PCP:  [name], MD    No chief complaint on file.  CAD  Wt Readings from Last 3 Encounters:  08/30/17 161 lb 1.9 oz (73.1 kg)  08/23/17 165 lb (74.8 kg)  08/16/17 165 lb 3.2 oz (74.9 kg)       History of Present Illness: Christine Santos is a 72 y.o. female  who had bilateral LE revascularization. The right leg was treated with PTA and the left SFA was treated with rotational atherectomy severeal years ago. She ha s a h/o CAD, CABG in 1999, CKD. She has had restenosis in her legs but managed medically due to no claudication.   Was treated for CLL with chemo. Now in remission. Feels well. Tolerated Chemo well.   She had a stroke in 8/17 with visual changes.  Visual changes improved.  She had acute on chronic renal failure at that time. Cr up to 5.5.    She has had a fall with a broken arm in 4/18.    She is starting the process for dialysis access. She had the first surgery.  She had low iron and requires an infusion.    She has had recurrent C.Diff.  Denies : Chest pain. Dizziness. Nitroglycerin use. Orthopnea. Palpitations. Paroxysmal nocturnal dyspnea. Shortness of breath. Syncope.   She has had some right leg edema after stopping diuretics.  SHe has been restarted on this by the nephrologist.  She had a sore on the back of her leg which has improved.     Past Medical History:  Diagnosis Date  . Anemia 08/29/2011  . Anxiety   . Arthritis    "legs" (01/22/2013)  . CAD (coronary artery disease)    s/p CABG in 1999  . Cataract   . Chronic kidney disease    Sees Dr [name]  . CLL (chronic lymphoblastic leukemia)    Kidney  . Diastolic heart failure ([place])   . GERD (gastroesophageal reflux disease)   . H/O hiatal hernia   . Heart murmur   . Hyperlipidemia   . Hypertension   . 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 PAD  (peripheral artery disease) ([place])   . Paroxysmal atrial fibrillation ([facility])    a. identified on ILR as part of StrokeAF study  . Peripheral vascular disease ([place])   . Pneumonia 12/23/2017  . Stroke (cerebrum) ([place] [facility]) 2017   "seeing double for 2 weeks" vision normal now. Still has balance issues    Past Surgical History:  Procedure Laterality Date  . ANGIOPLASTY / STENTING FEMORAL Right 09/2010   SFA/notes 11/25/2010 (01/22/2013)  . ANGIOPLASTY / STENTING FEMORAL Left 11/2010   SFA/notes 11/25/2010 (01/22/2013)  . ANGIOPLASTY / STENTING ILIAC     [name] 11/25/2010 (01/22/2013)  . BASCILIC VEIN TRANSPOSITION Left 08/23/2017   Procedure: FIRST STAGE BASILIC VEIN TRANSPOSITION LEFT UPPER EXTREMITY;  Surgeon: [name], MD;  Location: [facility];  Service: Vascular;  Laterality: Left;  . CATARACT EXTRACTION W/ INTRAOCULAR LENS  IMPLANT, BILATERAL Bilateral ?2011  . CHOLECYSTECTOMY  1993  . CORONARY ARTERY BYPASS GRAFT  10/1994   "CABG X3"  . EP IMPLANTABLE DEVICE N/A 09/11/2015   Procedure: Loop Recorder Insertion;  Surgeon: Thompson Grayer, MD;  Location: [facility];  Service: Cardiovascular;  Laterality: N/A;  . HEEL SPUR EXCISION Bilateral 1970's  . LOWER  EXTREMITY ANGIOGRAM N/A 01/22/2013   Procedure: LOWER EXTREMITY ANGIOGRAM;  Surgeon: [name], MD;  Location[facility] C[facility] LAB;  Service: Cardiovascular;  Laterality: N/A;  . SHOULDER OPEN ROTATOR CUFF REPAIR Bilateral 1990's   "2 times on 1 side; once on the other"  . TRANSLUMINAL ATHERECTOMY FEMORAL ARTERY Right 01/22/2013   & balloon  . TUBAL LIGATION  1980's  . VIDEO ASSISTED THORACOSCOPY (VATS)/EMPYEMA Left 11/29/2016   Procedure: VIDEO ASSISTED THORACOSCOPY (VATS)/EMPYEMA;  Surgeon: [name], MD;  Location: [facility] OR;  Service: Thoracic;  Laterality: Left;  VIDEO ASSISTED THORACOSCOPY (VATS)/EMPYEMA     Current Outpatient Medications  Medication Sig Dispense Refill  . ACCU-CHEK AVIVA PLUS test strip TEST THREE  TIMES A DAY 300 each 3  . allopurinol (ZYLOPRIM) 100 MG tablet TAKE 1 TABLET(100 MG) BY MOUTH DAILY 90 tablet 3  . Ascorbic Acid (VITAMIN C) 100 MG tablet Take 100 mg by mouth daily.    . carboxymethylcellulose (REFRESH PLUS) 0.5 % SOLN Place 1 drop into both eyes 3 (three) times daily as needed (dry eyes).     Marland Kitchen ELIQUIS 5 MG TABS tablet TAKE 1 TABLET(5 MG) BY MOUTH TWICE DAILY 60 tablet 11  . ferrous sulfate 325 (65 FE) MG tablet Take 1 tablet (325 mg total) by mouth daily with breakfast.  3  . GNP GARLIC EXTRACT PO Take 1 tablet by mouth daily.     Marland Kitchen HUMALOG KWIKPEN 100 UNIT/ML KiwkPen Inject 5 Units as directed 3 (three) times daily.  3  . hydrALAZINE (APRESOLINE) 50 MG tablet Take 1 tablet (50 mg total) by mouth 3 (three) times daily. Please make appt with Dr. Irish Lack for June before anymore refills. 1st attempt 270 tablet 0  . insulin glargine (LANTUS) 100 UNIT/ML injection Inject 0.16 mLs (16 Units total) at bedtime into the skin. 10 mL 11  . Melatonin 10 MG CAPS Take 10 mg by mouth at bedtime as needed (sleep).     . metoprolol tartrate (LOPRESSOR) 25 MG tablet TAKE 1 TABLET BY MOUTH TWICE DAILY 180 tablet 3  . Multiple Vitamins-Minerals (EYE VITAMINS PO) Take 1 capsule by mouth daily. Taking one daily from her opthamologist (Dr. Herbert Deaner)     . Multiple Vitamins-Minerals (MULTIVITAMINS THER. W/MINERALS) TABS Take 1 tablet by mouth daily.      Marland Kitchen omega-3 acid ethyl esters (LOVAZA) 1 g capsule Take 2 g by mouth 2 (two) times daily.     . ondansetron (ZOFRAN) 4 MG tablet TAKE 1 TABLET(4 MG) BY MOUTH EVERY 8 HOURS AS NEEDED FOR NAUSEA OR VOMITING 20 tablet 0  . pantoprazole (PROTONIX) 40 MG tablet TAKE 1 TABLET BY MOUTH DAILY 90 tablet 3  . PARoxetine (PAXIL) 20 MG tablet TAKE 1 TABLET BY MOUTH EVERY MORNING (Patient taking differently: TAKE 20 mg TABLET BY MOUTH EVERY MORNING) 90 tablet 3  . torsemide (DEMADEX) 100 MG tablet Take 1 tablet (100 mg total) by mouth daily. 135 tablet 3  . traMADol  (ULTRAM) 50 MG tablet TAKE 1 TABLET BY MOUTH FOUR TIMES DAILY 360 tablet 1  . traMADol (ULTRAM) 50 MG tablet Take 1 tablet (50 mg total) by mouth every 6 (six) hours as needed for up to 12 doses. 12 tablet 0  . vancomycin (VANCOCIN HCL) 125 MG capsule One capsule PO 4x daily x 14 days: 2x daily x 7 days: 1x daily x 7 days: every other day x 8 days: every 3 days x 2 weeks 83 capsule 0   No current  facility-administered medications for this visit.     Allergies:   Other; Penicillins; Amoxicillin; Cephalexin; and Codeine phosphate    Social History:  The patient  reports that she has never smoked. She has never used smokeless tobacco. She reports that she does not drink alcohol or use drugs.   Family History:  The patient's family history includes Arthritis in her brother; Breast cancer in her maternal grandmother and sister; Cancer in her sister; Diabetes in her brother and sister; Heart attack in her father, mother, sister, and sister; Heart disease in her mother; Hyperlipidemia in her father, sister, and sister; Hypertension in her mother, sister, and sister; Kidney disease in her mother; Obesity in her sister; Pneumonia in her father.    ROS:  Please see the history of present illness.   Otherwise, review of systems are positive for easy bruising.   All other systems are reviewed and negative.    PHYSICAL EXAM: VS:  BP 120/70   Pulse 77   Ht 5\' 5"  (1.651 m)   Wt 161 lb 1.9 oz (73.1 kg)   SpO2 98%   BMI 26.81 kg/m  , BMI Body mass index is 26.81 kg/m. GEN: Well nourished, well developed, in no acute distress  HEENT: normal  Neck: no JVD, carotid bruits, or masses Cardiac: irregularly; no murmurs, rubs, or gallops,no edema  Respiratory:  clear to auscultation bilaterally, normal work of breathing GI: soft, nontender, nondistended, + BS MS: no deformity or atrophy  Skin: warm and dry, bruised Neuro:  Strength and sensation are intact Psych: euthymic mood, full affect   EKG:     The ekg ordered in 11/18 demonstrates AFib   Recent Labs: 12/02/2016: Magnesium 2.2 05/04/2017: ALT 11; Platelets 271 07/11/2017: BUN 80; Creatinine, Ser 4.13 08/23/2017: Hemoglobin 11.6; Potassium 4.1; Sodium 138   Lipid Panel    Component Value Date/Time   CHOL 169 09/04/2015 0602   TRIG 188 (H) 09/04/2015 0602   HDL 32 (L) 09/04/2015 0602   CHOLHDL 5.3 09/04/2015 0602   VLDL 38 09/04/2015 0602   LDLCALC 99 09/04/2015 0602     Other studies Reviewed: Additional studies/ records that were reviewed today with results demonstrating: labs reviewed.   ASSESSMENT AND PLAN:  1. CAD: No angina on secondary prevention.  COntinue aggressive secondary prevention.   2. AFib: Rate controlled.  ELiquis for stroke prevention.     3. PAD: Some evidence of venous insufficiency.  No obvious signs of arterial insufficiency.  Activity is limited.  4. CRI: Getting ready for dialysis with creation of fistula. 5. Will request nephrology to check lipids and send [place] results at her next blood draw.     Current medicines are reviewed at length with the patient today.  The patient concerns regarding her medicines were addressed.  The following changes have been made:  No change  Labs/ tests ordered today include:  No orders of the defined types were placed in this encounter.   Recommend 150 minutes/week of aerobic exercise Low fat, low carb, high fiber diet recommended  Disposition:   FU in 1 year   Signed, Larae Grooms, MD  08/30/2017 9:33 AM    [facility] [address], [place], [place]  [address] Phone: [phone]; Fax: [phone]

## 2017-09-07 ENCOUNTER — Ambulatory Visit: Payer: Medicare Other | Admitting: Hematology and Oncology

## 2017-09-07 ENCOUNTER — Other Ambulatory Visit: Payer: Medicare Other

## 2017-09-12 ENCOUNTER — Telehealth: Payer: Self-pay | Admitting: Family Medicine

## 2017-09-12 NOTE — Telephone Encounter (Signed)
Professional Verification form dropped off for SCAT program at front desk for completion.  Verified that patient section of form has been completed.  Last DOS/WCC with PCP was 08/16/2017.  Placed form in team folder to be completed by clinical staff.  Christine Santos

## 2017-09-12 NOTE — Telephone Encounter (Signed)
Clinical info completed on Scat form.  Place form in Dr. Lowella Bandy box for completion.  Katharina Caper, Leoma Folds D, [place]

## 2017-09-18 ENCOUNTER — Other Ambulatory Visit: Payer: Self-pay | Admitting: Hematology and Oncology

## 2017-09-18 ENCOUNTER — Other Ambulatory Visit: Payer: Self-pay | Admitting: Interventional Cardiology

## 2017-09-18 DIAGNOSIS — I480 Paroxysmal atrial fibrillation: Secondary | ICD-10-CM

## 2017-09-18 DIAGNOSIS — C9110 Chronic lymphocytic leukemia of B-cell type not having achieved remission: Secondary | ICD-10-CM

## 2017-09-18 NOTE — Telephone Encounter (Signed)
Sister informed that form was ready for pickup.  Copy placed batch scanning.  [place], Salome Spotted, CMA

## 2017-09-18 NOTE — Telephone Encounter (Signed)
Form completed.

## 2017-09-18 NOTE — Telephone Encounter (Signed)
Pt last saw Dr Irish Lack 08/30/17, last labs 07/11/17 Creat 4.13, age 72, weight 73.1.  Pt is on Eliquis 5mg  BID, efficacy and safety not studied in pt's with Creat >2.5.  Fuller [place], PharmD sending message to Dr Irish Lack to see if he wants to have pt continue on Eliquis or switch to Warfarin.  Will await response.

## 2017-09-19 ENCOUNTER — Other Ambulatory Visit: Payer: Self-pay | Admitting: Interventional Cardiology

## 2017-09-19 ENCOUNTER — Inpatient Hospital Stay (HOSPITAL_BASED_OUTPATIENT_CLINIC_OR_DEPARTMENT_OTHER): Payer: Medicare Other | Admitting: Hematology and Oncology

## 2017-09-19 ENCOUNTER — Telehealth: Payer: Self-pay | Admitting: Pharmacist

## 2017-09-19 ENCOUNTER — Inpatient Hospital Stay: Payer: Medicare Other | Attending: Hematology and Oncology

## 2017-09-19 ENCOUNTER — Telehealth: Payer: Self-pay | Admitting: Hematology and Oncology

## 2017-09-19 DIAGNOSIS — Z79899 Other long term (current) drug therapy: Secondary | ICD-10-CM

## 2017-09-19 DIAGNOSIS — Z794 Long term (current) use of insulin: Secondary | ICD-10-CM | POA: Insufficient documentation

## 2017-09-19 DIAGNOSIS — Z9221 Personal history of antineoplastic chemotherapy: Secondary | ICD-10-CM | POA: Diagnosis not present

## 2017-09-19 DIAGNOSIS — D649 Anemia, unspecified: Secondary | ICD-10-CM | POA: Insufficient documentation

## 2017-09-19 DIAGNOSIS — C9191 Lymphoid leukemia, unspecified, in remission: Secondary | ICD-10-CM | POA: Insufficient documentation

## 2017-09-19 DIAGNOSIS — Z7901 Long term (current) use of anticoagulants: Secondary | ICD-10-CM

## 2017-09-19 DIAGNOSIS — N184 Chronic kidney disease, stage 4 (severe): Secondary | ICD-10-CM

## 2017-09-19 DIAGNOSIS — D631 Anemia in chronic kidney disease: Secondary | ICD-10-CM

## 2017-09-19 DIAGNOSIS — C9110 Chronic lymphocytic leukemia of B-cell type not having achieved remission: Secondary | ICD-10-CM

## 2017-09-19 LAB — CBC WITH DIFFERENTIAL/PLATELET
BASOS PCT: 0 %
Basophils Absolute: 0 10*3/uL (ref 0.0–0.1)
EOS ABS: 0.3 10*3/uL (ref 0.0–0.5)
Eosinophils Relative: 4 %
HCT: 33.2 % — ABNORMAL LOW (ref 34.8–46.6)
HEMOGLOBIN: 10.5 g/dL — AB (ref 11.6–15.9)
Lymphocytes Relative: 33 %
Lymphs Abs: 2.9 10*3/uL (ref 0.9–3.3)
MCH: 31.1 pg (ref 25.1–34.0)
MCHC: 31.6 g/dL (ref 31.5–36.0)
MCV: 98.2 fL (ref 79.5–101.0)
MONOS PCT: 8 %
Monocytes Absolute: 0.7 10*3/uL (ref 0.1–0.9)
NEUTROS PCT: 55 %
Neutro Abs: 4.8 10*3/uL (ref 1.5–6.5)
Platelets: 169 10*3/uL (ref 145–400)
RBC: 3.38 MIL/uL — ABNORMAL LOW (ref 3.70–5.45)
RDW: 17.2 % — ABNORMAL HIGH (ref 11.2–14.5)
WBC: 8.8 10*3/uL (ref 3.9–10.3)

## 2017-09-19 MED ORDER — WARFARIN SODIUM 5 MG PO TABS: 5.0000 mg | ORAL_TABLET | Freq: Every day | ORAL | 0 refills | Status: DC

## 2017-09-19 NOTE — Telephone Encounter (Signed)
Gave patient avs and calendar.   °

## 2017-09-19 NOTE — Telephone Encounter (Addendum)
Called pt to discuss transition from Eliquis to warfarin. Pt available to come in for new Coumadin appt on 9/9. First dose of warfarin 5mg  daily will be 9/4. Pt will overlap with Eliquis for 3 days, last dose of Eliquis will be 9/6 in the evening.

## 2017-09-19 NOTE — Telephone Encounter (Signed)
-----   Message from [name], MD sent at 09/19/2017  8:51 AM EDT ----- Regarding: RE: DOAC vs warfarin Sounds like we should switch her to COumadin due to her worsening renal function.  INRs could be checked at dialysis once she starts.  I think she just had her fistula so that will be a few months at least.   JV ----- Message ----- From: Leeroy Bock, [facility] Sent: 09/18/2017  11:57 AM EDT To: [name], MD Subject: DOAC vs warfarin                               Dr Irish Lack,  We received a refill request for Eliquis for Christine Santos. Her SCr has worsened > 4 and pt is preparing for dialysis. She is currently taking Eliquis 5mg  BID. DOAC data is limited in dialysis patients to single-dose PK studies and the ARISTOTLE trial for afib did not include patients with SCr > 2.5. Thoughts on switching pt to warfarin or would you prefer to continue her on Eliquis?  Thanks, [employer]

## 2017-09-19 NOTE — Telephone Encounter (Signed)
See separate phone note 8/27 for details - switching to warfarin due to CKD.

## 2017-09-20 ENCOUNTER — Encounter: Payer: Self-pay | Admitting: Hematology and Oncology

## 2017-09-20 NOTE — Progress Notes (Signed)
Christine Santos OFFICE PROGRESS NOTE  Patient Care Team: [name], MD as PCP - Lillia Pauls, MD (Ophthalmology) [name], MD as Consulting Physician (Cardiology) Zadie Rhine Clent Demark, MD (Ophthalmology) Heath Lark, MD as Consulting Physician (Hematology and Oncology) Estanislado Emms, MD as Consulting Physician (Nephrology) Laurence Spates, MD as Consulting Physician (Gastroenterology)  ASSESSMENT & PLAN:  Chronic lymphocytic leukemia She has no signs of disease recurrence I will see her back in 12 months with history, physical examination and blood work.     Anemia in chronic kidney disease This is likely anemia of chronic disease. The patient denies recent history of bleeding such as epistaxis, hematuria or hematochezia. She is asymptomatic from the anemia. We will observe for now.    No orders of the defined types were placed in this encounter.   INTERVAL HISTORY: Please see below for problem oriented charting. She returns with family members for further follow-up She had recent surgery with fistula creation in anticipation for possible hemodialysis She denies new lymphadenopathy No recent infection.   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    01/15/2014 - 06/04/2014 Chemotherapy    She received Obinutuzumab along with chlorambucil    04/07/2014 Imaging    PET CT scan is negative for disease     REVIEW OF SYSTEMS:   Constitutional: Denies fevers, chills or  abnormal weight loss Eyes: Denies blurriness of vision Ears, nose, mouth, throat, and face: Denies mucositis or sore throat Respiratory: Denies cough, dyspnea or wheezes Cardiovascular: Denies palpitation, chest discomfort or lower extremity swelling Gastrointestinal:  Denies nausea, heartburn or change in bowel habits Skin: Denies abnormal skin rashes Lymphatics: Denies new lymphadenopathy or easy bruising Neurological:Denies numbness, tingling or new weaknesses Behavioral/Psych: Mood is stable, no new changes  All other systems were reviewed with the patient and are negative.  I have reviewed the past medical history, past surgical history, social history and family history with the patient and they are unchanged from previous note.  ALLERGIES:  is allergic to other; penicillins; amoxicillin; cephalexin; and codeine phosphate.  MEDICATIONS:  Current Outpatient Medications  Medication Sig Dispense Refill  . ACCU-CHEK AVIVA PLUS test strip TEST THREE TIMES A DAY 300 each 3  . allopurinol (ZYLOPRIM) 100 MG tablet TAKE 1 TABLET(100 MG) BY MOUTH DAILY 90 tablet 3  . Ascorbic Acid (VITAMIN C) 100 MG tablet Take 100 mg by mouth daily.    . carboxymethylcellulose (REFRESH PLUS) 0.5 % SOLN Place 1 drop into both eyes 3 (three) times daily as needed (dry eyes).     . ferrous sulfate 325 (65 FE) MG tablet Take 1 tablet (325 mg total) by mouth daily with breakfast.  3  . GNP GARLIC EXTRACT PO Take 1 tablet by mouth daily.     Marland Kitchen HUMALOG KWIKPEN 100 UNIT/ML KiwkPen Inject 5 Units as directed 3 (three) times daily.  3  . hydrALAZINE (APRESOLINE) 50 MG tablet Take 1 tablet (50 mg total) by mouth 3 (three) times daily. Please  make appt with Dr. Irish Lack for June before anymore refills. 1st attempt 270 tablet 0  . insulin glargine (LANTUS) 100 UNIT/ML injection Inject 0.16 mLs (16 Units total) at bedtime into the skin. 10 mL 11  . Melatonin 10 MG CAPS Take 10 mg by mouth at bedtime as needed (sleep).      . metoprolol tartrate (LOPRESSOR) 25 MG tablet TAKE 1 TABLET BY MOUTH TWICE DAILY 180 tablet 3  . Multiple Vitamins-Minerals (EYE VITAMINS PO) Take 1 capsule by mouth daily. Taking one daily from her opthamologist (Dr. Herbert Deaner)     . Multiple Vitamins-Minerals (MULTIVITAMINS THER. W/MINERALS) TABS Take 1 tablet by mouth daily.      Marland Kitchen omega-3 acid ethyl esters (LOVAZA) 1 g capsule Take 2 g by mouth 2 (two) times daily.     . ondansetron (ZOFRAN) 4 MG tablet TAKE 1 TABLET(4 MG) BY MOUTH EVERY 8 HOURS AS NEEDED FOR NAUSEA OR VOMITING 20 tablet 0  . pantoprazole (PROTONIX) 40 MG tablet TAKE 1 TABLET BY MOUTH DAILY 90 tablet 3  . PARoxetine (PAXIL) 20 MG tablet TAKE 1 TABLET BY MOUTH EVERY MORNING (Patient taking differently: TAKE 20 mg TABLET BY MOUTH EVERY MORNING) 90 tablet 3  . torsemide (DEMADEX) 100 MG tablet Take 1 tablet (100 mg total) by mouth daily. 135 tablet 3  . traMADol (ULTRAM) 50 MG tablet TAKE 1 TABLET BY MOUTH FOUR TIMES DAILY 360 tablet 1  . traMADol (ULTRAM) 50 MG tablet Take 1 tablet (50 mg total) by mouth every 6 (six) hours as needed for up to 12 doses. 12 tablet 0  . warfarin (COUMADIN) 5 MG tablet Take 1 tablet (5 mg total) by mouth daily. 30 tablet 0   No current facility-administered medications for this visit.     PHYSICAL EXAMINATION: ECOG PERFORMANCE STATUS: 0 - Asymptomatic  Vitals:   09/19/17 1234  BP: (!) 144/60  Pulse: 87  Resp: 18  Temp: 97.7 F (36.5 C)  SpO2: 97%   Filed Weights   09/19/17 1234  Weight: 159 lb 12.8 oz (72.5 kg)    GENERAL:alert, no distress and comfortable SKIN: skin color, texture, turgor are normal, no rashes or significant lesions EYES: normal, Conjunctiva are pink and non-injected, sclera clear OROPHARYNX:no exudate, no erythema and lips, buccal mucosa, and tongue normal  NECK: supple, thyroid normal size, non-tender, without nodularity LYMPH:  no palpable lymphadenopathy in the cervical, axillary or inguinal LUNGS: clear to  auscultation and percussion with normal breathing effort HEART: regular rate & rhythm and no murmurs and no lower extremity edema ABDOMEN:abdomen soft, non-tender and normal bowel sounds Musculoskeletal:no cyanosis of digits and no clubbing  NEURO: alert & oriented x 3 with fluent speech, no focal motor/sensory deficits  LABORATORY DATA:  I have reviewed the data as listed    Component Value Date/Time   NA 138 08/23/2017 0825   NA 140 07/11/2017 1144   NA 143 09/06/2016 1240   K 4.1 08/23/2017 0825   K 4.0 09/06/2016 1240   CL 102 07/11/2017 1144   CL 110 (H) 05/28/2012 1259   CO2 24 07/11/2017 1144   CO2 28 09/06/2016 1240   GLUCOSE 107 (H) 08/23/2017 0825   GLUCOSE 70 09/06/2016 1240   GLUCOSE 202 (H) 05/28/2012 1259   BUN 80 (HH) 07/11/2017 1144   BUN 60.4 (H) 09/06/2016 1240   CREATININE 4.13 (H) 07/11/2017 1144   CREATININE 3.4 (HH) 09/06/2016 1240   CALCIUM 9.0 07/11/2017 1144   CALCIUM 9.4 09/06/2016 1240  PROT 5.4 (L) 05/04/2017 1346   PROT 5.6 (L) 09/06/2016 1240   ALBUMIN 3.9 05/04/2017 1346   ALBUMIN 3.0 (L) 09/06/2016 1240   AST 19 05/04/2017 1346   AST 29 09/06/2016 1240   ALT 11 05/04/2017 1346   ALT 23 09/06/2016 1240   ALKPHOS 87 05/04/2017 1346   ALKPHOS 99 09/06/2016 1240   BILITOT 0.3 05/04/2017 1346   BILITOT 0.32 09/06/2016 1240   GFRNONAA 10 (L) 07/11/2017 1144   GFRNONAA 14 (L) 11/25/2015 1402   GFRAA 12 (L) 07/11/2017 1144   GFRAA 16 (L) 11/25/2015 1402    No results found for: SPEP, UPEP  Lab Results  Component Value Date   WBC 8.8 09/19/2017   NEUTROABS 4.8 09/19/2017   HGB 10.5 (L) 09/19/2017   HCT 33.2 (L) 09/19/2017   MCV 98.2 09/19/2017   PLT 169 09/19/2017      Chemistry      Component Value Date/Time   NA 138 08/23/2017 0825   NA 140 07/11/2017 1144   NA 143 09/06/2016 1240   K 4.1 08/23/2017 0825   K 4.0 09/06/2016 1240   CL 102 07/11/2017 1144   CL 110 (H) 05/28/2012 1259   CO2 24 07/11/2017 1144   CO2 28  09/06/2016 1240   BUN 80 (HH) 07/11/2017 1144   BUN 60.4 (H) 09/06/2016 1240   CREATININE 4.13 (H) 07/11/2017 1144   CREATININE 3.4 (HH) 09/06/2016 1240      Component Value Date/Time   CALCIUM 9.0 07/11/2017 1144   CALCIUM 9.4 09/06/2016 1240   ALKPHOS 87 05/04/2017 1346   ALKPHOS 99 09/06/2016 1240   AST 19 05/04/2017 1346   AST 29 09/06/2016 1240   ALT 11 05/04/2017 1346   ALT 23 09/06/2016 1240   BILITOT 0.3 05/04/2017 1346   BILITOT 0.32 09/06/2016 1240     All questions were answered. The patient knows to call the clinic with any problems, questions or concerns. No barriers to learning was detected.  I spent 10 minutes counseling the patient face to face. The total time spent in the appointment was 15 minutes and more than 50% was on counseling and review of test results  Heath Lark, MD 09/20/2017 2:23 PM

## 2017-09-20 NOTE — Assessment & Plan Note (Signed)
She has no signs of disease recurrence I will see her back in 12 months with history, physical examination and blood work.

## 2017-09-20 NOTE — Assessment & Plan Note (Signed)
This is likely anemia of chronic disease. The patient denies recent history of bleeding such as epistaxis, hematuria or hematochezia. She is asymptomatic from the anemia. We will observe for now.  

## 2017-09-30 ENCOUNTER — Other Ambulatory Visit: Payer: Self-pay | Admitting: Family Medicine

## 2017-10-02 ENCOUNTER — Ambulatory Visit (INDEPENDENT_AMBULATORY_CARE_PROVIDER_SITE_OTHER): Payer: Medicare Other | Admitting: *Deleted

## 2017-10-02 DIAGNOSIS — I480 Paroxysmal atrial fibrillation: Secondary | ICD-10-CM | POA: Diagnosis not present

## 2017-10-02 DIAGNOSIS — Z7901 Long term (current) use of anticoagulants: Secondary | ICD-10-CM

## 2017-10-02 LAB — POCT INR: INR: 2.2 (ref 2.0–3.0)

## 2017-10-02 NOTE — Patient Instructions (Addendum)
A full discussion of the nature of anticoagulants has been carried out.  A benefit risk analysis has been presented to the patient, so that they understand the justification for choosing anticoagulation at this time. The need for frequent and regular monitoring, precise dosage adjustment and compliance is stressed.  Side effects of potential bleeding are discussed.  The patient should avoid any OTC items containing aspirin or ibuprofen, and should avoid great swings in general diet.  Avoid alcohol consumption.  Call if any signs of abnormal bleeding.  Call [facility] [phone] with any medication changes.   Description   Start taking 1/2 tablet daily except 1 tablet on Mondays and Fridays. Recheck INR in 1 week.  Call with any new medications or procedures-[facility] # [phone] Main [phone]

## 2017-10-03 DIAGNOSIS — I770 Arteriovenous fistula, acquired: Secondary | ICD-10-CM | POA: Diagnosis not present

## 2017-10-03 DIAGNOSIS — I120 Hypertensive chronic kidney disease with stage 5 chronic kidney disease or end stage renal disease: Secondary | ICD-10-CM | POA: Diagnosis not present

## 2017-10-03 DIAGNOSIS — R300 Dysuria: Secondary | ICD-10-CM | POA: Diagnosis not present

## 2017-10-03 DIAGNOSIS — N185 Chronic kidney disease, stage 5: Secondary | ICD-10-CM | POA: Diagnosis not present

## 2017-10-03 DIAGNOSIS — N2581 Secondary hyperparathyroidism of renal origin: Secondary | ICD-10-CM | POA: Diagnosis not present

## 2017-10-09 ENCOUNTER — Ambulatory Visit (INDEPENDENT_AMBULATORY_CARE_PROVIDER_SITE_OTHER): Payer: Medicare Other | Admitting: Internal Medicine

## 2017-10-09 ENCOUNTER — Encounter: Payer: Self-pay | Admitting: *Deleted

## 2017-10-09 ENCOUNTER — Other Ambulatory Visit: Payer: Self-pay | Admitting: Nurse Practitioner

## 2017-10-09 ENCOUNTER — Encounter: Payer: Self-pay | Admitting: Internal Medicine

## 2017-10-09 ENCOUNTER — Ambulatory Visit (INDEPENDENT_AMBULATORY_CARE_PROVIDER_SITE_OTHER): Payer: Medicare Other | Admitting: Pharmacist

## 2017-10-09 ENCOUNTER — Telehealth: Payer: Self-pay | Admitting: *Deleted

## 2017-10-09 VITALS — BP 144/87 | HR 120 | Temp 97.5°F | Wt 158.5 lb

## 2017-10-09 DIAGNOSIS — R110 Nausea: Secondary | ICD-10-CM

## 2017-10-09 DIAGNOSIS — Z006 Encounter for examination for normal comparison and control in clinical research program: Secondary | ICD-10-CM

## 2017-10-09 DIAGNOSIS — Z7901 Long term (current) use of anticoagulants: Secondary | ICD-10-CM | POA: Diagnosis not present

## 2017-10-09 DIAGNOSIS — I480 Paroxysmal atrial fibrillation: Secondary | ICD-10-CM

## 2017-10-09 DIAGNOSIS — Z8719 Personal history of other diseases of the digestive system: Secondary | ICD-10-CM

## 2017-10-09 DIAGNOSIS — A0472 Enterocolitis due to Clostridium difficile, not specified as recurrent: Secondary | ICD-10-CM

## 2017-10-09 LAB — POCT INR: INR: 1.5 — AB (ref 2.0–3.0)

## 2017-10-09 MED ORDER — ONDANSETRON HCL 8 MG PO TABS: 8.0000 mg | ORAL_TABLET | Freq: Three times a day (TID) | ORAL | 1 refills | Status: DC | PRN

## 2017-10-09 NOTE — Progress Notes (Signed)
STROKE~AF Research study Informed Consent Form (IRB Approval date 04/04/2017) signed. The informed consent form, study requirements and expectations were reviewed with the subject and questions and concerns were addressed prior to the signing of the consent form. The subject verbalized understanding of the trail requirements. The subject agreed to continue to participate in the STROKE AF trial and signed the informed consent. The informed consent was obtained prior to performance of any protocol-specific procedures for the subject. A copy of the signed informed consent was given to the subject and a copy was placed in the subject's research binder.

## 2017-10-09 NOTE — Telephone Encounter (Signed)
Prior authorization request initiated via [email or web address] for ondansetron 8 mg. Landis Gandy, RN

## 2017-10-09 NOTE — Patient Instructions (Signed)
Description   Take 1.5 tablet today and 1 tablet tomorrow then start taking 1/2 tablet daily except 1 tablet on Mondays Wednesdays and Fridays. Recheck INR in 1 week.  Call with any new medications or procedures-[facility] Clinic # [phone] Main [phone]

## 2017-10-10 ENCOUNTER — Telehealth: Payer: Self-pay | Admitting: Behavioral Health

## 2017-10-10 NOTE — Telephone Encounter (Signed)
Christine Santos called stating Dr. Baxter Flattery prescribed Zofran 8mg  for her for 20 tablets the price is 100 dollars.  She states in the past she has had it prescribed and it was affordable but her previous prescription was Zofran 4 mg tabs and she got relief. Pricilla Riffle RN

## 2017-10-11 ENCOUNTER — Other Ambulatory Visit: Payer: Self-pay | Admitting: Behavioral Health

## 2017-10-11 DIAGNOSIS — R112 Nausea with vomiting, unspecified: Secondary | ICD-10-CM

## 2017-10-11 DIAGNOSIS — A0472 Enterocolitis due to Clostridium difficile, not specified as recurrent: Secondary | ICD-10-CM

## 2017-10-11 MED ORDER — ONDANSETRON HCL 4 MG PO TABS: 4.0000 mg | ORAL_TABLET | Freq: Three times a day (TID) | ORAL | 1 refills | Status: DC | PRN

## 2017-10-11 NOTE — Telephone Encounter (Signed)
Called Ms. Jabier Mutton and informed her the dosage for her Zofran was changed to 4mg  which was to dosage she was on previously and able to afford.  Ms. Latchford verbalized understanding. Pricilla Riffle RN

## 2017-10-11 NOTE — Telephone Encounter (Signed)
Yes can decrease dose to 4mg 

## 2017-10-12 ENCOUNTER — Other Ambulatory Visit: Payer: Self-pay | Admitting: *Deleted

## 2017-10-12 ENCOUNTER — Telehealth: Payer: Self-pay | Admitting: *Deleted

## 2017-10-12 ENCOUNTER — Ambulatory Visit (INDEPENDENT_AMBULATORY_CARE_PROVIDER_SITE_OTHER): Payer: Medicare Other | Admitting: Vascular Surgery

## 2017-10-12 ENCOUNTER — Encounter: Payer: Self-pay | Admitting: Vascular Surgery

## 2017-10-12 ENCOUNTER — Encounter: Payer: Self-pay | Admitting: *Deleted

## 2017-10-12 ENCOUNTER — Other Ambulatory Visit: Payer: Self-pay

## 2017-10-12 ENCOUNTER — Ambulatory Visit (HOSPITAL_COMMUNITY)
Admission: RE | Admit: 2017-10-12 | Discharge: 2017-10-12 | Disposition: A | Discharge status: Home / Self Care | Payer: Medicare Other | Source: Ambulatory Visit | Attending: Vascular Surgery | Admitting: Vascular Surgery

## 2017-10-12 VITALS — BP 185/95 | HR 80 | Temp 97.9°F | Resp 18 | Ht 65.0 in | Wt 162.0 lb

## 2017-10-12 DIAGNOSIS — Z48812 Encounter for surgical aftercare following surgery on the circulatory system: Secondary | ICD-10-CM | POA: Insufficient documentation

## 2017-10-12 DIAGNOSIS — N184 Chronic kidney disease, stage 4 (severe): Secondary | ICD-10-CM | POA: Insufficient documentation

## 2017-10-12 NOTE — Progress Notes (Signed)
Patient is a 72 year old female who returns for follow-up today.  She underwent first stage basilic fistula August 23, 2017.  She is currently not on hemodialysis but apparently is approaching this.  She is on warfarin now transitioned off of Eliquis.  She is on this for atrial fibrillation.  Denies any numbness or tingling in her left hand.  Past Medical History:  Diagnosis Date  . Anemia 08/29/2011  . Anxiety   . Arthritis    "legs" (01/22/2013)  . CAD (coronary artery disease)    s/p CABG in 1999  . Cataract   . Chronic kidney disease    Sees Dr [name]  . CLL (chronic lymphoblastic leukemia)    Kidney  . Diastolic heart failure ([place])   . GERD (gastroesophageal reflux disease)   . H/O hiatal hernia   . Heart murmur   . Hyperlipidemia   . Hypertension   . 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 PAD (peripheral artery disease) ([place])   . Paroxysmal atrial fibrillation (HCC)    a. identified on ILR as part of StrokeAF study  . Peripheral vascular disease ([place])   . Pneumonia 12/23/2017  . 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 Still has balance issues   Past Surgical History:  Procedure Laterality Date  . ANGIOPLASTY / STENTING FEMORAL Right 09/2010   SFA/notes 11/25/2010 (01/22/2013)  . ANGIOPLASTY / STENTING FEMORAL Left 11/2010   SFA/notes 11/25/2010 (01/22/2013)  . ANGIOPLASTY / STENTING ILIAC     [name] 11/25/2010 (01/22/2013)  . BASCILIC VEIN TRANSPOSITION Left 08/23/2017   Procedure: FIRST STAGE BASILIC VEIN TRANSPOSITION LEFT UPPER EXTREMITY;  Surgeon: [name], MD;  Location: [facility];  Service: Vascular;  Laterality: Left;  . CATARACT EXTRACTION W/ INTRAOCULAR LENS  IMPLANT, BILATERAL Bilateral ?2011  . CHOLECYSTECTOMY  1993  . CORONARY ARTERY BYPASS GRAFT  10/1994   "CABG X3"  . EP IMPLANTABLE DEVICE N/A 09/11/2015   Procedure: Loop Recorder Insertion;  Surgeon: Thompson Grayer, MD;  Location: [facility];  Service: Cardiovascular;  Laterality: N/A;  . HEEL SPUR EXCISION Bilateral 1970's  . LOWER EXTREMITY ANGIOGRAM N/A 01/22/2013   Procedure: LOWER EXTREMITY ANGIOGRAM;  Surgeon: [name], MD;  Location: [facility] C[facility];  Service: Cardiovascular;  Laterality: N/A;  . SHOULDER OPEN ROTATOR CUFF REPAIR Bilateral 1990's   "2 times on 1 side; once on the other"  . TRANSLUMINAL ATHERECTOMY FEMORAL ARTERY Right 01/22/2013   & balloon  . TUBAL LIGATION  1980's  . VIDEO ASSISTED THORACOSCOPY (VATS)/EMPYEMA Left 11/29/2016   Procedure: VIDEO ASSISTED THORACOSCOPY (VATS)/EMPYEMA;  Surgeon: [name], MD;  Location: [facility];  Service: Thoracic;  Laterality: Left;  VIDEO ASSISTED THORACOSCOPY (VATS)/EMPYEMA   Current Outpatient Medications on File Prior to Visit  Medication Sig Dispense Refill  . ACCU-CHEK AVIVA PLUS test strip TEST THREE TIMES A DAY 300 each 3  . allopurinol (ZYLOPRIM) 100 MG tablet TAKE 1 TABLET(100 MG) BY MOUTH DAILY 90 tablet 3  . Ascorbic Acid (VITAMIN C) 100 MG tablet Take 100 mg by mouth daily.    . carboxymethylcellulose (REFRESH PLUS) 0.5 % SOLN Place 1 drop into both eyes 3 (three) times daily as needed (dry eyes).     . ferrous sulfate 325 (65 FE) MG tablet Take 1 tablet (325 mg total) by mouth daily with breakfast.  3  . GNP GARLIC EXTRACT PO Take 1 tablet by mouth daily.     Marland Kitchen  HUMALOG KWIKPEN 100 UNIT/ML KiwkPen Inject 5 Units as directed 3 (three) times daily.  3  . hydrALAZINE (APRESOLINE) 50 MG tablet Take 1 tablet (50 mg total) by mouth 3 (three) times daily. Please make appt with Dr. Irish Lack for June before anymore refills. 1st attempt 270 tablet 0  . LANTUS SOLOSTAR 100 UNIT/ML Solostar Pen ADMINISTER 35 UNITS UNDER THE SKIN DAILY 15 mL 12  . Melatonin 10 MG CAPS Take 10 mg by mouth at bedtime as needed (sleep).     . metoprolol tartrate (LOPRESSOR) 25 MG tablet TAKE 1 TABLET BY MOUTH TWICE DAILY 180 tablet 3  . Multiple Vitamins-Minerals (EYE VITAMINS  PO) Take 1 capsule by mouth daily. Taking one daily from her opthamologist (Dr. Herbert Deaner)     . Multiple Vitamins-Minerals (MULTIVITAMINS THER. W/MINERALS) TABS Take 1 tablet by mouth daily.      Marland Kitchen omega-3 acid ethyl esters (LOVAZA) 1 g capsule Take 2 g by mouth 2 (two) times daily.     . ondansetron (ZOFRAN) 4 MG tablet TAKE 1 TABLET(4 MG) BY MOUTH EVERY 8 HOURS AS NEEDED FOR NAUSEA OR VOMITING 20 tablet 0  . ondansetron (ZOFRAN) 4 MG tablet Take 1 tablet (4 mg total) by mouth every 8 (eight) hours as needed for nausea or vomiting. 20 tablet 1  . pantoprazole (PROTONIX) 40 MG tablet TAKE 1 TABLET BY MOUTH DAILY 90 tablet 3  . PARoxetine (PAXIL) 20 MG tablet TAKE 1 TABLET BY MOUTH EVERY MORNING (Patient taking differently: TAKE 20 mg TABLET BY MOUTH EVERY MORNING) 90 tablet 3  . torsemide (DEMADEX) 100 MG tablet Take 1 tablet (100 mg total) by mouth daily. 135 tablet 3  . traMADol (ULTRAM) 50 MG tablet TAKE 1 TABLET BY MOUTH FOUR TIMES DAILY 360 tablet 1  . traMADol (ULTRAM) 50 MG tablet Take 1 tablet (50 mg total) by mouth every 6 (six) hours as needed for up to 12 doses. 12 tablet 0  . warfarin (COUMADIN) 5 MG tablet Take 1 tablet (5 mg total) by mouth daily. 30 tablet 0   No current facility-administered medications on file prior to visit.     Physical exam:  Vitals:   10/12/17 0856 10/12/17 0858  BP: (!) 184/102 (!) 185/95  Pulse: 80   Resp: 18   Temp: 97.9 F (36.6 C)   TempSrc: Oral   SpO2: 96%   Weight: 162 lb (73.5 kg)   Height: 5\' 5"  (1.651 m)     Left upper extremity: Palpable thrill audible bruit in left arm AV fistula well-healed antecubital incisions  Data: Patient had a duplex ultrasound of her AV fistula today which shows the fistula diameter is 7 to 8 mm throughout most of its course  Assessment: Patient approaching need for long-term hemodialysis.  She needs to have second stage of her basilic vein fistula at this point.  Plan: Second stage basilic vein  transposition scheduled for October 23, 2017.  We will stop her Coumadin a few days prior to this.

## 2017-10-12 NOTE — Telephone Encounter (Signed)
   [facility] Pre-operative Risk Assessment    Request for surgical clearance:  1. What type of surgery is being performed? Second stage BVT   2. When is this surgery scheduled?  10/23/17   3. What type of clearance is required (medical clearance vs. Pharmacy clearance to hold med vs. Both)? Pharmacy  4. Are there any medications that need to be held prior to surgery and how long? Coumadin -5 days preop   5. Practice name and name of physician performing surgery? [facility]-Dr. Oneida Alar   6. What is your office phone number [phone]    7.   What is your office fax number [phone]  8.   Anesthesia type (None, local, MAC, general) ? MAC anesthesia     _________________________________________________________________   (provider comments below)

## 2017-10-12 NOTE — H&P (View-Only) (Signed)
Patient is a 72 year old female who returns for follow-up today.  She underwent first stage basilic fistula August 23, 2017.  She is currently not on hemodialysis but apparently is approaching this.  She is on warfarin now transitioned off of Eliquis.  She is on this for atrial fibrillation.  Denies any numbness or tingling in her left hand.  Past Medical History:  Diagnosis Date  . Anemia 08/29/2011  . Anxiety   . Arthritis    "legs" (01/22/2013)  . CAD (coronary artery disease)    s/p CABG in 1999  . Cataract   . Chronic kidney disease    Sees Dr [name]  . CLL (chronic lymphoblastic leukemia)    Kidney  . Diastolic heart failure ([place])   . GERD (gastroesophageal reflux disease)   . H/O hiatal hernia   . Heart murmur   . Hyperlipidemia   . Hypertension   . 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 PAD (peripheral artery disease) ([place])   . Paroxysmal atrial fibrillation (HCC)    a. identified on ILR as part of StrokeAF study  . Peripheral vascular disease ([place])   . Pneumonia 12/23/2017  . 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 Still has balance issues   Past Surgical History:  Procedure Laterality Date  . ANGIOPLASTY / STENTING FEMORAL Right 09/2010   SFA/notes 11/25/2010 (01/22/2013)  . ANGIOPLASTY / STENTING FEMORAL Left 11/2010   SFA/notes 11/25/2010 (01/22/2013)  . ANGIOPLASTY / STENTING ILIAC     [name] 11/25/2010 (01/22/2013)  . BASCILIC VEIN TRANSPOSITION Left 08/23/2017   Procedure: FIRST STAGE BASILIC VEIN TRANSPOSITION LEFT UPPER EXTREMITY;  Surgeon: [name], MD;  Location: [place];  Service: Vascular;  Laterality: Left;  . CATARACT EXTRACTION W/ INTRAOCULAR LENS  IMPLANT, BILATERAL Bilateral ?2011  . CHOLECYSTECTOMY  1993  . CORONARY ARTERY BYPASS GRAFT  10/1994   "CABG X3"  . EP IMPLANTABLE DEVICE N/A 09/11/2015   Procedure: Loop Recorder Insertion;  Surgeon: Thompson Grayer, MD;  Location: [facility];  Service: Cardiovascular;  Laterality: N/A;  . HEEL SPUR EXCISION Bilateral 1970's  . LOWER EXTREMITY ANGIOGRAM N/A 01/22/2013   Procedure: LOWER EXTREMITY ANGIOGRAM;  Surgeon: [name], MD;  Location: [facility] CATH LAB;  Service: Cardiovascular;  Laterality: N/A;  . SHOULDER OPEN ROTATOR CUFF REPAIR Bilateral 1990's   "2 times on 1 side; once on the other"  . TRANSLUMINAL ATHERECTOMY FEMORAL ARTERY Right 01/22/2013   & balloon  . TUBAL LIGATION  1980's  . VIDEO ASSISTED THORACOSCOPY (VATS)/EMPYEMA Left 11/29/2016   Procedure: VIDEO ASSISTED THORACOSCOPY (VATS)/EMPYEMA;  Surgeon: [name], MD;  Location: [facility];  Service: Thoracic;  Laterality: Left;  VIDEO ASSISTED THORACOSCOPY (VATS)/EMPYEMA   Current Outpatient Medications on File Prior to Visit  Medication Sig Dispense Refill  . ACCU-CHEK AVIVA PLUS test strip TEST THREE TIMES A DAY 300 each 3  . allopurinol (ZYLOPRIM) 100 MG tablet TAKE 1 TABLET(100 MG) BY MOUTH DAILY 90 tablet 3  . Ascorbic Acid (VITAMIN C) 100 MG tablet Take 100 mg by mouth daily.    . carboxymethylcellulose (REFRESH PLUS) 0.5 % SOLN Place 1 drop into both eyes 3 (three) times daily as needed (dry eyes).     . ferrous sulfate 325 (65 FE) MG tablet Take 1 tablet (325 mg total) by mouth daily with breakfast.  3  . GNP GARLIC EXTRACT PO Take 1 tablet by mouth daily.     Marland Kitchen  HUMALOG KWIKPEN 100 UNIT/ML KiwkPen Inject 5 Units as directed 3 (three) times daily.  3  . hydrALAZINE (APRESOLINE) 50 MG tablet Take 1 tablet (50 mg total) by mouth 3 (three) times daily. Please make appt with Dr. Irish Lack for June before anymore refills. 1st attempt 270 tablet 0  . LANTUS SOLOSTAR 100 UNIT/ML Solostar Pen ADMINISTER 35 UNITS UNDER THE SKIN DAILY 15 mL 12  . Melatonin 10 MG CAPS Take 10 mg by mouth at bedtime as needed (sleep).     . metoprolol tartrate (LOPRESSOR) 25 MG tablet TAKE 1 TABLET BY MOUTH TWICE DAILY 180 tablet 3  . Multiple Vitamins-Minerals (EYE VITAMINS  PO) Take 1 capsule by mouth daily. Taking one daily from her opthamologist (Dr. Herbert Deaner)     . Multiple Vitamins-Minerals (MULTIVITAMINS THER. W/MINERALS) TABS Take 1 tablet by mouth daily.      Marland Kitchen omega-3 acid ethyl esters (LOVAZA) 1 g capsule Take 2 g by mouth 2 (two) times daily.     . ondansetron (ZOFRAN) 4 MG tablet TAKE 1 TABLET(4 MG) BY MOUTH EVERY 8 HOURS AS NEEDED FOR NAUSEA OR VOMITING 20 tablet 0  . ondansetron (ZOFRAN) 4 MG tablet Take 1 tablet (4 mg total) by mouth every 8 (eight) hours as needed for nausea or vomiting. 20 tablet 1  . pantoprazole (PROTONIX) 40 MG tablet TAKE 1 TABLET BY MOUTH DAILY 90 tablet 3  . PARoxetine (PAXIL) 20 MG tablet TAKE 1 TABLET BY MOUTH EVERY MORNING (Patient taking differently: TAKE 20 mg TABLET BY MOUTH EVERY MORNING) 90 tablet 3  . torsemide (DEMADEX) 100 MG tablet Take 1 tablet (100 mg total) by mouth daily. 135 tablet 3  . traMADol (ULTRAM) 50 MG tablet TAKE 1 TABLET BY MOUTH FOUR TIMES DAILY 360 tablet 1  . traMADol (ULTRAM) 50 MG tablet Take 1 tablet (50 mg total) by mouth every 6 (six) hours as needed for up to 12 doses. 12 tablet 0  . warfarin (COUMADIN) 5 MG tablet Take 1 tablet (5 mg total) by mouth daily. 30 tablet 0   No current facility-administered medications on file prior to visit.     Physical exam:  Vitals:   10/12/17 0856 10/12/17 0858  BP: (!) 184/102 (!) 185/95  Pulse: 80   Resp: 18   Temp: 97.9 F (36.6 C)   TempSrc: Oral   SpO2: 96%   Weight: 162 lb (73.5 kg)   Height: 5\' 5"  (1.651 m)     Left upper extremity: Palpable thrill audible bruit in left arm AV fistula well-healed antecubital incisions  Data: Patient had a duplex ultrasound of her AV fistula today which shows the fistula diameter is 7 to 8 mm throughout most of its course  Assessment: Patient approaching need for long-term hemodialysis.  She needs to have second stage of her basilic vein fistula at this point.  Plan: Second stage basilic vein  transposition scheduled for October 23, 2017.  We will stop her Coumadin a few days prior to this.

## 2017-10-13 NOTE — Telephone Encounter (Signed)
Pt takes warfarin for afib with CHADS2VASc score of 8 (age, sex, HTN, CHF, CAD, DM, stroke). She has CKD and has been preparing for dialysis however has not yet started.  Pt recently switched from Eliquis to warfarin due to worsening renal function. Would recommend bridging with Lovenox due to afib and prior stroke with CHADS2VASc score of 8. Will route to Dr Irish Lack to confirm since pt is pending dialysis start. She has not used Lovenox before due to recent transition from [employer] to warfarin.

## 2017-10-13 NOTE — Progress Notes (Signed)
RFV: follow up for recurrent cdifficile  Patient ID: Christine Santos, female   DOB: 04/04/1945, 72 y.o.   MRN: [id number]  HPI Christine Santos is a 72yo F with hx of DM, lacunar infarct in 2017, afib, hx of IBS and had a  2 week hospitalization for pneumonia, s/p VATS with prolonged antibiotics this past winter however In late April and early may - nauseated, abdominal cramping, watery diarrhea. With recurrent c.difficile - Her cdifficile pcr was positive in July 8th,and thus Started back on July 16 th- vancomycin now on a taper that she finished 3 days ago. She still has some loose stools mostly after eating. She does state that she has hx of IBS. She also notices poor appetite due to nausea.   Outpatient Encounter Medications as of 10/09/2017  Medication Sig  . ACCU-CHEK AVIVA PLUS test strip TEST THREE TIMES A DAY  . allopurinol (ZYLOPRIM) 100 MG tablet TAKE 1 TABLET(100 MG) BY MOUTH DAILY  . Ascorbic Acid (VITAMIN C) 100 MG tablet Take 100 mg by mouth daily.  . carboxymethylcellulose (REFRESH PLUS) 0.5 % SOLN Place 1 drop into both eyes 3 (three) times daily as needed (dry eyes).   . ferrous sulfate 325 (65 FE) MG tablet Take 1 tablet (325 mg total) by mouth daily with breakfast.  . GNP GARLIC EXTRACT PO Take 1 tablet by mouth daily.   Marland Kitchen HUMALOG KWIKPEN 100 UNIT/ML KiwkPen Inject 5 Units as directed 3 (three) times daily.  . hydrALAZINE (APRESOLINE) 50 MG tablet Take 1 tablet (50 mg total) by mouth 3 (three) times daily. Please make appt with Dr. Irish Lack for June before anymore refills. 1st attempt  . LANTUS SOLOSTAR 100 UNIT/ML Solostar Pen ADMINISTER 35 UNITS UNDER THE SKIN DAILY  . Melatonin 10 MG CAPS Take 10 mg by mouth at bedtime as needed (sleep).   . metoprolol tartrate (LOPRESSOR) 25 MG tablet TAKE 1 TABLET BY MOUTH TWICE DAILY  . Multiple Vitamins-Minerals (EYE VITAMINS PO) Take 1 capsule by mouth daily. Taking one daily from her opthamologist (Dr. Herbert Deaner)   . Multiple Vitamins-Minerals  (MULTIVITAMINS THER. W/MINERALS) TABS Take 1 tablet by mouth daily.    Marland Kitchen omega-3 acid ethyl esters (LOVAZA) 1 g capsule Take 2 g by mouth 2 (two) times daily.   . ondansetron (ZOFRAN) 4 MG tablet TAKE 1 TABLET(4 MG) BY MOUTH EVERY 8 HOURS AS NEEDED FOR NAUSEA OR VOMITING  . pantoprazole (PROTONIX) 40 MG tablet TAKE 1 TABLET BY MOUTH DAILY  . PARoxetine (PAXIL) 20 MG tablet TAKE 1 TABLET BY MOUTH EVERY MORNING (Patient taking differently: TAKE 20 mg TABLET BY MOUTH EVERY MORNING)  . torsemide (DEMADEX) 100 MG tablet Take 1 tablet (100 mg total) by mouth daily.  . traMADol (ULTRAM) 50 MG tablet TAKE 1 TABLET BY MOUTH FOUR TIMES DAILY  . traMADol (ULTRAM) 50 MG tablet Take 1 tablet (50 mg total) by mouth every 6 (six) hours as needed for up to 12 doses.  Marland Kitchen warfarin (COUMADIN) 5 MG tablet Take 1 tablet (5 mg total) by mouth daily.  . [DISCONTINUED] ondansetron (ZOFRAN) 8 MG tablet Take 1 tablet (8 mg total) by mouth every 8 (eight) hours as needed for nausea or vomiting.   No facility-administered encounter medications on file as of 10/09/2017.      Patient Active Problem List   Diagnosis Date Noted  . Long term (current) use of anticoagulants 10/02/2017  . Enteritis due to Clostridium difficile 07/02/2017  . Left hip pain 02/16/2017  . History  of stroke 01/20/2017  . Physical deconditioning 12/30/2016  . Transaminitis 12/23/2016  . Paroxysmal atrial fibrillation ([place]) 10/22/2015  . Sinusitis, chronic 08/05/2015  . 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 Hyperkalemia 06/21/2013  . Diabetic nephropathy ([place]) 05/23/2013  . Diabetic neuropathy ([place]) 11/30/2012  . Macular degeneration 07/04/2012  . Diabetic retinopathy ([place]) 03/20/2012  . Osteopenia 09/22/2011  . Anemia in chronic kidney disease 08/29/2011  . Gout 04/13/2011  . Stress incontinence, female 11/11/2010  . Anxiety 08/18/2010  . Chronic systolic heart failure ([place]) 02/18/2009  . SINUS  BRADYCARDIA 10/29/2008  . Peripheral vascular disease ([place]) 06/18/2008  . CKD (chronic kidney disease) stage 5, GFR less than 15 ml/min (HCC) 07/13/2007  . Chronic lymphocytic leukemia ([place]) 10/13/2006  . 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 HYPERCHOLESTEROLEMIA 03/23/2006  . Essential hypertension 03/23/2006  . Coronary atherosclerosis 03/23/2006  . REFLUX ESOPHAGITIS 03/23/2006  . IRRITABLE BOWEL SYNDROME 03/23/2006  . CYSTOCELE/RECTOCELE/PROLAPSE,UNSPEC. 03/23/2006  . Osteoarthritis, multiple sites 03/23/2006     Health Maintenance Due  Topic Date Due  . INFLUENZA VACCINE  08/24/2017     Review of Systems 12 point ros is negative except for loose stools, no abdominal cramping, but does have some distention and gas, as well as nausea Physical Exam   BP (!) 144/87   Pulse (!) 120   Temp (!) 97.5 F (36.4 C)   Wt 158 lb 8 oz (71.9 kg)   BMI 26.38 kg/m    Physical Exam  Constitutional:  oriented to person, place, and time. appears well-developed and well-nourished. No distress.  HENT: [place]/AT, PERRLA, no scleral icterus Mouth/Throat: Oropharynx is clear and moist. No oropharyngeal exudate.  Cardiovascular: Normal rate, regular rhythm and normal heart sounds. Exam reveals no gallop and no friction rub.  No murmur heard.  Pulmonary/Chest: Effort normal and breath sounds normal. No respiratory distress.  has no wheezes.  Neck = supple, no nuchal rigidity Abdominal: Soft. Bowel sounds are normal.  exhibits no distension. There is no tenderness.  Lymphadenopathy: no cervical adenopathy. No axillary adenopathy Neurological: alert and oriented to person, place, and time.  Skin: Skin is warm and dry. No rash noted. No erythema.  Psychiatric: a normal mood and affect.  behavior is normal.   Lab Results  Component Value Date   HEPBSAB NEG 01/14/2014   No results found for: RPR, LABRPR  CBC Lab Results  Component Value Date   WBC 8.8  09/19/2017   RBC 3.38 (L) 09/19/2017   HGB 10.5 (L) 09/19/2017   HCT 33.2 (L) 09/19/2017   PLT 169 09/19/2017   MCV 98.2 09/19/2017   MCH 31.1 09/19/2017   MCHC 31.6 09/19/2017   RDW 17.2 (H) 09/19/2017   LYMPHSABS 2.9 09/19/2017   MONOABS 0.7 09/19/2017   EOSABS 0.3 09/19/2017    BMET Lab Results  Component Value Date   NA 138 08/23/2017   K 4.1 08/23/2017   CL 102 07/11/2017   CO2 24 07/11/2017   GLUCOSE 107 (H) 08/23/2017   BUN 80 (HH) 07/11/2017   CREATININE 4.13 (H) 07/11/2017   CALCIUM 9.0 07/11/2017   GFRNONAA 10 (L) 07/11/2017   GFRAA 12 (L) 07/11/2017      Assessment and Plan  Nausea = will give rx for zofran  Bloating = can try simethicone to see if any improvement  Postprandial diarrhea= continue to monitor, may not necessarily be a relapse of cdifficile. If she has worsening watery diarrhea> 4+ day,  then we will retest for cdifficile and discuss need for FMT as potential option .

## 2017-10-16 ENCOUNTER — Ambulatory Visit: Payer: Medicare Other | Admitting: *Deleted

## 2017-10-16 DIAGNOSIS — Z7901 Long term (current) use of anticoagulants: Secondary | ICD-10-CM

## 2017-10-16 DIAGNOSIS — I480 Paroxysmal atrial fibrillation: Secondary | ICD-10-CM | POA: Diagnosis not present

## 2017-10-16 LAB — POCT INR: INR: 2.1 (ref 2.0–3.0)

## 2017-10-16 MED ORDER — ENOXAPARIN SODIUM 80 MG/0.8ML ~~LOC~~ SOLN: 80.0000 mg | SUBCUTANEOUS | 1 refills | Status: DC

## 2017-10-16 NOTE — Telephone Encounter (Signed)
We will plan to dose her at 1mg /kg once a day due to CrCl < 30. Thanks!

## 2017-10-16 NOTE — Telephone Encounter (Signed)
   Primary Cardiologist: Larae Grooms, MD  Chart reviewed as part of pre-operative protocol coverage. Pre-op question of anticoagulation has been answered. Dr. Irish Lack does suggest bridging with Lovenox. The pharmacy team will see the patient today to handle this in Coumadin clinic.  I will route this recommendation to the requesting party via Epic fax function and remove from pre-op pool.  Please call with questions.  Charlie Pitter, PA-C 10/16/2017, 1:55 PM

## 2017-10-16 NOTE — Patient Instructions (Addendum)
Description   Please refer to patient instructions. Continue taking 1/2 tablet daily except 1 tablet on Mondays Wednesdays and Fridays. Recheck INR in 1 week after procedure.  Call with any new medications or procedures-[facility] # [phone] Main [phone]    10/17/17: Last dose of Coumadin.  10/18/17: No Coumadin or Lovenox.  10/19/17: Inject Lovenox 80mg  in the fatty abdominal tissue at least 2 inches from the belly button at 8pm rotate sites. No Coumadin.  10/20/17: Inject Lovenox in the fatty tissue at 8pm. No Coumadin.  10/21/17: Inject Lovenox in the fatty tissue at 8pm. No Coumadin.  10/22/17: No Lovenox and No Coumadin.  10/23/17: Procedure Day - No Lovenox -Resume Coumadin in the evening or as directed by doctor (take an extra half tablet with usual dose for 2 days then resume normal dose).  10/24/17: Resume Lovenox inject in the fatty tissue at 8am take Coumadin.  10/25/17: Inject Lovenox in the fatty tissue every at 8am and take Coumadin.  10/26/17: Inject Lovenox in the fatty tissue at 8am and take Coumadin.  10/27/17: Inject Lovenox in the fatty tissue at 8am and take Coumadin.  10/28/17: Inject Lovenox in the fatty tissue at 8am and take Coumadin.  10/29/17: Inject Lovenox in the fatty tissue at 8am and take Coumadin.  10/30/17: Inject Lovenox in the fatty tissue at 8am and Coumadin appt to check INR.

## 2017-10-16 NOTE — Telephone Encounter (Signed)
I agree with bridging.  Not sure how the lovenox dose would be adjusted given her renal function.

## 2017-10-17 ENCOUNTER — Other Ambulatory Visit: Payer: Self-pay | Admitting: Family Medicine

## 2017-10-17 ENCOUNTER — Other Ambulatory Visit: Payer: Self-pay | Admitting: Interventional Cardiology

## 2017-10-17 DIAGNOSIS — M1000 Idiopathic gout, unspecified site: Secondary | ICD-10-CM

## 2017-10-18 ENCOUNTER — Other Ambulatory Visit: Payer: Self-pay

## 2017-10-18 ENCOUNTER — Encounter: Payer: Self-pay | Admitting: Family Medicine

## 2017-10-18 ENCOUNTER — Ambulatory Visit (INDEPENDENT_AMBULATORY_CARE_PROVIDER_SITE_OTHER): Payer: Medicare Other | Admitting: Family Medicine

## 2017-10-18 VITALS — BP 118/68 | HR 83 | Temp 97.6°F | Ht 65.0 in | Wt 161.6 lb

## 2017-10-18 DIAGNOSIS — F419 Anxiety disorder, unspecified: Secondary | ICD-10-CM

## 2017-10-18 DIAGNOSIS — N185 Chronic kidney disease, stage 5: Secondary | ICD-10-CM

## 2017-10-18 DIAGNOSIS — E1121 Type 2 diabetes mellitus with diabetic nephropathy: Secondary | ICD-10-CM | POA: Diagnosis not present

## 2017-10-18 DIAGNOSIS — E1149 Type 2 diabetes mellitus with other diabetic neurological complication: Secondary | ICD-10-CM

## 2017-10-18 DIAGNOSIS — E7800 Pure hypercholesterolemia, unspecified: Secondary | ICD-10-CM

## 2017-10-18 LAB — POCT GLYCOSYLATED HEMOGLOBIN (HGB A1C): HbA1c, POC (controlled diabetic range): 6.7 % (ref 0.0–7.0)

## 2017-10-18 MED ORDER — ROSUVASTATIN CALCIUM 20 MG PO TABS: 20.0000 mg | ORAL_TABLET | Freq: Every day | ORAL | 6 refills | Status: DC

## 2017-10-18 MED ORDER — PAROXETINE HCL 40 MG PO TABS: 40.0000 mg | ORAL_TABLET | ORAL | 3 refills | Status: DC

## 2017-10-18 NOTE — Patient Instructions (Signed)
You seem to be in pretty decent shape.   I will call with blood test results. I will increase your paxil to 40 mg.  I hope that helps. Have fun on vacation.  I sent in a new prescription for cholesterol lower medicine.  Let me know if you start having muscle pain. Your diabetes is in good control.

## 2017-10-18 NOTE — Assessment & Plan Note (Addendum)
Statin stopped 11/18 due to elevated CK.  Never rechallenged.   Rx crestor.Warned about myalgias.

## 2017-10-19 ENCOUNTER — Encounter: Payer: Self-pay | Admitting: Family Medicine

## 2017-10-19 LAB — BASIC METABOLIC PANEL
BUN/Creatinine Ratio: 20 (ref 12–28)
BUN: 95 mg/dL (ref 8–27)
CO2: 24 mmol/L (ref 20–29)
CREATININE: 4.65 mg/dL — AB (ref 0.57–1.00)
Calcium: 9 mg/dL (ref 8.7–10.3)
Chloride: 98 mmol/L (ref 96–106)
GFR calc Af Amer: 10 mL/min/{1.73_m2} — ABNORMAL LOW (ref >59)
GFR calc non Af Amer: 9 mL/min/{1.73_m2} — ABNORMAL LOW (ref >59)
GLUCOSE: 101 mg/dL — AB (ref 65–99)
Potassium: 4.2 mmol/L (ref 3.5–5.2)
SODIUM: 141 mmol/L (ref 134–144)

## 2017-10-19 NOTE — Assessment & Plan Note (Signed)
Check BMP.  No signs suggesting an urgent need of dialysis.  Creat is stable and K+ is normal.  No change.

## 2017-10-19 NOTE — Assessment & Plan Note (Signed)
And depression.  Trial of paxil 40 mg

## 2017-10-19 NOTE — Assessment & Plan Note (Signed)
Good control of A1C No changes.

## 2017-10-19 NOTE — Assessment & Plan Note (Signed)
Good control of A1C.  No changes.

## 2017-10-19 NOTE — Progress Notes (Signed)
   Subjective:    Patient ID: Christine Santos, female    DOB: [birth year], 72 y.o.   MRN: [id number]  HPI Routine follow up of several chronic issues. 1. DM A1C today remains at goal.  No complaints. 2. Secondary prevention of CAD.  Not on statin.  Looking back, statin DC~1 year ago during an acute illness in which the CK was elevated.  The specific nature of that illness was never clarified.  She has not yet been rechallenged with statin. 3. 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  Not on dialysis.  No confusion.  Some nausea.  Denies severe leg swelling. 4. Depression.  She is going through a lot.  Would like to try higher dose of her antidepressant.  Also has vacation planned, which should help.  No SI or HI.    Review of Systems     Objective:   Physical Exam  Lungs clear Cardiac RRR without m or g Abd benign Ext 1+ bilateral edema.        Assessment & Plan:

## 2017-10-20 ENCOUNTER — Other Ambulatory Visit: Payer: Self-pay

## 2017-10-20 ENCOUNTER — Telehealth: Payer: Self-pay

## 2017-10-20 ENCOUNTER — Encounter (HOSPITAL_COMMUNITY): Payer: Self-pay | Admitting: *Deleted

## 2017-10-20 NOTE — Telephone Encounter (Signed)
Called patient to see how she is feeling. Would like to ask patient if she has had any additional issues picking up her Ondansetron 4 mg tab from pharmacy. Would also like to know if patient has had any more episodes of nausea since 9/17. Left voicemail asking patient to call office back when available.   PA Denied for Ondansetron 4 mg tab. Medication is not covered under patients insurance plan. Will inform Dr. Baxter Flattery.

## 2017-10-20 NOTE — Progress Notes (Signed)
Christine Santos reports that CBG dropped this am, she did not check it , she took 2 Glucose tabs and CBG after tabs CBG was 100. I instructed patient to check CBG after awaking and every 2 hours until arrival  to the hospital.  I Instructed patient if CBG is less than 70 to take 4 Glucose Tablets . Recheck CBG in 15 minutes then call pre- op desk at [phone] for further instructions.

## 2017-10-23 ENCOUNTER — Encounter (HOSPITAL_COMMUNITY)
Admission: RE | Disposition: A | Discharge status: Home / Self Care | Payer: Self-pay | Source: Ambulatory Visit | Attending: Vascular Surgery

## 2017-10-23 ENCOUNTER — Ambulatory Visit (HOSPITAL_COMMUNITY): Payer: Medicare Other | Admitting: Anesthesiology

## 2017-10-23 ENCOUNTER — Ambulatory Visit (HOSPITAL_COMMUNITY)
Admission: RE | Admit: 2017-10-23 | Discharge: 2017-10-23 | Disposition: A | Discharge status: Home / Self Care | Payer: Medicare Other | Source: Ambulatory Visit | Attending: Vascular Surgery | Admitting: Vascular Surgery

## 2017-10-23 ENCOUNTER — Encounter (HOSPITAL_COMMUNITY): Payer: Self-pay | Admitting: Urology

## 2017-10-23 ENCOUNTER — Other Ambulatory Visit: Payer: Self-pay

## 2017-10-23 DIAGNOSIS — R011 Cardiac murmur, unspecified: Secondary | ICD-10-CM | POA: Insufficient documentation

## 2017-10-23 DIAGNOSIS — Z856 Personal history of leukemia: Secondary | ICD-10-CM | POA: Insufficient documentation

## 2017-10-23 DIAGNOSIS — E785 Hyperlipidemia, unspecified: Secondary | ICD-10-CM | POA: Insufficient documentation

## 2017-10-23 DIAGNOSIS — I2510 Atherosclerotic heart disease of native coronary artery without angina pectoris: Secondary | ICD-10-CM | POA: Insufficient documentation

## 2017-10-23 DIAGNOSIS — I130 Hypertensive heart and chronic kidney disease with heart failure and stage 1 through stage 4 chronic kidney disease, or unspecified chronic kidney disease: Secondary | ICD-10-CM | POA: Insufficient documentation

## 2017-10-23 DIAGNOSIS — Z7901 Long term (current) use of anticoagulants: Secondary | ICD-10-CM | POA: Diagnosis not present

## 2017-10-23 DIAGNOSIS — Z794 Long term (current) use of insulin: Secondary | ICD-10-CM | POA: Insufficient documentation

## 2017-10-23 DIAGNOSIS — N189 Chronic kidney disease, unspecified: Secondary | ICD-10-CM | POA: Diagnosis not present

## 2017-10-23 DIAGNOSIS — K219 Gastro-esophageal reflux disease without esophagitis: Secondary | ICD-10-CM | POA: Insufficient documentation

## 2017-10-23 DIAGNOSIS — E1122 Type 2 diabetes mellitus with diabetic chronic kidney disease: Secondary | ICD-10-CM | POA: Insufficient documentation

## 2017-10-23 DIAGNOSIS — Z79899 Other long term (current) drug therapy: Secondary | ICD-10-CM | POA: Insufficient documentation

## 2017-10-23 DIAGNOSIS — I5032 Chronic diastolic (congestive) heart failure: Secondary | ICD-10-CM | POA: Diagnosis not present

## 2017-10-23 DIAGNOSIS — E1151 Type 2 diabetes mellitus with diabetic peripheral angiopathy without gangrene: Secondary | ICD-10-CM | POA: Insufficient documentation

## 2017-10-23 DIAGNOSIS — M1990 Unspecified osteoarthritis, unspecified site: Secondary | ICD-10-CM | POA: Insufficient documentation

## 2017-10-23 DIAGNOSIS — I480 Paroxysmal atrial fibrillation: Secondary | ICD-10-CM | POA: Insufficient documentation

## 2017-10-23 DIAGNOSIS — I69398 Other sequelae of cerebral infarction: Secondary | ICD-10-CM | POA: Insufficient documentation

## 2017-10-23 DIAGNOSIS — Z885 Allergy status to narcotic agent status: Secondary | ICD-10-CM | POA: Diagnosis not present

## 2017-10-23 DIAGNOSIS — Z880 Allergy status to penicillin: Secondary | ICD-10-CM | POA: Insufficient documentation

## 2017-10-23 DIAGNOSIS — Z951 Presence of aortocoronary bypass graft: Secondary | ICD-10-CM | POA: Diagnosis not present

## 2017-10-23 DIAGNOSIS — F419 Anxiety disorder, unspecified: Secondary | ICD-10-CM | POA: Insufficient documentation

## 2017-10-23 DIAGNOSIS — I120 Hypertensive chronic kidney disease with stage 5 chronic kidney disease or end stage renal disease: Secondary | ICD-10-CM | POA: Diagnosis not present

## 2017-10-23 DIAGNOSIS — N185 Chronic kidney disease, stage 5: Secondary | ICD-10-CM | POA: Diagnosis not present

## 2017-10-23 DIAGNOSIS — D631 Anemia in chronic kidney disease: Secondary | ICD-10-CM | POA: Diagnosis not present

## 2017-10-23 HISTORY — PX: BASCILIC VEIN TRANSPOSITION: SHX5742

## 2017-10-23 HISTORY — DX: Gout, unspecified: M10.9

## 2017-10-23 LAB — PROTIME-INR
INR: 1.17
Prothrombin Time: 14.8 seconds (ref 11.4–15.2)

## 2017-10-23 LAB — POCT I-STAT 4, (NA,K, GLUC, HGB,HCT)
Glucose, Bld: 125 mg/dL — ABNORMAL HIGH (ref 70–99)
HEMATOCRIT: 31 % — AB (ref 36.0–46.0)
Hemoglobin: 10.5 g/dL — ABNORMAL LOW (ref 12.0–15.0)
POTASSIUM: 3.8 mmol/L (ref 3.5–5.1)
SODIUM: 138 mmol/L (ref 135–145)

## 2017-10-23 LAB — GLUCOSE, CAPILLARY
GLUCOSE-CAPILLARY: 111 mg/dL — AB (ref 70–99)
GLUCOSE-CAPILLARY: 139 mg/dL — AB (ref 70–99)
Glucose-Capillary: 139 mg/dL — ABNORMAL HIGH (ref 70–99)
Glucose-Capillary: 136 mg/dL — ABNORMAL HIGH (ref 70–99)

## 2017-10-23 SURGERY — TRANSPOSITION, VEIN, BASILIC
Anesthesia: General | Laterality: Left

## 2017-10-23 MED ORDER — VANCOMYCIN HCL IN DEXTROSE 1-5 GM/200ML-% IV SOLN
1000.0000 mg | INTRAVENOUS | Status: AC
  Administered 2017-10-23: 1000 mg via INTRAVENOUS
  Filled 2017-10-23: qty 200

## 2017-10-23 MED ORDER — 0.9 % SODIUM CHLORIDE (POUR BTL) OPTIME
TOPICAL | Status: DC | PRN
  Administered 2017-10-23: 1000 mL

## 2017-10-23 MED ORDER — CHLORHEXIDINE GLUCONATE 4 % EX LIQD: 60.0000 mL | Freq: Once | CUTANEOUS | Status: DC

## 2017-10-23 MED ORDER — FENTANYL CITRATE (PF) 100 MCG/2ML IJ SOLN
25.0000 ug | INTRAMUSCULAR | Status: DC | PRN
  Administered 2017-10-23: 25 ug via INTRAVENOUS

## 2017-10-23 MED ORDER — DEXAMETHASONE SODIUM PHOSPHATE 4 MG/ML IJ SOLN
INTRAMUSCULAR | Status: DC | PRN
  Administered 2017-10-23: 4 mg via INTRAVENOUS

## 2017-10-23 MED ORDER — SODIUM CHLORIDE 0.9 % IV SOLN
INTRAVENOUS | Status: DC | PRN
  Administered 2017-10-23: 12:00:00

## 2017-10-23 MED ORDER — LIDOCAINE 2% (20 MG/ML) 5 ML SYRINGE
INTRAMUSCULAR | Status: AC
  Filled 2017-10-23: qty 5

## 2017-10-23 MED ORDER — SODIUM CHLORIDE 0.9 % IV SOLN
INTRAVENOUS | Status: DC | PRN
  Administered 2017-10-23: 25 ug/min via INTRAVENOUS

## 2017-10-23 MED ORDER — FENTANYL CITRATE (PF) 100 MCG/2ML IJ SOLN
INTRAMUSCULAR | Status: DC | PRN
  Administered 2017-10-23 (×3): 25 ug via INTRAVENOUS
  Administered 2017-10-23 (×2): 50 ug via INTRAVENOUS

## 2017-10-23 MED ORDER — PROPOFOL 10 MG/ML IV BOLUS
INTRAVENOUS | Status: DC | PRN
  Administered 2017-10-23: 140 mg via INTRAVENOUS

## 2017-10-23 MED ORDER — ONDANSETRON HCL 4 MG/2ML IJ SOLN
INTRAMUSCULAR | Status: DC | PRN
  Administered 2017-10-23: 4 mg via INTRAVENOUS

## 2017-10-23 MED ORDER — MIDAZOLAM HCL 5 MG/5ML IJ SOLN
INTRAMUSCULAR | Status: DC | PRN
  Administered 2017-10-23: 2 mg via INTRAVENOUS

## 2017-10-23 MED ORDER — FENTANYL CITRATE (PF) 100 MCG/2ML IJ SOLN
INTRAMUSCULAR | Status: AC
  Filled 2017-10-23: qty 2

## 2017-10-23 MED ORDER — HEPARIN SODIUM (PORCINE) 1000 UNIT/ML IJ SOLN
INTRAMUSCULAR | Status: DC | PRN
  Administered 2017-10-23: 7000 [IU] via INTRAVENOUS

## 2017-10-23 MED ORDER — DEXAMETHASONE SODIUM PHOSPHATE 10 MG/ML IJ SOLN
INTRAMUSCULAR | Status: AC
  Filled 2017-10-23: qty 1

## 2017-10-23 MED ORDER — SODIUM CHLORIDE 0.9 % IV SOLN
INTRAVENOUS | Status: AC
  Filled 2017-10-23: qty 1.2

## 2017-10-23 MED ORDER — PHENYLEPHRINE 40 MCG/ML (10ML) SYRINGE FOR IV PUSH (FOR BLOOD PRESSURE SUPPORT)
PREFILLED_SYRINGE | INTRAVENOUS | Status: AC
  Filled 2017-10-23: qty 10

## 2017-10-23 MED ORDER — SODIUM CHLORIDE 0.9 % IV SOLN
INTRAVENOUS | Status: DC
  Administered 2017-10-23 (×2): via INTRAVENOUS

## 2017-10-23 MED ORDER — LIDOCAINE HCL (CARDIAC) PF 100 MG/5ML IV SOSY
PREFILLED_SYRINGE | INTRAVENOUS | Status: DC | PRN
  Administered 2017-10-23: 100 mg via INTRAVENOUS

## 2017-10-23 MED ORDER — FENTANYL CITRATE (PF) 250 MCG/5ML IJ SOLN
INTRAMUSCULAR | Status: AC
  Filled 2017-10-23: qty 5

## 2017-10-23 MED ORDER — HEPARIN SODIUM (PORCINE) 1000 UNIT/ML IJ SOLN
INTRAMUSCULAR | Status: AC
  Filled 2017-10-23: qty 1

## 2017-10-23 MED ORDER — TRAMADOL HCL 50 MG PO TABS: 50.0000 mg | ORAL_TABLET | Freq: Four times a day (QID) | ORAL | 0 refills | Status: DC | PRN

## 2017-10-23 MED ORDER — PHENYLEPHRINE 40 MCG/ML (10ML) SYRINGE FOR IV PUSH (FOR BLOOD PRESSURE SUPPORT)
PREFILLED_SYRINGE | INTRAVENOUS | Status: DC | PRN
  Administered 2017-10-23 (×7): 80 ug via INTRAVENOUS

## 2017-10-23 MED ORDER — ONDANSETRON HCL 4 MG/2ML IJ SOLN: 4.0000 mg | Freq: Once | INTRAMUSCULAR | Status: DC | PRN

## 2017-10-23 MED ORDER — ONDANSETRON HCL 4 MG/2ML IJ SOLN
INTRAMUSCULAR | Status: AC
  Filled 2017-10-23: qty 2

## 2017-10-23 MED ORDER — MIDAZOLAM HCL 2 MG/2ML IJ SOLN
INTRAMUSCULAR | Status: AC
  Filled 2017-10-23: qty 2

## 2017-10-23 MED ORDER — PROTAMINE SULFATE 10 MG/ML IV SOLN
INTRAVENOUS | Status: AC
  Filled 2017-10-23: qty 5

## 2017-10-23 MED ORDER — PROTAMINE SULFATE 10 MG/ML IV SOLN
INTRAVENOUS | Status: DC | PRN
  Administered 2017-10-23 (×3): 10 mg via INTRAVENOUS
  Administered 2017-10-23: 5 mg via INTRAVENOUS
  Administered 2017-10-23: 10 mg via INTRAVENOUS
  Administered 2017-10-23: 5 mg via INTRAVENOUS
  Administered 2017-10-23 (×2): 10 mg via INTRAVENOUS

## 2017-10-23 SURGICAL SUPPLY — 39 items
ARMBAND PINK RESTRICT EXTREMIT (MISCELLANEOUS) ×2 IMPLANT
CANISTER SUCT 3000ML PPV (MISCELLANEOUS) ×2 IMPLANT
CANNULA VESSEL 3MM 2 BLNT TIP (CANNULA) ×2 IMPLANT
CLIP VESOCCLUDE MED 24/CT (CLIP) ×2 IMPLANT
CLIP VESOCCLUDE SM WIDE 24/CT (CLIP) ×2 IMPLANT
COVER PROBE W GEL 5X96 (DRAPES) IMPLANT
DECANTER SPIKE VIAL GLASS SM (MISCELLANEOUS) ×2 IMPLANT
DERMABOND ADVANCED (GAUZE/BANDAGES/DRESSINGS) ×1
DERMABOND ADVANCED .7 DNX12 (GAUZE/BANDAGES/DRESSINGS) ×1 IMPLANT
DRAIN PENROSE 1/4X12 LTX STRL (WOUND CARE) IMPLANT
ELECT REM PT RETURN 9FT ADLT (ELECTROSURGICAL) ×2
ELECTRODE REM PT RTRN 9FT ADLT (ELECTROSURGICAL) ×1 IMPLANT
GAUZE SPONGE 4X4 16PLY XRAY LF (GAUZE/BANDAGES/DRESSINGS) ×2 IMPLANT
GLOVE BIO SURGEON STRL SZ7 (GLOVE) ×6 IMPLANT
GLOVE BIO SURGEON STRL SZ7.5 (GLOVE) ×2 IMPLANT
GLOVE BIOGEL PI IND STRL 7.0 (GLOVE) ×4 IMPLANT
GLOVE BIOGEL PI IND STRL 7.5 (GLOVE) ×1 IMPLANT
GLOVE BIOGEL PI INDICATOR 7.0 (GLOVE) ×4
GLOVE BIOGEL PI INDICATOR 7.5 (GLOVE) ×1
GLOVE ECLIPSE 7.0 STRL STRAW (GLOVE) ×2 IMPLANT
GOWN STRL REUS W/ TWL LRG LVL3 (GOWN DISPOSABLE) ×4 IMPLANT
GOWN STRL REUS W/TWL LRG LVL3 (GOWN DISPOSABLE) ×4
HEMOSTAT SPONGE AVITENE ULTRA (HEMOSTASIS) IMPLANT
KIT BASIN OR (CUSTOM PROCEDURE TRAY) ×2 IMPLANT
KIT TURNOVER KIT B (KITS) ×2 IMPLANT
LOOP VESSEL MINI RED (MISCELLANEOUS) IMPLANT
NS IRRIG 1000ML POUR BTL (IV SOLUTION) ×2 IMPLANT
PACK CV ACCESS (CUSTOM PROCEDURE TRAY) ×2 IMPLANT
PAD ARMBOARD 7.5X6 YLW CONV (MISCELLANEOUS) ×4 IMPLANT
SUT PROLENE 6 0 BV (SUTURE) ×2 IMPLANT
SUT PROLENE 7 0 BV 1 (SUTURE) ×6 IMPLANT
SUT SILK 2 0 SH (SUTURE) IMPLANT
SUT VIC AB 3-0 SH 27 (SUTURE) ×3
SUT VIC AB 3-0 SH 27X BRD (SUTURE) ×3 IMPLANT
SUT VIC AB 4-0 PS2 18 (SUTURE) ×2 IMPLANT
SUT VICRYL 4-0 PS2 18IN ABS (SUTURE) ×6 IMPLANT
TOWEL GREEN STERILE (TOWEL DISPOSABLE) ×2 IMPLANT
UNDERPAD 30X30 (UNDERPADS AND DIAPERS) ×2 IMPLANT
WATER STERILE IRR 1000ML POUR (IV SOLUTION) ×2 IMPLANT

## 2017-10-23 NOTE — Transfer of Care (Signed)
Immediate Anesthesia Transfer of Care Note  Patient: Christine Santos  Procedure(s) Performed: SECOND STAGE BASILIC VEIN TRANSPOSITION LEFT ARM (Left )  Patient Location: PACU  Anesthesia Type:General  Level of Consciousness: awake, oriented and patient cooperative  Airway & Oxygen Therapy: Patient Spontanous Breathing and Patient connected to nasal cannula oxygen  Post-op Assessment: Report given to RN and Post -op Vital signs reviewed and stable  Post vital signs: Reviewed  Last Vitals:  Vitals Value Taken Time  BP 143/47 10/23/2017  2:59 PM  Temp    Pulse 122 10/23/2017  3:02 PM  Resp 18 10/23/2017  3:02 PM  SpO2 94 % 10/23/2017  3:02 PM  Vitals shown include unvalidated device data.  Last Pain:  Vitals:   10/23/17 0825  TempSrc:   PainSc: 0-No pain      Patients Stated Pain Goal: 0 (48/47/20 7218)  Complications: No apparent anesthesia complications

## 2017-10-23 NOTE — Interval H&P Note (Signed)
History and Physical Interval Note:  10/23/2017 12:19 PM  Christine Santos  has presented today for surgery, with the diagnosis of CHRONIC KIDNEY DISEASE FOR HEMODIALYSIS ACCESS  The various methods of treatment have been discussed with the patient and family. After consideration of risks, benefits and other options for treatment, the patient has consented to  Procedure(s): SECOND STAGE BASILIC VEIN TRANSPOSITION LEFT ARM (Left) as a surgical intervention .  The patient's history has been reviewed, patient examined, no change in status, stable for surgery.  I have reviewed the patient's chart and labs.  Questions were answered to the patient's satisfaction.     Ruta Hinds

## 2017-10-23 NOTE — Anesthesia Postprocedure Evaluation (Signed)
Anesthesia Post Note  Patient: Christine Santos  Procedure(s) Performed: SECOND STAGE BASILIC VEIN TRANSPOSITION LEFT ARM (Left )     Patient location during evaluation: PACU Anesthesia Type: General Level of consciousness: awake and alert Pain management: pain level controlled Vital Signs Assessment: post-procedure vital signs reviewed and stable Respiratory status: spontaneous breathing, nonlabored ventilation, respiratory function stable and patient connected to nasal cannula oxygen Cardiovascular status: blood pressure returned to baseline and stable Postop Assessment: no apparent nausea or vomiting Anesthetic complications: no    Last Vitals:  Vitals:   10/23/17 1559 10/23/17 1600  BP: 133/69   Pulse: (!) 106 (!) 107  Resp: 14 17  Temp:  (!) 36.4 C  SpO2: 93% 92%    Last Pain:  Vitals:   10/23/17 1515  TempSrc:   PainSc: 0-No pain                 Donat Humble COKER

## 2017-10-23 NOTE — Anesthesia Preprocedure Evaluation (Addendum)
Anesthesia Evaluation  Patient identified by MRN, date of birth, ID band Patient awake    Reviewed: Allergy & Precautions, NPO status , Patient's Chart, lab work & pertinent test results  Airway Mallampati: II  TM Distance: >3 FB Neck ROM: Full    Dental  (+) Edentulous Upper, Dental Advisory Given   Pulmonary    breath sounds clear to auscultation       Cardiovascular hypertension, Pt. on medications and Pt. on home beta blockers + CAD and + CABG  + dysrhythmias Atrial Fibrillation  Rhythm:Irregular Rate:Normal     Neuro/Psych    GI/Hepatic   Endo/Other  diabetes, Type 2  Renal/GU Renal disease     Musculoskeletal   Abdominal   Peds  Hematology   Anesthesia Other Findings   Reproductive/Obstetrics                           Anesthesia Physical Anesthesia Plan  ASA: III  Anesthesia Plan: General   Post-op Pain Management:    Induction: Intravenous  PONV Risk Score and Plan: Ondansetron  Airway Management Planned: LMA  Additional Equipment:   Intra-op Plan:   Post-operative Plan:   Informed Consent: I have reviewed the patients History and Physical, chart, labs and discussed the procedure including the risks, benefits and alternatives for the proposed anesthesia with the patient or authorized representative who has indicated his/her understanding and acceptance.   Dental advisory given  Plan Discussed with: Anesthesiologist and CRNA  Anesthesia Plan Comments:         Anesthesia Quick Evaluation

## 2017-10-23 NOTE — Discharge Instructions (Signed)
° °  [facility] ° °Discharge Instructions ° °AV Fistula or Graft Surgery for Dialysis Access ° °Please refer to the following instructions for your post-procedure care. Your surgeon or physician assistant will discuss any changes with you. ° °Activity ° °You may drive the day following your surgery, if you are comfortable and no longer taking prescription pain medication. Resume full activity as the soreness in your incision resolves. ° °Bathing/Showering ° °You may shower after you go home. Keep your incision dry for 48 hours. Do not soak in a bathtub, hot tub, or swim until the incision heals completely. You may not shower if you have a hemodialysis catheter. ° °Incision Care ° °Clean your incision with mild soap and water after 48 hours. Pat the area dry with a clean towel. You do not need a bandage unless otherwise instructed. Do not apply any ointments or creams to your incision. You may have skin glue on your incision. Do not peel it off. It will come off on its own in about one week. Your arm may swell a bit after surgery. To reduce swelling use pillows to elevate your arm so it is above your heart. Your doctor will tell you if you need to lightly wrap your arm with an ACE bandage. ° °Diet ° °Resume your normal diet. There are not special food restrictions following this procedure. In order to heal from your surgery, it is CRITICAL to get adequate nutrition. Your body requires vitamins, minerals, and protein. Vegetables are the best source of vitamins and minerals. Vegetables also provide the perfect balance of protein. Processed food has little nutritional value, so try to avoid this. ° °Medications ° °Resume taking all of your medications. If your incision is causing pain, you may take over-the counter pain relievers such as acetaminophen (Tylenol). If you were prescribed a stronger pain medication, please be aware these medications can cause nausea and constipation. Prevent  nausea by taking the medication with a snack or meal. Avoid constipation by drinking plenty of fluids and eating foods with high amount of fiber, such as fruits, vegetables, and grains. Do not take Tylenol if you are taking prescription pain medications. ° ° ° ° °Follow up °Your surgeon may want to see you in the office following your access surgery. If so, this will be arranged at the time of your surgery. ° °Please call us immediately for any of the following conditions: ° °Increased pain, redness, drainage (pus) from your incision site °Fever of 101 degrees or higher °Severe or worsening pain at your incision site °Hand pain or numbness. ° °Reduce your risk of vascular disease: ° °Stop smoking. If you would like help, call [facility] at 1-800-QUIT-NOW (1-800-784-8669) or [place] at 336-586-4000 ° °Manage your cholesterol °Maintain a desired weight °Control your diabetes °Keep your blood pressure down ° °Dialysis ° °It will take several weeks to several months for your new dialysis access to be ready for use. Your surgeon will determine when it is OK to use it. Your nephrologist will continue to direct your dialysis. You can continue to use your Permcath until your new access is ready for use. ° °If you have any questions, please call the office at [phone]. ° °

## 2017-10-23 NOTE — Anesthesia Procedure Notes (Signed)
Procedure Name: LMA Insertion Date/Time: 10/23/2017 12:37 PM Performed by: Jenne Campus, CRNA Pre-anesthesia Checklist: Patient identified, Emergency Drugs available, Suction available and Patient being monitored Patient Re-evaluated:Patient Re-evaluated prior to induction Oxygen Delivery Method: Circle system utilized Preoxygenation: Pre-oxygenation with 100% oxygen Induction Type: IV induction Ventilation: Mask ventilation without difficulty LMA: LMA inserted LMA Size: 4.0 Number of attempts: 1 Placement Confirmation: positive ETCO2 and breath sounds checked- equal and bilateral Tube secured with: Tape Dental Injury: Teeth and Oropharynx as per pre-operative assessment

## 2017-10-23 NOTE — Op Note (Signed)
Procedure: Second stage left arm basilic vein transposition fistula.  Preoperative diagnosis: CKD capital for  Postoperative diagnosis: Same  Anesthesia: General  Assistant: Gerri Lins, PA-C  Operative details: After obtaining informed consent, patient was taken the operating.  The patient was placed in supine position operating table.  After induction of general anesthesia patient's entire left upper extremities prepped and draped in usual sterile fashion.  Next a pre-existing antecubital incision was reopened carried down through subtenons tissues down the level of the fistula.  This was dissected free all the way to the level of the arterial anastomosis.  The fistula was about 3 mm in diameter here.  I dissected it free through I had a additional 5 longitudinal incisions of the arm.  The vein was much larger diameter better quality as I proceeded up the arm.  The vein was passed and under sensory nerve just above the antecubital crease.  After I had fully mobilize it and ligated all side branches with silk ties I felt that I needed to move this to a superficial position to avoid injury to this nerve during cannulation.  She was given 5000 units of intravenous heparin.  The vein was transected about 4 cm from the antecubital area and the vein brought above and more superficial to the nerve.  An end-to-end anastomosis was then created from vein to vein using a running 7-0 Prolene suture.  Just prior to completion anastomosis it was forebled backbled and thoroughly flushed.  Anastomosis was secured clamps released and there was palpable thrill in fistula immediately.  Stasis was obtained with an additional repair stitch.  Several tacking 3-0 Vicryl sutures were then placed in the subcutaneous tissue to encourage the vein to a more superficial posterior to the sternum this below the skin.  All the skin incisions were then closed with a running 4-0 Vicryl subcuticular stitch.  Dermabond was applied all  the skin incisions.  The patient tolerated procedure well and there were no complications.  The instrument sponge needle count was correct in the case.  Patient had a 1+ palpable radial pulse the end of the case.  Ruta Hinds, MD [facility] Office: [phone] Pager: [phone]

## 2017-10-24 ENCOUNTER — Telehealth: Payer: Self-pay | Admitting: Vascular Surgery

## 2017-10-24 ENCOUNTER — Encounter (HOSPITAL_COMMUNITY): Payer: Self-pay | Admitting: Vascular Surgery

## 2017-10-24 NOTE — Telephone Encounter (Signed)
sch appt spk to pt 11/30/17 1pm p/o PA

## 2017-10-27 ENCOUNTER — Emergency Department (HOSPITAL_COMMUNITY): Payer: Medicare Other

## 2017-10-27 ENCOUNTER — Telehealth: Payer: Self-pay | Admitting: Behavioral Health

## 2017-10-27 ENCOUNTER — Telehealth: Payer: Self-pay | Admitting: *Deleted

## 2017-10-27 ENCOUNTER — Other Ambulatory Visit: Payer: Self-pay

## 2017-10-27 ENCOUNTER — Inpatient Hospital Stay (HOSPITAL_COMMUNITY): Payer: Medicare Other

## 2017-10-27 ENCOUNTER — Inpatient Hospital Stay (HOSPITAL_COMMUNITY)
Admission: EM | Admit: 2017-10-27 | Discharge: 2017-11-05 | DRG: 291 | Disposition: A | Discharge status: Home Health | Payer: Medicare Other | Attending: Family Medicine | Admitting: Family Medicine

## 2017-10-27 ENCOUNTER — Encounter (HOSPITAL_COMMUNITY): Payer: Self-pay

## 2017-10-27 DIAGNOSIS — R109 Unspecified abdominal pain: Secondary | ICD-10-CM | POA: Diagnosis not present

## 2017-10-27 DIAGNOSIS — H3530 Unspecified macular degeneration: Secondary | ICD-10-CM | POA: Diagnosis present

## 2017-10-27 DIAGNOSIS — R102 Pelvic and perineal pain: Secondary | ICD-10-CM | POA: Diagnosis not present

## 2017-10-27 DIAGNOSIS — Z8261 Family history of arthritis: Secondary | ICD-10-CM

## 2017-10-27 DIAGNOSIS — E11319 Type 2 diabetes mellitus with unspecified diabetic retinopathy without macular edema: Secondary | ICD-10-CM | POA: Diagnosis present

## 2017-10-27 DIAGNOSIS — J9601 Acute respiratory failure with hypoxia: Secondary | ICD-10-CM | POA: Diagnosis present

## 2017-10-27 DIAGNOSIS — M1A9XX0 Chronic gout, unspecified, without tophus (tophi): Secondary | ICD-10-CM | POA: Diagnosis present

## 2017-10-27 DIAGNOSIS — E1140 Type 2 diabetes mellitus with diabetic neuropathy, unspecified: Secondary | ICD-10-CM | POA: Diagnosis present

## 2017-10-27 DIAGNOSIS — R112 Nausea with vomiting, unspecified: Secondary | ICD-10-CM

## 2017-10-27 DIAGNOSIS — Z4901 Encounter for fitting and adjustment of extracorporeal dialysis catheter: Secondary | ICD-10-CM | POA: Diagnosis not present

## 2017-10-27 DIAGNOSIS — N83201 Unspecified ovarian cyst, right side: Secondary | ICD-10-CM | POA: Diagnosis present

## 2017-10-27 DIAGNOSIS — N179 Acute kidney failure, unspecified: Secondary | ICD-10-CM | POA: Diagnosis not present

## 2017-10-27 DIAGNOSIS — I482 Chronic atrial fibrillation, unspecified: Secondary | ICD-10-CM | POA: Diagnosis not present

## 2017-10-27 DIAGNOSIS — R0602 Shortness of breath: Secondary | ICD-10-CM | POA: Diagnosis present

## 2017-10-27 DIAGNOSIS — F419 Anxiety disorder, unspecified: Secondary | ICD-10-CM | POA: Diagnosis present

## 2017-10-27 DIAGNOSIS — F329 Major depressive disorder, single episode, unspecified: Secondary | ICD-10-CM | POA: Diagnosis present

## 2017-10-27 DIAGNOSIS — I5043 Acute on chronic combined systolic (congestive) and diastolic (congestive) heart failure: Secondary | ICD-10-CM | POA: Diagnosis present

## 2017-10-27 DIAGNOSIS — Z833 Family history of diabetes mellitus: Secondary | ICD-10-CM

## 2017-10-27 DIAGNOSIS — Z803 Family history of malignant neoplasm of breast: Secondary | ICD-10-CM | POA: Diagnosis not present

## 2017-10-27 DIAGNOSIS — Z7901 Long term (current) use of anticoagulants: Secondary | ICD-10-CM

## 2017-10-27 DIAGNOSIS — E785 Hyperlipidemia, unspecified: Secondary | ICD-10-CM | POA: Diagnosis not present

## 2017-10-27 DIAGNOSIS — D631 Anemia in chronic kidney disease: Secondary | ICD-10-CM | POA: Diagnosis present

## 2017-10-27 DIAGNOSIS — Z885 Allergy status to narcotic agent status: Secondary | ICD-10-CM

## 2017-10-27 DIAGNOSIS — I132 Hypertensive heart and chronic kidney disease with heart failure and with stage 5 chronic kidney disease, or end stage renal disease: Secondary | ICD-10-CM | POA: Diagnosis not present

## 2017-10-27 DIAGNOSIS — R52 Pain, unspecified: Secondary | ICD-10-CM | POA: Diagnosis not present

## 2017-10-27 DIAGNOSIS — Z794 Long term (current) use of insulin: Secondary | ICD-10-CM

## 2017-10-27 DIAGNOSIS — Z8349 Family history of other endocrine, nutritional and metabolic diseases: Secondary | ICD-10-CM

## 2017-10-27 DIAGNOSIS — K219 Gastro-esophageal reflux disease without esophagitis: Secondary | ICD-10-CM | POA: Diagnosis present

## 2017-10-27 DIAGNOSIS — E1151 Type 2 diabetes mellitus with diabetic peripheral angiopathy without gangrene: Secondary | ICD-10-CM | POA: Diagnosis not present

## 2017-10-27 DIAGNOSIS — A419 Sepsis, unspecified organism: Secondary | ICD-10-CM | POA: Diagnosis present

## 2017-10-27 DIAGNOSIS — J90 Pleural effusion, not elsewhere classified: Secondary | ICD-10-CM

## 2017-10-27 DIAGNOSIS — Z951 Presence of aortocoronary bypass graft: Secondary | ICD-10-CM

## 2017-10-27 DIAGNOSIS — R197 Diarrhea, unspecified: Secondary | ICD-10-CM | POA: Diagnosis not present

## 2017-10-27 DIAGNOSIS — R0902 Hypoxemia: Secondary | ICD-10-CM

## 2017-10-27 DIAGNOSIS — B9689 Other specified bacterial agents as the cause of diseases classified elsewhere: Secondary | ICD-10-CM | POA: Diagnosis present

## 2017-10-27 DIAGNOSIS — Z8673 Personal history of transient ischemic attack (TIA), and cerebral infarction without residual deficits: Secondary | ICD-10-CM

## 2017-10-27 DIAGNOSIS — R1084 Generalized abdominal pain: Secondary | ICD-10-CM | POA: Diagnosis present

## 2017-10-27 DIAGNOSIS — Z841 Family history of disorders of kidney and ureter: Secondary | ICD-10-CM

## 2017-10-27 DIAGNOSIS — E1121 Type 2 diabetes mellitus with diabetic nephropathy: Secondary | ICD-10-CM | POA: Diagnosis present

## 2017-10-27 DIAGNOSIS — R079 Chest pain, unspecified: Secondary | ICD-10-CM

## 2017-10-27 DIAGNOSIS — R971 Elevated cancer antigen 125 [CA 125]: Secondary | ICD-10-CM | POA: Diagnosis present

## 2017-10-27 DIAGNOSIS — R1011 Right upper quadrant pain: Secondary | ICD-10-CM

## 2017-10-27 DIAGNOSIS — R911 Solitary pulmonary nodule: Secondary | ICD-10-CM | POA: Diagnosis present

## 2017-10-27 DIAGNOSIS — R7881 Bacteremia: Secondary | ICD-10-CM

## 2017-10-27 DIAGNOSIS — E1122 Type 2 diabetes mellitus with diabetic chronic kidney disease: Secondary | ICD-10-CM | POA: Diagnosis present

## 2017-10-27 DIAGNOSIS — N83209 Unspecified ovarian cyst, unspecified side: Secondary | ICD-10-CM

## 2017-10-27 DIAGNOSIS — I2510 Atherosclerotic heart disease of native coronary artery without angina pectoris: Secondary | ICD-10-CM | POA: Diagnosis present

## 2017-10-27 DIAGNOSIS — R1110 Vomiting, unspecified: Secondary | ICD-10-CM

## 2017-10-27 DIAGNOSIS — Z856 Personal history of leukemia: Secondary | ICD-10-CM

## 2017-10-27 DIAGNOSIS — Z8249 Family history of ischemic heart disease and other diseases of the circulatory system: Secondary | ICD-10-CM

## 2017-10-27 DIAGNOSIS — I5030 Unspecified diastolic (congestive) heart failure: Secondary | ICD-10-CM | POA: Diagnosis not present

## 2017-10-27 DIAGNOSIS — Z992 Dependence on renal dialysis: Secondary | ICD-10-CM

## 2017-10-27 DIAGNOSIS — N390 Urinary tract infection, site not specified: Secondary | ICD-10-CM | POA: Diagnosis not present

## 2017-10-27 DIAGNOSIS — R609 Edema, unspecified: Secondary | ICD-10-CM | POA: Diagnosis not present

## 2017-10-27 DIAGNOSIS — Z880 Allergy status to penicillin: Secondary | ICD-10-CM

## 2017-10-27 DIAGNOSIS — R0789 Other chest pain: Secondary | ICD-10-CM | POA: Diagnosis not present

## 2017-10-27 DIAGNOSIS — I480 Paroxysmal atrial fibrillation: Secondary | ICD-10-CM | POA: Diagnosis present

## 2017-10-27 DIAGNOSIS — R918 Other nonspecific abnormal finding of lung field: Secondary | ICD-10-CM | POA: Diagnosis not present

## 2017-10-27 DIAGNOSIS — N186 End stage renal disease: Secondary | ICD-10-CM | POA: Diagnosis not present

## 2017-10-27 DIAGNOSIS — N185 Chronic kidney disease, stage 5: Secondary | ICD-10-CM | POA: Diagnosis not present

## 2017-10-27 DIAGNOSIS — I10 Essential (primary) hypertension: Secondary | ICD-10-CM | POA: Diagnosis not present

## 2017-10-27 LAB — URINALYSIS, ROUTINE W REFLEX MICROSCOPIC
Bilirubin Urine: NEGATIVE
Glucose, UA: NEGATIVE mg/dL
KETONES UR: 5 mg/dL — AB
Nitrite: NEGATIVE
PROTEIN: 100 mg/dL — AB
Specific Gravity, Urine: 1.011 (ref 1.005–1.030)
WBC, UA: >50 WBC/hpf — ABNORMAL HIGH (ref 0–5)
pH: 6 (ref 5.0–8.0)

## 2017-10-27 LAB — GLUCOSE, CAPILLARY
Glucose-Capillary: 109 mg/dL — ABNORMAL HIGH (ref 70–99)
Glucose-Capillary: 135 mg/dL — ABNORMAL HIGH (ref 70–99)

## 2017-10-27 LAB — I-STAT TROPONIN, ED
TROPONIN I, POC: 0.22 ng/mL — AB (ref 0.00–0.08)
Troponin i, poc: 0.25 ng/mL (ref 0.00–0.08)

## 2017-10-27 LAB — I-STAT CG4 LACTIC ACID, ED
Lactic Acid, Venous: 1.51 mmol/L (ref 0.5–1.9)
Lactic Acid, Venous: 1.12 mmol/L (ref 0.5–1.9)

## 2017-10-27 LAB — CBC
HEMATOCRIT: 32 % — AB (ref 36.0–46.0)
Hemoglobin: 10.2 g/dL — ABNORMAL LOW (ref 12.0–15.0)
MCH: 31.1 pg (ref 26.0–34.0)
MCHC: 31.9 g/dL (ref 30.0–36.0)
MCV: 97.6 fL (ref 78.0–100.0)
Platelets: 224 10*3/uL (ref 150–400)
RBC: 3.28 MIL/uL — ABNORMAL LOW (ref 3.87–5.11)
RDW: 18 % — ABNORMAL HIGH (ref 11.5–15.5)
WBC: 15 10*3/uL — ABNORMAL HIGH (ref 4.0–10.5)

## 2017-10-27 LAB — COMPREHENSIVE METABOLIC PANEL
ALBUMIN: 2.9 g/dL — AB (ref 3.5–5.0)
ALK PHOS: 67 U/L (ref 38–126)
ALT: 20 U/L (ref 0–44)
ANION GAP: 17 — AB (ref 5–15)
AST: 33 U/L (ref 15–41)
BILIRUBIN TOTAL: 1.1 mg/dL (ref 0.3–1.2)
BUN: 129 mg/dL — AB (ref 8–23)
CALCIUM: 9.1 mg/dL (ref 8.9–10.3)
CO2: 20 mmol/L — AB (ref 22–32)
Chloride: 101 mmol/L (ref 98–111)
Creatinine, Ser: 7.21 mg/dL — ABNORMAL HIGH (ref 0.44–1.00)
GFR calc Af Amer: 6 mL/min — ABNORMAL LOW (ref >60)
GFR calc non Af Amer: 5 mL/min — ABNORMAL LOW (ref >60)
GLUCOSE: 119 mg/dL — AB (ref 70–99)
Potassium: 4.5 mmol/L (ref 3.5–5.1)
SODIUM: 138 mmol/L (ref 135–145)
TOTAL PROTEIN: 5.2 g/dL — AB (ref 6.5–8.1)

## 2017-10-27 LAB — PROTIME-INR
INR: 1.78
PROTHROMBIN TIME: 20.6 s — AB (ref 11.4–15.2)

## 2017-10-27 LAB — BRAIN NATRIURETIC PEPTIDE: B NATRIURETIC PEPTIDE 5: 1450.3 pg/mL — AB (ref 0.0–100.0)

## 2017-10-27 LAB — I-STAT VENOUS BLOOD GAS, ED
Acid-base deficit: 5 mmol/L — ABNORMAL HIGH (ref 0.0–2.0)
Bicarbonate: 20.2 mmol/L (ref 20.0–28.0)
O2 SAT: 65 %
PCO2 VEN: 35.9 mmHg — AB (ref 44.0–60.0)
TCO2: 21 mmol/L — AB (ref 22–32)
pH, Ven: 7.359 (ref 7.250–7.430)
pO2, Ven: 35 mmHg (ref 32.0–45.0)

## 2017-10-27 LAB — LIPASE, BLOOD: Lipase: 21 U/L (ref 11–51)

## 2017-10-27 MED ORDER — FUROSEMIDE 10 MG/ML IJ SOLN
40.0000 mg | Freq: Once | INTRAMUSCULAR | Status: AC
  Administered 2017-10-27: 40 mg via INTRAVENOUS
  Filled 2017-10-27: qty 4

## 2017-10-27 MED ORDER — ONDANSETRON HCL 4 MG/2ML IJ SOLN
4.0000 mg | Freq: Once | INTRAMUSCULAR | Status: AC
  Administered 2017-10-27: 4 mg via INTRAVENOUS
  Filled 2017-10-27: qty 2

## 2017-10-27 MED ORDER — METOPROLOL TARTRATE 25 MG PO TABS
25.0000 mg | ORAL_TABLET | Freq: Two times a day (BID) | ORAL | Status: DC
  Administered 2017-10-27 – 2017-11-05 (×17): 25 mg via ORAL
  Filled 2017-10-27 (×18): qty 1

## 2017-10-27 MED ORDER — OXYCODONE HCL 5 MG PO TABS: 5.0000 mg | ORAL_TABLET | Freq: Four times a day (QID) | ORAL | Status: DC | PRN

## 2017-10-27 MED ORDER — FENTANYL CITRATE (PF) 100 MCG/2ML IJ SOLN
50.0000 ug | Freq: Once | INTRAMUSCULAR | Status: AC
  Administered 2017-10-27: 50 ug via INTRAVENOUS
  Filled 2017-10-27: qty 2

## 2017-10-27 MED ORDER — HYDROMORPHONE HCL 2 MG PO TABS: 2.0000 mg | ORAL_TABLET | Freq: Four times a day (QID) | ORAL | Status: DC | PRN

## 2017-10-27 MED ORDER — ACETAMINOPHEN 325 MG PO TABS
650.0000 mg | ORAL_TABLET | Freq: Four times a day (QID) | ORAL | Status: DC | PRN
  Administered 2017-10-27 – 2017-10-29 (×2): 650 mg via ORAL
  Filled 2017-10-27 (×2): qty 2

## 2017-10-27 MED ORDER — INSULIN GLARGINE 100 UNIT/ML ~~LOC~~ SOLN
16.0000 [IU] | Freq: Every day | SUBCUTANEOUS | Status: DC
  Administered 2017-10-27 – 2017-11-04 (×9): 16 [IU] via SUBCUTANEOUS
  Filled 2017-10-27 (×10): qty 0.16

## 2017-10-27 MED ORDER — HYDRALAZINE HCL 50 MG PO TABS
50.0000 mg | ORAL_TABLET | Freq: Three times a day (TID) | ORAL | Status: DC
  Administered 2017-10-27 – 2017-11-05 (×25): 50 mg via ORAL
  Filled 2017-10-27 (×26): qty 1

## 2017-10-27 MED ORDER — INSULIN ASPART 100 UNIT/ML ~~LOC~~ SOLN
0.0000 [IU] | Freq: Three times a day (TID) | SUBCUTANEOUS | Status: DC
  Administered 2017-10-28 – 2017-10-29 (×2): 1 [IU] via SUBCUTANEOUS
  Administered 2017-10-29 – 2017-10-30 (×4): 2 [IU] via SUBCUTANEOUS
  Administered 2017-10-30: 1 [IU] via SUBCUTANEOUS
  Administered 2017-10-31: 2 [IU] via SUBCUTANEOUS
  Administered 2017-10-31: 1 [IU] via SUBCUTANEOUS
  Administered 2017-11-01: 2 [IU] via SUBCUTANEOUS
  Administered 2017-11-01: 1 [IU] via SUBCUTANEOUS
  Administered 2017-11-02: 2 [IU] via SUBCUTANEOUS
  Administered 2017-11-02: 1 [IU] via SUBCUTANEOUS
  Administered 2017-11-04: 3 [IU] via SUBCUTANEOUS

## 2017-10-27 MED ORDER — ROSUVASTATIN CALCIUM 10 MG PO TABS
20.0000 mg | ORAL_TABLET | Freq: Every day | ORAL | Status: DC
  Administered 2017-10-27 – 2017-10-30 (×4): 20 mg via ORAL
  Filled 2017-10-27 (×4): qty 2

## 2017-10-27 MED ORDER — ACETAMINOPHEN 650 MG RE SUPP: 650.0000 mg | Freq: Four times a day (QID) | RECTAL | Status: DC | PRN

## 2017-10-27 MED ORDER — WARFARIN - PHARMACIST DOSING INPATIENT
Freq: Every day | Status: DC
  Administered 2017-10-27 – 2017-10-29 (×3)

## 2017-10-27 MED ORDER — PAROXETINE HCL 20 MG PO TABS
40.0000 mg | ORAL_TABLET | ORAL | Status: DC
  Administered 2017-10-28 – 2017-11-05 (×8): 40 mg via ORAL
  Filled 2017-10-27 (×2): qty 4
  Filled 2017-10-27: qty 2
  Filled 2017-10-27 (×5): qty 4
  Filled 2017-10-27 (×3): qty 2
  Filled 2017-10-27: qty 4

## 2017-10-27 MED ORDER — WARFARIN SODIUM 5 MG PO TABS
5.0000 mg | ORAL_TABLET | Freq: Once | ORAL | Status: AC
  Administered 2017-10-27: 5 mg via ORAL
  Filled 2017-10-27: qty 1

## 2017-10-27 NOTE — ED Notes (Signed)
Patient transported to CT 

## 2017-10-27 NOTE — ED Notes (Signed)
Dinner tray ordered.

## 2017-10-27 NOTE — ED Notes (Signed)
ED Provider at bedside. 

## 2017-10-27 NOTE — ED Notes (Signed)
Pt reports diarrhea that started this morning.

## 2017-10-27 NOTE — Telephone Encounter (Signed)
Pt states that she has been having abdominal pain and diarrhea since 7am.  She sounds very exhausted on the phone. She wants to see a provider.  We have no openings today and given pts age asked her to go to urgent care.  She is agreeable. [place], Salome Spotted, CMA

## 2017-10-27 NOTE — ED Notes (Signed)
Pt placed on 2L [place] due to saturations dropping to 89%, pt now 98% on 2L

## 2017-10-27 NOTE — ED Provider Notes (Signed)
[facility] EMERGENCY DEPARTMENT Provider Note   CSN: [id number] Arrival date & time: 10/27/17  1110     History   Chief Complaint Chief Complaint  Patient presents with  . Abdominal Pain    HPI AMAZIN Christine Santos is a 72 y.o. female.  The history is provided by the patient, medical records, the spouse and a relative. No language interpreter was used.  Abdominal Pain   This is a new problem. The current episode started yesterday. The problem occurs constantly. The problem has been gradually worsening. The pain is associated with an unknown factor. The pain is located in the RUQ. The quality of the pain is aching and sharp. The pain is at a severity of 10/10. The pain is severe. Associated symptoms include diarrhea, nausea and vomiting. Pertinent negatives include fever, constipation, dysuria, frequency and headaches. The symptoms are aggravated by palpation. Nothing relieves the symptoms.    Past Medical History:  Diagnosis Date  . Anemia 08/29/2011  . Anxiety   . Arthritis    "legs" (01/22/2013)  . CAD (coronary artery disease)    s/p CABG in 1999  . Cataract   . Chronic kidney disease    Sees Dr [name]  . CLL (chronic lymphoblastic leukemia)    Kidney  . Diastolic heart failure ([place])   . GERD (gastroesophageal reflux disease)   . Gout   . H/O hiatal hernia   . Heart murmur   . Hyperlipidemia   . Hypertension   . IDDM (insulin dependent diabetes mellitus) ([place])    Type II  . PAD (peripheral artery disease) ([place])   . Paroxysmal atrial fibrillation (HCC)    a. identified on ILR as part of StrokeAF study  . Peripheral vascular disease ([place])   . Pneumonia 11/2016  . 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 Still has balance issues     Patient Active Problem List   Diagnosis Date Noted  . Long term (current) use of anticoagulants 10/02/2017  . Enteritis due to Clostridium difficile 07/02/2017  . Left hip pain  02/16/2017  . History of stroke 01/20/2017  . Physical deconditioning 12/30/2016  . Transaminitis 12/23/2016  . Paroxysmal atrial fibrillation ([place]) 10/22/2015  . Sinusitis, chronic 08/05/2015  . 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 Hyperkalemia 06/21/2013  . Diabetic nephropathy ([place]) 05/23/2013  . Diabetic neuropathy ([place]) 11/30/2012  . Macular degeneration 07/04/2012  . Diabetic retinopathy ([place]) 03/20/2012  . Osteopenia 09/22/2011  . Anemia in chronic kidney disease 08/29/2011  . Gout 04/13/2011  . Stress incontinence, female 11/11/2010  . Anxiety 08/18/2010  . Chronic systolic heart failure ([place]) 02/18/2009  . SINUS BRADYCARDIA 10/29/2008  . Peripheral vascular disease ([place]) 06/18/2008  . CKD (chronic kidney disease) stage 5, GFR less than 15 ml/min (HCC) 07/13/2007  . Chronic lymphocytic leukemia ([place]) 10/13/2006  . 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 HYPERCHOLESTEROLEMIA 03/23/2006  . Essential hypertension 03/23/2006  . Coronary atherosclerosis 03/23/2006  . REFLUX ESOPHAGITIS 03/23/2006  . IRRITABLE BOWEL SYNDROME 03/23/2006  . CYSTOCELE/RECTOCELE/PROLAPSE,UNSPEC. 03/23/2006  . Osteoarthritis, multiple sites 03/23/2006    Past Surgical History:  Procedure Laterality Date  . ANGIOPLASTY / STENTING FEMORAL Right 09/2010   SFA/notes 11/25/2010 (01/22/2013)  . ANGIOPLASTY / STENTING FEMORAL Left 11/2010   SFA/notes 11/25/2010 (01/22/2013)  . ANGIOPLASTY / STENTING ILIAC     [name] 11/25/2010 (01/22/2013)  . BASCILIC VEIN TRANSPOSITION Left 08/23/2017  Procedure: FIRST STAGE BASILIC VEIN TRANSPOSITION LEFT UPPER EXTREMITY;  Surgeon: [name], MD;  Location: [facility];  Service: Vascular;  Laterality: Left;  . BASCILIC VEIN TRANSPOSITION Left 10/23/2017   Procedure: SECOND STAGE BASILIC VEIN TRANSPOSITION LEFT ARM;  Surgeon: [name], MD;  Location: [facility];  Service: Vascular;   Laterality: Left;  . CATARACT EXTRACTION W/ INTRAOCULAR LENS  IMPLANT, BILATERAL Bilateral ?2011  . CHOLECYSTECTOMY  1993  . CORONARY ARTERY BYPASS GRAFT  10/1994   "CABG X3"  . EP IMPLANTABLE DEVICE N/A 09/11/2015   Procedure: Loop Recorder Insertion;  Surgeon: Thompson Grayer, MD;  Location: [facility];  Service: Cardiovascular;  Laterality: N/A;  . HEEL SPUR EXCISION Bilateral 1970's  . LOWER EXTREMITY ANGIOGRAM N/A 01/22/2013   Procedure: LOWER EXTREMITY ANGIOGRAM;  Surgeon: [name], MD;  Location[facility] CATH [facility];  Service: Cardiovascular;  Laterality: N/A;  . SHOULDER OPEN ROTATOR CUFF REPAIR Bilateral 1990's   "2 times on 1 side; once on the other"  . TRANSLUMINAL ATHERECTOMY FEMORAL ARTERY Right 01/22/2013   & balloon  . TUBAL LIGATION  1980's  . VIDEO ASSISTED THORACOSCOPY (VATS)/EMPYEMA Left 11/29/2016   Procedure: VIDEO ASSISTED THORACOSCOPY (VATS)/EMPYEMA;  Surgeon: [name], MD;  Location: [facility];  Service: Thoracic;  Laterality: Left;  VIDEO ASSISTED THORACOSCOPY (VATS)/EMPYEMA     OB History   None      Home Medications    Prior to Admission medications   Medication Sig Start Date End Date Taking? Authorizing Provider  ACCU-CHEK AVIVA PLUS test strip TEST THREE TIMES A DAY 04/26/17   [name], MD  allopurinol (ZYLOPRIM) 100 MG tablet TAKE 1 TABLET(100 MG) BY MOUTH DAILY 10/17/17   Hensel, Jamal Collin, MD  Ascorbic Acid (VITAMIN C) 100 MG tablet Take 100 mg by mouth daily.    [provider]  carboxymethylcellulose (REFRESH PLUS) 0.5 % SOLN Place 1 drop into both eyes 3 (three) times daily as needed (dry eyes).     [provider]  ferrous sulfate 325 (65 FE) MG tablet Take 1 tablet (325 mg total) by mouth daily with breakfast. 05/23/13   [name], MD  GNP GARLIC EXTRACT PO Take 1 tablet by mouth daily.     [provider]  HUMALOG KWIKPEN 100 UNIT/ML KiwkPen Inject 5 Units as directed 3 (three) times daily  after meals.  11/03/16   [provider]  hydrALAZINE (APRESOLINE) 50 MG tablet TAKE 1 TABLET BY MOUTH THREE TIMES DAILY 10/18/17   [name], MD  LANTUS SOLOSTAR 100 UNIT/ML Solostar Pen ADMINISTER 35 UNITS UNDER THE SKIN DAILY Patient taking differently: Inject 16 Units into the skin at bedtime.  10/02/17   [name], MD  Melatonin 10 MG CAPS Take 10 mg by mouth at bedtime as needed (sleep).     [provider]  metoprolol tartrate (LOPRESSOR) 25 MG tablet TAKE 1 TABLET BY MOUTH TWICE DAILY Patient taking differently: Take 25 mg by mouth 2 (two) times daily.  12/07/16   [name], MD  Multiple Vitamins-Minerals (EYE VITAMINS PO) Take 1 capsule by mouth daily. Taking one daily from her opthamologist (Dr. Herbert Deaner)     [provider]  Multiple Vitamins-Minerals (MULTIVITAMINS THER. W/MINERALS) TABS Take 1 tablet by mouth daily.      [provider]  omega-3 acid ethyl esters (LOVAZA) 1 g capsule Take 2 g by mouth 2 (two) times daily.     [provider]  ondansetron (ZOFRAN) 4 MG tablet Take 1 tablet (4 mg total) by mouth every 8 (eight) hours as needed for nausea or vomiting. 10/11/17   Carlyle Basques, MD  pantoprazole (PROTONIX) 40 MG tablet TAKE 1 TABLET BY MOUTH DAILY Patient taking differently: Take 40 mg by mouth at bedtime.  05/29/17   [name], MD  PARoxetine (PAXIL) 40 MG tablet Take 1 tablet (40 mg total) by mouth every morning. 10/18/17   [name], MD  rosuvastatin (CRESTOR) 20 MG tablet Take 1 tablet (20 mg total) by mouth daily. 10/18/17   [name], MD  torsemide (DEMADEX) 100 MG tablet Take 1 tablet (100 mg total) by mouth daily. 02/16/17   [name], MD  traMADol (ULTRAM) 50 MG tablet Take 1 tablet (50 mg total) by mouth every 6 (six) hours as needed (for pain.). 10/23/17   Ulyses Amor, PA-C  UNKNOWN TO PATIENT LOVENOX, pt unsure of dose    [provider]  warfarin  (COUMADIN) 5 MG tablet Take 1 tablet (5 mg total) by mouth daily. Patient taking differently: Take 5 mg by mouth See admin instructions. Take 0.5 tablet (2.5 mg) by mouth on Sundays, Tuesdays, Wednesdays, Thursdays, Saturdays. Take 1 tablet (5 mg) by mouth on Mondays & Fridays. 09/19/17   [name], MD    Family History Family History  Problem Relation Age of Onset  . Heart disease Mother   . Heart attack Mother   . Hypertension Mother   . Kidney disease Mother   . Pneumonia Father   . Heart attack Father   . Hyperlipidemia Father   . Diabetes Sister   . Breast cancer Sister   . Arthritis Brother   . Diabetes Brother   . Heart attack Sister   . Obesity Sister   . Hypertension Sister   . Heart attack Sister   . Cancer Sister        breast  . Hyperlipidemia Sister   . Hypertension Sister   . Hyperlipidemia Sister   . Breast cancer Maternal Grandmother     Social History Social History   Tobacco Use  . Smoking status: Never Smoker  . Smokeless tobacco: Never Used  Substance Use Topics  . Alcohol use: No  . Drug use: No     Allergies   Other; Amoxicillin; Codeine phosphate; and Penicillins   Review of Systems Review of Systems  Constitutional: Positive for chills, diaphoresis and fatigue. Negative for fever.  HENT: Negative for congestion.   Respiratory: Positive for cough, chest tightness and shortness of breath. Negative for wheezing.   Cardiovascular: Positive for chest pain and leg swelling. Negative for palpitations.  Gastrointestinal: Positive for abdominal pain, diarrhea, nausea and vomiting. Negative for constipation.  Genitourinary: Positive for flank pain (R side). Negative for dysuria and frequency.  Musculoskeletal: Negative for back pain, neck pain and neck stiffness.  Skin: Positive for wound.  Neurological: Positive for light-headedness. Negative for dizziness, weakness and headaches.  Psychiatric/Behavioral: Negative for agitation and  confusion.  All other systems reviewed and are negative.    Physical Exam Updated Vital Signs Ht 5\' 5"  (1.651 m)   Wt 73.3 kg   SpO2 95%   BMI 26.89 kg/m   Physical Exam  Constitutional: She is oriented to person, place, and time. She appears well-developed and well-nourished.  Non-toxic appearance. She does not appear ill. No distress.  HENT:  Head: Normocephalic and atraumatic.  Mouth/Throat: Oropharynx is clear and moist.  No oropharyngeal exudate.  Eyes: Pupils are equal, round, and reactive to light. Conjunctivae and EOM are normal.  Neck: Normal range of motion. Neck supple.  Cardiovascular: Regular rhythm. Tachycardia present.  Murmur heard. Pulmonary/Chest: Tachypnea noted. She is in respiratory distress. She has no wheezes. She has rales. She exhibits tenderness.  Abdominal: Soft. Normal appearance. She exhibits no distension. There is tenderness in the right upper quadrant. There is no rigidity, no rebound and no CVA tenderness.    Musculoskeletal: She exhibits tenderness. She exhibits no edema.       Left elbow: Tenderness found.       Arms: Patient had palpable radial pulses bilaterally.  Bruising seen on left upper arm with her surgical site but no crepitance fluctuance or drainage.  Normal sensation and strength in hands.  Neurological: She is alert and oriented to person, place, and time. No sensory deficit. She exhibits normal muscle tone.  Skin: Skin is warm and dry. Capillary refill takes less than 2 seconds. No rash noted. She is not diaphoretic. No erythema.  Psychiatric: She has a normal mood and affect.  Nursing note and vitals reviewed.    ED Treatments / Results  Labs (all labs ordered are listed, but only abnormal results are displayed) Labs Reviewed  COMPREHENSIVE METABOLIC PANEL - Abnormal; Notable for the following components:      Result Value   CO2 20 (*)    Glucose, Bld 119 (*)    BUN 129 (*)    Creatinine, Ser 7.21 (*)    Total Protein 5.2  (*)    Albumin 2.9 (*)    GFR calc non Af Amer 5 (*)    GFR calc Af Amer 6 (*)    Anion gap 17 (*)    All other components within normal limits  CBC - Abnormal; Notable for the following components:   WBC 15.0 (*)    RBC 3.28 (*)    Hemoglobin 10.2 (*)    HCT 32.0 (*)    RDW 18.0 (*)    All other components within normal limits  PROTIME-INR - Abnormal; Notable for the following components:   Prothrombin Time 20.6 (*)    All other components within normal limits  BRAIN NATRIURETIC PEPTIDE - Abnormal; Notable for the following components:   B Natriuretic Peptide 1,450.3 (*)    All other components within normal limits  I-STAT TROPONIN, ED - Abnormal; Notable for the following components:   Troponin i, poc 0.25 (*)    All other components within normal limits  I-STAT TROPONIN, ED - Abnormal; Notable for the following components:   Troponin i, poc 0.22 (*)    All other components within normal limits  I-STAT VENOUS BLOOD GAS, ED - Abnormal; Notable for the following components:   pCO2, Ven 35.9 (*)    TCO2 21 (*)    Acid-base deficit 5.0 (*)    All other components within normal limits  CULTURE, BLOOD (ROUTINE X 2)  URINE CULTURE  CULTURE, BLOOD (ROUTINE X 2)  LIPASE, BLOOD  URINALYSIS, ROUTINE W REFLEX MICROSCOPIC  BLOOD GAS, VENOUS  I-STAT CG4 LACTIC ACID, ED  I-STAT CG4 LACTIC ACID, ED    EKG EKG Interpretation  Date/Time:  Friday October 27 2017 11:54:29 EDT Ventricular Rate:  111 PR Interval:    QRS Duration: 115 QT Interval:  330 QTC Calculation: 443 R Axis:   -90 Text Interpretation:  Atrial flutter Right bundle branch block Anterior infarct, old Abnormal T, consider ischemia, lateral leads  When compared to prior, faster rate.  No STEMI Confirmed by Antony Blackbird [phone]) on 10/27/2017 12:29:52 PM   Radiology Ct Abdomen Pelvis Wo Contrast  Result Date: 10/27/2017 CLINICAL DATA:  Right lower quadrant abdominal pain. Diarrhea. Vomiting. Dialysis patient. EXAM:  CT ABDOMEN AND PELVIS WITHOUT CONTRAST TECHNIQUE: Multidetector CT imaging of the abdomen and pelvis was performed following the standard protocol without IV contrast. COMPARISON:  02/28/2017 CT pelvis FINDINGS: Lower chest: Large right effusion layering dependently with dependent pulmonary atelectasis. Tiny left effusion with mild left base atelectasis. Cardiomegaly. Hepatobiliary: Liver parenchyma appears normal. Previous cholecystectomy. Arterial calcification. Pancreas: Normal Spleen: Normal Adrenals/Urinary Tract: Adrenal glands are normal. Renal parenchyma shows mild generalized volume loss. There is a 1.5 cm hyperdense cyst of the lateral left kidney. There is mild fullness of the right renal collecting system and ureter. I do not see evidence of stone disease. Bladder appears normal. Stomach/Bowel: No proximal bowel abnormality is seen. There is sigmoid diverticulosis. Vascular/Lymphatic: Aortic atherosclerosis but no aneurysm. Widespread vascular calcification. Reproductive: Uterus appears normal. Chronic right-sided ovarian cysts is evident as was seen on the previous CT. This is surrounded by fluid in the right lower quadrant and free fluid in the pelvis. I cannot rule out the possibility of inflammatory disease/abscess in addition. Other: Small amount of free fluid around the liver. Musculoskeletal: Chronic curvature in degenerative change of the spine. There is a ventral hernia just to the left of the umbilicus containing only fat. No herniated bowel. IMPRESSION: Large right pleural effusion with dependent pulmonary atelectasis. Cardiomegaly. Free intraperitoneal fluid. This is most prominent in the pelvis. There is a right ovarian cyst as was seen on the previous study, surrounded by the fluid. I cannot rule out the possibility of inflammatory disease in the pelvis. I cannot identify any acute bowel pathology. Widespread and extensive vascular calcification. Fullness of the right renal collecting  system and proximal ureter, possibly secondary to the right-sided pelvic process seen above. Ventral hernia containing only fat. Because of the indeterminate findings, one might consider a pelvic ultrasound and/or abdominal CT with oral and intravenous contrast if possible. Electronically Signed   By: Nelson Chimes M.D.   On: 10/27/2017 13:22   Dg Chest 2 View  Result Date: 10/27/2017 CLINICAL DATA:  Hypoxia.  Tachycardia. EXAM: CHEST - 2 VIEW COMPARISON:  04/10/2017. FINDINGS: Prior CABG. Cardiomegaly with diffuse bilateral from interstitial prominence and bilateral pleural effusions. Findings consistent with CHF. Gastric distention noted. IMPRESSION: 1. Prior CABG. Cardiomegaly with diffuse bilateral from interstitial prominence and bilateral pleural effusions consistent CHF. 2.  Gastric distention. Electronically Signed   By: Marcello Moores  Register   On: 10/27/2017 12:32    Procedures Procedures (including critical care time)  CRITICAL CARE Performed by: Gwenyth Allegra Tegeler Total critical care time: 35 minutes Critical care time was exclusive of separately billable procedures and treating other patients. Critical care was necessary to treat or prevent imminent or life-threatening deterioration. Critical care was time spent personally by me on the following activities: development of treatment plan with patient and/or surrogate as well as nursing, discussions with consultants, evaluation of patient's response to treatment, examination of patient, obtaining history from patient or surrogate, ordering and performing treatments and interventions, ordering and review of laboratory studies, ordering and review of radiographic studies, pulse oximetry and re-evaluation of patient's condition.   Medications Ordered in ED Medications  furosemide (LASIX) injection 40 mg (has no administration in time range)  ondansetron (ZOFRAN) injection 4 mg (4 mg Intravenous Given 10/27/17  1207)  fentaNYL (SUBLIMAZE)  injection 50 mcg (50 mcg Intravenous Given 10/27/17 1208)  fentaNYL (SUBLIMAZE) injection 50 mcg (50 mcg Intravenous Given 10/27/17 1435)     Initial Impression / Assessment and Plan / ED Course  I have reviewed the triage vital signs and the nursing notes.  Pertinent labs & imaging results that were available during my care of the patient were reviewed by me and considered in my medical decision making (see chart for details).     Christine Santos is a 72 y.o. female with a past medical history significant for atrial fibrillation on Coumadin therapy, hypertension, hyperlipidemia, CHF, CAD, PVD/PAD, GERD, diabetes, prior CLL, stroke, and CKD status post recent left arm fistula surgery who presents with severe right-sided abdominal pain radiating to right lower chest, shortness of breath, hypoxia, nausea with dry heaving, diarrhea, chills, and fatigue.  Patient reports that on Monday she had her outpatient left arm fistula surgery and was doing well until last night when she started having right abdominal cramping.  She reports that progressed overnight and to severe, 12 out of 10 pain today.  She reports it is sharp and aching.  It radiates up towards her right lower chest.  She reports it hurts to take a deep breath.  Patient had oxygen saturations in the upper 80s on arrival and was tachycardic into the 120s.  Patient has chills and feels warm to touch, rectal temperature will be obtained.  Patient had active bowel sounds and reported she had diarrhea earlier.  She reports that she recently completed antibiotics for urinary tract infection and reports her urine has still had some foul smell to it.  She reports no other trauma.  She denies any other chest pain or palpitations.  She is dry heaving on my initial evaluation.  Next  On initial exam, patient is tachycardic in the 120s.  She is hypoxic and placed on an nasal cannula oxygen saturation with significant improvement into the upper 90s.  Her breath  sounds had some crackles in the bases and her legs had edema bilaterally.  Patient's right hemiabdomen was extremely tender to touch as well as the right lower chest.  Patient had active bowel sounds.  No CVA tenderness or back tenderness.  Clinical I am concerned about multiple etiologies of symptoms.  Patient has a history of prior cholecystectomy so doubt biliary pathology.  She is unsure of any other surgery she has had in her abdomen but she does have evidence of a large ex lap scar.  Persistent UTI or pyelonephritis considered.  Also considering a postoperative pneumonia or pain related to diarrhea.  She has had recent antibiotics use but is only had diarrhea for 1 day, C. difficile less likely.   Patient will be given pain medicine and nausea medicine.  We will hold on fluids as clinically I am concerned about fluid overload with her hypoxia, crackles on lungs, edema in legs.  We will attempt to try and help heart rate with pain control.  Patient will have CT imaging without contrast to further evaluate the abdomen, get urinalysis and lab testing.  Chest x-ray be obtained.    Anticipate reassessment and likely admission after work-up is completed.  2:48 PM Diagnostic work-up concerning for several things.  Patient is now on 4 L with sats in the 90% range.  Clinically I am concerned about fluid overload.  I have not given her fluids because of this.  Patient's BNP elevated at 1400.  Creatinine has  elevated to over 7.  Chest x-ray and CT scan show edema and pleural effusions.  CT scan also showed large right pleural effusion with atelectasis.  They did not see pneumonia.  CT scan also showed some intraperitoneal fluid as well as fluid in the pelvis.  Patient says that she has known ovarian cysts and they cannot rule out a positive inflammatory disease in the pelvis but they did not see any bowel pathology.  Pelvic ultrasound was recommended to be considered if patient can tolerate.  Patient's white  blood cell count was elevated at 15 and hemoglobin slightly low at 10.2.  Troponin improved but elevated at 0.25.  Lactic acid normal.  INR subtherapeutic at 1.7.  Clinically lower suspicion for pneumonia and suspect that the patient's pain is due to the large right pleural effusion.  Patient is showing edema all of her body including her arms and legs.  I suspect is the cause of the fluid in the abdomen.  Patient did not have lower abdominal tenderness, doubt pelvic pathology at this time however patient had worsened pain would consider pelvic ultrasound.  Patient's pain improved with fentanyl and nausea improved with Zofran.  Due to the patient's fluid balance issues were she has persistent tachycardia and AKI but cannot receive fluids due to the fluid overload, patient will need admission.  Patient does not take oxygen at home and is now on 4 L nasal cannula.  Pulmonary embolism was considered given her recent surgery however given the kidney function she is not a candidate for PE study.  Admitting team may consider VQ scan however the known pleural effusion would likely complicate these images.  Family medicine team will be called for admission for further management.   Final Clinical Impressions(s) / ED Diagnoses   Final diagnoses:  Ovarian cyst rupture  Hypoxia  Pleural effusion  Shortness of breath  Chest pain, unspecified type  Right upper quadrant abdominal pain  Intractable vomiting with nausea, unspecified vomiting type  Diarrhea, unspecified type  Edema, unspecified type    Clinical Impression: 1. Hypoxia   2. Ovarian cyst rupture   3. Pleural effusion   4. Shortness of breath   5. Chest pain, unspecified type   6. Right upper quadrant abdominal pain   7. Intractable vomiting with nausea, unspecified vomiting type   8. Diarrhea, unspecified type   9. Edema, unspecified type     Disposition: Admit  This note was prepared with assistance of Dragon voice recognition  software. Occasional wrong-word or sound-a-like substitutions may have occurred due to the inherent limitations of voice recognition software.     Tegeler, Gwenyth Allegra, MD 10/27/17 [phone]

## 2017-10-27 NOTE — ED Triage Notes (Signed)
Pt from home with ems for severe sharp right sided abd that started around 7am this morning. Dry heaves with no vomiting. Pt had outpatient surgery on Monday om this week for fistula placement in L arm. Pt a.o.  Bp 150 palpated HR86 rr20 95% on room air CBG 144

## 2017-10-27 NOTE — H&P (Addendum)
[facility] Admission History and Physical Service Pager: [phone]  Patient name: Christine Santos Medical record number: [id number] Date of birth: [birth year] Age: 72 y.o. Gender: female  Primary Care Provider: [name], MD Consultants: nephrology Code Status: full  Chief Complaint: abdomen pain  Assessment and Plan: Christine Santos is a 72 y.o. female presenting with acute onset abdominal pain.  Patient also noted to have large right pleural effusion, and free intraperitoneal fluid.  Abdominal pain Patient with sudden onset sharp abdominal pain while straining during defecation.  CT abdomen pelvis showing large amount of fluid surrounding right ovarian cyst.  Given proximity to fluid collection,  pathology from ovarian cyst is high on differential.  Patient with elevated white count which may be from inflammation due to this fluid collection.  Patient does have remote history of C. difficile but has not had significantly loose stools for significant period of time.  Very unlikely to be appendiceal pathology given no visualization on CT scan.  Status post cholecystectomy is unlikely to be biliary in etiology.  Will obtain transvaginal and pelvic ultrasound to better characterize ovarian cyst and fluid collection.  Patient requesting food which is very reassuring.  If etiology preventative for ovarian cyst, observation will be mainstay of treatment. For pain relief will give p.o. Dilaudid due to ESRD. - Admit to inpatient, Dr. Erin Hearing, stepdown unit - Vital signs per floor routine - Follow-up transvaginal and pelvic ultrasound - A.m. CBC and CMP MRI - Dilaudid 2 mg every 6 hours PRN for severe pain - Warfarin for DVT prophylaxis -Zofran PRN for nausea  Shortness of breath Unclear etiology.  Patient with large right pleural effusion and significantly elevated BNP.  Could possibly be mixed etiology of CHF exacerbation and pleural effusion.  Minimal fluid in  lungs outside of pleural effusion.  Increased bilateral lower extremity edema.  Will work-up pleural effusion and CHF as below.  Wean O2 as possible -CHF work-up as below - Pleural effusion work-up as below  Pleural effusion Patient appears to have large right-sided pleural effusion, inadequately evaluated with CT abdomen and pelvis.  Did have community-acquired pneumonia back in 11/2016 which required VATS and chest tube to resolve loculation.  Given acute shortness of breath and CT scan findings it is concerning that this may have reappeared.  We will need to get CT chest without contrast to better evaluate.  Depending on results of CT chest will likely ask PCCM to evaluate for diagnostic thoracentesis.  Afebrile, normal lactic acid.  Despite leukocytosis no indication for antibiotics at this time. -CT chest without contrast -Likely consultation with PCCM after CT chest results -Monitor O2 requirement  AKI on CKD Creatinine 7.2 up from 4.65 on 10/18/2017.  Unclear if this acute kidney injury is secondary to bout of dye used during procedure, dehydration, or some other etiology.  Per chart review and patient report does not appear to be taking any nephrotoxic agents.  Consulted nephrology who agreed to come see patient, appreciate the Recs. -Follow-up nephrology recommendations -Renal function panel in a.m.  ? CHF exacerbation Patient with echo from 2017 with EF 55 to 60% and grade 1 diastolic dysfunction.  Patient with 2+ pitting edema bilateral lower extremities.  Unclear if BNP of over 1400 represents worsening systolic heart failure or is from other etiology.  Bilateral lower extremity swelling is increased per family report.  We will get repeat transthoracic echo to better evaluate.  Received Lasix 40 mg IV x1 in ED with  increased urine output. -Transthoracic echo -Monitor bilateral lower extremity edema -Daily weight -Monitor I/Os -Consider repeat Lasix dose 10/5  Atrial  fibrillation Patient takes metoprolol 25 mg twice daily for A. fib.  Chads vas 2 score 4, has bled score 3.  Formally took Eliquis but due to worsening renal function was switched to warfarin.  INR ranging from 2.2-1.2 within the last month.  Given her A. fib her target should be 2-3. -Warfarin dosing per pharmacy, goal 2-3 -Continue metoprolol 25 mg twice daily  Hypertension Initially hypotensive on admission with BP 167/92.  Well-controlled at most recent check at 129/68. -Continue hydralazine 50 mg 3 times daily -Continue metoprolol 25 mg twice daily  Type 2 diabetes Last A1c 6.2 on 12/13/2017.  We will recheck at this admission.  Lantus 16 units plus sliding scale at home. -Follow-up A1c -Lantus 16 units -Sensitive sliding scale  Hyperlipidemia Cholesterol 169 on 09/04/2015, LDL 99, HDL 32.  On rosuvastatin 20 mg. -Continue rosuvastatin 20 mg  Anxiety/depression On Paxil 40 mg.  PMH is significant for systolic heart failure, CKD, type 2 diabetes, diabetic neuropathy, history of C. difficile, HLD, IBS, atrial fibrillation  FEN/GI: Renal diet Prophylaxis: Warfarin  Disposition: Pending clinical course  History of Present Illness:  Christine Santos is a 72 y.o. female presenting with sudden onset abdominal pain starting in the a.m. of 10/27/2017.  Per patient reports she had very mild dull pain for last 2 days.  She went to the bathroom in the a.m. of 10/27/2017 and while straining developed sudden onset abdominal pain.  She describes the pain as a sharp stabbing sensation in her upper abdomen/lower right thorax.  For this reason the patient came to the emergency department.  Patient has a known right ovarian cyst.  While patient was in the emergency department, she developed acute onset shortness of breath.  Patient is on room air at baseline but required 4 L of oxygen.  This acutely happened while she was laying down due to the abdominal pain.  When she sat up shortness of breath did  improve.  Work-up in the emergency department consisted of a VBG, CMP, BNP, troponin, lactic acid, CBC, PT, INR, blood culture x2, CT abdomen pelvis, 2 view chest x-ray, twelve-lead EKG.  Work-up significant for BUN 129, creatinine 7.21, BNP 1450, troponin 0 0.25 and 0.22, WBC 15, hemoglobin 10.2, PT 20.6.  CT abdomen and pelvis significant for free intraperitoneal fluid most prominent in the right pelvis.  There is a known right ovarian cyst surrounded by fluid.  There is also a large right pleural effusion with dependent pulmonary atelectasis.  Treatment in the ED consisted of the aforementioned oxygen.  Fentanyl 50 mcg.  Per patient report the fentanyl made her abdominal pain improved.  Of note patient recently had a second stage basilic vein transposition and left upper extremity on 10/23/2017.  Wound is been well healing prior to this.  This was placed due to worsening kidney function, and for anticipated dialysis in the near future.  Review Of Systems: Per HPI with the following additions:   Review of Systems  Constitutional: Negative for chills and fever.  Respiratory: Negative for cough, hemoptysis and sputum production.   Cardiovascular: Negative for chest pain, palpitations and orthopnea.  Gastrointestinal: Positive for abdominal pain. Negative for constipation, diarrhea, nausea and vomiting.  Musculoskeletal: Negative for myalgias.  Neurological: Negative for dizziness and headaches.    Patient Active Problem List   Diagnosis Date Noted  . Abdominal pain 10/27/2017  .  Ovarian cyst rupture   . Long term (current) use of anticoagulants 10/02/2017  . Enteritis due to Clostridium difficile 07/02/2017  . Left hip pain 02/16/2017  . History of stroke 01/20/2017  . Physical deconditioning 12/30/2016  . Transaminitis 12/23/2016  . Pleural effusion   . Hypoxia 11/25/2016  . Paroxysmal atrial fibrillation ([place]) 10/22/2015  . Sinusitis, chronic 08/05/2015  . Diabetes mellitus type 2  with neurological manifestations ([place]) 02/26/2014  . Hyperkalemia 06/21/2013  . Diabetic nephropathy ([place]) 05/23/2013  . Diabetic neuropathy ([place]) 11/30/2012  . Macular degeneration 07/04/2012  . Diabetic retinopathy ([place]) 03/20/2012  . Osteopenia 09/22/2011  . Anemia in chronic kidney disease 08/29/2011  . Gout 04/13/2011  . Stress incontinence, female 11/11/2010  . Anxiety 08/18/2010  . Chronic systolic heart failure ([place]) 02/18/2009  . SINUS BRADYCARDIA 10/29/2008  . Peripheral vascular disease ([place]) 06/18/2008  . CKD (chronic kidney disease) stage 5, GFR less than 15 ml/min (HCC) 07/13/2007  . Chronic lymphocytic leukemia ([place]) 10/13/2006  . 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 HYPERCHOLESTEROLEMIA 03/23/2006  . Essential hypertension 03/23/2006  . Coronary atherosclerosis 03/23/2006  . REFLUX ESOPHAGITIS 03/23/2006  . IRRITABLE BOWEL SYNDROME 03/23/2006  . CYSTOCELE/RECTOCELE/PROLAPSE,UNSPEC. 03/23/2006  . Osteoarthritis, multiple sites 03/23/2006    Past Medical History: Past Medical History:  Diagnosis Date  . Anemia 08/29/2011  . Anxiety   . Arthritis    "legs" (01/22/2013)  . CAD (coronary artery disease)    s/p CABG in 1999  . Cataract   . Chronic kidney disease    Sees Dr [name]  . CLL (chronic lymphoblastic leukemia)    Kidney  . Diastolic heart failure ([place])   . GERD (gastroesophageal reflux disease)   . Gout   . H/O hiatal hernia   . Heart murmur   . Hyperlipidemia   . Hypertension   . IDDM (insulin dependent diabetes mellitus) ([place])    Type II  . PAD (peripheral artery disease) ([place])   . Paroxysmal atrial fibrillation (HCC)    a. identified on ILR as part of StrokeAF study  . Peripheral vascular disease ([place])   . Pneumonia 11/2016  . Stroke (cerebrum) ([place]) 2017   "seeing double for 2 weeks" vision normal now. Still has balance issues     Past Surgical History: Past Surgical History:  Procedure Laterality Date   . ANGIOPLASTY / STENTING FEMORAL Right 09/2010   SFA/notes 11/25/2010 (01/22/2013)  . ANGIOPLASTY / STENTING FEMORAL Left 11/2010   SFA/notes 11/25/2010 (01/22/2013)  . ANGIOPLASTY / STENTING ILIAC     [name] 11/25/2010 (01/22/2013)  . BASCILIC VEIN TRANSPOSITION Left 08/23/2017   Procedure: FIRST STAGE BASILIC VEIN TRANSPOSITION LEFT UPPER EXTREMITY;  Surgeon: [name], MD;  Location: [facility];  Service: Vascular;  Laterality: Left;  . BASCILIC VEIN TRANSPOSITION Left 10/23/2017   Procedure: SECOND STAGE BASILIC VEIN TRANSPOSITION LEFT ARM;  Surgeon: [name], MD;  Location: [facility];  Service: Vascular;  Laterality: Left;  . CATARACT EXTRACTION W/ INTRAOCULAR LENS  IMPLANT, BILATERAL Bilateral ?2011  . CHOLECYSTECTOMY  1993  . CORONARY ARTERY BYPASS GRAFT  10/1994   "CABG X3"  . EP IMPLANTABLE DEVICE N/A 09/11/2015   Procedure: Loop Recorder Insertion;  Surgeon: Thompson Grayer, MD;  Location: [facility];  Service: Cardiovascular;  Laterality: N/A;  . HEEL SPUR EXCISION Bilateral 1970's  . LOWER EXTREMITY ANGIOGRAM N/A 01/22/2013   Procedure: LOWER EXTREMITY ANGIOGRAM;  Surgeon: [name], MD;  Location: [facility] CATH LAB;  Service: Cardiovascular;  Laterality: N/A;  . SHOULDER OPEN ROTATOR CUFF REPAIR Bilateral 1990's   "2 times on 1 side; once on the other"  . TRANSLUMINAL ATHERECTOMY FEMORAL ARTERY Right 01/22/2013   & balloon  . TUBAL LIGATION  1980's  . VIDEO ASSISTED THORACOSCOPY (VATS)/EMPYEMA Left 11/29/2016   Procedure: VIDEO ASSISTED THORACOSCOPY (VATS)/EMPYEMA;  Surgeon: [name], MD;  Location[facility] OR;  Service: Thoracic;  Laterality: Left;  VIDEO ASSISTED THORACOSCOPY (VATS)/EMPYEMA    Social History: Social History   Tobacco Use  . Smoking status: Never Smoker  . Smokeless tobacco: Never Used  Substance Use Topics  . Alcohol use: No  . Drug use: No   Additional social history:  Please also refer to relevant sections of EMR.  Family  History: Family History  Problem Relation Age of Onset  . Heart disease Mother   . Heart attack Mother   . Hypertension Mother   . Kidney disease Mother   . Pneumonia Father   . Heart attack Father   . Hyperlipidemia Father   . Diabetes Sister   . Breast cancer Sister   . Arthritis Brother   . Diabetes Brother   . Heart attack Sister   . Obesity Sister   . Hypertension Sister   . Heart attack Sister   . Cancer Sister        breast  . Hyperlipidemia Sister   . Hypertension Sister   . Hyperlipidemia Sister   . Breast cancer Maternal Grandmother     Allergies and Medications: Allergies  Allergen Reactions  . Other Other (See Comments)    Darvocet-rash   . Amoxicillin Rash    Has patient had a PCN reaction causing immediate rash, facial/tongue/throat swelling, SOB or lightheadedness with hypotension: No Has patient had a PCN reaction causing severe rash involving mucus membranes or skin necrosis: No Has patient had a PCN reaction that required hospitalization: No Has patient had a PCN reaction occurring within the last 10 years: No If all of the above answers are "NO", then may proceed with Cephalosporin use.   . Codeine Phosphate Rash  . Penicillins Rash    Has patient had a PCN reaction causing immediate rash, facial/tongue/throat swelling, SOB or lightheadedness with hypotension: No Has patient had a PCN reaction causing severe rash involving mucus membranes or skin necrosis: No Has patient had a PCN reaction that required hospitalization: No Has patient had a PCN reaction occurring within the last 10 years: No If all of the above answers are "NO", then may proceed with Cephalosporin use.     No current facility-administered medications on file prior to encounter.    Current Outpatient Medications on File Prior to Encounter  Medication Sig Dispense Refill  . allopurinol (ZYLOPRIM) 100 MG tablet TAKE 1 TABLET(100 MG) BY MOUTH DAILY (Patient taking differently: Take  100 mg by mouth daily. ) 90 tablet 3  . Ascorbic Acid (VITAMIN C) 100 MG tablet Take 100 mg by mouth daily.    . carboxymethylcellulose (REFRESH PLUS) 0.5 % SOLN Place 1 drop into both eyes 3 (three) times daily as needed (dry eyes).     . ferrous sulfate 325 (65 FE) MG tablet Take 1 tablet (325 mg total) by mouth daily with breakfast.  3  . GNP GARLIC EXTRACT PO Take 1 tablet by mouth daily.     Marland Kitchen HUMALOG KWIKPEN 100 UNIT/ML KiwkPen Inject 5 Units as directed 3 (three) times daily after meals.   3  . hydrALAZINE (APRESOLINE)  50 MG tablet TAKE 1 TABLET BY MOUTH THREE TIMES DAILY (Patient taking differently: Take 50 mg by mouth 3 (three) times daily. ) 270 tablet 3  . LANTUS SOLOSTAR 100 UNIT/ML Solostar Pen ADMINISTER 35 UNITS UNDER THE SKIN DAILY (Patient taking differently: Inject 16 Units into the skin at bedtime. ) 15 mL 12  . Melatonin 10 MG CAPS Take 10 mg by mouth at bedtime as needed (sleep).     . metoprolol tartrate (LOPRESSOR) 25 MG tablet TAKE 1 TABLET BY MOUTH TWICE DAILY (Patient taking differently: Take 25 mg by mouth 2 (two) times daily. ) 180 tablet 3  . Multiple Vitamins-Minerals (EYE VITAMINS PO) Take 1 capsule by mouth daily. Taking one daily from her opthamologist (Dr. Herbert Deaner)     . Multiple Vitamins-Minerals (MULTIVITAMINS THER. W/MINERALS) TABS Take 1 tablet by mouth daily.      Marland Kitchen omega-3 acid ethyl esters (LOVAZA) 1 g capsule Take 2 g by mouth 2 (two) times daily.     . ondansetron (ZOFRAN) 4 MG tablet Take 1 tablet (4 mg total) by mouth every 8 (eight) hours as needed for nausea or vomiting. 20 tablet 1  . pantoprazole (PROTONIX) 40 MG tablet TAKE 1 TABLET BY MOUTH DAILY (Patient taking differently: Take 40 mg by mouth at bedtime. ) 90 tablet 3  . PARoxetine (PAXIL) 40 MG tablet Take 1 tablet (40 mg total) by mouth every morning. 90 tablet 3  . rosuvastatin (CRESTOR) 20 MG tablet Take 1 tablet (20 mg total) by mouth daily. (Patient taking differently: Take 20 mg by mouth at  bedtime. ) 30 tablet 6  . torsemide (DEMADEX) 100 MG tablet Take 1 tablet (100 mg total) by mouth daily. 135 tablet 3  . traMADol (ULTRAM) 50 MG tablet Take 1 tablet (50 mg total) by mouth every 6 (six) hours as needed (for pain.). (Patient taking differently: Take 100 mg by mouth every 6 (six) hours as needed (for pain.). ) 12 tablet 0  . warfarin (COUMADIN) 5 MG tablet Take 1 tablet (5 mg total) by mouth daily. (Patient taking differently: Take 5 mg by mouth See admin instructions. Goes to the clinic every Monday    Take 0.5 tablet (2.5 mg) by mouth on Sundays, Tuesdays, Wednesdays, Thursdays, Saturdays. Take 1 tablet (5 mg) by mouth on Mondays & Fridays.) 30 tablet 0  . ACCU-CHEK AVIVA PLUS test strip TEST THREE TIMES A DAY 300 each 3  . UNKNOWN TO PATIENT LOVENOX, pt unsure of dose      Objective: BP (!) 154/86   Pulse (!) 114   Temp 97.8 F (36.6 C) (Rectal)   Resp (!) 23   Ht 5\' 5"  (1.651 m)   Wt 73.3 kg   SpO2 95%   BMI 26.89 kg/m  Exam: General: No acute distress, resting comfortably, elderly Caucasian female Eyes: EOMI, PERRLA ENTM: Tongue midline, no external trauma. Neck: Symmetric, no focal tenderness. Cardiovascular: Regular rate and rhythm, no MRG.  2+ pitting edema bilateral lower extremity.  Palpable thrill in left upper extremity brachiocephalic fistula. Respiratory: Crackles noted in lower and mid right lung.  Left lung clear to auscultation.  4 L nasal cannula.  No accessory muscle use, no distress. Gastrointestinal: Soft, nontender to palpation, nondistended. MSK: 5 out of 5 strength bilateral lower, bilateral upper extremity. Derm: Warm, dry Neuro: CN II through XII intact, no focal neurologic deficit. Psych: Pleasant, alert, appropriate  Labs and Imaging: CBC BMET  Recent Labs  Lab 10/27/17 1126  WBC 15.0*  HGB 10.2*  HCT 32.0*  PLT 224   Recent Labs  Lab 10/27/17 1126  NA 138  K 4.5  CL 101  CO2 20*  BUN 129*  CREATININE 7.21*  GLUCOSE 119*   CALCIUM 9.1      Guadalupe Dawn, MD 10/27/2017, 6:34 PM PGY-2, [place] Intern pager: [phone], text pages welcome

## 2017-10-27 NOTE — Progress Notes (Signed)
ANTICOAGULATION CONSULT NOTE - Initial Consult  Pharmacy Consult for Warfarin Indication: atrial fibrillation  Allergies  Allergen Reactions  . Other Other (See Comments)    Darvocet-rash   . Amoxicillin Rash    Has patient had a PCN reaction causing immediate rash, facial/tongue/throat swelling, SOB or lightheadedness with hypotension: No Has patient had a PCN reaction causing severe rash involving mucus membranes or skin necrosis: No Has patient had a PCN reaction that required hospitalization: No Has patient had a PCN reaction occurring within the last 10 years: No If all of the above answers are "NO", then may proceed with Cephalosporin use.   . Codeine Phosphate Rash  . Penicillins Rash    Has patient had a PCN reaction causing immediate rash, facial/tongue/throat swelling, SOB or lightheadedness with hypotension: No Has patient had a PCN reaction causing severe rash involving mucus membranes or skin necrosis: No Has patient had a PCN reaction that required hospitalization: No Has patient had a PCN reaction occurring within the last 10 years: No If all of the above answers are "NO", then may proceed with Cephalosporin use.      Patient Measurements: Height: 5\' 5"  (165.1 cm) Weight: 161 lb 9.6 oz (73.3 kg) IBW/kg (Calculated) : 57  Vital Signs: Temp: 97.8 F (36.6 C) (10/04 1315) Temp Source: Rectal (10/04 1315) BP: 154/86 (10/04 1630) Pulse Rate: 114 (10/04 1630)  Labs: Recent Labs    10/27/17 1126  HGB 10.2*  HCT 32.0*  PLT 224  LABPROT 20.6*  INR 1.78  CREATININE 7.21*    Estimated Creatinine Clearance: 7.1 mL/min (A) (by C-G formula based on SCr of 7.21 mg/dL (H)).   Medical History: Past Medical History:  Diagnosis Date  . Anemia 08/29/2011  . Anxiety   . Arthritis    "legs" (01/22/2013)  . CAD (coronary artery disease)    s/p CABG in 1999  . Cataract   . Chronic kidney disease    Sees Dr [name]  . CLL (chronic lymphoblastic leukemia)    Kidney  . Diastolic heart failure ([place])   . GERD (gastroesophageal reflux disease)   . Gout   . H/O hiatal hernia   . Heart murmur   . Hyperlipidemia   . Hypertension   . IDDM (insulin dependent diabetes mellitus) ([place])    Type II  . PAD (peripheral artery disease) ([place])   . Paroxysmal atrial fibrillation (HCC)    a. identified on ILR as part of StrokeAF study  . Peripheral vascular disease ([place])   . Pneumonia 11/2016  . Stroke (cerebrum) ([place]) 2017   "seeing double for 2 weeks" vision normal now. Still has balance issues       Assessment: 72 yo female on chronic warfarin therapy for atrial fibrillation.  Admitted for abdominal pain thought to be associated with possible fluid overload/pleural effusion. Pharmacy consulted to dose warfarin as an inpatient.  Home warfarin regimen is 2.5 mg by mouth on Sundays, Tuesdays, Wednesdays, Thursdays, Saturdays and 5 mg by mouth on Mondays & Fridays.  INR on admission 1.78  Goal of Therapy:  INR 2-3   Plan:  Will give home dose this evening of 5 mg po x 1 Monitor INR daily and watch for signs and symptoms of bleeding.    Alanda Slim, PharmD, [facility] Clinical Pharmacist Please see AMION for all Pharmacists' Contact Phone Numbers 10/27/2017, 5:30 PM

## 2017-10-27 NOTE — Telephone Encounter (Addendum)
Mckensie Scotti called along with a friend Christine Santos.  Christine Santos called crying stating she is having sharp pains to abdomen on her right side, gagging, and feeling horrible.  Christine Santos states she has had zofran, Tramadol, and dramamine.  Advised Christine Santos to go to the Emergency room.  Both state that was their plan but they wanted Christine Santos to be aware.  They states they were going to call the ambulance when the hung up the phone. Pricilla Riffle RN

## 2017-10-28 LAB — BLOOD CULTURE ID PANEL (REFLEXED)
Acinetobacter baumannii: NOT DETECTED
CANDIDA GLABRATA: NOT DETECTED
Candida albicans: NOT DETECTED
Candida krusei: NOT DETECTED
Candida parapsilosis: NOT DETECTED
Candida tropicalis: NOT DETECTED
Carbapenem resistance: NOT DETECTED
ENTEROCOCCUS SPECIES: NOT DETECTED
Enterobacter cloacae complex: DETECTED — AB
Enterobacteriaceae species: DETECTED — AB
Escherichia coli: NOT DETECTED
HAEMOPHILUS INFLUENZAE: NOT DETECTED
Klebsiella oxytoca: NOT DETECTED
Klebsiella pneumoniae: NOT DETECTED
LISTERIA MONOCYTOGENES: NOT DETECTED
NEISSERIA MENINGITIDIS: NOT DETECTED
Proteus species: NOT DETECTED
Pseudomonas aeruginosa: NOT DETECTED
SERRATIA MARCESCENS: NOT DETECTED
STAPHYLOCOCCUS AUREUS BCID: NOT DETECTED
STAPHYLOCOCCUS SPECIES: NOT DETECTED
STREPTOCOCCUS AGALACTIAE: NOT DETECTED
STREPTOCOCCUS PNEUMONIAE: NOT DETECTED
STREPTOCOCCUS SPECIES: NOT DETECTED
Streptococcus pyogenes: NOT DETECTED

## 2017-10-28 LAB — CBC WITH DIFFERENTIAL/PLATELET
ABS IMMATURE GRANULOCYTES: 0.1 10*3/uL (ref 0.0–0.1)
BASOS PCT: 0 %
Basophils Absolute: 0 10*3/uL (ref 0.0–0.1)
EOS ABS: 0 10*3/uL (ref 0.0–0.7)
Eosinophils Relative: 0 %
HCT: 28.6 % — ABNORMAL LOW (ref 36.0–46.0)
Hemoglobin: 9.2 g/dL — ABNORMAL LOW (ref 12.0–15.0)
Immature Granulocytes: 1 %
Lymphocytes Relative: 10 %
Lymphs Abs: 1.5 10*3/uL (ref 0.7–4.0)
MCH: 31.3 pg (ref 26.0–34.0)
MCHC: 32.2 g/dL (ref 30.0–36.0)
MCV: 97.3 fL (ref 78.0–100.0)
MONO ABS: 0.7 10*3/uL (ref 0.1–1.0)
MONOS PCT: 4 %
Neutro Abs: 13.2 10*3/uL — ABNORMAL HIGH (ref 1.7–7.7)
Neutrophils Relative %: 85 %
PLATELETS: 214 10*3/uL (ref 150–400)
RBC: 2.94 MIL/uL — ABNORMAL LOW (ref 3.87–5.11)
RDW: 17.9 % — ABNORMAL HIGH (ref 11.5–15.5)
WBC: 15.4 10*3/uL — ABNORMAL HIGH (ref 4.0–10.5)

## 2017-10-28 LAB — GLUCOSE, CAPILLARY
GLUCOSE-CAPILLARY: 98 mg/dL (ref 70–99)
Glucose-Capillary: 118 mg/dL — ABNORMAL HIGH (ref 70–99)
Glucose-Capillary: 135 mg/dL — ABNORMAL HIGH (ref 70–99)

## 2017-10-28 LAB — RENAL FUNCTION PANEL
Albumin: 2.5 g/dL — ABNORMAL LOW (ref 3.5–5.0)
Anion gap: 16 — ABNORMAL HIGH (ref 5–15)
BUN: 138 mg/dL — ABNORMAL HIGH (ref 8–23)
CO2: 21 mmol/L — ABNORMAL LOW (ref 22–32)
CREATININE: 7.66 mg/dL — AB (ref 0.44–1.00)
Calcium: 8.7 mg/dL — ABNORMAL LOW (ref 8.9–10.3)
Chloride: 99 mmol/L (ref 98–111)
GFR, EST AFRICAN AMERICAN: 5 mL/min — AB (ref >60)
GFR, EST NON AFRICAN AMERICAN: 5 mL/min — AB (ref >60)
Glucose, Bld: 138 mg/dL — ABNORMAL HIGH (ref 70–99)
POTASSIUM: 5.2 mmol/L — AB (ref 3.5–5.1)
Phosphorus: 5.7 mg/dL — ABNORMAL HIGH (ref 2.5–4.6)
Sodium: 136 mmol/L (ref 135–145)

## 2017-10-28 LAB — PROTIME-INR
INR: 2.54
PROTHROMBIN TIME: 27.2 s — AB (ref 11.4–15.2)

## 2017-10-28 LAB — TROPONIN I: TROPONIN I: 0.18 ng/mL — AB (ref <0.03)

## 2017-10-28 LAB — MRSA PCR SCREENING: MRSA by PCR: NEGATIVE

## 2017-10-28 MED ORDER — ALLOPURINOL 100 MG PO TABS
100.0000 mg | ORAL_TABLET | Freq: Every day | ORAL | Status: DC
  Administered 2017-10-28 – 2017-10-30 (×3): 100 mg via ORAL
  Filled 2017-10-28 (×4): qty 1

## 2017-10-28 MED ORDER — DEXTROSE 5 % IV SOLN
500.0000 mg | INTRAVENOUS | Status: DC
  Administered 2017-10-29: 500 mg via INTRAVENOUS
  Filled 2017-10-28 (×2): qty 0.5

## 2017-10-28 MED ORDER — TRAMADOL HCL 50 MG PO TABS
50.0000 mg | ORAL_TABLET | Freq: Four times a day (QID) | ORAL | Status: DC | PRN
  Administered 2017-10-29: 50 mg via ORAL
  Filled 2017-10-28: qty 1

## 2017-10-28 MED ORDER — SODIUM CHLORIDE 0.9 % IV SOLN
1.0000 g | Freq: Once | INTRAVENOUS | Status: AC
Start: 2017-10-28 — End: 2017-10-28
  Administered 2017-10-28: 1 g via INTRAVENOUS
  Filled 2017-10-28: qty 1

## 2017-10-28 MED ORDER — WARFARIN SODIUM 2.5 MG PO TABS
2.5000 mg | ORAL_TABLET | Freq: Once | ORAL | Status: AC
  Administered 2017-10-28: 2.5 mg via ORAL
  Filled 2017-10-28: qty 1

## 2017-10-28 MED ORDER — FUROSEMIDE 10 MG/ML IJ SOLN: 80.0000 mg | Freq: Two times a day (BID) | INTRAMUSCULAR | Status: DC

## 2017-10-28 MED ORDER — SODIUM CHLORIDE 0.9 % IV SOLN: INTRAVENOUS | Status: DC | PRN

## 2017-10-28 MED ORDER — FUROSEMIDE 10 MG/ML IJ SOLN: 100.0000 mg | Freq: Once | INTRAVENOUS | Status: DC

## 2017-10-28 MED ORDER — FUROSEMIDE 10 MG/ML IJ SOLN
80.0000 mg | Freq: Two times a day (BID) | INTRAMUSCULAR | Status: DC
  Administered 2017-10-28 – 2017-10-29 (×2): 80 mg via INTRAVENOUS
  Filled 2017-10-28 (×2): qty 8

## 2017-10-28 MED ORDER — PANTOPRAZOLE SODIUM 40 MG PO TBEC
40.0000 mg | DELAYED_RELEASE_TABLET | Freq: Every day | ORAL | Status: DC
  Administered 2017-10-28 – 2017-11-04 (×8): 40 mg via ORAL
  Filled 2017-10-28 (×8): qty 1

## 2017-10-28 NOTE — Progress Notes (Addendum)
Family Medicine Teaching Service Daily Progress Note Intern Pager: [phone]  Patient name: Christine Santos Medical record number: [id number] Date of birth: 1945-03-23 Age: 72 y.o. Gender: female  Primary Care Provider: [name], MD Consultants: nephrology Code Status: full  Pt Overview and Major Events to Date:  10/4 admitted with abdominal pain  Assessment and Plan: Christine Santos is a 72 y.o. female who presented with abdominal pain found to have enterobacter bacteremia. 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  Enterobacter bacteremia in the setting of abdominal pain. Unknown source.  Presented with abdominal pain and vomiting/diarrhea, has been afebrile but does have a leukocytosis WBC 15.4. Was found to have free intraperitoneal fluid on CT abd/pelvis thought to be due a ruptured known ovarian cyst as US pelvis no longer showed the R ovarian cyst.  Abdominal pain now largely resolved after fentanyl x2 yesterday  Which make it more likely that it was from the ruptured ovarian cyst. UA on admit notable for large leuks with many bacteria but without nitrites. Patient had recent outpt treatment for UTI 1 week ago that she reports with treated by nephrology - continue cefepime (10/5-) - follow BCx for sensitivities - UCx pending - trend WBC - DC prn dilaudid 2mg  q6prn, restart home tramadol - prn zofran - appreciate nephrology assistance regarding UTI history, would be helpful if there was an outpatient urine culture   Acute hypoxic respiratory failure with B/L pleural effusions, improved. Likely HFpEF exacerbation. CT chest showing R>L pleural effusion with ground glass opacity likely pulmonary edema. Patient had presented with new oxygen requirement of 4L via nasal cannula but now on 2L after IV diuresis. However only 55cc UOP overnight and weight is up to 165lbs from dry weight  of ~160lbs. Is on torsemide 100mg  qd at home - hold off pulm consult since improving with diuresis  - strict I/Os - daily wts - diurese with increased dose of  IV lasix 80mg  x2 - keep O2 > 92% but wean as tolerated - if no improvement with diuresis, low threshold for diagnostic thoracentesis  Elevated trop 0.18 overnight. Uncertain why this was obtained as patient is CKD 5 with higher baseline values in the past  - will not trend as patient does not have chest pain and EKG on admit showed afib without ST changes.    Elevated Cr in CKD 5. K 5.2 with BUN 138. Followed by nephrology and undergoing preparation for dialysis with recent basilic vein transposition L UE on 10/23/17. - nephrology consulted, appreciate recommendations - monitor renal function panel  Atrial fibrillation. Stable.  - continue home metoprolol  - continue warfarin per pharm  Hypertension, stable. Normotensive this am BP 122/78 -Continue home hydralazine 50 mg 3 times daily and lopressor\ 25 mg twice daily -monitor vitals  Type 2 diabetes, stable. Last A1c 6.2 on 12/13/2017.   Lantus 16 units plus sliding scale at home. CBG this am 98. -Follow-up A1c -Lantus 16 units, may need to decrease pending CBGs -sSSI - monitor CBGs  Hyperlipidemia, stable. -Continue home rosuvastatin 20 mg  Anxiety/depression, stable. - continue home paxil 40 mg.  Pulmonary nodule. Incidentally found on CT. L lower lobe 56mm nodule, increased from 22mm in 2015 with likely increased size from adjacent atelectasis so thought to stable and benign. - follow up outpatient    FEN/GI: renal diet, protonix PPx: warfarin  Disposition: pending medical improvement  Subjective:  States feels well this morning without abdominal pain. He states the fentanyl yesterday helped greatly and she has not needed any more pain medication. No fever/chills. No chest pain. No nausea or vomiting. Did have a couple of episodes of diarrhea but none this  morning. Denies dysuria, urinary urgency or frequency. Does not feel short of breath with the oxygen on. She is not on oxygen at home.   Objective: Temp:  [97.8 F (36.6 C)-98.5 F (36.9 C)] 98.2 F (36.8 C) (10/05 0800) Pulse Rate:  [99-115] 102 (10/05 0408) Resp:  [18-28] 18 (10/05 0800) BP: (116-167)/(63-92) 122/78 (10/05 0800) SpO2:  [95 %-100 %] 100 % (10/05 0800) Weight:  [73.3 kg-75.2 kg] 75.2 kg (10/05 0500) Physical Exam: General: sitting up in bed, in NAD  Cardiovascular: irregularly irregular Respiratory: diminished lung sounds in bases, no wheezes or rhonchi. Normal work of breathing with nasal cannula in place Abdomen: soft, slightly tender to deep palpation in RLQ but no other areas are tender, nondistended, + bowel sounds. No rebound or guarding.  Extremities: 2+ pitting edema in LE. L UE with bruising around surgical incision that is clean and dry without erythema.   Laboratory: Recent Labs  Lab 10/23/17 0806 10/27/17 1126 10/28/17 0259  WBC  --  15.0* 15.4*  HGB 10.5* 10.2* 9.2*  HCT 31.0* 32.0* 28.6*  PLT  --  224 214   Recent Labs  Lab 10/23/17 0806 10/27/17 1126 10/28/17 0259  NA 138 138 136  K 3.8 4.5 5.2*  CL  --  101 99  CO2  --  20* 21*  BUN  --  129* 138*  CREATININE  --  7.21* 7.66*  CALCIUM  --  9.1 8.7*  PROT  --  5.2*  --   BILITOT  --  1.1  --   ALKPHOS  --  67  --   ALT  --  20  --   AST  --  33  --   GLUCOSE 125* 119* 138*    Troponin I: 0.18  Troponin (Point of Care Test) Recent Labs    10/27/17 1443  TROPIPOC 0.22*    Imaging/Diagnostic Tests: Ct Abdomen Pelvis Wo Contrast  Result Date: 10/27/2017 CLINICAL DATA:  Right lower quadrant abdominal pain. Diarrhea. Vomiting. Dialysis patient. EXAM: CT ABDOMEN AND PELVIS WITHOUT CONTRAST TECHNIQUE: Multidetector CT imaging of the abdomen and pelvis was performed following the standard protocol without IV contrast. COMPARISON:  02/28/2017 CT pelvis FINDINGS: Lower chest: Large  right effusion layering dependently with dependent pulmonary atelectasis. Tiny left effusion with mild left base atelectasis. Cardiomegaly. Hepatobiliary: Liver parenchyma appears normal. Previous cholecystectomy. Arterial calcification. Pancreas: Normal Spleen: Normal Adrenals/Urinary Tract: Adrenal glands are normal. Renal parenchyma shows mild generalized volume loss. There is a 1.5 cm hyperdense cyst of the lateral left kidney. There is mild fullness of the right renal collecting system and ureter. I do not see evidence of stone disease. Bladder appears normal. Stomach/Bowel: No proximal bowel abnormality is seen. There is sigmoid diverticulosis. Vascular/Lymphatic: Aortic atherosclerosis but no aneurysm. Widespread vascular calcification. Reproductive: Uterus appears normal. Chronic right-sided ovarian cysts is evident as was seen on the previous CT. This is surrounded by fluid in the right lower quadrant and free fluid in the pelvis. I cannot rule out the possibility of inflammatory disease/abscess in addition. Other: Small amount of free fluid around the liver. Musculoskeletal: Chronic curvature in degenerative change of the spine. There is a ventral hernia just to  the left of the umbilicus containing only fat. No herniated bowel. IMPRESSION: Large right pleural effusion with dependent pulmonary atelectasis. Cardiomegaly. Free intraperitoneal fluid. This is most prominent in the pelvis. There is a right ovarian cyst as was seen on the previous study, surrounded by the fluid. I cannot rule out the possibility of inflammatory disease in the pelvis. I cannot identify any acute bowel pathology. Widespread and extensive vascular calcification. Fullness of the right renal collecting system and proximal ureter, possibly secondary to the right-sided pelvic process seen above. Ventral hernia containing only fat. Because of the indeterminate findings, one might consider a pelvic ultrasound and/or abdominal CT with oral  and intravenous contrast if possible. Electronically Signed   By: Nelson Chimes M.D.   On: 10/27/2017 13:22   Dg Chest 2 View  Result Date: 10/27/2017 CLINICAL DATA:  Hypoxia.  Tachycardia. EXAM: CHEST - 2 VIEW COMPARISON:  04/10/2017. FINDINGS: Prior CABG. Cardiomegaly with diffuse bilateral from interstitial prominence and bilateral pleural effusions. Findings consistent with CHF. Gastric distention noted. IMPRESSION: 1. Prior CABG. Cardiomegaly with diffuse bilateral from interstitial prominence and bilateral pleural effusions consistent CHF. 2.  Gastric distention. Electronically Signed   By: Marcello Moores  Register   On: 10/27/2017 12:32   Ct Chest Wo Contrast  Result Date: 10/28/2017 CLINICAL DATA:  Pleural effusion. EXAM: CT CHEST WITHOUT CONTRAST TECHNIQUE: Multidetector CT imaging of the chest was performed following the standard protocol without IV contrast. COMPARISON:  Chest radiograph earlier this day. Lung bases from abdominal CT earlier this day. Chest CT 11/26/2016 FINDINGS: Cardiovascular: Post CABG. Aortic atherosclerosis. 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 Multi chamber cardiomegaly. Calcification of the native coronary arteries. No pericardial effusion. Mediastinum/Nodes: Small upper mediastinal lymph nodes not enlarged by size criteria. Hilar evaluation limited by absence of IV contrast. The esophagus is nondistended. Heterogeneous thyroid gland without dominant nodule. Lungs/Pleura: Large layering right pleural effusion without significant loculation. Near complete compressive atelectasis of the right lower lobe. Lesser compressive atelectasis of the right upper lobe. Mild fluid tracking in the fissures and adjacent atelectasis. Small right pleural effusion with fluid tracking in the fissures. Minimal septal thickening in the left lower lobe with patchy ground-glass opacity. 7 mm perifissural nodule in the left lower lobe image 72 series 4, previously obscured.  Trachea and mainstem bronchi are patent. Upper Abdomen: Vascular calcifications.  No acute findings. Musculoskeletal: Multilevel degenerative change in the spine. There are no acute or suspicious osseous abnormalities. IMPRESSION: 1. Large layering right pleural effusion with compressive atelectasis. 2. Small left pleural effusion. Minimal septal thickening and ground-glass opacity in the left lower lobe is likely pulmonary edema. 3. Left lower lobe pulmonary nodule measures 7 mm. This nodule measured 5 mm on PET CT 01/10/2014. Lack of significant or interval growth favors a benign etiology, likely post infective or inflammatory. Apparent 2 mm increase in size is likely due to adjacent atelectasis. 4. Multi chamber cardiomegaly. Coronary artery calcifications, post CABG. Aortic Atherosclerosis (ICD10-I70.0). Electronically Signed   By: Keith Rake M.D.   On: 10/28/2017 00:24   US Pelvis (transabdominal Only)  Result Date: 10/28/2017 CLINICAL DATA:  Pelvic pain. EXAM: TRANSABDOMINAL ULTRASOUND OF PELVIS TECHNIQUE: Transabdominal ultrasound examination of the pelvis was performed including evaluation of the uterus, ovaries, adnexal regions, and pelvic cul-de-sac. COMPARISON:  CT scan February 28, 2017 October 27, 2017 FINDINGS: Uterus Measurements: 5.1 x 2.0 x 3.3 cm. No fibroids or other mass visualized. Endometrium Thickness: 2.8 mm.  No focal abnormality visualized. Right ovary  Measurements: 3.1 x 2.4 x 1.8 cm. The right ovarian cyst seen on CT imaging is not appreciated on this study. Left ovary Measurements: 3.4 x 2.8 x 2.9 cm. Normal appearance/no adnexal mass. Other findings: A small amount of fluid in the pelvis is nonspecific. IMPRESSION: 1. The known right ovarian cyst seen on previous CT imaging is not appreciated on this ultrasound. 2. Free fluid in the pelvis is nonspecific. 3. No other abnormalities noted. Electronically Signed   By: Dorise Bullion III M.D   On: 10/28/2017 00:04    Bufford Lope, DO 10/28/2017, 8:06 AM PGY-3, [facility] Intern pager: [phone], text pages welcome

## 2017-10-28 NOTE — Progress Notes (Signed)
I received a Eschbach to provide spiritual support for the patient. I visited the patient with a special friend, Rush Landmark, in the room. I listened to the patient with compassion, shared words of encouragement, and led in prayer. I shared that the Chaplain is available for additional support as needed or requested.    10/28/17 1800  Clinical Encounter Type  Visited With Patient and family together  Visit Type Spiritual support  Referral From Nurse  Consult/Referral To Chaplain  Spiritual Encounters  Spiritual Needs Prayer;Emotional  Stress Factors  Patient Stress Factors None identified  Family Stress Factors None identified    Chaplain Dr Redgie Grayer

## 2017-10-28 NOTE — Consult Note (Signed)
[facility] Renal Consultation Note  Requesting MD:  Dr Kris Mouton Indication for Consultation:  CKD 5  HPI: Christine Santos is a 72 y.o. female with CKD 5, h/o CHF (08/2015 TTE normal EF grade 1 DD), DM, h/o C difficile, HL, IBS, A fib, CAD s/p CABG on coumadin who is currently admitted with abdominal pain and nephrology is requested to evaluate for worsening renal function.    Presented yesterday with acute onset of abdominal pain.  CT without contrast with R ovarian cyst and pelvic fluid.  Also fullness of R renal collecting system and prox ureter ? Secondary to R sided pelvic process.  Pelvic US did not visualize the cyst.  She had a chest CT which showed large R pleural effusion with atelectasis; pulmonary to be consulted.  Cr 7.2 on admission 7.7 today, BUN 138.  She endorses dysgeusia, edema.    Blood cultures shows enterobacter.     Patient has known baseline CKD 5 with Cr mid 4s (9/25 4.65).  She underwent BB AVF transposition 9/30, 1st stage 7/31.  She follows with Dr. [name].    Creatinine  Date/Time Value Ref Range Status  09/06/2016 12:40 PM 3.4 (HH) 0.6 - 1.1 mg/dL Final  09/08/2015 12:30 PM 5.5 (HH) 0.6 - 1.1 mg/dL Final  03/10/2015 12:54 PM 3.0 (HH) 0.6 - 1.1 mg/dL Final  09/04/2014 12:59 PM 2.5 (H) 0.6 - 1.1 mg/dL Final  06/04/2014 08:53 AM 2.5 (H) 0.6 - 1.1 mg/dL Final  05/07/2014 08:25 AM 2.9 (H) 0.6 - 1.1 mg/dL Final  04/09/2014 08:56 AM 2.4 (H) 0.6 - 1.1 mg/dL Final  03/12/2014 09:27 AM 2.1 (H) 0.6 - 1.1 mg/dL Final  02/12/2014 09:40 AM 2.5 (H) 0.6 - 1.1 mg/dL Final  01/29/2014 09:46 AM 2.0 (H) 0.6 - 1.1 mg/dL Final  01/22/2014 12:30 PM 2.4 (H) 0.6 - 1.1 mg/dL Final  01/14/2014 08:09 AM 2.4 (H) 0.6 - 1.1 mg/dL Final  01/01/2014 11:17 AM 2.3 (H) 0.6 - 1.1 mg/dL Final  10/17/2013 01:14 PM 2.6 (H) 0.6 - 1.1 mg/dL Final  08/16/2013 12:50 PM 2.0 (H) 0.6 - 1.1 mg/dL Final  07/05/2013 01:22 PM 2.3 (H) 0.6 - 1.1 mg/dL Final  06/21/2013 12:49 PM 2.2 (H) 0.6 -  1.1 mg/dL Final  02/22/2013 01:00 PM 2.2 (H) 0.6 - 1.1 mg/dL Final  10/23/2012 12:37 PM 1.8 (H) 0.6 - 1.1 mg/dL Final  07/30/2012 01:36 PM 2.2 (H) 0.6 - 1.1 mg/dL Final  05/28/2012 12:59 PM 2.6 (H) 0.6 - 1.1 mg/dL Final  02/29/2012 01:28 PM 1.9 (H) 0.6 - 1.1 mg/dL Final  11/29/2011 12:59 PM 2.0 (H) 0.6 - 1.1 mg/dL Final   Creat  Date/Time Value Ref Range Status  03/30/2016 09:38 AM 3.18 (H) 0.60 - 0.93 mg/dL Final    Comment:      For patients > or = 73 years of age: The upper reference limit for Creatinine is approximately 13% higher for people identified as African-American.     11/25/2015 02:02 PM 3.29 (H) 0.60 - 0.93 mg/dL Final    Comment:      For patients > or = 73 years of age: The upper reference limit for Creatinine is approximately 13% higher for people identified as African-American.     11/19/2015 11:37 AM 2.89 (H) 0.60 - 0.93 mg/dL Final    Comment:      For patients > or = 73 years of age: The upper reference limit for Creatinine is approximately 13% higher for people identified as African-American.  11/04/2015 03:24 PM 2.89 (H) 0.60 - 0.93 mg/dL Final    Comment:      For patients > or = 72 years of age: The upper reference limit for Creatinine is approximately 13% higher for people identified as African-American.     10/14/2015 11:42 AM 2.56 (H) 0.60 - 0.93 mg/dL Final    Comment:      For patients > or = 73 years of age: The upper reference limit for Creatinine is approximately 13% higher for people identified as African-American.     09/30/2015 04:32 PM 3.04 (H) 0.60 - 0.93 mg/dL Final    Comment:      For patients > or = 73 years of age: The upper reference limit for Creatinine is approximately 13% higher for people identified as African-American.     07/02/2015 11:11 AM 2.68 (H) 0.60 - 0.93 mg/dL Final  09/11/2013 02:08 PM 2.48 (H) 0.50 - 1.10 mg/dL Final  05/22/2013 02:21 PM 2.14 (H) 0.50 - 1.10 mg/dL Final  09/08/2010 08:51 AM  1.63 (H) 0.50 - 1.10 mg/dL Final  08/17/2010 08:50 AM 1.74 (H) 0.50 - 1.10 mg/dL Final  05/26/2010 08:48 AM 1.77 (H) 0.40 - 1.20 mg/dL Final  05/19/2010 04:33 PM 2.44 (H) 0.40 - 1.20 mg/dL Final    Comment:    Result repeated and verified.   Creatinine, Ser  Date/Time Value Ref Range Status  10/28/2017 02:59 AM 7.66 (H) 0.44 - 1.00 mg/dL Final  10/27/2017 11:26 AM 7.21 (H) 0.44 - 1.00 mg/dL Final  10/18/2017 02:24 PM 4.65 (H) 0.57 - 1.00 mg/dL Final  07/11/2017 11:44 AM 4.13 (H) 0.57 - 1.00 mg/dL Final  07/05/2017 02:19 PM 5.74 (H) 0.57 - 1.00 mg/dL Final  05/04/2017 01:46 PM 3.18 (H) 0.57 - 1.00 mg/dL Final  04/11/2017 10:57 PM 2.80 (H) 0.44 - 1.00 mg/dL Final  03/09/2017 12:04 PM 2.76 (H) 0.57 - 1.00 mg/dL Final  02/16/2017 11:04 AM 2.62 (H) 0.57 - 1.00 mg/dL Final  01/25/2017 11:40 AM 2.94 (H) 0.57 - 1.00 mg/dL Final  01/05/2017 11:46 AM 2.23 (H) 0.57 - 1.00 mg/dL Final  12/20/2016 10:32 AM 3.64 (H) 0.57 - 1.00 mg/dL Final  12/13/2016 09:14 AM 3.26 (H) 0.57 - 1.00 mg/dL Final  12/07/2016 07:07 AM 3.34 (H) 0.44 - 1.00 mg/dL Final  12/06/2016 03:00 AM 3.99 (H) 0.44 - 1.00 mg/dL Final  12/05/2016 04:36 AM 4.69 (H) 0.44 - 1.00 mg/dL Final  12/04/2016 02:18 AM 5.53 (H) 0.44 - 1.00 mg/dL Final  12/03/2016 04:07 AM 6.14 (H) 0.44 - 1.00 mg/dL Final  12/02/2016 03:55 AM 6.71 (H) 0.44 - 1.00 mg/dL Final  12/01/2016 04:02 AM 7.35 (H) 0.44 - 1.00 mg/dL Final  11/30/2016 04:23 AM 6.92 (H) 0.44 - 1.00 mg/dL Final  11/29/2016 02:30 AM 6.54 (H) 0.44 - 1.00 mg/dL Final  11/28/2016 09:08 PM 6.60 (H) 0.44 - 1.00 mg/dL Final  11/28/2016 04:27 AM 6.20 (H) 0.44 - 1.00 mg/dL Final  11/27/2016 09:49 AM 5.90 (H) 0.44 - 1.00 mg/dL Final  11/26/2016 02:29 AM 4.13 (H) 0.44 - 1.00 mg/dL Final  11/25/2016 03:03 PM 3.68 (H) 0.44 - 1.00 mg/dL Final  10/26/2015 12:08 PM 2.76 (H) 0.44 - 1.00 mg/dL Final  09/03/2015 10:41 AM 2.27 (H) 0.44 - 1.00 mg/dL Final  12/24/2013 08:28 AM 2.26 (H) 0.50 - 1.10 mg/dL Final   02/08/2013 08:54 AM 1.9 (H) 0.4 - 1.2 mg/dL Final  01/23/2013 07:15 AM 2.12 (H) 0.50 - 1.10 mg/dL Final  01/22/2013 09:20 AM 1.94 (H) 0.50 -  1.10 mg/dL Final  01/15/2013 11:16 AM 2.0 (H) 0.4 - 1.2 mg/dL Final  08/29/2011 01:35 PM 1.92 (H) 0.50 - 1.10 mg/dL Final  05/30/2011 12:42 PM 2.00 (H) 0.50 - 1.10 mg/dL Final  02/28/2011 01:29 PM 1.72 (H) 0.50 - 1.10 mg/dL Final  12/13/2010 02:37 PM 1.91 (H) 0.50 - 1.10 mg/dL Final  11/26/2010 06:34 AM 1.38 (H) 0.50 - 1.10 mg/dL Final  10/18/2010 01:23 PM 2.23 (H) 0.50 - 1.10 mg/dL Final    Comment:    Result repeated and verified.  09/23/2010 09:07 AM 2.00 (H) 0.50 - 1.10 mg/dL Final  08/11/2010 01:43 PM 1.89 (H) 0.50 - 1.10 mg/dL Final  06/07/2010 01:08 PM 1.84 (H) 0.40 - 1.20 mg/dL Final  04/14/2010 01:29 PM 1.69 (H) 0.40 - 1.20 mg/dL Final  02/15/2010 01:01 PM 1.61 (H) 0.40 - 1.20 mg/dL Final  01/20/2010 12:33 PM 1.73 (H) 0.40 - 1.20 mg/dL Final  12/30/2009 08:13 PM 1.62 (H) 0.40 - 1.20 mg/dL Final  12/21/2009 01:16 PM 2.03 (H) 0.40 - 1.20 mg/dL Final    Comment:    Result repeated and verified.  12/04/2009 12:11 PM 1.80 (H) 0.40 - 1.20 mg/dL Final  11/30/2009 01:12 PM 1.66 (H) 0.40 - 1.20 mg/dL Final  11/23/2009 11:31 AM 1.74 (H) 0.40 - 1.20 mg/dL Final  11/23/2009 1.74 mg/dL      PMHx:   Past Medical History:  Diagnosis Date  . Anemia 08/29/2011  . Anxiety   . Arthritis    "legs" (01/22/2013)  . CAD (coronary artery disease)    s/p CABG in 1999  . Cataract   . Chronic kidney disease    Sees Dr [name]  . CLL (chronic lymphoblastic leukemia)    [facility]  . Diastolic heart failure ([place])   . GERD (gastroesophageal reflux disease)   . Gout   . H/O hiatal hernia   . Heart murmur   . Hyperlipidemia   . Hypertension   . 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 PAD (peripheral artery disease) ([place])   . Paroxysmal atrial fibrillation ([facility])    a. identified on ILR as part of StrokeAF study  . Peripheral  vascular disease ([place])   . Pneumonia 11/2016  . 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 Still has balance issues     Past Surgical History:  Procedure Laterality Date  . ANGIOPLASTY / STENTING FEMORAL Right 09/2010   SFA/notes 11/25/2010 (01/22/2013)  . ANGIOPLASTY / STENTING FEMORAL Left 11/2010   SFA/notes 11/25/2010 (01/22/2013)  . ANGIOPLASTY / STENTING ILIAC     [name] 11/25/2010 (01/22/2013)  . BASCILIC VEIN TRANSPOSITION Left 08/23/2017   Procedure: FIRST STAGE BASILIC VEIN TRANSPOSITION LEFT UPPER EXTREMITY;  Surgeon: [name], MD;  Location: [facility];  Service: Vascular;  Laterality: Left;  . BASCILIC VEIN TRANSPOSITION Left 10/23/2017   Procedure: SECOND STAGE BASILIC VEIN TRANSPOSITION LEFT ARM;  Surgeon: [name], MD;  Location: [facility];  Service: Vascular;  Laterality: Left;  . CATARACT EXTRACTION W/ INTRAOCULAR LENS  IMPLANT, BILATERAL Bilateral ?2011  . CHOLECYSTECTOMY  1993  . CORONARY ARTERY BYPASS GRAFT  10/1994   "CABG X3"  . EP IMPLANTABLE DEVICE N/A 09/11/2015   Procedure: Loop Recorder Insertion;  Surgeon: Thompson Grayer, MD;  Location: [facility];  Service: Cardiovascular;  Laterality: N/A;  . HEEL SPUR EXCISION Bilateral 1970's  . LOWER EXTREMITY ANGIOGRAM N/A 01/22/2013   Procedure: LOWER EXTREMITY ANGIOGRAM;  Surgeon: [name], MD;  Location: [facility];  Service: Cardiovascular;  Laterality: N/A;  . SHOULDER OPEN ROTATOR CUFF REPAIR Bilateral 1990's   "2 times on 1 side; once on the other"  . TRANSLUMINAL ATHERECTOMY FEMORAL ARTERY Right 01/22/2013   & balloon  . TUBAL LIGATION  1980's  . VIDEO ASSISTED THORACOSCOPY (VATS)/EMPYEMA Left 11/29/2016   Procedure: VIDEO ASSISTED THORACOSCOPY (VATS)/EMPYEMA;  Surgeon: Prescott Gum, Collier Salina, MD;  Location: [facility];  Service: Thoracic;  Laterality: Left;  VIDEO ASSISTED THORACOSCOPY (VATS)/EMPYEMA    Family Hx:  Family History  Problem Relation Age of Onset   . Heart disease Mother   . Heart attack Mother   . Hypertension Mother   . Kidney disease Mother   . Pneumonia Father   . Heart attack Father   . Hyperlipidemia Father   . Diabetes Sister   . Breast cancer Sister   . Arthritis Brother   . Diabetes Brother   . Heart attack Sister   . Obesity Sister   . Hypertension Sister   . Heart attack Sister   . Cancer Sister        breast  . Hyperlipidemia Sister   . Hypertension Sister   . Hyperlipidemia Sister   . Breast cancer Maternal Grandmother     Social History:  reports that she has never smoked. She has never used smokeless tobacco. She reports that she does not drink alcohol or use drugs.  Allergies:  Allergies  Allergen Reactions  . Other Other (See Comments)    Darvocet-rash   . Amoxicillin Rash    Has patient had a PCN reaction causing immediate rash, facial/tongue/throat swelling, SOB or lightheadedness with hypotension: No Has patient had a PCN reaction causing severe rash involving mucus membranes or skin necrosis: No Has patient had a PCN reaction that required hospitalization: No Has patient had a PCN reaction occurring within the last 10 years: No If all of the above answers are "NO", then may proceed with Cephalosporin use.   . Codeine Phosphate Rash  . Penicillins Rash    Has patient had a PCN reaction causing immediate rash, facial/tongue/throat swelling, SOB or lightheadedness with hypotension: No Has patient had a PCN reaction causing severe rash involving mucus membranes or skin necrosis: No Has patient had a PCN reaction that required hospitalization: No Has patient had a PCN reaction occurring within the last 10 years: No If all of the above answers are "NO", then may proceed with Cephalosporin use.      Medications: Prior to Admission medications   Medication Sig Start Date End Date Taking? Authorizing Provider  allopurinol (ZYLOPRIM) 100 MG tablet TAKE 1 TABLET(100 MG) BY MOUTH DAILY Patient  taking differently: Take 100 mg by mouth daily.  10/17/17  Yes Hensel, Jamal Collin, MD  Ascorbic Acid (VITAMIN C) 100 MG tablet Take 100 mg by mouth daily.   Yes [provider]  carboxymethylcellulose (REFRESH PLUS) 0.5 % SOLN Place 1 drop into both eyes 3 (three) times daily as needed (dry eyes).    Yes [provider]  ferrous sulfate 325 (65 FE) MG tablet Take 1 tablet (325 mg total) by mouth daily with breakfast. 05/23/13  Yes Hensel, Jamal Collin, MD  GNP GARLIC EXTRACT PO Take 1 tablet by mouth daily.    Yes [provider]  HUMALOG KWIKPEN 100 UNIT/ML KiwkPen Inject 5 Units as directed 3 (three) times daily after meals.  11/03/16  Yes [provider]  hydrALAZINE (APRESOLINE) 50  MG tablet TAKE 1 TABLET BY MOUTH THREE TIMES DAILY Patient taking differently: Take 50 mg by mouth 3 (three) times daily.  10/18/17  Yes [name], MD  LANTUS SOLOSTAR 100 UNIT/ML Solostar Pen ADMINISTER 35 UNITS UNDER THE SKIN DAILY Patient taking differently: Inject 16 Units into the skin at bedtime.  10/02/17  Yes Hensel, Jamal Collin, MD  Melatonin 10 MG CAPS Take 10 mg by mouth at bedtime as needed (sleep).    Yes [provider]  metoprolol tartrate (LOPRESSOR) 25 MG tablet TAKE 1 TABLET BY MOUTH TWICE DAILY Patient taking differently: Take 25 mg by mouth 2 (two) times daily.  12/07/16  Yes Hensel, Jamal Collin, MD  Multiple Vitamins-Minerals (EYE VITAMINS PO) Take 1 capsule by mouth daily. Taking one daily from her opthamologist (Dr. Herbert Deaner)    Yes [provider]  Multiple Vitamins-Minerals (MULTIVITAMINS THER. W/MINERALS) TABS Take 1 tablet by mouth daily.     Yes [provider]  omega-3 acid ethyl esters (LOVAZA) 1 g capsule Take 2 g by mouth 2 (two) times daily.    Yes [provider]  ondansetron (ZOFRAN) 4 MG tablet Take 1 tablet (4 mg total) by mouth every 8 (eight) hours as needed for nausea or vomiting. 10/11/17  Yes Carlyle Basques, MD   pantoprazole (PROTONIX) 40 MG tablet TAKE 1 TABLET BY MOUTH DAILY Patient taking differently: Take 40 mg by mouth at bedtime.  05/29/17  Yes [name], MD  PARoxetine (PAXIL) 40 MG tablet Take 1 tablet (40 mg total) by mouth every morning. 10/18/17  Yes Hensel, Jamal Collin, MD  rosuvastatin (CRESTOR) 20 MG tablet Take 1 tablet (20 mg total) by mouth daily. Patient taking differently: Take 20 mg by mouth at bedtime.  10/18/17  Yes Hensel, Jamal Collin, MD  torsemide (DEMADEX) 100 MG tablet Take 1 tablet (100 mg total) by mouth daily. 02/16/17  Yes Hensel, Jamal Collin, MD  traMADol (ULTRAM) 50 MG tablet Take 1 tablet (50 mg total) by mouth every 6 (six) hours as needed (for pain.). Patient taking differently: Take 100 mg by mouth every 6 (six) hours as needed (for pain.).  10/23/17  Yes Ulyses Amor, PA-C  warfarin (COUMADIN) 5 MG tablet Take 1 tablet (5 mg total) by mouth daily. Patient taking differently: Take 5 mg by mouth See admin instructions. Goes to the clinic every Monday    Take 0.5 tablet (2.5 mg) by mouth on Sundays, Tuesdays, Wednesdays, Thursdays, Saturdays. Take 1 tablet (5 mg) by mouth on Mondays & Fridays. 09/19/17  Yes [name], MD  ACCU-CHEK AVIVA PLUS test strip TEST THREE TIMES A DAY 04/26/17   Hensel, Jamal Collin, MD  UNKNOWN TO PATIENT LOVENOX, pt unsure of dose    [provider]    I have reviewed the patient's current medications.  Labs:  Results for orders placed or performed during the hospital encounter of 10/27/17 (from the past 48 hour(s))  Lipase, blood     Status: None   Collection Time: 10/27/17 11:26 AM  Result Value Ref Range   Lipase 21 11 - 51 U/L    Comment: Performed at [facility], [place] [address], [place], [place] [address]  Comprehensive metabolic panel     Status: Abnormal   Collection Time: 10/27/17 11:26 AM  Result Value Ref Range   Sodium 138 135 - 145 mmol/L   Potassium 4.5 3.5 - 5.1 mmol/L   Chloride 101 98 - 111  mmol/L  CO2 20 (L) 22 - 32 mmol/L   Glucose, Bld 119 (H) 70 - 99 mg/dL   BUN 129 (H) 8 - 23 mg/dL   Creatinine, Ser 7.21 (H) 0.44 - 1.00 mg/dL   Calcium 9.1 8.9 - 10.3 mg/dL   Total Protein 5.2 (L) 6.5 - 8.1 g/dL   Albumin 2.9 (L) 3.5 - 5.0 g/dL   AST 33 15 - 41 U/L   ALT 20 0 - 44 U/L   Alkaline Phosphatase 67 38 - 126 U/L   Total Bilirubin 1.1 0.3 - 1.2 mg/dL   GFR calc non Af Amer 5 (L) >60 mL/min   GFR calc Af Amer 6 (L) >60 mL/min    Comment: (NOTE) The eGFR has been calculated using the CKD EPI equation. This calculation has not been validated in all clinical situations. eGFR's persistently <60 mL/min signify possible Chronic Kidney Disease.    Anion gap 17 (H) 5 - 15    Comment: Performed at [facility], [place] [address] [place], [place] [address]  CBC     Status: Abnormal   Collection Time: 10/27/17 11:26 AM  Result Value Ref Range   WBC 15.0 (H) 4.0 - 10.5 K/uL   RBC 3.28 (L) 3.87 - 5.11 MIL/uL   Hemoglobin 10.2 (L) 12.0 - 15.0 g/dL   HCT 32.0 (L) 36.0 - 46.0 %   MCV 97.6 78.0 - 100.0 fL   MCH 31.1 26.0 - 34.0 pg   MCHC 31.9 30.0 - 36.0 g/dL   RDW 18.0 (H) 11.5 - 15.5 %   Platelets 224 150 - 400 K/uL    Comment: Performed at [facility], [place] [address] [place], [place] [address]  Protime-INR     Status: Abnormal   Collection Time: 10/27/17 11:26 AM  Result Value Ref Range   Prothrombin Time 20.6 (H) 11.4 - 15.2 seconds   INR 1.78     Comment: Performed at [facility] [address] [place], [place] [address]  Brain natriuretic peptide     Status: Abnormal   Collection Time: 10/27/17 11:26 AM  Result Value Ref Range   B Natriuretic Peptide 1,450.3 (H) 0.0 - 100.0 pg/mL    Comment: Performed at [facility] [address] [place], [place] [address]  Blood culture (routine x 2)     Status: None (Preliminary result)   Collection Time: 10/27/17 12:00 PM  Result Value Ref Range   Specimen Description BLOOD RIGHT ANTECUBITAL     Special Requests      BOTTLES DRAWN AEROBIC AND ANAEROBIC Blood Culture results may not be optimal due to an excessive volume of blood received in culture bottles   Culture  Setup Time      ANAEROBIC BOTTLE ONLY GRAM NEGATIVE RODS Organism ID to follow    Culture      NO GROWTH < 12 HOURS Performed at [facility] [address], [place], [place] [address]    Report Status PENDING   Blood Culture ID Panel (Reflexed)     Status: Abnormal   Collection Time: 10/27/17 12:00 PM  Result Value Ref Range   Enterococcus species NOT DETECTED NOT DETECTED   Listeria monocytogenes NOT DETECTED NOT DETECTED   Staphylococcus species NOT DETECTED NOT DETECTED   Staphylococcus aureus (BCID) NOT DETECTED NOT DETECTED   Streptococcus species NOT DETECTED NOT DETECTED   Streptococcus agalactiae NOT DETECTED NOT DETECTED   Streptococcus pneumoniae NOT DETECTED NOT DETECTED  Streptococcus pyogenes NOT DETECTED NOT DETECTED   Acinetobacter baumannii NOT DETECTED NOT DETECTED   Enterobacteriaceae species DETECTED (A) NOT DETECTED    Comment: Enterobacteriaceae represent a large family of gram-negative bacteria, not a single organism. CRITICAL RESULT CALLED TO, READ BACK BY AND VERIFIED WITH: L. SEAY @ [phone] ON 10/28/17 BY ROBINSON Z.    Enterobacter cloacae complex DETECTED (A) NOT DETECTED    Comment: CRITICAL RESULT CALLED TO, READ BACK BY AND VERIFIED WITH: L. SEAY @ [phone] ON 10/28/17 BY ROBINSON Z.    Escherichia coli NOT DETECTED NOT DETECTED   Klebsiella oxytoca NOT DETECTED NOT DETECTED   Klebsiella pneumoniae NOT DETECTED NOT DETECTED   Proteus species NOT DETECTED NOT DETECTED   Serratia marcescens NOT DETECTED NOT DETECTED   Carbapenem resistance NOT DETECTED NOT DETECTED   Haemophilus influenzae NOT DETECTED NOT DETECTED   Neisseria meningitidis NOT DETECTED NOT DETECTED   Pseudomonas aeruginosa NOT DETECTED NOT DETECTED   Candida albicans NOT DETECTED NOT DETECTED   Candida  glabrata NOT DETECTED NOT DETECTED   Candida krusei NOT DETECTED NOT DETECTED   Candida parapsilosis NOT DETECTED NOT DETECTED   Candida tropicalis NOT DETECTED NOT DETECTED  I-stat troponin, ED     Status: Abnormal   Collection Time: 10/27/17 12:15 PM  Result Value Ref Range   Troponin i, poc 0.25 (HH) 0.00 - 0.08 ng/mL   Comment NOTIFIED PHYSICIAN    Comment 3            Comment: Due to the release kinetics of cTnI, a negative result within the first hours of the onset of symptoms does not rule out myocardial infarction with certainty. If myocardial infarction is still suspected, repeat the test at appropriate intervals.   I-Stat CG4 Lactic Acid, ED     Status: None   Collection Time: 10/27/17 12:17 PM  Result Value Ref Range   Lactic Acid, Venous 1.51 0.5 - 1.9 mmol/L  I-stat troponin, ED     Status: Abnormal   Collection Time: 10/27/17  2:43 PM  Result Value Ref Range   Troponin i, poc 0.22 (HH) 0.00 - 0.08 ng/mL   Comment NOTIFIED PHYSICIAN    Comment 3            Comment: Due to the release kinetics of cTnI, a negative result within the first hours of the onset of symptoms does not rule out myocardial infarction with certainty. If myocardial infarction is still suspected, repeat the test at appropriate intervals.   I-Stat venous blood gas, ED     Status: Abnormal   Collection Time: 10/27/17  2:44 PM  Result Value Ref Range   pH, Ven 7.359 7.250 - 7.430   pCO2, Ven 35.9 (L) 44.0 - 60.0 mmHg   pO2, Ven 35.0 32.0 - 45.0 mmHg   Bicarbonate 20.2 20.0 - 28.0 mmol/L   TCO2 21 (L) 22 - 32 mmol/L   O2 Saturation 65.0 %   Acid-base deficit 5.0 (H) 0.0 - 2.0 mmol/L   Patient temperature HIDE    Sample type VENOUS    Comment NOTIFIED PHYSICIAN   I-Stat CG4 Lactic Acid, ED     Status: None   Collection Time: 10/27/17  2:45 PM  Result Value Ref Range   Lactic Acid, Venous 1.12 0.5 - 1.9 mmol/L  Glucose, capillary     Status: Abnormal   Collection Time: 10/27/17  5:26 PM   Result Value Ref Range   Glucose-Capillary 109 (H) 70 - 99 mg/dL  Urinalysis, Routine w reflex microscopic     Status: Abnormal   Collection Time: 10/27/17  6:29 PM  Result Value Ref Range   Color, Urine YELLOW YELLOW   APPearance TURBID (A) CLEAR   Specific Gravity, Urine 1.011 1.005 - 1.030   pH 6.0 5.0 - 8.0   Glucose, UA NEGATIVE NEGATIVE mg/dL   Hgb urine dipstick LARGE (A) NEGATIVE   Bilirubin Urine NEGATIVE NEGATIVE   Ketones, ur 5 (A) NEGATIVE mg/dL   Protein, ur 100 (A) NEGATIVE mg/dL   Nitrite NEGATIVE NEGATIVE   Leukocytes, UA LARGE (A) NEGATIVE   RBC / HPF 21-50 0 - 5 RBC/hpf   WBC, UA >50 (H) 0 - 5 WBC/hpf   Bacteria, UA MANY (A) NONE SEEN   WBC Clumps PRESENT     Comment: Performed at [facility], [address] [place], [place] [address]  MRSA PCR Screening     Status: None   Collection Time: 10/27/17  6:29 PM  Result Value Ref Range   MRSA by PCR NEGATIVE NEGATIVE    Comment:        The GeneXpert MRSA Assay (FDA approved for NASAL specimens only), is one component of a comprehensive MRSA colonization surveillance program. It is not intended to diagnose MRSA infection nor to guide or monitor treatment for MRSA infections. Performed at [facility], [place] [address] [place], [place] [address]   Glucose, capillary     Status: Abnormal   Collection Time: 10/27/17  8:46 PM  Result Value Ref Range   Glucose-Capillary 135 (H) 70 - 99 mg/dL  Protime-INR     Status: Abnormal   Collection Time: 10/28/17  2:59 AM  Result Value Ref Range   Prothrombin Time 27.2 (H) 11.4 - 15.2 seconds   INR 2.54     Comment: Performed at [facility], [place] [address], [place], [place] [address]  CBC WITH DIFFERENTIAL     Status: Abnormal   Collection Time: 10/28/17  2:59 AM  Result Value Ref Range   WBC 15.4 (H) 4.0 - 10.5 K/uL   RBC 2.94 (L) 3.87 - 5.11 MIL/uL   Hemoglobin 9.2 (L) 12.0 - 15.0 g/dL   HCT 28.6 (L) 36.0 - 46.0 %   MCV 97.3 78.0 - 100.0 fL    MCH 31.3 26.0 - 34.0 pg   MCHC 32.2 30.0 - 36.0 g/dL   RDW 17.9 (H) 11.5 - 15.5 %   Platelets 214 150 - 400 K/uL   Neutrophils Relative % 85 %   Neutro Abs 13.2 (H) 1.7 - 7.7 K/uL   Lymphocytes Relative 10 %   Lymphs Abs 1.5 0.7 - 4.0 K/uL   Monocytes Relative 4 %   Monocytes Absolute 0.7 0.1 - 1.0 K/uL   Eosinophils Relative 0 %   Eosinophils Absolute 0.0 0.0 - 0.7 K/uL   Basophils Relative 0 %   Basophils Absolute 0.0 0.0 - 0.1 K/uL   Immature Granulocytes 1 %   Abs Immature Granulocytes 0.1 0.0 - 0.1 K/uL    Comment: Performed at [facility], [address] [place], [place] [address]  Renal function panel     Status: Abnormal   Collection Time: 10/28/17  2:59 AM  Result Value Ref Range   Sodium 136 135 - 145 mmol/L   Potassium 5.2 (H) 3.5 - 5.1 mmol/L   Chloride 99 98 - 111 mmol/L   CO2 21 (L) 22 - 32 mmol/L   Glucose, Bld  138 (H) 70 - 99 mg/dL   BUN 138 (H) 8 - 23 mg/dL   Creatinine, Ser 7.66 (H) 0.44 - 1.00 mg/dL   Calcium 8.7 (L) 8.9 - 10.3 mg/dL   Phosphorus 5.7 (H) 2.5 - 4.6 mg/dL   Albumin 2.5 (L) 3.5 - 5.0 g/dL   GFR calc non Af Amer 5 (L) >60 mL/min   GFR calc Af Amer 5 (L) >60 mL/min    Comment: (NOTE) The eGFR has been calculated using the CKD EPI equation. This calculation has not been validated in all clinical situations. eGFR's persistently <60 mL/min signify possible Chronic Kidney Disease.    Anion gap 16 (H) 5 - 15    Comment: Performed at [facility], [place] [address], [place], [place] [address]  Troponin I     Status: Abnormal   Collection Time: 10/28/17  2:59 AM  Result Value Ref Range   Troponin I 0.18 (HH) <0.03 ng/mL    Comment: CRITICAL RESULT CALLED TO, READ BACK BY AND VERIFIED WITH: MOORE,E RN 10/28/2017 0528 JORDANS Performed at [facility], [place] [address], [place], [place] [address]   Glucose, capillary     Status: None   Collection Time: 10/28/17  7:59 AM  Result Value Ref Range   Glucose-Capillary 98 70 - 99 mg/dL      ROS:  Pertinent items are noted in HPI.  Physical Exam: Vitals:   10/28/17 0408 10/28/17 0800  BP: 116/63 122/78  Pulse: (!) 102   Resp: 19 18  Temp: 98.5 F (36.9 C) 98.2 F (36.8 C)  SpO2: 96% 100%     General: elderly woman at 30 degrees in bed, appears comfortable HEENT: MMM Eyes: anicteric Neck: supple, JVD elevated Heart: RRR, no rub Lungs: dec BS to mid lung fields on right, dec in L base, no rales, no wheezes, normal WOB Abdomen: soft, nontender with light to moderate palpation, +BS Extremities: LUE with BB transposition scar, ecchymosis, 2+ edema; LLE trace edema, RLE 1+ edema Neuro: conversant, alert, no asterixis.   Assessment/Plan: 1.  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  She does seem to have some low grade uremic symptoms but no urgent indications for dialysis.  I think we can monitor for a few days while managing other issues, but she may end up needing to start dialysis this admission.  If she does, she will need a tunneled dialysis catheter.  She has mild hyperphosphatemia 5.7 - monitor for now. Corr Ca ok.   2.  Anasarca - she takes torsemide 100 daily at home.  Dose with lasix 80 IV x 2 doses today and monitor I/Os.   3.  Abdominal pain - unclear etiology, w/u per primary team.   4.   Anemia - Hb 9.2 was previously in the 10s. Monitor and may benefit from ESA administration while hospitalized.  Recent iron indices not available here.    5.  Pleural effusion - pulmonary to evaluate  6.  Diastolic heart failure - diuresing per above.   7.. Atrial fibrillation - on metoprolol, rate controlled; on coumadin dosing per pharmacy.  8.  Enterobacter bacteremia - cefepime per primary team  9.  HTN - on hydralazine 50 TID, MTP 25 BID  10.  DM - per primary   Jannifer Hick A 10/28/2017, 8:14 AM

## 2017-10-28 NOTE — Progress Notes (Signed)
ANTICOAGULATION CONSULT NOTE - Initial Consult  Pharmacy Consult for Warfarin Indication: atrial fibrillation  Allergies  Allergen Reactions  . Other Other (See Comments)    Darvocet-rash   . Amoxicillin Rash    Has patient had a PCN reaction causing immediate rash, facial/tongue/throat swelling, SOB or lightheadedness with hypotension: No Has patient had a PCN reaction causing severe rash involving mucus membranes or skin necrosis: No Has patient had a PCN reaction that required hospitalization: No Has patient had a PCN reaction occurring within the last 10 years: No If all of the above answers are "NO", then may proceed with Cephalosporin use.   . Codeine Phosphate Rash  . Penicillins Rash    Has patient had a PCN reaction causing immediate rash, facial/tongue/throat swelling, SOB or lightheadedness with hypotension: No Has patient had a PCN reaction causing severe rash involving mucus membranes or skin necrosis: No Has patient had a PCN reaction that required hospitalization: No Has patient had a PCN reaction occurring within the last 10 years: No If all of the above answers are "NO", then may proceed with Cephalosporin use.      Patient Measurements: Height: 5' 5.5" (166.4 cm) Weight: 165 lb 12.6 oz (75.2 kg) IBW/kg (Calculated) : 58.15  Vital Signs: Temp: 98.2 F (36.8 C) (10/05 0800) Temp Source: Oral (10/05 0800) BP: 122/78 (10/05 0800) Pulse Rate: 102 (10/05 0408)  Labs: Recent Labs    10/27/17 1126 10/28/17 0259  HGB 10.2* 9.2*  HCT 32.0* 28.6*  PLT 224 214  LABPROT 20.6* 27.2*  INR 1.78 2.54  CREATININE 7.21* 7.66*  TROPONINI  --  0.18*    Estimated Creatinine Clearance: 6.8 mL/min (A) (by C-G formula based on SCr of 7.66 mg/dL (H)).   Medical History: Past Medical History:  Diagnosis Date  . Anemia 08/29/2011  . Anxiety   . Arthritis    "legs" (01/22/2013)  . CAD (coronary artery disease)    s/p CABG in 1999  . Cataract   . Chronic kidney  disease    Sees Dr [name]  . CLL (chronic lymphoblastic leukemia)    Kidney  . Diastolic heart failure ([place])   . GERD (gastroesophageal reflux disease)   . Gout   . H/O hiatal hernia   . Heart murmur   . Hyperlipidemia   . Hypertension   . IDDM (insulin dependent diabetes mellitus) ([place])    Type II  . PAD (peripheral artery disease) ([place])   . Paroxysmal atrial fibrillation (H[facility])    a. identified on ILR as part of StrokeAF study  . Peripheral vascular disease ([place])   . Pneumonia 11/2016  . 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 Still has balance issues       Assessment: 72 yo female on chronic warfarin therapy for atrial fibrillation.  Admitted for abdominal pain thought to be associated with possible fluid overload/pleural effusion. Pharmacy consulted to dose warfarin as an inpatient. INR on admission 1.78  Home warfarin regimen : 2.5 mg by mouth on Sundays, Tuesdays, Wednesdays, Thursdays, Saturdays and 5 mg by mouth on Mondays & Fridays.  INR therapeutic at 2.54, Hgb 9.8. No bleeding noted in chart.   Goal of Therapy:  INR 2-3   Plan:  Warfarin 2.5mg  x1 Monitor INR daily and signs/symptoms of bleeding.   Harrietta Guardian, PharmD PGY1 Pharmacy Resident 10/28/2017    9:23 AM

## 2017-10-28 NOTE — Plan of Care (Signed)
Patient is alert and oriented, family at the bedside, on [facility] O2 therapy, Tylenol given for pain, CT performed, US performed, Ua sent Urine culture sent, Patient resting in bed at this time, cardiac monitoring at bedside, will continue to monitor  Problem: Education: Goal: Knowledge of General Education information will improve Description Including pain rating scale, medication(s)/side effects and non-pharmacologic comfort measures Outcome: Progressing   Problem: Health Behavior/Discharge Planning: Goal: Ability to manage health-related needs will improve Outcome: Progressing   Problem: Clinical Measurements: Goal: Ability to maintain clinical measurements within normal limits will improve Outcome: Progressing Goal: Will remain free from infection Outcome: Progressing Goal: Diagnostic test results will improve Outcome: Progressing Goal: Respiratory complications will improve Outcome: Progressing Goal: Cardiovascular complication will be avoided Outcome: Progressing   Problem: Activity: Goal: Risk for activity intolerance will decrease Outcome: Progressing   Problem: Nutrition: Goal: Adequate nutrition will be maintained Outcome: Progressing   Problem: Coping: Goal: Level of anxiety will decrease Outcome: Progressing   Problem: Elimination: Goal: Will not experience complications related to bowel motility Outcome: Progressing Goal: Will not experience complications related to urinary retention Outcome: Progressing   Problem: Pain Managment: Goal: General experience of comfort will improve Outcome: Progressing   Problem: Safety: Goal: Ability to remain free from injury will improve Outcome: Progressing   Problem: Skin Integrity: Goal: Risk for impaired skin integrity will decrease Outcome: Progressing   Problem: Spiritual Needs Goal: Ability to function at adequate level Outcome: Progressing

## 2017-10-28 NOTE — Progress Notes (Signed)
PHARMACY - PHYSICIAN COMMUNICATION CRITICAL VALUE ALERT - BLOOD CULTURE IDENTIFICATION (BCID)  Christine Santos is an 72 y.o. female who presented to [facility] on 10/27/2017 with a chief complaint of abdominal pain  Assessment:  3/4 BC Enterobacter cloacae, no resistance detected  Name of physician (or Provider) Contacted: IMTS  Current antibiotics: none  Changes to prescribed antibiotics recommended:  Cefepime 1gm IV x 1 then 500 mg IV q24 hours  Results for orders placed or performed during the hospital encounter of 10/27/17  Blood Culture ID Panel (Reflexed) (Collected: 10/27/2017 12:00 PM)  Result Value Ref Range   Enterococcus species NOT DETECTED NOT DETECTED   Listeria monocytogenes NOT DETECTED NOT DETECTED   Staphylococcus species NOT DETECTED NOT DETECTED   Staphylococcus aureus (BCID) NOT DETECTED NOT DETECTED   Streptococcus species NOT DETECTED NOT DETECTED   Streptococcus agalactiae NOT DETECTED NOT DETECTED   Streptococcus pneumoniae NOT DETECTED NOT DETECTED   Streptococcus pyogenes NOT DETECTED NOT DETECTED   Acinetobacter baumannii NOT DETECTED NOT DETECTED   Enterobacteriaceae species DETECTED (A) NOT DETECTED   Enterobacter cloacae complex DETECTED (A) NOT DETECTED   Escherichia coli NOT DETECTED NOT DETECTED   Klebsiella oxytoca NOT DETECTED NOT DETECTED   Klebsiella pneumoniae NOT DETECTED NOT DETECTED   Proteus species NOT DETECTED NOT DETECTED   Serratia marcescens NOT DETECTED NOT DETECTED   Carbapenem resistance NOT DETECTED NOT DETECTED   Haemophilus influenzae NOT DETECTED NOT DETECTED   Neisseria meningitidis NOT DETECTED NOT DETECTED   Pseudomonas aeruginosa NOT DETECTED NOT DETECTED   Candida albicans NOT DETECTED NOT DETECTED   Candida glabrata NOT DETECTED NOT DETECTED   Candida krusei NOT DETECTED NOT DETECTED   Candida parapsilosis NOT DETECTED NOT DETECTED   Candida tropicalis NOT DETECTED NOT DETECTED    Excell Seltzer Poteet 10/28/2017  3:58  AM

## 2017-10-28 NOTE — Care Management Note (Signed)
Case Management Note  Patient Details  Name: Christine Santos MRN: [id number] Date of Birth: September 07, 1945  Subjective/Objective:  From home , presents  with acute onset abdominal pain.  and large right pleural effusion, and free intraperitoneal fluid.              Action/Plan: NCM will follow for transition of care needs.   Expected Discharge Date:  10/29/17               Expected Discharge Plan:     In-House Referral:     Discharge planning Services  CM Consult  Post Acute Care Choice:    Choice offered to:     DME Arranged:    DME Agency:     HH Arranged:    HH Agency:     Status of Service:  In process, will continue to follow  If discussed at Long Length of Stay Meetings, dates discussed:    Additional Comments:  Zenon Mayo, RN 10/28/2017, 12:02 PM

## 2017-10-29 LAB — RENAL FUNCTION PANEL
ANION GAP: 13 (ref 5–15)
Albumin: 2.5 g/dL — ABNORMAL LOW (ref 3.5–5.0)
BUN: 149 mg/dL — ABNORMAL HIGH (ref 8–23)
CALCIUM: 8.5 mg/dL — AB (ref 8.9–10.3)
CO2: 22 mmol/L (ref 22–32)
CREATININE: 8.58 mg/dL — AB (ref 0.44–1.00)
Chloride: 102 mmol/L (ref 98–111)
GFR, EST AFRICAN AMERICAN: 5 mL/min — AB (ref >60)
GFR, EST NON AFRICAN AMERICAN: 4 mL/min — AB (ref >60)
Glucose, Bld: 151 mg/dL — ABNORMAL HIGH (ref 70–99)
Phosphorus: 5.9 mg/dL — ABNORMAL HIGH (ref 2.5–4.6)
Potassium: 4.8 mmol/L (ref 3.5–5.1)
SODIUM: 137 mmol/L (ref 135–145)

## 2017-10-29 LAB — CBC
HCT: 29.3 % — ABNORMAL LOW (ref 36.0–46.0)
HEMOGLOBIN: 9.5 g/dL — AB (ref 12.0–15.0)
MCH: 31.5 pg (ref 26.0–34.0)
MCHC: 32.4 g/dL (ref 30.0–36.0)
MCV: 97 fL (ref 78.0–100.0)
PLATELETS: 214 10*3/uL (ref 150–400)
RBC: 3.02 MIL/uL — ABNORMAL LOW (ref 3.87–5.11)
RDW: 18.2 % — ABNORMAL HIGH (ref 11.5–15.5)
WBC: 10.4 10*3/uL (ref 4.0–10.5)

## 2017-10-29 LAB — GLUCOSE, CAPILLARY
GLUCOSE-CAPILLARY: 124 mg/dL — AB (ref 70–99)
GLUCOSE-CAPILLARY: 126 mg/dL — AB (ref 70–99)
GLUCOSE-CAPILLARY: 161 mg/dL — AB (ref 70–99)
GLUCOSE-CAPILLARY: 190 mg/dL — AB (ref 70–99)
Glucose-Capillary: 163 mg/dL — ABNORMAL HIGH (ref 70–99)
Glucose-Capillary: 153 mg/dL — ABNORMAL HIGH (ref 70–99)

## 2017-10-29 LAB — PROTIME-INR
INR: 2.69
PROTHROMBIN TIME: 28.4 s — AB (ref 11.4–15.2)

## 2017-10-29 MED ORDER — WARFARIN SODIUM 2.5 MG PO TABS
2.5000 mg | ORAL_TABLET | Freq: Once | ORAL | Status: AC
  Administered 2017-10-29: 2.5 mg via ORAL
  Filled 2017-10-29: qty 1

## 2017-10-29 MED ORDER — FUROSEMIDE 10 MG/ML IJ SOLN
160.0000 mg | Freq: Two times a day (BID) | INTRAVENOUS | Status: DC
  Administered 2017-10-29 – 2017-11-01 (×7): 160 mg via INTRAVENOUS
  Filled 2017-10-29 (×2): qty 16
  Filled 2017-10-29: qty 10
  Filled 2017-10-29: qty 16
  Filled 2017-10-29: qty 10
  Filled 2017-10-29 (×3): qty 16

## 2017-10-29 MED ORDER — DIPHENHYDRAMINE HCL 50 MG/ML IJ SOLN
25.0000 mg | Freq: Once | INTRAMUSCULAR | Status: AC
  Administered 2017-10-29: 25 mg via INTRAVENOUS
  Filled 2017-10-29: qty 1

## 2017-10-29 NOTE — Progress Notes (Addendum)
ANTICOAGULATION CONSULT NOTE - Initial Consult  Pharmacy Consult for Warfarin Indication: atrial fibrillation  Allergies  Allergen Reactions  . Other Other (See Comments)    Darvocet-rash   . Amoxicillin Rash    Has patient had a PCN reaction causing immediate rash, facial/tongue/throat swelling, SOB or lightheadedness with hypotension: No Has patient had a PCN reaction causing severe rash involving mucus membranes or skin necrosis: No Has patient had a PCN reaction that required hospitalization: No Has patient had a PCN reaction occurring within the last 10 years: No If all of the above answers are "NO", then may proceed with Cephalosporin use.   . Codeine Phosphate Rash  . Penicillins Rash    Has patient had a PCN reaction causing immediate rash, facial/tongue/throat swelling, SOB or lightheadedness with hypotension: No Has patient had a PCN reaction causing severe rash involving mucus membranes or skin necrosis: No Has patient had a PCN reaction that required hospitalization: No Has patient had a PCN reaction occurring within the last 10 years: No If all of the above answers are "NO", then may proceed with Cephalosporin use.      Patient Measurements: Height: 5' 5.5" (166.4 cm) Weight: 167 lb 12.3 oz (76.1 kg) IBW/kg (Calculated) : 58.15  Vital Signs: Temp: 97.8 F (36.6 C) (10/06 0437) Temp Source: Oral (10/06 0437) BP: 123/84 (10/06 0437) Pulse Rate: 41 (10/06 0437)  Labs: Recent Labs    10/27/17 1126 10/28/17 0259 10/29/17 0322  HGB 10.2* 9.2* 9.5*  HCT 32.0* 28.6* 29.3*  PLT 224 214 214  LABPROT 20.6* 27.2* 28.4*  INR 1.78 2.54 2.69  CREATININE 7.21* 7.66* 8.58*  TROPONINI  --  0.18*  --     Estimated Creatinine Clearance: 6.1 mL/min (A) (by C-G formula based on SCr of 8.58 mg/dL (H)).   Medical History: Past Medical History:  Diagnosis Date  . Anemia 08/29/2011  . Anxiety   . Arthritis    "legs" (01/22/2013)  . CAD (coronary artery disease)    s/p CABG in 1999  . Cataract   . Chronic kidney disease    Sees Dr [name]  . CLL (chronic lymphoblastic leukemia)    Kidney  . Diastolic heart failure ([place])   . GERD (gastroesophageal reflux disease)   . Gout   . H/O hiatal hernia   . Heart murmur   . Hyperlipidemia   . Hypertension   . IDDM (insulin dependent diabetes mellitus) ([place])    Type II  . PAD (peripheral artery disease) ([place])   . Paroxysmal atrial fibrillation (HCC)    a. identified on ILR as part of StrokeAF study  . Peripheral vascular disease ([place])   . Pneumonia 11/2016  . 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 Still has balance issues    Medications: Allopurinol 100mg  daily (started 10/5) Paroxetine 40mg  daily (home med)  Assessment: 72 yo female on chronic warfarin therapy for atrial fibrillation.  Admitted for abdominal pain thought to be associated with possible fluid overload/pleural effusion. Pharmacy consulted to dose warfarin as an inpatient. INR on admission 1.78  Home warfarin regimen : 2.5 mg by mouth on Sundays, Tuesdays, Wednesdays, Thursdays, Saturdays and 5 mg by mouth on Mondays & Fridays.  INR therapeutic at 2.69, Hgb 9.5. No bleeding noted in chart.  Goal of Therapy:  INR 2-3   Plan:  Warfarin 2.5mg  x1 Monitor INR daily and signs/symptoms of bleeding.   Harrietta Guardian, PharmD PGY1 Pharmacy Resident 10/29/2017  8:23 AM

## 2017-10-29 NOTE — Progress Notes (Signed)
Pharmacist Heart Failure Core Measure Documentation  Assessment: Christine Santos has an EF documented as 55-60% on 10/2015 by ECHO.  Rationale: Heart failure patients with left ventricular systolic dysfunction (LVSD) and an EF < 40% should be prescribed an angiotensin converting enzyme inhibitor (ACEI) or angiotensin receptor blocker (ARB) at discharge unless a contraindication is documented in the medical record.  This patient is not currently on an ACEI or ARB for HF.  This note is being placed in the record in order to provide documentation that a contraindication to the use of these agents is present for this encounter.  ACE Inhibitor or Angiotensin Receptor Blocker is contraindicated (specify all that apply)  []   ACEI allergy AND ARB allergy []   Angioedema []   Moderate or severe aortic stenosis []   Hyperkalemia []   Hypotension []   Renal artery stenosis [x]   Worsening renal function, preexisting renal disease or dysfunction   Jackson Latino, PharmD PGY1 Pharmacy Resident Phone [phone] 10/29/2017     12:48 PM

## 2017-10-29 NOTE — Progress Notes (Signed)
Family medicine and progress update  Called to bedside for increasing squeakiness of voice.  Patient's husband has noticed that patient's voice has become increasingly high-pitched since last night.  She is also had increasing "pressure" in her throat when she tries to swallow since this time.  Upon chart review patient does have a documented allergy to penicillin.  It is a low severity rash.  Discussed with pharmacy that limit cross tolerance and limited allergic reaction could possibly cause more indolent anaphylaxis type symptoms.  Will give patient 25 mg IV Benadryl.  As her cefepime is dosed every 24 hours we will hold for now and reevaluate in morning.  If sensitivities do not result in the meantime, can switch to carboplatinum for Enterobacter cloaca coverage.  Discussed alarm signs with patient including but not limited to increasing throat swelling, trouble breathing, rash, tongue swelling.  Patient voiced understanding and will let nurse know if has any symptoms.  Will follow closely throughout the night.  Guadalupe Dawn MD PGY-2 Family Medicine Resident

## 2017-10-29 NOTE — Progress Notes (Signed)
Family Medicine Teaching Service Daily Progress Note Intern Pager: [phone]  Patient name: Christine Santos Medical record number: [id number] Date of birth: 12-02-1945 Age: 72 y.o. Gender: female  Primary Care Provider: [name], MD Consultants: nephrology Code Status: full  Pt Overview and Major Events to Date:  10/4 admitted with abdominal pain  Assessment and Plan: Christine Santos is a 72 y.o. female who presented with abdominal pain found to have enterobacter bacteremia. 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  Enterobacter bacteremia 2 out of 4 viles growing out Enterobacter cloacae.  Unknown source.  Cefepime started 10/5.  Afebrile since admission, WBC improved from 15.4->10.4.  Sensitivities still pending.  Urine culture with 100 K CFU of gram-negative rods.  Speciation still pending.  Bacteremia likely from UTI.  Continue cefepime, will likely switch to oral once sensitivity results. - continue cefepime (10/5-) - follow BCx for sensitivities  - Urine culture speciation, sensitivities - monitor wbc -Continue home tramadol - prn zofran  Acute hypoxic respiratory failure with B/L pleural effusions Gradually improving.  Stop O2 this a.m., with no desats while in room.  Significantly improved with diuresis. CT chest from 10 5 showing layering right pleural effusion.  Urine output has slowed significantly, with only 50 mL charted last 12 hours.  Weight increased from 165 to 167 pounds, up from 161 admission.  On torsemide 100 mg daily at home, receiving Lasix 80 mg twice daily IV while inpatient.  Patient's diuresis is complicated by AKI on CKD.  Given patient's improvement, will hold off on pump consult for diagnostic thoracentesis, but will reconsider if required O2 again. -Appreciate nephrology recs -Could consider increasing Lasix to 160 mg twice daily IV -Strict I's and  O's -Daily weights -No indication for pulm consult at this time   Elevated Cr in CKD 5.  Nephrology following appreciate recs.  K4.8 this a.m. BUN 149, creatinine 8.6.  Shortness of breath resolving as above.  No indication for urgent dialysis but will likely require near future.  Status post basilic vein transposition of left upper extremity on 10/23/2017 -Nephrology consulted appreciate recs -Daily renal function panel  Elevated trop  Initially had elevated i-STAT troponins greater than 0.2. Recheck in am of 10/5 0.18.  Given patient's CKD stage V, and downtrending no indication for further work-up.   Atrial fibrillation. Stable.  - continue home metoprolol  - continue warfarin per pharm  Hypertension, stable. Normotensive this am BP 123/61 -Continue home hydralazine 50 mg 3 times daily and lopressor\ 25 mg twice daily -monitor vitals  Type 2 diabetes, stable. Last A1c 6.2 on 12/13/2017.   Lantus 16 units plus sliding scale at home. CBG this am in 120s x2. -A1c pending -Lantus 16 units -sSSI - monitor CBGs  Hyperlipidemia, stable. -Continue home rosuvastatin 20 mg  Anxiety/depression, stable. - continue home paxil 40 mg.  Pulmonary nodule. Incidentally found on CT. L lower lobe 58mm nodule, increased from 77mm in 2015 with likely increased size from adjacent atelectasis so thought to stable and benign. - follow up outpatient    FEN/GI: renal diet, protonix PPx: warfarin  Disposition: pending medical improvement  Subjective:  Doing much better this a.m., only on 1 L.  This was her to 0 with no resulting shortness of breath.  Eating well with no complaints  Objective: Temp:  [97.7 F (36.5 C)-98.1 F (36.7 C)] 97.8  F (36.6 C) (10/06 0437) Pulse Rate:  [41-106] 41 (10/06 0437) Resp:  [15-18] 18 (10/06 0437) BP: (117-144)/(61-84) 123/61 (10/06 0908) SpO2:  [94 %-98 %] 98 % (10/06 0437) Weight:  [76.1 kg] 76.1 kg (10/06 0437) Physical Exam: General: Elderly  Caucasian female, sitting up in bed, no acute distress Cardiovascular: Regular rate and rhythm, no MRG Respiratory: Improved breath sounds, very mild crackles in lower lung bases. normal work of breathing, no accessory muscle use Abdomen: Abdominal pain resolved.  Soft, nontender, nondistended Extremities: 1+ pitting edema in LE. L UE with bruising around surgical incision that is clean and dry without erythema.   Laboratory: Recent Labs  Lab 10/27/17 1126 10/28/17 0259 10/29/17 0322  WBC 15.0* 15.4* 10.4  HGB 10.2* 9.2* 9.5*  HCT 32.0* 28.6* 29.3*  PLT 224 214 214   Recent Labs  Lab 10/27/17 1126 10/28/17 0259 10/29/17 0322  NA 138 136 137  K 4.5 5.2* 4.8  CL 101 99 102  CO2 20* 21* 22  BUN 129* 138* 149*  CREATININE 7.21* 7.66* 8.58*  CALCIUM 9.1 8.7* 8.5*  PROT 5.2*  --   --   BILITOT 1.1  --   --   ALKPHOS 67  --   --   ALT 20  --   --   AST 33  --   --   GLUCOSE 119* 138* 151*    Troponin I: 0.18  Troponin (Point of Care Test) Recent Labs    10/27/17 1443  TROPIPOC 0.22*    Imaging/Diagnostic Tests: No results found.  Guadalupe Dawn, MD 10/29/2017, 10:04 AM PGY-2, [facility] Intern pager: [phone], text pages welcome

## 2017-10-29 NOTE — Progress Notes (Signed)
[facility] Progress Note   Subjective:   Feels OK today - abd pain improved.  Ate breakfast ok.    Objective Vitals:   10/28/17 2222 10/29/17 0010 10/29/17 0437 10/29/17 0908  BP: (!) 144/80  123/84 123/61  Pulse: 97 86 (!) 41   Resp:  18 18   Temp:  98 F (36.7 C) 97.8 F (36.6 C)   TempSrc:   Oral   SpO2:  98% 98%   Weight:   76.1 kg   Height:       Physical Exam General: lying flat in bed, comfortable, nontoxic Heart: irreg irreg, syst murmur, no rub Lungs: rales on left, dec BS right, normal WOB Abdomen: soft nontender Extremities: diffuse edema Dialysis Access: LUE AVF post op ecchymosis and dressing  Additional Objective Labs: Basic Metabolic Panel: Recent Labs  Lab 10/27/17 1126 10/28/17 0259 10/29/17 0322  NA 138 136 137  K 4.5 5.2* 4.8  CL 101 99 102  CO2 20* 21* 22  GLUCOSE 119* 138* 151*  BUN 129* 138* 149*  CREATININE 7.21* 7.66* 8.58*  CALCIUM 9.1 8.7* 8.5*  PHOS  --  5.7* 5.9*   Liver Function Tests: Recent Labs  Lab 10/27/17 1126 10/28/17 0259 10/29/17 0322  AST 33  --   --   ALT 20  --   --   ALKPHOS 67  --   --   BILITOT 1.1  --   --   PROT 5.2*  --   --   ALBUMIN 2.9* 2.5* 2.5*   Recent Labs  Lab 10/27/17 1126  LIPASE 21   CBC: Recent Labs  Lab 10/27/17 1126 10/28/17 0259 10/29/17 0322  WBC 15.0* 15.4* 10.4  NEUTROABS  --  13.2*  --   HGB 10.2* 9.2* 9.5*  HCT 32.0* 28.6* 29.3*  MCV 97.6 97.3 97.0  PLT 224 214 214   Blood Culture    Component Value Date/Time   SDES URINE, RANDOM 10/27/2017 1829   SPECREQUEST  10/27/2017 1829    NONE Performed at [facility], [place] [address] [place], [place] [address]    CULT >=100,000 COLONIES/mL GRAM NEGATIVE RODS (A) 10/27/2017 1829   REPTSTATUS PENDING 10/27/2017 1829    Cardiac Enzymes: Recent Labs  Lab 10/28/17 0259  TROPONINI 0.18*   CBG: Recent Labs  Lab 10/28/17 1218 10/28/17 1649 10/29/17 0015 10/29/17 0636 10/29/17 0754  GLUCAP  118* 135* 190* 124* 126*   Iron Studies: No results for input(s): IRON, TIBC, TRANSFERRIN, FERRITIN in the last 72 hours. @lablastinr3 @ Studies/Results: Ct Abdomen Pelvis Wo Contrast  Result Date: 10/27/2017 CLINICAL DATA:  Right lower quadrant abdominal pain. Diarrhea. Vomiting. Dialysis patient. EXAM: CT ABDOMEN AND PELVIS WITHOUT CONTRAST TECHNIQUE: Multidetector CT imaging of the abdomen and pelvis was performed following the standard protocol without IV contrast. COMPARISON:  02/28/2017 CT pelvis FINDINGS: Lower chest: Large right effusion layering dependently with dependent pulmonary atelectasis. Tiny left effusion with mild left base atelectasis. Cardiomegaly. Hepatobiliary: Liver parenchyma appears normal. Previous cholecystectomy. Arterial calcification. Pancreas: Normal Spleen: Normal Adrenals/Urinary Tract: Adrenal glands are normal. Renal parenchyma shows mild generalized volume loss. There is a 1.5 cm hyperdense cyst of the lateral left kidney. There is mild fullness of the right renal collecting system and ureter. I do not see evidence of stone disease. Bladder appears normal. Stomach/Bowel: No proximal bowel abnormality is seen. There is sigmoid diverticulosis. Vascular/Lymphatic: Aortic atherosclerosis but no aneurysm. Widespread vascular calcification. Reproductive: Uterus appears normal. Chronic right-sided ovarian cysts is  evident as was seen on the previous CT. This is surrounded by fluid in the right lower quadrant and free fluid in the pelvis. I cannot rule out the possibility of inflammatory disease/abscess in addition. Other: Small amount of free fluid around the liver. Musculoskeletal: Chronic curvature in degenerative change of the spine. There is a ventral hernia just to the left of the umbilicus containing only fat. No herniated bowel. IMPRESSION: Large right pleural effusion with dependent pulmonary atelectasis. Cardiomegaly. Free intraperitoneal fluid. This is most prominent in  the pelvis. There is a right ovarian cyst as was seen on the previous study, surrounded by the fluid. I cannot rule out the possibility of inflammatory disease in the pelvis. I cannot identify any acute bowel pathology. Widespread and extensive vascular calcification. Fullness of the right renal collecting system and proximal ureter, possibly secondary to the right-sided pelvic process seen above. Ventral hernia containing only fat. Because of the indeterminate findings, one might consider a pelvic ultrasound and/or abdominal CT with oral and intravenous contrast if possible. Electronically Signed   By: Nelson Chimes M.D.   On: 10/27/2017 13:22   Dg Chest 2 View  Result Date: 10/27/2017 CLINICAL DATA:  Hypoxia.  Tachycardia. EXAM: CHEST - 2 VIEW COMPARISON:  04/10/2017. FINDINGS: Prior CABG. Cardiomegaly with diffuse bilateral from interstitial prominence and bilateral pleural effusions. Findings consistent with CHF. Gastric distention noted. IMPRESSION: 1. Prior CABG. Cardiomegaly with diffuse bilateral from interstitial prominence and bilateral pleural effusions consistent CHF. 2.  Gastric distention. Electronically Signed   By: Marcello Moores  Register   On: 10/27/2017 12:32   Ct Chest Wo Contrast  Result Date: 10/28/2017 CLINICAL DATA:  Pleural effusion. EXAM: CT CHEST WITHOUT CONTRAST TECHNIQUE: Multidetector CT imaging of the chest was performed following the standard protocol without IV contrast. COMPARISON:  Chest radiograph earlier this day. Lung bases from abdominal CT earlier this day. Chest CT 11/26/2016 FINDINGS: Cardiovascular: Post CABG. Aortic atherosclerosis. 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 Multi chamber cardiomegaly. Calcification of the native coronary arteries. No pericardial effusion. Mediastinum/Nodes: Small upper mediastinal lymph nodes not enlarged by size criteria. Hilar evaluation limited by absence of IV contrast. The esophagus is nondistended.  Heterogeneous thyroid gland without dominant nodule. Lungs/Pleura: Large layering right pleural effusion without significant loculation. Near complete compressive atelectasis of the right lower lobe. Lesser compressive atelectasis of the right upper lobe. Mild fluid tracking in the fissures and adjacent atelectasis. Small right pleural effusion with fluid tracking in the fissures. Minimal septal thickening in the left lower lobe with patchy ground-glass opacity. 7 mm perifissural nodule in the left lower lobe image 72 series 4, previously obscured. Trachea and mainstem bronchi are patent. Upper Abdomen: Vascular calcifications.  No acute findings. Musculoskeletal: Multilevel degenerative change in the spine. There are no acute or suspicious osseous abnormalities. IMPRESSION: 1. Large layering right pleural effusion with compressive atelectasis. 2. Small left pleural effusion. Minimal septal thickening and ground-glass opacity in the left lower lobe is likely pulmonary edema. 3. Left lower lobe pulmonary nodule measures 7 mm. This nodule measured 5 mm on PET CT 01/10/2014. Lack of significant or interval growth favors a benign etiology, likely post infective or inflammatory. Apparent 2 mm increase in size is likely due to adjacent atelectasis. 4. Multi chamber cardiomegaly. Coronary artery calcifications, post CABG. Aortic Atherosclerosis (ICD10-I70.0). Electronically Signed   By: Keith Rake M.D.   On: 10/28/2017 00:24   US Pelvis (transabdominal Only)  Result Date: 10/28/2017 CLINICAL DATA:  Pelvic pain. EXAM:  TRANSABDOMINAL ULTRASOUND OF PELVIS TECHNIQUE: Transabdominal ultrasound examination of the pelvis was performed including evaluation of the uterus, ovaries, adnexal regions, and pelvic cul-de-sac. COMPARISON:  CT scan February 28, 2017 October 27, 2017 FINDINGS: Uterus Measurements: 5.1 x 2.0 x 3.3 cm. No fibroids or other mass visualized. Endometrium Thickness: 2.8 mm.  No focal abnormality  visualized. Right ovary Measurements: 3.1 x 2.4 x 1.8 cm. The right ovarian cyst seen on CT imaging is not appreciated on this study. Left ovary Measurements: 3.4 x 2.8 x 2.9 cm. Normal appearance/no adnexal mass. Other findings: A small amount of fluid in the pelvis is nonspecific. IMPRESSION: 1. The known right ovarian cyst seen on previous CT imaging is not appreciated on this ultrasound. 2. Free fluid in the pelvis is nonspecific. 3. No other abnormalities noted. Electronically Signed   By: Dorise Bullion III M.D   On: 10/28/2017 00:04   Medications: . sodium chloride     . allopurinol  100 mg Oral Daily  . ceFEPime (MAXIPIME) IV  500 mg Intravenous Q24H  . furosemide  80 mg Intravenous BID  . hydrALAZINE  50 mg Oral TID  . insulin aspart  0-9 Units Subcutaneous TID WC  . insulin glargine  16 Units Subcutaneous QHS  . metoprolol tartrate  25 mg Oral BID  . pantoprazole  40 mg Oral QHS  . PARoxetine  40 mg Oral BH-q7a  . rosuvastatin  20 mg Oral QHS  . warfarin  2.5 mg Oral ONCE-1800  . Warfarin - Pharmacist Dosing Inpatient   Does not apply q1800   Assessment/Plan: 1.  CKD 5 - Advanced CKD now with worsening in setting of sepsis.  Cr 4.65 last week and now in the 7s in the setting of acute illness.  BUN > 100.  She does seem to have some low grade uremic symptoms but no urgent indications for dialysis.  I think we can monitor for a few days while managing other issues, but she likely will end up needing to start dialysis this admission.  She will need a tunneled dialysis catheter if she starts and given bacteremia I have repeated blood cultures today in prep.  She has mild hyperphosphatemia 5.9 - monitor for now. Corr Ca ok.   2.  Anasarca - she takes torsemide 100 daily at home.  Did not respond to lasix 80 IV yesterday, increase to 160 IV BID  today.    3.  Abdominal pain - unclear etiology, w/u per primary team.   4.   Anemia - Hb 9.5. Monitor and may benefit from ESA  administration while hospitalized if trending down.  Recent iron indices not available here.  Hold iron administration in setting of acute bacterial infection.  5.  Pleural effusion - pulmonary to evaluate  6.  Diastolic heart failure - diuresing per above.   7.. Atrial fibrillation - on metoprolol, rate controlled; on coumadin dosing per pharmacy.  8.  Enterobacter bacteremia - suspected urinary source. cefepime per primary team  9.  HTN - normotensive. on hydralazine 50 TID, MTP 25 BID  10.  DM - per primary   Jannifer Hick MD 10/29/2017, 10:08 AM  [facility] Pager: [phone]

## 2017-10-30 LAB — CBC WITH DIFFERENTIAL/PLATELET
ABS IMMATURE GRANULOCYTES: 0.1 10*3/uL (ref 0.0–0.1)
BASOS ABS: 0 10*3/uL (ref 0.0–0.1)
BASOS PCT: 0 %
Eosinophils Absolute: 0.3 10*3/uL (ref 0.0–0.7)
Eosinophils Relative: 3 %
HCT: 29.7 % — ABNORMAL LOW (ref 36.0–46.0)
Hemoglobin: 9.6 g/dL — ABNORMAL LOW (ref 12.0–15.0)
IMMATURE GRANULOCYTES: 0 %
Lymphocytes Relative: 19 %
Lymphs Abs: 2.2 10*3/uL (ref 0.7–4.0)
MCH: 31 pg (ref 26.0–34.0)
MCHC: 32.3 g/dL (ref 30.0–36.0)
MCV: 95.8 fL (ref 78.0–100.0)
MONO ABS: 0.9 10*3/uL (ref 0.1–1.0)
Monocytes Relative: 7 %
NEUTROS ABS: 8.2 10*3/uL — AB (ref 1.7–7.7)
NEUTROS PCT: 71 %
PLATELETS: 218 10*3/uL (ref 150–400)
RBC: 3.1 MIL/uL — AB (ref 3.87–5.11)
RDW: 17.6 % — ABNORMAL HIGH (ref 11.5–15.5)
WBC: 11.6 10*3/uL — AB (ref 4.0–10.5)

## 2017-10-30 LAB — PROTIME-INR
INR: 3.02
Prothrombin Time: 31 seconds — ABNORMAL HIGH (ref 11.4–15.2)

## 2017-10-30 LAB — RENAL FUNCTION PANEL
ALBUMIN: 2.5 g/dL — AB (ref 3.5–5.0)
ANION GAP: 14 (ref 5–15)
BUN: 158 mg/dL — ABNORMAL HIGH (ref 8–23)
CALCIUM: 8.1 mg/dL — AB (ref 8.9–10.3)
CO2: 20 mmol/L — ABNORMAL LOW (ref 22–32)
Chloride: 99 mmol/L (ref 98–111)
Creatinine, Ser: 8.76 mg/dL — ABNORMAL HIGH (ref 0.44–1.00)
GFR, EST AFRICAN AMERICAN: 5 mL/min — AB (ref >60)
GFR, EST NON AFRICAN AMERICAN: 4 mL/min — AB (ref >60)
Glucose, Bld: 168 mg/dL — ABNORMAL HIGH (ref 70–99)
PHOSPHORUS: 5.5 mg/dL — AB (ref 2.5–4.6)
POTASSIUM: 4.9 mmol/L (ref 3.5–5.1)
SODIUM: 133 mmol/L — AB (ref 135–145)

## 2017-10-30 LAB — CULTURE, BLOOD (ROUTINE X 2): SPECIAL REQUESTS: ADEQUATE

## 2017-10-30 LAB — GLUCOSE, CAPILLARY
GLUCOSE-CAPILLARY: 144 mg/dL — AB (ref 70–99)
GLUCOSE-CAPILLARY: 180 mg/dL — AB (ref 70–99)
Glucose-Capillary: 154 mg/dL — ABNORMAL HIGH (ref 70–99)

## 2017-10-30 LAB — URINE CULTURE: Culture: >=100000 — AB

## 2017-10-30 MED ORDER — CIPROFLOXACIN HCL 500 MG PO TABS
500.0000 mg | ORAL_TABLET | Freq: Every day | ORAL | Status: DC
  Administered 2017-10-30 – 2017-11-04 (×6): 500 mg via ORAL
  Filled 2017-10-30 (×7): qty 1

## 2017-10-30 NOTE — Care Management Important Message (Signed)
Important Message  Patient Details  Name: Christine Santos MRN: [id number] Date of Birth: [birth year]   Medicare Important Message Given:  Yes    [name] 10/30/2017, 3:53 PM

## 2017-10-30 NOTE — Progress Notes (Signed)
Family Medicine Teaching Service Daily Progress Note Intern Pager: [phone]  Patient name: Christine Santos Medical record number: [id number] Date of birth: 12/30/1945 Age: 72 y.o. Gender: female  Primary Care Provider: [name], MD Consultants: nephrology Code Status: full  Pt Overview and Major Events to Date:  Admitted 10/4  Assessment and Plan: Christine Santos a 72 y.o.femalewho presented with abdominal pain found to have enterobacter bacteremia. 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  Enterobacter bacteremia Enterobacter cloacae likely from urine. Repeat blood cultures are negative. Cefepime started 10/5.  Switched to oral Cipro 10/7. Afebrile since admission, WBC elevated to 11.6, improved since admission.  Sensitivities still pending.  - discontinue cefepime (10/5-10/7) - switch to Cipro 500mg  qd for total of 10 days Abx treatment (10/7-10/15)  - monitor wbc -Continue home tramadol - prn zofran - monitor urine output - pharmacy counsel on discharge  Acute hypoxic respiratory failure with B/L pleural effusions Lung sounds clear bilaterally. Patient does not complain of difficulty breathing. Patient was on 1L [place] overnight but removed this morning with oxygen saturation >97% while talking. Complains of odynophagia, not no dysphagia. Throat not tender to palpation, mallampotti 1 with no sign of edema on exam. Voice is horse/raspy. Patient weight is decreased almost 2kg since yesterday. Continue diuresis and monitor urine output which is greatly decreased due to CKD V. Repeat xray to monitor pleural effusions if urine output not responsive to high dose diuresis. If effusion is not improved, thoracentesis to examine fluid as effusion to r/o malignancy.On torsemide 100 mg daily at home. -Appreciate nephrology recs -Continue Lasix to 160 mg twice daily IV -Strict I's and  O's -Daily weights - continue to monitor oxygen saturation -No indication for pulm consult at this time   Elevated Cr in CKD 5.  Today: K+4.9, BUN 158, Cr 8.76. Nephrology recommends hemodialysis prior to discharge with placement of tunneled hemodialysis catheter, but not emergent. Status post basilic vein transposition of left upper extremity on 10/23/2017. Nephrology will continue to follow -Nephrology consulted appreciate recs -Daily renal function panel - continue to monitor I/O's  Elevated troponin- improved - no indication for further work-up.   Atrial fibrillation. Stable.  - continue home metoprolol  - continue warfarin per pharm - discuss warfarin dose prescription for discharge  Hypertension, stable. Normotensive this am BP 154/80 (prior to getting BP meds) -Continue home hydralazine 50 mg 3 timesdaily and lopressor\ 25 mg twice daily -monitor vitals  Type 2 diabetes, stable. Last A1c 6.2 on 12/13/2017.  Lantus 16 units plus sliding scale at home. CBG this am 144, 180.  -A1c pending -Lantus 16 units -sSSI - monitor CBGs  Hyperlipidemia, stable. -Continue home rosuvastatin 20 mg  Anxiety/depression, stable. - continue home paxil 40 mg.  Pulmonary nodule. Incidentally found on CT. L lower lobe 46mm nodule, increased from 75mm in 2015 with likely increased size from adjacent atelectasis so thought to stable and benign. - follow up outpatient   FEN/GI: KVO/ renal diet PPx: warfarin  Disposition: med surg  Subjective:  Overnight: patient had voice change but no difficulty swallowing, no SOB.  Today: patient states she is feeling better and having no difficulty with breathing or swallowing, other than painful on swallow. She has no pain presently. She has no questions or concerns today  Objective: Temp:  [97.7 F (36.5 C)-98.3 F (36.8 C)] 97.8 F (36.6 C) (  10/07 0733) Pulse Rate:  [85-96] 85 (10/07 0733) Resp:  [19-24] 19 (10/07 0733) BP:  (120-154)/(52-80) 154/80 (10/07 0733) SpO2:  [90 %-98 %] 90 % (10/07 0748) Weight:  [74.4 kg] 74.4 kg (10/07 0500) Physical Exam: General: patient is resting comfortably, NAD Cardiovascular: irregular rhythm. 2/6 systolic murmur, JVD worse on right (patient in 45% incline in bed), no bruit appreciated Respiratory: clear to auscultation bilaterally,  Abdomen: soft, no tenderness to palpation, no distention  Extremities: trace edema in lower extremities  Laboratory: Recent Labs  Lab 10/28/17 0259 10/29/17 0322 10/30/17 0302  WBC 15.4* 10.4 11.6*  HGB 9.2* 9.5* 9.6*  HCT 28.6* 29.3* 29.7*  PLT 214 214 218   Recent Labs  Lab 10/27/17 1126 10/28/17 0259 10/29/17 0322 10/30/17 0302  NA 138 136 137 133*  K 4.5 5.2* 4.8 4.9  CL 101 99 102 99  CO2 20* 21* 22 20*  BUN 129* 138* 149* 158*  CREATININE 7.21* 7.66* 8.58* 8.76*  CALCIUM 9.1 8.7* 8.5* 8.1*  PROT 5.2*  --   --   --   BILITOT 1.1  --   --   --   ALKPHOS 67  --   --   --   ALT 20  --   --   --   AST 33  --   --   --   GLUCOSE 119* 138* 151* 168*    Imaging/Diagnostic Tests: None today  Richarda Osmond, DO 10/30/2017, 8:12 AM PGY-1, [place] Intern pager: [phone], text pages welcome

## 2017-10-30 NOTE — Progress Notes (Signed)
ANTICOAGULATION CONSULT NOTE - Follow-Up Consult  Pharmacy Consult for Warfarin Indication: atrial fibrillation  Patient Measurements: Height: 5' 5.5" (166.4 cm) Weight: 164 lb 0.4 oz (74.4 kg) IBW/kg (Calculated) : 58.15  Vital Signs: Temp: 97.8 F (36.6 C) (10/07 0733) Temp Source: Oral (10/07 0733) BP: 154/80 (10/07 0733) Pulse Rate: 85 (10/07 0733)  Labs: Recent Labs    10/28/17 0259 10/29/17 0322 10/30/17 0302  HGB 9.2* 9.5* 9.6*  HCT 28.6* 29.3* 29.7*  PLT 214 214 218  LABPROT 27.2* 28.4* 31.0*  INR 2.54 2.69 3.02  CREATININE 7.66* 8.58* 8.76*  TROPONINI 0.18*  --   --     Estimated Creatinine Clearance: 5.9 mL/min (A) (by C-G formula based on SCr of 8.76 mg/dL (H)).  Medications: Allopurinol 100mg  daily (started 10/5) Paroxetine 40mg  daily (home med)  Assessment: 72 yo female on chronic warfarin therapy for atrial fibrillation.  Admitted for abdominal pain thought to be associated with possible fluid overload/pleural effusion. Pharmacy consulted to dose warfarin as an inpatient. INR on admission 1.78  Home warfarin regimen : 2.5 mg by mouth on Sundays, Tuesdays, Wednesdays, Thursdays, Saturdays and 5 mg by mouth on Mondays & Fridays.  INR today is slightly SUPRAtherapeutic (INR 3.02 << 2.69, goal of 2-3). Hgb/Hct low but stable, plts wnl. Increased sensitivity liked related to reduced renal function and possibly reduced po intake. Will hold today's dose and monitor trends.  Goal of Therapy:  INR 2-3   Plan:  - Hold warfarin - Will continue to monitor for any signs/symptoms of bleeding and will follow up with PT/INR in the a.m.   Thank you for allowing pharmacy to be a part of this patient's care.  Alycia Rossetti, PharmD, BCPS Clinical Pharmacist Pager: [phone] Clinical phone for 10/30/2017 from 7a-3:30p: [phone] If after 3:30p, please call main pharmacy at: [phone] Please check AMION for all [facility] numbers 10/30/2017 9:16 AM

## 2017-10-30 NOTE — Progress Notes (Signed)
[facility] Progress Note   Subjective:     Afebrile, BPs st able,   B Cx 10/4 2/2 Enterobacter, U Cx the same  10/6: NGTD  SCr up to 8.76, K 4.9, HCO3 20  Now on cipro  Pt and friend are hopeful not to req HD  SCr up to 8.79, BUN 158; denies overt uremia  Objective Vitals:   10/30/17 0300 10/30/17 0500 10/30/17 0733 10/30/17 0748  BP: 125/63  (!) 154/80   Pulse: 85  85   Resp: 20  19   Temp: 98 F (36.7 C)  97.8 F (36.6 C)   TempSrc: Oral  Oral   SpO2: 98%  96% 90%  Weight:  74.4 kg    Height:       Physical Exam General: lying flat in bed, comfortable, nontoxic Heart: irreg irreg, syst murmur, no rub Lungs: rales on left, dec BS right, normal WOB Abdomen: soft nontender Extremities: diffuse edema Dialysis Access: LUE AVF post op ecchymosis and dressing  Additional Objective Labs: Basic Metabolic Panel: Recent Labs  Lab 10/28/17 0259 10/29/17 0322 10/30/17 0302  NA 136 137 133*  K 5.2* 4.8 4.9  CL 99 102 99  CO2 21* 22 20*  GLUCOSE 138* 151* 168*  BUN 138* 149* 158*  CREATININE 7.66* 8.58* 8.76*  CALCIUM 8.7* 8.5* 8.1*  PHOS 5.7* 5.9* 5.5*   Liver Function Tests: Recent Labs  Lab 10/27/17 1126 10/28/17 0259 10/29/17 0322 10/30/17 0302  AST 33  --   --   --   ALT 20  --   --   --   ALKPHOS 67  --   --   --   BILITOT 1.1  --   --   --   PROT 5.2*  --   --   --   ALBUMIN 2.9* 2.5* 2.5* 2.5*   Recent Labs  Lab 10/27/17 1126  LIPASE 21   CBC: Recent Labs  Lab 10/27/17 1126 10/28/17 0259 10/29/17 0322 10/30/17 0302  WBC 15.0* 15.4* 10.4 11.6*  NEUTROABS  --  13.2*  --  8.2*  HGB 10.2* 9.2* 9.5* 9.6*  HCT 32.0* 28.6* 29.3* 29.7*  MCV 97.6 97.3 97.0 95.8  PLT 224 214 214 218   Blood Culture    Component Value Date/Time   SDES BLOOD RIGHT ANTECUBITAL 10/29/2017 1125   SDES BLOOD RIGHT ANTECUBITAL 10/29/2017 1125   SPECREQUEST  10/29/2017 1125    BOTTLES DRAWN AEROBIC AND ANAEROBIC Blood Culture adequate volume    SPECREQUEST  10/29/2017 1125    BOTTLES DRAWN AEROBIC AND ANAEROBIC Blood Culture adequate volume   CULT  10/29/2017 1125    NO GROWTH < 24 HOURS Performed at [facility], [address] [place], [place] [address]    CULT  10/29/2017 1125    NO GROWTH < 24 HOURS Performed at [facility], [place] [address] [place], [place] [address]    REPTSTATUS PENDING 10/29/2017 1125   REPTSTATUS PENDING 10/29/2017 1125    Cardiac Enzymes: Recent Labs  Lab 10/28/17 0259  TROPONINI 0.18*   CBG: Recent Labs  Lab 10/29/17 0754 10/29/17 1218 10/29/17 1651 10/29/17 2206 10/30/17 0732  GLUCAP 126* 163* 161* 153* 144*   Iron Studies: No results for input(s): IRON, TIBC, TRANSFERRIN, FERRITIN in the last 72 hours. @lablastinr3 @ Studies/Results: No results found. Medications: . sodium chloride    . furosemide 160 mg (10/30/17 0829)   . allopurinol  100 mg Oral Daily  .  ciprofloxacin  500 mg Oral Daily  . hydrALAZINE  50 mg Oral TID  . insulin aspart  0-9 Units Subcutaneous TID WC  . insulin glargine  16 Units Subcutaneous QHS  . metoprolol tartrate  25 mg Oral BID  . pantoprazole  40 mg Oral QHS  . PARoxetine  40 mg Oral BH-q7a  . rosuvastatin  20 mg Oral QHS  . Warfarin - Pharmacist Dosing Inpatient   Does not apply q1800   Assessment/Plan: 1.  CKD 5 -discussed with patient long-term friend.  Agree with previous plan that will likely require/recommend hemodialysis initiation prior to discharge.  Follow-up blood cultures for less than 24 hours old.  No immediate indication for hemodialysis, discussed waiting another 24 hours and at that point likely to recommend placement of tunneled hemodialysis catheter.  Patient and friend are very hopeful that this will not be necessary.  I tried to be sensitive but realistic about where she stands at the current time.  Continue to dialogue.  2.  Anasarca - Not responding to high dose diuretics, as above   3.  Abdominal pain - not  present today  4.   Anemia - Hb stable in 9s. CTM for now  5.  Pleural effusion - pulmonary to evaluate  6.  Diastolic heart failure - diuresing per above.   7.. Atrial fibrillation - on metoprolol, rate controlled; on coumadin dosing per pharmacy.  8.  Enterobacter bacteremia - suspected urinary source. ? cefepine allergy, on cipro now  9.  HTN - normotensive. on hydralazine 50 TID, MTP 25 BID  10.  DM - per primary   11. S/p 2nd stage BVT 10/23/17 with Threasa Alpha MD 10/30/2017, 10:58 AM  [facility] Pager: [phone]

## 2017-10-31 DIAGNOSIS — R609 Edema, unspecified: Secondary | ICD-10-CM

## 2017-10-31 LAB — CBC
HCT: 30 % — ABNORMAL LOW (ref 36.0–46.0)
Hemoglobin: 9.8 g/dL — ABNORMAL LOW (ref 12.0–15.0)
MCH: 31.2 pg (ref 26.0–34.0)
MCHC: 32.7 g/dL (ref 30.0–36.0)
MCV: 95.5 fL (ref 80.0–100.0)
PLATELETS: 223 10*3/uL (ref 150–400)
RBC: 3.14 MIL/uL — AB (ref 3.87–5.11)
RDW: 17.6 % — AB (ref 11.5–15.5)
WBC: 11.2 10*3/uL — AB (ref 4.0–10.5)

## 2017-10-31 LAB — GLUCOSE, CAPILLARY
GLUCOSE-CAPILLARY: 198 mg/dL — AB (ref 70–99)
GLUCOSE-CAPILLARY: 114 mg/dL — AB (ref 70–99)
GLUCOSE-CAPILLARY: 194 mg/dL — AB (ref 70–99)
GLUCOSE-CAPILLARY: 131 mg/dL — AB (ref 70–99)
Glucose-Capillary: 155 mg/dL — ABNORMAL HIGH (ref 70–99)

## 2017-10-31 LAB — RENAL FUNCTION PANEL
ALBUMIN: 2.4 g/dL — AB (ref 3.5–5.0)
Anion gap: 14 (ref 5–15)
BUN: 162 mg/dL — AB (ref 8–23)
CALCIUM: 8 mg/dL — AB (ref 8.9–10.3)
CHLORIDE: 100 mmol/L (ref 98–111)
CO2: 20 mmol/L — ABNORMAL LOW (ref 22–32)
CREATININE: 9.04 mg/dL — AB (ref 0.44–1.00)
GFR calc non Af Amer: 4 mL/min — ABNORMAL LOW (ref >60)
GFR, EST AFRICAN AMERICAN: 4 mL/min — AB (ref >60)
Glucose, Bld: 132 mg/dL — ABNORMAL HIGH (ref 70–99)
PHOSPHORUS: 5.3 mg/dL — AB (ref 2.5–4.6)
Potassium: 4.7 mmol/L (ref 3.5–5.1)
SODIUM: 134 mmol/L — AB (ref 135–145)

## 2017-10-31 LAB — PROTIME-INR
INR: 2.79
PROTHROMBIN TIME: 29.2 s — AB (ref 11.4–15.2)

## 2017-10-31 MED ORDER — ROSUVASTATIN CALCIUM 10 MG PO TABS
10.0000 mg | ORAL_TABLET | Freq: Every day | ORAL | Status: DC
  Administered 2017-10-31 – 2017-11-04 (×5): 10 mg via ORAL
  Filled 2017-10-31 (×5): qty 1

## 2017-10-31 MED ORDER — WARFARIN SODIUM 2.5 MG PO TABS
2.5000 mg | ORAL_TABLET | Freq: Once | ORAL | Status: AC
  Administered 2017-10-31: 2.5 mg via ORAL
  Filled 2017-10-31: qty 1

## 2017-10-31 MED ORDER — TRAMADOL HCL 50 MG PO TABS
50.0000 mg | ORAL_TABLET | Freq: Two times a day (BID) | ORAL | Status: DC | PRN
  Administered 2017-10-31: 50 mg via ORAL
  Filled 2017-10-31: qty 1

## 2017-10-31 MED ORDER — ALLOPURINOL 100 MG PO TABS
50.0000 mg | ORAL_TABLET | Freq: Every day | ORAL | Status: DC
  Administered 2017-10-31 – 2017-11-05 (×6): 50 mg via ORAL
  Filled 2017-10-31 (×6): qty 1

## 2017-10-31 NOTE — Care Management Note (Addendum)
Case Management Note  Patient Details  Name: DENYLA CORTESE MRN: [id number] Date of Birth: 1945/06/18  Subjective/Objective:  From home , presents  with acute onset abdominal pain. and large right pleural effusion, and free intraperitoneal fluid.   per pt eval rec HHPT,HHOT NCM offered choice from [facility] list, she chose [facility], referral given to [facility]. Soc will begin 24-48 hrs post dc.  She has a walker, transport chair, rollator, quad cane, handicap grab bars, shower stool, bsc.    10/11 Tomi Bamberger RN, BSN - patient now ESRD, first HD on 10/9, clip in process.                        Action/Plan: NCM will follow for transition of care needs.   Expected Discharge Date:  10/29/17               Expected Discharge Plan:  [place]  In-House Referral:     Discharge planning Services  CM Consult  Post Acute Care Choice:  Home Health Choice offered to:  Patient, Spouse  DME Arranged:    DME Agency:     HH Arranged:  PT, OT HH Agency:  [facility]  Status of Service:  In process, will continue to follow  If discussed at Long Length of Stay Meetings, dates discussed:    Additional Comments:  Zenon Mayo, RN 10/31/2017, 4:38 PM

## 2017-10-31 NOTE — Progress Notes (Signed)
Family Medicine Teaching Service Daily Progress Note Intern Pager: [phone]  Patient name: Christine Santos Medical record number: [id number] Date of birth: 28-Feb-1945 Age: 72 y.o. Gender: female  Primary Care Provider: [name], MD Consultants: nephrology Code Status: full  Pt Overview and Major Events to Date:  Admitted 10/4  Assessment and Plan: Christine Santos a 72 y.o.femalewho presented with abdominal pain found to have enterobacter bacteremia. 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  Enterobacter bacteremia Enterobacter cloacae likely from urine. Repeat blood cultures are negative. Cefepime started 10/5.  Switched to oral Cipro 10/7. Afebrile since admission, WBC elevated to 11.2, improved since admission. - continue Cipro 500mg  qd for total of 10 days Abx treatment (10/7-10/15)  - monitor wbc - Continue home tramadol - prn zofran - monitor urine output - pharmacy counsel on discharge  Acute hypoxic respiratory failure with B/L pleural effusions Lung sounds clear bilaterally. Patient does not complain of difficulty breathing or difficulty swallowing. Patient was on 1L [place] overnight but removed this morning with oxygen saturation >92% while talking. Voice is horse/raspy but improved from yesterday. Patient weight is slightly increased since yesterday. Continue diuresis and monitor urine output which is greatly decreased due to CKD V. Repeat xray to monitor pleural effusions if urine output not responsive to high dose diuresis. If effusion is not improved, thoracentesis to examine fluid as effusion to r/o malignancy. On torsemide 100 mg daily at home. 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    Patient has 2+ pitting edema in bilateral extremities. She also has significant non-pitting edema in left hand and forearm. She is making small amount of urine, but nursing is not measuring all output. Today: K+4.7, BUN 162, Cr 9.04. Nephrology recommends hemodialysis prior to discharge with placement of tunneled hemodialysis catheter, but not emergent. Status post basilic vein transposition of left upper extremity on 10/23/2017. Nephrology will continue to follow. -Daily renal function panel - continue to monitor STRICT I/O's - ordered PT/OT - encouraged patient to walk more today and keep feet elevated when not up and moving. - f/u nephrology recs  Elevated troponin- improved - no indication for further work-up.   Atrial fibrillation. Stable. INR 2.79 today. Warfarin held last 2 days. - continue home metoprolol  - continue warfarin per pharm - discuss warfarin dose prescription for discharge  Hypertension, stable. Normotensive this am BP 143/87 (prior to getting BP meds) -Continue home hydralazine 50 mg 3 timesdaily and lopressor\ 25 mg twice daily -monitor vitals  Type 2 diabetes, stable. A1c 7.0 this admission.Lantus 16 units plus sliding scale at home. CBG this am 132, 114, received 2 units novolog last night -Lantus 16 units -sSSI - monitor CBGs  Hyperlipidemia, stable. -Continue home rosuvastatin 20 mg  Anxiety/depression, stable. - continue home paxil 40 mg.  Pulmonary nodule. Incidentally found on CT. L lower lobe 47mm nodule, increased from 12mm in 2015 with likely increased size from adjacent atelectasis so thought to be stable and benign. - follow up outpatient   FEN/GI: KVO/ renal diet PPx: warfarin  Disposition: med surg  Subjective:  Overnight: patient had no acute events. She was seen by her PCP Dr. Andria Frames.  Today: patient states she  is feeling okay. Denies any pain or difficulty with swallowing. Her family members are concerned that no one is coming to  help her walk around. They also discussed being very displeased with the nephrology doctor's bedside manner. Family wants to know why no one is measuring her urine output. Patient is very worried about having to do dialysis.   Objective: Temp:  [97.7 F (36.5 C)-98.1 F (36.7 C)] 97.7 F (36.5 C) (10/08 0405) Pulse Rate:  [85-99] 99 (10/08 0405) Resp:  [16-19] 16 (10/07 1955) BP: (114-154)/(66-80) 114/69 (10/08 0405) SpO2:  [90 %-96 %] 91 % (10/08 0405) Weight:  [74.6 kg] 74.6 kg (10/08 0405) Physical Exam: General: patient is sitting up in bed comfortably, NAD Cardiovascular: irregular rhythm. 3/6 systolic murmur Respiratory: clear to auscultation bilaterally, no increased WOB Abdomen: soft, no tenderness to palpation, no distention  Extremities: 2+ pitting edema in bilateral lower extremities. Significant swelling (non-pitting) in left hand and forearm. Slightly painful to palpation.   Laboratory: Recent Labs  Lab 10/29/17 0322 10/30/17 0302 10/31/17 0218  WBC 10.4 11.6* 11.2*  HGB 9.5* 9.6* 9.8*  HCT 29.3* 29.7* 30.0*  PLT 214 218 223   Recent Labs  Lab 10/27/17 1126  10/29/17 0322 10/30/17 0302 10/31/17 0218  NA 138   < > 137 133* 134*  K 4.5   < > 4.8 4.9 4.7  CL 101   < > 102 99 100  CO2 20*   < > 22 20* 20*  BUN 129*   < > 149* 158* 162*  CREATININE 7.21*   < > 8.58* 8.76* 9.04*  CALCIUM 9.1   < > 8.5* 8.1* 8.0*  PROT 5.2*  --   --   --   --   BILITOT 1.1  --   --   --   --   ALKPHOS 67  --   --   --   --   ALT 20  --   --   --   --   AST 33  --   --   --   --   GLUCOSE 119*   < > 151* 168* 132*   < > = values in this interval not displayed.    Imaging/Diagnostic Tests: None today  Richarda Osmond, DO 10/31/2017, 7:30 AM PGY-1, [place] Intern pager: [phone], text pages welcome

## 2017-10-31 NOTE — Evaluation (Signed)
Physical Therapy Evaluation Patient Details Name: Christine Santos MRN: [id number] DOB: [birth year] Today's Date: 10/31/2017   History of Present Illness  Pt is a 72 y/o female admitted secondary to abdominal pain. Found to have enterobacter bacteremia. Also presenting with acute hypoxic repiratory failure with bilateral pleural effusions and progressive CKD 5 with likely need to start HD during admission. PMH includes CAD s/p CABG, DM, CKD, HTN, a fib, and PVD.   Clinical Impression  Pt admitted secondary to problem above with deficits below. Pt limited by fatigue this session, and required min guard A for ambulation using RW. Pt also presenting with weakness and LUE swelling. Encouraged OOB mobility with assist from staff on days PT is not able to see pt. Will continue to follow acutely to maximize functional mobility independence and safety.     Follow Up Recommendations Home health PT;Supervision for mobility/OOB    Equipment Recommendations  None recommended by PT    Recommendations for Other Services OT consult     Precautions / Restrictions Precautions Precautions: Fall Restrictions Weight Bearing Restrictions: No      Mobility  Bed Mobility Overal bed mobility: Needs Assistance Bed Mobility: Sit to Supine       Sit to supine: Supervision   General bed mobility comments: supervision and extended time to return to supine.   Transfers Overall transfer level: Needs assistance Equipment used: Rolling walker (2 wheeled) Transfers: Sit to/from Stand Sit to Stand: Min guard         General transfer comment: Min guard for safety. No physical assist required.   Ambulation/Gait Ambulation/Gait assistance: Min guard Gait Distance (Feet): 75 Feet Assistive device: Rolling walker (2 wheeled) Gait Pattern/deviations: Step-through pattern;Decreased stride length Gait velocity: Decreased    General Gait Details: Slow, very guarded gait. Decreased weightshift to LLE as pt  reports it sometimes "gives out" on her. Pt with increased fatigue, so distance limited this session.   Stairs            Wheelchair Mobility    Modified Rankin (Stroke Patients Only)       Balance Overall balance assessment: Needs assistance Sitting-balance support: No upper extremity supported;Feet supported Sitting balance-Leahy Scale: Good     Standing balance support: Bilateral upper extremity supported;During functional activity Standing balance-Leahy Scale: Poor Standing balance comment: Reliant on BUE support                              Pertinent Vitals/Pain Pain Assessment: No/denies pain    Home Living Family/patient expects to be discharged to:: Private residence Living Arrangements: Non-relatives/Friends Available Help at Discharge: Friend(s);Available 24 hours/day Type of Home: House Home Access: Stairs to enter Entrance Stairs-Rails: Left Entrance Stairs-Number of Steps: 2 Home Layout: One level Home Equipment: Walker - 2 wheels;Cane - quad;Cane - single point;Shower seat;Grab bars - Dispensing optician - 4 wheels;Bedside commode Additional Comments: Lives with her friend Abe People who is visually impaired.    Prior Function Level of Independence: Independent         Comments: Reports she was using cane vs RW depending on the day.      Hand Dominance        Extremity/Trunk Assessment   Upper Extremity Assessment Upper Extremity Assessment: Defer to OT evaluation;LUE deficits/detail LUE Deficits / Details: LUE very swollen, especially in hands    Lower Extremity Assessment Lower Extremity Assessment: Generalized weakness    Cervical / Trunk Assessment Cervical / Trunk  Assessment: Kyphotic  Communication   Communication: No difficulties  Cognition Arousal/Alertness: Awake/alert Behavior During Therapy: WFL for tasks assessed/performed Overall Cognitive Status: Within Functional Limits for tasks assessed                                         General Comments General comments (skin integrity, edema, etc.): Pt's friend present throughout session.     Exercises     Assessment/Plan    PT Assessment Patient needs continued PT services  PT Problem List Cardiopulmonary status limiting activity;Decreased strength;Decreased activity tolerance;Decreased balance;Decreased mobility       PT Treatment Interventions DME instruction;Gait training;Stair training;Functional mobility training;Therapeutic activities;Therapeutic exercise;Balance training;Patient/family education    PT Goals (Current goals can be found in the Care Plan section)  Acute Rehab PT Goals Patient Stated Goal: to keep her strength up  PT Goal Formulation: With patient Time For Goal Achievement: 11/14/17 Potential to Achieve Goals: Good    Frequency Min 3X/week   Barriers to discharge        Co-evaluation               AM-PAC PT "6 Clicks" Daily Activity  Outcome Measure Difficulty turning over in bed (including adjusting bedclothes, sheets and blankets)?: A Little Difficulty moving from lying on back to sitting on the side of the bed? : A Little Difficulty sitting down on and standing up from a chair with arms (e.g., wheelchair, bedside commode, etc,.)?: Unable Help needed moving to and from a bed to chair (including a wheelchair)?: A Little Help needed walking in hospital room?: A Little Help needed climbing 3-5 steps with a railing? : A Lot 6 Click Score: 15    End of Session Equipment Utilized During Treatment: Gait belt Activity Tolerance: Patient limited by fatigue Patient left: in bed;with call bell/phone within reach;with family/visitor present Nurse Communication: Mobility status PT Visit Diagnosis: Muscle weakness (generalized) (M62.81);Other abnormalities of gait and mobility (R26.89)    Time: 7121-9758 PT Time Calculation (min) (ACUTE ONLY): 29 min   Charges:   PT Evaluation $PT  Eval Low Complexity: 1 Low PT Treatments $Gait Training: 8-22 mins        Leighton Ruff, PT, DPT  Acute Rehabilitation Services  Pager: [phone] Office: [phone]   Rudean Hitt 10/31/2017, 1:30 PM

## 2017-10-31 NOTE — Progress Notes (Signed)
[facility] Progress Note   Subjective:     Afebrile, BPs st able,   UOP not well recorded; stable weights  SCr inc to 9.04, BUN 162, K 4.7, HCO3 20  B Cx 10/4 2/2 Enterobacter, U Cx the same  10/6: NG at 1d  Along with Dr. Erin Hearing, I discussed with patient, family, friend that she needs to initiate hemodialysis during this admission; she wants ot cont to think about it  Objective Vitals:   10/30/17 1955 10/30/17 2357 10/31/17 0405 10/31/17 0740  BP: 130/66 132/79 114/69 (!) 143/87  Pulse:  87 99 82  Resp: 16     Temp: 97.9 F (36.6 C) 97.7 F (36.5 C) 97.7 F (36.5 C) 98 F (36.7 C)  TempSrc:  Oral Oral Oral  SpO2: 96% 95% 91% 94%  Weight:   74.6 kg   Height:       Physical Exam General: eating breakfast, sitting up, comfortable, nontoxic Heart: irreg irreg, syst murmur, no rub Lungs: rales on left, dec BS right, normal WOB Abdomen: soft nontender Extremities: diffuse edema Dialysis Access: LUE AVF post op ecchymosis and dressing  Additional Objective Labs: Basic Metabolic Panel: Recent Labs  Lab 10/29/17 0322 10/30/17 0302 10/31/17 0218  NA 137 133* 134*  K 4.8 4.9 4.7  CL 102 99 100  CO2 22 20* 20*  GLUCOSE 151* 168* 132*  BUN 149* 158* 162*  CREATININE 8.58* 8.76* 9.04*  CALCIUM 8.5* 8.1* 8.0*  PHOS 5.9* 5.5* 5.3*   Liver Function Tests: Recent Labs  Lab 10/27/17 1126  10/29/17 0322 10/30/17 0302 10/31/17 0218  AST 33  --   --   --   --   ALT 20  --   --   --   --   ALKPHOS 67  --   --   --   --   BILITOT 1.1  --   --   --   --   PROT 5.2*  --   --   --   --   ALBUMIN 2.9*   < > 2.5* 2.5* 2.4*   < > = values in this interval not displayed.   Recent Labs  Lab 10/27/17 1126  LIPASE 21   CBC: Recent Labs  Lab 10/27/17 1126 10/28/17 0259 10/29/17 0322 10/30/17 0302 10/31/17 0218  WBC 15.0* 15.4* 10.4 11.6* 11.2*  NEUTROABS  --  13.2*  --  8.2*  --   HGB 10.2* 9.2* 9.5* 9.6* 9.8*  HCT 32.0* 28.6* 29.3* 29.7*  30.0*  MCV 97.6 97.3 97.0 95.8 95.5  PLT 224 214 214 218 223   Blood Culture    Component Value Date/Time   SDES BLOOD RIGHT ANTECUBITAL 10/29/2017 1125   SDES BLOOD RIGHT ANTECUBITAL 10/29/2017 1125   SPECREQUEST  10/29/2017 1125    BOTTLES DRAWN AEROBIC AND ANAEROBIC Blood Culture adequate volume   SPECREQUEST  10/29/2017 1125    BOTTLES DRAWN AEROBIC AND ANAEROBIC Blood Culture adequate volume   CULT  10/29/2017 1125    NO GROWTH 1 DAY Performed at [facility], [place] [address] [place], [place] [address]    CULT  10/29/2017 1125    NO GROWTH 1 DAY Performed at [facility], [place] [address], [place], [place] [address]    REPTSTATUS PENDING 10/29/2017 1125   REPTSTATUS PENDING 10/29/2017 1125    Cardiac Enzymes: Recent Labs  Lab 10/28/17 0259  TROPONINI 0.18*   CBG: Recent Labs  Lab 10/30/17 0732 10/30/17  1146 10/30/17 1702 10/30/17 2114 10/31/17 0713  GLUCAP 144* 180* 154* 198* 114*   Iron Studies: No results for input(s): IRON, TIBC, TRANSFERRIN, FERRITIN in the last 72 hours. @lablastinr3 @ Studies/Results: No results found. Medications: . sodium chloride    . furosemide Stopped (10/30/17 2031)   . allopurinol  50 mg Oral Daily  . ciprofloxacin  500 mg Oral Daily  . hydrALAZINE  50 mg Oral TID  . insulin aspart  0-9 Units Subcutaneous TID WC  . insulin glargine  16 Units Subcutaneous QHS  . metoprolol tartrate  25 mg Oral BID  . pantoprazole  40 mg Oral QHS  . PARoxetine  40 mg Oral BH-q7a  . rosuvastatin  10 mg Oral QHS  . Warfarin - Pharmacist Dosing Inpatient   Does not apply q1800   Assessment/Plan: 1.    Progressive CKD 5 -recommend initiation of hemodialysis and placement of tunneled hemodialysis catheter.  Discussed at length with patient and family.  I think she is coming around.  Follow-up later today or tomorrow morning.  2.  Anasarca - Not responding to high dose diuretics, as above   3.  Abdominal pain - not present  today  4.   Anemia - Hb stable in 9s. CTM for now  5.  Pleural effusion - pulmonary to evaluate  6.  Diastolic heart failure - diuresing per above.   7.. Atrial fibrillation - on metoprolol, rate controlled; on coumadin dosing per pharmacy.  8.  Enterobacter bacteremia - suspected urinary source. ? cefepine allergy, on cipro now; surveillance Cx NG > 1d  9.  HTN - normotensive. on hydralazine 50 TID, MTP 25 BID  10.  DM - per primary   11. S/p 2nd stage BVT 10/23/17 with Reina Fuse MD 10/31/2017, 9:05 AM  [place]

## 2017-10-31 NOTE — Progress Notes (Signed)
ANTICOAGULATION CONSULT NOTE - Follow-Up Consult  Pharmacy Consult for Warfarin Indication: atrial fibrillation  Patient Measurements: Height: 5' 5.5" (166.4 cm) Weight: 164 lb 7.4 oz (74.6 kg) IBW/kg (Calculated) : 58.15  Vital Signs: Temp: 98 F (36.7 C) (10/08 0740) Temp Source: Oral (10/08 0740) BP: 143/87 (10/08 0740) Pulse Rate: 82 (10/08 0740)  Labs: Recent Labs    10/29/17 0322 10/30/17 0302 10/31/17 0218  HGB 9.5* 9.6* 9.8*  HCT 29.3* 29.7* 30.0*  PLT 214 218 223  LABPROT 28.4* 31.0* 29.2*  INR 2.69 3.02 2.79  CREATININE 8.58* 8.76* 9.04*    Estimated Creatinine Clearance: 5.8 mL/min (A) (by C-G formula based on SCr of 9.04 mg/dL (H)).  Medications: Allopurinol 100mg  daily (started 10/5) Paroxetine 40mg  daily (home med)  Assessment: 72 yo female on chronic warfarin therapy for atrial fibrillation.  Admitted for abdominal pain thought to be associated with possible fluid overload/pleural effusion. Pharmacy consulted to dose warfarin as an inpatient. INR on admission 1.78  Home warfarin regimen : 2.5 mg by mouth on Sundays, Tuesdays, Wednesdays, Thursdays, Saturdays and 5 mg by mouth on Mondays & Fridays.  INR therapeutic at 2.79, H/H low but stable, no bleeding reported, 100% meals eaten 10/7.  Also started cipro 10/7 for 8 days, may have reduced requirements (dose held 10/7)  Goal of Therapy:  INR 2-3   Plan:  Warfarin 2.5mg  PO x 1 tonight Daily INR, CBC, s/s bleeding  Bertis Ruddy, PharmD Clinical Pharmacist Please check AMION for all [place] numbers 10/31/2017 11:26 AM

## 2017-11-01 ENCOUNTER — Encounter (HOSPITAL_COMMUNITY): Payer: Self-pay | Admitting: Interventional Radiology

## 2017-11-01 ENCOUNTER — Inpatient Hospital Stay (HOSPITAL_COMMUNITY): Payer: Medicare Other

## 2017-11-01 DIAGNOSIS — Z992 Dependence on renal dialysis: Secondary | ICD-10-CM

## 2017-11-01 DIAGNOSIS — N186 End stage renal disease: Secondary | ICD-10-CM

## 2017-11-01 DIAGNOSIS — R197 Diarrhea, unspecified: Secondary | ICD-10-CM

## 2017-11-01 DIAGNOSIS — R079 Chest pain, unspecified: Secondary | ICD-10-CM

## 2017-11-01 HISTORY — PX: IR US GUIDE VASC ACCESS RIGHT: IMG2390

## 2017-11-01 HISTORY — PX: IR FLUORO GUIDE CV LINE RIGHT: IMG2283

## 2017-11-01 LAB — CBC
HCT: 30.8 % — ABNORMAL LOW (ref 36.0–46.0)
HEMOGLOBIN: 9.9 g/dL — AB (ref 12.0–15.0)
MCH: 30.4 pg (ref 26.0–34.0)
MCHC: 32.1 g/dL (ref 30.0–36.0)
MCV: 94.5 fL (ref 80.0–100.0)
Platelets: 258 10*3/uL (ref 150–400)
RBC: 3.26 MIL/uL — ABNORMAL LOW (ref 3.87–5.11)
RDW: 17.7 % — ABNORMAL HIGH (ref 11.5–15.5)
WBC: 10.3 10*3/uL (ref 4.0–10.5)
nRBC: 0 % (ref 0.0–0.2)

## 2017-11-01 LAB — GLUCOSE, CAPILLARY
GLUCOSE-CAPILLARY: 132 mg/dL — AB (ref 70–99)
GLUCOSE-CAPILLARY: 121 mg/dL — AB (ref 70–99)
Glucose-Capillary: 105 mg/dL — ABNORMAL HIGH (ref 70–99)
Glucose-Capillary: 166 mg/dL — ABNORMAL HIGH (ref 70–99)

## 2017-11-01 LAB — RENAL FUNCTION PANEL
ALBUMIN: 2.5 g/dL — AB (ref 3.5–5.0)
ANION GAP: 13 (ref 5–15)
BUN: 163 mg/dL — ABNORMAL HIGH (ref 8–23)
CALCIUM: 8.2 mg/dL — AB (ref 8.9–10.3)
CO2: 22 mmol/L (ref 22–32)
Chloride: 99 mmol/L (ref 98–111)
Creatinine, Ser: 8.69 mg/dL — ABNORMAL HIGH (ref 0.44–1.00)
GFR, EST AFRICAN AMERICAN: 5 mL/min — AB (ref >60)
GFR, EST NON AFRICAN AMERICAN: 4 mL/min — AB (ref >60)
Glucose, Bld: 150 mg/dL — ABNORMAL HIGH (ref 70–99)
PHOSPHORUS: 5.8 mg/dL — AB (ref 2.5–4.6)
Potassium: 4.4 mmol/L (ref 3.5–5.1)
SODIUM: 134 mmol/L — AB (ref 135–145)

## 2017-11-01 LAB — PROTIME-INR
INR: 2.25
Prothrombin Time: 24.7 seconds — ABNORMAL HIGH (ref 11.4–15.2)

## 2017-11-01 LAB — ALT: ALT: 21 U/L (ref 0–44)

## 2017-11-01 MED ORDER — SODIUM CHLORIDE 0.9 % IV SOLN: 100.0000 mL | INTRAVENOUS | Status: DC | PRN

## 2017-11-01 MED ORDER — CHLORHEXIDINE GLUCONATE CLOTH 2 % EX PADS
6.0000 | MEDICATED_PAD | Freq: Every day | CUTANEOUS | Status: DC
  Administered 2017-11-01 – 2017-11-05 (×4): 6 via TOPICAL

## 2017-11-01 MED ORDER — ALTEPLASE 2 MG IJ SOLR: 2.0000 mg | Freq: Once | INTRAMUSCULAR | Status: DC | PRN

## 2017-11-01 MED ORDER — HEPARIN SODIUM (PORCINE) 1000 UNIT/ML IJ SOLN
INTRAMUSCULAR | Status: AC
  Filled 2017-11-01: qty 3

## 2017-11-01 MED ORDER — LIDOCAINE-PRILOCAINE 2.5-2.5 % EX CREA
1.0000 "application " | TOPICAL_CREAM | CUTANEOUS | Status: DC | PRN
  Filled 2017-11-01: qty 5

## 2017-11-01 MED ORDER — LIDOCAINE HCL (PF) 1 % IJ SOLN
INTRAMUSCULAR | Status: AC | PRN
  Administered 2017-11-01: 5 mL

## 2017-11-01 MED ORDER — HEPARIN SODIUM (PORCINE) 1000 UNIT/ML IJ SOLN
INTRAMUSCULAR | Status: AC
  Administered 2017-11-01: 1000 [IU] via INTRAVENOUS_CENTRAL
  Filled 2017-11-01: qty 1

## 2017-11-01 MED ORDER — HEPARIN SODIUM (PORCINE) 1000 UNIT/ML DIALYSIS
1000.0000 [IU] | INTRAMUSCULAR | Status: DC | PRN
  Administered 2017-11-01: 1000 [IU] via INTRAVENOUS_CENTRAL
  Filled 2017-11-01 (×2): qty 1

## 2017-11-01 MED ORDER — LIDOCAINE HCL 1 % IJ SOLN
INTRAMUSCULAR | Status: AC
  Filled 2017-11-01: qty 20

## 2017-11-01 NOTE — Progress Notes (Signed)
I saw and examined Ms. Christine Santos.  Dr. Ouida Sills was with me, so we have a clear understanding of the plan. Ms Christine Santos is recovering from bacteremia from a urinary source.  The issue at hand is dialysis.  She has clinically significant volume overload which is not responding to loop diuretics.  I explained that this is a clear indication for dialysis.  She is aware that it is within the realm of possibility that her kidneys might recover from the acute insult and just to make the decision more complicated, she may have had a slight uptick in her diuresis over the last 12 hours.  We discussed her priorities and the biggest one is to get out of the hospital ASAP.  I explained that prompt dialysis is the quickest route to discharge.  We collaboratively made the decision to proceed with dialysis now.

## 2017-11-01 NOTE — Progress Notes (Signed)
[facility] Progress Note   Subjective:     Ready to start HD she says  Need TDC  Objective Vitals:   10/31/17 1216 10/31/17 1708 10/31/17 1953 10/31/17 2326  BP: 131/64 124/72 (!) 150/80 (!) 157/73  Pulse: 83  88   Resp: 12  15 18   Temp: 98.2 F (36.8 C)  98.4 F (36.9 C) 98.3 F (36.8 C)  TempSrc: Oral  Oral Oral  SpO2:   93% 92%  Weight:      Height:       Physical Exam General: sitting up, comfortable, nontoxic Heart: irreg irreg, syst murmur, no rub Lungs: rales on left, dec BS right, normal WOB Abdomen: soft nontender Extremities: diffuse edema Dialysis Access: LUE AVF post op ecchymosis and dressing  Additional Objective Labs: Basic Metabolic Panel: Recent Labs  Lab 10/30/17 0302 10/31/17 0218 11/01/17 0338  NA 133* 134* 134*  K 4.9 4.7 4.4  CL 99 100 99  CO2 20* 20* 22  GLUCOSE 168* 132* 150*  BUN 158* 162* 163*  CREATININE 8.76* 9.04* 8.69*  CALCIUM 8.1* 8.0* 8.2*  PHOS 5.5* 5.3* 5.8*   Liver Function Tests: Recent Labs  Lab 10/27/17 1126  10/30/17 0302 10/31/17 0218 11/01/17 0338  AST 33  --   --   --   --   ALT 20  --   --   --   --   ALKPHOS 67  --   --   --   --   BILITOT 1.1  --   --   --   --   PROT 5.2*  --   --   --   --   ALBUMIN 2.9*   < > 2.5* 2.4* 2.5*   < > = values in this interval not displayed.   Recent Labs  Lab 10/27/17 1126  LIPASE 21   CBC: Recent Labs  Lab 10/28/17 0259 10/29/17 0322 10/30/17 0302 10/31/17 0218 11/01/17 0338  WBC 15.4* 10.4 11.6* 11.2* 10.3  NEUTROABS 13.2*  --  8.2*  --   --   HGB 9.2* 9.5* 9.6* 9.8* 9.9*  HCT 28.6* 29.3* 29.7* 30.0* 30.8*  MCV 97.3 97.0 95.8 95.5 94.5  PLT 214 214 218 223 258   Blood Culture    Component Value Date/Time   SDES BLOOD RIGHT ANTECUBITAL 10/29/2017 1125   SDES BLOOD RIGHT ANTECUBITAL 10/29/2017 1125   SPECREQUEST  10/29/2017 1125    BOTTLES DRAWN AEROBIC AND ANAEROBIC Blood Culture adequate volume   SPECREQUEST  10/29/2017 1125   BOTTLES DRAWN AEROBIC AND ANAEROBIC Blood Culture adequate volume   CULT  10/29/2017 1125    NO GROWTH 2 DAYS Performed at [facility], [place] [address] [place], [place] [address]    CULT  10/29/2017 1125    NO GROWTH 2 DAYS Performed at [facility], [place] [address], [place], [place] [address]    REPTSTATUS PENDING 10/29/2017 1125   REPTSTATUS PENDING 10/29/2017 1125    Cardiac Enzymes: Recent Labs  Lab 10/28/17 0259  TROPONINI 0.18*   CBG: Recent Labs  Lab 10/31/17 0713 10/31/17 1212 10/31/17 1638 10/31/17 2124 11/01/17 0804  GLUCAP 114* 194* 131* 155* 132*   Iron Studies: No results for input(s): IRON, TIBC, TRANSFERRIN, FERRITIN in the last 72 hours. @lablastinr3 @ Studies/Results: No results found. Medications: . sodium chloride    . furosemide 160 mg (10/31/17 1716)   . allopurinol  50 mg Oral Daily  . ciprofloxacin  500 mg  Oral Daily  . hydrALAZINE  50 mg Oral TID  . insulin aspart  0-9 Units Subcutaneous TID WC  . insulin glargine  16 Units Subcutaneous QHS  . metoprolol tartrate  25 mg Oral BID  . pantoprazole  40 mg Oral QHS  . PARoxetine  40 mg Oral BH-q7a  . rosuvastatin  10 mg Oral QHS  . Warfarin - Pharmacist Dosing Inpatient   Does not apply q1800   Assessment/Plan: 1.    Progressive CKD 5 now ESRD  IR consult for [facility]  Then HD today, #1, gentle start  CLIP, likely to [place]  Has healing 2nd stage BVT  HD#1: 2K, 2h, Qb 200, 0.5L UF, no heparin  2.  Anasarca - Stop diuretics once on HD   3.  Abdominal pain - not present today  4.   Anemia - Hb stable in 9s. CTM for now  5.  Pleural effusion - will see now it goes with ultrafiltration on HD  6.  Diastolic heart failure - diuresing per above.   7.. Atrial fibrillation - on metoprolol, rate controlled; on coumadin dosing per pharmacy.  8.  Enterobacter bacteremia - suspected urinary source. ? cefepine allergy, on cipro now; surveillance Cx NG > 2d  9.  HTN -  normotensive. on hydralazine 50 TID, MTP 25 BID  10.  DM - per primary   11. S/p 2nd stage BVT 10/23/17 with Reina Fuse MD 11/01/2017, 8:59 AM  [place]

## 2017-11-01 NOTE — Procedures (Signed)
RIJV HD cath SVC RA Temp EBL 0 Comp 0

## 2017-11-01 NOTE — Progress Notes (Signed)
Family Medicine Teaching Service Daily Progress Note Intern Pager: [phone]  Patient name: Christine Santos Medical record number: [id number] Date of birth: 1945/11/04 Age: 72 y.o. Gender: female  Primary Care Provider: [name], MD Consultants: nephrology Code Status: full  Pt Overview and Major Events to Date:  Admitted 10/4 10/9 moving forward with cath placement for dialysis. nephro notified.  Assessment and Plan: Christine Santos a 72 y.o.femalewho presented with abdominal pain found to have enterobacter bacteremia. 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  Enterobacter bacteremia Enterobacter cloacae likely from urine. Repeat blood cultures are negative. Cefepime started 10/5.  Switched to oral Cipro 10/7. Afebrile since admission, WBC wnl 10.3 today. - continue Cipro 500mg  qd for total of 10 days Abx treatment (10/7-10/15)  - monitor wbc - Continue home tramadol renal dose - prn zofran - monitor urine output - pharmacy counsel on discharge  Acute hypoxic respiratory failure with B/L pleural effusions Lung sounds clear bilaterally. Patient does not complain of difficulty breathing or difficulty swallowing. Patient was on 1L [place] overnight for desats to high 80s but removed this morning with oxygen saturation >92% while talking. Voice is horse/raspy but improved from yesterday. I have an order in for daily weights and also specifically asked the nurse this morning to get her weight but it has not been obtained today still. I predict her breathing status will improve with fluid status correction. -Appreciate nephrology recs -Continue Lasix to 160 mg twice daily IV -Strict I's and O's -Daily weights - continue to monitor oxygen saturation -No indication for pulm consult at this time - PT/OT   CKD 5  Patient has 3+ pitting edema in bilateral extremities. She also has  significant non-pitting edema in left hand and forearm (improved since yesterday). She is making small amount of urine but unsure if improving or not due to multiple outputs not measured. Today: K+4.4, BUN 163, Cr 8.69. Patient states that she is ready for dialysis and is amenable to moving forward with permcath placement. Status post basilic vein transposition of left upper extremity on 10/23/2017. Patient has INR 2.25 today which is contradindication to permcath placement so patient will have temp. cath placed today in order to initiate dialysis while her graft matures. - Daily renal function panel - continue to monitor STRICT I/O's - f/u nephrology - monitor new temp cath site for infection - dialysis per nephrology  Elevated troponin- improved - no indication for further work-up.   Atrial fibrillation. Stable. INR 2.25 today. Warfarin held last 3 days. - continue home metoprolol - continue warfarin per pharm - discuss warfarin dose prescription for discharge  Hypertension, stable. Overnight had increased BP 157/73 (prior to getting BP meds). I contacted nursing staff to see if we can get another set of vitals since have not recorded them since yesterday. -Continue home hydralazine 50 mg 3 timesdaily and lopressor\ 25 mg twice daily -monitor vitals  Type 2 diabetes, stable. A1c 7.0 this admission.Lantus 16 units plus sliding scale at home. CBG this am 132. -Lantus 16 units -sSSI - monitor CBGs - NPO prior to temp cath placement and renal diet/carb modified after  Hyperlipidemia, stable. -Continue home rosuvastatin 20 mg  Anxiety/depression, stable. - continue home paxil 40 mg.  Pulmonary nodule. Incidentally found on CT. L lower lobe 18mm nodule, increased from 55mm in 2015 with likely increased size from adjacent atelectasis  so thought to be stable and benign. - follow up outpatient   FEN/GI: KVO/ renal diet, carb modified PPx: warfarin being held. SCDs  Disposition: med  surg  Subjective:  Overnight: patient had no acute events. hypertensive  Today: patient states she is feeling better. Her PCP came to see her during my exam. She states that she is ready to move forward with dialysis.  Objective: Temp:  [98 F (36.7 C)-99.6 F (37.6 C)] 98.3 F (36.8 C) (10/08 2326) Pulse Rate:  [82-88] 88 (10/08 1953) Resp:  [12-31] 18 (10/08 2326) BP: (124-157)/(64-111) 157/73 (10/08 2326) SpO2:  [90 %-94 %] 92 % (10/08 2326) Physical Exam: General: patient is laying in bed comfortably, NAD Cardiovascular: irregular rhythm. 3/6 systolic murmur Respiratory: clear to auscultation bilaterally, no increased WOB Abdomen: soft, no tenderness to palpation, no distention  Extremities: 3+ pitting edema in bilateral lower extremities. Significant swelling (non-pitting) in left hand and forearm (improved from yesterday).  Laboratory: Recent Labs  Lab 10/30/17 0302 10/31/17 0218 11/01/17 0338  WBC 11.6* 11.2* 10.3  HGB 9.6* 9.8* 9.9*  HCT 29.7* 30.0* 30.8*  PLT 218 223 258   Recent Labs  Lab 10/27/17 1126  10/30/17 0302 10/31/17 0218 11/01/17 0338  NA 138   < > 133* 134* 134*  K 4.5   < > 4.9 4.7 4.4  CL 101   < > 99 100 99  CO2 20*   < > 20* 20* 22  BUN 129*   < > 158* 162* 163*  CREATININE 7.21*   < > 8.76* 9.04* 8.69*  CALCIUM 9.1   < > 8.1* 8.0* 8.2*  PROT 5.2*  --   --   --   --   BILITOT 1.1  --   --   --   --   ALKPHOS 67  --   --   --   --   ALT 20  --   --   --   --   AST 33  --   --   --   --   GLUCOSE 119*   < > 168* 132* 150*   < > = values in this interval not displayed.    Imaging/Diagnostic Tests: None today  Richarda Osmond, DO 11/01/2017, 7:26 AM PGY-1, [facility] Intern pager: [phone], text pages welcome

## 2017-11-01 NOTE — Progress Notes (Signed)
   Patient Status: [facility] - In-pt  Assessment and Plan: Patient in need of venous access for initiation of dialysis.  Patient does have a LUA fistula that is not yet mature.  She is on coumadin due to recent stroke.  Discussed with ordering service-plan for temp cath today to initiate urgent dialysis, hold coumadin, and plan to convert to tunneled catheter at a later date (coumadin typically a 5 day hold with INR <1.5).  All of the patient's questions were answered, patient is agreeable to proceed. Consent signed and in chart.  ______________________________________________________________________   History of Present Illness: Christine Santos is a 72 y.o. female with past medical history of renal failure, now in need of dialysis.  She has a LUA fistula which is not yet ready for use.  She will likely need conversion to a tunneled cath at a later date once coumadin held.   Allergies and medications reviewed.   Review of Systems: A 12 point ROS discussed and pertinent positives are indicated in the HPI above.  All other systems are negative.  Review of Systems  Constitutional: Negative for fatigue and fever.  Respiratory: Negative for cough and shortness of breath.   Cardiovascular: Negative for chest pain.  Gastrointestinal: Negative for abdominal pain.  Musculoskeletal: Negative for back pain.  Psychiatric/Behavioral: Negative for behavioral problems and confusion.    Vital Signs: BP (!) 157/73 (BP Location: Right Arm)   Pulse 88   Temp 98.3 F (36.8 C) (Oral)   Resp 18   Ht 5' 5.5" (1.664 m)   Wt 164 lb 7.4 oz (74.6 kg)   SpO2 92%   BMI 26.95 kg/m   Physical Exam  Constitutional: She is oriented to person, place, and time. She appears well-developed. No distress.  Neck: Normal range of motion. Neck supple.  Cardiovascular: Normal rate, regular rhythm and normal heart sounds.  Pulmonary/Chest: Effort normal and breath sounds normal. No respiratory distress.    Lymphadenopathy:    She has no cervical adenopathy.  Neurological: She is alert and oriented to person, place, and time.  Skin: Skin is warm and dry.  Psychiatric: She has a normal mood and affect. Her behavior is normal. Judgment and thought content normal.  Nursing note and vitals reviewed.    Imaging reviewed.   Labs:  COAGS: Recent Labs    11/28/16 2108 11/29/16 0230 11/30/16 1655 12/01/16 0402  10/29/17 0322 10/30/17 0302 10/31/17 0218 11/01/17 0338  INR 1.33  --   --   --    < > 2.69 3.02 2.79 2.25  APTT 134* 71* 63* 72*  --   --   --   --   --    < > = values in this interval not displayed.    BMP: Recent Labs    10/29/17 0322 10/30/17 0302 10/31/17 0218 11/01/17 0338  NA 137 133* 134* 134*  K 4.8 4.9 4.7 4.4  CL 102 99 100 99  CO2 22 20* 20* 22  GLUCOSE 151* 168* 132* 150*  BUN 149* 158* 162* 163*  CALCIUM 8.5* 8.1* 8.0* 8.2*  CREATININE 8.58* 8.76* 9.04* 8.69*  GFRNONAA 4* 4* 4* 4*  GFRAA 5* 5* 4* 5*     Electronically Signed: Docia Barrier, PA 11/01/2017, 11:31 AM   I spent a total of 15 minutes in face to face in clinical consultation, greater than 50% of which was counseling/coordinating care for venous access.

## 2017-11-01 NOTE — Progress Notes (Signed)
HD tx completed _0  w/o problem, UF goal met, blood rinsed back, VSS, report called to Wallene Huh, RN

## 2017-11-01 NOTE — Progress Notes (Signed)
Family medicine progress note  Discussed placement of permcath with IR. Patient would need to have warfarin held for 5 days and INR <1.5 to prior to permcath placement. There appears to be slight wiggle room with the 5 day hold. Ok to go ahead and have vascath placed. Will hold warfarin and attempt to get below 1.5. Daily progress note to follow.  Guadalupe Dawn MD PGY-2 Family Medicine Resident

## 2017-11-01 NOTE — Progress Notes (Signed)
HD tx initiated via HD cath w/o problem, bilat ports: pull/push/flush equally w/o problem, VSS, will cont to monitor while on HD tx

## 2017-11-01 NOTE — Progress Notes (Signed)
Physical Therapy Treatment Patient Details Name: Christine Santos MRN: [id number] DOB: [birth year] Today's Date: 11/01/2017    History of Present Illness Pt is a 72 y/o female admitted secondary to abdominal pain. Found to have enterobacter bacteremia. Also presenting with acute hypoxic repiratory failure with bilateral pleural effusions and progressive CKD 5 with likely need to start HD during admission. PMH includes CAD s/p CABG, DM, CKD, HTN, a fib, and PVD.     PT Comments    Patient progressing with therapy, increasing ambulation distance and level of independence. Still reports feeling weaker than baseline and requires RW and chair follow for safety, pt reports limiting factor being her legs giving out from weakness/fatigue. SpO2 88-93% on RA this session, HR 88-98. Agree with HHPT recs.    Follow Up Recommendations  Home health PT;Supervision for mobility/OOB     Equipment Recommendations  None recommended by PT    Recommendations for Other Services OT consult     Precautions / Restrictions Precautions Precautions: Fall Restrictions Weight Bearing Restrictions: No    Mobility  Bed Mobility Overal bed mobility: Needs Assistance Bed Mobility: Sit to Supine       Sit to supine: Supervision      Transfers Overall transfer level: Needs assistance Equipment used: Rolling walker (2 wheeled) Transfers: Sit to/from Stand Sit to Stand: Supervision         General transfer comment: S for safety  Ambulation/Gait Ambulation/Gait assistance: Min guard;Supervision Gait Distance (Feet): 100 Feet Assistive device: Rolling walker (2 wheeled) Gait Pattern/deviations: Step-through pattern;Decreased stride length Gait velocity: decreased   General Gait Details: pt with slow unsteady gait, legs give out after 50' and she needs to sit early on chair, chair folow fo safety. HR 88-98, SpO2 88-94% on RA, mostly supervision    Stairs             Wheelchair Mobility     Modified Rankin (Stroke Patients Only)       Balance Overall balance assessment: Needs assistance Sitting-balance support: No upper extremity supported;Feet supported Sitting balance-Leahy Scale: Good     Standing balance support: Bilateral upper extremity supported;During functional activity Standing balance-Leahy Scale: Poor Standing balance comment: Reliant on BUE support                             Cognition Arousal/Alertness: Awake/alert Behavior During Therapy: WFL for tasks assessed/performed Overall Cognitive Status: Within Functional Limits for tasks assessed                                        Exercises      General Comments        Pertinent Vitals/Pain Pain Assessment: No/denies pain    Home Living                      Prior Function            PT Goals (current goals can now be found in the care plan section) Acute Rehab PT Goals Patient Stated Goal: to keep her strength up  PT Goal Formulation: With patient Time For Goal Achievement: 11/14/17 Potential to Achieve Goals: Good Progress towards PT goals: Progressing toward goals    Frequency    Min 3X/week      PT Plan Current plan remains appropriate    Co-evaluation  AM-PAC PT "6 Clicks" Daily Activity  Outcome Measure  Difficulty turning over in bed (including adjusting bedclothes, sheets and blankets)?: A Little Difficulty moving from lying on back to sitting on the side of the bed? : A Little Difficulty sitting down on and standing up from a chair with arms (e.g., wheelchair, bedside commode, etc,.)?: Unable Help needed moving to and from a bed to chair (including a wheelchair)?: A Little Help needed walking in hospital room?: A Little Help needed climbing 3-5 steps with a railing? : A Lot 6 Click Score: 15    End of Session Equipment Utilized During Treatment: Gait belt Activity Tolerance: Patient limited by  fatigue Patient left: with call bell/phone within reach;with family/visitor present;in chair Nurse Communication: Mobility status PT Visit Diagnosis: Muscle weakness (generalized) (M62.81);Other abnormalities of gait and mobility (R26.89)     Time: 6789-3810 PT Time Calculation (min) (ACUTE ONLY): 25 min  Charges:  $Gait Training: 8-22 mins $Therapeutic Activity: 8-22 mins                     Reinaldo Berber, PT, DPT Acute Rehabilitation Services Pager: [phone] Office: [phone]     Reinaldo Berber 11/01/2017, 10:56 AM

## 2017-11-02 DIAGNOSIS — R7881 Bacteremia: Secondary | ICD-10-CM

## 2017-11-02 DIAGNOSIS — N390 Urinary tract infection, site not specified: Secondary | ICD-10-CM

## 2017-11-02 DIAGNOSIS — R1110 Vomiting, unspecified: Secondary | ICD-10-CM

## 2017-11-02 DIAGNOSIS — R112 Nausea with vomiting, unspecified: Secondary | ICD-10-CM

## 2017-11-02 LAB — HEPATITIS B SURFACE ANTIGEN: Hepatitis B Surface Ag: NEGATIVE

## 2017-11-02 LAB — CBC WITH DIFFERENTIAL/PLATELET
Abs Immature Granulocytes: 0.04 10*3/uL (ref 0.00–0.07)
BASOS ABS: 0 10*3/uL (ref 0.0–0.1)
Basophils Relative: 0 %
EOS ABS: 0.1 10*3/uL (ref 0.0–0.5)
EOS PCT: 1 %
HCT: 30.3 % — ABNORMAL LOW (ref 36.0–46.0)
Hemoglobin: 9.9 g/dL — ABNORMAL LOW (ref 12.0–15.0)
Immature Granulocytes: 0 %
Lymphocytes Relative: 17 %
Lymphs Abs: 1.5 10*3/uL (ref 0.7–4.0)
MCH: 30.7 pg (ref 26.0–34.0)
MCHC: 32.7 g/dL (ref 30.0–36.0)
MCV: 93.8 fL (ref 80.0–100.0)
Monocytes Absolute: 0.6 10*3/uL (ref 0.1–1.0)
Monocytes Relative: 7 %
NRBC: 0 % (ref 0.0–0.2)
Neutro Abs: 6.6 10*3/uL (ref 1.7–7.7)
Neutrophils Relative %: 75 %
Platelets: 230 10*3/uL (ref 150–400)
RBC: 3.23 MIL/uL — ABNORMAL LOW (ref 3.87–5.11)
RDW: 17.5 % — ABNORMAL HIGH (ref 11.5–15.5)
WBC: 8.9 10*3/uL (ref 4.0–10.5)

## 2017-11-02 LAB — RENAL FUNCTION PANEL
ALBUMIN: 2.5 g/dL — AB (ref 3.5–5.0)
Anion gap: 15 (ref 5–15)
BUN: 114 mg/dL — AB (ref 8–23)
CO2: 24 mmol/L (ref 22–32)
CREATININE: 6.74 mg/dL — AB (ref 0.44–1.00)
Calcium: 8.4 mg/dL — ABNORMAL LOW (ref 8.9–10.3)
Chloride: 100 mmol/L (ref 98–111)
GFR calc non Af Amer: 5 mL/min — ABNORMAL LOW (ref >60)
GFR, EST AFRICAN AMERICAN: 6 mL/min — AB (ref >60)
Glucose, Bld: 114 mg/dL — ABNORMAL HIGH (ref 70–99)
PHOSPHORUS: 5.1 mg/dL — AB (ref 2.5–4.6)
Potassium: 3.7 mmol/L (ref 3.5–5.1)
Sodium: 139 mmol/L (ref 135–145)

## 2017-11-02 LAB — GLUCOSE, CAPILLARY
GLUCOSE-CAPILLARY: 162 mg/dL — AB (ref 70–99)
Glucose-Capillary: 89 mg/dL (ref 70–99)
Glucose-Capillary: 134 mg/dL — ABNORMAL HIGH (ref 70–99)
Glucose-Capillary: 239 mg/dL — ABNORMAL HIGH (ref 70–99)

## 2017-11-02 LAB — PROTIME-INR
INR: 2.05
Prothrombin Time: 22.9 seconds — ABNORMAL HIGH (ref 11.4–15.2)

## 2017-11-02 MED ORDER — DARBEPOETIN ALFA 40 MCG/0.4ML IJ SOSY
40.0000 ug | PREFILLED_SYRINGE | INTRAMUSCULAR | Status: DC
  Administered 2017-11-03: 40 ug via INTRAVENOUS

## 2017-11-02 NOTE — Evaluation (Addendum)
Occupational Therapy Evaluation Patient Details Name: Christine Santos MRN: [id number] DOB: Oct 01, 1945 Today's Date: 11/02/2017    History of Present Illness Pt is a 72 y/o female admitted secondary to abdominal pain. Found to have enterobacter bacteremia. Also presenting with acute hypoxic repiratory failure with bilateral pleural effusions and progressive CKD 5 with likely need to start HD during admission. PMH includes CAD s/p CABG, DM, CKD, HTN, a fib, and PVD.    Clinical Impression   This 72 yo female admitted with above presents to acute OT with decreased balance when up on her feet and stating she does feel more sleepy today (1st dialysis treatment yesterday). She is currently a min guard A -setup/S level and normally is Mod I to independent. She will benefit from acute OT with follow up [place].  Sats were 95-98% on RA with HR in Afib    Follow Up Recommendations  Home health OT;Supervision - Intermittent    Equipment Recommendations  None recommended by OT       Precautions / Restrictions Precautions Precautions: Fall Restrictions Weight Bearing Restrictions: No      Mobility Bed Mobility Overal bed mobility: Modified Independent             General bed mobility comments: increased time  Transfers Overall transfer level: Needs assistance Equipment used: Rolling walker (2 wheeled) Transfers: Sit to/from Stand Sit to Stand: Supervision              Balance Overall balance assessment: Needs assistance Sitting-balance support: No upper extremity supported;Feet supported Sitting balance-Leahy Scale: Good     Standing balance support: No upper extremity supported;During functional activity Standing balance-Leahy Scale: Fair Standing balance comment: standing at sink to wash hands                           ADL either performed or assessed with clinical judgement   ADL Overall ADL's : Needs assistance/impaired Eating/Feeding:  Independent;Sitting   Grooming: Supervision/safety;Set up;Standing   Upper Body Bathing: Set up;Sitting   Lower Body Bathing: Set up;Supervison/ safety;Sit to/from stand   Upper Body Dressing : Set up;Sitting   Lower Body Dressing: Sit to/from stand;Supervision/safety;Set up     Toilet Transfer Details (indicate cue type and reason): S stand pivot to BSC, min guard A ambulation to toilet in bathroom Toileting- Clothing Manipulation and Hygiene: Supervision/safety;Sit to/from Nurse, children's Details (indicate cue type and reason): pt has tub shower combo at home with grab bars and seat to sit on (recommended to pt she find out how to keep dialysis graft dry so she can shower)         Vision Patient Visual Report: No change from baseline              Pertinent Vitals/Pain Pain Assessment: No/denies pain     Hand Dominance Right   Extremity/Trunk Assessment Upper Extremity Assessment Upper Extremity Assessment: RUE deficits/detail RUE Deficits / Details: Decreased AROM at shoulder (pre-existing) LUE Deficits / Details: LUE very swollen due to recent dialysis graft (encouraged her to use it and prop it when not is use to help with edema)           Communication Communication Communication: No difficulties   Cognition Arousal/Alertness: Awake/alert Behavior During Therapy: WFL for tasks assessed/performed Overall Cognitive Status: Within Functional Limits for tasks assessed  Home Living Family/patient expects to be discharged to:: Private residence Living Arrangements: Non-relatives/Friends Available Help at Discharge: Friend(s);Available 24 hours/day Type of Home: House Home Access: Stairs to enter [employer] of Steps: 2 Entrance Stairs-Rails: Left Home Layout: One level     Bathroom Shower/Tub: Tub/shower unit;Curtain   Bathroom Toilet: Handicapped height      Home Equipment: Environmental consultant - 2 wheels;Cane - quad;Cane - single point;Shower seat;Grab bars - Dispensing optician - 4 wheels;Bedside commode   Additional Comments: Lives with her friend Abe People who is visually impaired.      Prior Functioning/Environment Level of Independence: Independent        Comments: Reports she was using cane vs RW depending on the day.         OT Problem List: Decreased range of motion;Increased edema      OT Treatment/Interventions: Self-care/ADL training;Balance training;Therapeutic activities;DME and/or AE instruction;Patient/family education    OT Goals(Current goals can be found in the care plan section) Acute Rehab OT Goals Patient Stated Goal: to get stronger OT Goal Formulation: With patient Time For Goal Achievement: 11/16/17 Potential to Achieve Goals: Good  OT Frequency: Min 2X/week              AM-PAC PT "6 Clicks" Daily Activity     Outcome Measure Help from another person eating meals?: None(only needs setup/S) Help from another person taking care of personal grooming?: None(only needs setup/S) Help from another person toileting, which includes using toliet, bedpan, or urinal?: A Little Help from another person bathing (including washing, rinsing, drying)?: None(only needs setup/S) Help from another person to put on and taking off regular upper body clothing?: None(only needs setup/S) Help from another person to put on and taking off regular lower body clothing?: None(only needs setup/S) 6 Click Score: 23   End of Session Equipment Utilized During Treatment: Surveyor, mining Communication: (NT: pt safe to get up to [facility] by herself)  Activity Tolerance: Patient tolerated treatment well Patient left: in bed;with call bell/phone within reach;with family/visitor present  OT Visit Diagnosis: Muscle weakness (generalized) (M62.81)                Time: 0981-1914 OT Time Calculation (min): 33 min Charges:  OT General  Charges $OT Visit: 1 Visit OT Evaluation $OT Eval Moderate Complexity: 1 Mod OT Treatments $Self Care/Home Management : 8-22 mins  [address], OTR/L Acute [employer] Pager [phone] Office [phone]

## 2017-11-02 NOTE — Progress Notes (Signed)
[facility] Progress Note   Subjective:     HD#1 yesterday, tolerated well; 0.5L UF  CLIP in process  No interval events  Objective Vitals:   11/01/17 2322 11/02/17 0417 11/02/17 0609 11/02/17 0754  BP: (!) 142/70 (!) 155/81  (!) 154/81  Pulse:  87  86  Resp:    16  Temp: 98 F (36.7 C) 98 F (36.7 C)  98 F (36.7 C)  TempSrc: Oral Oral  Oral  SpO2:  95%  94%  Weight:   73.5 kg   Height:       Physical Exam General: sitting up, comfortable, nontoxic Heart: irreg irreg, syst murmur, no rub Lungs: rales on left, dec BS right, normal WOB Abdomen: soft nontender Extremities: diffuse edema Dialysis Access: LUE AVF post op ecchymosis and dressing  Additional Objective Labs: Basic Metabolic Panel: Recent Labs  Lab 10/31/17 0218 11/01/17 0338 11/02/17 0237  NA 134* 134* 139  K 4.7 4.4 3.7  CL 100 99 100  CO2 20* 22 24  GLUCOSE 132* 150* 114*  BUN 162* 163* 114*  CREATININE 9.04* 8.69* 6.74*  CALCIUM 8.0* 8.2* 8.4*  PHOS 5.3* 5.8* 5.1*   Liver Function Tests: Recent Labs  Lab 10/27/17 1126  10/31/17 0218 11/01/17 0338 11/01/17 1956 11/02/17 0237  AST 33  --   --   --   --   --   ALT 20  --   --   --  21  --   ALKPHOS 67  --   --   --   --   --   BILITOT 1.1  --   --   --   --   --   PROT 5.2*  --   --   --   --   --   ALBUMIN 2.9*   < > 2.4* 2.5*  --  2.5*   < > = values in this interval not displayed.   Recent Labs  Lab 10/27/17 1126  LIPASE 21   CBC: Recent Labs  Lab 10/28/17 0259 10/29/17 0322 10/30/17 0302 10/31/17 0218 11/01/17 0338 11/02/17 0237  WBC 15.4* 10.4 11.6* 11.2* 10.3 8.9  NEUTROABS 13.2*  --  8.2*  --   --  6.6  HGB 9.2* 9.5* 9.6* 9.8* 9.9* 9.9*  HCT 28.6* 29.3* 29.7* 30.0* 30.8* 30.3*  MCV 97.3 97.0 95.8 95.5 94.5 93.8  PLT 214 214 218 223 258 230   Blood Culture    Component Value Date/Time   SDES BLOOD RIGHT ANTECUBITAL 10/29/2017 1125   SDES BLOOD RIGHT ANTECUBITAL 10/29/2017 1125   SPECREQUEST   10/29/2017 1125    BOTTLES DRAWN AEROBIC AND ANAEROBIC Blood Culture adequate volume   SPECREQUEST  10/29/2017 1125    BOTTLES DRAWN AEROBIC AND ANAEROBIC Blood Culture adequate volume   CULT  10/29/2017 1125    NO GROWTH 4 DAYS Performed at [facility], [place] [address], [place], [place] [address]    CULT  10/29/2017 1125    NO GROWTH 4 DAYS Performed at [facility], [place] [address], [place], [place] [address]    REPTSTATUS PENDING 10/29/2017 1125   REPTSTATUS PENDING 10/29/2017 1125    Cardiac Enzymes: Recent Labs  Lab 10/28/17 0259  TROPONINI 0.18*   CBG: Recent Labs  Lab 11/01/17 1346 11/01/17 1650 11/01/17 2328 11/02/17 0756 11/02/17 1151  GLUCAP 105* 166* 121* 89 134*   Iron Studies: No results for input(s): IRON, TIBC, TRANSFERRIN, FERRITIN in the last  72 hours. @lablastinr3 @ Studies/Results: Ir Fluoro Guide Cv Line Right  Result Date: 11/01/2017 INDICATION: Acute renal failure EXAM: TEMPORARY RIGHT JUGULAR DIALYSIS CATHETER MEDICATIONS: None ANESTHESIA/SEDATION: None FLUOROSCOPY TIME:  Fluoroscopy Time:  minutes 12 seconds (1 mGy). COMPLICATIONS: None immediate. PROCEDURE: Informed written consent was obtained from the patient after a thorough discussion of the procedural risks, benefits and alternatives. All questions were addressed. Maximal Sterile Barrier Technique was utilized including caps, mask, sterile gowns, sterile gloves, sterile drape, hand hygiene and skin antiseptic. A timeout was performed prior to the initiation of the procedure. The right neck was prepped and draped in a sterile fashion. 1% lidocaine was utilized for local anesthesia. Under sonographic guidance, a micropuncture needle was inserted into the right jugular vein and removed over a 018 wire which was up sized to an Amplatz. The jugular vein was noted to be patent. Sonographic documentation was obtained. Twelve [place] dilator followed by a 12 [place] temporary dialysis catheter was  advanced over the wire. It was flushed and sewn in place. FINDINGS: Imaging confirms a temporary right jugular dialysis catheter with its tip at the cavoatrial junction. IMPRESSION: Successful right jugular temporary dialysis catheter with its tip at the cavoatrial junction. Electronically Signed   By: Marybelle Killings M.D.   On: 11/01/2017 14:48   Ir US Guide Vasc Access Right  Result Date: 11/01/2017 INDICATION: Acute renal failure EXAM: TEMPORARY RIGHT JUGULAR DIALYSIS CATHETER MEDICATIONS: None ANESTHESIA/SEDATION: None FLUOROSCOPY TIME:  Fluoroscopy Time:  minutes 12 seconds (1 mGy). COMPLICATIONS: None immediate. PROCEDURE: Informed written consent was obtained from the patient after a thorough discussion of the procedural risks, benefits and alternatives. All questions were addressed. Maximal Sterile Barrier Technique was utilized including caps, mask, sterile gowns, sterile gloves, sterile drape, hand hygiene and skin antiseptic. A timeout was performed prior to the initiation of the procedure. The right neck was prepped and draped in a sterile fashion. 1% lidocaine was utilized for local anesthesia. Under sonographic guidance, a micropuncture needle was inserted into the right jugular vein and removed over a 018 wire which was up sized to an Amplatz. The jugular vein was noted to be patent. Sonographic documentation was obtained. Twelve [place] dilator followed by a 12 [place] temporary dialysis catheter was advanced over the wire. It was flushed and sewn in place. FINDINGS: Imaging confirms a temporary right jugular dialysis catheter with its tip at the cavoatrial junction. IMPRESSION: Successful right jugular temporary dialysis catheter with its tip at the cavoatrial junction. Electronically Signed   By: Marybelle Killings M.D.   On: 11/01/2017 14:48   Medications: . sodium chloride    . sodium chloride    . sodium chloride     . allopurinol  50 mg Oral Daily  . Chlorhexidine Gluconate Cloth  6 each  Topical Q0600  . ciprofloxacin  500 mg Oral Daily  . hydrALAZINE  50 mg Oral TID  . insulin aspart  0-9 Units Subcutaneous TID WC  . insulin glargine  16 Units Subcutaneous QHS  . metoprolol tartrate  25 mg Oral BID  . pantoprazole  40 mg Oral QHS  . PARoxetine  40 mg Oral BH-q7a  . rosuvastatin  10 mg Oral QHS   Assessment/Plan: 1.    Progressive CKD 5 now ESRD  HD# 2 tomorrow: 3K, 3h, Qb 250, 1L UF, no heparin  CLIP, likely to [place]  Has healing 2nd stage BVT  Temp HD cath placed 10/9; will tunnel when INR < 1.5  2.  Anasarca -  cont UF   3.  Abdominal pain - not present today  4.   Anemia - Start ESA with HD, check Fe levels  5.  Pleural effusion - will see now it goes with ultrafiltration on HD  6.  Diastolic heart failure - as above  7.. Atrial fibrillation - on metoprolol, rate controlled; coumadin held.  8.  Enterobacter bacteremia - suspected urinary source. ? cefepine allergy, on cipro now; surveillance Cx NG > 2d  9.  HTN - normotensive. on hydralazine 50 TID, MTP 25 BID  10.  DM - per primary   11. S/p 2nd stage BVT 10/23/17 with [facility]  12. CKD BMD: check P, PTH next HD  Pearson Grippe MD 11/02/2017, 12:03 PM  [facility]

## 2017-11-02 NOTE — Progress Notes (Signed)
Family Medicine Teaching Service Daily Progress Note Intern Pager: [phone]  Patient name: Christine Santos Medical record number: [id number] Date of birth: 04-28-1945 Age: 72 y.o. Gender: female  Primary Care Provider: [name], MD Consultants: nephrology Code Status: full  Pt Overview and Major Events to Date:  Admitted 10/4 10/9 temp cath and initiated HD  Assessment and Plan: Christine Santos a 72 y.o.femalewho presented with abdominal pain found to have enterobacter bacteremia. 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  Enterobacter bacteremia Enterobacter cloacae likely from urine. Repeat blood cultures are negative. Cefepime started 10/5.  Switched to oral Cipro 10/7. Afebrile since admission, WBC wnl 8.9 today. - continue Cipro 500mg  qd for total of 10 days Abx treatment (10/7-10/15)  - monitor wbc - Continue home tramadol renal dose - prn zofran - monitor urine output - pharmacy counsel on discharge  Acute hypoxic respiratory failure with B/L pleural effusions Lung sounds clear bilaterally with mild rhonchi on left upper lobe. Patient does not complain of difficulty breathing or difficulty swallowing. Patient was on room air overnight after dialysis with no significant desaturations. Voice is horse/raspy but improved from yesterday. Weight essentially the same as yesterday. -Appreciate nephrology recs -discontinued lasix now on dialysis. Next tx Friday -Strict I's and O's -Daily weights - continue to monitor oxygen saturation - PT/OT   CKD 5  Patient feels much better s/p dialysis yesterday with temp cath. Patient has 2+ pitting edema in bilateral lower extremities. upper extremity swelling greatly improved. Temp cath site without signs of infection. Today: K+3.7, BUN 114, Cr 6.74. Patient states that she is  amenable to moving forward with permcath placement.  Status post basilic vein transposition of left upper extremity on 10/23/2017. Patient has INR 2.05 today which is contradindication to permcath placement so patient will have temp and have plans to place tunneled cath per nephro. - Daily renal function panel - continue to monitor STRICT I/O's - f/u nephrology - monitor new temp cath site for infection - dialysis per nephrology  Elevated troponin- improved - no indication for further work-up.   Atrial fibrillation. Stable. Heart rate controlled and irregular rhythm on exam. INR 2.05 today. Warfarin held since 10/8. - continue home metoprolol - continue warfarin per pharm - discuss warfarin dose prescription for discharge  Hypertension, stable. Continues to be hypertensive. Most recent BP 154/81 (prior to getting BP meds). -Continue home hydralazine 50 mg 3 timesdaily and lopressor\ 25 mg twice daily -monitor vitals  Type 2 diabetes, stable. A1c 7.0 this admission.Lantus 16 units plus sliding scale at home. CBG this am 114, 89 (fasting). -Lantus 16 units -sSSI - monitor CBGs - renal/ carb modified diet  Hyperlipidemia, stable. -Continue home rosuvastatin 20 mg  Anxiety/depression, stable. - continue home paxil 40 mg.  Pulmonary nodule. Incidentally found on CT. L lower lobe 29mm nodule, increased from 8mm in 2015 with likely increased size from adjacent atelectasis so thought to be stable and benign. - follow up outpatient   FEN/GI: renal diet, carb modified PPx: warfarin being held. SCDs  Disposition: med surg  Subjective:  Overnight: patient had no acute events. Hypertensive. No oxygen desats or supplement  Today: patient states she is feeling well. She states she was more scared of the dialysis than it turned out to be in real life and felt comfortable during it.   Objective: Temp:  [97.9 F (  36.6 C)-98.2 F (36.8 C)] 98 F (36.7 C) (10/10 0417) Pulse Rate:  [84-97] 87 (10/10 0417) Resp:  [13-29] 17 (10/09  2222) BP: (142-163)/(70-88) 155/81 (10/10 0417) SpO2:  [93 %-96 %] 95 % (10/10 0417) Weight:  [61.7 kg-73.8 kg] 73.5 kg (10/10 0609)   Physical Exam: General: patient is sitting in bed comfortably, NAD Cardiovascular: irregular rhythm. 2/6 systolic murmur Respiratory: clear to auscultation bilaterally except for mild rhonchi on expiration upper left lobe, no increased WOB Abdomen: soft, no tenderness to palpation, no distention  Extremities: 2+ pitting edema in bilateral lower extremities. mild swelling (non-pitting) in left hand (improved from yesterday).  Laboratory: Recent Labs  Lab 10/31/17 0218 11/01/17 0338 11/02/17 0237  WBC 11.2* 10.3 8.9  HGB 9.8* 9.9* 9.9*  HCT 30.0* 30.8* 30.3*  PLT 223 258 230   Recent Labs  Lab 10/27/17 1126  10/31/17 0218 11/01/17 0338 11/01/17 1956 11/02/17 0237  NA 138   < > 134* 134*  --  139  K 4.5   < > 4.7 4.4  --  3.7  CL 101   < > 100 99  --  100  CO2 20*   < > 20* 22  --  24  BUN 129*   < > 162* 163*  --  114*  CREATININE 7.21*   < > 9.04* 8.69*  --  6.74*  CALCIUM 9.1   < > 8.0* 8.2*  --  8.4*  PROT 5.2*  --   --   --   --   --   BILITOT 1.1  --   --   --   --   --   ALKPHOS 67  --   --   --   --   --   ALT 20  --   --   --  21  --   AST 33  --   --   --   --   --   GLUCOSE 119*   < > 132* 150*  --  114*   < > = values in this interval not displayed.    Imaging/Diagnostic Tests: None today  Richarda Osmond, DO 11/02/2017, 7:36 AM PGY-1, [place] Intern pager: [phone], text pages welcome

## 2017-11-03 DIAGNOSIS — R102 Pelvic and perineal pain: Secondary | ICD-10-CM

## 2017-11-03 LAB — RENAL FUNCTION PANEL
Albumin: 2.4 g/dL — ABNORMAL LOW (ref 3.5–5.0)
Anion gap: 13 (ref 5–15)
BUN: 127 mg/dL — AB (ref 8–23)
CHLORIDE: 102 mmol/L (ref 98–111)
CO2: 25 mmol/L (ref 22–32)
Calcium: 8.1 mg/dL — ABNORMAL LOW (ref 8.9–10.3)
Creatinine, Ser: 7.26 mg/dL — ABNORMAL HIGH (ref 0.44–1.00)
GFR calc Af Amer: 6 mL/min — ABNORMAL LOW (ref >60)
GFR, EST NON AFRICAN AMERICAN: 5 mL/min — AB (ref >60)
Glucose, Bld: 136 mg/dL — ABNORMAL HIGH (ref 70–99)
POTASSIUM: 3.8 mmol/L (ref 3.5–5.1)
Phosphorus: 5.4 mg/dL — ABNORMAL HIGH (ref 2.5–4.6)
SODIUM: 140 mmol/L (ref 135–145)

## 2017-11-03 LAB — CBC
HCT: 30.4 % — ABNORMAL LOW (ref 36.0–46.0)
Hemoglobin: 9.4 g/dL — ABNORMAL LOW (ref 12.0–15.0)
MCH: 29.9 pg (ref 26.0–34.0)
MCHC: 30.9 g/dL (ref 30.0–36.0)
MCV: 96.8 fL (ref 80.0–100.0)
Platelets: 230 10*3/uL (ref 150–400)
RBC: 3.14 MIL/uL — ABNORMAL LOW (ref 3.87–5.11)
RDW: 18 % — ABNORMAL HIGH (ref 11.5–15.5)
WBC: 10.7 10*3/uL — ABNORMAL HIGH (ref 4.0–10.5)
nRBC: 0 % (ref 0.0–0.2)

## 2017-11-03 LAB — CULTURE, BLOOD (ROUTINE X 2)
Culture: NO GROWTH
Culture: NO GROWTH
SPECIAL REQUESTS: ADEQUATE
Special Requests: ADEQUATE

## 2017-11-03 LAB — GLUCOSE, CAPILLARY
GLUCOSE-CAPILLARY: 96 mg/dL (ref 70–99)
GLUCOSE-CAPILLARY: 257 mg/dL — AB (ref 70–99)
Glucose-Capillary: 152 mg/dL — ABNORMAL HIGH (ref 70–99)

## 2017-11-03 LAB — PHOSPHORUS: Phosphorus: 4.5 mg/dL (ref 2.5–4.6)

## 2017-11-03 LAB — TRANSFERRIN: Transferrin: 188 mg/dL — ABNORMAL LOW (ref 192–382)

## 2017-11-03 LAB — PROTIME-INR
INR: 1.7
Prothrombin Time: 19.9 s — ABNORMAL HIGH (ref 11.4–15.2)

## 2017-11-03 LAB — HEPATITIS B CORE ANTIBODY, TOTAL: Hep B Core Total Ab: NEGATIVE

## 2017-11-03 LAB — FERRITIN: Ferritin: 278 ng/mL (ref 11–307)

## 2017-11-03 LAB — HEPATITIS B SURFACE ANTIBODY,QUALITATIVE: Hep B S Ab: NONREACTIVE

## 2017-11-03 MED ORDER — HEPARIN SODIUM (PORCINE) 1000 UNIT/ML DIALYSIS: 20.0000 [IU]/kg | INTRAMUSCULAR | Status: DC | PRN

## 2017-11-03 MED ORDER — NON FORMULARY: 3.0000 mg | Freq: Every day | Status: DC

## 2017-11-03 MED ORDER — MELATONIN 3 MG PO TABS
3.0000 mg | ORAL_TABLET | Freq: Every day | ORAL | Status: DC
  Administered 2017-11-04 (×2): 3 mg via ORAL
  Filled 2017-11-03 (×2): qty 1

## 2017-11-03 MED ORDER — DARBEPOETIN ALFA 40 MCG/0.4ML IJ SOSY
PREFILLED_SYRINGE | INTRAMUSCULAR | Status: AC
  Administered 2017-11-03: 40 ug via INTRAVENOUS
  Filled 2017-11-03: qty 0.4

## 2017-11-03 MED ORDER — HEPARIN SODIUM (PORCINE) 1000 UNIT/ML IJ SOLN
INTRAMUSCULAR | Status: AC
  Filled 2017-11-03: qty 3

## 2017-11-03 NOTE — Progress Notes (Addendum)
Family Medicine Teaching Service Daily Progress Note Intern Pager: [phone]  Patient name: Christine Santos Medical record number: [id number] Date of birth: [birth year] Age: 72 y.o. Gender: female  Primary Care Provider: [name], MD Consultants: nephrology Code Status: full  Pt Overview and Major Events to Date:  Admitted 10/4 10/9 temp cath and initiated HD  Assessment and Plan: Christine Santos a 72 y.o.femalewho presented with abdominal pain found to have enterobacter bacteremia. 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  Ovarian cyst- initially seen on CT and believe to have spontaneously ruptured as was not seen on subsequent imaging. This finding is abnormal in post-menopausal woman so ordered CA-125 yesterday results pending - f/u CA 125  Enterobacter bacteremia Enterobacter cloacae likely from urine. Repeat blood cultures are negative. Cefepime started 10/5.  Switched to oral Cipro 10/7. Afebrile since admission, WBC wnl and not trended today. - continue Cipro 500mg  qd for total of 10 days Abx treatment (10/7-10/15)  - Continue home tramadol renal dose - prn zofran - monitor urine output - pharmacy counsel on discharge   CKD - patient feels well today. She is ready for dialysis today. Fluid status seems much improved clinically with no oxygen requirement or desaturation.  Patient has 2+ pitting edema in bilateral lower extremities. Non-pitting upper extremity swelling on left. Temp cath site without signs of infection. Today: K+3.8, BUN 127, Cr 7.26. Patient is really looking forward to getting permcath over with so she can go home for further dialysis. However, her INR is 1.7 today which is still above the threshold. We will continue to hold her warfarin. Status post basilic vein transposition of left upper extremity on 10/23/2017. Will continue to follow up with nephro  recs - Daily renal function panel - continue to monitor STRICT I/O's - f/u nephrology - monitor new temp cath site for infection - dialysis per nephrology - daily weights - monitor oxygen status - PT/OT  Elevated troponin- improved - no indication for further work-up.   Atrial fibrillation. Stable. Heart rate controlled and irregular rhythm on exam. INR 1.7 today. Warfarin held since 10/8. - continue home metoprolol - continue warfarin hold for permcath placement - discuss warfarin dose prescription for discharge  Hypertension, stable. Continues to be hypertensive. Most recent BP 159/87 (prior to getting BP meds). -Continue home hydralazine 50 mg 3 timesdaily and lopressor\ 25 mg twice daily -monitor vitals  Type 2 diabetes, stable. A1c 7.0 this admission.Lantus 16 units plus sliding scale at home. CBG this am 136, 96 (fasting). -Lantus 16 units -sSSI - monitor CBGs - renal/ carb modified diet  Hyperlipidemia, stable. -Continue home rosuvastatin 20 mg  Anxiety/depression, stable. - continue home paxil 40 mg.  Pulmonary nodule. Incidentally found on CT. L lower lobe 61mm nodule, increased from 69mm in 2015 with likely increased size from adjacent atelectasis so thought to be stable and benign. - follow up outpatient   FEN/GI: renal diet, carb modified PPx: warfarin being held. SCDs  Disposition: med surg  Subjective:  Overnight: patient had no acute events. Hypertensive. No oxygen desats or supplement  Today: patient states she is feeling well. She is excited to get dialysis over and wants to be able to get her permcath so she can continue dialysis outpatient. She was disappointed to learn that she could not get it today based on her elevated INR which is still improving.  Objective: Temp:  [97.4 F (36.3 C)-98.3 F (36.8 C)] 97.4 F (36.3 C) (10/11 0752) Pulse Rate:  [85-101] 85 (10/11 0752) Resp:  [19-20] 20 (10/11 0752) BP: (136-159)/(62-87) 159/87 (10/11  0752) SpO2:  [96 %-97 %] 96 % (10/11 0752) Weight:  [72.1 kg] 72.1 kg (10/11 0350)   Physical Exam: General: patient is standing throughout encounter comfortably for PT, NAD Cardiovascular: irregular rhythm. 2/6 systolic murmur Respiratory: clear to auscultation bilaterally, no increased WOB Abdomen: soft, no tenderness to palpation, no distention  Extremities: 3+ pitting edema in bilateral lower extremities (worsened from yesterday but is also standing during exam today). moderate swelling (non-pitting) in left hand (worsened from yesterday).  Laboratory: Recent Labs  Lab 10/31/17 0218 11/01/17 0338 11/02/17 0237  WBC 11.2* 10.3 8.9  HGB 9.8* 9.9* 9.9*  HCT 30.0* 30.8* 30.3*  PLT 223 258 230   Recent Labs  Lab 10/27/17 1126  11/01/17 0338 11/01/17 1956 11/02/17 0237 11/03/17 0328  NA 138   < > 134*  --  139 140  K 4.5   < > 4.4  --  3.7 3.8  CL 101   < > 99  --  100 102  CO2 20*   < > 22  --  24 25  BUN 129*   < > 163*  --  114* 127*  CREATININE 7.21*   < > 8.69*  --  6.74* 7.26*  CALCIUM 9.1   < > 8.2*  --  8.4* 8.1*  PROT 5.2*  --   --   --   --   --   BILITOT 1.1  --   --   --   --   --   ALKPHOS 67  --   --   --   --   --   ALT 20  --   --  21  --   --   AST 33  --   --   --   --   --   GLUCOSE 119*   < > 150*  --  114* 136*   < > = values in this interval not displayed.    Imaging/Diagnostic Tests: None today  Doristine Mango DO 11/03/2017, 8:54 AM PGY-1, [place] Intern pager: [phone], text pages welcome

## 2017-11-03 NOTE — Progress Notes (Signed)
[facility] Progress Note   Subjective:      CLIP to [place] [facility] 2nd Shift  No interval events  INR 1.7  Objective Vitals:   11/03/17 1045 11/03/17 1100 11/03/17 1130 11/03/17 1200  BP: (!) 155/78 (!) 146/70 (!) 154/73 (!) 140/58  Pulse: 94 76 84 97  Resp: 16 16    Temp: 97.6 F (36.4 C)     TempSrc: Oral     SpO2:  96%    Weight: 72.7 kg     Height:       Physical Exam General: sitting up, comfortable, nontoxic Heart: irreg irreg, syst murmur, no rub Lungs: rales on left, dec BS right, normal WOB Abdomen: soft nontender Extremities: diffuse edema Dialysis Access: LUE AVF post op ecchymosis and dressing R IJ TEMP HD Cath present  Additional Objective Labs: Basic Metabolic Panel: Recent Labs  Lab 11/01/17 0338 11/02/17 0237 11/03/17 0328 11/03/17 1121  NA 134* 139 140  --   K 4.4 3.7 3.8  --   CL 99 100 102  --   CO2 22 24 25   --   GLUCOSE 150* 114* 136*  --   BUN 163* 114* 127*  --   CREATININE 8.69* 6.74* 7.26*  --   CALCIUM 8.2* 8.4* 8.1*  --   PHOS 5.8* 5.1* 5.4* 4.5   Liver Function Tests: Recent Labs  Lab 11/01/17 0338 11/01/17 1956 11/02/17 0237 11/03/17 0328  ALT  --  21  --   --   ALBUMIN 2.5*  --  2.5* 2.4*   No results for input(s): LIPASE, AMYLASE in the last 168 hours. CBC: Recent Labs  Lab 10/28/17 0259  10/30/17 0302 10/31/17 0218 11/01/17 0338 11/02/17 0237 11/03/17 1057  WBC 15.4*   < > 11.6* 11.2* 10.3 8.9 10.7*  NEUTROABS 13.2*  --  8.2*  --   --  6.6  --   HGB 9.2*   < > 9.6* 9.8* 9.9* 9.9* 9.4*  HCT 28.6*   < > 29.7* 30.0* 30.8* 30.3* 30.4*  MCV 97.3   < > 95.8 95.5 94.5 93.8 96.8  PLT 214   < > 218 223 258 230 230   < > = values in this interval not displayed.   Blood Culture    Component Value Date/Time   SDES BLOOD RIGHT ANTECUBITAL 10/29/2017 1125   SDES BLOOD RIGHT ANTECUBITAL 10/29/2017 1125   SPECREQUEST  10/29/2017 1125    BOTTLES DRAWN AEROBIC AND ANAEROBIC Blood Culture adequate  volume   SPECREQUEST  10/29/2017 1125    BOTTLES DRAWN AEROBIC AND ANAEROBIC Blood Culture adequate volume   CULT  10/29/2017 1125    NO GROWTH 4 DAYS Performed at [facility], [place] [address], [place], [place] [address]    CULT  10/29/2017 1125    NO GROWTH 4 DAYS Performed at [facility], [place] [address] [place], [place] [address]    REPTSTATUS PENDING 10/29/2017 1125   REPTSTATUS PENDING 10/29/2017 1125    Cardiac Enzymes: Recent Labs  Lab 10/28/17 0259  TROPONINI 0.18*   CBG: Recent Labs  Lab 11/02/17 0756 11/02/17 1151 11/02/17 1650 11/02/17 2239 11/03/17 0748  GLUCAP 89 134* 162* 239* 96   Iron Studies: No results for input(s): IRON, TIBC, TRANSFERRIN, FERRITIN in the last 72 hours. @lablastinr3 @ Studies/Results: No results found. Medications: . sodium chloride    . sodium chloride    . sodium chloride     . allopurinol  50  mg Oral Daily  . Chlorhexidine Gluconate Cloth  6 each Topical Q0600  . ciprofloxacin  500 mg Oral Daily  . darbepoetin (ARANESP) injection - DIALYSIS  40 mcg Intravenous Q Fri-HD  . hydrALAZINE  50 mg Oral TID  . insulin aspart  0-9 Units Subcutaneous TID WC  . insulin glargine  16 Units Subcutaneous QHS  . metoprolol tartrate  25 mg Oral BID  . pantoprazole  40 mg Oral QHS  . PARoxetine  40 mg Oral BH-q7a  . rosuvastatin  10 mg Oral QHS   Assessment/Plan: 1.    Progressive CKD 5 now ESRD  HD# 2 today: 3K, 3h, Qb 250, 1L UF, no heparin  Has Chair [facility] 2nd Shift at [facility]  Has healing 2nd stage BVT  Temp HD cath placed 10/9; will tunnel when INR < 1.5  2.  Anasarca - cont UF    3.  Abdominal pain - not present today  4.   Anemia - Started ESA with HD, check Fe levels  5.  Pleural effusion - will see now it goes with ultrafiltration on HD  6.  Diastolic heart failure - as above  7.. Atrial fibrillation - on metoprolol, rate controlled; coumadin held.  8.  Enterobacter bacteremia - suspected urinary  source. ? cefepine allergy, on cipro now; surveillance Cx NG > 2d  9.  HTN - normotensive. on hydralazine 50 TID, MTP 25 BID  10.  DM - per primary   11. S/p 2nd stage BVT 10/23/17 with [facility]  12. CKD BMD: P at goal, PTH next HD  Pearson Grippe MD 11/03/2017, 12:35 PM  [place]

## 2017-11-03 NOTE — Progress Notes (Signed)
[place] treatment Tuesday,October 15,2019 at 12:40pm .Schedule FX:OVANVBT Thursday,Saturday at 12:40pm

## 2017-11-03 NOTE — Progress Notes (Signed)
Physical Therapy Treatment Patient Details Name: Christine Santos MRN: [id number] DOB: 1945-08-31 Today's Date: 11/03/2017    History of Present Illness Pt is a 72 y/o female admitted secondary to abdominal pain. Found to have enterobacter bacteremia. Also presenting with acute hypoxic repiratory failure with bilateral pleural effusions and progressive CKD 5 with likely need to start HD during admission. PMH includes CAD s/p CABG, DM, CKD, HTN, a fib, and PVD.     PT Comments    Patient progressing well with therapy today, increasing ambulation distance and level of independence. Pt now with less rest breaks and improved gait velocity. SpO2 88-92% on RA, DOE 1/4, resolves with rest break. Cont to agree with HHPT recs for after d/c.    Follow Up Recommendations  Home health PT;Supervision for mobility/OOB     Equipment Recommendations  None recommended by PT    Recommendations for Other Services OT consult     Precautions / Restrictions Precautions Precautions: Fall Restrictions Weight Bearing Restrictions: No    Mobility  Bed Mobility Overal bed mobility: Modified Independent Bed Mobility: Sit to Supine       Sit to supine: Supervision   General bed mobility comments: increased time  Transfers Overall transfer level: Needs assistance Equipment used: Rolling walker (2 wheeled) Transfers: Sit to/from Stand Sit to Stand: Supervision            Ambulation/Gait Ambulation/Gait assistance: Supervision Gait Distance (Feet): 175 Feet Assistive device: Rolling walker (2 wheeled) Gait Pattern/deviations: Step-through pattern;Decreased stride length Gait velocity: decreased   General Gait Details: pt with increased velocity, further distance and less rest beraks.    Stairs             Wheelchair Mobility    Modified Rankin (Stroke Patients Only)       Balance Overall balance assessment: Needs assistance Sitting-balance support: No upper extremity  supported;Feet supported Sitting balance-Leahy Scale: Good     Standing balance support: No upper extremity supported;During functional activity Standing balance-Leahy Scale: Fair Standing balance comment: standing at sink to wash hands                            Cognition Arousal/Alertness: Awake/alert Behavior During Therapy: WFL for tasks assessed/performed Overall Cognitive Status: Within Functional Limits for tasks assessed                                        Exercises      General Comments        Pertinent Vitals/Pain Pain Assessment: No/denies pain    Home Living                      Prior Function            PT Goals (current goals can now be found in the care plan section) Acute Rehab PT Goals Patient Stated Goal: to get stronger PT Goal Formulation: With patient Time For Goal Achievement: 11/14/17 Potential to Achieve Goals: Good Progress towards PT goals: Progressing toward goals    Frequency    Min 3X/week      PT Plan Current plan remains appropriate    Co-evaluation              AM-PAC PT "6 Clicks" Daily Activity  Outcome Measure  Difficulty turning over in bed (including adjusting bedclothes, sheets  and blankets)?: A Little Difficulty moving from lying on back to sitting on the side of the bed? : A Little Difficulty sitting down on and standing up from a chair with arms (e.g., wheelchair, bedside commode, etc,.)?: Unable Help needed moving to and from a bed to chair (including a wheelchair)?: A Little Help needed walking in hospital room?: A Little Help needed climbing 3-5 steps with a railing? : A Lot 6 Click Score: 15    End of Session Equipment Utilized During Treatment: Gait belt Activity Tolerance: Patient limited by fatigue Patient left: with call bell/phone within reach;with family/visitor present;in chair Nurse Communication: Mobility status PT Visit Diagnosis: Muscle weakness  (generalized) (M62.81);Other abnormalities of gait and mobility (R26.89)     Time: 0830-0900 PT Time Calculation (min) (ACUTE ONLY): 30 min  Charges:  $Gait Training: 23-37 mins                     Reinaldo Berber, PT, DPT Acute Rehabilitation Services Pager: [phone] Office: [phone]     Reinaldo Berber 11/03/2017, 11:00 AM

## 2017-11-04 LAB — GLUCOSE, CAPILLARY
GLUCOSE-CAPILLARY: 123 mg/dL — AB (ref 70–99)
Glucose-Capillary: 129 mg/dL — ABNORMAL HIGH (ref 70–99)
Glucose-Capillary: 231 mg/dL — ABNORMAL HIGH (ref 70–99)
Glucose-Capillary: 165 mg/dL — ABNORMAL HIGH (ref 70–99)

## 2017-11-04 LAB — RENAL FUNCTION PANEL
ALBUMIN: 2.4 g/dL — AB (ref 3.5–5.0)
Anion gap: 10 (ref 5–15)
BUN: 64 mg/dL — ABNORMAL HIGH (ref 8–23)
CALCIUM: 8 mg/dL — AB (ref 8.9–10.3)
CO2: 26 mmol/L (ref 22–32)
CREATININE: 5.13 mg/dL — AB (ref 0.44–1.00)
Chloride: 102 mmol/L (ref 98–111)
GFR calc Af Amer: 9 mL/min — ABNORMAL LOW (ref >60)
GFR calc non Af Amer: 8 mL/min — ABNORMAL LOW (ref >60)
GLUCOSE: 172 mg/dL — AB (ref 70–99)
Phosphorus: 3.3 mg/dL (ref 2.5–4.6)
Potassium: 4.1 mmol/L (ref 3.5–5.1)
SODIUM: 138 mmol/L (ref 135–145)

## 2017-11-04 LAB — CBC WITH DIFFERENTIAL/PLATELET
Abs Immature Granulocytes: 0.07 10*3/uL (ref 0.00–0.07)
Basophils Absolute: 0 10*3/uL (ref 0.0–0.1)
Basophils Relative: 0 %
EOS ABS: 0.1 10*3/uL (ref 0.0–0.5)
EOS PCT: 1 %
HEMATOCRIT: 29.7 % — AB (ref 36.0–46.0)
Hemoglobin: 9.5 g/dL — ABNORMAL LOW (ref 12.0–15.0)
Immature Granulocytes: 1 %
LYMPHS ABS: 2.3 10*3/uL (ref 0.7–4.0)
Lymphocytes Relative: 23 %
MCH: 30.8 pg (ref 26.0–34.0)
MCHC: 32 g/dL (ref 30.0–36.0)
MCV: 96.4 fL (ref 80.0–100.0)
MONO ABS: 0.9 10*3/uL (ref 0.1–1.0)
MONOS PCT: 9 %
Neutro Abs: 6.6 10*3/uL (ref 1.7–7.7)
Neutrophils Relative %: 66 %
Platelets: 201 10*3/uL (ref 150–400)
RBC: 3.08 MIL/uL — ABNORMAL LOW (ref 3.87–5.11)
RDW: 18 % — AB (ref 11.5–15.5)
WBC: 10 10*3/uL (ref 4.0–10.5)
nRBC: 0 % (ref 0.0–0.2)

## 2017-11-04 LAB — IRON AND TIBC
IRON: 29 ug/dL (ref 28–170)
Saturation Ratios: 11 % (ref 10.4–31.8)
TIBC: 272 ug/dL (ref 250–450)
UIBC: 243 ug/dL

## 2017-11-04 LAB — PROTIME-INR
INR: 1.48
Prothrombin Time: 17.8 seconds — ABNORMAL HIGH (ref 11.4–15.2)

## 2017-11-04 LAB — CA 125: Cancer Antigen (CA) 125: 243 U/mL — ABNORMAL HIGH (ref 0.0–38.1)

## 2017-11-04 LAB — PARATHYROID HORMONE, INTACT (NO CA): PTH: 146 pg/mL — AB (ref 15–65)

## 2017-11-04 MED ORDER — VANCOMYCIN HCL IN DEXTROSE 1-5 GM/200ML-% IV SOLN
1000.0000 mg | INTRAVENOUS | Status: AC
  Administered 2017-11-05: 1000 mg via INTRAVENOUS
  Filled 2017-11-04: qty 200

## 2017-11-04 NOTE — Progress Notes (Signed)
[facility] Progress Note   Subjective:      CLIP to [place] [facility] 2nd Shift  No interval events  HD #2 yesterday: Tolerated well, 1 L ultrafiltration  INR 1.48  Objective Vitals:   11/03/17 2334 11/04/17 0300 11/04/17 0447 11/04/17 0759  BP: (!) 141/78 140/74  132/72  Pulse: (!) 110 84  90  Resp: 20   16  Temp: 98.4 F (36.9 C)   98 F (36.7 C)  TempSrc: Oral   Oral  SpO2: 96%   96%  Weight:   72.3 kg   Height:       Physical Exam General: sitting up, comfortable, nontoxic Heart: irreg irreg, syst murmur, no rub Lungs: rales on left, dec BS right, normal WOB Abdomen: soft nontender Extremities: diffuse edema Dialysis Access: LUE AVF post op ecchymosis and dressing R IJ TEMP HD Cath present  Additional Objective Labs: Basic Metabolic Panel: Recent Labs  Lab 11/02/17 0237 11/03/17 0328 11/03/17 1121 11/04/17 0243  NA 139 140  --  138  K 3.7 3.8  --  4.1  CL 100 102  --  102  CO2 24 25  --  26  GLUCOSE 114* 136*  --  172*  BUN 114* 127*  --  64*  CREATININE 6.74* 7.26*  --  5.13*  CALCIUM 8.4* 8.1*  --  8.0*  PHOS 5.1* 5.4* 4.5 3.3   Liver Function Tests: Recent Labs  Lab 11/01/17 1956 11/02/17 0237 11/03/17 0328 11/04/17 0243  ALT 21  --   --   --   ALBUMIN  --  2.5* 2.4* 2.4*   No results for input(s): LIPASE, AMYLASE in the last 168 hours. CBC: Recent Labs  Lab 10/30/17 0302 10/31/17 0218 11/01/17 0338 11/02/17 0237 11/03/17 1057 11/04/17 0243  WBC 11.6* 11.2* 10.3 8.9 10.7* 10.0  NEUTROABS 8.2*  --   --  6.6  --  6.6  HGB 9.6* 9.8* 9.9* 9.9* 9.4* 9.5*  HCT 29.7* 30.0* 30.8* 30.3* 30.4* 29.7*  MCV 95.8 95.5 94.5 93.8 96.8 96.4  PLT 218 223 258 230 230 201   Blood Culture    Component Value Date/Time   SDES BLOOD RIGHT ANTECUBITAL 10/29/2017 1125   SDES BLOOD RIGHT ANTECUBITAL 10/29/2017 1125   SPECREQUEST  10/29/2017 1125    BOTTLES DRAWN AEROBIC AND ANAEROBIC Blood Culture adequate volume   SPECREQUEST   10/29/2017 1125    BOTTLES DRAWN AEROBIC AND ANAEROBIC Blood Culture adequate volume   CULT  10/29/2017 1125    NO GROWTH 5 DAYS Performed at [facility], [place] [address] [place], [place] [address]    CULT  10/29/2017 1125    NO GROWTH 5 DAYS Performed at [facility], [place] [address] [place],  [address]    REPTSTATUS 11/03/2017 FINAL 10/29/2017 1125   REPTSTATUS 11/03/2017 FINAL 10/29/2017 1125    Cardiac Enzymes: No results for input(s): CKTOTAL, CKMB, CKMBINDEX, TROPONINI in the last 168 hours. CBG: Recent Labs  Lab 11/02/17 2239 11/03/17 0748 11/03/17 1719 11/03/17 2134 11/04/17 0759  GLUCAP 239* 96 152* 257* 123*   Iron Studies:  Recent Labs    11/03/17 1121  TRANSFERRIN 188*  FERRITIN 278   @[email or web address] @ Studies/Results: No results found. Medications: . sodium chloride     . allopurinol  50 mg Oral Daily  . Chlorhexidine Gluconate Cloth  6 each Topical Q0600  . ciprofloxacin  500 mg Oral Daily  . darbepoetin (ARANESP) injection - DIALYSIS  40  mcg Intravenous Q Fri-HD  . hydrALAZINE  50 mg Oral TID  . insulin aspart  0-9 Units Subcutaneous TID WC  . insulin glargine  16 Units Subcutaneous QHS  . Melatonin  3 mg Oral QHS  . metoprolol tartrate  25 mg Oral BID  . pantoprazole  40 mg Oral QHS  . PARoxetine  40 mg Oral BH-q7a  . rosuvastatin  10 mg Oral QHS   Assessment/Plan: 1.    Progressive CKD 5 now ESRD  Has Chair THS 2nd Shift at [facility]  Has healing 2nd stage BVT  Temp HD cath placed 10/9; will tunnel when INR < 1.5; possibly today  2.  Anasarca - cont UF and serially lowering post HD weight  4.   Anemia - Started ESA with HD, ferritin 278, needs iron saturation; try to add on  5.  Pleural effusion - will see now it goes with ultrafiltration on HD  6.  Diastolic heart failure - as above  7.. Atrial fibrillation - on metoprolol, rate controlled; coumadin held.  8.  Enterobacter bacteremia - suspected urinary  source. ? cefepine allergy, on cipro now; surveillance Cx NG at 5d  9.  HTN - normotensive. on hydralazine 50 TID, MTP 25 BID  10.  DM - per primary   11. S/p 2nd stage BVT 10/23/17 with [facility]  12. CKD BMD: P at goal, PTH 146, no medications indicated  If patient receives tunneled dialysis catheter today, can discharge to first treatment on Tuesday 10/15 at [facility] MD 11/04/2017, 10:31 AM  [place]

## 2017-11-04 NOTE — Progress Notes (Signed)
Referring Physician(s): Dr. Joelyn Oms  Supervising Physician: Markus Daft  Patient Status:  [facility] - In-pt  Chief Complaint: Renal failure  Subjective: Patient known to IR from recent temporary dialysis catheter placement 11/02/10.  She has been able to tolerate initiation of HD and now request is made for tunneled access for discharge.  Patient's INR 1.48 today after holding coumadin.   Allergies: Cefepime; Other; Amoxicillin; Codeine phosphate; and Penicillins  Medications: Prior to Admission medications   Medication Sig Start Date End Date Taking? Authorizing Provider  allopurinol (ZYLOPRIM) 100 MG tablet TAKE 1 TABLET(100 MG) BY MOUTH DAILY Patient taking differently: Take 100 mg by mouth daily.  10/17/17  Yes Hensel, Jamal Collin, MD  Ascorbic Acid (VITAMIN C) 100 MG tablet Take 100 mg by mouth daily.   Yes [provider]  carboxymethylcellulose (REFRESH PLUS) 0.5 % SOLN Place 1 drop into both eyes 3 (three) times daily as needed (dry eyes).    Yes [provider]  ferrous sulfate 325 (65 FE) MG tablet Take 1 tablet (325 mg total) by mouth daily with breakfast. 05/23/13  Yes Hensel, Jamal Collin, MD  GNP GARLIC EXTRACT PO Take 1 tablet by mouth daily.    Yes [provider]  HUMALOG KWIKPEN 100 UNIT/ML KiwkPen Inject 5 Units as directed 3 (three) times daily after meals.  11/03/16  Yes [provider]  hydrALAZINE (APRESOLINE) 50 MG tablet TAKE 1 TABLET BY MOUTH THREE TIMES DAILY Patient taking differently: Take 50 mg by mouth 3 (three) times daily.  10/18/17  Yes [name], MD  LANTUS SOLOSTAR 100 UNIT/ML Solostar Pen ADMINISTER 35 UNITS UNDER THE SKIN DAILY Patient taking differently: Inject 16 Units into the skin at bedtime.  10/02/17  Yes Hensel, Jamal Collin, MD  Melatonin 10 MG CAPS Take 10 mg by mouth at bedtime as needed (sleep).    Yes [provider]  metoprolol tartrate (LOPRESSOR) 25 MG tablet TAKE 1 TABLET BY MOUTH TWICE  DAILY Patient taking differently: Take 25 mg by mouth 2 (two) times daily.  12/07/16  Yes Hensel, Jamal Collin, MD  Multiple Vitamins-Minerals (EYE VITAMINS PO) Take 1 capsule by mouth daily. Taking one daily from her opthamologist (Dr. Herbert Deaner)    Yes [provider]  Multiple Vitamins-Minerals (MULTIVITAMINS THER. W/MINERALS) TABS Take 1 tablet by mouth daily.     Yes [provider]  omega-3 acid ethyl esters (LOVAZA) 1 g capsule Take 2 g by mouth 2 (two) times daily.    Yes [provider]  ondansetron (ZOFRAN) 4 MG tablet Take 1 tablet (4 mg total) by mouth every 8 (eight) hours as needed for nausea or vomiting. 10/11/17  Yes Carlyle Basques, MD  pantoprazole (PROTONIX) 40 MG tablet TAKE 1 TABLET BY MOUTH DAILY Patient taking differently: Take 40 mg by mouth at bedtime.  05/29/17  Yes [name], MD  PARoxetine (PAXIL) 40 MG tablet Take 1 tablet (40 mg total) by mouth every morning. 10/18/17  Yes Hensel, Jamal Collin, MD  rosuvastatin (CRESTOR) 20 MG tablet Take 1 tablet (20 mg total) by mouth daily. Patient taking differently: Take 20 mg by mouth at bedtime.  10/18/17  Yes Hensel, Jamal Collin, MD  torsemide (DEMADEX) 100 MG tablet Take 1 tablet (100 mg total) by mouth daily. 02/16/17  Yes Hensel, Jamal Collin, MD  traMADol (ULTRAM) 50 MG tablet Take 1 tablet (50 mg total) by mouth every 6 (six) hours as needed (for pain.). Patient taking differently: Take 100 mg  by mouth every 6 (six) hours as needed (for pain.).  10/23/17  Yes Ulyses Amor, PA-C  warfarin (COUMADIN) 5 MG tablet Take 1 tablet (5 mg total) by mouth daily. Patient taking differently: Take 5 mg by mouth See admin instructions. Goes to the clinic every Monday    Take 0.5 tablet (2.5 mg) by mouth on Sundays, Tuesdays, Wednesdays, Thursdays, Saturdays. Take 1 tablet (5 mg) by mouth on Mondays & Fridays. 09/19/17  Yes [name], MD  ACCU-CHEK AVIVA PLUS test strip TEST THREE TIMES A DAY 04/26/17   Hensel,  Jamal Collin, MD  UNKNOWN TO PATIENT LOVENOX, pt unsure of dose    [provider]     Vital Signs: BP 132/72   Pulse 90   Temp 98 F (36.7 C) (Oral)   Resp 16   Ht 5' 5.5" (1.664 m)   Wt 159 lb 6.3 oz (72.3 kg)   SpO2 96%   BMI 26.12 kg/m   Physical Exam  Constitutional: She is oriented to person, place, and time. She appears well-developed. No distress.  Neck: Normal range of motion. Neck supple.  Temp cath in place on R  Cardiovascular: Normal rate, regular rhythm and normal heart sounds. Exam reveals no gallop and no friction rub.  No murmur heard. Pulmonary/Chest: Effort normal and breath sounds normal. No respiratory distress.  Neurological: She is alert and oriented to person, place, and time.  Skin: Skin is warm and dry. She is not diaphoretic.  Psychiatric: She has a normal mood and affect. Her behavior is normal. Judgment and thought content normal.  Nursing note and vitals reviewed.    Imaging: Ir Fluoro Guide Cv Line Right  Result Date: 11/01/2017 INDICATION: Acute renal failure EXAM: TEMPORARY RIGHT JUGULAR DIALYSIS CATHETER MEDICATIONS: None ANESTHESIA/SEDATION: None FLUOROSCOPY TIME:  Fluoroscopy Time:  minutes 12 seconds (1 mGy). COMPLICATIONS: None immediate. PROCEDURE: Informed written consent was obtained from the patient after a thorough discussion of the procedural risks, benefits and alternatives. All questions were addressed. Maximal Sterile Barrier Technique was utilized including caps, mask, sterile gowns, sterile gloves, sterile drape, hand hygiene and skin antiseptic. A timeout was performed prior to the initiation of the procedure. The right neck was prepped and draped in a sterile fashion. 1% lidocaine was utilized for local anesthesia. Under sonographic guidance, a micropuncture needle was inserted into the right jugular vein and removed over a 018 wire which was up sized to an Amplatz. The jugular vein was noted to be patent. Sonographic  documentation was obtained. Twelve [place] dilator followed by a 12 [place] temporary dialysis catheter was advanced over the wire. It was flushed and sewn in place. FINDINGS: Imaging confirms a temporary right jugular dialysis catheter with its tip at the cavoatrial junction. IMPRESSION: Successful right jugular temporary dialysis catheter with its tip at the cavoatrial junction. Electronically Signed   By: Marybelle Killings M.D.   On: 11/01/2017 14:48   Ir US Guide Vasc Access Right  Result Date: 11/01/2017 INDICATION: Acute renal failure EXAM: TEMPORARY RIGHT JUGULAR DIALYSIS CATHETER MEDICATIONS: None ANESTHESIA/SEDATION: None FLUOROSCOPY TIME:  Fluoroscopy Time:  minutes 12 seconds (1 mGy). COMPLICATIONS: None immediate. PROCEDURE: Informed written consent was obtained from the patient after a thorough discussion of the procedural risks, benefits and alternatives. All questions were addressed. Maximal Sterile Barrier Technique was utilized including caps, mask, sterile gowns, sterile gloves, sterile drape, hand hygiene and skin antiseptic. A timeout was performed prior to the initiation of the procedure. The right neck was  prepped and draped in a sterile fashion. 1% lidocaine was utilized for local anesthesia. Under sonographic guidance, a micropuncture needle was inserted into the right jugular vein and removed over a 018 wire which was up sized to an Amplatz. The jugular vein was noted to be patent. Sonographic documentation was obtained. Twelve [place] dilator followed by a 12 [place] temporary dialysis catheter was advanced over the wire. It was flushed and sewn in place. FINDINGS: Imaging confirms a temporary right jugular dialysis catheter with its tip at the cavoatrial junction. IMPRESSION: Successful right jugular temporary dialysis catheter with its tip at the cavoatrial junction. Electronically Signed   By: Marybelle Killings M.D.   On: 11/01/2017 14:48    Labs:  CBC: Recent Labs    11/01/17 0338  11/02/17 0237 11/03/17 1057 11/04/17 0243  WBC 10.3 8.9 10.7* 10.0  HGB 9.9* 9.9* 9.4* 9.5*  HCT 30.8* 30.3* 30.4* 29.7*  PLT 258 230 230 201    COAGS: Recent Labs    11/28/16 2108 11/29/16 0230 11/30/16 1655 12/01/16 0402  11/01/17 0338 11/02/17 0237 11/03/17 0328 11/04/17 0243  INR 1.33  --   --   --    < > 2.25 2.05 1.70 1.48  APTT 134* 71* 63* 72*  --   --   --   --   --    < > = values in this interval not displayed.    BMP: Recent Labs    11/01/17 0338 11/02/17 0237 11/03/17 0328 11/04/17 0243  NA 134* 139 140 138  K 4.4 3.7 3.8 4.1  CL 99 100 102 102  CO2 22 24 25 26   GLUCOSE 150* 114* 136* 172*  BUN 163* 114* 127* 64*  CALCIUM 8.2* 8.4* 8.1* 8.0*  CREATININE 8.69* 6.74* 7.26* 5.13*  GFRNONAA 4* 5* 5* 8*  GFRAA 5* 6* 6* 9*    LIVER FUNCTION TESTS: Recent Labs    12/20/16 1032 04/11/17 2257 05/04/17 1346 10/27/17 1126  11/01/17 0338 11/01/17 1956 11/02/17 0237 11/03/17 0328 11/04/17 0243  BILITOT 0.2 0.7 0.3 1.1  --   --   --   --   --   --   AST 357* 28 19 33  --   --   --   --   --   --   ALT 238* 18 11 20   --   --  21  --   --   --   ALKPHOS 128* 91 87 67  --   --   --   --   --   --   PROT 4.9* 5.8* 5.4* 5.2*  --   --   --   --   --   --   ALBUMIN 3.3* 3.2* 3.9 2.9*   < > 2.5*  --  2.5* 2.4* 2.4*   < > = values in this interval not displayed.    Assessment and Plan: Renal failure s/p temporary dialysis catheter placement 11/01/17 Coumadin held.  INR 1.48 this AM.  Plan to proceed with tunneled dialysis catheter placement tomorrow as schedule allows.  Discussed plan with patient and husband at bedside.   Risks and benefits discussed with the patient including, but not limited to bleeding, infection, vascular injury, pneumothorax which may require chest tube placement, air embolism or even death  All of the patient's questions were answered, patient is agreeable to proceed. Consent signed and in chart.  Orders in place.    Electronically Signed: Docia Barrier, PA 11/04/2017, 3:00  PM   I spent a total of 15 Minutes at the the patient's bedside AND on the patient's hospital floor or unit, greater than 50% of which was counseling/coordinating care for renal failure.

## 2017-11-04 NOTE — Progress Notes (Signed)
Family Medicine Teaching Service Daily Progress Note Intern Pager: [phone]  Patient name: Christine Santos Medical record number: [id number] Date of birth: May 10, 1945 Age: 72 y.o. Gender: female  Primary Care Provider: [name], MD Consultants: Nephrology Code Status: Full  Pt Overview and Major Events to Date:  10/04: Admitted for abdominal pain found to have Enterobacter bacteremia along with signs of fluid overload  10/09: Temporary cath inserted for HD based on progression to ESRD setting of a HFpEF exacerbation  Assessment and Plan: Christine Santos a 72 y.o.femalewho presented with abdominal pain found to have enterobacter bacteremia. 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  ESRD  Anemia of CKD: New diagnosis since admission following progression of CKD stage V.  Patient appears stable today from a volume standpoint.  Patient currently has LUA fistula not yet matured.  Started ESA based on underlying anemia.  INR at goal, <1.48 for PermCath placement. - Nephrology consulted, appreciate recommendations - Plan for possible PermCath later today, CLIP to [place] [facility] 2nd shift - Daily RFP - ESA with HD  Enterobacter bacteremia: Acute. Enterobacter cloacae likely from urine. Repeat blood cultures are negative. Cefepime started 10/5.  Switched to oral Cipro 10/7. Afebrile since admission. - Continue Cipro 500 mg daily for 10-day duration (10/7-10/15)  Ruptured ovarian cyst: Acute. Resolved. Initially seen on CT and believe to have spontaneously ruptured as was not seen on subsequent imaging. This finding is abnormal in post-menopausal woman so ordered CA-125 yesterday results pending - CA 125 pending   Atrial fibrillation: Chronic. Stable.  Rate controlled. INR 1.7 today.  - Warfarin held since 10/8 for plans for PermCath. - Continue home Lopressor 25 mg twice  daily  Hypertension  Hperlipidemia: Chronic. Stable.  Likely secondary to underlying CKD. - Continue home hydralazine 50 mg 3 times daily, Lopressor 25 mg twice daily - Continue home rosuvastatin 20 mg daily  Type 2 diabetes: Chronic.  Stable.  Not requiring insulin.  A1c 7.0 during admission. - Continue Lantus 16 units daily with sliding scale  Anxiety and depression: Chronic.  Stable. - Continue home Paxil 40 mg daily  Pulmonary nodule: Incidentally found on CT. L lower lobe 33mm nodule, increased from 53mm in 2015 with likely increased size from adjacent atelectasis so thought to be stable and benign. - Follow-up outpatient  GERD: Chronic.  Stable.  Currently on PPI. - Continue pantoprazole 40 mg daily  Gout: Chronic.  Stable.  Currently on allopurinol with known CKD now ESRD. - Continue allopurinol 50 mg daily  FEN/GI: Renal diet PPx: SCDs while warfarin is being held for planned PermCath  Disposition: Plan for insertion of PermCath later today.  Anticipate discharge in the next 24 and 48 hours.  Subjective:  Patient is doing well this morning.  She has no complaints including chest pain, shortness of breath, nausea or vomiting, abdominal pain.  She is hopeful to be discharged soon while awaiting PermCath hopefully this morning.  Objective: Temp:  [97.4 F (36.3 C)-98.5 F (36.9 C)] 98 F (36.7 C) (10/12 0759) Pulse Rate:  [76-110] 90 (10/12 0759) Resp:  [16-20] 16 (10/12 0759) BP: (132-158)/(58-90) 132/72 (10/12 0759) SpO2:  [96 %-100 %] 96 % (10/12 0759) Weight:  [71.6 kg-72.7 kg] 72.3 kg (10/12 0447)   Physical Exam: General: sitting on edge of bed comfortable talking with family, well nourished, well developed, NAD with non-toxic  appearance HEENT: normocephalic, atraumatic, moist mucous membranes Neck: supple, non-tender without lymphadenopathy Cardiovascular: Irregularly irregular with 2/6 systolic murmur, LUE AVF, RIJ HD cath intact Lungs: clear to  auscultation bilaterally with normal work of breathing on room air Abdomen: soft, non-tender, non-distended, normoactive bowel sounds Skin: warm, dry, no rashes or lesions, cap refill < 2 seconds Extremities: warm and well perfused, normal tone, trace edema on lower extremities bilaterally with 1+ pitting edema on left arm distal to fistula site   Laboratory: Recent Labs  Lab 11/02/17 0237 11/03/17 1057 11/04/17 0243  WBC 8.9 10.7* 10.0  HGB 9.9* 9.4* 9.5*  HCT 30.3* 30.4* 29.7*  PLT 230 230 201   Recent Labs  Lab 11/01/17 1956 11/02/17 0237 11/03/17 0328 11/04/17 0243  NA  --  139 140 138  K  --  3.7 3.8 4.1  CL  --  100 102 102  CO2  --  24 25 26   BUN  --  114* 127* 64*  CREATININE  --  6.74* 7.26* 5.13*  CALCIUM  --  8.4* 8.1* 8.0*  ALT 21  --   --   --   GLUCOSE  --  114* 136* 172*   CA-125: Pending Transferrin: 188 (L) Ferritin: 278 (WNL) ALT: 21 HBV panel: Negative HBsAg, nagative HBsAb. HBcAb Urine culture: Positive Enterobacter cloacae sensitive to Cipro Blood culture: Positive Enterobacter cloacae  Imaging/Diagnostic Tests: TEMPORARY RIGHT JUGULAR DIALYSIS CATHETER (11/01/2017) IMPRESSION: Successful right jugular temporary dialysis catheter with its tip at the cavoatrial junction.  CT CHEST WITHOUT CONTRAST (10/27/2017) IMPRESSION: 1. Large layering right pleural effusion with compressive atelectasis. 2. Small left pleural effusion. Minimal septal thickening and ground-glass opacity in the left lower lobe is likely pulmonary edema. 3. Left lower lobe pulmonary nodule measures 7 mm. This nodule measured 5 mm on PET CT 01/10/2014. Lack of significant or interval growth favors a benign etiology, likely post infective or inflammatory. Apparent 2 mm increase in size is likely due to adjacent atelectasis. 4. Multi chamber cardiomegaly. Coronary artery calcifications, post CABG. 5. Aortic Atherosclerosis (ICD10-I70.0).  TRANSABDOMINAL ULTRASOUND OF PELVIS  (10/27/2017) IMPRESSION: 1. The known right ovarian cyst seen on previous CT imaging is not appreciated on this ultrasound. 2. Free fluid in the pelvis is nonspecific. 3. No other abnormalities noted.  CT ABDOMEN AND PELVIS WITHOUT CONTRAST (10/27/2017) IMPRESSION: Large right pleural effusion with dependent pulmonary atelectasis. Cardiomegaly. Free intraperitoneal fluid. This is most prominent in the pelvis. There is a right ovarian cyst as was seen on the previous study, surrounded by the fluid. I cannot rule out the possibility of inflammatory disease in the pelvis. I cannot identify any acute bowel pathology. Widespread and extensive vascular calcification. Fullness of the right renal collecting system and proximal ureter, possibly secondary to the right-sided pelvic process seen above. Ventral hernia containing only fat. Because of the indeterminate findings, one might consider a pelvic ultrasound and/or abdominal CT with oral and intravenous contrast if Possible.  CHEST - 2 VIEW (10/27/2017) 1. Prior CABG. Cardiomegaly with diffuse bilateral from interstitial prominence and bilateral pleural effusions consistent CHF. 2.  Gastric distention.    Harriet Butte, DO 11/04/2017, 9:54 AM PGY-3, [place] Intern pager: [phone], text pages welcome

## 2017-11-05 ENCOUNTER — Encounter (HOSPITAL_COMMUNITY): Payer: Self-pay | Admitting: Diagnostic Radiology

## 2017-11-05 ENCOUNTER — Inpatient Hospital Stay (HOSPITAL_COMMUNITY): Payer: Medicare Other

## 2017-11-05 DIAGNOSIS — R109 Unspecified abdominal pain: Secondary | ICD-10-CM

## 2017-11-05 DIAGNOSIS — Z992 Dependence on renal dialysis: Secondary | ICD-10-CM

## 2017-11-05 HISTORY — PX: IR FLUORO GUIDE CV LINE RIGHT: IMG2283

## 2017-11-05 HISTORY — PX: IR US GUIDE VASC ACCESS RIGHT: IMG2390

## 2017-11-05 LAB — RENAL FUNCTION PANEL
ALBUMIN: 2.4 g/dL — AB (ref 3.5–5.0)
Anion gap: 11 (ref 5–15)
BUN: 73 mg/dL — AB (ref 8–23)
CALCIUM: 8.1 mg/dL — AB (ref 8.9–10.3)
CO2: 24 mmol/L (ref 22–32)
CREATININE: 6.09 mg/dL — AB (ref 0.44–1.00)
Chloride: 103 mmol/L (ref 98–111)
GFR calc Af Amer: 7 mL/min — ABNORMAL LOW (ref >60)
GFR, EST NON AFRICAN AMERICAN: 6 mL/min — AB (ref >60)
Glucose, Bld: 126 mg/dL — ABNORMAL HIGH (ref 70–99)
PHOSPHORUS: 4.1 mg/dL (ref 2.5–4.6)
Potassium: 3.9 mmol/L (ref 3.5–5.1)
SODIUM: 138 mmol/L (ref 135–145)

## 2017-11-05 LAB — CBC
HEMATOCRIT: 29.9 % — AB (ref 36.0–46.0)
Hemoglobin: 9.5 g/dL — ABNORMAL LOW (ref 12.0–15.0)
MCH: 30.8 pg (ref 26.0–34.0)
MCHC: 31.8 g/dL (ref 30.0–36.0)
MCV: 97.1 fL (ref 80.0–100.0)
PLATELETS: 218 10*3/uL (ref 150–400)
RBC: 3.08 MIL/uL — ABNORMAL LOW (ref 3.87–5.11)
RDW: 18.1 % — AB (ref 11.5–15.5)
WBC: 10.7 10*3/uL — AB (ref 4.0–10.5)
nRBC: 0 % (ref 0.0–0.2)

## 2017-11-05 LAB — PROTIME-INR
INR: 1.3
PROTHROMBIN TIME: 16.1 s — AB (ref 11.4–15.2)

## 2017-11-05 LAB — GLUCOSE, CAPILLARY
GLUCOSE-CAPILLARY: 116 mg/dL — AB (ref 70–99)
GLUCOSE-CAPILLARY: 101 mg/dL — AB (ref 70–99)

## 2017-11-05 MED ORDER — MIDAZOLAM HCL 2 MG/2ML IJ SOLN
INTRAMUSCULAR | Status: AC | PRN
  Administered 2017-11-05: 1 mg via INTRAVENOUS
  Administered 2017-11-05: 0.5 mg via INTRAVENOUS

## 2017-11-05 MED ORDER — LIDOCAINE HCL (PF) 1 % IJ SOLN
INTRAMUSCULAR | Status: AC | PRN
  Administered 2017-11-05: 20 mL

## 2017-11-05 MED ORDER — FENTANYL CITRATE (PF) 100 MCG/2ML IJ SOLN
INTRAMUSCULAR | Status: AC | PRN
  Administered 2017-11-05: 25 ug via INTRAVENOUS
  Administered 2017-11-05: 50 ug via INTRAVENOUS

## 2017-11-05 MED ORDER — VANCOMYCIN HCL IN DEXTROSE 1-5 GM/200ML-% IV SOLN
INTRAVENOUS | Status: AC
  Administered 2017-11-05: 1000 mg via INTRAVENOUS
  Filled 2017-11-05: qty 200

## 2017-11-05 MED ORDER — FENTANYL CITRATE (PF) 100 MCG/2ML IJ SOLN
INTRAMUSCULAR | Status: AC
  Filled 2017-11-05: qty 2

## 2017-11-05 MED ORDER — HEPARIN SODIUM (PORCINE) 1000 UNIT/ML IJ SOLN
INTRAMUSCULAR | Status: AC
  Administered 2017-11-05: 3.8 mL
  Filled 2017-11-05: qty 1

## 2017-11-05 MED ORDER — MIDAZOLAM HCL 2 MG/2ML IJ SOLN
INTRAMUSCULAR | Status: AC
  Filled 2017-11-05: qty 2

## 2017-11-05 MED ORDER — CIPROFLOXACIN HCL 500 MG PO TABS: 500.0000 mg | ORAL_TABLET | Freq: Every day | ORAL | 0 refills | Status: DC

## 2017-11-05 MED ORDER — ROSUVASTATIN CALCIUM 10 MG PO TABS: 10.0000 mg | ORAL_TABLET | Freq: Every day | ORAL | 0 refills | Status: DC

## 2017-11-05 MED ORDER — LIDOCAINE HCL 1 % IJ SOLN
INTRAMUSCULAR | Status: AC
  Filled 2017-11-05: qty 20

## 2017-11-05 MED ORDER — CHLORHEXIDINE GLUCONATE 4 % EX LIQD
CUTANEOUS | Status: AC
  Filled 2017-11-05: qty 15

## 2017-11-05 MED ORDER — METOPROLOL TARTRATE 25 MG PO TABS: 25.0000 mg | ORAL_TABLET | Freq: Two times a day (BID) | ORAL | 0 refills | Status: DC

## 2017-11-05 MED ORDER — TRAMADOL HCL 50 MG PO TABS: 50.0000 mg | ORAL_TABLET | Freq: Two times a day (BID) | ORAL | Status: DC | PRN

## 2017-11-05 MED ORDER — ALLOPURINOL 100 MG PO TABS: 50.0000 mg | ORAL_TABLET | Freq: Every day | ORAL | 0 refills | Status: DC

## 2017-11-05 NOTE — Procedures (Signed)
  Pre-operative Diagnosis: ESRD and needs hemodialysis while left arm fistula matures       Post-operative Diagnosis:  ESRD and needs hemodialysis while left arm fistula matures   Indications: Needs tunneled dialysis catheter  Procedure: Removal of existing non-tunneled right jugular catheter;  Placement of right jugular tunneled dialysis catheter  Findings: Patent Right IJ.  Tip at SVC/RA junction  Complications: None  EBL: Minimal  Plan: Catheter is ready to use.

## 2017-11-05 NOTE — Progress Notes (Signed)
Family Medicine Teaching Service Daily Progress Note Intern Pager: [phone]  Patient name: Christine Santos Medical record number: [id number] Date of birth: [birth year] Age: 72 y.o. Gender: female  Primary Care Provider: [name], MD Consultants: Nephrology Code Status: Full  Pt Overview and Major Events to Date:  10/04: Admitted for abdominal pain found to have Enterobacter bacteremia along with signs of fluid overload  10/09: Temporary cath inserted for HD based on progression to ESRD setting of a HFpEF exacerbation  Assessment and Plan: Christine Santos a 72 y.o.femalewho presented with abdominal pain found to have enterobacter bacteremia. 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  ESRD: Patient appears stable today from a volume standpoint. LUE fistula placed 9/30 not yet matured. Patient met INR criteria for permcath placement and is going this morning for procedure. She was very excited to get it and go home. Patient is set up for outpatient dialysis after discharge. - f/u nephro recs - monitor vitals and fluid status - f/u with patient after permcath placement - possible discharge later today pending procedure going well  Enterobacter bacteremia: Acute. Enterobacter cloacae likely from urine. Repeat blood cultures are negative. Cefepime started 10/5.  Switched to oral Cipro 10/7. Afebrile since admission. WBC slightly elevated today to 10.7 and has been around this level for a few days. Unlikely from infectious source with afebrile and on antibiotic and no obvious source. Can be secondary to starting dialysis/ body stress.  - Continue Cipro 500 mg daily for 10-day duration (10/7-10/15)  Ruptured ovarian cyst: Acute. Resolved. Initially seen on CT and believe to have spontaneously ruptured as was not seen on subsequent imaging. This finding is abnormal in post-menopausal  woman so ordered CA-125 which is elevated to 243.0-- not sure if the patient has been informed of this finding yet so will do some investigating and then discuss with attending and patient for follow up.  - CA 125 elevated- will need follow up outpatient - will inform patient if not already done   Atrial fibrillation: Chronic. Stable.  Rate controlled. INR 1.3 today.  - Warfarin held since 10/8 for plans for PermCath. - Continue home Lopressor 25 mg twice daily  Hypertension  Hperlipidemia: Chronic. Stable. - Continue home hydralazine 50 mg 3 times daily, Lopressor 25 mg twice daily - Continue home rosuvastatin 20 mg daily  Type 2 diabetes: Chronic.  Stable.  Not requiring insulin.  A1c 7.0 during admission. - Continue Lantus 16 units daily with sliding scale  Anxiety and depression: Chronic.  Stable. - Continue home Paxil 40 mg daily  Pulmonary nodule: Incidentally found on CT. L lower lobe 54m nodule, increased from 569min 2015 with likely increased size from adjacent atelectasis so thought to be stable and benign. - Follow-up outpatient  GERD: Chronic.  Stable.  Currently on PPI. - Continue pantoprazole 40 mg daily  Gout: Chronic.  Stable.  Currently on allopurinol with known CKD now ESRD. - Continue allopurinol 50 mg daily  FEN/GI: Renal diet PPx: SCDs while warfarin is being held for planned PermCath  Disposition: PermCath placement today. Discharge possible later today pending the procedure.  Subjective:  Overnight: no acute events. One recorded episode of oxygen desat to 88 but patient was not made aware and denies any difficulties breathing. Remains stable and well oxygenated since.  Today: patient and family members are in great spirits!! She is  on her way to get perm cath and they want to know if she can go home today.  Objective: Temp:  [97.2 F (36.2 C)-98.2 F (36.8 C)] 98.2 F (36.8 C) (10/13 0755) Pulse Rate:  [86-93] 86 (10/13 0755) Resp:  [15-18] 18  (10/13 0755) BP: (133-135)/(56-72) 134/72 (10/13 0755) SpO2:  [88 %-100 %] 94 % (10/13 0755) Weight:  [72.9 kg] 72.9 kg (10/13 0428)   Physical Exam: General: comfortable, lying down and getting ready for transport HEENT: normocephalic, atraumatic, moist mucous membranes Cardiovascular: Irregularly irregular with 2/6 systolic murmur, LUE AVF Lungs: clear to auscultation bilaterally with normal work of breathing on room air Abdomen: soft, non-tender, non-distended, normoactive bowel sounds Skin: warm, dry, no rashes or lesions, cap refill < 2 seconds Extremities: warm and well perfused, normal tone, trace edema on lower extremities bilaterally with minimal non-pitting edema on left hand   Laboratory: Recent Labs  Lab 11/03/17 1057 11/04/17 0243 11/05/17 0319  WBC 10.7* 10.0 10.7*  HGB 9.4* 9.5* 9.5*  HCT 30.4* 29.7* 29.9*  PLT 230 201 218   Recent Labs  Lab 11/01/17 1956  11/03/17 0328 11/04/17 0243 11/05/17 0319  NA  --    < > 140 138 138  K  --    < > 3.8 4.1 3.9  CL  --    < > 102 102 103  CO2  --    < > _0 BUN  --    < > 127* 64* 73*  CREATININE  --    < > 7.26* 5.13* 6.09*  CALCIUM  --    < > 8.1* 8.0* 8.1*  ALT 21  --   --   --   --   GLUCOSE  --    < > 136* 172* 126*   < > = values in this interval not displayed.   CA-125: 243 Transferrin: 188 (L) Ferritin: 278 (WNL) ALT: 21 HBV panel: Negative HBsAg, nagative HBsAb. HBcAb Urine culture: Positive Enterobacter cloacae sensitive to Cipro Blood culture: Positive Enterobacter cloacae  Imaging/Diagnostic Tests: TEMPORARY RIGHT JUGULAR DIALYSIS CATHETER (11/01/2017) IMPRESSION: Successful right jugular temporary dialysis catheter with its tip at the cavoatrial junction.  CT CHEST WITHOUT CONTRAST (10/27/2017) IMPRESSION: 1. Large layering right pleural effusion with compressive atelectasis. 2. Small left pleural effusion. Minimal septal thickening and ground-glass opacity in the left lower lobe is  likely pulmonary edema. 3. Left lower lobe pulmonary nodule measures 7 mm. This nodule measured 5 mm on PET CT 01/10/2014. Lack of significant or interval growth favors a benign etiology, likely post infective or inflammatory. Apparent 2 mm increase in size is likely due to adjacent atelectasis. 4. Multi chamber cardiomegaly. Coronary artery calcifications, post CABG. 5. Aortic Atherosclerosis (ICD10-I70.0).  TRANSABDOMINAL ULTRASOUND OF PELVIS (10/27/2017) IMPRESSION: 1. The known right ovarian cyst seen on previous CT imaging is not appreciated on this ultrasound. 2. Free fluid in the pelvis is nonspecific. 3. No other abnormalities noted.  CT ABDOMEN AND PELVIS WITHOUT CONTRAST (10/27/2017) IMPRESSION: Large right pleural effusion with dependent pulmonary atelectasis. Cardiomegaly. Free intraperitoneal fluid. This is most prominent in the pelvis. There is a right ovarian cyst as was seen on the previous study, surrounded by the fluid. I cannot rule out the possibility of inflammatory disease in the pelvis. I cannot identify any acute bowel pathology. Widespread and extensive vascular calcification. Fullness of the right renal collecting system and proximal ureter, possibly secondary to the right-sided pelvic process seen above. Ventral hernia containing only  fat. Because of the indeterminate findings, one might consider a pelvic ultrasound and/or abdominal CT with oral and intravenous contrast if Possible.  CHEST - 2 VIEW (10/27/2017) 1. Prior CABG. Cardiomegaly with diffuse bilateral from interstitial prominence and bilateral pleural effusions consistent CHF. 2.  Gastric distention.    Doristine Mango, DO 11/05/2017, 9:07 AM PGY 1, [place] Intern pager: [phone], text pages welcome

## 2017-11-05 NOTE — Progress Notes (Signed)
Requested HH order from MD. Notified [facility] of DC today.

## 2017-11-05 NOTE — Discharge Instructions (Signed)
During your stay, you were treated for renal failure with dialysis. We also made changes to your medications Allopurinol, tramadol, and crestor were reduced to half your home dose.  After discharge, you should continue to take those half doses.  You will have 2 more days of Cipro antibiotic treatment. You can continue taking your warfarin doses now.   Please follow up with Dr. Andria Frames in order to talk more about the ovarian cyst found when you first came to the hospital. Please also follow up with your kidney doctor for dialysis as scheduled and take your antibiotic after dialysis.  It was a pleasure to meet you and take part in your care. Doristine Mango, DO  Information on my medicine - Coumadin   (Warfarin)-  You were already taking this medication prior to this hospitalization.   This medication education was reviewed with me or my healthcare representative as part of my discharge preparation.   Why was Coumadin prescribed for you? Coumadin was prescribed for you because you have a blood clot or a medical condition that can cause an increased risk of forming blood clots. Blood clots can cause serious health problems by blocking the flow of blood to the heart, lung, or brain. Coumadin can prevent harmful blood clots from forming. As a reminder your indication for Coumadin is:   Stroke Prevention Because Of Atrial Fibrillation  What test will check on my response to Coumadin? While on Coumadin (warfarin) you will need to have an INR test regularly to ensure that your dose is keeping you in the desired range. The INR (international normalized ratio) number is calculated from the result of the laboratory test called prothrombin time (PT).  If an INR APPOINTMENT HAS NOT ALREADY BEEN MADE FOR YOU please schedule an appointment to have this lab work done by your health care provider within 7 days. Your INR goal is usually a number between:  2 to 3 or your provider may give you a more narrow  range like 2-2.5.  Ask your health care provider during an office visit what your goal INR is.  What  do you need to  know  About  COUMADIN? Take Coumadin (warfarin) exactly as prescribed by your healthcare provider about the same time each day.  DO NOT stop taking without talking to the doctor who prescribed the medication.  Stopping without other blood clot prevention medication to take the place of Coumadin may increase your risk of developing a new clot or stroke.  Get refills before you run out.  What do you do if you miss a dose? If you miss a dose, take it as soon as you remember on the same day then continue your regularly scheduled regimen the next day.  Do not take two doses of Coumadin at the same time.  Important Safety Information A possible side effect of Coumadin (Warfarin) is an increased risk of bleeding. You should call your healthcare provider right away if you experience any of the following: ? Bleeding from an injury or your nose that does not stop. ? Unusual colored urine (red or dark brown) or unusual colored stools (red or black). ? Unusual bruising for unknown reasons. ? A serious fall or if you hit your head (even if there is no bleeding).  Some foods or medicines interact with Coumadin (warfarin) and might alter your response to warfarin. To help avoid this: ? Eat a balanced diet, maintaining a consistent amount of Vitamin K. ? Notify your provider about major  diet changes you plan to make. ? Avoid alcohol or limit your intake to 1 drink for women and 2 drinks for men per day. (1 drink is 5 oz. wine, 12 oz. beer, or 1.5 oz. liquor.)  Make sure that ANY health care provider who prescribes medication for you knows that you are taking Coumadin (warfarin).  Also make sure the healthcare provider who is monitoring your Coumadin knows when you have started a new medication including herbals and non-prescription products.  Coumadin (Warfarin)  Major Drug Interactions    Increased Warfarin Effect Decreased Warfarin Effect  Alcohol (large quantities) Antibiotics (esp. Septra/Bactrim, Flagyl, Cipro) Amiodarone (Cordarone) Aspirin (ASA) Cimetidine (Tagamet) Megestrol (Megace) NSAIDs (ibuprofen, naproxen, etc.) Piroxicam (Feldene) Propafenone (Rythmol SR) Propranolol (Inderal) Isoniazid (INH) Posaconazole (Noxafil) Barbiturates (Phenobarbital) Carbamazepine (Tegretol) Chlordiazepoxide (Librium) Cholestyramine (Questran) Griseofulvin Oral Contraceptives Rifampin Sucralfate (Carafate) Vitamin K   Coumadin (Warfarin) Major Herbal Interactions  Increased Warfarin Effect Decreased Warfarin Effect  Garlic Ginseng Ginkgo biloba Coenzyme Q10 Green tea St. Johns wort    Coumadin (Warfarin) FOOD Interactions  Eat a consistent number of servings per week of foods HIGH in Vitamin K (1 serving =  cup)  Collards (cooked, or boiled & drained) Kale (cooked, or boiled & drained) Mustard greens (cooked, or boiled & drained) Parsley *serving size only =  cup Spinach (cooked, or boiled & drained) Swiss chard (cooked, or boiled & drained) Turnip greens (cooked, or boiled & drained)  Eat a consistent number of servings per week of foods MEDIUM-HIGH in Vitamin K (1 serving = 1 cup)  Asparagus (cooked, or boiled & drained) Broccoli (cooked, boiled & drained, or raw & chopped) Brussel sprouts (cooked, or boiled & drained) *serving size only =  cup Lettuce, raw (green leaf, endive, romaine) Spinach, raw Turnip greens, raw & chopped   These websites have more information on Coumadin (warfarin):  FailFactory.se; VeganReport.com.au;

## 2017-11-05 NOTE — Discharge Summary (Signed)
[facility] Discharge Summary  Patient name: Christine Santos Medical record number: [id number] Date of birth: January 11, 1946 Age: 72 y.o. 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 Lungs: clear to auscultation bilaterally with normal work of breathing on room air Abdomen: soft, non-tender, non-distended, normoactive bowel sounds Skin: warm, dry, no rashes or lesions, cap refill < 2 seconds Extremities: warm and well perfused, normal tone, trace edema on lower extremities bilaterally with minimal non-pitting edema on left hand much improved since days prior. Left arm graft no signs of infection  Brief Hospital Course:  Patient admitted for abdominal pain 2/2 ruptured ovarian cyst. While inpatient, patient developed respiratory distress 2/2 fluid overload and pleural effusions which was exacerbated by worsening kidney function. Patient also had enterobacter bacteremia likely secondary to urine source. For the ovarian cyst- she was treated with pain control and CA-125 was elevated. Patient received antibiotic treatment for bacteremia  and remained afebrile with 2 days left of oral treatment after discharge.  For the CKD V requiring dialysis, patient initially received dialysis through temporary cath two times, portacath placed on day of discharge with plan to continue dialysis outpatient on Tuesday. Respiratory status improved after dialysis was initiated. At time of discharge, patient was breathing comfortably ORA, had normal WBC count, vital signs stable, no abdominal pain, appeared euvolemic, and no dysuria.  Patient left with instructions to follow up with nephrology and PCP.  Issues for Follow Up:  1. Will need follow up with nephrology 1. Dialysis initiation outpatient 2. Fluid status 3. permcath healing 4. Left arm graft maturation maturation 2. Will need follow up with PCP  1. Monitor completion of Abx treatment on 10/15 and resolution of infectious symptoms. 2. Elevated CA-125 level: 243 inpatient (patient is aware) which was checked 2/2 ovarian cyst in post-menopausal woman 3. Adherence with new medication changes 2/2 dialysis initiation 1. Decreased allopurinol to 50mg  2. Decreased tramadol to q12hours 3. Decreased Crestor to 10mg  4. Discontinued torsemide 5. Monitor warfarin/INR 4. Pulmonary nodule seen on CT 49mm increased from 52mm in 2015 5. Respiratory status monitoring 2/2 pleural effusion and oxygen requirement while inpatient  Significant Procedures:  temp cath placement(10/9) and removal (10/13) Dialysis began (10/9) permcath placed 10/13  Significant Labs and Imaging:  Recent Labs  Lab 11/03/17 1057 11/04/17 0243 11/05/17 0319  WBC 10.7* 10.0 10.7*  HGB 9.4* 9.5* 9.5*  HCT 30.4* 29.7* 29.9*  PLT 230 201 218   Recent Labs  Lab 11/01/17 0338 11/01/17 1956 11/02/17 0237 11/03/17 0328 11/03/17 1121 11/04/17 0243 11/05/17 0319  NA 134*  --  139 140  --  138 138  K 4.4  --  3.7 3.8  --  4.1 3.9  CL 99  --  100 102  --  102 103  CO2 22  --  24 25  --  26 24  GLUCOSE 150*  --  114*  136*  --  172* 126*  BUN 163*  --  114* 127*  --  64* 73*  CREATININE 8.69*  --  6.74* 7.26*  --  5.13* 6.09*  CALCIUM 8.2*  --  8.4* 8.1*  --  8.0* 8.1*  PHOS 5.8*  --  5.1* 5.4* 4.5 3.3 4.1  ALT  --  21  --   --   --   --   --   ALBUMIN 2.5*  --  2.5* 2.4*  --  2.4* 2.4*    Results/Tests Pending at Time of Discharge: none  Discharge Medications:  Allergies as of 11/05/2017      Reactions   Cefepime Anaphylaxis   Throat swelling   Other Other (See Comments)   Darvocet-rash   Amoxicillin Rash   Has patient had a PCN reaction causing immediate rash, facial/tongue/throat swelling, SOB or lightheadedness with hypotension: No Has patient had a PCN reaction causing severe rash involving mucus membranes or skin necrosis: No Has patient had a PCN reaction that required hospitalization: No Has patient had a PCN reaction occurring within the last 10 years: No If all of the above answers are "NO", then may proceed with Cephalosporin use.   Codeine Phosphate Rash   Penicillins Rash   Has patient had a PCN reaction causing immediate rash, facial/tongue/throat swelling, SOB or lightheadedness with hypotension: No Has patient had a PCN reaction causing severe rash involving mucus membranes or skin necrosis: No Has patient had a PCN reaction that required hospitalization: No Has patient had a PCN reaction occurring within the last 10 years: No If all of the above answers are "NO", then may proceed with Cephalosporin use.      Medication List    STOP taking these medications   torsemide 100 MG tablet Commonly known as:  DEMADEX   UNKNOWN TO PATIENT     TAKE these medications   ACCU-CHEK AVIVA PLUS test strip Generic drug:  glucose blood TEST THREE TIMES A DAY   allopurinol 100 MG tablet Commonly known as:  ZYLOPRIM Take 0.5 tablets (50 mg total) by mouth daily. Start taking on:  11/06/2017 What changed:  See the new instructions.   carboxymethylcellulose 0.5 % Soln Commonly  known as:  REFRESH PLUS Place 1 drop into both eyes 3 (three) times daily as needed (dry eyes).   ciprofloxacin 500 MG tablet Commonly known as:  CIPRO Take 1 tablet (500 mg total) by mouth daily. Take medication after dialysis   ferrous sulfate 325 (65 FE) MG tablet Take 1 tablet (325 mg total) by mouth daily with breakfast.   GNP GARLIC EXTRACT PO Take 1 tablet by mouth daily.   HUMALOG KWIKPEN 100 UNIT/ML KiwkPen Generic drug:  insulin lispro Inject 5 Units as directed 3 (three) times daily after meals.   hydrALAZINE 50 MG tablet Commonly known as:  APRESOLINE TAKE 1 TABLET BY MOUTH THREE TIMES DAILY   LANTUS SOLOSTAR 100 UNIT/ML Solostar Pen Generic drug:  Insulin Glargine ADMINISTER 35 UNITS UNDER THE SKIN DAILY What changed:  See the new instructions.   Melatonin 10 MG Caps Take 10 mg by mouth at bedtime as needed (sleep).   metoprolol tartrate 25 MG tablet Commonly known as:  LOPRESSOR Take 1 tablet (25 mg total) by mouth 2 (two) times daily.   EYE VITAMINS PO Take 1 capsule by mouth daily. Taking one daily from her opthamologist (Dr. Herbert Deaner)   multivitamins ther. w/minerals  Tabs tablet Take 1 tablet by mouth daily.   omega-3 acid ethyl esters 1 g capsule Commonly known as:  LOVAZA Take 2 g by mouth 2 (two) times daily.   ondansetron 4 MG tablet Commonly known as:  ZOFRAN Take 1 tablet (4 mg total) by mouth every 8 (eight) hours as needed for nausea or vomiting.   pantoprazole 40 MG tablet Commonly known as:  PROTONIX TAKE 1 TABLET BY MOUTH DAILY What changed:  when to take this   PARoxetine 40 MG tablet Commonly known as:  PAXIL Take 1 tablet (40 mg total) by mouth every morning.   rosuvastatin 10 MG tablet Commonly known as:  CRESTOR Take 1 tablet (10 mg total) by mouth at bedtime. What changed:    medication strength  how much to take  when to take this   traMADol 50 MG tablet Commonly known as:  ULTRAM Take 1 tablet (50 mg total) by  mouth every 12 (twelve) hours as needed (for pain.). What changed:  when to take this   vitamin C 100 MG tablet Take 100 mg by mouth daily.   warfarin 5 MG tablet Commonly known as:  COUMADIN Take as directed. If you are unsure how to take this medication, talk to your nurse or doctor. Original instructions:  Take 1 tablet (5 mg total) by mouth daily. What changed:    when to take this  additional instructions       Discharge Instructions: Please refer to Patient Instructions section of EMR for full details.  Patient was counseled important signs and symptoms that should prompt return to medical care, changes in medications, dietary instructions, activity restrictions, and follow up appointments.   Follow-Up Appointments: Follow-up Information    [facility]-Home Follow up.   Specialty:  Home Health Services Why:  HHPT, HHOT Contact information: [address] [place] [address] [place], [place], DO 11/05/2017, 5:40 PM PGY-1, [place]

## 2017-11-05 NOTE — Progress Notes (Signed)
[facility] Progress Note   Subjective:      CLIP to [place] [facility] 2nd Shift  No interval events  INR 1.3, likely for tunneled dialysis catheter  Objective Vitals:   11/05/17 0944 11/05/17 0949 11/05/17 0955 11/05/17 1010  BP: (!) 153/61 136/68 119/69 121/66  Pulse: 88 94 90 88  Resp: 15 14 11 14   Temp:      TempSrc:      SpO2: 98% 98% 100% 96%  Weight:      Height:       Physical Exam General: sitting up, comfortable, nontoxic Heart: irreg irreg, syst murmur, no rub Lungs: rales on left, dec BS right, normal WOB Abdomen: soft nontender Extremities: diffuse edema Dialysis Access: LUE AVF post op ecchymosis and dressing R IJ TEMP HD Cath present  Additional Objective Labs: Basic Metabolic Panel: Recent Labs  Lab 11/03/17 0328 11/03/17 1121 11/04/17 0243 11/05/17 0319  NA 140  --  138 138  K 3.8  --  4.1 3.9  CL 102  --  102 103  CO2 25  --  26 24  GLUCOSE 136*  --  172* 126*  BUN 127*  --  64* 73*  CREATININE 7.26*  --  5.13* 6.09*  CALCIUM 8.1*  --  8.0* 8.1*  PHOS 5.4* 4.5 3.3 4.1   Liver Function Tests: Recent Labs  Lab 11/01/17 1956  11/03/17 0328 11/04/17 0243 11/05/17 0319  ALT 21  --   --   --   --   ALBUMIN  --    < > 2.4* 2.4* 2.4*   < > = values in this interval not displayed.   No results for input(s): LIPASE, AMYLASE in the last 168 hours. CBC: Recent Labs  Lab 10/30/17 0302  11/01/17 0338 11/02/17 0237 11/03/17 1057 11/04/17 0243 11/05/17 0319  WBC 11.6*   < > 10.3 8.9 10.7* 10.0 10.7*  NEUTROABS 8.2*  --   --  6.6  --  6.6  --   HGB 9.6*   < > 9.9* 9.9* 9.4* 9.5* 9.5*  HCT 29.7*   < > 30.8* 30.3* 30.4* 29.7* 29.9*  MCV 95.8   < > 94.5 93.8 96.8 96.4 97.1  PLT 218   < > 258 230 230 201 218   < > = values in this interval not displayed.   Blood Culture    Component Value Date/Time   SDES BLOOD RIGHT ANTECUBITAL 10/29/2017 1125   SDES BLOOD RIGHT ANTECUBITAL 10/29/2017 1125   SPECREQUEST  10/29/2017 1125     BOTTLES DRAWN AEROBIC AND ANAEROBIC Blood Culture adequate volume   SPECREQUEST  10/29/2017 1125    BOTTLES DRAWN AEROBIC AND ANAEROBIC Blood Culture adequate volume   CULT  10/29/2017 1125    NO GROWTH 5 DAYS Performed at [facility], [place] [address] [place], [place] [address]    CULT  10/29/2017 1125    NO GROWTH 5 DAYS Performed at [facility], [place] [address], [place], [place] [address]    REPTSTATUS 11/03/2017 FINAL 10/29/2017 1125   REPTSTATUS 11/03/2017 FINAL 10/29/2017 1125    Cardiac Enzymes: No results for input(s): CKTOTAL, CKMB, CKMBINDEX, TROPONINI in the last 168 hours. CBG: Recent Labs  Lab 11/04/17 0759 11/04/17 1215 11/04/17 1702 11/04/17 2205 11/05/17 0754  GLUCAP 123* 129* 231* 165* 116*   Iron Studies:  Recent Labs    11/03/17 1121 11/03/17 1510  IRON  --  29  TIBC  --  272  TRANSFERRIN 188*  --   FERRITIN 278  --    @[email or web address] @ Studies/Results: Ir Fluoro Guide Cv Line Right  Result Date: 11/05/2017 INDICATION: 72 year old with end-stage renal disease. Patient needs a tunneled dialysis catheter while the left arm fistula matures. Patient currently has non tunneled dialysis catheter. EXAM: FLUOROSCOPIC AND ULTRASOUND GUIDED PLACEMENT OF A TUNNELED DIALYSIS CATHETER Physician: Stephan Minister. Anselm Pancoast, MD MEDICATIONS: Vancomycin 1 gm IV; The antibiotic was administered within an appropriate time interval prior to skin puncture. ANESTHESIA/SEDATION: Versed 2 mg IV; Fentanyl 100 mcg IV; Moderate Sedation Time:  30 minutes The patient was continuously monitored during the procedure by the interventional radiology nurse under my direct supervision. FLUOROSCOPY TIME:  Fluoroscopy Time: 18 seconds, 3 mGy COMPLICATIONS: None immediate. PROCEDURE: The procedure was explained to the patient. The risks and benefits of the procedure were discussed and the patient's questions were addressed. Informed consent was obtained from the patient. The existing right neck  catheter was removed with manual compression. The patient was placed supine on the interventional table. Ultrasound confirmed a patent right internal jugular vein. Ultrasound images were obtained for documentation. The right side of the neck and chest was prepped and draped in a sterile fashion. The right neck was anesthetized with 1% lidocaine. Maximal barrier sterile technique was utilized including caps, mask, sterile gowns, sterile gloves, sterile drape, hand hygiene and skin antiseptic. A small incision was made with #11 blade scalpel. A 21 gauge needle directed into the right internal jugular vein with ultrasound guidance. A micropuncture dilator set was placed. A 23 cm tip to cuff Palindrome catheter was selected. The skin below the right clavicle was anesthetized and a small incision was made with an #11 blade scalpel. A subcutaneous tunnel was formed to the vein dermatotomy site. The catheter was brought through the tunnel. The vein dermatotomy site was dilated to accommodate a peel-away sheath. The catheter was placed through the peel-away sheath and directed into the central venous structures. The tip of the catheter was placed at the superior cavoatrial junction with fluoroscopy. Fluoroscopic images were obtained for documentation. Both lumens were found to aspirate and flush well. The proper amount of heparin was flushed in both lumens. The vein dermatotomy site was closed using a single layer of absorbable suture and Dermabond. The catheter was secured to the skin using Prolene suture. IMPRESSION: Successful placement of a right jugular tunneled dialysis catheter using ultrasound and fluoroscopic guidance. Catheter tip at the junction of superior vena cava and right atrium. Electronically Signed   By: Markus Daft M.D.   On: 11/05/2017 10:50   Ir US Guide Vasc Access Right  Result Date: 11/05/2017 INDICATION: 72 year old with end-stage renal disease. Patient needs a tunneled dialysis catheter while  the left arm fistula matures. Patient currently has non tunneled dialysis catheter. EXAM: FLUOROSCOPIC AND ULTRASOUND GUIDED PLACEMENT OF A TUNNELED DIALYSIS CATHETER Physician: Stephan Minister. Anselm Pancoast, MD MEDICATIONS: Vancomycin 1 gm IV; The antibiotic was administered within an appropriate time interval prior to skin puncture. ANESTHESIA/SEDATION: Versed 2 mg IV; Fentanyl 100 mcg IV; Moderate Sedation Time:  30 minutes The patient was continuously monitored during the procedure by the interventional radiology nurse under my direct supervision. FLUOROSCOPY TIME:  Fluoroscopy Time: 18 seconds, 3 mGy COMPLICATIONS: None immediate. PROCEDURE: The procedure was explained to the patient. The risks and benefits of the procedure were discussed and the patient's questions were addressed. Informed consent was obtained from the patient. The existing right neck catheter was removed with manual compression.  The patient was placed supine on the interventional table. Ultrasound confirmed a patent right internal jugular vein. Ultrasound images were obtained for documentation. The right side of the neck and chest was prepped and draped in a sterile fashion. The right neck was anesthetized with 1% lidocaine. Maximal barrier sterile technique was utilized including caps, mask, sterile gowns, sterile gloves, sterile drape, hand hygiene and skin antiseptic. A small incision was made with #11 blade scalpel. A 21 gauge needle directed into the right internal jugular vein with ultrasound guidance. A micropuncture dilator set was placed. A 23 cm tip to cuff Palindrome catheter was selected. The skin below the right clavicle was anesthetized and a small incision was made with an #11 blade scalpel. A subcutaneous tunnel was formed to the vein dermatotomy site. The catheter was brought through the tunnel. The vein dermatotomy site was dilated to accommodate a peel-away sheath. The catheter was placed through the peel-away sheath and directed into the  central venous structures. The tip of the catheter was placed at the superior cavoatrial junction with fluoroscopy. Fluoroscopic images were obtained for documentation. Both lumens were found to aspirate and flush well. The proper amount of heparin was flushed in both lumens. The vein dermatotomy site was closed using a single layer of absorbable suture and Dermabond. The catheter was secured to the skin using Prolene suture. IMPRESSION: Successful placement of a right jugular tunneled dialysis catheter using ultrasound and fluoroscopic guidance. Catheter tip at the junction of superior vena cava and right atrium. Electronically Signed   By: Markus Daft M.D.   On: 11/05/2017 10:50   Medications: . sodium chloride     . allopurinol  50 mg Oral Daily  . chlorhexidine      . Chlorhexidine Gluconate Cloth  6 each Topical Q0600  . ciprofloxacin  500 mg Oral Daily  . darbepoetin (ARANESP) injection - DIALYSIS  40 mcg Intravenous Q Fri-HD  . fentaNYL      . hydrALAZINE  50 mg Oral TID  . insulin aspart  0-9 Units Subcutaneous TID WC  . insulin glargine  16 Units Subcutaneous QHS  . lidocaine      . Melatonin  3 mg Oral QHS  . metoprolol tartrate  25 mg Oral BID  . midazolam      . pantoprazole  40 mg Oral QHS  . PARoxetine  40 mg Oral BH-q7a  . rosuvastatin  10 mg Oral QHS   Assessment/Plan: 1.    Progressive CKD 5 now ESRD  Has Chair [facility] 2nd Shift at [facility]  Has healing 2nd stage BVT  Temp HD cath placed 10/9; will tunnel when INR < 1.5; possibly today  2.  Anasarca - cont UF and serially lowering post HD weight  4.   Anemia - Started ESA with HD, ferritin 278, needs iron saturation; try to add on  5.  Pleural effusion - will see now it goes with ultrafiltration on HD  6.  Diastolic heart failure - as above  7.. Atrial fibrillation - on metoprolol, rate controlled; coumadin held.  8.  Enterobacter bacteremia - suspected urinary source. ? cefepine allergy, on cipro now;  surveillance Cx NG at 5d  9.  HTN - normotensive. on hydralazine 50 TID, MTP 25 BID  10.  DM - per primary   11. S/p 2nd stage BVT 10/23/17 with [facility]  12. CKD BMD: P at goal, PTH 146, no medications indicated  If patient receives tunneled dialysis catheter today, can discharge to  first treatment on Tuesday 10/15 at [facility] MD 11/05/2017, 11:51 AM  [place]

## 2017-11-06 ENCOUNTER — Ambulatory Visit: Payer: Medicare Other | Admitting: Internal Medicine

## 2017-11-07 DIAGNOSIS — Z4901 Encounter for fitting and adjustment of extracorporeal dialysis catheter: Secondary | ICD-10-CM | POA: Diagnosis not present

## 2017-11-07 DIAGNOSIS — Z111 Encounter for screening for respiratory tuberculosis: Secondary | ICD-10-CM | POA: Diagnosis not present

## 2017-11-07 DIAGNOSIS — N186 End stage renal disease: Secondary | ICD-10-CM | POA: Diagnosis not present

## 2017-11-07 DIAGNOSIS — D689 Coagulation defect, unspecified: Secondary | ICD-10-CM | POA: Diagnosis not present

## 2017-11-07 DIAGNOSIS — E1122 Type 2 diabetes mellitus with diabetic chronic kidney disease: Secondary | ICD-10-CM | POA: Diagnosis not present

## 2017-11-07 DIAGNOSIS — D509 Iron deficiency anemia, unspecified: Secondary | ICD-10-CM | POA: Diagnosis not present

## 2017-11-07 DIAGNOSIS — N2581 Secondary hyperparathyroidism of renal origin: Secondary | ICD-10-CM | POA: Diagnosis not present

## 2017-11-07 DIAGNOSIS — I482 Chronic atrial fibrillation, unspecified: Secondary | ICD-10-CM | POA: Diagnosis not present

## 2017-11-07 LAB — HEMOGLOBIN A1C
Hgb A1c MFr Bld: 7 % — ABNORMAL HIGH (ref 4.8–5.6)
MEAN PLASMA GLUCOSE: 154 mg/dL

## 2017-11-08 ENCOUNTER — Telehealth: Payer: Self-pay

## 2017-11-08 DIAGNOSIS — E1140 Type 2 diabetes mellitus with diabetic neuropathy, unspecified: Secondary | ICD-10-CM | POA: Diagnosis not present

## 2017-11-08 DIAGNOSIS — E1151 Type 2 diabetes mellitus with diabetic peripheral angiopathy without gangrene: Secondary | ICD-10-CM | POA: Diagnosis not present

## 2017-11-08 DIAGNOSIS — I132 Hypertensive heart and chronic kidney disease with heart failure and with stage 5 chronic kidney disease, or end stage renal disease: Secondary | ICD-10-CM | POA: Diagnosis not present

## 2017-11-08 DIAGNOSIS — I5042 Chronic combined systolic (congestive) and diastolic (congestive) heart failure: Secondary | ICD-10-CM | POA: Diagnosis not present

## 2017-11-08 DIAGNOSIS — I2510 Atherosclerotic heart disease of native coronary artery without angina pectoris: Secondary | ICD-10-CM | POA: Diagnosis not present

## 2017-11-08 DIAGNOSIS — F329 Major depressive disorder, single episode, unspecified: Secondary | ICD-10-CM | POA: Diagnosis not present

## 2017-11-08 DIAGNOSIS — E1122 Type 2 diabetes mellitus with diabetic chronic kidney disease: Secondary | ICD-10-CM | POA: Diagnosis not present

## 2017-11-08 DIAGNOSIS — E11319 Type 2 diabetes mellitus with unspecified diabetic retinopathy without macular edema: Secondary | ICD-10-CM | POA: Diagnosis not present

## 2017-11-08 DIAGNOSIS — D631 Anemia in chronic kidney disease: Secondary | ICD-10-CM | POA: Diagnosis not present

## 2017-11-08 DIAGNOSIS — M109 Gout, unspecified: Secondary | ICD-10-CM | POA: Diagnosis not present

## 2017-11-08 DIAGNOSIS — N186 End stage renal disease: Secondary | ICD-10-CM | POA: Diagnosis not present

## 2017-11-08 DIAGNOSIS — I480 Paroxysmal atrial fibrillation: Secondary | ICD-10-CM | POA: Diagnosis not present

## 2017-11-08 DIAGNOSIS — M159 Polyosteoarthritis, unspecified: Secondary | ICD-10-CM | POA: Diagnosis not present

## 2017-11-08 NOTE — Telephone Encounter (Signed)
Christine Santos, PT with [facility], called for verbal orders:  HH PT 2x/week x 4 weeks HH aide 2x/week x 4 weeks Skilled nursing evaluation for skin issues and med management.  Call back is [phone]  Danley Danker, RN [facility] [place])

## 2017-11-09 ENCOUNTER — Telehealth: Payer: Self-pay | Admitting: Cardiovascular Disease

## 2017-11-09 DIAGNOSIS — N2581 Secondary hyperparathyroidism of renal origin: Secondary | ICD-10-CM | POA: Diagnosis not present

## 2017-11-09 DIAGNOSIS — D689 Coagulation defect, unspecified: Secondary | ICD-10-CM | POA: Diagnosis not present

## 2017-11-09 DIAGNOSIS — I482 Chronic atrial fibrillation, unspecified: Secondary | ICD-10-CM | POA: Diagnosis not present

## 2017-11-09 DIAGNOSIS — Z111 Encounter for screening for respiratory tuberculosis: Secondary | ICD-10-CM | POA: Diagnosis not present

## 2017-11-09 DIAGNOSIS — D509 Iron deficiency anemia, unspecified: Secondary | ICD-10-CM | POA: Diagnosis not present

## 2017-11-09 DIAGNOSIS — N186 End stage renal disease: Secondary | ICD-10-CM | POA: Diagnosis not present

## 2017-11-09 DIAGNOSIS — Z4901 Encounter for fitting and adjustment of extracorporeal dialysis catheter: Secondary | ICD-10-CM | POA: Diagnosis not present

## 2017-11-09 DIAGNOSIS — E1122 Type 2 diabetes mellitus with diabetic chronic kidney disease: Secondary | ICD-10-CM | POA: Diagnosis not present

## 2017-11-09 NOTE — Telephone Encounter (Signed)
New Message        Samatha with the kidney center wants to know if she should continue drawing the patients INR? Pls call and advise.

## 2017-11-09 NOTE — Telephone Encounter (Signed)
Returned a call to [place] with HD and she stated that the pt is being by them and she could draw the patient's INR's. Advsed taht since it is more than a 24 hr turn around on receiving the labs tand since the pt is fairly new to [place] that we will have the pt continue to come into the office for appts. She stated if ever needed that they can do it.

## 2017-11-09 NOTE — Telephone Encounter (Signed)
Verbal orders given as requested. 

## 2017-11-11 DIAGNOSIS — N2581 Secondary hyperparathyroidism of renal origin: Secondary | ICD-10-CM | POA: Diagnosis not present

## 2017-11-11 DIAGNOSIS — D689 Coagulation defect, unspecified: Secondary | ICD-10-CM | POA: Diagnosis not present

## 2017-11-11 DIAGNOSIS — D509 Iron deficiency anemia, unspecified: Secondary | ICD-10-CM | POA: Diagnosis not present

## 2017-11-11 DIAGNOSIS — Z111 Encounter for screening for respiratory tuberculosis: Secondary | ICD-10-CM | POA: Diagnosis not present

## 2017-11-11 DIAGNOSIS — Z4901 Encounter for fitting and adjustment of extracorporeal dialysis catheter: Secondary | ICD-10-CM | POA: Diagnosis not present

## 2017-11-11 DIAGNOSIS — I482 Chronic atrial fibrillation, unspecified: Secondary | ICD-10-CM | POA: Diagnosis not present

## 2017-11-11 DIAGNOSIS — E1122 Type 2 diabetes mellitus with diabetic chronic kidney disease: Secondary | ICD-10-CM | POA: Diagnosis not present

## 2017-11-11 DIAGNOSIS — N186 End stage renal disease: Secondary | ICD-10-CM | POA: Diagnosis not present

## 2017-11-13 ENCOUNTER — Telehealth: Payer: Self-pay

## 2017-11-13 ENCOUNTER — Telehealth: Payer: Self-pay | Admitting: *Deleted

## 2017-11-13 DIAGNOSIS — R197 Diarrhea, unspecified: Secondary | ICD-10-CM

## 2017-11-13 NOTE — Telephone Encounter (Signed)
Patient left voicemail on Triage voicemail asking that Dr. Baxter Flattery give her a call back at [phone]. Attempted to call patient to find out more information regarding message. Unable to reach patient at this time left voicemail to call office back for more information. [place]

## 2017-11-13 NOTE — Telephone Encounter (Signed)
Patient's sister returning call from earlier this morning. Stated patient has been having diarrhea for four days now, and has gotten worse in the past two days. Patient's sister would like a call if any medication is prescribed at [phone] since she is picking up everything for the patient. Will route message to Dr. Baxter Flattery to advise.

## 2017-11-13 NOTE — Telephone Encounter (Signed)
Patient called to inform us that she had been in the hospital and she that she took some antibiotics. She took Cipro 500mg  for two days and has since completed them. Also, she stated she was receiving [facility] services and I called them and they have pt for PT and OT only at this time and RN visits scheduled at this time so pt was okay with making an appt in the office. Also, pt will call & update [place] if they call to schedule RN visits and confirmed she has our number.

## 2017-11-13 NOTE — Telephone Encounter (Signed)
Patient sister, [name], has left message asking for possible medication for diarrhea x 4 days for patient. Has left message for Dr Baxter Flattery of ID also.  [name] [phone] [name] [phone]  Danley Danker, RN [facility] RN)

## 2017-11-14 ENCOUNTER — Other Ambulatory Visit: Payer: Self-pay | Admitting: Family Medicine

## 2017-11-14 ENCOUNTER — Other Ambulatory Visit: Payer: Self-pay | Admitting: Internal Medicine

## 2017-11-14 DIAGNOSIS — D509 Iron deficiency anemia, unspecified: Secondary | ICD-10-CM | POA: Diagnosis not present

## 2017-11-14 DIAGNOSIS — N186 End stage renal disease: Secondary | ICD-10-CM | POA: Diagnosis not present

## 2017-11-14 DIAGNOSIS — Z4901 Encounter for fitting and adjustment of extracorporeal dialysis catheter: Secondary | ICD-10-CM | POA: Diagnosis not present

## 2017-11-14 DIAGNOSIS — I482 Chronic atrial fibrillation, unspecified: Secondary | ICD-10-CM | POA: Diagnosis not present

## 2017-11-14 DIAGNOSIS — D689 Coagulation defect, unspecified: Secondary | ICD-10-CM | POA: Diagnosis not present

## 2017-11-14 DIAGNOSIS — N2581 Secondary hyperparathyroidism of renal origin: Secondary | ICD-10-CM | POA: Diagnosis not present

## 2017-11-14 DIAGNOSIS — Z111 Encounter for screening for respiratory tuberculosis: Secondary | ICD-10-CM | POA: Diagnosis not present

## 2017-11-14 DIAGNOSIS — R197 Diarrhea, unspecified: Secondary | ICD-10-CM

## 2017-11-14 DIAGNOSIS — E1122 Type 2 diabetes mellitus with diabetic chronic kidney disease: Secondary | ICD-10-CM | POA: Diagnosis not present

## 2017-11-14 MED ORDER — VANCOMYCIN HCL 250 MG PO CAPS: 250.0000 mg | ORAL_CAPSULE | Freq: Four times a day (QID) | ORAL | 1 refills | Status: DC

## 2017-11-14 MED FILL — VANCOMYCIN HCL 250 MG CAP: 250 | 10 days supply | Qty: 40 | Fill #0

## 2017-11-14 NOTE — Progress Notes (Signed)
See phone note

## 2017-11-14 NOTE — Telephone Encounter (Signed)
Presumably, the C diff has returned due to the hospitalization and iv antibiotics for urosepsis.  Dialysis today and she doubts that she can sit through.  Immodium for dialysis only.  Test for C diff - proof would help determine next steps although it is very likely based on symptoms, history and situation.  Start Vanc.  Defer to ID to determine how long to be on med and how to taper.

## 2017-11-14 NOTE — Telephone Encounter (Signed)
Patient sister called and advised she will take the patient to the ED as requested by [facility].

## 2017-11-14 NOTE — Telephone Encounter (Signed)
I spoke to her sister, [name], to have them come to the ED for evaluation. I worry about dehydration/electrolyte imbalance due to diarrhea.

## 2017-11-15 ENCOUNTER — Ambulatory Visit (INDEPENDENT_AMBULATORY_CARE_PROVIDER_SITE_OTHER): Payer: Medicare Other | Admitting: Pharmacist

## 2017-11-15 ENCOUNTER — Telehealth: Payer: Self-pay

## 2017-11-15 DIAGNOSIS — Z7901 Long term (current) use of anticoagulants: Secondary | ICD-10-CM

## 2017-11-15 DIAGNOSIS — I480 Paroxysmal atrial fibrillation: Secondary | ICD-10-CM

## 2017-11-15 LAB — POCT INR: INR: 2.5 (ref 2.0–3.0)

## 2017-11-15 MED ORDER — INSULIN GLARGINE 100 UNIT/ML SOLOSTAR PEN: 12.0000 [IU] | PEN_INJECTOR | Freq: Every day | SUBCUTANEOUS | 12 refills | Status: DC

## 2017-11-15 MED ORDER — WARFARIN SODIUM 5 MG PO TABS: ORAL_TABLET | ORAL | 1 refills | Status: DC

## 2017-11-15 NOTE — Telephone Encounter (Signed)
Only low BS in mornings.  Decreased lantus from 16 to 12 units.  Continue to monitor BS

## 2017-11-15 NOTE — Telephone Encounter (Signed)
Received a call from patient's sister who stated Christine Santos is unable to give a stool sample as requested. Was wondering if there was an alternative test that could be done for patient. Patient's sister would like a call back from Dr. Baxter Flattery on what she can do next. [place]

## 2017-11-15 NOTE — Patient Instructions (Signed)
Description   Continue taking 1/2 tablet daily except 1 tablet on Mondays, Wednesdays, and Fridays. Recheck INR in 1 week. Has C diff - on oral vancomycin through 11/1 (will not affect INR). Call with any new medications or procedures-Coumadin Clinic # [phone] Main [phone]

## 2017-11-15 NOTE — Telephone Encounter (Signed)
Patient's sister stopped by. Patient's fasting blood sugar last several mornings has been in the 50s, possibly related to her dialysis. Wants to know if her insulin needs to be adjusted.  Call patient at [phone]  Danley Danker, RN [facility] [place])

## 2017-11-16 DIAGNOSIS — Z111 Encounter for screening for respiratory tuberculosis: Secondary | ICD-10-CM | POA: Diagnosis not present

## 2017-11-16 DIAGNOSIS — E1122 Type 2 diabetes mellitus with diabetic chronic kidney disease: Secondary | ICD-10-CM | POA: Diagnosis not present

## 2017-11-16 DIAGNOSIS — N186 End stage renal disease: Secondary | ICD-10-CM | POA: Diagnosis not present

## 2017-11-16 DIAGNOSIS — D509 Iron deficiency anemia, unspecified: Secondary | ICD-10-CM | POA: Diagnosis not present

## 2017-11-16 DIAGNOSIS — D689 Coagulation defect, unspecified: Secondary | ICD-10-CM | POA: Diagnosis not present

## 2017-11-16 DIAGNOSIS — I482 Chronic atrial fibrillation, unspecified: Secondary | ICD-10-CM | POA: Diagnosis not present

## 2017-11-16 DIAGNOSIS — Z4901 Encounter for fitting and adjustment of extracorporeal dialysis catheter: Secondary | ICD-10-CM | POA: Diagnosis not present

## 2017-11-16 DIAGNOSIS — N2581 Secondary hyperparathyroidism of renal origin: Secondary | ICD-10-CM | POA: Diagnosis not present

## 2017-11-17 NOTE — Addendum Note (Signed)
Addended by: Concepcion Elk on: 11/17/2017 11:36 AM   Modules accepted: Orders

## 2017-11-18 DIAGNOSIS — D509 Iron deficiency anemia, unspecified: Secondary | ICD-10-CM | POA: Diagnosis not present

## 2017-11-18 DIAGNOSIS — N2581 Secondary hyperparathyroidism of renal origin: Secondary | ICD-10-CM | POA: Diagnosis not present

## 2017-11-18 DIAGNOSIS — Z4901 Encounter for fitting and adjustment of extracorporeal dialysis catheter: Secondary | ICD-10-CM | POA: Diagnosis not present

## 2017-11-18 DIAGNOSIS — I482 Chronic atrial fibrillation, unspecified: Secondary | ICD-10-CM | POA: Diagnosis not present

## 2017-11-18 DIAGNOSIS — E1122 Type 2 diabetes mellitus with diabetic chronic kidney disease: Secondary | ICD-10-CM | POA: Diagnosis not present

## 2017-11-18 DIAGNOSIS — N186 End stage renal disease: Secondary | ICD-10-CM | POA: Diagnosis not present

## 2017-11-18 DIAGNOSIS — D689 Coagulation defect, unspecified: Secondary | ICD-10-CM | POA: Diagnosis not present

## 2017-11-18 DIAGNOSIS — Z111 Encounter for screening for respiratory tuberculosis: Secondary | ICD-10-CM | POA: Diagnosis not present

## 2017-11-20 ENCOUNTER — Telehealth: Payer: Self-pay | Admitting: *Deleted

## 2017-11-20 ENCOUNTER — Telehealth: Payer: Self-pay | Admitting: Interventional Cardiology

## 2017-11-20 NOTE — Telephone Encounter (Signed)
   atient went to dialysis on Saturday and her HR was running high. She is concerned because it is still running high and would like to know what to do. HR was 114 this morning.

## 2017-11-20 NOTE — Telephone Encounter (Signed)
Pt sister called and lmovm for callback ( office was at lunch).  Returned call, she wanted to know if the lab results came back.  Advised that they were not back yet.  She also ask that I give her sister a call because "she was not feeling well this am"    Called pt.  She states that she was feeling bad this am and called triage @ [place] that she had not been called back but there was no message in her chart and the only message I received was from her sister @ lunchtime.  Pt is understanding and by the end of the call she states that she was feeling better than this am and was "glad I called".  Advised to call back or go to the ED if her heart rate increased again or if she was SOB. Fleeger, Salome Spotted, CMA

## 2017-11-20 NOTE — Telephone Encounter (Signed)
Returned call to patient who states that her HR was elevated at Dialysis on Saturday at 116 bpm. She states that her BP was 140/70. Patient states that her HR was elevated at 114 bpm this morning. She states that this was before she had her morning meds (metoprolol). Patient rechecked HR while on the phone at it was 76 bpm. Patient denies any Sx ant this time. Patient will let us know if she has a change in her Sx or if her HR remains elevated after she has had her meds.

## 2017-11-20 NOTE — Telephone Encounter (Signed)
Noted and agree. 

## 2017-11-21 DIAGNOSIS — Z4901 Encounter for fitting and adjustment of extracorporeal dialysis catheter: Secondary | ICD-10-CM | POA: Diagnosis not present

## 2017-11-21 DIAGNOSIS — I482 Chronic atrial fibrillation, unspecified: Secondary | ICD-10-CM | POA: Diagnosis not present

## 2017-11-21 DIAGNOSIS — Z111 Encounter for screening for respiratory tuberculosis: Secondary | ICD-10-CM | POA: Diagnosis not present

## 2017-11-21 DIAGNOSIS — D689 Coagulation defect, unspecified: Secondary | ICD-10-CM | POA: Diagnosis not present

## 2017-11-21 DIAGNOSIS — N186 End stage renal disease: Secondary | ICD-10-CM | POA: Diagnosis not present

## 2017-11-21 DIAGNOSIS — D509 Iron deficiency anemia, unspecified: Secondary | ICD-10-CM | POA: Diagnosis not present

## 2017-11-21 DIAGNOSIS — E1122 Type 2 diabetes mellitus with diabetic chronic kidney disease: Secondary | ICD-10-CM | POA: Diagnosis not present

## 2017-11-21 DIAGNOSIS — N2581 Secondary hyperparathyroidism of renal origin: Secondary | ICD-10-CM | POA: Diagnosis not present

## 2017-11-21 NOTE — Telephone Encounter (Signed)
Called.  OK to replace fluids lost by diarrhea as extra intake above the regular 32 oz.

## 2017-11-21 NOTE — Telephone Encounter (Signed)
Pt sister calls this am.   Her sister has diarrhea (likely associated with vancomycin)   She has dialysis @ 12pm today and wants to know what to do.  She is only allowed 32 oz of water, so trouble with staying hydrated.  Will forward to MD for advice   Shanara Schnieders, Salome Spotted, [place]

## 2017-11-22 ENCOUNTER — Ambulatory Visit: Payer: Medicare Other | Admitting: Pharmacist

## 2017-11-22 ENCOUNTER — Telehealth: Payer: Self-pay | Admitting: Family Medicine

## 2017-11-22 DIAGNOSIS — Z7901 Long term (current) use of anticoagulants: Secondary | ICD-10-CM | POA: Diagnosis not present

## 2017-11-22 DIAGNOSIS — I480 Paroxysmal atrial fibrillation: Secondary | ICD-10-CM | POA: Diagnosis not present

## 2017-11-22 DIAGNOSIS — R197 Diarrhea, unspecified: Secondary | ICD-10-CM

## 2017-11-22 LAB — PROTIME-INR

## 2017-11-22 LAB — POCT INR: INR: 8 — AB (ref 2.0–3.0)

## 2017-11-22 MED ORDER — VANCOMYCIN HCL 250 MG PO CAPS: 250.0000 mg | ORAL_CAPSULE | Freq: Four times a day (QID) | ORAL | 1 refills | Status: DC

## 2017-11-22 MED ORDER — PHYTONADIONE 5 MG PO TABS: 5.0000 mg | ORAL_TABLET | Freq: Once | ORAL | 0 refills | Status: AC

## 2017-11-22 MED FILL — VANCOMYCIN HCL 250 MG CAP: 250 | 10 days supply | Qty: 40 | Fill #0

## 2017-11-22 NOTE — Telephone Encounter (Signed)
Received call from home health that patient was weak and had borderline pulse ox. Patient reports good dialysis yesterday.  Wt=152 today and best guess ideal weight =150.  No fever or chills.  Yes, weak, but that is not new.  Does not complain of CP or SOB.  Per nurse, two pulse ox readings were 89 & 88%.  Will continue to observe at home.  Patient and spouse know indications for ER.

## 2017-11-22 NOTE — Progress Notes (Signed)
$  26.40 

## 2017-11-22 NOTE — Patient Instructions (Signed)
Description   Do not take any Coumadin until we call you. Go to the ER with any bleeding.

## 2017-11-23 ENCOUNTER — Telehealth: Payer: Self-pay | Admitting: *Deleted

## 2017-11-23 DIAGNOSIS — I482 Chronic atrial fibrillation, unspecified: Secondary | ICD-10-CM | POA: Diagnosis not present

## 2017-11-23 DIAGNOSIS — N186 End stage renal disease: Secondary | ICD-10-CM | POA: Diagnosis not present

## 2017-11-23 DIAGNOSIS — D689 Coagulation defect, unspecified: Secondary | ICD-10-CM | POA: Diagnosis not present

## 2017-11-23 DIAGNOSIS — Z111 Encounter for screening for respiratory tuberculosis: Secondary | ICD-10-CM | POA: Diagnosis not present

## 2017-11-23 DIAGNOSIS — Z4901 Encounter for fitting and adjustment of extracorporeal dialysis catheter: Secondary | ICD-10-CM | POA: Diagnosis not present

## 2017-11-23 DIAGNOSIS — N2581 Secondary hyperparathyroidism of renal origin: Secondary | ICD-10-CM | POA: Diagnosis not present

## 2017-11-23 DIAGNOSIS — Z992 Dependence on renal dialysis: Secondary | ICD-10-CM | POA: Diagnosis not present

## 2017-11-23 DIAGNOSIS — D509 Iron deficiency anemia, unspecified: Secondary | ICD-10-CM | POA: Diagnosis not present

## 2017-11-23 DIAGNOSIS — E1122 Type 2 diabetes mellitus with diabetic chronic kidney disease: Secondary | ICD-10-CM | POA: Diagnosis not present

## 2017-11-23 LAB — CLOSTRIDIUM DIFFICILE BY PCR

## 2017-11-23 NOTE — Telephone Encounter (Signed)
Manuela Schwartz from [facility] calling for OT verbal orders as follows:  1 time(s) weekly for 1 week(s), then 2 time(s) weekly for 3 week(s), lastly 1 time(s) weekly for 1 week(s).  You can leave verbal orders on confidential voicemail.  Fleeger, Salome Spotted, CMA

## 2017-11-24 ENCOUNTER — Ambulatory Visit: Payer: Medicare Other | Admitting: Pharmacist

## 2017-11-24 DIAGNOSIS — Z7901 Long term (current) use of anticoagulants: Secondary | ICD-10-CM | POA: Diagnosis not present

## 2017-11-24 DIAGNOSIS — I480 Paroxysmal atrial fibrillation: Secondary | ICD-10-CM | POA: Diagnosis not present

## 2017-11-24 LAB — POCT INR: INR: 1.9 — AB (ref 2.0–3.0)

## 2017-11-24 NOTE — Patient Instructions (Signed)
Description   Take your normal 1 tablet today, then start taking 1/2 tablet of warfarin daily while you are on vancomycin for your C diff. Recheck INR in 1 week.

## 2017-11-25 DIAGNOSIS — Z4901 Encounter for fitting and adjustment of extracorporeal dialysis catheter: Secondary | ICD-10-CM | POA: Diagnosis not present

## 2017-11-25 DIAGNOSIS — N186 End stage renal disease: Secondary | ICD-10-CM | POA: Diagnosis not present

## 2017-11-25 DIAGNOSIS — D689 Coagulation defect, unspecified: Secondary | ICD-10-CM | POA: Diagnosis not present

## 2017-11-25 DIAGNOSIS — N2581 Secondary hyperparathyroidism of renal origin: Secondary | ICD-10-CM | POA: Diagnosis not present

## 2017-11-27 ENCOUNTER — Other Ambulatory Visit: Payer: Self-pay | Admitting: *Deleted

## 2017-11-27 ENCOUNTER — Telehealth: Payer: Self-pay

## 2017-11-27 DIAGNOSIS — R197 Diarrhea, unspecified: Secondary | ICD-10-CM

## 2017-11-27 NOTE — Telephone Encounter (Signed)
Noted and agree. 

## 2017-11-27 NOTE — Telephone Encounter (Signed)
Christine Santos called nurse line requesting results that were collected over a week ago. Per [facility] and [place] was lost in their facility.  Precepted with Hensel and he recommends a re collect.   Called Christine Santos and informed her of the loss and apologized. [name] will come back by and and get sample kit for a recollect.  I offered [name] an apt with Hensel this afternoon for [name], she declined and collect the new sample as soon as possible.

## 2017-11-27 NOTE — Telephone Encounter (Signed)
Manuela Schwartz called back to check status. Verbal orders ok'd by Hensel who is precepting today. Fleeger, Salome Spotted, CMA

## 2017-11-28 DIAGNOSIS — N2581 Secondary hyperparathyroidism of renal origin: Secondary | ICD-10-CM | POA: Diagnosis not present

## 2017-11-28 DIAGNOSIS — Z4901 Encounter for fitting and adjustment of extracorporeal dialysis catheter: Secondary | ICD-10-CM | POA: Diagnosis not present

## 2017-11-28 DIAGNOSIS — N186 End stage renal disease: Secondary | ICD-10-CM | POA: Diagnosis not present

## 2017-11-28 DIAGNOSIS — D689 Coagulation defect, unspecified: Secondary | ICD-10-CM | POA: Diagnosis not present

## 2017-11-29 ENCOUNTER — Telehealth: Payer: Self-pay | Admitting: *Deleted

## 2017-11-29 MED ORDER — INSULIN GLARGINE 100 UNIT/ML SOLOSTAR PEN: 10.0000 [IU] | PEN_INJECTOR | Freq: Every day | SUBCUTANEOUS | 12 refills | Status: DC

## 2017-11-29 NOTE — Telephone Encounter (Signed)
[facility] nurse is out at pt house now.  Pts CBG is 58.  According to [facility] RN her husband gave her 4 glucose tabs this am around 7am and they gave her one more a few minutes ago.  Her CBG is still 58.  Pt has not eaten today, even though husband says he has tried. Advised to eat something and drink some orange juice.   Per Dr. Andria Frames cut Lantus back from 12 units to 10 unit (changed in Epic).   Relayed to [facility] RN who will relay to patient and husband. Brendaly Townsel, Salome Spotted, CMA

## 2017-11-29 NOTE — Telephone Encounter (Signed)
I have discussed and agree with this plan.

## 2017-11-30 DIAGNOSIS — D689 Coagulation defect, unspecified: Secondary | ICD-10-CM | POA: Diagnosis not present

## 2017-11-30 DIAGNOSIS — N186 End stage renal disease: Secondary | ICD-10-CM | POA: Diagnosis not present

## 2017-11-30 DIAGNOSIS — N2581 Secondary hyperparathyroidism of renal origin: Secondary | ICD-10-CM | POA: Diagnosis not present

## 2017-11-30 DIAGNOSIS — Z4901 Encounter for fitting and adjustment of extracorporeal dialysis catheter: Secondary | ICD-10-CM | POA: Diagnosis not present

## 2017-11-30 LAB — GI PROFILE, STOOL, PCR
ASTROVIRUS: NOT DETECTED
Adenovirus F 40/41: NOT DETECTED
C difficile toxin A/B: NOT DETECTED
CAMPYLOBACTER: NOT DETECTED
Cryptosporidium: NOT DETECTED
Cyclospora cayetanensis: NOT DETECTED
ENTAMOEBA HISTOLYTICA: NOT DETECTED
ENTEROPATHOGENIC E COLI: NOT DETECTED
ENTEROTOXIGENIC E COLI: NOT DETECTED
Enteroaggregative E coli: NOT DETECTED
GIARDIA LAMBLIA: NOT DETECTED
NOROVIRUS GI/GII: NOT DETECTED
PLESIOMONAS SHIGELLOIDES: NOT DETECTED
Rotavirus A: NOT DETECTED
SHIGELLA/ENTEROINVASIVE E COLI: NOT DETECTED
Salmonella: NOT DETECTED
Sapovirus: NOT DETECTED
Shiga-toxin-producing E coli: NOT DETECTED
VIBRIO CHOLERAE: NOT DETECTED
Vibrio: NOT DETECTED
YERSINIA ENTEROCOLITICA: NOT DETECTED

## 2017-12-01 ENCOUNTER — Ambulatory Visit (INDEPENDENT_AMBULATORY_CARE_PROVIDER_SITE_OTHER): Payer: Medicare Other | Admitting: *Deleted

## 2017-12-01 DIAGNOSIS — Z7901 Long term (current) use of anticoagulants: Secondary | ICD-10-CM | POA: Diagnosis not present

## 2017-12-01 DIAGNOSIS — I480 Paroxysmal atrial fibrillation: Secondary | ICD-10-CM

## 2017-12-01 LAB — POCT INR: INR: 3.5 — AB (ref 2.0–3.0)

## 2017-12-01 NOTE — Patient Instructions (Signed)
Description   Hold today's dose today's dose then continue taking 1/2 tablet of warfarin daily while you are on vancomycin for your C diff. Recheck INR in 1 week. Coumadin Clinic [phone]

## 2017-12-02 DIAGNOSIS — N2581 Secondary hyperparathyroidism of renal origin: Secondary | ICD-10-CM | POA: Diagnosis not present

## 2017-12-02 DIAGNOSIS — D689 Coagulation defect, unspecified: Secondary | ICD-10-CM | POA: Diagnosis not present

## 2017-12-02 DIAGNOSIS — Z4901 Encounter for fitting and adjustment of extracorporeal dialysis catheter: Secondary | ICD-10-CM | POA: Diagnosis not present

## 2017-12-02 DIAGNOSIS — N186 End stage renal disease: Secondary | ICD-10-CM | POA: Diagnosis not present

## 2017-12-05 DIAGNOSIS — N186 End stage renal disease: Secondary | ICD-10-CM | POA: Diagnosis not present

## 2017-12-05 DIAGNOSIS — Z4901 Encounter for fitting and adjustment of extracorporeal dialysis catheter: Secondary | ICD-10-CM | POA: Diagnosis not present

## 2017-12-05 DIAGNOSIS — N2581 Secondary hyperparathyroidism of renal origin: Secondary | ICD-10-CM | POA: Diagnosis not present

## 2017-12-05 DIAGNOSIS — D689 Coagulation defect, unspecified: Secondary | ICD-10-CM | POA: Diagnosis not present

## 2017-12-07 DIAGNOSIS — Z4901 Encounter for fitting and adjustment of extracorporeal dialysis catheter: Secondary | ICD-10-CM | POA: Diagnosis not present

## 2017-12-07 DIAGNOSIS — N2581 Secondary hyperparathyroidism of renal origin: Secondary | ICD-10-CM | POA: Diagnosis not present

## 2017-12-07 DIAGNOSIS — N186 End stage renal disease: Secondary | ICD-10-CM | POA: Diagnosis not present

## 2017-12-07 DIAGNOSIS — D689 Coagulation defect, unspecified: Secondary | ICD-10-CM | POA: Diagnosis not present

## 2017-12-08 ENCOUNTER — Ambulatory Visit (INDEPENDENT_AMBULATORY_CARE_PROVIDER_SITE_OTHER): Payer: Medicare Other | Admitting: *Deleted

## 2017-12-08 DIAGNOSIS — I480 Paroxysmal atrial fibrillation: Secondary | ICD-10-CM

## 2017-12-08 DIAGNOSIS — Z7901 Long term (current) use of anticoagulants: Secondary | ICD-10-CM

## 2017-12-08 LAB — PROTIME-INR
INR: 7.8 — AB (ref 0.8–1.2)
PROTHROMBIN TIME: 73 s — AB (ref 9.1–12.0)

## 2017-12-08 LAB — POCT INR: INR: 7.3 — AB (ref 2.0–3.0)

## 2017-12-09 DIAGNOSIS — N2581 Secondary hyperparathyroidism of renal origin: Secondary | ICD-10-CM | POA: Diagnosis not present

## 2017-12-09 DIAGNOSIS — D689 Coagulation defect, unspecified: Secondary | ICD-10-CM | POA: Diagnosis not present

## 2017-12-09 DIAGNOSIS — Z4901 Encounter for fitting and adjustment of extracorporeal dialysis catheter: Secondary | ICD-10-CM | POA: Diagnosis not present

## 2017-12-09 DIAGNOSIS — N186 End stage renal disease: Secondary | ICD-10-CM | POA: Diagnosis not present

## 2017-12-11 ENCOUNTER — Encounter: Payer: Self-pay | Admitting: Family Medicine

## 2017-12-11 ENCOUNTER — Ambulatory Visit (INDEPENDENT_AMBULATORY_CARE_PROVIDER_SITE_OTHER): Payer: Medicare Other | Admitting: Family Medicine

## 2017-12-11 VITALS — BP 114/88 | HR 113 | Temp 98.1°F | Wt 152.6 lb

## 2017-12-11 DIAGNOSIS — E1121 Type 2 diabetes mellitus with diabetic nephropathy: Secondary | ICD-10-CM

## 2017-12-11 DIAGNOSIS — H6691 Otitis media, unspecified, right ear: Secondary | ICD-10-CM

## 2017-12-11 DIAGNOSIS — R2681 Unsteadiness on feet: Secondary | ICD-10-CM

## 2017-12-11 DIAGNOSIS — I5022 Chronic systolic (congestive) heart failure: Secondary | ICD-10-CM

## 2017-12-11 DIAGNOSIS — Z992 Dependence on renal dialysis: Secondary | ICD-10-CM

## 2017-12-11 DIAGNOSIS — N186 End stage renal disease: Secondary | ICD-10-CM

## 2017-12-11 MED ORDER — DOXYCYCLINE HYCLATE 100 MG PO TABS: 100.0000 mg | ORAL_TABLET | Freq: Two times a day (BID) | ORAL | 0 refills | Status: DC

## 2017-12-11 NOTE — Patient Instructions (Signed)
It was wonderful to see you today.  Thank you for choosing [facility].   Please call [phone] with any questions about today's appointment.  Please be sure to schedule follow up at the front  desk before you leave today.   Deneka Greenwalt, MD  Family Medicine    

## 2017-12-11 NOTE — Progress Notes (Signed)
Patient Name: Christine Santos Date of Birth: July 20, 1945 Date of Visit: 12/11/17 PCP: [name], MD  Chief Complaint: Right ear pain and cough  Subjective: Christine Santos is a pleasant 72 y.o. year old woman with history significant for paroxysmal atrial fibrillation on warfarin therapy, chronic lymphoblastic leukemia, type 2 diabetes, chronic kidney disease on dialysis therapy and recent hospital admission for gram-negative bacteremia.  The patient is joined by her sister today.  The patient reports she overall feels about the same as she did at time of discharge.  Last Friday she noted the onset of right ear pain along with reduction in her hearing on the right.  She endorses some jaw pain when she opens her mouth.  She was recently treated for a bacterial conjunctivitis.  This is improved she developed a dry nonproductive cough 1 day ago.  She reports she is eating and drinking normally.  She had Wheaties with whole milk this morning.  She has had no fevers.  She has been attending dialysis.  She denies chest pain, difficulty breathing, dyspnea with exertion, falls, headache, vision changes or change in her medications.  The patient was recently treated for C. difficile colitis with vancomycin.  Of note the patient lives with her partner of many years.  Her partner is legally blind and does not drive.  He has had progressive decline in his suffering acute illness himself.  The patient ambulates with a walker.  She requires assistance with most activities of daily living including ambulating bathing and dressing.  She has had no falls at home.  She lives in a single floor trailer.  Her sister is asking about hospice or palliative care today the patient is now interested in hospice care.  She wishes to continue dialysis and she also wishes to continue to pursue life-prolonging therapies.  With the patient and her sister both one in fact is a in-home aide for around-the-clock care and for driving to  locations.   ROS:  ROS As above I have reviewed the patient's medical, surgical, family, and social history as appropriate.   Vitals:   12/11/17 1522  BP: 114/88  Pulse: (!) 113  Temp: 98.1 F (36.7 C)  SpO2: 94%   Filed Weights   12/11/17 1522  Weight: 152 lb 9.6 oz (69.2 kg)   Pulse on repeat was 87.  Pulse extremity oximetry on repeat is 97% pulse ox when walking was 95% HEENT: Sclera anicteric. Dentures in place, bilateral eyes with conjunctival injection, R TM erythematous, bulging, pain with exam, purulent drainage behind ear. Appears well hydrated. Neck: Supple Cardiac: Regular rate and rhythm. Normal S1/S2. No murmurs, rubs, or gallops appreciated. Lungs: Coarse but clear breath sounds bilaterally, no egophony.  Abdomen: Normoactive bowel sounds. No tenderness to deep or light palpation. No rebound or guarding.  Extremities: 2-3+ pitting edema bilaterally Skin: Minimal erythema along bilateral lower extremities along lateral malleoli Psych: Tearful and tired appearing    Christine Santos was seen today for cough.  Diagnoses and all orders for this visit:  Overall Functional Decline the patient has had progressive decline since her recent hospital admission her gait today with her walker is quite unsteady.  She certainly is at risk for readmission given her gait instability comorbid diseases and current living situation.  Her caregiver is also chronically ill and is also currently suffering an acute illness.  We reviewed the role of palliative and hospice therapy.  The patient is not interested in this at this time.  The patient is interested in more conference of care through [facility].  A referral was placed today.  Chronic systolic heart failure (HCC) -     AMB REFERRAL TO [facility]ACE; Future -     Ambulatory referral to Home Health Diabetic nephropathy associated with type 2 diabetes mellitus ([place]) -     Ambulatory referral to [place] ESRD on dialysis ([place]) -     AMB REFERRAL TO  PACE; Future -     Ambulatory referral to Home Health Gait instability -     Ambulatory referral to [place]  Right acute otitis media most consistent with exam and symptoms.  Her right TM exam is impressive.  This is less common but can be seen in patients or immunosuppressive's as those on dialysis.  Given her allergies and recent bout with C difficile we will treat as below. Reviewed strict return precautions.  -     doxycycline (VIBRA-TABS) 100 MG tablet; Take 1 tablet (100 mg total) by mouth 2 (two) times daily.    Dorris Singh, MD  Family Medicine Teaching Service

## 2017-12-12 DIAGNOSIS — D689 Coagulation defect, unspecified: Secondary | ICD-10-CM | POA: Diagnosis not present

## 2017-12-12 DIAGNOSIS — N186 End stage renal disease: Secondary | ICD-10-CM | POA: Diagnosis not present

## 2017-12-12 DIAGNOSIS — Z4901 Encounter for fitting and adjustment of extracorporeal dialysis catheter: Secondary | ICD-10-CM | POA: Diagnosis not present

## 2017-12-12 DIAGNOSIS — N2581 Secondary hyperparathyroidism of renal origin: Secondary | ICD-10-CM | POA: Diagnosis not present

## 2017-12-13 ENCOUNTER — Other Ambulatory Visit: Payer: Self-pay

## 2017-12-13 ENCOUNTER — Other Ambulatory Visit: Payer: Self-pay | Admitting: *Deleted

## 2017-12-13 ENCOUNTER — Telehealth: Payer: Self-pay | Admitting: *Deleted

## 2017-12-13 ENCOUNTER — Ambulatory Visit (HOSPITAL_COMMUNITY)
Admission: RE | Admit: 2017-12-13 | Discharge: 2017-12-13 | Disposition: A | Discharge status: Home / Self Care | Payer: Medicare Other | Source: Ambulatory Visit | Attending: Vascular Surgery | Admitting: Vascular Surgery

## 2017-12-13 ENCOUNTER — Ambulatory Visit (INDEPENDENT_AMBULATORY_CARE_PROVIDER_SITE_OTHER): Payer: Medicare Other | Admitting: *Deleted

## 2017-12-13 ENCOUNTER — Encounter: Payer: Self-pay | Admitting: *Deleted

## 2017-12-13 ENCOUNTER — Ambulatory Visit (INDEPENDENT_AMBULATORY_CARE_PROVIDER_SITE_OTHER): Payer: Self-pay | Admitting: Physician Assistant

## 2017-12-13 VITALS — BP 144/90 | HR 91 | Temp 98.6°F | Resp 20 | Ht 65.5 in | Wt 152.0 lb

## 2017-12-13 DIAGNOSIS — N184 Chronic kidney disease, stage 4 (severe): Secondary | ICD-10-CM | POA: Insufficient documentation

## 2017-12-13 DIAGNOSIS — Z992 Dependence on renal dialysis: Secondary | ICD-10-CM

## 2017-12-13 DIAGNOSIS — N186 End stage renal disease: Secondary | ICD-10-CM

## 2017-12-13 DIAGNOSIS — I480 Paroxysmal atrial fibrillation: Secondary | ICD-10-CM

## 2017-12-13 DIAGNOSIS — Z7901 Long term (current) use of anticoagulants: Secondary | ICD-10-CM | POA: Diagnosis not present

## 2017-12-13 LAB — POCT INR: INR: 2.6 (ref 2.0–3.0)

## 2017-12-13 NOTE — Telephone Encounter (Signed)
Patient with diagnosis of afib on warfarin for anticoagulation.    Procedure: fistulogram Date of procedure:12/25/17  CHADS2-VASc score of  7 (CHF, HTN, AGE, DM2, stroke/tia x 2, CAD, AGE, female)  Pending dialysis. Per office protocol, patient can hold warfarin for 5 days prior to procedure with bridging with Lovenox (enoxaparin) around procedure.

## 2017-12-13 NOTE — Progress Notes (Signed)
POST OPERATIVE OFFICE NOTE    CC:  F/u for surgery  HPI:  This is a 72 y.o. female who is s/p 2nd stage left arm basilic vein transposition on 10/23/17 by Dr. Oneida Alar.  The first stage was originally done on 08/23/17 by Dr. Oneida Alar.  She presents today for follow up.  She states that she does not feel that well today and has an ear infection in the right ear.    She denies any pain in her left hand.  She dialyzes on T/T/S at the [facility] location and Dr. Joelyn Oms is her nephrologist.  She currently dialyzes via a right IJ [facility] placed by interventional radiology on 11/05/17.    She is on Coumadin and her INR last week was 7.8.  She was instructed to stop the coumadin until last night.  She is having her INR checked again this afternoon at the coumadin clinic at [facility] coumadin clinic.    Allergies  Allergen Reactions  . Cefepime Anaphylaxis    Throat swelling  . Other Other (See Comments)    Darvocet-rash   . Amoxicillin Rash    Has patient had a PCN reaction causing immediate rash, facial/tongue/throat swelling, SOB or lightheadedness with hypotension: No Has patient had a PCN reaction causing severe rash involving mucus membranes or skin necrosis: No Has patient had a PCN reaction that required hospitalization: No Has patient had a PCN reaction occurring within the last 10 years: No If all of the above answers are "NO", then may proceed with Cephalosporin use.   . Codeine Phosphate Rash  . Penicillins Rash    Has patient had a PCN reaction causing immediate rash, facial/tongue/throat swelling, SOB or lightheadedness with hypotension: No Has patient had a PCN reaction causing severe rash involving mucus membranes or skin necrosis: No Has patient had a PCN reaction that required hospitalization: No Has patient had a PCN reaction occurring within the last 10 years: No If all of the above answers are "NO", then may proceed with Cephalosporin use.      Current Outpatient  Medications  Medication Sig Dispense Refill  . ACCU-CHEK AVIVA PLUS test strip TEST THREE TIMES A DAY 300 each 3  . allopurinol (ZYLOPRIM) 100 MG tablet Take 0.5 tablets (50 mg total) by mouth daily. 30 tablet 0  . Ascorbic Acid (VITAMIN C) 100 MG tablet Take 100 mg by mouth daily.    . carboxymethylcellulose (REFRESH PLUS) 0.5 % SOLN Place 1 drop into both eyes 3 (three) times daily as needed (dry eyes).     Marland Kitchen doxycycline (VIBRA-TABS) 100 MG tablet Take 1 tablet (100 mg total) by mouth 2 (two) times daily. 14 tablet 0  . ferrous sulfate 325 (65 FE) MG tablet Take 1 tablet (325 mg total) by mouth daily with breakfast.  3  . GNP GARLIC EXTRACT PO Take 1 tablet by mouth daily.     Marland Kitchen HUMALOG KWIKPEN 100 UNIT/ML KiwkPen Inject 5 Units as directed 3 (three) times daily after meals.   3  . hydrALAZINE (APRESOLINE) 50 MG tablet TAKE 1 TABLET BY MOUTH THREE TIMES DAILY (Patient taking differently: Take 50 mg by mouth 3 (three) times daily. ) 270 tablet 3  . Insulin Glargine (LANTUS SOLOSTAR) 100 UNIT/ML Solostar Pen Inject 10 Units into the skin daily. 15 mL 12  . Melatonin 10 MG CAPS Take 10 mg by mouth at bedtime as needed (sleep).     . metoprolol tartrate (LOPRESSOR) 25 MG tablet Take 1 tablet (25 mg  total) by mouth 2 (two) times daily. 30 tablet 0  . Multiple Vitamins-Minerals (EYE VITAMINS PO) Take 1 capsule by mouth daily. Taking one daily from her opthamologist (Dr. Herbert Deaner)     . Multiple Vitamins-Minerals (MULTIVITAMINS THER. W/MINERALS) TABS Take 1 tablet by mouth daily.      Marland Kitchen omega-3 acid ethyl esters (LOVAZA) 1 g capsule Take 2 g by mouth 2 (two) times daily.     . ondansetron (ZOFRAN) 4 MG tablet Take 1 tablet (4 mg total) by mouth every 8 (eight) hours as needed for nausea or vomiting. 20 tablet 1  . pantoprazole (PROTONIX) 40 MG tablet TAKE 1 TABLET BY MOUTH DAILY (Patient taking differently: Take 40 mg by mouth at bedtime. ) 90 tablet 3  . PARoxetine (PAXIL) 40 MG tablet Take 1 tablet (40  mg total) by mouth every morning. 90 tablet 3  . rosuvastatin (CRESTOR) 10 MG tablet Take 1 tablet (10 mg total) by mouth at bedtime. 30 tablet 0  . traMADol (ULTRAM) 50 MG tablet Take 1 tablet (50 mg total) by mouth every 12 (twelve) hours as needed (for pain.). 1 tablet   . vancomycin (VANCOCIN) 250 MG capsule Take 1 capsule (250 mg total) by mouth 4 (four) times daily. 40 capsule 1  . warfarin (COUMADIN) 5 MG tablet Take 1/2 to 1 tablet daily as directed by Coumadin clinic 30 tablet 1   No current facility-administered medications for this visit.      ROS:  See HPI  Physical Exam:  Today's Vitals   12/13/17 1355  BP: (!) 144/90  Pulse: 91  Resp: 20  Temp: 98.6 F (37 C)  TempSrc: Oral  SpO2: 93%  Weight: 152 lb (68.9 kg)  Height: 5' 5.5" (1.664 m)   Body mass index is 24.91 kg/m.   Incision:  Incisions have healed nicely. Extremities:  + palpable left radial pulse; thrill/bruit within fistula but is difficult to palpate and somewhat posterior.      Assessment/Plan:  This is a 72 y.o. female who is s/p: 2nd stage left arm basilic vein transposition on 10/23/17 by Dr. Oneida Alar  -pt's left arm basilic vein transposition is difficult to palpate but does have thrill/bruit throughout.  Examined with Dr. Oneida Alar and will get duplex of access today.  -duplex obtained this afternoon of left BVT reveals diameter ranging from 0.17cm-0.52cm.  There is a stenosis ~ 1cm from the anastomosis and is 2cm in length.  There is also a fluid collection at the antecubital fossa.   Will plan for fistulogram of fistula next week.  Pt and family members upset about having to have another procedure and possibly new access.  Discussed with pt and family members that fistulas will sometimes need additional procedures to help mature and she may even need new access.   -pt is on coumadin with hx of PAF.  Her INR last week was 7.8.  She was instructed to hold coumadin until last night and is having her INR  checked today.  Will plan for fistulogram most likely next week on M//W/F given she dialyzes on T/T/S.   Discussed with pt and family that if INR is too high, her procedure cannot be performed.    Leontine Locket, PA-C [facility] [phone]  Clinic MD:  Oneida Alar

## 2017-12-13 NOTE — Patient Instructions (Signed)
Description   Start taking 1/2 tablet daily while on Doxycycline. Recheck INR on Monday. Coumadin Clinic [phone]

## 2017-12-13 NOTE — Telephone Encounter (Signed)
Request for Surgical Clearance  1. What type of surgery is being performed?  Fistulogram with sedation.    2. When is this surgery scheduled? 12/25/2017  3. What type of clearance is required (medical clearance vs. Pharmacy clearance to hold med vs. Both)? PHARMACY ONLY FOR MEDICATION HOLD.     4. Are there any medications that need to be held prior to surgery and how long?  COUMADIN FOR 5 DAY PRE-PROCEDURE    5. Practice name and name of physician performing surgery?   [facility] DR. Eitzen    6.  What is your office phone number? [phone]    7. What is your office fax number? (Be sure to include anyone who it needs to go Attn to) [place]    8. Anesthesia type (None, local, MAC, general)? IV SEDATION   PATIENT HAS APPOINTMENT AT COUMADIN CLINIC 12-13-2017   REMINDER TO USER: Remember to please route this message to P CV DIV PREOP in a phone note.

## 2017-12-14 DIAGNOSIS — Z4901 Encounter for fitting and adjustment of extracorporeal dialysis catheter: Secondary | ICD-10-CM | POA: Diagnosis not present

## 2017-12-14 DIAGNOSIS — N2581 Secondary hyperparathyroidism of renal origin: Secondary | ICD-10-CM | POA: Diagnosis not present

## 2017-12-14 DIAGNOSIS — N186 End stage renal disease: Secondary | ICD-10-CM | POA: Diagnosis not present

## 2017-12-14 DIAGNOSIS — D689 Coagulation defect, unspecified: Secondary | ICD-10-CM | POA: Diagnosis not present

## 2017-12-15 ENCOUNTER — Telehealth: Payer: Self-pay | Admitting: *Deleted

## 2017-12-15 NOTE — Telephone Encounter (Signed)
Pts sister said she briefly spoke with Dr. Andria Frames about this at pts last appt.   She feels that pt and significant other are unable to care for her at home anymore. She would like to discuss placing her sister in a care facility with Dr. Andria Frames.   , Salome Spotted, CMA

## 2017-12-15 NOTE — Telephone Encounter (Signed)
Called and LM that I would call again on Monday.

## 2017-12-15 NOTE — Telephone Encounter (Signed)
   Primary Cardiologist: Larae Grooms, MD  Chart reviewed as part of pre-operative protocol coverage. Patient was contacted 12/15/2017 in reference to pre-operative risk assessment for pending surgery as outlined below.  Christine Santos was last seen on 08/30/2017 by Dr. Irish Lack.  Since that day, Christine Santos has been stable. She notes occ fluttering from afib but it does not last long. No new cardiac symptoms.  Therefore, based on ACC/AHA guidelines, the patient would be at acceptable risk for the planned procedure without further cardiovascular testing.   Anticoagulation: Patient with diagnosis of afib on warfarin for anticoagulation.   CHADS2-VASc score of  7 (CHF, HTN, AGE, DM2, stroke/tia x 2, CAD, AGE, female)  Pending dialysis. Per office protocol, patient can hold warfarin for 5 days prior to procedure with bridging with Lovenox (enoxaparin) around procedure. She will be seen on Monday 11/25 in the coumadin clinic to arrange lovenox.   I will route this recommendation to the requesting party via Epic fax function and remove from pre-op pool.  Please call with questions.  Daune Perch, NP 12/15/2017, 12:31 PM

## 2017-12-16 DIAGNOSIS — Z4901 Encounter for fitting and adjustment of extracorporeal dialysis catheter: Secondary | ICD-10-CM | POA: Diagnosis not present

## 2017-12-16 DIAGNOSIS — N2581 Secondary hyperparathyroidism of renal origin: Secondary | ICD-10-CM | POA: Diagnosis not present

## 2017-12-16 DIAGNOSIS — D689 Coagulation defect, unspecified: Secondary | ICD-10-CM | POA: Diagnosis not present

## 2017-12-16 DIAGNOSIS — N186 End stage renal disease: Secondary | ICD-10-CM | POA: Diagnosis not present

## 2017-12-18 ENCOUNTER — Ambulatory Visit (INDEPENDENT_AMBULATORY_CARE_PROVIDER_SITE_OTHER): Payer: Medicare Other

## 2017-12-18 DIAGNOSIS — I480 Paroxysmal atrial fibrillation: Secondary | ICD-10-CM | POA: Diagnosis not present

## 2017-12-18 DIAGNOSIS — Z4901 Encounter for fitting and adjustment of extracorporeal dialysis catheter: Secondary | ICD-10-CM | POA: Diagnosis not present

## 2017-12-18 DIAGNOSIS — D689 Coagulation defect, unspecified: Secondary | ICD-10-CM | POA: Diagnosis not present

## 2017-12-18 DIAGNOSIS — N2581 Secondary hyperparathyroidism of renal origin: Secondary | ICD-10-CM | POA: Diagnosis not present

## 2017-12-18 DIAGNOSIS — Z7901 Long term (current) use of anticoagulants: Secondary | ICD-10-CM | POA: Diagnosis not present

## 2017-12-18 DIAGNOSIS — N186 End stage renal disease: Secondary | ICD-10-CM | POA: Diagnosis not present

## 2017-12-18 LAB — POCT INR: INR: 3.7 — AB (ref 2.0–3.0)

## 2017-12-18 MED ORDER — ENOXAPARIN SODIUM 60 MG/0.6ML ~~LOC~~ SOLN: 60.0000 mg | SUBCUTANEOUS | 1 refills | Status: DC

## 2017-12-18 NOTE — Telephone Encounter (Signed)
LM that social work would call to discuss approach and that she should not expect a call until tomorrow (Tues 11/26)

## 2017-12-18 NOTE — Patient Instructions (Addendum)
12/18/17: No Coumadin today 12/19/17: Take 1/2 tablet of Coumadin. 12/20/17: Hold Coumadin. Start Lovenox 60 mg subcutaneously once daily at 8 PM. 12/21/17: Hold Coumadin. Give Lovenox 60 mg subcutaneously once daily at 8 PM 12/22/17: Hold Coumadin. Give Lovenox 60 mg subcutaneously once daily at 8 PM 12/23/17: Hold Coumadin. Give Lovenox 60 mg subcutaneously once daily at 8 PM 12/24/17: Hold Coumadin and Lovenox 12/25/17: Fistulogram procedure. No Coumadin or Lovenox before your procedure. Restart 1 tablet of Coumadin in evening after procedure 12/26/17: Restart Lovenox 60 mg subcutaneously once daily at 8 AM. Take your normal Coumadin dose - 1/2 tablet. 12/27/17: Restart Lovenox 60 mg subcutaneously once daily at 8 AM. Take your normal Coumadin dose - 1 tablet. 12/28/17: Restart Lovenox 60 mg subcutaneously once daily at 8 AM. Take your normal Coumadin dose - 1/2 tablet. 12/29/17: Recheck INR in Clinic

## 2017-12-19 ENCOUNTER — Telehealth: Payer: Self-pay | Admitting: Licensed Clinical Social Worker

## 2017-12-19 NOTE — Progress Notes (Signed)
  Social work consult from Dr. Andria Frames to provide information to patient's sister about facility placement process.  Spoke with patient's sister [name]. Assessed concerns, provided general information about the facility placement process and payment options.  Per sister, patient's live in female friend has POA and she is second if he is unable to make decisions.  Sister is concerned that friend is unable to meet patient's needs, properly care for her, and make decisions that are in her best interest. LCSW discussed Adult Protective Services and APS Case management services.  Update provided to PCP via in-basket.  Plan: 1. Sister will contact APS to file a report about there concerns and discuss case management services 2. Sister will talk to patient and take her to apply for Medicaid for facility placement. 3. LCSW will meet with patient and sister if needed during next appointment with PCP.  Casimer Lanius, [place]   [phone] 10:46 AM

## 2017-12-20 ENCOUNTER — Encounter (HOSPITAL_COMMUNITY): Payer: Self-pay | Admitting: *Deleted

## 2017-12-20 ENCOUNTER — Other Ambulatory Visit: Payer: Self-pay

## 2017-12-20 ENCOUNTER — Emergency Department (HOSPITAL_COMMUNITY)
Admission: EM | Admit: 2017-12-20 | Discharge: 2017-12-20 | Disposition: A | Discharge status: Home / Self Care | Payer: Medicare Other | Attending: Emergency Medicine | Admitting: Emergency Medicine

## 2017-12-20 DIAGNOSIS — I132 Hypertensive heart and chronic kidney disease with heart failure and with stage 5 chronic kidney disease, or end stage renal disease: Secondary | ICD-10-CM | POA: Insufficient documentation

## 2017-12-20 DIAGNOSIS — Z4901 Encounter for fitting and adjustment of extracorporeal dialysis catheter: Secondary | ICD-10-CM | POA: Diagnosis not present

## 2017-12-20 DIAGNOSIS — Z951 Presence of aortocoronary bypass graft: Secondary | ICD-10-CM | POA: Insufficient documentation

## 2017-12-20 DIAGNOSIS — D631 Anemia in chronic kidney disease: Secondary | ICD-10-CM | POA: Diagnosis not present

## 2017-12-20 DIAGNOSIS — E1149 Type 2 diabetes mellitus with other diabetic neurological complication: Secondary | ICD-10-CM | POA: Insufficient documentation

## 2017-12-20 DIAGNOSIS — I2510 Atherosclerotic heart disease of native coronary artery without angina pectoris: Secondary | ICD-10-CM | POA: Insufficient documentation

## 2017-12-20 DIAGNOSIS — Z794 Long term (current) use of insulin: Secondary | ICD-10-CM | POA: Diagnosis not present

## 2017-12-20 DIAGNOSIS — E1140 Type 2 diabetes mellitus with diabetic neuropathy, unspecified: Secondary | ICD-10-CM | POA: Diagnosis not present

## 2017-12-20 DIAGNOSIS — I9589 Other hypotension: Secondary | ICD-10-CM | POA: Diagnosis not present

## 2017-12-20 DIAGNOSIS — Z7901 Long term (current) use of anticoagulants: Secondary | ICD-10-CM | POA: Diagnosis not present

## 2017-12-20 DIAGNOSIS — N2581 Secondary hyperparathyroidism of renal origin: Secondary | ICD-10-CM | POA: Diagnosis not present

## 2017-12-20 DIAGNOSIS — R0602 Shortness of breath: Secondary | ICD-10-CM | POA: Diagnosis not present

## 2017-12-20 DIAGNOSIS — R55 Syncope and collapse: Secondary | ICD-10-CM | POA: Diagnosis not present

## 2017-12-20 DIAGNOSIS — E1121 Type 2 diabetes mellitus with diabetic nephropathy: Secondary | ICD-10-CM | POA: Insufficient documentation

## 2017-12-20 DIAGNOSIS — Z992 Dependence on renal dialysis: Secondary | ICD-10-CM | POA: Diagnosis not present

## 2017-12-20 DIAGNOSIS — I5022 Chronic systolic (congestive) heart failure: Secondary | ICD-10-CM | POA: Insufficient documentation

## 2017-12-20 DIAGNOSIS — R0902 Hypoxemia: Secondary | ICD-10-CM | POA: Diagnosis not present

## 2017-12-20 DIAGNOSIS — E861 Hypovolemia: Secondary | ICD-10-CM | POA: Diagnosis not present

## 2017-12-20 DIAGNOSIS — E1122 Type 2 diabetes mellitus with diabetic chronic kidney disease: Secondary | ICD-10-CM | POA: Insufficient documentation

## 2017-12-20 DIAGNOSIS — I959 Hypotension, unspecified: Secondary | ICD-10-CM | POA: Diagnosis not present

## 2017-12-20 DIAGNOSIS — D509 Iron deficiency anemia, unspecified: Secondary | ICD-10-CM | POA: Diagnosis not present

## 2017-12-20 DIAGNOSIS — N186 End stage renal disease: Secondary | ICD-10-CM

## 2017-12-20 DIAGNOSIS — R42 Dizziness and giddiness: Secondary | ICD-10-CM | POA: Diagnosis not present

## 2017-12-20 DIAGNOSIS — I482 Chronic atrial fibrillation, unspecified: Secondary | ICD-10-CM | POA: Diagnosis not present

## 2017-12-20 DIAGNOSIS — I120 Hypertensive chronic kidney disease with stage 5 chronic kidney disease or end stage renal disease: Secondary | ICD-10-CM | POA: Diagnosis not present

## 2017-12-20 DIAGNOSIS — Z23 Encounter for immunization: Secondary | ICD-10-CM | POA: Diagnosis not present

## 2017-12-20 DIAGNOSIS — D689 Coagulation defect, unspecified: Secondary | ICD-10-CM | POA: Diagnosis not present

## 2017-12-20 LAB — CBC
HCT: 34.6 % — ABNORMAL LOW (ref 36.0–46.0)
Hemoglobin: 10.1 g/dL — ABNORMAL LOW (ref 12.0–15.0)
MCH: 29.8 pg (ref 26.0–34.0)
MCHC: 29.2 g/dL — ABNORMAL LOW (ref 30.0–36.0)
MCV: 102.1 fL — AB (ref 80.0–100.0)
PLATELETS: 175 10*3/uL (ref 150–400)
RBC: 3.39 MIL/uL — ABNORMAL LOW (ref 3.87–5.11)
RDW: 18.6 % — ABNORMAL HIGH (ref 11.5–15.5)
WBC: 12.2 10*3/uL — ABNORMAL HIGH (ref 4.0–10.5)
nRBC: 0 % (ref 0.0–0.2)

## 2017-12-20 LAB — COMPREHENSIVE METABOLIC PANEL
ALT: 54 U/L — AB (ref 0–44)
AST: 47 U/L — ABNORMAL HIGH (ref 15–41)
Albumin: 2.4 g/dL — ABNORMAL LOW (ref 3.5–5.0)
Alkaline Phosphatase: 195 U/L — ABNORMAL HIGH (ref 38–126)
Anion gap: 10 (ref 5–15)
BILIRUBIN TOTAL: 0.6 mg/dL (ref 0.3–1.2)
BUN: 17 mg/dL (ref 8–23)
CHLORIDE: 100 mmol/L (ref 98–111)
CO2: 27 mmol/L (ref 22–32)
CREATININE: 2.11 mg/dL — AB (ref 0.44–1.00)
Calcium: 7.5 mg/dL — ABNORMAL LOW (ref 8.9–10.3)
GFR calc non Af Amer: 23 mL/min — ABNORMAL LOW (ref >60)
GFR, EST AFRICAN AMERICAN: 26 mL/min — AB (ref >60)
Glucose, Bld: 238 mg/dL — ABNORMAL HIGH (ref 70–99)
Potassium: 4 mmol/L (ref 3.5–5.1)
Sodium: 137 mmol/L (ref 135–145)
TOTAL PROTEIN: 4.7 g/dL — AB (ref 6.5–8.1)

## 2017-12-20 LAB — PROTIME-INR
INR: 2.21
PROTHROMBIN TIME: 24.3 s — AB (ref 11.4–15.2)

## 2017-12-20 NOTE — ED Notes (Signed)
Provided patient with snack and water - tolerating well at this time.

## 2017-12-20 NOTE — Discharge Instructions (Signed)
It was our pleasure to provide your ER care today - we hope that you feel better.  Rest. Drink adequate fluids.  Return to ER if worse, new symptoms, fevers, trouble breathing, weak/fainting, other concern.

## 2017-12-20 NOTE — Progress Notes (Signed)
LCSW received call from patient's sister [name].  She has contacted the Department of Social Services who will assist her with facility placement. DSS Worker is mailing all paper work to [employer].  Sister and patient will go to DSS next week for a meeting.    Sister is requesting an FL2 from PCP Dec 12th when she comes in for her appointment, however will contact LCSW if she needs it sooner.  Update shared with PCP via in-basket message.  Casimer Lanius, [place]   [phone] 11:40 AM

## 2017-12-20 NOTE — ED Provider Notes (Signed)
[facility] EMERGENCY DEPARTMENT Provider Note   CSN: [id number] Arrival date & time: 12/20/17  1723     History   Chief Complaint Chief Complaint  Patient presents with  . Hypotension    HPI PERLINE AWE is a 72 y.o. female.  Patient w hx esrd/hd, states in the last 15 minutes of her dialysis today, her blood pressure got low and she transiently/momentarily fainted. Pt indicates he bil feet/ankles had been swollen lately, and that they tried to take off a bit more fluid. EMS report low bp at dialysis center, that improved w 300 cc bolus. Pt states felt fine/normal earlier today. No headaches. No chest pain or discomfort. No sob. No abd pain. No nvd. No rectal bleeding or melena. Denies change in meds. No fever or chills. Had eaten breakfast but not since then. Currently feels improved, no faintness or dizziness.   The history is provided by the patient and the EMS personnel.    Past Medical History:  Diagnosis Date  . Anemia 08/29/2011  . Anxiety   . Arthritis    "legs" (01/22/2013)  . CAD (coronary artery disease)    s/p CABG in 1999  . Cataract   . Chronic kidney disease    Sees Dr [name]  . CLL (chronic lymphoblastic leukemia)    Kidney  . Diastolic heart failure ([place])   . GERD (gastroesophageal reflux disease)   . Gout   . H/O hiatal hernia   . Heart murmur   . Hyperlipidemia   . Hypertension   . IDDM (insulin dependent diabetes mellitus) ([place])    Type II  . PAD (peripheral artery disease) ([place])   . Paroxysmal atrial fibrillation (HCC)    a. identified on ILR as part of StrokeAF study  . Peripheral vascular disease ([place])   . Pneumonia 11/2016  . 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 Still has balance issues     Patient Active Problem List   Diagnosis Date Noted  . Pelvic pain   . Intractable vomiting   . Bacteremia   . Urinary tract infection without hematuria   . Chest pain   . Diarrhea   .  ESRD on dialysis ([place])   . Edema   . Abdominal pain 10/27/2017  . Ovarian cyst rupture   . Long term (current) use of anticoagulants 10/02/2017  . Enteritis due to Clostridium difficile 07/02/2017  . Left hip pain 02/16/2017  . History of stroke 01/20/2017  . Physical deconditioning 12/30/2016  . Transaminitis 12/23/2016  . Pleural effusion   . Hypoxia 11/25/2016  . Paroxysmal atrial fibrillation ([place]) 10/22/2015  . Sinusitis, chronic 08/05/2015  . 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 Hyperkalemia 06/21/2013  . Diabetic nephropathy ([place]) 05/23/2013  . Diabetic neuropathy ([place]) 11/30/2012  . Macular degeneration 07/04/2012  . Diabetic retinopathy ([place]) 03/20/2012  . Osteopenia 09/22/2011  . Anemia in chronic kidney disease 08/29/2011  . Gout 04/13/2011  . Stress incontinence, female 11/11/2010  . Anxiety 08/18/2010  . Chronic systolic heart failure ([place]) 02/18/2009  . SINUS BRADYCARDIA 10/29/2008  . Peripheral vascular disease ([place]) 06/18/2008  . CKD (chronic kidney disease) stage 5, GFR less than 15 ml/min (HCC) 07/13/2007  . Chronic lymphocytic leukemia ([place]) 10/13/2006  . 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 HYPERCHOLESTEROLEMIA 03/23/2006  . Essential hypertension 03/23/2006  . Coronary atherosclerosis 03/23/2006  . REFLUX ESOPHAGITIS 03/23/2006  .  IRRITABLE BOWEL SYNDROME 03/23/2006  . CYSTOCELE/RECTOCELE/PROLAPSE,UNSPEC. 03/23/2006  . Osteoarthritis, multiple sites 03/23/2006    Past Surgical History:  Procedure Laterality Date  . ANGIOPLASTY / STENTING FEMORAL Right 09/2010   SFA/notes 11/25/2010 (01/22/2013)  . ANGIOPLASTY / STENTING FEMORAL Left 11/2010   SFA/notes 11/25/2010 (01/22/2013)  . ANGIOPLASTY / STENTING ILIAC     [name] 11/25/2010 (01/22/2013)  . BASCILIC VEIN TRANSPOSITION Left 08/23/2017   Procedure: FIRST STAGE BASILIC VEIN TRANSPOSITION LEFT UPPER EXTREMITY;  Surgeon: [name], MD;  Location: [facility];  Service: Vascular;  Laterality: Left;  . BASCILIC VEIN TRANSPOSITION Left 10/23/2017   Procedure: SECOND STAGE BASILIC VEIN TRANSPOSITION LEFT ARM;  Surgeon: [name], MD;  Location: [facility];  Service: Vascular;  Laterality: Left;  . CATARACT EXTRACTION W/ INTRAOCULAR LENS  IMPLANT, BILATERAL Bilateral ?2011  . CHOLECYSTECTOMY  1993  . CORONARY ARTERY BYPASS GRAFT  10/1994   "CABG X3"  . EP IMPLANTABLE DEVICE N/A 09/11/2015   Procedure: Loop Recorder Insertion;  Surgeon: Thompson Grayer, MD;  Location: [facility] CV [facility];  Service: Cardiovascular;  Laterality: N/A;  . HEEL SPUR EXCISION Bilateral 1970's  . IR FLUORO GUIDE CV LINE RIGHT  11/01/2017  . IR FLUORO GUIDE CV LINE RIGHT  11/05/2017  . IR US GUIDE VASC ACCESS RIGHT  11/01/2017  . IR US GUIDE VASC ACCESS RIGHT  11/05/2017  . LOWER EXTREMITY ANGIOGRAM N/A 01/22/2013   Procedure: LOWER EXTREMITY ANGIOGRAM;  Surgeon: [name], MD;  Location: [facility] CATH [facility];  Service: Cardiovascular;  Laterality: N/A;  . SHOULDER OPEN ROTATOR CUFF REPAIR Bilateral 1990's   "2 times on 1 side; once on the other"  . TRANSLUMINAL ATHERECTOMY FEMORAL ARTERY Right 01/22/2013   & balloon  . TUBAL LIGATION  1980's  . VIDEO ASSISTED THORACOSCOPY (VATS)/EMPYEMA Left 11/29/2016   Procedure: VIDEO ASSISTED THORACOSCOPY (VATS)/EMPYEMA;  Surgeon: [name], MD;  Location: [facility];  Service: Thoracic;  Laterality: Left;  VIDEO ASSISTED THORACOSCOPY (VATS)/EMPYEMA     OB History   None      Home Medications    Prior to Admission medications   Medication Sig Start Date End Date Taking? Authorizing Provider  ACCU-CHEK AVIVA PLUS test strip TEST THREE TIMES A DAY 04/26/17   [name], MD  allopurinol (ZYLOPRIM) 100 MG tablet Take 0.5 tablets (50 mg total) by mouth daily. 11/06/17   Anderson, Chelsey L, DO  Ascorbic Acid (VITAMIN C) 1000 MG tablet Take 1,000 mg by mouth daily.    [provider]    carboxymethylcellulose (REFRESH PLUS) 0.5 % SOLN Place 1 drop into both eyes 3 (three) times daily.     [provider]  doxycycline (VIBRA-TABS) 100 MG tablet Take 1 tablet (100 mg total) by mouth 2 (two) times daily. 12/11/17   Martyn Malay, MD  enoxaparin (LOVENOX) 60 MG/0.6ML injection Inject 0.6 mLs (60 mg total) into the skin daily. 12/18/17   [name], MD  ferrous sulfate 325 (65 FE) MG tablet Take 1 tablet (325 mg total) by mouth daily with breakfast. 05/23/13   [name], MD  GNP GARLIC EXTRACT PO Take 1 tablet by mouth daily.     [provider]  HUMALOG KWIKPEN 100 UNIT/ML KiwkPen Inject 5 Units as directed 3 (three) times daily after meals.  11/03/16   [provider]  hydrALAZINE (APRESOLINE) 50 MG tablet TAKE 1 TABLET BY MOUTH THREE TIMES DAILY Patient not taking: No sig reported 10/18/17  [name], MD  Insulin Glargine (LANTUS SOLOSTAR) 100 UNIT/ML Solostar Pen Inject 10 Units into the skin daily. 11/29/17   [name], MD  Melatonin 10 MG CAPS Take 10 mg by mouth at bedtime.     [provider]  metoprolol tartrate (LOPRESSOR) 25 MG tablet Take 1 tablet (25 mg total) by mouth 2 (two) times daily. Patient taking differently: Take 12.5 mg by mouth 2 (two) times daily.  11/05/17   Anderson, Chelsey L, DO  Multiple Vitamins-Minerals (EYE VITAMINS PO) Take 1 capsule by mouth daily. Taking one daily from her opthamologist (Dr. Herbert Deaner)     [provider]  Multiple Vitamins-Minerals (MULTIVITAMINS THER. W/MINERALS) TABS Take 1 tablet by mouth daily.      [provider]  omega-3 acid ethyl esters (LOVAZA) 1 g capsule Take 2 g by mouth 2 (two) times daily.     [provider]  ondansetron (ZOFRAN) 4 MG tablet Take 1 tablet (4 mg total) by mouth every 8 (eight) hours as needed for nausea or vomiting. Patient not taking: Reported on 12/15/2017 10/11/17   Carlyle Basques, MD  pantoprazole  (PROTONIX) 40 MG tablet TAKE 1 TABLET BY MOUTH DAILY Patient taking differently: Take 40 mg by mouth at bedtime.  05/29/17   [name], MD  PARoxetine (PAXIL) 40 MG tablet Take 1 tablet (40 mg total) by mouth every morning. 10/18/17   [name], MD  rosuvastatin (CRESTOR) 10 MG tablet Take 1 tablet (10 mg total) by mouth at bedtime. Patient not taking: Reported on 12/15/2017 11/05/17   Doristine Mango L, DO  rosuvastatin (CRESTOR) 20 MG tablet Take 20 mg by mouth at bedtime.    [provider]  traMADol (ULTRAM) 50 MG tablet Take 1 tablet (50 mg total) by mouth every 12 (twelve) hours as needed (for pain.). Patient taking differently: Take 50 mg by mouth 4 (four) times daily as needed (for pain.).  11/05/17   Anderson, Chelsey L, DO  vancomycin (VANCOCIN) 250 MG capsule Take 1 capsule (250 mg total) by mouth 4 (four) times daily. Patient not taking: Reported on 12/15/2017 11/22/17   [name], MD  warfarin (COUMADIN) 5 MG tablet Take 1/2 to 1 tablet daily as directed by Coumadin clinic Patient taking differently: Take 2.5-5 mg by mouth See admin instructions. Take 1/2 to 1 tablet daily as directed by Coumadin clinic 11/15/17   [name], MD    Family History Family History  Problem Relation Age of Onset  . Heart disease Mother   . Heart attack Mother   . Hypertension Mother   . Kidney disease Mother   . Pneumonia Father   . Heart attack Father   . Hyperlipidemia Father   . Diabetes Sister   . Breast cancer Sister   . Arthritis Brother   . Diabetes Brother   . Heart attack Sister   . Obesity Sister   . Hypertension Sister   . Heart attack Sister   . Cancer Sister        breast  . Hyperlipidemia Sister   . Hypertension Sister   . Hyperlipidemia Sister   . Breast cancer Maternal Grandmother     Social History Social History   Tobacco Use  . Smoking status: Never Smoker  . Smokeless tobacco: Never Used  Substance Use Topics  .  Alcohol use: No  . Drug use: No     Allergies   Cefepime; Other; Amoxicillin; Codeine phosphate; and Penicillins  Review of Systems Review of Systems  Constitutional: Negative for fever.  HENT: Negative for sore throat.   Eyes: Negative for visual disturbance.  Respiratory: Negative for cough and shortness of breath.   Cardiovascular: Negative for chest pain and palpitations.  Gastrointestinal: Negative for abdominal pain, blood in stool and vomiting.  Genitourinary: Negative for dysuria and flank pain.  Musculoskeletal: Negative for back pain and neck pain.  Skin: Negative for rash.  Neurological: Negative for headaches.  Hematological: Does not bruise/bleed easily.  Psychiatric/Behavioral: Negative for confusion.     Physical Exam Updated Vital Signs Pulse 92   Resp 16   Ht 1.664 m (5' 5.5")   Wt 64.9 kg   BMI 23.43 kg/m   Physical Exam  Constitutional: She appears well-developed and well-nourished.  HENT:  Head: Atraumatic.  Mouth/Throat: Oropharynx is clear and moist.  Eyes: Pupils are equal, round, and reactive to light. Conjunctivae are normal. No scleral icterus.  Neck: Neck supple. No tracheal deviation present.  Cardiovascular: Regular rhythm, normal heart sounds and intact distal pulses. Exam reveals no gallop and no friction rub.  No murmur heard. Pulmonary/Chest: Effort normal and breath sounds normal. No respiratory distress.  Abdominal: Soft. Normal appearance and bowel sounds are normal. She exhibits no distension and no mass. There is no tenderness. There is no guarding.  No pulsatile mass.   Genitourinary:  Genitourinary Comments: No cva tenderness.   Musculoskeletal:  Symmetric bilateral ankle and lower leg edema.   Neurological: She is alert.  Speech clear/fluent. Motor/sens grossly intact bil.   Skin: Skin is warm and dry. No rash noted.  Psychiatric: She has a normal mood and affect.  Nursing note and vitals reviewed.    ED Treatments /  Results  Labs (all labs ordered are listed, but only abnormal results are displayed) Results for orders placed or performed during the hospital encounter of 12/20/17  CBC  Result Value Ref Range   WBC 12.2 (H) 4.0 - 10.5 K/uL   RBC 3.39 (L) 3.87 - 5.11 MIL/uL   Hemoglobin 10.1 (L) 12.0 - 15.0 g/dL   HCT 34.6 (L) 36.0 - 46.0 %   MCV 102.1 (H) 80.0 - 100.0 fL   MCH 29.8 26.0 - 34.0 pg   MCHC 29.2 (L) 30.0 - 36.0 g/dL   RDW 18.6 (H) 11.5 - 15.5 %   Platelets 175 150 - 400 K/uL   nRBC 0.0 0.0 - 0.2 %  Comprehensive metabolic panel  Result Value Ref Range   Sodium 137 135 - 145 mmol/L   Potassium 4.0 3.5 - 5.1 mmol/L   Chloride 100 98 - 111 mmol/L   CO2 27 22 - 32 mmol/L   Glucose, Bld 238 (H) 70 - 99 mg/dL   BUN 17 8 - 23 mg/dL   Creatinine, Ser 2.11 (H) 0.44 - 1.00 mg/dL   Calcium 7.5 (L) 8.9 - 10.3 mg/dL   Total Protein 4.7 (L) 6.5 - 8.1 g/dL   Albumin 2.4 (L) 3.5 - 5.0 g/dL   AST 47 (H) 15 - 41 U/L   ALT 54 (H) 0 - 44 U/L   Alkaline Phosphatase 195 (H) 38 - 126 U/L   Total Bilirubin 0.6 0.3 - 1.2 mg/dL   GFR calc non Af Amer 23 (L) >60 mL/min   GFR calc Af Amer 26 (L) >60 mL/min   Anion gap 10 5 - 15  Protime-INR  Result Value Ref Range   Prothrombin Time 24.3 (H) 11.4 - 15.2 seconds  INR 2.21      EKG EKG Interpretation  Date/Time:  Wednesday December 20 2017 17:30:58 EST Ventricular Rate:  94 PR Interval:    QRS Duration: 96 QT Interval:  410 QTC Calculation: 513 R Axis:   -67 Text Interpretation:  Atrial fibrillation Ventricular premature complex Left anterior fascicular block Prolonged QT interval Non-specific ST-t changes No significant change since last tracing Confirmed by Lajean Saver [phone]) on 12/20/2017 5:54:48 PM   Radiology No results found.  Procedures Procedures (including critical care time)  Medications Ordered in ED Medications - No data to display   Initial Impression / Assessment and Plan / ED Course  I have reviewed the triage  vital signs and the nursing notes.  Pertinent labs & imaging results that were available during my care of the patient were reviewed by me and considered in my medical decision making (see chart for details).  BP currently improved/normal.  Labs sent.  Reviewed nursing notes and prior charts for additional history.   Po fluids, and food given.   Labs reviewed - appear c/w baseline, k normal.  Pt has eaten/drank. Pt denies pain or other c/o. bp remains normal.  Pt currently appears stable for d/c.     Final Clinical Impressions(s) / ED Diagnoses   Final diagnoses:  None    ED Discharge Orders    None       Lajean Saver, MD 12/20/17 1905

## 2017-12-20 NOTE — ED Triage Notes (Signed)
Pt here via [facility] after a brief syncopal episode 15 minutes from completing dialysis.  Denies chest pain, diaphoresis, etc.  Was given 300 by dialysis staff and 250 by ems.  cbg 210.  Initial bp was 48/20 by staff and 126/70 after 300 ml.  20G R ac. ao x 4.

## 2017-12-23 DIAGNOSIS — N186 End stage renal disease: Secondary | ICD-10-CM | POA: Diagnosis not present

## 2017-12-23 DIAGNOSIS — E1122 Type 2 diabetes mellitus with diabetic chronic kidney disease: Secondary | ICD-10-CM | POA: Diagnosis not present

## 2017-12-23 DIAGNOSIS — Z992 Dependence on renal dialysis: Secondary | ICD-10-CM | POA: Diagnosis not present

## 2017-12-25 ENCOUNTER — Encounter (HOSPITAL_COMMUNITY)
Admission: RE | Disposition: A | Discharge status: Home / Self Care | Payer: Self-pay | Source: Ambulatory Visit | Attending: Vascular Surgery

## 2017-12-25 ENCOUNTER — Other Ambulatory Visit: Payer: Self-pay

## 2017-12-25 ENCOUNTER — Ambulatory Visit (HOSPITAL_COMMUNITY)
Admission: RE | Admit: 2017-12-25 | Discharge: 2017-12-25 | Disposition: A | Discharge status: Home / Self Care | Payer: Medicare Other | Source: Ambulatory Visit | Attending: Vascular Surgery | Admitting: Vascular Surgery

## 2017-12-25 ENCOUNTER — Encounter (HOSPITAL_COMMUNITY): Payer: Self-pay | Admitting: Vascular Surgery

## 2017-12-25 ENCOUNTER — Telehealth: Payer: Self-pay

## 2017-12-25 DIAGNOSIS — Z885 Allergy status to narcotic agent status: Secondary | ICD-10-CM | POA: Diagnosis not present

## 2017-12-25 DIAGNOSIS — Z992 Dependence on renal dialysis: Secondary | ICD-10-CM | POA: Diagnosis not present

## 2017-12-25 DIAGNOSIS — Z880 Allergy status to penicillin: Secondary | ICD-10-CM | POA: Insufficient documentation

## 2017-12-25 DIAGNOSIS — Y832 Surgical operation with anastomosis, bypass or graft as the cause of abnormal reaction of the patient, or of later complication, without mention of misadventure at the time of the procedure: Secondary | ICD-10-CM | POA: Insufficient documentation

## 2017-12-25 DIAGNOSIS — Z79899 Other long term (current) drug therapy: Secondary | ICD-10-CM | POA: Diagnosis not present

## 2017-12-25 DIAGNOSIS — N186 End stage renal disease: Secondary | ICD-10-CM | POA: Insufficient documentation

## 2017-12-25 DIAGNOSIS — Z794 Long term (current) use of insulin: Secondary | ICD-10-CM | POA: Insufficient documentation

## 2017-12-25 DIAGNOSIS — T82858A Stenosis of vascular prosthetic devices, implants and grafts, initial encounter: Secondary | ICD-10-CM | POA: Insufficient documentation

## 2017-12-25 DIAGNOSIS — T82898A Other specified complication of vascular prosthetic devices, implants and grafts, initial encounter: Secondary | ICD-10-CM | POA: Diagnosis not present

## 2017-12-25 DIAGNOSIS — Z7901 Long term (current) use of anticoagulants: Secondary | ICD-10-CM | POA: Insufficient documentation

## 2017-12-25 HISTORY — PX: A/V FISTULAGRAM: CATH118298

## 2017-12-25 HISTORY — PX: A/V SHUNT INTERVENTION: CATH118220

## 2017-12-25 LAB — POCT I-STAT, CHEM 8
BUN: 40 mg/dL — ABNORMAL HIGH (ref 8–23)
CALCIUM ION: 1.17 mmol/L (ref 1.15–1.40)
Chloride: 101 mmol/L (ref 98–111)
Creatinine, Ser: 4.3 mg/dL — ABNORMAL HIGH (ref 0.44–1.00)
Glucose, Bld: 112 mg/dL — ABNORMAL HIGH (ref 70–99)
HCT: 35 % — ABNORMAL LOW (ref 36.0–46.0)
Hemoglobin: 11.9 g/dL — ABNORMAL LOW (ref 12.0–15.0)
Potassium: 4.2 mmol/L (ref 3.5–5.1)
Sodium: 139 mmol/L (ref 135–145)
TCO2: 30 mmol/L (ref 22–32)

## 2017-12-25 LAB — PROTIME-INR
INR: 1.13
Prothrombin Time: 14.4 seconds (ref 11.4–15.2)

## 2017-12-25 SURGERY — A/V FISTULAGRAM
Anesthesia: LOCAL | Laterality: Left

## 2017-12-25 MED ORDER — OXYCODONE HCL 5 MG PO TABS: 5.0000 mg | ORAL_TABLET | ORAL | Status: DC | PRN

## 2017-12-25 MED ORDER — FENTANYL CITRATE (PF) 100 MCG/2ML IJ SOLN
INTRAMUSCULAR | Status: AC
  Filled 2017-12-25: qty 2

## 2017-12-25 MED ORDER — LABETALOL HCL 5 MG/ML IV SOLN: 10.0000 mg | INTRAVENOUS | Status: DC | PRN

## 2017-12-25 MED ORDER — SODIUM CHLORIDE 0.9 % IV SOLN: 250.0000 mL | INTRAVENOUS | Status: DC | PRN

## 2017-12-25 MED ORDER — VERAPAMIL HCL 2.5 MG/ML IV SOLN
INTRAVENOUS | Status: AC
  Filled 2017-12-25: qty 2

## 2017-12-25 MED ORDER — HEPARIN (PORCINE) IN NACL 1000-0.9 UT/500ML-% IV SOLN
INTRAVENOUS | Status: AC
  Filled 2017-12-25: qty 500

## 2017-12-25 MED ORDER — ACETAMINOPHEN 325 MG PO TABS: 650.0000 mg | ORAL_TABLET | ORAL | Status: DC | PRN

## 2017-12-25 MED ORDER — SODIUM CHLORIDE 0.9% FLUSH: 3.0000 mL | INTRAVENOUS | Status: DC | PRN

## 2017-12-25 MED ORDER — LIDOCAINE HCL (PF) 1 % IJ SOLN
INTRAMUSCULAR | Status: DC | PRN
  Administered 2017-12-25: 2 mL

## 2017-12-25 MED ORDER — HEPARIN SODIUM (PORCINE) 1000 UNIT/ML IJ SOLN
INTRAMUSCULAR | Status: AC
  Filled 2017-12-25: qty 1

## 2017-12-25 MED ORDER — FENTANYL CITRATE (PF) 100 MCG/2ML IJ SOLN
INTRAMUSCULAR | Status: DC | PRN
  Administered 2017-12-25: 50 ug via INTRAVENOUS

## 2017-12-25 MED ORDER — HEPARIN (PORCINE) IN NACL 1000-0.9 UT/500ML-% IV SOLN
INTRAVENOUS | Status: DC | PRN
  Administered 2017-12-25: 500 mL

## 2017-12-25 MED ORDER — HEPARIN SODIUM (PORCINE) 1000 UNIT/ML IJ SOLN
INTRAMUSCULAR | Status: DC | PRN
  Administered 2017-12-25: 3000 [IU] via INTRAVENOUS

## 2017-12-25 MED ORDER — HYDROMORPHONE HCL 1 MG/ML IJ SOLN: 0.5000 mg | INTRAMUSCULAR | Status: DC | PRN

## 2017-12-25 MED ORDER — ONDANSETRON HCL 4 MG/2ML IJ SOLN: 4.0000 mg | Freq: Four times a day (QID) | INTRAMUSCULAR | Status: DC | PRN

## 2017-12-25 MED ORDER — SODIUM CHLORIDE 0.9% FLUSH: 3.0000 mL | Freq: Two times a day (BID) | INTRAVENOUS | Status: DC

## 2017-12-25 MED ORDER — IODIXANOL 320 MG/ML IV SOLN
INTRAVENOUS | Status: DC | PRN
  Administered 2017-12-25: 40 mL via INTRAVENOUS

## 2017-12-25 MED ORDER — LIDOCAINE HCL (PF) 1 % IJ SOLN
INTRAMUSCULAR | Status: AC
  Filled 2017-12-25: qty 30

## 2017-12-25 MED ORDER — HYDRALAZINE HCL 20 MG/ML IJ SOLN: 5.0000 mg | INTRAMUSCULAR | Status: DC | PRN

## 2017-12-25 MED ORDER — VERAPAMIL HCL 2.5 MG/ML IV SOLN
INTRAVENOUS | Status: DC | PRN
  Administered 2017-12-25: 08:00:00 via INTRA_ARTERIAL

## 2017-12-25 SURGICAL SUPPLY — 22 items
BAG SNAP BAND KOVER 36X36 (MISCELLANEOUS) ×2 IMPLANT
BALLN LUTONIX AV 6X80X75 (BALLOONS) ×2
BALLN MUSTANG 4.0X40 75 (BALLOONS) ×2
BALLN MUSTANG 5.0X40 75 (BALLOONS) ×2
BALLOON LUTONIX AV 6X80X75 (BALLOONS) ×1 IMPLANT
BALLOON MUSTANG 4.0X40 75 (BALLOONS) ×1 IMPLANT
BALLOON MUSTANG 5.0X40 75 (BALLOONS) ×1 IMPLANT
CATH ANGIO 5F BER2 65CM (CATHETERS) ×2 IMPLANT
COVER DOME SNAP 22 D (MISCELLANEOUS) ×2 IMPLANT
DEVICE RAD COMP TR BAND LRG (VASCULAR PRODUCTS) ×2 IMPLANT
GLIDESHEATH SLEND A-KIT 6F 22G (SHEATH) ×2 IMPLANT
GUIDEWIRE ANGLED .035X150CM (WIRE) ×2 IMPLANT
KIT ENCORE 26 ADVANTAGE (KITS) ×2 IMPLANT
KIT MICROPUNCTURE NIT STIFF (SHEATH) ×2 IMPLANT
PROTECTION STATION PRESSURIZED (MISCELLANEOUS) ×2
SHEATH PROBE COVER 6X72 (BAG) ×2 IMPLANT
STATION PROTECTION PRESSURIZED (MISCELLANEOUS) ×1 IMPLANT
STOPCOCK MORSE 400PSI 3WAY (MISCELLANEOUS) ×2 IMPLANT
TRAY PV CATH (CUSTOM PROCEDURE TRAY) ×2 IMPLANT
TUBING CIL FLEX 10 FLL-RA (TUBING) ×2 IMPLANT
WIRE BENTSON .035X145CM (WIRE) ×2 IMPLANT
WIRE SPARTACORE .014X190CM (WIRE) ×2 IMPLANT

## 2017-12-25 NOTE — Telephone Encounter (Signed)
Orders given as requested. 

## 2017-12-25 NOTE — H&P (Signed)
   History and Physical Update  The patient was interviewed and re-examined.  The patient's previous History and Physical has been reviewed and is unchanged from recent office visit. Plan for left arm shuntogram from radial approach.  Ilda Laskin C. Donzetta Matters, MD [facility] Office: [phone] Pager: [phone]  12/25/2017, 7:25 AM

## 2017-12-25 NOTE — Op Note (Addendum)
    Patient name: Christine Santos MRN: [id number] DOB: 1945/04/01 Sex: female  12/25/2017 Pre-operative Diagnosis: End-stage renal disease, malfunction left arm AV fistula Post-operative diagnosis:  Same Surgeon:  Eda Paschal. Donzetta Matters, MD Procedure Performed: 1.  Ultrasound-guided cannulation left radial artery 2.  Left upper extremity shuntogram 3.  Drug-coated balloon angioplasty with 6 mm lutonix of the left basilic vein fistula  Indications: 72 year old female has undergone two-stage basilic vein fistula.  It has failed to mature has an area of tight stenosis by duplex.  She is indicated for fistulogram possible intervention.  Findings: Radial artery was suitable for cannulation and ulnar artery had good flow by ultrasound.  The brachial artery was healthy.  The anastomosis was patent with a string for approximately 4 cm in the takeoff.  The rest of the fistula was patent as was the central veins.  At completion there was no further stenosis no dissection in the fistula but there was a small pseudoaneurysm that I tried to exclude with balloon angioplasty but did not.  There was a strong thrill at completion.    Stenosis prior to procedure was 95% and post procedure was 0%  Patient will follow-up with duplex in a few weeks to evaluate fistula at which time she could begin to use it if the flow is adequate.   Procedure:  The patient was identified in the holding area and taken to room 8 where she is placed supine operative table and she sterilely prepped and draped in her left hand and wrist in the usual fashion.  Timeout was called.  We began using ultrasound to evaluate the radial and ulnar arteries which were both appeared patent with good pulsatility.  The area was anesthetized 1% lidocaine.  Left radial artery was cannulated with micro puncture needle followed by wire and sheath.  3000 units of heparin was administered.  We exchanged for 6 French slender sheath.  3 mg of verapamil was given  through the sheath.  We then used Bentson wire and bare catheter to traverse to the brachial artery performed left upper extremity angiogram.  Given the tight stenosis in the proximal fistula we cannulated this with very catheter placed the Glidewire was able to cross with bare catheter performed the rest of the shuntogram the upper extremity with the above findings.  We then exchanged for a 014 wire performed balloon angioplasty first with a 4 followed by 5 mm balloons.  Completion angios demonstrated good dilatation so we used a 6 mm drug-coated balloon to nominal pressure for 2 minutes.  After this Angie demonstrated what appeared to be possible acute pseudoaneurysm although no rupture.  We again inflated the balloon for 2 minutes at very low pressure 3 atm completion angios still demonstrated the pseudo aneurysm.  There is no free rupture.  We performed angiogram of the ulnar and interosseous arteries on the way out as the radial artery was occluded.  We then removed our balloon and wire.  Sheath will be pulled in postoperative holding.  She tolerated procedure well without immediate complication.  Contrast: [place] Donzetta Matters, MD [facility] Office: [phone] Pager: [phone]

## 2017-12-25 NOTE — Discharge Instructions (Signed)
Radial Site Care Refer to this sheet in the next few weeks. These instructions provide you with information about caring for yourself after your procedure. Your health care provider may also give you more specific instructions. Your treatment has been planned according to current medical practices, but problems sometimes occur. Call your health care provider if you have any problems or questions after your procedure. What can I expect after the procedure? After your procedure, it is typical to have the following:  Bruising at the radial site that usually fades within 1-2 weeks.  Blood collecting in the tissue (hematoma) that may be painful to the touch. It should usually decrease in size and tenderness within 1-2 weeks.  Follow these instructions at home:  Take medicines only as directed by your health care provider.  You may shower 24-48 hours after the procedure or as directed by your health care provider. Remove the bandage (dressing) and gently wash the site with plain soap and water. Pat the area dry with a clean towel. Do not rub the site, because this may cause bleeding.  Do not take baths, swim, or use a hot tub until your health care provider approves.  Check your insertion site every day for redness, swelling, or drainage.  Do not apply powder or lotion to the site.  Do not flex or bend the affected arm for 24 hours or as directed by your health care provider.  Do not push or pull heavy objects with the affected arm for 24 hours or as directed by your health care provider.  Do not lift over 10 lb (4.5 kg) for 5 days after your procedure or as directed by your health care provider.  Ask your health care provider when it is okay to: ? Return to work or school. ? Resume usual physical activities or sports. ? Resume sexual activity.  Do not drive home if you are discharged the same day as the procedure. Have someone else drive you.  You may drive 24 hours after the procedure  unless otherwise instructed by your health care provider.  Do not operate machinery or power tools for 24 hours after the procedure.  If your procedure was done as an outpatient procedure, which means that you went home the same day as your procedure, a responsible adult should be with you for the first 24 hours after you arrive home.  Keep all follow-up visits as directed by your health care provider. This is important. Contact a health care provider if:  You have a fever.  You have chills.  You have increased bleeding from the radial site. Hold pressure on the site. Get help right away if:  You have unusual pain at the radial site.  You have redness, warmth, or swelling at the radial site.  You have drainage (other than a small amount of blood on the dressing) from the radial site.  The radial site is bleeding, and the bleeding does not stop after 30 minutes of holding steady pressure on the site.  Your arm or hand becomes pale, cool, tingly, or numb. This information is not intended to replace advice given to you by your health care provider. Make sure you discuss any questions you have with your health care provider. Document Released: 02/12/2010 Document Revised: 06/18/2015 Document Reviewed: 07/29/2013 Elsevier Interactive Patient Education  2018 [place] After Refer to this sheet in the next few weeks. These instructions provide you with information on caring for yourself after your procedure.  Your health care provider may also give you more specific instructions. Your treatment has been planned according to current medical practices, but problems sometimes occur. Call your health care provider if you have any problems or questions after your procedure. What can I expect after the procedure? After your procedure, it is typical to have the following:  A small amount of discomfort in the area where the catheters were placed.  A small amount of  bruising around the fistula.  Sleepiness and fatigue.  Follow these instructions at home:  Rest at home for the day following your procedure.  Do not drive or operate heavy machinery while taking pain medicine.  Take medicines only as directed by your health care provider.  Do not take baths, swim, or use a hot tub until your health care provider approves. You may shower 24 hours after the procedure or as directed by your health care provider.  There are many different ways to close and cover an incision, including stitches, skin glue, and adhesive strips. Follow your health care provider's instructions on: ? Incision care. ? Bandage (dressing) changes and removal. ? Incision closure removal.  Monitor your dialysis fistula carefully. Contact a health care provider if:  You have drainage, redness, swelling, or pain at your catheter site.  You have a fever.  You have chills. Get help right away if:  You feel weak.  You have trouble balancing.  You have trouble moving your arms or legs.  You have problems with your speech or vision.  You can no longer feel a vibration or buzz when you put your fingers over your dialysis fistula.  The limb that was used for the procedure: ? Swells. ? Is painful. ? Is cold. ? Is discolored, such as blue or pale white. This information is not intended to replace advice given to you by your health care provider. Make sure you discuss any questions you have with your health care provider. Document Released: 05/27/2013 Document Revised: 06/18/2015 Document Reviewed: 03/01/2013 Elsevier Interactive Patient Education  2018 [employer].

## 2017-12-25 NOTE — Telephone Encounter (Signed)
Henderson Newcomer, OT called nurse line requesting VO for patient.   1x for week 1 2x for week 2  A VM can be left on her secure line [phone]

## 2017-12-26 ENCOUNTER — Encounter (HOSPITAL_COMMUNITY): Payer: Self-pay | Admitting: Vascular Surgery

## 2017-12-26 ENCOUNTER — Telehealth: Payer: Self-pay | Admitting: Vascular Surgery

## 2017-12-26 ENCOUNTER — Telehealth: Payer: Self-pay | Admitting: Licensed Clinical Social Worker

## 2017-12-26 DIAGNOSIS — Z4901 Encounter for fitting and adjustment of extracorporeal dialysis catheter: Secondary | ICD-10-CM | POA: Diagnosis not present

## 2017-12-26 DIAGNOSIS — N186 End stage renal disease: Secondary | ICD-10-CM | POA: Diagnosis not present

## 2017-12-26 DIAGNOSIS — Z23 Encounter for immunization: Secondary | ICD-10-CM | POA: Diagnosis not present

## 2017-12-26 DIAGNOSIS — D689 Coagulation defect, unspecified: Secondary | ICD-10-CM | POA: Diagnosis not present

## 2017-12-26 NOTE — Progress Notes (Signed)
  LCSW received call from patient's sister [name], states patient's live in female friend/ HPOA who is legally blind does not want to move forward with facility placement for patient.  Sister wants to hold off on PCP completing the FL2.   Sister shared her concerns.  Update provided to PCP via in-basket message. Plan:  1. Sister will F/U with APS worker and speak with Venia Minks to share her concerns of sister's friend ability to make decisions that are in her best  Interest. 2. Will F/U with LCSW if needed.   Casimer Lanius, [place]   [phone] 11:20 AM

## 2017-12-26 NOTE — Telephone Encounter (Signed)
-----   Message from Waynetta Sandy, MD sent at 12/25/2017  8:21 AM EST ----- EVALYN SHULTIS [id number] Jan 17, 1946  12/25/2017 Pre-operative Diagnosis: End-stage renal disease, malfunction left arm AV fistula  Surgeon:  Eda Paschal. Donzetta Matters, MD  Procedure Performed: 1.  Ultrasound-guided cannulation left radial artery 2.  Left upper extremity shuntogram 3.  Drug-coated balloon angioplasty with 6 mm lutonix of the left basilic vein fistula   F/u in 2-4 weeks any provider with left arm dialysis duplex.

## 2017-12-26 NOTE — Telephone Encounter (Signed)
sch appt lvm mld ltr 01/26/2018 1pm Dialysis Duplex 130pm p/o MD

## 2017-12-28 ENCOUNTER — Telehealth: Payer: Self-pay | Admitting: *Deleted

## 2017-12-28 DIAGNOSIS — Z23 Encounter for immunization: Secondary | ICD-10-CM | POA: Diagnosis not present

## 2017-12-28 DIAGNOSIS — D689 Coagulation defect, unspecified: Secondary | ICD-10-CM | POA: Diagnosis not present

## 2017-12-28 DIAGNOSIS — N186 End stage renal disease: Secondary | ICD-10-CM | POA: Diagnosis not present

## 2017-12-28 DIAGNOSIS — Z4901 Encounter for fitting and adjustment of extracorporeal dialysis catheter: Secondary | ICD-10-CM | POA: Diagnosis not present

## 2017-12-28 MED ORDER — INSULIN GLARGINE 100 UNIT/ML SOLOSTAR PEN: 5.0000 [IU] | PEN_INJECTOR | Freq: Every day | SUBCUTANEOUS | 12 refills | Status: DC

## 2017-12-28 MED ORDER — TORSEMIDE 100 MG PO TABS: 100.0000 mg | ORAL_TABLET | Freq: Every day | ORAL | Status: DC

## 2017-12-28 NOTE — Telephone Encounter (Signed)
[name] (sister) lm on nurse line and wants to let MD know that pts CBG's have been dropping at night.  She did not say to what level but did state she had to go over there @ 3am to get her sugar to go back up.  She would like to know what Dr. Andria Frames thinks about her medications and maybe changing them. Kyung Muto, Salome Spotted, CMA

## 2017-12-28 NOTE — Telephone Encounter (Signed)
Two issues: 1. Low blood sugars at night.  (3AM)  Not after meals.  Taking lantus at 5PM Decrease lantus to 5 Units daily and take at 11 AM 2. Legs weeping badly from swelling.  Dialysis not removing.  Still makes some urine.  Will try adding torsemide to increase urine output.

## 2017-12-29 ENCOUNTER — Other Ambulatory Visit: Payer: Self-pay | Admitting: Family Medicine

## 2017-12-29 ENCOUNTER — Ambulatory Visit (INDEPENDENT_AMBULATORY_CARE_PROVIDER_SITE_OTHER): Payer: Medicare Other | Admitting: *Deleted

## 2017-12-29 DIAGNOSIS — Z7901 Long term (current) use of anticoagulants: Secondary | ICD-10-CM | POA: Diagnosis not present

## 2017-12-29 DIAGNOSIS — I480 Paroxysmal atrial fibrillation: Secondary | ICD-10-CM | POA: Diagnosis not present

## 2017-12-29 LAB — POCT INR: INR: 1.3 — AB (ref 2.0–3.0)

## 2017-12-29 MED ORDER — WARFARIN SODIUM 5 MG PO TABS: ORAL_TABLET | ORAL | 2 refills | Status: DC

## 2017-12-29 NOTE — Patient Instructions (Signed)
Description   Today take 1.5 tablets, tomorrow take 1 tablet, then resume 1/2 tablet daily except 1 tablet on Mondays, Wednesdays and Fridays. Continue Lovenox 60mg  subcutaneous into abdominal tissue daily at 8am until follow up appointment.

## 2017-12-30 DIAGNOSIS — D689 Coagulation defect, unspecified: Secondary | ICD-10-CM | POA: Diagnosis not present

## 2017-12-30 DIAGNOSIS — N186 End stage renal disease: Secondary | ICD-10-CM | POA: Diagnosis not present

## 2017-12-30 DIAGNOSIS — Z4901 Encounter for fitting and adjustment of extracorporeal dialysis catheter: Secondary | ICD-10-CM | POA: Diagnosis not present

## 2017-12-30 DIAGNOSIS — Z23 Encounter for immunization: Secondary | ICD-10-CM | POA: Diagnosis not present

## 2018-01-02 ENCOUNTER — Telehealth: Payer: Self-pay

## 2018-01-02 DIAGNOSIS — D689 Coagulation defect, unspecified: Secondary | ICD-10-CM | POA: Diagnosis not present

## 2018-01-02 DIAGNOSIS — N186 End stage renal disease: Secondary | ICD-10-CM | POA: Diagnosis not present

## 2018-01-02 DIAGNOSIS — Z23 Encounter for immunization: Secondary | ICD-10-CM | POA: Diagnosis not present

## 2018-01-02 DIAGNOSIS — Z4901 Encounter for fitting and adjustment of extracorporeal dialysis catheter: Secondary | ICD-10-CM | POA: Diagnosis not present

## 2018-01-02 NOTE — Telephone Encounter (Signed)
Christine Santos with OT called to let Dr. Andria Frames know pt has weeping legs, soaking socks. Christine Santos would like to re certify pt for OT 1x week for 3 weeks and for a nursing evaluation. I spoke Dr. Andria Frames, who gave verbal orders for the above. Christine Santos was called back at [phone] and verbal orders given. Ottis Stain, CMA

## 2018-01-02 NOTE — Telephone Encounter (Signed)
Noted and I gave the verbal order as described.

## 2018-01-03 ENCOUNTER — Ambulatory Visit (INDEPENDENT_AMBULATORY_CARE_PROVIDER_SITE_OTHER): Payer: Medicare Other | Admitting: Internal Medicine

## 2018-01-03 ENCOUNTER — Ambulatory Visit (INDEPENDENT_AMBULATORY_CARE_PROVIDER_SITE_OTHER): Payer: Medicare Other | Admitting: Podiatry

## 2018-01-03 ENCOUNTER — Other Ambulatory Visit: Payer: Self-pay

## 2018-01-03 ENCOUNTER — Encounter: Payer: Self-pay | Admitting: Podiatry

## 2018-01-03 ENCOUNTER — Ambulatory Visit: Payer: Medicare Other | Admitting: *Deleted

## 2018-01-03 VITALS — BP 128/84 | HR 103 | Temp 97.8°F | Wt 150.0 lb

## 2018-01-03 VITALS — BP 93/69

## 2018-01-03 DIAGNOSIS — Z7901 Long term (current) use of anticoagulants: Secondary | ICD-10-CM

## 2018-01-03 DIAGNOSIS — Z48812 Encounter for surgical aftercare following surgery on the circulatory system: Secondary | ICD-10-CM

## 2018-01-03 DIAGNOSIS — B351 Tinea unguium: Secondary | ICD-10-CM | POA: Diagnosis not present

## 2018-01-03 DIAGNOSIS — N186 End stage renal disease: Secondary | ICD-10-CM

## 2018-01-03 DIAGNOSIS — N184 Chronic kidney disease, stage 4 (severe): Secondary | ICD-10-CM

## 2018-01-03 DIAGNOSIS — M79674 Pain in right toe(s): Secondary | ICD-10-CM

## 2018-01-03 DIAGNOSIS — E1151 Type 2 diabetes mellitus with diabetic peripheral angiopathy without gangrene: Secondary | ICD-10-CM

## 2018-01-03 DIAGNOSIS — E43 Unspecified severe protein-calorie malnutrition: Secondary | ICD-10-CM | POA: Diagnosis not present

## 2018-01-03 DIAGNOSIS — M79675 Pain in left toe(s): Secondary | ICD-10-CM | POA: Diagnosis not present

## 2018-01-03 DIAGNOSIS — R601 Generalized edema: Secondary | ICD-10-CM | POA: Diagnosis not present

## 2018-01-03 DIAGNOSIS — E1142 Type 2 diabetes mellitus with diabetic polyneuropathy: Secondary | ICD-10-CM

## 2018-01-03 DIAGNOSIS — Z8619 Personal history of other infectious and parasitic diseases: Secondary | ICD-10-CM | POA: Diagnosis not present

## 2018-01-03 DIAGNOSIS — E162 Hypoglycemia, unspecified: Secondary | ICD-10-CM | POA: Diagnosis not present

## 2018-01-03 DIAGNOSIS — I480 Paroxysmal atrial fibrillation: Secondary | ICD-10-CM | POA: Diagnosis not present

## 2018-01-03 DIAGNOSIS — Z992 Dependence on renal dialysis: Secondary | ICD-10-CM

## 2018-01-03 LAB — POCT INR: INR: 3 (ref 2.0–3.0)

## 2018-01-03 NOTE — Progress Notes (Signed)
RFV: follow up for diarrhea/cdiff  Patient ID: Christine Santos, female   DOB: 07-Jun-1945, 72 y.o.   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 Not on any oral vanco x 2 months.She does have Complicated health most recently having hypoglycemia x 2, reduced lantus and switched to am dosing. Weight loss of 20 lb in the last year. Has been also having difficulty with hd access. Needed dr Donzetta Matters to evaluate and do intervention last month Patient no diarrhea is 2 months. Doing well  From that standpoint not needing further cdiff treatment  ROS: Weeping of legs-serous drainage not associated with any wounds or cellulitis Outpatient Encounter Medications as of 01/03/2018  Medication Sig  . ACCU-CHEK AVIVA PLUS test strip TEST THREE TIMES A DAY  . allopurinol (ZYLOPRIM) 100 MG tablet Take 0.5 tablets (50 mg total) by mouth daily.  . Ascorbic Acid (VITAMIN C) 1000 MG tablet Take 1,000 mg by mouth daily.  . carboxymethylcellulose (REFRESH PLUS) 0.5 % SOLN Place 1 drop into both eyes 3 (three) times daily.   Marland Kitchen enoxaparin (LOVENOX) 60 MG/0.6ML injection Inject 0.6 mLs (60 mg total) into the skin daily.  . ferrous sulfate 325 (65 FE) MG tablet Take 1 tablet (325 mg total) by mouth daily with breakfast.  . GNP GARLIC EXTRACT PO Take 1 tablet by mouth daily.   Marland Kitchen HUMALOG KWIKPEN 100 UNIT/ML KiwkPen Inject 5 Units as directed 3 (three) times daily after meals.   . Insulin Glargine (LANTUS SOLOSTAR) 100 UNIT/ML Solostar Pen Inject 5 Units into the skin daily.  . Melatonin 10 MG CAPS Take 10 mg by mouth at bedtime.   . metoprolol tartrate (LOPRESSOR) 25 MG tablet Take 1 tablet (25 mg total) by mouth 2 (two) times daily. (Patient taking differently: Take 12.5 mg by mouth 2 (two) times daily. )  . Multiple Vitamins-Minerals (EYE VITAMINS PO) Take 1 capsule by mouth daily. Taking one daily from  her opthamologist (Dr. Herbert Deaner)   . Multiple Vitamins-Minerals (MULTIVITAMINS THER. W/MINERALS) TABS Take 1 tablet by mouth daily.    Marland Kitchen omega-3 acid ethyl esters (LOVAZA) 1 g capsule Take 2 g by mouth 2 (two) times daily.   . pantoprazole (PROTONIX) 40 MG tablet TAKE 1 TABLET BY MOUTH DAILY (Patient taking differently: Take 40 mg by mouth at bedtime. )  . PARoxetine (PAXIL) 40 MG tablet Take 1 tablet (40 mg total) by mouth every morning.  . rosuvastatin (CRESTOR) 20 MG tablet Take 20 mg by mouth at bedtime.  . torsemide (DEMADEX) 100 MG tablet Take 1 tablet (100 mg total) by mouth daily.  . traMADol (ULTRAM) 50 MG tablet Take 1 tablet (50 mg total) by mouth every 6 (six) hours as needed (for pain.).  Marland Kitchen warfarin (COUMADIN) 5 MG tablet Take 1/2 to 1 tablet daily as directed by Coumadin clinic  . hydrALAZINE (APRESOLINE) 50 MG tablet TAKE 1 TABLET BY MOUTH THREE TIMES DAILY (Patient not taking: No sig reported)  . ondansetron (ZOFRAN) 4 MG tablet Take 1 tablet (4 mg total) by mouth every 8 (eight) hours as needed for nausea or vomiting. (Patient not taking: Reported on 12/15/2017)  . rosuvastatin (CRESTOR) 10 MG tablet Take 1 tablet (10 mg total) by mouth at bedtime. (Patient not taking: Reported on 12/15/2017)  . vancomycin (VANCOCIN) 250 MG capsule Take 1 capsule (250 mg total) by mouth 4 (four) times  daily. (Patient not taking: Reported on 12/15/2017)   No facility-administered encounter medications on file as of 01/03/2018.      Patient Active Problem List   Diagnosis Date Noted  . Pelvic pain   . Intractable vomiting   . Bacteremia   . Urinary tract infection without hematuria   . Chest pain   . Diarrhea   . ESRD on dialysis ([place])   . Edema   . Abdominal pain 10/27/2017  . Ovarian cyst rupture   . Long term (current) use of anticoagulants 10/02/2017  . Enteritis due to Clostridium difficile 07/02/2017  . Left hip pain 02/16/2017  . History of stroke 01/20/2017  . Physical  deconditioning 12/30/2016  . Transaminitis 12/23/2016  . Pleural effusion   . Hypoxia 11/25/2016  . Paroxysmal atrial fibrillation ([place]) 10/22/2015  . Sinusitis, chronic 08/05/2015  . 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 Hyperkalemia 06/21/2013  . Diabetic nephropathy ([place]) 05/23/2013  . Diabetic neuropathy ([place]) 11/30/2012  . Macular degeneration 07/04/2012  . Diabetic retinopathy ([place]) 03/20/2012  . Osteopenia 09/22/2011  . Anemia in chronic kidney disease 08/29/2011  . Gout 04/13/2011  . Stress incontinence, female 11/11/2010  . Anxiety 08/18/2010  . Chronic systolic heart failure ([place]) 02/18/2009  . SINUS BRADYCARDIA 10/29/2008  . Peripheral vascular disease ([place]) 06/18/2008  . CKD (chronic kidney disease) stage 5, GFR less than 15 ml/min ([facility]) 07/13/2007  . Chronic lymphocytic leukemia ([place]) 10/13/2006  . 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 HYPERCHOLESTEROLEMIA 03/23/2006  . Essential hypertension 03/23/2006  . Coronary atherosclerosis 03/23/2006  . REFLUX ESOPHAGITIS 03/23/2006  . IRRITABLE BOWEL SYNDROME 03/23/2006  . CYSTOCELE/RECTOCELE/PROLAPSE,UNSPEC. 03/23/2006  . Osteoarthritis, multiple sites 03/23/2006     There are no preventive care reminders to display for this patient.  Social History   Tobacco Use  . Smoking status: Never Smoker  . Smokeless tobacco: Never Used  Substance Use Topics  . Alcohol use: No  . Drug use: No   Review of Systems 12point ros reviewed,positive pertinents listed in hpi Physical Exam   BP 128/84   Pulse (!) 103   Temp 97.8 F (36.6 C) (Oral)   Wt 150 lb (68 kg)   BMI 24.96 kg/m   Physical Exam  Constitutional:  oriented to person, place, and time. Appears chronically ill . No distress.  HENT: [place]/AT, PERRLA, no scleral icterus Mouth/Throat: Oropharynx is clear and moist. No oropharyngeal exudate.  Cardiovascular: Normal rate, regular rhythm and  normal heart sounds. Exam reveals no gallop and no friction rub.  No murmur heard.  Pulmonary/Chest: Effort normal and breath sounds normal. No respiratory distress.  has no wheezes.  Neck = supple, no nuchal rigidity Abdominal: Soft. Bowel sounds are normal.  exhibits no distension. There is no tenderness.  Lymphadenopathy: no cervical adenopathy. No axillary adenopathy Ext: pitting edema in legs +anasarca Skin: Skin is warm and dry. No rash noted. No erythema.  Psychiatric: a normal mood and affect.  behavior is normal.   Lab Results  Component Value Date   HEPBSAB Non Reactive 11/01/2017   No results found for: RPR, LABRPR  CBC Lab Results  Component Value Date   WBC 12.2 (H) 12/20/2017   RBC 3.39 (L) 12/20/2017   HGB 11.9 (L) 12/25/2017   HCT 35.0 (L) 12/25/2017   PLT 175 12/20/2017   MCV 102.1 (H) 12/20/2017   MCH 29.8 12/20/2017   MCHC 29.2 (L) 12/20/2017   RDW 18.6 (H) 12/20/2017  LYMPHSABS 2.3 11/04/2017   MONOABS 0.9 11/04/2017   EOSABS 0.1 11/04/2017    BMET Lab Results  Component Value Date   NA 139 12/25/2017   K 4.2 12/25/2017   CL 101 12/25/2017   CO2 27 12/20/2017   GLUCOSE 112 (H) 12/25/2017   BUN 40 (H) 12/25/2017   CREATININE 4.30 (H) 12/25/2017   CALCIUM 7.5 (L) 12/20/2017   GFRNONAA 23 (L) 12/20/2017   GFRAA 26 (L) 12/20/2017      Assessment and Plan Hypoglycemia = recommend she takes night time snack every night, if still ahving BS <70 in the am to call her pcp and may need even less of lantus dosing  Anasarca= likley from poor nutrition/weight loss. Agreed with all the other advice she is getting from dr Andria Frames - liberalize her diet, eat anything, needs to improve protein intake  cdifficile infection = appears stable for now. No signs of recurrence. To retest stool if it were to recur

## 2018-01-03 NOTE — Patient Instructions (Addendum)
Description   Change your dose to 1/2  tablet daily except 1 tablet on Mondays and Fridays. Discontinue Lovenox injection. Recheck in one week.

## 2018-01-03 NOTE — Patient Instructions (Signed)
Diabetes and Foot Care Diabetes may cause you to have problems because of poor blood supply (circulation) to your feet and legs. This may cause the skin on your feet to become thinner, break easier, and heal more slowly. Your skin may become dry, and the skin may peel and crack. You may also have nerve damage in your legs and feet causing decreased feeling in them. You may not notice minor injuries to your feet that could lead to infections or more serious problems. Taking care of your feet is one of the most important things you can do for yourself. Follow these instructions at home:  Wear shoes at all times, even in the house. Do not go barefoot. Bare feet are easily injured.  Check your feet daily for blisters, cuts, and redness. If you cannot see the bottom of your feet, use a mirror or ask someone for help.  Wash your feet with warm water (do not use hot water) and mild soap. Then pat your feet and the areas between your toes until they are completely dry. Do not soak your feet as this can dry your skin.  Apply a moisturizing lotion or petroleum jelly (that does not contain alcohol and is unscented) to the skin on your feet and to dry, brittle toenails. Do not apply lotion between your toes.  Trim your toenails straight across. Do not dig under them or around the cuticle. File the edges of your nails with an emery board or nail file.  Do not cut corns or calluses or try to remove them with medicine.  Wear clean socks or stockings every day. Make sure they are not too tight. Do not wear knee-high stockings since they may decrease blood flow to your legs.  Wear shoes that fit properly and have enough cushioning. To break in new shoes, wear them for just a few hours a day. This prevents you from injuring your feet. Always look in your shoes before you put them on to be sure there are no objects inside.  Do not cross your legs. This may decrease the blood flow to your feet.  If you find a  minor scrape, cut, or break in the skin on your feet, keep it and the skin around it clean and dry. These areas may be cleansed with mild soap and water. Do not cleanse the area with peroxide, alcohol, or iodine.  When you remove an adhesive bandage, be sure not to damage the skin around it.  If you have a wound, look at it several times a day to make sure it is healing.  Do not use heating pads or hot water bottles. They may burn your skin. If you have lost feeling in your feet or legs, you may not know it is happening until it is too late.  Make sure your health care provider performs a complete foot exam at least annually or more often if you have foot problems. Report any cuts, sores, or bruises to your health care provider immediately. Contact a health care provider if:  You have an injury that is not healing.  You have cuts or breaks in the skin.  You have an ingrown nail.  You notice redness on your legs or feet.  You feel burning or tingling in your legs or feet.  You have pain or cramps in your legs and feet.  Your legs or feet are numb.  Your feet always feel cold. Get help right away if:  There is increasing   redness, swelling, or pain in or around a wound.  There is a red line that goes up your leg.  Pus is coming from a wound.  You develop a fever or as directed by your health care provider.  You notice a bad smell coming from an ulcer or wound. This information is not intended to replace advice given to you by your health care provider. Make sure you discuss any questions you have with your health care provider. Document Released: 01/08/2000 Document Revised: 06/18/2015 Document Reviewed: 06/19/2012 Elsevier Interactive Patient Education  2017 [employer]  Diabetic Neuropathy Diabetic neuropathy is a nerve disease or nerve damage that is caused by diabetes mellitus. About half of all people with diabetes mellitus have some form of nerve damage. Nerve damage  is more common in those who have had diabetes mellitus for many years and who generally have not had good control of their blood sugar (glucose) level. Diabetic neuropathy is a common complication of diabetes mellitus. There are three common types of diabetic neuropathy and a fourth type that is less common and less understood:  Peripheral neuropathy-This is the most common type of diabetic neuropathy. It causes damage to the nerves of the feet and legs first and then eventually the hands and arms. The damage affects the ability to sense touch.  Autonomic neuropathy-This type causes damage to the autonomic nervous system, which controls the following functions: ? Heartbeat. ? Body temperature. ? Blood pressure. ? Urination. ? Digestion. ? Sweating. ? Sexual function.  Focal neuropathy-Focal neuropathy can be painful and unpredictable and occurs most often in older adults with diabetes mellitus. It involves a specific nerve or one area and often comes on suddenly. It usually does not cause long-term problems.  Radiculoplexus neuropathy- Sometimes called lumbosacral radiculoplexus neuropathy, radiculoplexus neuropathy affects the nerves of the thighs, hips, buttocks, or legs. It is more common in people with type 2 diabetes mellitus and in older men. It is characterized by debilitating pain, weakness, and atrophy, usually in the thigh muscles.  What are the causes? The cause of peripheral, autonomic, and focal neuropathies is diabetes mellitus that is uncontrolled and high glucose levels. The cause of radiculoplexus neuropathy is unknown. However, it is thought to be caused by inflammation related to uncontrolled glucose levels. What are the signs or symptoms? Peripheral Neuropathy Peripheral neuropathy develops slowly over time. When the nerves of the feet and legs no longer work there may be:  Burning, stabbing, or aching pain in the legs or feet.  Inability to feel pressure or pain in your  feet. This can lead to: ? Thick calluses over pressure areas. ? Pressure sores. ? Ulcers.  Foot deformities.  Reduced ability to feel temperature changes.  Muscle weakness.  Autonomic Neuropathy The symptoms of autonomic neuropathy vary depending on which nerves are affected. Symptoms may include:  Problems with digestion, such as: ? Feeling sick to your stomach (nausea). ? Vomiting. ? Bloating. ? Constipation. ? Diarrhea. ? Abdominal pain.  Difficulty with urination. This occurs if you lose your ability to sense when your bladder is full. Problems include: ? Urine leakage (incontinence). ? Inability to empty your bladder completely (retention).  Rapid or irregular heartbeat (palpitations).  Blood pressure drops when you stand up (orthostatic hypotension). When you stand up you may feel: ? Dizzy. ? Weak. ? Faint.  In men, inability to attain and maintain an erection.  In women, vaginal dryness and problems with decreased sexual desire and arousal.  Problems with body temperature  regulation.  Increased or decreased sweating.  Focal Neuropathy  Abnormal eye movements or abnormal alignment of both eyes.  Weakness in the wrist.  Foot drop. This results in an inability to lift the foot properly and abnormal walking or foot movement.  Paralysis on one side of your face (Bell palsy).  Chest or abdominal pain. Radiculoplexus Neuropathy  Sudden, severe pain in your hip, thigh, or buttocks.  Weakness and wasting of thigh muscles.  Difficulty rising from a seated position.  Abdominal swelling.  Unexplained weight loss (usually more than 10 lb [4.5 kg]). How is this diagnosed? Peripheral Neuropathy Your senses may be tested. Sensory function testing can be done with:  A light touch using a monofilament.  A vibration with tuning fork.  A sharp sensation with a pin prick.  Other tests that can help diagnose neuropathy are:  Nerve conduction velocity. This  test checks the transmission of an electrical current through a nerve.  Electromyography. This shows how muscles respond to electrical signals transmitted by nearby nerves.  Quantitative sensory testing. This is used to assess how your nerves respond to vibrations and changes in temperature.  Autonomic Neuropathy Diagnosis is often based on reported symptoms. Tell your health care provider if you experience:  Dizziness.  Constipation.  Diarrhea.  Inappropriate urination or inability to urinate.  Inability to get or maintain an erection.  Tests that may be done include:  Electrocardiography or Holter monitor. These are tests that can help show problems with the heart rate or heart rhythm.  An X-ray exam may be done.  Focal Neuropathy Diagnosis is made based on your symptoms and what your health care provider finds during your exam. Other tests may be done. They may include:  Nerve conduction velocities. This checks the transmission of electrical current through a nerve.  Electromyography. This shows how muscles respond to electrical signals transmitted by nearby nerves.  Quantitative sensory testing. This test is used to assess how your nerves respond to vibration and changes in temperature.  Radiculoplexus Neuropathy  Often the first thing is to eliminate any other issue or problems that might be the cause, as there is no standard test for diagnosis.  X-ray exam of your spine and lumbar region.  Spinal tap to rule out cancer.  MRI to rule out other lesions. How is this treated? Once nerve damage occurs, it cannot be reversed. The goal of treatment is to keep the disease or nerve damage from getting worse and affecting more nerve fibers. Controlling your blood glucose level is the key. Most people with radiculoplexus neuropathy see at least a partial improvement over time. You will need to keep your blood glucose and HbA1c levels in the target range determined by your  health care provider. Things that help control blood glucose levels include:  Blood glucose monitoring.  Meal planning.  Physical activity.  Diabetes medicine.  Over time, maintaining lower blood glucose levels helps lessen symptoms. Sometimes, prescription pain medicine is needed. Follow these instructions at home:  Do not smoke.  Keep your blood glucose level in the range that you and your health care provider have determined acceptable for you.  Keep your blood pressure level in the range that you and your health care provider have determined acceptable for you.  Eat a well-balanced diet.  Be physically active every day. Include strength training and balance exercises.  Protect your feet. ? Check your feet every day for sores, cuts, blisters, or signs of infection. ? Wear padded socks  and supportive shoes. Use orthotic inserts, if necessary. ? Regularly check the insides of your shoes for worn spots. Make sure there are no rocks or other items inside your shoes before you put them on. Contact a health care provider if:  You have burning, stabbing, or aching pain in the legs or feet.  You are unable to feel pressure or pain in your feet.  You develop problems with digestion such as: ? Nausea. ? Vomiting. ? Bloating. ? Constipation. ? Diarrhea. ? Abdominal pain.  You have difficulty with urination, such as: ? Incontinence. ? Retention.  You have palpitations.  You develop orthostatic hypotension. When you stand up you may feel: ? Dizzy. ? Weak. ? Faint.  You cannot attain and maintain an erection (in men).  You have vaginal dryness and problems with decreased sexual desire and arousal (in women).  You have severe pain in your thighs, legs, or buttocks.  You have unexplained weight loss. This information is not intended to replace advice given to you by your health care provider. Make sure you discuss any questions you have with your health care  provider. Document Released: 03/21/2001 Document Revised: 06/18/2015 Document Reviewed: 06/21/2012 Elsevier Interactive Patient Education  2017 [employer].

## 2018-01-04 DIAGNOSIS — N186 End stage renal disease: Secondary | ICD-10-CM | POA: Diagnosis not present

## 2018-01-04 DIAGNOSIS — D689 Coagulation defect, unspecified: Secondary | ICD-10-CM | POA: Diagnosis not present

## 2018-01-04 DIAGNOSIS — Z4901 Encounter for fitting and adjustment of extracorporeal dialysis catheter: Secondary | ICD-10-CM | POA: Diagnosis not present

## 2018-01-04 DIAGNOSIS — Z23 Encounter for immunization: Secondary | ICD-10-CM | POA: Diagnosis not present

## 2018-01-05 ENCOUNTER — Telehealth: Payer: Self-pay

## 2018-01-05 NOTE — Telephone Encounter (Signed)
Please let her know She can take OTC robitussin but if she feels bad or feels she needs an antibiotic she needs to be seen.    She is medically complex   Thanks  LC

## 2018-01-05 NOTE — Telephone Encounter (Signed)
See prior note about antibiotics request  I will let Dr Andria Frames decide on need/length of [facility] and whether Unna boot vs ace wraps is most appropriate for her legs

## 2018-01-05 NOTE — Telephone Encounter (Signed)
Pt informed. Pt also told that if she felt worse over the weekend she could go to Urgent Care. Pt denies fever or shortness of breath. Pt has an appointment with PCP next week. Ottis Stain, CMA

## 2018-01-05 NOTE — Telephone Encounter (Signed)
Christine Bacon, RN with [facility], called for verbal orders after doing nursing assessment today:  1. [place] skilled nursing 2x/week x 8 weeks.  2. Louretta Parma Boot wraps 2x/week.for open weeping wound and edema RLE.  3. Patient has a cough. Afebrile, vital signs stable, lungs clear. Patient would like zpack for cough  Call back is [phone]  Danley Danker, RN [facility])

## 2018-01-05 NOTE — Telephone Encounter (Signed)
Patient left message to ask for note to be sent to PCP to call something in for her productive cough or yellow and green mucus. Hopes she can get something for this w/o office visit.  Call back is [phone] or [phone].  Danley Danker, RN [facility] RN)

## 2018-01-06 DIAGNOSIS — D689 Coagulation defect, unspecified: Secondary | ICD-10-CM | POA: Diagnosis not present

## 2018-01-06 DIAGNOSIS — Z23 Encounter for immunization: Secondary | ICD-10-CM | POA: Diagnosis not present

## 2018-01-06 DIAGNOSIS — N186 End stage renal disease: Secondary | ICD-10-CM | POA: Diagnosis not present

## 2018-01-06 DIAGNOSIS — Z4901 Encounter for fitting and adjustment of extracorporeal dialysis catheter: Secondary | ICD-10-CM | POA: Diagnosis not present

## 2018-01-08 NOTE — Telephone Encounter (Signed)
Pt called again asking about an antibiotic and something for cough. Informed pt she will need to be seen. Pt has an apt on Wednesday with pcp.

## 2018-01-08 NOTE — Telephone Encounter (Signed)
Verbal orders for unna boots and home health visits given as requested.

## 2018-01-08 NOTE — Telephone Encounter (Signed)
Noted and agree. 

## 2018-01-09 DIAGNOSIS — D689 Coagulation defect, unspecified: Secondary | ICD-10-CM | POA: Diagnosis not present

## 2018-01-09 DIAGNOSIS — Z23 Encounter for immunization: Secondary | ICD-10-CM | POA: Diagnosis not present

## 2018-01-09 DIAGNOSIS — N186 End stage renal disease: Secondary | ICD-10-CM | POA: Diagnosis not present

## 2018-01-09 DIAGNOSIS — Z4901 Encounter for fitting and adjustment of extracorporeal dialysis catheter: Secondary | ICD-10-CM | POA: Diagnosis not present

## 2018-01-10 ENCOUNTER — Ambulatory Visit: Payer: Medicare Other | Admitting: Family Medicine

## 2018-01-10 ENCOUNTER — Encounter: Payer: Self-pay | Admitting: Family Medicine

## 2018-01-10 ENCOUNTER — Ambulatory Visit
Admission: RE | Admit: 2018-01-10 | Discharge: 2018-01-10 | Disposition: A | Discharge status: Home / Self Care | Payer: Medicare Other | Source: Ambulatory Visit | Attending: Family Medicine | Admitting: Family Medicine

## 2018-01-10 ENCOUNTER — Other Ambulatory Visit: Payer: Self-pay

## 2018-01-10 DIAGNOSIS — Z992 Dependence on renal dialysis: Secondary | ICD-10-CM

## 2018-01-10 DIAGNOSIS — R05 Cough: Secondary | ICD-10-CM

## 2018-01-10 DIAGNOSIS — R059 Cough, unspecified: Secondary | ICD-10-CM

## 2018-01-10 DIAGNOSIS — D631 Anemia in chronic kidney disease: Secondary | ICD-10-CM

## 2018-01-10 DIAGNOSIS — E43 Unspecified severe protein-calorie malnutrition: Secondary | ICD-10-CM | POA: Insufficient documentation

## 2018-01-10 DIAGNOSIS — N186 End stage renal disease: Secondary | ICD-10-CM | POA: Diagnosis not present

## 2018-01-10 DIAGNOSIS — I5022 Chronic systolic (congestive) heart failure: Secondary | ICD-10-CM

## 2018-01-10 DIAGNOSIS — R5381 Other malaise: Secondary | ICD-10-CM

## 2018-01-10 MED ORDER — BENZONATATE 100 MG PO CAPS: 100.0000 mg | ORAL_CAPSULE | Freq: Two times a day (BID) | ORAL | 2 refills | Status: DC | PRN

## 2018-01-10 NOTE — Patient Instructions (Signed)
Keep pushing. Eat more. I will call with the lab and X ray results. See me in 3-4 weeks.

## 2018-01-11 ENCOUNTER — Telehealth: Payer: Self-pay | Admitting: *Deleted

## 2018-01-11 ENCOUNTER — Encounter: Payer: Self-pay | Admitting: Family Medicine

## 2018-01-11 DIAGNOSIS — N186 End stage renal disease: Secondary | ICD-10-CM | POA: Diagnosis not present

## 2018-01-11 DIAGNOSIS — D689 Coagulation defect, unspecified: Secondary | ICD-10-CM | POA: Diagnosis not present

## 2018-01-11 DIAGNOSIS — Z23 Encounter for immunization: Secondary | ICD-10-CM | POA: Diagnosis not present

## 2018-01-11 DIAGNOSIS — Z4901 Encounter for fitting and adjustment of extracorporeal dialysis catheter: Secondary | ICD-10-CM | POA: Diagnosis not present

## 2018-01-11 LAB — CBC
Hematocrit: 35.1 % (ref 34.0–46.6)
Hemoglobin: 10.5 g/dL — ABNORMAL LOW (ref 11.1–15.9)
MCH: 29.7 pg (ref 26.6–33.0)
MCHC: 29.9 g/dL — AB (ref 31.5–35.7)
MCV: 99 fL — ABNORMAL HIGH (ref 79–97)
Platelets: 181 10*3/uL (ref 150–450)
RBC: 3.53 x10E6/uL — ABNORMAL LOW (ref 3.77–5.28)
RDW: 15.2 % (ref 12.3–15.4)
WBC: 13.8 10*3/uL — ABNORMAL HIGH (ref 3.4–10.8)

## 2018-01-11 LAB — CMP14+EGFR
ALT: 45 IU/L — ABNORMAL HIGH (ref 0–32)
AST: 40 IU/L (ref 0–40)
Albumin/Globulin Ratio: 2.1 (ref 1.2–2.2)
Albumin: 3.4 g/dL — ABNORMAL LOW (ref 3.5–4.8)
Alkaline Phosphatase: 183 IU/L — ABNORMAL HIGH (ref 39–117)
BUN/Creatinine Ratio: 8 — ABNORMAL LOW (ref 12–28)
BUN: 33 mg/dL — ABNORMAL HIGH (ref 8–27)
Bilirubin Total: 0.5 mg/dL (ref 0.0–1.2)
CALCIUM: 8.7 mg/dL (ref 8.7–10.3)
CO2: 29 mmol/L (ref 20–29)
Chloride: 92 mmol/L — ABNORMAL LOW (ref 96–106)
Creatinine, Ser: 3.9 mg/dL — ABNORMAL HIGH (ref 0.57–1.00)
GFR calc Af Amer: 13 mL/min/{1.73_m2} — ABNORMAL LOW (ref >59)
GFR calc non Af Amer: 11 mL/min/{1.73_m2} — ABNORMAL LOW (ref >59)
Globulin, Total: 1.6 g/dL (ref 1.5–4.5)
Glucose: 300 mg/dL — ABNORMAL HIGH (ref 65–99)
Potassium: 5.6 mmol/L — ABNORMAL HIGH (ref 3.5–5.2)
Sodium: 138 mmol/L (ref 134–144)
Total Protein: 5 g/dL — ABNORMAL LOW (ref 6.0–8.5)

## 2018-01-11 NOTE — Assessment & Plan Note (Signed)
Check Hgb.  Especially since on anticoag.

## 2018-01-11 NOTE — Assessment & Plan Note (Signed)
I conceptualize the fluid retention more from ESRD than from her CHF.  Repeat CXR given DOE and Hx of pleural effusion.

## 2018-01-11 NOTE — Telephone Encounter (Signed)
Called and gave results

## 2018-01-11 NOTE — Assessment & Plan Note (Signed)
Plays a major role in current weakness.

## 2018-01-11 NOTE — Assessment & Plan Note (Signed)
Unclear why we can't get fluid off.  Is it hypoproteinemia?  Dietary indiscretion?  Check labs.

## 2018-01-11 NOTE — Telephone Encounter (Signed)
Pt states she is returning a call to Dr. Andria Frames.  Libbie Bartley, Salome Spotted, CMA

## 2018-01-11 NOTE — Assessment & Plan Note (Signed)
Recheck albumin and push PO intake.  I think this will greatly help her generalized weakness.

## 2018-01-11 NOTE — Progress Notes (Signed)
Established Patient Office Visit  Subjective:  Patient ID: Christine Santos, female    DOB: 1945/03/12  Age: 72 y.o. MRN: [id number]  CC:  Chief Complaint  Patient presents with  . Cough  . Nasal Congestion    HPI Christine Santos presents for FU ear infection and continued leg swelling with drainage from a right leg wound.  1. Generalized weakness, dyspnea on exertion and poor appetitie.  She is a bit stronger than she was one month ago.  Appetite has improved a little.  She still needs to force herself to eat.  She is getting physical therapy. 2. Edema of legs continues.  She has not had a repeat CXR since hospital discharge (did have a large pleural effusion.)  Also has not had albumin repeated which was low at 2.4.  They aim for 3-4 liters off at dialysis.  Sometimes need to cut short due to pressures. 3. Right leg (calf) sore non healing and often drains clear fluid.   4. Decreased hearing right ear.  Treated for OM.  Improved but not back to normal.  Past Medical History:  Diagnosis Date  . Anemia 08/29/2011  . Anxiety   . Arthritis    "legs" (01/22/2013)  . CAD (coronary artery disease)    s/p CABG in 1999  . Cataract   . Chronic kidney disease    Sees Dr [name]  . CLL (chronic lymphoblastic leukemia)    Kidney  . Diastolic heart failure ([place])   . GERD (gastroesophageal reflux disease)   . Gout   . H/O hiatal hernia   . Heart murmur   . Hyperlipidemia   . Hypertension   . 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 PAD (peripheral artery disease) ([place])   . Paroxysmal atrial fibrillation (H[facility])    a. identified on ILR as part of StrokeAF study  . Peripheral vascular disease ([place])   . Pneumonia 11/2016  . 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 Still has balance issues     Past Surgical History:  Procedure Laterality Date  . A/V FISTULAGRAM Left 12/25/2017   Procedure: A/V FISTULAGRAM;  Surgeon: Waynetta Sandy, MD;  Location: [facility];  Service: Cardiovascular;  Laterality: Left;  . A/V SHUNT INTERVENTION Left 12/25/2017   Procedure: A/V SHUNT INTERVENTION;  Surgeon: Waynetta Sandy, MD;  Location: [facility];  Service: Cardiovascular;  Laterality: Left;  . ANGIOPLASTY / STENTING FEMORAL Right 09/2010   SFA/notes 11/25/2010 (01/22/2013)  . ANGIOPLASTY / STENTING FEMORAL Left 11/2010   SFA/notes 11/25/2010 (01/22/2013)  . ANGIOPLASTY / STENTING ILIAC     [name] 11/25/2010 (01/22/2013)  . BASCILIC VEIN TRANSPOSITION Left 08/23/2017   Procedure: FIRST STAGE BASILIC VEIN TRANSPOSITION LEFT UPPER EXTREMITY;  Surgeon: [name], MD;  Location: [facility];  Service: Vascular;  Laterality: Left;  . BASCILIC VEIN TRANSPOSITION Left 10/23/2017   Procedure: SECOND STAGE BASILIC VEIN TRANSPOSITION LEFT ARM;  Surgeon: [name], MD;  Location: [facility];  Service: Vascular;  Laterality: Left;  . CATARACT EXTRACTION W/ INTRAOCULAR LENS  IMPLANT, BILATERAL Bilateral ?2011  . CHOLECYSTECTOMY  1993  . CORONARY ARTERY BYPASS GRAFT  10/1994   "CABG X3"  . EP IMPLANTABLE DEVICE N/A 09/11/2015   Procedure: Loop Recorder Insertion;  Surgeon: Thompson Grayer, MD;  Location: [facility];  Service: Cardiovascular;  Laterality: N/A;  . HEEL SPUR EXCISION Bilateral  1970's  . IR FLUORO GUIDE CV LINE RIGHT  11/01/2017  . IR FLUORO GUIDE CV LINE RIGHT  11/05/2017  . IR US GUIDE VASC ACCESS RIGHT  11/01/2017  . IR US GUIDE VASC ACCESS RIGHT  11/05/2017  . LOWER EXTREMITY ANGIOGRAM N/A 01/22/2013   Procedure: LOWER EXTREMITY ANGIOGRAM;  Surgeon: [name], MD;  Location: [facility];  Service: Cardiovascular;  Laterality: N/A;  . SHOULDER OPEN ROTATOR CUFF REPAIR Bilateral 1990's   "2 times on 1 side; once on the other"  . TRANSLUMINAL ATHERECTOMY FEMORAL ARTERY Right 01/22/2013   & balloon  . TUBAL LIGATION  1980's  . VIDEO ASSISTED THORACOSCOPY (VATS)/EMPYEMA Left  11/29/2016   Procedure: VIDEO ASSISTED THORACOSCOPY (VATS)/EMPYEMA;  Surgeon: [name], MD;  Location: [facility] OR;  Service: Thoracic;  Laterality: Left;  VIDEO ASSISTED THORACOSCOPY (VATS)/EMPYEMA    Family History  Problem Relation Age of Onset  . Heart disease Mother   . Heart attack Mother   . Hypertension Mother   . Kidney disease Mother   . Pneumonia Father   . Heart attack Father   . Hyperlipidemia Father   . Diabetes Sister   . Breast cancer Sister   . Arthritis Brother   . Diabetes Brother   . Heart attack Sister   . Obesity Sister   . Hypertension Sister   . Heart attack Sister   . Cancer Sister        breast  . Hyperlipidemia Sister   . Hypertension Sister   . Hyperlipidemia Sister   . Breast cancer Maternal Grandmother     Social History   Socioeconomic History  . Marital status: Divorced    Spouse name: Not on file  . Number of children: 2  . Years of education: 18  . Highest education level: Not on file  Occupational History  . Occupation: DISABILITY    Employer: OTHER    Comment: Worked at [employer]  . Financial resource strain: Not on file  . Food insecurity:    Worry: Not on file    Inability: Not on file  . Transportation needs:    Medical: Not on file    Non-medical: Not on file  Tobacco Use  . Smoking status: Never Smoker  . Smokeless tobacco: Never Used  Substance and Sexual Activity  . Alcohol use: No  . Drug use: No  . Sexual activity: Not Currently  Lifestyle  . Physical activity:    Days per week: Not on file    Minutes per session: Not on file  . Stress: Not on file  Relationships  . Social connections:    Talks on phone: Not on file    Gets together: Not on file    Attends religious service: Not on file    Active member of club or organization: Not on file    Attends meetings of clubs or organizations: Not on file    Relationship status: Not on file  . Intimate partner violence:    Fear of current or ex  partner: Not on file    Emotionally abused: Not on file    Physically abused: Not on file    Forced sexual activity: Not on file  Other Topics Concern  . Not on file  Social History Narrative   Health Care POA:    Emergency Contact: sister, [name] (h) [phone]   End of Life Plan:    Who lives with you: female friend  Any pets: none   Diet: Pt has a varied diet of protein, starch, vegetables   Exercise: Pt does not have any regular exercise routine.   Seatbelts: Pt reports wearing seatbelt when in vehicles.    Sun Exposure/Protection: Pt reports using sun screen.   Hobbies: going to lake, biking, blue grass music      Current Social History 08/16/2016        Patient lives with [name] (Significant other) in one level home 08/16/2016   Transportation: Patient has own vehicle 08/16/2016   Important Relationships [name] and sisters, [name] and Vaughan Basta 08/16/2016    Pets: None 08/16/2016   Education / Work:  12 th grade/ Disabled ([employer]) 08/16/2016   Interests / Fun: Going to [facility], out to eat with friends 08/16/2016   Current Stressors: Bilat foot pain (neuropathy) 08/16/2016   Religious / Personal Beliefs: Raised [employer], goes to [employer] 08/16/2016   L. Ducatte, RN, BSN                                                                                                            Outpatient Medications Prior to Visit  Medication Sig Dispense Refill  . ACCU-CHEK AVIVA PLUS test strip TEST THREE TIMES A DAY 300 each 3  . allopurinol (ZYLOPRIM) 100 MG tablet Take 0.5 tablets (50 mg total) by mouth daily. 30 tablet 0  . Ascorbic Acid (VITAMIN C) 1000 MG tablet Take 1,000 mg by mouth daily.    . carboxymethylcellulose (REFRESH PLUS) 0.5 % SOLN Place 1 drop into both eyes 3 (three) times daily.     Marland Kitchen enoxaparin (LOVENOX) 60 MG/0.6ML injection Inject 0.6 mLs (60 mg total) into the skin daily. 10 Syringe 1  . ferrous sulfate 325 (65 FE) MG tablet Take 1 tablet (325  mg total) by mouth daily with breakfast.  3  . GNP GARLIC EXTRACT PO Take 1 tablet by mouth daily.     Marland Kitchen HUMALOG KWIKPEN 100 UNIT/ML KiwkPen Inject 5 Units as directed 3 (three) times daily after meals.   3  . hydrALAZINE (APRESOLINE) 50 MG tablet TAKE 1 TABLET BY MOUTH THREE TIMES DAILY 270 tablet 3  . Insulin Glargine (LANTUS SOLOSTAR) 100 UNIT/ML Solostar Pen Inject 5 Units into the skin daily. 15 mL 12  . Melatonin 10 MG CAPS Take 10 mg by mouth at bedtime.     . metoprolol tartrate (LOPRESSOR) 25 MG tablet Take 1 tablet (25 mg total) by mouth 2 (two) times daily. (Patient taking differently: Take 12.5 mg by mouth 2 (two) times daily. ) 30 tablet 0  . Multiple Vitamins-Minerals (EYE VITAMINS PO) Take 1 capsule by mouth daily. Taking one daily from her opthamologist (Dr. Herbert Deaner)     . Multiple Vitamins-Minerals (MULTIVITAMINS THER. W/MINERALS) TABS Take 1 tablet by mouth daily.      Marland Kitchen omega-3 acid ethyl esters (LOVAZA) 1 g capsule Take 2 g by mouth 2 (two) times daily.     . ondansetron (ZOFRAN) 4 MG tablet Take 1 tablet (4 mg  total) by mouth every 8 (eight) hours as needed for nausea or vomiting. 20 tablet 1  . pantoprazole (PROTONIX) 40 MG tablet TAKE 1 TABLET BY MOUTH DAILY (Patient taking differently: Take 40 mg by mouth at bedtime. ) 90 tablet 3  . PARoxetine (PAXIL) 40 MG tablet Take 1 tablet (40 mg total) by mouth every morning. 90 tablet 3  . rosuvastatin (CRESTOR) 10 MG tablet Take 1 tablet (10 mg total) by mouth at bedtime. 30 tablet 0  . rosuvastatin (CRESTOR) 20 MG tablet Take 20 mg by mouth at bedtime.    . torsemide (DEMADEX) 100 MG tablet Take 1 tablet (100 mg total) by mouth daily.    . traMADol (ULTRAM) 50 MG tablet Take 1 tablet (50 mg total) by mouth every 6 (six) hours as needed (for pain.). 360 tablet 1  . vancomycin (VANCOCIN) 250 MG capsule Take 1 capsule (250 mg total) by mouth 4 (four) times daily. 40 capsule 1  . warfarin (COUMADIN) 5 MG tablet Take 1/2 to 1 tablet  daily as directed by Coumadin clinic 30 tablet 2   No facility-administered medications prior to visit.     Allergies  Allergen Reactions  . Cefepime Anaphylaxis    Throat swelling  . Other Other (See Comments)    Darvocet-rash   . Amoxicillin Rash    Has patient had a PCN reaction causing immediate rash, facial/tongue/throat swelling, SOB or lightheadedness with hypotension: No Has patient had a PCN reaction causing severe rash involving mucus membranes or skin necrosis: No Has patient had a PCN reaction that required hospitalization: No Has patient had a PCN reaction occurring within the last 10 years: No If all of the above answers are "NO", then may proceed with Cephalosporin use.   . Codeine Phosphate Rash  . Penicillins Rash    Has patient had a PCN reaction causing immediate rash, facial/tongue/throat swelling, SOB or lightheadedness with hypotension: No Has patient had a PCN reaction causing severe rash involving mucus membranes or skin necrosis: No Has patient had a PCN reaction that required hospitalization: No Has patient had a PCN reaction occurring within the last 10 years: No If all of the above answers are "NO", then may proceed with Cephalosporin use.      ROS Review of Systems    Objective:    Physical Exam  BP 110/61   Pulse 71   Temp 98.2 F (36.8 C) (Oral)   Wt 147 lb (66.7 kg)   SpO2 97%   BMI 24.46 kg/m  Wt Readings from Last 3 Encounters:  01/10/18 147 lb (66.7 kg)  01/03/18 150 lb (68 kg)  12/25/17 147 lb (66.7 kg)   Wt noted 40 lb wt loss over past year - likely more since she is currently edematous. Rt TM partially occluded with cerumen but normal that I can see. Lungs clear Cardiac RRR without m or g Legs 2-3+ edema.bilaterally.  Right calf wound clean without erythema.  Pea sized.    Health Maintenance Due  Topic Date Due  . OPHTHALMOLOGY EXAM  01/09/2018    There are no preventive care reminders to display for this  patient.  Lab Results  Component Value Date   TSH 3.397 09/04/2015   Lab Results  Component Value Date   WBC 13.8 (H) 01/10/2018   HGB 10.5 (L) 01/10/2018   HCT 35.1 01/10/2018   MCV 99 (H) 01/10/2018   PLT 181 01/10/2018   Lab Results  Component Value Date   NA 138  01/10/2018   K 5.6 (H) 01/10/2018   CHLORIDE 104 09/06/2016   CO2 29 01/10/2018   GLUCOSE 300 (H) 01/10/2018   BUN 33 (H) 01/10/2018   CREATININE 3.90 (H) 01/10/2018   BILITOT 0.5 01/10/2018   ALKPHOS 183 (H) 01/10/2018   AST 40 01/10/2018   ALT 45 (H) 01/10/2018   PROT 5.0 (L) 01/10/2018   ALBUMIN 3.4 (L) 01/10/2018   CALCIUM 8.7 01/10/2018   ANIONGAP 10 12/20/2017   EGFR 13 (L) 09/06/2016   GFR 27.44 (L) 02/08/2013   Lab Results  Component Value Date   CHOL 169 09/04/2015   Lab Results  Component Value Date   HDL 32 (L) 09/04/2015   Lab Results  Component Value Date   LDLCALC 99 09/04/2015   Lab Results  Component Value Date   TRIG 188 (H) 09/04/2015   Lab Results  Component Value Date   CHOLHDL 5.3 09/04/2015   Lab Results  Component Value Date   HGBA1C 7.0 (H) 10/28/2017      Assessment & Plan:   Problem List Items Addressed This Visit    Protein-calorie malnutrition, severe ([place])   Relevant Orders   CMP14+EGFR (Completed)   Cough   Relevant Medications   benzonatate (TESSALON) 100 MG capsule   Other Relevant Orders   DG Chest 2 View (Completed)   Anemia in chronic kidney disease   Relevant Orders   CBC (Completed)      Meds ordered this encounter  Medications  . benzonatate (TESSALON) 100 MG capsule    Sig: Take 1 capsule (100 mg total) by mouth 2 (two) times daily as needed for cough.    Dispense:  20 capsule    Refill:  2    Follow-up: No follow-ups on file.    [name], MD

## 2018-01-12 ENCOUNTER — Ambulatory Visit: Payer: Medicare Other | Admitting: Pharmacist

## 2018-01-12 DIAGNOSIS — I480 Paroxysmal atrial fibrillation: Secondary | ICD-10-CM

## 2018-01-12 DIAGNOSIS — E11319 Type 2 diabetes mellitus with unspecified diabetic retinopathy without macular edema: Secondary | ICD-10-CM | POA: Diagnosis not present

## 2018-01-12 DIAGNOSIS — I2510 Atherosclerotic heart disease of native coronary artery without angina pectoris: Secondary | ICD-10-CM | POA: Diagnosis not present

## 2018-01-12 DIAGNOSIS — E1140 Type 2 diabetes mellitus with diabetic neuropathy, unspecified: Secondary | ICD-10-CM | POA: Diagnosis not present

## 2018-01-12 DIAGNOSIS — N186 End stage renal disease: Secondary | ICD-10-CM | POA: Diagnosis not present

## 2018-01-12 DIAGNOSIS — Z7901 Long term (current) use of anticoagulants: Secondary | ICD-10-CM

## 2018-01-12 DIAGNOSIS — M159 Polyosteoarthritis, unspecified: Secondary | ICD-10-CM | POA: Diagnosis not present

## 2018-01-12 DIAGNOSIS — I5042 Chronic combined systolic (congestive) and diastolic (congestive) heart failure: Secondary | ICD-10-CM | POA: Diagnosis not present

## 2018-01-12 DIAGNOSIS — I132 Hypertensive heart and chronic kidney disease with heart failure and with stage 5 chronic kidney disease, or end stage renal disease: Secondary | ICD-10-CM | POA: Diagnosis not present

## 2018-01-12 DIAGNOSIS — M109 Gout, unspecified: Secondary | ICD-10-CM | POA: Diagnosis not present

## 2018-01-12 DIAGNOSIS — E1122 Type 2 diabetes mellitus with diabetic chronic kidney disease: Secondary | ICD-10-CM | POA: Diagnosis not present

## 2018-01-12 DIAGNOSIS — E1151 Type 2 diabetes mellitus with diabetic peripheral angiopathy without gangrene: Secondary | ICD-10-CM | POA: Diagnosis not present

## 2018-01-12 DIAGNOSIS — D631 Anemia in chronic kidney disease: Secondary | ICD-10-CM | POA: Diagnosis not present

## 2018-01-12 DIAGNOSIS — F329 Major depressive disorder, single episode, unspecified: Secondary | ICD-10-CM | POA: Diagnosis not present

## 2018-01-12 LAB — POCT INR: INR: 4.1 — AB (ref 2.0–3.0)

## 2018-01-12 NOTE — Patient Instructions (Signed)
Description   Skip dose today then continue same dose 1/2  tablet daily except 1 tablet on Mondays and Fridays. Discontinue Lovenox injection. Recheck in one week.

## 2018-01-13 DIAGNOSIS — N186 End stage renal disease: Secondary | ICD-10-CM | POA: Diagnosis not present

## 2018-01-13 DIAGNOSIS — Z4901 Encounter for fitting and adjustment of extracorporeal dialysis catheter: Secondary | ICD-10-CM | POA: Diagnosis not present

## 2018-01-13 DIAGNOSIS — Z23 Encounter for immunization: Secondary | ICD-10-CM | POA: Diagnosis not present

## 2018-01-13 DIAGNOSIS — D689 Coagulation defect, unspecified: Secondary | ICD-10-CM | POA: Diagnosis not present

## 2018-01-15 DIAGNOSIS — N186 End stage renal disease: Secondary | ICD-10-CM | POA: Diagnosis not present

## 2018-01-18 ENCOUNTER — Telehealth: Payer: Self-pay

## 2018-01-18 DIAGNOSIS — I482 Chronic atrial fibrillation, unspecified: Secondary | ICD-10-CM | POA: Diagnosis not present

## 2018-01-18 DIAGNOSIS — N2581 Secondary hyperparathyroidism of renal origin: Secondary | ICD-10-CM | POA: Diagnosis not present

## 2018-01-18 DIAGNOSIS — D631 Anemia in chronic kidney disease: Secondary | ICD-10-CM | POA: Diagnosis not present

## 2018-01-18 DIAGNOSIS — D689 Coagulation defect, unspecified: Secondary | ICD-10-CM | POA: Diagnosis not present

## 2018-01-18 DIAGNOSIS — D509 Iron deficiency anemia, unspecified: Secondary | ICD-10-CM | POA: Diagnosis not present

## 2018-01-18 DIAGNOSIS — N186 End stage renal disease: Secondary | ICD-10-CM | POA: Diagnosis not present

## 2018-01-18 DIAGNOSIS — Z4901 Encounter for fitting and adjustment of extracorporeal dialysis catheter: Secondary | ICD-10-CM | POA: Diagnosis not present

## 2018-01-18 DIAGNOSIS — Z23 Encounter for immunization: Secondary | ICD-10-CM | POA: Diagnosis not present

## 2018-01-18 NOTE — Telephone Encounter (Signed)
Called patient in an attempt to schedule 30 month f/u for the Stroke AF study. Patient stated she was in dialysis at this time and will have to return call to schedule at a later time.

## 2018-01-19 ENCOUNTER — Ambulatory Visit (INDEPENDENT_AMBULATORY_CARE_PROVIDER_SITE_OTHER): Payer: Medicare Other | Admitting: *Deleted

## 2018-01-19 DIAGNOSIS — I480 Paroxysmal atrial fibrillation: Secondary | ICD-10-CM

## 2018-01-19 DIAGNOSIS — Z7901 Long term (current) use of anticoagulants: Secondary | ICD-10-CM | POA: Diagnosis not present

## 2018-01-19 LAB — POCT INR: INR: 2.6 (ref 2.0–3.0)

## 2018-01-19 NOTE — Telephone Encounter (Signed)
Noted  

## 2018-01-19 NOTE — Telephone Encounter (Signed)
Christine Santos called nurse line stating she has not been out to see the patient. Christine Santos stated she has been sick and in the hospital and the orders were not generated correctly on their end. Christine Santos stated she is personally going out there today and will call if patients leg is in need of immediate attention by a physician.

## 2018-01-19 NOTE — Patient Instructions (Signed)
Description   Continue same dose 1/2  tablet daily except 1 tablet on Mondays and Fridays.. Recheck in 2 weeks.

## 2018-01-20 DIAGNOSIS — N186 End stage renal disease: Secondary | ICD-10-CM | POA: Diagnosis not present

## 2018-01-22 DIAGNOSIS — D689 Coagulation defect, unspecified: Secondary | ICD-10-CM | POA: Diagnosis not present

## 2018-01-22 DIAGNOSIS — D631 Anemia in chronic kidney disease: Secondary | ICD-10-CM | POA: Diagnosis not present

## 2018-01-22 DIAGNOSIS — N2581 Secondary hyperparathyroidism of renal origin: Secondary | ICD-10-CM | POA: Diagnosis not present

## 2018-01-22 DIAGNOSIS — N186 End stage renal disease: Secondary | ICD-10-CM | POA: Diagnosis not present

## 2018-01-22 DIAGNOSIS — Z23 Encounter for immunization: Secondary | ICD-10-CM | POA: Diagnosis not present

## 2018-01-22 DIAGNOSIS — Z4901 Encounter for fitting and adjustment of extracorporeal dialysis catheter: Secondary | ICD-10-CM | POA: Diagnosis not present

## 2018-01-22 DIAGNOSIS — I482 Chronic atrial fibrillation, unspecified: Secondary | ICD-10-CM | POA: Diagnosis not present

## 2018-01-22 DIAGNOSIS — D509 Iron deficiency anemia, unspecified: Secondary | ICD-10-CM | POA: Diagnosis not present

## 2018-01-23 DIAGNOSIS — N186 End stage renal disease: Secondary | ICD-10-CM | POA: Diagnosis not present

## 2018-01-23 DIAGNOSIS — Z992 Dependence on renal dialysis: Secondary | ICD-10-CM | POA: Diagnosis not present

## 2018-01-23 DIAGNOSIS — E1122 Type 2 diabetes mellitus with diabetic chronic kidney disease: Secondary | ICD-10-CM | POA: Diagnosis not present

## 2018-01-25 DIAGNOSIS — D631 Anemia in chronic kidney disease: Secondary | ICD-10-CM | POA: Diagnosis not present

## 2018-01-25 DIAGNOSIS — R52 Pain, unspecified: Secondary | ICD-10-CM | POA: Diagnosis not present

## 2018-01-25 DIAGNOSIS — N186 End stage renal disease: Secondary | ICD-10-CM | POA: Diagnosis not present

## 2018-01-25 DIAGNOSIS — E1122 Type 2 diabetes mellitus with diabetic chronic kidney disease: Secondary | ICD-10-CM | POA: Diagnosis not present

## 2018-01-25 DIAGNOSIS — Z4901 Encounter for fitting and adjustment of extracorporeal dialysis catheter: Secondary | ICD-10-CM | POA: Diagnosis not present

## 2018-01-25 DIAGNOSIS — I482 Chronic atrial fibrillation, unspecified: Secondary | ICD-10-CM | POA: Diagnosis not present

## 2018-01-25 DIAGNOSIS — D509 Iron deficiency anemia, unspecified: Secondary | ICD-10-CM | POA: Diagnosis not present

## 2018-01-25 DIAGNOSIS — D689 Coagulation defect, unspecified: Secondary | ICD-10-CM | POA: Diagnosis not present

## 2018-01-25 DIAGNOSIS — N2581 Secondary hyperparathyroidism of renal origin: Secondary | ICD-10-CM | POA: Diagnosis not present

## 2018-01-26 ENCOUNTER — Ambulatory Visit (INDEPENDENT_AMBULATORY_CARE_PROVIDER_SITE_OTHER): Payer: Medicare Other | Admitting: Vascular Surgery

## 2018-01-26 ENCOUNTER — Encounter: Payer: Self-pay | Admitting: Vascular Surgery

## 2018-01-26 ENCOUNTER — Other Ambulatory Visit: Payer: Self-pay

## 2018-01-26 ENCOUNTER — Telehealth: Payer: Self-pay

## 2018-01-26 ENCOUNTER — Ambulatory Visit (HOSPITAL_COMMUNITY)
Admission: RE | Admit: 2018-01-26 | Discharge: 2018-01-26 | Disposition: A | Discharge status: Home / Self Care | Payer: Medicare Other | Source: Ambulatory Visit | Attending: Family | Admitting: Family

## 2018-01-26 VITALS — BP 137/77 | HR 96 | Resp 14 | Ht 65.5 in | Wt 141.0 lb

## 2018-01-26 DIAGNOSIS — Z992 Dependence on renal dialysis: Secondary | ICD-10-CM | POA: Diagnosis not present

## 2018-01-26 DIAGNOSIS — Z48812 Encounter for surgical aftercare following surgery on the circulatory system: Secondary | ICD-10-CM

## 2018-01-26 DIAGNOSIS — N184 Chronic kidney disease, stage 4 (severe): Secondary | ICD-10-CM

## 2018-01-26 DIAGNOSIS — N186 End stage renal disease: Secondary | ICD-10-CM

## 2018-01-26 NOTE — Telephone Encounter (Signed)
Please thank her for the call. It is OK to discontinue Una boots for now--Ok to clean with saline and use calcium alginate w foam dressing.   Please make her an appointment to be seen here next week so we can assess her wounds and her cough. I do not think empirically treating a URI with azithromycin is a good idea THANKS! Dorcas Mcmurray

## 2018-01-26 NOTE — Progress Notes (Signed)
Patient ID: Christine Santos, female   DOB: 09-23-1945, 73 y.o.   MRN: [id number]  Reason for Consult: Routine Post Op   Referred by [name], MD  Subjective:     HPI:  Christine Santos is a 73 y.o. female previously underwent left to stage basilic vein fistula.  She is on dialysis via right IJ catheter.  Recent underwent drug-coated balloon angioplasty of the fistula.  There was a strong thrill at completion.  She now follows up with duplex for evaluation.  She has not yet used since intervention.  Past Medical History:  Diagnosis Date  . Anemia 08/29/2011  . Anxiety   . Arthritis    "legs" (01/22/2013)  . CAD (coronary artery disease)    s/p CABG in 1999  . Cataract   . Chronic kidney disease    Sees Dr [name]  . CLL (chronic lymphoblastic leukemia)    K[facility]  . Diastolic heart failure ([place])   . GERD (gastroesophageal reflux disease)   . Gout   . H/O hiatal hernia   . Heart murmur   . Hyperlipidemia   . Hypertension   . 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 PAD (peripheral artery disease) ([place])   . Paroxysmal atrial fibrillation ([facility])    a. identified on ILR as part of StrokeAF study  . Peripheral vascular disease ([place])   . Pneumonia 11/2016  . 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 Still has balance issues    Family History  Problem Relation Age of Onset  . Heart disease Mother   . Heart attack Mother   . Hypertension Mother   . Kidney disease Mother   . Pneumonia Father   . Heart attack Father   . Hyperlipidemia Father   . Diabetes Sister   . Breast cancer Sister   . Arthritis Brother   . Diabetes Brother   . Heart attack Sister   . Obesity Sister   . Hypertension Sister   . Heart attack Sister   . Cancer Sister        breast  . Hyperlipidemia Sister   . Hypertension Sister   . Hyperlipidemia Sister   . Breast cancer Maternal Grandmother    Past Surgical History:  Procedure  Laterality Date  . A/V FISTULAGRAM Left 12/25/2017   Procedure: A/V FISTULAGRAM;  Surgeon: Waynetta Sandy, MD;  Location: [facility];  Service: Cardiovascular;  Laterality: Left;  . A/V SHUNT INTERVENTION Left 12/25/2017   Procedure: A/V SHUNT INTERVENTION;  Surgeon: Waynetta Sandy, MD;  Location: [facility];  Service: Cardiovascular;  Laterality: Left;  . ANGIOPLASTY / STENTING FEMORAL Right 09/2010   SFA/notes 11/25/2010 (01/22/2013)  . ANGIOPLASTY / STENTING FEMORAL Left 11/2010   SFA/notes 11/25/2010 (01/22/2013)  . ANGIOPLASTY / STENTING ILIAC     [name] 11/25/2010 (01/22/2013)  . BASCILIC VEIN TRANSPOSITION Left 08/23/2017   Procedure: FIRST STAGE BASILIC VEIN TRANSPOSITION LEFT UPPER EXTREMITY;  Surgeon: [name], MD;  Location: [facility];  Service: Vascular;  Laterality: Left;  . BASCILIC VEIN TRANSPOSITION Left 10/23/2017   Procedure: SECOND STAGE BASILIC VEIN TRANSPOSITION LEFT ARM;  Surgeon: [name], MD;  Location: [facility];  Service: Vascular;  Laterality: Left;  . CATARACT EXTRACTION W/ INTRAOCULAR LENS  IMPLANT, BILATERAL Bilateral ?2011  . CHOLECYSTECTOMY  1993  . CORONARY ARTERY BYPASS GRAFT  10/1994   "CABG X3"  .  EP IMPLANTABLE DEVICE N/A 09/11/2015   Procedure: Loop Recorder Insertion;  Surgeon: Thompson Grayer, MD;  Location: [facility];  Service: Cardiovascular;  Laterality: N/A;  . HEEL SPUR EXCISION Bilateral 1970's  . IR FLUORO GUIDE CV LINE RIGHT  11/01/2017  . IR FLUORO GUIDE CV LINE RIGHT  11/05/2017  . IR US GUIDE VASC ACCESS RIGHT  11/01/2017  . IR US GUIDE VASC ACCESS RIGHT  11/05/2017  . LOWER EXTREMITY ANGIOGRAM N/A 01/22/2013   Procedure: LOWER EXTREMITY ANGIOGRAM;  Surgeon: [name], MD;  Location: [facility];  Service: Cardiovascular;  Laterality: N/A;  . SHOULDER OPEN ROTATOR CUFF REPAIR Bilateral 1990's   "2 times on 1 side; once on the other"  . TRANSLUMINAL ATHERECTOMY FEMORAL ARTERY Right  01/22/2013   & balloon  . TUBAL LIGATION  1980's  . VIDEO ASSISTED THORACOSCOPY (VATS)/EMPYEMA Left 11/29/2016   Procedure: VIDEO ASSISTED THORACOSCOPY (VATS)/EMPYEMA;  Surgeon: [name], MD;  Location: [facility] OR;  Service: Thoracic;  Laterality: Left;  VIDEO ASSISTED THORACOSCOPY (VATS)/EMPYEMA    Short Social History:  Social History   Tobacco Use  . Smoking status: Never Smoker  . Smokeless tobacco: Never Used  Substance Use Topics  . Alcohol use: No    Allergies  Allergen Reactions  . Cefepime Anaphylaxis    Throat swelling  . Other Other (See Comments)    Darvocet-rash   . Amoxicillin Rash    Has patient had a PCN reaction causing immediate rash, facial/tongue/throat swelling, SOB or lightheadedness with hypotension: No Has patient had a PCN reaction causing severe rash involving mucus membranes or skin necrosis: No Has patient had a PCN reaction that required hospitalization: No Has patient had a PCN reaction occurring within the last 10 years: No If all of the above answers are "NO", then may proceed with Cephalosporin use.   . Codeine Phosphate Rash  . Penicillins Rash    Has patient had a PCN reaction causing immediate rash, facial/tongue/throat swelling, SOB or lightheadedness with hypotension: No Has patient had a PCN reaction causing severe rash involving mucus membranes or skin necrosis: No Has patient had a PCN reaction that required hospitalization: No Has patient had a PCN reaction occurring within the last 10 years: No If all of the above answers are "NO", then may proceed with Cephalosporin use.      Current Outpatient Medications  Medication Sig Dispense Refill  . ACCU-CHEK AVIVA PLUS test strip TEST THREE TIMES A DAY 300 each 3  . allopurinol (ZYLOPRIM) 100 MG tablet Take 0.5 tablets (50 mg total) by mouth daily. 30 tablet 0  . Ascorbic Acid (VITAMIN C) 1000 MG tablet Take 1,000 mg by mouth daily.    . benzonatate (TESSALON) 100 MG capsule Take 1  capsule (100 mg total) by mouth 2 (two) times daily as needed for cough. 20 capsule 2  . carboxymethylcellulose (REFRESH PLUS) 0.5 % SOLN Place 1 drop into both eyes 3 (three) times daily.     . ferrous sulfate 325 (65 FE) MG tablet Take 1 tablet (325 mg total) by mouth daily with breakfast.  3  . GNP GARLIC EXTRACT PO Take 1 tablet by mouth daily.     Marland Kitchen HUMALOG KWIKPEN 100 UNIT/ML KiwkPen Inject 5 Units as directed 3 (three) times daily after meals.   3  . Insulin Glargine (LANTUS SOLOSTAR) 100 UNIT/ML Solostar Pen Inject 5 Units into the skin daily. 15 mL 12  . Melatonin 10 MG CAPS Take 10 mg  by mouth at bedtime.     . metoprolol tartrate (LOPRESSOR) 25 MG tablet Take 1 tablet (25 mg total) by mouth 2 (two) times daily. (Patient taking differently: Take 12.5 mg by mouth 2 (two) times daily. ) 30 tablet 0  . Multiple Vitamins-Minerals (EYE VITAMINS PO) Take 1 capsule by mouth daily. Taking one daily from her opthamologist (Dr. Herbert Deaner)     . Multiple Vitamins-Minerals (MULTIVITAMINS THER. W/MINERALS) TABS Take 1 tablet by mouth daily.      Marland Kitchen omega-3 acid ethyl esters (LOVAZA) 1 g capsule Take 2 g by mouth 2 (two) times daily.     . pantoprazole (PROTONIX) 40 MG tablet TAKE 1 TABLET BY MOUTH DAILY (Patient taking differently: Take 40 mg by mouth at bedtime. ) 90 tablet 3  . PARoxetine (PAXIL) 40 MG tablet Take 1 tablet (40 mg total) by mouth every morning. 90 tablet 3  . rosuvastatin (CRESTOR) 20 MG tablet Take 20 mg by mouth at bedtime.    . torsemide (DEMADEX) 100 MG tablet Take 1 tablet (100 mg total) by mouth daily.    . traMADol (ULTRAM) 50 MG tablet Take 1 tablet (50 mg total) by mouth every 6 (six) hours as needed (for pain.). 360 tablet 1  . warfarin (COUMADIN) 5 MG tablet Take 1/2 to 1 tablet daily as directed by Coumadin clinic 30 tablet 2  . enoxaparin (LOVENOX) 60 MG/0.6ML injection Inject 0.6 mLs (60 mg total) into the skin daily. (Patient not taking: Reported on 01/26/2018) 10 Syringe 1    . hydrALAZINE (APRESOLINE) 50 MG tablet TAKE 1 TABLET BY MOUTH THREE TIMES DAILY (Patient not taking: Reported on 01/26/2018) 270 tablet 3  . ondansetron (ZOFRAN) 4 MG tablet Take 1 tablet (4 mg total) by mouth every 8 (eight) hours as needed for nausea or vomiting. (Patient not taking: Reported on 01/26/2018) 20 tablet 1  . rosuvastatin (CRESTOR) 10 MG tablet Take 1 tablet (10 mg total) by mouth at bedtime. (Patient not taking: Reported on 01/26/2018) 30 tablet 0  . vancomycin (VANCOCIN) 250 MG capsule Take 1 capsule (250 mg total) by mouth 4 (four) times daily. (Patient not taking: Reported on 01/26/2018) 40 capsule 1   No current facility-administered medications for this visit.     Review of Systems  Constitutional: Positive for fatigue.  Respiratory: Positive for cough.  Cardiovascular: Cardiovascular negative.  GI: Gastrointestinal negative.  Skin: Skin negative.  Neurological: Neurological negative. Hematologic: Hematologic/lymphatic negative.  Psychiatric: Psychiatric negative.        Objective:  Objective   Vitals:   01/26/18 1350  BP: 137/77  Pulse: 96  Resp: 14  Weight: 141 lb (64 kg)  Height: 5' 5.5" (1.664 m)   Body mass index is 23.11 kg/m.  Physical Exam Constitutional:      Appearance: Normal appearance.  Eyes:     Pupils: Pupils are equal, round, and reactive to light.  Cardiovascular:     Rate and Rhythm: Normal rate.     Pulses:          Radial pulses are 0 on the left side.  Pulmonary:     Effort: Pulmonary effort is normal.  Musculoskeletal:        General: Swelling present.     Comments: Left upper arm has strong thrill pulsatility towards the axilla  Skin:    General: Skin is warm and dry.  Neurological:     General: No focal deficit present.     Mental Status: She is alert and  oriented to person, place, and time.  Psychiatric:        Mood and Affect: Mood normal.        Judgment: Judgment normal.     Data: I have independently interpreted  her dialysis duplex today which demonstrates diameter between 0.66 and 1.03 cm with a depth of 0.36 up to 1.1 cm and flow volume is 159 mL/min     Assessment/Plan:     73 year-old female follows up after endovascular invention of left arm AV fistula.  Now has a very strong thrill in size appears to be usable although the flow is quite low.  She is currently not feeling well with generalized fatigue.  I would not recommend any other procedures at this time and I will begin decannulated hopefully this will work.  If not she would likely just need conversion to a left upper arm AV graft.  I discussed this with her and her friend and they demonstrate good understanding and she can follow-up on an as-needed basis     Waynetta Sandy MD [facility]

## 2018-01-26 NOTE — Telephone Encounter (Signed)
Ronny Bacon, RN with [facility], called nurse line requesting VO to stop uno boots, due to open wound on right leg. Ronny Bacon stated she consulted with their wound care nurse, who suggested stopping the uno boots and having patient see vascular.   Ronny Bacon would like to clean wound with normal saline and calcium alginate wrapped with a foam dressing.   Ronny Bacon also is requesting a zpack to be sent in for patient for greenish mucous coming up with cough. Ronny Bacon stated lungs are clear, just thinks an upper respiratory infection.   Please call VO to all above to [phone].

## 2018-01-26 NOTE — Telephone Encounter (Signed)
Contacted Natasha with [facility] and gave her the verbal orders she requested,  and then contacted pt to see about getting her in to clinic next week and she said that dialysis had sent her in some doxycycline and that she contacted the coumadin clinic about this and she had started taking that.  I told her that if she is not feeling better by Monday she should give [place] a call and we would get her in next week to be evaluated.  Otherwise we would see her at her appointment scheduled on 02/05/2018 with Dr. Andria Frames.  Pt understood and agreed to the plan.  Katharina Caper, April D, [place]

## 2018-01-27 DIAGNOSIS — D509 Iron deficiency anemia, unspecified: Secondary | ICD-10-CM | POA: Diagnosis not present

## 2018-01-27 DIAGNOSIS — D689 Coagulation defect, unspecified: Secondary | ICD-10-CM | POA: Diagnosis not present

## 2018-01-27 DIAGNOSIS — Z4901 Encounter for fitting and adjustment of extracorporeal dialysis catheter: Secondary | ICD-10-CM | POA: Diagnosis not present

## 2018-01-27 DIAGNOSIS — I482 Chronic atrial fibrillation, unspecified: Secondary | ICD-10-CM | POA: Diagnosis not present

## 2018-01-27 DIAGNOSIS — D631 Anemia in chronic kidney disease: Secondary | ICD-10-CM | POA: Diagnosis not present

## 2018-01-27 DIAGNOSIS — R52 Pain, unspecified: Secondary | ICD-10-CM | POA: Diagnosis not present

## 2018-01-27 DIAGNOSIS — N2581 Secondary hyperparathyroidism of renal origin: Secondary | ICD-10-CM | POA: Diagnosis not present

## 2018-01-27 DIAGNOSIS — N186 End stage renal disease: Secondary | ICD-10-CM | POA: Diagnosis not present

## 2018-01-27 DIAGNOSIS — E1122 Type 2 diabetes mellitus with diabetic chronic kidney disease: Secondary | ICD-10-CM | POA: Diagnosis not present

## 2018-01-29 NOTE — Telephone Encounter (Signed)
Noted and agree. 

## 2018-01-30 ENCOUNTER — Telehealth: Payer: Self-pay | Admitting: Licensed Clinical Social Worker

## 2018-01-30 DIAGNOSIS — Z4901 Encounter for fitting and adjustment of extracorporeal dialysis catheter: Secondary | ICD-10-CM | POA: Diagnosis not present

## 2018-01-30 DIAGNOSIS — D631 Anemia in chronic kidney disease: Secondary | ICD-10-CM | POA: Diagnosis not present

## 2018-01-30 DIAGNOSIS — N186 End stage renal disease: Secondary | ICD-10-CM | POA: Diagnosis not present

## 2018-01-30 DIAGNOSIS — D689 Coagulation defect, unspecified: Secondary | ICD-10-CM | POA: Diagnosis not present

## 2018-01-30 DIAGNOSIS — E1122 Type 2 diabetes mellitus with diabetic chronic kidney disease: Secondary | ICD-10-CM | POA: Diagnosis not present

## 2018-01-30 DIAGNOSIS — R52 Pain, unspecified: Secondary | ICD-10-CM | POA: Diagnosis not present

## 2018-01-30 DIAGNOSIS — I482 Chronic atrial fibrillation, unspecified: Secondary | ICD-10-CM | POA: Diagnosis not present

## 2018-01-30 DIAGNOSIS — N2581 Secondary hyperparathyroidism of renal origin: Secondary | ICD-10-CM | POA: Diagnosis not present

## 2018-01-30 DIAGNOSIS — D509 Iron deficiency anemia, unspecified: Secondary | ICD-10-CM | POA: Diagnosis not present
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
## 2018-01-31 ENCOUNTER — Ambulatory Visit (INDEPENDENT_AMBULATORY_CARE_PROVIDER_SITE_OTHER): Payer: Medicare Other | Admitting: *Deleted

## 2018-01-31 DIAGNOSIS — I480 Paroxysmal atrial fibrillation: Secondary | ICD-10-CM | POA: Diagnosis not present

## 2018-01-31 DIAGNOSIS — Z7901 Long term (current) use of anticoagulants: Secondary | ICD-10-CM

## 2018-01-31 LAB — POCT INR: INR: 3.5 — AB (ref 2.0–3.0)

## 2018-01-31 NOTE — Patient Instructions (Signed)
Description   Do not take any Coumadin tonight then continue same dose 1/2 tablet daily except 1 tablet on Mondays and Fridays. Recheck in 2 weeks. Call us with any medication changes or concerns [phone].

## 2018-02-01 DIAGNOSIS — I482 Chronic atrial fibrillation, unspecified: Secondary | ICD-10-CM | POA: Diagnosis not present

## 2018-02-01 DIAGNOSIS — R52 Pain, unspecified: Secondary | ICD-10-CM | POA: Diagnosis not present

## 2018-02-01 DIAGNOSIS — N2581 Secondary hyperparathyroidism of renal origin: Secondary | ICD-10-CM | POA: Diagnosis not present

## 2018-02-01 DIAGNOSIS — N186 End stage renal disease: Secondary | ICD-10-CM | POA: Diagnosis not present

## 2018-02-01 DIAGNOSIS — Z4901 Encounter for fitting and adjustment of extracorporeal dialysis catheter: Secondary | ICD-10-CM | POA: Diagnosis not present

## 2018-02-01 DIAGNOSIS — D509 Iron deficiency anemia, unspecified: Secondary | ICD-10-CM | POA: Diagnosis not present

## 2018-02-01 DIAGNOSIS — E1122 Type 2 diabetes mellitus with diabetic chronic kidney disease: Secondary | ICD-10-CM | POA: Diagnosis not present

## 2018-02-01 DIAGNOSIS — D631 Anemia in chronic kidney disease: Secondary | ICD-10-CM | POA: Diagnosis not present

## 2018-02-01 DIAGNOSIS — D689 Coagulation defect, unspecified: Secondary | ICD-10-CM | POA: Diagnosis not present

## 2018-02-03 ENCOUNTER — Encounter: Payer: Self-pay | Admitting: Podiatry

## 2018-02-03 DIAGNOSIS — N186 End stage renal disease: Secondary | ICD-10-CM | POA: Diagnosis not present

## 2018-02-03 DIAGNOSIS — D631 Anemia in chronic kidney disease: Secondary | ICD-10-CM | POA: Diagnosis not present

## 2018-02-03 DIAGNOSIS — R52 Pain, unspecified: Secondary | ICD-10-CM | POA: Diagnosis not present

## 2018-02-03 DIAGNOSIS — D689 Coagulation defect, unspecified: Secondary | ICD-10-CM | POA: Diagnosis not present

## 2018-02-03 DIAGNOSIS — Z4901 Encounter for fitting and adjustment of extracorporeal dialysis catheter: Secondary | ICD-10-CM | POA: Diagnosis not present

## 2018-02-03 DIAGNOSIS — N2581 Secondary hyperparathyroidism of renal origin: Secondary | ICD-10-CM | POA: Diagnosis not present

## 2018-02-03 DIAGNOSIS — D509 Iron deficiency anemia, unspecified: Secondary | ICD-10-CM | POA: Diagnosis not present

## 2018-02-03 DIAGNOSIS — I482 Chronic atrial fibrillation, unspecified: Secondary | ICD-10-CM | POA: Diagnosis not present

## 2018-02-03 DIAGNOSIS — E1122 Type 2 diabetes mellitus with diabetic chronic kidney disease: Secondary | ICD-10-CM | POA: Diagnosis not present

## 2018-02-03 NOTE — Progress Notes (Signed)
Subjective: Christine Santos presents today with diabetes, diabetic neuropathy and PAD with cc of painful, discolored, thick toenails which interfere with daily activities. Pain is aggravated when wearing enclosed shoe gear. Pain is getting progressively worse and relieved with periodic professional debridement.   She remains on Coumadin.   She is on hemodialysis on TTS.  [name], MD is her PCP and last visit was 10/18/2017.   Allergies  Allergen Reactions  . Cefepime Anaphylaxis    Throat swelling  . Other Other (See Comments)    Darvocet-rash   . Amoxicillin Rash    Has patient had a PCN reaction causing immediate rash, facial/tongue/throat swelling, SOB or lightheadedness with hypotension: No Has patient had a PCN reaction causing severe rash involving mucus membranes or skin necrosis: No Has patient had a PCN reaction that required hospitalization: No Has patient had a PCN reaction occurring within the last 10 years: No If all of the above answers are "NO", then may proceed with Cephalosporin use.   . Codeine Phosphate Rash  . Penicillins Rash    Has patient had a PCN reaction causing immediate rash, facial/tongue/throat swelling, SOB or lightheadedness with hypotension: No Has patient had a PCN reaction causing severe rash involving mucus membranes or skin necrosis: No Has patient had a PCN reaction that required hospitalization: No Has patient had a PCN reaction occurring within the last 10 years: No If all of the above answers are "NO", then may proceed with Cephalosporin use.      Objective: Vascular Examination: Capillary refill time <3 seconds x 10 digits Dorsalis pedis pulses 1/4 b/l Posterior tibial pulses absent b/l No digital hair x 10 digits Skin temperature warm to cool b/l +2 edema RLE  Dermatological Examination: Skin dry b/l  No open wounds.   Toenails 1-5 b/l discolored, thick, dystrophic with subungual debris and pain with palpation to  nailbeds due to thickness of nails.   Musculoskeletal: Muscle strength 5/5 to all LE muscle groups  No pain with calf compression b/l  HAV with bunion b/l  Hammertoe deformity b/l 2nd digits  Neurological: Sensation diminished with 10 gram monofilament. Vibratory sensation diminished  Assessment: 1. Painful onychomycosis toenails 1-5 b/l 2. NIDDM with Peripheral arterial disease 3. Diabetic neuropathy  Plan: 1. Continue diabetic foot care principles. Literature dispensed. 2. Qualifying diagnoses for diabetic shoes: NIDDM with PAD, diabetic neuropathy, HAV with bunion, Hammertoes b/l 2nd digits 3. For dry skin, recommended [place] Diabetic Foot Cream 4. Toenails 1-5 b/l were debrided in length and girth without iatrogenic bleeding. 5. Advised her to speak with her Cardiologist/PCP regarding edema  6. Patient to continue soft, supportive shoe gear 7. Patient to report any pedal injuries to medical professional  8. Follow up 3 months. Patient/POA to call should there be a concern in the interim.

## 2018-02-05 ENCOUNTER — Other Ambulatory Visit: Payer: Self-pay

## 2018-02-05 ENCOUNTER — Encounter: Payer: Self-pay | Admitting: Family Medicine

## 2018-02-05 ENCOUNTER — Ambulatory Visit (INDEPENDENT_AMBULATORY_CARE_PROVIDER_SITE_OTHER): Payer: Medicare Other | Admitting: Family Medicine

## 2018-02-05 DIAGNOSIS — Z992 Dependence on renal dialysis: Secondary | ICD-10-CM

## 2018-02-05 DIAGNOSIS — N186 End stage renal disease: Secondary | ICD-10-CM | POA: Diagnosis not present

## 2018-02-05 DIAGNOSIS — I10 Essential (primary) hypertension: Secondary | ICD-10-CM | POA: Diagnosis not present

## 2018-02-05 DIAGNOSIS — I480 Paroxysmal atrial fibrillation: Secondary | ICD-10-CM | POA: Diagnosis not present

## 2018-02-05 DIAGNOSIS — I5022 Chronic systolic (congestive) heart failure: Secondary | ICD-10-CM | POA: Diagnosis not present

## 2018-02-05 DIAGNOSIS — Z609 Problem related to social environment, unspecified: Secondary | ICD-10-CM

## 2018-02-05 MED ORDER — METOPROLOL SUCCINATE ER 25 MG PO TB24: 25.0000 mg | ORAL_TABLET | Freq: Every day | ORAL | 3 refills | Status: DC

## 2018-02-05 NOTE — Patient Instructions (Signed)
I hope that helps your hearing. I am glad that you are getting stronger. Please be honest with yourself about needing help.  Whether driving or help with housework or even changing your living situation. See me in 1-2 months.

## 2018-02-05 NOTE — Assessment & Plan Note (Signed)
BP lowish today but no complains of lightheadedness.  No change.

## 2018-02-05 NOTE — Progress Notes (Signed)
Established Patient Office Visit  Subjective:  Patient ID: Christine Santos, female    DOB: February 25, 1945  Age: 73 y.o. MRN: [id number]  CC:  Chief Complaint  Patient presents with  . 1 month follow up    HPI [name] presents for FU multiple issues 1. Generalized weakness, difficulties with ADLs and iADLs  She is slowly improving and continues to work with therapy.  No where near her pre Oct 2019 level of function. 2. ESRD dialysis.  Doing well.  Feels drained after dialysis.  Still with leg swelling.  Skin breakdown on right leg is improving.   3 Left ear fullness.   Past Medical History:  Diagnosis Date  . Anemia 08/29/2011  . Anxiety   . Arthritis    "legs" (01/22/2013)  . CAD (coronary artery disease)    s/p CABG in 1999  . Cataract   . Chronic kidney disease    Sees Dr [name]  . CLL (chronic lymphoblastic leukemia)    Kidney  . Diastolic heart failure ([place])   . GERD (gastroesophageal reflux disease)   . Gout   . H/O hiatal hernia   . Heart murmur   . Hyperlipidemia   . Hypertension   . IDDM (insulin dependent diabetes mellitus) ([place])    Type II  . PAD (peripheral artery disease) ([place])   . Paroxysmal atrial fibrillation (HCC)    a. identified on ILR as part of StrokeAF study  . Peripheral vascular disease ([place])   . Pneumonia 11/2016  . 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 Still has balance issues     Past Surgical History:  Procedure Laterality Date  . A/V FISTULAGRAM Left 12/25/2017   Procedure: A/V FISTULAGRAM;  Surgeon: Waynetta Sandy, MD;  Location: [facility];  Service: Cardiovascular;  Laterality: Left;  . A/V SHUNT INTERVENTION Left 12/25/2017   Procedure: A/V SHUNT INTERVENTION;  Surgeon: Waynetta Sandy, MD;  Location: [facility] [facility];  Service: Cardiovascular;  Laterality: Left;  . ANGIOPLASTY / STENTING FEMORAL Right 09/2010   SFA/notes 11/25/2010 (01/22/2013)  . ANGIOPLASTY /  STENTING FEMORAL Left 11/2010   SFA/notes 11/25/2010 (01/22/2013)  . ANGIOPLASTY / STENTING ILIAC     [name] 11/25/2010 (01/22/2013)  . BASCILIC VEIN TRANSPOSITION Left 08/23/2017   Procedure: FIRST STAGE BASILIC VEIN TRANSPOSITION LEFT UPPER EXTREMITY;  Surgeon: [name], MD;  Location: [facility];  Service: Vascular;  Laterality: Left;  . BASCILIC VEIN TRANSPOSITION Left 10/23/2017   Procedure: SECOND STAGE BASILIC VEIN TRANSPOSITION LEFT ARM;  Surgeon: [name], MD;  Location: [facility];  Service: Vascular;  Laterality: Left;  . CATARACT EXTRACTION W/ INTRAOCULAR LENS  IMPLANT, BILATERAL Bilateral ?2011  . CHOLECYSTECTOMY  1993  . CORONARY ARTERY BYPASS GRAFT  10/1994   "CABG X3"  . EP IMPLANTABLE DEVICE N/A 09/11/2015   Procedure: Loop Recorder Insertion;  Surgeon: Thompson Grayer, MD;  Location: [facility];  Service: Cardiovascular;  Laterality: N/A;  . HEEL SPUR EXCISION Bilateral 1970's  . IR FLUORO GUIDE CV LINE RIGHT  11/01/2017  . IR FLUORO GUIDE CV LINE RIGHT  11/05/2017  . IR US GUIDE VASC ACCESS RIGHT  11/01/2017  . IR US GUIDE VASC ACCESS RIGHT  11/05/2017  . LOWER EXTREMITY ANGIOGRAM N/A 01/22/2013   Procedure: LOWER EXTREMITY ANGIOGRAM;  Surgeon: [name], MD;  Location: [facility] CATH [facility];  Service: Cardiovascular;  Laterality: N/A;  . SHOULDER OPEN ROTATOR  CUFF REPAIR Bilateral 1990's   "2 times on 1 side; once on the other"  . TRANSLUMINAL ATHERECTOMY FEMORAL ARTERY Right 01/22/2013   & balloon  . TUBAL LIGATION  1980's  . VIDEO ASSISTED THORACOSCOPY (VATS)/EMPYEMA Left 11/29/2016   Procedure: VIDEO ASSISTED THORACOSCOPY (VATS)/EMPYEMA;  Surgeon: [name], MD;  Location: [facility] OR;  Service: Thoracic;  Laterality: Left;  VIDEO ASSISTED THORACOSCOPY (VATS)/EMPYEMA    Family History  Problem Relation Age of Onset  . Heart disease Mother   . Heart attack Mother   . Hypertension Mother   . Kidney disease Mother   . Pneumonia Father   . Heart attack  Father   . Hyperlipidemia Father   . Diabetes Sister   . Breast cancer Sister   . Arthritis Brother   . Diabetes Brother   . Heart attack Sister   . Obesity Sister   . Hypertension Sister   . Heart attack Sister   . Cancer Sister        breast  . Hyperlipidemia Sister   . Hypertension Sister   . Hyperlipidemia Sister   . Breast cancer Maternal Grandmother     Social History   Socioeconomic History  . Marital status: Divorced    Spouse name: Not on file  . Number of children: 2  . Years of education: 21  . Highest education level: Not on file  Occupational History  . Occupation: DISABILITY    Employer: OTHER    Comment: Worked at [employer]  . Financial resource strain: Not on file  . Food insecurity:    Worry: Not on file    Inability: Not on file  . Transportation needs:    Medical: Not on file    Non-medical: Not on file  Tobacco Use  . Smoking status: Never Smoker  . Smokeless tobacco: Never Used  Substance and Sexual Activity  . Alcohol use: No  . Drug use: No  . Sexual activity: Not Currently  Lifestyle  . Physical activity:    Days per week: Not on file    Minutes per session: Not on file  . Stress: Not on file  Relationships  . Social connections:    Talks on phone: Not on file    Gets together: Not on file    Attends religious service: Not on file    Active member of club or organization: Not on file    Attends meetings of clubs or organizations: Not on file    Relationship status: Not on file  . Intimate partner violence:    Fear of current or ex partner: Not on file    Emotionally abused: Not on file    Physically abused: Not on file    Forced sexual activity: Not on file  Other Topics Concern  . Not on file  Social History Narrative   Health Care POA:    Emergency Contact: sister, [name] (h) [phone]   End of Life Plan:    Who lives with you: female friend   Any pets: none   Diet: Pt has a varied diet of protein,  starch, vegetables   Exercise: Pt does not have any regular exercise routine.   Seatbelts: Pt reports wearing seatbelt when in vehicles.    Sun Exposure/Protection: Pt reports using sun screen.   Hobbies: going to lake, biking, blue grass music      Current Social History 08/16/2016        Patient lives with Brooten  Davis (Significant other) in one level home 08/16/2016   Transportation: Patient has own vehicle 08/16/2016   Important Relationships [name] and sisters, [name] and Vaughan Basta 08/16/2016    Pets: None 08/16/2016   Education / Work:  12 th grade/ Disabled ([employer]) 08/16/2016   Interests / Fun: Going to [facility], out to eat with friends 08/16/2016   Current Stressors: Bilat foot pain (neuropathy) 08/16/2016   Religious / Personal Beliefs: Raised Methodist, goes to [employer] 08/16/2016   L. Ducatte, RN, BSN                                                                                                            Outpatient Medications Prior to Visit  Medication Sig Dispense Refill  . ACCU-CHEK AVIVA PLUS test strip TEST THREE TIMES A DAY 300 each 3  . allopurinol (ZYLOPRIM) 100 MG tablet Take 0.5 tablets (50 mg total) by mouth daily. 30 tablet 0  . Ascorbic Acid (VITAMIN C) 1000 MG tablet Take 1,000 mg by mouth daily.    . benzonatate (TESSALON) 100 MG capsule Take 1 capsule (100 mg total) by mouth 2 (two) times daily as needed for cough. 20 capsule 2  . carboxymethylcellulose (REFRESH PLUS) 0.5 % SOLN Place 1 drop into both eyes 3 (three) times daily.     Marland Kitchen enoxaparin (LOVENOX) 60 MG/0.6ML injection Inject 0.6 mLs (60 mg total) into the skin daily. (Patient not taking: Reported on 01/26/2018) 10 Syringe 1  . ferrous sulfate 325 (65 FE) MG tablet Take 1 tablet (325 mg total) by mouth daily with breakfast.  3  . GNP GARLIC EXTRACT PO Take 1 tablet by mouth daily.     Marland Kitchen HUMALOG KWIKPEN 100 UNIT/ML KiwkPen Inject 5 Units as directed 3 (three) times daily after meals.   3  .  hydrALAZINE (APRESOLINE) 50 MG tablet TAKE 1 TABLET BY MOUTH THREE TIMES DAILY (Patient not taking: Reported on 01/26/2018) 270 tablet 3  . Insulin Glargine (LANTUS SOLOSTAR) 100 UNIT/ML Solostar Pen Inject 5 Units into the skin daily. 15 mL 12  . Melatonin 10 MG CAPS Take 10 mg by mouth at bedtime.     . Multiple Vitamins-Minerals (EYE VITAMINS PO) Take 1 capsule by mouth daily. Taking one daily from her opthamologist (Dr. Herbert Deaner)     . Multiple Vitamins-Minerals (MULTIVITAMINS THER. W/MINERALS) TABS Take 1 tablet by mouth daily.      Marland Kitchen omega-3 acid ethyl esters (LOVAZA) 1 g capsule Take 2 g by mouth 2 (two) times daily.     . ondansetron (ZOFRAN) 4 MG tablet Take 1 tablet (4 mg total) by mouth every 8 (eight) hours as needed for nausea or vomiting. (Patient not taking: Reported on 01/26/2018) 20 tablet 1  . pantoprazole (PROTONIX) 40 MG tablet TAKE 1 TABLET BY MOUTH DAILY (Patient taking differently: Take 40 mg by mouth at bedtime. ) 90 tablet 3  . PARoxetine (PAXIL) 40 MG tablet Take 1 tablet (40 mg total) by mouth every morning. 90 tablet 3  . rosuvastatin (CRESTOR) 10  MG tablet Take 1 tablet (10 mg total) by mouth at bedtime. (Patient not taking: Reported on 01/26/2018) 30 tablet 0  . rosuvastatin (CRESTOR) 20 MG tablet Take 20 mg by mouth at bedtime.    . torsemide (DEMADEX) 100 MG tablet Take 1 tablet (100 mg total) by mouth daily.    . traMADol (ULTRAM) 50 MG tablet Take 1 tablet (50 mg total) by mouth every 6 (six) hours as needed (for pain.). 360 tablet 1  . vancomycin (VANCOCIN) 250 MG capsule Take 1 capsule (250 mg total) by mouth 4 (four) times daily. (Patient not taking: Reported on 01/26/2018) 40 capsule 1  . warfarin (COUMADIN) 5 MG tablet Take 1/2 to 1 tablet daily as directed by Coumadin clinic 30 tablet 2  . metoprolol tartrate (LOPRESSOR) 25 MG tablet Take 1 tablet (25 mg total) by mouth 2 (two) times daily. (Patient taking differently: Take 12.5 mg by mouth 2 (two) times daily. ) 30  tablet 0   No facility-administered medications prior to visit.     Allergies  Allergen Reactions  . Cefepime Anaphylaxis    Throat swelling  . Other Other (See Comments)    Darvocet-rash   . Amoxicillin Rash    Has patient had a PCN reaction causing immediate rash, facial/tongue/throat swelling, SOB or lightheadedness with hypotension: No Has patient had a PCN reaction causing severe rash involving mucus membranes or skin necrosis: No Has patient had a PCN reaction that required hospitalization: No Has patient had a PCN reaction occurring within the last 10 years: No If all of the above answers are "NO", then may proceed with Cephalosporin use.   . Codeine Phosphate Rash  . Penicillins Rash    Has patient had a PCN reaction causing immediate rash, facial/tongue/throat swelling, SOB or lightheadedness with hypotension: No Has patient had a PCN reaction causing severe rash involving mucus membranes or skin necrosis: No Has patient had a PCN reaction that required hospitalization: No Has patient had a PCN reaction occurring within the last 10 years: No If all of the above answers are "NO", then may proceed with Cephalosporin use.      ROS Review of Systems    Objective:    Physical Exam  BP 122/72   Pulse (!) 105   Temp 97.7 F (36.5 C) (Oral)   Ht _0  (1.676 m)   Wt 147 lb 6.4 oz (66.9 kg)   PF 96 L/min   BMI 23.79 kg/m  Wt Readings from Last 3 Encounters:  02/05/18 147 lb 6.4 oz (66.9 kg)  01/26/18 141 lb (64 kg)  01/10/18 147 lb (66.7 kg)  Cerumen impaction, irrigated clear. With improved subjective hearing. Lungs clear Cardiac RRR without m or g abd benign Ext 2-3+ edema bilaterally.    Health Maintenance Due  Topic Date Due  . OPHTHALMOLOGY EXAM  01/09/2018    There are no preventive care reminders to display for this patient.  Lab Results  Component Value Date   TSH 3.397 09/04/2015   Lab Results  Component Value Date   WBC 13.8 (H)  01/10/2018   HGB 10.5 (L) 01/10/2018   HCT 35.1 01/10/2018   MCV 99 (H) 01/10/2018   PLT 181 01/10/2018   Lab Results  Component Value Date   NA 138 01/10/2018   K 5.6 (H) 01/10/2018   CHLORIDE 104 09/06/2016   CO2 29 01/10/2018   GLUCOSE 300 (H) 01/10/2018   BUN 33 (H) 01/10/2018   CREATININE 3.90 (H) 01/10/2018  BILITOT 0.5 01/10/2018   ALKPHOS 183 (H) 01/10/2018   AST 40 01/10/2018   ALT 45 (H) 01/10/2018   PROT 5.0 (L) 01/10/2018   ALBUMIN 3.4 (L) 01/10/2018   CALCIUM 8.7 01/10/2018   ANIONGAP 10 12/20/2017   EGFR 13 (L) 09/06/2016   GFR 27.44 (L) 02/08/2013   Lab Results  Component Value Date   CHOL 169 09/04/2015   Lab Results  Component Value Date   HDL 32 (L) 09/04/2015   Lab Results  Component Value Date   LDLCALC 99 09/04/2015   Lab Results  Component Value Date   TRIG 188 (H) 09/04/2015   Lab Results  Component Value Date   CHOLHDL 5.3 09/04/2015   Lab Results  Component Value Date   HGBA1C 7.0 (H) 10/28/2017      Assessment & Plan:   Problem List Items Addressed This Visit    Paroxysmal atrial fibrillation (HCC)   Relevant Medications   metoprolol succinate (TOPROL-XL) 25 MG 24 hr tablet      Meds ordered this encounter  Medications  . metoprolol succinate (TOPROL-XL) 25 MG 24 hr tablet    Sig: Take 1 tablet (25 mg total) by mouth daily.    Dispense:  90 tablet    Refill:  3    Follow-up: No follow-ups on file.    [name], MD

## 2018-02-05 NOTE — Assessment & Plan Note (Signed)
Switch from short to long acting metoprolol due to proven mortality benefit/core measure.

## 2018-02-05 NOTE — Assessment & Plan Note (Signed)
Doing OK.  They have not been able to rid her of her peripheral edema

## 2018-02-05 NOTE — Assessment & Plan Note (Signed)
Spouse is blind.  They are doing OK right now.  They are teetering on the edge of loss of independence.  Informed they should have a backup plan.

## 2018-02-05 NOTE — Assessment & Plan Note (Signed)
Rate controled and in sinus rhythm

## 2018-02-06 ENCOUNTER — Telehealth: Payer: Self-pay | Admitting: *Deleted

## 2018-02-06 DIAGNOSIS — D509 Iron deficiency anemia, unspecified: Secondary | ICD-10-CM | POA: Diagnosis not present

## 2018-02-06 DIAGNOSIS — N2581 Secondary hyperparathyroidism of renal origin: Secondary | ICD-10-CM | POA: Diagnosis not present

## 2018-02-06 DIAGNOSIS — D689 Coagulation defect, unspecified: Secondary | ICD-10-CM | POA: Diagnosis not present

## 2018-02-06 DIAGNOSIS — I482 Chronic atrial fibrillation, unspecified: Secondary | ICD-10-CM | POA: Diagnosis not present

## 2018-02-06 DIAGNOSIS — R52 Pain, unspecified: Secondary | ICD-10-CM | POA: Diagnosis not present

## 2018-02-06 DIAGNOSIS — Z4901 Encounter for fitting and adjustment of extracorporeal dialysis catheter: Secondary | ICD-10-CM | POA: Diagnosis not present

## 2018-02-06 DIAGNOSIS — N186 End stage renal disease: Secondary | ICD-10-CM | POA: Diagnosis not present

## 2018-02-06 DIAGNOSIS — E1122 Type 2 diabetes mellitus with diabetic chronic kidney disease: Secondary | ICD-10-CM | POA: Diagnosis not present

## 2018-02-06 DIAGNOSIS — D631 Anemia in chronic kidney disease: Secondary | ICD-10-CM | POA: Diagnosis not present

## 2018-02-06 NOTE — Telephone Encounter (Signed)
Verbal orders given as requested. 

## 2018-02-06 NOTE — Telephone Encounter (Signed)
Manuela Schwartz calling for OT verbal orders as follows:  1 time(s) weekly for 3 week(s)  You can leave verbal orders on confidential voicemail.  Tifini Reeder, Salome Spotted, CMA

## 2018-02-08 DIAGNOSIS — Z4901 Encounter for fitting and adjustment of extracorporeal dialysis catheter: Secondary | ICD-10-CM | POA: Diagnosis not present

## 2018-02-08 DIAGNOSIS — N2581 Secondary hyperparathyroidism of renal origin: Secondary | ICD-10-CM | POA: Diagnosis not present

## 2018-02-08 DIAGNOSIS — R52 Pain, unspecified: Secondary | ICD-10-CM | POA: Diagnosis not present

## 2018-02-08 DIAGNOSIS — D689 Coagulation defect, unspecified: Secondary | ICD-10-CM | POA: Diagnosis not present

## 2018-02-08 DIAGNOSIS — D631 Anemia in chronic kidney disease: Secondary | ICD-10-CM | POA: Diagnosis not present

## 2018-02-08 DIAGNOSIS — D509 Iron deficiency anemia, unspecified: Secondary | ICD-10-CM | POA: Diagnosis not present

## 2018-02-08 DIAGNOSIS — I482 Chronic atrial fibrillation, unspecified: Secondary | ICD-10-CM | POA: Diagnosis not present

## 2018-02-08 DIAGNOSIS — N186 End stage renal disease: Secondary | ICD-10-CM | POA: Diagnosis not present

## 2018-02-08 DIAGNOSIS — E1122 Type 2 diabetes mellitus with diabetic chronic kidney disease: Secondary | ICD-10-CM | POA: Diagnosis not present

## 2018-02-10 DIAGNOSIS — N2581 Secondary hyperparathyroidism of renal origin: Secondary | ICD-10-CM | POA: Diagnosis not present

## 2018-02-10 DIAGNOSIS — N186 End stage renal disease: Secondary | ICD-10-CM | POA: Diagnosis not present

## 2018-02-10 DIAGNOSIS — E1122 Type 2 diabetes mellitus with diabetic chronic kidney disease: Secondary | ICD-10-CM | POA: Diagnosis not present

## 2018-02-10 DIAGNOSIS — D631 Anemia in chronic kidney disease: Secondary | ICD-10-CM | POA: Diagnosis not present

## 2018-02-10 DIAGNOSIS — I482 Chronic atrial fibrillation, unspecified: Secondary | ICD-10-CM | POA: Diagnosis not present

## 2018-02-10 DIAGNOSIS — D509 Iron deficiency anemia, unspecified: Secondary | ICD-10-CM | POA: Diagnosis not present

## 2018-02-10 DIAGNOSIS — D689 Coagulation defect, unspecified: Secondary | ICD-10-CM | POA: Diagnosis not present

## 2018-02-10 DIAGNOSIS — R52 Pain, unspecified: Secondary | ICD-10-CM | POA: Diagnosis not present

## 2018-02-10 DIAGNOSIS — Z4901 Encounter for fitting and adjustment of extracorporeal dialysis catheter: Secondary | ICD-10-CM | POA: Diagnosis not present

## 2018-02-12 ENCOUNTER — Ambulatory Visit (INDEPENDENT_AMBULATORY_CARE_PROVIDER_SITE_OTHER): Payer: Medicare Other | Admitting: Orthotics

## 2018-02-12 DIAGNOSIS — B351 Tinea unguium: Secondary | ICD-10-CM

## 2018-02-12 DIAGNOSIS — E1142 Type 2 diabetes mellitus with diabetic polyneuropathy: Secondary | ICD-10-CM | POA: Diagnosis not present

## 2018-02-12 DIAGNOSIS — M79674 Pain in right toe(s): Principal | ICD-10-CM

## 2018-02-12 DIAGNOSIS — M2041 Other hammer toe(s) (acquired), right foot: Secondary | ICD-10-CM | POA: Diagnosis not present

## 2018-02-12 DIAGNOSIS — E1151 Type 2 diabetes mellitus with diabetic peripheral angiopathy without gangrene: Secondary | ICD-10-CM

## 2018-02-12 DIAGNOSIS — M2011 Hallux valgus (acquired), right foot: Secondary | ICD-10-CM

## 2018-02-12 DIAGNOSIS — M79675 Pain in left toe(s): Principal | ICD-10-CM

## 2018-02-12 DIAGNOSIS — M2012 Hallux valgus (acquired), left foot: Secondary | ICD-10-CM

## 2018-02-12 NOTE — Progress Notes (Signed)

## 2018-02-13 DIAGNOSIS — D689 Coagulation defect, unspecified: Secondary | ICD-10-CM | POA: Diagnosis not present

## 2018-02-13 DIAGNOSIS — I482 Chronic atrial fibrillation, unspecified: Secondary | ICD-10-CM | POA: Diagnosis not present

## 2018-02-13 DIAGNOSIS — E1122 Type 2 diabetes mellitus with diabetic chronic kidney disease: Secondary | ICD-10-CM | POA: Diagnosis not present

## 2018-02-13 DIAGNOSIS — N2581 Secondary hyperparathyroidism of renal origin: Secondary | ICD-10-CM | POA: Diagnosis not present

## 2018-02-13 DIAGNOSIS — R52 Pain, unspecified: Secondary | ICD-10-CM | POA: Diagnosis not present

## 2018-02-13 DIAGNOSIS — D509 Iron deficiency anemia, unspecified: Secondary | ICD-10-CM | POA: Diagnosis not present

## 2018-02-13 DIAGNOSIS — Z4901 Encounter for fitting and adjustment of extracorporeal dialysis catheter: Secondary | ICD-10-CM | POA: Diagnosis not present

## 2018-02-13 DIAGNOSIS — N186 End stage renal disease: Secondary | ICD-10-CM | POA: Diagnosis not present

## 2018-02-13 DIAGNOSIS — D631 Anemia in chronic kidney disease: Secondary | ICD-10-CM | POA: Diagnosis not present

## 2018-02-15 DIAGNOSIS — D689 Coagulation defect, unspecified: Secondary | ICD-10-CM | POA: Diagnosis not present

## 2018-02-15 DIAGNOSIS — E1122 Type 2 diabetes mellitus with diabetic chronic kidney disease: Secondary | ICD-10-CM | POA: Diagnosis not present

## 2018-02-15 DIAGNOSIS — N186 End stage renal disease: Secondary | ICD-10-CM | POA: Diagnosis not present

## 2018-02-15 DIAGNOSIS — D631 Anemia in chronic kidney disease: Secondary | ICD-10-CM | POA: Diagnosis not present

## 2018-02-15 DIAGNOSIS — N2581 Secondary hyperparathyroidism of renal origin: Secondary | ICD-10-CM | POA: Diagnosis not present

## 2018-02-15 DIAGNOSIS — R52 Pain, unspecified: Secondary | ICD-10-CM | POA: Diagnosis not present

## 2018-02-15 DIAGNOSIS — D509 Iron deficiency anemia, unspecified: Secondary | ICD-10-CM | POA: Diagnosis not present

## 2018-02-15 DIAGNOSIS — I482 Chronic atrial fibrillation, unspecified: Secondary | ICD-10-CM | POA: Diagnosis not present

## 2018-02-15 DIAGNOSIS — Z4901 Encounter for fitting and adjustment of extracorporeal dialysis catheter: Secondary | ICD-10-CM | POA: Diagnosis not present

## 2018-02-16 ENCOUNTER — Ambulatory Visit: Payer: Medicare Other

## 2018-02-16 DIAGNOSIS — Z7901 Long term (current) use of anticoagulants: Secondary | ICD-10-CM | POA: Diagnosis not present

## 2018-02-16 DIAGNOSIS — I480 Paroxysmal atrial fibrillation: Secondary | ICD-10-CM | POA: Diagnosis not present

## 2018-02-16 LAB — POCT INR: INR: 3.9 — AB (ref 2.0–3.0)

## 2018-02-16 NOTE — Patient Instructions (Signed)
Description   Skip today's dosage of Coumadin, then start taking 1/2 tablet daily except 1 tablet on Mondays. Recheck in 2 weeks. Call us with any medication changes or concerns [phone].

## 2018-02-17 DIAGNOSIS — D631 Anemia in chronic kidney disease: Secondary | ICD-10-CM | POA: Diagnosis not present

## 2018-02-17 DIAGNOSIS — D509 Iron deficiency anemia, unspecified: Secondary | ICD-10-CM | POA: Diagnosis not present

## 2018-02-17 DIAGNOSIS — N2581 Secondary hyperparathyroidism of renal origin: Secondary | ICD-10-CM | POA: Diagnosis not present

## 2018-02-17 DIAGNOSIS — R52 Pain, unspecified: Secondary | ICD-10-CM | POA: Diagnosis not present

## 2018-02-17 DIAGNOSIS — I482 Chronic atrial fibrillation, unspecified: Secondary | ICD-10-CM | POA: Diagnosis not present

## 2018-02-17 DIAGNOSIS — N186 End stage renal disease: Secondary | ICD-10-CM | POA: Diagnosis not present

## 2018-02-17 DIAGNOSIS — D689 Coagulation defect, unspecified: Secondary | ICD-10-CM | POA: Diagnosis not present

## 2018-02-17 DIAGNOSIS — Z4901 Encounter for fitting and adjustment of extracorporeal dialysis catheter: Secondary | ICD-10-CM | POA: Diagnosis not present

## 2018-02-17 DIAGNOSIS — E1122 Type 2 diabetes mellitus with diabetic chronic kidney disease: Secondary | ICD-10-CM | POA: Diagnosis not present

## 2018-02-20 DIAGNOSIS — N186 End stage renal disease: Secondary | ICD-10-CM | POA: Diagnosis not present

## 2018-02-20 DIAGNOSIS — I482 Chronic atrial fibrillation, unspecified: Secondary | ICD-10-CM | POA: Diagnosis not present

## 2018-02-20 DIAGNOSIS — Z4901 Encounter for fitting and adjustment of extracorporeal dialysis catheter: Secondary | ICD-10-CM | POA: Diagnosis not present

## 2018-02-20 DIAGNOSIS — D631 Anemia in chronic kidney disease: Secondary | ICD-10-CM | POA: Diagnosis not present

## 2018-02-20 DIAGNOSIS — D689 Coagulation defect, unspecified: Secondary | ICD-10-CM | POA: Diagnosis not present

## 2018-02-20 DIAGNOSIS — D509 Iron deficiency anemia, unspecified: Secondary | ICD-10-CM | POA: Diagnosis not present

## 2018-02-20 DIAGNOSIS — N2581 Secondary hyperparathyroidism of renal origin: Secondary | ICD-10-CM | POA: Diagnosis not present

## 2018-02-20 DIAGNOSIS — E1122 Type 2 diabetes mellitus with diabetic chronic kidney disease: Secondary | ICD-10-CM | POA: Diagnosis not present

## 2018-02-20 DIAGNOSIS — R52 Pain, unspecified: Secondary | ICD-10-CM | POA: Diagnosis not present

## 2018-02-21 DIAGNOSIS — H35033 Hypertensive retinopathy, bilateral: Secondary | ICD-10-CM | POA: Diagnosis not present

## 2018-02-21 DIAGNOSIS — Z961 Presence of intraocular lens: Secondary | ICD-10-CM | POA: Diagnosis not present

## 2018-02-21 DIAGNOSIS — E113393 Type 2 diabetes mellitus with moderate nonproliferative diabetic retinopathy without macular edema, bilateral: Secondary | ICD-10-CM | POA: Diagnosis not present

## 2018-02-21 DIAGNOSIS — H353132 Nonexudative age-related macular degeneration, bilateral, intermediate dry stage: Secondary | ICD-10-CM | POA: Diagnosis not present

## 2018-02-22 ENCOUNTER — Telehealth: Payer: Self-pay | Admitting: *Deleted

## 2018-02-22 DIAGNOSIS — R52 Pain, unspecified: Secondary | ICD-10-CM | POA: Diagnosis not present

## 2018-02-22 DIAGNOSIS — D509 Iron deficiency anemia, unspecified: Secondary | ICD-10-CM | POA: Diagnosis not present

## 2018-02-22 DIAGNOSIS — N2581 Secondary hyperparathyroidism of renal origin: Secondary | ICD-10-CM | POA: Diagnosis not present

## 2018-02-22 DIAGNOSIS — Z4901 Encounter for fitting and adjustment of extracorporeal dialysis catheter: Secondary | ICD-10-CM | POA: Diagnosis not present

## 2018-02-22 DIAGNOSIS — E1122 Type 2 diabetes mellitus with diabetic chronic kidney disease: Secondary | ICD-10-CM | POA: Diagnosis not present

## 2018-02-22 DIAGNOSIS — D631 Anemia in chronic kidney disease: Secondary | ICD-10-CM | POA: Diagnosis not present

## 2018-02-22 DIAGNOSIS — N186 End stage renal disease: Secondary | ICD-10-CM | POA: Diagnosis not present

## 2018-02-22 DIAGNOSIS — D689 Coagulation defect, unspecified: Secondary | ICD-10-CM | POA: Diagnosis not present

## 2018-02-22 DIAGNOSIS — I482 Chronic atrial fibrillation, unspecified: Secondary | ICD-10-CM | POA: Diagnosis not present

## 2018-02-22 NOTE — Telephone Encounter (Signed)
Christine Santos calls because pt has pain at her "tailbone" that makes it difficult to sit.   She states that there is no broken skin.  A HH nurse came out today and thought it could be a cyst and advised to contact PCP.  Appt made for Monday (pt has dialysis today).  But Christine Santos wants to know if Dr. Andria Frames thinks that appt is necessary?  Will forward to MD. Christine Santos, Christine Santos, Christine Santos

## 2018-02-22 NOTE — Telephone Encounter (Signed)
Called and discussed.  Advised a donut.

## 2018-02-23 DIAGNOSIS — N186 End stage renal disease: Secondary | ICD-10-CM | POA: Diagnosis not present

## 2018-02-23 DIAGNOSIS — Z992 Dependence on renal dialysis: Secondary | ICD-10-CM | POA: Diagnosis not present

## 2018-02-23 DIAGNOSIS — E1122 Type 2 diabetes mellitus with diabetic chronic kidney disease: Secondary | ICD-10-CM | POA: Diagnosis not present

## 2018-02-24 DIAGNOSIS — D689 Coagulation defect, unspecified: Secondary | ICD-10-CM | POA: Diagnosis not present

## 2018-02-24 DIAGNOSIS — Z4901 Encounter for fitting and adjustment of extracorporeal dialysis catheter: Secondary | ICD-10-CM | POA: Diagnosis not present

## 2018-02-24 DIAGNOSIS — N186 End stage renal disease: Secondary | ICD-10-CM | POA: Diagnosis not present

## 2018-02-24 DIAGNOSIS — I482 Chronic atrial fibrillation, unspecified: Secondary | ICD-10-CM | POA: Diagnosis not present

## 2018-02-24 DIAGNOSIS — L03114 Cellulitis of left upper limb: Secondary | ICD-10-CM | POA: Diagnosis not present

## 2018-02-24 DIAGNOSIS — D631 Anemia in chronic kidney disease: Secondary | ICD-10-CM | POA: Diagnosis not present

## 2018-02-24 DIAGNOSIS — D509 Iron deficiency anemia, unspecified: Secondary | ICD-10-CM | POA: Diagnosis not present

## 2018-02-24 DIAGNOSIS — N2581 Secondary hyperparathyroidism of renal origin: Secondary | ICD-10-CM | POA: Diagnosis not present

## 2018-02-26 ENCOUNTER — Ambulatory Visit: Payer: Medicare Other | Admitting: Family Medicine

## 2018-02-27 DIAGNOSIS — D631 Anemia in chronic kidney disease: Secondary | ICD-10-CM | POA: Diagnosis not present

## 2018-02-27 DIAGNOSIS — D689 Coagulation defect, unspecified: Secondary | ICD-10-CM | POA: Diagnosis not present

## 2018-02-27 DIAGNOSIS — I482 Chronic atrial fibrillation, unspecified: Secondary | ICD-10-CM | POA: Diagnosis not present

## 2018-02-27 DIAGNOSIS — D509 Iron deficiency anemia, unspecified: Secondary | ICD-10-CM | POA: Diagnosis not present

## 2018-02-27 DIAGNOSIS — L03114 Cellulitis of left upper limb: Secondary | ICD-10-CM | POA: Diagnosis not present

## 2018-02-27 DIAGNOSIS — Z4901 Encounter for fitting and adjustment of extracorporeal dialysis catheter: Secondary | ICD-10-CM | POA: Diagnosis not present

## 2018-02-27 DIAGNOSIS — N186 End stage renal disease: Secondary | ICD-10-CM | POA: Diagnosis not present

## 2018-02-27 DIAGNOSIS — N2581 Secondary hyperparathyroidism of renal origin: Secondary | ICD-10-CM | POA: Diagnosis not present

## 2018-02-28 ENCOUNTER — Other Ambulatory Visit: Payer: Self-pay | Admitting: *Deleted

## 2018-02-28 ENCOUNTER — Telehealth: Payer: Self-pay | Admitting: *Deleted

## 2018-02-28 NOTE — Telephone Encounter (Signed)
Patient with diagnosis of Afib on warfarin for anticoagulation.    Procedure: AVG Date of procedure: 03/07/18  CHADS2-VASc score of  8 (CHF, HTN, AGE, DM2, stroke/tia x 2, CAD, AGE, female)  Per office protocol, patient can hold warfarin for 5 days prior to procedure WITH bridging with Lovenox (enoxaparin) around procedure.  Will call pt as appt is exactly 5 days prior to procedure and will need to be sure that she does not take warfarin that morning. Spoke with [name], pt's sister who states understanding and will accompany her to appt on Friday for instructions on lovenox.

## 2018-02-28 NOTE — Telephone Encounter (Signed)
Patient is scheduled to have left upper arm AVG placement on 03-07-2018 with Dr. Servando Snare. Please address her Coumadin hold and possible Lovenox when she is seen there on 03-02-2018.

## 2018-03-01 DIAGNOSIS — N186 End stage renal disease: Secondary | ICD-10-CM | POA: Diagnosis not present

## 2018-03-01 DIAGNOSIS — N2581 Secondary hyperparathyroidism of renal origin: Secondary | ICD-10-CM | POA: Diagnosis not present

## 2018-03-01 DIAGNOSIS — I482 Chronic atrial fibrillation, unspecified: Secondary | ICD-10-CM | POA: Diagnosis not present

## 2018-03-01 DIAGNOSIS — Z4901 Encounter for fitting and adjustment of extracorporeal dialysis catheter: Secondary | ICD-10-CM | POA: Diagnosis not present

## 2018-03-01 DIAGNOSIS — L03114 Cellulitis of left upper limb: Secondary | ICD-10-CM | POA: Diagnosis not present

## 2018-03-01 DIAGNOSIS — D689 Coagulation defect, unspecified: Secondary | ICD-10-CM | POA: Diagnosis not present

## 2018-03-01 DIAGNOSIS — D509 Iron deficiency anemia, unspecified: Secondary | ICD-10-CM | POA: Diagnosis not present

## 2018-03-01 DIAGNOSIS — D631 Anemia in chronic kidney disease: Secondary | ICD-10-CM | POA: Diagnosis not present

## 2018-03-02 ENCOUNTER — Ambulatory Visit: Payer: Medicare Other | Admitting: Pharmacist

## 2018-03-02 DIAGNOSIS — Z7901 Long term (current) use of anticoagulants: Secondary | ICD-10-CM

## 2018-03-02 DIAGNOSIS — I480 Paroxysmal atrial fibrillation: Secondary | ICD-10-CM

## 2018-03-02 LAB — POCT INR: INR: 3.4 — AB (ref 2.0–3.0)

## 2018-03-02 MED ORDER — ENOXAPARIN SODIUM 60 MG/0.6ML ~~LOC~~ SOLN: 60.0000 mg | Freq: Two times a day (BID) | SUBCUTANEOUS | 1 refills | Status: DC

## 2018-03-02 NOTE — Patient Instructions (Addendum)
2/7: No Coumadin or Lovenox.  2/8: No Coumadin or Lovenox.  2/9: Inject Lovenox 60mg  in the fatty abdominal tissue at least 2 inches from the belly button twice a day about 12 hours apart, 8am and 8pm rotate sites. No Coumadin.  2/10: Inject Lovenox in the fatty tissue every 12 hours, 8am and 8pm. No Coumadin.  2/11: Inject Lovenox in the fatty tissue in the morning at 8 am (No PM dose). No Coumadin.  2/12: Procedure Day - No Lovenox - Resume Coumadin in the evening or as directed by doctor (take an extra half tablet with usual dose for 2 days then resume normal dose).  2/13: Resume Lovenox inject in the fatty tissue every 12 hours and take Coumadin.  2/14: Inject Lovenox in the fatty tissue every 12 hours and take Coumadin.  2/15: Inject Lovenox in the fatty tissue every 12 hours and take Coumadin.  2/16: Inject Lovenox in the fatty tissue every 12 hours and take Coumadin.  2/17: Coumadin appt to check INR.

## 2018-03-03 DIAGNOSIS — D509 Iron deficiency anemia, unspecified: Secondary | ICD-10-CM | POA: Diagnosis not present

## 2018-03-03 DIAGNOSIS — N186 End stage renal disease: Secondary | ICD-10-CM | POA: Diagnosis not present

## 2018-03-03 DIAGNOSIS — Z4901 Encounter for fitting and adjustment of extracorporeal dialysis catheter: Secondary | ICD-10-CM | POA: Diagnosis not present

## 2018-03-03 DIAGNOSIS — L03114 Cellulitis of left upper limb: Secondary | ICD-10-CM | POA: Diagnosis not present

## 2018-03-03 DIAGNOSIS — I482 Chronic atrial fibrillation, unspecified: Secondary | ICD-10-CM | POA: Diagnosis not present

## 2018-03-03 DIAGNOSIS — D631 Anemia in chronic kidney disease: Secondary | ICD-10-CM | POA: Diagnosis not present

## 2018-03-03 DIAGNOSIS — D689 Coagulation defect, unspecified: Secondary | ICD-10-CM | POA: Diagnosis not present

## 2018-03-03 DIAGNOSIS — N2581 Secondary hyperparathyroidism of renal origin: Secondary | ICD-10-CM | POA: Diagnosis not present

## 2018-03-05 ENCOUNTER — Other Ambulatory Visit: Payer: Self-pay | Admitting: Family Medicine

## 2018-03-05 ENCOUNTER — Other Ambulatory Visit: Payer: Self-pay

## 2018-03-05 ENCOUNTER — Encounter (HOSPITAL_COMMUNITY): Payer: Self-pay | Admitting: *Deleted

## 2018-03-05 NOTE — Progress Notes (Signed)
Christine Santos denies chest pain or shortness of breath. Christine Santos has type II diabetes.  I instructed patient that if CBG > 70 on Wednesday to take 6 units of Lantus- 1/2 of scheduled dose. If CBG is greater than 220 , take the 6 units of Lantus and 1/2 dose of SS Insulin. I instructed patient to check CBG after awaking and every 2 hours until arrival  to the hospital.  I Instructed patient if CBG is less than 70 take 4  Glucose Tablets. Recheck CBG in 15 minutes then call pre- op desk at [phone] for further instructions. If scheduled to receive Insulin, do not take Insulin

## 2018-03-06 ENCOUNTER — Other Ambulatory Visit: Payer: Self-pay | Admitting: Family Medicine

## 2018-03-06 DIAGNOSIS — N186 End stage renal disease: Secondary | ICD-10-CM | POA: Diagnosis not present

## 2018-03-06 DIAGNOSIS — I482 Chronic atrial fibrillation, unspecified: Secondary | ICD-10-CM | POA: Diagnosis not present

## 2018-03-06 DIAGNOSIS — Z4901 Encounter for fitting and adjustment of extracorporeal dialysis catheter: Secondary | ICD-10-CM | POA: Diagnosis not present

## 2018-03-06 DIAGNOSIS — N2581 Secondary hyperparathyroidism of renal origin: Secondary | ICD-10-CM | POA: Diagnosis not present

## 2018-03-06 DIAGNOSIS — D689 Coagulation defect, unspecified: Secondary | ICD-10-CM | POA: Diagnosis not present

## 2018-03-06 DIAGNOSIS — L03114 Cellulitis of left upper limb: Secondary | ICD-10-CM | POA: Diagnosis not present

## 2018-03-06 DIAGNOSIS — D509 Iron deficiency anemia, unspecified: Secondary | ICD-10-CM | POA: Diagnosis not present

## 2018-03-06 DIAGNOSIS — D631 Anemia in chronic kidney disease: Secondary | ICD-10-CM | POA: Diagnosis not present

## 2018-03-07 ENCOUNTER — Encounter (HOSPITAL_COMMUNITY)
Admission: RE | Disposition: A | Discharge status: Home / Self Care | Payer: Self-pay | Source: Home / Self Care | Attending: Vascular Surgery

## 2018-03-07 ENCOUNTER — Encounter: Payer: Medicare Other | Admitting: *Deleted

## 2018-03-07 ENCOUNTER — Ambulatory Visit (HOSPITAL_COMMUNITY): Payer: Medicare Other | Admitting: Certified Registered Nurse Anesthetist

## 2018-03-07 ENCOUNTER — Encounter (HOSPITAL_COMMUNITY): Payer: Self-pay | Admitting: *Deleted

## 2018-03-07 ENCOUNTER — Other Ambulatory Visit: Payer: Self-pay

## 2018-03-07 ENCOUNTER — Ambulatory Visit (HOSPITAL_COMMUNITY)
Admission: RE | Admit: 2018-03-07 | Discharge: 2018-03-07 | Disposition: A | Discharge status: Home / Self Care | Payer: Medicare Other | Attending: Vascular Surgery | Admitting: Vascular Surgery

## 2018-03-07 DIAGNOSIS — C9110 Chronic lymphocytic leukemia of B-cell type not having achieved remission: Secondary | ICD-10-CM | POA: Diagnosis not present

## 2018-03-07 DIAGNOSIS — Z951 Presence of aortocoronary bypass graft: Secondary | ICD-10-CM | POA: Diagnosis not present

## 2018-03-07 DIAGNOSIS — I5032 Chronic diastolic (congestive) heart failure: Secondary | ICD-10-CM | POA: Diagnosis not present

## 2018-03-07 DIAGNOSIS — I2510 Atherosclerotic heart disease of native coronary artery without angina pectoris: Secondary | ICD-10-CM | POA: Diagnosis not present

## 2018-03-07 DIAGNOSIS — M109 Gout, unspecified: Secondary | ICD-10-CM | POA: Insufficient documentation

## 2018-03-07 DIAGNOSIS — Z79899 Other long term (current) drug therapy: Secondary | ICD-10-CM | POA: Diagnosis not present

## 2018-03-07 DIAGNOSIS — I132 Hypertensive heart and chronic kidney disease with heart failure and with stage 5 chronic kidney disease, or end stage renal disease: Secondary | ICD-10-CM | POA: Insufficient documentation

## 2018-03-07 DIAGNOSIS — Z794 Long term (current) use of insulin: Secondary | ICD-10-CM | POA: Insufficient documentation

## 2018-03-07 DIAGNOSIS — K219 Gastro-esophageal reflux disease without esophagitis: Secondary | ICD-10-CM | POA: Diagnosis not present

## 2018-03-07 DIAGNOSIS — E785 Hyperlipidemia, unspecified: Secondary | ICD-10-CM | POA: Insufficient documentation

## 2018-03-07 DIAGNOSIS — T82898A Other specified complication of vascular prosthetic devices, implants and grafts, initial encounter: Secondary | ICD-10-CM | POA: Diagnosis not present

## 2018-03-07 DIAGNOSIS — F419 Anxiety disorder, unspecified: Secondary | ICD-10-CM | POA: Insufficient documentation

## 2018-03-07 DIAGNOSIS — Z992 Dependence on renal dialysis: Secondary | ICD-10-CM | POA: Insufficient documentation

## 2018-03-07 DIAGNOSIS — Z7901 Long term (current) use of anticoagulants: Secondary | ICD-10-CM | POA: Insufficient documentation

## 2018-03-07 DIAGNOSIS — Z006 Encounter for examination for normal comparison and control in clinical research program: Secondary | ICD-10-CM

## 2018-03-07 DIAGNOSIS — E1151 Type 2 diabetes mellitus with diabetic peripheral angiopathy without gangrene: Secondary | ICD-10-CM | POA: Diagnosis not present

## 2018-03-07 DIAGNOSIS — I480 Paroxysmal atrial fibrillation: Secondary | ICD-10-CM | POA: Diagnosis not present

## 2018-03-07 DIAGNOSIS — Z8673 Personal history of transient ischemic attack (TIA), and cerebral infarction without residual deficits: Secondary | ICD-10-CM | POA: Insufficient documentation

## 2018-03-07 DIAGNOSIS — E1122 Type 2 diabetes mellitus with diabetic chronic kidney disease: Secondary | ICD-10-CM | POA: Diagnosis not present

## 2018-03-07 DIAGNOSIS — I5022 Chronic systolic (congestive) heart failure: Secondary | ICD-10-CM | POA: Diagnosis not present

## 2018-03-07 DIAGNOSIS — N186 End stage renal disease: Secondary | ICD-10-CM | POA: Diagnosis not present

## 2018-03-07 HISTORY — PX: AV FISTULA PLACEMENT: SHX1204

## 2018-03-07 HISTORY — DX: Headache, unspecified: R51.9

## 2018-03-07 HISTORY — DX: Headache: R51

## 2018-03-07 HISTORY — DX: Cardiac arrhythmia, unspecified: I49.9

## 2018-03-07 LAB — POCT I-STAT 4, (NA,K, GLUC, HGB,HCT)
Glucose, Bld: 100 mg/dL — ABNORMAL HIGH (ref 70–99)
HCT: 39 % (ref 36.0–46.0)
Hemoglobin: 13.3 g/dL (ref 12.0–15.0)
Potassium: 4.1 mmol/L (ref 3.5–5.1)
Sodium: 140 mmol/L (ref 135–145)

## 2018-03-07 LAB — PROTIME-INR
INR: 1.14
Prothrombin Time: 14.5 seconds (ref 11.4–15.2)

## 2018-03-07 LAB — GLUCOSE, CAPILLARY
Glucose-Capillary: 100 mg/dL — ABNORMAL HIGH (ref 70–99)
Glucose-Capillary: 83 mg/dL (ref 70–99)

## 2018-03-07 LAB — APTT: APTT: 38 s — AB (ref 24–36)

## 2018-03-07 SURGERY — INSERTION OF ARTERIOVENOUS (AV) GORE-TEX GRAFT ARM
Anesthesia: Monitor Anesthesia Care | Site: Arm Upper | Laterality: Left

## 2018-03-07 MED ORDER — PROPOFOL 10 MG/ML IV BOLUS
INTRAVENOUS | Status: AC
  Filled 2018-03-07: qty 40

## 2018-03-07 MED ORDER — MIDAZOLAM HCL 2 MG/2ML IJ SOLN
INTRAMUSCULAR | Status: AC
  Filled 2018-03-07: qty 2

## 2018-03-07 MED ORDER — FENTANYL CITRATE (PF) 250 MCG/5ML IJ SOLN
INTRAMUSCULAR | Status: AC
  Filled 2018-03-07: qty 5

## 2018-03-07 MED ORDER — 0.9 % SODIUM CHLORIDE (POUR BTL) OPTIME
TOPICAL | Status: DC | PRN
  Administered 2018-03-07: 1000 mL

## 2018-03-07 MED ORDER — MIDAZOLAM HCL 5 MG/5ML IJ SOLN
INTRAMUSCULAR | Status: DC | PRN
  Administered 2018-03-07 (×2): 1 mg via INTRAVENOUS

## 2018-03-07 MED ORDER — INSULIN GLARGINE 100 UNIT/ML SOLOSTAR PEN: 5.0000 [IU] | PEN_INJECTOR | Freq: Every day | SUBCUTANEOUS | Status: DC

## 2018-03-07 MED ORDER — SODIUM CHLORIDE 0.9 % IV SOLN
INTRAVENOUS | Status: AC
  Filled 2018-03-07: qty 1.2

## 2018-03-07 MED ORDER — SODIUM CHLORIDE 0.9 % IV SOLN
INTRAVENOUS | Status: DC | PRN
  Administered 2018-03-07: 50 ug/min via INTRAVENOUS

## 2018-03-07 MED ORDER — SODIUM CHLORIDE 0.9 % IV SOLN
INTRAVENOUS | Status: DC | PRN
  Administered 2018-03-07: 500 mL

## 2018-03-07 MED ORDER — SODIUM CHLORIDE 0.9 % IV SOLN
INTRAVENOUS | Status: DC
  Administered 2018-03-07: 09:00:00 via INTRAVENOUS

## 2018-03-07 MED ORDER — LIDOCAINE-EPINEPHRINE (PF) 1 %-1:200000 IJ SOLN
INTRAMUSCULAR | Status: DC | PRN
  Administered 2018-03-07 (×2): 30 mL

## 2018-03-07 MED ORDER — VANCOMYCIN HCL IN DEXTROSE 1-5 GM/200ML-% IV SOLN
1000.0000 mg | INTRAVENOUS | Status: AC
  Administered 2018-03-07: 1000 mg via INTRAVENOUS
  Filled 2018-03-07: qty 200

## 2018-03-07 MED ORDER — PROPOFOL 500 MG/50ML IV EMUL
INTRAVENOUS | Status: DC | PRN
  Administered 2018-03-07: 75 ug/kg/min via INTRAVENOUS

## 2018-03-07 MED ORDER — LIDOCAINE-EPINEPHRINE (PF) 1 %-1:200000 IJ SOLN
INTRAMUSCULAR | Status: AC
  Filled 2018-03-07: qty 30

## 2018-03-07 MED ORDER — ONDANSETRON HCL 4 MG/2ML IJ SOLN
INTRAMUSCULAR | Status: DC | PRN
  Administered 2018-03-07: 4 mg via INTRAVENOUS

## 2018-03-07 SURGICAL SUPPLY — 34 items
ARMBAND PINK RESTRICT EXTREMIT (MISCELLANEOUS) ×4 IMPLANT
CANISTER SUCT 3000ML PPV (MISCELLANEOUS) ×2 IMPLANT
CLIP VESOCCLUDE MED 6/CT (CLIP) ×4 IMPLANT
CLIP VESOCCLUDE SM WIDE 6/CT (CLIP) ×2 IMPLANT
COVER WAND RF STERILE (DRAPES) ×2 IMPLANT
DERMABOND ADVANCED (GAUZE/BANDAGES/DRESSINGS) ×1
DERMABOND ADVANCED .7 DNX12 (GAUZE/BANDAGES/DRESSINGS) ×1 IMPLANT
ELECT REM PT RETURN 9FT ADLT (ELECTROSURGICAL) ×2
ELECTRODE REM PT RTRN 9FT ADLT (ELECTROSURGICAL) ×1 IMPLANT
GLOVE BIO SURGEON STRL SZ7.5 (GLOVE) ×2 IMPLANT
GOWN STRL REUS W/ TWL LRG LVL3 (GOWN DISPOSABLE) ×2 IMPLANT
GOWN STRL REUS W/ TWL XL LVL3 (GOWN DISPOSABLE) ×1 IMPLANT
GOWN STRL REUS W/TWL LRG LVL3 (GOWN DISPOSABLE) ×2
GOWN STRL REUS W/TWL XL LVL3 (GOWN DISPOSABLE) ×1
GRAFT COLLAGEN VASCULAR 7X40 (Vascular Products) ×2 IMPLANT
HEMOSTAT SNOW SURGICEL 2X4 (HEMOSTASIS) IMPLANT
INSERT FOGARTY SM (MISCELLANEOUS) IMPLANT
KIT BASIN OR (CUSTOM PROCEDURE TRAY) ×2 IMPLANT
KIT TURNOVER KIT B (KITS) ×2 IMPLANT
NS IRRIG 1000ML POUR BTL (IV SOLUTION) ×2 IMPLANT
PACK CV ACCESS (CUSTOM PROCEDURE TRAY) ×2 IMPLANT
PAD ARMBOARD 7.5X6 YLW CONV (MISCELLANEOUS) ×4 IMPLANT
SUT MNCRL AB 4-0 PS2 18 (SUTURE) ×4 IMPLANT
SUT PROLENE 5 0 C 1 24 (SUTURE) ×4 IMPLANT
SUT PROLENE 6 0 BV (SUTURE) ×6 IMPLANT
SUT SILK 2 0 SH (SUTURE) ×2 IMPLANT
SUT VIC AB 3-0 SH 27 (SUTURE) ×2
SUT VIC AB 3-0 SH 27X BRD (SUTURE) ×2 IMPLANT
SYR TOOMEY 50ML (SYRINGE) ×2 IMPLANT
SYRINGE 20CC LL (MISCELLANEOUS) ×2 IMPLANT
SYRINGE 60CC CATHETER 2 OZ (MISCELLANEOUS) ×2 IMPLANT
TOWEL GREEN STERILE (TOWEL DISPOSABLE) ×2 IMPLANT
UNDERPAD 30X30 (UNDERPADS AND DIAPERS) ×2 IMPLANT
WATER STERILE IRR 1000ML POUR (IV SOLUTION) ×2 IMPLANT

## 2018-03-07 NOTE — Op Note (Signed)
    Patient name: Christine Santos MRN: [id number] DOB: 09-20-1945 Sex: female  03/07/2018 Pre-operative Diagnosis: esrd Post-operative diagnosis:  Same Surgeon:  Erlene Quan C. Donzetta Matters, MD Assistant: Leontine Locket, PA Procedure Performed: Left upper arm fistula revision with interposition 1m artegraft  Indications: 73year old female with history end-stage renal disease currently on dialysis via catheter.  She has had a left upper arm second stage basilic vein fistula that has really never worked except for a couple cannulations.  She is now indicated for revision and replacement with graft.  Findings: There is strong inflow in the fistula external diameter 4 mm just after the anastomosis.  The axillary vein was large.  Artegraft was placed as interposition was a strong thrill and good signal at the radial artery at the wrist completion.   Procedure:  The patient was identified in the holding area and taken to the operating room where she is placed upon the operative table MAC anesthesia induced.  She sterilely prepped draped left upper extremity usual fashion antibiotics were administered and timeout was called.  We first anesthetized the above the antecubital axillary incision with 1% lidocaine with epinephrine as well as the tunneling site.  Incisions were made at the previous sites in the axilla and just above the antecubitum.  We dissected out the fistula both areas marked for orientation.  I tunneled a 6 mm Artegraft in between the 2 incisions.  I transected the fistula near the axilla flushed with heparinized saline and reclamped it.  The Artegraft was sewn end-to-end with 6-0 Prolene suture.  Upon completion we then flushed through the AFairview  We then clamped the fistula of the antecubitum proximally and distally transected it spatulated the anastomotic side.  The Artegraft was trimmed to size sewn end-to-end with 6-0 Prolene sutures.  This time prior to the completion anastomosis we allowed  flushing all directions.  Upon ablation there is good thrill in the runoff vein confirmed with Doppler as well as a radial artery signal at the wrist confirmed with Doppler did augment with compression of the graft.  We then flushed our remaining fistula segment with saline to irrigate out the blood tied off both sides with 2-0 silk suture.  Irrigated the wound team hemostasis closed in layers with Vicryl Monocryl.  Dermabond placed to level skin.  She was allowed awaken anesthesia having tolerated procedure well immediate complication.  EBL: 50 cc.   Lemon Sternberg C. CDonzetta Matters MD [facility] GFloydOffice: [phone]: [phone]

## 2018-03-07 NOTE — H&P (Signed)
Subjective:     Christine Santos is a 72 y.o. female previously underwent left to stage basilic vein fistula.  She is on dialysis via right IJ catheter.  Recent underwent drug-coated balloon angioplasty of the fistula.  There was a strong thrill at completion.  Now they cannot cannulate fistula.       Past Medical History:  Diagnosis Date  . Anemia 08/29/2011  . Anxiety   . Arthritis    "legs" (01/22/2013)  . CAD (coronary artery disease)    s/p CABG in 1999  . Cataract   . Chronic kidney disease    Sees Dr [name]  . CLL (chronic lymphoblastic leukemia)    Kidney  . Diastolic heart failure ([place])   . GERD (gastroesophageal reflux disease)   . Gout   . H/O hiatal hernia   . Heart murmur   . Hyperlipidemia   . Hypertension   . IDDM (insulin dependent diabetes mellitus) ([place])    Type II  . PAD (peripheral artery disease) ([place])   . Paroxysmal atrial fibrillation (H[facility])    a. identified on ILR as part of StrokeAF study  . Peripheral vascular disease ([place])   . Pneumonia 11/2016  . 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 Still has balance issues         Family History  Problem Relation Age of Onset  . Heart disease Mother   . Heart attack Mother   . Hypertension Mother   . Kidney disease Mother   . Pneumonia Father   . Heart attack Father   . Hyperlipidemia Father   . Diabetes Sister   . Breast cancer Sister   . Arthritis Brother   . Diabetes Brother   . Heart attack Sister   . Obesity Sister   . Hypertension Sister   . Heart attack Sister   . Cancer Sister        breast  . Hyperlipidemia Sister   . Hypertension Sister   . Hyperlipidemia Sister   . Breast cancer Maternal Grandmother         Past Surgical History:  Procedure Laterality Date  . A/V FISTULAGRAM Left 12/25/2017   Procedure: A/V FISTULAGRAM;  Surgeon: Waynetta Sandy, MD;  Location: [facility];   Service: Cardiovascular;  Laterality: Left;  . A/V SHUNT INTERVENTION Left 12/25/2017   Procedure: A/V SHUNT INTERVENTION;  Surgeon: Waynetta Sandy, MD;  Location: [facility];  Service: Cardiovascular;  Laterality: Left;  . ANGIOPLASTY / STENTING FEMORAL Right 09/2010   SFA/notes 11/25/2010 (01/22/2013)  . ANGIOPLASTY / STENTING FEMORAL Left 11/2010   SFA/notes 11/25/2010 (01/22/2013)  . ANGIOPLASTY / STENTING ILIAC     [name] 11/25/2010 (01/22/2013)  . BASCILIC VEIN TRANSPOSITION Left 08/23/2017   Procedure: FIRST STAGE BASILIC VEIN TRANSPOSITION LEFT UPPER EXTREMITY;  Surgeon: [name], MD;  Location: [facility];  Service: Vascular;  Laterality: Left;  . BASCILIC VEIN TRANSPOSITION Left 10/23/2017   Procedure: SECOND STAGE BASILIC VEIN TRANSPOSITION LEFT ARM;  Surgeon: [name], MD;  Location: [facility];  Service: Vascular;  Laterality: Left;  . CATARACT EXTRACTION W/ INTRAOCULAR LENS  IMPLANT, BILATERAL Bilateral ?2011  . CHOLECYSTECTOMY  1993  . CORONARY ARTERY BYPASS GRAFT  10/1994   "CABG X3"  . EP IMPLANTABLE DEVICE N/A 09/11/2015   Procedure: Loop Recorder Insertion;  Surgeon: Thompson Grayer, MD;  Location: [facility];  Service: Cardiovascular;  Laterality: N/A;  .  HEEL SPUR EXCISION Bilateral 1970's  . IR FLUORO GUIDE CV LINE RIGHT  11/01/2017  . IR FLUORO GUIDE CV LINE RIGHT  11/05/2017  . IR US GUIDE VASC ACCESS RIGHT  11/01/2017  . IR US GUIDE VASC ACCESS RIGHT  11/05/2017  . LOWER EXTREMITY ANGIOGRAM N/A 01/22/2013   Procedure: LOWER EXTREMITY ANGIOGRAM;  Surgeon: [name], MD;  Location: [facility];  Service: Cardiovascular;  Laterality: N/A;  . SHOULDER OPEN ROTATOR CUFF REPAIR Bilateral 1990's   "2 times on 1 side; once on the other"  . TRANSLUMINAL ATHERECTOMY FEMORAL ARTERY Right 01/22/2013   & balloon  . TUBAL LIGATION  1980's  . VIDEO ASSISTED THORACOSCOPY (VATS)/EMPYEMA Left 11/29/2016   Procedure: VIDEO  ASSISTED THORACOSCOPY (VATS)/EMPYEMA;  Surgeon: [name], MD;  Location: [facility];  Service: Thoracic;  Laterality: Left;  VIDEO ASSISTED THORACOSCOPY (VATS)/EMPYEMA    Short Social History:  Social History       Tobacco Use  . Smoking status: Never Smoker  . Smokeless tobacco: Never Used  Substance Use Topics  . Alcohol use: No         Allergies  Allergen Reactions  . Cefepime Anaphylaxis    Throat swelling  . Other Other (See Comments)    Darvocet-rash   . Amoxicillin Rash    Has patient had a PCN reaction causing immediate rash, facial/tongue/throat swelling, SOB or lightheadedness with hypotension: No Has patient had a PCN reaction causing severe rash involving mucus membranes or skin necrosis: No Has patient had a PCN reaction that required hospitalization: No Has patient had a PCN reaction occurring within the last 10 years: No If all of the above answers are "NO", then may proceed with Cephalosporin use.   . Codeine Phosphate Rash  . Penicillins Rash    Has patient had a PCN reaction causing immediate rash, facial/tongue/throat swelling, SOB or lightheadedness with hypotension: No Has patient had a PCN reaction causing severe rash involving mucus membranes or skin necrosis: No Has patient had a PCN reaction that required hospitalization: No Has patient had a PCN reaction occurring within the last 10 years: No If all of the above answers are "NO", then may proceed with Cephalosporin use.            Current Outpatient Medications  Medication Sig Dispense Refill  . ACCU-CHEK AVIVA PLUS test strip TEST THREE TIMES A DAY 300 each 3  . allopurinol (ZYLOPRIM) 100 MG tablet Take 0.5 tablets (50 mg total) by mouth daily. 30 tablet 0  . Ascorbic Acid (VITAMIN C) 1000 MG tablet Take 1,000 mg by mouth daily.    . benzonatate (TESSALON) 100 MG capsule Take 1 capsule (100 mg total) by mouth 2 (two) times daily as needed for cough. 20 capsule 2  .  carboxymethylcellulose (REFRESH PLUS) 0.5 % SOLN Place 1 drop into both eyes 3 (three) times daily.     . ferrous sulfate 325 (65 FE) MG tablet Take 1 tablet (325 mg total) by mouth daily with breakfast.  3  . GNP GARLIC EXTRACT PO Take 1 tablet by mouth daily.     Marland Kitchen HUMALOG KWIKPEN 100 UNIT/ML KiwkPen Inject 5 Units as directed 3 (three) times daily after meals.   3  . Insulin Glargine (LANTUS SOLOSTAR) 100 UNIT/ML Solostar Pen Inject 5 Units into the skin daily. 15 mL 12  . Melatonin 10 MG CAPS Take 10 mg by mouth at bedtime.     . metoprolol tartrate (LOPRESSOR) 25 MG tablet  Take 1 tablet (25 mg total) by mouth 2 (two) times daily. (Patient taking differently: Take 12.5 mg by mouth 2 (two) times daily. ) 30 tablet 0  . Multiple Vitamins-Minerals (EYE VITAMINS PO) Take 1 capsule by mouth daily. Taking one daily from her opthamologist (Dr. Herbert Deaner)     . Multiple Vitamins-Minerals (MULTIVITAMINS THER. W/MINERALS) TABS Take 1 tablet by mouth daily.      Marland Kitchen omega-3 acid ethyl esters (LOVAZA) 1 g capsule Take 2 g by mouth 2 (two) times daily.     . pantoprazole (PROTONIX) 40 MG tablet TAKE 1 TABLET BY MOUTH DAILY (Patient taking differently: Take 40 mg by mouth at bedtime. ) 90 tablet 3  . PARoxetine (PAXIL) 40 MG tablet Take 1 tablet (40 mg total) by mouth every morning. 90 tablet 3  . rosuvastatin (CRESTOR) 20 MG tablet Take 20 mg by mouth at bedtime.    . torsemide (DEMADEX) 100 MG tablet Take 1 tablet (100 mg total) by mouth daily.    . traMADol (ULTRAM) 50 MG tablet Take 1 tablet (50 mg total) by mouth every 6 (six) hours as needed (for pain.). 360 tablet 1  . warfarin (COUMADIN) 5 MG tablet Take 1/2 to 1 tablet daily as directed by Coumadin clinic 30 tablet 2  . enoxaparin (LOVENOX) 60 MG/0.6ML injection Inject 0.6 mLs (60 mg total) into the skin daily. (Patient not taking: Reported on 01/26/2018) 10 Syringe 1  . hydrALAZINE (APRESOLINE) 50 MG tablet TAKE 1 TABLET BY MOUTH THREE  TIMES DAILY (Patient not taking: Reported on 01/26/2018) 270 tablet 3  . ondansetron (ZOFRAN) 4 MG tablet Take 1 tablet (4 mg total) by mouth every 8 (eight) hours as needed for nausea or vomiting. (Patient not taking: Reported on 01/26/2018) 20 tablet 1  . rosuvastatin (CRESTOR) 10 MG tablet Take 1 tablet (10 mg total) by mouth at bedtime. (Patient not taking: Reported on 01/26/2018) 30 tablet 0  . vancomycin (VANCOCIN) 250 MG capsule Take 1 capsule (250 mg total) by mouth 4 (four) times daily. (Patient not taking: Reported on 01/26/2018) 40 capsule 1   No current facility-administered medications for this visit.     Review of Systems  Constitutional: Positive for fatigue.  Respiratory: Positive for cough.  Cardiovascular: Cardiovascular negative.  GI: Gastrointestinal negative.  Skin: Skin negative.  Neurological: Neurological negative. Hematologic: Hematologic/lymphatic negative.  Psychiatric: Psychiatric negative.        Objective:   Objective        Vitals:   01/26/18 1350  BP: 137/77  Pulse: 96  Resp: 14  Weight: 141 lb (64 kg)  Height: 5' 5.5" (1.664 m)   Body mass index is 23.11 kg/m.  Physical Exam Constitutional:      Appearance: Normal appearance.  Eyes:     Pupils: Pupils are equal, round, and reactive to light.  Cardiovascular:     Rate and Rhythm: Normal rate.     Pulses:          Radial pulses are 0 on the left side.  Pulmonary:     Effort: Pulmonary effort is normal.  Musculoskeletal:        General: Swelling present.     Comments: Left upper arm has strong thrill pulsatility towards the axilla  Skin:    General: Skin is warm and dry.  Neurological:     General: No focal deficit present.     Mental Status: She is alert and oriented to person, place, and time.  Psychiatric:  Mood and Affect: Mood normal.        Judgment: Judgment normal.        Assessment/Plan:   73 year-old female with esrd here for conversion to left arm  av graft. We have discussed risks and benefits and will proceed.     Waynetta Sandy MD [facility]

## 2018-03-07 NOTE — Discharge Instructions (Signed)
° °  [facility]  Discharge Instructions  AV Fistula or Graft Surgery for Dialysis Access  Please refer to the following instructions for your post-procedure care. Your surgeon or physician assistant will discuss any changes with you.  Activity  You may drive the day following your surgery, if you are comfortable and no longer taking prescription pain medication. Resume full activity as the soreness in your incision resolves.  Bathing/Showering  You may shower after you go home. Keep your incision dry for 48 hours. Do not soak in a bathtub, hot tub, or swim until the incision heals completely. You may not shower if you have a hemodialysis catheter.  Incision Care  Clean your incision with mild soap and water after 48 hours. Pat the area dry with a clean towel. You do not need a bandage unless otherwise instructed. Do not apply any ointments or creams to your incision. You may have skin glue on your incision. Do not peel it off. It will come off on its own in about one week. Your arm may swell a bit after surgery. To reduce swelling use pillows to elevate your arm so it is above your heart. Your doctor will tell you if you need to lightly wrap your arm with an ACE bandage.  Diet  Resume your normal diet. There are not special food restrictions following this procedure. In order to heal from your surgery, it is CRITICAL to get adequate nutrition. Your body requires vitamins, minerals, and protein. Vegetables are the best source of vitamins and minerals. Vegetables also provide the perfect balance of protein. Processed food has little nutritional value, so try to avoid this.  Medications  Resume taking all of your medications. If your incision is causing pain, you may take over-the counter pain relievers such as acetaminophen (Tylenol). If you were prescribed a stronger pain medication, please be aware these medications can cause nausea and constipation. Prevent  nausea by taking the medication with a snack or meal. Avoid constipation by drinking plenty of fluids and eating foods with high amount of fiber, such as fruits, vegetables, and grains.   Resume Coumadin/Lovenox as directed.  Follow up Your surgeon may want to see you in the office following your access surgery. If so, this will be arranged at the time of your surgery.  Please call us immediately for any of the following conditions:  Increased pain, redness, drainage (pus) from your incision site Fever of 101 degrees or higher Severe or worsening pain at your incision site Hand pain or numbness.  Reduce your risk of vascular disease:  Stop smoking. If you would like help, call [facility] at 1-800-QUIT-NOW (226)047-4509) or [place] at [place] your cholesterol Maintain a desired weight Control your diabetes Keep your blood pressure down  Dialysis  It will take several weeks to several months for your new dialysis access to be ready for use. Your surgeon will determine when it is okay to use it. Your nephrologist will continue to direct your dialysis. You can continue to use your Permcath until your new access is ready for use.   03/07/2018 Christine Santos [id number] [birth year]  Surgeon(s): Waynetta Sandy, MD  Procedure(s): Insertion left upper arm Artegraft  x Do not stick graft for 2 weeks    If you have any questions, please call the office at [phone].

## 2018-03-07 NOTE — Anesthesia Postprocedure Evaluation (Signed)
Anesthesia Post Note  Patient: Christine Santos  Procedure(s) Performed: CONVERSION TO  ARTERIOVENOUS Artegraft GRAFT ARM (Left Arm Upper)     Patient location during evaluation: PACU Anesthesia Type: MAC Level of consciousness: awake and alert Pain management: pain level controlled Vital Signs Assessment: post-procedure vital signs reviewed and stable Respiratory status: spontaneous breathing, nonlabored ventilation, respiratory function stable and patient connected to nasal cannula oxygen Cardiovascular status: stable and blood pressure returned to baseline Postop Assessment: no apparent nausea or vomiting Anesthetic complications: no    Last Vitals:  Vitals:   03/07/18 1100 03/07/18 1130  BP: 120/60 116/62  Pulse: 80 82  Resp: 17   Temp:    SpO2: 94% 94%    Last Pain:  Vitals:   03/07/18 1130  TempSrc:   PainSc: 0-No pain                 Tiajuana Amass

## 2018-03-07 NOTE — Transfer of Care (Signed)
Immediate Anesthesia Transfer of Care Note  Patient: Christine Santos  Procedure(s) Performed: CONVERSION TO  ARTERIOVENOUS Artegraft GRAFT ARM (Left Arm Upper)  Patient Location: PACU  Anesthesia Type:MAC  Level of Consciousness: drowsy  Airway & Oxygen Therapy: Patient Spontanous Breathing and Patient connected to nasal cannula oxygen  Post-op Assessment: Report given to RN and Post -op Vital signs reviewed and stable  Post vital signs: Reviewed and stable  Last Vitals:  Vitals Value Taken Time  BP 95/61 03/07/2018 10:13 AM  Temp    Pulse 78 03/07/2018 10:18 AM  Resp 15 03/07/2018 10:18 AM  SpO2 98 % 03/07/2018 10:18 AM  Vitals shown include unvalidated device data.  Last Pain:  Vitals:   03/07/18 0829  TempSrc:   PainSc: 0-No pain      Patients Stated Pain Goal: 3 (12/10/33 6701)  Complications: No apparent anesthesia complications

## 2018-03-07 NOTE — Anesthesia Preprocedure Evaluation (Signed)
Anesthesia Evaluation  Patient identified by MRN, date of birth, ID band Patient awake    Reviewed: Allergy & Precautions, NPO status , Patient's Chart, lab work & pertinent test results, reviewed documented beta blocker date and time   Airway Mallampati: II  TM Distance: >3 FB Neck ROM: Full    Dental  (+) Dental Advisory Given   Pulmonary neg pulmonary ROS,    breath sounds clear to auscultation       Cardiovascular hypertension, Pt. on medications and Pt. on home beta blockers + CAD, + Past MI, + CABG and + Peripheral Vascular Disease  + dysrhythmias Atrial Fibrillation  Rhythm:Regular Rate:Normal     Neuro/Psych CVA    GI/Hepatic Neg liver ROS, hiatal hernia, GERD  ,  Endo/Other  diabetes, Type 2  Renal/GU ESRF and DialysisRenal disease     Musculoskeletal   Abdominal   Peds  Hematology   Anesthesia Other Findings   Reproductive/Obstetrics                             Lab Results  Component Value Date   WBC 13.8 (H) 01/10/2018   HGB 10.5 (L) 01/10/2018   HCT 35.1 01/10/2018   MCV 99 (H) 01/10/2018   PLT 181 01/10/2018   Lab Results  Component Value Date   CREATININE 3.90 (H) 01/10/2018   BUN 33 (H) 01/10/2018   NA 138 01/10/2018   K 5.6 (H) 01/10/2018   CL 92 (L) 01/10/2018   CO2 29 01/10/2018    Anesthesia Physical Anesthesia Plan  ASA: III  Anesthesia Plan: MAC   Post-op Pain Management:    Induction: Intravenous  PONV Risk Score and Plan: 2 and Propofol infusion, Ondansetron and Treatment may vary due to age or medical condition  Airway Management Planned: Simple Face Mask  Additional Equipment:   Intra-op Plan:   Post-operative Plan:   Informed Consent: I have reviewed the patients History and Physical, chart, labs and discussed the procedure including the risks, benefits and alternatives for the proposed anesthesia with the patient or authorized  representative who has indicated his/her understanding and acceptance.       Plan Discussed with: CRNA  Anesthesia Plan Comments:         Anesthesia Quick Evaluation

## 2018-03-07 NOTE — Anesthesia Procedure Notes (Signed)
Procedure Name: MAC Performed by: Valda Favia, CRNA Pre-anesthesia Checklist: Patient identified, Emergency Drugs available, Suction available, Patient being monitored and Timeout performed Patient Re-evaluated:Patient Re-evaluated prior to induction Oxygen Delivery Method: Simple face mask Placement Confirmation: positive ETCO2 Dental Injury: Teeth and Oropharynx as per pre-operative assessment

## 2018-03-08 ENCOUNTER — Encounter (HOSPITAL_COMMUNITY): Payer: Self-pay | Admitting: Vascular Surgery

## 2018-03-08 DIAGNOSIS — N186 End stage renal disease: Secondary | ICD-10-CM | POA: Diagnosis not present

## 2018-03-08 DIAGNOSIS — Z4901 Encounter for fitting and adjustment of extracorporeal dialysis catheter: Secondary | ICD-10-CM | POA: Diagnosis not present

## 2018-03-08 DIAGNOSIS — N2581 Secondary hyperparathyroidism of renal origin: Secondary | ICD-10-CM | POA: Diagnosis not present

## 2018-03-08 DIAGNOSIS — D509 Iron deficiency anemia, unspecified: Secondary | ICD-10-CM | POA: Diagnosis not present

## 2018-03-08 DIAGNOSIS — D689 Coagulation defect, unspecified: Secondary | ICD-10-CM | POA: Diagnosis not present

## 2018-03-08 DIAGNOSIS — L03114 Cellulitis of left upper limb: Secondary | ICD-10-CM | POA: Diagnosis not present

## 2018-03-08 DIAGNOSIS — D631 Anemia in chronic kidney disease: Secondary | ICD-10-CM | POA: Diagnosis not present

## 2018-03-08 DIAGNOSIS — I482 Chronic atrial fibrillation, unspecified: Secondary | ICD-10-CM | POA: Diagnosis not present

## 2018-03-10 DIAGNOSIS — N186 End stage renal disease: Secondary | ICD-10-CM | POA: Diagnosis not present

## 2018-03-10 DIAGNOSIS — N2581 Secondary hyperparathyroidism of renal origin: Secondary | ICD-10-CM | POA: Diagnosis not present

## 2018-03-10 DIAGNOSIS — D509 Iron deficiency anemia, unspecified: Secondary | ICD-10-CM | POA: Diagnosis not present

## 2018-03-10 DIAGNOSIS — L03114 Cellulitis of left upper limb: Secondary | ICD-10-CM | POA: Diagnosis not present

## 2018-03-10 DIAGNOSIS — I482 Chronic atrial fibrillation, unspecified: Secondary | ICD-10-CM | POA: Diagnosis not present

## 2018-03-10 DIAGNOSIS — D631 Anemia in chronic kidney disease: Secondary | ICD-10-CM | POA: Diagnosis not present

## 2018-03-10 DIAGNOSIS — Z4901 Encounter for fitting and adjustment of extracorporeal dialysis catheter: Secondary | ICD-10-CM | POA: Diagnosis not present

## 2018-03-10 DIAGNOSIS — D689 Coagulation defect, unspecified: Secondary | ICD-10-CM | POA: Diagnosis not present

## 2018-03-12 ENCOUNTER — Ambulatory Visit: Payer: Medicare Other | Admitting: Pharmacist

## 2018-03-12 ENCOUNTER — Encounter (INDEPENDENT_AMBULATORY_CARE_PROVIDER_SITE_OTHER): Payer: Self-pay

## 2018-03-12 DIAGNOSIS — Z7901 Long term (current) use of anticoagulants: Secondary | ICD-10-CM

## 2018-03-12 DIAGNOSIS — I480 Paroxysmal atrial fibrillation: Secondary | ICD-10-CM

## 2018-03-12 DIAGNOSIS — E1121 Type 2 diabetes mellitus with diabetic nephropathy: Secondary | ICD-10-CM | POA: Diagnosis not present

## 2018-03-12 LAB — POCT INR: INR: 1.8 — AB (ref 2.0–3.0)

## 2018-03-12 NOTE — Patient Instructions (Signed)
Description   Take 1 tablet of warfarin today, then continue taking 1/2 tablet daily. Use your last 3 Lovenox shots. Recheck INR in 1 week. Call us with any medication changes or concerns [phone].

## 2018-03-13 DIAGNOSIS — L03114 Cellulitis of left upper limb: Secondary | ICD-10-CM | POA: Diagnosis not present

## 2018-03-13 DIAGNOSIS — D631 Anemia in chronic kidney disease: Secondary | ICD-10-CM | POA: Diagnosis not present

## 2018-03-13 DIAGNOSIS — N186 End stage renal disease: Secondary | ICD-10-CM | POA: Diagnosis not present

## 2018-03-13 DIAGNOSIS — D689 Coagulation defect, unspecified: Secondary | ICD-10-CM | POA: Diagnosis not present

## 2018-03-13 DIAGNOSIS — I482 Chronic atrial fibrillation, unspecified: Secondary | ICD-10-CM | POA: Diagnosis not present

## 2018-03-13 DIAGNOSIS — D509 Iron deficiency anemia, unspecified: Secondary | ICD-10-CM | POA: Diagnosis not present

## 2018-03-13 DIAGNOSIS — Z4901 Encounter for fitting and adjustment of extracorporeal dialysis catheter: Secondary | ICD-10-CM | POA: Diagnosis not present

## 2018-03-13 DIAGNOSIS — N2581 Secondary hyperparathyroidism of renal origin: Secondary | ICD-10-CM | POA: Diagnosis not present

## 2018-03-14 NOTE — Research (Signed)
STROKE~AF month 30 follow up completed. Loop Interrogated

## 2018-03-15 ENCOUNTER — Telehealth: Payer: Self-pay

## 2018-03-15 DIAGNOSIS — Z4901 Encounter for fitting and adjustment of extracorporeal dialysis catheter: Secondary | ICD-10-CM | POA: Diagnosis not present

## 2018-03-15 DIAGNOSIS — D631 Anemia in chronic kidney disease: Secondary | ICD-10-CM | POA: Diagnosis not present

## 2018-03-15 DIAGNOSIS — D509 Iron deficiency anemia, unspecified: Secondary | ICD-10-CM | POA: Diagnosis not present

## 2018-03-15 DIAGNOSIS — D689 Coagulation defect, unspecified: Secondary | ICD-10-CM | POA: Diagnosis not present

## 2018-03-15 DIAGNOSIS — N186 End stage renal disease: Secondary | ICD-10-CM | POA: Diagnosis not present

## 2018-03-15 DIAGNOSIS — I482 Chronic atrial fibrillation, unspecified: Secondary | ICD-10-CM | POA: Diagnosis not present

## 2018-03-15 DIAGNOSIS — L03114 Cellulitis of left upper limb: Secondary | ICD-10-CM | POA: Diagnosis not present

## 2018-03-15 DIAGNOSIS — N2581 Secondary hyperparathyroidism of renal origin: Secondary | ICD-10-CM | POA: Diagnosis not present

## 2018-03-15 NOTE — Telephone Encounter (Signed)
Samantha called from [facility] regarding pt needing to be seen for swollen, red arm. They started her on Vancomycin today. No available appts tomorrow or Monday. Offered first available appt on tues and Aldona Bar is going to try to get pt in for dialysis either Monday or Tues am before her appt here. She said she will explain to pt that if her arm increases in swelling, pain, and redness/warmth that she should report to the ED. Samantha verbalized understanding.

## 2018-03-17 DIAGNOSIS — Z4901 Encounter for fitting and adjustment of extracorporeal dialysis catheter: Secondary | ICD-10-CM | POA: Diagnosis not present

## 2018-03-17 DIAGNOSIS — N2581 Secondary hyperparathyroidism of renal origin: Secondary | ICD-10-CM | POA: Diagnosis not present

## 2018-03-17 DIAGNOSIS — L03114 Cellulitis of left upper limb: Secondary | ICD-10-CM | POA: Diagnosis not present

## 2018-03-17 DIAGNOSIS — D689 Coagulation defect, unspecified: Secondary | ICD-10-CM | POA: Diagnosis not present

## 2018-03-17 DIAGNOSIS — N186 End stage renal disease: Secondary | ICD-10-CM | POA: Diagnosis not present

## 2018-03-17 DIAGNOSIS — I482 Chronic atrial fibrillation, unspecified: Secondary | ICD-10-CM | POA: Diagnosis not present

## 2018-03-17 DIAGNOSIS — D509 Iron deficiency anemia, unspecified: Secondary | ICD-10-CM | POA: Diagnosis not present

## 2018-03-17 DIAGNOSIS — D631 Anemia in chronic kidney disease: Secondary | ICD-10-CM | POA: Diagnosis not present

## 2018-03-19 ENCOUNTER — Ambulatory Visit: Payer: Medicare Other | Admitting: *Deleted

## 2018-03-19 DIAGNOSIS — Z7901 Long term (current) use of anticoagulants: Secondary | ICD-10-CM

## 2018-03-19 DIAGNOSIS — I480 Paroxysmal atrial fibrillation: Secondary | ICD-10-CM | POA: Diagnosis not present

## 2018-03-19 LAB — POCT INR: INR: 1.6 — AB (ref 2.0–3.0)

## 2018-03-19 NOTE — Patient Instructions (Signed)
Description   Take 1 tablet of warfarin today, then start taking 1/2 tablet daily except 1 tablet on Fridays. Recheck INR in 1 week. Call us with any medication changes or concerns [phone].

## 2018-03-20 ENCOUNTER — Ambulatory Visit: Payer: Medicare Other | Admitting: Family

## 2018-03-20 DIAGNOSIS — Z4901 Encounter for fitting and adjustment of extracorporeal dialysis catheter: Secondary | ICD-10-CM | POA: Diagnosis not present

## 2018-03-20 DIAGNOSIS — L03114 Cellulitis of left upper limb: Secondary | ICD-10-CM | POA: Diagnosis not present

## 2018-03-20 DIAGNOSIS — N2581 Secondary hyperparathyroidism of renal origin: Secondary | ICD-10-CM | POA: Diagnosis not present

## 2018-03-20 DIAGNOSIS — D689 Coagulation defect, unspecified: Secondary | ICD-10-CM | POA: Diagnosis not present

## 2018-03-20 DIAGNOSIS — D631 Anemia in chronic kidney disease: Secondary | ICD-10-CM | POA: Diagnosis not present

## 2018-03-20 DIAGNOSIS — N186 End stage renal disease: Secondary | ICD-10-CM | POA: Diagnosis not present

## 2018-03-20 DIAGNOSIS — I482 Chronic atrial fibrillation, unspecified: Secondary | ICD-10-CM | POA: Diagnosis not present

## 2018-03-20 DIAGNOSIS — D509 Iron deficiency anemia, unspecified: Secondary | ICD-10-CM | POA: Diagnosis not present

## 2018-03-21 ENCOUNTER — Other Ambulatory Visit: Payer: Self-pay

## 2018-03-21 ENCOUNTER — Ambulatory Visit (INDEPENDENT_AMBULATORY_CARE_PROVIDER_SITE_OTHER): Payer: Self-pay | Admitting: Physician Assistant

## 2018-03-21 ENCOUNTER — Encounter: Payer: Self-pay | Admitting: Physician Assistant

## 2018-03-21 VITALS — BP 138/69 | HR 96 | Temp 97.5°F | Resp 20 | Ht 65.0 in | Wt 144.0 lb

## 2018-03-21 DIAGNOSIS — Z992 Dependence on renal dialysis: Secondary | ICD-10-CM

## 2018-03-21 DIAGNOSIS — N186 End stage renal disease: Secondary | ICD-10-CM

## 2018-03-21 NOTE — Progress Notes (Signed)
    Postoperative Access Visit   History of Present Illness   Christine Santos is a 73 y.o. year old female who presents for postoperative follow-up for revision of left brachiobasilic fistula with interposition Artegraft by Dr. Donzetta Matters on 03/07/2018.  She had some erythema and local edema to her antecubitum incision when evaluated by nephrology and thus she was given IV antibiotics over the past 3 dialysis treatments.  She believes this area of edema/fluid collection is resolving.  She is dialyzing via right IJ tunneled dialysis catheter on a Tuesday Thursday Saturday schedule under the care of Dr. Joelyn Oms.  She denies any fevers, chills, nausea/vomiting.     Physical Examination   Vitals:   03/21/18 1335  BP: 138/69  Pulse: 96  Resp: 20  Temp: (!) 97.5 F (36.4 C)  SpO2: 95%  Weight: 144 lb (65.3 kg)  Height: 5\' 5"  (1.651 m)   Body mass index is 23.96 kg/m.  left arm Incision is healed, small area of likely seroma near antecubitum incision, no frank sign of infection ; hand grip is 5/5, sensation in digits is intact, palpable thrill, bruit can be auscultated, palpable L radial pulse     Medical Decision Making   Christine Santos is a 73 y.o. year old female who presents s/p revision of left arm brachiobasilic fistula with interposition Artegraft by Dr. Donzetta Matters 2 weeks postop   Patent L arm AVG without signs or symptoms of steal syndrome  We will allow another couple weeks for inflammation and incision site edema/seroma to resolve  The patient's access will be ready for use 04/04/2018  The patient's tunneled dialysis catheter can be removed when Nephrology is comfortable with the performance of the AVG  The patient may follow up on a prn basis   Dagoberto Ligas PA-C [facility] [place] Office: [phone]  Clinic MD: Dr. Scot Dock

## 2018-03-22 DIAGNOSIS — N2581 Secondary hyperparathyroidism of renal origin: Secondary | ICD-10-CM | POA: Diagnosis not present

## 2018-03-22 DIAGNOSIS — Z4901 Encounter for fitting and adjustment of extracorporeal dialysis catheter: Secondary | ICD-10-CM | POA: Diagnosis not present

## 2018-03-22 DIAGNOSIS — N186 End stage renal disease: Secondary | ICD-10-CM | POA: Diagnosis not present

## 2018-03-22 DIAGNOSIS — D689 Coagulation defect, unspecified: Secondary | ICD-10-CM | POA: Diagnosis not present

## 2018-03-22 DIAGNOSIS — D631 Anemia in chronic kidney disease: Secondary | ICD-10-CM | POA: Diagnosis not present

## 2018-03-22 DIAGNOSIS — D509 Iron deficiency anemia, unspecified: Secondary | ICD-10-CM | POA: Diagnosis not present

## 2018-03-22 DIAGNOSIS — L03114 Cellulitis of left upper limb: Secondary | ICD-10-CM | POA: Diagnosis not present

## 2018-03-22 DIAGNOSIS — I482 Chronic atrial fibrillation, unspecified: Secondary | ICD-10-CM | POA: Diagnosis not present

## 2018-03-24 DIAGNOSIS — L03114 Cellulitis of left upper limb: Secondary | ICD-10-CM | POA: Diagnosis not present

## 2018-03-24 DIAGNOSIS — D689 Coagulation defect, unspecified: Secondary | ICD-10-CM | POA: Diagnosis not present

## 2018-03-24 DIAGNOSIS — Z4901 Encounter for fitting and adjustment of extracorporeal dialysis catheter: Secondary | ICD-10-CM | POA: Diagnosis not present

## 2018-03-24 DIAGNOSIS — E1122 Type 2 diabetes mellitus with diabetic chronic kidney disease: Secondary | ICD-10-CM | POA: Diagnosis not present

## 2018-03-24 DIAGNOSIS — I482 Chronic atrial fibrillation, unspecified: Secondary | ICD-10-CM | POA: Diagnosis not present

## 2018-03-24 DIAGNOSIS — D631 Anemia in chronic kidney disease: Secondary | ICD-10-CM | POA: Diagnosis not present

## 2018-03-24 DIAGNOSIS — D509 Iron deficiency anemia, unspecified: Secondary | ICD-10-CM | POA: Diagnosis not present

## 2018-03-24 DIAGNOSIS — N186 End stage renal disease: Secondary | ICD-10-CM | POA: Diagnosis not present

## 2018-03-24 DIAGNOSIS — N2581 Secondary hyperparathyroidism of renal origin: Secondary | ICD-10-CM | POA: Diagnosis not present

## 2018-03-24 DIAGNOSIS — Z992 Dependence on renal dialysis: Secondary | ICD-10-CM | POA: Diagnosis not present

## 2018-03-26 ENCOUNTER — Ambulatory Visit: Payer: Medicare Other

## 2018-03-26 ENCOUNTER — Ambulatory Visit: Payer: Medicare Other | Admitting: Family

## 2018-03-26 DIAGNOSIS — Z7901 Long term (current) use of anticoagulants: Secondary | ICD-10-CM

## 2018-03-26 DIAGNOSIS — I480 Paroxysmal atrial fibrillation: Secondary | ICD-10-CM

## 2018-03-26 LAB — POCT INR: INR: 2.3 (ref 2.0–3.0)

## 2018-03-26 NOTE — Patient Instructions (Signed)
Description   Continue on same dosage 1/2 tablet daily except 1 tablet on Fridays. Recheck INR in 2 weeks. Call us with any medication changes or concerns [phone].

## 2018-03-27 DIAGNOSIS — D631 Anemia in chronic kidney disease: Secondary | ICD-10-CM | POA: Diagnosis not present

## 2018-03-27 DIAGNOSIS — D689 Coagulation defect, unspecified: Secondary | ICD-10-CM | POA: Diagnosis not present

## 2018-03-27 DIAGNOSIS — I482 Chronic atrial fibrillation, unspecified: Secondary | ICD-10-CM | POA: Diagnosis not present

## 2018-03-27 DIAGNOSIS — N186 End stage renal disease: Secondary | ICD-10-CM | POA: Diagnosis not present

## 2018-03-27 DIAGNOSIS — N2581 Secondary hyperparathyroidism of renal origin: Secondary | ICD-10-CM | POA: Diagnosis not present

## 2018-03-27 DIAGNOSIS — Z4901 Encounter for fitting and adjustment of extracorporeal dialysis catheter: Secondary | ICD-10-CM | POA: Diagnosis not present

## 2018-03-29 DIAGNOSIS — Z4901 Encounter for fitting and adjustment of extracorporeal dialysis catheter: Secondary | ICD-10-CM | POA: Diagnosis not present

## 2018-03-29 DIAGNOSIS — I482 Chronic atrial fibrillation, unspecified: Secondary | ICD-10-CM | POA: Diagnosis not present

## 2018-03-29 DIAGNOSIS — N186 End stage renal disease: Secondary | ICD-10-CM | POA: Diagnosis not present

## 2018-03-29 DIAGNOSIS — D631 Anemia in chronic kidney disease: Secondary | ICD-10-CM | POA: Diagnosis not present

## 2018-03-29 DIAGNOSIS — N2581 Secondary hyperparathyroidism of renal origin: Secondary | ICD-10-CM | POA: Diagnosis not present

## 2018-03-29 DIAGNOSIS — D689 Coagulation defect, unspecified: Secondary | ICD-10-CM | POA: Diagnosis not present

## 2018-03-31 DIAGNOSIS — I482 Chronic atrial fibrillation, unspecified: Secondary | ICD-10-CM | POA: Diagnosis not present

## 2018-03-31 DIAGNOSIS — D689 Coagulation defect, unspecified: Secondary | ICD-10-CM | POA: Diagnosis not present

## 2018-03-31 DIAGNOSIS — N2581 Secondary hyperparathyroidism of renal origin: Secondary | ICD-10-CM | POA: Diagnosis not present

## 2018-03-31 DIAGNOSIS — N186 End stage renal disease: Secondary | ICD-10-CM | POA: Diagnosis not present

## 2018-03-31 DIAGNOSIS — D631 Anemia in chronic kidney disease: Secondary | ICD-10-CM | POA: Diagnosis not present

## 2018-03-31 DIAGNOSIS — Z4901 Encounter for fitting and adjustment of extracorporeal dialysis catheter: Secondary | ICD-10-CM | POA: Diagnosis not present

## 2018-04-03 DIAGNOSIS — N186 End stage renal disease: Secondary | ICD-10-CM | POA: Diagnosis not present

## 2018-04-03 DIAGNOSIS — I482 Chronic atrial fibrillation, unspecified: Secondary | ICD-10-CM | POA: Diagnosis not present

## 2018-04-03 DIAGNOSIS — Z4901 Encounter for fitting and adjustment of extracorporeal dialysis catheter: Secondary | ICD-10-CM | POA: Diagnosis not present

## 2018-04-03 DIAGNOSIS — N2581 Secondary hyperparathyroidism of renal origin: Secondary | ICD-10-CM | POA: Diagnosis not present

## 2018-04-03 DIAGNOSIS — D689 Coagulation defect, unspecified: Secondary | ICD-10-CM | POA: Diagnosis not present

## 2018-04-03 DIAGNOSIS — D631 Anemia in chronic kidney disease: Secondary | ICD-10-CM | POA: Diagnosis not present

## 2018-04-04 ENCOUNTER — Other Ambulatory Visit: Payer: Self-pay

## 2018-04-04 ENCOUNTER — Ambulatory Visit (INDEPENDENT_AMBULATORY_CARE_PROVIDER_SITE_OTHER): Payer: Medicare Other | Admitting: Podiatry

## 2018-04-04 DIAGNOSIS — B351 Tinea unguium: Secondary | ICD-10-CM

## 2018-04-04 DIAGNOSIS — M79675 Pain in left toe(s): Secondary | ICD-10-CM | POA: Diagnosis not present

## 2018-04-04 DIAGNOSIS — E1151 Type 2 diabetes mellitus with diabetic peripheral angiopathy without gangrene: Secondary | ICD-10-CM | POA: Diagnosis not present

## 2018-04-04 DIAGNOSIS — E1142 Type 2 diabetes mellitus with diabetic polyneuropathy: Secondary | ICD-10-CM | POA: Diagnosis not present

## 2018-04-04 DIAGNOSIS — M79674 Pain in right toe(s): Secondary | ICD-10-CM

## 2018-04-04 NOTE — Patient Instructions (Signed)
Diabetes Mellitus and Foot Care Foot care is an important part of your health, especially when you have diabetes. Diabetes may cause you to have problems because of poor blood flow (circulation) to your feet and legs, which can cause your skin to:  Become thinner and drier.  Break more easily.  Heal more slowly.  Peel and crack. You may also have nerve damage (neuropathy) in your legs and feet, causing decreased feeling in them. This means that you may not notice minor injuries to your feet that could lead to more serious problems. Noticing and addressing any potential problems early is the best way to prevent future foot problems. How to care for your feet Foot hygiene  Wash your feet daily with warm water and mild soap. Do not use hot water. Then, pat your feet and the areas between your toes until they are completely dry. Do not soak your feet as this can dry your skin.  Trim your toenails straight across. Do not dig under them or around the cuticle. File the edges of your nails with an emery board or nail file.  Apply a moisturizing lotion or petroleum jelly to the skin on your feet and to dry, brittle toenails. Use lotion that does not contain alcohol and is unscented. Do not apply lotion between your toes. Shoes and socks  Wear clean socks or stockings every day. Make sure they are not too tight. Do not wear knee-high stockings since they may decrease blood flow to your legs.  Wear shoes that fit properly and have enough cushioning. Always look in your shoes before you put them on to be sure there are no objects inside.  To break in new shoes, wear them for just a few hours a day. This prevents injuries on your feet. Wounds, scrapes, corns, and calluses  Check your feet daily for blisters, cuts, bruises, sores, and redness. If you cannot see the bottom of your feet, use a mirror or ask someone for help.  Do not cut corns or calluses or try to remove them with medicine.  If you  find a minor scrape, cut, or break in the skin on your feet, keep it and the skin around it clean and dry. You may clean these areas with mild soap and water. Do not clean the area with peroxide, alcohol, or iodine.  If you have a wound, scrape, corn, or callus on your foot, look at it several times a day to make sure it is healing and not infected. Check for: ? Redness, swelling, or pain. ? Fluid or blood. ? Warmth. ? Pus or a bad smell. General instructions  Do not cross your legs. This may decrease blood flow to your feet.  Do not use heating pads or hot water bottles on your feet. They may burn your skin. If you have lost feeling in your feet or legs, you may not know this is happening until it is too late.  Protect your feet from hot and cold by wearing shoes, such as at the beach or on hot pavement.  Schedule a complete foot exam at least once a year (annually) or more often if you have foot problems. If you have foot problems, report any cuts, sores, or bruises to your health care provider immediately. Contact a health care provider if:  You have a medical condition that increases your risk of infection and you have any cuts, sores, or bruises on your feet.  You have an injury that is not   healing.  You have redness on your legs or feet.  You feel burning or tingling in your legs or feet.  You have pain or cramps in your legs and feet.  Your legs or feet are numb.  Your feet always feel cold.  You have pain around a toenail. Get help right away if:  You have a wound, scrape, corn, or callus on your foot and: ? You have pain, swelling, or redness that gets worse. ? You have fluid or blood coming from the wound, scrape, corn, or callus. ? Your wound, scrape, corn, or callus feels warm to the touch. ? You have pus or a bad smell coming from the wound, scrape, corn, or callus. ? You have a fever. ? You have a red line going up your leg. Summary  Check your feet every day  for cuts, sores, red spots, swelling, and blisters.  Moisturize feet and legs daily.  Wear shoes that fit properly and have enough cushioning.  If you have foot problems, report any cuts, sores, or bruises to your health care provider immediately.  Schedule a complete foot exam at least once a year (annually) or more often if you have foot problems. This information is not intended to replace advice given to you by your health care provider. Make sure you discuss any questions you have with your health care provider. Document Released: 01/08/2000 Document Revised: 02/22/2017 Document Reviewed: 02/12/2016 Elsevier Interactive Patient Education  2019 Elsevier Inc.  Onychomycosis/Fungal Toenails  WHAT IS IT? An infection that lies within the keratin of your nail plate that is caused by a fungus.  WHY ME? Fungal infections affect all ages, sexes, races, and creeds.  There may be many factors that predispose you to a fungal infection such as age, coexisting medical conditions such as diabetes, or an autoimmune disease; stress, medications, fatigue, genetics, etc.  Bottom line: fungus thrives in a warm, moist environment and your shoes offer such a location.  IS IT CONTAGIOUS? Theoretically, yes.  You do not want to share shoes, nail clippers or files with someone who has fungal toenails.  Walking around barefoot in the same room or sleeping in the same bed is unlikely to transfer the organism.  It is important to realize, however, that fungus can spread easily from one nail to the next on the same foot.  HOW DO WE TREAT THIS?  There are several ways to treat this condition.  Treatment may depend on many factors such as age, medications, pregnancy, liver and kidney conditions, etc.  It is best to ask your doctor which options are available to you.  1. No treatment.   Unlike many other medical concerns, you can live with this condition.  However for many people this can be a painful condition and  may lead to ingrown toenails or a bacterial infection.  It is recommended that you keep the nails cut short to help reduce the amount of fungal nail. 2. Topical treatment.  These range from herbal remedies to prescription strength nail lacquers.  About 40-50% effective, topicals require twice daily application for approximately 9 to 12 months or until an entirely new nail has grown out.  The most effective topicals are medical grade medications available through physicians offices. 3. Oral antifungal medications.  With an 80-90% cure rate, the most common oral medication requires 3 to 4 months of therapy and stays in your system for a year as the new nail grows out.  Oral antifungal medications do require   blood work to make sure it is a safe drug for you.  A liver function panel will be performed prior to starting the medication and after the first month of treatment.  It is important to have the blood work performed to avoid any harmful side effects.  In general, this medication safe but blood work is required. 4. Laser Therapy.  This treatment is performed by applying a specialized laser to the affected nail plate.  This therapy is noninvasive, fast, and non-painful.  It is not covered by insurance and is therefore, out of pocket.  The results have been very good with a 80-95% cure rate.  [facility] is the only practice in the area to offer this therapy. 5. Permanent Nail Avulsion.  Removing the entire nail so that a new nail will not grow back. 

## 2018-04-05 DIAGNOSIS — D689 Coagulation defect, unspecified: Secondary | ICD-10-CM | POA: Diagnosis not present

## 2018-04-05 DIAGNOSIS — D631 Anemia in chronic kidney disease: Secondary | ICD-10-CM | POA: Diagnosis not present

## 2018-04-05 DIAGNOSIS — N186 End stage renal disease: Secondary | ICD-10-CM | POA: Diagnosis not present

## 2018-04-05 DIAGNOSIS — N2581 Secondary hyperparathyroidism of renal origin: Secondary | ICD-10-CM | POA: Diagnosis not present

## 2018-04-05 DIAGNOSIS — Z4901 Encounter for fitting and adjustment of extracorporeal dialysis catheter: Secondary | ICD-10-CM | POA: Diagnosis not present

## 2018-04-05 DIAGNOSIS — I482 Chronic atrial fibrillation, unspecified: Secondary | ICD-10-CM | POA: Diagnosis not present

## 2018-04-07 DIAGNOSIS — N186 End stage renal disease: Secondary | ICD-10-CM | POA: Diagnosis not present

## 2018-04-07 DIAGNOSIS — D689 Coagulation defect, unspecified: Secondary | ICD-10-CM | POA: Diagnosis not present

## 2018-04-07 DIAGNOSIS — D631 Anemia in chronic kidney disease: Secondary | ICD-10-CM | POA: Diagnosis not present

## 2018-04-07 DIAGNOSIS — Z4901 Encounter for fitting and adjustment of extracorporeal dialysis catheter: Secondary | ICD-10-CM | POA: Diagnosis not present

## 2018-04-07 DIAGNOSIS — I482 Chronic atrial fibrillation, unspecified: Secondary | ICD-10-CM | POA: Diagnosis not present

## 2018-04-07 DIAGNOSIS — N2581 Secondary hyperparathyroidism of renal origin: Secondary | ICD-10-CM | POA: Diagnosis not present

## 2018-04-09 ENCOUNTER — Ambulatory Visit: Payer: Medicare Other | Admitting: Pharmacist

## 2018-04-09 ENCOUNTER — Other Ambulatory Visit: Payer: Self-pay

## 2018-04-09 ENCOUNTER — Ambulatory Visit (INDEPENDENT_AMBULATORY_CARE_PROVIDER_SITE_OTHER): Payer: Medicare Other | Admitting: Family Medicine

## 2018-04-09 ENCOUNTER — Encounter: Payer: Self-pay | Admitting: Family Medicine

## 2018-04-09 VITALS — BP 126/62 | Temp 97.8°F | Ht 65.0 in | Wt 154.0 lb

## 2018-04-09 DIAGNOSIS — E1149 Type 2 diabetes mellitus with other diabetic neurological complication: Secondary | ICD-10-CM

## 2018-04-09 DIAGNOSIS — Z7901 Long term (current) use of anticoagulants: Secondary | ICD-10-CM | POA: Diagnosis not present

## 2018-04-09 DIAGNOSIS — I480 Paroxysmal atrial fibrillation: Secondary | ICD-10-CM

## 2018-04-09 DIAGNOSIS — E118 Type 2 diabetes mellitus with unspecified complications: Secondary | ICD-10-CM | POA: Diagnosis not present

## 2018-04-09 DIAGNOSIS — I10 Essential (primary) hypertension: Secondary | ICD-10-CM | POA: Diagnosis not present

## 2018-04-09 DIAGNOSIS — R5381 Other malaise: Secondary | ICD-10-CM

## 2018-04-09 LAB — POCT GLYCOSYLATED HEMOGLOBIN (HGB A1C): HbA1c, POC (prediabetic range): 6.4 % (ref 5.7–6.4)

## 2018-04-09 LAB — POCT INR: INR: 2 (ref 2.0–3.0)

## 2018-04-09 NOTE — Patient Instructions (Signed)
Description   Continue on same dosage 1/2 tablet daily except 1 tablet on Fridays. Recheck INR in 5 weeks. Call us with any medication changes or concerns [phone].

## 2018-04-09 NOTE — Progress Notes (Signed)
21 

## 2018-04-09 NOTE — Patient Instructions (Signed)
I am very pleased with progress you have made. See me again in three months if things continue to go well. Of course, see me sooner if problems. No changes.

## 2018-04-10 ENCOUNTER — Encounter: Payer: Self-pay | Admitting: Family Medicine

## 2018-04-10 DIAGNOSIS — D631 Anemia in chronic kidney disease: Secondary | ICD-10-CM | POA: Diagnosis not present

## 2018-04-10 DIAGNOSIS — I482 Chronic atrial fibrillation, unspecified: Secondary | ICD-10-CM | POA: Diagnosis not present

## 2018-04-10 DIAGNOSIS — N2581 Secondary hyperparathyroidism of renal origin: Secondary | ICD-10-CM | POA: Diagnosis not present

## 2018-04-10 DIAGNOSIS — Z4901 Encounter for fitting and adjustment of extracorporeal dialysis catheter: Secondary | ICD-10-CM | POA: Diagnosis not present

## 2018-04-10 DIAGNOSIS — D689 Coagulation defect, unspecified: Secondary | ICD-10-CM | POA: Diagnosis not present

## 2018-04-10 DIAGNOSIS — N186 End stage renal disease: Secondary | ICD-10-CM | POA: Diagnosis not present

## 2018-04-10 NOTE — Assessment & Plan Note (Signed)
Improving.

## 2018-04-10 NOTE — Assessment & Plan Note (Signed)
At goal.  No change 

## 2018-04-10 NOTE — Assessment & Plan Note (Signed)
Nicely at goal

## 2018-04-10 NOTE — Progress Notes (Signed)
Established Patient Office Visit  Subjective:  Patient ID: Christine Santos, female    DOB: 1945/12/13  Age: 73 y.o. MRN: [id number]  CC:  Chief Complaint  Patient presents with  . Diabetes    HPI Christine Santos presents for DM and other issues 1. Debility from severe chronic disease.  Strength is slowly improving as is appetite.  Drove here today.  Doing ADLs and IADLs now.  Feels stronger than last visit with me of 1/13 2. ESRD on dialysis.  Still not tolerating hard squeeze and still struggling with leg swelling right>left.  Has gained wt since last visit.  Feels some wt is still fluid but likely has also gained lean body wt 3. DM.  Well controled.  No hypoglycemia.  A1C today at goal 4. PAF aysmptomatic.  Past Medical History:  Diagnosis Date  . Anemia 08/29/2011  . Anxiety   . Arthritis    "legs" (01/22/2013)  . CAD (coronary artery disease)    s/p CABG in 1999  . Cataract   . Chronic kidney disease    Sees Dr [name]  . 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 Diastolic heart failure ([place])   . Dysrhythmia    PAF  . GERD (gastroesophageal reflux disease)   . Gout   . H/O hiatal hernia   . Headache    "years ago migraines, none in along time."  . Heart murmur   . Hyperlipidemia   . Hypertension   . IDDM (insulin dependent diabetes mellitus) ([place])    Type II  . Myocardial infarction ([place])    before 1995  . PAD (peripheral artery disease) ([place])   . Paroxysmal atrial fibrillation ([facility])    a. identified on ILR as part of StrokeAF study  . Peripheral vascular disease ([place])   . Pneumonia 11/2016  . 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 Still has balance issues 03/05/2018- vision improved, balance improved , back on walker since hospitaization,     Past Surgical History:  Procedure Laterality Date  . A/V FISTULAGRAM Left 12/25/2017   Procedure: A/V FISTULAGRAM;  Surgeon: Waynetta Sandy,  MD;  Location: [place];  Service: Cardiovascular;  Laterality: Left;  . A/V SHUNT INTERVENTION Left 12/25/2017   Procedure: A/V SHUNT INTERVENTION;  Surgeon: Waynetta Sandy, MD;  Location: [place] [facility];  Service: Cardiovascular;  Laterality: Left;  . ANGIOPLASTY / STENTING FEMORAL Right 09/2010   SFA/notes 11/25/2010 (01/22/2013)  . ANGIOPLASTY / STENTING FEMORAL Left 11/2010   SFA/notes 11/25/2010 (01/22/2013)  . ANGIOPLASTY / STENTING ILIAC     [name] 11/25/2010 (01/22/2013)  . AV FISTULA PLACEMENT Left 03/07/2018   Procedure: CONVERSION TO  ARTERIOVENOUS Artegraft GRAFT ARM;  Surgeon: Waynetta Sandy, MD;  Location: [facility];  Service: Vascular;  Laterality: Left;  . BASCILIC VEIN TRANSPOSITION Left 08/23/2017   Procedure: FIRST STAGE BASILIC VEIN TRANSPOSITION LEFT UPPER EXTREMITY;  Surgeon: [name], MD;  Location: [place];  Service: Vascular;  Laterality: Left;  . BASCILIC VEIN TRANSPOSITION Left 10/23/2017   Procedure: SECOND STAGE BASILIC VEIN TRANSPOSITION LEFT ARM;  Surgeon: [name], MD;  Location: [facility];  Service: Vascular;  Laterality: Left;  . CATARACT EXTRACTION W/ INTRAOCULAR LENS  IMPLANT, BILATERAL Bilateral ?2011  . CHOLECYSTECTOMY  1993  . CORONARY ARTERY BYPASS GRAFT  10/1994   "CABG X3"  . EP IMPLANTABLE DEVICE N/A 09/11/2015   Procedure:  Loop Recorder Insertion;  Surgeon: Thompson Grayer, MD;  Location: [place] [facility];  Service: Cardiovascular;  Laterality: N/A;  . HEEL SPUR EXCISION Bilateral 1970's  . IR FLUORO GUIDE CV LINE RIGHT  11/01/2017  . IR FLUORO GUIDE CV LINE RIGHT  11/05/2017  . IR US GUIDE VASC ACCESS RIGHT  11/01/2017  . IR US GUIDE VASC ACCESS RIGHT  11/05/2017  . LOWER EXTREMITY ANGIOGRAM N/A 01/22/2013   Procedure: LOWER EXTREMITY ANGIOGRAM;  Surgeon: [name], MD;  Location: [facility] C[facility];  Service: Cardiovascular;  Laterality: N/A;  . SHOULDER OPEN ROTATOR CUFF REPAIR Bilateral 1990's   "2 times  on 1 side; once on the other"  . TRANSLUMINAL ATHERECTOMY FEMORAL ARTERY Right 01/22/2013   & balloon  . TUBAL LIGATION  1980's  . VIDEO ASSISTED THORACOSCOPY (VATS)/EMPYEMA Left 11/29/2016   Procedure: VIDEO ASSISTED THORACOSCOPY (VATS)/EMPYEMA;  Surgeon: [name], MD;  Location: [facility];  Service: Thoracic;  Laterality: Left;  VIDEO ASSISTED THORACOSCOPY (VATS)/EMPYEMA    Family History  Problem Relation Age of Onset  . Heart disease Mother   . Heart attack Mother   . Hypertension Mother   . Kidney disease Mother   . Pneumonia Father   . Heart attack Father   . Hyperlipidemia Father   . Diabetes Sister   . Breast cancer Sister   . Arthritis Brother   . Diabetes Brother   . Heart attack Sister   . Obesity Sister   . Hypertension Sister   . Heart attack Sister   . Cancer Sister        breast  . Hyperlipidemia Sister   . Hypertension Sister   . Hyperlipidemia Sister   . Breast cancer Maternal Grandmother     Social History   Socioeconomic History  . Marital status: Divorced    Spouse name: Not on file  . Number of children: 2  . Years of education: 11  . Highest education level: Not on file  Occupational History  . Occupation: DISABILITY    Employer: OTHER    Comment: Worked at [employer]  . Financial resource strain: Not on file  . Food insecurity:    Worry: Not on file    Inability: Not on file  . Transportation needs:    Medical: Not on file    Non-medical: Not on file  Tobacco Use  . Smoking status: Never Smoker  . Smokeless tobacco: Never Used  Substance and Sexual Activity  . Alcohol use: No  . Drug use: No  . Sexual activity: Not Currently  Lifestyle  . Physical activity:    Days per week: Not on file    Minutes per session: Not on file  . Stress: Not on file  Relationships  . Social connections:    Talks on phone: Not on file    Gets together: Not on file    Attends religious service: Not on file    Active member of club  or organization: Not on file    Attends meetings of clubs or organizations: Not on file    Relationship status: Not on file  . Intimate partner violence:    Fear of current or ex partner: Not on file    Emotionally abused: Not on file    Physically abused: Not on file    Forced sexual activity: Not on file  Other Topics Concern  . Not on file  Social History Narrative   Health Care POA:  Emergency Contact: sister, [name] (h) [phone]   End of Life Plan:    Who lives with you: female friend   Any pets: none   Diet: Pt has a varied diet of protein, starch, vegetables   Exercise: Pt does not have any regular exercise routine.   Seatbelts: Pt reports wearing seatbelt when in vehicles.    Sun Exposure/Protection: Pt reports using sun screen.   Hobbies: going to lake, biking, blue grass music      Current Social History 08/16/2016        Patient lives with [name] (Significant other) in one level home 08/16/2016   Transportation: Patient has own vehicle 08/16/2016   Important Relationships [name] and sisters, [name] and Vaughan Basta 08/16/2016    Pets: None 08/16/2016   Education / Work:  12 th grade/ Disabled ([employer]) 08/16/2016   Interests / Fun: Going to [facility], out to eat with friends 08/16/2016   Current Stressors: Bilat foot pain (neuropathy) 08/16/2016   Religious / Personal Beliefs: Raised Methodist, goes to [employer] 08/16/2016   L. Ducatte, RN, BSN                                                                                                            Outpatient Medications Prior to Visit  Medication Sig Dispense Refill  . ACCU-CHEK AVIVA PLUS test strip TEST THREE TIMES A DAY 300 each 3  . acetaminophen (TYLENOL) 500 MG tablet Take 1,000 mg by mouth every 6 (six) hours as needed for moderate pain or headache.    . allopurinol (ZYLOPRIM) 100 MG tablet Take 0.5 tablets (50 mg total) by mouth daily. 30 tablet 0  . Ascorbic Acid (VITAMIN C) 1000 MG tablet  Take 1,000 mg by mouth daily.    . benzonatate (TESSALON) 100 MG capsule Take 1 capsule (100 mg total) by mouth 2 (two) times daily as needed for cough. 20 capsule 2  . carboxymethylcellulose (REFRESH PLUS) 0.5 % SOLN Place 1 drop into both eyes 3 (three) times daily.     Marland Kitchen enoxaparin (LOVENOX) 60 MG/0.6ML injection Inject 0.6 mLs (60 mg total) into the skin every 12 (twelve) hours. (Patient taking differently: Inject 60 mg into the skin daily. ) 10 Syringe 1  . ferric citrate (AURYXIA) 1 GM 210 MG(Fe) tablet Take 420 mg by mouth 3 (three) times daily with meals.    . ferrous sulfate 325 (65 FE) MG tablet Take 1 tablet (325 mg total) by mouth daily with breakfast.  3  . GNP GARLIC EXTRACT PO Take 1 tablet by mouth daily.     Marland Kitchen HUMALOG KWIKPEN 100 UNIT/ML KwikPen INJECT 5 UNITS UNDER THE SKIN THREE TIMES DAILY BEFORE MEALS 18 mL 3  . hydrALAZINE (APRESOLINE) 50 MG tablet TAKE 1 TABLET BY MOUTH THREE TIMES DAILY 270 tablet 3  . Insulin Glargine (LANTUS SOLOSTAR) 100 UNIT/ML Solostar Pen Inject 5-12 Units into the skin daily.    . Melatonin 10 MG CAPS Take 10 mg by mouth at bedtime.     Marland Kitchen  metoprolol succinate (TOPROL-XL) 25 MG 24 hr tablet Take 1 tablet (25 mg total) by mouth daily. 90 tablet 3  . Multiple Vitamins-Minerals (EYE VITAMINS PO) Take 1 capsule by mouth 2 (two) times daily. Taking one daily from her opthamologist (Dr. Herbert Deaner)     . Multiple Vitamins-Minerals (MULTIVITAMINS THER. W/MINERALS) TABS Take 1 tablet by mouth daily.      . Omega-3 Fatty Acids (FISH OIL) 1000 MG CAPS Take 1,000 mg by mouth daily.    . ondansetron (ZOFRAN) 4 MG tablet Take 1 tablet (4 mg total) by mouth every 8 (eight) hours as needed for nausea or vomiting. 20 tablet 1  . pantoprazole (PROTONIX) 40 MG tablet TAKE 1 TABLET BY MOUTH DAILY (Patient taking differently: Take 40 mg by mouth at bedtime. ) 90 tablet 3  . PARoxetine (PAXIL) 40 MG tablet Take 1 tablet (40 mg total) by mouth every morning. 90 tablet 3  .  rosuvastatin (CRESTOR) 20 MG tablet Take 20 mg by mouth at bedtime.    . torsemide (DEMADEX) 100 MG tablet Take 1 tablet (100 mg total) by mouth daily.    . traMADol (ULTRAM) 50 MG tablet Take 1 tablet (50 mg total) by mouth every 6 (six) hours as needed (for pain.). 360 tablet 1  . warfarin (COUMADIN) 5 MG tablet Take 1/2 to 1 tablet daily as directed by Coumadin clinic (Patient taking differently: Take 2.5-5 mg by mouth See admin instructions. Take 5 mg in the evening on Mondays, take 2.5 mg in the evening Tuesday through Sunday) 30 tablet 2   No facility-administered medications prior to visit.     Allergies  Allergen Reactions  . Cefepime Anaphylaxis    Throat swelling  . Amoxicillin Rash    Did it involve swelling of the face/tongue/throat, SOB, or low BP? No Did it involve sudden or severe rash/hives, skin peeling, or any reaction on the inside of your mouth or nose? No Did you need to seek medical attention at a hospital or doctor's office? No When did it last happen?10+ years If all above answers are "NO", may proceed with cephalosporin use.    . Codeine Phosphate Rash  . Penicillins Rash    Did it involve swelling of the face/tongue/throat, SOB, or low BP? No Did it involve sudden or severe rash/hives, skin peeling, or any reaction on the inside of your mouth or nose? No Did you need to seek medical attention at a hospital or doctor's office? No When did it last happen?10+ years If all above answers are "NO", may proceed with cephalosporin use.     Marland Kitchen Propoxyphene Rash    ROS Review of Systems    Objective:    Physical Exam  BP 126/62   Temp 97.8 F (36.6 C) (Oral)   Ht _0  (1.651 m)   Wt 154 lb (69.9 kg)   BMI 25.63 kg/m  Wt Readings from Last 3 Encounters:  04/09/18 154 lb (69.9 kg)  03/21/18 144 lb (65.3 kg)  03/07/18 144 lb (65.3 kg)   Wt noted Lungs clear Cardiac RRR with 1/6 SEM Ext 2+ edema right leg 1+ edema left.  Health  Maintenance Due  Topic Date Due  . OPHTHALMOLOGY EXAM  01/09/2018    There are no preventive care reminders to display for this patient.  Lab Results  Component Value Date   TSH 3.397 09/04/2015   Lab Results  Component Value Date   WBC 13.8 (H) 01/10/2018   HGB 13.3 03/07/2018  HCT 39.0 03/07/2018   MCV 99 (H) 01/10/2018   PLT 181 01/10/2018   Lab Results  Component Value Date   NA 140 03/07/2018   K 4.1 03/07/2018   CHLORIDE 104 09/06/2016   CO2 29 01/10/2018   GLUCOSE 100 (H) 03/07/2018   BUN 33 (H) 01/10/2018   CREATININE 3.90 (H) 01/10/2018   BILITOT 0.5 01/10/2018   ALKPHOS 183 (H) 01/10/2018   AST 40 01/10/2018   ALT 45 (H) 01/10/2018   PROT 5.0 (L) 01/10/2018   ALBUMIN 3.4 (L) 01/10/2018   CALCIUM 8.7 01/10/2018   ANIONGAP 10 12/20/2017   EGFR 13 (L) 09/06/2016   GFR 27.44 (L) 02/08/2013   Lab Results  Component Value Date   CHOL 169 09/04/2015   Lab Results  Component Value Date   HDL 32 (L) 09/04/2015   Lab Results  Component Value Date   LDLCALC 99 09/04/2015   Lab Results  Component Value Date   TRIG 188 (H) 09/04/2015   Lab Results  Component Value Date   CHOLHDL 5.3 09/04/2015   Lab Results  Component Value Date   HGBA1C 6.4 04/09/2018      Assessment & Plan:   Problem List Items Addressed This Visit    Diabetes mellitus type 2 with neurological manifestations ([place]) - Primary   Relevant Orders   POCT glycosylated hemoglobin (Hb A1C) (Completed)      No orders of the defined types were placed in this encounter.   Follow-up: No follow-ups on file.    [name], MD

## 2018-04-12 DIAGNOSIS — I482 Chronic atrial fibrillation, unspecified: Secondary | ICD-10-CM | POA: Diagnosis not present

## 2018-04-12 DIAGNOSIS — N2581 Secondary hyperparathyroidism of renal origin: Secondary | ICD-10-CM | POA: Diagnosis not present

## 2018-04-12 DIAGNOSIS — Z4901 Encounter for fitting and adjustment of extracorporeal dialysis catheter: Secondary | ICD-10-CM | POA: Diagnosis not present

## 2018-04-12 DIAGNOSIS — D689 Coagulation defect, unspecified: Secondary | ICD-10-CM | POA: Diagnosis not present

## 2018-04-12 DIAGNOSIS — D631 Anemia in chronic kidney disease: Secondary | ICD-10-CM | POA: Diagnosis not present

## 2018-04-12 DIAGNOSIS — N186 End stage renal disease: Secondary | ICD-10-CM | POA: Diagnosis not present

## 2018-04-14 DIAGNOSIS — Z4901 Encounter for fitting and adjustment of extracorporeal dialysis catheter: Secondary | ICD-10-CM | POA: Diagnosis not present

## 2018-04-14 DIAGNOSIS — N2581 Secondary hyperparathyroidism of renal origin: Secondary | ICD-10-CM | POA: Diagnosis not present

## 2018-04-14 DIAGNOSIS — D689 Coagulation defect, unspecified: Secondary | ICD-10-CM | POA: Diagnosis not present

## 2018-04-14 DIAGNOSIS — N186 End stage renal disease: Secondary | ICD-10-CM | POA: Diagnosis not present

## 2018-04-14 DIAGNOSIS — I482 Chronic atrial fibrillation, unspecified: Secondary | ICD-10-CM | POA: Diagnosis not present

## 2018-04-14 DIAGNOSIS — D631 Anemia in chronic kidney disease: Secondary | ICD-10-CM | POA: Diagnosis not present

## 2018-04-15 ENCOUNTER — Encounter: Payer: Self-pay | Admitting: Podiatry

## 2018-04-15 NOTE — Progress Notes (Signed)
Subjective: Patient presents today with diabetes and cc of painful, discolored, thick toenails which interfere with daily activities. Pain is aggravated when wearing enclosed shoe gear. Pain is getting progressively worse and relieved with periodic professional debridement.  Christine Resides, MD is her PCP. Last visit was 02/05/2018.  Patient is also on blood thinner, warfarin.  She has received her diabetic shoes and sle loves them. She voices no pain with shoe gear, no skin irritations.  Allergies  Allergen Reactions  . Cefepime Anaphylaxis    Throat swelling  . Amoxicillin Rash    Did it involve swelling of the face/tongue/throat, SOB, or low BP? No Did it involve sudden or severe rash/hives, skin peeling, or any reaction on the inside of your mouth or nose? No Did you need to seek medical attention at a hospital or doctor's office? No When did it last happen?10+ years If all above answers are "NO", may proceed with cephalosporin use.    . Codeine Phosphate Rash  . Penicillins Rash    Did it involve swelling of the face/tongue/throat, SOB, or low BP? No Did it involve sudden or severe rash/hives, skin peeling, or any reaction on the inside of your mouth or nose? No Did you need to seek medical attention at a hospital or doctor's office? No When did it last happen?10+ years If all above answers are "NO", may proceed with cephalosporin use.     Marland Kitchen Propoxyphene Rash   Objective:  Vascular Examination: Capillary refill time <3 seconds x 10 digits.  Dorsalis pedis pulses 1/4 b/l.  Posterior tibial pulses 0/4 b/l.  Digital hair absent x 10 digits.   Skin temperature gradient warm to cool  B/l.  No ischemic changes noted b/l.  Dermatological Examination: Skin mildly atrophic b/l.  Toenails 1-5 b/l discolored, thick, dystrophic with subungual debris and pain with palpation to nailbeds due to thickness of nails.  No skin irritations of boney prominences  noted b/l.  Musculoskeletal: Muscle strength 5/5 to all LE muscle groups  HAV with bunion b/l.  Hammertoe deformity 2nd digit b/l.  Extra depth shoes with plastizote inserts noted to be clean, dry and intact.  Neurological: Sensation diminished with 10 gram monofilament.  Vibratory sensation diminished b/l.  Assessment: 1. Painful onychomycosis toenails 1-5 b/l 2. NIDDM with Peripheral arterial disease 3. Diabetic neuropathy  Plan: 1. Continue practicing diabetic foot care principles daily. Literature dispensed. 2. Toenails 1-5 b/l were debrided in length and girth without iatrogenic bleeding. 3. Patient to continue soft, supportive shoe gear daily. 4. Patient to report any pedal injuries to medical professional immediately. 5. Follow up 3 months. 6. Patient/POA to call should there be a concern in the interim.

## 2018-04-17 DIAGNOSIS — Z4901 Encounter for fitting and adjustment of extracorporeal dialysis catheter: Secondary | ICD-10-CM | POA: Diagnosis not present

## 2018-04-17 DIAGNOSIS — D689 Coagulation defect, unspecified: Secondary | ICD-10-CM | POA: Diagnosis not present

## 2018-04-17 DIAGNOSIS — N2581 Secondary hyperparathyroidism of renal origin: Secondary | ICD-10-CM | POA: Diagnosis not present

## 2018-04-17 DIAGNOSIS — D631 Anemia in chronic kidney disease: Secondary | ICD-10-CM | POA: Diagnosis not present

## 2018-04-17 DIAGNOSIS — I482 Chronic atrial fibrillation, unspecified: Secondary | ICD-10-CM | POA: Diagnosis not present

## 2018-04-17 DIAGNOSIS — N186 End stage renal disease: Secondary | ICD-10-CM | POA: Diagnosis not present

## 2018-04-19 DIAGNOSIS — I482 Chronic atrial fibrillation, unspecified: Secondary | ICD-10-CM | POA: Diagnosis not present

## 2018-04-19 DIAGNOSIS — N186 End stage renal disease: Secondary | ICD-10-CM | POA: Diagnosis not present

## 2018-04-19 DIAGNOSIS — N2581 Secondary hyperparathyroidism of renal origin: Secondary | ICD-10-CM | POA: Diagnosis not present

## 2018-04-19 DIAGNOSIS — D631 Anemia in chronic kidney disease: Secondary | ICD-10-CM | POA: Diagnosis not present

## 2018-04-19 DIAGNOSIS — D689 Coagulation defect, unspecified: Secondary | ICD-10-CM | POA: Diagnosis not present

## 2018-04-19 DIAGNOSIS — Z4901 Encounter for fitting and adjustment of extracorporeal dialysis catheter: Secondary | ICD-10-CM | POA: Diagnosis not present

## 2018-04-23 DIAGNOSIS — Z452 Encounter for adjustment and management of vascular access device: Secondary | ICD-10-CM | POA: Diagnosis not present

## 2018-04-24 DIAGNOSIS — E1122 Type 2 diabetes mellitus with diabetic chronic kidney disease: Secondary | ICD-10-CM | POA: Diagnosis not present

## 2018-04-24 DIAGNOSIS — Z992 Dependence on renal dialysis: Secondary | ICD-10-CM | POA: Diagnosis not present

## 2018-04-24 DIAGNOSIS — D689 Coagulation defect, unspecified: Secondary | ICD-10-CM | POA: Diagnosis not present

## 2018-04-24 DIAGNOSIS — I482 Chronic atrial fibrillation, unspecified: Secondary | ICD-10-CM | POA: Diagnosis not present

## 2018-04-24 DIAGNOSIS — N2581 Secondary hyperparathyroidism of renal origin: Secondary | ICD-10-CM | POA: Diagnosis not present

## 2018-04-24 DIAGNOSIS — Z4901 Encounter for fitting and adjustment of extracorporeal dialysis catheter: Secondary | ICD-10-CM | POA: Diagnosis not present

## 2018-04-24 DIAGNOSIS — N186 End stage renal disease: Secondary | ICD-10-CM | POA: Diagnosis not present

## 2018-04-24 DIAGNOSIS — D631 Anemia in chronic kidney disease: Secondary | ICD-10-CM | POA: Diagnosis not present

## 2018-04-25 DIAGNOSIS — N2581 Secondary hyperparathyroidism of renal origin: Secondary | ICD-10-CM | POA: Diagnosis not present

## 2018-04-25 DIAGNOSIS — N186 End stage renal disease: Secondary | ICD-10-CM | POA: Diagnosis not present

## 2018-04-25 DIAGNOSIS — D631 Anemia in chronic kidney disease: Secondary | ICD-10-CM | POA: Diagnosis not present

## 2018-04-25 DIAGNOSIS — D689 Coagulation defect, unspecified: Secondary | ICD-10-CM | POA: Diagnosis not present

## 2018-04-27 DIAGNOSIS — N2581 Secondary hyperparathyroidism of renal origin: Secondary | ICD-10-CM | POA: Diagnosis not present

## 2018-04-27 DIAGNOSIS — D631 Anemia in chronic kidney disease: Secondary | ICD-10-CM | POA: Diagnosis not present

## 2018-04-27 DIAGNOSIS — N186 End stage renal disease: Secondary | ICD-10-CM | POA: Diagnosis not present

## 2018-04-27 DIAGNOSIS — D689 Coagulation defect, unspecified: Secondary | ICD-10-CM | POA: Diagnosis not present

## 2018-04-30 DIAGNOSIS — D689 Coagulation defect, unspecified: Secondary | ICD-10-CM | POA: Diagnosis not present

## 2018-04-30 DIAGNOSIS — D631 Anemia in chronic kidney disease: Secondary | ICD-10-CM | POA: Diagnosis not present

## 2018-04-30 DIAGNOSIS — N186 End stage renal disease: Secondary | ICD-10-CM | POA: Diagnosis not present

## 2018-04-30 DIAGNOSIS — N2581 Secondary hyperparathyroidism of renal origin: Secondary | ICD-10-CM | POA: Diagnosis not present

## 2018-05-02 DIAGNOSIS — D689 Coagulation defect, unspecified: Secondary | ICD-10-CM | POA: Diagnosis not present

## 2018-05-02 DIAGNOSIS — D631 Anemia in chronic kidney disease: Secondary | ICD-10-CM | POA: Diagnosis not present

## 2018-05-02 DIAGNOSIS — N186 End stage renal disease: Secondary | ICD-10-CM | POA: Diagnosis not present

## 2018-05-02 DIAGNOSIS — N2581 Secondary hyperparathyroidism of renal origin: Secondary | ICD-10-CM | POA: Diagnosis not present

## 2018-05-04 DIAGNOSIS — N186 End stage renal disease: Secondary | ICD-10-CM | POA: Diagnosis not present

## 2018-05-04 DIAGNOSIS — N2581 Secondary hyperparathyroidism of renal origin: Secondary | ICD-10-CM | POA: Diagnosis not present

## 2018-05-04 DIAGNOSIS — D689 Coagulation defect, unspecified: Secondary | ICD-10-CM | POA: Diagnosis not present

## 2018-05-04 DIAGNOSIS — D631 Anemia in chronic kidney disease: Secondary | ICD-10-CM | POA: Diagnosis not present

## 2018-05-07 DIAGNOSIS — N186 End stage renal disease: Secondary | ICD-10-CM | POA: Diagnosis not present

## 2018-05-07 DIAGNOSIS — D689 Coagulation defect, unspecified: Secondary | ICD-10-CM | POA: Diagnosis not present

## 2018-05-07 DIAGNOSIS — D631 Anemia in chronic kidney disease: Secondary | ICD-10-CM | POA: Diagnosis not present

## 2018-05-07 DIAGNOSIS — N2581 Secondary hyperparathyroidism of renal origin: Secondary | ICD-10-CM | POA: Diagnosis not present

## 2018-05-09 DIAGNOSIS — D631 Anemia in chronic kidney disease: Secondary | ICD-10-CM | POA: Diagnosis not present

## 2018-05-09 DIAGNOSIS — N2581 Secondary hyperparathyroidism of renal origin: Secondary | ICD-10-CM | POA: Diagnosis not present

## 2018-05-09 DIAGNOSIS — N186 End stage renal disease: Secondary | ICD-10-CM | POA: Diagnosis not present

## 2018-05-09 DIAGNOSIS — D689 Coagulation defect, unspecified: Secondary | ICD-10-CM | POA: Diagnosis not present

## 2018-05-11 DIAGNOSIS — N186 End stage renal disease: Secondary | ICD-10-CM | POA: Diagnosis not present

## 2018-05-11 DIAGNOSIS — N2581 Secondary hyperparathyroidism of renal origin: Secondary | ICD-10-CM | POA: Diagnosis not present

## 2018-05-11 DIAGNOSIS — D689 Coagulation defect, unspecified: Secondary | ICD-10-CM | POA: Diagnosis not present

## 2018-05-11 DIAGNOSIS — D631 Anemia in chronic kidney disease: Secondary | ICD-10-CM | POA: Diagnosis not present

## 2018-05-14 ENCOUNTER — Telehealth: Payer: Self-pay

## 2018-05-14 DIAGNOSIS — N2581 Secondary hyperparathyroidism of renal origin: Secondary | ICD-10-CM | POA: Diagnosis not present

## 2018-05-14 DIAGNOSIS — D689 Coagulation defect, unspecified: Secondary | ICD-10-CM | POA: Diagnosis not present

## 2018-05-14 DIAGNOSIS — N186 End stage renal disease: Secondary | ICD-10-CM | POA: Diagnosis not present

## 2018-05-14 DIAGNOSIS — D631 Anemia in chronic kidney disease: Secondary | ICD-10-CM | POA: Diagnosis not present

## 2018-05-14 NOTE — Telephone Encounter (Signed)

## 2018-05-15 ENCOUNTER — Other Ambulatory Visit: Payer: Self-pay

## 2018-05-15 ENCOUNTER — Ambulatory Visit (INDEPENDENT_AMBULATORY_CARE_PROVIDER_SITE_OTHER): Payer: Medicare Other | Admitting: *Deleted

## 2018-05-15 DIAGNOSIS — I480 Paroxysmal atrial fibrillation: Secondary | ICD-10-CM | POA: Diagnosis not present

## 2018-05-15 DIAGNOSIS — Z7901 Long term (current) use of anticoagulants: Secondary | ICD-10-CM | POA: Diagnosis not present

## 2018-05-15 LAB — POCT INR: INR: 1.3 — AB (ref 2.0–3.0)

## 2018-05-16 DIAGNOSIS — D631 Anemia in chronic kidney disease: Secondary | ICD-10-CM | POA: Diagnosis not present

## 2018-05-16 DIAGNOSIS — D689 Coagulation defect, unspecified: Secondary | ICD-10-CM | POA: Diagnosis not present

## 2018-05-16 DIAGNOSIS — N2581 Secondary hyperparathyroidism of renal origin: Secondary | ICD-10-CM | POA: Diagnosis not present

## 2018-05-16 DIAGNOSIS — N186 End stage renal disease: Secondary | ICD-10-CM | POA: Diagnosis not present

## 2018-05-18 DIAGNOSIS — D631 Anemia in chronic kidney disease: Secondary | ICD-10-CM | POA: Diagnosis not present

## 2018-05-18 DIAGNOSIS — D689 Coagulation defect, unspecified: Secondary | ICD-10-CM | POA: Diagnosis not present

## 2018-05-18 DIAGNOSIS — N186 End stage renal disease: Secondary | ICD-10-CM | POA: Diagnosis not present

## 2018-05-18 DIAGNOSIS — N2581 Secondary hyperparathyroidism of renal origin: Secondary | ICD-10-CM | POA: Diagnosis not present

## 2018-05-21 ENCOUNTER — Telehealth: Payer: Self-pay

## 2018-05-21 DIAGNOSIS — D689 Coagulation defect, unspecified: Secondary | ICD-10-CM | POA: Diagnosis not present

## 2018-05-21 DIAGNOSIS — N2581 Secondary hyperparathyroidism of renal origin: Secondary | ICD-10-CM | POA: Diagnosis not present

## 2018-05-21 DIAGNOSIS — D631 Anemia in chronic kidney disease: Secondary | ICD-10-CM | POA: Diagnosis not present

## 2018-05-21 DIAGNOSIS — N186 End stage renal disease: Secondary | ICD-10-CM | POA: Diagnosis not present

## 2018-05-21 NOTE — Telephone Encounter (Signed)
Mailbox was full unable to leave a msg for the prescreen

## 2018-05-22 ENCOUNTER — Ambulatory Visit (INDEPENDENT_AMBULATORY_CARE_PROVIDER_SITE_OTHER): Payer: Medicare Other | Admitting: Pharmacist

## 2018-05-22 ENCOUNTER — Other Ambulatory Visit: Payer: Self-pay

## 2018-05-22 DIAGNOSIS — Z7901 Long term (current) use of anticoagulants: Secondary | ICD-10-CM | POA: Diagnosis not present

## 2018-05-22 DIAGNOSIS — I480 Paroxysmal atrial fibrillation: Secondary | ICD-10-CM | POA: Diagnosis not present

## 2018-05-22 LAB — POCT INR: INR: 2.9 (ref 2.0–3.0)

## 2018-05-23 ENCOUNTER — Telehealth: Payer: Self-pay

## 2018-05-23 DIAGNOSIS — N2581 Secondary hyperparathyroidism of renal origin: Secondary | ICD-10-CM | POA: Diagnosis not present

## 2018-05-23 DIAGNOSIS — D631 Anemia in chronic kidney disease: Secondary | ICD-10-CM | POA: Diagnosis not present

## 2018-05-23 DIAGNOSIS — N186 End stage renal disease: Secondary | ICD-10-CM | POA: Diagnosis not present

## 2018-05-23 DIAGNOSIS — D689 Coagulation defect, unspecified: Secondary | ICD-10-CM | POA: Diagnosis not present

## 2018-05-23 NOTE — Telephone Encounter (Signed)

## 2018-05-24 ENCOUNTER — Other Ambulatory Visit: Payer: Self-pay

## 2018-05-24 ENCOUNTER — Ambulatory Visit (INDEPENDENT_AMBULATORY_CARE_PROVIDER_SITE_OTHER): Payer: Medicare Other | Admitting: Pharmacist

## 2018-05-24 DIAGNOSIS — N186 End stage renal disease: Secondary | ICD-10-CM | POA: Diagnosis not present

## 2018-05-24 DIAGNOSIS — I480 Paroxysmal atrial fibrillation: Secondary | ICD-10-CM

## 2018-05-24 DIAGNOSIS — E1122 Type 2 diabetes mellitus with diabetic chronic kidney disease: Secondary | ICD-10-CM | POA: Diagnosis not present

## 2018-05-24 DIAGNOSIS — Z992 Dependence on renal dialysis: Secondary | ICD-10-CM | POA: Diagnosis not present

## 2018-05-24 DIAGNOSIS — Z7901 Long term (current) use of anticoagulants: Secondary | ICD-10-CM

## 2018-05-24 LAB — POCT INR: INR: 1.4 — AB (ref 2.0–3.0)

## 2018-05-25 DIAGNOSIS — Z992 Dependence on renal dialysis: Secondary | ICD-10-CM | POA: Diagnosis not present

## 2018-05-25 DIAGNOSIS — N186 End stage renal disease: Secondary | ICD-10-CM | POA: Diagnosis not present

## 2018-05-25 DIAGNOSIS — N2581 Secondary hyperparathyroidism of renal origin: Secondary | ICD-10-CM | POA: Diagnosis not present

## 2018-05-25 DIAGNOSIS — M25552 Pain in left hip: Secondary | ICD-10-CM | POA: Diagnosis not present

## 2018-05-25 DIAGNOSIS — I482 Chronic atrial fibrillation, unspecified: Secondary | ICD-10-CM | POA: Diagnosis not present

## 2018-05-25 DIAGNOSIS — R52 Pain, unspecified: Secondary | ICD-10-CM | POA: Diagnosis not present

## 2018-05-25 DIAGNOSIS — D631 Anemia in chronic kidney disease: Secondary | ICD-10-CM | POA: Diagnosis not present

## 2018-05-25 DIAGNOSIS — T82858A Stenosis of vascular prosthetic devices, implants and grafts, initial encounter: Secondary | ICD-10-CM | POA: Diagnosis not present

## 2018-05-28 DIAGNOSIS — R52 Pain, unspecified: Secondary | ICD-10-CM | POA: Diagnosis not present

## 2018-05-28 DIAGNOSIS — M25552 Pain in left hip: Secondary | ICD-10-CM | POA: Diagnosis not present

## 2018-05-28 DIAGNOSIS — N186 End stage renal disease: Secondary | ICD-10-CM | POA: Diagnosis not present

## 2018-05-28 DIAGNOSIS — N2581 Secondary hyperparathyroidism of renal origin: Secondary | ICD-10-CM | POA: Diagnosis not present

## 2018-05-28 DIAGNOSIS — D631 Anemia in chronic kidney disease: Secondary | ICD-10-CM | POA: Diagnosis not present

## 2018-05-28 DIAGNOSIS — I482 Chronic atrial fibrillation, unspecified: Secondary | ICD-10-CM | POA: Diagnosis not present

## 2018-05-29 ENCOUNTER — Telehealth: Payer: Self-pay

## 2018-05-29 MED ORDER — APIXABAN 5 MG PO TABS: 5.0000 mg | ORAL_TABLET | Freq: Two times a day (BID) | ORAL | 5 refills | Status: DC

## 2018-05-29 NOTE — Addendum Note (Signed)
Addended by: Marcelle Overlie D on: 05/29/2018 11:37 AM   Modules accepted: Orders

## 2018-05-29 NOTE — Telephone Encounter (Signed)

## 2018-05-29 NOTE — Telephone Encounter (Signed)
Spoke with both Dr. Irish Lack and Dr. Joelyn Oms who were both ok with patient switching back to Eliquis. Patient made aware. Rx for Eliquis sent to pharmacy. Patient has appointment in coumadin clinic on 5/7. She will be given instructions on how to switch at that time.

## 2018-05-30 DIAGNOSIS — N2581 Secondary hyperparathyroidism of renal origin: Secondary | ICD-10-CM | POA: Diagnosis not present

## 2018-05-30 DIAGNOSIS — I482 Chronic atrial fibrillation, unspecified: Secondary | ICD-10-CM | POA: Diagnosis not present

## 2018-05-30 DIAGNOSIS — D631 Anemia in chronic kidney disease: Secondary | ICD-10-CM | POA: Diagnosis not present

## 2018-05-30 DIAGNOSIS — R52 Pain, unspecified: Secondary | ICD-10-CM | POA: Diagnosis not present

## 2018-05-30 DIAGNOSIS — M25552 Pain in left hip: Secondary | ICD-10-CM | POA: Diagnosis not present

## 2018-05-30 DIAGNOSIS — N186 End stage renal disease: Secondary | ICD-10-CM | POA: Diagnosis not present

## 2018-05-31 ENCOUNTER — Other Ambulatory Visit: Payer: Self-pay

## 2018-05-31 ENCOUNTER — Other Ambulatory Visit: Payer: Self-pay | Admitting: Family Medicine

## 2018-05-31 ENCOUNTER — Ambulatory Visit (INDEPENDENT_AMBULATORY_CARE_PROVIDER_SITE_OTHER): Payer: Medicare Other | Admitting: Pharmacist

## 2018-05-31 DIAGNOSIS — I480 Paroxysmal atrial fibrillation: Secondary | ICD-10-CM

## 2018-05-31 DIAGNOSIS — Z7901 Long term (current) use of anticoagulants: Secondary | ICD-10-CM | POA: Diagnosis not present

## 2018-05-31 DIAGNOSIS — E7800 Pure hypercholesterolemia, unspecified: Secondary | ICD-10-CM

## 2018-05-31 DIAGNOSIS — I2510 Atherosclerotic heart disease of native coronary artery without angina pectoris: Secondary | ICD-10-CM

## 2018-05-31 LAB — POCT INR: INR: 1.4 — AB (ref 2.0–3.0)

## 2018-06-01 DIAGNOSIS — N2581 Secondary hyperparathyroidism of renal origin: Secondary | ICD-10-CM | POA: Diagnosis not present

## 2018-06-01 DIAGNOSIS — D631 Anemia in chronic kidney disease: Secondary | ICD-10-CM | POA: Diagnosis not present

## 2018-06-01 DIAGNOSIS — N186 End stage renal disease: Secondary | ICD-10-CM | POA: Diagnosis not present

## 2018-06-01 DIAGNOSIS — R52 Pain, unspecified: Secondary | ICD-10-CM | POA: Diagnosis not present

## 2018-06-01 DIAGNOSIS — I482 Chronic atrial fibrillation, unspecified: Secondary | ICD-10-CM | POA: Diagnosis not present

## 2018-06-01 DIAGNOSIS — M25552 Pain in left hip: Secondary | ICD-10-CM | POA: Diagnosis not present

## 2018-06-04 DIAGNOSIS — M25552 Pain in left hip: Secondary | ICD-10-CM | POA: Diagnosis not present

## 2018-06-04 DIAGNOSIS — N186 End stage renal disease: Secondary | ICD-10-CM | POA: Diagnosis not present

## 2018-06-04 DIAGNOSIS — R52 Pain, unspecified: Secondary | ICD-10-CM | POA: Diagnosis not present

## 2018-06-04 DIAGNOSIS — N2581 Secondary hyperparathyroidism of renal origin: Secondary | ICD-10-CM | POA: Diagnosis not present

## 2018-06-04 DIAGNOSIS — D631 Anemia in chronic kidney disease: Secondary | ICD-10-CM | POA: Diagnosis not present

## 2018-06-04 DIAGNOSIS — I482 Chronic atrial fibrillation, unspecified: Secondary | ICD-10-CM | POA: Diagnosis not present

## 2018-06-06 ENCOUNTER — Other Ambulatory Visit: Payer: Self-pay

## 2018-06-06 ENCOUNTER — Encounter (HOSPITAL_COMMUNITY): Payer: Self-pay | Admitting: Emergency Medicine

## 2018-06-06 ENCOUNTER — Emergency Department (HOSPITAL_COMMUNITY): Payer: Medicare Other

## 2018-06-06 ENCOUNTER — Inpatient Hospital Stay (HOSPITAL_COMMUNITY)
Admission: EM | Admit: 2018-06-06 | Discharge: 2018-06-14 | DRG: 480 | Disposition: A | Discharge status: Rehabilitation Facility | Payer: Medicare Other | Attending: Family Medicine | Admitting: Family Medicine

## 2018-06-06 DIAGNOSIS — I5042 Chronic combined systolic (congestive) and diastolic (congestive) heart failure: Secondary | ICD-10-CM | POA: Diagnosis present

## 2018-06-06 DIAGNOSIS — D631 Anemia in chronic kidney disease: Secondary | ICD-10-CM | POA: Diagnosis present

## 2018-06-06 DIAGNOSIS — R52 Pain, unspecified: Secondary | ICD-10-CM | POA: Diagnosis not present

## 2018-06-06 DIAGNOSIS — E1122 Type 2 diabetes mellitus with diabetic chronic kidney disease: Secondary | ICD-10-CM | POA: Diagnosis present

## 2018-06-06 DIAGNOSIS — I248 Other forms of acute ischemic heart disease: Secondary | ICD-10-CM | POA: Diagnosis present

## 2018-06-06 DIAGNOSIS — S72001A Fracture of unspecified part of neck of right femur, initial encounter for closed fracture: Secondary | ICD-10-CM | POA: Diagnosis not present

## 2018-06-06 DIAGNOSIS — E1121 Type 2 diabetes mellitus with diabetic nephropathy: Secondary | ICD-10-CM | POA: Diagnosis not present

## 2018-06-06 DIAGNOSIS — M8580 Other specified disorders of bone density and structure, unspecified site: Secondary | ICD-10-CM | POA: Diagnosis present

## 2018-06-06 DIAGNOSIS — M25551 Pain in right hip: Secondary | ICD-10-CM | POA: Diagnosis not present

## 2018-06-06 DIAGNOSIS — S72141A Displaced intertrochanteric fracture of right femur, initial encounter for closed fracture: Secondary | ICD-10-CM | POA: Diagnosis not present

## 2018-06-06 DIAGNOSIS — E118 Type 2 diabetes mellitus with unspecified complications: Secondary | ICD-10-CM | POA: Diagnosis present

## 2018-06-06 DIAGNOSIS — E1151 Type 2 diabetes mellitus with diabetic peripheral angiopathy without gangrene: Secondary | ICD-10-CM | POA: Diagnosis present

## 2018-06-06 DIAGNOSIS — Z9049 Acquired absence of other specified parts of digestive tract: Secondary | ICD-10-CM

## 2018-06-06 DIAGNOSIS — G4700 Insomnia, unspecified: Secondary | ICD-10-CM | POA: Diagnosis present

## 2018-06-06 DIAGNOSIS — R5381 Other malaise: Secondary | ICD-10-CM | POA: Diagnosis not present

## 2018-06-06 DIAGNOSIS — Z85528 Personal history of other malignant neoplasm of kidney: Secondary | ICD-10-CM

## 2018-06-06 DIAGNOSIS — E785 Hyperlipidemia, unspecified: Secondary | ICD-10-CM | POA: Diagnosis present

## 2018-06-06 DIAGNOSIS — Z7901 Long term (current) use of anticoagulants: Secondary | ICD-10-CM

## 2018-06-06 DIAGNOSIS — I482 Chronic atrial fibrillation, unspecified: Secondary | ICD-10-CM | POA: Diagnosis present

## 2018-06-06 DIAGNOSIS — Z794 Long term (current) use of insulin: Secondary | ICD-10-CM | POA: Diagnosis not present

## 2018-06-06 DIAGNOSIS — N2581 Secondary hyperparathyroidism of renal origin: Secondary | ICD-10-CM | POA: Diagnosis not present

## 2018-06-06 DIAGNOSIS — K567 Ileus, unspecified: Secondary | ICD-10-CM

## 2018-06-06 DIAGNOSIS — Z856 Personal history of leukemia: Secondary | ICD-10-CM

## 2018-06-06 DIAGNOSIS — I132 Hypertensive heart and chronic kidney disease with heart failure and with stage 5 chronic kidney disease, or end stage renal disease: Secondary | ICD-10-CM | POA: Diagnosis not present

## 2018-06-06 DIAGNOSIS — E7800 Pure hypercholesterolemia, unspecified: Secondary | ICD-10-CM | POA: Diagnosis present

## 2018-06-06 DIAGNOSIS — E1142 Type 2 diabetes mellitus with diabetic polyneuropathy: Secondary | ICD-10-CM | POA: Diagnosis not present

## 2018-06-06 DIAGNOSIS — R0602 Shortness of breath: Secondary | ICD-10-CM | POA: Diagnosis not present

## 2018-06-06 DIAGNOSIS — S72041A Displaced fracture of base of neck of right femur, initial encounter for closed fracture: Secondary | ICD-10-CM | POA: Diagnosis not present

## 2018-06-06 DIAGNOSIS — I739 Peripheral vascular disease, unspecified: Secondary | ICD-10-CM | POA: Diagnosis not present

## 2018-06-06 DIAGNOSIS — R1110 Vomiting, unspecified: Secondary | ICD-10-CM | POA: Diagnosis not present

## 2018-06-06 DIAGNOSIS — C9110 Chronic lymphocytic leukemia of B-cell type not having achieved remission: Secondary | ICD-10-CM | POA: Diagnosis present

## 2018-06-06 DIAGNOSIS — J9601 Acute respiratory failure with hypoxia: Secondary | ICD-10-CM | POA: Diagnosis not present

## 2018-06-06 DIAGNOSIS — M25552 Pain in left hip: Secondary | ICD-10-CM | POA: Diagnosis not present

## 2018-06-06 DIAGNOSIS — N186 End stage renal disease: Secondary | ICD-10-CM

## 2018-06-06 DIAGNOSIS — I480 Paroxysmal atrial fibrillation: Secondary | ICD-10-CM | POA: Diagnosis not present

## 2018-06-06 DIAGNOSIS — Y92019 Unspecified place in single-family (private) house as the place of occurrence of the external cause: Secondary | ICD-10-CM

## 2018-06-06 DIAGNOSIS — E1140 Type 2 diabetes mellitus with diabetic neuropathy, unspecified: Secondary | ICD-10-CM | POA: Diagnosis present

## 2018-06-06 DIAGNOSIS — I2510 Atherosclerotic heart disease of native coronary artery without angina pectoris: Secondary | ICD-10-CM | POA: Diagnosis not present

## 2018-06-06 DIAGNOSIS — Z1159 Encounter for screening for other viral diseases: Secondary | ICD-10-CM | POA: Diagnosis not present

## 2018-06-06 DIAGNOSIS — S72001S Fracture of unspecified part of neck of right femur, sequela: Secondary | ICD-10-CM

## 2018-06-06 DIAGNOSIS — G4734 Idiopathic sleep related nonobstructive alveolar hypoventilation: Secondary | ICD-10-CM

## 2018-06-06 DIAGNOSIS — I4891 Unspecified atrial fibrillation: Secondary | ICD-10-CM | POA: Diagnosis not present

## 2018-06-06 DIAGNOSIS — Z951 Presence of aortocoronary bypass graft: Secondary | ICD-10-CM

## 2018-06-06 DIAGNOSIS — K2100 Gastro-esophageal reflux disease with esophagitis, without bleeding: Secondary | ICD-10-CM | POA: Diagnosis present

## 2018-06-06 DIAGNOSIS — Z8673 Personal history of transient ischemic attack (TIA), and cerebral infarction without residual deficits: Secondary | ICD-10-CM

## 2018-06-06 DIAGNOSIS — F419 Anxiety disorder, unspecified: Secondary | ICD-10-CM | POA: Diagnosis present

## 2018-06-06 DIAGNOSIS — R0902 Hypoxemia: Secondary | ICD-10-CM | POA: Diagnosis not present

## 2018-06-06 DIAGNOSIS — F329 Major depressive disorder, single episode, unspecified: Secondary | ICD-10-CM | POA: Diagnosis present

## 2018-06-06 DIAGNOSIS — Z419 Encounter for procedure for purposes other than remedying health state, unspecified: Secondary | ICD-10-CM

## 2018-06-06 DIAGNOSIS — I5022 Chronic systolic (congestive) heart failure: Secondary | ICD-10-CM | POA: Diagnosis present

## 2018-06-06 DIAGNOSIS — I495 Sick sinus syndrome: Secondary | ICD-10-CM | POA: Diagnosis present

## 2018-06-06 DIAGNOSIS — R000 Tachycardia, unspecified: Secondary | ICD-10-CM | POA: Diagnosis not present

## 2018-06-06 DIAGNOSIS — Z79899 Other long term (current) drug therapy: Secondary | ICD-10-CM

## 2018-06-06 DIAGNOSIS — Z992 Dependence on renal dialysis: Secondary | ICD-10-CM

## 2018-06-06 DIAGNOSIS — I252 Old myocardial infarction: Secondary | ICD-10-CM

## 2018-06-06 DIAGNOSIS — I10 Essential (primary) hypertension: Secondary | ICD-10-CM | POA: Diagnosis present

## 2018-06-06 DIAGNOSIS — Z609 Problem related to social environment, unspecified: Secondary | ICD-10-CM

## 2018-06-06 DIAGNOSIS — E11319 Type 2 diabetes mellitus with unspecified diabetic retinopathy without macular edema: Secondary | ICD-10-CM | POA: Diagnosis present

## 2018-06-06 DIAGNOSIS — M109 Gout, unspecified: Secondary | ICD-10-CM | POA: Diagnosis present

## 2018-06-06 DIAGNOSIS — W010XXA Fall on same level from slipping, tripping and stumbling without subsequent striking against object, initial encounter: Secondary | ICD-10-CM | POA: Diagnosis present

## 2018-06-06 DIAGNOSIS — S299XXA Unspecified injury of thorax, initial encounter: Secondary | ICD-10-CM | POA: Diagnosis not present

## 2018-06-06 LAB — BASIC METABOLIC PANEL
Anion gap: 13 (ref 5–15)
BUN: 24 mg/dL — ABNORMAL HIGH (ref 8–23)
CO2: 32 mmol/L (ref 22–32)
Calcium: 9 mg/dL (ref 8.9–10.3)
Chloride: 96 mmol/L — ABNORMAL LOW (ref 98–111)
Creatinine, Ser: 4.84 mg/dL — ABNORMAL HIGH (ref 0.44–1.00)
GFR calc Af Amer: 10 mL/min — ABNORMAL LOW (ref >60)
GFR calc non Af Amer: 8 mL/min — ABNORMAL LOW (ref >60)
Glucose, Bld: 93 mg/dL (ref 70–99)
Potassium: 4.3 mmol/L (ref 3.5–5.1)
Sodium: 141 mmol/L (ref 135–145)

## 2018-06-06 LAB — CBC WITH DIFFERENTIAL/PLATELET
Abs Immature Granulocytes: 0.09 10*3/uL — ABNORMAL HIGH (ref 0.00–0.07)
Basophils Absolute: 0 10*3/uL (ref 0.0–0.1)
Basophils Relative: 0 %
Eosinophils Absolute: 0.3 10*3/uL (ref 0.0–0.5)
Eosinophils Relative: 3 %
HCT: 38.4 % (ref 36.0–46.0)
Hemoglobin: 11.9 g/dL — ABNORMAL LOW (ref 12.0–15.0)
Immature Granulocytes: 1 %
Lymphocytes Relative: 18 %
Lymphs Abs: 2.2 10*3/uL (ref 0.7–4.0)
MCH: 30.7 pg (ref 26.0–34.0)
MCHC: 31 g/dL (ref 30.0–36.0)
MCV: 99.2 fL (ref 80.0–100.0)
Monocytes Absolute: 1.3 10*3/uL — ABNORMAL HIGH (ref 0.1–1.0)
Monocytes Relative: 10 %
Neutro Abs: 8.4 10*3/uL — ABNORMAL HIGH (ref 1.7–7.7)
Neutrophils Relative %: 68 %
Platelets: 143 10*3/uL — ABNORMAL LOW (ref 150–400)
RBC: 3.87 MIL/uL (ref 3.87–5.11)
RDW: 19.9 % — ABNORMAL HIGH (ref 11.5–15.5)
WBC: 12.4 10*3/uL — ABNORMAL HIGH (ref 4.0–10.5)
nRBC: 0 % (ref 0.0–0.2)

## 2018-06-06 LAB — PROTIME-INR
INR: 1.1 (ref 0.8–1.2)
Prothrombin Time: 14.1 seconds (ref 11.4–15.2)

## 2018-06-06 LAB — TYPE AND SCREEN
ABO/RH(D): O POS
Antibody Screen: NEGATIVE

## 2018-06-06 MED ORDER — FENTANYL CITRATE (PF) 100 MCG/2ML IJ SOLN
50.0000 ug | INTRAMUSCULAR | Status: DC | PRN
  Administered 2018-06-06: 50 ug via INTRAVENOUS
  Filled 2018-06-06: qty 2

## 2018-06-06 NOTE — ED Notes (Signed)
ED TO INPATIENT HANDOFF REPORT  ED Nurse Name and Phone #: [phone]  S Name/Age/Gender Christine Santos 73 y.o. female Room/Bed: 034C/034C  Code Status   Code Status: Prior  Home/SNF/Other Home Patient oriented to: self, place, time and situation Is this baseline? Yes   Triage Complete: Triage complete  Chief Complaint hip dislocation  Triage Note [facility]- pt had a fall at home. Pt having severe pain to right hip. Possible dislocation/fracture. Pt is on Eliquis. Pt is a MWF with left arm restriction. Pt received 150 mcg fentanyl.    128/54    Allergies Allergies  Allergen Reactions  . Cefepime Anaphylaxis    Throat swelling  . Amoxicillin Rash    Did it involve swelling of the face/tongue/throat, SOB, or low BP? No Did it involve sudden or severe rash/hives, skin peeling, or any reaction on the inside of your mouth or nose? No Did you need to seek medical attention at a hospital or doctor's office? No When did it last happen?10+ years If all above answers are "NO", may proceed with cephalosporin use.    . Codeine Phosphate Rash  . Penicillins Rash    Did it involve swelling of the face/tongue/throat, SOB, or low BP? No Did it involve sudden or severe rash/hives, skin peeling, or any reaction on the inside of your mouth or nose? No Did you need to seek medical attention at a hospital or doctor's office? No When did it last happen?10+ years If all above answers are "NO", may proceed with cephalosporin use.     Marland Kitchen Propoxyphene Rash    Level of Care/Admitting Diagnosis ED Disposition    ED Disposition Condition Comment   Admit  The patient appears reasonably stabilized for admission considering the current resources, flow, and capabilities available in the ED at this time, and I doubt any other [facility] requiring further screening and/or treatment in the ED prior to admission is  present.       B Medical/Surgery History Past Medical History:   Diagnosis Date  . Anemia 08/29/2011  . Anxiety   . Arthritis    "legs" (01/22/2013)  . CAD (coronary artery disease)    s/p CABG in 1999  . Cataract   . Chronic kidney disease    Sees Dr [name]  . 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 Diastolic heart failure ([place])   . Dysrhythmia    PAF  . GERD (gastroesophageal reflux disease)   . Gout   . H/O hiatal hernia   . Headache    "years ago migraines, none in along time."  . Heart murmur   . Hyperlipidemia   . Hypertension   . 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 Myocardial infarction ([place])    before 1995  . PAD (peripheral artery disease) ([place])   . Paroxysmal atrial fibrillation ([facility])    a. identified on ILR as part of StrokeAF study  . Peripheral vascular disease ([place])   . Pneumonia 11/2016  . 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 Still has balance issues 03/05/2018- vision improved, balance improved , back on walker since hospitaization,    Past Surgical History:  Procedure Laterality Date  . A/V FISTULAGRAM Left 12/25/2017   Procedure: A/V FISTULAGRAM;  Surgeon: Waynetta Sandy, MD;  Location: [facility];  Service: Cardiovascular;  Laterality: Left;  . A/V SHUNT INTERVENTION Left 12/25/2017  Procedure: A/V SHUNT INTERVENTION;  Surgeon: Waynetta Sandy, MD;  Location: [facility];  Service: Cardiovascular;  Laterality: Left;  . ANGIOPLASTY / STENTING FEMORAL Right 09/2010   SFA/notes 11/25/2010 (01/22/2013)  . ANGIOPLASTY / STENTING FEMORAL Left 11/2010   SFA/notes 11/25/2010 (01/22/2013)  . ANGIOPLASTY / STENTING ILIAC     [name] 11/25/2010 (01/22/2013)  . AV FISTULA PLACEMENT Left 03/07/2018   Procedure: CONVERSION TO  ARTERIOVENOUS Artegraft GRAFT ARM;  Surgeon: Waynetta Sandy, MD;  Location: [facility];  Service: Vascular;  Laterality: Left;  . BASCILIC VEIN TRANSPOSITION Left 08/23/2017    Procedure: FIRST STAGE BASILIC VEIN TRANSPOSITION LEFT UPPER EXTREMITY;  Surgeon: [name], MD;  Location: [facility];  Service: Vascular;  Laterality: Left;  . BASCILIC VEIN TRANSPOSITION Left 10/23/2017   Procedure: SECOND STAGE BASILIC VEIN TRANSPOSITION LEFT ARM;  Surgeon: [name], MD;  Location: [facility];  Service: Vascular;  Laterality: Left;  . CATARACT EXTRACTION W/ INTRAOCULAR LENS  IMPLANT, BILATERAL Bilateral ?2011  . CHOLECYSTECTOMY  1993  . CORONARY ARTERY BYPASS GRAFT  10/1994   "CABG X3"  . EP IMPLANTABLE DEVICE N/A 09/11/2015   Procedure: Loop Recorder Insertion;  Surgeon: Thompson Grayer, MD;  Location: [facility];  Service: Cardiovascular;  Laterality: N/A;  . HEEL SPUR EXCISION Bilateral 1970's  . IR FLUORO GUIDE CV LINE RIGHT  11/01/2017  . IR FLUORO GUIDE CV LINE RIGHT  11/05/2017  . IR US GUIDE VASC ACCESS RIGHT  11/01/2017  . IR US GUIDE VASC ACCESS RIGHT  11/05/2017  . LOWER EXTREMITY ANGIOGRAM N/A 01/22/2013   Procedure: LOWER EXTREMITY ANGIOGRAM;  Surgeon: [name], MD;  Location: [facility];  Service: Cardiovascular;  Laterality: N/A;  . SHOULDER OPEN ROTATOR CUFF REPAIR Bilateral 1990's   "2 times on 1 side; once on the other"  . TRANSLUMINAL ATHERECTOMY FEMORAL ARTERY Right 01/22/2013   & balloon  . TUBAL LIGATION  1980's  . VIDEO ASSISTED THORACOSCOPY (VATS)/EMPYEMA Left 11/29/2016   Procedure: VIDEO ASSISTED THORACOSCOPY (VATS)/EMPYEMA;  Surgeon: Prescott Gum, Collier Salina, MD;  Location: [facility];  Service: Thoracic;  Laterality: Left;  VIDEO ASSISTED THORACOSCOPY (VATS)/EMPYEMA     A IV Location/Drains/Wounds Patient Lines/Drains/Airways Status   Active Line/Drains/Airways    Name:   Placement date:   Placement time:   Site:   Days:   Peripheral IV 06/06/18 Right;Upper Forearm   06/06/18    2120    Forearm   less than 1   Fistula / Graft Left Upper arm Arteriovenous vein graft   03/07/18    0958    Upper arm   91   Hemodialysis Catheter  Right Internal jugular Permanent   11/05/17    0944    Internal jugular   213   External Urinary Catheter   12/05/16    1026    -   548   Incision (Closed) 11/29/16 Chest Left   11/29/16    1838     554   Incision (Closed) 08/23/17 Arm Left   08/23/17    1155     287   Incision (Closed) 10/23/17 Arm Left   10/23/17    1255     226   Incision (Closed) 03/07/18 Arm Left   03/07/18    0933     91   Pressure Injury Deep Tissue Injury - Purple or maroon localized area of discolored intact skin or blood-filled blister due to damage of underlying soft tissue from  pressure and/or shear. purple area with small skin tear and blisters noted to coccyx   -    -        Wound / Incision (Open or Dehisced) 10/27/17 Diabetic ulcer Tibial Distal;Posterior;Right   10/27/17    1730    Tibial   222          Intake/Output Last 24 hours No intake or output data in the 24 hours ending 06/06/18 2316  Labs/Imaging Results for orders placed or performed during the hospital encounter of 06/06/18 (from the past 48 hour(s))  Basic metabolic panel     Status: Abnormal   Collection Time: 06/06/18  8:59 PM  Result Value Ref Range   Sodium 141 135 - 145 mmol/L   Potassium 4.3 3.5 - 5.1 mmol/L   Chloride 96 (L) 98 - 111 mmol/L   CO2 32 22 - 32 mmol/L   Glucose, Bld 93 70 - 99 mg/dL   BUN 24 (H) 8 - 23 mg/dL   Creatinine, Ser 4.84 (H) 0.44 - 1.00 mg/dL   Calcium 9.0 8.9 - 10.3 mg/dL   GFR calc non Af Amer 8 (L) >60 mL/min   GFR calc Af Amer 10 (L) >60 mL/min   Anion gap 13 5 - 15    Comment: Performed at [facility], [address] [place], [place] [address]  CBC WITH DIFFERENTIAL     Status: Abnormal   Collection Time: 06/06/18  8:59 PM  Result Value Ref Range   WBC 12.4 (H) 4.0 - 10.5 K/uL   RBC 3.87 3.87 - 5.11 MIL/uL   Hemoglobin 11.9 (L) 12.0 - 15.0 g/dL   HCT 38.4 36.0 - 46.0 %   MCV 99.2 80.0 - 100.0 fL   MCH 30.7 26.0 - 34.0 pg   MCHC 31.0 30.0 - 36.0 g/dL   RDW 19.9 (H) 11.5 - 15.5 %    Platelets 143 (L) 150 - 400 K/uL   nRBC 0.0 0.0 - 0.2 %   Neutrophils Relative % 68 %   Neutro Abs 8.4 (H) 1.7 - 7.7 K/uL   Lymphocytes Relative 18 %   Lymphs Abs 2.2 0.7 - 4.0 K/uL   Monocytes Relative 10 %   Monocytes Absolute 1.3 (H) 0.1 - 1.0 K/uL   Eosinophils Relative 3 %   Eosinophils Absolute 0.3 0.0 - 0.5 K/uL   Basophils Relative 0 %   Basophils Absolute 0.0 0.0 - 0.1 K/uL   Immature Granulocytes 1 %   Abs Immature Granulocytes 0.09 (H) 0.00 - 0.07 K/uL    Comment: Performed at [facility], [address], [place], [place] [address]  Protime-INR     Status: None   Collection Time: 06/06/18  8:59 PM  Result Value Ref Range   Prothrombin Time 14.1 11.4 - 15.2 seconds   INR 1.1 0.8 - 1.2    Comment: (NOTE) INR goal varies based on device and disease states. Performed at [facility], [place] [address], [place], [place] [address]   Type and screen [place]     Status: None   Collection Time: 06/06/18  9:12 PM  Result Value Ref Range   ABO/RH(D) O POS    Antibody Screen NEG    Sample Expiration      06/09/2018,2359 Performed at [facility], [place] [address], [place], [place] [address]    *Note: Due to a large number of results and/or encounters for the requested time period,  some results have not been displayed. A complete set of results can be found in Results Review.   Dg Chest 1 View  Result Date: 06/06/2018 CLINICAL DATA:  Fall EXAM: CHEST  1 VIEW COMPARISON:  01/10/2018 FINDINGS: Moderate cardiomegaly. Remote median sternotomy and CABG. No focal airspace consolidation or pulmonary edema. No pleural effusion or pneumothorax. IMPRESSION: Cardiomegaly without focal airspace disease. Electronically Signed   By: Ulyses Jarred M.D.   On: 06/06/2018 22:25   Dg Hip Unilat With Pelvis 2-3 Views Right  Result Date: 06/06/2018 CLINICAL DATA:  Fall EXAM: DG HIP (WITH OR WITHOUT PELVIS) 2-3V RIGHT COMPARISON:  None. FINDINGS: Medially  angulated fracture at the base of the right femoral neck femoral head remains positioned in the acetabulum. No pelvic fracture. IMPRESSION: Medially angulated fracture of the base of the right femoral neck. Electronically Signed   By: Ulyses Jarred M.D.   On: 06/06/2018 22:21    Pending Labs Unresulted Labs (From admission, onward)    Start     Ordered   06/06/18 2232  SARS Coronavirus 2 (CEPHEID - Performed in [facility] hospital lab), Hosp Order  (Asymptomatic Patients Labs)  Once,   R    Question:  Rule Out  Answer:  Yes   06/06/18 2232          Vitals/Pain Today's Vitals   06/06/18 2045 06/06/18 2251  BP:  (!) 116/58  Pulse:  (!) 105  Resp:  16  Temp: 98.3 F (36.8 C)   TempSrc: Oral   SpO2:  98%  Weight: 71.7 kg   Height: 5\' 5"  (1.651 m)   PainSc: 5      Isolation Precautions No active isolations  Medications Medications  fentaNYL (SUBLIMAZE) injection 50 mcg (50 mcg Intravenous Given 06/06/18 2135)    Mobility walks with person assist High fall risk   Focused Assessments    R Recommendations: See Admitting Provider Note  Report given to:   Additional Notes: Right hip fracture

## 2018-06-06 NOTE — ED Notes (Signed)
Sister, [name], would llike an update when possible at [phone] Also friend, Abe People, who she lives with, at [phone]

## 2018-06-06 NOTE — ED Triage Notes (Signed)
[facility]- pt had a fall at home. Pt having severe pain to right hip. Possible dislocation/fracture. Pt is on Eliquis. Pt is a MWF with left arm restriction. Pt received 150 mcg fentanyl.    128/54

## 2018-06-06 NOTE — ED Notes (Signed)
Patient transported to X-ray 

## 2018-06-06 NOTE — H&P (Addendum)
[facility] Admission History and Physical Service Pager: [phone]  Patient name: NECOLA BLUESTEIN Medical record number: [id number] Date of birth: Jun 27, 1945 Age: 73 y.o. Gender: female  Primary Care Provider: [name], MD Consultants: Orthopedics Code Status: Full Code Emergency Contact: Abe People (roommate) [phone], Sister [name] [phone]  Chief Complaint: Right hip pain after a fall  Assessment and Plan: DORENE BRUNI is a 73 y.o. female presenting with right hip fracture after a fall at home. PMH is significant for atrial fibrillation on Eliquis, chronic anemia, ESRD on HD, T2DM on insulin, HTN, HLD, GERD, CHF, gout.  Right Femoral Fracture: acute, stable Patient presented after a fall from hom and found to have a medially angulated fracture of the base of the right femoral neck on hip x-ray. Denies head trauma or LOC. Right leg slightly internally rotated. Neurovascularly intact bilaterally. Unable to lift right leg off of bed due to pain. Patient notes improvement in pain after IV Fentanyl 22mcg in ED. Vital signs stable with known a-fib and HR 100.  Hemoglobin 11 (baseline 10-11). PT/INR 14.1/1.1. Dr. Erlinda Hong consulted in ED given her last Eliquis dose was this morning, surgery will likely be postponed until 5/15. No signs of acute significant bleeding into the joint space, no visible hematoma at the anatomical right hip. - Admit to surgical telemetry, attending Dr. Andria Frames - Continuous cardiac monitoring - Will keep patient NPO until official ortho recs - ortho consulted in ED, appreciated recommendations.  - pain control with IV Dilaudid 0.5mg  q4h PRN - scheduled Tylenol suppository q6 hr - AM PT/INR, CBC, BMP - PT/OT  - fall precautions  - Hold eliquis  - SCD's - Given ESRD, will hold fluids. Consider starting gentle fluids if remains NPO  - IV protonix 40mg   Prolonged QTc: QTc of 507 on EKG.  - AM EKG - avoid QT prolonging  agents  A-fib: chronic, stable EKG with notable A-fib, incomplete RBBB, and prolonged QT of 507. HR Home meds: Metoprolol XL 25mg  QD, Eliquis 5mg  QD. HAS-BLED score 3.  - Last Eliquis AM of 5/13 - Hold home Eliquis at this time - Follow up Ortho recs  - SCDs - AM EKG  Anemia of Chronic Kidney Disease: stable Hgb 11.9 (baseline 10-11). Denies any bleeding.  - continue to monitor - AM CBC  T2DM with diabetic neuropathy: Home meds: Lantus 12U QD and Humalog 5U TID with meals. - hold home meds - sSSI - CBG q4 hours while NPO  HTN: BP on admission: 169/94. Home meds: Hydralazine 50mg  QD, Metoprolol 25mg  QD - hold home meds while NPO - continue to monitor  H/o CHF: Last echo 2017 with EF 55-60%, mild LVH, calcified annulus, G1DF with elevated LV filling pressure. Home meds: Metoprolol XL 25mg  QD. Unsure if still on Torsemide.  - hold home meds while NPO - restart pending med reconciliation   ESRD on HD MWF: Last HD 5/13 without problem. Recently had left arm braciobasilic fistula with interposition artegraft by Dr. Donzetta Matters in Feb 2020. Bandage clean and intact. Had tunneled dialysis catheter removed on Feb as well. Electrolytes stable. -consult nephro in AM  HLD:  Last lipid panel 2017: Chol 169, HDL 32, LDL 99, Trig 180. Home meds: Rosuvastatin 20mg  QD - lipid panel - hold home meds while NPO  GERD:  Home meds: Protonix 40mg  QD - IV Protonix 40mg  QD  FEN/GI: NPO, IV Protonix 40mg   Prophylaxis: SCDs  Disposition: Pending ortho recs and surgery  History of  Present Illness:  MIKYLAH ACKROYD is a 73 y.o. female presenting with right hip fracture. She states she reached down to get something off the floor and fell. She typically uses a walker to get around and/or puts a hand on the wall to keep her balance. She was not using her walker when she fell. She fell backwards and landed on her bottom. She denies hitting her head or losing consciousness. She states she just lost her balance  and that is why she fell.   Takes HD on MWF, has not missed any days and got it earlier today.  In the ED, patient was hemodynamically stable and afebrile. BMP and CBC unremarkable with baseline hemoglobin of 11.9, Plt 143. PT/INR 14.1/1.1. Right hip x-ray with medially angulated fracture of the base of the right femoral neck. CXR without cardiopulmonary disease. COVID pending. Patient received IV Fentanyl 23mcg x 1 with improvement in pain. Dr. Erlinda Hong was consulted in ED and given patient had last eliquis dose on Am of 5/13, likely surgery on 5/15. Patient was admitted for further management of her acute hip fracture.  Review Of Systems: Per HPI with the following additions:  Review of Systems  Constitutional: Negative for chills, fever and malaise/fatigue.  Eyes: Negative for blurred vision and double vision.  Respiratory: Negative for shortness of breath.   Cardiovascular: Negative for chest pain and leg swelling.  Gastrointestinal: Negative for abdominal pain, diarrhea, heartburn, nausea and vomiting.  Genitourinary: Negative for dysuria, frequency and urgency.  Skin: Negative for rash.  Neurological: Negative for dizziness, loss of consciousness and headaches.    Patient Active Problem List   Diagnosis Date Noted  . High risk social situation 02/05/2018  . Enteritis due to Clostridium difficile 07/02/2017  . History of stroke 01/20/2017  . Physical deconditioning 12/30/2016  . Transaminitis 12/23/2016  . Paroxysmal atrial fibrillation ([place]) 10/22/2015  . Laryngopharyngeal reflux (LPR) 09/14/2015  . Diabetes mellitus type 2 with neurological manifestations ([place]) 02/26/2014  . Diabetic nephropathy ([place]) 05/23/2013  . Diabetic neuropathy ([place]) 11/30/2012  . Macular degeneration 07/04/2012  . Diabetic retinopathy ([place]) 03/20/2012  . Osteopenia 09/22/2011  . Anemia in chronic kidney disease 08/29/2011  . Gout 04/13/2011  . Stress incontinence, female 11/11/2010  . Anxiety 08/18/2010   . Chronic systolic heart failure ([place]) 02/18/2009  . SINUS BRADYCARDIA 10/29/2008  . Peripheral vascular disease ([place]) 06/18/2008  . ESRD (end stage renal disease) on dialysis ([place]) 07/13/2007  . Chronic lymphocytic leukemia ([place]) 10/13/2006  . Multiple complications of type II diabetes mellitus ([place]) 03/23/2006  . HYPERCHOLESTEROLEMIA 03/23/2006  . Essential hypertension 03/23/2006  . Coronary atherosclerosis 03/23/2006  . REFLUX ESOPHAGITIS 03/23/2006  . IRRITABLE BOWEL SYNDROME 03/23/2006  . CYSTOCELE/RECTOCELE/PROLAPSE,UNSPEC. 03/23/2006  . Osteoarthritis, multiple sites 03/23/2006    Past Medical History: Past Medical History:  Diagnosis Date  . Anemia 08/29/2011  . Anxiety   . Arthritis    "legs" (01/22/2013)  . CAD (coronary artery disease)    s/p CABG in 1999  . Cataract   . Chronic kidney disease    Sees Dr [name]  . 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 Diastolic heart failure ([place])   . Dysrhythmia    PAF  . GERD (gastroesophageal reflux disease)   . Gout   . H/O hiatal hernia   . Headache    "years ago migraines, none in along time."  . Heart murmur   . Hyperlipidemia   . Hypertension   . IDDM (  insulin dependent diabetes mellitus) (HCC)    Type II  . Myocardial infarction ([place])    before 1995  . PAD (peripheral artery disease) ([place])   . Paroxysmal atrial fibrillation (HCC)    a. identified on ILR as part of StrokeAF study  . Peripheral vascular disease ([place])   . Pneumonia 11/2016  . Stroke (cerebrum) ([place]) 08/2015   "seeing double for 2 weeks" vision normal now. Still has balance issues 03/05/2018- vision improved, balance improved , back on walker since hospitaization,     Past Surgical History: Past Surgical History:  Procedure Laterality Date  . A/V FISTULAGRAM Left 12/25/2017   Procedure: A/V FISTULAGRAM;  Surgeon: Waynetta Sandy, MD;  Location: [place] [facility];  Service: Cardiovascular;  Laterality:  Left;  . A/V SHUNT INTERVENTION Left 12/25/2017   Procedure: A/V SHUNT INTERVENTION;  Surgeon: Waynetta Sandy, MD;  Location: [place] [facility];  Service: Cardiovascular;  Laterality: Left;  . ANGIOPLASTY / STENTING FEMORAL Right 09/2010   SFA/notes 11/25/2010 (01/22/2013)  . ANGIOPLASTY / STENTING FEMORAL Left 11/2010   SFA/notes 11/25/2010 (01/22/2013)  . ANGIOPLASTY / STENTING ILIAC     [name] 11/25/2010 (01/22/2013)  . AV FISTULA PLACEMENT Left 03/07/2018   Procedure: CONVERSION TO  ARTERIOVENOUS Artegraft GRAFT ARM;  Surgeon: Waynetta Sandy, MD;  Location: [place] [facility];  Service: Vascular;  Laterality: Left;  . BASCILIC VEIN TRANSPOSITION Left 08/23/2017   Procedure: FIRST STAGE BASILIC VEIN TRANSPOSITION LEFT UPPER EXTREMITY;  Surgeon: [name], MD;  Location: [place] [place];  Service: Vascular;  Laterality: Left;  . BASCILIC VEIN TRANSPOSITION Left 10/23/2017   Procedure: SECOND STAGE BASILIC VEIN TRANSPOSITION LEFT ARM;  Surgeon: [name], MD;  Location: [place];  Service: Vascular;  Laterality: Left;  . CATARACT EXTRACTION W/ INTRAOCULAR LENS  IMPLANT, BILATERAL Bilateral ?2011  . CHOLECYSTECTOMY  1993  . CORONARY ARTERY BYPASS GRAFT  10/1994   "CABG X3"  . EP IMPLANTABLE DEVICE N/A 09/11/2015   Procedure: Loop Recorder Insertion;  Surgeon: Thompson Grayer, MD;  Location: [place] [facility];  Service: Cardiovascular;  Laterality: N/A;  . HEEL SPUR EXCISION Bilateral 1970's  . IR FLUORO GUIDE CV LINE RIGHT  11/01/2017  . IR FLUORO GUIDE CV LINE RIGHT  11/05/2017  . IR US GUIDE VASC ACCESS RIGHT  11/01/2017  . IR US GUIDE VASC ACCESS RIGHT  11/05/2017  . LOWER EXTREMITY ANGIOGRAM N/A 01/22/2013   Procedure: LOWER EXTREMITY ANGIOGRAM;  Surgeon: [name], MD;  Location: [facility];  Service: Cardiovascular;  Laterality: N/A;  . SHOULDER OPEN ROTATOR CUFF REPAIR Bilateral 1990's   "2 times on 1 side; once on the other"  . TRANSLUMINAL ATHERECTOMY FEMORAL ARTERY  Right 01/22/2013   & balloon  . TUBAL LIGATION  1980's  . VIDEO ASSISTED THORACOSCOPY (VATS)/EMPYEMA Left 11/29/2016   Procedure: VIDEO ASSISTED THORACOSCOPY (VATS)/EMPYEMA;  Surgeon: [name], MD;  Location: [facility];  Service: Thoracic;  Laterality: Left;  VIDEO ASSISTED THORACOSCOPY (VATS)/EMPYEMA    Social History: Social History   Tobacco Use  . Smoking status: Never Smoker  . Smokeless tobacco: Never Used  Substance Use Topics  . Alcohol use: No  . Drug use: No   Additional social history: None  Please also refer to relevant sections of EMR.  Family History: Family History  Problem Relation Age of Onset  . Heart disease Mother   . Heart attack Mother   . Hypertension Mother   . Kidney disease Mother   .  Pneumonia Father   . Heart attack Father   . Hyperlipidemia Father   . Diabetes Sister   . Breast cancer Sister   . Arthritis Brother   . Diabetes Brother   . Heart attack Sister   . Obesity Sister   . Hypertension Sister   . Heart attack Sister   . Cancer Sister        breast  . Hyperlipidemia Sister   . Hypertension Sister   . Hyperlipidemia Sister   . Breast cancer Maternal Grandmother     Allergies and Medications: Allergies  Allergen Reactions  . Cefepime Anaphylaxis    Throat swelling  . Amoxicillin Rash    Did it involve swelling of the face/tongue/throat, SOB, or low BP? No Did it involve sudden or severe rash/hives, skin peeling, or any reaction on the inside of your mouth or nose? No Did you need to seek medical attention at a hospital or doctor's office? No When did it last happen?10+ years If all above answers are "NO", may proceed with cephalosporin use.    . Codeine Phosphate Rash  . Penicillins Rash    Did it involve swelling of the face/tongue/throat, SOB, or low BP? No Did it involve sudden or severe rash/hives, skin peeling, or any reaction on the inside of your mouth or nose? No Did you need to seek medical attention at  a hospital or doctor's office? No When did it last happen?10+ years If all above answers are "NO", may proceed with cephalosporin use.     Marland Kitchen Propoxyphene Rash   No current facility-administered medications on file prior to encounter.    Current Outpatient Medications on File Prior to Encounter  Medication Sig Dispense Refill  . ACCU-CHEK AVIVA PLUS test strip TEST THREE TIMES A DAY 300 each 3  . acetaminophen (TYLENOL) 500 MG tablet Take 1,000 mg by mouth every 6 (six) hours as needed for moderate pain or headache.    . allopurinol (ZYLOPRIM) 100 MG tablet Take 0.5 tablets (50 mg total) by mouth daily. 30 tablet 0  . apixaban (ELIQUIS) 5 MG TABS tablet Take 1 tablet (5 mg total) by mouth 2 (two) times daily. 60 tablet 5  . Ascorbic Acid (VITAMIN C) 1000 MG tablet Take 1,000 mg by mouth daily.    . benzonatate (TESSALON) 100 MG capsule Take 1 capsule (100 mg total) by mouth 2 (two) times daily as needed for cough. 20 capsule 2  . carboxymethylcellulose (REFRESH PLUS) 0.5 % SOLN Place 1 drop into both eyes 3 (three) times daily.     Marland Kitchen enoxaparin (LOVENOX) 60 MG/0.6ML injection Inject 0.6 mLs (60 mg total) into the skin every 12 (twelve) hours. (Patient taking differently: Inject 60 mg into the skin daily. ) 10 Syringe 1  . ferric citrate (AURYXIA) 1 GM 210 MG(Fe) tablet Take 420 mg by mouth 3 (three) times daily with meals.    . ferrous sulfate 325 (65 FE) MG tablet Take 1 tablet (325 mg total) by mouth daily with breakfast.  3  . GNP GARLIC EXTRACT PO Take 1 tablet by mouth daily.     Marland Kitchen HUMALOG KWIKPEN 100 UNIT/ML KwikPen INJECT 5 UNITS UNDER THE SKIN THREE TIMES DAILY BEFORE MEALS 18 mL 3  . hydrALAZINE (APRESOLINE) 50 MG tablet TAKE 1 TABLET BY MOUTH THREE TIMES DAILY 270 tablet 3  . Insulin Glargine (LANTUS SOLOSTAR) 100 UNIT/ML Solostar Pen Inject 5-12 Units into the skin daily.    . Lanthanum Carbonate (FOSRENOL PO) Take by  mouth 3 (three) times daily with meals.    . Melatonin  10 MG CAPS Take 10 mg by mouth at bedtime.     . metoprolol succinate (TOPROL-XL) 25 MG 24 hr tablet Take 1 tablet (25 mg total) by mouth daily. 90 tablet 3  . Multiple Vitamins-Minerals (EYE VITAMINS PO) Take 1 capsule by mouth 2 (two) times daily. Taking one daily from her opthamologist (Dr. Herbert Deaner)     . Multiple Vitamins-Minerals (MULTIVITAMINS THER. W/MINERALS) TABS Take 1 tablet by mouth daily.      . Omega-3 Fatty Acids (FISH OIL) 1000 MG CAPS Take 1,000 mg by mouth daily.    . ondansetron (ZOFRAN) 4 MG tablet Take 1 tablet (4 mg total) by mouth every 8 (eight) hours as needed for nausea or vomiting. 20 tablet 1  . pantoprazole (PROTONIX) 40 MG tablet TAKE 1 TABLET BY MOUTH DAILY (Patient taking differently: Take 40 mg by mouth at bedtime. ) 90 tablet 3  . PARoxetine (PAXIL) 40 MG tablet Take 1 tablet (40 mg total) by mouth every morning. 90 tablet 3  . rosuvastatin (CRESTOR) 20 MG tablet TAKE 1 TABLET(20 MG) BY MOUTH DAILY 90 tablet 3  . torsemide (DEMADEX) 100 MG tablet Take 1 tablet (100 mg total) by mouth daily.    . traMADol (ULTRAM) 50 MG tablet Take 1 tablet (50 mg total) by mouth every 6 (six) hours as needed (for pain.). 360 tablet 1    Objective: Temp 98.3 F (36.8 C) (Oral)   Ht 5\' 5"  (1.651 m)   Wt 71.7 kg   BMI 26.29 kg/m  Exam: General: pleasant elderly caucasian woman, well nourished, well developed, in no acute distress with non-toxic appearance, lying comfortably in ED bed HEENT: normocephalic, atraumatic, moist mucous membranes, oropharynx without erythema or exudate, PERRL Neck: supple, normal ROM CV: irregularly irregular and fast, harsh murmur along left upper sternal border, 1+ pitting edema at ankles bilaterally, 2+ radial and pedal pulses bilaterally Lungs: clear to auscultation bilaterally with normal work of breathing on RA Abdomen: soft, non-tender, non-distended, normoactive bowel sounds Skin: warm, dry, small area of erythema on right anterior LE without  signs of infection and no increased warmth Extremities: warm and well perfused, right LE internally rotated MSK: unable to assess ROM due to pain Neuro: Alert and orientedx3, speech normal   Labs and Imaging: CBC BMET  Recent Labs  Lab 06/06/18 2059  WBC 12.4*  HGB 11.9*  HCT 38.4  PLT 143*   Recent Labs  Lab 06/06/18 2059  NA 141  K 4.3  CL 96*  CO2 32  BUN 24*  CREATININE 4.84*  GLUCOSE 93  CALCIUM 9.0     Dg Chest 1 View  Result Date: 06/06/2018 CLINICAL DATA:  Fall EXAM: CHEST  1 VIEW COMPARISON:  01/10/2018 FINDINGS: Moderate cardiomegaly. Remote median sternotomy and CABG. No focal airspace consolidation or pulmonary edema. No pleural effusion or pneumothorax. IMPRESSION: Cardiomegaly without focal airspace disease. Electronically Signed   By: Ulyses Jarred M.D.   On: 06/06/2018 22:25   Dg Hip Unilat With Pelvis 2-3 Views Right  Result Date: 06/06/2018 CLINICAL DATA:  Fall EXAM: DG HIP (WITH OR WITHOUT PELVIS) 2-3V RIGHT COMPARISON:  None. FINDINGS: Medially angulated fracture at the base of the right femoral neck femoral head remains positioned in the acetabulum. No pelvic fracture. IMPRESSION: Medially angulated fracture of the base of the right femoral neck. Electronically Signed   By: Ulyses Jarred M.D.   On: 06/06/2018 22:21  Mina Marble [place], DO 06/06/2018, 10:51 PM PGY-1, [place] [facility] pager: [phone], text pages welcome  Resident Attestation   I saw and evaluated the patient, performing the key elements of the service.I  personally performed or re-performed the history, physical exam, and medical decision making activities of this service and have verified that the service and findings are accurately documented in the resident's note. I developed the management plan that is described in the resident's note, and I agree with the content, with my edits above in red.   Harolyn Rutherford, DO [facility], PGY-2

## 2018-06-06 NOTE — ED Provider Notes (Signed)
[facility] EMERGENCY DEPARTMENT Provider Note   CSN: [id number] Arrival date & time: 06/06/18  2039    History   Chief Complaint No chief complaint on file.   HPI Christine Santos is a 73 y.o. female.     HPI  72 year old female presents with a fall and right hip pain.  Occurred just prior to arrival.  She could not get up due to the pain in her hip.  No numbness or weakness.  Did not hit her head or lose consciousness.  No chest pain, shortness of breath.  Pain is severe though somewhat improved by EMS IV medications.  Currently about a 7 out of 10.  Past Medical History:  Diagnosis Date  . Anemia 08/29/2011  . Anxiety   . Arthritis    "legs" (01/22/2013)  . CAD (coronary artery disease)    s/p CABG in 1999  . Cataract   . Chronic kidney disease    Sees Dr [name]  . 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 Diastolic heart failure ([place])   . Dysrhythmia    PAF  . GERD (gastroesophageal reflux disease)   . Gout   . H/O hiatal hernia   . Headache    "years ago migraines, none in along time."  . Heart murmur   . Hyperlipidemia   . Hypertension   . IDDM (insulin dependent diabetes mellitus) ([place])    Type II  . Myocardial infarction ([place])    before 1995  . PAD (peripheral artery disease) ([place])   . Paroxysmal atrial fibrillation (HCC)    a. identified on ILR as part of StrokeAF study  . Peripheral vascular disease ([place])   . Pneumonia 11/2016  . 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 Still has balance issues 03/05/2018- vision improved, balance improved , back on walker since hospitaization,     Patient Active Problem List   Diagnosis Date Noted  . High risk social situation 02/05/2018  . Enteritis due to Clostridium difficile 07/02/2017  . History of stroke 01/20/2017  . Physical deconditioning 12/30/2016  . Transaminitis 12/23/2016  . Paroxysmal atrial fibrillation ([place])  10/22/2015  . Laryngopharyngeal reflux (LPR) 09/14/2015  . 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 Diabetic nephropathy ([place]) 05/23/2013  . Diabetic neuropathy ([place]) 11/30/2012  . Macular degeneration 07/04/2012  . Diabetic retinopathy ([place]) 03/20/2012  . Osteopenia 09/22/2011  . Anemia in chronic kidney disease 08/29/2011  . Gout 04/13/2011  . Stress incontinence, female 11/11/2010  . Anxiety 08/18/2010  . Chronic systolic heart failure ([place]) 02/18/2009  . SINUS BRADYCARDIA 10/29/2008  . Peripheral vascular disease ([place]) 06/18/2008  . ESRD (end stage renal disease) on dialysis ([place]) 07/13/2007  . Chronic lymphocytic leukemia ([place]) 10/13/2006  . 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 HYPERCHOLESTEROLEMIA 03/23/2006  . Essential hypertension 03/23/2006  . Coronary atherosclerosis 03/23/2006  . REFLUX ESOPHAGITIS 03/23/2006  . IRRITABLE BOWEL SYNDROME 03/23/2006  . CYSTOCELE/RECTOCELE/PROLAPSE,UNSPEC. 03/23/2006  . Osteoarthritis, multiple sites 03/23/2006    Past Surgical History:  Procedure Laterality Date  . A/V FISTULAGRAM Left 12/25/2017   Procedure: A/V FISTULAGRAM;  Surgeon: Waynetta Sandy, MD;  Location: [facility];  Service: Cardiovascular;  Laterality: Left;  . A/V SHUNT INTERVENTION Left 12/25/2017   Procedure: A/V SHUNT INTERVENTION;  Surgeon: Waynetta Sandy, MD;  Location: [place] [facility];  Service: Cardiovascular;  Laterality: Left;  .  ANGIOPLASTY / STENTING FEMORAL Right 09/2010   SFA/notes 11/25/2010 (01/22/2013)  . ANGIOPLASTY / STENTING FEMORAL Left 11/2010   SFA/notes 11/25/2010 (01/22/2013)  . ANGIOPLASTY / STENTING ILIAC     [name] 11/25/2010 (01/22/2013)  . AV FISTULA PLACEMENT Left 03/07/2018   Procedure: CONVERSION TO  ARTERIOVENOUS Artegraft GRAFT ARM;  Surgeon: Waynetta Sandy, MD;  Location: [facility];  Service: Vascular;  Laterality: Left;  .  BASCILIC VEIN TRANSPOSITION Left 08/23/2017   Procedure: FIRST STAGE BASILIC VEIN TRANSPOSITION LEFT UPPER EXTREMITY;  Surgeon: [name], MD;  Location: [facility];  Service: Vascular;  Laterality: Left;  . BASCILIC VEIN TRANSPOSITION Left 10/23/2017   Procedure: SECOND STAGE BASILIC VEIN TRANSPOSITION LEFT ARM;  Surgeon: [name], MD;  Location: [facility];  Service: Vascular;  Laterality: Left;  . CATARACT EXTRACTION W/ INTRAOCULAR LENS  IMPLANT, BILATERAL Bilateral ?2011  . CHOLECYSTECTOMY  1993  . CORONARY ARTERY BYPASS GRAFT  10/1994   "CABG X3"  . EP IMPLANTABLE DEVICE N/A 09/11/2015   Procedure: Loop Recorder Insertion;  Surgeon: Thompson Grayer, MD;  Location: [facility];  Service: Cardiovascular;  Laterality: N/A;  . HEEL SPUR EXCISION Bilateral 1970's  . IR FLUORO GUIDE CV LINE RIGHT  11/01/2017  . IR FLUORO GUIDE CV LINE RIGHT  11/05/2017  . IR US GUIDE VASC ACCESS RIGHT  11/01/2017  . IR US GUIDE VASC ACCESS RIGHT  11/05/2017  . LOWER EXTREMITY ANGIOGRAM N/A 01/22/2013   Procedure: LOWER EXTREMITY ANGIOGRAM;  Surgeon: [name], MD;  Location: [facility] CATH LAB;  Service: Cardiovascular;  Laterality: N/A;  . SHOULDER OPEN ROTATOR CUFF REPAIR Bilateral 1990's   "2 times on 1 side; once on the other"  . TRANSLUMINAL ATHERECTOMY FEMORAL ARTERY Right 01/22/2013   & balloon  . TUBAL LIGATION  1980's  . VIDEO ASSISTED THORACOSCOPY (VATS)/EMPYEMA Left 11/29/2016   Procedure: VIDEO ASSISTED THORACOSCOPY (VATS)/EMPYEMA;  Surgeon: Prescott Gum, Collier Salina, MD;  Location: [facility];  Service: Thoracic;  Laterality: Left;  VIDEO ASSISTED THORACOSCOPY (VATS)/EMPYEMA     OB History   No obstetric history on file.      Home Medications    Prior to Admission medications   Medication Sig Start Date End Date Taking? Authorizing Provider  ACCU-CHEK AVIVA PLUS test strip TEST THREE TIMES A DAY 04/26/17   [name], MD  acetaminophen (TYLENOL) 500 MG tablet Take 1,000 mg by mouth  every 6 (six) hours as needed for moderate pain or headache.    [provider]  allopurinol (ZYLOPRIM) 100 MG tablet Take 0.5 tablets (50 mg total) by mouth daily. 11/06/17   Anderson, Chelsey L, DO  apixaban (ELIQUIS) 5 MG TABS tablet Take 1 tablet (5 mg total) by mouth 2 (two) times daily. 05/29/18   [name], MD  Ascorbic Acid (VITAMIN C) 1000 MG tablet Take 1,000 mg by mouth daily.    [provider]  benzonatate (TESSALON) 100 MG capsule Take 1 capsule (100 mg total) by mouth 2 (two) times daily as needed for cough. 01/10/18   [name], MD  carboxymethylcellulose (REFRESH PLUS) 0.5 % SOLN Place 1 drop into both eyes 3 (three) times daily.     [provider]  enoxaparin (LOVENOX) 60 MG/0.6ML injection Inject 0.6 mLs (60 mg total) into the skin every 12 (twelve) hours. Patient taking differently: Inject 60 mg into the skin daily.  03/02/18   [name], MD  ferric citrate (AURYXIA) 1 GM 210 MG(Fe) tablet  Take 420 mg by mouth 3 (three) times daily with meals.    [provider]  ferrous sulfate 325 (65 FE) MG tablet Take 1 tablet (325 mg total) by mouth daily with breakfast. 05/23/13   [name], MD  GNP GARLIC EXTRACT PO Take 1 tablet by mouth daily.     [provider]  HUMALOG KWIKPEN 100 UNIT/ML KwikPen INJECT 5 UNITS UNDER THE SKIN THREE TIMES DAILY BEFORE MEALS 03/07/18   [name], MD  hydrALAZINE (APRESOLINE) 50 MG tablet TAKE 1 TABLET BY MOUTH THREE TIMES DAILY 10/18/17   [name], MD  Insulin Glargine (LANTUS SOLOSTAR) 100 UNIT/ML Solostar Pen Inject 5-12 Units into the skin daily. 03/07/18   Rhyne, Hulen Shouts, PA-C  Lanthanum Carbonate (FOSRENOL PO) Take by mouth 3 (three) times daily with meals.    [provider]  Melatonin 10 MG CAPS Take 10 mg by mouth at bedtime.     [provider]  metoprolol succinate (TOPROL-XL) 25 MG 24 hr tablet Take 1 tablet (25 mg total) by  mouth daily. 02/05/18   [name], MD  Multiple Vitamins-Minerals (EYE VITAMINS PO) Take 1 capsule by mouth 2 (two) times daily. Taking one daily from her opthamologist (Dr. Herbert Deaner)     [provider]  Multiple Vitamins-Minerals (MULTIVITAMINS THER. W/MINERALS) TABS Take 1 tablet by mouth daily.      [provider]  Omega-3 Fatty Acids (FISH OIL) 1000 MG CAPS Take 1,000 mg by mouth daily.    [provider]  ondansetron (ZOFRAN) 4 MG tablet Take 1 tablet (4 mg total) by mouth every 8 (eight) hours as needed for nausea or vomiting. 10/11/17   Carlyle Basques, MD  pantoprazole (PROTONIX) 40 MG tablet TAKE 1 TABLET BY MOUTH DAILY Patient taking differently: Take 40 mg by mouth at bedtime.  05/29/17   [name], MD  PARoxetine (PAXIL) 40 MG tablet Take 1 tablet (40 mg total) by mouth every morning. 10/18/17   [name], MD  rosuvastatin (CRESTOR) 20 MG tablet TAKE 1 TABLET(20 MG) BY MOUTH DAILY 05/31/18   [name], MD  torsemide (DEMADEX) 100 MG tablet Take 1 tablet (100 mg total) by mouth daily. 12/28/17   [name], MD  traMADol (ULTRAM) 50 MG tablet Take 1 tablet (50 mg total) by mouth every 6 (six) hours as needed (for pain.). 12/29/17   [name], MD    Family History Family History  Problem Relation Age of Onset  . Heart disease Mother   . Heart attack Mother   . Hypertension Mother   . Kidney disease Mother   . Pneumonia Father   . Heart attack Father   . Hyperlipidemia Father   . Diabetes Sister   . Breast cancer Sister   . Arthritis Brother   . Diabetes Brother   . Heart attack Sister   . Obesity Sister   . Hypertension Sister   . Heart attack Sister   . Cancer Sister        breast  . Hyperlipidemia Sister   . Hypertension Sister   . Hyperlipidemia Sister   . Breast cancer Maternal Grandmother     Social History Social History   Tobacco Use  . Smoking status: Never Smoker  . Smokeless tobacco: Never  Used  Substance Use Topics  . Alcohol use: No  . Drug use: No     Allergies   Cefepime; Amoxicillin; Codeine phosphate; Penicillins; and  Propoxyphene   Review of Systems Review of Systems  Constitutional: Negative for fever.  Respiratory: Negative for shortness of breath.   Cardiovascular: Negative for chest pain.  Gastrointestinal: Negative for abdominal pain and vomiting.  Musculoskeletal: Positive for arthralgias.  Neurological: Negative for weakness and numbness.  All other systems reviewed and are negative.    Physical Exam Updated Vital Signs BP (!) 116/58 (BP Location: Right Arm)   Pulse (!) 105   Temp 98.3 F (36.8 C) (Oral)   Resp 16   Ht 5\' 5"  (1.651 m)   Wt 71.7 kg   SpO2 98%   BMI 26.29 kg/m   Physical Exam Vitals signs and nursing note reviewed.  Constitutional:      General: She is not in acute distress.    Appearance: She is well-developed. She is not ill-appearing.  HENT:     Head: Normocephalic and atraumatic.     Right Ear: External ear normal.     Left Ear: External ear normal.     Nose: Nose normal.  Eyes:     General:        Right eye: No discharge.        Left eye: No discharge.  Cardiovascular:     Rate and Rhythm: Tachycardia present. Rhythm irregular.     Heart sounds: Normal heart sounds.  Pulmonary:     Effort: Pulmonary effort is normal.     Breath sounds: Normal breath sounds.  Abdominal:     General: There is no distension.     Palpations: Abdomen is soft.     Tenderness: There is no abdominal tenderness.  Musculoskeletal:     Right hip: She exhibits decreased range of motion and tenderness.     Right knee: No tenderness found.     Right upper leg: She exhibits no tenderness.     Comments: Normal strength/sensation in R foot  Skin:    General: Skin is warm and dry.  Neurological:     Mental Status: She is alert.  Psychiatric:        Mood and Affect: Mood is not anxious.      ED Treatments / Results  Labs (all  labs ordered are listed, but only abnormal results are displayed) Labs Reviewed  BASIC METABOLIC PANEL - Abnormal; Notable for the following components:      Result Value   Chloride 96 (*)    BUN 24 (*)    Creatinine, Ser 4.84 (*)    GFR calc non Af Amer 8 (*)    GFR calc Af Amer 10 (*)    All other components within normal limits  CBC WITH DIFFERENTIAL/PLATELET - Abnormal; Notable for the following components:   WBC 12.4 (*)    Hemoglobin 11.9 (*)    RDW 19.9 (*)    Platelets 143 (*)    Neutro Abs 8.4 (*)    Monocytes Absolute 1.3 (*)    Abs Immature Granulocytes 0.09 (*)    All other components within normal limits  SARS CORONAVIRUS 2 (HOSPITAL ORDER, [place] LAB)  PROTIME-INR  TYPE AND SCREEN    EKG EKG Interpretation  Date/Time:  Wednesday Jun 06 2018 20:46:39 EDT Ventricular Rate:  99 PR Interval:    QRS Duration: 103 QT Interval:  395 QTC Calculation: 507 R Axis:   -9 Text Interpretation:  Atrial fibrillation Incomplete RBBB and LAFB Low voltage, precordial leads Abnormal T, consider ischemia, lateral leads Prolonged QT interval Confirmed by Sherwood Gambler (  [id number]) on 06/06/2018 8:49:37 PM   Radiology Dg Chest 1 View  Result Date: 06/06/2018 CLINICAL DATA:  Fall EXAM: CHEST  1 VIEW COMPARISON:  01/10/2018 FINDINGS: Moderate cardiomegaly. Remote median sternotomy and CABG. No focal airspace consolidation or pulmonary edema. No pleural effusion or pneumothorax. IMPRESSION: Cardiomegaly without focal airspace disease. Electronically Signed   By: Ulyses Jarred M.D.   On: 06/06/2018 22:25   Dg Hip Unilat With Pelvis 2-3 Views Right  Result Date: 06/06/2018 CLINICAL DATA:  Fall EXAM: DG HIP (WITH OR WITHOUT PELVIS) 2-3V RIGHT COMPARISON:  None. FINDINGS: Medially angulated fracture at the base of the right femoral neck femoral head remains positioned in the acetabulum. No pelvic fracture. IMPRESSION: Medially angulated fracture of the base of the  right femoral neck. Electronically Signed   By: Ulyses Jarred M.D.   On: 06/06/2018 22:21    Procedures Procedures (including critical care time)  Medications Ordered in ED Medications  fentaNYL (SUBLIMAZE) injection 50 mcg (50 mcg Intravenous Given 06/06/18 2135)     Initial Impression / Assessment and Plan / ED Course  I have reviewed the triage vital signs and the nursing notes.  Pertinent labs & imaging results that were available during my care of the patient were reviewed by me and considered in my medical decision making (see chart for details).        X-ray confirms right hip fracture.  Pain is better with IV fentanyl.  Family practice consulted for admission.  She last took her Eliquis this morning.  Discussed with orthopedics, Dr. Erlinda Hong, who advises patient will need to have her surgery delayed due to this until 5/15.  No head injury.  Final Clinical Impressions(s) / ED Diagnoses   Final diagnoses:  Closed fracture of neck of right femur, initial encounter [facility])    ED Discharge Orders    None       Sherwood Gambler, MD 06/06/18 2255

## 2018-06-07 DIAGNOSIS — M6281 Muscle weakness (generalized): Secondary | ICD-10-CM | POA: Diagnosis not present

## 2018-06-07 DIAGNOSIS — F329 Major depressive disorder, single episode, unspecified: Secondary | ICD-10-CM | POA: Diagnosis present

## 2018-06-07 DIAGNOSIS — I5042 Chronic combined systolic (congestive) and diastolic (congestive) heart failure: Secondary | ICD-10-CM | POA: Diagnosis present

## 2018-06-07 DIAGNOSIS — R41841 Cognitive communication deficit: Secondary | ICD-10-CM | POA: Diagnosis not present

## 2018-06-07 DIAGNOSIS — Z1159 Encounter for screening for other viral diseases: Secondary | ICD-10-CM | POA: Diagnosis not present

## 2018-06-07 DIAGNOSIS — Z8673 Personal history of transient ischemic attack (TIA), and cerebral infarction without residual deficits: Secondary | ICD-10-CM | POA: Diagnosis not present

## 2018-06-07 DIAGNOSIS — N186 End stage renal disease: Secondary | ICD-10-CM | POA: Diagnosis not present

## 2018-06-07 DIAGNOSIS — I132 Hypertensive heart and chronic kidney disease with heart failure and with stage 5 chronic kidney disease, or end stage renal disease: Secondary | ICD-10-CM | POA: Diagnosis not present

## 2018-06-07 DIAGNOSIS — R0989 Other specified symptoms and signs involving the circulatory and respiratory systems: Secondary | ICD-10-CM | POA: Diagnosis not present

## 2018-06-07 DIAGNOSIS — R279 Unspecified lack of coordination: Secondary | ICD-10-CM | POA: Diagnosis not present

## 2018-06-07 DIAGNOSIS — S7291XD Unspecified fracture of right femur, subsequent encounter for closed fracture with routine healing: Secondary | ICD-10-CM | POA: Diagnosis not present

## 2018-06-07 DIAGNOSIS — R1312 Dysphagia, oropharyngeal phase: Secondary | ICD-10-CM | POA: Diagnosis not present

## 2018-06-07 DIAGNOSIS — Z7901 Long term (current) use of anticoagulants: Secondary | ICD-10-CM | POA: Diagnosis not present

## 2018-06-07 DIAGNOSIS — C9110 Chronic lymphocytic leukemia of B-cell type not having achieved remission: Secondary | ICD-10-CM | POA: Diagnosis not present

## 2018-06-07 DIAGNOSIS — R140 Abdominal distension (gaseous): Secondary | ICD-10-CM | POA: Diagnosis not present

## 2018-06-07 DIAGNOSIS — K567 Ileus, unspecified: Secondary | ICD-10-CM | POA: Diagnosis not present

## 2018-06-07 DIAGNOSIS — S72001A Fracture of unspecified part of neck of right femur, initial encounter for closed fracture: Secondary | ICD-10-CM | POA: Diagnosis not present

## 2018-06-07 DIAGNOSIS — I5022 Chronic systolic (congestive) heart failure: Secondary | ICD-10-CM | POA: Diagnosis not present

## 2018-06-07 DIAGNOSIS — E1122 Type 2 diabetes mellitus with diabetic chronic kidney disease: Secondary | ICD-10-CM | POA: Diagnosis not present

## 2018-06-07 DIAGNOSIS — E118 Type 2 diabetes mellitus with unspecified complications: Secondary | ICD-10-CM | POA: Diagnosis not present

## 2018-06-07 DIAGNOSIS — D631 Anemia in chronic kidney disease: Secondary | ICD-10-CM | POA: Diagnosis not present

## 2018-06-07 DIAGNOSIS — E7800 Pure hypercholesterolemia, unspecified: Secondary | ICD-10-CM | POA: Diagnosis present

## 2018-06-07 DIAGNOSIS — I248 Other forms of acute ischemic heart disease: Secondary | ICD-10-CM | POA: Diagnosis present

## 2018-06-07 DIAGNOSIS — E785 Hyperlipidemia, unspecified: Secondary | ICD-10-CM | POA: Diagnosis present

## 2018-06-07 DIAGNOSIS — S72041D Displaced fracture of base of neck of right femur, subsequent encounter for closed fracture with routine healing: Secondary | ICD-10-CM | POA: Diagnosis not present

## 2018-06-07 DIAGNOSIS — M25551 Pain in right hip: Secondary | ICD-10-CM | POA: Diagnosis present

## 2018-06-07 DIAGNOSIS — J9601 Acute respiratory failure with hypoxia: Secondary | ICD-10-CM | POA: Diagnosis not present

## 2018-06-07 DIAGNOSIS — L89153 Pressure ulcer of sacral region, stage 3: Secondary | ICD-10-CM | POA: Diagnosis not present

## 2018-06-07 DIAGNOSIS — R2689 Other abnormalities of gait and mobility: Secondary | ICD-10-CM | POA: Diagnosis not present

## 2018-06-07 DIAGNOSIS — G4700 Insomnia, unspecified: Secondary | ICD-10-CM | POA: Diagnosis present

## 2018-06-07 DIAGNOSIS — Y92019 Unspecified place in single-family (private) house as the place of occurrence of the external cause: Secondary | ICD-10-CM | POA: Diagnosis not present

## 2018-06-07 DIAGNOSIS — Z4789 Encounter for other orthopedic aftercare: Secondary | ICD-10-CM | POA: Diagnosis not present

## 2018-06-07 DIAGNOSIS — Z794 Long term (current) use of insulin: Secondary | ICD-10-CM | POA: Diagnosis not present

## 2018-06-07 DIAGNOSIS — T82898A Other specified complication of vascular prosthetic devices, implants and grafts, initial encounter: Secondary | ICD-10-CM | POA: Diagnosis not present

## 2018-06-07 DIAGNOSIS — Z79899 Other long term (current) drug therapy: Secondary | ICD-10-CM | POA: Diagnosis not present

## 2018-06-07 DIAGNOSIS — I120 Hypertensive chronic kidney disease with stage 5 chronic kidney disease or end stage renal disease: Secondary | ICD-10-CM | POA: Diagnosis not present

## 2018-06-07 DIAGNOSIS — R0602 Shortness of breath: Secondary | ICD-10-CM | POA: Diagnosis not present

## 2018-06-07 DIAGNOSIS — E1151 Type 2 diabetes mellitus with diabetic peripheral angiopathy without gangrene: Secondary | ICD-10-CM | POA: Diagnosis present

## 2018-06-07 DIAGNOSIS — Z992 Dependence on renal dialysis: Secondary | ICD-10-CM | POA: Diagnosis not present

## 2018-06-07 DIAGNOSIS — F419 Anxiety disorder, unspecified: Secondary | ICD-10-CM | POA: Diagnosis present

## 2018-06-07 DIAGNOSIS — S72141A Displaced intertrochanteric fracture of right femur, initial encounter for closed fracture: Secondary | ICD-10-CM | POA: Diagnosis not present

## 2018-06-07 DIAGNOSIS — I10 Essential (primary) hypertension: Secondary | ICD-10-CM | POA: Diagnosis not present

## 2018-06-07 DIAGNOSIS — I482 Chronic atrial fibrillation, unspecified: Secondary | ICD-10-CM | POA: Diagnosis not present

## 2018-06-07 DIAGNOSIS — N185 Chronic kidney disease, stage 5: Secondary | ICD-10-CM | POA: Diagnosis not present

## 2018-06-07 DIAGNOSIS — R5381 Other malaise: Secondary | ICD-10-CM | POA: Diagnosis not present

## 2018-06-07 DIAGNOSIS — Z856 Personal history of leukemia: Secondary | ICD-10-CM | POA: Diagnosis not present

## 2018-06-07 DIAGNOSIS — N2581 Secondary hyperparathyroidism of renal origin: Secondary | ICD-10-CM | POA: Diagnosis not present

## 2018-06-07 DIAGNOSIS — I2510 Atherosclerotic heart disease of native coronary artery without angina pectoris: Secondary | ICD-10-CM | POA: Diagnosis not present

## 2018-06-07 DIAGNOSIS — E11319 Type 2 diabetes mellitus with unspecified diabetic retinopathy without macular edema: Secondary | ICD-10-CM | POA: Diagnosis present

## 2018-06-07 DIAGNOSIS — R1110 Vomiting, unspecified: Secondary | ICD-10-CM | POA: Diagnosis not present

## 2018-06-07 DIAGNOSIS — W010XXA Fall on same level from slipping, tripping and stumbling without subsequent striking against object, initial encounter: Secondary | ICD-10-CM | POA: Diagnosis present

## 2018-06-07 DIAGNOSIS — I480 Paroxysmal atrial fibrillation: Secondary | ICD-10-CM | POA: Diagnosis not present

## 2018-06-07 LAB — COMPREHENSIVE METABOLIC PANEL
ALT: 64 U/L — ABNORMAL HIGH (ref 0–44)
AST: 49 U/L — ABNORMAL HIGH (ref 15–41)
Albumin: 3.1 g/dL — ABNORMAL LOW (ref 3.5–5.0)
Alkaline Phosphatase: 150 U/L — ABNORMAL HIGH (ref 38–126)
Anion gap: 16 — ABNORMAL HIGH (ref 5–15)
BUN: 27 mg/dL — ABNORMAL HIGH (ref 8–23)
CO2: 29 mmol/L (ref 22–32)
Calcium: 9.3 mg/dL (ref 8.9–10.3)
Chloride: 95 mmol/L — ABNORMAL LOW (ref 98–111)
Creatinine, Ser: 5.35 mg/dL — ABNORMAL HIGH (ref 0.44–1.00)
GFR calc Af Amer: 9 mL/min — ABNORMAL LOW (ref >60)
GFR calc non Af Amer: 7 mL/min — ABNORMAL LOW (ref >60)
Glucose, Bld: 137 mg/dL — ABNORMAL HIGH (ref 70–99)
Potassium: 5 mmol/L (ref 3.5–5.1)
Sodium: 140 mmol/L (ref 135–145)
Total Bilirubin: 0.9 mg/dL (ref 0.3–1.2)
Total Protein: 5.4 g/dL — ABNORMAL LOW (ref 6.5–8.1)

## 2018-06-07 LAB — LIPID PANEL
Cholesterol: 137 mg/dL (ref 0–200)
HDL: 58 mg/dL (ref >40)
LDL Cholesterol: 69 mg/dL (ref 0–99)
Total CHOL/HDL Ratio: 2.4 RATIO
Triglycerides: 52 mg/dL (ref <150)
VLDL: 10 mg/dL (ref 0–40)

## 2018-06-07 LAB — CBG MONITORING, ED: Glucose-Capillary: 105 mg/dL — ABNORMAL HIGH (ref 70–99)

## 2018-06-07 LAB — GLUCOSE, CAPILLARY
Glucose-Capillary: 133 mg/dL — ABNORMAL HIGH (ref 70–99)
Glucose-Capillary: 88 mg/dL (ref 70–99)
Glucose-Capillary: 126 mg/dL — ABNORMAL HIGH (ref 70–99)
Glucose-Capillary: 192 mg/dL — ABNORMAL HIGH (ref 70–99)
Glucose-Capillary: 227 mg/dL — ABNORMAL HIGH (ref 70–99)
Glucose-Capillary: 191 mg/dL — ABNORMAL HIGH (ref 70–99)

## 2018-06-07 LAB — PROTIME-INR
INR: 1.1 (ref 0.8–1.2)
Prothrombin Time: 13.7 seconds (ref 11.4–15.2)

## 2018-06-07 LAB — SARS CORONAVIRUS 2 BY RT PCR (HOSPITAL ORDER, PERFORMED IN ~~LOC~~ HOSPITAL LAB): SARS Coronavirus 2: NEGATIVE

## 2018-06-07 MED ORDER — SODIUM CHLORIDE 0.9% FLUSH
3.0000 mL | Freq: Two times a day (BID) | INTRAVENOUS | Status: DC
  Administered 2018-06-07 (×2): 3 mL via INTRAVENOUS

## 2018-06-07 MED ORDER — TRANEXAMIC ACID-NACL 1000-0.7 MG/100ML-% IV SOLN
1000.0000 mg | INTRAVENOUS | Status: AC
  Administered 2018-06-08: 1000 mg via INTRAVENOUS
  Filled 2018-06-07 (×2): qty 100

## 2018-06-07 MED ORDER — PANTOPRAZOLE SODIUM 40 MG IV SOLR: 40.0000 mg | INTRAVENOUS | Status: DC

## 2018-06-07 MED ORDER — ROSUVASTATIN CALCIUM 20 MG PO TABS
20.0000 mg | ORAL_TABLET | Freq: Every day | ORAL | Status: DC
  Administered 2018-06-07 – 2018-06-14 (×6): 20 mg via ORAL
  Filled 2018-06-07 (×6): qty 1

## 2018-06-07 MED ORDER — RIFAXIMIN 550 MG PO TABS: 550.0000 mg | ORAL_TABLET | Freq: Two times a day (BID) | ORAL | Status: DC

## 2018-06-07 MED ORDER — POVIDONE-IODINE 10 % EX SWAB: 2.0000 "application " | Freq: Once | CUTANEOUS | Status: DC

## 2018-06-07 MED ORDER — TRANEXAMIC ACID 1000 MG/10ML IV SOLN
2000.0000 mg | INTRAVENOUS | Status: DC
  Filled 2018-06-07: qty 20

## 2018-06-07 MED ORDER — INSULIN ASPART 100 UNIT/ML ~~LOC~~ SOLN
0.0000 [IU] | SUBCUTANEOUS | Status: DC
  Administered 2018-06-07: 1 [IU] via SUBCUTANEOUS
  Administered 2018-06-07: 2 [IU] via SUBCUTANEOUS
  Administered 2018-06-07: 1 [IU] via SUBCUTANEOUS
  Administered 2018-06-07: 3 [IU] via SUBCUTANEOUS
  Administered 2018-06-08 (×2): 2 [IU] via SUBCUTANEOUS
  Administered 2018-06-08: 1 [IU] via SUBCUTANEOUS
  Administered 2018-06-09 (×6): 2 [IU] via SUBCUTANEOUS
  Administered 2018-06-10: 1 [IU] via SUBCUTANEOUS
  Administered 2018-06-10: 2 [IU] via SUBCUTANEOUS
  Administered 2018-06-10 – 2018-06-12 (×3): 1 [IU] via SUBCUTANEOUS
  Administered 2018-06-12: 3 [IU] via SUBCUTANEOUS
  Administered 2018-06-12 – 2018-06-13 (×2): 2 [IU] via SUBCUTANEOUS
  Administered 2018-06-13: 5 [IU] via SUBCUTANEOUS
  Administered 2018-06-14: 2 [IU] via SUBCUTANEOUS
  Administered 2018-06-14 (×2): 1 [IU] via SUBCUTANEOUS

## 2018-06-07 MED ORDER — METOPROLOL SUCCINATE ER 25 MG PO TB24
25.0000 mg | ORAL_TABLET | Freq: Every day | ORAL | Status: DC
  Administered 2018-06-07 – 2018-06-09 (×3): 25 mg via ORAL
  Filled 2018-06-07 (×3): qty 1

## 2018-06-07 MED ORDER — FERRIC CITRATE 1 GM 210 MG(FE) PO TABS
420.0000 mg | ORAL_TABLET | Freq: Three times a day (TID) | ORAL | Status: DC
  Administered 2018-06-07 – 2018-06-14 (×6): 420 mg via ORAL
  Filled 2018-06-07 (×22): qty 2

## 2018-06-07 MED ORDER — PANTOPRAZOLE SODIUM 40 MG PO TBEC
40.0000 mg | DELAYED_RELEASE_TABLET | Freq: Every day | ORAL | Status: DC
  Administered 2018-06-07 – 2018-06-09 (×3): 40 mg via ORAL
  Filled 2018-06-07 (×3): qty 1

## 2018-06-07 MED ORDER — SODIUM CHLORIDE 0.9% FLUSH: 3.0000 mL | INTRAVENOUS | Status: DC | PRN

## 2018-06-07 MED ORDER — ACETAMINOPHEN 325 MG PO TABS
650.0000 mg | ORAL_TABLET | Freq: Four times a day (QID) | ORAL | Status: DC
  Administered 2018-06-07 – 2018-06-09 (×4): 650 mg via ORAL
  Filled 2018-06-07 (×4): qty 2

## 2018-06-07 MED ORDER — SODIUM CHLORIDE 0.9% FLUSH
3.0000 mL | Freq: Two times a day (BID) | INTRAVENOUS | Status: DC
  Administered 2018-06-07 – 2018-06-09 (×2): 3 mL via INTRAVENOUS

## 2018-06-07 MED ORDER — HYDROMORPHONE HCL 1 MG/ML IJ SOLN
0.5000 mg | INTRAMUSCULAR | Status: DC | PRN
  Administered 2018-06-07 – 2018-06-09 (×6): 0.5 mg via INTRAVENOUS
  Filled 2018-06-07 (×5): qty 1

## 2018-06-07 MED ORDER — CLINDAMYCIN PHOSPHATE 900 MG/50ML IV SOLN
900.0000 mg | INTRAVENOUS | Status: AC
  Administered 2018-06-08: 900 mg via INTRAVENOUS
  Filled 2018-06-07 (×2): qty 50

## 2018-06-07 MED ORDER — CALCITRIOL 0.25 MCG PO CAPS
0.2500 ug | ORAL_CAPSULE | ORAL | Status: DC
  Administered 2018-06-08 – 2018-06-11 (×2): 0.25 ug via ORAL
  Filled 2018-06-07: qty 1

## 2018-06-07 MED ORDER — SODIUM CHLORIDE 0.9 % IV SOLN
250.0000 mL | INTRAVENOUS | Status: DC | PRN
  Administered 2018-06-08: 15:00:00 via INTRAVENOUS

## 2018-06-07 MED ORDER — PAROXETINE HCL 20 MG PO TABS
40.0000 mg | ORAL_TABLET | Freq: Every day | ORAL | Status: DC
  Administered 2018-06-07 – 2018-06-14 (×6): 40 mg via ORAL
  Filled 2018-06-07 (×6): qty 2

## 2018-06-07 MED ORDER — ACETAMINOPHEN 650 MG RE SUPP
650.0000 mg | Freq: Four times a day (QID) | RECTAL | Status: DC
  Administered 2018-06-07 (×2): 650 mg via RECTAL
  Filled 2018-06-07 (×2): qty 1

## 2018-06-07 NOTE — Discharge Summary (Signed)
[facility] Discharge Summary  Patient name: Christine Santos Medical record number: [id number] Date of birth: November 13, 1945 Age: 73 y.o. Gender: female Date of Admission: 06/06/2018  Date of Discharge: 06/14/2018 Admitting Physician: Christine Resides, MD  Primary Care Provider: [name], MD Consultants: Cardiology  Indication for Hospitalization: Right intertrochanteric femur fracture  Discharge Diagnoses/Problem List:  Principal Problem:   Displaced intertrochanteric fracture of right femur, initial encounter for closed fracture [facility]) Active Problems:   Multiple complications of type II diabetes mellitus ([place])   HYPERCHOLESTEROLEMIA   Essential hypertension   Coronary atherosclerosis   SINUS BRADYCARDIA   Chronic systolic heart failure (HCC)   Peripheral vascular disease ([place])   Reflux esophagitis   Anxiety   Anemia in chronic kidney disease   Diabetic neuropathy ([place])   Diabetic nephropathy ([place])   Paroxysmal atrial fibrillation ([place])   Physical deconditioning   History of stroke   ESRD on dialysis ([place])   High risk social situation   Controlled type 2 diabetes mellitus with complication, with long-term current use of insulin (HCC)   Closed fracture of neck of right femur (HCC)   Ileus (HCC)  Disposition: SNF  Discharge Condition: stable, improved  Discharge Exam:   Physical Exam:  General: 73 y.o. female in NAD, lying in bed Cardio: Irregular Lungs: CTAB, no wheezing, no rhonchi, no crackles, no IWOB on 2L Abdomen: Soft, non-tender to palpation, non-distended, positive bowel sounds Skin: warm and dry Extremities: No edema, bandages on right lower extremity  Brief Hospital Course:  Christine Santos is a 73 year old female with past medical history significant for ESRD on HD MWF, heart failure, PVD, who presented with displaced intra-trochanteric fracture of right femur.  Orthopedic surgery was consulted and recommended surgery.  Patient's pain was well controlled with scheduled tylenol and prn oxycodone IR 2.5mg .  Patient worked with PT and OT while in the hospital who recommended SNF. She was discharged to SNF and follow-up with orthopedic surgery  During patient's admission, patient was found to have cold left upper extremity which was further evaluated with Doppler ultrasound and found to have decreased flow to AV graft.  Vascular surgery was consulted and recommended outpatient follow up.  Patient also developed post-op ileus and A Fib with RVR (hx known A fib).  This improved after being made NPO and being placed on dilt drip.  Cardiology assisted in the management of her tachycardia and discharged home on Metoprolol 25mg  QD, as cause of RVR was likely pain 2/2 surgery and post-op ileus.  Post-op ileus improved and at discharge patient was tolerating a PO diet with stable vitals.  She also developed Acute Hypoxic Respiratory Failure following surgery and was on 2L per [place].  She was able to wean through her hospitalization and was on RA at the time of discharge.  She was able to ambulate with pulse ox on room air without desaturation and did not require DME oxygen at the time of discharge.  Issues for Follow Up:  1. Consider switching from paroxetine as it is on [place]'s list and can cause increase in falls and fractures.   2. Vascular surgery recommends outpatient follow up. 3. Patient should follow up with orthopedic surgery in 2 weeks for suture removal. 4. Patient referred to endocrinology on discharge given intertrochanteric fracture.  Patient should have DEXA scan as outpatient and consideration of starting calcium therapy, although patient is ESRD, therefore would need to continue discussion with nephrology. 5. Patient should have outpatient stress  test after recovery from hip fracture.  Significant Procedures:  Intramedullary intertrochanteric nail  Significant Labs and Imaging:  Recent Labs  Lab 06/12/18 0331  06/13/18 0412 06/14/18 0257  WBC 11.0* 11.6* 13.2*  HGB 10.9* 10.5* 10.9*  HCT 33.9* 33.0* 33.8*  PLT 147* 150 149*   Recent Labs  Lab 06/10/18 0350 06/11/18 0227 06/12/18 0331 06/13/18 0412 06/14/18 0257  NA 136 133* 137 135 139  K 5.1 5.4* 4.1 4.0 4.2  CL 94* 94* 94* 95* 97*  CO2 22 21* 26 27 32  GLUCOSE 134* 116* 127* 153* 132*  BUN 54* 70* 25* 43* 20  CREATININE 6.66* 7.62* 4.19* 5.90* 3.78*  CALCIUM 8.9 8.4* 8.5* 8.3* 8.4*  PHOS 8.5* 8.3* 5.2* 5.0* 3.0  ALBUMIN 2.9* 2.4* 2.5* 2.3* 2.5*    COVID negative  Dg Chest 1 View  Result Date: 06/06/2018 CLINICAL DATA:  Fall EXAM: CHEST  1 VIEW COMPARISON:  01/10/2018 FINDINGS: Moderate cardiomegaly. Remote median sternotomy and CABG. No focal airspace consolidation or pulmonary edema. No pleural effusion or pneumothorax. IMPRESSION: Cardiomegaly without focal airspace disease. Electronically Signed   By: Christine Santos M.D.   On: 06/06/2018 22:25   Dg Hip Unilat With Pelvis 2-3 Views Right  Result Date: 06/06/2018 CLINICAL DATA:  Fall EXAM: DG HIP (WITH OR WITHOUT PELVIS) 2-3V RIGHT COMPARISON:  None. FINDINGS: Medially angulated fracture at the base of the right femoral neck femoral head remains positioned in the acetabulum. No pelvic fracture. IMPRESSION: Medially angulated fracture of the base of the right femoral neck. Electronically Signed   By: Christine Santos M.D.   On: 06/06/2018 22:21   Results/Tests Pending at Time of Discharge: None  Discharge Medications:  Allergies as of 06/14/2018      Reactions   Cefepime Anaphylaxis   Throat swelling   Amoxicillin Rash   Did it involve swelling of the face/tongue/throat, SOB, or low BP? No Did it involve sudden or severe rash/hives, skin peeling, or any reaction on the inside of your mouth or nose? No Did you need to seek medical attention at a hospital or doctor's office? No When did it last happen?10+ years If all above answers are "NO", may proceed with cephalosporin  use.   Codeine Phosphate Rash   Penicillins Rash   Did it involve swelling of the face/tongue/throat, SOB, or low BP? No Did it involve sudden or severe rash/hives, skin peeling, or any reaction on the inside of your mouth or nose? No Did you need to seek medical attention at a hospital or doctor's office? No When did it last happen?10+ years If all above answers are "NO", may proceed with cephalosporin use.   Propoxyphene Rash      Medication List    STOP taking these medications   ondansetron 4 MG tablet Commonly known as:  Zofran   traMADol 50 MG tablet Commonly known as:  ULTRAM     TAKE these medications   Accu-Chek Aviva Plus test strip Generic drug:  glucose blood TEST THREE TIMES A DAY   acetaminophen 500 MG tablet Commonly known as:  TYLENOL Take 1,000 mg by mouth every 6 (six) hours as needed for moderate pain or headache.   allopurinol 100 MG tablet Commonly known as:  ZYLOPRIM Take 0.5 tablets (50 mg total) by mouth daily.   apixaban 5 MG Tabs tablet Commonly known as:  Eliquis Take 1 tablet (5 mg total) by mouth 2 (two) times daily.   calcitRIOL 0.25 MCG capsule Commonly known  as:  ROCALTROL Take 1 capsule (0.25 mcg total) by mouth every Monday, Wednesday, and Friday with hemodialysis. Start taking on:  Jun 15, 2018   carboxymethylcellulose 0.5 % Soln Commonly known as:  REFRESH PLUS Place 1 drop into both eyes 3 (three) times daily.   ferric citrate 1 GM 210 MG(Fe) tablet Commonly known as:  AURYXIA Take 2 tablets (420 mg total) by mouth 3 (three) times daily with meals.   Fish Oil 1000 MG Caps Take 1,000 mg by mouth daily.   GNP GARLIC EXTRACT PO Take 1 tablet by mouth daily.   HumaLOG KwikPen 100 UNIT/ML KwikPen Generic drug:  insulin lispro INJECT 5 UNITS UNDER THE SKIN THREE TIMES DAILY BEFORE MEALS What changed:  See the new instructions.   Insulin Glargine 100 UNIT/ML Solostar Pen Commonly known as:  Lantus SoloStar Inject 5-12  Units into the skin daily. What changed:  how much to take   Melatonin 10 MG Caps Take 10 mg by mouth at bedtime.   metoprolol succinate 25 MG 24 hr tablet Commonly known as:  TOPROL-XL Take 1 tablet (25 mg total) by mouth daily.   EYE VITAMINS PO Take 1 capsule by mouth 2 (two) times daily. Taking one daily from her opthamologist (Dr. Herbert Deaner)   multivitamins ther. w/minerals Tabs tablet Take 1 tablet by mouth daily.   nitroGLYCERIN 0.4 MG SL tablet Commonly known as:  NITROSTAT Place 1 tablet (0.4 mg total) under the tongue every 5 (five) minutes as needed for chest pain.   oxyCODONE 5 MG immediate release tablet Commonly known as:  Oxy IR/ROXICODONE Take 0.5 tablets (2.5 mg total) by mouth every 6 (six) hours as needed for up to 5 days for severe pain or breakthrough pain.   pantoprazole 40 MG tablet Commonly known as:  PROTONIX TAKE 1 TABLET BY MOUTH DAILY What changed:  when to take this   PARoxetine 40 MG tablet Commonly known as:  PAXIL Take 1 tablet (40 mg total) by mouth every morning. What changed:  when to take this   rosuvastatin 20 MG tablet Commonly known as:  CRESTOR TAKE 1 TABLET(20 MG) BY MOUTH DAILY What changed:  See the new instructions.   vitamin C 1000 MG tablet Take 1,000 mg by mouth daily.       Discharge Instructions: Please refer to Patient Instructions section of EMR for full details.  Patient was counseled important signs and symptoms that should prompt return to medical care, changes in medications, dietary instructions, activity restrictions, and follow up appointments.   Follow-Up Appointments: Follow-up Information    Leandrew Koyanagi, MD In 2 weeks.   Specialty:  Orthopedic Surgery Why:  For suture removal, For wound re-check Contact information: [place] [address] [phone]        [name], MD Follow up.   Specialties:  Cardiology, Radiology, Interventional Cardiology Why:  our  office will contact you to set up a telehealth visit (either video chat of telephone call) with Dr. Irish Lack or with his PA/NP in 3-4 weeks for hospital follow-up, after your stress test.  Contact information: [address] [place] [address] [phone]        Christine Resides, MD. Schedule an appointment as soon as possible for a visit.   Specialty:  Family Medicine Why:  after SNF discharge Contact information: [place] [address] [phone]        [facility] Follow  up.   Specialty:  Cardiology Why:  You will be called by our office to scheule a stress test for 3-4 weeks, after you are out of rehab.  Contact information: [address] [place]          Cleophas Dunker, DO 06/14/2018, 2:04 PM PGY-1, [place]

## 2018-06-07 NOTE — Progress Notes (Signed)
OT Cancellation Note  Patient Details Name: Christine Santos MRN: [id number] DOB: 07-15-1945   Cancelled Treatment:    Reason Eval/Treat Not Completed: Active bedrest order. Plans for surgery on 5/15 in the PM per ortho.  OT will continue to f/u and will evaluate pending updated activity orders and medical readiness.   Darrol Jump OTR/L 06/07/2018, 10:11 AM

## 2018-06-07 NOTE — Consult Note (Addendum)
[facility] Renal Consultation Note  Indication for Consultation:  Management of ESRD/hemodialysis; anemia, hypertension/volume and secondary hyperparathyroidism  HPI: Christine Santos is a 73 y.o. 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 Ho C diff., Anxiety, Depression (on paxil),Gout,CVA 2017 ,VATS (emyema ) Left 11/29/16 Dr Darcey Nora and Afib on Coumadin ,CAD sp CABG.   Now admitted after fall at home yesterday.In ER  Noted to have Right Femoral Fracture: acute, stable  /hgb  11.9,  PT/INR 14.1/1.1. K  4.3 CXR  She reports uneventful HD in am (drives herself,independent @hone ),no dizziness, fevers, chills ,sob, cp, Cough, n/v/d , or abd pain . She denies any issues with HD- gets along well, some bruising at AVF site      Past Medical History:  Diagnosis Date  . Anemia 08/29/2011  . Anxiety   . Arthritis    "legs" (01/22/2013)  . CAD (coronary artery disease)    s/p CABG in 1999  . Cataract   . Chronic kidney disease    Sees Dr [name]  . 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 Diastolic heart failure ([place])   . Dysrhythmia    PAF  . GERD (gastroesophageal reflux disease)   . Gout   . H/O hiatal hernia   . Headache    "years ago migraines, none in along time."  . Heart murmur   . Hyperlipidemia   . Hypertension   . IDDM (insulin dependent diabetes mellitus) ([place])    Type II  . Myocardial infarction ([place])    before 1995  . PAD (peripheral artery disease) ([place])   . Paroxysmal atrial fibrillation (HCC)    a. identified on ILR as part of StrokeAF study  . Peripheral vascular disease ([place])   . Pneumonia 11/2016  . 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 Still has balance issues 03/05/2018- vision improved, balance improved , back on walker since hospitaization,     Past Surgical  History:  Procedure Laterality Date  . A/V FISTULAGRAM Left 12/25/2017   Procedure: A/V FISTULAGRAM;  Surgeon: Waynetta Sandy, MD;  Location: [facility];  Service: Cardiovascular;  Laterality: Left;  . A/V SHUNT INTERVENTION Left 12/25/2017   Procedure: A/V SHUNT INTERVENTION;  Surgeon: Waynetta Sandy, MD;  Location: [facility];  Service: Cardiovascular;  Laterality: Left;  . ANGIOPLASTY / STENTING FEMORAL Right 09/2010   SFA/notes 11/25/2010 (01/22/2013)  . ANGIOPLASTY / STENTING FEMORAL Left 11/2010   SFA/notes 11/25/2010 (01/22/2013)  . ANGIOPLASTY / STENTING ILIAC     [name] 11/25/2010 (01/22/2013)  . AV FISTULA PLACEMENT Left 03/07/2018   Procedure: CONVERSION TO  ARTERIOVENOUS Artegraft GRAFT ARM;  Surgeon: Waynetta Sandy, MD;  Location: [facility];  Service: Vascular;  Laterality: Left;  . BASCILIC VEIN TRANSPOSITION Left 08/23/2017   Procedure: FIRST STAGE BASILIC VEIN TRANSPOSITION LEFT UPPER EXTREMITY;  Surgeon: [name], MD;  Location: [facility];  Service: Vascular;  Laterality: Left;  . BASCILIC VEIN TRANSPOSITION Left 10/23/2017   Procedure: SECOND STAGE BASILIC VEIN TRANSPOSITION LEFT ARM;  Surgeon: [name], MD;  Location: [facility];  Service: Vascular;  Laterality: Left;  . CATARACT EXTRACTION W/ INTRAOCULAR LENS  IMPLANT, BILATERAL Bilateral ?2011  . CHOLECYSTECTOMY  1993  . CORONARY ARTERY BYPASS GRAFT  10/1994   "CABG X3"  . EP IMPLANTABLE DEVICE N/A  09/11/2015   Procedure: Loop Recorder Insertion;  Surgeon: Thompson Grayer, MD;  Location: [facility];  Service: Cardiovascular;  Laterality: N/A;  . HEEL SPUR EXCISION Bilateral 1970's  . IR FLUORO GUIDE CV LINE RIGHT  11/01/2017  . IR FLUORO GUIDE CV LINE RIGHT  11/05/2017  . IR US GUIDE VASC ACCESS RIGHT  11/01/2017  . IR US GUIDE VASC ACCESS RIGHT  11/05/2017  . LOWER EXTREMITY ANGIOGRAM N/A 01/22/2013   Procedure: LOWER EXTREMITY ANGIOGRAM;  Surgeon: [name],  MD;  [facility] LAB;  Service: Cardiovascular;  Laterality: N/A;  . SHOULDER OPEN ROTATOR CUFF REPAIR Bilateral 1990's   "2 times on 1 side; once on the other"  . TRANSLUMINAL ATHERECTOMY FEMORAL ARTERY Right 01/22/2013   & balloon  . TUBAL LIGATION  1980's  . VIDEO ASSISTED THORACOSCOPY (VATS)/EMPYEMA Left 11/29/2016   Procedure: VIDEO ASSISTED THORACOSCOPY (VATS)/EMPYEMA;  Surgeon: [name], MD;  Location[facility];  Service: Thoracic;  Laterality: Left;  VIDEO ASSISTED THORACOSCOPY (VATS)/EMPYEMA      Family History  Problem Relation Age of Onset  . Heart disease Mother   . Heart attack Mother   . Hypertension Mother   . Kidney disease Mother   . Pneumonia Father   . Heart attack Father   . Hyperlipidemia Father   . Diabetes Sister   . Breast cancer Sister   . Arthritis Brother   . Diabetes Brother   . Heart attack Sister   . Obesity Sister   . Hypertension Sister   . Heart attack Sister   . Cancer Sister        breast  . Hyperlipidemia Sister   . Hypertension Sister   . Hyperlipidemia Sister   . Breast cancer Maternal Grandmother       reports that she has never smoked. She has never used smokeless tobacco. She reports that she does not drink alcohol or use drugs.   Allergies  Allergen Reactions  . Cefepime Anaphylaxis    Throat swelling  . Amoxicillin Rash    Did it involve swelling of the face/tongue/throat, SOB, or low BP? No Did it involve sudden or severe rash/hives, skin peeling, or any reaction on the inside of your mouth or nose? No Did you need to seek medical attention at a hospital or doctor's office? No When did it last happen?10+ years If all above answers are "NO", may proceed with cephalosporin use.    . Codeine Phosphate Rash  . Penicillins Rash    Did it involve swelling of the face/tongue/throat, SOB, or low BP? No Did it involve sudden or severe rash/hives, skin peeling, or any reaction on the inside of your mouth or nose?  No Did you need to seek medical attention at a hospital or doctor's office? No When did it last happen?10+ years If all above answers are "NO", may proceed with cephalosporin use.     Marland Kitchen Propoxyphene Rash    Prior to Admission medications   Medication Sig Start Date End Date Taking? Authorizing Provider  acetaminophen (TYLENOL) 500 MG tablet Take 1,000 mg by mouth every 6 (six) hours as needed for moderate pain or headache.   Yes [provider]  allopurinol (ZYLOPRIM) 100 MG tablet Take 0.5 tablets (50 mg total) by mouth daily. 11/06/17  Yes Anderson, Chelsey L, DO  apixaban (ELIQUIS) 5 MG TABS tablet Take 1 tablet (5 mg total) by mouth 2 (two) times daily. 05/29/18  Yes [name], MD  Ascorbic Acid (VITAMIN C) 1000 MG tablet Take 1,000 mg by mouth daily.   Yes [provider]  carboxymethylcellulose (REFRESH PLUS) 0.5 % SOLN Place 1 drop into both eyes 3 (three) times daily.    Yes [provider]  ferric citrate (AURYXIA) 1 GM 210 MG(Fe) tablet Take 420 mg by mouth 3 (three) times daily with meals.   Yes [provider]  GNP GARLIC EXTRACT PO Take 1 tablet by mouth daily.    Yes [provider]  HUMALOG KWIKPEN 100 UNIT/ML KwikPen INJECT 5 UNITS UNDER THE SKIN THREE TIMES DAILY BEFORE MEALS Patient taking differently: Inject 5 Units into the skin 3 (three) times daily.  03/07/18  Yes Hensel, Jamal Collin, MD  Insulin Glargine (LANTUS SOLOSTAR) 100 UNIT/ML Solostar Pen Inject 5-12 Units into the skin daily. Patient taking differently: Inject 12 Units into the skin daily.  03/07/18  Yes Rhyne, Hulen Shouts, PA-C  Melatonin 10 MG CAPS Take 10 mg by mouth at bedtime.    Yes [provider]  metoprolol succinate (TOPROL-XL) 25 MG 24 hr tablet Take 1 tablet (25 mg total) by mouth daily. 02/05/18  Yes Hensel, Jamal Collin, MD  Multiple Vitamins-Minerals (EYE VITAMINS PO) Take 1 capsule by mouth 2 (two) times daily. Taking one daily from  her opthamologist (Dr. Herbert Deaner)    Yes [provider]  Multiple Vitamins-Minerals (MULTIVITAMINS THER. W/MINERALS) TABS Take 1 tablet by mouth daily.     Yes [provider]  Omega-3 Fatty Acids (FISH OIL) 1000 MG CAPS Take 1,000 mg by mouth daily.   Yes [provider]  ondansetron (ZOFRAN) 4 MG tablet Take 1 tablet (4 mg total) by mouth every 8 (eight) hours as needed for nausea or vomiting. 10/11/17  Yes Carlyle Basques, MD  pantoprazole (PROTONIX) 40 MG tablet TAKE 1 TABLET BY MOUTH DAILY Patient taking differently: Take 40 mg by mouth at bedtime.  05/29/17  Yes Hensel, Jamal Collin, MD  PARoxetine (PAXIL) 40 MG tablet Take 1 tablet (40 mg total) by mouth every morning. Patient taking differently: Take 40 mg by mouth daily.  10/18/17  Yes Hensel, Jamal Collin, MD  rosuvastatin (CRESTOR) 20 MG tablet TAKE 1 TABLET(20 MG) BY MOUTH DAILY Patient taking differently: Take 20 mg by mouth daily.  05/31/18  Yes Hensel, Jamal Collin, MD  traMADol (ULTRAM) 50 MG tablet Take 1 tablet (50 mg total) by mouth every 6 (six) hours as needed (for pain.). 12/29/17  Yes Hensel, Jamal Collin, MD  ACCU-CHEK AVIVA PLUS test strip TEST THREE TIMES A DAY 04/26/17   [name], MD     Anti-infectives (From admission, onward)   Start     Dose/Rate Route Frequency Ordered Stop   06/07/18 1000  rifaximin (XIFAXAN) tablet 550 mg  Status:  Discontinued     550 mg Oral 2 times daily 06/07/18 0423 06/07/18 0423      Results for orders placed or performed during the hospital encounter of 06/06/18 (from the past 48 hour(s))  Basic metabolic panel     Status: Abnormal   Collection Time: 06/06/18  8:59 PM  Result Value Ref Range   Sodium 141 135 - 145 mmol/L   Potassium 4.3 3.5 - 5.1 mmol/L   Chloride 96 (L) 98 - 111 mmol/L   CO2 32 22 - 32 mmol/L   Glucose, Bld 93 70 - 99 mg/dL   BUN 24 (H) 8 - 23 mg/dL   Creatinine, Ser 4.84 (H) 0.44 - 1.00 mg/dL  Calcium 9.0 8.9 - 10.3 mg/dL   GFR calc non Af Amer  8 (L) >60 mL/min   GFR calc Af Amer 10 (L) >60 mL/min   Anion gap 13 5 - 15    Comment: Performed at [facility] [address] [place], [place] [address]  CBC WITH DIFFERENTIAL     Status: Abnormal   Collection Time: 06/06/18  8:59 PM  Result Value Ref Range   WBC 12.4 (H) 4.0 - 10.5 K/uL   RBC 3.87 3.87 - 5.11 MIL/uL   Hemoglobin 11.9 (L) 12.0 - 15.0 g/dL   HCT 38.4 36.0 - 46.0 %   MCV 99.2 80.0 - 100.0 fL   MCH 30.7 26.0 - 34.0 pg   MCHC 31.0 30.0 - 36.0 g/dL   RDW 19.9 (H) 11.5 - 15.5 %   Platelets 143 (L) 150 - 400 K/uL   nRBC 0.0 0.0 - 0.2 %   Neutrophils Relative % 68 %   Neutro Abs 8.4 (H) 1.7 - 7.7 K/uL   Lymphocytes Relative 18 %   Lymphs Abs 2.2 0.7 - 4.0 K/uL   Monocytes Relative 10 %   Monocytes Absolute 1.3 (H) 0.1 - 1.0 K/uL   Eosinophils Relative 3 %   Eosinophils Absolute 0.3 0.0 - 0.5 K/uL   Basophils Relative 0 %   Basophils Absolute 0.0 0.0 - 0.1 K/uL   Immature Granulocytes 1 %   Abs Immature Granulocytes 0.09 (H) 0.00 - 0.07 K/uL    Comment: Performed at [facility], [address], [place], [place] [address]  Protime-INR     Status: None   Collection Time: 06/06/18  8:59 PM  Result Value Ref Range   Prothrombin Time 14.1 11.4 - 15.2 seconds   INR 1.1 0.8 - 1.2    Comment: (NOTE) INR goal varies based on device and disease states. Performed at [facility], [place] [address] [place], [place] [address]   Type and screen [place]     Status: None   Collection Time: 06/06/18  9:12 PM  Result Value Ref Range   ABO/RH(D) O POS    Antibody Screen NEG    Sample Expiration      06/09/2018,2359 Performed at [facility], [place] [address] [place], [place] [address]   SARS Coronavirus 2 (CEPHEID - Performed in [place] hospital lab), Hosp Order     Status: None   Collection Time: 06/06/18 11:03 PM  Result Value Ref Range   SARS Coronavirus 2 NEGATIVE NEGATIVE    Comment: (NOTE) If result is  NEGATIVE SARS-CoV-2 target nucleic acids are NOT DETECTED. The SARS-CoV-2 RNA is generally detectable in upper and lower  respiratory specimens during the acute phase of infection. The lowest  concentration of SARS-CoV-2 viral copies this assay can detect is 250  copies / mL. A negative result does not preclude SARS-CoV-2 infection  and should not be used as the sole basis for treatment or other  patient management decisions.  A negative result may occur with  improper specimen collection / handling, submission of specimen other  than nasopharyngeal swab, presence of viral mutation(s) within the  areas targeted by this assay, and inadequate number of viral copies  (<250 copies / mL). A negative result must be combined with clinical  observations, patient history, and epidemiological information. If result is POSITIVE SARS-CoV-2 target nucleic acids are DETECTED. The SARS-CoV-2 RNA is generally detectable in upper and lower  respiratory  specimens dur ing the acute phase of infection.  Positive  results are indicative of active infection with SARS-CoV-2.  Clinical  correlation with patient history and other diagnostic information is  necessary to determine patient infection status.  Positive results do  not rule out bacterial infection or co-infection with other viruses. If result is PRESUMPTIVE POSTIVE SARS-CoV-2 nucleic acids MAY BE PRESENT.   A presumptive positive result was obtained on the submitted specimen  and confirmed on repeat testing.  While 2019 novel coronavirus  (SARS-CoV-2) nucleic acids may be present in the submitted sample  additional confirmatory testing may be necessary for epidemiological  and / or clinical management purposes  to differentiate between  SARS-CoV-2 and other Sarbecovirus currently known to infect humans.  If clinically indicated additional testing with an alternate test  methodology [phone]) is advised. The SARS-CoV-2 RNA is generally  detectable  in upper and lower respiratory sp ecimens during the acute  phase of infection. The expected result is Negative. Fact Sheet for Patients:  StrictlyIdeas.no Fact Sheet for Healthcare Providers: BankingDealers.co.za This test is not yet approved or cleared by the [place] FDA and has been authorized for detection and/or diagnosis of SARS-CoV-2 by FDA under an Emergency Use Authorization (EUA).  This EUA will remain in effect (meaning this test can be used) for the duration of the COVID-19 declaration under Section 564(b)(1) of the Act, 21 U.S.C. section 360bbb-3(b)(1), unless the authorization is terminated or revoked sooner. Performed at [facility], [place] [address], [place], [place] [address]   CBG monitoring, ED     Status: Abnormal   Collection Time: 06/07/18  1:06 AM  Result Value Ref Range   Glucose-Capillary 105 (H) 70 - 99 mg/dL  Comprehensive metabolic panel     Status: Abnormal   Collection Time: 06/07/18  3:53 AM  Result Value Ref Range   Sodium 140 135 - 145 mmol/L   Potassium 5.0 3.5 - 5.1 mmol/L   Chloride 95 (L) 98 - 111 mmol/L   CO2 29 22 - 32 mmol/L   Glucose, Bld 137 (H) 70 - 99 mg/dL   BUN 27 (H) 8 - 23 mg/dL   Creatinine, Ser 5.35 (H) 0.44 - 1.00 mg/dL   Calcium 9.3 8.9 - 10.3 mg/dL   Total Protein 5.4 (L) 6.5 - 8.1 g/dL   Albumin 3.1 (L) 3.5 - 5.0 g/dL   AST 49 (H) 15 - 41 U/L   ALT 64 (H) 0 - 44 U/L   Alkaline Phosphatase 150 (H) 38 - 126 U/L   Total Bilirubin 0.9 0.3 - 1.2 mg/dL   GFR calc non Af Amer 7 (L) >60 mL/min   GFR calc Af Amer 9 (L) >60 mL/min   Anion gap 16 (H) 5 - 15    Comment: Performed at [facility], [place] [address], [place], [place] [address]  Protime-INR     Status: None   Collection Time: 06/07/18  3:53 AM  Result Value Ref Range   Prothrombin Time 13.7 11.4 - 15.2 seconds   INR 1.1 0.8 - 1.2    Comment: (NOTE) INR goal varies based on device and disease  states. Performed at [facility], [place] [address] [place], [place] [address]   Lipid panel     Status: None   Collection Time: 06/07/18  3:53 AM  Result Value Ref Range   Cholesterol 137 0 - 200 mg/dL   Triglycerides 52 <150 mg/dL   HDL 58 >40  mg/dL   Total CHOL/HDL Ratio 2.4 RATIO   VLDL 10 0 - 40 mg/dL   LDL Cholesterol 69 0 - 99 mg/dL    Comment:        Total Cholesterol/HDL:CHD Risk Coronary Heart Disease Risk Table                     Men   Women  1/2 Average Risk   3.4   3.3  Average Risk       5.0   4.4  2 X Average Risk   9.6   7.1  3 X Average Risk  23.4   11.0        Use the calculated Patient Ratio above and the CHD Risk Table to determine the patient's CHD Risk.        ATP III CLASSIFICATION (LDL):  <100     mg/dL   Optimal  100-129  mg/dL   Near or Above                    Optimal  130-159  mg/dL   Borderline  160-189  mg/dL   High  >190     mg/dL   Very High Performed at [facility] [address] [place], [place] [address]   Glucose, capillary     Status: Abnormal   Collection Time: 06/07/18  4:55 AM  Result Value Ref Range   Glucose-Capillary 133 (H) 70 - 99 mg/dL  Glucose, capillary     Status: None   Collection Time: 06/07/18  8:34 AM  Result Value Ref Range   Glucose-Capillary 88 70 - 99 mg/dL   *Note: Due to a large number of results and/or encounters for the requested time period, some results have not been displayed. A complete set of results can be found in Results Review.    ROS: see hpi   Physical Exam: Vitals:   06/07/18 0131 06/07/18 0502  BP: 137/61 133/76  Pulse: (!) 103 100  Resp: 18 17  Temp: 98.6 F (37 C) 98 F (36.7 C)  SpO2: 95% 90%     General: Alert Female in Bed ,NAD, Pleasant and appropriate  HEENT:[place] , EOMI, Not icteric  Neck: supple ,no jvd Heart: Irreg irreg , VR stable ,no m,r,g  Lungs: CT anteriorly  Unlabored breathing  Abdomen: BS pos, s, Nt, ND  Extremities: bilat trace pedal edema  Skin:  no overt rash , warm dry Neuro: Alert Ox3, moves all extrem ,except R lwer exrtem, no acute focal deficits appreciated  Dialysis Access: Pos bruit LUA AVGG with some ecchymosis surrounding ("old from weeks ago")   Dialysis Orders: [facility]   on MWF . EDW 68  HD Bath 2k,2ca  Time 4 Heparin none. Access LUA AVG BFR 350  DFR 500    Calcitriol 0.47mcg po /HD  Last op  hgb 11.8  No esa    Assessment/Plan 1. R hip fx- noted on Eliquis for Afib/  Ortho plans hip surg tomor after  hold on  eliquis today 2. ESRD -  HD mwf  kand vol ok today , no needs today / HD tomor work around hip surgery schedule  3. Hypertension/volume  - bp /vol ok ,on Metop xl 25mg  / (also listed Torsemide will dc in ESRD pt) 4. Anemia  - hgb 11.9  No esa needs fu trend with Hip fx and surgery  5. Metabolic bone disease -  Po vit d on hd-  rocaltrol  / .binder with meals ( auryxia )  6. HO A.fib /- per admit , hold eliquis  With surgery  7. DM T2= per admit   Ernest Haber, PA-C [place] [phone] 06/07/2018, 9:58 AM   Patient seen and examined, agree with above note with above modifications. Established HD pt - does fairly well.  S/p mechanical fall well after dialysis no dizziness- now with hip fracture.  No acute needs for HD tonight- plan for routine HD tomorrow keep on MWF schedule.  Will coordinate with hip repair tomorrow Corliss Parish, MD 06/07/2018

## 2018-06-07 NOTE — Progress Notes (Signed)
PT Cancellation Note  Patient Details Name: Christine Santos MRN: [id number] DOB: 1945-03-14   Cancelled Treatment:    Reason Eval/Treat Not Completed: Active bedrest order. Pt currently with strict bed rest orders in place with plans for surgery on 5/15 in the PM per Ortho. PT will continue to f/u with pt acutely and await updated activity orders prior to initiating evaluation.    [place] 06/07/2018, 7:54 AM

## 2018-06-07 NOTE — TOC Initial Note (Signed)
Transition of Care [facility]) - Initial/Assessment Note    Patient Details  Name: Christine Santos MRN: [id number] Date of Birth: 1945/04/23  [facility] CM/SW Contact:    Carles Collet, RN Phone Number: 06/07/2018, 11:54 AM  Clinical Narrative:                Christine Santos w patient at bedside. She states that she was driving yesterday amd describes herself as independent. She goes to [facility] MWF for HD. SHe lives at home with her friend Christine Santos who is retired and is able to provide supervision if she returns at DC. She confirms that she was active w [facility] and had since been released. SHe was provided with Medicare ratings and would like to use them again if she does not need to go to SNF. She has DME RW rollator 3/1 at home. Patient on Eliquis washout and will have surgery on Friday.      Barriers to Discharge: Continued Medical Work up   Patient Goals and CMS Choice Patient states their goals for this hospitalization and ongoing recovery are:: unsure, she states she may need SNF. She is agreeable to SNF at DC if needed.  CMS Medicare.gov Compare Post Acute Care list provided to:: Patient Choice offered to / list presented to : Patient  Expected Discharge Plan and Services     Discharge Planning Services: CM Consult                                          Prior Living Arrangements/Services   Lives with:: Christine Santos)              Current home services: DME    Activities of Daily Living Home Assistive Devices/Equipment: Gilford Rile (specify type) ADL Screening (condition at time of admission) Patient's cognitive ability adequate to safely complete daily activities?: Yes Is the patient deaf or have difficulty hearing?: No Does the patient have difficulty seeing, even when wearing glasses/contacts?: No Does the patient have difficulty concentrating, remembering, or making decisions?: No Patient able to express need for assistance with ADLs?:  Yes Does the patient have difficulty dressing or bathing?: No Independently performs ADLs?: Yes (appropriate for developmental age) Does the patient have difficulty walking or climbing stairs?: Yes Weakness of Legs: Both Weakness of Arms/Hands: None  Permission Sought/Granted                  Emotional Assessment              Admission diagnosis:  Pain [R52] Closed fracture of neck of right femur, initial encounter ([place]) [S72.001A] Patient Active Problem List   Diagnosis Date Noted  . Displaced intertrochanteric fracture of right femur, initial encounter for closed fracture ([place]) 06/07/2018  . High risk social situation 02/05/2018  . Enteritis due to Clostridium difficile 07/02/2017  . History of stroke 01/20/2017  . Physical deconditioning 12/30/2016  . Transaminitis 12/23/2016  . Paroxysmal atrial fibrillation ([place]) 10/22/2015  . Laryngopharyngeal reflux (LPR) 09/14/2015  . Diabetes mellitus type 2 with neurological manifestations ([place]) 02/26/2014  . Diabetic nephropathy ([place]) 05/23/2013  . Diabetic neuropathy ([place]) 11/30/2012  . Macular degeneration 07/04/2012  . Diabetic retinopathy ([place]) 03/20/2012  . Osteopenia 09/22/2011  . Anemia in chronic kidney disease 08/29/2011  . Gout 04/13/2011  . Stress incontinence, female 11/11/2010  . Anxiety 08/18/2010  . Chronic systolic heart failure ([place])  02/18/2009  . SINUS BRADYCARDIA 10/29/2008  . Peripheral vascular disease ([place]) 06/18/2008  . ESRD (end stage renal disease) on dialysis ([place]) 07/13/2007  . Chronic lymphocytic leukemia ([place]) 10/13/2006  . 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 HYPERCHOLESTEROLEMIA 03/23/2006  . Essential hypertension 03/23/2006  . Coronary atherosclerosis 03/23/2006  . REFLUX ESOPHAGITIS 03/23/2006  . IRRITABLE BOWEL SYNDROME 03/23/2006  . CYSTOCELE/RECTOCELE/PROLAPSE,UNSPEC. 03/23/2006  . Osteoarthritis, multiple sites 03/23/2006   PCP:  [name], MD Pharmacy:   [facility] [place], [place] [address] Phone: [phone] Fax: [phone]  [place], [place] - [address] [facility]. [address] [place] [address] Phone: [phone] Fax: [phone]  [id number] - [place], [place] [address] [place] [address] Phone: [phone] Fax: [phone]     Social Determinants of Health (SDOH) Interventions    Readmission Risk Interventions No flowsheet data found.

## 2018-06-07 NOTE — Consult Note (Signed)
ORTHOPAEDIC CONSULTATION  REQUESTING PHYSICIAN: [name], MD  Chief Complaint: Right intertrochanteric hip fracture  HPI: Christine Santos is a 73 y.o. female who presents with right basicervical/intertrochanteric fracture s/p mechanical fall at home.  The patient endorses severe pain in the right hip, that does not radiate, grinding in quality, worse with any movement, better with immobilization.  Denies LOC/fever/chills/nausea/vomiting.  Denies LOC, neck pain, abd pain.  Past Medical History:  Diagnosis Date  . Anemia 08/29/2011  . Anxiety   . Arthritis    "legs" (01/22/2013)  . CAD (coronary artery disease)    s/p CABG in 1999  . Cataract   . Chronic kidney disease    Sees Dr [name]  . 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 Diastolic heart failure ([place])   . Dysrhythmia    PAF  . GERD (gastroesophageal reflux disease)   . Gout   . H/O hiatal hernia   . Headache    "years ago migraines, none in along time."  . Heart murmur   . Hyperlipidemia   . Hypertension   . 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 Myocardial infarction ([place])    before 1995  . PAD (peripheral artery disease) ([place])   . Paroxysmal atrial fibrillation (HCC)    a. identified on ILR as part of StrokeAF study  . Peripheral vascular disease ([place])   . Pneumonia 11/2016  . 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 Still has balance issues 03/05/2018- vision improved, balance improved , back on walker since hospitaization,    Past Surgical History:  Procedure Laterality Date  . A/V FISTULAGRAM Left 12/25/2017   Procedure: A/V FISTULAGRAM;  Surgeon: Waynetta Sandy, MD;  Location: [facility];  Service: Cardiovascular;  Laterality: Left;  . A/V SHUNT INTERVENTION Left 12/25/2017   Procedure: A/V SHUNT INTERVENTION;  Surgeon: Waynetta Sandy, MD;  Location: [facility];  Service:  Cardiovascular;  Laterality: Left;  . ANGIOPLASTY / STENTING FEMORAL Right 09/2010   SFA/notes 11/25/2010 (01/22/2013)  . ANGIOPLASTY / STENTING FEMORAL Left 11/2010   SFA/notes 11/25/2010 (01/22/2013)  . ANGIOPLASTY / STENTING ILIAC     [name] 11/25/2010 (01/22/2013)  . AV FISTULA PLACEMENT Left 03/07/2018   Procedure: CONVERSION TO  ARTERIOVENOUS Artegraft GRAFT ARM;  Surgeon: Waynetta Sandy, MD;  Location: [facility];  Service: Vascular;  Laterality: Left;  . BASCILIC VEIN TRANSPOSITION Left 08/23/2017   Procedure: FIRST STAGE BASILIC VEIN TRANSPOSITION LEFT UPPER EXTREMITY;  Surgeon: [name], MD;  Location: [facility];  Service: Vascular;  Laterality: Left;  . BASCILIC VEIN TRANSPOSITION Left 10/23/2017   Procedure: SECOND STAGE BASILIC VEIN TRANSPOSITION LEFT ARM;  Surgeon: [name], MD;  Location: [facility];  Service: Vascular;  Laterality: Left;  . CATARACT EXTRACTION W/ INTRAOCULAR LENS  IMPLANT, BILATERAL Bilateral ?2011  . CHOLECYSTECTOMY  1993  . CORONARY ARTERY BYPASS GRAFT  10/1994   "CABG X3"  . EP IMPLANTABLE DEVICE N/A 09/11/2015   Procedure: Loop Recorder Insertion;  Surgeon: Thompson Grayer, MD;  Location: [facility];  Service: Cardiovascular;  Laterality: N/A;  . HEEL SPUR EXCISION Bilateral 1970's  . IR FLUORO GUIDE CV LINE RIGHT  11/01/2017  . IR FLUORO GUIDE CV LINE RIGHT  11/05/2017  . IR US GUIDE VASC ACCESS RIGHT  11/01/2017  . IR US GUIDE VASC ACCESS RIGHT  11/05/2017  .  LOWER EXTREMITY ANGIOGRAM N/A 01/22/2013   Procedure: LOWER EXTREMITY ANGIOGRAM;  Surgeon: [name], MD;  Location: [facility];  Service: Cardiovascular;  Laterality: N/A;  . SHOULDER OPEN ROTATOR CUFF REPAIR Bilateral 1990's   "2 times on 1 side; once on the other"  . TRANSLUMINAL ATHERECTOMY FEMORAL ARTERY Right 01/22/2013   & balloon  . TUBAL LIGATION  1980's  . VIDEO ASSISTED THORACOSCOPY (VATS)/EMPYEMA Left 11/29/2016   Procedure: VIDEO ASSISTED THORACOSCOPY  (VATS)/EMPYEMA;  Surgeon: [name], MD;  Location: [facility];  Service: Thoracic;  Laterality: Left;  VIDEO ASSISTED THORACOSCOPY (VATS)/EMPYEMA   Social History   Socioeconomic History  . Marital status: Divorced    Spouse name: Not on file  . Number of children: 2  . Years of education: 22  . Highest education level: Not on file  Occupational History  . Occupation: DISABILITY    Employer: OTHER    Comment: Worked at [employer]  . Financial resource strain: Not on file  . Food insecurity:    Worry: Not on file    Inability: Not on file  . Transportation needs:    Medical: Not on file    Non-medical: Not on file  Tobacco Use  . Smoking status: Never Smoker  . Smokeless tobacco: Never Used  Substance and Sexual Activity  . Alcohol use: No  . Drug use: No  . Sexual activity: Not Currently  Lifestyle  . Physical activity:    Days per week: Not on file    Minutes per session: Not on file  . Stress: Not on file  Relationships  . Social connections:    Talks on phone: Not on file    Gets together: Not on file    Attends religious service: Not on file    Active member of club or organization: Not on file    Attends meetings of clubs or organizations: Not on file    Relationship status: Not on file  Other Topics Concern  . Not on file  Social History Narrative   Health Care POA:    Emergency Contact: sister, [name] (h) [phone]   End of Life Plan:    Who lives with you: female friend   Any pets: none   Diet: Pt has a varied diet of protein, starch, vegetables   Exercise: Pt does not have any regular exercise routine.   Seatbelts: Pt reports wearing seatbelt when in vehicles.    Sun Exposure/Protection: Pt reports using sun screen.   Hobbies: going to lake, biking, blue grass music      Current Social History 08/16/2016        Patient lives with [name] (Significant other) in one level home 08/16/2016   Transportation: Patient has own  vehicle 08/16/2016   Important Relationships [name] and sisters, [name] and Vaughan Basta 08/16/2016    Pets: None 08/16/2016   Education / Work:  12 th grade/ Disabled ([employer]) 08/16/2016   Interests / Fun: Going to [facility], out to eat with friends 08/16/2016   Current Stressors: Bilat foot pain (neuropathy) 08/16/2016   Religious / Personal Beliefs: Raised Methodist, goes to [employer] 08/16/2016   L. Silvano Rusk, RN, BSN  Family History  Problem Relation Age of Onset  . Heart disease Mother   . Heart attack Mother   . Hypertension Mother   . Kidney disease Mother   . Pneumonia Father   . Heart attack Father   . Hyperlipidemia Father   . Diabetes Sister   . Breast cancer Sister   . Arthritis Brother   . Diabetes Brother   . Heart attack Sister   . Obesity Sister   . Hypertension Sister   . Heart attack Sister   . Cancer Sister        breast  . Hyperlipidemia Sister   . Hypertension Sister   . Hyperlipidemia Sister   . Breast cancer Maternal Grandmother    Allergies  Allergen Reactions  . Cefepime Anaphylaxis    Throat swelling  . Amoxicillin Rash    Did it involve swelling of the face/tongue/throat, SOB, or low BP? No Did it involve sudden or severe rash/hives, skin peeling, or any reaction on the inside of your mouth or nose? No Did you need to seek medical attention at a hospital or doctor's office? No When did it last happen?10+ years If all above answers are "NO", may proceed with cephalosporin use.    . Codeine Phosphate Rash  . Penicillins Rash    Did it involve swelling of the face/tongue/throat, SOB, or low BP? No Did it involve sudden or severe rash/hives, skin peeling, or any reaction on the inside of your mouth or nose? No Did you need to seek medical attention at a hospital or doctor's office? No When did it last happen?10+ years If all  above answers are "NO", may proceed with cephalosporin use.     Marland Kitchen Propoxyphene Rash   Prior to Admission medications   Medication Sig Start Date End Date Taking? Authorizing Provider  acetaminophen (TYLENOL) 500 MG tablet Take 1,000 mg by mouth every 6 (six) hours as needed for moderate pain or headache.   Yes [provider]  allopurinol (ZYLOPRIM) 100 MG tablet Take 0.5 tablets (50 mg total) by mouth daily. 11/06/17  Yes Anderson, Chelsey L, DO  apixaban (ELIQUIS) 5 MG TABS tablet Take 1 tablet (5 mg total) by mouth 2 (two) times daily. 05/29/18  Yes [name], MD  Ascorbic Acid (VITAMIN C) 1000 MG tablet Take 1,000 mg by mouth daily.   Yes [provider]  carboxymethylcellulose (REFRESH PLUS) 0.5 % SOLN Place 1 drop into both eyes 3 (three) times daily.    Yes [provider]  ferric citrate (AURYXIA) 1 GM 210 MG(Fe) tablet Take 420 mg by mouth 3 (three) times daily with meals.   Yes [provider]  GNP GARLIC EXTRACT PO Take 1 tablet by mouth daily.    Yes [provider]  HUMALOG KWIKPEN 100 UNIT/ML KwikPen INJECT 5 UNITS UNDER THE SKIN THREE TIMES DAILY BEFORE MEALS Patient taking differently: Inject 5 Units into the skin 3 (three) times daily.  03/07/18  Yes Hensel, Jamal Collin, MD  Insulin Glargine (LANTUS SOLOSTAR) 100 UNIT/ML Solostar Pen Inject 5-12 Units into the skin daily. Patient taking differently: Inject 12 Units into the skin daily.  03/07/18  Yes Rhyne, Hulen Shouts, PA-C  Melatonin 10 MG CAPS Take 10 mg by mouth at bedtime.    Yes [provider]  metoprolol succinate (TOPROL-XL) 25 MG 24 hr tablet Take 1 tablet (25 mg total) by mouth daily. 02/05/18  Yes Hensel, Jamal Collin, MD  Multiple Vitamins-Minerals (EYE VITAMINS PO) Take  1 capsule by mouth 2 (two) times daily. Taking one daily from her opthamologist (Dr. Herbert Deaner)    Yes [provider]  Multiple Vitamins-Minerals (MULTIVITAMINS THER. W/MINERALS) TABS  Take 1 tablet by mouth daily.     Yes [provider]  Omega-3 Fatty Acids (FISH OIL) 1000 MG CAPS Take 1,000 mg by mouth daily.   Yes [provider]  ondansetron (ZOFRAN) 4 MG tablet Take 1 tablet (4 mg total) by mouth every 8 (eight) hours as needed for nausea or vomiting. 10/11/17  Yes Carlyle Basques, MD  pantoprazole (PROTONIX) 40 MG tablet TAKE 1 TABLET BY MOUTH DAILY Patient taking differently: Take 40 mg by mouth at bedtime.  05/29/17  Yes Hensel, Jamal Collin, MD  PARoxetine (PAXIL) 40 MG tablet Take 1 tablet (40 mg total) by mouth every morning. Patient taking differently: Take 40 mg by mouth daily.  10/18/17  Yes Hensel, Jamal Collin, MD  rosuvastatin (CRESTOR) 20 MG tablet TAKE 1 TABLET(20 MG) BY MOUTH DAILY Patient taking differently: Take 20 mg by mouth daily.  05/31/18  Yes Hensel, Jamal Collin, MD  traMADol (ULTRAM) 50 MG tablet Take 1 tablet (50 mg total) by mouth every 6 (six) hours as needed (for pain.). 12/29/17  Yes Hensel, Jamal Collin, MD  ACCU-CHEK AVIVA PLUS test strip TEST THREE TIMES A DAY 04/26/17   [name], MD   Dg Chest 1 View  Result Date: 06/06/2018 CLINICAL DATA:  Fall EXAM: CHEST  1 VIEW COMPARISON:  01/10/2018 FINDINGS: Moderate cardiomegaly. Remote median sternotomy and CABG. No focal airspace consolidation or pulmonary edema. No pleural effusion or pneumothorax. IMPRESSION: Cardiomegaly without focal airspace disease. Electronically Signed   By: Ulyses Jarred M.D.   On: 06/06/2018 22:25   Dg Hip Unilat With Pelvis 2-3 Views Right  Result Date: 06/06/2018 CLINICAL DATA:  Fall EXAM: DG HIP (WITH OR WITHOUT PELVIS) 2-3V RIGHT COMPARISON:  None. FINDINGS: Medially angulated fracture at the base of the right femoral neck femoral head remains positioned in the acetabulum. No pelvic fracture. IMPRESSION: Medially angulated fracture of the base of the right femoral neck. Electronically Signed   By: Ulyses Jarred M.D.   On: 06/06/2018 22:21    All pertinent  xrays, MRI, CT independently reviewed and interpreted  Positive ROS: All other systems have been reviewed and were otherwise negative with the exception of those mentioned in the HPI and as above.  Physical Exam: General: Alert, no acute distress Cardiovascular: No pedal edema Respiratory: No cyanosis, no use of accessory musculature GI: No organomegaly, abdomen is soft and non-tender Skin: No lesions in the area of chief complaint Neurologic: Sensation intact distally Psychiatric: Patient is competent for consent with normal mood and affect Lymphatic: No axillary or cervical lymphadenopathy  MUSCULOSKELETAL:  - severe pain with movement of the hip and extremity - skin intact - NVI distally - compartments soft  Assessment: Right basicervical/intertrochanteric fracture  Plan: - surgical stabilization is recommended, patient and family are aware of r/b/a and wish to proceed, informed consent obtained - medical optimization per primary team - surgery is planned for Friday afternoon given last dose of eliquis was wed am - patient understands that she's at higher risk for surgery and anesthesia given her comorbidities but morbidity is also high without surgical stabilization for hip fractures  Thank you for the consult and the opportunity to see Ms. Arvin Collard, MD [facility] [phone] 7:35 AM

## 2018-06-07 NOTE — Progress Notes (Signed)
Renal Navigator notified OP HD clinic/East of patient's admission, upcoming surgery, and negative COVID rapid test result to provide continuity of care and safety.  Alphonzo Cruise Renal Navigator [phone]

## 2018-06-08 ENCOUNTER — Inpatient Hospital Stay (HOSPITAL_COMMUNITY): Payer: Medicare Other

## 2018-06-08 ENCOUNTER — Inpatient Hospital Stay (HOSPITAL_COMMUNITY): Payer: Medicare Other | Admitting: Anesthesiology

## 2018-06-08 ENCOUNTER — Encounter (HOSPITAL_COMMUNITY)
Admission: EM | Disposition: A | Discharge status: Rehabilitation Facility | Payer: Self-pay | Source: Home / Self Care | Attending: Family Medicine

## 2018-06-08 ENCOUNTER — Encounter (HOSPITAL_COMMUNITY): Payer: Self-pay | Admitting: Family Medicine

## 2018-06-08 DIAGNOSIS — S72001A Fracture of unspecified part of neck of right femur, initial encounter for closed fracture: Secondary | ICD-10-CM

## 2018-06-08 DIAGNOSIS — S72141A Displaced intertrochanteric fracture of right femur, initial encounter for closed fracture: Principal | ICD-10-CM

## 2018-06-08 DIAGNOSIS — E1121 Type 2 diabetes mellitus with diabetic nephropathy: Secondary | ICD-10-CM

## 2018-06-08 DIAGNOSIS — C9110 Chronic lymphocytic leukemia of B-cell type not having achieved remission: Secondary | ICD-10-CM

## 2018-06-08 DIAGNOSIS — I5022 Chronic systolic (congestive) heart failure: Secondary | ICD-10-CM

## 2018-06-08 DIAGNOSIS — D631 Anemia in chronic kidney disease: Secondary | ICD-10-CM

## 2018-06-08 HISTORY — PX: INTRAMEDULLARY (IM) NAIL INTERTROCHANTERIC: SHX5875

## 2018-06-08 LAB — GLUCOSE, CAPILLARY
Glucose-Capillary: 126 mg/dL — ABNORMAL HIGH (ref 70–99)
Glucose-Capillary: 146 mg/dL — ABNORMAL HIGH (ref 70–99)
Glucose-Capillary: 151 mg/dL — ABNORMAL HIGH (ref 70–99)
Glucose-Capillary: 151 mg/dL — ABNORMAL HIGH (ref 70–99)
Glucose-Capillary: 152 mg/dL — ABNORMAL HIGH (ref 70–99)

## 2018-06-08 LAB — CBC
HCT: 38.7 % (ref 36.0–46.0)
Hemoglobin: 12.2 g/dL (ref 12.0–15.0)
MCH: 31 pg (ref 26.0–34.0)
MCHC: 31.5 g/dL (ref 30.0–36.0)
MCV: 98.2 fL (ref 80.0–100.0)
Platelets: 151 10*3/uL (ref 150–400)
RBC: 3.94 MIL/uL (ref 3.87–5.11)
RDW: 19.7 % — ABNORMAL HIGH (ref 11.5–15.5)
WBC: 14.9 10*3/uL — ABNORMAL HIGH (ref 4.0–10.5)
nRBC: 0 % (ref 0.0–0.2)

## 2018-06-08 LAB — RENAL FUNCTION PANEL
Albumin: 2.9 g/dL — ABNORMAL LOW (ref 3.5–5.0)
Anion gap: 16 — ABNORMAL HIGH (ref 5–15)
BUN: 46 mg/dL — ABNORMAL HIGH (ref 8–23)
CO2: 26 mmol/L (ref 22–32)
Calcium: 8.9 mg/dL (ref 8.9–10.3)
Chloride: 94 mmol/L — ABNORMAL LOW (ref 98–111)
Creatinine, Ser: 7.34 mg/dL — ABNORMAL HIGH (ref 0.44–1.00)
GFR calc Af Amer: 6 mL/min — ABNORMAL LOW (ref >60)
GFR calc non Af Amer: 5 mL/min — ABNORMAL LOW (ref >60)
Glucose, Bld: 168 mg/dL — ABNORMAL HIGH (ref 70–99)
Phosphorus: 6.7 mg/dL — ABNORMAL HIGH (ref 2.5–4.6)
Potassium: 6.2 mmol/L — ABNORMAL HIGH (ref 3.5–5.1)
Sodium: 136 mmol/L (ref 135–145)

## 2018-06-08 LAB — SURGICAL PCR SCREEN
MRSA, PCR: NEGATIVE
Staphylococcus aureus: POSITIVE — AB

## 2018-06-08 SURGERY — FIXATION, FRACTURE, INTERTROCHANTERIC, WITH INTRAMEDULLARY ROD
Anesthesia: General | Site: Hip | Laterality: Right

## 2018-06-08 MED ORDER — ETOMIDATE 2 MG/ML IV SOLN
INTRAVENOUS | Status: AC
  Filled 2018-06-08: qty 10

## 2018-06-08 MED ORDER — ENSURE PRE-SURGERY PO LIQD: 296.0000 mL | Freq: Once | ORAL | Status: DC

## 2018-06-08 MED ORDER — DEXAMETHASONE SODIUM PHOSPHATE 10 MG/ML IJ SOLN
INTRAMUSCULAR | Status: AC
  Filled 2018-06-08: qty 1

## 2018-06-08 MED ORDER — PROMETHAZINE HCL 25 MG/ML IJ SOLN
6.2500 mg | INTRAMUSCULAR | Status: DC | PRN
  Administered 2018-06-08: 6.25 mg via INTRAVENOUS

## 2018-06-08 MED ORDER — SUCCINYLCHOLINE CHLORIDE 200 MG/10ML IV SOSY
PREFILLED_SYRINGE | INTRAVENOUS | Status: AC
  Filled 2018-06-08: qty 10

## 2018-06-08 MED ORDER — 0.9 % SODIUM CHLORIDE (POUR BTL) OPTIME
TOPICAL | Status: DC | PRN
  Administered 2018-06-08: 1000 mL

## 2018-06-08 MED ORDER — SODIUM CHLORIDE 0.9 % IV SOLN
INTRAVENOUS | Status: DC
  Administered 2018-06-08: 14:00:00 via INTRAVENOUS

## 2018-06-08 MED ORDER — ONDANSETRON HCL 4 MG/2ML IJ SOLN
INTRAMUSCULAR | Status: DC | PRN
  Administered 2018-06-08: 4 mg via INTRAVENOUS

## 2018-06-08 MED ORDER — NEOSTIGMINE METHYLSULFATE 10 MG/10ML IV SOLN
INTRAVENOUS | Status: DC | PRN
  Administered 2018-06-08: 3 mg via INTRAVENOUS

## 2018-06-08 MED ORDER — LIDOCAINE 2% (20 MG/ML) 5 ML SYRINGE
INTRAMUSCULAR | Status: AC
  Filled 2018-06-08: qty 5

## 2018-06-08 MED ORDER — SUGAMMADEX SODIUM 500 MG/5ML IV SOLN
INTRAVENOUS | Status: AC
  Filled 2018-06-08: qty 5

## 2018-06-08 MED ORDER — GLYCOPYRROLATE 0.2 MG/ML IJ SOLN
INTRAMUSCULAR | Status: DC | PRN
  Administered 2018-06-08: 0.4 mg via INTRAVENOUS

## 2018-06-08 MED ORDER — PROMETHAZINE HCL 25 MG/ML IJ SOLN
INTRAMUSCULAR | Status: AC
  Filled 2018-06-08: qty 1

## 2018-06-08 MED ORDER — WHITE PETROLATUM EX OINT
TOPICAL_OINTMENT | CUTANEOUS | Status: AC
  Administered 2018-06-08: 0.2
  Filled 2018-06-08: qty 28.35

## 2018-06-08 MED ORDER — PROPOFOL 10 MG/ML IV BOLUS
INTRAVENOUS | Status: AC
  Filled 2018-06-08: qty 20

## 2018-06-08 MED ORDER — ACETAMINOPHEN 10 MG/ML IV SOLN
INTRAVENOUS | Status: AC
  Filled 2018-06-08: qty 100

## 2018-06-08 MED ORDER — ALBUMIN HUMAN 5 % IV SOLN
INTRAVENOUS | Status: DC | PRN
  Administered 2018-06-08: 15:00:00 via INTRAVENOUS

## 2018-06-08 MED ORDER — HYDROMORPHONE HCL 1 MG/ML IJ SOLN
INTRAMUSCULAR | Status: AC
  Filled 2018-06-08: qty 0.5

## 2018-06-08 MED ORDER — MUPIROCIN 2 % EX OINT
1.0000 "application " | TOPICAL_OINTMENT | Freq: Two times a day (BID) | CUTANEOUS | Status: AC
  Administered 2018-06-08 – 2018-06-12 (×10): 1 via NASAL
  Filled 2018-06-08 (×4): qty 22

## 2018-06-08 MED ORDER — SODIUM CHLORIDE 0.9 % IV SOLN
INTRAVENOUS | Status: DC | PRN
  Administered 2018-06-08: 50 ug/min via INTRAVENOUS

## 2018-06-08 MED ORDER — CHLORHEXIDINE GLUCONATE CLOTH 2 % EX PADS
6.0000 | MEDICATED_PAD | Freq: Every day | CUTANEOUS | Status: DC
  Administered 2018-06-09 – 2018-06-12 (×4): 6 via TOPICAL

## 2018-06-08 MED ORDER — ACETAMINOPHEN 10 MG/ML IV SOLN
1000.0000 mg | Freq: Once | INTRAVENOUS | Status: DC | PRN
  Administered 2018-06-08: 1000 mg via INTRAVENOUS

## 2018-06-08 MED ORDER — CALCITRIOL 0.25 MCG PO CAPS
ORAL_CAPSULE | ORAL | Status: AC
  Filled 2018-06-08: qty 1

## 2018-06-08 MED ORDER — FENTANYL CITRATE (PF) 100 MCG/2ML IJ SOLN
INTRAMUSCULAR | Status: DC | PRN
  Administered 2018-06-08 (×2): 50 ug via INTRAVENOUS

## 2018-06-08 MED ORDER — HYDROMORPHONE HCL 1 MG/ML IJ SOLN
INTRAMUSCULAR | Status: AC
  Filled 2018-06-08: qty 1

## 2018-06-08 MED ORDER — PHENYLEPHRINE 40 MCG/ML (10ML) SYRINGE FOR IV PUSH (FOR BLOOD PRESSURE SUPPORT)
PREFILLED_SYRINGE | INTRAVENOUS | Status: AC
  Filled 2018-06-08: qty 10

## 2018-06-08 MED ORDER — ONDANSETRON HCL 4 MG/2ML IJ SOLN
INTRAMUSCULAR | Status: AC
  Filled 2018-06-08: qty 2

## 2018-06-08 MED ORDER — FENTANYL CITRATE (PF) 250 MCG/5ML IJ SOLN
INTRAMUSCULAR | Status: AC
  Filled 2018-06-08: qty 5

## 2018-06-08 MED ORDER — PROPOFOL 10 MG/ML IV BOLUS
INTRAVENOUS | Status: DC | PRN
  Administered 2018-06-08: 100 mg via INTRAVENOUS

## 2018-06-08 MED ORDER — VECURONIUM BROMIDE 10 MG IV SOLR
INTRAVENOUS | Status: DC | PRN
  Administered 2018-06-08: 7 mg via INTRAVENOUS

## 2018-06-08 MED ORDER — HYDROMORPHONE HCL 1 MG/ML IJ SOLN
0.2500 mg | INTRAMUSCULAR | Status: DC | PRN
  Administered 2018-06-08: 0.25 mg via INTRAVENOUS

## 2018-06-08 SURGICAL SUPPLY — 47 items
BIT DRILL SHORT 4.0 (BIT) IMPLANT
BNDG COHESIVE 4X5 TAN NS LF (GAUZE/BANDAGES/DRESSINGS) ×2 IMPLANT
BNDG COHESIVE 6X5 TAN STRL LF (GAUZE/BANDAGES/DRESSINGS) IMPLANT
BNDG GAUZE ELAST 4 BULKY (GAUZE/BANDAGES/DRESSINGS) ×2 IMPLANT
COVER PERINEAL POST (MISCELLANEOUS) ×2 IMPLANT
COVER SURGICAL LIGHT HANDLE (MISCELLANEOUS) ×2 IMPLANT
COVER WAND RF STERILE (DRAPES) ×2 IMPLANT
DRAPE STERI IOBAN 125X83 (DRAPES) ×2 IMPLANT
DRILL BIT SHORT 4.0 (BIT) ×1
DRSG AQUACEL AG ADV 3.5X 4 (GAUZE/BANDAGES/DRESSINGS) ×1 IMPLANT
DRSG AQUACEL AG ADV 3.5X 6 (GAUZE/BANDAGES/DRESSINGS) ×2 IMPLANT
DRSG MEPILEX BORDER 4X4 (GAUZE/BANDAGES/DRESSINGS) ×2 IMPLANT
DRSG MEPILEX BORDER 4X8 (GAUZE/BANDAGES/DRESSINGS) ×2 IMPLANT
DRSG PAD ABDOMINAL 8X10 ST (GAUZE/BANDAGES/DRESSINGS) ×4 IMPLANT
DURAPREP 26ML APPLICATOR (WOUND CARE) ×2 IMPLANT
ELECT REM PT RETURN 9FT ADLT (ELECTROSURGICAL) ×2
ELECTRODE REM PT RTRN 9FT ADLT (ELECTROSURGICAL) ×1 IMPLANT
GLOVE BIOGEL PI IND STRL 7.0 (GLOVE) ×1 IMPLANT
GLOVE BIOGEL PI INDICATOR 7.0 (GLOVE) ×1
GLOVE ECLIPSE 7.0 STRL STRAW (GLOVE) ×2 IMPLANT
GLOVE SKINSENSE NS SZ7.5 (GLOVE) ×2
GLOVE SKINSENSE STRL SZ7.5 (GLOVE) ×2 IMPLANT
GOWN STRL REIN XL XLG (GOWN DISPOSABLE) ×2 IMPLANT
GUIDE PIN 3.2X343 (PIN) ×2
GUIDE PIN 3.2X343MM (PIN) ×2
GUIDE ROD 3.0 (MISCELLANEOUS) ×2
KIT BASIN OR (CUSTOM PROCEDURE TRAY) ×2 IMPLANT
KIT TURNOVER KIT B (KITS) ×2 IMPLANT
MANIFOLD NEPTUNE II (INSTRUMENTS) ×2 IMPLANT
NAIL RIGHT 10X38MM (Nail) ×1 IMPLANT
NS IRRIG 1000ML POUR BTL (IV SOLUTION) ×2 IMPLANT
PACK GENERAL/GYN (CUSTOM PROCEDURE TRAY) ×2 IMPLANT
PAD ARMBOARD 7.5X6 YLW CONV (MISCELLANEOUS) ×4 IMPLANT
PAD CAST 4YDX4 CTTN HI CHSV (CAST SUPPLIES) ×2 IMPLANT
PADDING CAST COTTON 4X4 STRL (CAST SUPPLIES) ×2
PIN GUIDE 3.2X343MM (PIN) IMPLANT
ROD GUIDE 3.0 (MISCELLANEOUS) IMPLANT
SCREW LAG COMPR KIT 85/80 (Screw) ×1 IMPLANT
SCREW TRIGEN LOW PROF 5.0X35 (Screw) ×1 IMPLANT
STAPLER VISISTAT 35W (STAPLE) ×2 IMPLANT
SUT VIC AB 0 CT1 27 (SUTURE) ×1
SUT VIC AB 0 CT1 27XBRD ANBCTR (SUTURE) ×1 IMPLANT
SUT VIC AB 2-0 CT1 27 (SUTURE) ×1
SUT VIC AB 2-0 CT1 TAPERPNT 27 (SUTURE) ×1 IMPLANT
TOWEL OR 17X24 6PK STRL BLUE (TOWEL DISPOSABLE) ×2 IMPLANT
TOWEL OR 17X26 10 PK STRL BLUE (TOWEL DISPOSABLE) ×2 IMPLANT
WATER STERILE IRR 1000ML POUR (IV SOLUTION) ×2 IMPLANT

## 2018-06-08 NOTE — Progress Notes (Signed)
OT Cancellation Note  Patient Details Name: Christine Santos MRN: [id number] DOB: Aug 20, 1945   Cancelled Treatment:    Reason Eval/Treat Not Completed: Active bedrest order. Will continue to follow acutely for evaluation when pt is medically ready and bed rest orders are lifted.   Darrol Jump OTR/L 06/08/2018, 8:39 AM

## 2018-06-08 NOTE — Progress Notes (Signed)
Patient returned from hemodialysis.

## 2018-06-08 NOTE — Progress Notes (Addendum)
Family Medicine Teaching Service Daily Progress Note Intern Pager: [phone]  Patient name: Christine Santos Medical record number: [id number] Date of birth: 1945-05-25 Age: 73 y.o. Gender: female  Primary Care Provider: [name], MD Consultants: Orthopedics Code Status: Full code  Pt Overview and Major Events to Date:  5/13: admitted to [facility] for R femur fracture 5/15: ORIF of right femur  Assessment and Plan: Christine Santos is a 73 y.o. female who presented after a fall and subsequent displaced intertrochanteric fracture of right femur. PMHx significant for IBS, reflux, diabetic neuropathy, IDDM, paroxysmal A. fib on Eliquis, HTN, systolic heart failure, PVD, sinus bradycardia, CAD, stress incontinence, ESRD on HD MWF, secondary hyperparathyroidism, ACD.  Right femur fracture s/p ORIF: POD#1 s/p ORIF on 8/78 without complication. No signs of hematoma, bleeding, or infection. Pain well controlled. Only required dilaudid x1 ON.  Hgb stable 12.2>11.8. Tolerating PO. Ambulated last night without difficulty. Given major surgery and high risk for bleed but also high risk of coagulation, anticoganulation held overnight. Given some concern for ischemia of LUE (see problem below) will plan to start Heparin today and Eleiquis on POD#2. - Heparin per pharmacy, plan to restart Eliquis POD #2 - monitor for hematoma, signs of infection - Ortho recs: weight bearing as tolerated, follow up in 2 weeks outpatient for stable removal - PT/OT - Transition from IV Dilaudid to Mabel for pain score 4-6 and 7.5-325 for pain score 7-10 q4 PRN - continue to hold home PRN tramadol - continue to work with PT/OT - fall precautions - incentive spirometry, O2 PRN  - weight bearing as tolerated, up with assistance - reinforce dressing  Concern for LUE Ischemia: LUE cold to palpation (RUE warm), unable to palpate radial pulses, and delayed capillary refill. Concern for ischemia 2/2 to Steal Syndrome given  AVG in LUE vs embolism given A-fib currently not on anticoagulation. Strength and sensation intact bilaterally. Palpable thrill appreciated at AVG. - STAT vascular dublex of LUE - consult VVS  ESRD with HD MWF  ACD  2` Hyperparathyroidism: HD on 6/76 without complications. K 6.2>5.8. Hgb 12.2>11.8. - HD and electrolytes per nephro - continue to monitor K+, consider treatment if continues to remain elevated or becomes symptomatic - Daily RFP - Continue home calcitriol and Auryxia - Renal diet - Avoid continuous fluids  A.Fib  Prolonged QTc on EKG: Home meds: Metoprolol XL 25mg  QD, Eliquis 5mg . - follow up AM EKG - Continue home Metoprolol - Hold home Elqiuis - Begin Heparin, plan to start Eliquis POD#2 - Telemetry, daily EKG - Avoid QT prolonging agents  IDDM: Home meds: Lantus 12U and Humalog 5U TID with meals. CBG ON 120-177. - sSSI - Begin 6U Lantus qAM - CBG with meals  HTN:  BP this AM 90's/50's. Home meds: Hydralazine 50mg  QD and Metoprolol 25mg  QD - Continue home Metoprolol  - Continue to hold home hydral given soft BP's  CHF:  Last echo 2017, EF 55-60%. Home meds: Metoprolol XL 25mg  QD.  - Continue home Metoprolol 25mg  QD  Depression: Home meds: Paxil 40mg  QD - continue home meds  HLD:  - Continue home Rosuvastatin 20mg   GERD:  - Continue home Protonix 40mg   Gout:  - Restart home Allopurinol 50mg  QD  Insomnia:  - Can restart home Melatonin 10mg  qHS PRN for sleep if requested  FEN/GI: Renal diet PPx: Lovenox  Disposition: Pending PT/OT recommendations When medically stable    Subjective:  Patient doing well this AM. Notes she walked to  the hallway with her friend Christine Santos without too much difficulty or pain. Had some pain overnight relieved with Dilaudid. Has been tolerating PO without nausea or vomiting. Denies chest pain or SOB.   Objective: Temp:  [97.2 F (36.2 C)-99.3 F (37.4 C)] 98.2 F (36.8 C) (05/16 0424) Pulse Rate:  [66-116] 66  (05/16 0424) Resp:  [14-20] 14 (05/16 0424) BP: (94-157)/(49-110) 94/55 (05/16 0424) SpO2:  [92 %-100 %] 94 % (05/16 0424) Weight:  [69.1 kg] 69.1 kg (05/15 1342) Physical Exam: General: pleasant elderly woman, well nourished, well developed, in no acute distress with non-toxic appearance, resting comfortably in bed HEENT: normocephalic, atraumatic, moist mucous membranes Neck: supple, normal ROM CV: irregularly irregular, no lower extremity edema, 2+ pedal pulses bilaterally, difficult to palpate radial pulses, delayed capillary refill in LUE, palpable thill in LUE at site of AVG Lungs: clear to auscultation bilaterally with normal work of breathing on 2L O2 Abdomen: soft, non-tender, non-distended, normoactive bowel sounds Skin: warm, dry, bandages on RUE clean and intact, no signs of infection, nontender to light palpation Extremities: LUE very cold to touch Neuro: Alert and orientedx3, speech normal  Laboratory: Recent Labs  Lab 06/06/18 2059 06/08/18 0801 06/09/18 0430  WBC 12.4* 14.9* 17.6*  HGB 11.9* 12.2 11.8*  HCT 38.4 38.7 38.6  PLT 143* 151 133*   Recent Labs  Lab 06/07/18 0353 06/08/18 0802 06/09/18 0430  NA 140 136 137  K 5.0 6.2* 5.8*  CL 95* 94* 97*  CO2 29 26 20*  BUN 27* 46* 28*  CREATININE 5.35* 7.34* 5.07*  CALCIUM 9.3 8.9 8.8*  PROT 5.4*  --   --   BILITOT 0.9  --   --   ALKPHOS 150*  --   --   ALT 64*  --   --   AST 49*  --   --   GLUCOSE 137* 168* 174*    Imaging/Diagnostic Tests: Dg Chest Port 1 View  Result Date: 06/08/2018 CLINICAL DATA:  Shortness of breath. EXAM: PORTABLE CHEST 1 VIEW COMPARISON:  Radiograph of Jun 06, 2018. FINDINGS: Stable cardiomegaly. Status post coronary artery bypass graft. Atherosclerosis of thoracic aorta is noted. No pneumothorax or pleural effusion is noted. No acute pulmonary disease is noted. Bony thorax is unremarkable. IMPRESSION: No acute cardiopulmonary abnormality seen. Electronically Signed   By: Marijo Conception M.D.   On: 06/08/2018 14:40   Dg C-arm 1-60 Min  Result Date: 06/08/2018 CLINICAL DATA:  Right femur fracture. EXAM: RIGHT FEMUR 2 VIEWS; DG C-ARM 61-120 MIN COMPARISON:  06/06/2018 FINDINGS: Placement of an intramedullary nail in the right femur with screws extending into the right femoral head and neck. Again noted is the femoral intertrochanteric fracture. The distal aspect of the intramedullary nail is visualized with an interlocking screw. Patient has a vascular stent in the mid thigh. Right hip appears to be located based on these images. IMPRESSION: Internal fixation of the right femur fracture. Electronically Signed   By: Markus Daft M.D.   On: 06/08/2018 16:28   Dg Femur, Min 2 Views Right  Result Date: 06/08/2018 CLINICAL DATA:  Right femur fracture. EXAM: RIGHT FEMUR 2 VIEWS; DG C-ARM 61-120 MIN COMPARISON:  06/06/2018 FINDINGS: Placement of an intramedullary nail in the right femur with screws extending into the right femoral head and neck. Again noted is the femoral intertrochanteric fracture. The distal aspect of the intramedullary nail is visualized with an interlocking screw. Patient has a vascular stent in the mid thigh. Right hip  appears to be located based on these images. IMPRESSION: Internal fixation of the right femur fracture. Electronically Signed   By: Markus Daft M.D.   On: 06/08/2018 16:28    Danna Hefty, DO 06/09/2018, 7:33 AM PGY-1, [place] Intern pager: [phone], text pages welcome

## 2018-06-08 NOTE — Transfer of Care (Signed)
Immediate Anesthesia Transfer of Care Note  Patient: Christine Santos  Procedure(s) Performed: INTRAMEDULLARY (IM) NAIL INTERTROCHANTRIC (Right Hip)  Patient Location: PACU  Anesthesia Type:General  Level of Consciousness: awake, alert  and oriented  Airway & Oxygen Therapy: Patient Spontanous Breathing and Patient connected to nasal cannula oxygen  Post-op Assessment: Report given to RN and Post -op Vital signs reviewed and stable  Post vital signs: Reviewed and stable  Last Vitals:  Vitals Value Taken Time  BP 157/101 06/08/2018  4:38 PM  Temp    Pulse 46 06/08/2018  4:48 PM  Resp 19 06/08/2018  4:49 PM  SpO2 86 % 06/08/2018  4:48 PM  Vitals shown include unvalidated device data.  Last Pain:  Vitals:   06/08/18 1300  TempSrc: Oral  PainSc:       Patients Stated Pain Goal: 4 (73/42/87 [id number])  Complications: No apparent anesthesia complications

## 2018-06-08 NOTE — Progress Notes (Signed)
Subjective:  Seen in HD-  Still in pain but manageable- for surgery this afternoon   Objective Vital signs in last 24 hours: Vitals:   06/07/18 1508 06/07/18 1817 06/07/18 2034 06/08/18 0416  BP: (!) 149/52  (!) 148/66 (!) 156/58  Pulse: 88 (!) 109 90 89  Resp:   18 18  Temp: 98.6 F (37 C)  98.6 F (37 C) 98 F (36.7 C)  TempSrc: Oral  Oral Oral  SpO2: 98%  90% 94%  Weight:      Height:       Weight change:   Intake/Output Summary (Last 24 hours) at 06/08/2018 0811 Last data filed at 06/07/2018 1847 Gross per 24 hour  Intake 1080 ml  Output -  Net 1080 ml    Dialysis Orders: [facility]   on MWF . EDW 68  HD Bath 2k,2ca  Time 4 Heparin none. Access LUA AVG BFR 350  DFR 500    Calcitriol 0.6mcg po /HD  Last op  hgb 11.8  No esa    Assessment/ Plan: Pt is a 73 y.o. yo female ESRD who was admitted on 06/06/2018 with a hip fracture  Assessment/Plan: 1. Hip fracture-  Mechanical fall- for operative repair this afternoon  2. ESRD - normally MWF at [facility] via AVG-  Getting HD today on schedule  3. Anemia- has not been an issue of late, no ESA as OP- suspect will go down once has surgery  4. Secondary hyperparathyroidism- cont home calcitriol and auryxia- no recent phos- may go down while here  5. HTN/volume- BP seems fine, toprol only- no change - UF today  6. Afib-  Rate controlled for most part on beta blocker, eliquis on hold for surgery   Louis Meckel    Labs: Basic Metabolic Panel: Recent Labs  Lab 06/06/18 2059 06/07/18 0353  NA 141 140  K 4.3 5.0  CL 96* 95*  CO2 32 29  GLUCOSE 93 137*  BUN 24* 27*  CREATININE 4.84* 5.35*  CALCIUM 9.0 9.3   Liver Function Tests: Recent Labs  Lab 06/07/18 0353  AST 49*  ALT 64*  ALKPHOS 150*  BILITOT 0.9  PROT 5.4*  ALBUMIN 3.1*   No results for input(s): LIPASE, AMYLASE in the last 168 hours. No results for input(s): AMMONIA in the last 168 hours. CBC: Recent Labs  Lab 06/06/18 2059  WBC 12.4*   NEUTROABS 8.4*  HGB 11.9*  HCT 38.4  MCV 99.2  PLT 143*   Cardiac Enzymes: No results for input(s): CKTOTAL, CKMB, CKMBINDEX, TROPONINI in the last 168 hours. CBG: Recent Labs  Lab 06/07/18 1732 06/07/18 2029 06/07/18 2213 06/08/18 0012 06/08/18 0358  GLUCAP 192* 227* 191* 151* 152*    Iron Studies: No results for input(s): IRON, TIBC, TRANSFERRIN, FERRITIN in the last 72 hours. Studies/Results: Dg Chest 1 View  Result Date: 06/06/2018 CLINICAL DATA:  Fall EXAM: CHEST  1 VIEW COMPARISON:  01/10/2018 FINDINGS: Moderate cardiomegaly. Remote median sternotomy and CABG. No focal airspace consolidation or pulmonary edema. No pleural effusion or pneumothorax. IMPRESSION: Cardiomegaly without focal airspace disease. Electronically Signed   By: Ulyses Jarred M.D.   On: 06/06/2018 22:25   Dg Hip Unilat With Pelvis 2-3 Views Right  Result Date: 06/06/2018 CLINICAL DATA:  Fall EXAM: DG HIP (WITH OR WITHOUT PELVIS) 2-3V RIGHT COMPARISON:  None. FINDINGS: Medially angulated fracture at the base of the right femoral neck femoral head remains positioned in the acetabulum. No pelvic fracture. IMPRESSION: Medially angulated  fracture of the base of the right femoral neck. Electronically Signed   By: Ulyses Jarred M.D.   On: 06/06/2018 22:21   Medications: Infusions: . sodium chloride    . clindamycin (CLEOCIN) IV    . tranexamic acid      Scheduled Medications: . acetaminophen  650 mg Oral Q6H  . calcitRIOL  0.25 mcg Oral Q M,W,F-HD  . Chlorhexidine Gluconate Cloth  6 each Topical Daily  . ferric citrate  420 mg Oral TID WC  . insulin aspart  0-9 Units Subcutaneous Q4H  . metoprolol succinate  25 mg Oral Daily  . mupirocin ointment  1 application Nasal BID  . pantoprazole  40 mg Oral QHS  . PARoxetine  40 mg Oral Daily  . povidone-iodine  2 application Topical Once  . rosuvastatin  20 mg Oral Daily  . sodium chloride flush  3 mL Intravenous Q12H  . sodium chloride flush  3 mL  Intravenous Q12H  . tranexamic acid (CYKLOKAPRON) topical -INTRAOP  2,000 mg Topical To OR    have reviewed scheduled and prn medications.  Physical Exam: General: NAD Heart: irreg Lungs: mostly clear Abdomen: soft, non tender  Extremities: really no edema Dialysis Access: left AVG- accessed     06/08/2018,8:11 AM  LOS: 1 day

## 2018-06-08 NOTE — Anesthesia Procedure Notes (Signed)
Procedure Name: Intubation Date/Time: 06/08/2018 3:05 PM Performed by: Eligha Bridegroom, CRNA Pre-anesthesia Checklist: Patient identified, Emergency Drugs available, Suction available, Patient being monitored and Timeout performed Patient Re-evaluated:Patient Re-evaluated prior to induction Oxygen Delivery Method: Circle system utilized Preoxygenation: Pre-oxygenation with 100% oxygen Induction Type: IV induction Ventilation: Mask ventilation without difficulty Laryngoscope Size: Mac and 3 Grade View: Grade I Tube type: Oral Tube size: 7.0 mm Airway Equipment and Method: Stylet Placement Confirmation: ETT inserted through vocal cords under direct vision,  positive ETCO2 and breath sounds checked- equal and bilateral Secured at: 21 cm Tube secured with: Tape Dental Injury: Teeth and Oropharynx as per pre-operative assessment

## 2018-06-08 NOTE — Progress Notes (Signed)
Pt drink 316mlof G2 gatorade for eras pathway.

## 2018-06-08 NOTE — Procedures (Signed)
Patient was seen on dialysis and the procedure was supervised.  BFR 400  Via AVG BP is  156/58.   Patient appears to be tolerating treatment well  Louis Meckel 06/08/2018

## 2018-06-08 NOTE — Progress Notes (Signed)
Pt satting 69% on RA. Denies SOB. Placed on 2LNC O2 sat went up to 94%. Pulling 1053ml on IS. Made MD on call aware. CXR ordered.

## 2018-06-08 NOTE — Progress Notes (Signed)
PT Cancellation Note  Patient Details Name: Christine Santos MRN: [id number] DOB: 06/12/1945   Cancelled Treatment:    Reason Eval/Treat Not Completed: Active bedrest order. Pt remains with strict bed rest orders in place and awaiting surgery. PT will continue to follow acutely as available and appropriate.    [place] 06/08/2018, 8:05 AM

## 2018-06-08 NOTE — Progress Notes (Signed)
Family Medicine Teaching Service Daily Progress Note Intern Pager: [phone]  Patient name: Christine Santos Medical record number: [id number] Date of birth: November 19, 1945 Age: 73 y.o. Gender: female  Primary Care Provider: [name], MD Consultants: IP CONSULT TO ORTHOPEDIC SURGERY MEDICATION RECONCILIATION PENDING PHARMACIST REVIEW Code Status: Full Code   Pt Overview and Major Events to Date:  Hospital Day: 3 06/06/2018: admitted for R femur fracture  06/08/2018 : ORIF Femur, restart eliquis  Assessment and Plan: Christine Santos is a 73 y.o. female who presented after a fall and subsequent displaced intertrochanteric fracture of right femur. PMHx s/f IBS, reflux, diabetic neuropathy, IDDM, paroxysmal A. fib on Eliquis, HTN, systolic heart failure, PVD, sinus bradycardia, CAD, stress incontinence, ESRD on HD MWF, secondary hyperparathyroidism, ACD.  #Acute right femur fracture Patient scheduled for surgery today with orthopedics.  Eliquis washout since admission.  Pain well controlled.  We will not bridge for this surgery and restart Eliquis after surgery.  MRSA negative.  Holding Eliquis  IV Dilaudid 0.5 mg every 4 as needed - Holding home PRN tramadol   Follow-up surgery recommendations for PT, follow-up, weightbearing, pain control, restart eliquis  Continue bedrest    #ESRD with HD MWF #ACD #2' Hyperparathyroidism No missed dialysis.  Patient's dry weight is 68 kg. Pt is compliant with medications.  Hemoglobin 12.2, no ESA outpatient.  Stable.  RFP with abnormal values prior to HD-K6.2, phosphorus 6.7, calcium 8.9 not corrected, albumin 2.9  Appreciate nephrology recommendations   Daily RFP  Continue home calcitriol and Auryxia  Hemodialysis today  Renal diet  Avoid continuous fluids  #A. Fib # Prolonged QTC on EKG A fib on admission. No events on tele overnight.   Continue home metoprolol XL 25 mg daily when PO  Holding Eliquis 5 mg, SCD  Telemetry, daily  EKG  Avoid QT prolonging agents  # IDDM At home, 12 units QD and humalog 5 units TID with meals    Hold insulin while NPO for surgery   CBG q4 hours while NPO   sSSI  Schedule insulin once PO post op    # HTN At home, patient takes hydralazine 50 mg daily and metoprolol 25 mg daily.  Continue home medications postop  # CHF Last echo 2017, EF 55-60%.   Hold meds while NPO    #Depression   Continue home Paxil 40 mg every morning  # HLD Lipid profile wnl.   At home, rosuvastating 20 mg fish oil- hold while NPO   # GERD   Continue home protonix, sub IV PTX   #Gout   Hold allopurinol 50 mg daily while n.p.o.  #Insomnia  Continue home 10 mg melatonin when taking p.o.  # Hx Chornic Lymphocytic Leukemia  PET CT in 2016 negative for disease  #FEN/GI:  . Fluids: use with caution w/ ESRD . Electrolytes: HD MWF . Nutrition: NPO   Access: R PIV, L AVG VTE prophylaxis: SCDs  Disposition: Pending PT/OT recommendations When medically stable  TBD -     Subjective:  NAEO.   Objective: Temp:  [98 F (36.7 C)-98.6 F (37 C)] 98 F (36.7 C) (05/15 0416) Pulse Rate:  [88-109] 89 (05/15 0416) Cardiac Rhythm: Atrial fibrillation;Bundle branch block (05/14 1900) Resp:  [18] 18 (05/15 0416) BP: (148-156)/(52-66) 156/58 (05/15 0416) SpO2:  [90 %-98 %] 94 % (05/15 0416) 05/14 0701 - 05/15 0700 In: 1080 [P.O.:1080] Out: -    Physical Exam: General: NAD, non-toxic, well-appearing, sitting comfortably in bed in HD,  tolerating well.    Cardiovascular: RRR, normal S1, S2. 2+ RP bilaterally. No BLEE Respiratory: CTAB. No IWOB.  Abdomen: + BS. NT, ND, soft to palpation.  Extremities: Warm and well perfused. Moving spontaneously. Unable to move right leg due to pain. 2+ DPs Neuro: Alert & oriented x 4. CN grossly intact.   Laboratory: I have personally read and reviewed all labs and imaging studies.  CBC: Recent Labs  Lab 06/06/18 2059  WBC 12.4*  NEUTROABS 8.4*   HGB 11.9*  HCT 38.4  MCV 99.2  PLT 143*   CMP: Recent Labs  Lab 06/06/18 2059 06/07/18 0353  NA 141 140  K 4.3 5.0  CL 96* 95*  CO2 32 29  GLUCOSE 93 137*  BUN 24* 27*  CREATININE 4.84* 5.35*  CALCIUM 9.0 9.3  ALBUMIN  --  3.1*     CBG: Recent Labs  Lab 06/07/18 1732 06/07/18 2029 06/07/18 2213 06/08/18 0012 06/08/18 0358  GLUCAP 192* 227* 191* 151* 152*   COVID negative MRSA screen negative  Imaging/Diagnostic Tests: Dg Chest 1 View Cardiomegaly without focal airspace disease.   Dg Hip Unilat With Pelvis 2-3 Views Right Medially angulated fracture of the base of the right femoral neck.    Wilber Oliphant, MD 06/08/2018, 6:30 AM PGY-1, [place] Intern pager: [phone], text pages welcome

## 2018-06-08 NOTE — Op Note (Signed)
° °  Date of Surgery: 06/08/2018  INDICATIONS: Ms. Beaulieu is a 73 y.o.-year-old female who sustained a right hip fracture. The risks and benefits of the procedure discussed with the patient prior to the procedure and all questions were answered; consent was obtained.  PREOPERATIVE DIAGNOSIS: right hip fracture   POSTOPERATIVE DIAGNOSIS: Same   PROCEDURE: Treatment of intertrochanteric fracture with intramedullary implant. CPT [phone]   SURGEON: N. Eduard Roux, M.D.   ASSIST: Ciro Backer [place], [place]; necessary for the timely completion of procedure and due to complexity of procedure.  ANESTHESIA: general   IV FLUIDS AND URINE: See anesthesia record   ESTIMATED BLOOD LOSS: 200   IMPLANTS: [employer] InterTAN 10 x 38, 85/80  DRAINS: None.   COMPLICATIONS: None.   DESCRIPTION OF PROCEDURE: The patient was brought to the operating room and placed supine on the operating table. The patient's leg had been signed prior to the procedure. The patient had the anesthesia placed by the anesthesiologist. The prep verification and incision time-outs were performed to confirm that this was the correct patient, site, side and location. The patient had an SCD on the opposite lower extremity. The patient did receive antibiotics prior to the incision and was re-dosed during the procedure as needed at indicated intervals. The patient was positioned on the fracture table with the table in traction and internal rotation to reduce the hip. The well leg was placed in a scissor position and all bony prominences were well-padded. The patient had the lower extremity prepped and draped in the standard surgical fashion. The incision was made 4 finger breadths superior to the greater trochanter. A guide pin was inserted into the tip of the greater trochanter under fluoroscopic guidance. An opening reamer was used to gain access to the femoral canal. The nail length was measured and inserted down the femoral canal to  its proper depth. The appropriate version of insertion for the lag screw was found under fluoroscopy. A pin was inserted up the femoral neck through the jig. Then, a second antirotation pin was inserted inferior to the first pin. The length of the lag screw was then measured. The lag screw was inserted as near to center-center in the head as possible. The antirotation pin was then taken out and an interdigitating compression screw was placed in its place. The leg was taken out of traction, then the interdigitating compression screw was used to compress across the fracture. Compression was visualized on serial xrays. A single distal interlocking screw was then placed using the perfect circle technique.  The wound was copiously irrigated with saline and the subcutaneous layer closed with 2.0 vicryl and the skin was reapproximated with staples. The wounds were cleaned and dried a final time and a sterile dressing was placed. The hip was taken through a range of motion at the end of the case under fluoroscopic imaging to visualize the approach-withdraw phenomenon and confirm implant length in the head. The patient was then awakened from anesthesia and taken to the recovery room in stable condition. All counts were correct at the end of the case.   POSTOPERATIVE PLAN: The patient will be weight bearing as tolerated and will return in 2 weeks for staple removal and the patient will receive DVT prophylaxis based on other medications, activity level, and risk ratio of bleeding to thrombosis.   Azucena Cecil, MD [place] 4:00 PM

## 2018-06-08 NOTE — Progress Notes (Signed)
Received patient from PACU post hip surgery. Patient was alert and oriented, not in any distress, VSS. Will continue to monitor.

## 2018-06-08 NOTE — Anesthesia Preprocedure Evaluation (Addendum)
Anesthesia Evaluation  Patient identified by MRN, date of birth, ID band Patient awake    Reviewed: Allergy & Precautions, NPO status , Patient's Chart, lab work & pertinent test results, reviewed documented beta blocker date and time   History of Anesthesia Complications Negative for: history of anesthetic complications  Airway Mallampati: II  TM Distance: >3 FB Neck ROM: Full    Dental  (+) Dental Advisory Given, Edentulous Upper, Missing,    Pulmonary neg pulmonary ROS,    Pulmonary exam normal breath sounds clear to auscultation       Cardiovascular hypertension, Pt. on home beta blockers and Pt. on medications + CAD, + Past MI, + CABG (1999) and + Peripheral Vascular Disease  Normal cardiovascular exam+ dysrhythmias (on Eliquis, last dose 06/06/18) Atrial Fibrillation  Rhythm:Regular Rate:Normal  TTE 2017: mild LVH, EF 62-56%, grade 1 diastolic dysfunction  EKG 06/06/18: Afib, incomplete RBBB and LAFB, prolonged QTc 507      Neuro/Psych Anxiety CVA, No Residual Symptoms    GI/Hepatic Neg liver ROS, hiatal hernia, GERD  Medicated and Controlled,  Endo/Other  diabetes, Type 2, Insulin Dependent  Renal/GU ESRF and DialysisRenal disease (last HD this morning)     Musculoskeletal  (+) Arthritis , Right hip fracture   Abdominal   Peds  Hematology  (+) anemia , CLL   Anesthesia Other Findings Day of surgery medications reviewed with the patient.  Reproductive/Obstetrics                            Anesthesia Physical Anesthesia Plan  ASA: IV  Anesthesia Plan: General   Post-op Pain Management:    Induction: Intravenous  PONV Risk Score and Plan: 4 or greater and Treatment may vary due to age or medical condition, Dexamethasone and Midazolam  Airway Management Planned: Oral ETT  Additional Equipment:   Intra-op Plan:   Post-operative Plan: Extubation in OR  Informed Consent: I  have reviewed the patients History and Physical, chart, labs and discussed the procedure including the risks, benefits and alternatives for the proposed anesthesia with the patient or authorized representative who has indicated his/her understanding and acceptance.     Dental advisory given  Plan Discussed with: CRNA  Anesthesia Plan Comments:       Anesthesia Quick Evaluation

## 2018-06-09 ENCOUNTER — Inpatient Hospital Stay (HOSPITAL_COMMUNITY): Payer: Medicare Other

## 2018-06-09 ENCOUNTER — Encounter (HOSPITAL_COMMUNITY): Payer: Self-pay | Admitting: Radiology

## 2018-06-09 DIAGNOSIS — R0989 Other specified symptoms and signs involving the circulatory and respiratory systems: Secondary | ICD-10-CM

## 2018-06-09 DIAGNOSIS — I739 Peripheral vascular disease, unspecified: Secondary | ICD-10-CM

## 2018-06-09 LAB — BASIC METABOLIC PANEL
Anion gap: 20 — ABNORMAL HIGH (ref 5–15)
BUN: 28 mg/dL — ABNORMAL HIGH (ref 8–23)
CO2: 20 mmol/L — ABNORMAL LOW (ref 22–32)
Calcium: 8.8 mg/dL — ABNORMAL LOW (ref 8.9–10.3)
Chloride: 97 mmol/L — ABNORMAL LOW (ref 98–111)
Creatinine, Ser: 5.07 mg/dL — ABNORMAL HIGH (ref 0.44–1.00)
GFR calc Af Amer: 9 mL/min — ABNORMAL LOW (ref >60)
GFR calc non Af Amer: 8 mL/min — ABNORMAL LOW (ref >60)
Glucose, Bld: 174 mg/dL — ABNORMAL HIGH (ref 70–99)
Potassium: 5.8 mmol/L — ABNORMAL HIGH (ref 3.5–5.1)
Sodium: 137 mmol/L (ref 135–145)

## 2018-06-09 LAB — POTASSIUM: Potassium: 5.5 mmol/L — ABNORMAL HIGH (ref 3.5–5.1)

## 2018-06-09 LAB — CBC
HCT: 38.6 % (ref 36.0–46.0)
Hemoglobin: 11.8 g/dL — ABNORMAL LOW (ref 12.0–15.0)
MCH: 30.8 pg (ref 26.0–34.0)
MCHC: 30.6 g/dL (ref 30.0–36.0)
MCV: 100.8 fL — ABNORMAL HIGH (ref 80.0–100.0)
Platelets: 133 10*3/uL — ABNORMAL LOW (ref 150–400)
RBC: 3.83 MIL/uL — ABNORMAL LOW (ref 3.87–5.11)
RDW: 19.9 % — ABNORMAL HIGH (ref 11.5–15.5)
WBC: 17.6 10*3/uL — ABNORMAL HIGH (ref 4.0–10.5)
nRBC: 0.1 % (ref 0.0–0.2)

## 2018-06-09 LAB — GLUCOSE, CAPILLARY
Glucose-Capillary: 168 mg/dL — ABNORMAL HIGH (ref 70–99)
Glucose-Capillary: 171 mg/dL — ABNORMAL HIGH (ref 70–99)
Glucose-Capillary: 177 mg/dL — ABNORMAL HIGH (ref 70–99)
Glucose-Capillary: 177 mg/dL — ABNORMAL HIGH (ref 70–99)
Glucose-Capillary: 184 mg/dL — ABNORMAL HIGH (ref 70–99)
Glucose-Capillary: 199 mg/dL — ABNORMAL HIGH (ref 70–99)
Glucose-Capillary: 289 mg/dL — ABNORMAL HIGH (ref 70–99)

## 2018-06-09 LAB — APTT: aPTT: 29 seconds (ref 24–36)

## 2018-06-09 LAB — PROTIME-INR
INR: 1.2 (ref 0.8–1.2)
Prothrombin Time: 15.2 seconds (ref 11.4–15.2)

## 2018-06-09 LAB — HEPARIN LEVEL (UNFRACTIONATED)
Heparin Unfractionated: 0.34 IU/mL (ref 0.30–0.70)
Heparin Unfractionated: 0.88 IU/mL — ABNORMAL HIGH (ref 0.30–0.70)

## 2018-06-09 LAB — HEPATITIS B SURFACE ANTIGEN: Hepatitis B Surface Ag: NEGATIVE

## 2018-06-09 MED ORDER — ONDANSETRON HCL 4 MG/2ML IJ SOLN
4.0000 mg | Freq: Four times a day (QID) | INTRAMUSCULAR | Status: DC | PRN
  Administered 2018-06-10: 4 mg via INTRAVENOUS
  Filled 2018-06-09: qty 2

## 2018-06-09 MED ORDER — METHOCARBAMOL 500 MG PO TABS
500.0000 mg | ORAL_TABLET | Freq: Four times a day (QID) | ORAL | Status: DC | PRN
  Administered 2018-06-13 – 2018-06-14 (×2): 500 mg via ORAL
  Filled 2018-06-09 (×2): qty 1

## 2018-06-09 MED ORDER — PATIROMER SORBITEX CALCIUM 8.4 G PO PACK
16.8000 g | PACK | Freq: Once | ORAL | Status: AC
  Administered 2018-06-09: 16.8 g via ORAL
  Filled 2018-06-09: qty 2

## 2018-06-09 MED ORDER — HYDROCODONE-ACETAMINOPHEN 5-325 MG PO TABS: 1.0000 | ORAL_TABLET | ORAL | Status: DC | PRN

## 2018-06-09 MED ORDER — ACETAMINOPHEN 500 MG PO TABS
500.0000 mg | ORAL_TABLET | Freq: Four times a day (QID) | ORAL | Status: AC
  Administered 2018-06-09 (×3): 500 mg via ORAL
  Filled 2018-06-09 (×3): qty 1

## 2018-06-09 MED ORDER — SODIUM ZIRCONIUM CYCLOSILICATE 5 G PO PACK
5.0000 g | PACK | Freq: Once | ORAL | Status: AC
  Administered 2018-06-09: 5 g via ORAL
  Filled 2018-06-09 (×2): qty 1

## 2018-06-09 MED ORDER — HEPARIN (PORCINE) 25000 UT/250ML-% IV SOLN
1000.0000 [IU]/h | INTRAVENOUS | Status: DC
  Administered 2018-06-09: 1000 [IU]/h via INTRAVENOUS
  Filled 2018-06-09: qty 250

## 2018-06-09 MED ORDER — HEPARIN (PORCINE) 25000 UT/250ML-% IV SOLN
750.0000 [IU]/h | INTRAVENOUS | Status: DC
  Filled 2018-06-09: qty 250

## 2018-06-09 MED ORDER — ONDANSETRON HCL 4 MG PO TABS: 4.0000 mg | ORAL_TABLET | Freq: Four times a day (QID) | ORAL | Status: DC | PRN

## 2018-06-09 MED ORDER — IOHEXOL 350 MG/ML SOLN
80.0000 mL | Freq: Once | INTRAVENOUS | Status: AC | PRN
  Administered 2018-06-09: 80 mL via INTRAVENOUS

## 2018-06-09 MED ORDER — DOCUSATE SODIUM 100 MG PO CAPS
100.0000 mg | ORAL_CAPSULE | Freq: Two times a day (BID) | ORAL | Status: DC
  Administered 2018-06-09 – 2018-06-14 (×9): 100 mg via ORAL
  Filled 2018-06-09 (×9): qty 1

## 2018-06-09 MED ORDER — HYDROCODONE-ACETAMINOPHEN 7.5-325 MG PO TABS: 1.0000 | ORAL_TABLET | ORAL | Status: DC | PRN

## 2018-06-09 MED ORDER — INSULIN GLARGINE 100 UNIT/ML ~~LOC~~ SOLN
6.0000 [IU] | Freq: Every day | SUBCUTANEOUS | Status: DC
  Administered 2018-06-09 – 2018-06-10 (×2): 6 [IU] via SUBCUTANEOUS
  Filled 2018-06-09 (×4): qty 0.06

## 2018-06-09 MED ORDER — ALLOPURINOL 100 MG PO TABS
50.0000 mg | ORAL_TABLET | Freq: Every day | ORAL | Status: DC
  Administered 2018-06-09 – 2018-06-14 (×5): 50 mg via ORAL
  Filled 2018-06-09 (×5): qty 1

## 2018-06-09 MED ORDER — SODIUM CHLORIDE 0.9 % IV SOLN: INTRAVENOUS | Status: DC

## 2018-06-09 MED ORDER — METHOCARBAMOL 1000 MG/10ML IJ SOLN
500.0000 mg | Freq: Four times a day (QID) | INTRAVENOUS | Status: DC | PRN
  Filled 2018-06-09: qty 5

## 2018-06-09 MED ORDER — APIXABAN 5 MG PO TABS: 5.0000 mg | ORAL_TABLET | Freq: Two times a day (BID) | ORAL | Status: DC

## 2018-06-09 MED ORDER — MENTHOL 3 MG MT LOZG
1.0000 | LOZENGE | OROMUCOSAL | Status: DC | PRN
  Filled 2018-06-09: qty 9

## 2018-06-09 MED ORDER — CLINDAMYCIN PHOSPHATE 600 MG/50ML IV SOLN
600.0000 mg | Freq: Four times a day (QID) | INTRAVENOUS | Status: AC
  Administered 2018-06-09 (×2): 600 mg via INTRAVENOUS
  Filled 2018-06-09 (×2): qty 50

## 2018-06-09 MED ORDER — MORPHINE SULFATE (PF) 2 MG/ML IV SOLN
1.0000 mg | INTRAVENOUS | Status: DC | PRN
  Filled 2018-06-09: qty 1

## 2018-06-09 MED ORDER — MAGNESIUM CITRATE PO SOLN: 1.0000 | Freq: Once | ORAL | Status: DC | PRN

## 2018-06-09 MED ORDER — POLYETHYLENE GLYCOL 3350 17 G PO PACK: 17.0000 g | PACK | Freq: Every day | ORAL | Status: DC | PRN

## 2018-06-09 MED ORDER — PHENOL 1.4 % MT LIQD: 1.0000 | OROMUCOSAL | Status: DC | PRN

## 2018-06-09 MED ORDER — ACETAMINOPHEN 325 MG PO TABS
325.0000 mg | ORAL_TABLET | Freq: Four times a day (QID) | ORAL | Status: DC | PRN
  Administered 2018-06-10: 650 mg via ORAL
  Filled 2018-06-09: qty 2

## 2018-06-09 MED ORDER — ALUM & MAG HYDROXIDE-SIMETH 200-200-20 MG/5ML PO SUSP: 30.0000 mL | ORAL | Status: DC | PRN

## 2018-06-09 MED ORDER — SORBITOL 70 % SOLN
30.0000 mL | Freq: Every day | Status: DC | PRN
  Administered 2018-06-11: 30 mL via ORAL
  Filled 2018-06-09 (×2): qty 30

## 2018-06-09 NOTE — Progress Notes (Signed)
OT Cancellation Note  Patient Details Name: Christine Santos MRN: [id number] DOB: 01-15-1946   Cancelled Treatment:    Reason Eval/Treat Not Completed: Other (comment), RN requesting therapy to hold today due to SOB (Spo2 >92% on RA), increased HR (119 supine) and concerns regarding L UE.  Will follow.   Delight Stare, OT Acute Rehabilitation Services Pager [phone] Office [phone]   Delight Stare 06/09/2018, 10:18 AM

## 2018-06-09 NOTE — Progress Notes (Signed)
Left upper extremity arterial duplex completed. Preliminary- arterial flow is patent through to left hand, however there is significant reduction in flow distal to AVG anastomosis.  06/09/2018 10:06 AM Maudry Mayhew, MHA, RVT, RDCS, RDMS

## 2018-06-09 NOTE — Progress Notes (Signed)
Doppler done to left arm, tech stated she was txting the MD, Dr. Maudie Mercury in for visit and relayed the result of the Doppler.

## 2018-06-09 NOTE — Progress Notes (Signed)
Called to see pt regarding left hand coolness Duplex reviewed has flow to hand probably has an element of steal Pt currently in CT will come by her room again tomorrow to eval  Ruta Hinds, MD [facility] Office: [phone] Pager: [phone]

## 2018-06-09 NOTE — Progress Notes (Addendum)
[facility] Progress Note   Subjective:  S/p surgical repair of right hip fracture yesterday afternoon Completed dialysis yesterday. Removed 1500 Not feeling well this am. Nauseated, dizzy, tachycardic. Primary aware. Getting stat CXR/EKG.  Looked great when seen yesterday- cant really describe how she feels and cannot tell me when it started - towel on head   Objective Vitals:   06/08/18 2224 06/09/18 0038 06/09/18 0424 06/09/18 0835  BP: (!) 130/50 (!) 98/51 (!) 94/55 110/87  Pulse: 100 93 66 72  Resp: 16 14 14    Temp: 98.4 F (36.9 C) 98.3 F (36.8 C) 98.2 F (36.8 C) (!) 97.5 F (36.4 C)  TempSrc: Oral Oral Oral Oral  SpO2: 100% 92% 94% 100%  Weight:      Height:        Physical Exam General: Ill appearing elderly female on nasal oxygen  Heart: Tachy, irregular  Lungs: CTA anteriorly  Abdomen: soft NT Extremities: No sig edema; L foot cool to touch  Dialysis Access: LUE AVF +bruit    Weight change:    Additional Objective Labs: Basic Metabolic Panel: Recent Labs  Lab 06/07/18 0353 06/08/18 0802 06/09/18 0430  NA 140 136 137  K 5.0 6.2* 5.8*  CL 95* 94* 97*  CO2 29 26 20*  GLUCOSE 137* 168* 174*  BUN 27* 46* 28*  CREATININE 5.35* 7.34* 5.07*  CALCIUM 9.3 8.9 8.8*  PHOS  --  6.7*  --    CBC: Recent Labs  Lab 06/06/18 2059 06/08/18 0801 06/09/18 0430  WBC 12.4* 14.9* 17.6*  NEUTROABS 8.4*  --   --   HGB 11.9* 12.2 11.8*  HCT 38.4 38.7 38.6  MCV 99.2 98.2 100.8*  PLT 143* 151 133*   Blood Culture    Component Value Date/Time   SDES BLOOD RIGHT ANTECUBITAL 10/29/2017 1125   SDES BLOOD RIGHT ANTECUBITAL 10/29/2017 1125   SPECREQUEST  10/29/2017 1125    BOTTLES DRAWN AEROBIC AND ANAEROBIC Blood Culture adequate volume   SPECREQUEST  10/29/2017 1125    BOTTLES DRAWN AEROBIC AND ANAEROBIC Blood Culture adequate volume   CULT  10/29/2017 1125    NO GROWTH 5 DAYS Performed at [facility], [place] [address] [place],  [place] [address]    CULT  10/29/2017 1125    NO GROWTH 5 DAYS Performed at [facility], [place] [address] [place], [place] [address]    REPTSTATUS 11/03/2017 FINAL 10/29/2017 1125   REPTSTATUS 11/03/2017 FINAL 10/29/2017 1125     Medications: . sodium chloride    . sodium chloride    . heparin 1,000 Units/hr (06/09/18 0921)  . methocarbamol (ROBAXIN) IV     . acetaminophen  500 mg Oral Q6H  . allopurinol  50 mg Oral Daily  . calcitRIOL  0.25 mcg Oral Q M,W,F-HD  . Chlorhexidine Gluconate Cloth  6 each Topical Daily  . docusate sodium  100 mg Oral BID  . ferric citrate  420 mg Oral TID WC  . insulin aspart  0-9 Units Subcutaneous Q4H  . insulin glargine  6 Units Subcutaneous Daily  . metoprolol succinate  25 mg Oral Daily  . mupirocin ointment  1 application Nasal BID  . pantoprazole  40 mg Oral QHS  . PARoxetine  40 mg Oral Daily  . rosuvastatin  20 mg Oral Daily  . sodium chloride flush  3 mL Intravenous Q12H  . sodium chloride flush  3 mL Intravenous Q12H    [facility]  [facility]. EDW[facility]HD Bath 2k,2caTime 4Heparin none. AccessLUA AVGBFR 350DFR 500  Calcitriol 0.65mcg po/HD Last op hgb 11.8 No esa   Assessment/Plan: Pt is a 73 y.o. yo female ESRD who was admitted on 06/06/2018 with right hip fracture  1. Hip fracture-  Mechanical fall- s/p intermedullary nail 5/15. Per ortho.  2. ESRD - normally MWF at [facility] via AVG-  K+5.8 post HD? Will order Velt[place] and repeat labs.  Next HD planned for Monday  3. Anemia- has not been an issue of late, no ESA as OP- suspect will go down once has surgery. Hgb 11.8. Follow  4. Secondary hyperparathyroidism- cont home calcitriol and auryxia- phos 6.7 5. HTN/volume- BP seems fine, toprol only- no change. Net UF 1.5L post weight 69.1kg on 5/15. Not quite to EDW by bedweights.  6. Afib-  Symptomatic with rapid rate this am. Has been controlled for most part on beta blocker, eliquis on hold for surgery. Per primary     Lynnda Child PA-C [facility] Pager [phone] 06/09/2018,9:30 AM  LOS: 2 days   Patient seen and examined, agree with above note with above modifications. Pt was looking great yesterday, right now with towel on head- feeling badly - HR kind of up-  Being worked up.  Had full HD yesterday, then surgery.  K this AM 5.8 ?  Agree with veltassa and will check potassium later in the day.  Next HD not planned til Monday but may need sooner if K doesn't come down with medical therapy  Corliss Parish, MD 06/09/2018

## 2018-06-09 NOTE — Progress Notes (Signed)
Subjective: 1 Day Post-Op Procedure(s) (LRB): INTRAMEDULLARY (IM) NAIL INTERTROCHANTRIC (Right) Patient reports pain as moderate.  Nausea this morning. Denies chest pain or SOB.   Objective: Vital signs in last 24 hours: Temp:  [97.2 F (36.2 C)-99.3 F (37.4 C)] 98.2 F (36.8 C) (05/16 0424) Pulse Rate:  [66-116] 66 (05/16 0424) Resp:  [14-20] 14 (05/16 0424) BP: (94-157)/(49-110) 94/55 (05/16 0424) SpO2:  [92 %-100 %] 94 % (05/16 0424) Weight:  [69.1 kg] 69.1 kg (05/15 1342)  Intake/Output from previous day: 05/15 0701 - 05/16 0700 In: 1005 [P.O.:340; I.V.:365; IV Piggyback:300] Out: 1600 [Blood:100] Intake/Output this shift: No intake/output data recorded.  Recent Labs    06/06/18 2059 06/08/18 0801 06/09/18 0430  HGB 11.9* 12.2 11.8*   Recent Labs    06/08/18 0801 06/09/18 0430  WBC 14.9* 17.6*  RBC 3.94 3.83*  HCT 38.7 38.6  PLT 151 133*   Recent Labs    06/08/18 0802 06/09/18 0430  NA 136 137  K 6.2* 5.8*  CL 94* 97*  CO2 26 20*  BUN 46* 28*  CREATININE 7.34* 5.07*  GLUCOSE 168* 174*  CALCIUM 8.9 8.8*   Recent Labs    06/07/18 0353 06/09/18 0619  INR 1.1 1.2    Right lower extremity: Intact pulses distally Incision: dressing C/D/I Compartment soft   Assessment/Plan: 1 Day Post-Op Procedure(s) (LRB): INTRAMEDULLARY (IM) NAIL INTERTROCHANTRIC (Right) Weight bearing as tolerated right lowe extremity  Hgb stable       Christine Santos 06/09/2018, 8:04 AM

## 2018-06-09 NOTE — Plan of Care (Signed)

## 2018-06-09 NOTE — Progress Notes (Signed)
PT Cancellation Note  Patient Details Name: Christine Santos MRN: [id number] DOB: 08-12-1945   Cancelled Treatment:    Reason Eval/Treat Not Completed: Other (comment). RN requesting hold PT due to concerns with pt being dizzy, SOB (SPO2 >93% throughout on RA), increased HR (119 bpm in supine) and concerns regarding L UE. PT will continue to follow acutely as available.    [place] 06/09/2018, 10:29 AM

## 2018-06-09 NOTE — Progress Notes (Addendum)
FPTS Interim Progress Note  S:Went to see patient after getting a page of patient appearing more pale, with some improvement when placed back no oxygen. Patient had removed oxygen at some point this morning. When I saw patient she appear\ed more pale. Notes she feels nauseous and dizzy. Denies any chest pain, SOB, or pain.   O: BP 110/87 (BP Location: Left Arm)   Pulse 72   Temp (!) 97.5 F (36.4 C) (Oral)   Resp 14   Ht 5\' 5"  (1.651 m)   Wt 69.1 kg   SpO2 100%   BMI 25.35 kg/m   Gen: Pale and unwell appearing CV: irregularly irregular and fast HR Lungs: breath sound throughout, normal WOB on 2L o2 Skin: surgical site still well appearing without signs of infection, LUE pale and cold to touch Neuro: A&OX3  A/P: Acute Decompensation: Afebrile. CBG 298. BP: 119/105. HR 120-130's in afib. Skin exam unremarkable.  -stat EKG -stat CXR - give scheduled Metoprolol now -O2 Prn - CTA if work up unremarkable - monitor vitals  LUE: - follow up vascular dublex  -VVS consulted  Danna Hefty, DO 06/09/2018, 9:11 AM PGY-1, [facility] Service pager [phone]

## 2018-06-09 NOTE — Progress Notes (Addendum)
[facility] for Heparin Indication: atrial fibrillation, PTA Apixaban held for surgery  Allergies  Allergen Reactions  . Cefepime Anaphylaxis    Throat swelling  . Amoxicillin Rash    Did it involve swelling of the face/tongue/throat, SOB, or low BP? No Did it involve sudden or severe rash/hives, skin peeling, or any reaction on the inside of your mouth or nose? No Did you need to seek medical attention at a hospital or doctor's office? No When did it last happen?10+ years If all above answers are "NO", may proceed with cephalosporin use.    . Codeine Phosphate Rash  . Penicillins Rash    Did it involve swelling of the face/tongue/throat, SOB, or low BP? No Did it involve sudden or severe rash/hives, skin peeling, or any reaction on the inside of your mouth or nose? No Did you need to seek medical attention at a hospital or doctor's office? No When did it last happen?10+ years If all above answers are "NO", may proceed with cephalosporin use.     Marland Kitchen Propoxyphene Rash   Patient Measurements: Height: 5\' 5"  (165.1 cm) Weight: 152 lb 5.4 oz (69.1 kg) IBW/kg (Calculated) : 57 Heparin Dosing Weight: 69.1 kg  Vital Signs: Temp: 97.5 F (36.4 C) (05/16 0835) Temp Source: Oral (05/16 0835) BP: 110/87 (05/16 0835) Pulse Rate: 72 (05/16 0835)  Labs: Recent Labs    06/06/18 2059 06/07/18 0353 06/08/18 0801 06/08/18 0802 06/09/18 0430 06/09/18 0619  HGB 11.9*  --  12.2  --  11.8*  --   HCT 38.4  --  38.7  --  38.6  --   PLT 143*  --  151  --  133*  --   LABPROT 14.1 13.7  --   --   --  15.2  INR 1.1 1.1  --   --   --  1.2  CREATININE 4.84* 5.35*  --  7.34* 5.07*  --     Estimated Creatinine Clearance: 9.6 mL/min (A) (by C-G formula based on SCr of 5.07 mg/dL (H)).   Medical History: Past Medical History:  Diagnosis Date  . Anemia 08/29/2011  . Anxiety   . Arthritis    "legs" (01/22/2013)  . CAD (coronary artery  disease)    s/p CABG in 1999  . Cataract   . Chronic kidney disease    Sees Dr [name]  . Chronic lymphocytic leukemia ([place]) 10/13/2006   Qualifier: Diagnosis of  By: Andria Frames MD, Gwyndolyn Saxon    . 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 Diastolic heart failure ([place])   . Dysrhythmia    PAF  . Enteritis due to Clostridium difficile 07/02/2017  . GERD (gastroesophageal reflux disease)   . Gout   . H/O hiatal hernia   . Headache    "years ago migraines, none in along time."  . Heart murmur   . Hyperlipidemia   . Hypertension   . 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 Myocardial infarction ([place])    before 1995  . PAD (peripheral artery disease) ([place])   . Paroxysmal atrial fibrillation (H[facility])    a. identified on ILR as part of StrokeAF study  . Peripheral vascular disease ([place])   . Pneumonia 11/2016  . Stress incontinence, female 11/11/2010  . 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 Still has balance issues 03/05/2018- vision improved, balance improved , back  on walker since hospitaization,    Medications:  Scheduled:  . acetaminophen  500 mg Oral Q6H  . allopurinol  50 mg Oral Daily  . calcitRIOL  0.25 mcg Oral Q M,W,F-HD  . Chlorhexidine Gluconate Cloth  6 each Topical Daily  . docusate sodium  100 mg Oral BID  . ferric citrate  420 mg Oral TID WC  . insulin aspart  0-9 Units Subcutaneous Q4H  . insulin glargine  6 Units Subcutaneous Daily  . metoprolol succinate  25 mg Oral Daily  . mupirocin ointment  1 application Nasal BID  . pantoprazole  40 mg Oral QHS  . PARoxetine  40 mg Oral Daily  . rosuvastatin  20 mg Oral Daily  . sodium chloride flush  3 mL Intravenous Q12H  . sodium chloride flush  3 mL Intravenous Q12H   Infusions:  . sodium chloride    . sodium chloride    . heparin    . methocarbamol (ROBAXIN) IV     Assessment: 24 yoF with ESRD on HD and afib on apixaban presents  after a fall and hip fx.  5/15 IM nail hip Fx. Home Apixaban 5mg  bid, last dose 5/13 at 0500  - Apixaban artificially elevates Heparin levels, will order baseline aPTT and Heparin level.  Anticipate Heparin level will be low with last dose Apixaban 3 days ago  Goal of Therapy:  Heparin level 0.3-0.7 units/ml aPTT 66-102 seconds Monitor platelets by anticoagulation protocol: Yes   Plan:  Baseline aPTT & Hep level, draw before Heparin infusion begins Begin Heparin infusion at 1000 units/hr, no load Do not wait for levels to start Heparin Plan Heparin level in 8 hr after start (1800)  [name]  [phone] till 3:30p 06/09/2018,8:37 AM

## 2018-06-09 NOTE — Progress Notes (Signed)
[facility] for Heparin Indication: atrial fibrillation, PTA Apixaban held for surgery  Allergies  Allergen Reactions  . Cefepime Anaphylaxis    Throat swelling  . Amoxicillin Rash    Did it involve swelling of the face/tongue/throat, SOB, or low BP? No Did it involve sudden or severe rash/hives, skin peeling, or any reaction on the inside of your mouth or nose? No Did you need to seek medical attention at a hospital or doctor's office? No When did it last happen?10+ years If all above answers are "NO", may proceed with cephalosporin use.    . Codeine Phosphate Rash  . Penicillins Rash    Did it involve swelling of the face/tongue/throat, SOB, or low BP? No Did it involve sudden or severe rash/hives, skin peeling, or any reaction on the inside of your mouth or nose? No Did you need to seek medical attention at a hospital or doctor's office? No When did it last happen?10+ years If all above answers are "NO", may proceed with cephalosporin use.     Marland Kitchen Propoxyphene Rash   Patient Measurements: Height: 5\' 5"  (165.1 cm) Weight: 152 lb 5.4 oz (69.1 kg) IBW/kg (Calculated) : 57 Heparin Dosing Weight: 69.1 kg  Vital Signs: Temp: 98.6 F (37 C) (05/16 1900) Temp Source: Oral (05/16 1900) BP: 111/82 (05/16 1900) Pulse Rate: 95 (05/16 1900)  Labs: Recent Labs    06/06/18 2059 06/07/18 0353 06/08/18 0801 06/08/18 0802 06/09/18 0430 06/09/18 0619 06/09/18 0915 06/09/18 1837  HGB 11.9*  --  12.2  --  11.8*  --   --   --   HCT 38.4  --  38.7  --  38.6  --   --   --   PLT 143*  --  151  --  133*  --   --   --   APTT  --   --   --   --   --   --  29  --   LABPROT 14.1 13.7  --   --   --  15.2  --   --   INR 1.1 1.1  --   --   --  1.2  --   --   HEPARINUNFRC  --   --   --   --   --   --  0.34 0.88*  CREATININE 4.84* 5.35*  --  7.34* 5.07*  --   --   --    Estimated Creatinine Clearance: 9.6 mL/min (A) (by C-G formula based on  SCr of 5.07 mg/dL (H)).  Medical History: Past Medical History:  Diagnosis Date  . Anemia 08/29/2011  . Anxiety   . Arthritis    "legs" (01/22/2013)  . CAD (coronary artery disease)    s/p CABG in 1999  . Cataract   . Chronic kidney disease    Sees Dr [name]  . Chronic lymphocytic leukemia ([place]) 10/13/2006   Qualifier: Diagnosis of  By: Andria Frames MD, Gwyndolyn Saxon    . 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 Diastolic heart failure ([place])   . Dysrhythmia    PAF  . Enteritis due to Clostridium difficile 07/02/2017  . GERD (gastroesophageal reflux disease)   . Gout   . H/O hiatal hernia   . Headache    "years ago migraines, none in along time."  . Heart murmur   . Hyperlipidemia   . Hypertension   . 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 Myocardial infarction ([place])    before 1995  . PAD (peripheral artery disease) ([place])   . Paroxysmal atrial fibrillation (H[facility])    a. identified on ILR as part of StrokeAF study  . Peripheral vascular disease ([place])   . Pneumonia 11/2016  . Stress incontinence, female 11/11/2010  . 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 Still has balance issues 03/05/2018- vision improved, balance improved , back on walker since hospitaization,    Medications:  Scheduled:  . allopurinol  50 mg Oral Daily  . [START ON 06/10/2018] apixaban  5 mg Oral BID  . calcitRIOL  0.25 mcg Oral Q M,W,F-HD  . Chlorhexidine Gluconate Cloth  6 each Topical Daily  . docusate sodium  100 mg Oral BID  . ferric citrate  420 mg Oral TID WC  . insulin aspart  0-9 Units Subcutaneous Q4H  . insulin glargine  6 Units Subcutaneous Daily  . metoprolol succinate  25 mg Oral Daily  . mupirocin ointment  1 application Nasal BID  . pantoprazole  40 mg Oral QHS  . PARoxetine  40 mg Oral Daily  . rosuvastatin  20 mg Oral Daily  . sodium zirconium cyclosilicate  5 g Oral Once   Infusions:  . sodium  chloride    . heparin 750 Units/hr (06/09/18 1958)  . methocarbamol (ROBAXIN) IV     Assessment: 65 yoF with ESRD on HD and afib on apixaban presents after a fall and hip fx.  5/15 IM nail hip Fx. Home Apixaban 5mg  bid, last dose 5/13 at 0500  - Apixaban artificially elevates Heparin levels, will order baseline aPTT and Heparin level.  Anticipate Heparin level will be low with last dose Apixaban 3 days ago  Today, 06/09/2018 First Heparin level drawn 1830 = 0.88 units/ml, above desired range  Goal of Therapy:  Heparin level 0.3-0.7 units/ml aPTT 66-102 seconds Monitor platelets by anticoagulation protocol: Yes   Plan:  Reduce Heparin to 750 units/hr Recheck hep level in 8 hr Daily CBC ordered x 3 days (last 5/18) Daily Hep level when at steady state  [name]  [phone] till 3:30p [phone] afterward 06/09/2018,8:05 PM

## 2018-06-10 ENCOUNTER — Inpatient Hospital Stay (HOSPITAL_COMMUNITY): Payer: Medicare Other

## 2018-06-10 DIAGNOSIS — N185 Chronic kidney disease, stage 5: Secondary | ICD-10-CM

## 2018-06-10 DIAGNOSIS — T82898A Other specified complication of vascular prosthetic devices, implants and grafts, initial encounter: Secondary | ICD-10-CM

## 2018-06-10 DIAGNOSIS — I482 Chronic atrial fibrillation, unspecified: Secondary | ICD-10-CM

## 2018-06-10 DIAGNOSIS — E1142 Type 2 diabetes mellitus with diabetic polyneuropathy: Secondary | ICD-10-CM

## 2018-06-10 LAB — CBC
HCT: 34.7 % — ABNORMAL LOW (ref 36.0–46.0)
Hemoglobin: 11.2 g/dL — ABNORMAL LOW (ref 12.0–15.0)
MCH: 30.9 pg (ref 26.0–34.0)
MCHC: 32.3 g/dL (ref 30.0–36.0)
MCV: 95.9 fL (ref 80.0–100.0)
Platelets: 165 10*3/uL (ref 150–400)
RBC: 3.62 MIL/uL — ABNORMAL LOW (ref 3.87–5.11)
RDW: 19.7 % — ABNORMAL HIGH (ref 11.5–15.5)
WBC: 14.2 10*3/uL — ABNORMAL HIGH (ref 4.0–10.5)
nRBC: 0.4 % — ABNORMAL HIGH (ref 0.0–0.2)

## 2018-06-10 LAB — GLUCOSE, CAPILLARY
Glucose-Capillary: 108 mg/dL — ABNORMAL HIGH (ref 70–99)
Glucose-Capillary: 120 mg/dL — ABNORMAL HIGH (ref 70–99)
Glucose-Capillary: 130 mg/dL — ABNORMAL HIGH (ref 70–99)
Glucose-Capillary: 132 mg/dL — ABNORMAL HIGH (ref 70–99)
Glucose-Capillary: 134 mg/dL — ABNORMAL HIGH (ref 70–99)
Glucose-Capillary: 162 mg/dL — ABNORMAL HIGH (ref 70–99)

## 2018-06-10 LAB — RENAL FUNCTION PANEL
Albumin: 2.9 g/dL — ABNORMAL LOW (ref 3.5–5.0)
Anion gap: 20 — ABNORMAL HIGH (ref 5–15)
BUN: 54 mg/dL — ABNORMAL HIGH (ref 8–23)
CO2: 22 mmol/L (ref 22–32)
Calcium: 8.9 mg/dL (ref 8.9–10.3)
Chloride: 94 mmol/L — ABNORMAL LOW (ref 98–111)
Creatinine, Ser: 6.66 mg/dL — ABNORMAL HIGH (ref 0.44–1.00)
GFR calc Af Amer: 7 mL/min — ABNORMAL LOW (ref >60)
GFR calc non Af Amer: 6 mL/min — ABNORMAL LOW (ref >60)
Glucose, Bld: 134 mg/dL — ABNORMAL HIGH (ref 70–99)
Phosphorus: 8.5 mg/dL — ABNORMAL HIGH (ref 2.5–4.6)
Potassium: 5.1 mmol/L (ref 3.5–5.1)
Sodium: 136 mmol/L (ref 135–145)

## 2018-06-10 LAB — HEPARIN LEVEL (UNFRACTIONATED): Heparin Unfractionated: 0.49 IU/mL (ref 0.30–0.70)

## 2018-06-10 LAB — TROPONIN I
Troponin I: 0.81 ng/mL (ref <0.03)
Troponin I: 0.62 ng/mL (ref <0.03)

## 2018-06-10 LAB — OCCULT BLOOD GASTRIC / DUODENUM (SPECIMEN CUP): Occult Blood, Gastric: NEGATIVE

## 2018-06-10 MED ORDER — METOPROLOL TARTRATE 5 MG/5ML IV SOLN
5.0000 mg | Freq: Three times a day (TID) | INTRAVENOUS | Status: DC
  Administered 2018-06-10 – 2018-06-11 (×3): 5 mg via INTRAVENOUS
  Filled 2018-06-10 (×3): qty 5

## 2018-06-10 MED ORDER — POLYETHYLENE GLYCOL 3350 17 G PO PACK
17.0000 g | PACK | Freq: Every day | ORAL | Status: DC
  Administered 2018-06-11 – 2018-06-14 (×3): 17 g via ORAL
  Filled 2018-06-10 (×4): qty 1

## 2018-06-10 MED ORDER — PROCHLORPERAZINE EDISYLATE 10 MG/2ML IJ SOLN
10.0000 mg | INTRAMUSCULAR | Status: DC | PRN
  Filled 2018-06-10: qty 2

## 2018-06-10 MED ORDER — METOPROLOL TARTRATE 5 MG/5ML IV SOLN
5.0000 mg | Freq: Once | INTRAVENOUS | Status: AC
  Administered 2018-06-10: 5 mg via INTRAVENOUS
  Filled 2018-06-10: qty 5

## 2018-06-10 MED ORDER — APIXABAN 5 MG PO TABS: 5.0000 mg | ORAL_TABLET | Freq: Two times a day (BID) | ORAL | Status: DC

## 2018-06-10 MED ORDER — PANTOPRAZOLE SODIUM 40 MG IV SOLR
40.0000 mg | Freq: Every day | INTRAVENOUS | Status: DC
  Administered 2018-06-10 – 2018-06-12 (×3): 40 mg via INTRAVENOUS
  Filled 2018-06-10 (×3): qty 40

## 2018-06-10 MED ORDER — DILTIAZEM LOAD VIA INFUSION
20.0000 mg | Freq: Once | INTRAVENOUS | Status: AC
  Administered 2018-06-10: 20 mg via INTRAVENOUS
  Filled 2018-06-10: qty 20

## 2018-06-10 MED ORDER — PANTOPRAZOLE SODIUM 40 MG IV SOLR
40.0000 mg | Freq: Once | INTRAVENOUS | Status: AC
  Administered 2018-06-10: 40 mg via INTRAVENOUS
  Filled 2018-06-10: qty 40

## 2018-06-10 MED ORDER — CHLORHEXIDINE GLUCONATE CLOTH 2 % EX PADS
6.0000 | MEDICATED_PAD | Freq: Every day | CUTANEOUS | Status: DC
  Administered 2018-06-11: 6 via TOPICAL

## 2018-06-10 MED ORDER — PANTOPRAZOLE SODIUM 20 MG PO TBEC: 20.0000 mg | DELAYED_RELEASE_TABLET | Freq: Once | ORAL | Status: DC

## 2018-06-10 MED ORDER — MORPHINE SULFATE (PF) 2 MG/ML IV SOLN
1.0000 mg | INTRAVENOUS | Status: DC | PRN
  Administered 2018-06-11: 1 mg via INTRAVENOUS
  Filled 2018-06-10: qty 1

## 2018-06-10 MED ORDER — HEPARIN (PORCINE) 25000 UT/250ML-% IV SOLN
750.0000 [IU]/h | INTRAVENOUS | Status: DC
  Administered 2018-06-10: 750 [IU]/h via INTRAVENOUS
  Filled 2018-06-10: qty 250

## 2018-06-10 MED ORDER — METOPROLOL TARTRATE 25 MG PO TABS: 25.0000 mg | ORAL_TABLET | Freq: Three times a day (TID) | ORAL | Status: DC

## 2018-06-10 MED ORDER — METOPROLOL SUCCINATE ER 25 MG PO TB24: 25.0000 mg | ORAL_TABLET | Freq: Every day | ORAL | Status: DC

## 2018-06-10 MED ORDER — DILTIAZEM HCL-DEXTROSE 100-5 MG/100ML-% IV SOLN (PREMIX)
5.0000 mg/h | INTRAVENOUS | Status: DC
  Administered 2018-06-10 – 2018-06-11 (×2): 5 mg/h via INTRAVENOUS
  Filled 2018-06-10 (×3): qty 100

## 2018-06-10 MED ORDER — ACETAMINOPHEN 10 MG/ML IV SOLN
1000.0000 mg | Freq: Four times a day (QID) | INTRAVENOUS | Status: DC
  Administered 2018-06-10 – 2018-06-11 (×3): 1000 mg via INTRAVENOUS
  Filled 2018-06-10 (×4): qty 100

## 2018-06-10 MED ORDER — PROMETHAZINE HCL 25 MG/ML IJ SOLN: 12.5000 mg | Freq: Four times a day (QID) | INTRAMUSCULAR | Status: DC | PRN

## 2018-06-10 NOTE — Progress Notes (Addendum)
Family Medicine Teaching Service Daily Progress Note Intern Pager: [phone]  Patient name: Christine Santos Medical record number: [id number] Date of birth: 1945/03/18 Age: 73 y.o. Gender: female  Primary Care Provider: [name], MD Consultants: Orthopedics Code Status: Full code  Pt Overview and Major Events to Date:  5/13: admitted to [facility] for R femur fracture 5/15: ORIF of right femur  Assessment and Plan: Christine Santos is a 73 y.o. female who presented after a fall and subsequent displaced intertrochanteric fracture of right femur. PMHx significant for IBS, reflux, diabetic neuropathy, IDDM, paroxysmal A. fib on Eliquis, HTN, systolic heart failure, PVD, sinus bradycardia, CAD, stress incontinence, ESRD on HD MWF, secondary hyperparathyroidism, ACD.  Vomiting and Chest Burning Patient notes that she has had burning chest pain since this AM.  Also having nausea and vomiting.  Vomit appears dark brownish red in color.  Patient also with diaphoresis. Decreased bowel sounds on exam.  Patient with very minimal intake this AM and nothing that would color emesis in this way.  Concern for postop ileus and ACS. Patient also tachycardic to 200 while vomiting and quickly improves to 120s, had not yet received AM metop given emesis.  Following receiving metoprolol, she continued to sustain in 190s, therefore will start dilt gtt. - troponin - EKG  - AXR - FOBT to emesis - NPO pending improvement and work up - hold eliquis pending FOBT: SCD to left left while off anticoagulation - compazine prn nausea given prolonged QTc - dilt gtt - transfer to progressive  Acute Hypoxic Respiratory Failure  Occurred following surgery, desats to 80s.  Has remained on 2L overnight with good O2 sats and no respiratory distress.   - O2 prn - wean as tolerated  Right femur fracture s/p ORIF: POD 2 s/p ORIF right femur.  Pain continues to be well-controlled.  Per Ortho, planning for continued therapy  today. - started Eliquis today, holding now pending workup above -PT/OT - Ortho consulted, appreciate care and recs - f/u outpatient 2 weeks for staple removal - Norco 5-325 for pain score 4-6 and 7.5-325 for pain score 7-10 q4 PRN - continue to hold home PRN tramadol - fall precautions - incentive spirometry, O2 PRN  - weight bearing as tolerated, up with assistance - reinforce dressing  Concern for LUE Ischemia: Initial concern as well as cold to palpation, duplex with flow to left hand but reduced consistent with steal syndrome.  Patient seen by vascular surgery who noted that given her deconditioned state and chronicity of symptoms, will follow-up as outpatient in a few weeks. -Follow-up VS as outpatient  ESRD with HD MWF  Anemia of Chronic Disease  2` Hyperparathyroidism: HD on 0/25 without complications. K 5.1. Hgb 12.2>11.8>11.2. - HD and electrolytes per nephro - continue to monitor K+ - Daily RFP - Continue home calcitriol and Auryxia - Renal diet - Avoid continuous fluids  A.Fib  Prolonged QTc on EKG: Home meds: Metoprolol XL 25mg  QD, Eliquis 5mg .  Tachycardic this AM consistently following metoprolol, see above. - follow up AM EKG - start dilt gtt - Continue home Metoprolol - Hold home Eliquis, heparin drip given possible hematemsis, left SCD while off anticoagulation - Telemetry, daily EKG - Avoid QT prolonging agents  IDDM: Home meds: Lantus 12U and Humalog 5U TID with meals. Received novolog 9u in last 24 hrs and started Lantus 6u yesterday. - sSSI - cont 6U Lantus qAM, plan to increase as tolerated, although patient now NPO with emesis - CBG  with meals  HTN:  BP 138/75 this AM. Home meds: Hydralazine 50mg  QD and Metoprolol 25mg  QD - Continue home Metoprolol  - Continue to hold home hydral given soft BP's  CHF:  Last echo 2017, EF 55-60%. Home meds: Metoprolol XL 25mg  QD.  - Continue home Metoprolol 25mg  QD, IV today given emesis  Depression: Home  meds: Paxil 40mg  QD - continue home meds  HLD:  - Continue home Rosuvastatin 20mg   GERD:  - Continue home Protonix 40mg   Gout:  - Restart home Allopurinol 50mg  QD  Insomnia:  - Can restart home Melatonin 10mg  qHS PRN for sleep if requested  FEN/GI: Renal diet PPx: Lovenox  Disposition: Pending PT/OT recommendations when medically stable    Subjective:  Patient not feeling well this AM.  Pain is well-controlled, but complaining of burning chest pain since this AM.  Also now with nausea and vomiting.  Has only been able to tolerate a few small bites of graham cracker and sips of vanilla ensure.    Objective: Temp:  [98.2 F (36.8 C)-98.8 F (37.1 C)] 98.2 F (36.8 C) (05/17 0920) Pulse Rate:  [95-124] 120 (05/17 0920) Resp:  [18-19] 18 (05/17 0338) BP: (72-143)/(49-102) 138/75 (05/17 0920) SpO2:  [82 %-98 %] 97 % (05/17 0920) Weight:  [75.3 kg] 75.3 kg (05/17 0338)  Physical Exam:  General: 73 y.o. female mildly ill-appearing, vomiting reddish brown emesis during exam, diaphoretic Cardio: irregularly irregular Lungs: CTAB, no wheezing, no rhonchi, no crackles, no IWOB on 2L per [place] Abdomen: Soft, non-tender to palpation, non-distended, decreased bowel sounds Skin: warm and dry Extremities: bandage on right leg   Laboratory: Recent Labs  Lab 06/08/18 0801 06/09/18 0430 06/10/18 0350  WBC 14.9* 17.6* 14.2*  HGB 12.2 11.8* 11.2*  HCT 38.7 38.6 34.7*  PLT 151 133* 165   Recent Labs  Lab 06/07/18 0353 06/08/18 0802 06/09/18 0430 06/09/18 1837 06/10/18 0350  NA 140 136 137  --  136  K 5.0 6.2* 5.8* 5.5* 5.1  CL 95* 94* 97*  --  94*  CO2 29 26 20*  --  22  BUN 27* 46* 28*  --  54*  CREATININE 5.35* 7.34* 5.07*  --  6.66*  CALCIUM 9.3 8.9 8.8*  --  8.9  PROT 5.4*  --   --   --   --   BILITOT 0.9  --   --   --   --   ALKPHOS 150*  --   --   --   --   ALT 64*  --   --   --   --   AST 49*  --   --   --   --   GLUCOSE 137* 168* 174*  --  134*     Imaging/Diagnostic Tests: Ct Angio Chest Pe W Or Wo Contrast  Result Date: 06/09/2018 CLINICAL DATA:  73 year old who underwent ORIF of a RIGHT femoral neck fracture yesterday, now presenting with acute tachycardia and shortness of breath. EXAM: CT ANGIOGRAPHY CHEST WITH CONTRAST TECHNIQUE: Multidetector CT imaging of the chest was performed using the standard protocol during bolus administration of intravenous contrast. Multiplanar CT image reconstructions and MIPs were obtained to evaluate the vascular anatomy. CONTRAST:  24mL OMNIPAQUE IOHEXOL 350 MG/ML IV. COMPARISON:  CT chest 10/27/2017. FINDINGS: Cardiovascular: Contrast opacification of the pulmonary arteries is excellent. No filling defects within either main pulmonary artery or their segmental branches in either lung to suggest pulmonary embolism. Heart moderately enlarged with RIGHT atrial  and RIGHT ventricular enlargement in particular. Mitral and aortic annular calcification. Extensive native three-vessel coronary atherosclerosis with prior CABG. Moderate atherosclerosis involving the thoracic and proximal abdominal aorta without evidence of aneurysm. Aberrant RIGHT subclavian artery which arises from the distal aortic arch and courses POSTERIOR to the esophagus. Mediastinum/Nodes: No pathologically enlarged mediastinal, hilar or axillary lymph nodes. No mediastinal masses. Normal-appearing esophagus. Multiple thyroid nodules as noted on the prior CT. Lungs/Pleura: Small BILATERAL pleural effusions, RIGHT greater than LEFT. Associated atelectasis in the lower lobes. No confluent or ground-glass airspace consolidation in either lung. No parenchymal nodules or masses. No evidence of interstitial edema. Central airways patent without significant bronchial wall thickening. Upper Abdomen: No acute findings. Musculoskeletal: Degenerative changes and DISH involving the thoracic spine. Degenerative disc disease and spondylosis involving the lower  cervical spine. No acute findings. Review of the MIP images confirms the above findings. IMPRESSION: 1. No evidence of pulmonary embolism. 2. Small bilateral pleural effusions, right greater than left, with associated mild passive atelectasis in the lower lobes. No acute cardiopulmonary disease otherwise. 3. Moderate cardiomegaly with right atrial and right ventricular enlargement in particular. 4. Aberrant right subclavian artery. Aortic Atherosclerosis (ICD10-I70.0). Electronically Signed   By: Evangeline Dakin M.D.   On: 06/09/2018 15:19   Vas [place] Upper Extremity Arterial Duplex  Result Date: 06/09/2018 UPPER EXTREMITY DUPLEX STUDY Indications: Cold extremity. LUE AVG.  Performing Technologist: Maudry Mayhew MHA, RDMS, RVT, RDCS  Examination Guidelines: A complete evaluation includes B-mode imaging, spectral Doppler, color Doppler, and power Doppler as needed of all accessible portions of each vessel. Bilateral testing is considered an integral part of a complete examination. Limited examinations for reoccurring indications may be performed as noted.  Left Doppler Findings: +-----------+----------+-------------------+------+--------+ Site       PSV (cm/s)Waveform           PlaqueComments +-----------+----------+-------------------+------+--------+ Subclavian 129       monophasic                        +-----------+----------+-------------------+------+--------+ Axillary   224       monophasic                        +-----------+----------+-------------------+------+--------+ Brachial   323       monophasic                        +-----------+----------+-------------------+------+--------+ Radial     59        dampened monophasic               +-----------+----------+-------------------+------+--------+ Palmar Arch18        dampened monophasic               +-----------+----------+-------------------+------+--------+ AVG anastomosis is at distal upper arm.  Summary:   Left: Arterial flow is patent through to the hand, however there is       significantly reduced flow distal to the anastomosis at the       distal upper arm. *See table(s) above for measurements and observations.    Preliminary     Wendelin Bradt, Bernita Raisin, DO 06/10/2018, 9:56 AM PGY-1, [facility] Intern pager: [phone], text pages welcome

## 2018-06-10 NOTE — Progress Notes (Signed)
PT Cancellation Note  Patient Details Name: Christine Santos MRN: [id number] DOB: 1945/11/02   Cancelled Treatment:    Reason Eval/Treat Not Completed: Medical issues which prohibited therapy Discussed with RN, patient resting HR 200's with possible vomiting blood. Noted chest pain and concerns from DO. Therapy will hold at this time and cont to follow.   Reinaldo Berber, PT, DPT Acute Rehabilitation Services Pager: [phone] Office: [facility] 06/10/2018, 11:13 AM

## 2018-06-10 NOTE — Progress Notes (Signed)
Subjective: 2 Days Post-Op Procedure(s) (LRB): INTRAMEDULLARY (IM) NAIL INTERTROCHANTRIC (Right) Patient reports pain as moderate right hip.  Some nausea again this AM.Appears comfortable.   Objective: Vital signs in last 24 hours: Temp:  [97.5 F (36.4 C)-98.8 F (37.1 C)] 98.4 F (36.9 C) (05/17 0338) Pulse Rate:  [72-124] 108 (05/17 0338) Resp:  [18-19] 18 (05/17 0338) BP: (72-143)/(49-102) 111/78 (05/17 0338) SpO2:  [82 %-100 %] 98 % (05/17 0338) Weight:  [75.3 kg] 75.3 kg (05/17 0338)  Intake/Output from previous day: 05/16 0701 - 05/17 0700 In: 516.2 [P.O.:368; I.V.:148.2] Out: -  Intake/Output this shift: No intake/output data recorded.  Recent Labs    06/08/18 0801 06/09/18 0430 06/10/18 0350  HGB 12.2 11.8* 11.2*   Recent Labs    06/09/18 0430 06/10/18 0350  WBC 17.6* 14.2*  RBC 3.83* 3.62*  HCT 38.6 34.7*  PLT 133* 165   Recent Labs    06/09/18 0430 06/09/18 1837 06/10/18 0350  NA 137  --  136  K 5.8* 5.5* 5.1  CL 97*  --  94*  CO2 20*  --  22  BUN 28*  --  54*  CREATININE 5.07*  --  6.66*  GLUCOSE 174*  --  134*  CALCIUM 8.8*  --  8.9   Recent Labs    06/09/18 0619  INR 1.2   Right lower extremity: Dorsiflexion/Plantar flexion intact Incision: dressing C/D/I Compartment soft   Assessment/Plan: 2 Days Post-Op Procedure(s) (LRB): INTRAMEDULLARY (IM) NAIL INTERTROCHANTRIC (Right) Up with therapy      Namita Yearwood 06/10/2018, 8:00 AM

## 2018-06-10 NOTE — Consult Note (Signed)
Referring Physician: Dr Tarry Kos  Patient name: Christine Santos MRN: [id number] DOB: 06-06-1945 Sex: female  REASON FOR CONSULT: left hand coolness numbness  HPI: Christine Santos is a 73 y.o. female,  S/p placement left upper arm graft by Dr Donzetta Matters in 2/20.  Pt has had some numbness and tingling and coolness in that hand since that time.  She has had some decrease in her left hand grip strength.  No ulcers on the digits.  This was not acute onset and has been slowly progressive over several months.  She was admitted 3 days ago with right femur fracture after a fall ORIF of 5/15.  Other medical problems include afib on eliquis at home, diabetes, hypertension, diabetes which are all stable.  She is overall weak and deconditioned currently.   Past Medical History:  Diagnosis Date  . Anemia 08/29/2011  . Anxiety   . Arthritis    "legs" (01/22/2013)  . CAD (coronary artery disease)    s/p CABG in 1999  . Cataract   . Chronic kidney disease    Sees Dr [name]  . Chronic lymphocytic leukemia ([place]) 10/13/2006   Qualifier: Diagnosis of  By: Andria Frames MD, Gwyndolyn Saxon    . 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 Diastolic heart failure ([place])   . Dysrhythmia    PAF  . Enteritis due to Clostridium difficile 07/02/2017  . GERD (gastroesophageal reflux disease)   . Gout   . H/O hiatal hernia   . Headache    "years ago migraines, none in along time."  . Heart murmur   . Hyperlipidemia   . Hypertension   . 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 Myocardial infarction ([place])    before 1995  . PAD (peripheral artery disease) ([place])   . Paroxysmal atrial fibrillation (H[facility])    a. identified on ILR as part of StrokeAF study  . Peripheral vascular disease ([place])   . Pneumonia 11/2016  . Stress incontinence, female 11/11/2010  . 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 Still has balance issues 03/05/2018- vision improved,  balance improved , back on walker since hospitaization,    Past Surgical History:  Procedure Laterality Date  . A/V FISTULAGRAM Left 12/25/2017   Procedure: A/V FISTULAGRAM;  Surgeon: Waynetta Sandy, MD;  Location: [facility];  Service: Cardiovascular;  Laterality: Left;  . A/V SHUNT INTERVENTION Left 12/25/2017   Procedure: A/V SHUNT INTERVENTION;  Surgeon: Waynetta Sandy, MD;  Location: [facility];  Service: Cardiovascular;  Laterality: Left;  . ANGIOPLASTY / STENTING FEMORAL Right 09/2010   SFA/notes 11/25/2010 (01/22/2013)  . ANGIOPLASTY / STENTING FEMORAL Left 11/2010   SFA/notes 11/25/2010 (01/22/2013)  . ANGIOPLASTY / STENTING ILIAC     [name] 11/25/2010 (01/22/2013)  . AV FISTULA PLACEMENT Left 03/07/2018   Procedure: CONVERSION TO  ARTERIOVENOUS Artegraft GRAFT ARM;  Surgeon: Waynetta Sandy, MD;  Location: [facility];  Service: Vascular;  Laterality: Left;  . BASCILIC VEIN TRANSPOSITION Left 08/23/2017   Procedure: FIRST STAGE BASILIC VEIN TRANSPOSITION LEFT UPPER EXTREMITY;  Surgeon: [name], MD;  Location: [facility];  Service: Vascular;  Laterality: Left;  . BASCILIC VEIN TRANSPOSITION Left 10/23/2017   Procedure: SECOND STAGE BASILIC VEIN TRANSPOSITION LEFT ARM;  Surgeon: [name], MD;  Location: [facility];  Service: Vascular;  Laterality: Left;  . CATARACT EXTRACTION W/ INTRAOCULAR LENS  IMPLANT, BILATERAL Bilateral ?2011  . CHOLECYSTECTOMY  1993  . CORONARY ARTERY BYPASS GRAFT  10/1994   "CABG X3"  . EP IMPLANTABLE DEVICE N/A 09/11/2015   Procedure: Loop Recorder Insertion;  Surgeon: Thompson Grayer, MD;  Location: [facility];  Service: Cardiovascular;  Laterality: N/A;  . HEEL SPUR EXCISION Bilateral 1970's  . IR FLUORO GUIDE CV LINE RIGHT  11/01/2017  . IR FLUORO GUIDE CV LINE RIGHT  11/05/2017  . IR US GUIDE VASC ACCESS RIGHT  11/01/2017  . IR US GUIDE VASC ACCESS RIGHT  11/05/2017  . LOWER EXTREMITY ANGIOGRAM N/A  01/22/2013   Procedure: LOWER EXTREMITY ANGIOGRAM;  Surgeon: [name], MD;  Location: [facility];  Service: Cardiovascular;  Laterality: N/A;  . SHOULDER OPEN ROTATOR CUFF REPAIR Bilateral 1990's   "2 times on 1 side; once on the other"  . TRANSLUMINAL ATHERECTOMY FEMORAL ARTERY Right 01/22/2013   & balloon  . TUBAL LIGATION  1980's  . VIDEO ASSISTED THORACOSCOPY (VATS)/EMPYEMA Left 11/29/2016   Procedure: VIDEO ASSISTED THORACOSCOPY (VATS)/EMPYEMA;  Surgeon: [name], MD;  [facility];  Service: Thoracic;  Laterality: Left;  VIDEO ASSISTED THORACOSCOPY (VATS)/EMPYEMA    Family History  Problem Relation Age of Onset  . Heart disease Mother   . Heart attack Mother   . Hypertension Mother   . Kidney disease Mother   . Pneumonia Father   . Heart attack Father   . Hyperlipidemia Father   . Diabetes Sister   . Breast cancer Sister   . Arthritis Brother   . Diabetes Brother   . Heart attack Sister   . Obesity Sister   . Hypertension Sister   . Heart attack Sister   . Cancer Sister        breast  . Hyperlipidemia Sister   . Hypertension Sister   . Hyperlipidemia Sister   . Breast cancer Maternal Grandmother     SOCIAL HISTORY: Social History   Socioeconomic History  . Marital status: Divorced    Spouse name: Not on file  . Number of children: 2  . Years of education: 50  . Highest education level: Not on file  Occupational History  . Occupation: DISABILITY    Employer: OTHER    Comment: Worked at [employer]  . Financial resource strain: Not on file  . Food insecurity:    Worry: Not on file    Inability: Not on file  . Transportation needs:    Medical: Not on file    Non-medical: Not on file  Tobacco Use  . Smoking status: Never Smoker  . Smokeless tobacco: Never Used  Substance and Sexual Activity  . Alcohol use: No  . Drug use: No  . Sexual activity: Not Currently  Lifestyle  . Physical activity:    Days per week: Not on  file    Minutes per session: Not on file  . Stress: Not on file  Relationships  . Social connections:    Talks on phone: Not on file    Gets together: Not on file    Attends religious service: Not on file    Active member of club or organization: Not on file    Attends meetings of clubs or organizations: Not on file    Relationship status: Not on file  . Intimate partner violence:    Fear of current or ex partner: Not on file    Emotionally abused: Not on file  Physically abused: Not on file    Forced sexual activity: Not on file  Other Topics Concern  . Not on file  Social History Narrative   Health Care POA:    Emergency Contact: sister, [name] (h) [phone]   End of Life Plan:    Who lives with you: female friend   Any pets: none   Diet: Pt has a varied diet of protein, starch, vegetables   Exercise: Pt does not have any regular exercise routine.   Seatbelts: Pt reports wearing seatbelt when in vehicles.    Sun Exposure/Protection: Pt reports using sun screen.   Hobbies: going to lake, biking, blue grass music      Current Social History 08/16/2016        Patient lives with [name] (Significant other) in one level home 08/16/2016   Transportation: Patient has own vehicle 08/16/2016   Important Relationships [name] and sisters, [name] and Vaughan Basta 08/16/2016    Pets: None 08/16/2016   Education / Work:  12 th grade/ Disabled ([employer]) 08/16/2016   Interests / Fun: Going to [facility], out to eat with friends 08/16/2016   Current Stressors: Bilat foot pain (neuropathy) 08/16/2016   Religious / Personal Beliefs: Raised Methodist, goes to [employer] 08/16/2016   L. Ducatte, RN, BSN                                                                                                            Allergies  Allergen Reactions  . Cefepime Anaphylaxis    Throat swelling  . Amoxicillin Rash    Did it involve swelling of the face/tongue/throat, SOB, or low BP? No Did  it involve sudden or severe rash/hives, skin peeling, or any reaction on the inside of your mouth or nose? No Did you need to seek medical attention at a hospital or doctor's office? No When did it last happen?10+ years If all above answers are "NO", may proceed with cephalosporin use.    . Codeine Phosphate Rash  . Penicillins Rash    Did it involve swelling of the face/tongue/throat, SOB, or low BP? No Did it involve sudden or severe rash/hives, skin peeling, or any reaction on the inside of your mouth or nose? No Did you need to seek medical attention at a hospital or doctor's office? No When did it last happen?10+ years If all above answers are "NO", may proceed with cephalosporin use.     Marland Kitchen Propoxyphene Rash    Current Facility-Administered Medications  Medication Dose Route Frequency Provider Last Rate Last Dose  . 0.9 %  sodium chloride infusion   Intravenous Continuous Leandrew Koyanagi, MD      . acetaminophen (TYLENOL) tablet 325-650 mg  325-650 mg Oral Q6H PRN Leandrew Koyanagi, MD   650 mg at 06/10/18 0615  . allopurinol (ZYLOPRIM) tablet 50 mg  50 mg Oral Daily Mullis, Kiersten P, DO   50 mg at 06/09/18 0943  . alum & mag hydroxide-simeth (MAALOX/MYLANTA) 200-200-20 MG/5ML suspension 30 mL  30 mL  Oral Q4H PRN Leandrew Koyanagi, MD      . apixaban Arne Cleveland) tablet 5 mg  5 mg Oral BID Shirley, [place], DO      . calcitRIOL (ROCALTROL) capsule 0.25 mcg  0.25 mcg Oral Q M,W,F-HD Leandrew Koyanagi, MD   0.25 mcg at 06/08/18 0910  . Chlorhexidine Gluconate Cloth 2 % PADS 6 each  6 each Topical Daily Leandrew Koyanagi, MD   6 each at 06/09/18 [phone]  . docusate sodium (COLACE) capsule 100 mg  100 mg Oral BID Leandrew Koyanagi, MD   100 mg at 06/09/18 2053  . ferric citrate (AURYXIA) tablet 420 mg  420 mg Oral TID WC Leandrew Koyanagi, MD   420 mg at 06/07/18 1738  . heparin ADULT infusion 100 units/mL (25000 units/212mL sodium chloride 0.45%)  750 Units/hr Intravenous Continuous [name], RPH  7.5 mL/hr at 06/09/18 1958 750 Units/hr at 06/09/18 1958  . HYDROcodone-acetaminophen (NORCO) 7.5-325 MG per tablet 1-2 tablet  1-2 tablet Oral Q4H PRN Leandrew Koyanagi, MD      . HYDROcodone-acetaminophen (NORCO/VICODIN) 5-325 MG per tablet 1-2 tablet  1-2 tablet Oral Q4H PRN Leandrew Koyanagi, MD      . insulin aspart (novoLOG) injection 0-9 Units  0-9 Units Subcutaneous Q4H Leandrew Koyanagi, MD   1 Units at 06/10/18 0443  . insulin glargine (LANTUS) injection 6 Units  6 Units Subcutaneous Daily Mullis, Kiersten P, DO   6 Units at 06/09/18 0943  . menthol-cetylpyridinium (CEPACOL) lozenge 3 mg  1 lozenge Oral PRN Leandrew Koyanagi, MD       Or  . phenol (CHLORASEPTIC) mouth spray 1 spray  1 spray Mouth/Throat PRN Leandrew Koyanagi, MD      . methocarbamol (ROBAXIN) tablet 500 mg  500 mg Oral Q6H PRN Leandrew Koyanagi, MD      . metoprolol succinate (TOPROL-XL) 24 hr tablet 25 mg  25 mg Oral Daily Leandrew Koyanagi, MD   25 mg at 06/09/18 0909  . morphine 2 MG/ML injection 1 mg  1 mg Intravenous Q2H PRN Leandrew Koyanagi, MD      . mupirocin ointment (BACTROBAN) 2 % 1 application  1 application Nasal BID Leandrew Koyanagi, MD   1 application at 62/83/66 2054  . ondansetron (ZOFRAN) tablet 4 mg  4 mg Oral Q6H PRN Leandrew Koyanagi, MD       Or  . ondansetron [facility]) injection 4 mg  4 mg Intravenous Q6H PRN Leandrew Koyanagi, MD      . pantoprazole (PROTONIX) EC tablet 40 mg  40 mg Oral QHS Leandrew Koyanagi, MD   40 mg at 06/09/18 2054  . PARoxetine (PAXIL) tablet 40 mg  40 mg Oral Daily Leandrew Koyanagi, MD   40 mg at 06/09/18 0943  . polyethylene glycol (MIRALAX / GLYCOLAX) packet 17 g  17 g Oral Daily PRN Leandrew Koyanagi, MD      . rosuvastatin (CRESTOR) tablet 20 mg  20 mg Oral Daily Leandrew Koyanagi, MD   20 mg at 06/09/18 0943  . sorbitol 70 % solution 30 mL  30 mL Oral Daily PRN Leandrew Koyanagi, MD        ROS:   General:  No weight loss, Fever, chills  Cardiac: No recent episodes of chest pain/pressure, no shortness of breath at rest.   +shortness of breath with exertion.  + history of atrial fibrillation or irregular heartbeat  Pulmonary: No home oxygen, no productive cough, no hemoptysis,  No asthma or wheezing    Physical Examination  Vitals:   06/09/18 1219 06/09/18 1900 06/09/18 2354 06/10/18 0338  BP: 107/85 111/82 102/77 111/78  Pulse: (!) 108 95 (!) 122 (!) 108  Resp: 18 19 18 18   Temp:  98.6 F (37 C) 98.8 F (37.1 C) 98.4 F (36.9 C)  TempSrc:  Oral Oral Oral  SpO2: 96% 97% 97% 98%  Weight:    75.3 kg  Height:        Body mass index is 27.62 kg/m.  General:  Alert and oriented, no acute distress HEENT: Normal Neck: No JVD Pulmonary: Clear to auscultation bilaterally Cardiac: Regular Rate and Rhythm  Skin: No rash or ulcer Extremity Pulses:  Absent left radial pulse, hand cool but well perfused, + thrill in left upper arm graft Musculoskeletal: No deformity or edema  Neurologic: Upper and lower extremity motor 5/5 and symmetric  DATA:  Duplex has flow to left hand but reduced consistent with steal  ASSESSMENT: Ischemic steal left hand moderate symptoms secondary to left arm AV graft   PLAN:  In light of pt current deconditioned state and chronicity of symptoms would see her back as outpt in a few weeks.  Discussed options of banding or ligating graft   Ruta Hinds, MD [facility] Office: [phone] Pager: [phone]

## 2018-06-10 NOTE — Progress Notes (Signed)
IV metoprolol given pt HR sustaining in 180s-200 range. MD called again and stated pt will be transferred to progressive floor.

## 2018-06-10 NOTE — Progress Notes (Signed)
[facility] for Heparin Indication: atrial fibrillation  Allergies  Allergen Reactions  . Cefepime Anaphylaxis    Throat swelling  . Amoxicillin Rash    Did it involve swelling of the face/tongue/throat, SOB, or low BP? No Did it involve sudden or severe rash/hives, skin peeling, or any reaction on the inside of your mouth or nose? No Did you need to seek medical attention at a hospital or doctor's office? No When did it last happen?10+ years If all above answers are "NO", may proceed with cephalosporin use.    . Codeine Phosphate Rash  . Penicillins Rash    Did it involve swelling of the face/tongue/throat, SOB, or low BP? No Did it involve sudden or severe rash/hives, skin peeling, or any reaction on the inside of your mouth or nose? No Did you need to seek medical attention at a hospital or doctor's office? No When did it last happen?10+ years If all above answers are "NO", may proceed with cephalosporin use.     Marland Kitchen Propoxyphene Rash   Patient Measurements: Height: 5\' 5"  (165.1 cm) Weight: 166 lb 0.1 oz (75.3 kg) IBW/kg (Calculated) : 57 Heparin Dosing Weight: 72.9 kg  Vital Signs: Temp: 98.2 F (36.8 C) (05/17 1313) Temp Source: Oral (05/17 1313) BP: 143/73 (05/17 1313) Pulse Rate: 101 (05/17 1313)  Labs: Recent Labs    06/08/18 0801 06/08/18 0802 06/09/18 0430 06/09/18 2426 06/09/18 0915 06/09/18 1837 06/10/18 0350 06/10/18 1147  HGB 12.2  --  11.8*  --   --   --  11.2*  --   HCT 38.7  --  38.6  --   --   --  34.7*  --   PLT 151  --  133*  --   --   --  165  --   APTT  --   --   --   --  29  --   --   --   LABPROT  --   --   --  15.2  --   --   --   --   INR  --   --   --  1.2  --   --   --   --   HEPARINUNFRC  --   --   --   --  0.34 0.88* 0.49  --   CREATININE  --  7.34* 5.07*  --   --   --  6.66*  --   TROPONINI  --   --   --   --   --   --   --  0.81*   Estimated Creatinine Clearance: 7.6 mL/min (A)  (by C-G formula based on SCr of 6.66 mg/dL (H)).  Assessment: 16 yoF with PMH afib on Eliquis PTA presents after a fall and hip fracture. Eliquis held for surgery and had been on IV heparin with plan for transition to Eliquis this PM as FOBT negative. Pharmacy now reconsulted for IV heparin. Dose of Eliquis was NOT given today. Heparin level previously therapeutic on 750 units/hr. Hgb 11.2, pltc WNL stable.  Goal of Therapy:  Heparin level 0.3-0.7 units/ml Monitor platelets by anticoagulation protocol: Yes   Plan:  Resume heparin gtt at 750 units/hr Check heparin level in 8 hours Daily heparin level and CBC F/u plan for transition to Eliquis Monitor s/sx of bleeding  Santhiago Collingsworth N. Gerarda Fraction, PharmD, [place] PGY2 Infectious Diseases Pharmacy Resident Phone: [phone] 06/10/2018,5:04 PM

## 2018-06-10 NOTE — Progress Notes (Signed)
[facility] Progress Note   Subjective:  "im worse than yesterday " throwing up and c/o CP - on protonix and zofran- HR120-130 I see that pt was seen by vascular for some steal to left hand- not requiring emergent intervention   Objective Vitals:   06/09/18 1900 06/09/18 2354 06/10/18 0338 06/10/18 0920  BP: 111/82 102/77 111/78 138/75  Pulse: 95 (!) 122 (!) 108 (!) 120  Resp: 19 18 18    Temp: 98.6 F (37 C) 98.8 F (37.1 C) 98.4 F (36.9 C) 98.2 F (36.8 C)  TempSrc: Oral Oral Oral Oral  SpO2: 97% 97% 98% 97%  Weight:   75.3 kg   Height:        Physical Exam General: Ill appearing elderly female on nasal oxygen  Heart: Tachy, irregular  Lungs: CTA anteriorly  Abdomen: soft NT Extremities: No sig edema;  Dialysis Access: LUE AVG +bruit    Weight change: 4.7 kg   Additional Objective Labs: Basic Metabolic Panel: Recent Labs  Lab 06/08/18 0802 06/09/18 0430 06/09/18 1837 06/10/18 0350  NA 136 137  --  136  K 6.2* 5.8* 5.5* 5.1  CL 94* 97*  --  94*  CO2 26 20*  --  22  GLUCOSE 168* 174*  --  134*  BUN 46* 28*  --  54*  CREATININE 7.34* 5.07*  --  6.66*  CALCIUM 8.9 8.8*  --  8.9  PHOS 6.7*  --   --  8.5*   CBC: Recent Labs  Lab 06/06/18 2059 06/08/18 0801 06/09/18 0430 06/10/18 0350  WBC 12.4* 14.9* 17.6* 14.2*  NEUTROABS 8.4*  --   --   --   HGB 11.9* 12.2 11.8* 11.2*  HCT 38.4 38.7 38.6 34.7*  MCV 99.2 98.2 100.8* 95.9  PLT 143* 151 133* 165   Blood Culture    Component Value Date/Time   SDES BLOOD RIGHT ANTECUBITAL 10/29/2017 1125   SDES BLOOD RIGHT ANTECUBITAL 10/29/2017 1125   SPECREQUEST  10/29/2017 1125    BOTTLES DRAWN AEROBIC AND ANAEROBIC Blood Culture adequate volume   SPECREQUEST  10/29/2017 1125    BOTTLES DRAWN AEROBIC AND ANAEROBIC Blood Culture adequate volume   CULT  10/29/2017 1125    NO GROWTH 5 DAYS Performed at [facility], [address], [place], [place] [address]    CULT  10/29/2017 1125   NO GROWTH 5 DAYS Performed at [facility], [place] [address], [place], [place] [address]    REPTSTATUS 11/03/2017 FINAL 10/29/2017 1125   REPTSTATUS 11/03/2017 FINAL 10/29/2017 1125     Medications: . sodium chloride    . heparin 750 Units/hr (06/09/18 1958)   . allopurinol  50 mg Oral Daily  . apixaban  5 mg Oral BID  . calcitRIOL  0.25 mcg Oral Q M,W,F-HD  . Chlorhexidine Gluconate Cloth  6 each Topical Daily  . docusate sodium  100 mg Oral BID  . ferric citrate  420 mg Oral TID WC  . insulin aspart  0-9 Units Subcutaneous Q4H  . insulin glargine  6 Units Subcutaneous Daily  . [START ON 06/11/2018] metoprolol succinate  25 mg Oral Daily  . metoprolol tartrate  5 mg Intravenous Once  . mupirocin ointment  1 application Nasal BID  . pantoprazole  40 mg Oral QHS  . pantoprazole (PROTONIX) IV  40 mg Intravenous Once  . PARoxetine  40 mg Oral Daily  . rosuvastatin  20 mg Oral Daily    Dialysis Orders:  [facility] MWF. EDW68HD Bath 2k,2caTime 4Heparin none. AccessLUA AVGBFR 350DFR 500  Calcitriol 0.30mcg po/HD Last op hgb 11.8 No esa   Assessment/Plan: Pt is a 73 y.o. yo female ESRD who was admitted on 06/06/2018 with right hip fracture  1. Hip fracture-  Mechanical fall- s/p intermedullary nail 5/15. Per ortho.  2. ESRD - normally MWF at [facility] via AVG-  K above 5- has been medically treated, planning for HD tomorrow but feel will be problematic if her HR is not controlled  3. Anemia- has not been an issue of late, no ESA as OP- suspect will go down once has surgery. Hgb 11.2. Follow  4. Secondary hyperparathyroidism- cont home calcitriol and auryxia- phos 6.7 5. HTN/volume- BP seems fine, toprol only- no change. Net UF 1.5L post weight 69.1kg on 5/15. Not quite to EDW by bedweights.  6. Afib-  Symptomatic with rapid rate this am. Has been controlled for most part on beta blocker, eliquis on hold for surgery. Per primary.  Think she needs some ?diltiazim IV to  slow down vs lopressor IV or even dig.  She is not tolerating this fast rhythm well - have discussed with primary team     Corliss Parish, MD 06/10/2018

## 2018-06-10 NOTE — Progress Notes (Signed)
[place] for Heparin Indication: atrial fibrillation, PTA Apixaban held for surgery  Allergies  Allergen Reactions  . Cefepime Anaphylaxis    Throat swelling  . Amoxicillin Rash    Did it involve swelling of the face/tongue/throat, SOB, or low BP? No Did it involve sudden or severe rash/hives, skin peeling, or any reaction on the inside of your mouth or nose? No Did you need to seek medical attention at a hospital or doctor's office? No When did it last happen?10+ years If all above answers are "NO", may proceed with cephalosporin use.    . Codeine Phosphate Rash  . Penicillins Rash    Did it involve swelling of the face/tongue/throat, SOB, or low BP? No Did it involve sudden or severe rash/hives, skin peeling, or any reaction on the inside of your mouth or nose? No Did you need to seek medical attention at a hospital or doctor's office? No When did it last happen?10+ years If all above answers are "NO", may proceed with cephalosporin use.     Marland Kitchen Propoxyphene Rash   Patient Measurements: Height: 5\' 5"  (165.1 cm) Weight: 166 lb 0.1 oz (75.3 kg) IBW/kg (Calculated) : 57 Heparin Dosing Weight: 69.1 kg  Vital Signs: Temp: 98.4 F (36.9 C) (05/17 0338) Temp Source: Oral (05/17 0338) BP: 111/78 (05/17 0338) Pulse Rate: 108 (05/17 0338)  Labs: Recent Labs    06/08/18 0801 06/08/18 0802 06/09/18 0430 06/09/18 4401 06/09/18 0915 06/09/18 1837 06/10/18 0350  HGB 12.2  --  11.8*  --   --   --  11.2*  HCT 38.7  --  38.6  --   --   --  34.7*  PLT 151  --  133*  --   --   --  165  APTT  --   --   --   --  29  --   --   LABPROT  --   --   --  15.2  --   --   --   INR  --   --   --  1.2  --   --   --   HEPARINUNFRC  --   --   --   --  0.34 0.88* 0.49  CREATININE  --  7.34* 5.07*  --   --   --  6.66*   Estimated Creatinine Clearance: 7.6 mL/min (A) (by C-G formula based on SCr of 6.66 mg/dL (H)).  Medical History: Past  Medical History:  Diagnosis Date  . Anemia 08/29/2011  . Anxiety   . Arthritis    "legs" (01/22/2013)  . CAD (coronary artery disease)    s/p CABG in 1999  . Cataract   . Chronic kidney disease    Sees Dr [name]  . Chronic lymphocytic leukemia ([place]) 10/13/2006   Qualifier: Diagnosis of  By: Andria Frames MD, Gwyndolyn Saxon    . 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 Diastolic heart failure ([place])   . Dysrhythmia    PAF  . Enteritis due to Clostridium difficile 07/02/2017  . GERD (gastroesophageal reflux disease)   . Gout   . H/O hiatal hernia   . Headache    "years ago migraines, none in along time."  . Heart murmur   . Hyperlipidemia   . Hypertension   . IDDM (insulin dependent diabetes mellitus) ([place])    Type II  . Myocardial infarction ([place])    before 1995  . PAD (peripheral  artery disease) ([place])   . Paroxysmal atrial fibrillation (HCC)    a. identified on ILR as part of StrokeAF study  . Peripheral vascular disease ([place])   . Pneumonia 11/2016  . Stress incontinence, female 11/11/2010  . 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 Still has balance issues 03/05/2018- vision improved, balance improved , back on walker since hospitaization,    Medications:  Scheduled:  . allopurinol  50 mg Oral Daily  . apixaban  5 mg Oral BID  . calcitRIOL  0.25 mcg Oral Q M,W,F-HD  . Chlorhexidine Gluconate Cloth  6 each Topical Daily  . docusate sodium  100 mg Oral BID  . ferric citrate  420 mg Oral TID WC  . insulin aspart  0-9 Units Subcutaneous Q4H  . insulin glargine  6 Units Subcutaneous Daily  . metoprolol succinate  25 mg Oral Daily  . mupirocin ointment  1 application Nasal BID  . pantoprazole  40 mg Oral QHS  . PARoxetine  40 mg Oral Daily  . rosuvastatin  20 mg Oral Daily   Infusions:  . sodium chloride    . heparin 750 Units/hr (06/09/18 1958)   Assessment: 52 yoF with ESRD on HD and afib on apixaban presents after  a fall and hip fx.  5/15 IM nail hip Fx. Home Apixaban 5mg  bid, last dose 5/13 at 0500  - Apixaban artificially elevates Heparin levels, will order baseline aPTT and Heparin level.  Anticipate Heparin level will be low with last dose Apixaban 3 days ago  Heparin level therapeutic at 0.49 s/p rate decrease  Goal of Therapy:  Heparin level 0.3-0.7 units/ml aPTT 66-102 seconds Monitor platelets by anticoagulation protocol: Yes   Plan:  Continue heparin gtt at 750 units/hr Confirm with AM labs  Bertis Ruddy, PharmD Clinical Pharmacist Please check AMION for all [place] numbers 06/10/2018 5:04 AM

## 2018-06-10 NOTE — Progress Notes (Signed)
OT Cancellation Note  Patient Details Name: Christine Santos MRN: [id number] DOB: 1945-11-30   Cancelled Treatment:    Reason Eval/Treat Not Completed: Medical issues which prohibited therapy- per PT: patient resting HR 200's with possible vomiting blood. Noted chest pain and concerns from DO. Will follow and initiate OT eval when medically appropriate and able.   Delight Stare, OT Acute Rehabilitation Services Pager [phone] Office [phone]   Delight Stare 06/10/2018, 1:11 PM

## 2018-06-10 NOTE — Consult Note (Signed)
CARDIOLOGY CONSULT NOTE       Patient ID: Christine Santos MRN: [id number] DOB/AGE: [birth year] 74 y.o.  Admit date: 06/06/2018 Referring Physician: Sandi Carne Primary Physician: [name], MD Primary Cardiologist: Irish Lack Reason for Consultation: Rapid afib  Principal Problem:   Displaced intertrochanteric fracture of right femur, initial encounter for closed fracture [facility] Active Problems:   Multiple complications of type II diabetes mellitus ([place])   HYPERCHOLESTEROLEMIA   Essential hypertension   Coronary atherosclerosis   SINUS BRADYCARDIA   Chronic systolic heart failure (HCC)   Peripheral vascular disease (HCC)   Reflux esophagitis   Anxiety   Anemia in chronic kidney disease   Diabetic neuropathy (HCC)   Diabetic nephropathy (HCC)   Paroxysmal atrial fibrillation (HCC)   Physical deconditioning   History of stroke   ESRD on dialysis ([place])   High risk social situation   Controlled type 2 diabetes mellitus with complication, with long-term current use of insulin (HCC)   Closed fracture of neck of right femur (HCC)   HPI:  73 y.o. with history of CAD, HTN, DM, CKD, stroke and chronic afib Admitted after mechanical fall with fractured right femoral neck fracture post surgical repair on 5/15. She is seeing [facility] for steel in fistula LUE and is on chronic dialysis Was on eliquis prior to admission No active angina CABG in 1996. EF by echo 55-60% 2017 Normally gets dialysis M/W/F Post op rates have been higher due to surgery, pain and anemia Hct 38.4->34.7 ECG with fib/flutter rate 140's decreased to 90's with repeat cardizem Bolus She is taking POShe is being transferred to 4E currently. 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 identified on ILR as part of StrokeAF study   Peripheral vascular disease ([place])    Pneumonia 11/2016   Stress incontinence, female 11/11/2010   Stroke (cerebrum) ([employer]) 08/2015   "seeing double for 2 weeks" vision normal now. Still has balance issues 03/05/2018- vision improved, balance improved , back on walker since hospitaization,     Family History  Problem Relation Age of Onset   Heart disease Mother    Heart attack Mother    Hypertension Mother    Kidney disease Mother    Pneumonia Father    Heart attack Father    Hyperlipidemia Father    Diabetes  Sister    Breast cancer Sister    Arthritis Brother    Diabetes Brother    Heart attack Sister    Obesity Sister    Hypertension Sister    Heart attack Sister    Cancer Sister        breast   Hyperlipidemia Sister    Hypertension Sister    Hyperlipidemia Sister    Breast cancer Maternal Grandmother     Social History   Socioeconomic History   Marital status: Divorced    Spouse name: Not on file   Number of children: 2   Years of education: 12   Highest education level: Not on file  Occupational History   Occupation: DISABILITY    Employer: OTHER    Comment: Worked at  [employer] resource strain: Not on file   Food insecurity:    Worry: Not on file    Inability: Not on file   Transportation needs:    Medical: Not on file    Non-medical: Not on file  Tobacco Use   Smoking status: Never Smoker   Smokeless tobacco: Never Used  Substance and Sexual Activity   Alcohol use: No   Drug use: No   Sexual activity: Not Currently  Lifestyle   Physical activity:    Days per week: Not on file    Minutes per session: Not on file   Stress: Not on file  Relationships   Social connections:    Talks on phone: Not on file    Gets together: Not on file    Attends religious service: Not on file    Active member of club or organization: Not on file    Attends meetings of clubs or organizations: Not on file    Relationship status: Not on file   Intimate partner violence:    Fear of current or ex partner: Not on file    Emotionally abused: Not on file    Physically abused: Not on file    Forced sexual activity: Not on file  Other Topics Concern   Not on file  Social History Narrative   Health Care POA:    Emergency Contact: sister, [name] (h) [phone]   End of Life Plan:    Who lives with you: female friend   Any pets: none   Diet: Pt has a varied diet of protein, starch, vegetables   Exercise: Pt does not have any regular exercise routine.   Seatbelts: Pt reports wearing seatbelt when in vehicles.    Sun Exposure/Protection: Pt reports using sun screen.   Hobbies: going to lake, biking, blue grass music      Current Social History 08/16/2016        Patient lives with [name] (Significant other) in one level home 08/16/2016   Transportation: Patient has own vehicle 08/16/2016   Important Relationships [name] and sisters, [name] and Vaughan Basta 08/16/2016    Pets: None 08/16/2016   Education / Work:  12 th grade/ Disabled ([employer]) 08/16/2016   Interests / Fun: Going to [facility], out to eat with friends  08/16/2016   Current Stressors: Bilat foot pain (neuropathy) 08/16/2016   Religious / Personal Beliefs: Raised [employer], goes to [employer] 08/16/2016   L. Silvano Rusk, RN, BSN  Past Surgical History:  Procedure Laterality Date   A/V FISTULAGRAM Left 12/25/2017   Procedure: A/V FISTULAGRAM;  Surgeon: Waynetta Sandy, MD;  Location: [facility];  Service: Cardiovascular;  Laterality: Left;   A/V SHUNT INTERVENTION Left 12/25/2017   Procedure: A/V SHUNT INTERVENTION;  Surgeon: Waynetta Sandy, MD;  Location: [facility];  Service: Cardiovascular;  Laterality: Left;   ANGIOPLASTY / STENTING FEMORAL Right 09/2010   SFA/notes 11/25/2010 (01/22/2013)   ANGIOPLASTY / STENTING FEMORAL Left 11/2010   SFA/notes 11/25/2010 (01/22/2013)   ANGIOPLASTY / STENTING ILIAC     [name] 11/25/2010 (01/22/2013)   AV FISTULA PLACEMENT Left 03/07/2018   Procedure: CONVERSION TO  ARTERIOVENOUS Artegraft GRAFT ARM;  Surgeon: Waynetta Sandy, MD;  Location: [facility];  Service: Vascular;  Laterality: Left;   [place] Left 08/23/2017   Procedure: FIRST STAGE BASILIC VEIN TRANSPOSITION LEFT UPPER EXTREMITY;  Surgeon: [name], MD;  Location: [facility];  Service: Vascular;  Laterality: Left;   [place] Left 10/23/2017   Procedure: SECOND STAGE BASILIC VEIN TRANSPOSITION LEFT ARM;  Surgeon: [name], MD;  Location: [facility];  Service: Vascular;  Laterality: Left;   CATARACT EXTRACTION W/ INTRAOCULAR LENS  IMPLANT, BILATERAL Bilateral ?2011   CHOLECYSTECTOMY  1993   CORONARY ARTERY BYPASS GRAFT  10/1994   "CABG X3"   EP IMPLANTABLE DEVICE N/A 09/11/2015   Procedure: Loop Recorder Insertion;  Surgeon: Thompson Grayer, MD;  Location: [facility];  Service: Cardiovascular;  Laterality: N/A;   HEEL SPUR EXCISION Bilateral  1970's   IR FLUORO GUIDE CV LINE RIGHT  11/01/2017   IR FLUORO GUIDE CV LINE RIGHT  11/05/2017   IR US GUIDE VASC ACCESS RIGHT  11/01/2017   IR US GUIDE VASC ACCESS RIGHT  11/05/2017   LOWER EXTREMITY ANGIOGRAM N/A 01/22/2013   Procedure: LOWER EXTREMITY ANGIOGRAM;  Surgeon: [name], MD;  Location[facility] C[facility] LAB;  Service: Cardiovascular;  Laterality: N/A;   SHOULDER OPEN ROTATOR CUFF REPAIR Bilateral 1990's   "2 times on 1 side; once on the other"   TRANSLUMINAL ATHERECTOMY FEMORAL ARTERY Right 01/22/2013   & balloon   TUBAL LIGATION  1980's   VIDEO ASSISTED THORACOSCOPY (VATS)/EMPYEMA Left 11/29/2016   Procedure: VIDEO ASSISTED THORACOSCOPY (VATS)/EMPYEMA;  Surgeon: [name], MD;  Location: [facility] OR;  Service: Thoracic;  Laterality: Left;  VIDEO ASSISTED THORACOSCOPY (VATS)/EMPYEMA      allopurinol  50 mg Oral Daily   calcitRIOL  0.25 mcg Oral Q M,W,F-HD   Chlorhexidine Gluconate Cloth  6 each Topical Daily   Chlorhexidine Gluconate Cloth  6 each Topical Q0600   docusate sodium  100 mg Oral BID   ferric citrate  420 mg Oral TID WC   insulin aspart  0-9 Units Subcutaneous Q4H   insulin glargine  6 Units Subcutaneous Daily   [START ON 06/11/2018] metoprolol succinate  25 mg Oral Daily   mupirocin ointment  1 application Nasal BID   pantoprazole  40 mg Oral QHS   PARoxetine  40 mg Oral Daily   polyethylene glycol  17 g Oral Daily   rosuvastatin  20 mg Oral Daily    sodium chloride     diltiazem (CARDIZEM) infusion 5 mg/hr (06/10/18 1223)    Physical Exam: Blood pressure 110/64, pulse (!) 102, temperature 98.2 F (36.8 C), temperature source Oral, resp. rate 18, height 5\' 5"  (1.651 m), weight 75.3 kg, SpO2 97 %.   Chronically  ill female No tachypnea Lungs clear SEM Abdomen benign Foley catheter No edema Fistula LUE with decreased pulse left hand   Labs:   Lab Results  Component Value Date   WBC 14.2 (H) 06/10/2018   HGB 11.2 (L)  06/10/2018   HCT 34.7 (L) 06/10/2018   MCV 95.9 06/10/2018   PLT 165 06/10/2018    Recent Labs  Lab 06/07/18 0353  06/10/18 0350  NA 140   < > 136  K 5.0   < > 5.1  CL 95*   < > 94*  CO2 29   < > 22  BUN 27*   < > 54*  CREATININE 5.35*   < > 6.66*  CALCIUM 9.3   < > 8.9  PROT 5.4*  --   --   BILITOT 0.9  --   --   ALKPHOS 150*  --   --   ALT 64*  --   --   AST 49*  --   --   GLUCOSE 137*   < > 134*   < > = values in this interval not displayed.   Lab Results  Component Value Date   CKTOTAL 3,450 [facility]) 12/20/2016   CKMB 2.7 01/07/2009   TROPONINI 0.81 (HH) 06/10/2018    Lab Results  Component Value Date   CHOL 137 06/07/2018   CHOL 169 09/04/2015   CHOL 150 12/29/2014   Lab Results  Component Value Date   HDL 58 06/07/2018   HDL 32 (L) 09/04/2015   HDL 37 (L) 12/29/2014   Lab Results  Component Value Date   LDLCALC 69 06/07/2018   LDLCALC 99 09/04/2015   LDLCALC 83 12/29/2014   Lab Results  Component Value Date   TRIG 52 06/07/2018   TRIG 188 (H) 09/04/2015   TRIG 152 (H) 12/29/2014   Lab Results  Component Value Date   CHOLHDL 2.4 06/07/2018   CHOLHDL 5.3 09/04/2015   CHOLHDL 4.1 12/29/2014   No results found for: LDLDIRECT    Radiology: Dg Chest 1 View  Result Date: 06/06/2018 CLINICAL DATA:  Fall EXAM: CHEST  1 VIEW COMPARISON:  01/10/2018 FINDINGS: Moderate cardiomegaly. Remote median sternotomy and CABG. No focal airspace consolidation or pulmonary edema. No pleural effusion or pneumothorax. IMPRESSION: Cardiomegaly without focal airspace disease. Electronically Signed   By: Ulyses Jarred M.D.   On: 06/06/2018 22:25   Ct Angio Chest Pe W Or Wo Contrast  Result Date: 06/09/2018 CLINICAL DATA:  73 year old who underwent ORIF of a RIGHT femoral neck fracture yesterday, now presenting with acute tachycardia and shortness of breath. EXAM: CT ANGIOGRAPHY CHEST WITH CONTRAST TECHNIQUE: Multidetector CT imaging of the chest was performed using the  standard protocol during bolus administration of intravenous contrast. Multiplanar CT image reconstructions and MIPs were obtained to evaluate the vascular anatomy. CONTRAST:  20mL OMNIPAQUE IOHEXOL 350 MG/ML IV. COMPARISON:  CT chest 10/27/2017. FINDINGS: Cardiovascular: Contrast opacification of the pulmonary arteries is excellent. No filling defects within either main pulmonary artery or their segmental branches in either lung to suggest pulmonary embolism. Heart moderately enlarged with RIGHT atrial and RIGHT ventricular enlargement in particular. Mitral and aortic annular calcification. Extensive native three-vessel coronary atherosclerosis with prior CABG. Moderate atherosclerosis involving the thoracic and proximal abdominal aorta without evidence of aneurysm. Aberrant RIGHT subclavian artery which arises from the distal aortic arch and courses POSTERIOR to the esophagus. Mediastinum/Nodes: No pathologically enlarged mediastinal, hilar or axillary lymph nodes. No mediastinal masses. Normal-appearing esophagus. Multiple thyroid nodules as  noted on the prior CT. Lungs/Pleura: Small BILATERAL pleural effusions, RIGHT greater than LEFT. Associated atelectasis in the lower lobes. No confluent or ground-glass airspace consolidation in either lung. No parenchymal nodules or masses. No evidence of interstitial edema. Central airways patent without significant bronchial wall thickening. Upper Abdomen: No acute findings. Musculoskeletal: Degenerative changes and DISH involving the thoracic spine. Degenerative disc disease and spondylosis involving the lower cervical spine. No acute findings. Review of the MIP images confirms the above findings. IMPRESSION: 1. No evidence of pulmonary embolism. 2. Small bilateral pleural effusions, right greater than left, with associated mild passive atelectasis in the lower lobes. No acute cardiopulmonary disease otherwise. 3. Moderate cardiomegaly with right atrial and right  ventricular enlargement in particular. 4. Aberrant right subclavian artery. Aortic Atherosclerosis (ICD10-I70.0). Electronically Signed   By: Evangeline Dakin M.D.   On: 06/09/2018 15:19   Dg Chest Port 1 View  Result Date: 06/09/2018 CLINICAL DATA:  Shortness of breath. EXAM: PORTABLE CHEST 1 VIEW COMPARISON:  Jun 08, 2018 FINDINGS: Stable cardiomegaly. The heart, hila, mediastinum, lungs, and pleura are otherwise normal. IMPRESSION: No active disease. Electronically Signed   By: Dorise Bullion III M.D   On: 06/09/2018 10:12   Dg Chest Port 1 View  Result Date: 06/08/2018 CLINICAL DATA:  Shortness of breath. EXAM: PORTABLE CHEST 1 VIEW COMPARISON:  Radiograph of Jun 06, 2018. FINDINGS: Stable cardiomegaly. Status post coronary artery bypass graft. Atherosclerosis of thoracic aorta is noted. No pneumothorax or pleural effusion is noted. No acute pulmonary disease is noted. Bony thorax is unremarkable. IMPRESSION: No acute cardiopulmonary abnormality seen. Electronically Signed   By: Marijo Conception M.D.   On: 06/08/2018 14:40   Dg Abd Portable 2v  Result Date: 06/10/2018 CLINICAL DATA:  Vomiting today. EXAM: PORTABLE ABDOMEN - 2 VIEW COMPARISON:  Chest x-ray 06/09/2018 FINDINGS: The stomach is moderately dilated. There is mild gaseous distention of multiple loops of small bowel. Gas is seen into the distal colon. Fluid levels are present on the decubitus image. There is no free air. IMPRESSION: 1. Gaseous distension of small bowel and colon with fluid levels suggesting ileus. No discrete obstruction is present. 2. The stomach is distended as well. 3. No free air. Electronically Signed   By: San Morelle M.D.   On: 06/10/2018 11:45   Dg C-arm 1-60 Min  Result Date: 06/08/2018 CLINICAL DATA:  Right femur fracture. EXAM: RIGHT FEMUR 2 VIEWS; DG C-ARM 61-120 MIN COMPARISON:  06/06/2018 FINDINGS: Placement of an intramedullary nail in the right femur with screws extending into the right femoral  head and neck. Again noted is the femoral intertrochanteric fracture. The distal aspect of the intramedullary nail is visualized with an interlocking screw. Patient has a vascular stent in the mid thigh. Right hip appears to be located based on these images. IMPRESSION: Internal fixation of the right femur fracture. Electronically Signed   By: Markus Daft M.D.   On: 06/08/2018 16:28   Vas [place] Upper Extremity Arterial Duplex  Result Date: 06/10/2018 UPPER EXTREMITY DUPLEX STUDY Indications: Cold extremity. LUE AVG.  Performing Technologist: Maudry Mayhew MHA, RDMS, RVT, RDCS  Examination Guidelines: A complete evaluation includes B-mode imaging, spectral Doppler, color Doppler, and power Doppler as needed of all accessible portions of each vessel. Bilateral testing is considered an integral part of a complete examination. Limited examinations for reoccurring indications may be performed as noted.  Left Doppler Findings: +-----------+----------+-------------------+------+--------+  Site        PSV (cm/s) Waveform  Plaque Comments  +-----------+----------+-------------------+------+--------+  Subclavian  129        monophasic                           +-----------+----------+-------------------+------+--------+  Axillary    224        monophasic                           +-----------+----------+-------------------+------+--------+  Brachial    323        monophasic                           +-----------+----------+-------------------+------+--------+  Radial      59         dampened monophasic                  +-----------+----------+-------------------+------+--------+  Palmar Arch 18         dampened monophasic                  +-----------+----------+-------------------+------+--------+ AVG anastomosis is at distal upper arm.  Summary:  Left: Arterial flow is patent through to the hand, however there is       significantly reduced flow distal to the anastomosis at the       distal upper arm. *See  table(s) above for measurements and observations. Electronically signed by Ruta Hinds MD on 06/10/2018 at 9:59:45 AM.    Final    Dg Hip Unilat With Pelvis 2-3 Views Right  Result Date: 06/06/2018 CLINICAL DATA:  Fall EXAM: DG HIP (WITH OR WITHOUT PELVIS) 2-3V RIGHT COMPARISON:  None. FINDINGS: Medially angulated fracture at the base of the right femoral neck femoral head remains positioned in the acetabulum. No pelvic fracture. IMPRESSION: Medially angulated fracture of the base of the right femoral neck. Electronically Signed   By: Ulyses Jarred M.D.   On: 06/06/2018 22:21   Dg Femur, Min 2 Views Right  Result Date: 06/08/2018 CLINICAL DATA:  Right femur fracture. EXAM: RIGHT FEMUR 2 VIEWS; DG C-ARM 61-120 MIN COMPARISON:  06/06/2018 FINDINGS: Placement of an intramedullary nail in the right femur with screws extending into the right femoral head and neck. Again noted is the femoral intertrochanteric fracture. The distal aspect of the intramedullary nail is visualized with an interlocking screw. Patient has a vascular stent in the mid thigh. Right hip appears to be located based on these images. IMPRESSION: Internal fixation of the right femur fracture. Electronically Signed   By: Markus Daft M.D.   On: 06/08/2018 16:28    EKG: fib/flutter non specific ST changes   Afib:  Chronic Resume eliquis when ok with surgery Continue cardizem drip Add oral lopressor She is asymptomatic and rates up due to hip fracute, pain and anemia  CAD:  Post distant CABG toponin 0.88 not significant in setting of CRF no acute ECG changes and no chest pain   CRF:  Needs revision of LUE fistula for steel. Dialysis Monday  HLD:  On statin   ASSESSMENT AND PLAN:    Signed: Jenkins Rouge 06/10/2018, 12:46 PM

## 2018-06-10 NOTE — Progress Notes (Signed)
Patient admitted to unit vital, signs stable, called CCMD, verified orders, CHG bath completed, oriented to unt and room, call bell within reach, and bed in lowest position.

## 2018-06-10 NOTE — Progress Notes (Signed)
[name], sister of patient called unit,  Provider update to family of patient's condition, the patient's sister also requested that the provider provide an update to her by phone.  Notified Dr. Sandi Carne, DO who agreed to call and update the family.

## 2018-06-10 NOTE — Progress Notes (Signed)
[facility] for Heparin- stopped Indication: atrial fibrillation, PTA Apixaban held for surgery  Allergies  Allergen Reactions  . Cefepime Anaphylaxis    Throat swelling  . Amoxicillin Rash    Did it involve swelling of the face/tongue/throat, SOB, or low BP? No Did it involve sudden or severe rash/hives, skin peeling, or any reaction on the inside of your mouth or nose? No Did you need to seek medical attention at a hospital or doctor's office? No When did it last happen?10+ years If all above answers are "NO", may proceed with cephalosporin use.    . Codeine Phosphate Rash  . Penicillins Rash    Did it involve swelling of the face/tongue/throat, SOB, or low BP? No Did it involve sudden or severe rash/hives, skin peeling, or any reaction on the inside of your mouth or nose? No Did you need to seek medical attention at a hospital or doctor's office? No When did it last happen?10+ years If all above answers are "NO", may proceed with cephalosporin use.     Marland Kitchen Propoxyphene Rash   Patient Measurements: Height: 5\' 5"  (165.1 cm) Weight: 166 lb 0.1 oz (75.3 kg) IBW/kg (Calculated) : 57 Heparin Dosing Weight: 69.1 kg  Vital Signs: Temp: 98.4 F (36.9 C) (05/17 0338) Temp Source: Oral (05/17 0338) BP: 111/78 (05/17 0338) Pulse Rate: 108 (05/17 0338)  Labs: Recent Labs    06/08/18 0801 06/08/18 0802 06/09/18 0430 06/09/18 4431 06/09/18 0915 06/09/18 1837 06/10/18 0350  HGB 12.2  --  11.8*  --   --   --  11.2*  HCT 38.7  --  38.6  --   --   --  34.7*  PLT 151  --  133*  --   --   --  165  APTT  --   --   --   --  29  --   --   LABPROT  --   --   --  15.2  --   --   --   INR  --   --   --  1.2  --   --   --   HEPARINUNFRC  --   --   --   --  0.34 0.88* 0.49  CREATININE  --  7.34* 5.07*  --   --   --  6.66*   Estimated Creatinine Clearance: 7.6 mL/min (A) (by C-G formula based on SCr of 6.66 mg/dL (H)).  Medical  History: Past Medical History:  Diagnosis Date  . Anemia 08/29/2011  . Anxiety   . Arthritis    "legs" (01/22/2013)  . CAD (coronary artery disease)    s/p CABG in 1999  . Cataract   . Chronic kidney disease    Sees Dr [name]  . Chronic lymphocytic leukemia ([place]) 10/13/2006   Qualifier: Diagnosis of  By: Andria Frames MD, Gwyndolyn Saxon    . 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 Diastolic heart failure ([place])   . Dysrhythmia    PAF  . Enteritis due to Clostridium difficile 07/02/2017  . GERD (gastroesophageal reflux disease)   . Gout   . H/O hiatal hernia   . Headache    "years ago migraines, none in along time."  . Heart murmur   . Hyperlipidemia   . Hypertension   . IDDM (insulin dependent diabetes mellitus) ([place])    Type II  . Myocardial infarction ([place])    before 1995  . PAD (  peripheral artery disease) ([place])   . Paroxysmal atrial fibrillation ([facility])    a. identified on ILR as part of StrokeAF study  . Peripheral vascular disease ([place])   . Pneumonia 11/2016  . Stress incontinence, female 11/11/2010  . 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 Still has balance issues 03/05/2018- vision improved, balance improved , back on walker since hospitaization,    Medications:  Scheduled:  . allopurinol  50 mg Oral Daily  . apixaban  5 mg Oral BID  . calcitRIOL  0.25 mcg Oral Q M,W,F-HD  . Chlorhexidine Gluconate Cloth  6 each Topical Daily  . docusate sodium  100 mg Oral BID  . ferric citrate  420 mg Oral TID WC  . insulin aspart  0-9 Units Subcutaneous Q4H  . insulin glargine  6 Units Subcutaneous Daily  . metoprolol succinate  25 mg Oral Daily  . mupirocin ointment  1 application Nasal BID  . pantoprazole  40 mg Oral QHS  . PARoxetine  40 mg Oral Daily  . rosuvastatin  20 mg Oral Daily   Infusions:  . sodium chloride    . heparin 750 Units/hr (06/09/18 1958)   Assessment: 10 yoF with ESRD on HD and afib on apixaban  presents after a fall and hip fx.  5/15 IM nail hip Fx. Home Apixaban 5mg  bid, last dose 5/13 at 0500  - Apixaban artificially elevates Heparin levels, will order baseline aPTT and Heparin level.  Anticipate Heparin level will be low with last dose Apixaban 3 days ago   First Hep level on 1000 units/hr = 0.88 units/ml, rate decreased to 750 units/hr  Today, 06/10/2018 0350 HL = 0.49 on 750 units/hr Hgb 11.2, stable, Plt 165 Hematemesis noted, stop Heparin  Goal of Therapy:  Heparin level 0.3-0.7 units/ml aPTT 66-102 seconds Monitor platelets by anticoagulation protocol: Yes   Plan:  Stopped Heparin for possible bleed FOBT, assess ability to resume Apixaban Daily CBC ordered x 3 days (last 5/18)    [name]  [phone] till 3:30p [phone] afterward 06/10/2018,8:49 AM

## 2018-06-10 NOTE — Progress Notes (Signed)
Entered  pt's room to do assessment and pt stated she was having chest pain and stated it felt like and elephant was sitting on her on chest. Pt also complained of her chest  burning. Pt is diaphoretic and states her stomach is hurting. Abdomen is not distended, hernia noted. Pt had 2 episodes of vomiting that was brown. Pt states sPt states LBM was PTA. Pt also complaining of sharp shooting pain that is sudden. Pt's HR now ranging from 160-200. MD on call called. EKG done and will send occult for gastroenterology for emesis.Will continue to monitor pt.

## 2018-06-10 NOTE — Progress Notes (Signed)
Patient's KUB suggestive of post-op ileus.  Trops mildly elevated, likely 2/2 ESRD.  Patient's vomiting has improved at this time. - transition meds to IV from PO - cards started metoprolol 25 mq PO q8h, will transition to 5mg  IV q8h - cont dilt gtt - NPO, hold off on NG tube unless vomiting restarts and is frequent - pain control: IV tylenol q6h sch, IV morphine 1mg  q2h prn severe pain - encourage patient to sit up and move as able - gentle fluids at 75 cc/hr - trend trops - cont cardiac monitoring - restart Eliquis pending negative occult blood

## 2018-06-10 NOTE — Progress Notes (Signed)
CRITICAL VALUE ALERT  Critical Value:  0.81 troponin  Date & Time Notied:  06/10/18 1253  Provider Notified: Dr. [place] Constable, DO 06/10/18 1256  Orders Received/Actions taken: Provider made aware.

## 2018-06-11 ENCOUNTER — Encounter (HOSPITAL_COMMUNITY): Payer: Self-pay | Admitting: Orthopaedic Surgery

## 2018-06-11 DIAGNOSIS — I480 Paroxysmal atrial fibrillation: Secondary | ICD-10-CM

## 2018-06-11 DIAGNOSIS — I2510 Atherosclerotic heart disease of native coronary artery without angina pectoris: Secondary | ICD-10-CM

## 2018-06-11 DIAGNOSIS — R5381 Other malaise: Secondary | ICD-10-CM

## 2018-06-11 DIAGNOSIS — N186 End stage renal disease: Secondary | ICD-10-CM

## 2018-06-11 DIAGNOSIS — I10 Essential (primary) hypertension: Secondary | ICD-10-CM

## 2018-06-11 DIAGNOSIS — K567 Ileus, unspecified: Secondary | ICD-10-CM

## 2018-06-11 DIAGNOSIS — E118 Type 2 diabetes mellitus with unspecified complications: Secondary | ICD-10-CM

## 2018-06-11 DIAGNOSIS — Z8673 Personal history of transient ischemic attack (TIA), and cerebral infarction without residual deficits: Secondary | ICD-10-CM

## 2018-06-11 DIAGNOSIS — Z992 Dependence on renal dialysis: Secondary | ICD-10-CM

## 2018-06-11 LAB — CBC
HCT: 32.9 % — ABNORMAL LOW (ref 36.0–46.0)
Hemoglobin: 10.5 g/dL — ABNORMAL LOW (ref 12.0–15.0)
MCH: 30.8 pg (ref 26.0–34.0)
MCHC: 31.9 g/dL (ref 30.0–36.0)
MCV: 96.5 fL (ref 80.0–100.0)
Platelets: 147 10*3/uL — ABNORMAL LOW (ref 150–400)
RBC: 3.41 MIL/uL — ABNORMAL LOW (ref 3.87–5.11)
RDW: 19.7 % — ABNORMAL HIGH (ref 11.5–15.5)
WBC: 13.1 10*3/uL — ABNORMAL HIGH (ref 4.0–10.5)
nRBC: 0.2 % (ref 0.0–0.2)

## 2018-06-11 LAB — RENAL FUNCTION PANEL
Albumin: 2.4 g/dL — ABNORMAL LOW (ref 3.5–5.0)
Anion gap: 18 — ABNORMAL HIGH (ref 5–15)
BUN: 70 mg/dL — ABNORMAL HIGH (ref 8–23)
CO2: 21 mmol/L — ABNORMAL LOW (ref 22–32)
Calcium: 8.4 mg/dL — ABNORMAL LOW (ref 8.9–10.3)
Chloride: 94 mmol/L — ABNORMAL LOW (ref 98–111)
Creatinine, Ser: 7.62 mg/dL — ABNORMAL HIGH (ref 0.44–1.00)
GFR calc Af Amer: 6 mL/min — ABNORMAL LOW (ref >60)
GFR calc non Af Amer: 5 mL/min — ABNORMAL LOW (ref >60)
Glucose, Bld: 116 mg/dL — ABNORMAL HIGH (ref 70–99)
Phosphorus: 8.3 mg/dL — ABNORMAL HIGH (ref 2.5–4.6)
Potassium: 5.4 mmol/L — ABNORMAL HIGH (ref 3.5–5.1)
Sodium: 133 mmol/L — ABNORMAL LOW (ref 135–145)

## 2018-06-11 LAB — TROPONIN I: Troponin I: 0.48 ng/mL (ref <0.03)

## 2018-06-11 LAB — GLUCOSE, CAPILLARY
Glucose-Capillary: 115 mg/dL — ABNORMAL HIGH (ref 70–99)
Glucose-Capillary: 116 mg/dL — ABNORMAL HIGH (ref 70–99)
Glucose-Capillary: 64 mg/dL — ABNORMAL LOW (ref 70–99)
Glucose-Capillary: 93 mg/dL (ref 70–99)

## 2018-06-11 LAB — HEPARIN LEVEL (UNFRACTIONATED): Heparin Unfractionated: 0.4 IU/mL (ref 0.30–0.70)

## 2018-06-11 MED ORDER — APIXABAN 5 MG PO TABS
5.0000 mg | ORAL_TABLET | Freq: Two times a day (BID) | ORAL | Status: DC
  Administered 2018-06-11 – 2018-06-14 (×7): 5 mg via ORAL
  Filled 2018-06-11 (×7): qty 1

## 2018-06-11 MED ORDER — DEXTROSE 50 % IV SOLN
INTRAVENOUS | Status: AC
  Administered 2018-06-11: 25 mL
  Filled 2018-06-11: qty 50

## 2018-06-11 MED ORDER — ACETAMINOPHEN 325 MG PO TABS
650.0000 mg | ORAL_TABLET | Freq: Four times a day (QID) | ORAL | Status: DC
  Administered 2018-06-11 – 2018-06-14 (×12): 650 mg via ORAL
  Filled 2018-06-11 (×12): qty 2

## 2018-06-11 MED ORDER — CALCITRIOL 0.25 MCG PO CAPS
ORAL_CAPSULE | ORAL | Status: AC
  Administered 2018-06-11: 0.25 ug via ORAL
  Filled 2018-06-11: qty 1

## 2018-06-11 MED ORDER — METOPROLOL SUCCINATE ER 25 MG PO TB24
25.0000 mg | ORAL_TABLET | Freq: Every day | ORAL | Status: DC
  Administered 2018-06-11 – 2018-06-14 (×4): 25 mg via ORAL
  Filled 2018-06-11 (×5): qty 1

## 2018-06-11 MED ORDER — OXYCODONE HCL 5 MG PO TABS
2.5000 mg | ORAL_TABLET | Freq: Four times a day (QID) | ORAL | Status: DC | PRN
  Administered 2018-06-12 – 2018-06-14 (×7): 2.5 mg via ORAL
  Filled 2018-06-11 (×6): qty 1

## 2018-06-11 NOTE — TOC Progression Note (Signed)
Transition of Care [facility]) - Progression Note    Patient Details  Name: Christine Santos MRN: [id number] Date of Birth: [birth year]  [facility]) CM/SW [place], [place] Phone Number: 06/11/2018, 8:49 AM  Clinical Narrative:     CSW acknowledges consult for SNF placement. Will follow for therapy recommendations.   Thurmond Butts, MSW, LCSWA Clinical Social Worker [phone]         Barriers to Discharge: Continued Medical Work up  Expected Discharge Plan and Services     Discharge Planning Services: CM Consult                                           Social Determinants of Health (SDOH) Interventions    Readmission Risk Interventions No flowsheet data found.

## 2018-06-11 NOTE — Evaluation (Addendum)
Occupational Therapy Evaluation Patient Details Name: Christine Santos MRN: [id number] DOB: [birth year] Today's Date: 06/11/2018    History of Present Illness Pt is a 73 y.o. female presenting with right hip fracture after a fall at home Now s/p IM nail RLE. PMH is significant for atrial fibrillation on Eliquis, chronic anemia, ESRD on HD, T2DM on insulin, HTN, HLD, GERD, CHF, gout.   Clinical Impression   Pt's PLOF was Mod I with DME - Rollator/cane for ambulation and shower chair for bathing. Today Pt presents with pain.  Pt with hx of falls due to being unsteady. Today, pt requires Mod A for bed mobility (+2 helpful) and transfers. Initially, pt with dizziness sitting EOB with drop in BP, which resolved with exercise and time. Difficult to get Sp02 reading but read ~80% on RA during session so donned 2L/min 02. Able to participate in seated grooming tasks seated EOB after initially requiring mod A for seated balance with posterior lean (which improved to min guard) Tolerated taking a few pivotal steps to get to chair with+ 2 to manage lines. Pt will benefit from skilled OT in the acute setting as well as afterwards at the SNF level to maximize safety and independence in ADL and functional transfers. Next session to focus on LB ADL - introduce and initiate education for AE.     Follow Up Recommendations  SNF;Supervision/Assistance - 24 hour    Equipment Recommendations  None recommended by OT    Recommendations for Other Services       Precautions / Restrictions Precautions Precautions: Fall Precaution Comments: watch 02 Restrictions Weight Bearing Restrictions: Yes RLE Weight Bearing: Weight bearing as tolerated      Mobility Bed Mobility Overal bed mobility: Needs Assistance Bed Mobility: Rolling;Sidelying to Sit Rolling: Mod assist Sidelying to sit: Mod assist;+2 for physical assistance;HOB elevated       General bed mobility comments: Step by step cues for technique, to  reach for rail; assist with RLE, to elevate trunk to upright and scoot bottom towards EOB.  + dizziness.   Transfers Overall transfer level: Needs assistance Equipment used: Rolling walker (2 wheeled) Transfers: Sit to/from Stand Sit to Stand: Mod assist;From elevated surface         General transfer comment: Assist to power to standing with cues for hand/foot placement. Pt tends to pull up with both hands on walker Stood from EOB x2.  Initial sit<>stand was mod +2, second was mod +1    Balance Overall balance assessment: Needs assistance Sitting-balance support: Feet supported;Bilateral upper extremity supported Sitting balance-Leahy Scale: Poor Sitting balance - Comments: Requires BUE support in sitting or external support esp initially when dizzy. Postural control: Posterior lean Standing balance support: During functional activity;Bilateral upper extremity supported Standing balance-Leahy Scale: Poor Standing balance comment: Requires BUE support in standing.                           ADL either performed or assessed with clinical judgement   ADL Overall ADL's : Needs assistance/impaired Eating/Feeding: NPO   Grooming: Wash/dry face;Set up;Sitting Grooming Details (indicate cue type and reason): in recliner Upper Body Bathing: Minimal assistance;Sitting   Lower Body Bathing: Maximal assistance;Sitting/lateral leans   Upper Body Dressing : Set up;Sitting   Lower Body Dressing: Maximal assistance;Sit to/from stand   Toilet Transfer: Moderate assistance;+2 for safety/equipment Toilet Transfer Details (indicate cue type and reason): able to stand pivot with RW (pulls up with both hands on  walker) +2 for safety and line management Toileting- Clothing Manipulation and Hygiene: Moderate assistance;+2 for safety/equipment;Sit to/from stand       Functional mobility during ADLs: Moderate assistance;+2 for safety/equipment;Rolling walker;Cueing for sequencing;Cueing  for safety General ADL Comments: decreased access to LB for ADL due to pain, decreased activity tolerance, decreased balance     Vision Baseline Vision/History: Wears glasses Patient Visual Report: No change from baseline       Perception     Praxis      Pertinent Vitals/Pain Pain Assessment: 0-10 Pain Score: 4  Pain Location: right hip Pain Descriptors / Indicators: Sore;Operative site guarding Pain Intervention(s): Monitored during session;Repositioned     Hand Dominance Right   Extremity/Trunk Assessment Upper Extremity Assessment Upper Extremity Assessment: LUE deficits/detail;Generalized weakness LUE Deficits / Details: decreased sensation, increased time required for motor LUE Sensation: decreased light touch LUE Coordination: decreased fine motor   Lower Extremity Assessment Lower Extremity Assessment: RLE deficits/detail RLE Deficits / Details: Limited AROM secondary to pain and post op.  RLE Sensation: decreased light touch   Cervical / Trunk Assessment Cervical / Trunk Assessment: Kyphotic   Communication Communication Communication: No difficulties   Cognition Arousal/Alertness: Awake/alert Behavior During Therapy: WFL for tasks assessed/performed Overall Cognitive Status: No family/caregiver present to determine baseline cognitive functioning                                 General Comments: WFL for basic mobility tasks however pt changing answers to PLOF questions during session- uses RW, but then only SPC. Drives but then takes the bus etc. Able to follow 2 step commands. A&Ox3.   General Comments  Supine BP 127/52, sitting BP 100/40, sitting ~3 mins BP 121/53. Pt dizzy initially which resolved with time and exercise.    Exercises General Exercises - Lower Extremity Long Arc Quad: Left;10 reps;Seated   Shoulder Instructions      Home Living Family/patient expects to be discharged to:: Private residence Living Arrangements:  Non-relatives/Friends(with [employer] People) Available Help at Discharge: Friend(s);Available 24 hours/day Type of Home: House Home Access: Stairs to enter [employer] of Steps: 2 Entrance Stairs-Rails: Left Home Layout: One level     Bathroom Shower/Tub: Tub/shower unit;Curtain   Bathroom Toilet: Handicapped height     Home Equipment: Environmental consultant - 2 wheels;Cane - quad;Cane - single point;Shower seat;Grab bars - Dispensing optician - 4 wheels;Bedside commode   Additional Comments: Lives with her friend Abe People who is visually impaired.      Prior Functioning/Environment Level of Independence: Independent with assistive device(s)        Comments: Uses rollator for ambulation; bus takes her to dialysis.        OT Problem List: Decreased range of motion;Decreased activity tolerance;Impaired balance (sitting and/or standing);Decreased knowledge of use of DME or AE;Decreased knowledge of precautions;Impaired sensation;Impaired UE functional use;Pain      OT Treatment/Interventions: Self-care/ADL training;DME and/or AE instruction;Therapeutic activities;Patient/family education;Balance training    OT Goals(Current goals can be found in the care plan section) Acute Rehab OT Goals Patient Stated Goal: to get better, go to rehab and then home OT Goal Formulation: With patient/family Time For Goal Achievement: 06/25/18 Potential to Achieve Goals: Good ADL Goals Pt Will Perform Grooming: with min guard assist;standing Pt Will Perform Lower Body Bathing: with set-up;sit to/from stand;with adaptive equipment Pt Will Perform Lower Body Dressing: with min guard assist;with adaptive equipment;sit to/from stand Pt Will Transfer to Toilet: with  min assist;ambulating Pt Will Perform Toileting - Clothing Manipulation and hygiene: with min guard assist;sit to/from stand Additional ADL Goal #1: Pt will perform bed mobility prior to engaging in ADL at supervision level  OT Frequency:  Min 2X/week   Barriers to D/C:    spoke with [employer] People on the phone at Pt's request to let him know how she did and that Therapy is recommending SNF level rehab post-acute       Co-evaluation PT/OT/SLP Co-Evaluation/Treatment: Yes Reason for Co-Treatment: For patient/therapist safety;To address functional/ADL transfers PT goals addressed during session: Mobility/safety with mobility;Balance;Proper use of DME;Strengthening/ROM OT goals addressed during session: ADL's and self-care;Proper use of Adaptive equipment and DME      AM-PAC OT "6 Clicks" Daily Activity     Outcome Measure Help from another person eating meals?: Total(NPO) Help from another person taking care of personal grooming?: A Little Help from another person toileting, which includes using toliet, bedpan, or urinal?: A Lot Help from another person bathing (including washing, rinsing, drying)?: A Lot Help from another person to put on and taking off regular upper body clothing?: A Little Help from another person to put on and taking off regular lower body clothing?: A Lot 6 Click Score: 13   End of Session Equipment Utilized During Treatment: Rolling walker;Gait belt;Oxygen(2L) Nurse Communication: Mobility status;Precautions;Weight bearing status  Activity Tolerance: Patient tolerated treatment well Patient left: in chair;with call bell/phone within reach;with chair alarm set  OT Visit Diagnosis: Unsteadiness on feet (R26.81);Other abnormalities of gait and mobility (R26.89);History of falling (Z91.81);Pain;Dizziness and giddiness (R42) Pain - Right/Left: Right Pain - part of body: Hip                Time: 4650-3546 OT Time Calculation (min): 43 min Charges:  OT General Charges $OT Visit: 1 Visit OT Evaluation $OT Eval Moderate Complexity: [place] OTR/L Acute Rehabilitation Services Pager: [phone] Office: [place] 06/11/2018, 11:02 AM   Addendum: fixed time in and  out

## 2018-06-11 NOTE — Progress Notes (Signed)
ANTICOAGULATION CONSULT NOTE - Follow Up Consult  Pharmacy Consult for heparin Indication: atrial fibrillation  Labs: Recent Labs    06/09/18 0430 06/09/18 0619  06/09/18 0915 06/09/18 1837 06/10/18 0350 06/10/18 1147 06/10/18 1702 06/11/18 0006 06/11/18 0227  HGB 11.8*  --   --   --   --  11.2*  --   --   --  10.5*  HCT 38.6  --   --   --   --  34.7*  --   --   --  32.9*  PLT 133*  --   --   --   --  165  --   --   --  147*  APTT  --   --   --  29  --   --   --   --   --   --   LABPROT  --  15.2  --   --   --   --   --   --   --   --   INR  --  1.2  --   --   --   --   --   --   --   --   HEPARINUNFRC  --   --    < > 0.34 0.88* 0.49  --   --   --  0.40  CREATININE 5.07*  --   --   --   --  6.66*  --   --   --  7.62*  TROPONINI  --   --   --   --   --   --  0.81* 0.62* 0.48*  --    < > = values in this interval not displayed.    Assessment/Plan:  73yo female therapeutic on heparin after resumed. Will continue gtt at current rate and confirm stable with additional level.     Wynona Neat, PharmD, BCPS  06/11/2018,3:32 AM

## 2018-06-11 NOTE — TOC Progression Note (Signed)
Transition of Care [facility]) - Progression Note    Patient Details  Name: Christine Santos MRN: [id number] Date of Birth: 02-17-1945  [facility] CM/SW [place], [place] Phone Number: 06/11/2018, 4:16 PM  Clinical Narrative:     Patient not in her room- CSW unable to completed SNF assessment at this time.    Barriers to Discharge: Continued Medical Work up  Expected Discharge Plan and Services     Discharge Planning Services: CM Consult                                           Social Determinants of Health (SDOH) Interventions    Readmission Risk Interventions No flowsheet data found.

## 2018-06-11 NOTE — Progress Notes (Signed)
Progress Note  Patient Name: Christine Santos Date of Encounter: 06/11/2018  Primary Cardiologist: Larae Grooms, MD   Subjective   No CV complaints. PT evaluation suggests SNF. Excellent rate control.  Inpatient Medications    Scheduled Meds: . allopurinol  50 mg Oral Daily  . calcitRIOL  0.25 mcg Oral Q M,W,F-HD  . Chlorhexidine Gluconate Cloth  6 each Topical Daily  . Chlorhexidine Gluconate Cloth  6 each Topical Q0600  . docusate sodium  100 mg Oral BID  . ferric citrate  420 mg Oral TID WC  . insulin aspart  0-9 Units Subcutaneous Q4H  . insulin glargine  6 Units Subcutaneous Daily  . metoprolol tartrate  5 mg Intravenous Q8H  . mupirocin ointment  1 application Nasal BID  . pantoprazole (PROTONIX) IV  40 mg Intravenous QHS  . PARoxetine  40 mg Oral Daily  . polyethylene glycol  17 g Oral Daily  . rosuvastatin  20 mg Oral Daily   Continuous Infusions: . sodium chloride    . acetaminophen 1,000 mg (06/11/18 0043)  . diltiazem (CARDIZEM) infusion 5 mg/hr (06/11/18 0139)   PRN Meds: menthol-cetylpyridinium **OR** phenol, methocarbamol **OR** [DISCONTINUED] methocarbamol (ROBAXIN) IV, morphine injection, prochlorperazine, sorbitol   Vital Signs    Vitals:   06/10/18 1800 06/10/18 1954 06/11/18 0002 06/11/18 0426  BP: (!) 121/59 126/64 (!) 126/47 137/65  Pulse: 84 82 67 78  Resp: 19 16 16 19   Temp: 98.5 F (36.9 C) 98.3 F (36.8 C) 97.9 F (36.6 C) 98.4 F (36.9 C)  TempSrc: Oral Oral Oral Oral  SpO2: 100% 100% 98% 98%  Weight:    77.7 kg  Height:        Intake/Output Summary (Last 24 hours) at 06/11/2018 0914 Last data filed at 06/11/2018 0000 Gross per 24 hour  Intake 344.16 ml  Output 700 ml  Net -355.84 ml   Last 3 Weights 06/11/2018 06/10/2018 06/08/2018  Weight (lbs) 171 lb 4.8 oz 166 lb 0.1 oz 152 lb 5.4 oz  Weight (kg) 77.7 kg 75.3 kg 69.1 kg      Telemetry    Atrial fibrillation with HR 60-70s - Personally Reviewed  ECG    Atrial  fibrillation - Personally Reviewed  Physical Exam   GEN: No acute distress.   Neck: No JVD Cardiac: irregularly irregular, no murmurs, rubs, or gallops.  Respiratory: Clear to auscultation bilaterally. GI: Soft, nontender, non-distended  MS: No edema; No deformity. Neuro:  Nonfocal  Psych: Normal affect   Labs    Chemistry Recent Labs  Lab 06/07/18 0353 06/08/18 0802 06/09/18 0430 06/09/18 1837 06/10/18 0350 06/11/18 0227  NA 140 136 137  --  136 133*  K 5.0 6.2* 5.8* 5.5* 5.1 5.4*  CL 95* 94* 97*  --  94* 94*  CO2 29 26 20*  --  22 21*  GLUCOSE 137* 168* 174*  --  134* 116*  BUN 27* 46* 28*  --  54* 70*  CREATININE 5.35* 7.34* 5.07*  --  6.66* 7.62*  CALCIUM 9.3 8.9 8.8*  --  8.9 8.4*  PROT 5.4*  --   --   --   --   --   ALBUMIN 3.1* 2.9*  --   --  2.9* 2.4*  AST 49*  --   --   --   --   --   ALT 64*  --   --   --   --   --   ALKPHOS 150*  --   --   --   --   --  BILITOT 0.9  --   --   --   --   --   GFRNONAA 7* 5* 8*  --  6* 5*  GFRAA 9* 6* 9*  --  7* 6*  ANIONGAP 16* 16* 20*  --  20* 18*     Hematology Recent Labs  Lab 06/09/18 0430 06/10/18 0350 06/11/18 0227  WBC 17.6* 14.2* 13.1*  RBC 3.83* 3.62* 3.41*  HGB 11.8* 11.2* 10.5*  HCT 38.6 34.7* 32.9*  MCV 100.8* 95.9 96.5  MCH 30.8 30.9 30.8  MCHC 30.6 32.3 31.9  RDW 19.9* 19.7* 19.7*  PLT 133* 165 147*    Cardiac Enzymes Recent Labs  Lab 06/10/18 1147 06/10/18 1702 06/11/18 0006  TROPONINI 0.81* 0.62* 0.48*   No results for input(s): TROPIPOC in the last 168 hours.   BNPNo results for input(s): BNP, PROBNP in the last 168 hours.   DDimer No results for input(s): DDIMER in the last 168 hours.   Radiology    Ct Angio Chest Pe W Or Wo Contrast  Result Date: 06/09/2018 CLINICAL DATA:  73 year old who underwent ORIF of a RIGHT femoral neck fracture yesterday, now presenting with acute tachycardia and shortness of breath. EXAM: CT ANGIOGRAPHY CHEST WITH CONTRAST TECHNIQUE: Multidetector CT  imaging of the chest was performed using the standard protocol during bolus administration of intravenous contrast. Multiplanar CT image reconstructions and MIPs were obtained to evaluate the vascular anatomy. CONTRAST:  50mL OMNIPAQUE IOHEXOL 350 MG/ML IV. COMPARISON:  CT chest 10/27/2017. FINDINGS: Cardiovascular: Contrast opacification of the pulmonary arteries is excellent. No filling defects within either main pulmonary artery or their segmental branches in either lung to suggest pulmonary embolism. Heart moderately enlarged with RIGHT atrial and RIGHT ventricular enlargement in particular. Mitral and aortic annular calcification. Extensive native three-vessel coronary atherosclerosis with prior CABG. Moderate atherosclerosis involving the thoracic and proximal abdominal aorta without evidence of aneurysm. Aberrant RIGHT subclavian artery which arises from the distal aortic arch and courses POSTERIOR to the esophagus. Mediastinum/Nodes: No pathologically enlarged mediastinal, hilar or axillary lymph nodes. No mediastinal masses. Normal-appearing esophagus. Multiple thyroid nodules as noted on the prior CT. Lungs/Pleura: Small BILATERAL pleural effusions, RIGHT greater than LEFT. Associated atelectasis in the lower lobes. No confluent or ground-glass airspace consolidation in either lung. No parenchymal nodules or masses. No evidence of interstitial edema. Central airways patent without significant bronchial wall thickening. Upper Abdomen: No acute findings. Musculoskeletal: Degenerative changes and DISH involving the thoracic spine. Degenerative disc disease and spondylosis involving the lower cervical spine. No acute findings. Review of the MIP images confirms the above findings. IMPRESSION: 1. No evidence of pulmonary embolism. 2. Small bilateral pleural effusions, right greater than left, with associated mild passive atelectasis in the lower lobes. No acute cardiopulmonary disease otherwise. 3. Moderate  cardiomegaly with right atrial and right ventricular enlargement in particular. 4. Aberrant right subclavian artery. Aortic Atherosclerosis (ICD10-I70.0). Electronically Signed   By: Evangeline Dakin M.D.   On: 06/09/2018 15:19   Dg Chest Port 1 View  Result Date: 06/09/2018 CLINICAL DATA:  Shortness of breath. EXAM: PORTABLE CHEST 1 VIEW COMPARISON:  Jun 08, 2018 FINDINGS: Stable cardiomegaly. The heart, hila, mediastinum, lungs, and pleura are otherwise normal. IMPRESSION: No active disease. Electronically Signed   By: Dorise Bullion III M.D   On: 06/09/2018 10:12   Dg Abd Portable 2v  Result Date: 06/10/2018 CLINICAL DATA:  Vomiting today. EXAM: PORTABLE ABDOMEN - 2 VIEW COMPARISON:  Chest x-ray 06/09/2018 FINDINGS: The stomach is moderately dilated.  There is mild gaseous distention of multiple loops of small bowel. Gas is seen into the distal colon. Fluid levels are present on the decubitus image. There is no free air. IMPRESSION: 1. Gaseous distension of small bowel and colon with fluid levels suggesting ileus. No discrete obstruction is present. 2. The stomach is distended as well. 3. No free air. Electronically Signed   By: San Morelle M.D.   On: 06/10/2018 11:45   Vas [place] Upper Extremity Arterial Duplex  Result Date: 06/10/2018 UPPER EXTREMITY DUPLEX STUDY Indications: Cold extremity. LUE AVG.  Performing Technologist: Maudry Mayhew MHA, RDMS, RVT, RDCS  Examination Guidelines: A complete evaluation includes B-mode imaging, spectral Doppler, color Doppler, and power Doppler as needed of all accessible portions of each vessel. Bilateral testing is considered an integral part of a complete examination. Limited examinations for reoccurring indications may be performed as noted.  Left Doppler Findings: +-----------+----------+-------------------+------+--------+ Site       PSV (cm/s)Waveform           PlaqueComments +-----------+----------+-------------------+------+--------+  Subclavian 129       monophasic                        +-----------+----------+-------------------+------+--------+ Axillary   224       monophasic                        +-----------+----------+-------------------+------+--------+ Brachial   323       monophasic                        +-----------+----------+-------------------+------+--------+ Radial     59        dampened monophasic               +-----------+----------+-------------------+------+--------+ Palmar Arch18        dampened monophasic               +-----------+----------+-------------------+------+--------+ AVG anastomosis is at distal upper arm.  Summary:  Left: Arterial flow is patent through to the hand, however there is       significantly reduced flow distal to the anastomosis at the       distal upper arm. *See table(s) above for measurements and observations. Electronically signed by Ruta Hinds MD on 06/10/2018 at 9:59:45 AM.    Final     Cardiac Studies   Echo 09/04/2015 LV EF: 55% -   60%  ------------------------------------------------------------------- Indications:      CVA 60.  ------------------------------------------------------------------- History:   PMH:   Coronary artery disease.  Congestive heart failure.  PMH:   Myocardial infarction.  Risk factors: Hypertension. Diabetes mellitus.  ------------------------------------------------------------------- Study Conclusions  - Left ventricle: The cavity size was normal. Wall thickness was   increased in a pattern of mild LVH. Systolic function was normal.   The estimated ejection fraction was in the range of 55% to 60%.   Wall motion was normal; there were no regional wall motion   abnormalities. Doppler parameters are consistent with abnormal   left ventricular relaxation (grade 1 diastolic dysfunction).   Doppler parameters are consistent with high ventricular filling   pressure. - Mitral valve: Calcified annulus.   Impressions:  - Normal LV systolic function; mild LVH; grade 1 diastolic   dysfunction with elevated LV filling pressure.  Patient Profile     73 y.o. female with PM of IDDM, chronic atrial fibrillation on eliquis, HTN, CAD s/p CABG 1996, ESRD on HD MWF  presented with a fall and found to have displaced intertrochanteric fracture of R femur. Cardiology consulted for managed of atrial fibrillation.   Assessment & Plan    1. Chronic atrial fibrillation  - currently on IV metoprolol 5mg  q 8 hrs and 78ml/hr of IV dilt. HR very well controlled at 60-70 bpm. SBP stable between 120-130s.   - recommend D/C IV diltiazem and IV metoprolol, switched to metoprolol tartrate 50mg  BID. Later if HR remain uncontrolled, may increase to 75mg  BID. Note, patient was on metoprolol succinate at home, however since EF is normal, metoprolol tartrate should be fine as well  - restart home eliquis once felt safe from surgery side  2. CAD s/p CABG 1996: troponin was elevated 0.81 --> 0.62 --> 0.48. Seen by Dr. Johnsie Cancel, who felt this is likely demand ischemia  3. IDDM   4. HTN: BP stable.   5. 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 R femur fracture: underwent intramedullary implant by orthopedic on 06/08/2018. Weight bearing as tolerated.        For questions or updates, please contact [place] Please consult [email or web address] for contact info under    Signed, Almyra Deforest, [place]  06/11/2018, 9:14 AM    I have seen and examined the patient along with Almyra Deforest, PA.   I have reviewed the chart, notes and new data.  I agree with PA/NP's note.  Key new complaints: No CV complaints. Hip "hurts less than I thought it would". Key examination changes: irregular rhythm, no signs of HF Key new findings / data: troponin 0.48  PLAN: Suspect that with surgery behind her and pain controlled, it will be easier to  control the ventricular rate. Will place on home dose of metoprolol and can increase the dose as needed. She has a very low diastolic BP (AV fistula and/or arterial noncompliance) and may do better with heart rates in 80-90s, rather than 60-70s, especially during HD.  Sanda Klein, MD, [place] [phone] 06/11/2018, 12:13 PM

## 2018-06-11 NOTE — Progress Notes (Signed)
Pt did not void all night long. Pt said its normal for her not to void all night long sometimes. Bladder scan was 105 ml. Pt denies any discomfort or urge to urinate. Will continue to monitor.

## 2018-06-11 NOTE — Anesthesia Postprocedure Evaluation (Signed)
Anesthesia Post Note  Patient: Christine Santos  Procedure(s) Performed: INTRAMEDULLARY (IM) NAIL INTERTROCHANTRIC (Right Hip)     Patient location during evaluation: PACU Anesthesia Type: General Level of consciousness: awake and alert Pain management: pain level controlled Vital Signs Assessment: post-procedure vital signs reviewed and stable Respiratory status: spontaneous breathing, nonlabored ventilation and respiratory function stable Cardiovascular status: blood pressure returned to baseline and stable Postop Assessment: no apparent nausea or vomiting Anesthetic complications: no                   Brennan Bailey

## 2018-06-11 NOTE — Progress Notes (Signed)
[facility] Progress Note   Subjective:  Events noted- moved to 4E-  HR much better on diltiazem drip, troponin peaked at  0.8.  Xray suggestive of post op ileus, passing gas.  Hand still numb and tingly but no pain - overall she is much better  Objective Vitals:   06/10/18 1800 06/10/18 1954 06/11/18 0002 06/11/18 0426  BP: (!) 121/59 126/64 (!) 126/47 137/65  Pulse: 84 82 67 78  Resp: 19 16 16 19   Temp: 98.5 F (36.9 C) 98.3 F (36.8 C) 97.9 F (36.6 C) 98.4 F (36.9 C)  TempSrc: Oral Oral Oral Oral  SpO2: 100% 100% 98% 98%  Weight:    77.7 kg  Height:        Physical Exam General: Ill appearing elderly female on nasal oxygen  Heart: Tachy, irregular  Lungs: CTA anteriorly  Abdomen: soft NT Extremities: No sig edema;  Dialysis Access: LUE AVG +bruit    Weight change: 2.4 kg   Additional Objective Labs: Basic Metabolic Panel: Recent Labs  Lab 06/08/18 0802 06/09/18 0430 06/09/18 1837 06/10/18 0350 06/11/18 0227  NA 136 137  --  136 133*  K 6.2* 5.8* 5.5* 5.1 5.4*  CL 94* 97*  --  94* 94*  CO2 26 20*  --  22 21*  GLUCOSE 168* 174*  --  134* 116*  BUN 46* 28*  --  54* 70*  CREATININE 7.34* 5.07*  --  6.66* 7.62*  CALCIUM 8.9 8.8*  --  8.9 8.4*  PHOS 6.7*  --   --  8.5* 8.3*   CBC: Recent Labs  Lab 06/06/18 2059 06/08/18 0801 06/09/18 0430 06/10/18 0350 06/11/18 0227  WBC 12.4* 14.9* 17.6* 14.2* 13.1*  NEUTROABS 8.4*  --   --   --   --   HGB 11.9* 12.2 11.8* 11.2* 10.5*  HCT 38.4 38.7 38.6 34.7* 32.9*  MCV 99.2 98.2 100.8* 95.9 96.5  PLT 143* 151 133* 165 147*   Blood Culture    Component Value Date/Time   SDES BLOOD RIGHT ANTECUBITAL 10/29/2017 1125   SDES BLOOD RIGHT ANTECUBITAL 10/29/2017 1125   SPECREQUEST  10/29/2017 1125    BOTTLES DRAWN AEROBIC AND ANAEROBIC Blood Culture adequate volume   SPECREQUEST  10/29/2017 1125    BOTTLES DRAWN AEROBIC AND ANAEROBIC Blood Culture adequate volume   CULT  10/29/2017 1125    NO  GROWTH 5 DAYS Performed at [facility], [place] [address], [place], [place] [address]    CULT  10/29/2017 1125    NO GROWTH 5 DAYS Performed at [facility], [place] [address], [place], [place] [address]    REPTSTATUS 11/03/2017 FINAL 10/29/2017 1125   REPTSTATUS 11/03/2017 FINAL 10/29/2017 1125     Medications: . sodium chloride    . acetaminophen 1,000 mg (06/11/18 0043)  . diltiazem (CARDIZEM) infusion 5 mg/hr (06/11/18 0139)   . allopurinol  50 mg Oral Daily  . calcitRIOL  0.25 mcg Oral Q M,W,F-HD  . Chlorhexidine Gluconate Cloth  6 each Topical Daily  . Chlorhexidine Gluconate Cloth  6 each Topical Q0600  . docusate sodium  100 mg Oral BID  . ferric citrate  420 mg Oral TID WC  . insulin aspart  0-9 Units Subcutaneous Q4H  . insulin glargine  6 Units Subcutaneous Daily  . metoprolol tartrate  5 mg Intravenous Q8H  . mupirocin ointment  1 application Nasal BID  . pantoprazole (PROTONIX) IV  40 mg Intravenous QHS  .  PARoxetine  40 mg Oral Daily  . polyethylene glycol  17 g Oral Daily  . rosuvastatin  20 mg Oral Daily    Dialysis Orders:  [facility] M[facility]. [id number] Bath 2k,2caTime 4Heparin none. AccessLUA AVGBFR 350DFR 500  Calcitriol 0.14mcg po/HD Last op hgb 11.8 No esa   Assessment/Plan: Pt is a 73 y.o. yo female ESRD who was admitted on 06/06/2018 with right hip fracture  1. Hip fracture-  Mechanical fall- s/p intermedullary nail 5/15. Per ortho.  2. ESRD - normally MWF at [facility] via AVG-  K above 5- has been medically treated this hosp planning for HD today- 2 K bath now that HR is better   3. Anemia- has not been an issue of late, no ESA as OP- suspect will go down once has surgery. Hgb 10.5. Follow  4. Secondary hyperparathyroidism- cont home calcitriol and auryxia- phos 8.3-  PO meds on hold for now 5. HTN/volume- BP seems fine, now on diltiazem and beta blocker with much better HR. 6. Afib-  Symptomatic with rapid rate previously, now better.  eliquis on hold for surgery. Per primary and cards, troponin up but not determined to be significant.   7.  Steal to left hand-  Vascular rec OP eval- hopefully could improve with banding and not need full ligation- no emergency  8.  Post op ileus- seems to be symptomatically improving   Corliss Parish, MD 06/11/2018

## 2018-06-11 NOTE — Progress Notes (Signed)
Hypoglycemic Event  CBG: 64  Treatment: D50 25 mL (12.5 gm)  Symptoms: None  Follow-up CBG Result:116  Possible Reasons for Event: Inadequate meal intake     Mitzi Hansen, [place]

## 2018-06-11 NOTE — Evaluation (Signed)
Physical Therapy Evaluation Patient Details Name: Christine Santos MRN: [id number] DOB: 11-19-1945 Today's Date: 06/11/2018   History of Present Illness  Pt is a 73 y.o. female presenting with right hip fracture after a fall at home Now s/p IM nail RLE. PMH is significant for atrial fibrillation on Eliquis, chronic anemia, ESRD on HD, T2DM on insulin, HTN, HLD, GERD, CHF, gout.  Clinical Impression  Patient presents with pain, post surgical deficits RLE and impaired mobility s/p above. Pt lives with her friend, Abe People, and uses RW vs [facility] for ambulation PTA. Reports being Mod I with mobility and drives. Pt with hx of falls due to being unsteady. Today, pt requires Mod A for bed mobility and transfers. Initially, pt with dizziness sitting EOB with drop in BP, which resolved with exercise and time. Difficult to get Sp02 reading but read ~80% on RA during session so donned 2L/min 02. Tolerated taking a few steps to get to chair with assist of 2 to manage lines. Reviewed IS. Would benefit from SNF to maximize independence and mobility prior to return home. Will follow acutely.     Follow Up Recommendations SNF;Supervision for mobility/OOB;Supervision/Assistance - 24 hour    Equipment Recommendations  None recommended by PT    Recommendations for Other Services       Precautions / Restrictions Precautions Precautions: Fall Precaution Comments: watch 02 Restrictions Weight Bearing Restrictions: Yes RLE Weight Bearing: Weight bearing as tolerated      Mobility  Bed Mobility Overal bed mobility: Needs Assistance Bed Mobility: Rolling;Sidelying to Sit Rolling: Mod assist Sidelying to sit: Mod assist;+2 for physical assistance;HOB elevated       General bed mobility comments: Step by step cues for technique, to reach for rail; assist with RLE, to elevate trunk to upright and scoot bottom towards EOB.  + dizziness.   Transfers Overall transfer level: Needs assistance Equipment used: Rolling  walker (2 wheeled) Transfers: Sit to/from Stand Sit to Stand: Mod assist;From elevated surface         General transfer comment: Assist to power to standing with cues for hand/foot placement. Stood from [employer].   Ambulation/Gait Ambulation/Gait assistance: Min assist;+2 safety/equipment Gait Distance (Feet): 3 Feet Assistive device: Rolling walker (2 wheeled) Gait Pattern/deviations: Trunk flexed;Step-to pattern;Shuffle Gait velocity: decreased   General Gait Details: Able to take a few steps to get to chair with increased WB through BUEs and progressing LLE due to pain through RLE with WB. Sp02 dropped to 80% on RA, donned 2L/min 02.   Stairs            Wheelchair Mobility    Modified Rankin (Stroke Patients Only)       Balance Overall balance assessment: Needs assistance Sitting-balance support: Feet supported;Bilateral upper extremity supported Sitting balance-Leahy Scale: Poor Sitting balance - Comments: Requires BUE support in sitting or external support esp initially when dizzy. Postural control: Posterior lean Standing balance support: During functional activity;Bilateral upper extremity supported Standing balance-Leahy Scale: Poor Standing balance comment: Requires BUE support in standing.                             Pertinent Vitals/Pain Pain Assessment: 0-10 Pain Score: 4  Pain Location: right hip Pain Descriptors / Indicators: Sore;Operative site guarding Pain Intervention(s): Monitored during session;Repositioned;Limited activity within patient's tolerance    Home Living Family/patient expects to be discharged to:: Private residence Living Arrangements: Non-relatives/Friends(with Abe People) Available Help at Discharge: Friend(s);Available 24 hours/day  Type of Home: House Home Access: Stairs to enter Entrance Stairs-Rails: Left Entrance Stairs-Number of Steps: 2 Home Layout: One level Home Equipment: Walker - 2 wheels;Cane - quad;Cane -  single point;Shower seat;Grab bars - Dispensing optician - 4 wheels;Bedside commode Additional Comments: Lives with her friend Abe People who is visually impaired.    Prior Function Level of Independence: Independent with assistive device(s)         Comments: Uses rollator for ambulation; bus takes her to dialysis.     Hand Dominance   Dominant Hand: Right    Extremity/Trunk Assessment   Upper Extremity Assessment Upper Extremity Assessment: Defer to OT evaluation    Lower Extremity Assessment Lower Extremity Assessment: RLE deficits/detail RLE Deficits / Details: Limited AROM secondary to pain and post op.  RLE Sensation: decreased light touch    Cervical / Trunk Assessment Cervical / Trunk Assessment: Kyphotic  Communication   Communication: No difficulties  Cognition Arousal/Alertness: Awake/alert Behavior During Therapy: WFL for tasks assessed/performed Overall Cognitive Status: No family/caregiver present to determine baseline cognitive functioning                                 General Comments: WFL for basic mobility tasks however pt changing answers to PLOF questions during session- uses RW, but then only SPC. Drives but then takes the bus etc. Able to follow 2 step commands. A&Ox3.      General Comments General comments (skin integrity, edema, etc.): Supine BP 127/52, sitting BP 100/40, sitting ~3 mins BP 121/53. Pt dizzy initially which resolved with time and exercise.    Exercises General Exercises - Lower Extremity Long Arc Quad: Left;10 reps;Seated   Assessment/Plan    PT Assessment Patient needs continued PT services  PT Problem List Decreased strength;Decreased balance;Decreased cognition;Decreased knowledge of precautions;Pain;Cardiopulmonary status limiting activity;Decreased mobility;Decreased range of motion;Decreased activity tolerance;Impaired sensation;Decreased skin integrity       PT Treatment Interventions  Functional mobility training;Balance training;Patient/family education;Gait training;Therapeutic activities;Therapeutic exercise    PT Goals (Current goals can be found in the Care Plan section)  Acute Rehab PT Goals Patient Stated Goal: to get better, go to rehab and then home PT Goal Formulation: With patient Time For Goal Achievement: 06/25/18 Potential to Achieve Goals: Good    Frequency Min 3X/week   Barriers to discharge        Co-evaluation PT/OT/SLP Co-Evaluation/Treatment: Yes Reason for Co-Treatment: To address functional/ADL transfers;For patient/therapist safety PT goals addressed during session: Mobility/safety with mobility;Balance;Strengthening/ROM         AM-PAC PT "6 Clicks" Mobility  Outcome Measure Help needed turning from your back to your side while in a flat bed without using bedrails?: A Little Help needed moving from lying on your back to sitting on the side of a flat bed without using bedrails?: A Lot Help needed moving to and from a bed to a chair (including a wheelchair)?: A Lot Help needed standing up from a chair using your arms (e.g., wheelchair or bedside chair)?: A Lot Help needed to walk in hospital room?: A Lot Help needed climbing 3-5 steps with a railing? : Total 6 Click Score: 12    End of Session Equipment Utilized During Treatment: Gait belt Activity Tolerance: Patient tolerated treatment well Patient left: in chair;with call bell/phone within reach;with chair alarm set Nurse Communication: Mobility status PT Visit Diagnosis: Pain;Difficulty in walking, not elsewhere classified (R26.2);Unsteadiness on feet (R26.81);History of falling (Z91.81) Pain -  Right/Left: Right Pain - part of body: Leg    Time: 5784-6962 PT Time Calculation (min) (ACUTE ONLY): 37 min   Charges:   PT Evaluation $PT Eval Moderate Complexity: 1 Mod          Wray Kearns, PT, DPT Acute Rehabilitation Services Pager [phone] Office  [place] 06/11/2018, 9:47 AM

## 2018-06-11 NOTE — Progress Notes (Addendum)
Family Medicine Teaching Service Daily Progress Note Intern Pager: [phone]  Patient name: Christine Santos Medical record number: [id number] Date of birth: Apr 21, 1945 Age: 73 y.o. Gender: female  Primary Care Provider: [name], MD Consultants: Orthopedics Code Status: Full code  Pt Overview and Major Events to Date:  5/13: admitted to [facility] for R femur fracture 5/15: ORIF of right femur  Assessment and Plan: Christine Santos is a 73 y.o. female who presented after a fall and subsequent displaced intertrochanteric fracture of right femur. PMHx significant for IBS, reflux, diabetic neuropathy, IDDM, paroxysmal A. fib on Eliquis, HTN, systolic heart failure, PVD, sinus bradycardia, CAD, stress incontinence, ESRD on HD MWF, secondary hyperparathyroidism, ACD.  Post-OP Ileus Postop ileus seen on abdominal x-ray yesterday.  Troponins trended down 0.81>0.62>0.48 and EKG remained without acute changes, therefore doubt ACS as cause.  Occult blood emesis negative.  Pain this a.m. well-controlled.  Has passed flatus but no BM. - N.p.o. will add sips with meds given lack of emesis - d/c hep gtt, start eliquis - compazine prn nausea given prolonged QTc   A.Fib with RVR  Prolonged QTc on EKG: Home meds: Metoprolol XL 25mg  QD, Eliquis 5mg .  HR 68-86 overnight.  HR 76 this AM.  Remains on tilt drip started on 5/17.  Cardiology consulted yesterday and also started metoprolol every 8 hours. -Cardiology consulted, appreciate recommendations -Plan to transition off Dilt drip, pending cardiology recommendations - Continue Metoprolol - Eliquis per above - Telemetry - Avoid QT prolonging agents  Acute Hypoxic Respiratory Failure  Occurred following surgery, desats to 75s.  Remains on 2 L without evidence of respiratory distress.  Patient did go up to 3 L last night without recorded desaturation.  This a.m. on 2L and breathing comfortably on RA. - O2 prn - wean as tolerated  Right femur fracture  s/p ORIF: POD 3 s/p ORIF right femur.  Opioid regimen decreased yesterday in setting of postop ileus.  Patient received 1 as needed of morphine overnight.  Pain this a.m. was improving. -PT/OT - Ortho consulted, appreciate care and recs - f/u outpatient 2 weeks for staple removal - start tylenol PO q6h sch - oxy IR 2.5mg  q6h prn - continue to hold home PRN tramadol - fall precautions - incentive spirometry, O2 PRN  - weight bearing as tolerated, up with assistance - reinforce dressing  Concern for LUE Ischemia: Initial concern as well as cold to palpation, duplex with flow to left hand but reduced consistent with steal syndrome.  Patient seen by vascular surgery who noted that given her deconditioned state and chronicity of symptoms, will follow-up as outpatient in a few weeks. -Follow-up VS as outpatient  ESRD with HD MWF  Anemia of Chronic Disease  2` Hyperparathyroidism: HD today. K 5.1>5.4. Hgb 12.2>11.8>11.2>10.5. - HD and electrolytes per nephro - continue to monitor K+ - Daily RFP - Continue home calcitriol and Auryxia - Renal diet - Avoid continuous fluids  IDDM: Home meds: Lantus 12U and Humalog 5U TID with meals. Patient now NPO given postop ileus.  CBG this a.m. 93.  Received 4 units of NovoLog in last 24 hours and 6 units of Lantus. - sSSI - cont 6U Lantus qAM, will adjust patient no longer n.p.o. - CBG with meals  HTN:  BP 137/65 this AM. Home meds: Hydralazine 50mg  QD and Metoprolol 25mg  QD - Continue Metoprolol  - Continue to hold home hydral given soft BP's  CHF:  Last echo 2017, EF 55-60%. Home meds: Metoprolol  XL 25mg  QD.  - Continue Metoprolol  Depression: Home meds: Paxil 40mg  QD - continue home meds  HLD:  - Continue home Rosuvastatin 20mg   GERD:  - Continue home Protonix 40mg   Gout:  - Restart home Allopurinol 50mg  QD  Insomnia:  - Can restart home Melatonin 10mg  qHS PRN for sleep if requested  FEN/GI: Renal diet PPx:  Lovenox  Disposition: Pending PT/OT recommendations when medically stable, will need to assess if she is able to be cared for appropriately at home  Subjective:  Patient states that leg pain worsened this AM and requested some morphine that she stated was helping.  Noted that she has not vomited since yesterday and passed flatus.  Objective: Temp:  [97.9 F (36.6 C)-98.5 F (36.9 C)] 98.4 F (36.9 C) (05/18 0426) Pulse Rate:  [67-135] 78 (05/18 0426) Resp:  [16-23] 19 (05/18 0426) BP: (108-143)/(47-75) 137/65 (05/18 0426) SpO2:  [95 %-100 %] 98 % (05/18 0426) Weight:  [77.7 kg] 77.7 kg (05/18 0426)  Physical Exam:  General: 73 y.o. female in NAD Cardio: RRR no m/r/g Lungs: CTAB, no wheezing, no rhonchi, no crackles, no IWOB on 2 L Abdomen: Soft, non-tender to palpation, non-distended, positive bowel sounds Skin: warm and dry Extremities: No edema, bandages on right lower extremity without surrounding erythema    Laboratory: Recent Labs  Lab 06/09/18 0430 06/10/18 0350 06/11/18 0227  WBC 17.6* 14.2* 13.1*  HGB 11.8* 11.2* 10.5*  HCT 38.6 34.7* 32.9*  PLT 133* 165 147*   Recent Labs  Lab 06/07/18 0353  06/09/18 0430 06/09/18 1837 06/10/18 0350 06/11/18 0227  NA 140   < > 137  --  136 133*  K 5.0   < > 5.8* 5.5* 5.1 5.4*  CL 95*   < > 97*  --  94* 94*  CO2 29   < > 20*  --  22 21*  BUN 27*   < > 28*  --  54* 70*  CREATININE 5.35*   < > 5.07*  --  6.66* 7.62*  CALCIUM 9.3   < > 8.8*  --  8.9 8.4*  PROT 5.4*  --   --   --   --   --   BILITOT 0.9  --   --   --   --   --   ALKPHOS 150*  --   --   --   --   --   ALT 64*  --   --   --   --   --   AST 49*  --   --   --   --   --   GLUCOSE 137*   < > 174*  --  134* 116*   < > = values in this interval not displayed.    Imaging/Diagnostic Tests: Dg Abd Portable 2v  Result Date: 06/10/2018 CLINICAL DATA:  Vomiting today. EXAM: PORTABLE ABDOMEN - 2 VIEW COMPARISON:  Chest x-ray 06/09/2018 FINDINGS: The stomach is  moderately dilated. There is mild gaseous distention of multiple loops of small bowel. Gas is seen into the distal colon. Fluid levels are present on the decubitus image. There is no free air. IMPRESSION: 1. Gaseous distension of small bowel and colon with fluid levels suggesting ileus. No discrete obstruction is present. 2. The stomach is distended as well. 3. No free air. Electronically Signed   By: San Morelle M.D.   On: 06/10/2018 11:45    Meccariello, Bernita Raisin, DO 06/11/2018, 7:26 AM PGY-1, [facility]  [place] Intern pager: [phone], text pages welcome

## 2018-06-11 NOTE — Procedures (Signed)
Patient was seen on dialysis and the procedure was supervised.  BFR 400  Via AVF BP is  108/54.   Patient appears to be tolerating treatment well- HR is doing well- having loose stools, distressed about that   Louis Meckel 06/11/2018

## 2018-06-12 LAB — RENAL FUNCTION PANEL
Albumin: 2.5 g/dL — ABNORMAL LOW (ref 3.5–5.0)
Anion gap: 17 — ABNORMAL HIGH (ref 5–15)
BUN: 25 mg/dL — ABNORMAL HIGH (ref 8–23)
CO2: 26 mmol/L (ref 22–32)
Calcium: 8.5 mg/dL — ABNORMAL LOW (ref 8.9–10.3)
Chloride: 94 mmol/L — ABNORMAL LOW (ref 98–111)
Creatinine, Ser: 4.19 mg/dL — ABNORMAL HIGH (ref 0.44–1.00)
GFR calc Af Amer: 11 mL/min — ABNORMAL LOW (ref >60)
GFR calc non Af Amer: 10 mL/min — ABNORMAL LOW (ref >60)
Glucose, Bld: 127 mg/dL — ABNORMAL HIGH (ref 70–99)
Phosphorus: 5.2 mg/dL — ABNORMAL HIGH (ref 2.5–4.6)
Potassium: 4.1 mmol/L (ref 3.5–5.1)
Sodium: 137 mmol/L (ref 135–145)

## 2018-06-12 LAB — CBC
HCT: 33.9 % — ABNORMAL LOW (ref 36.0–46.0)
Hemoglobin: 10.9 g/dL — ABNORMAL LOW (ref 12.0–15.0)
MCH: 31.4 pg (ref 26.0–34.0)
MCHC: 32.2 g/dL (ref 30.0–36.0)
MCV: 97.7 fL (ref 80.0–100.0)
Platelets: 147 10*3/uL — ABNORMAL LOW (ref 150–400)
RBC: 3.47 MIL/uL — ABNORMAL LOW (ref 3.87–5.11)
RDW: 20.1 % — ABNORMAL HIGH (ref 11.5–15.5)
WBC: 11 10*3/uL — ABNORMAL HIGH (ref 4.0–10.5)
nRBC: 0.5 % — ABNORMAL HIGH (ref 0.0–0.2)

## 2018-06-12 LAB — GLUCOSE, CAPILLARY
Glucose-Capillary: 123 mg/dL — ABNORMAL HIGH (ref 70–99)
Glucose-Capillary: 136 mg/dL — ABNORMAL HIGH (ref 70–99)
Glucose-Capillary: 169 mg/dL — ABNORMAL HIGH (ref 70–99)
Glucose-Capillary: 230 mg/dL — ABNORMAL HIGH (ref 70–99)

## 2018-06-12 MED ORDER — CHLORHEXIDINE GLUCONATE CLOTH 2 % EX PADS
6.0000 | MEDICATED_PAD | Freq: Every day | CUTANEOUS | Status: DC
  Administered 2018-06-12: 6 via TOPICAL

## 2018-06-12 NOTE — Progress Notes (Signed)
[facility] Progress Note   Subjective: no new c/o's.  Feeling postop pain. No sob /cp  Objective Vitals:   06/11/18 2148 06/12/18 0038 06/12/18 0437 06/12/18 1000  BP: (!) 119/58 (!) 98/56 (!) 132/52 (!) 140/38  Pulse:    87  Resp: (!) 27 19 15 19   Temp: 98.1 F (36.7 C) 97.7 F (36.5 C) 98.1 F (36.7 C) 97.6 F (36.4 C)  TempSrc: Oral Oral Oral Oral  SpO2: 100% 100% 100% 100%  Weight:   71.7 kg   Height:        Physical Exam General: Ill appearing elderly female comfortable and no distress Heart: Tachy, irregular  Lungs: CTA anteriorly  Abdomen: soft NT Extremities: No sig edema;  Dialysis Access: LUE AVG +bruit    [facility] MWF  4h  2/2 bath  68kg  LUA AVG  350/500  Hep none Calcitriol 0.74mcg po/HD Last op hgb 11.8 No esa    Summary: Pt is a 73 y.o. yo female ESRD who was admitted on 06/06/2018 with right hip fracture   Assessment/Plan: 1. Hip fracture-  Mechanical fall- s/p intermedullary nail 5/15. Per ortho.  2. ESRD - MWF HD.  HD tomorrow.  3. Anemia- has not been an issue of late, no ESA as OP- suspect will go down once has surgery. Hgb 10.5. Follow  4. Secondary hyperparathyroidism- cont home calcitriol and auryxia- phos 8.3-  PO meds on hold for now 5. HTN/volume- BP seems fine, now on diltiazem and beta blocker with much better HR. 6. Afib-  Symptomatic with rapid rate previously, now better. Back on eliquis. Per primary and cards, troponin up but not determined to be significant.   7.  Steal to left hand-  Vascular rec OP eval- hopefully could improve with banding and not need full ligation- no emergency  8.  Post op ileus- seems to be symptomatically improving   Kelly Splinter MD 06/12/2018, 10:30 AM    Additional Objective Labs: Basic Metabolic Panel: Recent Labs  Lab 06/10/18 0350 06/11/18 0227 06/12/18 0331  NA 136 133* 137  K 5.1 5.4* 4.1  CL 94* 94* 94*  CO2 22 21* 26  GLUCOSE 134* 116* 127*  BUN 54* 70* 25*   CREATININE 6.66* 7.62* 4.19*  CALCIUM 8.9 8.4* 8.5*  PHOS 8.5* 8.3* 5.2*   CBC: Recent Labs  Lab 06/06/18 2059 06/08/18 0801 06/09/18 0430 06/10/18 0350 06/11/18 0227 06/12/18 0331  WBC 12.4* 14.9* 17.6* 14.2* 13.1* 11.0*  NEUTROABS 8.4*  --   --   --   --   --   HGB 11.9* 12.2 11.8* 11.2* 10.5* 10.9*  HCT 38.4 38.7 38.6 34.7* 32.9* 33.9*  MCV 99.2 98.2 100.8* 95.9 96.5 97.7  PLT 143* 151 133* 165 147* 147*   Blood Culture    Component Value Date/Time   SDES BLOOD RIGHT ANTECUBITAL 10/29/2017 1125   SDES BLOOD RIGHT ANTECUBITAL 10/29/2017 1125   SPECREQUEST  10/29/2017 1125    BOTTLES DRAWN AEROBIC AND ANAEROBIC Blood Culture adequate volume   SPECREQUEST  10/29/2017 1125    BOTTLES DRAWN AEROBIC AND ANAEROBIC Blood Culture adequate volume   CULT  10/29/2017 1125    NO GROWTH 5 DAYS Performed at [facility], [place] [address], [place], [place] [address]    CULT  10/29/2017 1125    NO GROWTH 5 DAYS Performed at [facility], [place] [address] [place], [place] [address]    REPTSTATUS 11/03/2017 FINAL 10/29/2017 1125   REPTSTATUS  11/03/2017 FINAL 10/29/2017 1125     Medications: . sodium chloride     . acetaminophen  650 mg Oral Q6H  . allopurinol  50 mg Oral Daily  . apixaban  5 mg Oral BID  . calcitRIOL  0.25 mcg Oral Q M,W,F-HD  . Chlorhexidine Gluconate Cloth  6 each Topical Daily  . Chlorhexidine Gluconate Cloth  6 each Topical Q0600  . docusate sodium  100 mg Oral BID  . ferric citrate  420 mg Oral TID WC  . insulin aspart  0-9 Units Subcutaneous Q4H  . metoprolol succinate  25 mg Oral Daily  . mupirocin ointment  1 application Nasal BID  . pantoprazole (PROTONIX) IV  40 mg Intravenous QHS  . PARoxetine  40 mg Oral Daily  . polyethylene glycol  17 g Oral Daily  . rosuvastatin  20 mg Oral Daily

## 2018-06-12 NOTE — NC FL2 (Signed)
[place] LEVEL OF CARE SCREENING TOOL     IDENTIFICATION  Patient Name: Christine Santos Birthdate: Jul 09, 1945 Sex: female Admission Date (Current Location): 06/06/2018  [facility] and [place] Number:  Herbalist and Address:  [facility] [place], [place] [address] [place], [place] [address]      Provider Number: [id number]  Attending Physician Name and Address:  [name], MD  Relative Name and Phone Number:  [name]; sister; [phone]    Current Level of Care: Hospital Recommended Level of Care: [place] Prior Approval Number:    Date Approved/Denied:   PASRR Number: [id number]  Discharge Plan: SNF    Current Diagnoses: Patient Active Problem List   Diagnosis Date Noted  . Ileus ([place])   . Closed fracture of neck of right femur ([place])   . Displaced intertrochanteric fracture of right femur, initial encounter for closed fracture ([place]) 06/07/2018  . Controlled type 2 diabetes mellitus with complication, with long-term current use of insulin ([place])   . High risk social situation 02/05/2018  . ESRD on dialysis ([place])   . History of stroke 01/20/2017  . Physical deconditioning 12/30/2016  . Transaminitis 12/23/2016  . Paroxysmal atrial fibrillation ([place]) 10/22/2015  . Laryngopharyngeal reflux (LPR) 09/14/2015  . Diabetes mellitus type 2 with neurological manifestations ([place]) 02/26/2014  . Diabetic nephropathy ([place]) 05/23/2013  . Diabetic neuropathy ([place]) 11/30/2012  . Macular degeneration 07/04/2012  . Diabetic retinopathy ([place]) 03/20/2012  . Osteopenia 09/22/2011  . Anemia in chronic kidney disease 08/29/2011  . Gout 04/13/2011  . Anxiety 08/18/2010  . Chronic systolic heart failure ([place]) 02/18/2009  . SINUS BRADYCARDIA 10/29/2008  . Peripheral vascular disease ([place]) 06/18/2008  . ESRD (end stage renal disease) on dialysis ([place]) 07/13/2007  . Multiple complications of type II diabetes mellitus ([place])  03/23/2006  . HYPERCHOLESTEROLEMIA 03/23/2006  . Essential hypertension 03/23/2006  . Coronary atherosclerosis 03/23/2006  . Reflux esophagitis 03/23/2006  . CYSTOCELE/RECTOCELE/PROLAPSE,UNSPEC. 03/23/2006  . Osteoarthritis, multiple sites 03/23/2006    Orientation RESPIRATION BLADDER Height & Weight     Self  O2(nasal cannula 1(L/min)) Incontinent Weight: 158 lb 1.1 oz (71.7 kg) Height:  5\' 5"  (165.1 cm)  BEHAVIORAL SYMPTOMS/MOOD NEUROLOGICAL BOWEL NUTRITION STATUS      Incontinent Diet(see discharge summary)  AMBULATORY STATUS COMMUNICATION OF NEEDS Skin     Verbally Surgical wounds(incision(closed) hip, right; incision(closed)thigh anterior , right; incision(closed) chest left )                       Personal Care Assistance Level of Assistance  Bathing, Feeding, Dressing Bathing Assistance: Limited assistance Feeding assistance: Independent Dressing Assistance: Limited assistance     Functional Limitations Info  Sight, Hearing, Speech Sight Info: Adequate Hearing Info: Adequate      SPECIAL CARE FACTORS FREQUENCY  PT (By licensed PT), OT (By licensed OT)     PT Frequency: 5x per week  OT Frequency: 5x per week             Contractures Contractures Info: Present    Additional Factors Info  Code Status, Allergies Code Status Info: FULL Allergies Info: Cefepime, Amoxicillin, Codeine Phosphate, Penicillins, Propoxyphene           Current Medications (06/12/2018):  This is the current hospital active medication list Current Facility-Administered Medications  Medication Dose Route Frequency Provider Last Rate Last Dose  . 0.9 %  sodium chloride infusion   Intravenous Continuous Winfrey, Alcario Drought,  MD      . acetaminophen (TYLENOL) tablet 650 mg  650 mg Oral Q6H Guadalupe Dawn, MD   650 mg at 06/12/18 1003  . allopurinol (ZYLOPRIM) tablet 50 mg  50 mg Oral Daily Kathrene Alu, MD   50 mg at 06/12/18 0957  . apixaban (ELIQUIS) tablet 5 mg  5 mg Oral  BID Meccariello, Bernita Raisin, DO   5 mg at 06/12/18 0957  . calcitRIOL (ROCALTROL) capsule 0.25 mcg  0.25 mcg Oral Q M,W,F-HD Kathrene Alu, MD   0.25 mcg at 06/11/18 1644  . Chlorhexidine Gluconate Cloth 2 % PADS 6 each  6 each Topical Daily Kathrene Alu, MD   6 each at 06/12/18 1000  . Chlorhexidine Gluconate Cloth 2 % PADS 6 each  6 each Topical Q0600 Kathrene Alu, MD   6 each at 06/11/18 0620  . Chlorhexidine Gluconate Cloth 2 % PADS 6 each  6 each Topical Q0600 Roney Jaffe, MD   6 each at 06/12/18 1003  . docusate sodium (COLACE) capsule 100 mg  100 mg Oral BID Kathrene Alu, MD   100 mg at 06/12/18 0957  . ferric citrate (AURYXIA) tablet 420 mg  420 mg Oral TID WC Kathrene Alu, MD   420 mg at 06/12/18 1303  . insulin aspart (novoLOG) injection 0-9 Units  0-9 Units Subcutaneous Q4H Kathrene Alu, MD   1 Units at 06/12/18 1302  . menthol-cetylpyridinium (CEPACOL) lozenge 3 mg  1 lozenge Oral PRN Winfrey, Alcario Drought, MD       Or  . phenol (CHLORASEPTIC) mouth spray 1 spray  1 spray Mouth/Throat PRN Winfrey, Alcario Drought, MD      . methocarbamol (ROBAXIN) tablet 500 mg  500 mg Oral Q6H PRN Kathrene Alu, MD      . metoprolol succinate (TOPROL-XL) 24 hr tablet 25 mg  25 mg Oral Daily Croitoru, Mihai, MD   25 mg at 06/12/18 0956  . mupirocin ointment (BACTROBAN) 2 % 1 application  1 application Nasal BID Kathrene Alu, MD   1 application at 58/85/02 [phone]  . oxyCODONE (Oxy IR/ROXICODONE) immediate release tablet 2.5 mg  2.5 mg Oral Q6H PRN Guadalupe Dawn, MD   2.5 mg at 06/12/18 0916  . pantoprazole (PROTONIX) injection 40 mg  40 mg Intravenous QHS Meccariello, Bailey J, DO   40 mg at 06/11/18 2215  . PARoxetine (PAXIL) tablet 40 mg  40 mg Oral Daily Kathrene Alu, MD   40 mg at 06/12/18 0957  . polyethylene glycol (MIRALAX / GLYCOLAX) packet 17 g  17 g Oral Daily Kathrene Alu, MD   17 g at 06/11/18 1002  . prochlorperazine (COMPAZINE) injection 10 mg  10 mg  Intravenous Q4H PRN Maia Breslow C, MD      . rosuvastatin (CRESTOR) tablet 20 mg  20 mg Oral Daily Kathrene Alu, MD   20 mg at 06/12/18 0956  . sorbitol 70 % solution 30 mL  30 mL Oral Daily PRN Kathrene Alu, MD   30 mL at 06/11/18 1005     Discharge Medications: Please see discharge summary for a list of discharge medications.  Relevant Imaging Results:  Relevant Lab Results:   Additional Information SSn# [id number]  Vinie Sill, LCSWA

## 2018-06-12 NOTE — TOC Progression Note (Addendum)
Transition of Care [facility]) - Progression Note    Patient Details  Name: Christine Santos MRN: [id number] Date of Birth: 1945/11/22  Transition of Care [facility]) CM/SW [place], [place] Phone Number: 06/12/2018, 4:48 PM  Clinical Narrative:     CSW spoke with the patient at bedside. She was alert and oriented. CSW spoke with patient concerning possibility of rehab at [facility] before returning home.  Patient recognizes need for rehab before returning home and is agreeable to a SNF placement. Patient reported preference for [facility], which is also close to her HD clinic.CSW explained the SNF process and current no visitors rules. Patient states she understands.  Readmission risk assessment completed- Patient states no barriers.  Patient/family is realistic regarding therapy needs and expressed being hopeful for SNF placement. Patient expressed understanding of CSW role and discharge process as well as medical condition. No questions/concerns about plan or treatment at this time.  Thurmond Butts, MSW, [facility] Clinical Social Worker [phone]   Expected Discharge Plan: Skilled Nursing Facility Barriers to Discharge: Continued Medical Work up  Expected Discharge Plan and Services Expected Discharge Plan: [place]   Discharge Planning Services: CM Consult                                           Social Determinants of Health (SDOH) Interventions    Readmission Risk Interventions No flowsheet data found.

## 2018-06-12 NOTE — Progress Notes (Signed)
Progress Note  Patient Name: Christine Santos Date of Encounter: 06/12/2018  Primary Cardiologist: Larae Grooms, MD   Subjective   No cardiovascular complaints.  She is upset because she has not received any food.  There is sign on her board that says nothing by mouth. Rate control is good with heart rate mostly around 90 bpm at rest.  Inpatient Medications    Scheduled Meds: . acetaminophen  650 mg Oral Q6H  . allopurinol  50 mg Oral Daily  . apixaban  5 mg Oral BID  . calcitRIOL  0.25 mcg Oral Q M,W,F-HD  . Chlorhexidine Gluconate Cloth  6 each Topical Daily  . Chlorhexidine Gluconate Cloth  6 each Topical Q0600  . docusate sodium  100 mg Oral BID  . ferric citrate  420 mg Oral TID WC  . insulin aspart  0-9 Units Subcutaneous Q4H  . metoprolol succinate  25 mg Oral Daily  . mupirocin ointment  1 application Nasal BID  . pantoprazole (PROTONIX) IV  40 mg Intravenous QHS  . PARoxetine  40 mg Oral Daily  . polyethylene glycol  17 g Oral Daily  . rosuvastatin  20 mg Oral Daily   Continuous Infusions: . sodium chloride     PRN Meds: menthol-cetylpyridinium **OR** phenol, methocarbamol **OR** [DISCONTINUED] methocarbamol (ROBAXIN) IV, oxyCODONE, prochlorperazine, sorbitol   Vital Signs    Vitals:   06/11/18 1736 06/11/18 2148 06/12/18 0038 06/12/18 0437  BP: (!) 136/94 (!) 119/58 (!) 98/56 (!) 132/52  Pulse: (!) 107     Resp:  (!) 27 19 15   Temp:  98.1 F (36.7 C) 97.7 F (36.5 C) 98.1 F (36.7 C)  TempSrc:  Oral Oral Oral  SpO2:  100% 100% 100%  Weight:    71.7 kg  Height:        Intake/Output Summary (Last 24 hours) at 06/12/2018 1001 Last data filed at 06/11/2018 1639 Gross per 24 hour  Intake -  Output 2000 ml  Net -2000 ml   Last 3 Weights 06/12/2018 06/11/2018 06/11/2018  Weight (lbs) 158 lb 1.1 oz 156 lb 8.4 oz 161 lb 9.6 oz  Weight (kg) 71.7 kg 71 kg 73.3 kg      Telemetry    Atrial fibrillation with controlled ventricular response- Personally  Reviewed  ECG    Chronic afib 92 bpm - Personally Reviewed  Physical Exam   GEN: No acute distress.   Neck: No JVD Cardiac: irregularly irregular rhythm, regular rate Respiratory: Clear to auscultation bilaterally. GI: Soft, nontender, non-distended  MS: No edema; No deformity. Neuro:  Nonfocal  Psych: Normal affect   Labs    Chemistry Recent Labs  Lab 06/07/18 0353  06/10/18 0350 06/11/18 0227 06/12/18 0331  NA 140   < > 136 133* 137  K 5.0   < > 5.1 5.4* 4.1  CL 95*   < > 94* 94* 94*  CO2 29   < > 22 21* 26  GLUCOSE 137*   < > 134* 116* 127*  BUN 27*   < > 54* 70* 25*  CREATININE 5.35*   < > 6.66* 7.62* 4.19*  CALCIUM 9.3   < > 8.9 8.4* 8.5*  PROT 5.4*  --   --   --   --   ALBUMIN 3.1*   < > 2.9* 2.4* 2.5*  AST 49*  --   --   --   --   ALT 64*  --   --   --   --  ALKPHOS 150*  --   --   --   --   BILITOT 0.9  --   --   --   --   GFRNONAA 7*   < > 6* 5* 10*  GFRAA 9*   < > 7* 6* 11*  ANIONGAP 16*   < > 20* 18* 17*   < > = values in this interval not displayed.     Hematology Recent Labs  Lab 06/10/18 0350 06/11/18 0227 06/12/18 0331  WBC 14.2* 13.1* 11.0*  RBC 3.62* 3.41* 3.47*  HGB 11.2* 10.5* 10.9*  HCT 34.7* 32.9* 33.9*  MCV 95.9 96.5 97.7  MCH 30.9 30.8 31.4  MCHC 32.3 31.9 32.2  RDW 19.7* 19.7* 20.1*  PLT 165 147* 147*    Cardiac Enzymes Recent Labs  Lab 06/10/18 1147 06/10/18 1702 06/11/18 0006  TROPONINI 0.81* 0.62* 0.48*   No results for input(s): TROPIPOC in the last 168 hours.   BNPNo results for input(s): BNP, PROBNP in the last 168 hours.   DDimer No results for input(s): DDIMER in the last 168 hours.   Radiology    Dg Abd Portable 2v  Result Date: 06/10/2018 CLINICAL DATA:  Vomiting today. EXAM: PORTABLE ABDOMEN - 2 VIEW COMPARISON:  Chest x-ray 06/09/2018 FINDINGS: The stomach is moderately dilated. There is mild gaseous distention of multiple loops of small bowel. Gas is seen into the distal colon. Fluid levels are  present on the decubitus image. There is no free air. IMPRESSION: 1. Gaseous distension of small bowel and colon with fluid levels suggesting ileus. No discrete obstruction is present. 2. The stomach is distended as well. 3. No free air. Electronically Signed   By: San Morelle M.D.   On: 06/10/2018 11:45    Cardiac Studies   Echo 09/04/2015  Study Conclusions  - Left ventricle: The cavity size was normal. Wall thickness was increased in a pattern of mild LVH. Systolic function was normal. The estimated ejection fraction was in the range of 55% to 60%. Wall motion was normal; there were no regional wall motion abnormalities. Doppler parameters are consistent with abnormal left ventricular relaxation (grade 1 diastolic dysfunction). Doppler parameters are consistent with high ventricular filling pressure. - Mitral valve: Calcified annulus.  Impressions:  - Normal LV systolic function; mild LVH; grade 1 diastolic dysfunction with elevated LV filling pressure.  Patient Profile     73 y.o. female with PMH of IDDM, chronic atrial fibrillation on eliquis, HTN, CAD s/p CABG 1996, ESRD on HD MWF who presented with a fall and found to have displaced intertrochanteric fracture of R femur. Cardiology consulted for managed of atrial fibrillation.   Assessment & Plan    1. Chronic Atrial Fibrillation: rate controlled w/ Metoprolol. On Eliquis for a/c.   2. CAD: h/o CABG in 1996. No chest pain. Continue medical management.   3. CKD: on HD. Management per nephrology.   4. Elevated Troponin:  Per chart review, she has a h/o chronically elevated troponins. Level this admit, 0.81>>0.62>>0.48. Not significant in setting of CRF, no acute ECG changes and no chest pain   5. HLD w/ LDL goal < 70: Lipid panel on 5/14 showed LDL to be at recommended goal at 69 mg/dL. Continue Crestor.   6. Femur Fx: s/p IM nail 06/08/18. Post op care/ PT progression per ortho.   7. DM: per  primary    For questions or updates, please contact [place] Please consult [email or web address] for contact info under  Signed, Lyda Jester, PA-C  06/12/2018, 10:01 AM    I have seen and examined the patient along with Lyda Jester, PA-C .  I have reviewed the chart, notes and new data.  I agree with PA's note.  Key new complaints: no angina/dyspnea Key examination changes: irregular rhythm rate controlled   PLAN: [facility] will sign off.   Medication Recommendations:  Continue current meds Other recommendations (labs, testing, etc):  n/a Follow up as an outpatient:  Will arrange follow up with Dr. Irish Lack.   Sanda Klein, MD, [place] [phone] 06/12/2018, 11:32 AM

## 2018-06-12 NOTE — Care Management Important Message (Signed)
Important Message  Patient Details  Name: Christine Santos MRN: [id number] Date of Birth: 1945/10/24   Medicare Important Message Given:  Yes    [name] 06/12/2018, 2:55 PM

## 2018-06-12 NOTE — Progress Notes (Addendum)
Family Medicine Teaching Service Daily Progress Note Intern Pager: [phone]  Patient name: Christine Santos Medical record number: [id number] Date of birth: 25-Jul-1945 Age: 73 y.o. Gender: female  Primary Care Provider: [name], MD Consultants: Orthopedics Code Status: Full code  Pt Overview and Major Events to Date:  5/13: admitted to [place] for R femur fracture 5/15: ORIF of right femur  Assessment and Plan: NIVEDITA Santos is a 73 y.o. female who presented after a fall and subsequent displaced intertrochanteric fracture of right femur. PMHx significant for IBS, reflux, diabetic neuropathy, IDDM, paroxysmal A. fib on Eliquis, HTN, systolic heart failure, PVD, sinus bradycardia, CAD, stress incontinence, ESRD on HD MWF, secondary hyperparathyroidism, ACD.  Post-OP Ileus Postop ileus seen on abdominal x-ray 5/17.  Advanced to NPO with sips for meds on 5/18.  No abdominal pain, had 4 BMs yesterday during dialysis. -Clear liquid diet, ADAT - compazine prn nausea given prolonged QTc   A.Fib with RVR  Prolonged QTc on EKG: Home meds: Metoprolol XL 25mg  QD, Eliquis 5mg . Dilt gtt d/c'ed on 5/18.  Placed on metoprolol 25mg  QD.  HR overnight 87-107, 90 this AM.  Per cards note, goal should be 80-90s given low diastolic BP, especially during HD.   -Cardiology consulted, appreciate recommendations, have signed off - Continue Metoprolol 25mg  QD - Cont Eliquis  - Telemetry - Avoid QT prolonging agents  Acute Hypoxic Respiratory Failure  Occurred following surgery, desats to 45s.  On 2L at 100% overnight.  This AM patient remains on 2L.  Will attempt to wean. - O2 prn - wean as tolerated  Right femur fracture s/p ORIF: POD 4 s/p ORIF right femur.  Patient on scheduled Tylenol and did not receive any as needed's of oxycodone last night.  This a.m. notes that she is having right leg pain, advised nurse to give prn oxy.  SNF recommended by PT/OT ordered 24-hour supervision at this time, will  wait to assess pending improvement. -PT/OT: SNF or 24hr supervision - Ortho consulted, appreciate care and recs - f/u outpatient 2 weeks for staple removal - cont tylenol PO q6h sch - oxy IR 2.5mg  q6h prn - continue to hold home PRN tramadol - fall precautions - incentive spirometry, O2 PRN  - weight bearing as tolerated, up with assistance - reinforce dressing  Concern for LUE Ischemia: Initial concern as well as cold to palpation, duplex with flow to left hand but reduced consistent with steal syndrome.  Patient seen by vascular surgery who noted that given her deconditioned state and chronicity of symptoms, will follow-up as outpatient in a few weeks. -Follow-up VS as outpatient  ESRD with HD MWF  Anemia of Chronic Disease  2` Hyperparathyroidism: HD 5/18. K 4.1. Hgb 10.9. - HD and electrolytes per nephro - continue to monitor K+ - Daily RFP - Continue home calcitriol and Auryxia - Renal diet - Avoid continuous fluids  IDDM: Home meds: Lantus 12U and Humalog 5U TID with meals. Patient now NPO given postop ileus.  CBG 64 at 1920.  Patient received 1 amp D50 and improved to 116 at 2122.  CBG 123 this AM.  Did not receive any insulin yesterday. - sSSI -Hold Lantus today while n.p.o., will add back as tolerated - CBG with meals  HTN:  BP 132/52.  Home meds: Hydralazine 50mg  QD and Metoprolol 25mg  QD - Continue Metoprolol  - Continue to hold home hydral given soft BP's  CHF:  Last echo 2017, EF 55-60%. Home meds: Metoprolol XL 25mg   QD.  - Continue Metoprolol  Depression: Home meds: Paxil 40mg  QD - continue home meds  HLD:  - Continue home Rosuvastatin 20mg   GERD:  - Continue home Protonix 40mg   Gout:  - Restart home Allopurinol 50mg  QD  Insomnia:  - Can restart home Melatonin 10mg  qHS PRN for sleep if requested  FEN/GI: Renal diet PPx: Lovenox  Disposition: SNF vs Home, pending improvement  Subjective:  Patient notes right leg pain.  No acute events  overnight.  Objective: Temp:  [97.4 F (36.3 C)-98.3 F (36.8 C)] 98.1 F (36.7 C) (05/19 0437) Pulse Rate:  [65-107] 107 (05/18 1736) Resp:  [15-27] 15 (05/19 0437) BP: (98-141)/(38-94) 132/52 (05/19 0437) SpO2:  [98 %-100 %] 100 % (05/19 0437) Weight:  [71 kg-73.3 kg] 71.7 kg (05/19 0437)  Physical Exam:  General: 73 y.o. female in NAD Cardio: RRR no m/r/g Lungs: CTAB, no wheezing, no rhonchi, no crackles, no IWOB on 2L Abdomen: Soft, non-tender to palpation, non-distended, positive bowel sounds Skin: warm and dry Extremities: No edema, bandages on right leg      Laboratory: Recent Labs  Lab 06/10/18 0350 06/11/18 0227 06/12/18 0331  WBC 14.2* 13.1* 11.0*  HGB 11.2* 10.5* 10.9*  HCT 34.7* 32.9* 33.9*  PLT 165 147* 147*   Recent Labs  Lab 06/07/18 0353  06/10/18 0350 06/11/18 0227 06/12/18 0331  NA 140   < > 136 133* 137  K 5.0   < > 5.1 5.4* 4.1  CL 95*   < > 94* 94* 94*  CO2 29   < > 22 21* 26  BUN 27*   < > 54* 70* 25*  CREATININE 5.35*   < > 6.66* 7.62* 4.19*  CALCIUM 9.3   < > 8.9 8.4* 8.5*  PROT 5.4*  --   --   --   --   BILITOT 0.9  --   --   --   --   ALKPHOS 150*  --   --   --   --   ALT 64*  --   --   --   --   AST 49*  --   --   --   --   GLUCOSE 137*   < > 134* 116* 127*   < > = values in this interval not displayed.    Imaging/Diagnostic Tests: No results found.  [place], DO 06/12/2018, 7:35 AM PGY-1, [place] Intern pager: [phone], text pages welcome

## 2018-06-13 LAB — RENAL FUNCTION PANEL
Albumin: 2.3 g/dL — ABNORMAL LOW (ref 3.5–5.0)
Anion gap: 13 (ref 5–15)
BUN: 43 mg/dL — ABNORMAL HIGH (ref 8–23)
CO2: 27 mmol/L (ref 22–32)
Calcium: 8.3 mg/dL — ABNORMAL LOW (ref 8.9–10.3)
Chloride: 95 mmol/L — ABNORMAL LOW (ref 98–111)
Creatinine, Ser: 5.9 mg/dL — ABNORMAL HIGH (ref 0.44–1.00)
GFR calc Af Amer: 8 mL/min — ABNORMAL LOW
GFR calc non Af Amer: 7 mL/min — ABNORMAL LOW
Glucose, Bld: 153 mg/dL — ABNORMAL HIGH (ref 70–99)
Phosphorus: 5 mg/dL — ABNORMAL HIGH (ref 2.5–4.6)
Potassium: 4 mmol/L (ref 3.5–5.1)
Sodium: 135 mmol/L (ref 135–145)

## 2018-06-13 LAB — GLUCOSE, CAPILLARY
Glucose-Capillary: 157 mg/dL — ABNORMAL HIGH (ref 70–99)
Glucose-Capillary: 270 mg/dL — ABNORMAL HIGH (ref 70–99)
Glucose-Capillary: 89 mg/dL (ref 70–99)
Glucose-Capillary: 277 mg/dL — ABNORMAL HIGH (ref 70–99)

## 2018-06-13 LAB — SARS CORONAVIRUS 2 BY RT PCR (HOSPITAL ORDER, PERFORMED IN ~~LOC~~ HOSPITAL LAB): SARS Coronavirus 2: NEGATIVE

## 2018-06-13 LAB — CBC
HCT: 33 % — ABNORMAL LOW (ref 36.0–46.0)
Hemoglobin: 10.5 g/dL — ABNORMAL LOW (ref 12.0–15.0)
MCH: 31.3 pg (ref 26.0–34.0)
MCHC: 31.8 g/dL (ref 30.0–36.0)
MCV: 98.2 fL (ref 80.0–100.0)
Platelets: 150 K/uL (ref 150–400)
RBC: 3.36 MIL/uL — ABNORMAL LOW (ref 3.87–5.11)
RDW: 20 % — ABNORMAL HIGH (ref 11.5–15.5)
WBC: 11.6 K/uL — ABNORMAL HIGH (ref 4.0–10.5)
nRBC: 0.3 % — ABNORMAL HIGH (ref 0.0–0.2)

## 2018-06-13 LAB — TROPONIN I: Troponin I: 0.25 ng/mL

## 2018-06-13 MED ORDER — INSULIN GLARGINE 100 UNIT/ML ~~LOC~~ SOLN
5.0000 [IU] | Freq: Every day | SUBCUTANEOUS | Status: DC
  Administered 2018-06-13 – 2018-06-14 (×2): 5 [IU] via SUBCUTANEOUS
  Filled 2018-06-13 (×2): qty 0.05

## 2018-06-13 MED ORDER — NITROGLYCERIN 0.4 MG SL SUBL: 0.4000 mg | SUBLINGUAL_TABLET | SUBLINGUAL | Status: DC | PRN

## 2018-06-13 MED ORDER — OXYCODONE HCL 5 MG PO TABS
ORAL_TABLET | ORAL | Status: AC
  Administered 2018-06-13: 2.5 mg via ORAL
  Filled 2018-06-13: qty 1

## 2018-06-13 MED ORDER — PANTOPRAZOLE SODIUM 40 MG PO TBEC
40.0000 mg | DELAYED_RELEASE_TABLET | Freq: Every day | ORAL | Status: DC
  Administered 2018-06-13: 40 mg via ORAL
  Filled 2018-06-13: qty 1

## 2018-06-13 NOTE — Progress Notes (Signed)
CRITICAL VALUE ALERT  Critical Value:  Troponin 0.25  Date & Time Notied:  05/29/2018 1846  Provider Notified: Family Medicine Pager  Orders Received/Actions taken:  [place]

## 2018-06-13 NOTE — Progress Notes (Addendum)
Family medicine interim progress note  Called to bedside after patient had 10 out of 10 chest pain with approximately 30 minutes less than dialysis session.  Per report this persisted for few minutes before dialysis session was aborted.  Patient reported that it feels like "elephant sitting on her chest".  When this provider arrived to bedside patient states that the chest pain is improved but is still significant.  It does feel a little bit like her previous episode of ACS.  Patient was stable and did not endorse any respiratory distress, but did look to be very uncomfortable.  Reviewed EKG which showed patient back in A. fib with RVR and more pronounced ST depressions in V2 and V3.  Would also appear to be mildly increased ST elevations in V1 and aVR.  Ordered stat troponin and sublingual nitro.  Discussed case with cardiology team.  They will review EKG and provide recommendations.  Appreciate their assistance.  Guadalupe Dawn MD PGY-2 Family Medicine Resident [place] Intern pager: [phone]

## 2018-06-13 NOTE — Progress Notes (Signed)
Developed RVR with ECG changes and angina at the end of dialysis. Symptoms were severe and relatively brief and have now resolved. Her BP was low, but not much different from her baseline. (often has DBP in 40s). I am not sure why, but her metoprolol succinate was scheduled for 4 PM, so she had not yet received it when this all happened.  It is very likely that she has significant new coronary stenoses >20 years after CABG, therefore areas of myocardium that will become ischemic if she is tachycardic. I do not think was an acute atherothrombotic event, but an equivalent of "stable angina" due to RVR. Will make sure her beta blocker is administered in AM and can increase the dose if necessary (again, a little reluctant due to her low DBP and the high likelihood of further drops in BP with HD). Will check on her in AM. Sanda Klein, MD, [facility] [phone] office [phone] pager

## 2018-06-13 NOTE — Procedures (Signed)
   I was present at this dialysis session, have reviewed the session itself and made  appropriate changes Kelly Splinter MD [place] pager [phone]   06/13/2018, 12:31 PM

## 2018-06-13 NOTE — Progress Notes (Signed)
[facility] Progress Note   Subjective: no c/o's, seen on HD  Objective Vitals:   06/13/18 1300 06/13/18 1330 06/13/18 1400 06/13/18 1430  BP: (!) 112/45 (!) 100/44 (!) 117/53 (!) 109/57  Pulse: 91 98 93 75  Resp:      Temp:      TempSrc:      SpO2:      Weight:      Height:        Physical Exam General: Ill appearing elderly female comfortable and no distress Heart: Tachy, irregular  Lungs: CTA anteriorly  Abdomen: soft NT Extremities: No sig edema;  Dialysis Access: LUE AVG +bruit    [facility] MWF  4h  2/2 bath  68kg  LUA AVG  350/500  Hep none Calcitriol 0.41mcg po/HD Last op hgb 11.8 No esa    Summary: Pt is a 73 y.o. yo female ESRD who was admitted on 06/06/2018 with right hip fracture   Assessment/Plan: 1. Hip fracture-  Mechanical fall- s/p intermedullary nail 5/15. Per ortho.  2. ESRD - MWF HD.  HD today. Up 4kg pre HD today. 3. Anemia- has not been an issue of late, no ESA as OP, Hb holding> 10 4. Secondary hyperparathyroidism- cont home calcitriol and auryxia- phos 8.3-  PO meds on hold for now 5. HTN/volume- BP good on diltiazem+ beta blocker with much better HR. 6. Afib-  Symptomatic with rapid rate previously, now better. Back on eliquis. Per primary and cards, troponin up but not determined to be significant.  +dilt / mtp 7.  Steal to left hand-  Vascular rec OP eval- hopefully could improve with banding and not need full ligation- no emergency   Kelly Splinter MD 06/13/2018, 2:59 PM    Additional Objective Labs: Basic Metabolic Panel: Recent Labs  Lab 06/11/18 0227 06/12/18 0331 06/13/18 0412  NA 133* 137 135  K 5.4* 4.1 4.0  CL 94* 94* 95*  CO2 21* 26 27  GLUCOSE 116* 127* 153*  BUN 70* 25* 43*  CREATININE 7.62* 4.19* 5.90*  CALCIUM 8.4* 8.5* 8.3*  PHOS 8.3* 5.2* 5.0*   CBC: Recent Labs  Lab 06/06/18 2059  06/09/18 0430 06/10/18 0350 06/11/18 0227 06/12/18 0331 06/13/18 0412  WBC 12.4*   < > 17.6* 14.2* 13.1*  11.0* 11.6*  NEUTROABS 8.4*  --   --   --   --   --   --   HGB 11.9*   < > 11.8* 11.2* 10.5* 10.9* 10.5*  HCT 38.4   < > 38.6 34.7* 32.9* 33.9* 33.0*  MCV 99.2   < > 100.8* 95.9 96.5 97.7 98.2  PLT 143*   < > 133* 165 147* 147* 150   < > = values in this interval not displayed.   Blood Culture    Component Value Date/Time   SDES BLOOD RIGHT ANTECUBITAL 10/29/2017 1125   SDES BLOOD RIGHT ANTECUBITAL 10/29/2017 1125   SPECREQUEST  10/29/2017 1125    BOTTLES DRAWN AEROBIC AND ANAEROBIC Blood Culture adequate volume   SPECREQUEST  10/29/2017 1125    BOTTLES DRAWN AEROBIC AND ANAEROBIC Blood Culture adequate volume   CULT  10/29/2017 1125    NO GROWTH 5 DAYS Performed at [facility], [place] [address] [place], [place] [address]    CULT  10/29/2017 1125    NO GROWTH 5 DAYS Performed at [facility], [place] [address] [place], [place] [address]    REPTSTATUS 11/03/2017 FINAL 10/29/2017 1125   REPTSTATUS  11/03/2017 FINAL 10/29/2017 1125     Medications: . sodium chloride     . acetaminophen  650 mg Oral Q6H  . allopurinol  50 mg Oral Daily  . apixaban  5 mg Oral BID  . calcitRIOL  0.25 mcg Oral Q M,W,F-HD  . Chlorhexidine Gluconate Cloth  6 each Topical Q0600  . docusate sodium  100 mg Oral BID  . ferric citrate  420 mg Oral TID WC  . insulin aspart  0-9 Units Subcutaneous Q4H  . insulin glargine  5 Units Subcutaneous Daily  . metoprolol succinate  25 mg Oral Daily  . pantoprazole  40 mg Oral QHS  . PARoxetine  40 mg Oral Daily  . polyethylene glycol  17 g Oral Daily  . rosuvastatin  20 mg Oral Daily

## 2018-06-13 NOTE — Progress Notes (Signed)
At hemodialysis completion time while rinsing pt. Back, pt. Complained of chest discomfort stating "feels like a herd of elephants are siting on chest. Pt. In A.Fib as noted on telemetry monitor HR 110-120's. Veneta Penton PA made aware at pt. Bedside at [facility] #5. Orders for EKG and attending physcian paged and notified. EKG performed MD and PA reviewed results ok to transfer pt. Back to room. Pt stable. Pt primary nurse Philippa Sicks made aware of events.

## 2018-06-13 NOTE — Progress Notes (Addendum)
Family Medicine Teaching Service Daily Progress Note Intern Pager: [phone]  Patient name: Christine Santos Medical record number: [id number] Date of birth: 03/11/1945 Age: 73 y.o. Gender: female  Primary Care Provider: [name], MD Consultants: Orthopedics Code Status: Full code  Pt Overview and Major Events to Date:  5/13: admitted to [facility] for R femur fracture 5/15: ORIF of right femur  Assessment and Plan: Christine Santos is a 73 y.o. female who presented after a fall and subsequent displaced intertrochanteric fracture of right femur. PMHx significant for IBS, reflux, diabetic neuropathy, IDDM, paroxysmal A. fib on Eliquis, HTN, systolic heart failure, PVD, sinus bradycardia, CAD, stress incontinence, ESRD on HD MWF, secondary hyperparathyroidism, ACD.  Post-OP Ileus: Improved  Postop ileus seen on abdominal x-ray 5/17.  Advanced to clear liquid and full liquid diet yesterday, tolerated well.  This a.m. has good bowel sounds on exam and nurses reports BMs, but pt doesn't remember, endorses "I'm getting a little forgetful here." - advance to heart healthy carb modified  - compazine prn nausea given prolonged QTc   A.Fib  Prolonged QTc on EKG: Home meds: Metoprolol XL 25mg  QD, Eliquis 5mg .  RVR was likely secondary to increased pain and postop ileus.  Now resolved.  Cardiology has signed off, patient to continue metoprolol 25 mg daily.  Heart rate 83-93 overnight.  This a.m. 93. -Cardiology consulted, appreciate recommendations, have signed off - Continue Metoprolol 25mg  QD - Cont Eliquis  - Telemetry - Avoid QT prolonging agents  Acute Hypoxic Respiratory Failure following surgery: Improving Patient weaned to room air yesterday, was placed on 1 L per nasal cannula overnight, no desaturation noted in chart.  This a.m. on RA and breathing comfortably.  - O2 prn - wean as tolerated - ambulate with pulse ox, may require prn O2 on d/c  Right femur fracture s/p ORIF: POD 5 s/p  ORIF right femur, doing well.  Patient on scheduled Tylenol and received 2 PRN's of oxycodone yesterday.  Patient is agreeable to SNF, social work has been consulted. -PT/OT: SNF, SW consulted - Ortho consulted, appreciate care and recs - f/u outpatient 2 weeks for staple removal - cont tylenol PO q6h sch - oxy IR 2.5mg  q6h prn - continue to hold home PRN tramadol - fall precautions - incentive spirometry, O2 PRN  - weight bearing as tolerated, up with assistance - reinforce dressing  Concern for LUE Ischemia: Initial concern as well as cold to palpation, duplex with flow to left hand but reduced consistent with steal syndrome.  Patient seen by vascular surgery who noted that given her deconditioned state and chronicity of symptoms, will follow-up as outpatient in a few weeks. -Follow-up VS as outpatient  ESRD with HD MWF  Anemia of Chronic Disease  2` Hyperparathyroidism: HD today.  GFR 7.  K4.0. - HD and electrolytes per nephro - continue to monitor K+ - Daily RFP - Continue home calcitriol and Auryxia - Renal diet - Avoid continuous fluids  IDDM: Home meds: Lantus 12U and Humalog 5U TID with meals.  Patient had 2 meals yesterday, received 6 units of insulin per sliding scale.  Lantus held due to n.p.o. and hypoglycemia night prior.  CBG this a.m. 157, CBG 230 at 2122. - sSSI -Start Lantus 5 units daily - CBG with meals  HTN:  BP 120/47.  Home meds: Hydralazine 50mg  QD and Metoprolol 25mg  QD - Continue Metoprolol  - Continue to hold home hydral given soft BP's  CHF:  Last echo 2017, EF  55-60%. Home meds: Metoprolol XL 25mg  QD.  - Continue Metoprolol  Depression: Home meds: Paxil 40mg  QD - continue home meds  HLD:  - Continue home Rosuvastatin 20mg   GERD:  - Continue home Protonix 40mg   Gout:  - Restart home Allopurinol 50mg  QD  Insomnia:  - Can restart home Melatonin 10mg  qHS PRN for sleep if requested  FEN/GI: Renal diet PPx: Lovenox  Disposition: SNF,  medically stable for discharge  Subjective:  Patient states that she is feeling well this morning.  She states that she is becoming somewhat more forgetful while she is in the hospital, but otherwise has no complaints.  Patient continues to be agreeable to SNF.  Objective: Temp:  [97.5 F (36.4 C)-98.1 F (36.7 C)] 98.1 F (36.7 C) (05/19 2340) Pulse Rate:  [68-95] 92 (05/20 0400) Resp:  [15-19] 15 (05/20 0400) BP: (96-140)/(38-52) 120/47 (05/20 0400) SpO2:  [90 %-100 %] 100 % (05/20 0400) Weight:  [72.3 kg] 72.3 kg (05/20 0500)  Physical Exam:  General: 73 y.o. female in NAD Cardio: RRR no m/r/g Lungs: CTAB, no wheezing, no rhonchi, no crackles, no IWOB on room air Abdomen: Soft, non-tender to palpation, non-distended, positive bowel sounds Skin: warm and dry Extremities: No edema, bandages on right leg, no surrounding erythema    Laboratory: Recent Labs  Lab 06/11/18 0227 06/12/18 0331 06/13/18 0412  WBC 13.1* 11.0* 11.6*  HGB 10.5* 10.9* 10.5*  HCT 32.9* 33.9* 33.0*  PLT 147* 147* 150   Recent Labs  Lab 06/07/18 0353  06/11/18 0227 06/12/18 0331 06/13/18 0412  NA 140   < > 133* 137 135  K 5.0   < > 5.4* 4.1 4.0  CL 95*   < > 94* 94* 95*  CO2 29   < > 21* 26 27  BUN 27*   < > 70* 25* 43*  CREATININE 5.35*   < > 7.62* 4.19* 5.90*  CALCIUM 9.3   < > 8.4* 8.5* 8.3*  PROT 5.4*  --   --   --   --   BILITOT 0.9  --   --   --   --   ALKPHOS 150*  --   --   --   --   ALT 64*  --   --   --   --   AST 49*  --   --   --   --   GLUCOSE 137*   < > 116* 127* 153*   < > = values in this interval not displayed.    Imaging/Diagnostic Tests: No results found.  Bear Osten, Bernita Raisin, DO 06/13/2018, 7:19 AM PGY-1, [facility] Intern pager: [phone], text pages welcome

## 2018-06-13 NOTE — Progress Notes (Signed)
[facility] Renal Quick Note:  Pt reports 10/10 CP with ~30 min left of HD. Says "elephant on my chest." Rinsed back + 227mL more saline given. O2 applied 2l/min. Minimal improvement in pain.  Known CAD - Hx CABG. Does not require NTG at home.  EKG ordered stat ; A-fib RVR per my read. Metoprolol due at 4p - will have RN tube up and give now.  Pager Fam Med service to assess her.  Veneta Penton, PA-C [employer] Pager [phone]

## 2018-06-13 NOTE — Progress Notes (Signed)
Physical Therapy Treatment Patient Details Name: Christine Santos MRN: [id number] DOB: 1945-02-05 Today's Date: 06/13/2018    History of Present Illness Pt is a 73 y.o. female presenting with right hip fracture after a fall at home Now s/p IM nail RLE. PMH is significant for atrial fibrillation on Eliquis, chronic anemia, ESRD on HD, T2DM on insulin, HTN, HLD, GERD, CHF, gout.    PT Comments    Patient was able to transfer ot the edge of the bed with mod a but then became syncopal. She had a drop in her blood pressure. Her blood pressure was re-checked after several minutes and remained low  144/35. He was    Follow Up Recommendations  SNF;Supervision for mobility/OOB;Supervision/Assistance - 24 hour     Equipment Recommendations  None recommended by PT    Recommendations for Other Services Rehab consult     Precautions / Restrictions Restrictions Weight Bearing Restrictions: Yes RLE Weight Bearing: Weight bearing as tolerated    Mobility  Bed Mobility Overal bed mobility: Needs Assistance Bed Mobility: Supine to Sit;Sit to Supine Rolling: Mod assist   Supine to sit: Mod assist Sit to supine: Mod assist;+2 for physical assistance;+2 for safety/equipment   General bed mobility comments: Mod A +1 to the edge of the bed. She required min a to move the left leg to the edge of the bed. She required mod a to scoot her hips. The patient sat at the edge of bed for aprox 3 minutes. At that time she reported increasing syncope. She was advised to try to breath through her nose but the syncope increased. She was trasfered back to a supine position with mod a.  B/P noted at 149/38 after returning to bed. Baseline was 144/66; after several minutes the patients B/P was measured at 144/35  Transfers                 General transfer comment: unable to transfer 2nd to syncope  Ambulation/Gait                 Stairs             Wheelchair Mobility    Modified Rankin  (Stroke Patients Only)       Balance Overall balance assessment: Needs assistance Sitting-balance support: Feet supported;Bilateral upper extremity supported Sitting balance-Leahy Scale: Poor Sitting balance - Comments:  prior to patient becoming syncopal she required min a and min guard to remain sititng .                                     Cognition Arousal/Alertness: Awake/alert Behavior During Therapy: WFL for tasks assessed/performed Overall Cognitive Status: No family/caregiver present to determine baseline cognitive functioning                                 General Comments: Patient was Aox3 this morning even noting that the calander was on the wrong day.      Exercises General Exercises - Lower Extremity Ankle Circles/Pumps: 20 reps Quad Sets: 20 reps(2x10 cuing for technique ) Gluteal Sets: 20 reps(2x10 with cuing ) Heel Slides: 10 reps(assisted with mod cuing )    General Comments        Pertinent Vitals/Pain Pain Assessment: 0-10 Pain Score: 7  Pain Location: right hip Pain Descriptors / Indicators: Sore;Aching;Guarding Pain Intervention(s): Limited activity  within patient's tolerance;Monitored during session;Repositioned;Premedicated before session    Home Living                      Prior Function            PT Goals (current goals can now be found in the care plan section) Acute Rehab PT Goals Patient Stated Goal: to get better, go to rehab and then home PT Goal Formulation: With patient Progress towards PT goals: Progressing toward goals    Frequency    Min 3X/week      PT Plan Current plan remains appropriate    Co-evaluation              AM-PAC PT "6 Clicks" Mobility   Outcome Measure  Help needed turning from your back to your side while in a flat bed without using bedrails?: A Little Help needed moving from lying on your back to sitting on the side of a flat bed without using bedrails?:  A Lot Help needed moving to and from a bed to a chair (including a wheelchair)?: A Lot Help needed standing up from a chair using your arms (e.g., wheelchair or bedside chair)?: A Lot Help needed to walk in hospital room?: A Lot Help needed climbing 3-5 steps with a railing? : Total 6 Click Score: 12    End of Session Equipment Utilized During Treatment: Gait belt Activity Tolerance: Patient tolerated treatment well Patient left: with call bell/phone within reach;in bed Nurse Communication: Mobility status PT Visit Diagnosis: Pain;Difficulty in walking, not elsewhere classified (R26.2);Unsteadiness on feet (R26.81);History of falling (Z91.81) Pain - Right/Left: Right Pain - part of body: Leg     Time: 7782-4235 PT Time Calculation (min) (ACUTE ONLY): 25 min  Charges:  $Therapeutic Exercise: 8-22 mins $Therapeutic Activity: 8-22 mins                        Carney Living PT DPT  06/13/2018, 9:55 AM

## 2018-06-14 ENCOUNTER — Other Ambulatory Visit: Payer: Self-pay | Admitting: Cardiology

## 2018-06-14 DIAGNOSIS — S72001A Fracture of unspecified part of neck of right femur, initial encounter for closed fracture: Secondary | ICD-10-CM | POA: Diagnosis not present

## 2018-06-14 DIAGNOSIS — L89153 Pressure ulcer of sacral region, stage 3: Secondary | ICD-10-CM | POA: Diagnosis not present

## 2018-06-14 DIAGNOSIS — M25541 Pain in joints of right hand: Secondary | ICD-10-CM | POA: Diagnosis not present

## 2018-06-14 DIAGNOSIS — S72141A Displaced intertrochanteric fracture of right femur, initial encounter for closed fracture: Secondary | ICD-10-CM | POA: Diagnosis not present

## 2018-06-14 DIAGNOSIS — R41841 Cognitive communication deficit: Secondary | ICD-10-CM | POA: Diagnosis not present

## 2018-06-14 DIAGNOSIS — S72141D Displaced intertrochanteric fracture of right femur, subsequent encounter for closed fracture with routine healing: Secondary | ICD-10-CM | POA: Diagnosis not present

## 2018-06-14 DIAGNOSIS — R1312 Dysphagia, oropharyngeal phase: Secondary | ICD-10-CM | POA: Diagnosis not present

## 2018-06-14 DIAGNOSIS — N2581 Secondary hyperparathyroidism of renal origin: Secondary | ICD-10-CM | POA: Diagnosis not present

## 2018-06-14 DIAGNOSIS — R2689 Other abnormalities of gait and mobility: Secondary | ICD-10-CM | POA: Diagnosis not present

## 2018-06-14 DIAGNOSIS — G4700 Insomnia, unspecified: Secondary | ICD-10-CM | POA: Diagnosis not present

## 2018-06-14 DIAGNOSIS — M25552 Pain in left hip: Secondary | ICD-10-CM | POA: Diagnosis not present

## 2018-06-14 DIAGNOSIS — I5022 Chronic systolic (congestive) heart failure: Secondary | ICD-10-CM | POA: Diagnosis not present

## 2018-06-14 DIAGNOSIS — R279 Unspecified lack of coordination: Secondary | ICD-10-CM | POA: Diagnosis not present

## 2018-06-14 DIAGNOSIS — R52 Pain, unspecified: Secondary | ICD-10-CM | POA: Diagnosis not present

## 2018-06-14 DIAGNOSIS — D509 Iron deficiency anemia, unspecified: Secondary | ICD-10-CM | POA: Diagnosis not present

## 2018-06-14 DIAGNOSIS — C9110 Chronic lymphocytic leukemia of B-cell type not having achieved remission: Secondary | ICD-10-CM | POA: Diagnosis not present

## 2018-06-14 DIAGNOSIS — Z992 Dependence on renal dialysis: Secondary | ICD-10-CM | POA: Diagnosis not present

## 2018-06-14 DIAGNOSIS — R079 Chest pain, unspecified: Secondary | ICD-10-CM

## 2018-06-14 DIAGNOSIS — I120 Hypertensive chronic kidney disease with stage 5 chronic kidney disease or end stage renal disease: Secondary | ICD-10-CM | POA: Diagnosis not present

## 2018-06-14 DIAGNOSIS — I132 Hypertensive heart and chronic kidney disease with heart failure and with stage 5 chronic kidney disease, or end stage renal disease: Secondary | ICD-10-CM | POA: Diagnosis not present

## 2018-06-14 DIAGNOSIS — M25551 Pain in right hip: Secondary | ICD-10-CM | POA: Diagnosis not present

## 2018-06-14 DIAGNOSIS — F419 Anxiety disorder, unspecified: Secondary | ICD-10-CM | POA: Diagnosis not present

## 2018-06-14 DIAGNOSIS — F329 Major depressive disorder, single episode, unspecified: Secondary | ICD-10-CM | POA: Diagnosis not present

## 2018-06-14 DIAGNOSIS — M6281 Muscle weakness (generalized): Secondary | ICD-10-CM | POA: Diagnosis not present

## 2018-06-14 DIAGNOSIS — R7989 Other specified abnormal findings of blood chemistry: Secondary | ICD-10-CM | POA: Diagnosis not present

## 2018-06-14 DIAGNOSIS — Z4789 Encounter for other orthopedic aftercare: Secondary | ICD-10-CM | POA: Diagnosis not present

## 2018-06-14 DIAGNOSIS — N186 End stage renal disease: Secondary | ICD-10-CM | POA: Diagnosis not present

## 2018-06-14 DIAGNOSIS — D631 Anemia in chronic kidney disease: Secondary | ICD-10-CM | POA: Diagnosis not present

## 2018-06-14 DIAGNOSIS — M79642 Pain in left hand: Secondary | ICD-10-CM | POA: Diagnosis not present

## 2018-06-14 DIAGNOSIS — S7291XD Unspecified fracture of right femur, subsequent encounter for closed fracture with routine healing: Secondary | ICD-10-CM | POA: Diagnosis not present

## 2018-06-14 DIAGNOSIS — E876 Hypokalemia: Secondary | ICD-10-CM | POA: Diagnosis not present

## 2018-06-14 DIAGNOSIS — I480 Paroxysmal atrial fibrillation: Secondary | ICD-10-CM | POA: Diagnosis not present

## 2018-06-14 DIAGNOSIS — E118 Type 2 diabetes mellitus with unspecified complications: Secondary | ICD-10-CM | POA: Diagnosis not present

## 2018-06-14 DIAGNOSIS — I482 Chronic atrial fibrillation, unspecified: Secondary | ICD-10-CM | POA: Diagnosis not present

## 2018-06-14 DIAGNOSIS — E1122 Type 2 diabetes mellitus with diabetic chronic kidney disease: Secondary | ICD-10-CM | POA: Diagnosis not present

## 2018-06-14 LAB — RENAL FUNCTION PANEL
Albumin: 2.5 g/dL — ABNORMAL LOW (ref 3.5–5.0)
Anion gap: 10 (ref 5–15)
BUN: 20 mg/dL (ref 8–23)
CO2: 32 mmol/L (ref 22–32)
Calcium: 8.4 mg/dL — ABNORMAL LOW (ref 8.9–10.3)
Chloride: 97 mmol/L — ABNORMAL LOW (ref 98–111)
Creatinine, Ser: 3.78 mg/dL — ABNORMAL HIGH (ref 0.44–1.00)
GFR calc Af Amer: 13 mL/min — ABNORMAL LOW (ref >60)
GFR calc non Af Amer: 11 mL/min — ABNORMAL LOW (ref >60)
Glucose, Bld: 132 mg/dL — ABNORMAL HIGH (ref 70–99)
Phosphorus: 3 mg/dL (ref 2.5–4.6)
Potassium: 4.2 mmol/L (ref 3.5–5.1)
Sodium: 139 mmol/L (ref 135–145)

## 2018-06-14 LAB — CBC
HCT: 33.8 % — ABNORMAL LOW (ref 36.0–46.0)
Hemoglobin: 10.9 g/dL — ABNORMAL LOW (ref 12.0–15.0)
MCH: 31.6 pg (ref 26.0–34.0)
MCHC: 32.2 g/dL (ref 30.0–36.0)
MCV: 98 fL (ref 80.0–100.0)
Platelets: 149 10*3/uL — ABNORMAL LOW (ref 150–400)
RBC: 3.45 MIL/uL — ABNORMAL LOW (ref 3.87–5.11)
RDW: 20.6 % — ABNORMAL HIGH (ref 11.5–15.5)
WBC: 13.2 10*3/uL — ABNORMAL HIGH (ref 4.0–10.5)
nRBC: 0.2 % (ref 0.0–0.2)

## 2018-06-14 LAB — GLUCOSE, CAPILLARY
Glucose-Capillary: 120 mg/dL — ABNORMAL HIGH (ref 70–99)
Glucose-Capillary: 139 mg/dL — ABNORMAL HIGH (ref 70–99)
Glucose-Capillary: 184 mg/dL — ABNORMAL HIGH (ref 70–99)

## 2018-06-14 MED ORDER — CHLORHEXIDINE GLUCONATE CLOTH 2 % EX PADS: 6.0000 | MEDICATED_PAD | Freq: Every day | CUTANEOUS | Status: DC

## 2018-06-14 MED ORDER — INSULIN GLARGINE 100 UNIT/ML ~~LOC~~ SOLN
7.0000 [IU] | Freq: Every day | SUBCUTANEOUS | Status: DC
  Filled 2018-06-14: qty 0.07

## 2018-06-14 MED ORDER — CALCITRIOL 0.25 MCG PO CAPS: 0.2500 ug | ORAL_CAPSULE | ORAL | Status: DC

## 2018-06-14 MED ORDER — FERRIC CITRATE 1 GM 210 MG(FE) PO TABS: 420.0000 mg | ORAL_TABLET | Freq: Three times a day (TID) | ORAL | Status: DC

## 2018-06-14 MED ORDER — OXYCODONE HCL 5 MG PO TABS: 2.5000 mg | ORAL_TABLET | Freq: Four times a day (QID) | ORAL | 0 refills | Status: AC | PRN

## 2018-06-14 MED ORDER — INSULIN GLARGINE 100 UNIT/ML ~~LOC~~ SOLN: 12.0000 [IU] | Freq: Every day | SUBCUTANEOUS | Status: DC

## 2018-06-14 MED ORDER — NITROGLYCERIN 0.4 MG SL SUBL: 0.4000 mg | SUBLINGUAL_TABLET | SUBLINGUAL | 12 refills | Status: DC | PRN

## 2018-06-14 NOTE — TOC Transition Note (Signed)
Transition of Care [facility]) - CM/SW Discharge Note   Patient Details  Name: Christine Santos MRN: [id number] Date of Birth: 15-Jul-1945  [facility] CM/SW Contact:  Eileen Stanford, LCSW Phone Number: 06/14/2018, 2:18 PM   Clinical Narrative:   Clinical Social Worker facilitated patient discharge including contacting patient family and facility to confirm patient discharge plans.  Clinical information faxed to facility and family agreeable with plan.  CSW arranged ambulance transport via [facility] to [facility] .  RN to call [phone] for report prior to discharge.     Final next level of care: Skilled Nursing Facility Barriers to Discharge: No Barriers Identified   Patient Goals and CMS Choice Patient states their goals for this hospitalization and ongoing recovery are:: get back to walking and driving  CMS Medicare.gov Compare Post Acute Care list provided to:: Patient Choice offered to / list presented to : Patient  Discharge Placement              Patient [facility] bed at: [facility] Patient to be transferred to facility by: [place] Name of family member notified: Pt alert and oriented Patient and family notified of of transfer: 06/14/18  Discharge Plan and Services   Discharge Planning Services: CM Consult                                 Social Determinants of Health ([place]) Interventions     Readmission Risk Interventions Readmission Risk Prevention Plan 06/12/2018  Transportation Screening Complete  Medication Review [employer]) Complete  PCP or Specialist appointment within 3-5 days of discharge Complete  HRI or Home Care Consult Complete  SW Recovery Care/Counseling Consult Complete  Palliative Care Screening Not Applicable  Skilled Nursing Facility Complete  Some recent data might be hidden

## 2018-06-14 NOTE — Discharge Instructions (Signed)
Patient was admitted to the hospital for her right intertrochanteric hip fracture.  She had surgery on 5/15.  Patient developed postop ileus, that resolved without NG placement.  She is now tolerating a p.o. diet well.  Patient also briefly developed A. fib with RVR during her hospitalization, she has a history of known A. fib.  This improved putting patient back on her home dose of metoprolol.  She should be seen by Ortho 2 weeks from her surgical date for suture removal and follow-up.     1. Change dressings as needed 2. May shower but keep incisions covered and dry 3. Take eliquis to prevent blood clots 4. Take stool softeners as needed 5. Take pain meds as needed

## 2018-06-14 NOTE — Progress Notes (Signed)
[facility] Progress Note   Subjective: had afib / RVR w/ chest pain post HD yest. In AFib this am, HR better in 80's.   Objective Vitals:   06/13/18 2324 06/13/18 2328 06/14/18 0454 06/14/18 0500  BP: (!) 104/47 (!) 121/40 (!) 141/32   Pulse: 85 (!) 108 (!) 101   Resp: (!) 23 (!) 22 17   Temp: 98.8 F (37.1 C)  97.7 F (36.5 C)   TempSrc: Axillary  Oral   SpO2: 94% (!) 84% 99%   Weight:    71.7 kg  Height:        Physical Exam General: Ill appearing elderly female comfortable and no distress Heart: Tachy, irregular  Lungs: CTA anteriorly  Abdomen: soft NT Extremities: No sig edema;  Dialysis Access: LUE AVG +bruit    [facility] MWF  4h  2/2 bath  68kg  LUA AVG  350/500  Hep none Calcitriol 0.99mcg po/HD Last op hgb 11.8 No esa    Summary: Pt is a 73 y.o. yo female ESRD who was admitted on 06/06/2018 with right hip fracture   Assessment/Plan: 1. Hip fracture-  Mechanical fall- s/p intermedullary nail 5/15. Per ortho.  2. ESRD - MWF HD.  HD Friday, up several kg 3. Anemia- has not been an issue of late, no ESA as OP, Hb holding> 10 4. Secondary hyperparathyroidism- cont home calcitriol and auryxia- phos 8.3-  PO meds on hold for now 5. HTN/volume- BP good on diltiazem+ beta blocker with much better HR. 6. Afib/RVR - heart rate better today. Po dilt/ mtp. Back on eliquis. Per primary and cards.  7.  Steal to left hand-  Vascular rec OP eval- hopefully could improve with banding and not need full ligation- no emergency   Kelly Splinter MD 06/14/2018, 8:56 AM    Additional Objective Labs: Basic Metabolic Panel: Recent Labs  Lab 06/12/18 0331 06/13/18 0412 06/14/18 0257  NA 137 135 139  K 4.1 4.0 4.2  CL 94* 95* 97*  CO2 26 27 32  GLUCOSE 127* 153* 132*  BUN 25* 43* 20  CREATININE 4.19* 5.90* 3.78*  CALCIUM 8.5* 8.3* 8.4*  PHOS 5.2* 5.0* 3.0   CBC: Recent Labs  Lab 06/10/18 0350 06/11/18 0227 06/12/18 0331 06/13/18 0412 06/14/18 0257   WBC 14.2* 13.1* 11.0* 11.6* 13.2*  HGB 11.2* 10.5* 10.9* 10.5* 10.9*  HCT 34.7* 32.9* 33.9* 33.0* 33.8*  MCV 95.9 96.5 97.7 98.2 98.0  PLT 165 147* 147* 150 149*   Blood Culture    Component Value Date/Time   SDES BLOOD RIGHT ANTECUBITAL 10/29/2017 1125   SDES BLOOD RIGHT ANTECUBITAL 10/29/2017 1125   SPECREQUEST  10/29/2017 1125    BOTTLES DRAWN AEROBIC AND ANAEROBIC Blood Culture adequate volume   SPECREQUEST  10/29/2017 1125    BOTTLES DRAWN AEROBIC AND ANAEROBIC Blood Culture adequate volume   CULT  10/29/2017 1125    NO GROWTH 5 DAYS Performed at [facility], [place] [address], [place], [place] [address]    CULT  10/29/2017 1125    NO GROWTH 5 DAYS Performed at [facility], [place] [address] [place], [place] [address]    REPTSTATUS 11/03/2017 FINAL 10/29/2017 1125   REPTSTATUS 11/03/2017 FINAL 10/29/2017 1125     Medications: . sodium chloride     . acetaminophen  650 mg Oral Q6H  . allopurinol  50 mg Oral Daily  . apixaban  5 mg Oral BID  . calcitRIOL  0.25 mcg Oral Q M,W,F-HD  .  Chlorhexidine Gluconate Cloth  6 each Topical Q0600  . docusate sodium  100 mg Oral BID  . ferric citrate  420 mg Oral TID WC  . insulin aspart  0-9 Units Subcutaneous Q4H  . insulin glargine  5 Units Subcutaneous Daily  . metoprolol succinate  25 mg Oral Daily  . pantoprazole  40 mg Oral QHS  . PARoxetine  40 mg Oral Daily  . polyethylene glycol  17 g Oral Daily  . rosuvastatin  20 mg Oral Daily

## 2018-06-14 NOTE — Progress Notes (Signed)
Renal Navigator received notification from CSW/B. Cobb that patient will be discharged to SNF/[facility] today and needs OP HD seat time provided to SNF staff. Renal Navigator informed CSW that patient's seat time 6:20am on MWF. Renal Navigator notified OP HD clinic/[facility] of plan for discharge today to SNF to provide continuity of care and assist with smooth transition from hospital back to OP HD clinic.  Alphonzo Cruise, [place]  Renal Navigator [phone]

## 2018-06-14 NOTE — Progress Notes (Signed)
SATURATION QUALIFICATIONS: (This note is used to comply with regulatory documentation for home oxygen)  Patient Saturations on Room Air at Rest = 100%  Patient Saturations on Room Air while Ambulating 8-10 ft = 100%  Patient Saturations on 0 Liters of oxygen while Ambulating = 100%  Please briefly explain why patient needs home oxygen:  n/a

## 2018-06-14 NOTE — Progress Notes (Signed)
Progress Note  Patient Name: Christine Santos Date of Encounter: 06/14/2018  Primary Cardiologist: Larae Grooms, MD   Subjective   No further angina overnight or walking with PT today. Fair rate control on telemetry. Troponin lower than earlier during this admission.  Inpatient Medications    Scheduled Meds: . acetaminophen  650 mg Oral Q6H  . allopurinol  50 mg Oral Daily  . apixaban  5 mg Oral BID  . calcitRIOL  0.25 mcg Oral Q M,W,F-HD  . Chlorhexidine Gluconate Cloth  6 each Topical Q0600  . docusate sodium  100 mg Oral BID  . ferric citrate  420 mg Oral TID WC  . insulin aspart  0-9 Units Subcutaneous Q4H  . [START ON 06/15/2018] insulin glargine  12 Units Subcutaneous Daily  . insulin glargine  7 Units Subcutaneous Daily  . metoprolol succinate  25 mg Oral Daily  . pantoprazole  40 mg Oral QHS  . PARoxetine  40 mg Oral Daily  . polyethylene glycol  17 g Oral Daily  . rosuvastatin  20 mg Oral Daily   Continuous Infusions: . sodium chloride     PRN Meds: menthol-cetylpyridinium **OR** phenol, methocarbamol **OR** [DISCONTINUED] methocarbamol (ROBAXIN) IV, nitroGLYCERIN, oxyCODONE, prochlorperazine, sorbitol   Vital Signs    Vitals:   06/14/18 0454 06/14/18 0500 06/14/18 1029 06/14/18 1135  BP: (!) 141/32  (!) 116/45 (!) 138/57  Pulse: (!) 101  87 90  Resp: 17   17  Temp: 97.7 F (36.5 C)   98 F (36.7 C)  TempSrc: Oral   Oral  SpO2: 99%   99%  Weight:  71.7 kg    Height:        Intake/Output Summary (Last 24 hours) at 06/14/2018 1142 Last data filed at 06/13/2018 1921 Gross per 24 hour  Intake 250 ml  Output 1522 ml  Net -1272 ml   Last 3 Weights 06/14/2018 06/13/2018 06/13/2018  Weight (lbs) 158 lb 1.6 oz 158 lb 1.1 oz 159 lb 2.8 oz  Weight (kg) 71.714 kg 71.7 kg 72.2 kg      Telemetry    AF, controlled rate - Personally Reviewed  ECG    No new tracing - Personally Reviewed  Physical Exam  Chronically ill appearing, pale GEN: No acute  distress.   Neck: No JVD Cardiac: RRR, no murmurs, rubs, or gallops.  Respiratory: Clear to auscultation bilaterally. GI: Soft, nontender, non-distended  MS: No edema; No deformity. Neuro:  Nonfocal  Psych: Normal affect   Labs    Chemistry Recent Labs  Lab 06/12/18 0331 06/13/18 0412 06/14/18 0257  NA 137 135 139  K 4.1 4.0 4.2  CL 94* 95* 97*  CO2 26 27 32  GLUCOSE 127* 153* 132*  BUN 25* 43* 20  CREATININE 4.19* 5.90* 3.78*  CALCIUM 8.5* 8.3* 8.4*  ALBUMIN 2.5* 2.3* 2.5*  GFRNONAA 10* 7* 11*  GFRAA 11* 8* 13*  ANIONGAP 17* 13 10     Hematology Recent Labs  Lab 06/12/18 0331 06/13/18 0412 06/14/18 0257  WBC 11.0* 11.6* 13.2*  RBC 3.47* 3.36* 3.45*  HGB 10.9* 10.5* 10.9*  HCT 33.9* 33.0* 33.8*  MCV 97.7 98.2 98.0  MCH 31.4 31.3 31.6  MCHC 32.2 31.8 32.2  RDW 20.1* 20.0* 20.6*  PLT 147* 150 149*    Cardiac Enzymes Recent Labs  Lab 06/10/18 1147 06/10/18 1702 06/11/18 0006 06/13/18 1659  TROPONINI 0.81* 0.62* 0.48* 0.25*   No results for input(s): TROPIPOC in the last 168 hours.  BNPNo results for input(s): BNP, PROBNP in the last 168 hours.   DDimer No results for input(s): DDIMER in the last 168 hours.   Radiology    No results found.  Cardiac Studies   Echo 09/04/2015  Study Conclusions  - Left ventricle: The cavity size was normal. Wall thickness was increased in a pattern of mild LVH. Systolic function was normal. The estimated ejection fraction was in the range of 55% to 60%. Wall motion was normal; there were no regional wall motion abnormalities. Doppler parameters are consistent with abnormal left ventricular relaxation (grade 1 diastolic dysfunction). Doppler parameters are consistent with high ventricular filling pressure. - Mitral valve: Calcified annulus.  Impressions:  - Normal LV systolic function; mild LVH; grade 1 diastolic dysfunction with elevated LV filling pressure.  Patient Profile     73  y.o. female with PMH of IDDM, chronic atrial fibrillation on eliquis, HTN, CAD s/p CABG 1996, ESRD on HD MWF who presented with a fall and found to have displaced intertrochanteric fracture of R femur. Cardiology consulted for managed of atrial fibrillation.  Assessment & Plan    1. Chronic Atrial Fibrillation: rate controlled w/ Metoprolol. On Eliquis for a/c. Continues to have remarkably low diastolic BP, which makes her more vulnerable to ischemia/angina during tachycardia.  2. CAD: h/o CABG in 1996. Had chest pain at the end of HD yesterday, when ventricular rate was > 130. No recurrence since then. Continue medical management.  Suspect there has been progression of native or graft disease, but doubt a true acute coronary event. Consider outpatient nuclear stress test after recovery from hip fracture.  3. CKD: on HD. Management per nephrology.   4. Elevated Troponin:  chronic, poor prognostic sign, but not c/w acute coronary event.   5. HLD: on statin with LDL at goal<70  6. Femur Fx: s/p IM nail 06/08/18. Post op care/ PT progression per ortho.   7. DM: per primary  [facility] will sign off.   Medication Recommendations:  Continue current meds Other recommendations (labs, testing, etc):  Lexiscan Myoview in 3-4 weeks after dc/rehab Follow up as an outpatient:  Will arrange follow up with Dr. Irish Lack.  For questions or updates, please contact [place] Please consult [email or web address] for contact info under        Signed, Sanda Klein, MD  06/14/2018, 11:42 AM

## 2018-06-14 NOTE — Progress Notes (Addendum)
Family Medicine Teaching Service Daily Progress Note Intern Pager: [phone]  Patient name: Christine Santos Medical record number: [id number] Date of birth: Jun 09, 1945 Age: 73 y.o. Gender: female  Primary Care Provider: [name], MD Consultants: Orthopedics Code Status: Full code  Pt Overview and Major Events to Date:  5/13: admitted to [facility] for R femur fracture 5/15: ORIF of right femur  Assessment and Plan: Christine Santos is a 73 y.o. female who presented after a fall and subsequent displaced intertrochanteric fracture of right femur. PMHx significant for IBS, reflux, diabetic neuropathy, IDDM, paroxysmal A. fib on Eliquis, HTN, systolic heart failure, PVD, sinus bradycardia, CAD, stress incontinence, ESRD on HD MWF, secondary hyperparathyroidism, ACD.  Chest Pain  Hx CAD and CABG.  Patient 10/10 chest pain and HD yesterday.  Patient had not received metoprolol yesterday for unclear reason and was back in A. fib with RVR, HR to 124.  Cardiology consulted and believe likely chest pain secondary to RVR due to missed metoprolol.  EKG performed at the time without significant changes from previous EKGs.  Troponin 0 0.25, less than previously recorded during hospitalization.  This a.m. denies chest pain. Cleared for discharge from cardiology standpoint. -Cardiology consulted, appreciate recommendations, have signed off - ensure patient receives metoprolol on time - outpatient stress after hip recovery  Post-OP Ileus: Resolved Advanced to full diet on 5/20. Patient has been tolerating this well.  1 BM yesterday.   - advance to heart healthy carb modified  - compazine prn nausea given prolonged QTc   A.Fib with RVR  Prolonged QTc on EKG: Home meds: Metoprolol XL 25mg  QD, Eliquis 5mg .  Patient back in A. fib with RVR yesterday during HD when had 10 out of 10 acute onset chest pain.  HR overnight 10 1-1 08.  HR this morning 92 during exam.  Likely secondary to not receiving metoprolol  yesterday. -Cardiology consulted, appreciate recommendations - Continue Metoprolol 25mg  QD - Cont Eliquis  - Telemetry - Avoid QT prolonging agents  Acute Hypoxic Respiratory Failure following surgery: Improving Patient has been weaned to room air, was placed back on O2 following episode of chest pain.  Patient remained on 2 L overnight.  Did have reported desaturation to 84% at 2300. - O2 prn - wean as tolerated - ambulate with pulse ox to determine DME oxygen need  Right femur fracture s/p ORIF: POD 6 s/p ORIF right femur, doing well.  Patient on scheduled Tylenol and received 4 PRN's of oxycodone in last 24 hours.  Patient is agreeable to SNF, social work has been consulted.  COVID test negative. -PT/OT: SNF, SW consulted - Ortho consulted, appreciate care and recs - f/u outpatient 2 weeks for staple removal - cont tylenol PO q6h sch - oxy IR 2.5mg  q6h prn - continue to hold home PRN tramadol - fall precautions - incentive spirometry, O2 PRN  - weight bearing as tolerated, up with assistance - reinforce dressing - recommend Endocrinology referral on D/C and DEXA scan, with consideration of starting Ca, although patient is ESRD  Concern for LUE Ischemia: Initial concern as well as cold to palpation, duplex with flow to left hand but reduced consistent with steal syndrome.  Patient seen by vascular surgery who noted that given her deconditioned state and chronicity of symptoms, will follow-up as outpatient in a few weeks. -Follow-up VS as outpatient  ESRD with HD MWF  Anemia of Chronic Disease  2` Hyperparathyroidism: HD 5/20.  GFR 11.  K4.2. - HD and electrolytes  per nephro - continue to monitor K+ - Daily RFP - Continue home calcitriol and Auryxia - Renal diet - Avoid continuous fluids  IDDM: Home meds: Lantus 12U and Humalog 5U TID with meals.  CBG 139 this AM.  Did have 277 CBG at 2021.  Novolog 10 units and Lantus 5 units received yesterday. Lantus is given at  1617. - sSSI - increase Lantus to 12u - CBG with meals  HTN:  BP 141/32.  Home meds: Hydralazine 50mg  QD and Metoprolol 25mg  QD - Continue Metoprolol  - Continue to hold home hydral given soft BP's  CHF:  Last echo 2017, EF 55-60%. Home meds: Metoprolol XL 25mg  QD.  - Continue Metoprolol  Depression: Home meds: Paxil 40mg  QD - continue home meds  HLD:  - Continue home Rosuvastatin 20mg   GERD:  - Continue home Protonix 40mg   Gout:  - Restart home Allopurinol 50mg  QD  Insomnia:  - Can restart home Melatonin 10mg  qHS PRN for sleep if requested  FEN/GI: Renal diet PPx: Lovenox  Disposition: SNF, medically cleared for d/c  Subjective:  Patient denies any chest pain this AM.  States that she is feeling well.  She is somewhat tired this morning.  Objective: Temp:  [97 F (36.1 C)-98.8 F (37.1 C)] 97.7 F (36.5 C) (05/21 0454) Pulse Rate:  [75-124] 101 (05/21 0454) Resp:  [14-28] 17 (05/21 0454) BP: (100-150)/(26-120) 141/32 (05/21 0454) SpO2:  [84 %-100 %] 99 % (05/21 0454) Weight:  [71.7 kg-72.2 kg] 71.7 kg (05/21 0500)  Physical Exam:  General: 73 y.o. female in NAD, lying in bed Cardio: Irregular Lungs: CTAB, no wheezing, no rhonchi, no crackles, no IWOB on 2L Abdomen: Soft, non-tender to palpation, non-distended, positive bowel sounds Skin: warm and dry Extremities: No edema, bandages on right lower extremity   Laboratory: Recent Labs  Lab 06/12/18 0331 06/13/18 0412 06/14/18 0257  WBC 11.0* 11.6* 13.2*  HGB 10.9* 10.5* 10.9*  HCT 33.9* 33.0* 33.8*  PLT 147* 150 149*   Recent Labs  Lab 06/12/18 0331 06/13/18 0412 06/14/18 0257  NA 137 135 139  K 4.1 4.0 4.2  CL 94* 95* 97*  CO2 26 27 32  BUN 25* 43* 20  CREATININE 4.19* 5.90* 3.78*  CALCIUM 8.5* 8.3* 8.4*  GLUCOSE 127* 153* 132*    Imaging/Diagnostic Tests: No results found.  Tabithia Stroder, Bernita Raisin, DO 06/14/2018, 7:18 AM PGY-1, [place] Intern pager:  [phone], text pages welcome

## 2018-06-14 NOTE — Progress Notes (Signed)
Report called in to [facility].

## 2018-06-14 NOTE — Progress Notes (Signed)
Physical Therapy Treatment Patient Details Name: Christine Santos MRN: [id number] DOB: [birth year] Today's Date: 06/14/2018    History of Present Illness Pt is a 73 y.o. female presenting with right hip fracture after a fall at home Now s/p IM nail RLE. PMH is significant for atrial fibrillation on Eliquis, chronic anemia, ESRD on HD, T2DM on insulin, HTN, HLD, GERD, CHF, gout.    PT Comments    Pt progressing well, she denies dizziness seated edge of bed.  BP WFL and appropriate response to activity.  SPO2 90%-94% throughout session on RA.  Pt able to perform two short bouts of gait training.  Plan next session for progression of gait training and exercises.  SNF placement remains appropriate for d/c home.    Follow Up Recommendations  SNF;Supervision for mobility/OOB;Supervision/Assistance - 24 hour     Equipment Recommendations  None recommended by PT    Recommendations for Other Services Rehab consult     Precautions / Restrictions Precautions Precautions: Fall Precaution Comments: watch 02 Restrictions Weight Bearing Restrictions: Yes RLE Weight Bearing: Weight bearing as tolerated    Mobility  Bed Mobility Overal bed mobility: Needs Assistance Bed Mobility: Supine to Sit   Sidelying to sit: Mod assist;+2 for physical assistance       General bed mobility comments: Mod +2 with assistance for LE advancement and trunk elevation to come to sitting edge of bed.  Pt denied dizziness in sitting this session BP in supine 122/67 and in sitting 138/57.    Transfers Overall transfer level: Needs assistance Equipment used: Rolling walker (2 wheeled) Transfers: Sit to/from Stand Sit to Stand: Mod assist;+2 safety/equipment         General transfer comment: Cues for hand placement to and from seated surface.  pt slow to come to standing.    Ambulation/Gait Ambulation/Gait assistance: Mod assist;+2 safety/equipment Gait Distance (Feet): 8 Feet(x2 trials.  ) Assistive  device: Rolling walker (2 wheeled) Gait Pattern/deviations: Trunk flexed;Step-to pattern;Shuffle Gait velocity: decreased   General Gait Details: Pt able to progress gt distance with close chair follow.  Pt able to accept weight in RLE.  Unable to obtain SPO2 during gait training due to poor wave form.  Pt required assistance for upper trunk control and RW maneuvering.     Stairs             Wheelchair Mobility    Modified Rankin (Stroke Patients Only)       Balance Overall balance assessment: Needs assistance Sitting-balance support: Feet supported;Bilateral upper extremity supported Sitting balance-Leahy Scale: Poor   Postural control: Posterior lean   Standing balance-Leahy Scale: Poor Standing balance comment: Requires BUE support in standing.                            Cognition Arousal/Alertness: Awake/alert Behavior During Therapy: WFL for tasks assessed/performed Overall Cognitive Status: Within Functional Limits for tasks assessed                                 General Comments: For task assessed      Exercises General Exercises - Lower Extremity Long Arc Quad: 10 reps;Seated;Right Hip Flexion/Marching: AROM;Right;10 reps;Seated    General Comments        Pertinent Vitals/Pain Pain Assessment: 0-10 Pain Score: 7  Pain Location: right hip Pain Descriptors / Indicators: Sore;Aching;Guarding Pain Intervention(s): Monitored during session;Repositioned    Home  Living                      Prior Function            PT Goals (current goals can now be found in the care plan section) Acute Rehab PT Goals Patient Stated Goal: to get better, go to rehab and then home Potential to Achieve Goals: Good Progress towards PT goals: Progressing toward goals    Frequency    Min 3X/week      PT Plan Current plan remains appropriate    Co-evaluation              AM-PAC PT "6 Clicks" Mobility   Outcome  Measure  Help needed turning from your back to your side while in a flat bed without using bedrails?: A Little Help needed moving from lying on your back to sitting on the side of a flat bed without using bedrails?: A Lot Help needed moving to and from a bed to a chair (including a wheelchair)?: A Lot Help needed standing up from a chair using your arms (e.g., wheelchair or bedside chair)?: A Lot Help needed to walk in hospital room?: A Lot Help needed climbing 3-5 steps with a railing? : Total 6 Click Score: 12    End of Session Equipment Utilized During Treatment: Gait belt Activity Tolerance: Patient tolerated treatment well Patient left: with call bell/phone within reach;in bed Nurse Communication: Mobility status PT Visit Diagnosis: Pain;Difficulty in walking, not elsewhere classified (R26.2);Unsteadiness on feet (R26.81);History of falling (Z91.81) Pain - Right/Left: Right Pain - part of body: Leg     Time: 1219-7588 PT Time Calculation (min) (ACUTE ONLY): 19 min  Charges:  $Gait Training: 8-22 mins                     Governor Rooks, PTA Acute Rehabilitation Services Pager [phone] Office [phone]     Christine Santos Eli Hose 06/14/2018, 2:33 PM

## 2018-06-15 DIAGNOSIS — D631 Anemia in chronic kidney disease: Secondary | ICD-10-CM | POA: Diagnosis not present

## 2018-06-15 DIAGNOSIS — S72141D Displaced intertrochanteric fracture of right femur, subsequent encounter for closed fracture with routine healing: Secondary | ICD-10-CM | POA: Diagnosis not present

## 2018-06-15 DIAGNOSIS — M25552 Pain in left hip: Secondary | ICD-10-CM | POA: Diagnosis not present

## 2018-06-15 DIAGNOSIS — N186 End stage renal disease: Secondary | ICD-10-CM | POA: Diagnosis not present

## 2018-06-15 DIAGNOSIS — Z992 Dependence on renal dialysis: Secondary | ICD-10-CM | POA: Diagnosis not present

## 2018-06-15 DIAGNOSIS — I482 Chronic atrial fibrillation, unspecified: Secondary | ICD-10-CM | POA: Diagnosis not present

## 2018-06-15 DIAGNOSIS — N2581 Secondary hyperparathyroidism of renal origin: Secondary | ICD-10-CM | POA: Diagnosis not present

## 2018-06-15 DIAGNOSIS — R52 Pain, unspecified: Secondary | ICD-10-CM | POA: Diagnosis not present

## 2018-06-15 NOTE — Progress Notes (Signed)
Oxycodone 2.5 mg given at 0451 am, pulled from [place] by me Tie Kennyth Lose, RN night shift. The half pill 2.5 mg was wasted on 06/14/2018 at 0730 pm in [place]. Colletta [place], RN was my wasting witness. I was unable to waste in the Pyxis machine on the next night shift due to Pt had already discharged on dayshift.   Kennyth Lose, BSN,RN,PCCN-CMC,CSC

## 2018-06-18 DIAGNOSIS — S72141D Displaced intertrochanteric fracture of right femur, subsequent encounter for closed fracture with routine healing: Secondary | ICD-10-CM | POA: Diagnosis not present

## 2018-06-18 DIAGNOSIS — N186 End stage renal disease: Secondary | ICD-10-CM | POA: Diagnosis not present

## 2018-06-18 DIAGNOSIS — D631 Anemia in chronic kidney disease: Secondary | ICD-10-CM | POA: Diagnosis not present

## 2018-06-18 DIAGNOSIS — I482 Chronic atrial fibrillation, unspecified: Secondary | ICD-10-CM | POA: Diagnosis not present

## 2018-06-18 DIAGNOSIS — M25552 Pain in left hip: Secondary | ICD-10-CM | POA: Diagnosis not present

## 2018-06-18 DIAGNOSIS — R52 Pain, unspecified: Secondary | ICD-10-CM | POA: Diagnosis not present

## 2018-06-18 DIAGNOSIS — N2581 Secondary hyperparathyroidism of renal origin: Secondary | ICD-10-CM | POA: Diagnosis not present

## 2018-06-19 DIAGNOSIS — F329 Major depressive disorder, single episode, unspecified: Secondary | ICD-10-CM | POA: Diagnosis not present

## 2018-06-19 DIAGNOSIS — F419 Anxiety disorder, unspecified: Secondary | ICD-10-CM | POA: Diagnosis not present

## 2018-06-19 DIAGNOSIS — G4700 Insomnia, unspecified: Secondary | ICD-10-CM | POA: Diagnosis not present

## 2018-06-20 DIAGNOSIS — N186 End stage renal disease: Secondary | ICD-10-CM | POA: Diagnosis not present

## 2018-06-20 DIAGNOSIS — R52 Pain, unspecified: Secondary | ICD-10-CM | POA: Diagnosis not present

## 2018-06-20 DIAGNOSIS — D631 Anemia in chronic kidney disease: Secondary | ICD-10-CM | POA: Diagnosis not present

## 2018-06-20 DIAGNOSIS — N2581 Secondary hyperparathyroidism of renal origin: Secondary | ICD-10-CM | POA: Diagnosis not present

## 2018-06-20 DIAGNOSIS — I482 Chronic atrial fibrillation, unspecified: Secondary | ICD-10-CM | POA: Diagnosis not present

## 2018-06-20 DIAGNOSIS — M25552 Pain in left hip: Secondary | ICD-10-CM | POA: Diagnosis not present

## 2018-06-22 DIAGNOSIS — N186 End stage renal disease: Secondary | ICD-10-CM | POA: Diagnosis not present

## 2018-06-22 DIAGNOSIS — R52 Pain, unspecified: Secondary | ICD-10-CM | POA: Diagnosis not present

## 2018-06-22 DIAGNOSIS — I482 Chronic atrial fibrillation, unspecified: Secondary | ICD-10-CM | POA: Diagnosis not present

## 2018-06-22 DIAGNOSIS — N2581 Secondary hyperparathyroidism of renal origin: Secondary | ICD-10-CM | POA: Diagnosis not present

## 2018-06-22 DIAGNOSIS — M79642 Pain in left hand: Secondary | ICD-10-CM | POA: Diagnosis not present

## 2018-06-22 DIAGNOSIS — M25552 Pain in left hip: Secondary | ICD-10-CM | POA: Diagnosis not present

## 2018-06-22 DIAGNOSIS — S72141D Displaced intertrochanteric fracture of right femur, subsequent encounter for closed fracture with routine healing: Secondary | ICD-10-CM | POA: Diagnosis not present

## 2018-06-22 DIAGNOSIS — D631 Anemia in chronic kidney disease: Secondary | ICD-10-CM | POA: Diagnosis not present

## 2018-06-24 DIAGNOSIS — Z992 Dependence on renal dialysis: Secondary | ICD-10-CM | POA: Diagnosis not present

## 2018-06-24 DIAGNOSIS — N186 End stage renal disease: Secondary | ICD-10-CM | POA: Diagnosis not present

## 2018-06-24 DIAGNOSIS — E1122 Type 2 diabetes mellitus with diabetic chronic kidney disease: Secondary | ICD-10-CM | POA: Diagnosis not present

## 2018-06-25 DIAGNOSIS — D631 Anemia in chronic kidney disease: Secondary | ICD-10-CM | POA: Diagnosis not present

## 2018-06-25 DIAGNOSIS — N2581 Secondary hyperparathyroidism of renal origin: Secondary | ICD-10-CM | POA: Diagnosis not present

## 2018-06-25 DIAGNOSIS — N186 End stage renal disease: Secondary | ICD-10-CM | POA: Diagnosis not present

## 2018-06-25 DIAGNOSIS — D509 Iron deficiency anemia, unspecified: Secondary | ICD-10-CM | POA: Diagnosis not present

## 2018-06-26 DIAGNOSIS — R7989 Other specified abnormal findings of blood chemistry: Secondary | ICD-10-CM | POA: Diagnosis not present

## 2018-06-26 DIAGNOSIS — S72141D Displaced intertrochanteric fracture of right femur, subsequent encounter for closed fracture with routine healing: Secondary | ICD-10-CM | POA: Diagnosis not present

## 2018-06-26 DIAGNOSIS — E876 Hypokalemia: Secondary | ICD-10-CM | POA: Diagnosis not present

## 2018-06-26 DIAGNOSIS — D631 Anemia in chronic kidney disease: Secondary | ICD-10-CM | POA: Diagnosis not present

## 2018-06-27 ENCOUNTER — Other Ambulatory Visit: Payer: Self-pay

## 2018-06-27 ENCOUNTER — Ambulatory Visit: Payer: Self-pay

## 2018-06-27 ENCOUNTER — Encounter: Payer: Self-pay | Admitting: Orthopaedic Surgery

## 2018-06-27 ENCOUNTER — Ambulatory Visit (INDEPENDENT_AMBULATORY_CARE_PROVIDER_SITE_OTHER): Payer: Medicare Other | Admitting: Orthopaedic Surgery

## 2018-06-27 ENCOUNTER — Ambulatory Visit (INDEPENDENT_AMBULATORY_CARE_PROVIDER_SITE_OTHER): Payer: Medicare Other

## 2018-06-27 ENCOUNTER — Encounter: Payer: Self-pay | Admitting: Interventional Cardiology

## 2018-06-27 DIAGNOSIS — N2581 Secondary hyperparathyroidism of renal origin: Secondary | ICD-10-CM | POA: Diagnosis not present

## 2018-06-27 DIAGNOSIS — M25551 Pain in right hip: Secondary | ICD-10-CM | POA: Diagnosis not present

## 2018-06-27 DIAGNOSIS — D631 Anemia in chronic kidney disease: Secondary | ICD-10-CM | POA: Diagnosis not present

## 2018-06-27 DIAGNOSIS — N186 End stage renal disease: Secondary | ICD-10-CM | POA: Diagnosis not present

## 2018-06-27 DIAGNOSIS — M25541 Pain in joints of right hand: Secondary | ICD-10-CM | POA: Diagnosis not present

## 2018-06-27 DIAGNOSIS — M79642 Pain in left hand: Secondary | ICD-10-CM | POA: Diagnosis not present

## 2018-06-27 DIAGNOSIS — D509 Iron deficiency anemia, unspecified: Secondary | ICD-10-CM | POA: Diagnosis not present

## 2018-06-27 NOTE — Progress Notes (Signed)
Office Visit Note   Patient: Christine Santos           Date of Birth: 1945/10/12           MRN: [id number] Visit Date: 06/27/2018              Requested by: [name], MD [address] [place], [place] [address] PCP: [name], MD   Assessment & Plan: Visit Diagnoses:  1. Pain in right hip   2. Pain in left hand     Plan: Impression is status post right hip intramedullary nail following an intertrochanteric hip fracture.  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  In regards to the hip, she will remain weightbearing as tolerated.  She will continue with physical therapy.  She will follow-up with [place] in 3 weeks time when she is 6 weeks out from surgery with repeat x-rays of the right femur.  In regards to the left long finger, she will continue using topical anti-inflammatories.  She will call with concerns or questions in the meantime.  Follow-Up Instructions: Return in about 3 weeks (around 07/18/2018).   Orders:  Orders Placed This Encounter  Procedures  . XR FEMUR, MIN 2 VIEWS RIGHT  . XR Hand Complete Left   No orders of the defined types were placed in this encounter.     Procedures: No procedures performed   Clinical Data: No additional findings.   Subjective: Chief Complaint  Patient presents with  . Right Leg - Routine Post Op  . Right Hand - Fracture    HPI patient is a pleasant 73 year old female presents our clinic today 19 days status post right hip intramedullary nail following intertrochanteric hip fracture, date of surgery 06/08/2018.  She has been doing well in regards to the right hip.  She has been residing at [facility] where she has been getting physical therapy 3 times a week, although she states this does not always happen as her dialysis occasionally conflicts with this.  She is here today in a wheelchair but notes that she has recently started ambulating with a walker.  Overall, doing well in regards to  the right hip.  Other issue she brings up today is her left long finger.  She has had pain to this finger following her most recent hospital stay 3 weeks ago.  No specific injury that she can recall.  The pain she has is primarily to the PIP joint.  Worse when she is trying to grip things.  Overall, she has noticed slight improvement.  Review of Systems as detailed in HPI.  All others reviewed and are negative.   Objective: Vital Signs: There were no vitals taken for this visit.  Physical Exam well-developed and well-nourished female in no acute distress.  Alert and oriented x3.  Ortho Exam examination of her right hip reveals well-healed surgical incisions with staples in place.  She does have slight peri-incisional erythema which looks more like irritation from the staples.  No drainage.  No signs of infection.  She is neurovascularly intact distally.  Examination of her left long finger reveals moderate tenderness palpation over the PIP joint.  She has slightly limited flexion of the PIP joint.  She is neurovascular intact distally.  Specialty Comments:  No specialty comments available.  Imaging: Xr Femur, Min 2 Views Right  Result Date: 06/27/2018 X-rays demonstrate stable fixation of the hardware without complication and stable alignment of the fracture  Xr Hand  Complete Left  Result Date: 06/27/2018 X-rays demonstrate a questionable lucency through the PIP joint diffuse degenerative changes and periarticular spurring.  Otherwise, no acute findings    PMFS History: Patient Active Problem List   Diagnosis Date Noted  . Pain in left hand 06/27/2018  . Ileus ([place])   . Closed fracture of neck of right femur ([place])   . Displaced intertrochanteric fracture of right femur, initial encounter for closed fracture ([place]) 06/07/2018  . Controlled type 2 diabetes mellitus with complication, with long-term current use of insulin ([place])   . High risk social situation 02/05/2018  . ESRD on  dialysis ([place])   . Pain in right hip 02/16/2017  . History of stroke 01/20/2017  . Physical deconditioning 12/30/2016  . Transaminitis 12/23/2016  . Paroxysmal atrial fibrillation ([place]) 10/22/2015  . Laryngopharyngeal reflux (LPR) 09/14/2015  . Diabetes mellitus type 2 with neurological manifestations ([place]) 02/26/2014  . Diabetic nephropathy ([place]) 05/23/2013  . Diabetic neuropathy ([place]) 11/30/2012  . Macular degeneration 07/04/2012  . Diabetic retinopathy ([place]) 03/20/2012  . Osteopenia 09/22/2011  . Anemia in chronic kidney disease 08/29/2011  . Gout 04/13/2011  . Anxiety 08/18/2010  . Chronic systolic heart failure ([place]) 02/18/2009  . SINUS BRADYCARDIA 10/29/2008  . Peripheral vascular disease ([place]) 06/18/2008  . ESRD (end stage renal disease) on dialysis ([place]) 07/13/2007  . Multiple complications of type II diabetes mellitus ([place]) 03/23/2006  . HYPERCHOLESTEROLEMIA 03/23/2006  . Essential hypertension 03/23/2006  . Coronary atherosclerosis 03/23/2006  . Reflux esophagitis 03/23/2006  . CYSTOCELE/RECTOCELE/PROLAPSE,UNSPEC. 03/23/2006  . Osteoarthritis, multiple sites 03/23/2006   Past Medical History:  Diagnosis Date  . Anemia 08/29/2011  . Anxiety   . Arthritis    "legs" (01/22/2013)  . CAD (coronary artery disease)    s/p CABG in 1999  . Cataract   . Chronic kidney disease    Sees Dr [name]  . Chronic lymphocytic leukemia ([place]) 10/13/2006   Qualifier: Diagnosis of  By: Andria Frames MD, Gwyndolyn Saxon    . 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 Diastolic heart failure ([place])   . Dysrhythmia    PAF  . Enteritis due to Clostridium difficile 07/02/2017  . GERD (gastroesophageal reflux disease)   . Gout   . H/O hiatal hernia   . Headache    "years ago migraines, none in along time."  . Heart murmur   . Hyperlipidemia   . Hypertension   . IDDM (insulin dependent diabetes mellitus) ([place])    Type II  . Myocardial infarction ([place])    before 1995  . PAD  (peripheral artery disease) ([place])   . Paroxysmal atrial fibrillation (HCC)    a. identified on ILR as part of StrokeAF study  . Peripheral vascular disease ([place])   . Pneumonia 11/2016  . Stress incontinence, female 11/11/2010  . Stroke (cerebrum) ([place]) 08/2015   "seeing double for 2 weeks" vision normal now. Still has balance issues 03/05/2018- vision improved, balance improved , back on walker since hospitaization,     Family History  Problem Relation Age of Onset  . Heart disease Mother   . Heart attack Mother   . Hypertension Mother   . Kidney disease Mother   . Pneumonia Father   . Heart attack Father   . Hyperlipidemia Father   . Diabetes Sister   . Breast cancer Sister   . Arthritis Brother   . Diabetes Brother   . Heart attack Sister   . Obesity Sister   .  Hypertension Sister   . Heart attack Sister   . Cancer Sister        breast  . Hyperlipidemia Sister   . Hypertension Sister   . Hyperlipidemia Sister   . Breast cancer Maternal Grandmother     Past Surgical History:  Procedure Laterality Date  . A/V FISTULAGRAM Left 12/25/2017   Procedure: A/V FISTULAGRAM;  Surgeon: Waynetta Sandy, MD;  Location: [facility];  Service: Cardiovascular;  Laterality: Left;  . A/V SHUNT INTERVENTION Left 12/25/2017   Procedure: A/V SHUNT INTERVENTION;  Surgeon: Waynetta Sandy, MD;  Location: [facility];  Service: Cardiovascular;  Laterality: Left;  . ANGIOPLASTY / STENTING FEMORAL Right 09/2010   SFA/notes 11/25/2010 (01/22/2013)  . ANGIOPLASTY / STENTING FEMORAL Left 11/2010   SFA/notes 11/25/2010 (01/22/2013)  . ANGIOPLASTY / STENTING ILIAC     [name] 11/25/2010 (01/22/2013)  . AV FISTULA PLACEMENT Left 03/07/2018   Procedure: CONVERSION TO  ARTERIOVENOUS Artegraft GRAFT ARM;  Surgeon: Waynetta Sandy, MD;  Location: [facility];  Service: Vascular;  Laterality: Left;  . BASCILIC VEIN TRANSPOSITION Left 08/23/2017   Procedure: FIRST STAGE BASILIC  VEIN TRANSPOSITION LEFT UPPER EXTREMITY;  Surgeon: [name], MD;  Location: [facility];  Service: Vascular;  Laterality: Left;  . BASCILIC VEIN TRANSPOSITION Left 10/23/2017   Procedure: SECOND STAGE BASILIC VEIN TRANSPOSITION LEFT ARM;  Surgeon: [name], MD;  Location: [facility];  Service: Vascular;  Laterality: Left;  . CATARACT EXTRACTION W/ INTRAOCULAR LENS  IMPLANT, BILATERAL Bilateral ?2011  . CHOLECYSTECTOMY  1993  . CORONARY ARTERY BYPASS GRAFT  10/1994   "CABG X3"  . EP IMPLANTABLE DEVICE N/A 09/11/2015   Procedure: Loop Recorder Insertion;  Surgeon: Thompson Grayer, MD;  Location: [facility];  Service: Cardiovascular;  Laterality: N/A;  . HEEL SPUR EXCISION Bilateral 1970's  . INTRAMEDULLARY (IM) NAIL INTERTROCHANTERIC Right 06/08/2018   Procedure: INTRAMEDULLARY (IM) NAIL INTERTROCHANTRIC;  Surgeon: Leandrew Koyanagi, MD;  Location: [facility];  Service: Orthopedics;  Laterality: Right;  . IR FLUORO GUIDE CV LINE RIGHT  11/01/2017  . IR FLUORO GUIDE CV LINE RIGHT  11/05/2017  . IR US GUIDE VASC ACCESS RIGHT  11/01/2017  . IR US GUIDE VASC ACCESS RIGHT  11/05/2017  . LOWER EXTREMITY ANGIOGRAM N/A 01/22/2013   Procedure: LOWER EXTREMITY ANGIOGRAM;  Surgeon: [name], MD;  Location: [facility];  Service: Cardiovascular;  Laterality: N/A;  . SHOULDER OPEN ROTATOR CUFF REPAIR Bilateral 1990's   "2 times on 1 side; once on the other"  . TRANSLUMINAL ATHERECTOMY FEMORAL ARTERY Right 01/22/2013   & balloon  . TUBAL LIGATION  1980's  . VIDEO ASSISTED THORACOSCOPY (VATS)/EMPYEMA Left 11/29/2016   Procedure: VIDEO ASSISTED THORACOSCOPY (VATS)/EMPYEMA;  Surgeon: [name], MD;  Location: [facility];  Service: Thoracic;  Laterality: Left;  VIDEO ASSISTED THORACOSCOPY (VATS)/EMPYEMA   Social History   Occupational History  . Occupation: DISABILITY    Employer: OTHER    Comment: Worked at [employer]  . Smoking status: Never Smoker  . Smokeless tobacco: Never  Used  Substance and Sexual Activity  . Alcohol use: No  . Drug use: No  . Sexual activity: Not Currently

## 2018-06-28 ENCOUNTER — Ambulatory Visit: Payer: Medicare Other | Admitting: Vascular Surgery

## 2018-06-29 ENCOUNTER — Ambulatory Visit (INDEPENDENT_AMBULATORY_CARE_PROVIDER_SITE_OTHER): Payer: Medicare Other | Admitting: Vascular Surgery

## 2018-06-29 ENCOUNTER — Encounter: Payer: Self-pay | Admitting: Vascular Surgery

## 2018-06-29 ENCOUNTER — Other Ambulatory Visit: Payer: Self-pay

## 2018-06-29 VITALS — BP 122/64 | HR 87 | Resp 20 | Ht 65.0 in | Wt 158.0 lb

## 2018-06-29 DIAGNOSIS — N186 End stage renal disease: Secondary | ICD-10-CM | POA: Diagnosis not present

## 2018-06-29 DIAGNOSIS — D509 Iron deficiency anemia, unspecified: Secondary | ICD-10-CM | POA: Diagnosis not present

## 2018-06-29 DIAGNOSIS — Z992 Dependence on renal dialysis: Secondary | ICD-10-CM | POA: Diagnosis not present

## 2018-06-29 DIAGNOSIS — I5022 Chronic systolic (congestive) heart failure: Secondary | ICD-10-CM | POA: Diagnosis not present

## 2018-06-29 DIAGNOSIS — N2581 Secondary hyperparathyroidism of renal origin: Secondary | ICD-10-CM | POA: Diagnosis not present

## 2018-06-29 DIAGNOSIS — E1122 Type 2 diabetes mellitus with diabetic chronic kidney disease: Secondary | ICD-10-CM | POA: Diagnosis not present

## 2018-06-29 DIAGNOSIS — S72141D Displaced intertrochanteric fracture of right femur, subsequent encounter for closed fracture with routine healing: Secondary | ICD-10-CM | POA: Diagnosis not present

## 2018-06-29 DIAGNOSIS — D631 Anemia in chronic kidney disease: Secondary | ICD-10-CM | POA: Diagnosis not present

## 2018-06-29 DIAGNOSIS — I132 Hypertensive heart and chronic kidney disease with heart failure and with stage 5 chronic kidney disease, or end stage renal disease: Secondary | ICD-10-CM | POA: Diagnosis not present

## 2018-06-29 NOTE — Progress Notes (Signed)
Patient ID: Christine Santos, female   DOB: 1945-09-26, 73 y.o.   MRN: [id number]  Reason for Consult: No chief complaint on file.   Referred by [name], MD  Subjective:     HPI:  Christine Santos is a 73 y.o. female history of end-stage renal disease currently on dialysis via left upper arm AV graft which was interposition graft placed for malfunctioning fistula.  States that she has had some bruising around the area of the graft.  Has left mostly middle finger pain at the first joint but also ring and index finger pain at the same joint.  She has numbness to her distal fingers which is been present for some time.  The most recent pain has been present since her admission to the hospital recently with femur fracture.  She is now in rehab plans to be discharged next week.  She does not have any tissue loss or ulceration.  Catheter is been removed and she is using the graft exclusively for dialysis at this time.  Past Medical History:  Diagnosis Date  . Anemia 08/29/2011  . Anxiety   . Arthritis    "legs" (01/22/2013)  . CAD (coronary artery disease)    s/p CABG in 1999  . Cataract   . Chronic kidney disease    Sees Dr [name]  . Chronic lymphocytic leukemia ([place]) 10/13/2006   Qualifier: Diagnosis of  By: Andria Frames MD, Gwyndolyn Saxon    . 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 Diastolic heart failure ([place])   . Dysrhythmia    PAF  . Enteritis due to Clostridium difficile 07/02/2017  . GERD (gastroesophageal reflux disease)   . Gout   . H/O hiatal hernia   . Headache    "years ago migraines, none in along time."  . Heart murmur   . Hyperlipidemia   . Hypertension   . IDDM (insulin dependent diabetes mellitus) ([place])    Type II  . Myocardial infarction ([place])    before 1995  . PAD (peripheral artery disease) ([place])   . Paroxysmal atrial fibrillation (H[facility])    a. identified on ILR as part of StrokeAF study  . Peripheral vascular disease ([place])   . Pneumonia  11/2016  . Stress incontinence, female 11/11/2010  . Stroke (cerebrum) ([place]) 08/2015   "seeing double for 2 weeks" vision normal now. Still has balance issues 03/05/2018- vision improved, balance improved , back on walker since hospitaization,    Family History  Problem Relation Age of Onset  . Heart disease Mother   . Heart attack Mother   . Hypertension Mother   . Kidney disease Mother   . Pneumonia Father   . Heart attack Father   . Hyperlipidemia Father   . Diabetes Sister   . Breast cancer Sister   . Arthritis Brother   . Diabetes Brother   . Heart attack Sister   . Obesity Sister   . Hypertension Sister   . Heart attack Sister   . Cancer Sister        breast  . Hyperlipidemia Sister   . Hypertension Sister   . Hyperlipidemia Sister   . Breast cancer Maternal Grandmother    Past Surgical History:  Procedure Laterality Date  . A/V FISTULAGRAM Left 12/25/2017   Procedure: A/V FISTULAGRAM;  Surgeon: Waynetta Sandy, MD;  Location: [facility];  Service: Cardiovascular;  Laterality: Left;  . A/V SHUNT INTERVENTION Left 12/25/2017   Procedure:  A/V SHUNT INTERVENTION;  Surgeon: Waynetta Sandy, MD;  Location: [facility];  Service: Cardiovascular;  Laterality: Left;  . ANGIOPLASTY / STENTING FEMORAL Right 09/2010   SFA/notes 11/25/2010 (01/22/2013)  . ANGIOPLASTY / STENTING FEMORAL Left 11/2010   SFA/notes 11/25/2010 (01/22/2013)  . ANGIOPLASTY / STENTING ILIAC     [name] 11/25/2010 (01/22/2013)  . AV FISTULA PLACEMENT Left 03/07/2018   Procedure: CONVERSION TO  ARTERIOVENOUS Artegraft GRAFT ARM;  Surgeon: Waynetta Sandy, MD;  Location: [facility];  Service: Vascular;  Laterality: Left;  . BASCILIC VEIN TRANSPOSITION Left 08/23/2017   Procedure: FIRST STAGE BASILIC VEIN TRANSPOSITION LEFT UPPER EXTREMITY;  Surgeon: [name], MD;  Location: [facility];  Service: Vascular;  Laterality: Left;  . BASCILIC VEIN TRANSPOSITION Left 10/23/2017    Procedure: SECOND STAGE BASILIC VEIN TRANSPOSITION LEFT ARM;  Surgeon: [name], MD;  Location: [facility];  Service: Vascular;  Laterality: Left;  . CATARACT EXTRACTION W/ INTRAOCULAR LENS  IMPLANT, BILATERAL Bilateral ?2011  . CHOLECYSTECTOMY  1993  . CORONARY ARTERY BYPASS GRAFT  10/1994   "CABG X3"  . EP IMPLANTABLE DEVICE N/A 09/11/2015   Procedure: Loop Recorder Insertion;  Surgeon: Thompson Grayer, MD;  Location: [facility];  Service: Cardiovascular;  Laterality: N/A;  . HEEL SPUR EXCISION Bilateral 1970's  . INTRAMEDULLARY (IM) NAIL INTERTROCHANTERIC Right 06/08/2018   Procedure: INTRAMEDULLARY (IM) NAIL INTERTROCHANTRIC;  Surgeon: Leandrew Koyanagi, MD;  Location: [facility];  Service: Orthopedics;  Laterality: Right;  . IR FLUORO GUIDE CV LINE RIGHT  11/01/2017  . IR FLUORO GUIDE CV LINE RIGHT  11/05/2017  . IR US GUIDE VASC ACCESS RIGHT  11/01/2017  . IR US GUIDE VASC ACCESS RIGHT  11/05/2017  . LOWER EXTREMITY ANGIOGRAM N/A 01/22/2013   Procedure: LOWER EXTREMITY ANGIOGRAM;  Surgeon: [name], MD;  Location: [facility] CATH LAB;  Service: Cardiovascular;  Laterality: N/A;  . SHOULDER OPEN ROTATOR CUFF REPAIR Bilateral 1990's   "2 times on 1 side; once on the other"  . TRANSLUMINAL ATHERECTOMY FEMORAL ARTERY Right 01/22/2013   & balloon  . TUBAL LIGATION  1980's  . VIDEO ASSISTED THORACOSCOPY (VATS)/EMPYEMA Left 11/29/2016   Procedure: VIDEO ASSISTED THORACOSCOPY (VATS)/EMPYEMA;  Surgeon: [name], MD;  Location: [facility];  Service: Thoracic;  Laterality: Left;  VIDEO ASSISTED THORACOSCOPY (VATS)/EMPYEMA    Short Social History:  Social History   Tobacco Use  . Smoking status: Never Smoker  . Smokeless tobacco: Never Used  Substance Use Topics  . Alcohol use: No    Allergies  Allergen Reactions  . Cefepime Anaphylaxis    Throat swelling  . Amoxicillin Rash    Did it involve swelling of the face/tongue/throat, SOB, or low BP? No Did it involve sudden or severe  rash/hives, skin peeling, or any reaction on the inside of your mouth or nose? No Did you need to seek medical attention at a hospital or doctor's office? No When did it last happen?10+ years If all above answers are "NO", may proceed with cephalosporin use.    . Codeine Phosphate Rash  . Penicillins Rash    Did it involve swelling of the face/tongue/throat, SOB, or low BP? No Did it involve sudden or severe rash/hives, skin peeling, or any reaction on the inside of your mouth or nose? No Did you need to seek medical attention at a hospital or doctor's office? No When did it last happen?10+ years If all above answers are "NO", may proceed with  cephalosporin use.     Marland Kitchen Propoxyphene Rash    Current Outpatient Medications  Medication Sig Dispense Refill  . ACCU-CHEK AVIVA PLUS test strip TEST THREE TIMES A DAY 300 each 3  . acetaminophen (TYLENOL) 500 MG tablet Take 1,000 mg by mouth every 6 (six) hours as needed for moderate pain or headache.    . allopurinol (ZYLOPRIM) 100 MG tablet Take 0.5 tablets (50 mg total) by mouth daily. 30 tablet 0  . apixaban (ELIQUIS) 5 MG TABS tablet Take 1 tablet (5 mg total) by mouth 2 (two) times daily. 60 tablet 5  . Ascorbic Acid (VITAMIN C) 1000 MG tablet Take 1,000 mg by mouth daily.    . calcitRIOL (ROCALTROL) 0.25 MCG capsule Take 1 capsule (0.25 mcg total) by mouth every Monday, Wednesday, and Friday with hemodialysis.    . carboxymethylcellulose (REFRESH PLUS) 0.5 % SOLN Place 1 drop into both eyes 3 (three) times daily.     . ferric citrate (AURYXIA) 1 GM 210 MG(Fe) tablet Take 2 tablets (420 mg total) by mouth 3 (three) times daily with meals. 270 tablet   . GNP GARLIC EXTRACT PO Take 1 tablet by mouth daily.     Marland Kitchen HUMALOG KWIKPEN 100 UNIT/ML KwikPen INJECT 5 UNITS UNDER THE SKIN THREE TIMES DAILY BEFORE MEALS (Patient taking differently: Inject 5 Units into the skin 3 (three) times daily. ) 18 mL 3  . Insulin Glargine (LANTUS  SOLOSTAR) 100 UNIT/ML Solostar Pen Inject 5-12 Units into the skin daily. (Patient taking differently: Inject 12 Units into the skin daily. )    . Melatonin 10 MG CAPS Take 10 mg by mouth at bedtime.     . metoprolol succinate (TOPROL-XL) 25 MG 24 hr tablet Take 1 tablet (25 mg total) by mouth daily. 90 tablet 3  . Multiple Vitamins-Minerals (EYE VITAMINS PO) Take 1 capsule by mouth 2 (two) times daily. Taking one daily from her opthamologist (Dr. Herbert Deaner)     . Multiple Vitamins-Minerals (MULTIVITAMINS THER. W/MINERALS) TABS Take 1 tablet by mouth daily.      . nitroGLYCERIN (NITROSTAT) 0.4 MG SL tablet Place 1 tablet (0.4 mg total) under the tongue every 5 (five) minutes as needed for chest pain.  12  . Omega-3 Fatty Acids (FISH OIL) 1000 MG CAPS Take 1,000 mg by mouth daily.    . pantoprazole (PROTONIX) 40 MG tablet TAKE 1 TABLET BY MOUTH DAILY (Patient taking differently: Take 40 mg by mouth at bedtime. ) 90 tablet 3  . PARoxetine (PAXIL) 40 MG tablet Take 1 tablet (40 mg total) by mouth every morning. (Patient taking differently: Take 40 mg by mouth daily. ) 90 tablet 3  . rosuvastatin (CRESTOR) 20 MG tablet TAKE 1 TABLET(20 MG) BY MOUTH DAILY (Patient taking differently: Take 20 mg by mouth daily. ) 90 tablet 3   No current facility-administered medications for this visit.     Review of Systems  Constitutional: Positive for fatigue.  HENT: HENT negative.  Eyes: Eyes negative.  Respiratory: Respiratory negative.  Cardiovascular: Cardiovascular negative.  Musculoskeletal:       Left hand pain Skin: Skin negative.  Neurological: Positive for numbness.  Hematologic: Positive for bruises/bleeds easily.  Psychiatric: Psychiatric negative.        Objective:   Vitals:   06/29/18 0908  BP: 122/64  Pulse: 87  Resp: 20  SpO2: 96%    Physical Exam Constitutional:      Appearance: Normal appearance.  HENT:     Head: Normocephalic.  Eyes:     Pupils: Pupils are equal, round, and  reactive to light.  Neck:     Musculoskeletal: Normal range of motion and neck supple.  Cardiovascular:     Rate and Rhythm: Normal rate.     Comments: There is no palpable radial or ulnar pulse on the left.  There is very weak signal.  With compression of the graft I get multiphasic signal and I can Doppler a palmar arch with compression as well. Pulmonary:     Effort: Pulmonary effort is normal.  Musculoskeletal:     Comments: Left upper arm bruising Strong thrill left arm  Skin:    General: Skin is warm and dry.     Capillary Refill: There is delayed left hand capillary refill which improves with compression of her arteriovenous graft Neurological:     Mental Status: She is alert.  Psychiatric:        Mood and Affect: Mood normal.        Behavior: Behavior normal.        Thought Content: Thought content normal.        Judgment: Judgment normal.     Data: No studies performed today     Assessment/Plan:     73 year old female follows up from recent hospitalization for evaluation of possible left upper extremity steal.  She had an interposition Artegraft placed after her fistula in the upper arm failed to ever function properly.  She is using the graft exclusively for dialysis and the catheter is been removed.  She has had numbness of her fingers on the left hand she says for some time this is distal fingertips only.  More recently has pain mostly in the middle finger but also affecting the ring and index finger at the first joint space.  By physical exam this certainly could be some element of steal although she has no tissue loss or ulceration and would not be a characteristic presentation of steal.  Since she is pretty deconditioned having recently been hospitalized with femur fracture and is currently in rehab she does not want to undergo any other procedures at this time which I certainly agree with.  I will have her follow-up in a few weeks with steal study we can further evaluate  her hand symptoms at that time.  If most of her symptoms appear to be secondary to steal we can proceed with left upper extremity angiogram.  If it seems to be more of a musculoskeletal issue I will refer her to hand surgery locally.  She does demonstrate good understanding.  I attempted to contact her sister but there was no answer.     Waynetta Sandy MD [facility]

## 2018-07-02 ENCOUNTER — Telehealth: Payer: Self-pay

## 2018-07-02 DIAGNOSIS — N2581 Secondary hyperparathyroidism of renal origin: Secondary | ICD-10-CM | POA: Diagnosis not present

## 2018-07-02 DIAGNOSIS — N186 End stage renal disease: Secondary | ICD-10-CM | POA: Diagnosis not present

## 2018-07-02 DIAGNOSIS — D631 Anemia in chronic kidney disease: Secondary | ICD-10-CM | POA: Diagnosis not present

## 2018-07-02 DIAGNOSIS — D509 Iron deficiency anemia, unspecified: Secondary | ICD-10-CM | POA: Diagnosis not present

## 2018-07-02 NOTE — Telephone Encounter (Signed)
Virtual Visit Pre-Appointment Phone Call  "(Name), I am calling you today to discuss your upcoming appointment. We are currently trying to limit exposure to the virus that causes COVID-19 by seeing patients at home rather than in the office."  1. "What is the BEST phone number to call the day of the visit?" - include this in appointment notes  2. "Do you have or have access to (through a family member/friend) a smartphone with video capability that we can use for your visit?" a. If yes - list this number in appt notes as "cell" (if different from BEST phone #) and list the appointment type as a VIDEO visit in appointment notes b. If no - list the appointment type as a PHONE visit in appointment notes  Confirm consent - "In the setting of the current Covid19 crisis, you are scheduled for a (phone or video) visit with your provider on (date) at (time).  Just as we do with many in-office visits, in order for you to participate in this visit, we must obtain consent.  If you'd like, I can send this to your mychart (if signed up) or email for you to review.  Otherwise, I can obtain your verbal consent now.  All virtual visits are billed to your insurance company just like a normal visit would be.  By agreeing to a virtual visit, we'd like you to understand that the technology does not allow for your provider to perform an examination, and thus may limit your provider's ability to fully assess your condition. If your provider identifies any concerns that need to be evaluated in person, we will make arrangements to do so.  Finally, though the technology is pretty good, we cannot assure that it will always work on either your or our end, and in the setting of a video visit, we may have to convert it to a phone-only visit.  In either situation, we cannot ensure that we have a secure connection.  Are you willing to proceed?" STAFF: Did the patient verbally acknowledge consent to telehealth visit? Document  YES/NO here: yes 3. Advise patient to be prepared - "Two hours prior to your appointment, go ahead and check your blood pressure, pulse, oxygen saturation, and your weight (if you have the equipment to check those) and write them all down. When your visit starts, your provider will ask you for this information. If you have an Apple Watch or Kardia device, please plan to have heart rate information ready on the day of your appointment. Please have a pen and paper handy nearby the day of the visit as well."  4. Give patient instructions for MyChart download to smartphone OR Doximity/Doxy.me as below if video visit (depending on what platform provider is using)  5. Inform patient they will receive a phone call 15 minutes prior to their appointment time (may be from unknown caller ID) so they should be prepared to answer    TELEPHONE CALL NOTE  Christine Santos has been deemed a candidate for a follow-up tele-health visit to limit community exposure during the Covid-19 pandemic. I spoke with the patient via phone to ensure availability of phone/video source, confirm preferred email & phone number, and discuss instructions and expectations.  I reminded Christine Santos to be prepared with any vital sign and/or heart rhythm information that could potentially be obtained via home monitoring, at the time of her visit. I reminded Christine Santos to expect a phone call prior to her visit.  [place], CMA 07/02/2018 4:07 PM   INSTRUCTIONS FOR DOWNLOADING THE MYCHART APP TO SMARTPHONE  - The patient must first make sure to have activated MyChart and know their login information - If Apple, go to [employer] and type in MyChart in the search bar and download the app. If Android, ask patient to go to [employer] and type in [place] in the search bar and download the app. The app is free but as with any other app downloads, their phone may require them to verify saved payment information or Apple/Android  password.  - The patient will need to then log into the app with their MyChart username and password, and select [facility] as their healthcare provider to link the account. When it is time for your visit, go to the MyChart app, find appointments, and click Begin Video Visit. Be sure to Select Allow for your device to access the Microphone and Camera for your visit. You will then be connected, and your provider will be with you shortly.  **If they have any issues connecting, or need assistance please contact MyChart service desk [phone])**  **If using a computer, in order to ensure the best quality for their visit they will need to use either of the following Internet Browsers: [employer], or Google Chrome**  IF USING DOXIMITY or DOXY.ME - The patient will receive a link just prior to their visit by text.     FULL LENGTH CONSENT FOR TELE-HEALTH VISIT   I hereby voluntarily request, consent and authorize [facility] and its employed or contracted physicians, physician assistants, nurse practitioners or other licensed health care professionals (the Practitioner), to provide me with telemedicine health care services (the "Services") as deemed necessary by the treating Practitioner. I acknowledge and consent to receive the Services by the Practitioner via telemedicine. I understand that the telemedicine visit will involve communicating with the Practitioner through live audiovisual communication technology and the disclosure of certain medical information by electronic transmission. I acknowledge that I have been given the opportunity to request an in-person assessment or other available alternative prior to the telemedicine visit and am voluntarily participating in the telemedicine visit.  I understand that I have the right to withhold or withdraw my consent to the use of telemedicine in the course of my care at any time, without affecting my right to future care or treatment,  and that the Practitioner or I may terminate the telemedicine visit at any time. I understand that I have the right to inspect all information obtained and/or recorded in the course of the telemedicine visit and may receive copies of available information for a reasonable fee.  I understand that some of the potential risks of receiving the Services via telemedicine include:  Marland Kitchen Delay or interruption in medical evaluation due to technological equipment failure or disruption; . Information transmitted may not be sufficient (e.g. poor resolution of images) to allow for appropriate medical decision making by the Practitioner; and/or  . In rare instances, security protocols could fail, causing a breach of personal health information.  Furthermore, I acknowledge that it is my responsibility to provide information about my medical history, conditions and care that is complete and accurate to the best of my ability. I acknowledge that Practitioner's advice, recommendations, and/or decision may be based on factors not within their control, such as incomplete or inaccurate data provided by me or distortions of diagnostic images or specimens that may result from electronic transmissions. I understand that  the practice of medicine is not an exact science and that Practitioner makes no warranties or guarantees regarding treatment outcomes. I acknowledge that I will receive a copy of this consent concurrently upon execution via email to the email address I last provided but may also request a printed copy by calling the office of [facility].    I understand that my insurance will be billed for this visit.   I have read or had this consent read to me. . I understand the contents of this consent, which adequately explains the benefits and risks of the Services being provided via telemedicine.  . I have been provided ample opportunity to ask questions regarding this consent and the Services and have had my questions  answered to my satisfaction. . I give my informed consent for the services to be provided through the use of telemedicine in my medical care  By participating in this telemedicine visit I agree to the above.

## 2018-07-04 ENCOUNTER — Ambulatory Visit: Payer: Medicare Other | Admitting: Family Medicine

## 2018-07-04 DIAGNOSIS — I2510 Atherosclerotic heart disease of native coronary artery without angina pectoris: Secondary | ICD-10-CM | POA: Diagnosis not present

## 2018-07-04 DIAGNOSIS — D509 Iron deficiency anemia, unspecified: Secondary | ICD-10-CM | POA: Diagnosis not present

## 2018-07-04 DIAGNOSIS — L89620 Pressure ulcer of left heel, unstageable: Secondary | ICD-10-CM | POA: Diagnosis not present

## 2018-07-04 DIAGNOSIS — N2581 Secondary hyperparathyroidism of renal origin: Secondary | ICD-10-CM | POA: Diagnosis not present

## 2018-07-04 DIAGNOSIS — L89152 Pressure ulcer of sacral region, stage 2: Secondary | ICD-10-CM | POA: Diagnosis not present

## 2018-07-04 DIAGNOSIS — I509 Heart failure, unspecified: Secondary | ICD-10-CM | POA: Diagnosis not present

## 2018-07-04 DIAGNOSIS — N186 End stage renal disease: Secondary | ICD-10-CM | POA: Diagnosis not present

## 2018-07-04 DIAGNOSIS — E1122 Type 2 diabetes mellitus with diabetic chronic kidney disease: Secondary | ICD-10-CM | POA: Diagnosis not present

## 2018-07-04 DIAGNOSIS — E1151 Type 2 diabetes mellitus with diabetic peripheral angiopathy without gangrene: Secondary | ICD-10-CM | POA: Diagnosis not present

## 2018-07-04 DIAGNOSIS — S72141D Displaced intertrochanteric fracture of right femur, subsequent encounter for closed fracture with routine healing: Secondary | ICD-10-CM | POA: Diagnosis not present

## 2018-07-04 DIAGNOSIS — D631 Anemia in chronic kidney disease: Secondary | ICD-10-CM | POA: Diagnosis not present

## 2018-07-04 DIAGNOSIS — I132 Hypertensive heart and chronic kidney disease with heart failure and with stage 5 chronic kidney disease, or end stage renal disease: Secondary | ICD-10-CM | POA: Diagnosis not present

## 2018-07-05 ENCOUNTER — Telehealth: Payer: Self-pay

## 2018-07-05 NOTE — Telephone Encounter (Signed)
Erlene Quan, OT, calls nurse line requesting verbal orders for home health OT for 1x a week for 4 weeks.   Erlene Quan [phone], you may leave a VM.

## 2018-07-05 NOTE — Telephone Encounter (Signed)
Glenard Haring, home health nurse, called nurse line requesting VO for diabetes management and wound care for a few pressure ulcers.  1x week for 4 weeks 2 PRN   Glenard Haring [phone] ok to LVM

## 2018-07-05 NOTE — Telephone Encounter (Signed)
Verbal order given as requested. 

## 2018-07-05 NOTE — Telephone Encounter (Signed)
Verbal orders given as requested. 

## 2018-07-06 ENCOUNTER — Telehealth: Payer: Self-pay

## 2018-07-06 DIAGNOSIS — D509 Iron deficiency anemia, unspecified: Secondary | ICD-10-CM | POA: Diagnosis not present

## 2018-07-06 DIAGNOSIS — D631 Anemia in chronic kidney disease: Secondary | ICD-10-CM | POA: Diagnosis not present

## 2018-07-06 DIAGNOSIS — N186 End stage renal disease: Secondary | ICD-10-CM | POA: Diagnosis not present

## 2018-07-06 DIAGNOSIS — N2581 Secondary hyperparathyroidism of renal origin: Secondary | ICD-10-CM | POA: Diagnosis not present

## 2018-07-06 NOTE — Telephone Encounter (Signed)
Christine Santos, home health PT, called nurse line requesting VO for continuing of home PT for patient.   1x week for 1 week 2x week for 1 week  1x week for 6 week  Working on ambulation, stair climbing and equipment use.   Christine Santos [phone]

## 2018-07-09 DIAGNOSIS — D631 Anemia in chronic kidney disease: Secondary | ICD-10-CM | POA: Diagnosis not present

## 2018-07-09 DIAGNOSIS — N186 End stage renal disease: Secondary | ICD-10-CM | POA: Diagnosis not present

## 2018-07-09 DIAGNOSIS — D509 Iron deficiency anemia, unspecified: Secondary | ICD-10-CM | POA: Diagnosis not present

## 2018-07-09 DIAGNOSIS — N2581 Secondary hyperparathyroidism of renal origin: Secondary | ICD-10-CM | POA: Diagnosis not present

## 2018-07-09 NOTE — Progress Notes (Signed)
Virtual Visit via Video Note   This visit type was conducted due to national recommendations for restrictions regarding the COVID-19 Pandemic (e.g. social distancing) in an effort to limit this patient's exposure and mitigate transmission in our community.  Due to her co-morbid illnesses, this patient is at least at moderate risk for complications without adequate follow up.  This format is felt to be most appropriate for this patient at this time.  All issues noted in this document were discussed and addressed.  A limited physical exam was performed with this format.  Please refer to the patient's chart for her consent to telehealth for [facility].   Patient unable to access her camera Date:  07/10/2018   ID:  Christine Santos, DOB March 05, 1945, MRN [id number]  Patient Location: Home Provider Location: Home  PCP:  [name], MD  Cardiologist:  Larae Grooms, MD  Electrophysiologist:  None   Evaluation Performed:  Follow-Up Visit  Chief Complaint:  CAD  History of Present Illness:    Christine Santos is a 73 y.o. female with bilateral LE revascularization. The right leg was treated with PTA and the left SFA was treated with rotational atherectomyseverealyears ago. She ha s a h/o CAD, CABG in 1999, CKD. She has had restenosisin her legsbut managed medically due to no claudication.   Was treated for CLL with chemo. Now in remission. Feels well. Tolerated Chemo well.  She had a stroke in 8/17 with visual changes. Visual changes improved. She had acute on chronic renal failure at that time. Cr up to 5.5.   She has had a fall with a broken arm in 4/18.   She had low iron and requires an infusion.    She has had recurrent C.Diff.     She fell and broke a hip, requiring surgery in 5/20. Now on dialysis M, W, Fr  She has had some right leg edema after stopping diuretics.  SHe has been restarted on this by the nephrologist.  She had a sore on the back of  her.  Denies : Chest pain. Dizziness.  Nitroglycerin use. Orthopnea. Palpitations. Paroxysmal nocturnal dyspnea. Shortness of breath. Syncope.   The patient does not have symptoms concerning for COVID-19 infection (fever, chills, cough, or new shortness of breath).    Past Medical History:  Diagnosis Date  . Anemia 08/29/2011  . Anxiety   . Arthritis    "legs" (01/22/2013)  . CAD (coronary artery disease)    s/p CABG in 1999  . Cataract   . Chronic kidney disease    Sees Dr [name]  . Chronic lymphocytic leukemia ([place]) 10/13/2006   Qualifier: Diagnosis of  By: Andria Frames MD, Gwyndolyn Saxon    . 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 Diastolic heart failure ([place])   . Dysrhythmia    PAF  . Enteritis due to Clostridium difficile 07/02/2017  . GERD (gastroesophageal reflux disease)   . Gout   . H/O hiatal hernia   . Headache    "years ago migraines, none in along time."  . Heart murmur   . Hyperlipidemia   . Hypertension   . 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 Myocardial infarction ([place])    before 1995  . PAD (peripheral artery disease) ([place])   . Paroxysmal atrial fibrillation ([facility])    a. identified on ILR as part of StrokeAF study  . Peripheral vascular disease ([place])   . Pneumonia  11/2016  . Stress incontinence, female 11/11/2010  . 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 Still has balance issues 03/05/2018- vision improved, balance improved , back on walker since hospitaization,    Past Surgical History:  Procedure Laterality Date  . A/V FISTULAGRAM Left 12/25/2017   Procedure: A/V FISTULAGRAM;  Surgeon: Waynetta Sandy, MD;  Location: [facility];  Service: Cardiovascular;  Laterality: Left;  . A/V SHUNT INTERVENTION Left 12/25/2017   Procedure: A/V SHUNT INTERVENTION;  Surgeon: Waynetta Sandy, MD;  Location: [facility];  Service: Cardiovascular;   Laterality: Left;  . ANGIOPLASTY / STENTING FEMORAL Right 09/2010   SFA/notes 11/25/2010 (01/22/2013)  . ANGIOPLASTY / STENTING FEMORAL Left 11/2010   SFA/notes 11/25/2010 (01/22/2013)  . ANGIOPLASTY / STENTING ILIAC     [name] 11/25/2010 (01/22/2013)  . AV FISTULA PLACEMENT Left 03/07/2018   Procedure: CONVERSION TO  ARTERIOVENOUS Artegraft GRAFT ARM;  Surgeon: Waynetta Sandy, MD;  Location: [facility];  Service: Vascular;  Laterality: Left;  . BASCILIC VEIN TRANSPOSITION Left 08/23/2017   Procedure: FIRST STAGE BASILIC VEIN TRANSPOSITION LEFT UPPER EXTREMITY;  Surgeon: [name], MD;  Location: [facility];  Service: Vascular;  Laterality: Left;  . BASCILIC VEIN TRANSPOSITION Left 10/23/2017   Procedure: SECOND STAGE BASILIC VEIN TRANSPOSITION LEFT ARM;  Surgeon: [name], MD;  Location: [facility];  Service: Vascular;  Laterality: Left;  . CATARACT EXTRACTION W/ INTRAOCULAR LENS  IMPLANT, BILATERAL Bilateral ?2011  . CHOLECYSTECTOMY  1993  . CORONARY ARTERY BYPASS GRAFT  10/1994   "CABG X3"  . EP IMPLANTABLE DEVICE N/A 09/11/2015   Procedure: Loop Recorder Insertion;  Surgeon: Thompson Grayer, MD;  Location: [facility];  Service: Cardiovascular;  Laterality: N/A;  . HEEL SPUR EXCISION Bilateral 1970's  . INTRAMEDULLARY (IM) NAIL INTERTROCHANTERIC Right 06/08/2018   Procedure: INTRAMEDULLARY (IM) NAIL INTERTROCHANTRIC;  Surgeon: Leandrew Koyanagi, MD;  Location: [facility];  Service: Orthopedics;  Laterality: Right;  . IR FLUORO GUIDE CV LINE RIGHT  11/01/2017  . IR FLUORO GUIDE CV LINE RIGHT  11/05/2017  . IR US GUIDE VASC ACCESS RIGHT  11/01/2017  . IR US GUIDE VASC ACCESS RIGHT  11/05/2017  . LOWER EXTREMITY ANGIOGRAM N/A 01/22/2013   Procedure: LOWER EXTREMITY ANGIOGRAM;  Surgeon: [name], MD;  Location: [facility] CATH LAB;  Service: Cardiovascular;  Laterality: N/A;  . SHOULDER OPEN ROTATOR CUFF REPAIR Bilateral 1990's   "2 times on 1 side; once on the other"  . TRANSLUMINAL  ATHERECTOMY FEMORAL ARTERY Right 01/22/2013   & balloon  . TUBAL LIGATION  1980's  . VIDEO ASSISTED THORACOSCOPY (VATS)/EMPYEMA Left 11/29/2016   Procedure: VIDEO ASSISTED THORACOSCOPY (VATS)/EMPYEMA;  Surgeon: [name], MD;  Location: [facility];  Service: Thoracic;  Laterality: Left;  VIDEO ASSISTED THORACOSCOPY (VATS)/EMPYEMA     Current Meds  Medication Sig  . ACCU-CHEK AVIVA PLUS test strip TEST THREE TIMES A DAY  . acetaminophen (TYLENOL) 500 MG tablet Take 1,000 mg by mouth every 6 (six) hours as needed for moderate pain or headache.  . allopurinol (ZYLOPRIM) 100 MG tablet Take 0.5 tablets (50 mg total) by mouth daily.  Marland Kitchen apixaban (ELIQUIS) 5 MG TABS tablet Take 1 tablet (5 mg total) by mouth 2 (two) times daily.  . Ascorbic Acid (VITAMIN C) 1000 MG tablet Take 1,000 mg by mouth daily.  . calcitRIOL (ROCALTROL) 0.25 MCG capsule Take 1 capsule (0.25 mcg total) by mouth  every Monday, Wednesday, and Friday with hemodialysis.  . carboxymethylcellulose (REFRESH PLUS) 0.5 % SOLN Place 1 drop into both eyes 3 (three) times daily.   . ferric citrate (AURYXIA) 1 GM 210 MG(Fe) tablet Take 2 tablets (420 mg total) by mouth 3 (three) times daily with meals.  Marland Kitchen GNP GARLIC EXTRACT PO Take 1 tablet by mouth daily.   Marland Kitchen HUMALOG KWIKPEN 100 UNIT/ML KwikPen INJECT 5 UNITS UNDER THE SKIN THREE TIMES DAILY BEFORE MEALS (Patient taking differently: Inject 5 Units into the skin 3 (three) times daily. )  . Insulin Glargine (LANTUS SOLOSTAR) 100 UNIT/ML Solostar Pen Inject 5-12 Units into the skin daily. (Patient taking differently: Inject 12 Units into the skin daily. )  . lidocaine-prilocaine (EMLA) cream APPLY SMALL AMOUNT TO ACCESS SITE (AVF) 1 TO 2 HOURS BEFORE DIALYSIS. COVER WITH OCCLUSIVE DRESSING (SARAN WRAP)  . Melatonin 10 MG CAPS Take 10 mg by mouth at bedtime.   . metoprolol succinate (TOPROL-XL) 25 MG 24 hr tablet Take 1 tablet (25 mg total) by mouth daily.  . Multiple Vitamins-Minerals (EYE  VITAMINS PO) Take 1 capsule by mouth 2 (two) times daily. Taking one daily from her opthamologist (Dr. Herbert Deaner)   . Multiple Vitamins-Minerals (MULTIVITAMINS THER. W/MINERALS) TABS Take 1 tablet by mouth daily.    . nitroGLYCERIN (NITROSTAT) 0.4 MG SL tablet Place 1 tablet (0.4 mg total) under the tongue every 5 (five) minutes as needed for chest pain.  . Omega-3 Fatty Acids (FISH OIL) 1000 MG CAPS Take 1,000 mg by mouth daily.  . pantoprazole (PROTONIX) 40 MG tablet TAKE 1 TABLET BY MOUTH DAILY (Patient taking differently: Take 40 mg by mouth at bedtime. )  . PARoxetine (PAXIL) 40 MG tablet Take 1 tablet (40 mg total) by mouth every morning. (Patient taking differently: Take 40 mg by mouth daily. )  . rosuvastatin (CRESTOR) 20 MG tablet TAKE 1 TABLET(20 MG) BY MOUTH DAILY (Patient taking differently: Take 20 mg by mouth daily. )     Allergies:   Cefepime, Amoxicillin, Codeine phosphate, Penicillins, and Propoxyphene   Social History   Tobacco Use  . Smoking status: Never Smoker  . Smokeless tobacco: Never Used  Substance Use Topics  . Alcohol use: No  . Drug use: No     Family Hx: The patient's family history includes Arthritis in her brother; Breast cancer in her maternal grandmother and sister; Cancer in her sister; Diabetes in her brother and sister; Heart attack in her father, mother, sister, and sister; Heart disease in her mother; Hyperlipidemia in her father, sister, and sister; Hypertension in her mother, sister, and sister; Kidney disease in her mother; Obesity in her sister; Pneumonia in her father.  ROS:   Please see the history of present illness.    Hip pain improved All other systems reviewed and are negative.   Prior CV studies:   The following studies were reviewed today: 2017 EF 55-60%   Labs/Other Tests and Data Reviewed:    EKG:  An ECG dated 05/2018 was personally reviewed today and demonstrated:  AFib with RVR  Recent Labs: 10/27/2017: B Natriuretic Peptide  1,450.3 06/07/2018: ALT 64 06/14/2018: BUN 20; Creatinine, Ser 3.78; Hemoglobin 10.9; Platelets 149; Potassium 4.2; Sodium 139   Recent Lipid Panel Lab Results  Component Value Date/Time   CHOL 137 06/07/2018 03:53 AM   TRIG 52 06/07/2018 03:53 AM   HDL 58 06/07/2018 03:53 AM   CHOLHDL 2.4 06/07/2018 03:53 AM   LDLCALC 69 06/07/2018 03:53 AM  Wt Readings from Last 3 Encounters:  07/10/18 158 lb (71.7 kg)  06/29/18 158 lb (71.7 kg)  06/14/18 158 lb 1.6 oz (71.7 kg)     Objective:    Vital Signs:  Ht 5\' 5"  (1.651 m)   Wt 158 lb (71.7 kg)   BMI 26.29 kg/m    VITAL SIGNS:  reviewed GEN:  no acute distress RESPIRATORY:  no shortness of breath PSYCH:  normal affect exam limited  ASSESSMENT & PLAN:    1. CAD: No angina.  COntinue aggressive secondary prevention.  2. AFib: Rate controlled.  Anticoagulated with Eliquis. Was fast post surgery in the hospital. Hbg checked in 5/20.   3. PAD: Has a persistent wound on her hip.  She wil check with [facility] PT about seeing if there is a [place] wound care.  4. CRI: On dialysis 3 days/week.  BP well controlled at dialysis. 5. LDL 69 in 05/2018.  COntinue statin.   COVID-19 Education: The signs and symptoms of COVID-19 were discussed with the patient and how to seek care for testing (follow up with PCP or arrange E-visit).  The importance of social distancing was discussed today.  Time:   Today, I have spent 15 minutes with the patient with telehealth technology discussing the above problems.     Medication Adjustments/Labs and Tests Ordered: Current medicines are reviewed at length with the patient today.  Concerns regarding medicines are outlined above.   Tests Ordered: No orders of the defined types were placed in this encounter.   Medication Changes: No orders of the defined types were placed in this encounter.   Follow Up:  Virtual Visit or In Person in 6 month(s)  Signed, Larae Grooms, MD  07/10/2018 2:06 PM    [place]

## 2018-07-09 NOTE — Telephone Encounter (Signed)
Verbal orders given as requested. 

## 2018-07-10 ENCOUNTER — Encounter: Payer: Self-pay | Admitting: Interventional Cardiology

## 2018-07-10 ENCOUNTER — Other Ambulatory Visit: Payer: Self-pay

## 2018-07-10 ENCOUNTER — Telehealth (INDEPENDENT_AMBULATORY_CARE_PROVIDER_SITE_OTHER): Payer: Medicare Other | Admitting: Interventional Cardiology

## 2018-07-10 VITALS — Ht 65.0 in | Wt 158.0 lb

## 2018-07-10 DIAGNOSIS — Z992 Dependence on renal dialysis: Secondary | ICD-10-CM

## 2018-07-10 DIAGNOSIS — N186 End stage renal disease: Secondary | ICD-10-CM | POA: Diagnosis not present

## 2018-07-10 DIAGNOSIS — I739 Peripheral vascular disease, unspecified: Secondary | ICD-10-CM | POA: Diagnosis not present

## 2018-07-10 DIAGNOSIS — I25119 Atherosclerotic heart disease of native coronary artery with unspecified angina pectoris: Secondary | ICD-10-CM

## 2018-07-10 DIAGNOSIS — I4821 Permanent atrial fibrillation: Secondary | ICD-10-CM

## 2018-07-10 MED ORDER — NITROGLYCERIN 0.4 MG SL SUBL: 0.4000 mg | SUBLINGUAL_TABLET | SUBLINGUAL | 3 refills | Status: DC | PRN

## 2018-07-10 NOTE — Patient Instructions (Signed)

## 2018-07-11 ENCOUNTER — Other Ambulatory Visit: Payer: Self-pay

## 2018-07-11 ENCOUNTER — Ambulatory Visit: Payer: Medicare Other | Admitting: Podiatry

## 2018-07-11 ENCOUNTER — Telehealth (INDEPENDENT_AMBULATORY_CARE_PROVIDER_SITE_OTHER): Payer: Medicare Other | Admitting: Family Medicine

## 2018-07-11 ENCOUNTER — Other Ambulatory Visit: Payer: Self-pay | Admitting: *Deleted

## 2018-07-11 DIAGNOSIS — R413 Other amnesia: Secondary | ICD-10-CM

## 2018-07-11 DIAGNOSIS — Z79899 Other long term (current) drug therapy: Secondary | ICD-10-CM

## 2018-07-11 DIAGNOSIS — N186 End stage renal disease: Secondary | ICD-10-CM | POA: Diagnosis not present

## 2018-07-11 DIAGNOSIS — R5381 Other malaise: Secondary | ICD-10-CM

## 2018-07-11 DIAGNOSIS — I480 Paroxysmal atrial fibrillation: Secondary | ICD-10-CM

## 2018-07-11 DIAGNOSIS — D631 Anemia in chronic kidney disease: Secondary | ICD-10-CM | POA: Diagnosis not present

## 2018-07-11 DIAGNOSIS — K2100 Gastro-esophageal reflux disease with esophagitis, without bleeding: Secondary | ICD-10-CM

## 2018-07-11 DIAGNOSIS — E7800 Pure hypercholesterolemia, unspecified: Secondary | ICD-10-CM | POA: Diagnosis not present

## 2018-07-11 DIAGNOSIS — I2510 Atherosclerotic heart disease of native coronary artery without angina pectoris: Secondary | ICD-10-CM

## 2018-07-11 DIAGNOSIS — F419 Anxiety disorder, unspecified: Secondary | ICD-10-CM

## 2018-07-11 DIAGNOSIS — D509 Iron deficiency anemia, unspecified: Secondary | ICD-10-CM | POA: Diagnosis not present

## 2018-07-11 DIAGNOSIS — N2581 Secondary hyperparathyroidism of renal origin: Secondary | ICD-10-CM | POA: Diagnosis not present

## 2018-07-11 MED ORDER — CALCITRIOL 0.25 MCG PO CAPS: 0.2500 ug | ORAL_CAPSULE | ORAL | 3 refills | Status: DC

## 2018-07-11 MED ORDER — INSULIN LISPRO (1 UNIT DIAL) 100 UNIT/ML (KWIKPEN): PEN_INJECTOR | SUBCUTANEOUS | 3 refills | Status: DC

## 2018-07-11 MED ORDER — ALLOPURINOL 100 MG PO TABS: 50.0000 mg | ORAL_TABLET | Freq: Every day | ORAL | 3 refills | Status: DC

## 2018-07-11 MED ORDER — PAROXETINE HCL 40 MG PO TABS: 40.0000 mg | ORAL_TABLET | ORAL | 3 refills | Status: DC

## 2018-07-11 MED ORDER — LANTUS SOLOSTAR 100 UNIT/ML ~~LOC~~ SOPN: 12.0000 [IU] | PEN_INJECTOR | Freq: Every day | SUBCUTANEOUS | 3 refills | Status: AC

## 2018-07-11 MED ORDER — LANTUS SOLOSTAR 100 UNIT/ML ~~LOC~~ SOPN: 12.0000 [IU] | PEN_INJECTOR | Freq: Every day | SUBCUTANEOUS | 3 refills | Status: DC

## 2018-07-11 MED ORDER — PANTOPRAZOLE SODIUM 40 MG PO TBEC: 40.0000 mg | DELAYED_RELEASE_TABLET | Freq: Every day | ORAL | 3 refills | Status: DC

## 2018-07-11 MED ORDER — METOPROLOL SUCCINATE ER 25 MG PO TB24: 25.0000 mg | ORAL_TABLET | Freq: Every day | ORAL | 3 refills | Status: DC

## 2018-07-11 MED ORDER — ROSUVASTATIN CALCIUM 20 MG PO TABS: ORAL_TABLET | ORAL | 3 refills | Status: DC

## 2018-07-11 MED ORDER — APIXABAN 5 MG PO TABS: 5.0000 mg | ORAL_TABLET | Freq: Two times a day (BID) | ORAL | 3 refills | Status: DC

## 2018-07-11 NOTE — Assessment & Plan Note (Signed)
Prescriptions sent to new pharmacy.

## 2018-07-11 NOTE — Assessment & Plan Note (Signed)
Concern for dementia, delerium and depression.  Assess at visit in one week.

## 2018-07-11 NOTE — Progress Notes (Signed)
Sister wanted to talk to me about switching meds to another pharmacy and making sure we have 3 month prescriptions. Called patient.  Got her permission to speak with sister and for phone visit.  No smart phone, cannot receive texts.  Spoke with sister, [name].  [name] wants meds switched and 3 months supply.  Went over all meds and sent in new prescriptions.  Two additional problems.  [name] feels exhausted.  She is doing much of the care.  She arranged an in home caregiver, then Dalene Seltzer and Takeyah fired the caregiver.  She has tried saying no and they still call her.  [name] believes Khristi's cognition and memory has slipped terribly over the past one year.  Mikala has an appointment to see me in one week.  Will make it a face to face visit.  Likely needs a mini mental status exam.  Also I need to do family counseling around the need for caregiver.  Duration of phone call 15 minutes.

## 2018-07-11 NOTE — Assessment & Plan Note (Signed)
Discuss issue of caregiver next visit.

## 2018-07-12 ENCOUNTER — Telehealth: Payer: Self-pay | Admitting: *Deleted

## 2018-07-12 NOTE — Telephone Encounter (Signed)
Received fax from pharmacy, PA needed on Calcitriol.  Clinical questions placed in MDs box for review.  Cover My Meds info:   Key: [id number]  Christen Bame, CMA

## 2018-07-12 NOTE — Telephone Encounter (Signed)
Done

## 2018-07-12 NOTE — Telephone Encounter (Signed)
Received fax from pharmacy, PA needed on calcitriol.  Clinical questions submitted via Cover My Meds.  Waiting on response, could take up to 72 hours.  Cover My Meds info: Key: [id number]  Christen Bame, CMA

## 2018-07-12 NOTE — Telephone Encounter (Signed)
Noted  

## 2018-07-13 ENCOUNTER — Telehealth: Payer: Self-pay | Admitting: Family Medicine

## 2018-07-13 DIAGNOSIS — D631 Anemia in chronic kidney disease: Secondary | ICD-10-CM | POA: Diagnosis not present

## 2018-07-13 DIAGNOSIS — D509 Iron deficiency anemia, unspecified: Secondary | ICD-10-CM | POA: Diagnosis not present

## 2018-07-13 DIAGNOSIS — N2581 Secondary hyperparathyroidism of renal origin: Secondary | ICD-10-CM | POA: Diagnosis not present

## 2018-07-13 DIAGNOSIS — N186 End stage renal disease: Secondary | ICD-10-CM | POA: Diagnosis not present

## 2018-07-13 NOTE — Telephone Encounter (Signed)
Medication denied.  See below:

## 2018-07-13 NOTE — Telephone Encounter (Signed)
**  After Hours/ Emergency Line Call**  Received a call to report that Christine Santos. When speaking to [name] she reports she "feel real bad after dialysis". Does report new cough. No fevers. Her friend at home then took to phone and explained patient is not SOB, she was just calling because they saw a missed phone call from the [facility] from Dr. Andria Frames and was returning the call. I again asked to clarify, is the patient SOB. She now denies any SOB or difficulty breathing. Per chart review patient's daughter seems to think patient has some cognition issues and memory has slipped over past year. Patient expressed frustration as she was hoping to speak to PCP. I apologized and informed patient clinic was closed at noon today but I would forward to PCP. Patient states she would call on Monday to discuss missed call with patient. During encounter patient was speaking full sentences and did not appear to have increased WOB. Red flags discussed.  Will forward to PCP.  Caroline More, DO PGY-2, [facility] 07/13/2018 3:44 PM

## 2018-07-14 ENCOUNTER — Telehealth: Payer: Self-pay | Admitting: Family Medicine

## 2018-07-14 NOTE — Telephone Encounter (Signed)
**  After Hours/ Emergency Line Call**  Received a call to report that Christine Santos is having profuse diarrhea.  Endorsing tolerating a little bit of p.o. but not much, abdominal cramps, this is happened before in the past.  Denying fevers/chills, nausea/vomiting, any sick contacts, anyone with similar symptoms, hematochezia, melena.  Patient is an ESRD patient and cannot tolerate gross fluids to prevent against losses.  Recommended that patient come to the ED for evaluation of hydration status, electrolyte abnormalities, potential antidiarrheals.  Red flags discussed.  Will forward to PCP.      Bonnita Hollow, MD PGY-2, [facility] 07/14/2018 11:32 AM

## 2018-07-16 ENCOUNTER — Telehealth: Payer: Self-pay

## 2018-07-16 NOTE — Telephone Encounter (Signed)
Patient calls nurse line stating she has been having diarrhea since Saturday. Patient has an apt scheduled for Wednesday with PCP. I advised the apt be switched to a virtual given sxs. Patient denies fever, cough or SOB. Patient would like something called in before hand if at all possible. Please advise.

## 2018-07-16 NOTE — Telephone Encounter (Signed)
Pt informed. Offered a virtual phone visit for tomorrow. Pt declined. Pt would rather see Dr. Andria Frames on Wed. Ottis Stain, CMA

## 2018-07-16 NOTE — Telephone Encounter (Signed)
Since she is on anticoagulation she should not take anything OTC until she talks to a doctor. If she would like a visit before the PCPs she can.  Thanks  Truman Hayward

## 2018-07-17 ENCOUNTER — Telehealth: Payer: Self-pay | Admitting: *Deleted

## 2018-07-17 ENCOUNTER — Other Ambulatory Visit: Payer: Self-pay

## 2018-07-17 ENCOUNTER — Emergency Department (HOSPITAL_COMMUNITY): Payer: Medicare Other

## 2018-07-17 ENCOUNTER — Encounter (HOSPITAL_COMMUNITY): Payer: Self-pay | Admitting: Emergency Medicine

## 2018-07-17 ENCOUNTER — Inpatient Hospital Stay (HOSPITAL_COMMUNITY)
Admission: EM | Admit: 2018-07-17 | Discharge: 2018-07-24 | DRG: 371 | Disposition: A | Discharge status: Hospice | Payer: Medicare Other | Attending: Family Medicine | Admitting: Family Medicine

## 2018-07-17 DIAGNOSIS — R9431 Abnormal electrocardiogram [ECG] [EKG]: Secondary | ICD-10-CM

## 2018-07-17 DIAGNOSIS — I2510 Atherosclerotic heart disease of native coronary artery without angina pectoris: Secondary | ICD-10-CM | POA: Diagnosis not present

## 2018-07-17 DIAGNOSIS — D696 Thrombocytopenia, unspecified: Secondary | ICD-10-CM | POA: Diagnosis present

## 2018-07-17 DIAGNOSIS — B370 Candidal stomatitis: Secondary | ICD-10-CM | POA: Diagnosis not present

## 2018-07-17 DIAGNOSIS — I5032 Chronic diastolic (congestive) heart failure: Secondary | ICD-10-CM | POA: Diagnosis not present

## 2018-07-17 DIAGNOSIS — K6389 Other specified diseases of intestine: Secondary | ICD-10-CM | POA: Diagnosis not present

## 2018-07-17 DIAGNOSIS — Z7189 Other specified counseling: Secondary | ICD-10-CM | POA: Diagnosis not present

## 2018-07-17 DIAGNOSIS — C9110 Chronic lymphocytic leukemia of B-cell type not having achieved remission: Secondary | ICD-10-CM | POA: Diagnosis not present

## 2018-07-17 DIAGNOSIS — Z8673 Personal history of transient ischemic attack (TIA), and cerebral infarction without residual deficits: Secondary | ICD-10-CM

## 2018-07-17 DIAGNOSIS — R1084 Generalized abdominal pain: Secondary | ICD-10-CM

## 2018-07-17 DIAGNOSIS — A0472 Enterocolitis due to Clostridium difficile, not specified as recurrent: Secondary | ICD-10-CM | POA: Diagnosis not present

## 2018-07-17 DIAGNOSIS — B3781 Candidal esophagitis: Secondary | ICD-10-CM | POA: Diagnosis not present

## 2018-07-17 DIAGNOSIS — Z515 Encounter for palliative care: Secondary | ICD-10-CM | POA: Diagnosis not present

## 2018-07-17 DIAGNOSIS — D539 Nutritional anemia, unspecified: Secondary | ICD-10-CM | POA: Diagnosis present

## 2018-07-17 DIAGNOSIS — N2581 Secondary hyperparathyroidism of renal origin: Secondary | ICD-10-CM | POA: Diagnosis present

## 2018-07-17 DIAGNOSIS — Z933 Colostomy status: Secondary | ICD-10-CM | POA: Diagnosis not present

## 2018-07-17 DIAGNOSIS — R64 Cachexia: Secondary | ICD-10-CM

## 2018-07-17 DIAGNOSIS — I132 Hypertensive heart and chronic kidney disease with heart failure and with stage 5 chronic kidney disease, or end stage renal disease: Secondary | ICD-10-CM | POA: Diagnosis present

## 2018-07-17 DIAGNOSIS — Z885 Allergy status to narcotic agent status: Secondary | ICD-10-CM | POA: Diagnosis not present

## 2018-07-17 DIAGNOSIS — E118 Type 2 diabetes mellitus with unspecified complications: Secondary | ICD-10-CM | POA: Diagnosis not present

## 2018-07-17 DIAGNOSIS — Z833 Family history of diabetes mellitus: Secondary | ICD-10-CM

## 2018-07-17 DIAGNOSIS — I480 Paroxysmal atrial fibrillation: Secondary | ICD-10-CM | POA: Diagnosis not present

## 2018-07-17 DIAGNOSIS — I252 Old myocardial infarction: Secondary | ICD-10-CM

## 2018-07-17 DIAGNOSIS — Z856 Personal history of leukemia: Secondary | ICD-10-CM

## 2018-07-17 DIAGNOSIS — N186 End stage renal disease: Secondary | ICD-10-CM | POA: Diagnosis not present

## 2018-07-17 DIAGNOSIS — L8990 Pressure ulcer of unspecified site, unspecified stage: Secondary | ICD-10-CM | POA: Insufficient documentation

## 2018-07-17 DIAGNOSIS — E1122 Type 2 diabetes mellitus with diabetic chronic kidney disease: Secondary | ICD-10-CM | POA: Diagnosis present

## 2018-07-17 DIAGNOSIS — I10 Essential (primary) hypertension: Secondary | ICD-10-CM | POA: Diagnosis not present

## 2018-07-17 DIAGNOSIS — F419 Anxiety disorder, unspecified: Secondary | ICD-10-CM | POA: Diagnosis present

## 2018-07-17 DIAGNOSIS — Z96649 Presence of unspecified artificial hip joint: Secondary | ICD-10-CM | POA: Diagnosis present

## 2018-07-17 DIAGNOSIS — Z880 Allergy status to penicillin: Secondary | ICD-10-CM | POA: Diagnosis not present

## 2018-07-17 DIAGNOSIS — E11319 Type 2 diabetes mellitus with unspecified diabetic retinopathy without macular edema: Secondary | ICD-10-CM | POA: Diagnosis present

## 2018-07-17 DIAGNOSIS — K219 Gastro-esophageal reflux disease without esophagitis: Secondary | ICD-10-CM | POA: Diagnosis present

## 2018-07-17 DIAGNOSIS — Z85528 Personal history of other malignant neoplasm of kidney: Secondary | ICD-10-CM

## 2018-07-17 DIAGNOSIS — Z794 Long term (current) use of insulin: Secondary | ICD-10-CM | POA: Diagnosis not present

## 2018-07-17 DIAGNOSIS — R5383 Other fatigue: Secondary | ICD-10-CM

## 2018-07-17 DIAGNOSIS — A0471 Enterocolitis due to Clostridium difficile, recurrent: Principal | ICD-10-CM | POA: Diagnosis present

## 2018-07-17 DIAGNOSIS — R531 Weakness: Secondary | ICD-10-CM | POA: Diagnosis not present

## 2018-07-17 DIAGNOSIS — K580 Irritable bowel syndrome with diarrhea: Secondary | ICD-10-CM | POA: Diagnosis not present

## 2018-07-17 DIAGNOSIS — Z992 Dependence on renal dialysis: Secondary | ICD-10-CM

## 2018-07-17 DIAGNOSIS — E1151 Type 2 diabetes mellitus with diabetic peripheral angiopathy without gangrene: Secondary | ICD-10-CM | POA: Diagnosis not present

## 2018-07-17 DIAGNOSIS — Z951 Presence of aortocoronary bypass graft: Secondary | ICD-10-CM

## 2018-07-17 DIAGNOSIS — Z881 Allergy status to other antibiotic agents status: Secondary | ICD-10-CM | POA: Diagnosis not present

## 2018-07-17 DIAGNOSIS — Z20828 Contact with and (suspected) exposure to other viral communicable diseases: Secondary | ICD-10-CM | POA: Diagnosis present

## 2018-07-17 DIAGNOSIS — K429 Umbilical hernia without obstruction or gangrene: Secondary | ICD-10-CM | POA: Diagnosis not present

## 2018-07-17 DIAGNOSIS — Z8249 Family history of ischemic heart disease and other diseases of the circulatory system: Secondary | ICD-10-CM

## 2018-07-17 DIAGNOSIS — E876 Hypokalemia: Secondary | ICD-10-CM | POA: Diagnosis present

## 2018-07-17 DIAGNOSIS — Z66 Do not resuscitate: Secondary | ICD-10-CM | POA: Diagnosis present

## 2018-07-17 DIAGNOSIS — I5030 Unspecified diastolic (congestive) heart failure: Secondary | ICD-10-CM | POA: Diagnosis not present

## 2018-07-17 DIAGNOSIS — Z7901 Long term (current) use of anticoagulants: Secondary | ICD-10-CM

## 2018-07-17 DIAGNOSIS — E785 Hyperlipidemia, unspecified: Secondary | ICD-10-CM | POA: Diagnosis present

## 2018-07-17 DIAGNOSIS — R413 Other amnesia: Secondary | ICD-10-CM | POA: Diagnosis present

## 2018-07-17 HISTORY — DX: Dependence on renal dialysis: N18.6

## 2018-07-17 LAB — GLUCOSE, CAPILLARY: Glucose-Capillary: 166 mg/dL — ABNORMAL HIGH (ref 70–99)

## 2018-07-17 LAB — C DIFFICILE QUICK SCREEN W PCR REFLEX
C Diff antigen: POSITIVE — AB
C Diff interpretation: DETECTED
C Diff toxin: POSITIVE — AB

## 2018-07-17 LAB — CBC
HCT: 37.5 % (ref 36.0–46.0)
Hemoglobin: 11.7 g/dL — ABNORMAL LOW (ref 12.0–15.0)
MCH: 32.3 pg (ref 26.0–34.0)
MCHC: 31.2 g/dL (ref 30.0–36.0)
MCV: 103.6 fL — ABNORMAL HIGH (ref 80.0–100.0)
Platelets: 218 10*3/uL (ref 150–400)
RBC: 3.62 MIL/uL — ABNORMAL LOW (ref 3.87–5.11)
RDW: 18.8 % — ABNORMAL HIGH (ref 11.5–15.5)
WBC: 24.7 10*3/uL — ABNORMAL HIGH (ref 4.0–10.5)
nRBC: 0.3 % — ABNORMAL HIGH (ref 0.0–0.2)

## 2018-07-17 LAB — COMPREHENSIVE METABOLIC PANEL WITH GFR
ALT: 22 U/L (ref 0–44)
AST: 21 U/L (ref 15–41)
Albumin: 2.5 g/dL — ABNORMAL LOW (ref 3.5–5.0)
Alkaline Phosphatase: 187 U/L — ABNORMAL HIGH (ref 38–126)
Anion gap: 19 — ABNORMAL HIGH (ref 5–15)
BUN: 60 mg/dL — ABNORMAL HIGH (ref 8–23)
CO2: 24 mmol/L (ref 22–32)
Calcium: 8.6 mg/dL — ABNORMAL LOW (ref 8.9–10.3)
Chloride: 92 mmol/L — ABNORMAL LOW (ref 98–111)
Creatinine, Ser: 10.45 mg/dL — ABNORMAL HIGH (ref 0.44–1.00)
GFR calc Af Amer: 4 mL/min — ABNORMAL LOW
GFR calc non Af Amer: 3 mL/min — ABNORMAL LOW
Glucose, Bld: 210 mg/dL — ABNORMAL HIGH (ref 70–99)
Potassium: 4.8 mmol/L (ref 3.5–5.1)
Sodium: 135 mmol/L (ref 135–145)
Total Bilirubin: 1.2 mg/dL (ref 0.3–1.2)
Total Protein: 5 g/dL — ABNORMAL LOW (ref 6.5–8.1)

## 2018-07-17 LAB — LIPASE, BLOOD: Lipase: 17 U/L (ref 11–51)

## 2018-07-17 LAB — SARS CORONAVIRUS 2 BY RT PCR (HOSPITAL ORDER, PERFORMED IN ~~LOC~~ HOSPITAL LAB): SARS Coronavirus 2: NEGATIVE

## 2018-07-17 MED ORDER — CALCITRIOL 0.25 MCG PO CAPS
0.2500 ug | ORAL_CAPSULE | ORAL | Status: DC
  Administered 2018-07-18: 10:00:00 0.25 ug via ORAL

## 2018-07-17 MED ORDER — VITAMIN C 500 MG PO TABS
1000.0000 mg | ORAL_TABLET | Freq: Every day | ORAL | Status: DC
  Administered 2018-07-18 – 2018-07-22 (×5): 1000 mg via ORAL
  Filled 2018-07-17 (×5): qty 2

## 2018-07-17 MED ORDER — OMEGA-3-ACID ETHYL ESTERS 1 G PO CAPS
1000.0000 mg | ORAL_CAPSULE | Freq: Every day | ORAL | Status: DC
  Administered 2018-07-18 – 2018-07-22 (×5): 1000 mg via ORAL
  Filled 2018-07-17 (×5): qty 1

## 2018-07-17 MED ORDER — ACETAMINOPHEN 325 MG PO TABS
650.0000 mg | ORAL_TABLET | Freq: Four times a day (QID) | ORAL | Status: DC | PRN
  Administered 2018-07-18 – 2018-07-20 (×2): 650 mg via ORAL
  Filled 2018-07-17 (×2): qty 2

## 2018-07-17 MED ORDER — APIXABAN 5 MG PO TABS
5.0000 mg | ORAL_TABLET | Freq: Two times a day (BID) | ORAL | Status: DC
  Administered 2018-07-17 – 2018-07-22 (×10): 5 mg via ORAL
  Filled 2018-07-17 (×10): qty 1

## 2018-07-17 MED ORDER — VANCOMYCIN 50 MG/ML ORAL SOLUTION
125.0000 mg | Freq: Four times a day (QID) | ORAL | Status: DC
  Administered 2018-07-17 – 2018-07-20 (×11): 125 mg via ORAL
  Filled 2018-07-17 (×17): qty 2.5

## 2018-07-17 MED ORDER — POLYVINYL ALCOHOL 1.4 % OP SOLN
1.0000 [drp] | Freq: Three times a day (TID) | OPHTHALMIC | Status: DC
  Administered 2018-07-17 – 2018-07-22 (×13): 1 [drp] via OPHTHALMIC
  Filled 2018-07-17: qty 15

## 2018-07-17 MED ORDER — FERRIC CITRATE 1 GM 210 MG(FE) PO TABS
420.0000 mg | ORAL_TABLET | Freq: Three times a day (TID) | ORAL | Status: DC
  Administered 2018-07-18 – 2018-07-22 (×9): 420 mg via ORAL
  Filled 2018-07-17 (×9): qty 2

## 2018-07-17 MED ORDER — INSULIN ASPART 100 UNIT/ML ~~LOC~~ SOLN
0.0000 [IU] | Freq: Three times a day (TID) | SUBCUTANEOUS | Status: DC
  Administered 2018-07-18: 3 [IU] via SUBCUTANEOUS
  Administered 2018-07-18 – 2018-07-20 (×5): 2 [IU] via SUBCUTANEOUS
  Administered 2018-07-21: 3 [IU] via SUBCUTANEOUS
  Administered 2018-07-21: 1 [IU] via SUBCUTANEOUS
  Administered 2018-07-21: 2 [IU] via SUBCUTANEOUS
  Administered 2018-07-22 (×2): 3 [IU] via SUBCUTANEOUS

## 2018-07-17 MED ORDER — CHLORHEXIDINE GLUCONATE CLOTH 2 % EX PADS
6.0000 | MEDICATED_PAD | Freq: Every day | CUTANEOUS | Status: DC
  Administered 2018-07-18 – 2018-07-22 (×5): 6 via TOPICAL

## 2018-07-17 MED ORDER — PAROXETINE HCL 20 MG PO TABS
40.0000 mg | ORAL_TABLET | Freq: Every day | ORAL | Status: DC
  Administered 2018-07-18 – 2018-07-22 (×5): 40 mg via ORAL
  Filled 2018-07-17 (×6): qty 2

## 2018-07-17 MED ORDER — METOPROLOL SUCCINATE ER 25 MG PO TB24
25.0000 mg | ORAL_TABLET | Freq: Every day | ORAL | Status: DC
  Administered 2018-07-17 – 2018-07-19 (×3): 25 mg via ORAL
  Filled 2018-07-17 (×3): qty 1

## 2018-07-17 MED ORDER — MELATONIN 3 MG PO TABS
3.0000 mg | ORAL_TABLET | Freq: Every day | ORAL | Status: DC
  Administered 2018-07-17 – 2018-07-21 (×5): 3 mg via ORAL
  Filled 2018-07-17 (×6): qty 1

## 2018-07-17 MED ORDER — NITROGLYCERIN 0.4 MG SL SUBL: 0.4000 mg | SUBLINGUAL_TABLET | SUBLINGUAL | Status: DC | PRN

## 2018-07-17 MED ORDER — ROSUVASTATIN CALCIUM 20 MG PO TABS
20.0000 mg | ORAL_TABLET | Freq: Every day | ORAL | Status: DC
  Administered 2018-07-18 – 2018-07-22 (×5): 20 mg via ORAL
  Filled 2018-07-17 (×5): qty 1

## 2018-07-17 MED ORDER — ACETAMINOPHEN 650 MG RE SUPP: 650.0000 mg | Freq: Four times a day (QID) | RECTAL | Status: DC | PRN

## 2018-07-17 MED ORDER — SODIUM CHLORIDE 0.9% FLUSH: 3.0000 mL | Freq: Once | INTRAVENOUS | Status: DC

## 2018-07-17 MED ORDER — LIDOCAINE-PRILOCAINE 2.5-2.5 % EX CREA: 1.0000 "application " | TOPICAL_CREAM | CUTANEOUS | Status: DC | PRN

## 2018-07-17 MED ORDER — ADULT MULTIVITAMIN W/MINERALS CH
1.0000 | ORAL_TABLET | Freq: Every day | ORAL | Status: DC
  Administered 2018-07-18 – 2018-07-19 (×2): 1 via ORAL
  Filled 2018-07-17 (×2): qty 1

## 2018-07-17 MED ORDER — PROMETHAZINE HCL 25 MG PO TABS
12.5000 mg | ORAL_TABLET | Freq: Four times a day (QID) | ORAL | Status: DC | PRN
  Administered 2018-07-18: 12.5 mg via ORAL
  Filled 2018-07-17: qty 1

## 2018-07-17 MED ORDER — PANTOPRAZOLE SODIUM 40 MG PO TBEC
40.0000 mg | DELAYED_RELEASE_TABLET | Freq: Every day | ORAL | Status: DC
  Administered 2018-07-18 – 2018-07-20 (×3): 40 mg via ORAL
  Filled 2018-07-17 (×3): qty 1

## 2018-07-17 NOTE — ED Notes (Signed)
Pt reports pain on her bottom, RN assisted pt in repositioning to side so that pressure is off her bottom.

## 2018-07-17 NOTE — ED Notes (Signed)
Christine Santos -friend- [phone]

## 2018-07-17 NOTE — H&P (Addendum)
[facility] Admission History and Physical Service Pager: [phone]  Patient name: Christine Santos Medical record number: [id number] Date of birth: 02/13/1945 Age: 73 y.o. Gender: female  Primary Care Provider: [name], MD Consultants: none Code Status: DNR  Chief Complaint: diarrhea  Assessment and Plan: Christine Santos is a 73 y.o. female presenting with 3 days of nausea, vomiting, diarrhea.  Previous medical history is significant for episodes of C. difficile colitis, ESRD, heart failure with preserved ejection fraction, CAD, diabetes, GERD.  C. Diff colitis She has a history of C. difficile colitis.  She reports 3 days of nausea, vomiting, diarrhea.  She is unsure if this feels similar to her previous episodes of C. difficile.  She reports as many as 2-3 loose, nonbloody bowel movements per hour.  She has not been seen for dialysis since 6/19.  Appears to have called PCP and was recommended to come to ED for evaluation. Vitals on admission were remarkable for tachycardia to 106.  Admission labs are notable for potassium 4.8, BUN 60, creatinine 10.5, anion gap 19, WBC 24.  Stool studies positive for C. difficile toxin and antigen.  CT abdomen shows bowel changes consistent with infectious, inflammatory colitis and diarrheal illness.  Lab results confirm a diagnosis of C. difficile colitis.  Imaging consistent with this diagnosis.  Per chart review, she does not appear to have been treated with a prolonged course of antibiotics in the past 3 months although she was recently hospitalized on 06/06/2018 for a fracture of the femur which led to a hip replacement.  Will admit inpatient rehab treatment with antibiotics to ensure that she shows good resolution of symptoms. -Admit to MedSurg, attending Dr. McDiarmid -P.o. vancomycin 125 mg 4 times daily for 10 days  -Phenergan 12.5 mg every 6 hours as needed for nausea -Monitor CBC -AM BMP -Monitor volume  status -Monitor vitals  ESRD (MWF) Due to not feeling well, she did not go to dialysis on 6/22.  As result, she does have an elevated BUN and anion gap with evidence of mild fluid overload on exam evidenced by lower extremity edema (no fluid overload evidenced on auscultation of the lungs).  Her potassium remains in the normal range at 4.8.  This is likely kept in normal range by her excessive diarrhea.  Nephrology is aware that she is in need of dialysis and is planning for dialysis on 6/24. -Nephrology aware -Dialysis in a.m. -Monitor electrolytes  History of A.fib Tachycardia with A. fib noted on admission with tachycardia up to 125.  EKG on admission showing A fib. She reports that she did not take any medication today.  This elevated heart rate is likely secondary to lack of medication today in addition to her acute infection.  No evidence of bleeding (no hematochezia).  We will continue home Eliquis and metoprolol. -elequis 5 mg daily -Toprol succinate 25 mg daily -AM EKG  DM She reports that her typical regimen includes Lantus 12 units daily in addition to aspart 5 units with each meal.  She reports that she has had a decreased appetite and has not had any insulin in the past day.  Glucose on admission was 210.  We will hold long-acting insulin at this time and continue with the sliding scale until she recovers her appetite. -SSI sensitive -Monitor CBGs  HFpEF Last echo (08/2015) shows EF of 55-60% with grade 1 diastolic dysfunction.  Though physical exam does show increasing lower extremity edema, there is no  evidence of respiratory distress or rales on auscultation of the lungs.  She does not appear to be in a heart failure exacerbation. -Metoprolol as above  CAD s/p ICD -Rosuvastatin 20 mg -Nitroglycerin SL as needed  GERD -protonix 40 mg   Anxiety -paxel 40 mg   Memory loss Recent visit from PCP on 6/17 reporting memory loss with concern for dementia/delerium/depression.  Was instructed to follow up. Patient was also noted to have physical deconditioning.  -PT/OT -consider MOCA eval by SLP  FEN/GI: Renal/carb modified Prophylaxis: Heparin  Disposition: Pending clinical improvement  History of Present Illness:  Christine Santos is a 73 y.o. female presenting with 3 days of nausea, vomiting, diarrhea.  Previous medical history is significant for episodes of C. difficile colitis, ESRD, heart failure with preserved ejection fraction, CAD, diabetes, GERD.  She reports that she began experiencing symptoms of diarrhea and vomiting about 3 days ago.  For the past 3 days she has been slowly worsening.  She now reports as many as 2-3 bowel movements per hour.  Bowel movements are loose, malodorous, nonbloody.  She is not complaining of stomach pain though she does experience nausea with nonbloody diarrhea.  She reports that she has been afebrile at home as far she is aware.  In the ED, she was found to have stool studies positive for C. difficile toxin and antigen in addition to a white count of 24 and CT abdomen findings consistent with infectious colitis.  P.o. vancomycin was initiated in the ED.  Review Of Systems: Per HPI with the following additions:   Review of Systems  Constitutional: Positive for malaise/fatigue. Negative for fever and weight loss.  HENT: Negative for congestion, hearing loss and sore throat.   Eyes: Negative for blurred vision.  Respiratory: Positive for shortness of breath. Negative for cough.   Cardiovascular: Positive for leg swelling. Negative for chest pain and palpitations.  Gastrointestinal: Positive for diarrhea, nausea and vomiting. Negative for abdominal pain and blood in stool.  Genitourinary: Negative for dysuria.  Musculoskeletal: Negative for joint pain and myalgias.  Skin: Negative for rash.  Neurological: Positive for dizziness and weakness. Negative for tremors, focal weakness and headaches.  Endo/Heme/Allergies: Negative for  polydipsia.    Patient Active Problem List   Diagnosis Date Noted  . Clostridium difficile colitis 07/17/2018  . Polypharmacy 07/11/2018  . Memory loss 07/11/2018  . Pain in left hand 06/27/2018  . Ileus ([place])   . Closed fracture of neck of right femur ([place])   . Displaced intertrochanteric fracture of right femur, initial encounter for closed fracture ([place]) 06/07/2018  . Controlled type 2 diabetes mellitus with complication, with long-term current use of insulin ([place])   . High risk social situation 02/05/2018  . ESRD on dialysis ([place])   . Pain in right hip 02/16/2017  . History of stroke 01/20/2017  . Physical deconditioning 12/30/2016  . Transaminitis 12/23/2016  . Paroxysmal atrial fibrillation ([place]) 10/22/2015  . Laryngopharyngeal reflux (LPR) 09/14/2015  . Diabetes mellitus type 2 with neurological manifestations ([place]) 02/26/2014  . Diabetic nephropathy ([place]) 05/23/2013  . Diabetic neuropathy ([place]) 11/30/2012  . Macular degeneration 07/04/2012  . Diabetic retinopathy ([place]) 03/20/2012  . Osteopenia 09/22/2011  . Anemia in chronic kidney disease 08/29/2011  . Gout 04/13/2011  . Anxiety 08/18/2010  . Chronic systolic heart failure ([place]) 02/18/2009  . SINUS BRADYCARDIA 10/29/2008  . Peripheral vascular disease ([place]) 06/18/2008  . ESRD (end stage renal disease) on dialysis ([place]) 07/13/2007  . Multiple complications  of type II diabetes mellitus ([place]) 03/23/2006  . HYPERCHOLESTEROLEMIA 03/23/2006  . Essential hypertension 03/23/2006  . Coronary atherosclerosis 03/23/2006  . Reflux esophagitis 03/23/2006  . CYSTOCELE/RECTOCELE/PROLAPSE,UNSPEC. 03/23/2006  . Osteoarthritis, multiple sites 03/23/2006    Past Medical History: Past Medical History:  Diagnosis Date  . Anemia 08/29/2011  . Anxiety   . Arthritis    "legs" (01/22/2013)  . CAD (coronary artery disease)    s/p CABG in 1999  . Cataract   . Chronic kidney disease    Sees Dr [name]  . Chronic lymphocytic leukemia  ([place]) 10/13/2006   Qualifier: Diagnosis of  By: Andria Frames MD, Gwyndolyn Saxon    . 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 Diastolic heart failure ([place])   . Dysrhythmia    PAF  . Enteritis due to Clostridium difficile 07/02/2017  . GERD (gastroesophageal reflux disease)   . Gout   . H/O hiatal hernia   . Headache    "years ago migraines, none in along time."  . Heart murmur   . Hyperlipidemia   . Hypertension   . IDDM (insulin dependent diabetes mellitus) ([place])    Type II  . Myocardial infarction ([place])    before 1995  . PAD (peripheral artery disease) ([place])   . Paroxysmal atrial fibrillation (H[facility])    a. identified on ILR as part of StrokeAF study  . Peripheral vascular disease ([place])   . Pneumonia 11/2016  . Stress incontinence, female 11/11/2010  . Stroke (cerebrum) ([place]) 08/2015   "seeing double for 2 weeks" vision normal now. Still has balance issues 03/05/2018- vision improved, balance improved , back on walker since hospitaization,     Past Surgical History: Past Surgical History:  Procedure Laterality Date  . A/V FISTULAGRAM Left 12/25/2017   Procedure: A/V FISTULAGRAM;  Surgeon: Waynetta Sandy, MD;  Location: [place] [facility];  Service: Cardiovascular;  Laterality: Left;  . A/V SHUNT INTERVENTION Left 12/25/2017   Procedure: A/V SHUNT INTERVENTION;  Surgeon: Waynetta Sandy, MD;  Location: [place] [facility];  Service: Cardiovascular;  Laterality: Left;  . ANGIOPLASTY / STENTING FEMORAL Right 09/2010   SFA/notes 11/25/2010 (01/22/2013)  . ANGIOPLASTY / STENTING FEMORAL Left 11/2010   SFA/notes 11/25/2010 (01/22/2013)  . ANGIOPLASTY / STENTING ILIAC     [name] 11/25/2010 (01/22/2013)  . AV FISTULA PLACEMENT Left 03/07/2018   Procedure: CONVERSION TO  ARTERIOVENOUS Artegraft GRAFT ARM;  Surgeon: Waynetta Sandy, MD;  Location: [place];  Service: Vascular;  Laterality: Left;  . BASCILIC VEIN TRANSPOSITION Left 08/23/2017   Procedure:  FIRST STAGE BASILIC VEIN TRANSPOSITION LEFT UPPER EXTREMITY;  Surgeon: [name], MD;  Location: [place];  Service: Vascular;  Laterality: Left;  . BASCILIC VEIN TRANSPOSITION Left 10/23/2017   Procedure: SECOND STAGE BASILIC VEIN TRANSPOSITION LEFT ARM;  Surgeon: [name], MD;  Location: [place];  Service: Vascular;  Laterality: Left;  . CATARACT EXTRACTION W/ INTRAOCULAR LENS  IMPLANT, BILATERAL Bilateral ?2011  . CHOLECYSTECTOMY  1993  . CORONARY ARTERY BYPASS GRAFT  10/1994   "CABG X3"  . EP IMPLANTABLE DEVICE N/A 09/11/2015   Procedure: Loop Recorder Insertion;  Surgeon: Thompson Grayer, MD;  Location: [place] [facility];  Service: Cardiovascular;  Laterality: N/A;  . HEEL SPUR EXCISION Bilateral 1970's  . INTRAMEDULLARY (IM) NAIL INTERTROCHANTERIC Right 06/08/2018   Procedure: INTRAMEDULLARY (IM) NAIL INTERTROCHANTRIC;  Surgeon: Leandrew Koyanagi, MD;  Location: [facility];  Service: Orthopedics;  Laterality: Right;  . IR  FLUORO GUIDE CV LINE RIGHT  11/01/2017  . IR FLUORO GUIDE CV LINE RIGHT  11/05/2017  . IR US GUIDE VASC ACCESS RIGHT  11/01/2017  . IR US GUIDE VASC ACCESS RIGHT  11/05/2017  . LOWER EXTREMITY ANGIOGRAM N/A 01/22/2013   Procedure: LOWER EXTREMITY ANGIOGRAM;  Surgeon: [name], MD;  Location: [facility] CATH LAB;  Service: Cardiovascular;  Laterality: N/A;  . SHOULDER OPEN ROTATOR CUFF REPAIR Bilateral 1990's   "2 times on 1 side; once on the other"  . TRANSLUMINAL ATHERECTOMY FEMORAL ARTERY Right 01/22/2013   & balloon  . TUBAL LIGATION  1980's  . VIDEO ASSISTED THORACOSCOPY (VATS)/EMPYEMA Left 11/29/2016   Procedure: VIDEO ASSISTED THORACOSCOPY (VATS)/EMPYEMA;  Surgeon: [name], MD;  Location: [facility] OR;  Service: Thoracic;  Laterality: Left;  VIDEO ASSISTED THORACOSCOPY (VATS)/EMPYEMA    Social History: Social History   Tobacco Use  . Smoking status: Never Smoker  . Smokeless tobacco: Never Used  Substance Use Topics  . Alcohol use: No  . Drug use: No    Additional social history: lives with husband  Please also refer to relevant sections of EMR.  Family History: Family History  Problem Relation Age of Onset  . Heart disease Mother   . Heart attack Mother   . Hypertension Mother   . Kidney disease Mother   . Pneumonia Father   . Heart attack Father   . Hyperlipidemia Father   . Diabetes Sister   . Breast cancer Sister   . Arthritis Brother   . Diabetes Brother   . Heart attack Sister   . Obesity Sister   . Hypertension Sister   . Heart attack Sister   . Cancer Sister        breast  . Hyperlipidemia Sister   . Hypertension Sister   . Hyperlipidemia Sister   . Breast cancer Maternal Grandmother      Allergies and Medications: Allergies  Allergen Reactions  . Cefepime Anaphylaxis    Throat swelling  . Amoxicillin Rash    Did it involve swelling of the face/tongue/throat, SOB, or low BP? No Did it involve sudden or severe rash/hives, skin peeling, or any reaction on the inside of your mouth or nose? No Did you need to seek medical attention at a hospital or doctor's office? No When did it last happen?10+ years If all above answers are "NO", may proceed with cephalosporin use.    . Codeine Phosphate Rash  . Penicillins Rash    Did it involve swelling of the face/tongue/throat, SOB, or low BP? No Did it involve sudden or severe rash/hives, skin peeling, or any reaction on the inside of your mouth or nose? No Did you need to seek medical attention at a hospital or doctor's office? No When did it last happen?10+ years If all above answers are "NO", may proceed with cephalosporin use.     Marland Kitchen Propoxyphene Rash   No current facility-administered medications on file prior to encounter.    Current Outpatient Medications on File Prior to Encounter  Medication Sig Dispense Refill  . ACCU-CHEK AVIVA PLUS test strip TEST THREE TIMES A DAY 300 each 3  . acetaminophen (TYLENOL) 500 MG tablet Take 1,000 mg by  mouth every 6 (six) hours as needed for moderate pain or headache.    . allopurinol (ZYLOPRIM) 100 MG tablet Take 0.5 tablets (50 mg total) by mouth daily. 45 tablet 3  . apixaban (ELIQUIS) 5 MG TABS tablet Take 1 tablet (5 mg total)  by mouth 2 (two) times daily. 180 tablet 3  . Ascorbic Acid (VITAMIN C) 1000 MG tablet Take 1,000 mg by mouth daily.    . calcitRIOL (ROCALTROL) 0.25 MCG capsule Take 1 capsule (0.25 mcg total) by mouth every Monday, Wednesday, and Friday with hemodialysis. 39 capsule 3  . carboxymethylcellulose (REFRESH PLUS) 0.5 % SOLN Place 1 drop into both eyes 3 (three) times daily.     . ferric citrate (AURYXIA) 1 GM 210 MG(Fe) tablet Take 2 tablets (420 mg total) by mouth 3 (three) times daily with meals. 270 tablet   . GNP GARLIC EXTRACT PO Take 1 tablet by mouth daily.     Marland Kitchen HUMALOG 100 UNIT/ML injection Inject 5 Units into the skin 3 (three) times daily before meals.    . Insulin Glargine (LANTUS SOLOSTAR) 100 UNIT/ML Solostar Pen Inject 12 Units into the skin daily. 5 pen 3  . insulin lispro (HUMALOG KWIKPEN) 100 UNIT/ML KwikPen INJECT 5 UNITS UNDER THE SKIN THREE TIMES DAILY BEFORE MEALS 18 mL 3  . LANTUS 100 UNIT/ML injection Inject 5 Units into the skin at bedtime.    . lidocaine-prilocaine (EMLA) cream Apply 1 application topically See admin instructions. APPLY SMALL AMOUNT TO ACCESS SITE (AVF) 1 TO 2 HOURS BEFORE DIALYSIS. COVER WITH OCCLUSIVE DRESSING (SARAN WRAP)    . Melatonin 10 MG CAPS Take 10 mg by mouth at bedtime.     . metoprolol succinate (TOPROL-XL) 25 MG 24 hr tablet Take 1 tablet (25 mg total) by mouth daily. 90 tablet 3  . Multiple Vitamins-Minerals (EYE VITAMINS PO) Take 1 capsule by mouth 2 (two) times daily. Taking one daily from her opthamologist (Dr. Herbert Deaner)     . Multiple Vitamins-Minerals (MULTIVITAMINS THER. W/MINERALS) TABS Take 1 tablet by mouth daily.      . nitroGLYCERIN (NITROSTAT) 0.4 MG SL tablet Place 1 tablet (0.4 mg total) under the tongue  every 5 (five) minutes as needed for chest pain. 25 tablet 3  . Omega-3 Fatty Acids (FISH OIL) 1000 MG CAPS Take 1,000 mg by mouth daily.    . pantoprazole (PROTONIX) 40 MG tablet Take 1 tablet (40 mg total) by mouth daily. 90 tablet 3  . PARoxetine (PAXIL) 30 MG tablet Take 30 mg by mouth daily.    Marland Kitchen PARoxetine (PAXIL) 40 MG tablet Take 1 tablet (40 mg total) by mouth every morning. 90 tablet 3  . rosuvastatin (CRESTOR) 20 MG tablet TAKE 1 TABLET(20 MG) BY MOUTH DAILY 90 tablet 3    Objective: BP 125/62 (BP Location: Right Arm)   Pulse (!) 102   Temp 98.3 F (36.8 C) (Oral)   Resp 16   Ht 5\' 5"  (1.651 m)   Wt 71.6 kg   SpO2 94%   BMI 26.27 kg/m  Exam: Physical Exam Constitutional:      General: She is not in acute distress.    Appearance: Normal appearance. She is normal weight. She is ill-appearing. She is not toxic-appearing.  HENT:     Nose: Nose normal. No congestion.     Mouth/Throat:     Mouth: Mucous membranes are dry.     Pharynx: Oropharynx is clear.  Eyes:     Conjunctiva/sclera: Conjunctivae normal.     Pupils: Pupils are equal, round, and reactive to light.  Neck:     Musculoskeletal: Normal range of motion and neck supple.  Cardiovascular:     Rate and Rhythm: Tachycardia present. Rhythm irregular.     Pulses: Normal pulses.  Heart sounds: No murmur.  Pulmonary:     Effort: Pulmonary effort is normal.     Breath sounds: Normal breath sounds.  Abdominal:     General: Abdomen is flat. Bowel sounds are normal.     Palpations: Abdomen is soft.  Musculoskeletal:     Right lower leg: Edema present.     Left lower leg: Edema present.  Lymphadenopathy:     Cervical: No cervical adenopathy.  Neurological:     Mental Status: She is alert.     Labs and Imaging: CBC BMET  Recent Labs  Lab 07/17/18 1241  WBC 24.7*  HGB 11.7*  HCT 37.5  PLT 218   Recent Labs  Lab 07/17/18 1241  NA 135  K 4.8  CL 92*  CO2 24  BUN 60*  CREATININE 10.45*   GLUCOSE 210*  CALCIUM 8.6*     Ct Abdomen Pelvis Wo Contrast  Result Date: 07/17/2018 CLINICAL DATA:  Abdominal pain, gastroenteritis or colitis suspected EXAM: CT ABDOMEN AND PELVIS WITHOUT CONTRAST TECHNIQUE: Multidetector CT imaging of the abdomen and pelvis was performed following the standard protocol without IV contrast. COMPARISON:  10/27/2017 FINDINGS: Lower chest: No acute abnormality. Cardiomegaly and extensive 3 vessel coronary artery calcifications and/or stents. Hepatobiliary: No focal liver abnormality is seen. Status post cholecystectomy. No biliary dilatation. Pancreas: Unremarkable. No pancreatic ductal dilatation or surrounding inflammatory changes. Spleen: Normal in size without significant abnormality. Adrenals/Urinary Tract: Adrenal glands are unremarkable. Atrophic kidneys. No renal calculi, solid lesion, or hydronephrosis. Bladder is unremarkable. Stomach/Bowel: Stomach is within normal limits. Appendix appears normal. The colon is mildly distended throughout, somewhat featureless, and fluid-filled to the rectum. Vascular/Lymphatic: Aortic atherosclerosis and vascular calcinosis. No enlarged abdominal or pelvic lymph nodes. Reproductive: No mass or other significant abnormality. Other: Fat containing midline ventral hernia unchanged from prior. No abdominopelvic ascites. Musculoskeletal: No acute or significant osseous findings. IMPRESSION: 1. The colon is mildly distended throughout, somewhat featureless, and fluid-filled to the rectum. Findings are suggestive of nonspecific infectious, inflammatory, or ischemic colitis and diarrheal illness. 2. Other chronic, incidental, and postoperative findings as detailed above. Electronically Signed   By: Eddie Candle M.D.   On: 07/17/2018 14:39   Xr Femur, Min 2 Views Right  Result Date: 06/27/2018 X-rays demonstrate stable fixation of the hardware without complication and stable alignment of the fracture  Xr Hand Complete Left  Result  Date: 06/27/2018 X-rays demonstrate a questionable lucency through the PIP joint diffuse degenerative changes and periarticular spurring.  Otherwise, no acute findings    Matilde Haymaker, MD 07/17/2018, 4:49 PM PGY-1, [place] Intern pager: [phone], text pages welcome  FPTS Upper-Level Resident Addendum   I have independently interviewed and examined the patient. I have discussed the above with the original author and agree with their documentation. My edits for correction/addition/clarification are in blue. Please see also any attending notes.   Caroline More, DO PGY-2, [facility] 07/17/2018 9:32 PM  FPTS Service pager: [phone] (text pages welcome through [place])

## 2018-07-17 NOTE — ED Notes (Signed)
Pt attempting to give stool sample on bedside commode

## 2018-07-17 NOTE — Telephone Encounter (Signed)
[name] calling because she is very concerned about pt.  States that she is weak and still having diarrhea and vomiting.  Was told to go to ED on 6/20 and never went.   Advised I would call and speak with patient.  At first boyfriend and pt are adamant that they will not go to ED.  Advised that she may need fluids and her sugars may be uncontrolled along with many other things.  Boyfriend states that "He does not believe in EDs" and thinks that she just has cdiff probably.   Expressed to pt that we would need to test for that and again recommend that she go to the ED.  He finally said "well its up to [place]".  [facility] RN walked in and I was able to speak with her. She is concerned as well. She will check vitals and continue to encourage ED.  Will call back with issues. Christen Bame, CMA

## 2018-07-17 NOTE — ED Triage Notes (Signed)
Pt. Missed 2 treatments of dialysis

## 2018-07-17 NOTE — ED Notes (Signed)
Sister Christine Santos called, pt gave RN permission to speak to sister.  RN updated family member and then gave phone to pt to speak to her sister.

## 2018-07-17 NOTE — ED Notes (Signed)
Randall Hiss (son) [phone] given update

## 2018-07-17 NOTE — ED Provider Notes (Signed)
[facility] EMERGENCY DEPARTMENT Provider Note   CSN: [id number] Arrival date & time: 07/17/18  1233    History   Chief Complaint Chief Complaint  Patient presents with  . Diarrhea  . Nausea  . Emesis    HPI Christine Santos is a 74 y.o. female with a past medical history of CLL, CAD, GERD, C. Difficile enteritis, ESRD on dialysis MWF, who presents to ED for diarrhea and vomiting.  States that she has had diarrhea since 07/12/2018.  She reports 5-6 episodes of nonbloody diarrhea since then.  She began with vomiting this morning.  Emesis is nonbloody, nonbilious.  She is unsure if this feels similar to the time that she was diagnosed with C. difficile several years ago.  She denies any recent antibiotic use, fever, cough, shortness of breath, chest pain.  She does endorse generalized abdominal pain but denies any urinary symptoms.  No sick contacts with similar symptoms.  She did miss her dialysis session yesterday but did complete it last Friday.  She denies any loss of consciousness, recent travel, suspicious food ingestions.     HPI  Past Medical History:  Diagnosis Date  . Anemia 08/29/2011  . Anxiety   . Arthritis    "legs" (01/22/2013)  . CAD (coronary artery disease)    s/p CABG in 1999  . Cataract   . Chronic kidney disease    Sees Dr [name]  . Chronic lymphocytic leukemia ([place]) 10/13/2006   Qualifier: Diagnosis of  By: Andria Frames MD, Gwyndolyn Saxon    . 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 Diastolic heart failure ([place])   . Dysrhythmia    PAF  . Enteritis due to Clostridium difficile 07/02/2017  . GERD (gastroesophageal reflux disease)   . Gout   . H/O hiatal hernia   . Headache    "years ago migraines, none in along time."  . Heart murmur   . Hyperlipidemia   . Hypertension   . IDDM (insulin dependent diabetes mellitus) ([place])    Type II  . Myocardial infarction ([place])    before 1995  . PAD (peripheral artery disease) ([place])   .  Paroxysmal atrial fibrillation (HCC)    a. identified on ILR as part of StrokeAF study  . Peripheral vascular disease ([place])   . Pneumonia 11/2016  . Stress incontinence, female 11/11/2010  . 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 Still has balance issues 03/05/2018- vision improved, balance improved , back on walker since hospitaization,     Patient Active Problem List   Diagnosis Date Noted  . Polypharmacy 07/11/2018  . Memory loss 07/11/2018  . Pain in left hand 06/27/2018  . Ileus ([place])   . Closed fracture of neck of right femur ([employer])   . Displaced intertrochanteric fracture of right femur, initial encounter for closed fracture ([place]) 06/07/2018  . Controlled type 2 diabetes mellitus with complication, with long-term current use of insulin ([place])   . High risk social situation 02/05/2018  . ESRD on dialysis ([place])   . Pain in right hip 02/16/2017  . History of stroke 01/20/2017  . Physical deconditioning 12/30/2016  . Transaminitis 12/23/2016  . Paroxysmal atrial fibrillation ([place]) 10/22/2015  . Laryngopharyngeal reflux (LPR) 09/14/2015  . Diabetes mellitus type 2 with neurological manifestations ([place]) 02/26/2014  . Diabetic nephropathy ([place]) 05/23/2013  . Diabetic neuropathy ([place]) 11/30/2012  . Macular degeneration 07/04/2012  . Diabetic retinopathy ([place]) 03/20/2012  .  Osteopenia 09/22/2011  . Anemia in chronic kidney disease 08/29/2011  . Gout 04/13/2011  . Anxiety 08/18/2010  . Chronic systolic heart failure ([place]) 02/18/2009  . SINUS BRADYCARDIA 10/29/2008  . Peripheral vascular disease ([place]) 06/18/2008  . ESRD (end stage renal disease) on dialysis ([place]) 07/13/2007  . 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 HYPERCHOLESTEROLEMIA 03/23/2006  . Essential hypertension 03/23/2006  . Coronary atherosclerosis 03/23/2006  . Reflux esophagitis 03/23/2006  . CYSTOCELE/RECTOCELE/PROLAPSE,UNSPEC. 03/23/2006   . Osteoarthritis, multiple sites 03/23/2006    Past Surgical History:  Procedure Laterality Date  . A/V FISTULAGRAM Left 12/25/2017   Procedure: A/V FISTULAGRAM;  Surgeon: Waynetta Sandy, MD;  Location: [facility];  Service: Cardiovascular;  Laterality: Left;  . A/V SHUNT INTERVENTION Left 12/25/2017   Procedure: A/V SHUNT INTERVENTION;  Surgeon: Waynetta Sandy, MD;  Location: [facility];  Service: Cardiovascular;  Laterality: Left;  . ANGIOPLASTY / STENTING FEMORAL Right 09/2010   SFA/notes 11/25/2010 (01/22/2013)  . ANGIOPLASTY / STENTING FEMORAL Left 11/2010   SFA/notes 11/25/2010 (01/22/2013)  . ANGIOPLASTY / STENTING ILIAC     [name] 11/25/2010 (01/22/2013)  . AV FISTULA PLACEMENT Left 03/07/2018   Procedure: CONVERSION TO  ARTERIOVENOUS Artegraft GRAFT ARM;  Surgeon: Waynetta Sandy, MD;  Location: [facility];  Service: Vascular;  Laterality: Left;  . BASCILIC VEIN TRANSPOSITION Left 08/23/2017   Procedure: FIRST STAGE BASILIC VEIN TRANSPOSITION LEFT UPPER EXTREMITY;  Surgeon: [name], MD;  Location: [facility];  Service: Vascular;  Laterality: Left;  . BASCILIC VEIN TRANSPOSITION Left 10/23/2017   Procedure: SECOND STAGE BASILIC VEIN TRANSPOSITION LEFT ARM;  Surgeon: [name], MD;  Location: [facility];  Service: Vascular;  Laterality: Left;  . CATARACT EXTRACTION W/ INTRAOCULAR LENS  IMPLANT, BILATERAL Bilateral ?2011  . CHOLECYSTECTOMY  1993  . CORONARY ARTERY BYPASS GRAFT  10/1994   "CABG X3"  . EP IMPLANTABLE DEVICE N/A 09/11/2015   Procedure: Loop Recorder Insertion;  Surgeon: Thompson Grayer, MD;  Location: [facility];  Service: Cardiovascular;  Laterality: N/A;  . HEEL SPUR EXCISION Bilateral 1970's  . INTRAMEDULLARY (IM) NAIL INTERTROCHANTERIC Right 06/08/2018   Procedure: INTRAMEDULLARY (IM) NAIL INTERTROCHANTRIC;  Surgeon: Leandrew Koyanagi, MD;  Location: [facility];  Service: Orthopedics;  Laterality: Right;  . IR FLUORO GUIDE CV  LINE RIGHT  11/01/2017  . IR FLUORO GUIDE CV LINE RIGHT  11/05/2017  . IR US GUIDE VASC ACCESS RIGHT  11/01/2017  . IR US GUIDE VASC ACCESS RIGHT  11/05/2017  . LOWER EXTREMITY ANGIOGRAM N/A 01/22/2013   Procedure: LOWER EXTREMITY ANGIOGRAM;  Surgeon: [name], MD;  Location: [facility] C[facility] LAB;  Service: Cardiovascular;  Laterality: N/A;  . SHOULDER OPEN ROTATOR CUFF REPAIR Bilateral 1990's   "2 times on 1 side; once on the other"  . TRANSLUMINAL ATHERECTOMY FEMORAL ARTERY Right 01/22/2013   & balloon  . TUBAL LIGATION  1980's  . VIDEO ASSISTED THORACOSCOPY (VATS)/EMPYEMA Left 11/29/2016   Procedure: VIDEO ASSISTED THORACOSCOPY (VATS)/EMPYEMA;  Surgeon: Prescott Gum, Collier Salina, MD;  Location: [facility];  Service: Thoracic;  Laterality: Left;  VIDEO ASSISTED THORACOSCOPY (VATS)/EMPYEMA     OB History   No obstetric history on file.      Home Medications    Prior to Admission medications   Medication Sig Start Date End Date Taking? Authorizing Provider  ACCU-CHEK AVIVA PLUS test strip TEST THREE TIMES A DAY 04/26/17   [name], MD  acetaminophen (TYLENOL)  500 MG tablet Take 1,000 mg by mouth every 6 (six) hours as needed for moderate pain or headache.    [provider]  allopurinol (ZYLOPRIM) 100 MG tablet Take 0.5 tablets (50 mg total) by mouth daily. 07/11/18   [name], MD  apixaban (ELIQUIS) 5 MG TABS tablet Take 1 tablet (5 mg total) by mouth 2 (two) times daily. 07/11/18   [name], MD  Ascorbic Acid (VITAMIN C) 1000 MG tablet Take 1,000 mg by mouth daily.    [provider]  calcitRIOL (ROCALTROL) 0.25 MCG capsule Take 1 capsule (0.25 mcg total) by mouth every Monday, Wednesday, and Friday with hemodialysis. 07/11/18   [name], MD  carboxymethylcellulose (REFRESH PLUS) 0.5 % SOLN Place 1 drop into both eyes 3 (three) times daily.     [provider]  ferric citrate (AURYXIA) 1 GM 210 MG(Fe) tablet Take 2 tablets (420 mg total) by  mouth 3 (three) times daily with meals. 06/14/18   Meccariello, Bernita Raisin, DO  GNP GARLIC EXTRACT PO Take 1 tablet by mouth daily.     [provider]  Insulin Glargine (LANTUS SOLOSTAR) 100 UNIT/ML Solostar Pen Inject 12 Units into the skin daily. 07/11/18   [name], MD  insulin lispro (HUMALOG KWIKPEN) 100 UNIT/ML KwikPen INJECT 5 UNITS UNDER THE SKIN THREE TIMES DAILY BEFORE MEALS 07/11/18   Hensel, Jamal Collin, MD  lidocaine-prilocaine (EMLA) cream APPLY SMALL AMOUNT TO ACCESS SITE (AVF) 1 TO 2 HOURS BEFORE DIALYSIS. COVER WITH OCCLUSIVE DRESSING (SARAN WRAP) 06/18/18   [provider]  Melatonin 10 MG CAPS Take 10 mg by mouth at bedtime.     [provider]  metoprolol succinate (TOPROL-XL) 25 MG 24 hr tablet Take 1 tablet (25 mg total) by mouth daily. 07/11/18   [name], MD  Multiple Vitamins-Minerals (EYE VITAMINS PO) Take 1 capsule by mouth 2 (two) times daily. Taking one daily from her opthamologist (Dr. Herbert Deaner)     [provider]  Multiple Vitamins-Minerals (MULTIVITAMINS THER. W/MINERALS) TABS Take 1 tablet by mouth daily.      [provider]  nitroGLYCERIN (NITROSTAT) 0.4 MG SL tablet Place 1 tablet (0.4 mg total) under the tongue every 5 (five) minutes as needed for chest pain. 07/10/18   [name], MD  Omega-3 Fatty Acids (FISH OIL) 1000 MG CAPS Take 1,000 mg by mouth daily.    [provider]  pantoprazole (PROTONIX) 40 MG tablet Take 1 tablet (40 mg total) by mouth daily. 07/11/18   [name], MD  PARoxetine (PAXIL) 40 MG tablet Take 1 tablet (40 mg total) by mouth every morning. 07/11/18   [name], MD  rosuvastatin (CRESTOR) 20 MG tablet TAKE 1 TABLET(20 MG) BY MOUTH DAILY 07/11/18   [name], MD    Family History Family History  Problem Relation Age of Onset  . Heart disease Mother   . Heart attack Mother   . Hypertension Mother   . Kidney disease Mother   . Pneumonia Father    . Heart attack Father   . Hyperlipidemia Father   . Diabetes Sister   . Breast cancer Sister   . Arthritis Brother   . Diabetes Brother   . Heart attack Sister   . Obesity Sister   . Hypertension Sister   . Heart attack Sister   . Cancer Sister        breast  . Hyperlipidemia Sister   .  Hypertension Sister   . Hyperlipidemia Sister   . Breast cancer Maternal Grandmother     Social History Social History   Tobacco Use  . Smoking status: Never Smoker  . Smokeless tobacco: Never Used  Substance Use Topics  . Alcohol use: No  . Drug use: No     Allergies   Cefepime, Amoxicillin, Codeine phosphate, Penicillins, and Propoxyphene   Review of Systems Review of Systems  Constitutional: Negative for appetite change, chills and fever.  HENT: Negative for ear pain, rhinorrhea, sneezing and sore throat.   Eyes: Negative for photophobia and visual disturbance.  Respiratory: Negative for cough, chest tightness, shortness of breath and wheezing.   Cardiovascular: Negative for chest pain and palpitations.  Gastrointestinal: Positive for abdominal pain, diarrhea and vomiting. Negative for blood in stool, constipation and nausea.  Genitourinary: Negative for dysuria, hematuria and urgency.  Musculoskeletal: Negative for myalgias.  Skin: Negative for rash.  Neurological: Negative for dizziness, weakness and light-headedness.     Physical Exam Updated Vital Signs BP 106/82   Pulse (!) 102   Temp 98.3 F (36.8 C) (Oral)   Resp 15   Ht 5\' 5"  (1.651 m)   Wt 71.6 kg   SpO2 100%   BMI 26.27 kg/m   Physical Exam Vitals signs and nursing note reviewed.  Constitutional:      General: She is not in acute distress.    Appearance: She is well-developed.  HENT:     Head: Normocephalic and atraumatic.     Nose: Nose normal.  Eyes:     General: No scleral icterus.       Left eye: No discharge.     Conjunctiva/sclera: Conjunctivae normal.  Neck:     Musculoskeletal: Normal  range of motion and neck supple.  Cardiovascular:     Rate and Rhythm: Normal rate and regular rhythm.     Heart sounds: Normal heart sounds. No murmur. No friction rub. No gallop.   Pulmonary:     Effort: Pulmonary effort is normal. No respiratory distress.     Breath sounds: Normal breath sounds.  Abdominal:     General: Bowel sounds are normal. There is no distension.     Palpations: Abdomen is soft.     Tenderness: There is abdominal tenderness (generalized). There is no guarding.  Musculoskeletal: Normal range of motion.  Skin:    General: Skin is warm and dry.     Findings: No rash.  Neurological:     Mental Status: She is alert.     Motor: No abnormal muscle tone.     Coordination: Coordination normal.      ED Treatments / Results  Labs (all labs ordered are listed, but only abnormal results are displayed) Labs Reviewed  C DIFFICILE QUICK SCREEN W PCR REFLEX - Abnormal; Notable for the following components:      Result Value   C Diff antigen POSITIVE (*)    C Diff toxin POSITIVE (*)    All other components within normal limits  COMPREHENSIVE METABOLIC PANEL - Abnormal; Notable for the following components:   Chloride 92 (*)    Glucose, Bld 210 (*)    BUN 60 (*)    Creatinine, Ser 10.45 (*)    Calcium 8.6 (*)    Total Protein 5.0 (*)    Albumin 2.5 (*)    Alkaline Phosphatase 187 (*)    GFR calc non Af Amer 3 (*)    GFR calc Af Amer 4 (*)  Anion gap 19 (*)    All other components within normal limits  CBC - Abnormal; Notable for the following components:   WBC 24.7 (*)    RBC 3.62 (*)    Hemoglobin 11.7 (*)    MCV 103.6 (*)    RDW 18.8 (*)    nRBC 0.3 (*)    All other components within normal limits  SARS CORONAVIRUS 2 (HOSPITAL ORDER, [facility])  LIPASE, BLOOD    EKG None  Radiology Ct Abdomen Pelvis Wo Contrast  Result Date: 07/17/2018 CLINICAL DATA:  Abdominal pain, gastroenteritis or colitis suspected EXAM: CT  ABDOMEN AND PELVIS WITHOUT CONTRAST TECHNIQUE: Multidetector CT imaging of the abdomen and pelvis was performed following the standard protocol without IV contrast. COMPARISON:  10/27/2017 FINDINGS: Lower chest: No acute abnormality. Cardiomegaly and extensive 3 vessel coronary artery calcifications and/or stents. Hepatobiliary: No focal liver abnormality is seen. Status post cholecystectomy. No biliary dilatation. Pancreas: Unremarkable. No pancreatic ductal dilatation or surrounding inflammatory changes. Spleen: Normal in size without significant abnormality. Adrenals/Urinary Tract: Adrenal glands are unremarkable. Atrophic kidneys. No renal calculi, solid lesion, or hydronephrosis. Bladder is unremarkable. Stomach/Bowel: Stomach is within normal limits. Appendix appears normal. The colon is mildly distended throughout, somewhat featureless, and fluid-filled to the rectum. Vascular/Lymphatic: Aortic atherosclerosis and vascular calcinosis. No enlarged abdominal or pelvic lymph nodes. Reproductive: No mass or other significant abnormality. Other: Fat containing midline ventral hernia unchanged from prior. No abdominopelvic ascites. Musculoskeletal: No acute or significant osseous findings. IMPRESSION: 1. The colon is mildly distended throughout, somewhat featureless, and fluid-filled to the rectum. Findings are suggestive of nonspecific infectious, inflammatory, or ischemic colitis and diarrheal illness. 2. Other chronic, incidental, and postoperative findings as detailed above. Electronically Signed   By: Eddie Candle M.D.   On: 07/17/2018 14:39    Procedures Procedures (including critical care time)  Medications Ordered in ED Medications  sodium chloride flush (NS) 0.9 % injection 3 mL (has no administration in time range)     Initial Impression / Assessment and Plan / ED Course  I have reviewed the triage vital signs and the nursing notes.  Pertinent labs & imaging results that were available  during my care of the patient were reviewed by me and considered in my medical decision making (see chart for details).        73 year old female with past medical history of CAD, GERD, CHF, ESRD on dialysis MWF, presents to ED for diarrhea.  She has had diarrhea since 07/12/2018.  She reports having 6 episodes of nonbloody diarrhea daily.  Feels similar to that she was diagnosed with C. difficile several years ago.  Denies any recent antibiotic use.  Started vomiting today.  Abdomen is generally tender without rebound or guarding.  She has noted that she missed her dialysis session on yesterday.  Reports creatinine of 10.5, anion gap of 19 consistent with missed dialysis.  She has leukocytosis of 24.  C. difficile is positive.  CT that shows findings consistent with inflammatory process.  Will admit for further management of C. difficile, fluid balance and dialysis.  Final Clinical Impressions(s) / ED Diagnoses   Final diagnoses:  C. difficile colitis  ESRD (end stage renal disease) on dialysis [facility])    ED Discharge Orders    None       Delia Heady, PA-C 07/17/18 [place], DO 07/17/18 1553

## 2018-07-17 NOTE — ED Triage Notes (Signed)
Pt. Stated, I started throwing up 3 days ago and then throwing up. Ive had C-Diff before.

## 2018-07-17 NOTE — ED Notes (Signed)
Flexi-seal placed for pt's comfort.  Pt has been cleaned and linins changed three times in past 90 minutes.  Skin breakdown noted, stage 2 wound on bottom.  Pt states feels better w/ tube in place.

## 2018-07-17 NOTE — ED Notes (Signed)
Provided update to patient's son and husband. Husband's phone number (c) [phone] (h) [phone]

## 2018-07-17 NOTE — Progress Notes (Addendum)
Pharmacy Antibiotic Note  Christine Santos is a 73 y.o. female admitted on 07/17/2018 with diarrhea 6x daily and hx of c difficile infection several years ago. Found to have c difficile infection with antigen and toxin positive. WBC 24, Tmax 98.3.  Pharmacy has been consulted for oral vancomycin dosing  Plan: Vancomycin PO 125mg  QID x10 days Monitor for resolution of infection  Height: 5\' 5"  (165.1 cm) Weight: 157 lb 13.6 oz (71.6 kg) IBW/kg (Calculated) : 57  Temp (24hrs), Avg:98.3 F (36.8 C), Min:98.3 F (36.8 C), Max:98.3 F (36.8 C)  Recent Labs  Lab 07/17/18 1241  WBC 24.7*  CREATININE 10.45*    Estimated Creatinine Clearance: 4.8 mL/min (A) (by C-G formula based on SCr of 10.45 mg/dL (H)).    Allergies  Allergen Reactions  . Cefepime Anaphylaxis    Throat swelling  . Amoxicillin Rash    Did it involve swelling of the face/tongue/throat, SOB, or low BP? No Did it involve sudden or severe rash/hives, skin peeling, or any reaction on the inside of your mouth or nose? No Did you need to seek medical attention at a hospital or doctor's office? No When did it last happen?10+ years If all above answers are "NO", may proceed with cephalosporin use.    . Codeine Phosphate Rash  . Penicillins Rash    Did it involve swelling of the face/tongue/throat, SOB, or low BP? No Did it involve sudden or severe rash/hives, skin peeling, or any reaction on the inside of your mouth or nose? No Did you need to seek medical attention at a hospital or doctor's office? No When did it last happen?10+ years If all above answers are "NO", may proceed with cephalosporin use.     Marland Kitchen Propoxyphene Rash    Antimicrobials this admission: PO Vanc 6/23 >> (7/3)  Thank you for involving pharmacy in this patient's care.  Janae Bridgeman, PharmD PGY1 Pharmacy Resident Phone: [phone] 07/17/2018 4:06 PM

## 2018-07-17 NOTE — Progress Notes (Signed)
Called for consult on patient w/ Cdif colitis, diarrhea and missed dialysis. Labs reviewed, stable, have written orders for HD in the morning.  Full note to follow tomorrow.   Kelly Splinter, MD 07/17/2018, 7:04 PM

## 2018-07-18 ENCOUNTER — Ambulatory Visit: Payer: Medicare Other | Admitting: Family Medicine

## 2018-07-18 ENCOUNTER — Encounter (HOSPITAL_COMMUNITY): Payer: Self-pay | Admitting: *Deleted

## 2018-07-18 DIAGNOSIS — K219 Gastro-esophageal reflux disease without esophagitis: Secondary | ICD-10-CM | POA: Diagnosis present

## 2018-07-18 DIAGNOSIS — R1084 Generalized abdominal pain: Secondary | ICD-10-CM | POA: Diagnosis not present

## 2018-07-18 DIAGNOSIS — A0471 Enterocolitis due to Clostridium difficile, recurrent: Secondary | ICD-10-CM | POA: Diagnosis not present

## 2018-07-18 DIAGNOSIS — B3781 Candidal esophagitis: Secondary | ICD-10-CM | POA: Diagnosis not present

## 2018-07-18 DIAGNOSIS — N2581 Secondary hyperparathyroidism of renal origin: Secondary | ICD-10-CM | POA: Diagnosis present

## 2018-07-18 DIAGNOSIS — N186 End stage renal disease: Secondary | ICD-10-CM

## 2018-07-18 DIAGNOSIS — F419 Anxiety disorder, unspecified: Secondary | ICD-10-CM | POA: Diagnosis present

## 2018-07-18 DIAGNOSIS — E118 Type 2 diabetes mellitus with unspecified complications: Secondary | ICD-10-CM

## 2018-07-18 DIAGNOSIS — D539 Nutritional anemia, unspecified: Secondary | ICD-10-CM | POA: Diagnosis present

## 2018-07-18 DIAGNOSIS — E11319 Type 2 diabetes mellitus with unspecified diabetic retinopathy without macular edema: Secondary | ICD-10-CM | POA: Diagnosis present

## 2018-07-18 DIAGNOSIS — Z96649 Presence of unspecified artificial hip joint: Secondary | ICD-10-CM | POA: Diagnosis present

## 2018-07-18 DIAGNOSIS — Z66 Do not resuscitate: Secondary | ICD-10-CM | POA: Diagnosis present

## 2018-07-18 DIAGNOSIS — I10 Essential (primary) hypertension: Secondary | ICD-10-CM | POA: Diagnosis not present

## 2018-07-18 DIAGNOSIS — I252 Old myocardial infarction: Secondary | ICD-10-CM | POA: Diagnosis not present

## 2018-07-18 DIAGNOSIS — I2510 Atherosclerotic heart disease of native coronary artery without angina pectoris: Secondary | ICD-10-CM | POA: Diagnosis present

## 2018-07-18 DIAGNOSIS — I480 Paroxysmal atrial fibrillation: Secondary | ICD-10-CM

## 2018-07-18 DIAGNOSIS — I5032 Chronic diastolic (congestive) heart failure: Secondary | ICD-10-CM | POA: Diagnosis present

## 2018-07-18 DIAGNOSIS — Z881 Allergy status to other antibiotic agents status: Secondary | ICD-10-CM | POA: Diagnosis not present

## 2018-07-18 DIAGNOSIS — Z885 Allergy status to narcotic agent status: Secondary | ICD-10-CM | POA: Diagnosis not present

## 2018-07-18 DIAGNOSIS — Z515 Encounter for palliative care: Secondary | ICD-10-CM | POA: Diagnosis not present

## 2018-07-18 DIAGNOSIS — J90 Pleural effusion, not elsewhere classified: Secondary | ICD-10-CM | POA: Diagnosis not present

## 2018-07-18 DIAGNOSIS — A0472 Enterocolitis due to Clostridium difficile, not specified as recurrent: Secondary | ICD-10-CM

## 2018-07-18 DIAGNOSIS — B370 Candidal stomatitis: Secondary | ICD-10-CM | POA: Diagnosis not present

## 2018-07-18 DIAGNOSIS — K429 Umbilical hernia without obstruction or gangrene: Secondary | ICD-10-CM | POA: Diagnosis not present

## 2018-07-18 DIAGNOSIS — I132 Hypertensive heart and chronic kidney disease with heart failure and with stage 5 chronic kidney disease, or end stage renal disease: Secondary | ICD-10-CM | POA: Diagnosis not present

## 2018-07-18 DIAGNOSIS — E1122 Type 2 diabetes mellitus with diabetic chronic kidney disease: Secondary | ICD-10-CM | POA: Diagnosis not present

## 2018-07-18 DIAGNOSIS — I120 Hypertensive chronic kidney disease with stage 5 chronic kidney disease or end stage renal disease: Secondary | ICD-10-CM | POA: Diagnosis not present

## 2018-07-18 DIAGNOSIS — Z794 Long term (current) use of insulin: Secondary | ICD-10-CM

## 2018-07-18 DIAGNOSIS — Z20828 Contact with and (suspected) exposure to other viral communicable diseases: Secondary | ICD-10-CM | POA: Diagnosis not present

## 2018-07-18 DIAGNOSIS — D696 Thrombocytopenia, unspecified: Secondary | ICD-10-CM | POA: Diagnosis present

## 2018-07-18 DIAGNOSIS — L8990 Pressure ulcer of unspecified site, unspecified stage: Secondary | ICD-10-CM

## 2018-07-18 DIAGNOSIS — Z7901 Long term (current) use of anticoagulants: Secondary | ICD-10-CM | POA: Diagnosis not present

## 2018-07-18 DIAGNOSIS — Z992 Dependence on renal dialysis: Secondary | ICD-10-CM | POA: Diagnosis not present

## 2018-07-18 DIAGNOSIS — E876 Hypokalemia: Secondary | ICD-10-CM | POA: Diagnosis present

## 2018-07-18 DIAGNOSIS — Z7189 Other specified counseling: Secondary | ICD-10-CM | POA: Diagnosis not present

## 2018-07-18 DIAGNOSIS — Z933 Colostomy status: Secondary | ICD-10-CM | POA: Diagnosis not present

## 2018-07-18 LAB — BASIC METABOLIC PANEL
Anion gap: 20 — ABNORMAL HIGH (ref 5–15)
BUN: 70 mg/dL — ABNORMAL HIGH (ref 8–23)
CO2: 24 mmol/L (ref 22–32)
Calcium: 7.7 mg/dL — ABNORMAL LOW (ref 8.9–10.3)
Chloride: 91 mmol/L — ABNORMAL LOW (ref 98–111)
Creatinine, Ser: 9.91 mg/dL — ABNORMAL HIGH (ref 0.44–1.00)
GFR calc Af Amer: 4 mL/min — ABNORMAL LOW (ref >60)
GFR calc non Af Amer: 3 mL/min — ABNORMAL LOW (ref >60)
Glucose, Bld: 174 mg/dL — ABNORMAL HIGH (ref 70–99)
Potassium: 4.3 mmol/L (ref 3.5–5.1)
Sodium: 135 mmol/L (ref 135–145)

## 2018-07-18 LAB — GLUCOSE, CAPILLARY
Glucose-Capillary: 158 mg/dL — ABNORMAL HIGH (ref 70–99)
Glucose-Capillary: 232 mg/dL — ABNORMAL HIGH (ref 70–99)
Glucose-Capillary: 139 mg/dL — ABNORMAL HIGH (ref 70–99)

## 2018-07-18 LAB — CBC
HCT: 36.2 % (ref 36.0–46.0)
Hemoglobin: 11.6 g/dL — ABNORMAL LOW (ref 12.0–15.0)
MCH: 32.1 pg (ref 26.0–34.0)
MCHC: 32 g/dL (ref 30.0–36.0)
MCV: 100.3 fL — ABNORMAL HIGH (ref 80.0–100.0)
Platelets: 202 10*3/uL (ref 150–400)
RBC: 3.61 MIL/uL — ABNORMAL LOW (ref 3.87–5.11)
RDW: 18.6 % — ABNORMAL HIGH (ref 11.5–15.5)
WBC: 24.9 10*3/uL — ABNORMAL HIGH (ref 4.0–10.5)
nRBC: 0.3 % — ABNORMAL HIGH (ref 0.0–0.2)

## 2018-07-18 LAB — VITAMIN B12: Vitamin B-12: 1691 pg/mL — ABNORMAL HIGH (ref 180–914)

## 2018-07-18 MED ORDER — TRAMADOL HCL 50 MG PO TABS
100.0000 mg | ORAL_TABLET | Freq: Two times a day (BID) | ORAL | Status: AC | PRN
  Administered 2018-07-18 – 2018-07-19 (×2): 100 mg via ORAL
  Filled 2018-07-18 (×3): qty 2

## 2018-07-18 MED ORDER — PRO-STAT SUGAR FREE PO LIQD
30.0000 mL | Freq: Two times a day (BID) | ORAL | Status: DC
  Administered 2018-07-18 – 2018-07-19 (×2): 30 mL via ORAL
  Filled 2018-07-18 (×2): qty 30

## 2018-07-18 MED ORDER — CALCITRIOL 0.25 MCG PO CAPS
ORAL_CAPSULE | ORAL | Status: AC
  Administered 2018-07-18: 0.25 ug via ORAL
  Filled 2018-07-18: qty 1

## 2018-07-18 MED ORDER — PROMETHAZINE HCL 25 MG/ML IJ SOLN
12.5000 mg | Freq: Once | INTRAMUSCULAR | Status: AC
  Administered 2018-07-18: 12.5 mg via INTRAVENOUS
  Filled 2018-07-18: qty 1

## 2018-07-18 MED ORDER — NEPRO/CARBSTEADY PO LIQD
237.0000 mL | Freq: Two times a day (BID) | ORAL | Status: DC
  Administered 2018-07-18 – 2018-07-22 (×6): 237 mL via ORAL

## 2018-07-18 NOTE — Progress Notes (Signed)
Renal Navigator notified OP HD clinic/East of patient's admission and negative COVID 19 rapid test result in order to provide continuity of care and safety.  Rilee Knoll Elizabeth, LCSW Renal Navigator [phone] 

## 2018-07-18 NOTE — Progress Notes (Signed)
Family Medicine Teaching Service Daily Progress Note Intern Pager: [phone]  Patient name: Christine Santos Medical record number: [id number] Date of birth: 10-28-1945 Age: 73 y.o. Gender: female  Primary Care Provider: [name], MD Consultants: Nephro Code Status: DNR  Pt Overview and Major Events to Date:  6/23-admission for C. difficile colitis  Assessment and Plan: Christine Santos is a 73 y.o. female presenting with 3 days of nausea, vomiting, diarrhea.  Previous medical history is significant for episodes of C. difficile colitis, ESRD, heart failure with preserved ejection fraction, CAD, diabetes, GERD.  C. difficile colitis Afebrile overnight with continued tachycardia up to 130.  Blood pressure stable systolics from [phone] diastolics 09-23.  No stools recorded overnight, RN reports several BMs overnight.  WBC remains elevated: 24.7>24.9(6/24). -Vancomycin p.o. 125 mg 4 times daily -Trend WBC -Monitor BMs -Continue enteric precautions  ESRD (MWF) Nephrology aware and planning for dialysis on 6/24.  Worsening uremia this morning (60>70), potassium stable at 4.3, creatinine remains elevated. -Nephrology aware -Dialysis MWF  Atrial fibrillation s/p ICD, chronic Continued tachycardia up to 130.  Likely poorly controlled in the setting of missing medication on 6/23 and ongoing infection. -Eliquis 5 mg daily -Metoprolol succinate 25 mg daily  Diabetes mellitus Blood glucose 166-210 in the past 24 hours.  No insulin administered since admission. -Continue insulin sliding scale -Consider beginning Lantus as she resumes her normal appetite  Heart failure with preserved ejection fraction, chronic Last echo (08/2015) shows EF of 55-60% with grade 1 diastolic dysfunction.   -Metoprolol as above  CAD  -Rosuvastatin 20 mg -Nitroglycerin SL as needed  GERD -Protonix 40 mg  Anxiety -Paxil 40 mg  Memory loss Noted per chart review. -Moca per SLP  Anemia,  macrocytic Hemoglobin 11.7 on admission.  MCV 103.  Most recent iron studies (10/2017 show iron 29, ferritin 278.  No B12 or folate collected for the past 2 years. -Follow-up B12/folate  FEN/GI: Renal diet PPx: Heparin SQ  Disposition: 1-2 days of hospitalization anticipated prior to discharge  Subjective:  No acute events overnight.  This morning she reports stomach pain and tenderness to palpation in all quadrants.  She also reports nausea through the night.  She is unsure if she had any bowel movements during the night.  She reported she had significant discomfort during dialysis this morning.  Objective: Temp:  [98 F (36.7 C)-98.3 F (36.8 C)] 98.2 F (36.8 C) (06/24 0532) Pulse Rate:  [63-126] 63 (06/24 0532) Resp:  [14-20] 16 (06/24 0532) BP: (102-151)/(45-95) 139/87 (06/24 0532) SpO2:  [85 %-100 %] 95 % (06/24 0532) Weight:  [69.3 kg-71.6 kg] 69.3 kg (06/23 2229) Physical Exam: General: Lying in bed at dialysis.  Demonstrated significant distress during a bout of stomach pain which did ultimately pass. Cardio: Normal S1 and S2, no S3 or S4. Rhythm is regular. No murmurs or rubs.   Pulm: Rales in lower fields bilaterally.  No wheezing/stridor noted on auscultation.  Breathing comfortably on room air Abdomen: Bowel sounds normal. Abdomen soft and non-tender.  Extremities: 2+ pitting edema up to the knees. Warm/ well perfused.  Strong radial. Neuro: Cranial nerves grossly intact   Laboratory: Recent Labs  Lab 07/17/18 1241 07/18/18 0305  WBC 24.7* 24.9*  HGB 11.7* 11.6*  HCT 37.5 36.2  PLT 218 202   Recent Labs  Lab 07/17/18 1241 07/18/18 0305  NA 135 135  K 4.8 4.3  CL 92* 91*  CO2 24 24  BUN 60* 70*  CREATININE 10.45*  9.91*  CALCIUM 8.6* 7.7*  PROT 5.0*  --   BILITOT 1.2  --   ALKPHOS 187*  --   ALT 22  --   AST 21  --   GLUCOSE 210* 174*    Imaging/Diagnostic Tests: Ct Abdomen Pelvis Wo Contrast  Result Date: 07/17/2018 CLINICAL DATA:  Abdominal  pain, gastroenteritis or colitis suspected EXAM: CT ABDOMEN AND PELVIS WITHOUT CONTRAST TECHNIQUE: Multidetector CT imaging of the abdomen and pelvis was performed following the standard protocol without IV contrast. COMPARISON:  10/27/2017 FINDINGS: Lower chest: No acute abnormality. Cardiomegaly and extensive 3 vessel coronary artery calcifications and/or stents. Hepatobiliary: No focal liver abnormality is seen. Status post cholecystectomy. No biliary dilatation. Pancreas: Unremarkable. No pancreatic ductal dilatation or surrounding inflammatory changes. Spleen: Normal in size without significant abnormality. Adrenals/Urinary Tract: Adrenal glands are unremarkable. Atrophic kidneys. No renal calculi, solid lesion, or hydronephrosis. Bladder is unremarkable. Stomach/Bowel: Stomach is within normal limits. Appendix appears normal. The colon is mildly distended throughout, somewhat featureless, and fluid-filled to the rectum. Vascular/Lymphatic: Aortic atherosclerosis and vascular calcinosis. No enlarged abdominal or pelvic lymph nodes. Reproductive: No mass or other significant abnormality. Other: Fat containing midline ventral hernia unchanged from prior. No abdominopelvic ascites. Musculoskeletal: No acute or significant osseous findings. IMPRESSION: 1. The colon is mildly distended throughout, somewhat featureless, and fluid-filled to the rectum. Findings are suggestive of nonspecific infectious, inflammatory, or ischemic colitis and diarrheal illness. 2. Other chronic, incidental, and postoperative findings as detailed above. Electronically Signed   By: Eddie Candle M.D.   On: 07/17/2018 14:39     Matilde Haymaker, MD 07/18/2018, 6:05 AM PGY-1, [place] Intern pager: [phone], text pages welcome

## 2018-07-18 NOTE — Telephone Encounter (Signed)
Now in hospital

## 2018-07-18 NOTE — Consult Note (Addendum)
[facility] Renal Consultation Note    Indication for Consultation:  Management of ESRD/hemodialysis; anemia, hypertension/volume and secondary hyperparathyroidism  HPI: Christine Santos is a 73 y.o. female with ESRD on HD, DM Type 2, HTN, dCHF CAD s/p CABG, PAFib on warfarin, Hx CVA.   Now admitted with C. Diff colitis. She has had previous episodes. Presented to ED with N/V/D x 3 days. CT abdomen suggestive of nonspecific infectious, inflammatory, or ischemic colitis and diarrheal illness. C Diff antigen/toxin positive. K 4.3, CO2 24, BUN 70, Cr 9.91, WBC 24.9. SARS-CoV-2 test negative. EKG showing Afib with RVR.   [place] MWF. Last dialysis Friday 6/19. Missed Monday d/t not feeling well. Due for dialysis today.   Seen in HD unit. BP 145/81 HR 96 on HD. Having a lot of abdominal discomfort. Nauseated, dry heaving, passing loose stools.    Past Medical History:  Diagnosis Date  . Anemia 08/29/2011  . Anxiety   . Arthritis    "legs" (01/22/2013)  . CAD (coronary artery disease)    s/p CABG in 1999  . Cataract   . Chronic kidney disease    Sees Dr [name]  . Chronic lymphocytic leukemia ([place]) 10/13/2006   Qualifier: Diagnosis of  By: Andria Frames MD, Gwyndolyn Saxon    . 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 Diastolic heart failure ([place])   . Dysrhythmia    PAF  . Enteritis due to Clostridium difficile 07/02/2017  . GERD (gastroesophageal reflux disease)   . Gout   . H/O hiatal hernia   . Headache    "years ago migraines, none in along time."  . Heart murmur   . Hyperlipidemia   . Hypertension   . IDDM (insulin dependent diabetes mellitus) ([place])    Type II  . Myocardial infarction ([place])    before 1995  . PAD (peripheral artery disease) ([place])   . Paroxysmal atrial fibrillation (H[facility])    a. identified on ILR as part of StrokeAF study  . Peripheral vascular disease ([place])   . Pneumonia 11/2016  . Stress incontinence,  female 11/11/2010  . 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 Still has balance issues 03/05/2018- vision improved, balance improved , back on walker since hospitaization,    Past Surgical History:  Procedure Laterality Date  . A/V FISTULAGRAM Left 12/25/2017   Procedure: A/V FISTULAGRAM;  Surgeon: Waynetta Sandy, MD;  Location: [place] CV LAB;  Service: Cardiovascular;  Laterality: Left;  . A/V SHUNT INTERVENTION Left 12/25/2017   Procedure: A/V SHUNT INTERVENTION;  Surgeon: Waynetta Sandy, MD;  Location: [place] CV LAB;  Service: Cardiovascular;  Laterality: Left;  . ANGIOPLASTY / STENTING FEMORAL Right 09/2010   SFA/notes 11/25/2010 (01/22/2013)  . ANGIOPLASTY / STENTING FEMORAL Left 11/2010   SFA/notes 11/25/2010 (01/22/2013)  . ANGIOPLASTY / STENTING ILIAC     [name] 11/25/2010 (01/22/2013)  . AV FISTULA PLACEMENT Left 03/07/2018   Procedure: CONVERSION TO  ARTERIOVENOUS Artegraft GRAFT ARM;  Surgeon: Waynetta Sandy, MD;  Location: [place];  Service: Vascular;  Laterality: Left;  . BASCILIC VEIN TRANSPOSITION Left 08/23/2017   Procedure: FIRST STAGE BASILIC VEIN TRANSPOSITION LEFT UPPER EXTREMITY;  Surgeon: [name], MD;  Location: [facility];  Service: Vascular;  Laterality: Left;  . BASCILIC VEIN TRANSPOSITION Left 10/23/2017   Procedure: SECOND STAGE BASILIC VEIN TRANSPOSITION LEFT ARM;  Surgeon: [name], MD;  Location: [place];  Service: Vascular;  Laterality: Left;  . CATARACT EXTRACTION W/ INTRAOCULAR LENS  IMPLANT, BILATERAL Bilateral ?2011  . CHOLECYSTECTOMY  1993  . CORONARY ARTERY BYPASS GRAFT  10/1994   "CABG X3"  . EP IMPLANTABLE DEVICE N/A 09/11/2015   Procedure: Loop Recorder Insertion;  Surgeon: Thompson Grayer, MD;  Location: [place] CV LAB;  Service: Cardiovascular;  Laterality: N/A;  . HEEL SPUR EXCISION Bilateral 1970's  . INTRAMEDULLARY (IM) NAIL INTERTROCHANTERIC Right 06/08/2018    Procedure: INTRAMEDULLARY (IM) NAIL INTERTROCHANTRIC;  Surgeon: Leandrew Koyanagi, MD;  Location: [place];  Service: Orthopedics;  Laterality: Right;  . IR FLUORO GUIDE CV LINE RIGHT  11/01/2017  . IR FLUORO GUIDE CV LINE RIGHT  11/05/2017  . IR US GUIDE VASC ACCESS RIGHT  11/01/2017  . IR US GUIDE VASC ACCESS RIGHT  11/05/2017  . LOWER EXTREMITY ANGIOGRAM N/A 01/22/2013   Procedure: LOWER EXTREMITY ANGIOGRAM;  Surgeon: [name], MD;  Location: [facility] CATH LAB;  Service: Cardiovascular;  Laterality: N/A;  . SHOULDER OPEN ROTATOR CUFF REPAIR Bilateral 1990's   "2 times on 1 side; once on the other"  . TRANSLUMINAL ATHERECTOMY FEMORAL ARTERY Right 01/22/2013   & balloon  . TUBAL LIGATION  1980's  . VIDEO ASSISTED THORACOSCOPY (VATS)/EMPYEMA Left 11/29/2016   Procedure: VIDEO ASSISTED THORACOSCOPY (VATS)/EMPYEMA;  Surgeon: [name], MD;  [facility] OR;  Service: Thoracic;  Laterality: Left;  VIDEO ASSISTED THORACOSCOPY (VATS)/EMPYEMA   Family History  Problem Relation Age of Onset  . Heart disease Mother   . Heart attack Mother   . Hypertension Mother   . Kidney disease Mother   . Pneumonia Father   . Heart attack Father   . Hyperlipidemia Father   . Diabetes Sister   . Breast cancer Sister   . Arthritis Brother   . Diabetes Brother   . Heart attack Sister   . Obesity Sister   . Hypertension Sister   . Heart attack Sister   . Cancer Sister        breast  . Hyperlipidemia Sister   . Hypertension Sister   . Hyperlipidemia Sister   . Breast cancer Maternal Grandmother    Social History:  reports that she has never smoked. She has never used smokeless tobacco. She reports that she does not drink alcohol or use drugs. Allergies  Allergen Reactions  . Cefepime Anaphylaxis    Throat swelling  . Amoxicillin Rash    Did it involve swelling of the face/tongue/throat, SOB, or low BP? No Did it involve sudden or severe rash/hives, skin peeling, or any reaction on the inside of  your mouth or nose? No Did you need to seek medical attention at a hospital or doctor's office? No When did it last happen?10+ years If all above answers are "NO", may proceed with cephalosporin use.    . Codeine Phosphate Rash  . Penicillins Rash    Did it involve swelling of the face/tongue/throat, SOB, or low BP? No Did it involve sudden or severe rash/hives, skin peeling, or any reaction on the inside of your mouth or nose? No Did you need to seek medical attention at a hospital or doctor's office? No When did it last happen?10+ years If all above answers are "NO", may proceed with cephalosporin use.     Marland Kitchen Propoxyphene Rash   Prior to Admission medications   Medication Sig Start Date End Date Taking? Authorizing Provider  ACCU-CHEK AVIVA PLUS test strip TEST THREE TIMES A DAY 04/26/17  [name], MD  acetaminophen (TYLENOL) 500 MG tablet Take 1,000 mg by mouth every 6 (six) hours as needed for moderate pain or headache.    [provider]  allopurinol (ZYLOPRIM) 100 MG tablet Take 0.5 tablets (50 mg total) by mouth daily. 07/11/18   [name], MD  apixaban (ELIQUIS) 5 MG TABS tablet Take 1 tablet (5 mg total) by mouth 2 (two) times daily. 07/11/18   [name], MD  Ascorbic Acid (VITAMIN C) 1000 MG tablet Take 1,000 mg by mouth daily.    [provider]  calcitRIOL (ROCALTROL) 0.25 MCG capsule Take 1 capsule (0.25 mcg total) by mouth every Monday, Wednesday, and Friday with hemodialysis. 07/11/18   [name], MD  carboxymethylcellulose (REFRESH PLUS) 0.5 % SOLN Place 1 drop into both eyes 3 (three) times daily.     [provider]  ferric citrate (AURYXIA) 1 GM 210 MG(Fe) tablet Take 2 tablets (420 mg total) by mouth 3 (three) times daily with meals. 06/14/18   Meccariello, Bernita Raisin, DO  GNP GARLIC EXTRACT PO Take 1 tablet by mouth daily.     [provider]  HUMALOG 100 UNIT/ML injection Inject 5 Units into  the skin 3 (three) times daily before meals. 07/02/18   [provider]  Insulin Glargine (LANTUS SOLOSTAR) 100 UNIT/ML Solostar Pen Inject 12 Units into the skin daily. 07/11/18   [name], MD  insulin lispro (HUMALOG KWIKPEN) 100 UNIT/ML KwikPen INJECT 5 UNITS UNDER THE SKIN THREE TIMES DAILY BEFORE MEALS 07/11/18   [name], MD  LANTUS 100 UNIT/ML injection Inject 5 Units into the skin at bedtime. 07/02/18   [provider]  lidocaine-prilocaine (EMLA) cream Apply 1 application topically See admin instructions. APPLY SMALL AMOUNT TO ACCESS SITE (AVF) 1 TO 2 HOURS BEFORE DIALYSIS. COVER WITH OCCLUSIVE DRESSING (SARAN WRAP) 06/18/18   [provider]  Melatonin 10 MG CAPS Take 10 mg by mouth at bedtime.     [provider]  metoprolol succinate (TOPROL-XL) 25 MG 24 hr tablet Take 1 tablet (25 mg total) by mouth daily. 07/11/18   [name], MD  Multiple Vitamins-Minerals (EYE VITAMINS PO) Take 1 capsule by mouth 2 (two) times daily. Taking one daily from her opthamologist (Dr. Herbert Deaner)     [provider]  Multiple Vitamins-Minerals (MULTIVITAMINS THER. W/MINERALS) TABS Take 1 tablet by mouth daily.      [provider]  nitroGLYCERIN (NITROSTAT) 0.4 MG SL tablet Place 1 tablet (0.4 mg total) under the tongue every 5 (five) minutes as needed for chest pain. 07/10/18   [name], MD  Omega-3 Fatty Acids (FISH OIL) 1000 MG CAPS Take 1,000 mg by mouth daily.    [provider]  pantoprazole (PROTONIX) 40 MG tablet Take 1 tablet (40 mg total) by mouth daily. 07/11/18   [name], MD  PARoxetine (PAXIL) 30 MG tablet Take 30 mg by mouth daily. 07/02/18   [provider]  PARoxetine (PAXIL) 40 MG tablet Take 1 tablet (40 mg total) by mouth every morning. 07/11/18   [name], MD  rosuvastatin (CRESTOR) 20 MG tablet TAKE 1 TABLET(20 MG) BY MOUTH DAILY 07/11/18   [name], MD   Current  Facility-Administered Medications  Medication Dose Route Frequency Provider Last Rate Last Dose  . acetaminophen (TYLENOL) tablet 650 mg  650 mg Oral Q6H PRN Matilde Haymaker, MD       Or  .  acetaminophen (TYLENOL) suppository 650 mg  650 mg Rectal Q6H PRN Matilde Haymaker, MD      . apixaban Arne [place]) tablet 5 mg  5 mg Oral BID Matilde Haymaker, MD   5 mg at 07/17/18 2347  . calcitRIOL (ROCALTROL) capsule 0.25 mcg  0.25 mcg Oral Q M,W,F-HD Matilde Haymaker, MD      . Chlorhexidine Gluconate Cloth 2 % PADS 6 each  6 each Topical Q0600 Roney Jaffe, MD   6 each at 07/18/18 [phone]  . feeding supplement (NEPRO CARB STEADY) liquid 237 mL  237 mL Oral BID BM Roney Jaffe, MD      . ferric citrate (AURYXIA) tablet 420 mg  420 mg Oral TID WC Matilde Haymaker, MD      . insulin aspart (novoLOG) injection 0-9 Units  0-9 Units Subcutaneous TID WC Matilde Haymaker, MD      . lidocaine-prilocaine (EMLA) cream 1 application  1 application Topical PRN Matilde Haymaker, MD      . Melatonin TABS 3 mg  3 mg Oral QHS Matilde Haymaker, MD   3 mg at 07/17/18 2346  . metoprolol succinate (TOPROL-XL) 24 hr tablet 25 mg  25 mg Oral Daily Matilde Haymaker, MD   25 mg at 07/17/18 2347  . multivitamin with minerals tablet 1 tablet  1 tablet Oral Daily Matilde Haymaker, MD      . nitroGLYCERIN (NITROSTAT) SL tablet 0.4 mg  0.4 mg Sublingual Q5 min PRN Matilde Haymaker, MD      . omega-3 acid ethyl esters (LOVAZA) capsule 1,000 mg  1,000 mg Oral Daily Matilde Haymaker, MD      . pantoprazole (PROTONIX) EC tablet 40 mg  40 mg Oral Daily Matilde Haymaker, MD      . PARoxetine (PAXIL) tablet 40 mg  40 mg Oral Q breakfast Matilde Haymaker, MD      . polyvinyl alcohol (LIQUIFILM TEARS) 1.4 % ophthalmic solution 1 drop  1 drop Both Eyes TID Matilde Haymaker, MD   1 drop at 07/17/18 2346  . promethazine (PHENERGAN) tablet 12.5 mg  12.5 mg Oral Q6H PRN Matilde Haymaker, MD      . rosuvastatin (CRESTOR) tablet 20 mg  20 mg Oral Daily Matilde Haymaker, MD      . sodium chloride flush (NS) 0.9 %  injection 3 mL  3 mL Intravenous Once Matilde Haymaker, MD      . vancomycin (VANCOCIN) 50 mg/mL oral solution 125 mg  125 mg Oral QID Matilde Haymaker, MD   125 mg at 07/18/18 0026  . vitamin C (ASCORBIC ACID) tablet 1,000 mg  1,000 mg Oral Daily Matilde Haymaker, MD         ROS: As per HPI otherwise negative.  Physical Exam: Vitals:   07/18/18 0532 07/18/18 0720 07/18/18 0730 07/18/18 0800  BP: 139/87 (!) 143/56 99/62 (!) 168/98  Pulse: 63 (!) 120 (!) 120 (!) 135  Resp: 16 12 13 13   Temp: 98.2 F (36.8 C) 98.6 F (37 C)    TempSrc: Oral Oral    SpO2: 95%     Weight:  69.3 kg    Height:         General: Ill appearing elderly female, appears uncomfortable, HAD  Head: NCAT sclera not icteric MMM Neck: Supple. No JVD  Lungs: CTA bilaterally without wheezes, rales, or rhonchi.  Heart: irregular  Abdomen: soft diffuse abdominal tenderness,  Lower extremities: 2+ pitting LE edema  Neuro: A & O  X 3. Moves all extremities spontaneously.  Psych:  Responds to questions appropriately with a normal affect. Dialysis Access: LUE AVG accessed on HD   Labs: Basic Metabolic Panel: Recent Labs  Lab 07/17/18 1241 07/18/18 0305  NA 135 135  K 4.8 4.3  CL 92* 91*  CO2 24 24  GLUCOSE 210* 174*  BUN 60* 70*  CREATININE 10.45* 9.91*  CALCIUM 8.6* 7.7*   Liver Function Tests: Recent Labs  Lab 07/17/18 1241  AST 21  ALT 22  ALKPHOS 187*  BILITOT 1.2  PROT 5.0*  ALBUMIN 2.5*   Recent Labs  Lab 07/17/18 1241  LIPASE 17   No results for input(s): AMMONIA in the last 168 hours. CBC: Recent Labs  Lab 07/17/18 1241 07/18/18 0305  WBC 24.7* 24.9*  HGB 11.7* 11.6*  HCT 37.5 36.2  MCV 103.6* 100.3*  PLT 218 202   Cardiac Enzymes: No results for input(s): CKTOTAL, CKMB, CKMBINDEX, TROPONINI in the last 168 hours. CBG: Recent Labs  Lab 07/17/18 2353  GLUCAP 166*   Iron Studies: No results for input(s): IRON, TIBC, TRANSFERRIN, FERRITIN in the last 72 hours. Studies/Results: Ct  Abdomen Pelvis Wo Contrast  Result Date: 07/17/2018 CLINICAL DATA:  Abdominal pain, gastroenteritis or colitis suspected EXAM: CT ABDOMEN AND PELVIS WITHOUT CONTRAST TECHNIQUE: Multidetector CT imaging of the abdomen and pelvis was performed following the standard protocol without IV contrast. COMPARISON:  10/27/2017 FINDINGS: Lower chest: No acute abnormality. Cardiomegaly and extensive 3 vessel coronary artery calcifications and/or stents. Hepatobiliary: No focal liver abnormality is seen. Status post cholecystectomy. No biliary dilatation. Pancreas: Unremarkable. No pancreatic ductal dilatation or surrounding inflammatory changes. Spleen: Normal in size without significant abnormality. Adrenals/Urinary Tract: Adrenal glands are unremarkable. Atrophic kidneys. No renal calculi, solid lesion, or hydronephrosis. Bladder is unremarkable. Stomach/Bowel: Stomach is within normal limits. Appendix appears normal. The colon is mildly distended throughout, somewhat featureless, and fluid-filled to the rectum. Vascular/Lymphatic: Aortic atherosclerosis and vascular calcinosis. No enlarged abdominal or pelvic lymph nodes. Reproductive: No mass or other significant abnormality. Other: Fat containing midline ventral hernia unchanged from prior. No abdominopelvic ascites. Musculoskeletal: No acute or significant osseous findings. IMPRESSION: 1. The colon is mildly distended throughout, somewhat featureless, and fluid-filled to the rectum. Findings are suggestive of nonspecific infectious, inflammatory, or ischemic colitis and diarrheal illness. 2. Other chronic, incidental, and postoperative findings as detailed above. Electronically Signed   By: Eddie Candle M.D.   On: 07/17/2018 14:39    Dialysis Orders:  [facility] MWF 4h BFR 350/600 EDW 69.5kg 2K/2Ca UFP 4  AVG Hep 2400 + 2000 mid run -Venofer 50 mg q week -Mircera 150 mcg IV q 2 weeks (last dose 6/17) -Calcitriol 0.25 TIW   Assessment/Plan: 1. C. Diff colitis - WBCs  ^. On PO Vanc -per primary   2. ESRD -  MWF. HD today on schedule  3. Hypertension/volume  - BP stable. Pre HD weight 69kg, does have some volume on exam. Challenge EDW slightly with HD today. Follow weights.  4. Anemia  - No ESA needs currently  5. Metabolic bone disease -  Continue VDRA/binders  6. Nutrition - Prot supp for low albumin  7. AFib - on metoprolol /Eliquis  8. Hx CVA 9. DM   Ogechi Larina Earthly [facility] Pager [phone] 07/18/2018, 8:24 AM   Pt seen, examined and agree w A/P as above.  Kelly Splinter  MD 07/18/2018, 3:17 PM

## 2018-07-18 NOTE — Consult Note (Addendum)
[place] Nurse wound consult note Patient receiving care in [facility] [phone]. Reason for Consult: pressure ulcers  Wound type: DTPI to coccyx, unstageable PI to left hip Pressure Injury POA: Yes Measurement: left hip is 100% yellow/brown slough and measures 1.6 cm x 2.8 cm. Surrounding tissue has erythema.  The coccyx is dark purple, without blood under skin, and measures 3.5 cm x 4.2 cm.  Surrounding tissue is intact. Both wounds have foam dressings over them. Dressing procedure/placement/frequency: Cleanse wound on left hip with soap and water. Pat dry.  Apply a hydrocolloid dressing Kellie Simmering # [id number]).  Change daily. This dressing will promote autolytic debridement of the slough. Continue the use of sacral foam dressing.  The coccyx wound may very well evolve into an unstageable wound that will require different therapy.  I have ordered a low air loss mattress.  Turning instructions have been entered. Monitor the wound area(s) for worsening of condition such as: Signs/symptoms of infection,  Increase in size,  Development of or worsening of odor, Development of pain, or increased pain at the affected locations.  Notify the medical team if any of these develop.  Thank you for the consult.  Discussed plan of care with the patient and bedside nurse.  [place] nurse will not follow at this time.  Please re-consult the [place] team if needed.  Val Riles, RN, MSN, CWOCN, CNS-BC, pager [phone]

## 2018-07-18 NOTE — Progress Notes (Signed)
PT Cancellation Note  Patient Details Name: Christine Santos MRN: [id number] DOB: 27-Apr-1945   Cancelled Treatment:      Patient initially agreeable to eval. Once she started moving towards the edge of the bed though she reported too much pain in er heels and wounds on her buttock. Therapy advised her that the less she moves the more the wounds are going to develop. She has recently returned from dialysis and requested to rest today but reports she will work with therapy tomorrow.    Carney Living PT DPT  07/18/2018, 3:54 PM

## 2018-07-18 NOTE — Plan of Care (Signed)

## 2018-07-18 NOTE — Progress Notes (Addendum)
Attempted second time. Pt back from HD but reports not feeling well enough to get OOB. Plan to reattempt tomorrw.      07/18/18 0800  OT Visit Information  Last OT Received On 07/18/18  Reason Eval/Treat Not Completed Patient at procedure or test/ unavailable. HD.    Plan to reattempt.  Tyrone Schimke, OT Acute Rehabilitation Services Pager: [phone] Office: [phone]

## 2018-07-18 NOTE — Progress Notes (Signed)
SLP Cancellation Note  Patient Details Name: Christine Santos MRN: [id number] DOB: Nov 30, 1945   Cancelled treatment:       Reason Eval/Treat Not Completed: Patient at procedure or test/unavailable(Pt currently at HD. SLP will follow up. )  [name] Sallade I. Hardin Negus, [place], [place] Office number [phone] Pager [place] 07/18/2018, 10:09 AM

## 2018-07-18 NOTE — Progress Notes (Signed)
Social visit by PCP I am sadden to see how much weaker Christine Santos is than just a few weeks ago.  With her heavy burden of chronic medical problems she has been increasingly frail over the past year.  The decline is now quite steep.  I hope that she can regain some strength as we again treat her C diff.  She is also understandably depressed with her significant decline in health.

## 2018-07-19 ENCOUNTER — Ambulatory Visit: Payer: Medicare Other | Admitting: Orthopaedic Surgery

## 2018-07-19 ENCOUNTER — Telehealth: Payer: Self-pay

## 2018-07-19 DIAGNOSIS — C9110 Chronic lymphocytic leukemia of B-cell type not having achieved remission: Secondary | ICD-10-CM

## 2018-07-19 DIAGNOSIS — E1122 Type 2 diabetes mellitus with diabetic chronic kidney disease: Secondary | ICD-10-CM

## 2018-07-19 DIAGNOSIS — K580 Irritable bowel syndrome with diarrhea: Secondary | ICD-10-CM

## 2018-07-19 DIAGNOSIS — A0471 Enterocolitis due to Clostridium difficile, recurrent: Principal | ICD-10-CM

## 2018-07-19 DIAGNOSIS — I2510 Atherosclerotic heart disease of native coronary artery without angina pectoris: Secondary | ICD-10-CM

## 2018-07-19 DIAGNOSIS — E1151 Type 2 diabetes mellitus with diabetic peripheral angiopathy without gangrene: Secondary | ICD-10-CM

## 2018-07-19 DIAGNOSIS — K429 Umbilical hernia without obstruction or gangrene: Secondary | ICD-10-CM

## 2018-07-19 DIAGNOSIS — I132 Hypertensive heart and chronic kidney disease with heart failure and with stage 5 chronic kidney disease, or end stage renal disease: Secondary | ICD-10-CM

## 2018-07-19 DIAGNOSIS — Z933 Colostomy status: Secondary | ICD-10-CM

## 2018-07-19 DIAGNOSIS — Z880 Allergy status to penicillin: Secondary | ICD-10-CM

## 2018-07-19 DIAGNOSIS — Z881 Allergy status to other antibiotic agents status: Secondary | ICD-10-CM

## 2018-07-19 DIAGNOSIS — Z8673 Personal history of transient ischemic attack (TIA), and cerebral infarction without residual deficits: Secondary | ICD-10-CM

## 2018-07-19 DIAGNOSIS — I5030 Unspecified diastolic (congestive) heart failure: Secondary | ICD-10-CM

## 2018-07-19 DIAGNOSIS — Z885 Allergy status to narcotic agent status: Secondary | ICD-10-CM

## 2018-07-19 LAB — CBC
HCT: 37.3 % (ref 36.0–46.0)
Hemoglobin: 11.6 g/dL — ABNORMAL LOW (ref 12.0–15.0)
MCH: 31.5 pg (ref 26.0–34.0)
MCHC: 31.1 g/dL (ref 30.0–36.0)
MCV: 101.4 fL — ABNORMAL HIGH (ref 80.0–100.0)
Platelets: 186 10*3/uL (ref 150–400)
RBC: 3.68 MIL/uL — ABNORMAL LOW (ref 3.87–5.11)
RDW: 19 % — ABNORMAL HIGH (ref 11.5–15.5)
WBC: 25.8 10*3/uL — ABNORMAL HIGH (ref 4.0–10.5)
nRBC: 0.3 % — ABNORMAL HIGH (ref 0.0–0.2)

## 2018-07-19 LAB — BASIC METABOLIC PANEL
Anion gap: 19 — ABNORMAL HIGH (ref 5–15)
BUN: 41 mg/dL — ABNORMAL HIGH (ref 8–23)
CO2: 24 mmol/L (ref 22–32)
Calcium: 7.9 mg/dL — ABNORMAL LOW (ref 8.9–10.3)
Chloride: 94 mmol/L — ABNORMAL LOW (ref 98–111)
Creatinine, Ser: 6.54 mg/dL — ABNORMAL HIGH (ref 0.44–1.00)
GFR calc Af Amer: 7 mL/min — ABNORMAL LOW (ref >60)
GFR calc non Af Amer: 6 mL/min — ABNORMAL LOW (ref >60)
Glucose, Bld: 160 mg/dL — ABNORMAL HIGH (ref 70–99)
Potassium: 3.8 mmol/L (ref 3.5–5.1)
Sodium: 137 mmol/L (ref 135–145)

## 2018-07-19 LAB — GLUCOSE, CAPILLARY
Glucose-Capillary: 158 mg/dL — ABNORMAL HIGH (ref 70–99)
Glucose-Capillary: 186 mg/dL — ABNORMAL HIGH (ref 70–99)
Glucose-Capillary: 161 mg/dL — ABNORMAL HIGH (ref 70–99)
Glucose-Capillary: 166 mg/dL — ABNORMAL HIGH (ref 70–99)

## 2018-07-19 LAB — FOLATE RBC
Folate, Hemolysate: >620 ng/mL
Folate, RBC: >1727 ng/mL (ref >498)
Hematocrit: 35.9 % (ref 34.0–46.6)

## 2018-07-19 MED ORDER — PROMETHAZINE HCL 25 MG/ML IJ SOLN
12.5000 mg | Freq: Four times a day (QID) | INTRAMUSCULAR | Status: DC | PRN
  Administered 2018-07-19 (×2): 12.5 mg via INTRAVENOUS
  Filled 2018-07-19 (×2): qty 1

## 2018-07-19 MED ORDER — METOPROLOL SUCCINATE ER 50 MG PO TB24
50.0000 mg | ORAL_TABLET | Freq: Every day | ORAL | Status: DC
  Administered 2018-07-20 – 2018-07-22 (×3): 50 mg via ORAL
  Filled 2018-07-19 (×3): qty 1

## 2018-07-19 MED ORDER — DIFICID 200 MG PO TABS: 200.0000 mg | ORAL_TABLET | Freq: Two times a day (BID) | ORAL | 0 refills | Status: DC

## 2018-07-19 MED ORDER — PRO-STAT SUGAR FREE PO LIQD
30.0000 mL | Freq: Three times a day (TID) | ORAL | Status: DC
  Administered 2018-07-19 – 2018-07-22 (×4): 30 mL via ORAL
  Filled 2018-07-19 (×7): qty 30

## 2018-07-19 MED ORDER — RENA-VITE PO TABS
1.0000 | ORAL_TABLET | Freq: Every day | ORAL | Status: DC
  Administered 2018-07-19 – 2018-07-21 (×3): 1 via ORAL
  Filled 2018-07-19 (×3): qty 1

## 2018-07-19 NOTE — Plan of Care (Signed)

## 2018-07-19 NOTE — Progress Notes (Addendum)
[facility] Progress Note   Subjective:  Continued abd pain/nausea  Objective Vitals:   07/18/18 1030 07/18/18 1149 07/18/18 2115 07/19/18 0510  BP: (!) 148/55 135/75 140/64 129/65  Pulse: (!) 121 (!) 115 92 (!) 114  Resp: 12 20 18 16   Temp: 98.7 F (37.1 C) 99 F (37.2 C) 98.1 F (36.7 C) 98.1 F (36.7 C)  TempSrc: Oral Oral Oral Oral  SpO2:  91% 95% 95%  Weight:      Height:        Physical Exam General: ill appearing elderly female, nad  Heart: irregular  Lungs: CTAB Abdomen: soft, diffuse tenderness to palpation  Extremities: +pitting LE edema  Dialysis Access: LUE AVG +bruit    Weight change: -2.3 kg   Additional Objective Labs: Basic Metabolic Panel: Recent Labs  Lab 07/17/18 1241 07/18/18 0305 07/19/18 0228  NA 135 135 137  K 4.8 4.3 3.8  CL 92* 91* 94*  CO2 24 24 24   GLUCOSE 210* 174* 160*  BUN 60* 70* 41*  CREATININE 10.45* 9.91* 6.54*  CALCIUM 8.6* 7.7* 7.9*   CBC: Recent Labs  Lab 07/17/18 1241 07/18/18 0305 07/19/18 0228  WBC 24.7* 24.9* 25.8*  HGB 11.7* 11.6* 11.6*  HCT 37.5 36.2 37.3  MCV 103.6* 100.3* 101.4*  PLT 218 202 186   Blood Culture    Component Value Date/Time   SDES BLOOD RIGHT ANTECUBITAL 10/29/2017 1125   SDES BLOOD RIGHT ANTECUBITAL 10/29/2017 1125   SPECREQUEST  10/29/2017 1125    BOTTLES DRAWN AEROBIC AND ANAEROBIC Blood Culture adequate volume   SPECREQUEST  10/29/2017 1125    BOTTLES DRAWN AEROBIC AND ANAEROBIC Blood Culture adequate volume   CULT  10/29/2017 1125    NO GROWTH 5 DAYS Performed at [facility], [place] [address], [place], [place] [address]    CULT  10/29/2017 1125    NO GROWTH 5 DAYS Performed at [facility], [place] [address] [place], [place] [address]    REPTSTATUS 11/03/2017 FINAL 10/29/2017 1125   REPTSTATUS 11/03/2017 FINAL 10/29/2017 1125     Medications:  . apixaban  5 mg Oral BID  . calcitRIOL  0.25 mcg Oral Q M,W,F-HD  . Chlorhexidine Gluconate  Cloth  6 each Topical Q0600  . feeding supplement (NEPRO CARB STEADY)  237 mL Oral BID BM  . feeding supplement (PRO-STAT SUGAR FREE 64)  30 mL Oral BID  . ferric citrate  420 mg Oral TID WC  . insulin aspart  0-9 Units Subcutaneous TID WC  . Melatonin  3 mg Oral QHS  . metoprolol succinate  25 mg Oral Daily  . multivitamin with minerals  1 tablet Oral Daily  . omega-3 acid ethyl esters  1,000 mg Oral Daily  . pantoprazole  40 mg Oral Daily  . PARoxetine  40 mg Oral Q breakfast  . polyvinyl alcohol  1 drop Both Eyes TID  . rosuvastatin  20 mg Oral Daily  . sodium chloride flush  3 mL Intravenous Once  . vancomycin  125 mg Oral QID  . vitamin C  1,000 mg Oral Daily    Dialysis Orders:  [facility] MWF   4h  350/600  69.5kg  2/2 bath  P4  AVG  Hep 2400 +2063midrun -Venofer 50 mg q week -Mircera 150 mcg IV q 2 weeks (last dose 6/17) -Calcitriol 0.25 TIW   Assessment/Plan: 1. C. Diff colitis - WBCs ^. Flexi-Seal placed. On PO Vanc -per primary   2. ESRD -  MWF. Next HD 6/26. Use 4K bath  3. Hypertension/volume  - BP stable. IIs at EDW. Net UF 641mL with HD yesterday. Does have some volume on exam. Minimal challenge to EDW as tolerated. Follow weights.  4. Anemia  - No ESA needs currently  5. Metabolic bone disease -  Continue VDRA/binders  6. Nutrition - Prot supp for low albumin  7. AFib s/p ICD  - on metoprolol /Eliquis  8. Hx CVA 9. DM   Ogechi Larina Earthly PA-C [facility] Pager [phone] 07/19/2018,9:51 AM  LOS: 1 day   Pt seen, examined and agree w A/P as above.  Kelly Splinter  MD 07/19/2018, 3:27 PM

## 2018-07-19 NOTE — Telephone Encounter (Signed)
Called and discussed.  We both agree that it is no longer safe for her to live independantly (with her husband).  Options will be SNF versus home hospice.  Will get inpatient team to consult palliative care.  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

## 2018-07-19 NOTE — Telephone Encounter (Signed)
Pt sister [name] is calling to ask Dr. Andria Frames what is going on with [name]. [name] doesn't understand what they are telling her. Please give her a call at [phone]. Ottis Stain, CMA

## 2018-07-19 NOTE — Progress Notes (Signed)
Occupational Therapy Evaluation Patient Details Name: Christine Santos MRN: [id number] DOB: [birth year] Today's Date: 07/19/2018    History of Present Illness Christine Santos is a 73 y.o. female presenting with 3 days of nausea, vomiting, diarrhea.  Previous medical history is significant for episodes of C. difficile colitis, ESRD, heart failure with preserved ejection fraction, CAD, diabetes, GERD.   Clinical Impression   PTA, pt was living at home with her friend, Abe People, pt reports having assistance with LB dressing, reports her sister assisted with IADL (shopping and home management) and pt reports she was modified independent with functional mobility with use of AD. Pt currently requires minA+2 for functional mobility at RW level, modA for LB dressing, and setupA for eating. Due to decline in current level of function, pt would benefit from acute OT to address established goals to facilitate safe D/C to venue listed below. At this time, recommend SNF follow-up. Will continue to follow acutely.     Follow Up Recommendations  SNF;Supervision/Assistance - 24 hour    Equipment Recommendations  3 in 1 bedside commode;Hospital bed    Recommendations for Other Services       Precautions / Restrictions Precautions Precautions: Fall Precaution Comments: flexiseal Restrictions Weight Bearing Restrictions: No      Mobility Bed Mobility Overal bed mobility: Needs Assistance Bed Mobility: Rolling;Sidelying to Sit Rolling: Min guard Sidelying to sit: Min assist;HOB elevated       General bed mobility comments: pt progressed legs to EOB minguard, required elevated HOB and minA to progress to upright   Transfers Overall transfer level: Needs assistance Equipment used: Rolling walker (2 wheeled);2 person hand held assist Transfers: Sit to/from [facility] Sit to Stand: Min assist;+2 physical assistance;+2 safety/equipment Stand pivot transfers: Min assist;+2 physical  assistance;+2 safety/equipment       General transfer comment: minA+2 to powerup into standing;reports of dizziness and nausea requiring return to sitting EOB after 1st attempt;stood 2nd attempt minA+2 to powerup and stand-pivot from EOB to recliner with use of RW    Balance Overall balance assessment: Needs assistance Sitting-balance support: Single extremity supported;Feet supported Sitting balance-Leahy Scale: Fair Sitting balance - Comments: able to tolerate sitting EOB for 77min;reports of dizziness subsiding after 77min   Standing balance support: Bilateral upper extremity supported Standing balance-Leahy Scale: Poor Standing balance comment: requires BUE support on RW;reports of dizziness and nausea with initial stand, required return to sitting EOB, subsided after 2 min                           ADL either performed or assessed with clinical judgement   ADL Overall ADL's : Needs assistance/impaired Eating/Feeding: Set up;Sitting Eating/Feeding Details (indicate cue type and reason): requested assistance cutting food Grooming: Set up;Sitting   Upper Body Bathing: Min guard;Sitting   Lower Body Bathing: Moderate assistance;Sit to/from stand   Upper Body Dressing : Min guard;Sitting   Lower Body Dressing: Moderate assistance;Sit to/from stand Lower Body Dressing Details (indicate cue type and reason): required assistance to don/doff socks Toilet Transfer: Minimal assistance;+2 for physical assistance;+2 for safety/equipment;Stand-pivot Toilet Transfer Details (indicate cue type and reason): simulated transfer from EOB to Bertie and Hygiene: Minimal assistance;+2 for physical assistance;+2 for safety/equipment Toileting - Clothing Manipulation Details (indicate cue type and reason): pt requires UE support on walker;currently has flexiseal in place     Functional mobility during ADLs: Minimal assistance;+2 for physical  assistance;+2 for safety/equipment;Rolling walker  Vision Baseline Vision/History: Wears glasses       Perception     Praxis      Pertinent Vitals/Pain Pain Assessment: Faces Faces Pain Scale: Hurts little more Pain Location: "tailbone" Pain Descriptors / Indicators: Aching;Constant Pain Intervention(s): Limited activity within patient's tolerance;Monitored during session     Hand Dominance Right   Extremity/Trunk Assessment Upper Extremity Assessment Upper Extremity Assessment: Generalized weakness   Lower Extremity Assessment Lower Extremity Assessment: Generalized weakness   Cervical / Trunk Assessment Cervical / Trunk Assessment: Normal   Communication Communication Communication: No difficulties   Cognition Arousal/Alertness: Awake/alert Behavior During Therapy: WFL for tasks assessed/performed Overall Cognitive Status: Within Functional Limits for tasks assessed                                 General Comments: pt oriented x4, demonstrated good safety awareness stating she does not feel safe returning home at this level of functioning;   General Comments  HR 90s-108bpm throughout session;doE 3/4 with functional mobility     Exercises     Shoulder Instructions      Home Living Family/patient expects to be discharged to:: Private residence Living Arrangements: Non-relatives/Friends Available Help at Discharge: Friend(s);Available 24 hours/day Type of Home: House Home Access: Stairs to enter [employer] of Steps: 2 Entrance Stairs-Rails: Left Home Layout: One level     Bathroom Shower/Tub: Tub/shower unit;Curtain   Bathroom Toilet: Handicapped height     Home Equipment: Environmental consultant - 2 wheels;Cane - quad;Cane - single point;Shower seat;Grab bars - Dispensing optician - 4 wheels;Bedside commode;Adaptive equipment Adaptive Equipment: Reacher;Sock aid Additional Comments: Lives with her friend Abe People who is  visually impaired.      Prior Functioning/Environment Level of Independence: Independent with assistive device(s)        Comments: uses cane for ambulation;reports last driving was in january;reports having medical transportation for HD;sister does grocery shopping for her;sister aslo assists with laundry and home management         OT Problem List: Decreased strength;Decreased range of motion;Impaired balance (sitting and/or standing);Decreased activity tolerance;Decreased safety awareness;Decreased knowledge of use of DME or AE;Decreased knowledge of precautions;Pain      OT Treatment/Interventions: Self-care/ADL training;Therapeutic exercise;Energy conservation;DME and/or AE instruction;Therapeutic activities;Patient/family education;Balance training    OT Goals(Current goals can be found in the care plan section) Acute Rehab OT Goals Patient Stated Goal: to get stronger OT Goal Formulation: With patient Time For Goal Achievement: 08/02/18 Potential to Achieve Goals: Good  OT Frequency: Min 2X/week   Barriers to D/C: Decreased caregiver support          Co-evaluation PT/OT/SLP Co-Evaluation/Treatment: Yes Reason for Co-Treatment: For patient/therapist safety;To address functional/ADL transfers   OT goals addressed during session: ADL's and self-care      AM-PAC OT "6 Clicks" Daily Activity     Outcome Measure Help from another person eating meals?: A Little Help from another person taking care of personal grooming?: A Little Help from another person toileting, which includes using toliet, bedpan, or urinal?: A Little Help from another person bathing (including washing, rinsing, drying)?: A Little Help from another person to put on and taking off regular upper body clothing?: A Little Help from another person to put on and taking off regular lower body clothing?: A Lot 6 Click Score: 17   End of Session Equipment Utilized During Treatment: Gait belt;Rolling  walker Nurse Communication: Mobility status  Activity Tolerance: Patient tolerated treatment  well;Patient limited by fatigue Patient left: in chair;with call bell/phone within reach;with chair alarm set  OT Visit Diagnosis: Unsteadiness on feet (R26.81);Other abnormalities of gait and mobility (R26.89);Muscle weakness (generalized) (M62.81);History of falling (Z91.81);Pain Pain - part of body: ("tailbone")                Time: 9672-2773 OT Time Calculation (min): 39 min Charges:  OT General Charges $OT Visit: 1 Visit OT Evaluation $OT Eval Moderate Complexity: 1 Mod OT Treatments $Self Care/Home Management : 8-22 mins  Dorinda Hill OTR/L Acute Rehabilitation Services Office: [place] 07/19/2018, 10:23 AM

## 2018-07-19 NOTE — Evaluation (Addendum)
Speech Language Pathology Evaluation Patient Details Name: Christine Santos MRN: [id number] DOB: 05/04/1945 Today's Date: 07/19/2018 Time: 0865-7846 SLP Time Calculation (min) (ACUTE ONLY): 31 min  Problem List:  Patient Active Problem List   Diagnosis Date Noted  . Pressure injury of skin 07/18/2018  . Clostridium difficile colitis 07/17/2018  . Polypharmacy 07/11/2018  . Memory loss 07/11/2018  . Pain in left hand 06/27/2018  . Ileus ([place])   . Closed fracture of neck of right femur ([place])   . Displaced intertrochanteric fracture of right femur, initial encounter for closed fracture ([place]) 06/07/2018  . Controlled type 2 diabetes mellitus with complication, with long-term current use of insulin ([place])   . High risk social situation 02/05/2018  . Pain in right hip 02/16/2017  . History of stroke 01/20/2017  . Physical deconditioning 12/30/2016  . Transaminitis 12/23/2016  . Paroxysmal atrial fibrillation ([place]) 10/22/2015  . Laryngopharyngeal reflux (LPR) 09/14/2015  . Diabetes mellitus type 2 with neurological manifestations ([place]) 02/26/2014  . Diabetic nephropathy ([place]) 05/23/2013  . Diabetic neuropathy ([place]) 11/30/2012  . Macular degeneration 07/04/2012  . Diabetic retinopathy ([place]) 03/20/2012  . Osteopenia 09/22/2011  . Anemia in chronic kidney disease 08/29/2011  . Gout 04/13/2011  . Anxiety 08/18/2010  . Chronic systolic heart failure ([place]) 02/18/2009  . SINUS BRADYCARDIA 10/29/2008  . Peripheral vascular disease ([place]) 06/18/2008  . ESRD (end stage renal disease) on dialysis ([place]) 07/13/2007  . 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 HYPERCHOLESTEROLEMIA 03/23/2006  . Essential hypertension 03/23/2006  . Coronary atherosclerosis 03/23/2006  . Reflux esophagitis 03/23/2006  . CYSTOCELE/RECTOCELE/PROLAPSE,UNSPEC. 03/23/2006  . Osteoarthritis, multiple sites 03/23/2006   Past Medical History:  Past Medical History:  Diagnosis Date  .  Anemia 08/29/2011  . Anxiety   . Arthritis    "legs" (01/22/2013)  . CAD (coronary artery disease)    s/p CABG in 1999  . Cataract   . Chronic kidney disease    Sees Dr [name]  . Chronic lymphocytic leukemia ([place]) 10/13/2006   Qualifier: Diagnosis of  By: Andria Frames MD, Gwyndolyn Saxon    . 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 Diastolic heart failure ([place])   . Dysrhythmia    PAF  . Enteritis due to Clostridium difficile 07/02/2017  . ESRD on dialysis ([place])   . GERD (gastroesophageal reflux disease)   . Gout   . H/O hiatal hernia   . Headache    "years ago migraines, none in along time."  . Heart murmur   . Hyperlipidemia   . Hypertension   . IDDM (insulin dependent diabetes mellitus) ([place])    Type II  . Myocardial infarction ([place])    before 1995  . PAD (peripheral artery disease) ([place])   . Paroxysmal atrial fibrillation ([facility])    a. identified on ILR as part of StrokeAF study  . Peripheral vascular disease ([place])   . Pneumonia 11/2016  . Stress incontinence, female 11/11/2010  . Stroke (cerebrum) ([place]) 08/2015   "seeing double for 2 weeks" vision normal now. Still has balance issues 03/05/2018- vision improved, balance improved , back on walker since hospitaization,    Past Surgical History:  Past Surgical History:  Procedure Laterality Date  . A/V FISTULAGRAM Left 12/25/2017   Procedure: A/V FISTULAGRAM;  Surgeon: Waynetta Sandy, MD;  Location: [facility];  Service: Cardiovascular;  Laterality: Left;  . A/V SHUNT INTERVENTION Left 12/25/2017   Procedure: A/V SHUNT INTERVENTION;  Surgeon: Donzetta Matters,  Georgia Dom, MD;  Location: [facility];  Service: Cardiovascular;  Laterality: Left;  . ANGIOPLASTY / STENTING FEMORAL Right 09/2010   SFA/notes 11/25/2010 (01/22/2013)  . ANGIOPLASTY / STENTING FEMORAL Left 11/2010   SFA/notes 11/25/2010 (01/22/2013)  . ANGIOPLASTY / STENTING ILIAC     [name] 11/25/2010 (01/22/2013)  . AV FISTULA  PLACEMENT Left 03/07/2018   Procedure: CONVERSION TO  ARTERIOVENOUS Artegraft GRAFT ARM;  Surgeon: Waynetta Sandy, MD;  Location: [facility];  Service: Vascular;  Laterality: Left;  . BASCILIC VEIN TRANSPOSITION Left 08/23/2017   Procedure: FIRST STAGE BASILIC VEIN TRANSPOSITION LEFT UPPER EXTREMITY;  Surgeon: [name], MD;  Location: [facility];  Service: Vascular;  Laterality: Left;  . BASCILIC VEIN TRANSPOSITION Left 10/23/2017   Procedure: SECOND STAGE BASILIC VEIN TRANSPOSITION LEFT ARM;  Surgeon: [name], MD;  Location: [facility];  Service: Vascular;  Laterality: Left;  . CATARACT EXTRACTION W/ INTRAOCULAR LENS  IMPLANT, BILATERAL Bilateral ?2011  . CHOLECYSTECTOMY  1993  . CORONARY ARTERY BYPASS GRAFT  10/1994   "CABG X3"  . EP IMPLANTABLE DEVICE N/A 09/11/2015   Procedure: Loop Recorder Insertion;  Surgeon: Thompson Grayer, MD;  Location: [facility];  Service: Cardiovascular;  Laterality: N/A;  . HEEL SPUR EXCISION Bilateral 1970's  . INTRAMEDULLARY (IM) NAIL INTERTROCHANTERIC Right 06/08/2018   Procedure: INTRAMEDULLARY (IM) NAIL INTERTROCHANTRIC;  Surgeon: Leandrew Koyanagi, MD;  Location: [facility];  Service: Orthopedics;  Laterality: Right;  . IR FLUORO GUIDE CV LINE RIGHT  11/01/2017  . IR FLUORO GUIDE CV LINE RIGHT  11/05/2017  . IR US GUIDE VASC ACCESS RIGHT  11/01/2017  . IR US GUIDE VASC ACCESS RIGHT  11/05/2017  . LOWER EXTREMITY ANGIOGRAM N/A 01/22/2013   Procedure: LOWER EXTREMITY ANGIOGRAM;  Surgeon: [name], MD;  Location[facility];  Service: Cardiovascular;  Laterality: N/A;  . SHOULDER OPEN ROTATOR CUFF REPAIR Bilateral 1990's   "2 times on 1 side; once on the other"  . TRANSLUMINAL ATHERECTOMY FEMORAL ARTERY Right 01/22/2013   & balloon  . TUBAL LIGATION  1980's  . VIDEO ASSISTED THORACOSCOPY (VATS)/EMPYEMA Left 11/29/2016   Procedure: VIDEO ASSISTED THORACOSCOPY (VATS)/EMPYEMA;  Surgeon: Prescott Gum, Collier Salina, MD;  Location: [facility];  Service:  Thoracic;  Laterality: Left;  VIDEO ASSISTED THORACOSCOPY (VATS)/EMPYEMA   HPI:  Pt is a 73 y.o. female presenting with 3 days of nausea, vomiting, diarrhea. Previous medical history is significant for episodes of C. difficile colitis, ESRD, heart failure with preserved ejection fraction, CAD, diabetes, GERD. Per PCP's note, pt's sister has noticed a decline in the pt's cognition during the past year.    Assessment / Plan / Recommendation Clinical Impression  Pt reported that she was living with a friend prior to admission. She denied any baseline deficits in speech or language but she stated that she has been having difficulty with memory prior to admission which is currently at baseline. She was unsure as to when she began to notice deficits in cognition but expressed that it "wasn't quite this bad" until she fell in March. She stated that she did not go to the hospital after that fall and "may have" mentioned her fall and increased difficulty to her PCP. She also reported "confusion" when attempting to sort her pills in her pill box. Per the pt, she often placed the wrong medications in the incorrect sections and had to repeatedly start over. She stated that her sister now supervises her during this task since  the friend who she lives with is blind and therefore cannot assist.   The [facility] Cognitive Assessment 8.1 was completed to evaluate the pt's cognitive-linguistic skills. She achieved a score of 12/30 which is below the normal limits of 26 or more out of 30 and is suggestive of a moderate impairment. She demonstrated deficits in the areas of executive function, attention, mental manipulation, divergent naming, abstract reasoning, memory, and orientation. Acute skilled SLP services are not clinically indicated at this time since, per the pt's report and her medical record, this appears to be her baseline level of cognitive functioning. However, 24-hour supervision is recommendend considering her  current deficits. The recommendation for further skilled SLP services following discharge will be deferred until the etiology of pt's cognitive changes is established. Pt, the referring MD, and nursing were educated regarding results and recommendations. Pt and nursing verbalized understanding as well as agreement with plan of care.    SLP Assessment  SLP Recommendation/Assessment: Patient does not need any further Speech Lanaguage Pathology Services SLP Visit Diagnosis: Cognitive communication deficit (R41.841)    Follow Up Recommendations  24 hour supervision/assistance(Recommendation for further SLP services deferred until etiology is established)    Frequency and Duration           SLP Evaluation Cognition  Overall Cognitive Status: History of cognitive impairments - at baseline Arousal/Alertness: Awake/alert Orientation Level: Oriented to person;Oriented to situation;Disoriented to time;Disoriented to place(Oriented to month, year, day, city  not date or location) Attention: Focused;Sustained Focused Attention: Impaired Focused Attention Impairment: Verbal complex(Vigilance impaired: 0/1) Sustained Attention: Impaired Sustained Attention Impairment: Verbal complex(Serial 7s: 0/3) Memory: Impaired Memory Impairment: Retrieval deficit;Decreased recall of new information;Storage deficit(Immediate:3/5; delayed: 0/5; with cues: 4/5) Awareness: Appears intact Problem Solving: Impaired Problem Solving Impairment: Verbal complex Executive Function: Reasoning;Sequencing;Organizing Reasoning: Impaired Reasoning Impairment: Verbal complex(Abstraction: 1/2) Sequencing: Impaired Sequencing Impairment: Verbal complex(Clock drawing: 0/1 (numbers went up to 7 and hands error)) Organizing: Impaired Organizing Impairment: Verbal complex(Backward digit span: 0/1)       Comprehension  Auditory Comprehension Overall Auditory Comprehension: Appears within functional limits for tasks  assessed Yes/No Questions: Within Functional Limits Commands: Impaired Complex Commands: (Difficulty following complex task instructions- trail: 0/1) Conversation: Complex Visual Recognition/Discrimination Discrimination: Within Function Limits Reading Comprehension Reading Status: Not tested    Expression Expression Primary Mode of Expression: Verbal Verbal Expression Overall Verbal Expression: Appears within functional limits for tasks assessed Initiation: No impairment Level of Generative/Spontaneous Verbalization: Conversation Repetition: Impaired Level of Impairment: Sentence level(1/2) Naming: Impairment Responsive: Not tested Confrontation: (2/3) Convergent: Not tested Divergent: (0/1; pt did not provide any words with "f") Pragmatics: Impairment Impairments: Abnormal affect(flat affect) Interfering Components: Attention Written Expression Dominant Hand: Right Written Expression: (Difficulty copying cube: 0/1)   Oral / Motor  Oral Motor/Sensory Function Overall Oral Motor/Sensory Function: Within functional limits Motor Speech Overall Motor Speech: Appears within functional limits for tasks assessed Respiration: Within functional limits Phonation: Normal Resonance: Within functional limits Articulation: Within functional limitis Intelligibility: Intelligible Motor Planning: Witnin functional limits Motor Speech Errors: Not applicable   Rickey Sadowski I. Hardin Negus, [place], [place] Office number [phone] Pager [place] 07/19/2018, 1:57 PM

## 2018-07-19 NOTE — Evaluation (Signed)
Physical Therapy Evaluation Patient Details Name: Christine Santos MRN: [id number] DOB: Jan 19, 1946 Today's Date: 07/19/2018   History of Present Illness  Christine Santos is a 73 y.o. female presenting with 3 days of nausea, vomiting, diarrhea.  Previous medical history is significant for episodes of C. difficile colitis, ESRD, heart failure with preserved ejection fraction, CAD, diabetes, GERD.    Clinical Impression  Patient admitted with the above listed diagnosis. Patient reports requiring some assist for ADLs and mobility prior to admission. Patient today requiring increased level of assist for bed level mobility and tranfers to recliner. Patient with general weakness limiting mobility with further mobility deferred for patient/therapist safety. PT to recommend SNF at discharge. PT to follow acutely.     Follow Up Recommendations SNF;Supervision/Assistance - 24 hour    Equipment Recommendations  Other (comment)(TBD)    Recommendations for Other Services       Precautions / Restrictions Precautions Precautions: Fall Precaution Comments: flexiseal Restrictions Weight Bearing Restrictions: No      Mobility  Bed Mobility Overal bed mobility: Needs Assistance Bed Mobility: Rolling;Sidelying to Sit Rolling: Min guard Sidelying to sit: Min assist;HOB elevated       General bed mobility comments: pt progressed legs to EOB minguard, required elevated HOB and minA to progress to upright   Transfers Overall transfer level: Needs assistance Equipment used: Rolling walker (2 wheeled);2 person hand held assist Transfers: Sit to/from [facility] Sit to Stand: Min assist;+2 physical assistance;+2 safety/equipment Stand pivot transfers: Min assist;+2 physical assistance;+2 safety/equipment       General transfer comment: minA+2 to powerup into standing;reports of dizziness and nausea requiring return to sitting EOB after 1st attempt;stood 2nd attempt minA+2 to powerup  and stand-pivot from EOB to recliner with use of RW  Ambulation/Gait             General Gait Details: only shuffle steps to recliner  Stairs            Wheelchair Mobility    Modified Rankin (Stroke Patients Only)       Balance Overall balance assessment: Needs assistance Sitting-balance support: Single extremity supported;Feet supported Sitting balance-Leahy Scale: Fair Sitting balance - Comments: able to tolerate sitting EOB for 33min;reports of dizziness subsiding after 70min   Standing balance support: Bilateral upper extremity supported Standing balance-Leahy Scale: Poor Standing balance comment: requires BUE support on RW;reports of dizziness and nausea with initial stand, required return to sitting EOB, subsided after 2 min                             Pertinent Vitals/Pain Pain Assessment: Faces Faces Pain Scale: Hurts little more Pain Location: "tailbone" Pain Descriptors / Indicators: Aching;Constant Pain Intervention(s): Limited activity within patient's tolerance;Monitored during session;Repositioned    Home Living Family/patient expects to be discharged to:: Private residence Living Arrangements: Non-relatives/Friends Available Help at Discharge: Friend(s);Available 24 hours/day Type of Home: House Home Access: Stairs to enter Entrance Stairs-Rails: Left Entrance Stairs-Number of Steps: 2 Home Layout: One level Home Equipment: Walker - 2 wheels;Cane - quad;Cane - single point;Shower seat;Grab bars - Dispensing optician - 4 wheels;Bedside commode;Adaptive equipment Additional Comments: Lives with her friend Abe People who is visually impaired.    Prior Function Level of Independence: Independent with assistive device(s)         Comments: uses cane for ambulation;reports last driving was in january;reports having medical transportation for HD;sister does grocery shopping for her;sister aslo assists with  laundry and home  management      Hand Dominance   Dominant Hand: Right    Extremity/Trunk Assessment   Upper Extremity Assessment Upper Extremity Assessment: Generalized weakness    Lower Extremity Assessment Lower Extremity Assessment: Generalized weakness    Cervical / Trunk Assessment Cervical / Trunk Assessment: Normal  Communication   Communication: No difficulties  Cognition Arousal/Alertness: Awake/alert Behavior During Therapy: WFL for tasks assessed/performed Overall Cognitive Status: Within Functional Limits for tasks assessed                                 General Comments: pt oriented x4, demonstrated good safety awareness stating she does not feel safe returning home at this level of functioning;      General Comments General comments (skin integrity, edema, etc.): HR 90s-108bpm throughout session;doE 3/4 with functional mobility    Exercises     Assessment/Plan    PT Assessment Patient needs continued PT services  PT Problem List Decreased strength;Decreased activity tolerance;Decreased balance;Decreased mobility;Decreased knowledge of use of DME;Decreased safety awareness       PT Treatment Interventions DME instruction;Gait training;Functional mobility training;Therapeutic activities;Therapeutic exercise;Balance training;Patient/family education    PT Goals (Current goals can be found in the Care Plan section)  Acute Rehab PT Goals Patient Stated Goal: to get stronger PT Goal Formulation: With patient Time For Goal Achievement: 08/02/18 Potential to Achieve Goals: Fair    Frequency Min 2X/week   Barriers to discharge        Co-evaluation PT/OT/SLP Co-Evaluation/Treatment: Yes Reason for Co-Treatment: For patient/therapist safety;To address functional/ADL transfers PT goals addressed during session: Mobility/safety with mobility;Balance;Proper use of DME         AM-PAC PT "6 Clicks" Mobility  Outcome Measure Help needed turning from your  back to your side while in a flat bed without using bedrails?: A Little Help needed moving from lying on your back to sitting on the side of a flat bed without using bedrails?: A Little Help needed moving to and from a bed to a chair (including a wheelchair)?: A Lot Help needed standing up from a chair using your arms (e.g., wheelchair or bedside chair)?: A Lot Help needed to walk in hospital room?: Total Help needed climbing 3-5 steps with a railing? : Total 6 Click Score: 12    End of Session Equipment Utilized During Treatment: Gait belt;Oxygen Activity Tolerance: Patient tolerated treatment well Patient left: in chair;with call bell/phone within reach;with chair alarm set Nurse Communication: Mobility status PT Visit Diagnosis: Unsteadiness on feet (R26.81);Other abnormalities of gait and mobility (R26.89);Muscle weakness (generalized) (M62.81)    Time: 5956-3875 PT Time Calculation (min) (ACUTE ONLY): 39 min   Charges:   PT Evaluation $PT Eval Moderate Complexity: 1 Mod           Lanney Gins, PT, DPT Supplemental Physical Therapist 07/19/18 2:22 PM Pager: [phone] Office: [phone]

## 2018-07-19 NOTE — Progress Notes (Signed)
Initial Nutrition Assessment  RD working remotely.  DOCUMENTATION CODES:   Not applicable  INTERVENTION:   -Continue 1000 mg vitamin C daily -Renal MVI daily -Continue Nepro Shake po BID, each supplement provides 425 kcal and 19 grams protein -Increase 30 ml Prostat to TID, each supplement provides 100 kcals and 15 grams protein  NUTRITION DIAGNOSIS:   Increased nutrient needs related to wound healing as evidenced by estimated needs.  GOAL:   Patient will meet greater than or equal to 90% of their needs  MONITOR:   PO intake, Supplement acceptance, Diet advancement, Labs, Weight trends, Skin, I & O's  REASON FOR ASSESSMENT:   Malnutrition Screening Tool    ASSESSMENT:   Christine Santos is a 73 y.o. female presenting with 3 days of nausea, vomiting, diarrhea.  Previous medical history is significant for episodes of C. difficile colitis, ESRD, heart failure with preserved ejection fraction  Pt admitted C-diff.   Reviewed I/O's: -603 ml x 24 hours  Per nephrology notes, dry weight 69.5 kg.   Reviewed CWOCN note on 07/18/18, pt with DPTI to coccyx and unstageable pressure injury to lt hip.   Spoke with pt over the phone, who reports a decreased appetite over the past few days since being admitted to this hospital. Per her report, she ate breakfast, but it was only a few bites of eggs and sausage, admitting that her nausea and diarrhea are exacerbating her poor appetite. PTA, pt reports having a good appetite and was focusing on increasing protein sources. She consumed 3 meals per day (Breakfast: boiled egg, fruit, and coffee; Lunch: protein and a bowl of greens; Dinner: same as lunch or a subway meat sandwich).   Pt denies any weight loss and reports her UBW is around 150#. Per wt hx, wt has been stable over the past month.  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 Per her report, she acquired these wounds a year ago and they resolved, however, recently returned over  the past few weeks. She has a home health nurse who assists with dressing changes.   Discussed with pt importance of good meal and supplement intake, for both HD and wound healing. Pt is amenable to continue Prostat and Nepro supplements.   Lab Results  Component Value Date   HGBA1C 6.4 04/09/2018   PTA DM medications are 12 units insulin glargine daily and 5 units insulin aspart TID with meals.   Labs reviewed: CBGS: 098-119 (inpatient orders for glycemic control are 0-9 units insulin aspart TID with meals).   NUTRITION - FOCUSED PHYSICAL EXAM:    Most Recent Value  Orbital Region  Unable to assess  Upper Arm Region  Unable to assess  Thoracic and Lumbar Region  Unable to assess  Buccal Region  Unable to assess  Temple Region  Unable to assess  Clavicle Bone Region  Unable to assess  Clavicle and Acromion Bone Region  Unable to assess  Scapular Bone Region  Unable to assess  Dorsal Hand  Unable to assess  Patellar Region  Unable to assess  Anterior Thigh Region  Unable to assess  Posterior Calf Region  Unable to assess  Edema (RD Assessment)  Unable to assess  Hair  Unable to assess  Eyes  Unable to assess  Mouth  Unable to assess  Skin  Unable to assess  Nails  Unable to assess       Diet Order:   Diet Order  Diet renal/carb modified with fluid restriction Diet-HS Snack? Nothing; Fluid restriction: 1200 mL Fluid; Room service appropriate? Yes; Fluid consistency: Thin  Diet effective now              EDUCATION NEEDS:   Education needs have been addressed  Skin:  Skin Assessment: Skin Integrity Issues: Skin Integrity Issues:: DTI, Unstageable DTI: coccyx Unstageable: lt hip  Last BM:  07/18/18  Height:   Ht Readings from Last 1 Encounters:  07/17/18 5\' 5"  (1.651 m)    Weight:   Wt Readings from Last 1 Encounters:  07/18/18 69.3 kg    Ideal Body Weight:  56.8 kg  BMI:  Body mass index is 25.42 kg/m.  Estimated Nutritional Needs:    Kcal:  1800-2000  Protein:  100-115 grams  Fluid:  1000 ml + UOP    Christine Santos A. Jimmye Norman, RD, LDN, [place] Registered Dietitian II Certified Diabetes Care and Education Specialist Pager: [phone] After hours Pager: [phone]

## 2018-07-19 NOTE — Progress Notes (Addendum)
Family Medicine Teaching Service Daily Progress Note Intern Pager: [phone]  Patient name: Christine Santos Medical record number: [id number] Date of birth: 16-May-1945 Age: 73 y.o. Gender: female  Primary Care Provider: [name], MD Consultants: Nephro Code Status: DNR  Pt Overview and Major Events to Date:  6/23-admission for C. difficile colitis  Assessment and Plan: Christine Santos is a 73 y.o. female presenting with 3 days of nausea, vomiting, diarrhea.  Previous medical history is significant for episodes of C. difficile colitis, ESRD, heart failure with preserved ejection fraction, CAD, diabetes, GERD.  C. difficile colitis Afebrile overnight with vitals remarkable for continued mild tachycardia up to 122.  WBC remains elevated: 24.7>24.9(6/24)>25.8(6/25).   -Consult ID regarding possible fecal microbiome transplant -Vancomycin p.o. 125 mg 4 times daily -Trend WBC -Monitor BMs -Flexi-Seal placed -Continue enteric precautions  ESRD (MWF) Dialysis session 6/24.  Electrolytes vastly improved. -Nephrology aware -Dialysis MWF  Atrial fibrillation s/p ICD, chronic Mild improvement in tachycardia compared to admission.  Tachycardia up to 122 although primarily below 110 in the past 24 hours. -Eliquis 5 mg daily -Metoprolol succinate 25 mg daily  Diabetes mellitus Blood sugar 139-232 in the past 24 hours 5 units aspart administered in the past 24 hours. -Continue insulin sliding scale -Consider beginning Lantus as she resumes her normal appetite  Heart failure with preserved ejection fraction, chronic Last echo (08/2015) shows EF of 55-60% with grade 1 diastolic dysfunction.   -Metoprolol as above  CAD  -Rosuvastatin 20 mg -Nitroglycerin SL as needed  GERD -Protonix 40 mg  Anxiety -Paxil 40 mg  Memory loss Noted per chart review. -Moca per SLP  Anemia, macrocytic Hemoglobin 11.7 on admission.  MCV 103.  Most recent iron studies (10/2017 show iron 29,  ferritin 278.  No B12 or folate collected for the past 2 years. -Follow-up B12/folate  FEN/GI: Renal diet PPx: Heparin SQ  Disposition: 2-4 days of hospitalization anticipated prior to discharge  Subjective:  No acute events overnight.  No new complaints this morning.  She continues to mention abdominal pain and tenderness to palpation.  She does have a mild nausea.  Objective: Temp:  [98.1 F (36.7 C)-99 F (37.2 C)] 98.1 F (36.7 C) (06/25 0510) Pulse Rate:  [92-135] 114 (06/25 0510) Resp:  [12-20] 16 (06/25 0510) BP: (99-168)/(46-98) 129/65 (06/25 0510) SpO2:  [91 %-95 %] 95 % (06/25 0510) Weight:  [69.3 kg] 69.3 kg (06/24 0720) Physical Exam: General: Alert and cooperative and appears to be in no acute distress.  Weak, ill-appearing this morning. HEENT: Neck non-tender without lymphadenopathy, masses or thyromegaly Cardio: Normal S1 and S2, no S3 or S4. Rhythm is regular. No murmurs or rubs.   Pulm: Clear to auscultation bilaterally, no crackles, wheezing, or diminished breath sounds. Normal respiratory effort Abdomen: Bowel sounds normal. Abdomen soft and non-tender.  Extremities: Improved LE edema, trace edema this morning.  Warm, well perfused.  Strong radial pulse. Neuro: Cranial nerves grossly intact    Laboratory: Recent Labs  Lab 07/17/18 1241 07/18/18 0305 07/19/18 0228  WBC 24.7* 24.9* 25.8*  HGB 11.7* 11.6* 11.6*  HCT 37.5 36.2 37.3  PLT 218 202 186   Recent Labs  Lab 07/17/18 1241 07/18/18 0305 07/19/18 0228  NA 135 135 137  K 4.8 4.3 3.8  CL 92* 91* 94*  CO2 24 24 24   BUN 60* 70* 41*  CREATININE 10.45* 9.91* 6.54*  CALCIUM 8.6* 7.7* 7.9*  PROT 5.0*  --   --   BILITOT 1.2  --   --  ALKPHOS 187*  --   --   ALT 22  --   --   AST 21  --   --   GLUCOSE 210* 174* 160*    Imaging/Diagnostic Tests: No results found.   Matilde Haymaker, MD 07/19/2018, 6:35 AM PGY-1, [place] Intern pager: [phone], text pages welcome

## 2018-07-19 NOTE — Progress Notes (Signed)
Patient;s sister called and was updated.  She then called into the room and spoke with patient.  Patient is in bed resting.  Will continue to monitor.

## 2018-07-19 NOTE — Consult Note (Signed)
Christine Santos for Infectious Disease    Date of Admission:  07/17/2018     Total days of antibiotics 3               Reason for Consult: Clostridium Difficile Colitis   Referring Provider: Hensel Primary Care Provider: [name], MD   Assessment/Plan:  Christine Santos is a 73 y/o female with multiple medical problems including history of C. Difficile colitis last seen in July 2019 for recurrent C. Difficile colitis admitted with diarrhea and vomiting with positive C. Difficile toxin and PCR testing. Started on oral vancomycin.   Recurrent C. Difficile Colitis - Currently on 125 mg of vancomycin 4 times per day (see below). Given that this is likely a third recurrence will require vancomycin taper. Previously discussed possibility of FMT if recurrence were to happen again and will need to complete therapy before that is an option.  125 mg orally4 times daily for 10days, then 125 mg orally twice daily for 7 days, then 125 mg orally once daily for 7 days, then 125 mg orally every 2 or 3 days for 6 weeks  ESRD on Dialysis - Continue hemodialysis per nephrology.   Paroxysmal Atrial Fibrillation - Currently stable with no signs of exacerbation.   Principal Problem:   Clostridium difficile colitis Active Problems:   Multiple complications of type II diabetes mellitus ([place])   Essential hypertension   ESRD (end stage renal disease) on dialysis (HCC)   Paroxysmal atrial fibrillation (HCC)   Controlled type 2 diabetes mellitus with complication, with long-term current use of insulin (HCC)   Pressure injury of skin   . apixaban  5 mg Oral BID  . calcitRIOL  0.25 mcg Oral Q M,W,F-HD  . Chlorhexidine Gluconate Cloth  6 each Topical Q0600  . feeding supplement (NEPRO CARB STEADY)  237 mL Oral BID BM  . feeding supplement (PRO-STAT SUGAR FREE 64)  30 mL Oral TID WC  . ferric citrate  420 mg Oral TID WC  . insulin aspart  0-9 Units Subcutaneous TID WC  . Melatonin  3 mg Oral  QHS  . [START ON 07/20/2018] metoprolol succinate  50 mg Oral Daily  . multivitamin  1 tablet Oral QHS  . omega-3 acid ethyl esters  1,000 mg Oral Daily  . pantoprazole  40 mg Oral Daily  . PARoxetine  40 mg Oral Q breakfast  . polyvinyl alcohol  1 drop Both Eyes TID  . rosuvastatin  20 mg Oral Daily  . sodium chloride flush  3 mL Intravenous Once  . vancomycin  125 mg Oral QID  . vitamin C  1,000 mg Oral Daily     HPI: Christine Santos is a 73 y.o. female with previous medical history multiple medical problems including CVA in 2017, peripheral vascular disease, paroxysmal atrial fibrillation, peripheral artery disease, hypertension, end-stage renal disease on dialysis, diastolic heart failure, chronic lymphoblastic leukemia in 2015, chronic lymphocytic leukemia in 2008, coronary artery disease and IBS with diarrhea and C. Difficile infection admitted to the hospital with 5-day history of vomiting and diarrhea.  Having 5-6 episodes of nonbloody diarrhea daily.  She had no recent antibiotic use, fever, cough, or shortness of breath.  Afebrile and tachycardic in the ED.  Creatinine of 10.45 consistent with ESRD, white blood cell count of 24.7 and stool testing positive for C. difficile antigen, toxin, and PCR.  CT scan of the abdomen and pelvis with: Being mildly distended throughout and fluid-filled to  the rectum suggestive of nonspecific infectious, inflammatory, or ischemic colitis and diarrheal illness.  Christine Santos  is known to the ID service having previously seen Dr. Baxter Flattery in July 2019 with recurrent C. Difficile after initial treatment of 10 days of vancomycin and then a tapered dose of vancomycin. Discussed at the time possibly FMT if symptoms were to recur. Last seen in December 2019 with no signs of recurrence.   Christine Santos has been afebrile since admission with leukocytosis between 24.9-25.8. She is currently on Day 3 of oral vancomycin at 125 mg qid.  Christine Santos indicates her symptoms started  about 4 to 5 days ago with new onset diarrhea having completed a course of amoxicillin for urinary tract infection a couple weeks prior to symptom onset.  She does not provide the most accurate of history is and has trouble recalling previous events.  Her baseline bowel movements per day is 1-2 which this is increased from previous.   Review of Systems: Review of Systems  Constitutional: Negative for chills, fever and weight loss.  Respiratory: Negative for cough, shortness of breath and wheezing.   Cardiovascular: Negative for chest pain and leg swelling.  Gastrointestinal: Positive for diarrhea. Negative for abdominal pain, constipation, nausea and vomiting.  Skin: Negative for rash.     Past Medical History:  Diagnosis Date  . Anemia 08/29/2011  . Anxiety   . Arthritis    "legs" (01/22/2013)  . CAD (coronary artery disease)    s/p CABG in 1999  . Cataract   . Chronic kidney disease    Sees Dr [name]  . Chronic lymphocytic leukemia ([place]) 10/13/2006   Qualifier: Diagnosis of  By: Andria Frames MD, Gwyndolyn Saxon    . 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 Diastolic heart failure ([place])   . Dysrhythmia    PAF  . Enteritis due to Clostridium difficile 07/02/2017  . ESRD on dialysis ([place])   . GERD (gastroesophageal reflux disease)   . Gout   . H/O hiatal hernia   . Headache    "years ago migraines, none in along time."  . Heart murmur   . Hyperlipidemia   . Hypertension   . IDDM (insulin dependent diabetes mellitus) ([place])    Type II  . Myocardial infarction ([place])    before 1995  . PAD (peripheral artery disease) ([place])   . Paroxysmal atrial fibrillation (HCC)    a. identified on ILR as part of StrokeAF study  . Peripheral vascular disease ([place])   . Pneumonia 11/2016  . Stress incontinence, female 11/11/2010  . Stroke (cerebrum) ([place]) 08/2015   "seeing double for 2 weeks" vision normal now. Still has balance issues 03/05/2018- vision improved, balance improved ,  back on walker since hospitaization,     Social History   Tobacco Use  . Smoking status: Never Smoker  . Smokeless tobacco: Never Used  Substance Use Topics  . Alcohol use: No  . Drug use: No    Family History  Problem Relation Age of Onset  . Heart disease Mother   . Heart attack Mother   . Hypertension Mother   . Kidney disease Mother   . Pneumonia Father   . Heart attack Father   . Hyperlipidemia Father   . Diabetes Sister   . Breast cancer Sister   . Arthritis Brother   . Diabetes Brother   . Heart attack Sister   . Obesity Sister   . Hypertension Sister   . Heart  attack Sister   . Cancer Sister        breast  . Hyperlipidemia Sister   . Hypertension Sister   . Hyperlipidemia Sister   . Breast cancer Maternal Grandmother     Allergies  Allergen Reactions  . Cefepime Anaphylaxis    Throat swelling  . Amoxicillin Rash    Did it involve swelling of the face/tongue/throat, SOB, or low BP? No Did it involve sudden or severe rash/hives, skin peeling, or any reaction on the inside of your mouth or nose? No Did you need to seek medical attention at a hospital or doctor's office? No When did it last happen?10+ years If all above answers are "NO", may proceed with cephalosporin use.    . Codeine Phosphate Rash  . Penicillins Rash    Did it involve swelling of the face/tongue/throat, SOB, or low BP? No Did it involve sudden or severe rash/hives, skin peeling, or any reaction on the inside of your mouth or nose? No Did you need to seek medical attention at a hospital or doctor's office? No When did it last happen?10+ years If all above answers are "NO", may proceed with cephalosporin use.     Marland Kitchen Propoxyphene Rash    OBJECTIVE: Blood pressure 92/67, pulse (!) 107, temperature 98.1 F (36.7 C), temperature source Oral, resp. rate 16, height 5\' 5"  (1.651 m), weight 69.3 kg, SpO2 93 %.  Physical Exam Constitutional:      General: She is not in acute  distress.    Appearance: She is well-developed.     Comments: Lying in bed with head of bed elevated; pleasant  Cardiovascular:     Rate and Rhythm: Normal rate and regular rhythm.     Heart sounds: Normal heart sounds.  Pulmonary:     Effort: Pulmonary effort is normal.     Breath sounds: Normal breath sounds.  Abdominal:     Comments: Rectal pouch present with liquid stool.  Skin:    General: Skin is warm and dry.  Neurological:     Mental Status: She is alert and oriented to person, place, and time.  Psychiatric:        Behavior: Behavior normal.        Thought Content: Thought content normal.        Judgment: Judgment normal.     Lab Results Lab Results  Component Value Date   WBC 25.8 (H) 07/19/2018   HGB 11.6 (L) 07/19/2018   HCT 37.3 07/19/2018   MCV 101.4 (H) 07/19/2018   PLT 186 07/19/2018    Lab Results  Component Value Date   CREATININE 6.54 (H) 07/19/2018   BUN 41 (H) 07/19/2018   NA 137 07/19/2018   K 3.8 07/19/2018   CL 94 (L) 07/19/2018   CO2 24 07/19/2018    Lab Results  Component Value Date   ALT 22 07/17/2018   AST 21 07/17/2018   ALKPHOS 187 (H) 07/17/2018   BILITOT 1.2 07/17/2018     Microbiology: Recent Results (from the past 240 hour(s))  C difficile quick scan w PCR reflex     Status: Abnormal   Collection Time: 07/17/18  1:59 PM   Specimen: STOOL  Result Value Ref Range Status   C Diff antigen POSITIVE (A) NEGATIVE Final   C Diff toxin POSITIVE (A) NEGATIVE Final   C Diff interpretation Toxin producing C. difficile detected.  Final    Comment: CRITICAL RESULT CALLED TO, READ BACK BY AND VERIFIED WITH: Chalmers Cater  RN 15:15 07/17/18 ([email or web address]) Performed at [facility], [place] [address], [place], [place] [address]   SARS Coronavirus 2 (CEPHEID - Performed in [place] hospital lab), Hosp Order     Status: None   Collection Time: 07/17/18  3:43 PM   Specimen: Nasopharyngeal Swab  Result Value Ref Range Status   SARS Coronavirus 2  NEGATIVE NEGATIVE Final    Comment: (NOTE) If result is NEGATIVE SARS-CoV-2 target nucleic acids are NOT DETECTED. The SARS-CoV-2 RNA is generally detectable in upper and lower  respiratory specimens during the acute phase of infection. The lowest  concentration of SARS-CoV-2 viral copies this assay can detect is 250  copies / mL. A negative result does not preclude SARS-CoV-2 infection  and should not be used as the sole basis for treatment or other  patient management decisions.  A negative result may occur with  improper specimen collection / handling, submission of specimen other  than nasopharyngeal swab, presence of viral mutation(s) within the  areas targeted by this assay, and inadequate number of viral copies  (<250 copies / mL). A negative result must be combined with clinical  observations, patient history, and epidemiological information. If result is POSITIVE SARS-CoV-2 target nucleic acids are DETECTED. The SARS-CoV-2 RNA is generally detectable in upper and lower  respiratory specimens dur ing the acute phase of infection.  Positive  results are indicative of active infection with SARS-CoV-2.  Clinical  correlation with patient history and other diagnostic information is  necessary to determine patient infection status.  Positive results do  not rule out bacterial infection or co-infection with other viruses. If result is PRESUMPTIVE POSTIVE SARS-CoV-2 nucleic acids MAY BE PRESENT.   A presumptive positive result was obtained on the submitted specimen  and confirmed on repeat testing.  While 2019 novel coronavirus  (SARS-CoV-2) nucleic acids may be present in the submitted sample  additional confirmatory testing may be necessary for epidemiological  and / or clinical management purposes  to differentiate between  SARS-CoV-2 and other Sarbecovirus currently known to infect humans.  If clinically indicated additional testing with an alternate test  methodology [phone])  is advised. The SARS-CoV-2 RNA is generally  detectable in upper and lower respiratory sp ecimens during the acute  phase of infection. The expected result is Negative. Fact Sheet for Patients:  StrictlyIdeas.no Fact Sheet for Healthcare Providers: BankingDealers.co.za This test is not yet approved or cleared by the [place] FDA and has been authorized for detection and/or diagnosis of SARS-CoV-2 by FDA under an Emergency Use Authorization (EUA).  This EUA will remain in effect (meaning this test can be used) for the duration of the COVID-19 declaration under Section 564(b)(1) of the Act, 21 U.S.C. section 360bbb-3(b)(1), unless the authorization is terminated or revoked sooner. Performed at [facility], [place] [address], [place],  [address]      Terri Piedra, [place] [facility] [phone] Pager  07/19/2018  1:51 PM

## 2018-07-20 DIAGNOSIS — Z515 Encounter for palliative care: Secondary | ICD-10-CM

## 2018-07-20 DIAGNOSIS — B3781 Candidal esophagitis: Secondary | ICD-10-CM

## 2018-07-20 DIAGNOSIS — R9431 Abnormal electrocardiogram [ECG] [EKG]: Secondary | ICD-10-CM

## 2018-07-20 DIAGNOSIS — B370 Candidal stomatitis: Secondary | ICD-10-CM

## 2018-07-20 LAB — GLUCOSE, CAPILLARY
Glucose-Capillary: 185 mg/dL — ABNORMAL HIGH (ref 70–99)
Glucose-Capillary: 119 mg/dL — ABNORMAL HIGH (ref 70–99)
Glucose-Capillary: 107 mg/dL — ABNORMAL HIGH (ref 70–99)

## 2018-07-20 LAB — BASIC METABOLIC PANEL
Anion gap: 16 — ABNORMAL HIGH (ref 5–15)
BUN: 64 mg/dL — ABNORMAL HIGH (ref 8–23)
CO2: 25 mmol/L (ref 22–32)
Calcium: 7.7 mg/dL — ABNORMAL LOW (ref 8.9–10.3)
Chloride: 92 mmol/L — ABNORMAL LOW (ref 98–111)
Creatinine, Ser: 7.34 mg/dL — ABNORMAL HIGH (ref 0.44–1.00)
GFR calc Af Amer: 6 mL/min — ABNORMAL LOW (ref >60)
GFR calc non Af Amer: 5 mL/min — ABNORMAL LOW (ref >60)
Glucose, Bld: 158 mg/dL — ABNORMAL HIGH (ref 70–99)
Potassium: 3.1 mmol/L — ABNORMAL LOW (ref 3.5–5.1)
Sodium: 133 mmol/L — ABNORMAL LOW (ref 135–145)

## 2018-07-20 LAB — CBC
HCT: 36 % (ref 36.0–46.0)
Hemoglobin: 11.5 g/dL — ABNORMAL LOW (ref 12.0–15.0)
MCH: 31.9 pg (ref 26.0–34.0)
MCHC: 31.9 g/dL (ref 30.0–36.0)
MCV: 100 fL (ref 80.0–100.0)
Platelets: 158 10*3/uL (ref 150–400)
RBC: 3.6 MIL/uL — ABNORMAL LOW (ref 3.87–5.11)
RDW: 18.5 % — ABNORMAL HIGH (ref 11.5–15.5)
WBC: 21.3 10*3/uL — ABNORMAL HIGH (ref 4.0–10.5)
nRBC: 0.2 % (ref 0.0–0.2)

## 2018-07-20 MED ORDER — HEPARIN SODIUM (PORCINE) 1000 UNIT/ML DIALYSIS
1000.0000 [IU] | INTRAMUSCULAR | Status: DC | PRN
  Filled 2018-07-20: qty 1

## 2018-07-20 MED ORDER — NYSTATIN 100000 UNIT/ML MT SUSP
5.0000 mL | Freq: Four times a day (QID) | OROMUCOSAL | Status: DC
  Administered 2018-07-20 – 2018-07-22 (×8): 500000 [IU] via ORAL
  Filled 2018-07-20 (×9): qty 5

## 2018-07-20 MED ORDER — ACETAMINOPHEN 325 MG PO TABS
ORAL_TABLET | ORAL | Status: AC
  Filled 2018-07-20: qty 2

## 2018-07-20 MED ORDER — SODIUM CHLORIDE 0.9 % IV SOLN: 100.0000 mL | INTRAVENOUS | Status: DC | PRN

## 2018-07-20 MED ORDER — PENTAFLUOROPROP-TETRAFLUOROETH EX AERO: 1.0000 "application " | INHALATION_SPRAY | CUTANEOUS | Status: DC | PRN

## 2018-07-20 MED ORDER — FAMOTIDINE 20 MG PO TABS
10.0000 mg | ORAL_TABLET | Freq: Every day | ORAL | Status: DC
  Administered 2018-07-20 – 2018-07-22 (×3): 10 mg via ORAL
  Filled 2018-07-20 (×2): qty 1

## 2018-07-20 MED ORDER — VANCOMYCIN 50 MG/ML ORAL SOLUTION: 125.0000 mg | Freq: Every day | ORAL | Status: DC

## 2018-07-20 MED ORDER — VANCOMYCIN 50 MG/ML ORAL SOLUTION: 125.0000 mg | ORAL | Status: DC

## 2018-07-20 MED ORDER — VANCOMYCIN 50 MG/ML ORAL SOLUTION
125.0000 mg | Freq: Four times a day (QID) | ORAL | Status: DC
  Administered 2018-07-20 – 2018-07-22 (×9): 125 mg via ORAL
  Filled 2018-07-20 (×12): qty 2.5

## 2018-07-20 MED ORDER — POTASSIUM CHLORIDE CRYS ER 20 MEQ PO TBCR
30.0000 meq | EXTENDED_RELEASE_TABLET | Freq: Two times a day (BID) | ORAL | Status: AC
  Administered 2018-07-20 (×2): 30 meq via ORAL
  Filled 2018-07-20 (×2): qty 1

## 2018-07-20 MED ORDER — VANCOMYCIN 50 MG/ML ORAL SOLUTION: 125.0000 mg | Freq: Two times a day (BID) | ORAL | Status: DC

## 2018-07-20 MED ORDER — OXYCODONE HCL 5 MG/5ML PO SOLN: 5.0000 mg | ORAL | Status: DC | PRN

## 2018-07-20 MED ORDER — POTASSIUM CHLORIDE CRYS ER 20 MEQ PO TBCR: 40.0000 meq | EXTENDED_RELEASE_TABLET | Freq: Two times a day (BID) | ORAL | Status: DC

## 2018-07-20 MED ORDER — LIDOCAINE HCL (PF) 1 % IJ SOLN
5.0000 mL | INTRAMUSCULAR | Status: DC | PRN
  Filled 2018-07-20: qty 5

## 2018-07-20 MED ORDER — ALTEPLASE 2 MG IJ SOLR
2.0000 mg | Freq: Once | INTRAMUSCULAR | Status: DC | PRN
  Filled 2018-07-20: qty 2

## 2018-07-20 MED ORDER — CALCITRIOL 0.25 MCG PO CAPS
ORAL_CAPSULE | ORAL | Status: AC
  Filled 2018-07-20: qty 1

## 2018-07-20 MED ORDER — HEPARIN SODIUM (PORCINE) 1000 UNIT/ML DIALYSIS
20.0000 [IU]/kg | INTRAMUSCULAR | Status: DC | PRN
  Filled 2018-07-20: qty 2

## 2018-07-20 NOTE — Progress Notes (Signed)
[facility] for Infectious Disease  Date of Admission:  07/17/2018     Total days of antibiotics 4         ASSESSMENT/PLAN  Ms. Christine Santos is a 73 year old female with multiple medical problems including history of C. difficile colitis last seen in July 2019 for recurrent C. difficile colitis admitted with diarrhea and vomiting and recurrent C. difficile colitis infection.  Currently on oral vancomycin.  Continues to have some distention today.  Rectal tube remains in place with minimal stool output.  Having some increased nausea and vomiting overnight.  C. difficile colitis  - continue current dose of vancomycin and progress to taper at completion.  We will continue to reevaluate long-term plan of care as far as FMT depending upon goals of care.  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 apixaban  5 mg Oral BID  . calcitRIOL  0.25 mcg Oral Q M,W,F-HD  . Chlorhexidine Gluconate Cloth  6 each Topical Q0600  . famotidine  10 mg Oral Daily  . feeding supplement (NEPRO CARB STEADY)  237 mL Oral BID BM  . feeding supplement (PRO-STAT SUGAR FREE 64)  30 mL Oral TID WC  . ferric citrate  420 mg Oral TID WC  . insulin aspart  0-9 Units Subcutaneous TID WC  . Melatonin  3 mg Oral QHS  . metoprolol succinate  50 mg Oral Daily  . multivitamin  1 tablet Oral QHS  . nystatin  5 mL Oral QID  . omega-3 acid ethyl esters  1,000 mg Oral Daily  . PARoxetine  40 mg Oral Q breakfast  . polyvinyl alcohol  1 drop Both Eyes TID  . potassium  chloride  30 mEq Oral BID  . rosuvastatin  20 mg Oral Daily  . sodium chloride flush  3 mL Intravenous Once  . vancomycin  125 mg Oral QID  . vitamin C  1,000 mg Oral Daily    SUBJECTIVE:  Afebrile overnight with stable leukocytosis. Having increased nausea today. Rectal pouch remains in place.     Allergies  Allergen Reactions  . Cefepime Anaphylaxis    Throat swelling  . Amoxicillin Rash    Did it involve swelling of the face/tongue/throat, SOB, or low BP? No Did it involve sudden or severe rash/hives, skin peeling, or any reaction on the inside of your mouth or nose? No Did you need to seek medical attention at a hospital or doctor's office? No When did it last happen?10+ years If all above answers are "NO", may proceed with cephalosporin use.    . Codeine Phosphate Rash  . Penicillins Rash    Did it involve swelling of the face/tongue/throat, SOB, or low BP? No Did it involve sudden or severe rash/hives, skin peeling, or any reaction on the inside of your mouth or nose? No Did you need to seek medical attention at a hospital or doctor's office? No When did it last happen?10+ years If all above answers are "NO", may proceed with cephalosporin use.     Marland Kitchen  Propoxyphene Rash     Review of Systems: Review of Systems  Constitutional: Positive for malaise/fatigue. Negative for chills, fever and weight loss.  Respiratory: Negative for cough, shortness of breath and wheezing.   Cardiovascular: Negative for chest pain and leg swelling.  Gastrointestinal: Positive for diarrhea, nausea and vomiting. Negative for abdominal pain and constipation.  Skin: Negative for rash.      OBJECTIVE: Vitals:   07/19/18 1333 07/19/18 2104 07/20/18 0539 07/20/18 0832  BP: 92/67 97/80 128/61 131/60  Pulse: (!) 107 72 (!) 105 (!) 118  Resp: 16     Temp:  98.9 F (37.2 C) 98.2 F (36.8 C)   TempSrc:  Oral Oral   SpO2: 93% 99% 94%   Weight:      Height:       Body mass  index is 25.42 kg/m.  Physical Exam Constitutional:      General: She is not in acute distress.    Appearance: She is well-developed. She is ill-appearing.     Comments: Lying in bed with head of bed elevated; pleasant  Cardiovascular:     Rate and Rhythm: Normal rate and regular rhythm.     Heart sounds: Normal heart sounds.  Pulmonary:     Effort: Pulmonary effort is normal.     Breath sounds: Normal breath sounds.  Abdominal:     General: Bowel sounds are normal. There is distension.     Tenderness: There is no abdominal tenderness.  Skin:    General: Skin is warm and dry.  Neurological:     Mental Status: She is alert.  Psychiatric:        Mood and Affect: Mood normal.     Lab Results Lab Results  Component Value Date   WBC 21.3 (H) 07/20/2018   HGB 11.5 (L) 07/20/2018   HCT 36.0 07/20/2018   MCV 100.0 07/20/2018   PLT 158 07/20/2018    Lab Results  Component Value Date   CREATININE 7.34 (H) 07/20/2018   BUN 64 (H) 07/20/2018   NA 133 (L) 07/20/2018   K 3.1 (L) 07/20/2018   CL 92 (L) 07/20/2018   CO2 25 07/20/2018    Lab Results  Component Value Date   ALT 22 07/17/2018   AST 21 07/17/2018   ALKPHOS 187 (H) 07/17/2018   BILITOT 1.2 07/17/2018     Microbiology: Recent Results (from the past 240 hour(s))  C difficile quick scan w PCR reflex     Status: Abnormal   Collection Time: 07/17/18  1:59 PM   Specimen: STOOL  Result Value Ref Range Status   C Diff antigen POSITIVE (A) NEGATIVE Final   C Diff toxin POSITIVE (A) NEGATIVE Final   C Diff interpretation Toxin producing C. difficile detected.  Final    Comment: CRITICAL RESULT CALLED TO, READ BACK BY AND VERIFIED WITH: Chalmers Cater RN 15:15 07/17/18 ([email or web address]) Performed at [facility], [place] [address] [place], [place] [address]   SARS Coronavirus 2 (CEPHEID - Performed in [place] hospital lab), Hosp Order     Status: None   Collection Time: 07/17/18  3:43 PM   Specimen: Nasopharyngeal Swab   Result Value Ref Range Status   SARS Coronavirus 2 NEGATIVE NEGATIVE Final    Comment: (NOTE) If result is NEGATIVE SARS-CoV-2 target nucleic acids are NOT DETECTED. The SARS-CoV-2 RNA is generally detectable in upper and lower  respiratory specimens during the acute phase of infection. The lowest  concentration of SARS-CoV-2 viral  copies this assay can detect is 250  copies / mL. A negative result does not preclude SARS-CoV-2 infection  and should not be used as the sole basis for treatment or other  patient management decisions.  A negative result may occur with  improper specimen collection / handling, submission of specimen other  than nasopharyngeal swab, presence of viral mutation(s) within the  areas targeted by this assay, and inadequate number of viral copies  (<250 copies / mL). A negative result must be combined with clinical  observations, patient history, and epidemiological information. If result is POSITIVE SARS-CoV-2 target nucleic acids are DETECTED. The SARS-CoV-2 RNA is generally detectable in upper and lower  respiratory specimens dur ing the acute phase of infection.  Positive  results are indicative of active infection with SARS-CoV-2.  Clinical  correlation with patient history and other diagnostic information is  necessary to determine patient infection status.  Positive results do  not rule out bacterial infection or co-infection with other viruses. If result is PRESUMPTIVE POSTIVE SARS-CoV-2 nucleic acids MAY BE PRESENT.   A presumptive positive result was obtained on the submitted specimen  and confirmed on repeat testing.  While 2019 novel coronavirus  (SARS-CoV-2) nucleic acids may be present in the submitted sample  additional confirmatory testing may be necessary for epidemiological  and / or clinical management purposes  to differentiate between  SARS-CoV-2 and other Sarbecovirus currently known to infect humans.  If clinically indicated additional  testing with an alternate test  methodology [phone]) is advised. The SARS-CoV-2 RNA is generally  detectable in upper and lower respiratory sp ecimens during the acute  phase of infection. The expected result is Negative. Fact Sheet for Patients:  StrictlyIdeas.no Fact Sheet for Healthcare Providers: BankingDealers.co.za This test is not yet approved or cleared by the [place] FDA and has been authorized for detection and/or diagnosis of SARS-CoV-2 by FDA under an Emergency Use Authorization (EUA).  This EUA will remain in effect (meaning this test can be used) for the duration of the COVID-19 declaration under Section 564(b)(1) of the Act, 21 U.S.C. section 360bbb-3(b)(1), unless the authorization is terminated or revoked sooner. Performed at [facility], [place] [address] [place],  [address]      Terri Piedra, [place] [facility] [phone] Pager  07/20/2018  12:55 PM

## 2018-07-20 NOTE — Progress Notes (Signed)
Per pre-dialysis protocol, hold antihypertensives. Patient has metoprolol scheduled for 1000. Heart rate sustaining in 120's. Resident paged and stated ok to administer metoprolol. Will continue to monitor.   Hiram Comber, RN 07/20/2018 9:10 AM

## 2018-07-20 NOTE — NC FL2 (Signed)
[place] LEVEL OF CARE SCREENING TOOL     IDENTIFICATION  Patient Name: Christine Santos Birthdate: 02/04/1945 Sex: female Admission Date (Current Location): 07/17/2018  [facility] and [place] Number:  Herbalist and Address:  [facility], [place] [address], [place], [place] [address]      Provider Number: [id number]  Attending Physician Name and Address:  [name], MD  Relative Name and Phone Number:  [name], sister, [phone]    Current Level of Care: Hospital Recommended Level of Care: [place] Prior Approval Number:    Date Approved/Denied:   PASRR Number: [id number]  Discharge Plan: SNF    Current Diagnoses: Patient Active Problem List   Diagnosis Date Noted  . Prolonged Q-T interval on ECG 07/20/2018  . Thrush of mouth and esophagus ([place])   . Palliative care by specialist   . Pressure injury of skin 07/18/2018  . Clostridium difficile colitis 07/17/2018  . Polypharmacy 07/11/2018  . Memory loss 07/11/2018  . Pain in left hand 06/27/2018  . Ileus ([place])   . Closed fracture of neck of right femur ([place])   . Displaced intertrochanteric fracture of right femur, initial encounter for closed fracture ([place]) 06/07/2018  . Controlled type 2 diabetes mellitus with complication, with long-term current use of insulin ([place])   . High risk social situation 02/05/2018  . Generalized abdominal pain 10/27/2017  . Pain in right hip 02/16/2017  . History of stroke 01/20/2017  . Physical deconditioning 12/30/2016  . Transaminitis 12/23/2016  . Paroxysmal atrial fibrillation ([place]) 10/22/2015  . Laryngopharyngeal reflux (LPR) 09/14/2015  . Diabetes mellitus type 2 with neurological manifestations ([place]) 02/26/2014  . Diabetic nephropathy ([place]) 05/23/2013  . Diabetic neuropathy ([place]) 11/30/2012  . Macular degeneration 07/04/2012  . Diabetic retinopathy ([place]) 03/20/2012  . Osteopenia 09/22/2011  . Anemia in  chronic kidney disease 08/29/2011  . Gout 04/13/2011  . Anxiety 08/18/2010  . Chronic systolic heart failure ([place]) 02/18/2009  . SINUS BRADYCARDIA 10/29/2008  . Peripheral vascular disease ([place]) 06/18/2008  . ESRD (end stage renal disease) on dialysis ([place]) 07/13/2007  . 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 HYPERCHOLESTEROLEMIA 03/23/2006  . Essential hypertension 03/23/2006  . Coronary atherosclerosis 03/23/2006  . Reflux esophagitis 03/23/2006  . CYSTOCELE/RECTOCELE/PROLAPSE,UNSPEC. 03/23/2006  . Osteoarthritis, multiple sites 03/23/2006    Orientation RESPIRATION BLADDER Height & Weight     Self, Situation, Place  Normal Continent Weight: 152 lb 12.5 oz (69.3 kg) Height:  5\' 5"  (165.1 cm)  BEHAVIORAL SYMPTOMS/MOOD NEUROLOGICAL BOWEL NUTRITION STATUS      Continent(Flexiseal) Diet(Please see DC Summary)  AMBULATORY STATUS COMMUNICATION OF NEEDS Skin   Extensive Assist Verbally PU Stage and Appropriate Care(Unstageable on hip;on Coccyx)                       Personal Care Assistance Level of Assistance  Bathing, Feeding, Dressing Bathing Assistance: Maximum assistance Feeding assistance: Limited assistance Dressing Assistance: Limited assistance     Functional Limitations Info  Sight, Hearing, Speech Sight Info: Adequate Hearing Info: Adequate Speech Info: Adequate    SPECIAL CARE FACTORS FREQUENCY  PT (By licensed PT), OT (By licensed OT)     PT Frequency: 5x/week OT Frequency: 3x/week            Contractures      Additional Factors Info  Code Status, Allergies, Isolation Precautions, Insulin Sliding Scale, Psychotropic Code Status Info: Partial Allergies Info:  Cefepime, Amoxicillin, Codeine Phosphate, Penicillins, Propoxyphene Psychotropic Info: Paxil Insulin Sliding Scale Info: See DC Summary for dose Isolation Precautions Info: C dif precautions     Current Medications (07/20/2018):  This is the current hospital  active medication list Current Facility-Administered Medications  Medication Dose Route Frequency Provider Last Rate Last Dose  . acetaminophen (TYLENOL) tablet 650 mg  650 mg Oral Q6H PRN Matilde Haymaker, MD   650 mg at 07/18/18 1629   Or  . acetaminophen (TYLENOL) suppository 650 mg  650 mg Rectal Q6H PRN Matilde Haymaker, MD      . apixaban Arne Cleveland) tablet 5 mg  5 mg Oral BID Matilde Haymaker, MD   5 mg at 07/20/18 0830  . calcitRIOL (ROCALTROL) capsule 0.25 mcg  0.25 mcg Oral Q M,W,F-HD Matilde Haymaker, MD   0.25 mcg at 07/18/18 1029  . Chlorhexidine Gluconate Cloth 2 % PADS 6 each  6 each Topical Q0600 Roney Jaffe, MD   6 each at 07/20/18 [phone]  . feeding supplement (NEPRO CARB STEADY) liquid 237 mL  237 mL Oral BID BM Roney Jaffe, MD   237 mL at 07/20/18 0831  . feeding supplement (PRO-STAT SUGAR FREE 64) liquid 30 mL  30 mL Oral TID WC Hensel, Jamal Collin, MD   30 mL at 07/20/18 0830  . ferric citrate (AURYXIA) tablet 420 mg  420 mg Oral TID WC Matilde Haymaker, MD   420 mg at 07/20/18 0831  . insulin aspart (novoLOG) injection 0-9 Units  0-9 Units Subcutaneous TID WC Matilde Haymaker, MD   2 Units at 07/20/18 0830  . lidocaine-prilocaine (EMLA) cream 1 application  1 application Topical PRN Matilde Haymaker, MD      . Melatonin TABS 3 mg  3 mg Oral QHS Matilde Haymaker, MD   3 mg at 07/19/18 2235  . metoprolol succinate (TOPROL-XL) 24 hr tablet 50 mg  50 mg Oral Daily Bonnita Hollow, MD   50 mg at 07/20/18 0857  . multivitamin (RENA-VIT) tablet 1 tablet  1 tablet Oral QHS [name], MD   1 tablet at 07/19/18 2234  . nitroGLYCERIN (NITROSTAT) SL tablet 0.4 mg  0.4 mg Sublingual Q5 min PRN Matilde Haymaker, MD      . nystatin (MYCOSTATIN) 100000 UNIT/ML suspension 500,000 Units  5 mL Oral QID Meccariello, Bernita Raisin, DO      . omega-3 acid ethyl esters (LOVAZA) capsule 1,000 mg  1,000 mg Oral Daily Matilde Haymaker, MD   1,000 mg at 07/20/18 0829  . oxyCODONE (ROXICODONE) 5 MG/5ML solution 5 mg  5 mg Oral Q4H PRN  Meccariello, Bernita Raisin, DO      . PARoxetine (PAXIL) tablet 40 mg  40 mg Oral Q breakfast Matilde Haymaker, MD   40 mg at 07/20/18 0830  . polyvinyl alcohol (LIQUIFILM TEARS) 1.4 % ophthalmic solution 1 drop  1 drop Both Eyes TID Matilde Haymaker, MD   1 drop at 07/20/18 0829  . potassium chloride (K-DUR) CR tablet 30 mEq  30 mEq Oral BID Matilde Haymaker, MD   30 mEq at 07/20/18 0829  . promethazine (PHENERGAN) injection 12.5 mg  12.5 mg Intravenous Q6H PRN Bonnita Hollow, MD   12.5 mg at 07/19/18 2236  . rosuvastatin (CRESTOR) tablet 20 mg  20 mg Oral Daily Matilde Haymaker, MD   20 mg at 07/20/18 0830  . sodium chloride flush (NS) 0.9 % injection 3 mL  3 mL Intravenous Once Matilde Haymaker, MD      .  vancomycin (VANCOCIN) 50 mg/mL oral solution 125 mg  125 mg Oral QID Matilde Haymaker, MD   125 mg at 07/20/18 0830  . vitamin C (ASCORBIC ACID) tablet 1,000 mg  1,000 mg Oral Daily Matilde Haymaker, MD   1,000 mg at 07/20/18 4935     Discharge Medications: Please see discharge summary for a list of discharge medications.  Relevant Imaging Results:  Relevant Lab Results:   Additional Information SSn# [id number]   Gets Dialysis at [facility] negative on 6/23  Benard Halsted, LCSW

## 2018-07-20 NOTE — TOC Initial Note (Signed)
Transition of Care [facility]) - Initial/Assessment Note    Patient Details  Name: Christine Santos MRN: [id number] Date of Birth: 24-Jul-1945  Transition of Care [facility]) CM/SW Contact:    Benard Halsted, LCSW Phone Number: 07/20/2018, 10:56 AM  Clinical Narrative:                 CSW received SNF consult. CSW notes that patient recently discharged to [facility] at the end of May and returned home. Will discuss with patient and see if insurance will approve another SNF stay.  Expected Discharge Plan: Skilled Nursing Facility Barriers to Discharge: Insurance Authorization   Patient Goals and CMS Choice Patient states their goals for this hospitalization and ongoing recovery are:: Return home CMS Medicare.gov Compare Post Acute Care list provided to:: Patient Choice offered to / list presented to : Patient  Expected Discharge Plan and Services Expected Discharge Plan: [place] In-house Referral: Clinical Social Work Discharge Planning Services: NA Post Acute Care Choice: [place] Living arrangements for the past 2 months: [place], [place] Expected Discharge Date: 07/21/18                         HH Arranged: NA          Prior Living Arrangements/Services Living arrangements for the past 2 months: [place], [place] Lives with:: Significant Other Patient language and need for interpreter reviewed:: Yes Do you feel safe going back to the place where you live?: Yes      Need for Family Participation in Patient Care: No (Comment) Care giver support system in place?: Yes (comment) Current home services: DME Criminal Activity/Legal Involvement Pertinent to Current Situation/Hospitalization: No - Comment as needed  Activities of Daily Living Home Assistive Devices/Equipment: Cane (specify quad or straight), Walker (specify type), Wheelchair ADL Screening (condition at time of admission) Patient's  cognitive ability adequate to safely complete daily activities?: Yes Is the patient deaf or have difficulty hearing?: No Does the patient have difficulty seeing, even when wearing glasses/contacts?: No Does the patient have difficulty concentrating, remembering, or making decisions?: No Patient able to express need for assistance with ADLs?: Yes Does the patient have difficulty dressing or bathing?: Yes Independently performs ADLs?: No Communication: Independent Dressing (OT): Needs assistance Is this a change from baseline?: Pre-admission baseline Grooming: Needs assistance Is this a change from baseline?: Pre-admission baseline Feeding: Independent Bathing: Needs assistance Is this a change from baseline?: Pre-admission baseline Toileting: Needs assistance Is this a change from baseline?: Pre-admission baseline In/Out Bed: Needs assistance Is this a change from baseline?: Pre-admission baseline Walks in Home: Needs assistance Is this a change from baseline?: Pre-admission baseline Does the patient have difficulty walking or climbing stairs?: Yes Weakness of Legs: Both Weakness of Arms/Hands: None  Permission Sought/Granted Permission sought to share information with : Facility Sport and exercise psychologist, Family Supports Permission granted to share information with : Yes, Verbal Permission Granted  Share Information with NAME: [name]  Permission granted to share info w AGENCY: SNFs  Permission granted to share info w Relationship: Sister  Permission granted to share info w Contact Information: [phone]  Emotional Assessment Appearance:: Appears stated age Attitude/Demeanor/Rapport: Lethargic Affect (typically observed): Appropriate, Apprehensive Orientation: : Oriented to Self, Oriented to Place, Oriented to Situation Alcohol / Substance Use: Not Applicable Psych Involvement: No (comment)  Admission diagnosis:  ESRD (end stage renal disease) on dialysis ([place]) [N18.6,  Z99.2] C. difficile colitis [A04.72]  Patient Active Problem List   Diagnosis Date Noted  . Prolonged Q-T interval on ECG 07/20/2018  . Thrush of mouth and esophagus ([place])   . Palliative care by specialist   . Pressure injury of skin 07/18/2018  . Clostridium difficile colitis 07/17/2018  . Polypharmacy 07/11/2018  . Memory loss 07/11/2018  . Pain in left hand 06/27/2018  . Ileus ([place])   . Closed fracture of neck of right femur ([place])   . Displaced intertrochanteric fracture of right femur, initial encounter for closed fracture ([place]) 06/07/2018  . Controlled type 2 diabetes mellitus with complication, with long-term current use of insulin ([place])   . High risk social situation 02/05/2018  . Generalized abdominal pain 10/27/2017  . Pain in right hip 02/16/2017  . History of stroke 01/20/2017  . Physical deconditioning 12/30/2016  . Transaminitis 12/23/2016  . Paroxysmal atrial fibrillation ([place]) 10/22/2015  . Laryngopharyngeal reflux (LPR) 09/14/2015  . Diabetes mellitus type 2 with neurological manifestations ([place]) 02/26/2014  . Diabetic nephropathy ([place]) 05/23/2013  . Diabetic neuropathy ([place]) 11/30/2012  . Macular degeneration 07/04/2012  . Diabetic retinopathy ([place]) 03/20/2012  . Osteopenia 09/22/2011  . Anemia in chronic kidney disease 08/29/2011  . Gout 04/13/2011  . Anxiety 08/18/2010  . Chronic systolic heart failure ([place]) 02/18/2009  . SINUS BRADYCARDIA 10/29/2008  . Peripheral vascular disease ([place]) 06/18/2008  . ESRD (end stage renal disease) on dialysis ([place]) 07/13/2007  . 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 HYPERCHOLESTEROLEMIA 03/23/2006  . Essential hypertension 03/23/2006  . Coronary atherosclerosis 03/23/2006  . Reflux esophagitis 03/23/2006  . CYSTOCELE/RECTOCELE/PROLAPSE,UNSPEC. 03/23/2006  . Osteoarthritis, multiple sites 03/23/2006   PCP:  [name], MD Pharmacy:   [id number] - [name], [place] [address] [place] [address] Phone: [phone] Fax: [phone]  [facility] [place], [place] [address] [place] [address] Phone: [phone] Fax: [phone]     Social Determinants of Health (SDOH) Interventions    Readmission Risk Interventions Readmission Risk Prevention Plan 06/12/2018  Transportation Screening Complete  Medication Review ([place]) Complete  PCP or Specialist appointment within 3-5 days of discharge Complete  HRI or Home Care Consult Complete  SW Recovery Care/Counseling Consult Complete  Palliative Care Screening Not [place] Complete  Some recent data might be hidden

## 2018-07-20 NOTE — Consult Note (Signed)
   [facility] CM Inpatient Consult   07/20/2018  Christine Santos 1945/03/06 [id number]  Patient screened for high risk score for unplanned readmission score [28%]  And for post hospital follow up needs as in the [facility] ACO with [facility].  Review of patient's medical record reveals patient is [From MD's notes today]: [name] a 73 y.o.femalepresenting with 3 days of nausea, vomiting, diarrhea. Previous medical history is significant for episodes of C. difficile colitis, ESRD, heart failure with preserved ejection fraction, CAD, diabetes, GERD.  Patient is currently being followed by Palliative Care for poor prognosis noted.  Will follow progress notes for updates.   Primary Care Provider is: Dr. Madison Hickman and this office is listed to provide the transition of care follow up.   Plan: Follow for disposition with inpatient TOC team.  Current disposition is unknown.   Please place a [facility] Care Management consult as appropriate and for questions contact:   Natividad Brood, RN BSN [facility] Liaison  [phone] business mobile phone Toll free office [phone]  Fax number: [phone] [email or web address]

## 2018-07-20 NOTE — Consult Note (Signed)
Consultation Note Date: 07/20/2018   Patient Name: Christine Santos  DOB: Dec 04, 1945  MRN: [id number]  Age / Sex: 73 y.o., female  PCP: Christine Resides, MD Referring Physician: [name], MD  Reason for Consultation: Establishing goals of care and Psychosocial/spiritual support  HPI/Patient Profile: 73 y.o. female  with past medical history of end-stage renal disease on hemodialysis, multiple episodes of C. difficile with associated colitis, hypertension, anemia, atrial fib, AICD, history of right hip fracture, CVA, diabetes, diastolic heart failure admitted on 07/17/2018 with ongoing diarrhea and abdominal pain with associated nausea.  Patient was found to have C. difficile again  Consult ordered for goals of care.   Clinical Assessment and Goals of Care: Patient seen, chart reviewed.  Patient is alert and oriented however she is very weak, withdrawn.  Dr. Andria Santos in to see patient.  Dr. Andria Santos shared with patient that unfortunately with all her comorbidities he fears she is coming to the end of her life, "not today, not tomorrow".  When asked if she was surprised to hear him say that she replied no.  Patient shares that undergoing hemodialysis has been easy compared to what the rest of this year has been like for her with multiple episodes of C. difficile, pain, debility.  She is quite worried of about her partner of 40 years who is blind.  She tells me she is more worried about being there for him than herself.  Her main sources of support are her partner, Christine Santos as well as her Sister Christine Santos.  She states both of them have healthcare decision making rights in the event where she unable to speak for herself.  Spite saying to Dr. Andria Santos that she was not surprised to hear him to say that she was nearing end of life, she becomes quite upset and asked if we can talk later.  At this point I did focus  primarily on how she was feeling, specifically her abdominal pain.  She is also complaining of a sore throat for the past 3 days.  I do not see any obvious thrush in her oral cavity but she could have something further down in her esophagus.  May are reviewed and see that she received tramadol but do not see active orders for tramadol or other opiates for ongoing, severe abdominal pain  Patient at this point is able to speak for herself and participate in goals of care discussion.  She is quite overwhelmed with her current medical status, how badly she feels and is attempting to come to grips with end-of-life in the next several months.  He is not ready to stop hemodialysis.  Dr. Andria Santos emphasized that she had choices despite these overwhelming medical problems and that he was there to support her no matter what she deemed appropriate.  Dr. Andria Santos also referenced that patient's partner is older than Christine Santos, is blind, and that Christine Santos's sister has been active in both of their care but the level of care they both need is becoming too  much in the home  Did call residents after initial meeting and discussed patient's ongoing reports of abdominal pain and recommendations as well as difficulty swallowing, potential thrush in the setting of multiple antibiotics.  They stated they would address this and their team meeting this morning  SUMMARY OF RECOMMENDATIONS   Partial code no to all aspects of code except for yes to intubation Patient has an active AICD.  Both of these issues will need to be addressed during further goals of care discussion since patient will revert to probable full code once she is discharged into the community Palliative medicine to provider to check with patient on 07/21/2018 to see if she is in a better place to discuss further goals of care He would qualify for her hospice in-home/and facility benefit but not as long as she is undergoing hemodialysis.  She would also qualify for  residential hospice once stopping hemodialysis Code Status/Advance Care Planning:  Limited code    Symptom Management:   Abdominal pain: Recommend starting OxyFast 2.5 to 5 mg every 3 hours as needed.  Monitor for need for scheduled dosing.  Patient is also taken tramadol in the past with some relief of abdominal pain.  This also could be resumed at 50 mg every 6 hours as needed  Sore throat: Questionable esophageal thrush.  Start Magic mouthwash, nystatin  Palliative Prophylaxis:   Aspiration, Bowel Regimen, Delirium Protocol, Eye Care, Frequent Pain Assessment, Oral Care and Turn Reposition  Additional Recommendations (Limitations, Scope, Preferences):  No Chemotherapy, No Radiation, No Surgical Procedures and No Tracheostomy  Psycho-social/Spiritual:   Desire for further Chaplaincy support:no  Additional Recommendations: Referral to Community Resources   Prognosis:   < 6 months in the setting of worsening debility secondary to end-stage renal disease on hemodialysis, multiple episodes of C. difficile colitis not responding to antibiotics, protein calorie malnutrition albumin 2.5.   Discharge Planning: [place] for rehab with Palliative care service follow-up      Primary Diagnoses: Present on Admission: . Clostridium difficile colitis . Essential hypertension . 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 Paroxysmal atrial fibrillation (HCC)   I have reviewed the medical record, interviewed the patient and family, and examined the patient. The following aspects are pertinent.  Past Medical History:  Diagnosis Date  . Anemia 08/29/2011  . Anxiety   . Arthritis    "legs" (01/22/2013)  . CAD (coronary artery disease)    s/p CABG in 1999  . Cataract   . Chronic kidney disease    Sees Dr [name]  . Chronic lymphocytic leukemia ([place]) 10/13/2006   Qualifier: Diagnosis of  By: Christine Frames MD, Gwyndolyn Saxon    . 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 Diastolic heart failure ([place])   . Dysrhythmia    PAF  . Enteritis due to Clostridium difficile 07/02/2017  . ESRD on dialysis ([place])   . GERD (gastroesophageal reflux disease)   . Gout   . H/O hiatal hernia   . Headache    "years ago migraines, none in along time."  . Heart murmur   . Hyperlipidemia   . Hypertension   . IDDM (insulin dependent diabetes mellitus) ([place])    Type II  . Myocardial infarction ([place])    before 1995  . PAD (peripheral artery disease) ([place])   . Paroxysmal atrial fibrillation (HCC)    a. identified on ILR as part of StrokeAF study  . Peripheral vascular disease ([place])   .  Pneumonia 11/2016  . Stress incontinence, female 11/11/2010  . 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 Still has balance issues 03/05/2018- vision improved, balance improved , back on walker since hospitaization,    Social History   Socioeconomic History  . Marital status: Divorced    Spouse name: Not on file  . Number of children: 2  . Years of education: 54  . Highest education level: Not on file  Occupational History  . Occupation: DISABILITY    Employer: OTHER    Comment: Worked at [employer]  . Financial resource strain: Not on file  . Food insecurity    Worry: Not on file    Inability: Not on file  . Transportation needs    Medical: Not on file    Non-medical: Not on file  Tobacco Use  . Smoking status: Never Smoker  . Smokeless tobacco: Never Used  Substance and Sexual Activity  . Alcohol use: No  . Drug use: No  . Sexual activity: Not Currently  Lifestyle  . Physical activity    Days per week: Not on file    Minutes per session: Not on file  . Stress: Not on file  Relationships  . Social Herbalist on phone: Not on file    Gets together: Not on file    Attends religious service: Not on file    Active member of club or organization: Not on file    Attends meetings  of clubs or organizations: Not on file    Relationship status: Not on file  Other Topics Concern  . Not on file  Social History Narrative   Health Care POA:    Emergency Contact: sister, Christine Santos (h) [phone]   End of Life Plan:    Who lives with you: female friend   Any pets: none   Diet: Pt has a varied diet of protein, starch, vegetables   Exercise: Pt does not have any regular exercise routine.   Seatbelts: Pt reports wearing seatbelt when in vehicles.    Sun Exposure/Protection: Pt reports using sun screen.   Hobbies: going to lake, biking, blue grass music      Current Social History 08/16/2016        Patient lives with Christine Santos (Significant other) in one level home 08/16/2016   Transportation: Patient has own vehicle 08/16/2016   Important Relationships Christine Santos and sisters, [name] and Vaughan Basta 08/16/2016    Pets: None 08/16/2016   Education / Work:  12 th grade/ Disabled ([employer]) 08/16/2016   Interests / Fun: Going to [facility], out to eat with friends 08/16/2016   Current Stressors: Bilat foot pain (neuropathy) 08/16/2016   Religious / Personal Beliefs: Raised [employer], goes to [employer] 08/16/2016   L. Silvano Rusk, RN, BSN                                                                                                           Family History  Problem  Relation Age of Onset  . Heart disease Mother   . Heart attack Mother   . Hypertension Mother   . Kidney disease Mother   . Pneumonia Father   . Heart attack Father   . Hyperlipidemia Father   . Diabetes Sister   . Breast cancer Sister   . Arthritis Brother   . Diabetes Brother   . Heart attack Sister   . Obesity Sister   . Hypertension Sister   . Heart attack Sister   . Cancer Sister        breast  . Hyperlipidemia Sister   . Hypertension Sister   . Hyperlipidemia Sister   . Breast cancer Maternal Grandmother    Scheduled Meds: . apixaban  5 mg Oral BID  . calcitRIOL  0.25 mcg Oral Q M,W,F-HD  .  Chlorhexidine Gluconate Cloth  6 each Topical Q0600  . feeding supplement (NEPRO CARB STEADY)  237 mL Oral BID BM  . feeding supplement (PRO-STAT SUGAR FREE 64)  30 mL Oral TID WC  . ferric citrate  420 mg Oral TID WC  . insulin aspart  0-9 Units Subcutaneous TID WC  . Melatonin  3 mg Oral QHS  . metoprolol succinate  50 mg Oral Daily  . multivitamin  1 tablet Oral QHS  . omega-3 acid ethyl esters  1,000 mg Oral Daily  . pantoprazole  40 mg Oral Daily  . PARoxetine  40 mg Oral Q breakfast  . polyvinyl alcohol  1 drop Both Eyes TID  . potassium chloride  30 mEq Oral BID  . rosuvastatin  20 mg Oral Daily  . sodium chloride flush  3 mL Intravenous Once  . vancomycin  125 mg Oral QID  . vitamin C  1,000 mg Oral Daily   Continuous Infusions: PRN Meds:.acetaminophen **OR** acetaminophen, lidocaine-prilocaine, nitroGLYCERIN, promethazine Medications Prior to Admission:  Prior to Admission medications   Medication Sig Start Date End Date Taking? Authorizing Provider  acetaminophen (TYLENOL) 500 MG tablet Take 1,000 mg by mouth every 6 (six) hours as needed for moderate pain or headache.   Yes [provider]  allopurinol (ZYLOPRIM) 100 MG tablet Take 0.5 tablets (50 mg total) by mouth daily. 07/11/18  Yes Hensel, Jamal Collin, MD  apixaban (ELIQUIS) 5 MG TABS tablet Take 1 tablet (5 mg total) by mouth 2 (two) times daily. 07/11/18  Yes Hensel, Jamal Collin, MD  Ascorbic Acid (VITAMIN C) 1000 MG tablet Take 1,000 mg by mouth daily.   Yes [provider]  calcitRIOL (ROCALTROL) 0.25 MCG capsule Take 1 capsule (0.25 mcg total) by mouth every Monday, Wednesday, and Friday with hemodialysis. 07/11/18  Yes Hensel, Jamal Collin, MD  carboxymethylcellulose (REFRESH PLUS) 0.5 % SOLN Place 1 drop into both eyes 3 (three) times daily.    Yes [provider]  ferric citrate (AURYXIA) 1 GM 210 MG(Fe) tablet Take 2 tablets (420 mg total) by mouth 3 (three) times daily with meals. 06/14/18  Yes  Meccariello, Bernita Raisin, DO  GNP GARLIC EXTRACT PO Take 1 tablet by mouth daily.    Yes [provider]  HUMALOG 100 UNIT/ML injection Inject 5 Units into the skin 3 (three) times daily before meals. 07/02/18  Yes [provider]  Insulin Glargine (LANTUS SOLOSTAR) 100 UNIT/ML Solostar Pen Inject 12 Units into the skin daily. 07/11/18  Yes Hensel, Jamal Collin, MD  lidocaine-prilocaine (EMLA) cream Apply 1 application topically See admin instructions. APPLY SMALL AMOUNT TO ACCESS SITE (AVF) 1 TO 2  HOURS BEFORE DIALYSIS. COVER WITH OCCLUSIVE DRESSING (SARAN WRAP) 06/18/18  Yes [provider]  Melatonin 10 MG CAPS Take 10 mg by mouth at bedtime.    Yes [provider]  metoprolol succinate (TOPROL-XL) 25 MG 24 hr tablet Take 1 tablet (25 mg total) by mouth daily. 07/11/18  Yes Hensel, Jamal Collin, MD  Multiple Vitamins-Minerals (EYE VITAMINS PO) Take 1 capsule by mouth 2 (two) times daily. Taking one daily from her opthamologist (Dr. Herbert Deaner)    Yes [provider]  Multiple Vitamins-Minerals (MULTIVITAMINS THER. W/MINERALS) TABS Take 1 tablet by mouth daily.     Yes [provider]  nitroGLYCERIN (NITROSTAT) 0.4 MG SL tablet Place 1 tablet (0.4 mg total) under the tongue every 5 (five) minutes as needed for chest pain. 07/10/18  Yes [name], MD  Omega-3 Fatty Acids (FISH OIL) 1000 MG CAPS Take 1,000 mg by mouth daily.   Yes [provider]  pantoprazole (PROTONIX) 40 MG tablet Take 1 tablet (40 mg total) by mouth daily. 07/11/18  Yes Hensel, Jamal Collin, MD  PARoxetine (PAXIL) 30 MG tablet Take 30 mg by mouth daily. 07/02/18  Yes [provider]  rosuvastatin (CRESTOR) 20 MG tablet TAKE 1 TABLET(20 MG) BY MOUTH DAILY 07/11/18  Yes Hensel, Jamal Collin, MD  ACCU-CHEK AVIVA PLUS test strip TEST THREE TIMES A DAY 04/26/17   Christine Resides, MD  fidaxomicin (DIFICID) 200 MG TABS tablet Take 1 tablet (200 mg total) by mouth 2 (two) times daily  for 10 days. 07/19/18 07/29/18  Gold[place] Circle, FNP  insulin lispro (HUMALOG KWIKPEN) 100 UNIT/ML KwikPen INJECT 5 UNITS UNDER THE SKIN THREE TIMES DAILY BEFORE MEALS Patient not taking: Reported on 07/18/2018 07/11/18   Christine Resides, MD  LANTUS 100 UNIT/ML injection Inject 5 Units into the skin at bedtime. 07/02/18   [provider]  PARoxetine (PAXIL) 40 MG tablet Take 1 tablet (40 mg total) by mouth every morning. Patient not taking: Reported on 07/18/2018 07/11/18   Christine Resides, MD   Allergies  Allergen Reactions  . Cefepime Anaphylaxis    Throat swelling  . Amoxicillin Rash    Did it involve swelling of the face/tongue/throat, SOB, or low BP? No Did it involve sudden or severe rash/hives, skin peeling, or any reaction on the inside of your mouth or nose? No Did you need to seek medical attention at a hospital or doctor's office? No When did it last happen?10+ years If all above answers are "NO", may proceed with cephalosporin use.    . Codeine Phosphate Rash  . Penicillins Rash    Did it involve swelling of the face/tongue/throat, SOB, or low BP? No Did it involve sudden or severe rash/hives, skin peeling, or any reaction on the inside of your mouth or nose? No Did you need to seek medical attention at a hospital or doctor's office? No When did it last happen?10+ years If all above answers are "NO", may proceed with cephalosporin use.     Marland Kitchen Propoxyphene Rash   Review of Systems  Unable to perform ROS: Acuity of condition    Physical Exam Vitals signs and nursing note reviewed.  Constitutional:      Appearance: She is ill-appearing.  HENT:     Head: Normocephalic and atraumatic.     Nose: Nose normal.  Neck:     Musculoskeletal: Normal range of motion.  Cardiovascular:     Rate and Rhythm: Tachycardia present.  Pulses: Normal pulses.  Pulmonary:     Comments: Weak respiratory effort Abdominal:     General: There is distension.      Tenderness: There is abdominal tenderness.  Genitourinary:    Comments: Rectal tube Skin:    General: Skin is warm and dry.     Comments: Right lower extremity skin discoloration, questionable cellulitis  Neurological:     Motor: Weakness present.     Comments: 3-week  Psychiatric:     Comments: Affect blunted mood depressed     Vital Signs: BP 131/60   Pulse (!) 118   Temp 98.2 F (36.8 C) (Oral)   Resp 16   Ht 5\' 5"  (1.651 m)   Wt 69.3 kg   SpO2 94%   BMI 25.42 kg/m  Pain Scale: 0-10 POSS *See Group Information*: 1-Acceptable,Awake and alert Pain Score: 0-No pain   SpO2: SpO2: 94 % O2 Device:SpO2: 94 % O2 Flow Rate: .O2 Flow Rate (L/min): 2 L/min  IO: Intake/output summary: No intake or output data in the 24 hours ending 07/20/18 1020  LBM: Last BM Date: 07/20/18 Baseline Weight: Weight: 71.6 kg Most recent weight: Weight: 69.3 kg     Palliative Assessment/Data:   Flowsheet Rows     Most Recent Value  Intake Tab  Referral Department  Hospitalist  Unit at Time of Referral  Med/Surg Unit  Palliative Care Primary Diagnosis  Sepsis/Infectious Disease  Date Notified  07/19/18  Palliative Care Type  New Palliative care  Reason for referral  Clarify Goals of Care, Psychosocial or Spiritual support  Date of Admission  07/17/18  Date first seen by Palliative Care  07/20/18  # of days Palliative referral response time  1 Day(s)  # of days IP prior to Palliative referral  2  Clinical Assessment  Palliative Performance Scale Score  40%  Pain Max last 24 hours  Not able to report [just hurts all the time]  Pain Min Last 24 hours  Not able to report  Dyspnea Max Last 24 Hours  Not able to report  Dyspnea Min Last 24 hours  Not able to report  Nausea Max Last 24 Hours  Not able to report  Nausea Min Last 24 Hours  Not able to report  Anxiety Max Last 24 Hours  Not able to report  Anxiety Min Last 24 Hours  Not able to report  Other Max Last 24 Hours  Not able to  report  Psychosocial & Spiritual Assessment  Palliative Care Outcomes  Patient/Family meeting held?  Yes  Who was at the meeting?  pt  Palliative Care Outcomes  Linked to palliative care logitudinal support, Provided psychosocial or spiritual support, Improved pain interventions  Palliative Care follow-up planned  Yes, Facility      Time In: 0930 Time Out: 1025 Time Total: 55 min Greater than 50%  of this time was spent counseling and coordinating care related to the above assessment and plan.Staffed with Dr. Andria Santos, and resident  Signed by: Dory Horn, NP   Please contact Palliative Medicine Team phone at [phone] for questions and concerns.  For individual provider: See Shea Evans

## 2018-07-20 NOTE — Progress Notes (Addendum)
[facility] Progress Note   Dialysis Orders: [facility] M[facility]   4h  350/600  69.5kg  2/2 bath  P4  AVG  Hep 2400 +2047midrun -Venofer 50 mg q week -Mircera 150 mcg IV q 2 weeks (last dose 6/17) -Calcitriol 0.25 TIW  Assessment/Plan: 1. C. Diff colitis- WBCs ^. Flexi-Seal placed. On PO Vanc -per primary 2. ESRD -MWF. Next HD today Use 4K bath K 3.1 today - repeat labs Sat - dose K today ordered today 30 x 2 3. Hypertension/volume - BP stable.  at EDW. Net UF 64mL with HD Wed UF as tolerated - received BB this am due to tachycardia may limit UF 4. Anemia - hgb 11.5 No ESA needs currentlyESA due 7/1 5. Metabolic bone disease -Continue VDRA/binders 6. Nutrition -Prot supp for low albumin  7. AFib s/p ICD  - on metoprolol /Eliquis  8. Hx CVA 9. DM   Myriam Jacobson, PA-C [facility] [phone] 07/20/2018,9:58 AM  LOS: 2 days   Pt seen, examined and agree w A/P as above.  Kelly Splinter  MD 07/20/2018, 11:33 AM    Subjective:   No appetite - didn't eat breakfast  Objective Vitals:   07/19/18 1333 07/19/18 2104 07/20/18 0539 07/20/18 0832  BP: 92/67 97/80 128/61 131/60  Pulse: (!) 107 72 (!) 105 (!) 118  Resp: 16     Temp:  98.9 F (37.2 C) 98.2 F (36.8 C)   TempSrc:  Oral Oral   SpO2: 93% 99% 94%   Weight:      Height:       Physical Exam General: ill appearing female no SOB supine in bed Heart: irreg irreg Lungs: grossly clear  Abdomen: soft generalized tenderness Extremities: tr LE edema Dialysis Access:  Left upper AVGG + bruit   Additional Objective Labs: Basic Metabolic Panel: Recent Labs  Lab 07/18/18 0305 07/19/18 0228 07/20/18 0156  NA 135 137 133*  K 4.3 3.8 3.1*  CL 91* 94* 92*  CO2 24 24 25   GLUCOSE 174* 160* 158*  BUN 70* 41* 64*  CREATININE 9.91* 6.54* 7.34*  CALCIUM 7.7* 7.9* 7.7*   Liver Function Tests: Recent Labs  Lab 07/17/18 1241  AST 21  ALT 22  ALKPHOS 187*  BILITOT 1.2  PROT  5.0*  ALBUMIN 2.5*   Recent Labs  Lab 07/17/18 1241  LIPASE 17   CBC: Recent Labs  Lab 07/17/18 1241 07/18/18 0305 07/18/18 1241 07/19/18 0228 07/20/18 0156  WBC 24.7* 24.9*  --  25.8* 21.3*  HGB 11.7* 11.6*  --  11.6* 11.5*  HCT 37.5 36.2 35.9 37.3 36.0  MCV 103.6* 100.3*  --  101.4* 100.0  PLT 218 202  --  186 158   Blood Culture    Component Value Date/Time   SDES BLOOD RIGHT ANTECUBITAL 10/29/2017 1125   SDES BLOOD RIGHT ANTECUBITAL 10/29/2017 1125   SPECREQUEST  10/29/2017 1125    BOTTLES DRAWN AEROBIC AND ANAEROBIC Blood Culture adequate volume   SPECREQUEST  10/29/2017 1125    BOTTLES DRAWN AEROBIC AND ANAEROBIC Blood Culture adequate volume   CULT  10/29/2017 1125    NO GROWTH 5 DAYS Performed at [facility], [place] [address], [place], [place] [address]    CULT  10/29/2017 1125    NO GROWTH 5 DAYS Performed at [facility], [place] [address], [place], [place] [address]    REPTSTATUS 11/03/2017 FINAL 10/29/2017 1125   REPTSTATUS 11/03/2017 FINAL 10/29/2017 1125  Cardiac Enzymes: No results for input(s): CKTOTAL, CKMB, CKMBINDEX, TROPONINI in the last 168 hours. CBG: Recent Labs  Lab 07/19/18 0754 07/19/18 1209 07/19/18 1646 07/19/18 2106 07/20/18 0756  GLUCAP 158* 186* 161* 166* 185*   Iron Studies: No results for input(s): IRON, TIBC, TRANSFERRIN, FERRITIN in the last 72 hours. Lab Results  Component Value Date   INR 1.2 06/09/2018   INR 1.1 06/07/2018   INR 1.1 06/06/2018   Studies/Results: No results found. Medications:  . apixaban  5 mg Oral BID  . calcitRIOL  0.25 mcg Oral Q M,W,F-HD  . Chlorhexidine Gluconate Cloth  6 each Topical Q0600  . feeding supplement (NEPRO CARB STEADY)  237 mL Oral BID BM  . feeding supplement (PRO-STAT SUGAR FREE 64)  30 mL Oral TID WC  . ferric citrate  420 mg Oral TID WC  . insulin aspart  0-9 Units Subcutaneous TID WC  . Melatonin  3 mg Oral QHS  . metoprolol succinate  50 mg Oral Daily  .  multivitamin  1 tablet Oral QHS  . omega-3 acid ethyl esters  1,000 mg Oral Daily  . pantoprazole  40 mg Oral Daily  . PARoxetine  40 mg Oral Q breakfast  . polyvinyl alcohol  1 drop Both Eyes TID  . potassium chloride  30 mEq Oral BID  . rosuvastatin  20 mg Oral Daily  . sodium chloride flush  3 mL Intravenous Once  . vancomycin  125 mg Oral QID  . vitamin C  1,000 mg Oral Daily

## 2018-07-20 NOTE — Progress Notes (Signed)
Family Medicine Teaching Service Daily Progress Note Intern Pager: [phone]  Patient name: Christine Santos Medical record number: [id number] Date of birth: 1945-08-05 Age: 73 y.o. Gender: female  Primary Care Provider: [name], MD Consultants: Nephro Code Status: DNR  Pt Overview and Major Events to Date:  6/23-admission for C. difficile Santos  Assessment and Plan: Christine Santos is a 73 y.o. female presenting with 3 days of nausea, vomiting, diarrhea.  Previous medical history is significant for episodes of C. difficile Santos, ESRD, heart failure with preserved ejection fraction, CAD, diabetes, GERD.  C. difficile Santos Afebrile overnight with vitals remarkable for continued mild tachycardia up to 122.  WBC remains elevated: 24.7>24.9(6/24)>25.8(6/25)>21.3(6/26).  ID consulted on 6/25 regarding the possibility of FMT. -Potential F MT outpatient following resolution of current episode -Vancomycin p.o. 125 mg 4 times daily with the following taper: ---125 mg orally 4 times daily for 10 days (6/23-7/2), followed by ---125 mg orally twice daily for 7 days (7/3-7/9), followed by ---125 mg orally once daily for 7 days (7/10-7/6), followed by  ---125 mg orally every 2 days for 6 weeks -Flexi-Seal placed -OT - SNF -PT - SNF  Memory loss Moca performed by SLP on 6/25 showing a score of 12/30.  Consistent with moderate impairment.  She has no known acute changes to her mental status.  This is likely secondary to chronic decline in cognitive function.    Hypokalemia Potassium 3.1 the morning of 6/26. -Replete PRN  ESRD (MWF) Dialysis session 6/24.  Electrolytes vastly improved. -Nephrology aware -Dialysis MWF  Atrial fibrillation s/p ICD, chronic Mild improvement in tachycardia compared to admission.  Tachycardia up to 122 although primarily below 110 in the past 24 hours. -Eliquis 5 mg daily -Metoprolol succinate 25 mg daily  Diabetes mellitus Blood sugar 139-232 in  the past 24 hours 5 units aspart administered in the past 24 hours. -Continue insulin sliding scale -Consider beginning Lantus as she resumes her normal appetite  Heart failure with preserved ejection fraction, chronic Last echo (08/2015) shows EF of 55-60% with grade 1 diastolic dysfunction.   -Metoprolol as above  CAD  -Rosuvastatin 20 mg -Nitroglycerin SL as needed  GERD -Protonix 40 mg  Anxiety -Paxil 40 mg  Anemia, macrocytic Hemoglobin 11.7 on admission.  MCV 103.  B12 and folate normal. -Monitor CBC  FEN/GI: Renal diet PPx: Heparin SQ  Disposition: 2-4 days of hospitalization anticipated prior to discharge  Subjective:  No acute events overnight.  She reports that she is mildly subjectively improved compared to yesterday.  She notes continued pain in her side/hips.  No stomach pain or nausea at this time.  Objective: Temp:  [98.2 F (36.8 C)-98.9 F (37.2 C)] 98.2 F (36.8 C) (06/26 0539) Pulse Rate:  [72-107] 105 (06/26 0539) Resp:  [16] 16 (06/25 1333) BP: (92-128)/(61-80) 128/61 (06/26 0539) SpO2:  [93 %-99 %] 94 % (06/26 0539) Physical Exam: General: Alert and cooperative.  No acute distress.  Lying in bed clearly low energy.  Mild subjective improvement compared to 6/25. Cardio: Normal S1 and S2, no S3 or S4. Rhythm is irregularly irregular. No murmurs or rubs.   Pulm: Clear to auscultation bilaterally, no crackles, wheezing, or diminished breath sounds. Normal respiratory effort Abdomen: Bowel sounds normal.  Soft, nontender to palpation. Extremities: No peripheral edema. Warm/ well perfused.  Strong radial pulses. Neuro: Cranial nerves grossly intact   Laboratory: Recent Labs  Lab 07/18/18 0305 07/18/18 1241 07/19/18 0228 07/20/18 0156  WBC 24.9*  --  25.8* 21.3*  HGB 11.6*  --  11.6* 11.5*  HCT 36.2 35.9 37.3 36.0  PLT 202  --  186 158   Recent Labs  Lab 07/17/18 1241 07/18/18 0305 07/19/18 0228 07/20/18 0156  NA 135 135 137 133*  K 4.8  4.3 3.8 3.1*  CL 92* 91* 94* 92*  CO2 24 24 24 25   BUN 60* 70* 41* 64*  CREATININE 10.45* 9.91* 6.54* 7.34*  CALCIUM 8.6* 7.7* 7.9* 7.7*  PROT 5.0*  --   --   --   BILITOT 1.2  --   --   --   ALKPHOS 187*  --   --   --   ALT 22  --   --   --   AST 21  --   --   --   GLUCOSE 210* 174* 160* 158*    Imaging/Diagnostic Tests: No results found.   Matilde Haymaker, MD 07/20/2018, 6:21 AM PGY-1, [place] Intern pager: [phone], text pages welcome

## 2018-07-20 NOTE — TOC Progression Note (Addendum)
Transition of Care [facility]) - Progression Note    Patient Details  Name: Christine Santos MRN: [id number] Date of Birth: [birth year]  Transition of Care [facility]) CM/SW [place], LCSW Phone Number: 07/20/2018, 3:18 PM  Clinical Narrative:    CSW spoke with patient regarding SNF. She is very tearful and states that she would like to return home. CSW asked permission to contact her significant other and her sister. Patient granted consent. Per patient's sister, she reports that patient's significant needs help at home but will likely be adamant about patient returning home. Per Abe People, patient's significant other, patient has just completed rehab at [facility] after her hip surgery (5/21-6/8) and that patient will be in copay days at [facility]. They set her up with [facility] at discharge. She has all equipment at home. He states that he will leave the decision up to the patient on whether she wants to return home or to SNF. CSW will follow up.   Update: Patient now in agreement to send out SNF referral.    Expected Discharge Plan: Skilled Nursing Facility Barriers to Discharge: Insurance Authorization  Expected Discharge Plan and Services Expected Discharge Plan: [place] In-house Referral: Clinical Social Work Discharge Planning Services: NA Post Acute Care Choice: [place] Living arrangements for the past 2 months: [place], [place] Expected Discharge Date: 07/21/18                         [facility] Arranged: NA           Social Determinants of Health (SDOH) Interventions    Readmission Risk Interventions Readmission Risk Prevention Plan 06/12/2018  Transportation Screening Complete  Medication Review Press photographer) Complete  PCP or Specialist appointment within 3-5 days of discharge Complete  HRI or Home Care Consult Complete  SW Recovery Care/Counseling Consult Complete  [place] Complete  Some recent data might be hidden

## 2018-07-20 NOTE — Progress Notes (Signed)
Updated patient's sister, [name], on patient's status and plan of care x 2 today.   Hiram Comber, RN 07/20/2018 3:18 PM

## 2018-07-21 ENCOUNTER — Inpatient Hospital Stay (HOSPITAL_COMMUNITY): Payer: Medicare Other

## 2018-07-21 DIAGNOSIS — Z7189 Other specified counseling: Secondary | ICD-10-CM

## 2018-07-21 DIAGNOSIS — R1084 Generalized abdominal pain: Secondary | ICD-10-CM

## 2018-07-21 LAB — BASIC METABOLIC PANEL
Anion gap: 18 — ABNORMAL HIGH (ref 5–15)
BUN: 36 mg/dL — ABNORMAL HIGH (ref 8–23)
CO2: 23 mmol/L (ref 22–32)
Calcium: 8 mg/dL — ABNORMAL LOW (ref 8.9–10.3)
Chloride: 98 mmol/L (ref 98–111)
Creatinine, Ser: 5.19 mg/dL — ABNORMAL HIGH (ref 0.44–1.00)
GFR calc Af Amer: 9 mL/min — ABNORMAL LOW (ref >60)
GFR calc non Af Amer: 8 mL/min — ABNORMAL LOW (ref >60)
Glucose, Bld: 161 mg/dL — ABNORMAL HIGH (ref 70–99)
Potassium: 4.2 mmol/L (ref 3.5–5.1)
Sodium: 139 mmol/L (ref 135–145)

## 2018-07-21 LAB — GLUCOSE, CAPILLARY
Glucose-Capillary: 155 mg/dL — ABNORMAL HIGH (ref 70–99)
Glucose-Capillary: 149 mg/dL — ABNORMAL HIGH (ref 70–99)
Glucose-Capillary: 207 mg/dL — ABNORMAL HIGH (ref 70–99)
Glucose-Capillary: 162 mg/dL — ABNORMAL HIGH (ref 70–99)

## 2018-07-21 LAB — CBC
HCT: 40.7 % (ref 36.0–46.0)
Hemoglobin: 12.9 g/dL (ref 12.0–15.0)
MCH: 32 pg (ref 26.0–34.0)
MCHC: 31.7 g/dL (ref 30.0–36.0)
MCV: 101 fL — ABNORMAL HIGH (ref 80.0–100.0)
Platelets: 135 10*3/uL — ABNORMAL LOW (ref 150–400)
RBC: 4.03 MIL/uL (ref 3.87–5.11)
RDW: 19.1 % — ABNORMAL HIGH (ref 11.5–15.5)
WBC: 22 10*3/uL — ABNORMAL HIGH (ref 4.0–10.5)
nRBC: 0.4 % — ABNORMAL HIGH (ref 0.0–0.2)

## 2018-07-21 LAB — HEPATITIS B SURFACE ANTIGEN: Hepatitis B Surface Ag: NEGATIVE

## 2018-07-21 MED ORDER — SODIUM CHLORIDE 0.9 % IV BOLUS
250.0000 mL | Freq: Once | INTRAVENOUS | Status: AC
  Administered 2018-07-21: 12:00:00 250 mL via INTRAVENOUS

## 2018-07-21 MED ORDER — OXYCODONE HCL 5 MG/5ML PO SOLN
5.0000 mg | Freq: Four times a day (QID) | ORAL | Status: DC
  Administered 2018-07-21 – 2018-07-22 (×4): 5 mg via ORAL
  Filled 2018-07-21 (×5): qty 5

## 2018-07-21 NOTE — Progress Notes (Signed)
Patient experiencing a difficult time keeping eyes open. When awake, able to answer orientation questions x 4. However, experiencing abnormal jerking movements and attempting to grab the air. Patient does not answer whenever she asks what exactly she is grabbing.

## 2018-07-21 NOTE — Progress Notes (Signed)
Following administration of scheduled oxycodone @ 1227, patient more lethargic and difficult to arouse. When alert, patient denies any further pain. Will continue to monitor.  Hiram Comber, RN 07/21/2018 2:20 PM

## 2018-07-21 NOTE — Progress Notes (Addendum)
Family Medicine Teaching Service Daily Progress Note Intern Pager: [phone]  Patient name: Christine Santos Medical record number: [id number] Date of birth: 1946/01/08 Age: 73 y.o. Gender: female  Primary Care Provider: [name], MD Consultants: Nephro Code Status: DNR  Pt Overview and Major Events to Date:  6/23-admission for C. difficile colitis  Assessment and Plan: Christine Santos is a 73 y.o. female presenting with 3 days of nausea, vomiting, diarrhea.  Previous medical history is significant for episodes of C. difficile colitis, ESRD, heart failure with preserved ejection fraction, CAD, diabetes, GERD.  C. difficile colitis WBC remains elevated: 24.7>24.9>25.8>21.3>22(6/27).  Afebrile overnight with mildly elevated heart rate secondary to A. fib.  Per ID note from 6/26, continue current treatment, ID signing off with plans to follow-up in clinic.  She appears more weary this morning and less engaged in our conversation.  She reports continued hip pain.  She denies stomach pain this morning. -Potential FMT outpatient following resolution of current episode -Vancomycin p.o. 125 mg 4 times daily with the following taper: ---125 mg orally 4 times daily for 10 days (6/23-7/2), followed by ---125 mg orally twice daily for 7 days (7/3-7/9), followed by ---125 mg orally once daily for 7 days (7/10-7/6), followed by  ---125 mg orally every 2 days for 6 weeks -Flexi-Seal placed -250 cc bolus NS -OT - SNF -PT - SNF  Concern for additional source of infection Due to her subjective worsening, there is concern that she may have an additional source of infection not currently being treated.  Will work-up and manage appropriately. -Lactic acid -Chest x-ray  Memory loss Moca performed by SLP on 6/25 showing a score of 12/30.  Consistent with moderate impairment.  She has no known acute changes to her mental status.  This is likely secondary to chronic decline in cognitive function.     Goals of care Patient is partial code (DNR, do intubate).  She would qualify for hospice if she were interested.  She has not elected to move to hospice care at this time.  She is like to move forward with a full scope of care. -We will continue to discuss goals of care with patient as appropriate  Hypokalemia-improved -Monitor BMP -Replete PRN  ESRD (MWF) Dialysis session 6/24.  Electrolytes vastly improved. -Nephrology aware -Dialysis MWF  Thrombocytopenia Platelets 135,000 this morning.  Down from 158,000 on 6/26.  No clear signs of bleeding on exam.  We will continue to monitor labs for now. -Monitor CBC  Atrial fibrillation s/p ICD, chronic Mild improvement in tachycardia compared to admission.  Tachycardia up to 122 although primarily below 110 in the past 24 hours. -Eliquis 5 mg daily -Metoprolol succinate 25 mg daily  Diabetes mellitus Blood sugar 139-232 in the past 24 hours 5 units aspart administered in the past 24 hours. -Continue insulin sliding scale -Consider beginning Lantus as she resumes her normal appetite  Heart failure with preserved ejection fraction, chronic Last echo (08/2015) shows EF of 55-60% with grade 1 diastolic dysfunction.   -Metoprolol as above  CAD  -Rosuvastatin 20 mg -Nitroglycerin SL as needed  GERD -Protonix 40 mg  Anxiety -Paxil 40 mg  Anemia, macrocytic Hemoglobin 11.7 on admission.  MCV 103.  B12 and folate normal. -Monitor CBC  FEN/GI: Renal diet PPx: Heparin SQ  Disposition: 2-4 days of hospitalization anticipated prior to discharge  Subjective:  No acute events overnight.  No new complaints this morning.  Continued mentioning of her hip pain.  No stomach  pain this morning.  Overall, she.  [place] Hummingbird and low energy this morning.  Subjective worsening compared to 6/26.  Objective: Temp:  [97.3 F (36.3 C)-98.2 F (36.8 C)] 98.1 F (36.7 C) (06/27 0628) Pulse Rate:  [71-119] 119 (06/27 0628) Resp:  [12-20] 20 (06/26  1955) BP: (95-160)/(50-100) 127/77 (06/27 0628) SpO2:  [81 %-100 %] 100 % (06/27 0628) Weight:  [69.4 kg-70.2 kg] 69.4 kg (06/26 1955)  Physical Exam: General: Alert and cooperative and appears to be in no acute distress.  Low energy, fatigue this morning.  Subjective worsening (ill appearance) compared to 6/26. Cardio: Normal S1 and S2, no S3 or S4. irregularly irregular rhythm. No murmurs or rubs.   Pulm: Clear to auscultation bilaterally, no crackles, wheezing, or diminished breath sounds. Normal respiratory effort Abdomen: Bowel sounds normal. Abdomen soft and non-tender.  Extremities: 2+ LE edema in lower extremities.  Difficult to palpate radial pulses bilaterally. Neuro: Cranial nerves grossly intact    Laboratory: Recent Labs  Lab 07/19/18 0228 07/20/18 0156 07/21/18 0225  WBC 25.8* 21.3* 22.0*  HGB 11.6* 11.5* 12.9  HCT 37.3 36.0 40.7  PLT 186 158 135*   Recent Labs  Lab 07/17/18 1241  07/19/18 0228 07/20/18 0156 07/21/18 0225  NA 135   < > 137 133* 139  K 4.8   < > 3.8 3.1* 4.2  CL 92*   < > 94* 92* 98  CO2 24   < > 24 25 23   BUN 60*   < > 41* 64* 36*  CREATININE 10.45*   < > 6.54* 7.34* 5.19*  CALCIUM 8.6*   < > 7.9* 7.7* 8.0*  PROT 5.0*  --   --   --   --   BILITOT 1.2  --   --   --   --   ALKPHOS 187*  --   --   --   --   ALT 22  --   --   --   --   AST 21  --   --   --   --   GLUCOSE 210*   < > 160* 158* 161*   < > = values in this interval not displayed.    Imaging/Diagnostic Tests: No results found.   Matilde Haymaker, MD 07/21/2018, 8:54 AM PGY-1, [place] Intern pager: [phone], text pages welcome

## 2018-07-21 NOTE — Progress Notes (Addendum)
[facility] Progress Note   Dialysis Orders: [place] MWF   4h  350/600  69.5kg  2/2 bath  P4  AVG  Hep 2400 +2070midrun -Venofer 50 mg q week -Mircera 150 mcg IV q 2 weeks (last dose 6/17) -Calcitriol 0.25 TIW  Assessment/Plan: 1. C. Diff colitis- WBCs ^. Flexi-Seal placed. On PO Vanc taper -per primary. Afebrile ^ WBC.  2. ESRD -MWF. K 4.2 better today - next HD Monday 3. Hypertension/volume - BP stable.  Net UF 1 L Friday > 69.4  4. Anemia - hgb 12.9 .5 No ESA needs currentlyESA due 7/1 5. Metabolic bone disease -Continue VDRA/binders 6. Nutrition -Prot supp for low albumin - minimal input recorded the past few days - spoke with RN - will need assistance with meals 7. AFib s/p ICD  - on metoprolol /Eliquis HR remains elevated, has ICD  8. Hx CVA 9. DM  10. Goals of care - partial code while here but plan for discharge likely full code - appreciate palliative care consult  11. Disp - from [employer] - likely to return there 12. Thrombocytopenia - 130s - follow  13.  Anxiety -   Myriam Jacobson, PA-C [facility] Beeper [phone] 07/21/2018,9:54 AM  LOS: 3 days    Pt seen, examined and agree w A/P as above.  Kelly Splinter  MD 07/21/2018, 1:54 PM   Subjective:   Some SOB at rest. Feels bad. Thirsty. Waiting for breakfast. RN said she can't hold onto things without dropping. Gave some water and a few sips of nepro while waiting for tray. Asked RN to recheck VS.  Objective Vitals:   07/20/18 1955 07/20/18 2115 07/21/18 0628 07/21/18 0905  BP: 115/90 (!) 106/54 127/77 133/68  Pulse: (!) 109 71 (!) 119 (!) 118  Resp: 20     Temp: 98 F (36.7 C) (!) 97.3 F (36.3 C) 98.1 F (36.7 C) 98.2 F (36.8 C)  TempSrc: Oral Oral Oral Oral  SpO2: 100% (!) 81% 100%   Weight: 69.4 kg     Height:       Physical Exam General: ill appearing female sitting in recliner -speech clear , starring oddly at times Heart: irreg irreg tachy Lungs:  grossly clear  Abdomen: soft generalized tenderness distended Extremities: tr LE edema Dialysis Access:  Left upper AVGG + bruit Neuro: occ myoclonic jerks noted   Additional Objective Labs: Basic Metabolic Panel: Recent Labs  Lab 07/19/18 0228 07/20/18 0156 07/21/18 0225  NA 137 133* 139  K 3.8 3.1* 4.2  CL 94* 92* 98  CO2 24 25 23   GLUCOSE 160* 158* 161*  BUN 41* 64* 36*  CREATININE 6.54* 7.34* 5.19*  CALCIUM 7.9* 7.7* 8.0*   Liver Function Tests: Recent Labs  Lab 07/17/18 1241  AST 21  ALT 22  ALKPHOS 187*  BILITOT 1.2  PROT 5.0*  ALBUMIN 2.5*   Recent Labs  Lab 07/17/18 1241  LIPASE 17   CBC: Recent Labs  Lab 07/17/18 1241 07/18/18 0305  07/19/18 0228 07/20/18 0156 07/21/18 0225  WBC 24.7* 24.9*  --  25.8* 21.3* 22.0*  HGB 11.7* 11.6*  --  11.6* 11.5* 12.9  HCT 37.5 36.2   < > 37.3 36.0 40.7  MCV 103.6* 100.3*  --  101.4* 100.0 101.0*  PLT 218 202  --  186 158 135*   < > = values in this interval not displayed.   Blood Culture    Component Value Date/Time   SDES BLOOD  RIGHT ANTECUBITAL 10/29/2017 1125   SDES BLOOD RIGHT ANTECUBITAL 10/29/2017 1125   SPECREQUEST  10/29/2017 1125    BOTTLES DRAWN AEROBIC AND ANAEROBIC Blood Culture adequate volume   SPECREQUEST  10/29/2017 1125    BOTTLES DRAWN AEROBIC AND ANAEROBIC Blood Culture adequate volume   CULT  10/29/2017 1125    NO GROWTH 5 DAYS Performed at [facility], [place] [address] [place], [place] [address]    CULT  10/29/2017 1125    NO GROWTH 5 DAYS Performed at [facility], [place] [address] [place], [place] [address]    REPTSTATUS 11/03/2017 FINAL 10/29/2017 1125   REPTSTATUS 11/03/2017 FINAL 10/29/2017 1125    Cardiac Enzymes: No results for input(s): CKTOTAL, CKMB, CKMBINDEX, TROPONINI in the last 168 hours. CBG: Recent Labs  Lab 07/19/18 2106 07/20/18 0756 07/20/18 1142 07/20/18 2058 07/21/18 0748  GLUCAP 166* 185* 119* 107* 155*   Iron Studies: No results for  input(s): IRON, TIBC, TRANSFERRIN, FERRITIN in the last 72 hours. Lab Results  Component Value Date   INR 1.2 06/09/2018   INR 1.1 06/07/2018   INR 1.1 06/06/2018   Studies/Results: No results found. Medications: . sodium chloride    . sodium chloride     . apixaban  5 mg Oral BID  . calcitRIOL  0.25 mcg Oral Q M,W,F-HD  . Chlorhexidine Gluconate Cloth  6 each Topical Q0600  . famotidine  10 mg Oral Daily  . feeding supplement (NEPRO CARB STEADY)  237 mL Oral BID BM  . feeding supplement (PRO-STAT SUGAR FREE 64)  30 mL Oral TID WC  . ferric citrate  420 mg Oral TID WC  . insulin aspart  0-9 Units Subcutaneous TID WC  . Melatonin  3 mg Oral QHS  . metoprolol succinate  50 mg Oral Daily  . multivitamin  1 tablet Oral QHS  . nystatin  5 mL Oral QID  . omega-3 acid ethyl esters  1,000 mg Oral Daily  . PARoxetine  40 mg Oral Q breakfast  . polyvinyl alcohol  1 drop Both Eyes TID  . rosuvastatin  20 mg Oral Daily  . sodium chloride flush  3 mL Intravenous Once  . vancomycin  125 mg Oral QID   Followed by  . [START ON 07/31/2018] vancomycin  125 mg Oral BID   Followed by  . [START ON 08/08/2018] vancomycin  125 mg Oral Daily   Followed by  . [START ON 08/15/2018] vancomycin  125 mg Oral QODAY   Followed by  . [START ON 09/12/2018] vancomycin  125 mg Oral Q3 days  . vitamin C  1,000 mg Oral Daily

## 2018-07-21 NOTE — Progress Notes (Signed)
Palliative medicine progress note  Patient seen, chart reviewed.  Patient's clinical condition appears to have declined overnight: She is less alert, continued leukocytosis as well as a period of shortness of breath.  She is also not eating anything thus far today stating "I cannot swallow".  She has been started on nystatin for probable thrush.  She was very difficult to wake up today.  I did get her awake enough to ask her if she was still having abdominal pain and she replied no".  She was given oxycodone approximately an hour and a half before I went to see her.  Per her nurse, patient has been more lethargic today.  She was heard talking on the phone this morning but we are not seeing that this afternoon  Primary team have ordered oxycodone as well as nystatin for thrush.  Also see that chest x-ray ordered as well as blood cultures drawn  I spoke to her sister, [name] and updated her clinically.  [name] is very close to her sister and is very concerned that her sister is not going to recover.  She states her sister was a DNR in the nursing home and would not want to go through a code or intubation.  I also spoke to her significant other of many years, [name], who is her primary healthcare power of attorney [name], her sister is her second).  Abe People is struggling with continuing full treatment including hemodialysis as well as partial code: were she to get sicker, and require intubation, he would go forward with that.  I also discussed what I thought was an active AICD.  Mr. Rosana Hoes stated she did not have AICD and upon further chart review on my behalf, patient does not have an AICD, she has an implanted loop recorder that was done in August 2017.  I attempted to elicit a place where in his opinion Sharlot would no longer find quality of life and he stated" living full-time in a nursing facility, long-term ventilation".  When I talked about tracheostomy being a limit he was unable to  specify whether that would be a care limit or not or whether he would go through with that.  At this point, he begins to recount a story of someone that required multiple intubations and ultimately recovered "to live another 16 good years"  Patient Profile: 73 y.o. female  with past medical history of end-stage renal disease on hemodialysis, multiple episodes of C. difficile with associated colitis, hypertension, anemia, atrial fib, AICD, history of right hip fracture, CVA, diabetes, diastolic heart failure admitted on 07/17/2018 with ongoing diarrhea and abdominal pain with associated nausea.  Patient was found to have C. difficile again  Consult ordered for goals of care.  When seen on 07/21/2018, patient appears more lethargic and is had periods of shortness of breath as well as loss of appetite  Plan Per healthcare power of attorney, [name] at [phone] continue with hemodialysis, treatment for C. difficile.  He does confirm that partial code is correct and that if she were to decline further she would want intubation but no CPR, no CODE BLUE, no BiPAP, no ACLS, no cardioversion or defibrillation  Palliative medicine to continue to speak to Mr. Rosana Hoes as well as patient's sister to provide clinical updates and assistance.  I did share with Mr. Rosana Hoes that in my opinion, heading towards ICU, intubation or even tracheostomy would not improve Alyzae's quality of life.  When I asked Mr. Rosana Hoes "what  would getting better look like" he said "coming home to me".  I continued to reinforce that as sick as she has been, those interventions would not meet her goals.  I did recommend a DNR.  Per patient's sister, patient was a DNR prior to coming to the hospital  Disposition To be determined  Prognosis Quite concerned about Mrs. Lazalde today.  I fear despite having C. difficile treated multiple times she is showing signs and symptoms of an unresolving infection which could lead to her death   Thank  you, Romona Curls, NP Total time: 25 minutes.  Greater than 50% of time was spent in counseling and coordination of care

## 2018-07-21 NOTE — Progress Notes (Signed)
Patient's sister and significant other at bedside.   Hiram Comber, RN 07/21/2018 6:04 PM

## 2018-07-21 NOTE — Progress Notes (Signed)
Upon assessment this morning, patient exhibiting labored and shallow breathing and c/o "burning up." Lung sounds clear & diminished. Temp 98.2, saturations 92% on 2L [place] and remains tachycardic 110's-120's. Denies any pain or discomfort. Alert and oriented x 4, although experiencing forgetfulness. Patient placed in the chair with max x 1 assist. Upon ambulation, patient's saturations dropped to the 80's. Oxygen titrated up to 4L [place]. Chair alarm placed and patient educated to call prior to exiting the chair. Encouraged deep breathing and incentive spirometer. Breakfast tray ordered. Will continue to monitor.   Hiram Comber, RN 07/21/2018 9:36 AM

## 2018-07-21 NOTE — Progress Notes (Addendum)
Family Medicine Teaching Service Daily Progress Note Intern Pager: [phone]  Patient name: Christine Santos Medical record number: [id number] Date of birth: August 31, 1945 Age: 73 y.o. Gender: female  Primary Care Provider: [name], MD Consultants: Nephrology, ID (s/o) Code Status: DNR, DO intubate  Pt Overview and Major Events to Date:  6/23-admission for C. difficile colitis  Assessment and Plan: Christine Eischen Rothis a 73 y.o.femalepresenting with 3 days of nausea, vomiting, diarrhea. Previous medical history is significant for episodes of C. difficile colitis, ESRD, heart failure with preserved ejection fraction, CAD, diabetes, GERD.  Recurrent C. difficile colitis WBC remains elevated: 24.7>>25.8>>18.6 (6/28).  Patient medically stable for discharge to SNF. Afebrile overnight.  Per ID note from 6/26, continue current treatment. If patient does not begin to improve, wonder if we should switch to fidaxomicin given multiple treatments with oral vanc tapers- will curbside ID regarding. -Potential FMT outpatient following resolution of current episode -Vanc p.o. 125 mg 4 times daily with the following taper:  125 mg orally 4 times daily for 10 days (6/23-7/2), followed by  125 mg orally twice daily for 7 days (7/3-7/9), followed by  125 mg orally once daily for 7 days (7/10-7/6), followed by   125 mg orally every 2 days for 6 weeks -Flexi-Seal placed -OT/PT: SNF -ID has signed off with plans to follow-up in outpatient clinic  Goals of care Patient is partial code (DNR, do intubate).  She would qualify for hospice if she were interested.  She has not elected to move to hospice care at this time.  She is like to move forward with a full scope of care. -Continue to discuss goals of care with patient as appropriate  ESRD (MWF) Dialysis session scheduled 6/29. ESA due 7/1 -Nephrology following, appreciate recommendations -Dialysis MWF  Thrombocytopenia Platelets 138,000  this morning.  Down from 158,000 on 6/26.  Continue to monitor labs for now. -Monitor CBC  Atrial fibrillation s/p ICD, chronic HR improved o/n. -Eliquis 5 mg daily -Metoprolol succinate 25 mg daily  Diabetes mellitus Blood sugars 149-207 in the past 24 hours 6 units aspart administered in the past 24 hours. -Continue insulin sliding scale -Consider beginning Lantus as she resumes her normal appetite  HFpEF, chronic Last echo (08/2015) shows EF of 55-60% with G1DD.  -Metoprolol as above  Anemia, macrocytic  Thrombocytopenia Hemoglobin 11.7 on admission.  MCV 103.  B12 and folate normal. ESA 7/1 per nephro.  -Monitor CBC  CAD  -Rosuvastatin 20 mg -Nitroglycerin SL as needed  GERD -Protonix 40 mg  Anxiety -Paxil 40 mg  Memory loss Moca performed by SLP on 6/25 showing a score of 12/30.  Consistent with moderate impairment.  She has no known acute changes to her mental status.  This is likely secondary to chronic decline in cognitive function.    FEN/GI: Renal diet PPx: Heparin SQ  Disposition: medically stable for discharge to SNF  Subjective:  Patient is complaining of abdominal pain this morning.  She is due for her scheduled pain medication.  States that she has not had a very good night as she has had to go to the bathroom multiple times.  Objective: Temp:  [97.9 F (36.6 C)-99 F (37.2 C)] 98.1 F (36.7 C) (06/28 0513) Pulse Rate:  [65-127] 103 (06/28 0513) Resp:  [12-25] 17 (06/28 0513) BP: (101-140)/(46-107) 140/107 (06/28 0513) SpO2:  [99 %-100 %] 100 % (06/28 0513) Physical Exam: General: NAD, pleasant sitting up in bed Cardiovascular: RRR, no m/r/g, 2+ pitting BL  LE edema Respiratory: CTA BL, normal work of breathing Gastrointestinal: soft, nontender, nondistended, active BS Derm: no rashes appreciated Psych: Appropriate affect  Laboratory: Recent Labs  Lab 07/20/18 0156 07/21/18 0225 07/22/18 0234  WBC 21.3* 22.0* 18.6*  HGB 11.5*  12.9 12.7  HCT 36.0 40.7 40.8  PLT 158 135* 138*   Recent Labs  Lab 07/17/18 1241  07/20/18 0156 07/21/18 0225 07/22/18 0234  NA 135   < > 133* 139 135  K 4.8   < > 3.1* 4.2 4.3  CL 92*   < > 92* 98 98  CO2 24   < > 25 23 20*  BUN 60*   < > 64* 36* 53*  CREATININE 10.45*   < > 7.34* 5.19* 6.44*  CALCIUM 8.6*   < > 7.7* 8.0* 8.0*  PROT 5.0*  --   --   --   --   BILITOT 1.2  --   --   --   --   ALKPHOS 187*  --   --   --   --   ALT 22  --   --   --   --   AST 21  --   --   --   --   GLUCOSE 210*   < > 158* 161* 190*   < > = values in this interval not displayed.    Imaging/Diagnostic Tests: Ct Abdomen Pelvis Wo Contrast Result Date: 07/17/2018 IMPRESSION: 1. The colon is mildly distended throughout, somewhat featureless, and fluid-filled to the rectum. Findings are suggestive of nonspecific infectious, inflammatory, or ischemic colitis and diarrheal illness. 2. Other chronic, incidental, and postoperative findings as detailed above. Electronically Signed   By: Eddie Candle M.D.   On: 07/17/2018 14:39    Lamone Ferrelli, [place], DO 07/22/2018, 5:40 AM PGY-2, [place] Intern pager: [phone], text pages welcome

## 2018-07-21 NOTE — Progress Notes (Signed)
Patient refusing to eat breakfast, stating she can't swallow. Patient took medications without any difficulty this morning. Discussed patient's clinical picture with Dr. Pilar Plate. Will continue to monitor.   Hiram Comber, RN 07/21/2018 10:00 AM

## 2018-07-22 DIAGNOSIS — R64 Cachexia: Secondary | ICD-10-CM

## 2018-07-22 DIAGNOSIS — R531 Weakness: Secondary | ICD-10-CM

## 2018-07-22 LAB — BASIC METABOLIC PANEL
Anion gap: 17 — ABNORMAL HIGH (ref 5–15)
BUN: 53 mg/dL — ABNORMAL HIGH (ref 8–23)
CO2: 20 mmol/L — ABNORMAL LOW (ref 22–32)
Calcium: 8 mg/dL — ABNORMAL LOW (ref 8.9–10.3)
Chloride: 98 mmol/L (ref 98–111)
Creatinine, Ser: 6.44 mg/dL — ABNORMAL HIGH (ref 0.44–1.00)
GFR calc Af Amer: 7 mL/min — ABNORMAL LOW (ref >60)
GFR calc non Af Amer: 6 mL/min — ABNORMAL LOW (ref >60)
Glucose, Bld: 190 mg/dL — ABNORMAL HIGH (ref 70–99)
Potassium: 4.3 mmol/L (ref 3.5–5.1)
Sodium: 135 mmol/L (ref 135–145)

## 2018-07-22 LAB — CBC
HCT: 40.8 % (ref 36.0–46.0)
Hemoglobin: 12.7 g/dL (ref 12.0–15.0)
MCH: 31.5 pg (ref 26.0–34.0)
MCHC: 31.1 g/dL (ref 30.0–36.0)
MCV: 101.2 fL — ABNORMAL HIGH (ref 80.0–100.0)
Platelets: 138 10*3/uL — ABNORMAL LOW (ref 150–400)
RBC: 4.03 MIL/uL (ref 3.87–5.11)
RDW: 19.4 % — ABNORMAL HIGH (ref 11.5–15.5)
WBC: 18.6 10*3/uL — ABNORMAL HIGH (ref 4.0–10.5)
nRBC: 0.3 % — ABNORMAL HIGH (ref 0.0–0.2)

## 2018-07-22 LAB — GLUCOSE, CAPILLARY
Glucose-Capillary: 208 mg/dL — ABNORMAL HIGH (ref 70–99)
Glucose-Capillary: 203 mg/dL — ABNORMAL HIGH (ref 70–99)

## 2018-07-22 MED ORDER — MORPHINE SULFATE 10 MG/5ML PO SOLN: 5.0000 mg | ORAL | Status: DC | PRN

## 2018-07-22 MED ORDER — MORPHINE SULFATE 10 MG/5ML PO SOLN: 2.5000 mg | ORAL | Status: DC | PRN

## 2018-07-22 MED ORDER — MORPHINE SULFATE ER 15 MG PO TBCR
15.0000 mg | EXTENDED_RELEASE_TABLET | Freq: Two times a day (BID) | ORAL | Status: DC
  Administered 2018-07-22 – 2018-07-23 (×3): 15 mg via ORAL
  Filled 2018-07-22 (×3): qty 1

## 2018-07-22 MED ORDER — CHLORHEXIDINE GLUCONATE CLOTH 2 % EX PADS
6.0000 | MEDICATED_PAD | Freq: Every day | CUTANEOUS | Status: DC
  Administered 2018-07-22: 6 via TOPICAL

## 2018-07-22 NOTE — Progress Notes (Addendum)
Palliative Medicine Progress note  Pt seen, chart reviewed. Discussed with FMTS. Pt ready for DC but likely not today as she is going back to SNF and will need Covid test. Still c/o abd pain, withdrawn. Not wanting to participate in conversations. HCPOA, long term partner, [name], in conversation 07/21/18 confirms intubation and rehospitalization if necessary.   Pt would benenfit from ongoing [place] in SNF. Those services can be accesed either thru [place], a [phone] [place] at; or [facility]'s Palliative Medicine dicivison at [phone]  Please place these resources on discharge summary  Patient Profile:72 y.o.femalewith past medical history of end-stage renal disease on hemodialysis, multiple episodes of C. difficile with associated colitis, hypertension, anemia, atrial fib, AICD, history of right hip fracture, CVA, diabetes, diastolic heart failureadmitted on 6/23/2020with ongoing diarrhea and abdominal pain with associated nausea. Patient was found to have C. difficile again  Consult ordered for goals of care.  When seen on 07/21/2018, patient appears more lethargic and is had periods of shortness of breath as well as loss of appetite. Very weak and withdrawn  Disposition Back to SNF  Prognosis Guarded.  Patient wishes to continue dialysis.  Were this not the case, she would qualify for her hospice benefit  Thank you, Romona Curls, ANP Total time: 15 minutes Greater than 50% of time was spent in counseling and coordination of care

## 2018-07-22 NOTE — Progress Notes (Signed)
Patient made comfort care. PIV discontinued. Telemetry monitor removed. Will continue to monitor and make patient as comfortable as possible.   Hiram Comber, RN 07/22/2018 1:55 PM

## 2018-07-22 NOTE — Progress Notes (Signed)
Per patient's HOA Abe People and [name], patient's sons Takyia Sindt & Miyonna Ormiston not to have any patient information released to them.    [name] updated on patient's plan of care via phone. Questions answered. Will continue to monitor.  Hiram Comber, RN 07/22/2018 10:41 AM

## 2018-07-22 NOTE — Progress Notes (Addendum)
[facility] Progress Note   Dialysis Orders: [facility] MWF   4h  350/600  74kg  2/2 bath  P4  AVG  Hep 2400 +2035midrun -Venofer 50 mg q week -Mircera 150 mcg IV q 2 weeks (last dose 6/17) -Calcitriol 0.25 TIW  Assessment/Plan: 1. C. Diff colitis- Flexi-Seal placed. On PO Vanc taper -per primary. Afebrile  WBC trending down.  Will be difficult for her to go to outpatient HD if she is having diarrhea at d/c 2. ESRD -MWF. K 4.3 - next HD Monday- run for 3.5 hr due to high inpatient cennsus 3. Hypertension/volume - BP stable.  Net UF 1 L Friday > 69.4 -expect she is losing weight  4. Anemia - hgb 12.7 .5 No ESA needs currentlyESA due 7/1 5. Metabolic bone disease -Continue VDRA/binders 6. Nutrition -Prot supp for low albumin - minimal input recorded the past few days - spoke with RN - will need assistance with meals- little input recorded - I had to hold her cup for her to drink water through a straw 7. AFib s/p ICD  - on metoprolol /Eliquis HR remains elevated, has ICD - HR labile today 8. Hx CVA 9. DM  10. Goals of care - partial code while here but plan for discharge likely full code - appreciate palliative care consult  11. Disp - from [employer] - likely to return there very weak 12. Thrombocytopenia - 130s x 2 day stable - follow  13.  Anxiety -   Christine Santos, Christine Santos Beeper [phone] 07/22/2018,11:10 AM  LOS: 4 days     Subjective:   C/o abdominal pain. Having stools via flexiseal. Thirsty - gave cup of water while in room. Objective Vitals:   07/21/18 2105 07/22/18 0513 07/22/18 0913 07/22/18 0914  BP: (!) 129/46 (!) 140/107 120/70   Pulse: (!) 113 (!) 103  (!) 119  Resp: 12 17 16    Temp: 99 F (37.2 C) 98.1 F (36.7 C) 98.7 F (37.1 C)   TempSrc: Axillary Oral Oral   SpO2: 100% 100% 94%   Weight:      Height:       Physical Exam General: ill appearing female in bed breathing easily Heart: irreg irreg  tachy Lungs: grossly clear  Abdomen: soft generalized tenderness Extremities: tr LE edema Dialysis Access:  Left upper AVGG + bruit    Additional Objective Labs: Basic Metabolic Panel: Recent Labs  Lab 07/20/18 0156 07/21/18 0225 07/22/18 0234  NA 133* 139 135  K 3.1* 4.2 4.3  CL 92* 98 98  CO2 25 23 20*  GLUCOSE 158* 161* 190*  BUN 64* 36* 53*  CREATININE 7.34* 5.19* 6.44*  CALCIUM 7.7* 8.0* 8.0*   Liver Function Tests: Recent Labs  Lab 07/17/18 1241  AST 21  ALT 22  ALKPHOS 187*  BILITOT 1.2  PROT 5.0*  ALBUMIN 2.5*   Recent Labs  Lab 07/17/18 1241  LIPASE 17   CBC: Recent Labs  Lab 07/18/18 0305  07/19/18 0228 07/20/18 0156 07/21/18 0225 07/22/18 0234  WBC 24.9*  --  25.8* 21.3* 22.0* 18.6*  HGB 11.6*  --  11.6* 11.5* 12.9 12.7  HCT 36.2   < > 37.3 36.0 40.7 40.8  MCV 100.3*  --  101.4* 100.0 101.0* 101.2*  PLT 202  --  186 158 135* 138*   < > = values in this interval not displayed.   Blood Culture    Component Value Date/Time   SDES BLOOD  RIGHT FOREARM 07/21/2018 1133   SPECREQUEST  07/21/2018 1133    BOTTLES DRAWN AEROBIC ONLY Blood Culture results may not be optimal due to an inadequate volume of blood received in culture bottles   CULT  07/21/2018 1133    NO GROWTH < 24 HOURS Performed at [facility] [address] [place], [place] [address]    REPTSTATUS PENDING 07/21/2018 1133    Cardiac Enzymes: No results for input(s): CKTOTAL, CKMB, CKMBINDEX, TROPONINI in the last 168 hours. CBG: Recent Labs  Lab 07/21/18 0748 07/21/18 1148 07/21/18 1651 07/21/18 2108 07/22/18 0800  GLUCAP 155* 149* 207* 162* 208*   Iron Studies: No results for input(s): IRON, TIBC, TRANSFERRIN, FERRITIN in the last 72 hours. Lab Results  Component Value Date   INR 1.2 06/09/2018   INR 1.1 06/07/2018   INR 1.1 06/06/2018   Studies/Results: Dg Chest Port 1 View  Result Date: 07/21/2018 CLINICAL DATA:  73 year old female with weakness and  body aches EXAM: PORTABLE CHEST 1 VIEW COMPARISON:  Prior chest x-ray 06/09/2018 FINDINGS: Stable cardiac and mediastinal contours. 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 [employer] implantable loop recorder projects over the left chest. The lungs are clear. No pulmonary edema or pneumothorax. Small left pleural effusion, unchanged. No acute osseous abnormality. IMPRESSION: 1. Stable chest x-ray without evidence of acute cardiopulmonary process. 2. Stable cardiomegaly status post multivessel CABG. 3. Stable small left pleural effusion. Electronically Signed   By: Jacqulynn Cadet M.D.   On: 07/21/2018 13:28   Medications: . sodium chloride    . sodium chloride     . apixaban  5 mg Oral BID  . calcitRIOL  0.25 mcg Oral Q M,W,F-HD  . Chlorhexidine Gluconate Cloth  6 each Topical Q0600  . famotidine  10 mg Oral Daily  . feeding supplement (NEPRO CARB STEADY)  237 mL Oral BID BM  . feeding supplement (PRO-STAT SUGAR FREE 64)  30 mL Oral TID WC  . ferric citrate  420 mg Oral TID WC  . insulin aspart  0-9 Units Subcutaneous TID WC  . Melatonin  3 mg Oral QHS  . metoprolol succinate  50 mg Oral Daily  . multivitamin  1 tablet Oral QHS  . nystatin  5 mL Oral QID  . omega-3 acid ethyl esters  1,000 mg Oral Daily  . oxyCODONE  5 mg Oral Q6H  . PARoxetine  40 mg Oral Q breakfast  . polyvinyl alcohol  1 drop Both Eyes TID  . rosuvastatin  20 mg Oral Daily  . sodium chloride flush  3 mL Intravenous Once  . vancomycin  125 mg Oral QID   Followed by  . [START ON 07/31/2018] vancomycin  125 mg Oral BID   Followed by  . [START ON 08/08/2018] vancomycin  125 mg Oral Daily   Followed by  . [START ON 08/15/2018] vancomycin  125 mg Oral QODAY   Followed by  . [START ON 09/12/2018] vancomycin  125 mg Oral Q3 days  . vitamin C  1,000 mg Oral Daily

## 2018-07-22 NOTE — Progress Notes (Addendum)
Family Medicine Teaching Service Daily Progress Note Intern Pager: [phone]  Patient name: Christine Santos Medical record number: [id number] Date of birth: Dec 02, 1945 Age: 73 y.o. Gender: female  Primary Care Provider: [name], MD Consultants: Nephrology, ID (s/o) Code Status: DNR, DO intubate  Pt Overview and Major Events to Date:  6/23-admission for C. difficile colitis 6/28 - opted for Full comfort care  Assessment and Plan: Christine Santos a 73 y.o.femalepresenting with 3 days of nausea, vomiting, diarrhea. Previous medical history is significant for episodes of C. difficile colitis, ESRD, heart failure with preserved ejection fraction, CAD, diabetes, GERD.  Goals of Care: GOC discussion with Dr. Andria Frames on 6/28. Patient has opted for full comfort care with discharge to home with home hospice. This was agreed upon with husband, Abe People and sister/HCPOA, [name]. Orders reviewed and all treatment discontinued that do not add to comfort. She is DNR/DNI. Patient very sleepy during exam, expect likely 2/2 to morphine. - discontinue scheduled MS Contin  - begin schedule Oxycodone 5mg  q6 hours - Morphine 5mg  q2hr PRN for breakthrough pain - Phenergan 12.5mg  q6h PRN for n/v  Recurrent C. difficile colitis - continue Flexi-seal placed - continue oral PO vanc taper  ESRD (MWF) Patient has opted to discontinue dialysis and undergo full comfort care. Nephrology notified of patient decision.   Acute/Chronic Problems: Monitoring and treating only to maintain comfort - Thrombocytopenia - Atrial fibrillation s/p ICD, chronic - home meds discontinued  - Diabetes mellitus - home meds discontinued - HFpEF, chronic - home meds discontinued - Anemia, macrocytic  - CAD - home meds discontinued.  - GERD - Anxiety - home meds discontinued  FEN/GI: Renal diet PPx: Heparin SQ  Disposition: medically stable for discharge to SNF  Subjective:  Patient very sleepy this AM. But  denies any complaints or concerns. Looking forward to going home.  Objective: Temp:  [99 F (37.2 C)-99.4 F (37.4 C)] 99.4 F (37.4 C) (06/29 0533) Pulse Rate:  [91-94] 91 (06/29 0533) Resp:  [16-18] 18 (06/29 0533) BP: (114-120)/(64-83) 114/65 (06/29 0533) SpO2:  [92 %-100 %] 99 % (06/29 0533) Physical Exam: General: resting comfortably in bed, NAD CV: irregularly irregular, no murmurs appreciated, 2+ pitting edema bilaterally  Lungs: normal work of breathing on 2L O2 Abdomen: soft, non-tender, non-distended, normoactive bowel sounds Skin: warm, dry Extremities: warm and well perfused Neuro: Very sleepy on exam, arousalable, answers most questions appropriately but does fall back to sleep easily  Laboratory: Recent Labs  Lab 07/20/18 0156 07/21/18 0225 07/22/18 0234  WBC 21.3* 22.0* 18.6*  HGB 11.5* 12.9 12.7  HCT 36.0 40.7 40.8  PLT 158 135* 138*   Recent Labs  Lab 07/17/18 1241  07/20/18 0156 07/21/18 0225 07/22/18 0234  NA 135   < > 133* 139 135  K 4.8   < > 3.1* 4.2 4.3  CL 92*   < > 92* 98 98  CO2 24   < > 25 23 20*  BUN 60*   < > 64* 36* 53*  CREATININE 10.45*   < > 7.34* 5.19* 6.44*  CALCIUM 8.6*   < > 7.7* 8.0* 8.0*  PROT 5.0*  --   --   --   --   BILITOT 1.2  --   --   --   --   ALKPHOS 187*  --   --   --   --   ALT 22  --   --   --   --  AST 21  --   --   --   --   GLUCOSE 210*   < > 158* 161* 190*   < > = values in this interval not displayed.    Imaging/Diagnostic Tests: Ct Abdomen Pelvis Wo Contrast Result Date: 07/17/2018 IMPRESSION: 1. The colon is mildly distended throughout, somewhat featureless, and fluid-filled to the rectum. Findings are suggestive of nonspecific infectious, inflammatory, or ischemic colitis and diarrheal illness. 2. Other chronic, incidental, and postoperative findings as detailed above. Electronically Signed   By: Eddie Candle M.D.   On: 07/17/2018 14:39    Danna Hefty, DO 07/23/2018, 9:21 AM PGY-74, [place] Intern pager: [phone], text pages welcome

## 2018-07-23 ENCOUNTER — Telehealth: Payer: Self-pay | Admitting: *Deleted

## 2018-07-23 MED ORDER — MORPHINE SULFATE 10 MG/5ML PO SOLN
5.0000 mg | ORAL | Status: DC | PRN
  Filled 2018-07-23: qty 4

## 2018-07-23 MED ORDER — VANCOMYCIN 50 MG/ML ORAL SOLUTION
125.0000 mg | Freq: Four times a day (QID) | ORAL | Status: DC
  Administered 2018-07-23 – 2018-07-24 (×6): 125 mg via ORAL
  Filled 2018-07-23 (×13): qty 2.5

## 2018-07-23 MED ORDER — VANCOMYCIN 50 MG/ML ORAL SOLUTION: 125.0000 mg | ORAL | Status: DC

## 2018-07-23 MED ORDER — MORPHINE SULFATE 10 MG/5ML PO SOLN
5.0000 mg | Freq: Four times a day (QID) | ORAL | Status: DC
  Administered 2018-07-23 – 2018-07-24 (×4): 5 mg via ORAL
  Filled 2018-07-23 (×4): qty 4

## 2018-07-23 MED ORDER — PROMETHAZINE HCL 25 MG PO TABS: 12.5000 mg | ORAL_TABLET | Freq: Four times a day (QID) | ORAL | Status: DC | PRN

## 2018-07-23 MED ORDER — VANCOMYCIN 50 MG/ML ORAL SOLUTION: 125.0000 mg | Freq: Two times a day (BID) | ORAL | Status: DC

## 2018-07-23 MED ORDER — VANCOMYCIN 50 MG/ML ORAL SOLUTION: 125.0000 mg | Freq: Every day | ORAL | Status: DC

## 2018-07-23 NOTE — TOC Progression Note (Signed)
Transition of Care [facility]) - Progression Note    Patient Details  Name: Christine Santos MRN: [id number] Date of Birth: 10-Jul-1945  [facility] CM/SW [place], LCSW Phone Number: 07/23/2018, 3:44 PM  Clinical Narrative:    Per [place], they are going to order oxygen to be delivered to the home, so patient will discharge by PTAR tomorrow around lunchtime. [name] and [place] aware.    Expected Discharge Plan: Skilled Nursing Facility(PTA lived with 11 y/o blind boyfriend) Barriers to Discharge: Continued Medical Work up  Expected Discharge Plan and Services Expected Discharge Plan: Skilled Nursing Facility(PTA lived with 38 y/o blind boyfriend) In-house Referral: Clinical Social Work Discharge Planning Services: NA Post Acute Care Choice: [place] Living arrangements for the past 2 months: [place], [place] Expected Discharge Date: 07/21/18               DME Arranged: (owns walker , W/C, BSC)         HH Arranged: NA           Social Determinants of Health (SDOH) Interventions    Readmission Risk Interventions Readmission Risk Prevention Plan 06/12/2018  Transportation Screening Complete  Medication Review Press photographer) Complete  PCP or Specialist appointment within 3-5 days of discharge Complete  HRI or Home Care Consult Complete  SW Recovery Care/Counseling Consult Complete  [place] Complete  Some recent data might be hidden

## 2018-07-23 NOTE — Discharge Summary (Signed)
[facility] Discharge Summary  Patient name: Christine Santos Medical record number: [id number] Date of birth: [birth year] Age: 73 y.o. Gender: female Date of Admission: 07/17/2018  Date of Discharge: 07/24/2018 Admitting Physician: Kinnie Feil, MD  Primary Care Provider: [name], MD Consultants: Palliative Care, ID  Indication for Hospitalization: Diarrhea  Discharge Diagnoses/Problem List:  Recurrent C. difficile colitis ESRD Thrombocytopenia Atrial fibrillation Diabetes Heart failure with preserved ejection fraction Anemia CAD GERD Anxiety Memory loss  Disposition: Home with Home Hospice   Discharge Condition: Stable  Discharge Exam:  General: resting comfortably in bed, NAD CV: irregularly irregular, no murmurs appreciated, 2+ pitting edema bilaterally  Lungs: normal work of breathing on 2L O2 Abdomen: soft, non-tender, non-distended, normoactive bowel sounds Skin: warm, dry Extremities: warm and well perfused Neuro: Awake and alert, answers all questions appropriately  Brief Hospital Course:  Christine Santos a 73 y.o.femalepresenting with 3 days of nausea, vomiting, diarrhea. Previous medical history is significant for episodes of C. difficile colitis, ESRD, heart failure with preserved ejection fraction, CAD, diabetes, GERD.  Goals of care On admission Ms. Christine Santos declared that she would like to be partial code (okay to intubate, no CPR) she also reported that she would like to pursue all medical interventions.  During her hospitalization, her medical status declined and she grew increasingly weak and uncomfortable.  Dr. Andria Frames (well-known to patient) engaged in multiple conversations regarding her goals of care in addition to palliative care.  On 6/28, she decided to move to comfort care.  At that time she was removed from her dialysis schedule and ongoing medical management was focused on comfort.   Recurrent C. difficile  colitis On presentation to the hospital, she is found to have 3 days of watery diarrhea and clearly to have 2-3 bowel movements per hour.  She also missed at least one session of dialysis is moderately fluid overloaded.  Stool studies are positive for C. difficile toxin and C. difficile antigen.  P.o. vancomycin was started in the ED.  After no significant improvement with 2 days of oral vancomycin, infectious disease was consulted to consider fecal microbiota transplant (infectious disease had recommended potential F MT during previous hospitalizations for C. difficile colitis.  Infectious disease reported that she would be eligible for FMT following resolution of this acute episode of C. difficile colitis, in the meantime a vancomycin taper was prescribed.  On 6/28 she decided to move to comfort care following a discussion with her PCP, Dr. Andria Frames. Continued treatment with Oral vancomycin given likely benefit in regards to ongoing comfort.    Issues for Follow Up:  1. Please continue to ensure comfort for patient 2. Oxygen delivered to home prior to discharge and home hospice arranged to meet patient shortly after  Significant Procedures: None  Significant Labs and Imaging:  Recent Labs  Lab 07/20/18 0156 07/21/18 0225 07/22/18 0234  WBC 21.3* 22.0* 18.6*  HGB 11.5* 12.9 12.7  HCT 36.0 40.7 40.8  PLT 158 135* 138*   Recent Labs  Lab 07/18/18 0305 07/19/18 0228 07/20/18 0156 07/21/18 0225 07/22/18 0234  NA 135 137 133* 139 135  K 4.3 3.8 3.1* 4.2 4.3  CL 91* 94* 92* 98 98  CO2 24 24 25 23  20*  GLUCOSE 174* 160* 158* 161* 190*  BUN 70* 41* 64* 36* 53*  CREATININE 9.91* 6.54* 7.34* 5.19* 6.44*  CALCIUM 7.7* 7.9* 7.7* 8.0* 8.0*   Lipase: 17 C. Diff Antigen and Toxin positive COVID  neg Vit B12 1691 Folate >1727 HBsAg negative   Ct Abdomen Pelvis Wo Contrast Result Date: 07/17/2018 CLINICAL DATA:  Abdominal pain, gastroenteritis or colitis suspected EXAM: CT ABDOMEN AND PELVIS  WITHOUT CONTRAST TECHNIQUE: Multidetector CT imaging of the abdomen and pelvis was performed following the standard protocol without IV contrast. COMPARISON:  10/27/2017 FINDINGS: Lower chest: No acute abnormality. Cardiomegaly and extensive 3 vessel coronary artery calcifications and/or stents. Hepatobiliary: No focal liver abnormality is seen. Status post cholecystectomy. No biliary dilatation. Pancreas: Unremarkable. No pancreatic ductal dilatation or surrounding inflammatory changes. Spleen: Normal in size without significant abnormality. Adrenals/Urinary Tract: Adrenal glands are unremarkable. Atrophic kidneys. No renal calculi, solid lesion, or hydronephrosis. Bladder is unremarkable. Stomach/Bowel: Stomach is within normal limits. Appendix appears normal. The colon is mildly distended throughout, somewhat featureless, and fluid-filled to the rectum. Vascular/Lymphatic: Aortic atherosclerosis and vascular calcinosis. No enlarged abdominal or pelvic lymph nodes. Reproductive: No mass or other significant abnormality. Other: Fat containing midline ventral hernia unchanged from prior. No abdominopelvic ascites. Musculoskeletal: No acute or significant osseous findings. IMPRESSION: 1. The colon is mildly distended throughout, somewhat featureless, and fluid-filled to the rectum. Findings are suggestive of nonspecific infectious, inflammatory, or ischemic colitis and diarrheal illness. 2. Other chronic, incidental, and postoperative findings as detailed above. Electronically Signed   By: Eddie Candle M.D.   On: 07/17/2018 14:39   Dg Chest Port 1 View Result Date: 07/21/2018 CLINICAL DATA:  73 year old female with weakness and body aches EXAM: PORTABLE CHEST 1 VIEW COMPARISON:  Prior chest x-ray 06/09/2018 FINDINGS: Stable cardiac and mediastinal contours. 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 Medtronic implantable loop recorder projects over the  left chest. The lungs are clear. No pulmonary edema or pneumothorax. Small left pleural effusion, unchanged. No acute osseous abnormality. IMPRESSION: 1. Stable chest x-ray without evidence of acute cardiopulmonary process. 2. Stable cardiomegaly status post multivessel CABG. 3. Stable small left pleural effusion. Electronically Signed   By: Jacqulynn Cadet M.D.   On: 07/21/2018 13:28   Results/Tests Pending at Time of Discharge: None  Discharge Medications:  Allergies as of 07/24/2018      Reactions   Cefepime Anaphylaxis   Throat swelling   Amoxicillin Rash   Did it involve swelling of the face/tongue/throat, SOB, or low BP? No Did it involve sudden or severe rash/hives, skin peeling, or any reaction on the inside of your mouth or nose? No Did you need to seek medical attention at a hospital or doctor's office? No When did it last happen?10+ years If all above answers are "NO", may proceed with cephalosporin use.   Codeine Phosphate Rash   Penicillins Rash   Did it involve swelling of the face/tongue/throat, SOB, or low BP? No Did it involve sudden or severe rash/hives, skin peeling, or any reaction on the inside of your mouth or nose? No Did you need to seek medical attention at a hospital or doctor's office? No When did it last happen?10+ years If all above answers are "NO", may proceed with cephalosporin use.   Propoxyphene Rash      Medication List    STOP taking these medications   Accu-Chek Aviva Plus test strip Generic drug: glucose blood   acetaminophen 500 MG tablet Commonly known as: TYLENOL   allopurinol 100 MG tablet Commonly known as: ZYLOPRIM   apixaban 5 MG Tabs tablet Commonly known as: Eliquis   calcitRIOL 0.25 MCG capsule Commonly known as: ROCALTROL   carboxymethylcellulose  0.5 % Soln Commonly known as: REFRESH PLUS   EYE VITAMINS PO   ferric citrate 1 GM 210 MG(Fe) tablet Commonly known as: AURYXIA   Fish Oil 1000 MG Caps   GNP  GARLIC EXTRACT PO   lidocaine-prilocaine cream Commonly known as: EMLA   Melatonin 10 MG Caps   metoprolol succinate 25 MG 24 hr tablet Commonly known as: TOPROL-XL   multivitamins ther. w/minerals Tabs tablet   nitroGLYCERIN 0.4 MG SL tablet Commonly known as: NITROSTAT   pantoprazole 40 MG tablet Commonly known as: PROTONIX   PARoxetine 30 MG tablet Commonly known as: PAXIL   PARoxetine 40 MG tablet Commonly known as: PAXIL   rosuvastatin 20 MG tablet Commonly known as: CRESTOR   vitamin C 1000 MG tablet     TAKE these medications   HumaLOG 100 UNIT/ML injection Generic drug: insulin lispro Inject 5 Units into the skin 3 (three) times daily before meals. What changed: Another medication with the same name was removed. Continue taking this medication, and follow the directions you see here.   Lantus 100 UNIT/ML injection Generic drug: insulin glargine Inject 5 Units into the skin at bedtime.   Lantus SoloStar 100 UNIT/ML Solostar Pen Generic drug: Insulin Glargine Inject 12 Units into the skin daily.   morphine 10 MG/5ML solution Take 2.5 mLs (5 mg total) by mouth every 6 (six) hours.   morphine 10 MG/5ML solution Take 2.5 mLs (5 mg total) by mouth every 2 (two) hours as needed for severe pain.   vancomycin 50 mg/mL  oral solution Commonly known as: VANCOCIN Take 2.5 mLs (125 mg total) by mouth 4 (four) times daily for 10 days.   vancomycin 50 mg/mL  oral solution Commonly known as: VANCOCIN Take 2.5 mLs (125 mg total) by mouth 2 (two) times daily for 7 days. Start taking on: August 03, 2018   vancomycin 50 mg/mL  oral solution Commonly known as: VANCOCIN Take 2.5 mLs (125 mg total) by mouth daily for 6 days. Start taking on: August 10, 2018   vancomycin 50 mg/mL  oral solution Commonly known as: VANCOCIN Take 2.5 mLs (125 mg total) by mouth every other day for 27 days. Start taking on: August 17, 2018   vancomycin 50 mg/mL  oral solution Commonly  known as: VANCOCIN Take 2.5 mLs (125 mg total) by mouth every 3 (three) days. Start taking on: September 14, 2018       Discharge Instructions: Please refer to Patient Instructions section of EMR for full details.  Patient was counseled important signs and symptoms that should prompt return to medical care, changes in medications, dietary instructions, activity restrictions, and follow up appointments.   Follow-Up Appointments: Follow up with PCP if desired  Danna Hefty, DO 07/24/2018, 1:30 PM PGY-1, [place]

## 2018-07-23 NOTE — TOC Progression Note (Signed)
Transition of Care [facility]) - Progression Note    Patient Details  Name: Christine Santos MRN: [id number] Date of Birth: 25-Nov-1945  Transition of Care [facility]) CM/SW [place], LCSW Phone Number: 07/23/2018, 12:55 PM  Clinical Narrative:    CSW received consult for home hospice services. CSW spoke with patient's significant other and he is accepting of services. CSW offered choice and he and [name] are accepting of Manufacturing engineer. CSW sent referral for review.    Expected Discharge Plan: Skilled Nursing Facility(PTA lived with 62 y/o blind boyfriend) Barriers to Discharge: Continued Medical Work up  Expected Discharge Plan and Services Expected Discharge Plan: Skilled Nursing Facility(PTA lived with 49 y/o blind boyfriend) In-house Referral: Clinical Social Work Discharge Planning Services: NA Post Acute Care Choice: [place] Living arrangements for the past 2 months: [place], [place] Expected Discharge Date: 07/21/18               DME Arranged: (owns walker , W/C, [facility])         HH Arranged: NA           Social Determinants of Health (SDOH) Interventions    Readmission Risk Interventions Readmission Risk Prevention Plan 06/12/2018  Transportation Screening Complete  Medication Review Press photographer) Complete  PCP or Specialist appointment within 3-5 days of discharge Complete  HRI or Home Care Consult Complete  SW Recovery Care/Counseling Consult Complete  [place] Complete  Some recent data might be hidden

## 2018-07-23 NOTE — Telephone Encounter (Signed)
Received phone call from patients sister informing me to cancel her upcoming research appointment due to her declining health and hospice referral. I informed her about how sorry I am to here this news and to call if I could do anything for her or the family.

## 2018-07-23 NOTE — Progress Notes (Signed)
Renal Navigator notified OP HD clinic/[facility] of patient's transition to [place] and discontinuation of HD at this time. She will be discharged from the HD clinic/[facility].  Alphonzo Cruise, [place]  Renal Navigator [phone]

## 2018-07-23 NOTE — Progress Notes (Addendum)
Manufacturing engineer [facility])  Received request from [place] for patient/family interest in [place] services at home after discharge. Chart reviewed. Spoke with Sister [name]. Eligibility confirmed by [facility] physician.   Spoke with sister [name] by phone to confirm interest, answer questions, discuss DME. Per [name] patient prefers her own bed and may order a hospital bed at a later time. Discharge date not determined at time of this phone call although [name] was thinking not today.   [place] referral center aware of above and will contact [name] to arrange first RN visit after discharge.  Please contact [place] liaison when discharge date is confirmed.   Please send scripts for comfort medications home with patient.   Please send signed and completed out of facility DNR home with patient.   Thank you,  Erling Conte, LCSW [phone]   Addendem: Oxygen setup ordered from [place]. Mr. Rosana Hoes is the contact to work with [employer] on getting oxygen in home. CSW [place] aware.

## 2018-07-24 ENCOUNTER — Encounter (HOSPITAL_COMMUNITY): Payer: Medicare Other

## 2018-07-24 ENCOUNTER — Other Ambulatory Visit: Payer: Self-pay | Admitting: Family Medicine

## 2018-07-24 DIAGNOSIS — A0472 Enterocolitis due to Clostridium difficile, not specified as recurrent: Secondary | ICD-10-CM

## 2018-07-24 DIAGNOSIS — Z992 Dependence on renal dialysis: Secondary | ICD-10-CM | POA: Diagnosis not present

## 2018-07-24 DIAGNOSIS — N186 End stage renal disease: Secondary | ICD-10-CM | POA: Diagnosis not present

## 2018-07-24 DIAGNOSIS — E1122 Type 2 diabetes mellitus with diabetic chronic kidney disease: Secondary | ICD-10-CM | POA: Diagnosis not present

## 2018-07-24 MED ORDER — MORPHINE SULFATE 10 MG/5ML PO SOLN: 5.0000 mg | ORAL | 0 refills | Status: DC | PRN

## 2018-07-24 MED ORDER — VANCOMYCIN 50 MG/ML ORAL SOLUTION: 125.0000 mg | ORAL | 0 refills | Status: DC

## 2018-07-24 MED ORDER — VANCOMYCIN 50 MG/ML ORAL SOLUTION: 125.0000 mg | Freq: Every day | ORAL | 0 refills | Status: DC

## 2018-07-24 MED ORDER — VANCOMYCIN 50 MG/ML ORAL SOLUTION: 125.0000 mg | Freq: Two times a day (BID) | ORAL | 0 refills | Status: DC

## 2018-07-24 MED ORDER — VANCOMYCIN 50 MG/ML ORAL SOLUTION: 125.0000 mg | Freq: Four times a day (QID) | ORAL | 0 refills | Status: DC

## 2018-07-24 MED ORDER — MORPHINE SULFATE 10 MG/5ML PO SOLN: 5.0000 mg | Freq: Four times a day (QID) | ORAL | 0 refills | Status: DC

## 2018-07-24 NOTE — TOC Transition Note (Addendum)
Transition of Care [facility]) - CM/SW Discharge Note   Patient Details  Name: Christine Santos MRN: [id number] Date of Birth: August 31, 1945  [facility] CM/SW Contact:  Sharin Mons, RN Phone Number: 07/24/2018, 3:27 PM   Clinical Narrative:    Pt will transition to home today with hospice care once DME oxygen has been delivered to pt's home.  PTAR services will be arranged for pt pick up per nurse once equipment is in the home.Pt sister and sgo [place] [employer]) to received pt @ home.  [name] (Sister) [name] [phone],  [phone] ( cell) [phone]     07/24/2018 @ 1830 Pt pick up already arranged with PTAR by bedside nurse. NCM called PTAR to confirmed time for pt pick. PTAR stated it will be at least  2 hrs for pick up (8:30 pm). NCM made nurse, sister([name]) and hospice intake nurse, Altha Harm [phone]) aware.  Final next level of care: Home w Hospice Care Barriers to Discharge: No Barriers Identified   Patient Goals and CMS Choice Patient states their goals for this hospitalization and ongoing recovery are:: Return home CMS Medicare.gov Compare Post Acute Care list provided to:: Patient Choice offered to / list presented to : Patient  Discharge Placement                       Discharge Plan and Services In-house Referral: Clinical Social Work Discharge Planning Services: NA Post Acute Care Choice: [place]          DME Arranged: (owns walker , W/C, [facility])         HH Arranged: NA          Social Determinants of Health (SDOH) Interventions     Readmission Risk Interventions Readmission Risk Prevention Plan 06/12/2018  Transportation Screening Complete  Medication Review Press photographer) Complete  PCP or Specialist appointment within 3-5 days of discharge Complete  HRI or Home Care Consult Complete  SW Recovery Care/Counseling Consult Complete  Palliative Care Screening Not Applicable  Skilled Nursing  Facility Complete  Some recent data might be hidden

## 2018-07-24 NOTE — Consult Note (Signed)
[facility] CM Inpatient Consult   07/24/2018  KATHRINE RIEVES 1945/04/12 [id number]   Follow up:  Disposition  Chart reviewed and reveals the patient is currently transitioning to Hospice with [facility]. Given this choice patient will have full case management services through Hospice.  Will sign off at this time as patient needs will be met with hospice care.    Natividad Brood, RN BSN [facility] Liaison  [phone] business mobile phone Toll free office [phone]  Fax number: [phone] [email or web address]

## 2018-07-24 NOTE — Progress Notes (Addendum)
NCM learned from pt's sister [name] DME oxygen hasnt been delivered to pt's home and hasn't received a call from [place] to confirmed delivery time. NCM called [employer] regarding  oxygen delivery time. Per [employer]/[place] ticket time shown for equipment delivery is set for 11am-3pm. NCM to made family aware. Whitman Hero RN,BSN,CM

## 2018-07-24 NOTE — Progress Notes (Signed)
Sister Linna Hoff reviewed AVS no further questions Packet in chart and patient awaiting PTAR.

## 2018-07-25 ENCOUNTER — Other Ambulatory Visit: Payer: Self-pay | Admitting: Family Medicine

## 2018-07-25 ENCOUNTER — Telehealth: Payer: Self-pay

## 2018-07-25 MED ORDER — VANCOMYCIN 50 MG/ML ORAL SOLUTION: 125.0000 mg | Freq: Two times a day (BID) | ORAL | 0 refills | Status: AC

## 2018-07-25 MED ORDER — VANCOMYCIN 50 MG/ML ORAL SOLUTION: 125.0000 mg | ORAL | 0 refills | Status: AC

## 2018-07-25 MED ORDER — MORPHINE SULFATE 10 MG/5ML PO SOLN: 5.0000 mg | ORAL | 0 refills | Status: DC | PRN

## 2018-07-25 MED ORDER — MORPHINE SULFATE 10 MG/5ML PO SOLN: 5.0000 mg | Freq: Four times a day (QID) | ORAL | 0 refills | Status: DC

## 2018-07-25 MED ORDER — VANCOMYCIN 50 MG/ML ORAL SOLUTION: 125.0000 mg | Freq: Four times a day (QID) | ORAL | 0 refills | Status: AC

## 2018-07-25 MED ORDER — VANCOMYCIN 50 MG/ML ORAL SOLUTION: 125.0000 mg | Freq: Every day | ORAL | 0 refills | Status: AC

## 2018-07-25 MED FILL — MORPHINE SULF 10 MG/5 ML SO: 10 | 3 days supply | Qty: 45 | Fill #0

## 2018-07-25 NOTE — Progress Notes (Signed)
Morphine on home hospice not available at pharmacy, reordered at [facility].  Sister notified on phone.  [facility].  -Dr. Criss Rosales

## 2018-07-25 NOTE — Telephone Encounter (Signed)
Patients sister called nurse line stating the patient was told at discharge to inject 5 units of humalog 3x daily before meals. [name] stated the hospice nurse suggested she didn't need that much since the patient has been eating very little. Please call [name] with directions. Also, requesting test strips to pharmacy.

## 2018-07-25 NOTE — Telephone Encounter (Signed)
Christine Santos returning [place] call. Informed her to stop humalog. She informed me she found test strips and does not need a refill. Christine Santos asked if the dose of lantus is the same as before? She usually t akes 12 units before bed every night. Please advise.

## 2018-07-25 NOTE — Progress Notes (Signed)
Penny Pia to be D/C'd Home per MD order.  Discussed with the patient and all questions fully answered.  VSS, Skin clean, dry and intact without evidence of skin break down, no evidence of skin tears noted. IV catheter discontinued intact. Site without signs and symptoms of complications. Dressing and pressure applied.  An After Visit Summary was printed and given to PTAR. PTAR received patient prescriptions  D/c education completed with patient/family including follow up instructions, medication list, d/c activities limitations if indicated, with other d/c instructions as indicated by MD - patient able to verbalize understanding, all questions fully answered.   Patient instructed to return to ED, call 911, or call MD for any changes in condition.   Patient discharged via PTAR.   Howard Pouch 07/25/2018 8:09 AM

## 2018-07-25 NOTE — Progress Notes (Signed)
Received call from patient's sister [name] stating patient's belonging back including denture (1 row) was left at hospital. Confirmed denture (1 row) in patient's belonging bag, bag labeled and placed in utilities closet. Hassell Done stated would arrange to pick bag up from hospital before noon 7/1. Notified charge RN of patient's bag and planned pick-up.   [name] phone number [phone]

## 2018-07-25 NOTE — Telephone Encounter (Signed)
Called and answered questions

## 2018-07-25 NOTE — Telephone Encounter (Signed)
Called and LM.  Told to stop humalog but continue lantus.  Also needs to call back and let me know the type of diabetic meter she has so that I call in the correct test strips.

## 2018-07-25 NOTE — Progress Notes (Signed)
Christine Santos, the sister of Dhriti Fales, presented to the emergency department at around 10 PM because she was not able to pick up morphine or vancomycin at the pharmacy where Ms. Ontko's medications were sent.  Ms. Clint Guy was sent home with home hospice earlier in the day.  [name], the sister, was trying to find where she could pick up the prescribed medications for [facility] hospice care.  Spoke with pharmacy and we were able to procure 4 doses of p.o. vancomycin and 1 days worth of morphine.  The remainder of her medications were sent to [facility] in [place].  The inpatient team during the day will [facility] during the daytime hours to ensure that medication can be picked up and then contact Christine Santos.

## 2018-07-26 ENCOUNTER — Telehealth: Payer: Self-pay | Admitting: *Deleted

## 2018-07-26 ENCOUNTER — Other Ambulatory Visit: Payer: Self-pay | Admitting: Family Medicine

## 2018-07-26 DIAGNOSIS — Z515 Encounter for palliative care: Secondary | ICD-10-CM

## 2018-07-26 LAB — CULTURE, BLOOD (SINGLE): Culture: NO GROWTH

## 2018-07-26 MED ORDER — MORPHINE SULFATE 10 MG/5ML PO SOLN: ORAL | 0 refills | Status: AC

## 2018-07-26 MED ORDER — MORPHINE SULFATE 10 MG/5ML PO SOLN: 5.0000 mg | Freq: Four times a day (QID) | ORAL | 0 refills | Status: DC

## 2018-07-30 ENCOUNTER — Telehealth: Payer: Self-pay | Admitting: Family Medicine

## 2018-07-30 NOTE — Telephone Encounter (Signed)
Done

## 2018-07-30 NOTE — Telephone Encounter (Signed)
Death Certificate was dropped off and has been placed in PCP's box for completion. Please use blue or black ink, no white out, strike throughs, or highlite. Please return to [place] once completed.

## 2018-08-03 ENCOUNTER — Ambulatory Visit: Payer: Medicare Other | Admitting: Vascular Surgery

## 2018-08-03 ENCOUNTER — Other Ambulatory Visit (HOSPITAL_COMMUNITY): Payer: Medicare Other

## 2018-08-06 ENCOUNTER — Telehealth: Payer: Self-pay | Admitting: *Deleted

## 2018-08-06 NOTE — Telephone Encounter (Signed)
Received telephone call from patient's sister, patient passed away Aug 25, 2022. Future appointments have been cancelled. Sister sends her appreciation to Dr. Alvy Bimler for all her help taking care of her.

## 2018-08-07 ENCOUNTER — Encounter: Payer: Self-pay | Admitting: *Deleted

## 2018-08-07 NOTE — Research (Unsigned)
DEATH CERTIFICATE

## 2018-08-14 ENCOUNTER — Ambulatory Visit: Payer: Medicare Other | Admitting: Podiatry

## 2018-08-25 NOTE — Telephone Encounter (Signed)
Christine Santos called to let Dr. Andria Frames know that the patient passed away last night @ 2:30. Christen Bame, CMA

## 2018-08-25 NOTE — Progress Notes (Signed)
Morphine reordered per verbal conversation with Pharmacist at [facility] long outpatient pharmacy 7/1  -Dr. Criss Rosales

## 2018-08-25 DEATH — deceased

## 2018-09-20 ENCOUNTER — Ambulatory Visit: Payer: Medicare Other | Admitting: Hematology and Oncology

## 2018-09-20 ENCOUNTER — Other Ambulatory Visit: Payer: Medicare Other

## 2018-11-07 IMAGING — CR DG CHEST 2V
2 series · 2 of 2 positions shown · non-contrast
Comparison: 01/04/2017, 12/07/2016

CLINICAL DATA: Flu like symptoms

EXAM:
CHEST - 2 VIEW

[chest pa]
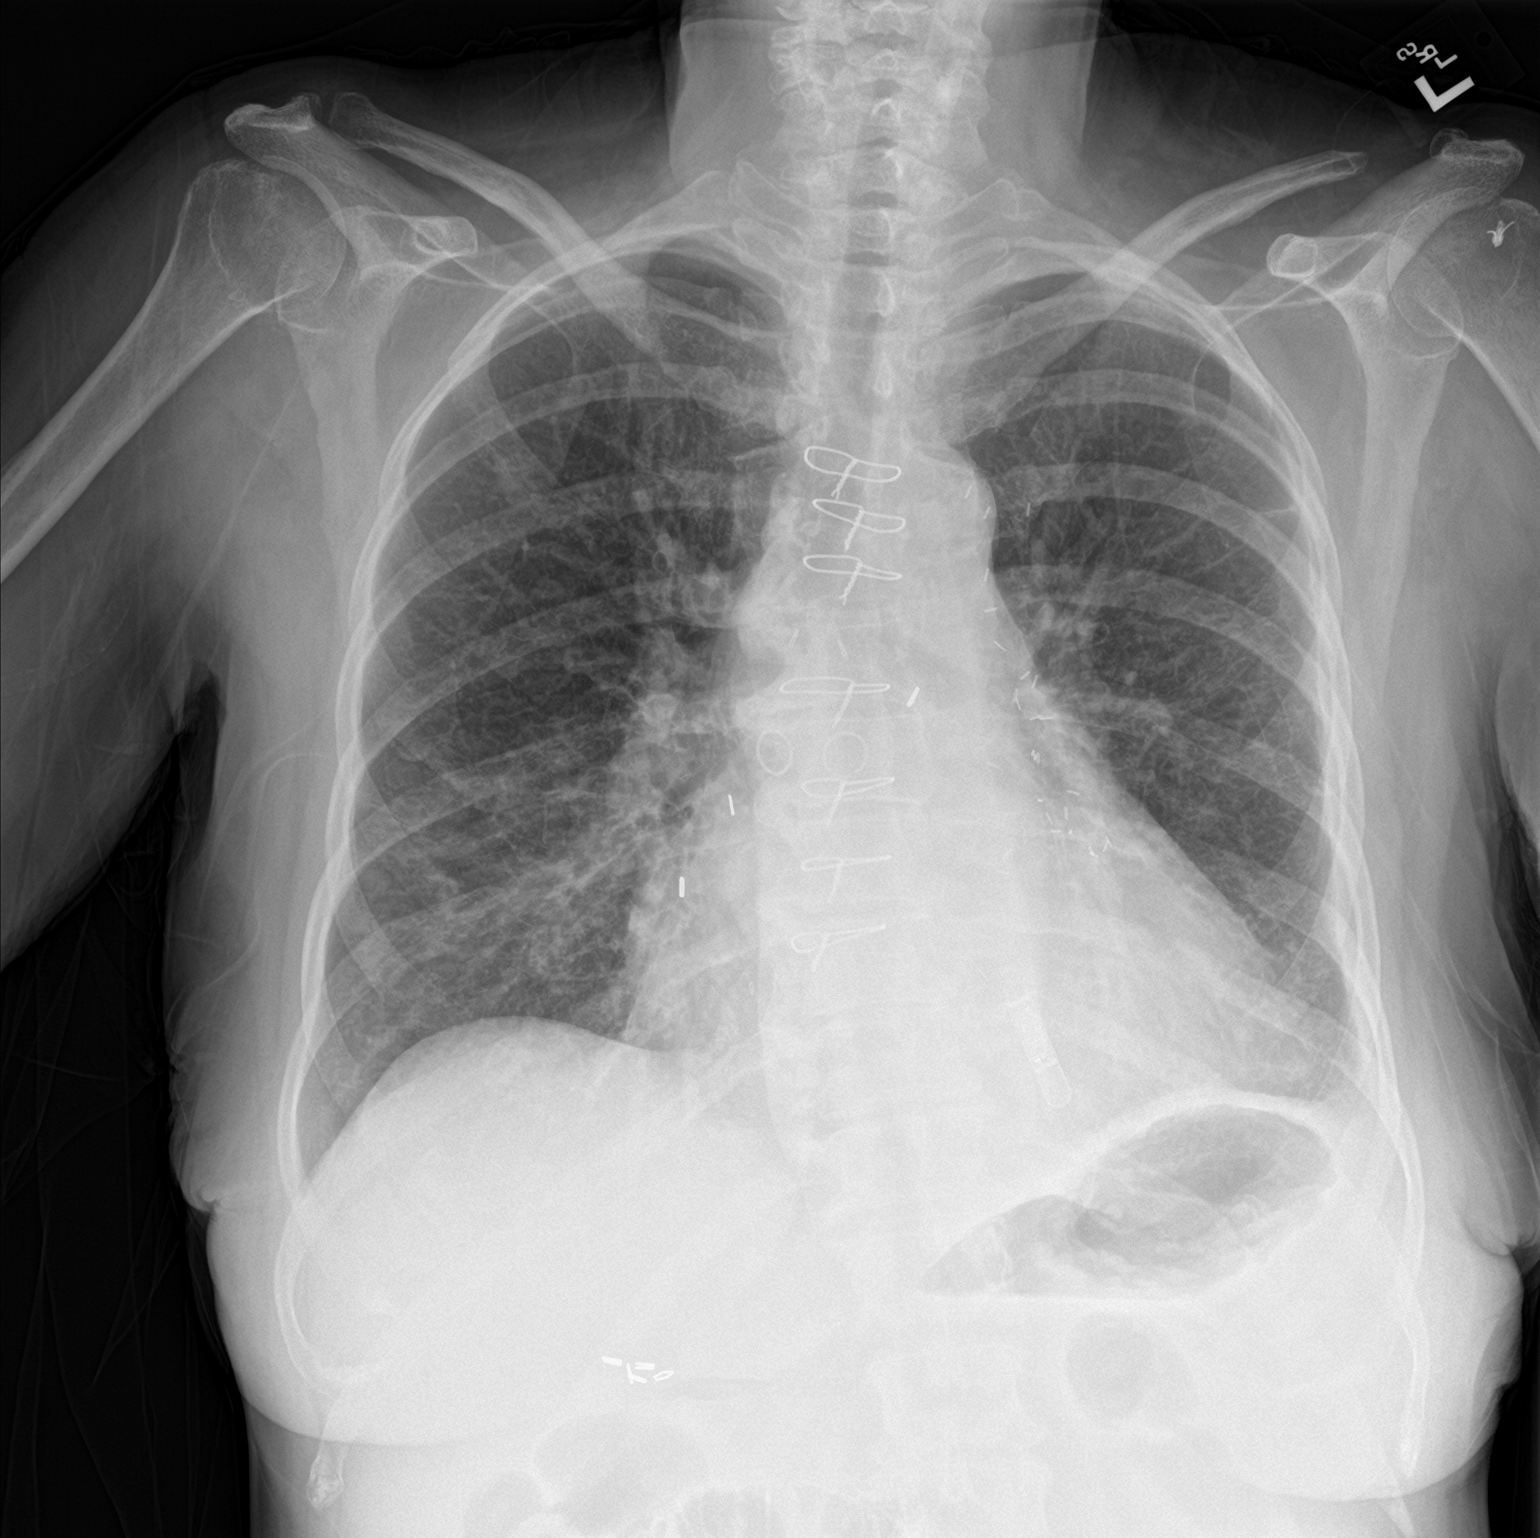

[chest lat]
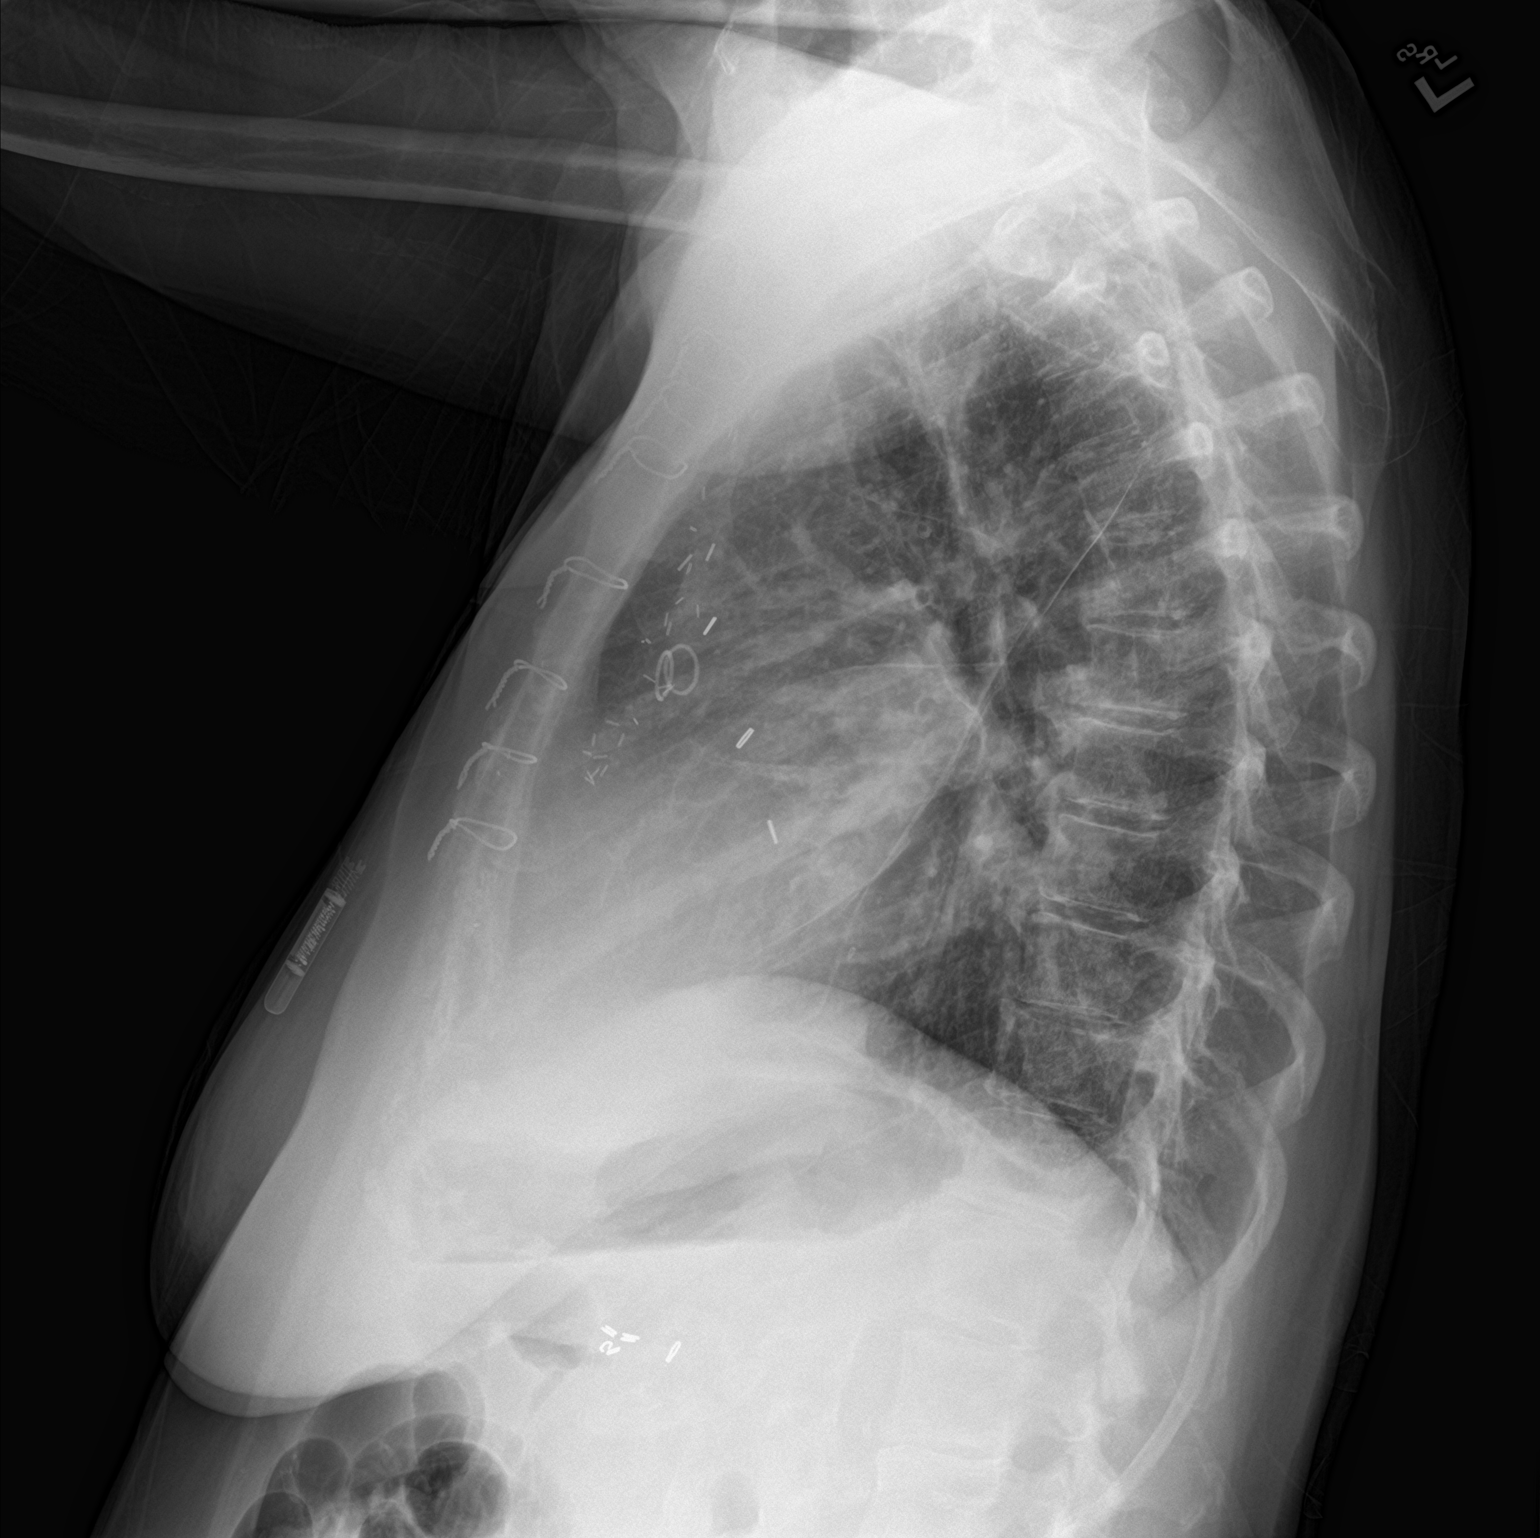

[2 of 2 positions shown; findings below may reference images not displayed]

FINDINGS: Post sternotomy changes. Small left pleural effusion or thickening.
Cardiomegaly. No focal consolidation. Degenerative changes of the
spine. Electronic recording device over the left chest. Surgical
clips in the right upper quadrant.
IMPRESSION: 1. Small left pleural effusion.
2. Cardiomegaly
3. No focal pulmonary infiltrate

## 2019-06-03 IMAGING — US IR US GUIDE VASC ACCESS RIGHT
1 series · 2 of 2 positions shown · non-contrast
Comparison: none

INDICATION: 71-year-old with end-stage renal disease. Patient needs a tunneled
dialysis catheter while the left arm fistula matures. Patient
currently has non tunneled dialysis catheter.

[Series 1: ir us guide vasc access right · 2 of 2 slices shown]
[im 1/2]
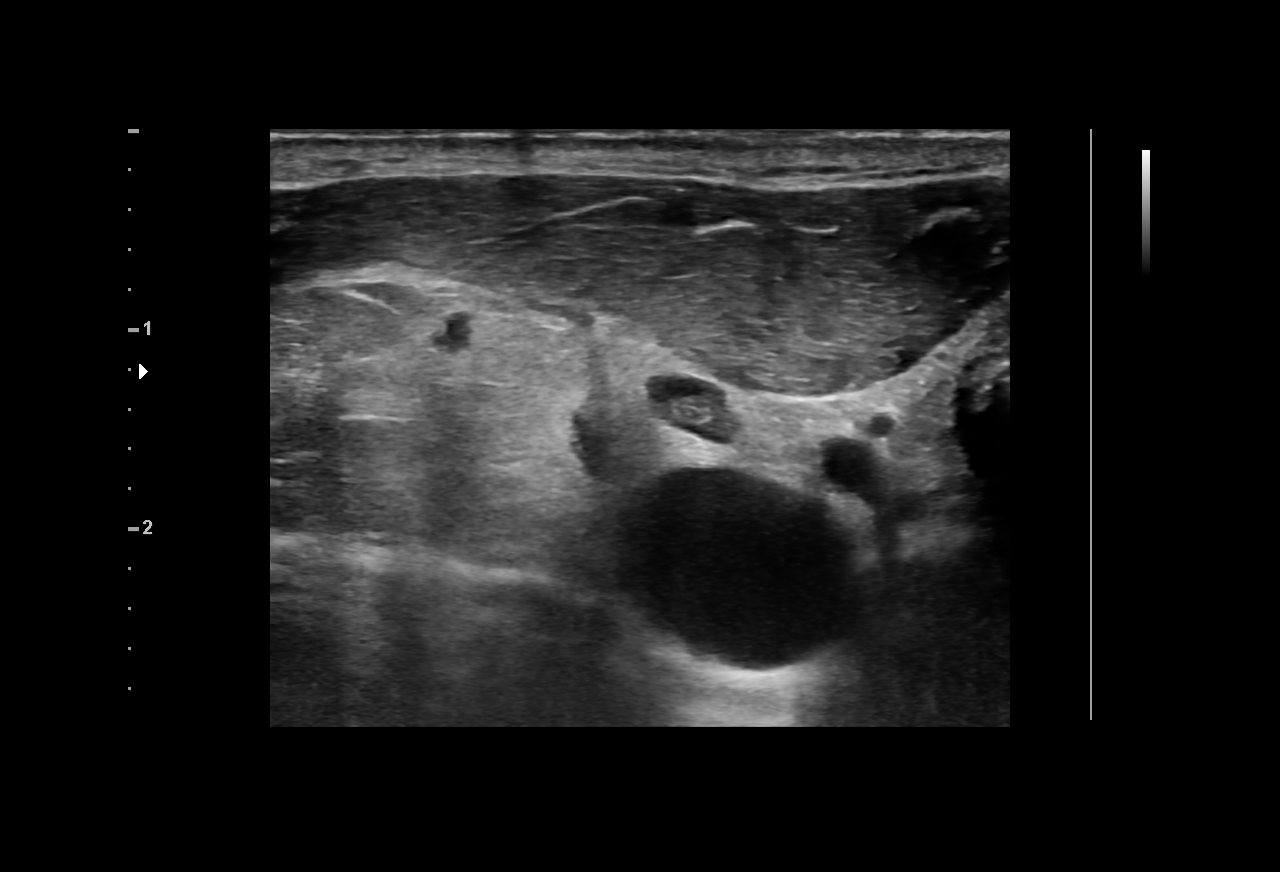
[im 2/2]
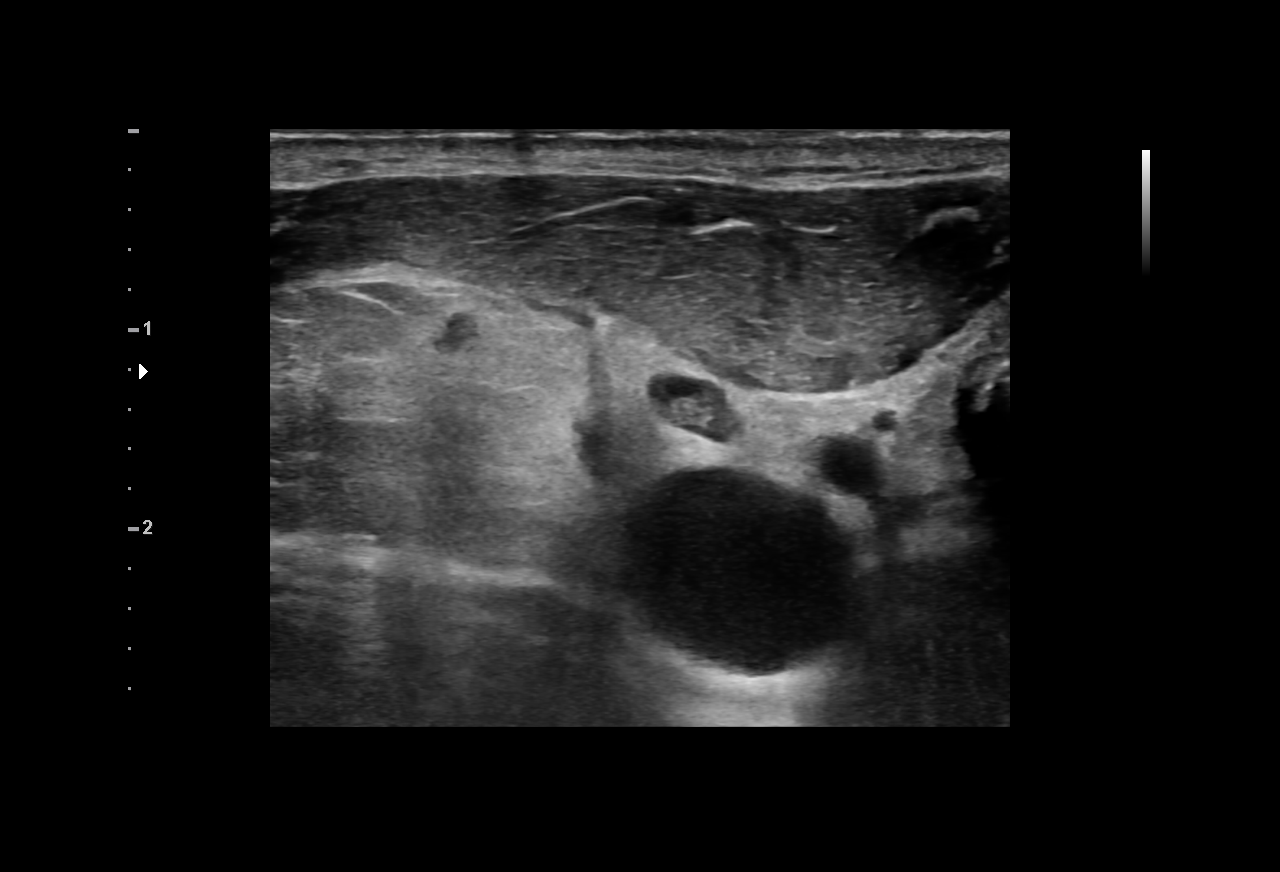

[2 of 2 positions shown; findings below may reference images not displayed]

EXAM:
FLUOROSCOPIC AND ULTRASOUND GUIDED PLACEMENT OF A TUNNELED DIALYSIS
CATHETER

MEDICATIONS:
Vancomycin 1 gm IV; The antibiotic was administered within an
appropriate time interval prior to skin puncture.

ANESTHESIA/SEDATION:
Versed 2 mg IV; Fentanyl 100 mcg IV;

Moderate Sedation Time:  30 minutes

The patient was continuously monitored during the procedure by the
interventional radiology nurse under my direct supervision.

FLUOROSCOPY TIME:  Fluoroscopy Time: 18 seconds, 3 mGy

COMPLICATIONS:
None immediate.

PROCEDURE:
The procedure was explained to the patient. The risks and benefits
of the procedure were discussed and the patient's questions were
addressed. Informed consent was obtained from the patient. The
existing right neck catheter was removed with manual compression.
The patient was placed supine on the interventional table.
Ultrasound confirmed a patent right internal jugular vein.
Ultrasound images were obtained for documentation. The right side of
the neck and chest was prepped and draped in a sterile fashion. The
right neck was anesthetized with 1% lidocaine. Maximal barrier
sterile technique was utilized including caps, mask, sterile gowns,
sterile gloves, sterile drape, hand hygiene and skin antiseptic. A
small incision was made with #11 blade scalpel. A 21 gauge needle
directed into the right internal jugular vein with ultrasound
guidance. A micropuncture dilator set was placed. A 23 cm tip to
cuff Palindrome catheter was selected. The skin below the right
clavicle was anesthetized and a small incision was made with an #11
blade scalpel. A subcutaneous tunnel was formed to the vein
dermatotomy site. The catheter was brought through the tunnel. The
vein dermatotomy site was dilated to accommodate a peel-away sheath.
The catheter was placed through the peel-away sheath and directed
into the central venous structures. The tip of the catheter was
placed at the superior cavoatrial junction with fluoroscopy.
Fluoroscopic images were obtained for documentation. Both lumens
were found to aspirate and flush well. The proper amount of heparin
was flushed in both lumens. The vein dermatotomy site was closed
using a single layer of absorbable suture and Dermabond. The
catheter was secured to the skin using Prolene suture.
IMPRESSION: Successful placement of a right jugular tunneled dialysis catheter
using ultrasound and fluoroscopic guidance.

Catheter tip at the junction of superior vena cava and right atrium.

## 2020-02-16 IMAGING — DX PORTABLE CHEST - 1 VIEW
1 series · 1 of 1 positions shown · non-contrast
Comparison: Prior chest x-ray 06/09/2018

CLINICAL DATA: 72-year-old female with weakness and body aches

EXAM:
PORTABLE CHEST 1 VIEW

[chest ap]
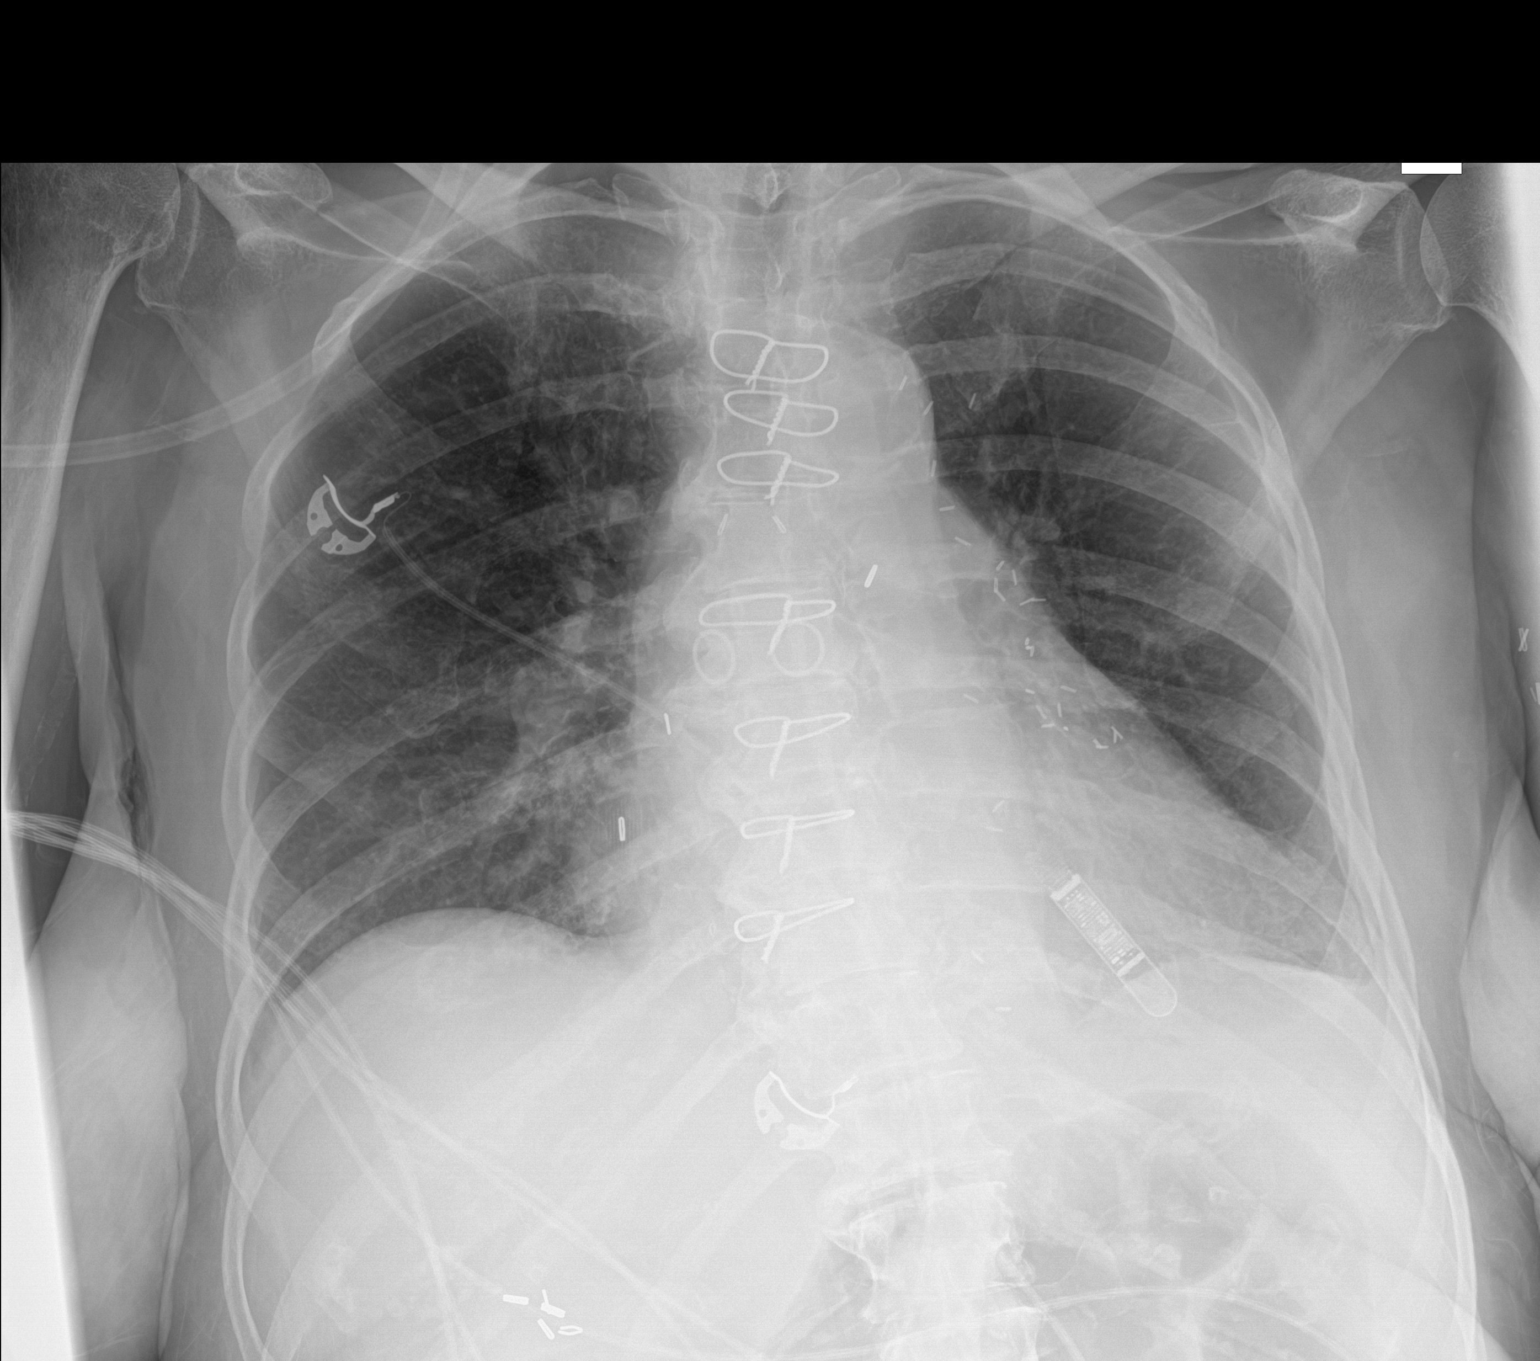

[1 of 1 positions shown; findings below may reference images not displayed]

FINDINGS: Stable cardiac and mediastinal contours. Patient is status post
median sternotomy with evidence of prior multivessel CABG including
[REDACTED] bypass. [REDACTED] implantable loop recorder projects over the
left chest. The lungs are clear. No pulmonary edema or pneumothorax.
Small left pleural effusion, unchanged. No acute osseous
abnormality.
IMPRESSION: 1. Stable chest x-ray without evidence of acute cardiopulmonary
process.
2. Stable cardiomegaly status post multivessel CABG.
3. Stable small left pleural effusion.
